# Patient Record
Sex: Male | Born: 1935 | Race: White | Hispanic: No | Marital: Married | Smoking: Never smoker
Health system: Southern US, Community
[De-identification: ages and names within clinical notes are randomized; demographics above are authoritative.]

## PROBLEM LIST (undated history)

## (undated) DIAGNOSIS — Z9889 Other specified postprocedural states: Secondary | ICD-10-CM

## (undated) DIAGNOSIS — G250 Essential tremor: Secondary | ICD-10-CM

## (undated) DIAGNOSIS — C61 Malignant neoplasm of prostate: Secondary | ICD-10-CM

## (undated) DIAGNOSIS — I10 Essential (primary) hypertension: Secondary | ICD-10-CM

## (undated) DIAGNOSIS — S7290XA Unspecified fracture of unspecified femur, initial encounter for closed fracture: Secondary | ICD-10-CM

## (undated) DIAGNOSIS — Z9289 Personal history of other medical treatment: Secondary | ICD-10-CM

## (undated) DIAGNOSIS — E7800 Pure hypercholesterolemia, unspecified: Secondary | ICD-10-CM

## (undated) DIAGNOSIS — G629 Polyneuropathy, unspecified: Secondary | ICD-10-CM

## (undated) DIAGNOSIS — I219 Acute myocardial infarction, unspecified: Secondary | ICD-10-CM

## (undated) DIAGNOSIS — M1990 Unspecified osteoarthritis, unspecified site: Secondary | ICD-10-CM

## (undated) DIAGNOSIS — K219 Gastro-esophageal reflux disease without esophagitis: Secondary | ICD-10-CM

## (undated) DIAGNOSIS — T148XXA Other injury of unspecified body region, initial encounter: Secondary | ICD-10-CM

## (undated) HISTORY — DX: Essential tremor: G25.0

## (undated) HISTORY — PX: BASAL CELL CARCINOMA EXCISION: SHX1214

## (undated) HISTORY — DX: Polyneuropathy, unspecified: G62.9

## (undated) HISTORY — DX: Gastro-esophageal reflux disease without esophagitis: K21.9

## (undated) HISTORY — DX: Other specified postprocedural states: Z98.890

## (undated) HISTORY — DX: Unspecified osteoarthritis, unspecified site: M19.90

## (undated) HISTORY — DX: Pure hypercholesterolemia, unspecified: E78.00

## (undated) HISTORY — DX: Other injury of unspecified body region, initial encounter: T14.8XXA

## (undated) HISTORY — DX: Unspecified fracture of unspecified femur, initial encounter for closed fracture: S72.90XA

## (undated) HISTORY — DX: Personal history of other medical treatment: Z92.89

## (undated) HISTORY — DX: Malignant neoplasm of prostate: C61

---

## 1985-06-13 HISTORY — PX: CORONARY ARTERY BYPASS GRAFT: SHX141

## 1988-06-13 HISTORY — PX: HERNIA REPAIR: SHX51

## 2002-06-13 DIAGNOSIS — C61 Malignant neoplasm of prostate: Secondary | ICD-10-CM

## 2002-06-13 HISTORY — PX: PROSTATE SURGERY: SHX751

## 2002-06-13 HISTORY — DX: Malignant neoplasm of prostate: C61

## 2006-09-25 ENCOUNTER — Other Ambulatory Visit: Payer: Self-pay

## 2006-09-25 ENCOUNTER — Ambulatory Visit: Payer: Self-pay | Admitting: Podiatry

## 2006-09-27 ENCOUNTER — Ambulatory Visit: Payer: Self-pay | Admitting: Podiatry

## 2006-12-09 ENCOUNTER — Other Ambulatory Visit: Payer: Self-pay

## 2006-12-09 ENCOUNTER — Emergency Department: Payer: Self-pay | Admitting: Emergency Medicine

## 2009-05-01 ENCOUNTER — Ambulatory Visit: Payer: Self-pay | Admitting: General Surgery

## 2009-05-01 HISTORY — PX: COLONOSCOPY: SHX174

## 2010-06-13 DIAGNOSIS — S7290XA Unspecified fracture of unspecified femur, initial encounter for closed fracture: Secondary | ICD-10-CM

## 2010-06-13 HISTORY — DX: Unspecified fracture of unspecified femur, initial encounter for closed fracture: S72.90XA

## 2010-09-28 ENCOUNTER — Ambulatory Visit: Payer: Self-pay | Admitting: Internal Medicine

## 2011-06-14 DIAGNOSIS — R262 Difficulty in walking, not elsewhere classified: Secondary | ICD-10-CM | POA: Diagnosis not present

## 2011-06-14 DIAGNOSIS — M216X9 Other acquired deformities of unspecified foot: Secondary | ICD-10-CM | POA: Diagnosis not present

## 2011-06-16 DIAGNOSIS — R262 Difficulty in walking, not elsewhere classified: Secondary | ICD-10-CM | POA: Diagnosis not present

## 2011-06-16 DIAGNOSIS — M216X9 Other acquired deformities of unspecified foot: Secondary | ICD-10-CM | POA: Diagnosis not present

## 2011-06-20 DIAGNOSIS — R262 Difficulty in walking, not elsewhere classified: Secondary | ICD-10-CM | POA: Diagnosis not present

## 2011-06-20 DIAGNOSIS — M216X9 Other acquired deformities of unspecified foot: Secondary | ICD-10-CM | POA: Diagnosis not present

## 2011-06-22 DIAGNOSIS — R262 Difficulty in walking, not elsewhere classified: Secondary | ICD-10-CM | POA: Diagnosis not present

## 2011-06-22 DIAGNOSIS — M216X9 Other acquired deformities of unspecified foot: Secondary | ICD-10-CM | POA: Diagnosis not present

## 2011-06-29 DIAGNOSIS — M216X9 Other acquired deformities of unspecified foot: Secondary | ICD-10-CM | POA: Diagnosis not present

## 2011-06-29 DIAGNOSIS — R262 Difficulty in walking, not elsewhere classified: Secondary | ICD-10-CM | POA: Diagnosis not present

## 2011-07-01 DIAGNOSIS — M216X9 Other acquired deformities of unspecified foot: Secondary | ICD-10-CM | POA: Diagnosis not present

## 2011-07-01 DIAGNOSIS — R262 Difficulty in walking, not elsewhere classified: Secondary | ICD-10-CM | POA: Diagnosis not present

## 2011-07-04 DIAGNOSIS — R262 Difficulty in walking, not elsewhere classified: Secondary | ICD-10-CM | POA: Diagnosis not present

## 2011-07-04 DIAGNOSIS — M216X9 Other acquired deformities of unspecified foot: Secondary | ICD-10-CM | POA: Diagnosis not present

## 2011-07-08 DIAGNOSIS — M216X9 Other acquired deformities of unspecified foot: Secondary | ICD-10-CM | POA: Diagnosis not present

## 2011-07-08 DIAGNOSIS — R262 Difficulty in walking, not elsewhere classified: Secondary | ICD-10-CM | POA: Diagnosis not present

## 2011-07-15 DIAGNOSIS — M216X9 Other acquired deformities of unspecified foot: Secondary | ICD-10-CM | POA: Diagnosis not present

## 2011-07-15 DIAGNOSIS — R262 Difficulty in walking, not elsewhere classified: Secondary | ICD-10-CM | POA: Diagnosis not present

## 2011-08-05 DIAGNOSIS — M216X9 Other acquired deformities of unspecified foot: Secondary | ICD-10-CM | POA: Diagnosis not present

## 2011-08-05 DIAGNOSIS — R262 Difficulty in walking, not elsewhere classified: Secondary | ICD-10-CM | POA: Diagnosis not present

## 2011-08-10 DIAGNOSIS — L570 Actinic keratosis: Secondary | ICD-10-CM | POA: Diagnosis not present

## 2011-08-10 DIAGNOSIS — Z85828 Personal history of other malignant neoplasm of skin: Secondary | ICD-10-CM | POA: Diagnosis not present

## 2011-08-10 DIAGNOSIS — L821 Other seborrheic keratosis: Secondary | ICD-10-CM | POA: Diagnosis not present

## 2011-08-10 DIAGNOSIS — D485 Neoplasm of uncertain behavior of skin: Secondary | ICD-10-CM | POA: Diagnosis not present

## 2011-09-09 DIAGNOSIS — Z8781 Personal history of (healed) traumatic fracture: Secondary | ICD-10-CM | POA: Insufficient documentation

## 2011-09-09 DIAGNOSIS — M25579 Pain in unspecified ankle and joints of unspecified foot: Secondary | ICD-10-CM | POA: Diagnosis not present

## 2011-09-09 DIAGNOSIS — S93409A Sprain of unspecified ligament of unspecified ankle, initial encounter: Secondary | ICD-10-CM | POA: Insufficient documentation

## 2011-10-20 DIAGNOSIS — I10 Essential (primary) hypertension: Secondary | ICD-10-CM | POA: Diagnosis not present

## 2011-10-20 DIAGNOSIS — K3189 Other diseases of stomach and duodenum: Secondary | ICD-10-CM | POA: Diagnosis not present

## 2011-10-20 DIAGNOSIS — R1013 Epigastric pain: Secondary | ICD-10-CM | POA: Diagnosis not present

## 2011-10-20 DIAGNOSIS — E782 Mixed hyperlipidemia: Secondary | ICD-10-CM | POA: Diagnosis not present

## 2011-10-20 DIAGNOSIS — I2510 Atherosclerotic heart disease of native coronary artery without angina pectoris: Secondary | ICD-10-CM | POA: Diagnosis not present

## 2011-10-31 DIAGNOSIS — Z79899 Other long term (current) drug therapy: Secondary | ICD-10-CM | POA: Diagnosis not present

## 2011-10-31 DIAGNOSIS — G589 Mononeuropathy, unspecified: Secondary | ICD-10-CM | POA: Diagnosis not present

## 2011-10-31 DIAGNOSIS — I2510 Atherosclerotic heart disease of native coronary artery without angina pectoris: Secondary | ICD-10-CM | POA: Diagnosis not present

## 2011-10-31 DIAGNOSIS — Z0000 Encounter for general adult medical examination without abnormal findings: Secondary | ICD-10-CM | POA: Diagnosis not present

## 2011-10-31 DIAGNOSIS — C61 Malignant neoplasm of prostate: Secondary | ICD-10-CM | POA: Diagnosis not present

## 2011-11-02 DIAGNOSIS — L570 Actinic keratosis: Secondary | ICD-10-CM | POA: Diagnosis not present

## 2011-11-10 ENCOUNTER — Ambulatory Visit: Payer: Self-pay | Admitting: Oncology

## 2011-12-07 ENCOUNTER — Ambulatory Visit: Payer: Self-pay | Admitting: Oncology

## 2011-12-07 DIAGNOSIS — Z951 Presence of aortocoronary bypass graft: Secondary | ICD-10-CM | POA: Diagnosis not present

## 2011-12-07 DIAGNOSIS — G589 Mononeuropathy, unspecified: Secondary | ICD-10-CM | POA: Diagnosis not present

## 2011-12-07 DIAGNOSIS — I2510 Atherosclerotic heart disease of native coronary artery without angina pectoris: Secondary | ICD-10-CM | POA: Diagnosis not present

## 2011-12-07 DIAGNOSIS — Z79899 Other long term (current) drug therapy: Secondary | ICD-10-CM | POA: Diagnosis not present

## 2011-12-07 DIAGNOSIS — D649 Anemia, unspecified: Secondary | ICD-10-CM | POA: Diagnosis not present

## 2011-12-07 DIAGNOSIS — Z8546 Personal history of malignant neoplasm of prostate: Secondary | ICD-10-CM | POA: Diagnosis not present

## 2011-12-07 DIAGNOSIS — E7800 Pure hypercholesterolemia, unspecified: Secondary | ICD-10-CM | POA: Diagnosis not present

## 2011-12-07 DIAGNOSIS — D72819 Decreased white blood cell count, unspecified: Secondary | ICD-10-CM | POA: Diagnosis not present

## 2011-12-07 DIAGNOSIS — Z7982 Long term (current) use of aspirin: Secondary | ICD-10-CM | POA: Diagnosis not present

## 2011-12-07 DIAGNOSIS — I10 Essential (primary) hypertension: Secondary | ICD-10-CM | POA: Diagnosis not present

## 2011-12-07 LAB — CBC CANCER CENTER
Basophil #: 0 x10 3/mm (ref 0.0–0.1)
Basophil %: 0.9 %
Eosinophil #: 0.1 x10 3/mm (ref 0.0–0.7)
HCT: 31.3 % — ABNORMAL LOW (ref 40.0–52.0)
Lymphocyte #: 1 x10 3/mm (ref 1.0–3.6)
MCH: 36.7 pg — ABNORMAL HIGH (ref 26.0–34.0)
MCV: 107 fL — ABNORMAL HIGH (ref 80–100)
Monocyte #: 0.4 x10 3/mm (ref 0.2–1.0)
Neutrophil #: 1.6 x10 3/mm (ref 1.4–6.5)
Neutrophil %: 52.4 %
RDW: 14.7 % — ABNORMAL HIGH (ref 11.5–14.5)
WBC: 3.1 x10 3/mm — ABNORMAL LOW (ref 3.8–10.6)

## 2011-12-07 LAB — LACTATE DEHYDROGENASE: LDH: 125 U/L (ref 87–241)

## 2011-12-07 LAB — RETICULOCYTES
Absolute Retic Count: 0.075 10*6/uL (ref 0.024–0.084)
Reticulocyte: 2.6 % — ABNORMAL HIGH (ref 0.5–1.5)

## 2011-12-07 LAB — SEDIMENTATION RATE: Erythrocyte Sed Rate: 29 mm/hr — ABNORMAL HIGH (ref 0–20)

## 2011-12-08 LAB — PROT IMMUNOELECTROPHORES(ARMC)

## 2011-12-12 ENCOUNTER — Ambulatory Visit: Payer: Self-pay | Admitting: Oncology

## 2011-12-12 DIAGNOSIS — R259 Unspecified abnormal involuntary movements: Secondary | ICD-10-CM | POA: Diagnosis not present

## 2011-12-12 DIAGNOSIS — G589 Mononeuropathy, unspecified: Secondary | ICD-10-CM | POA: Diagnosis not present

## 2011-12-12 DIAGNOSIS — D72819 Decreased white blood cell count, unspecified: Secondary | ICD-10-CM | POA: Diagnosis not present

## 2011-12-12 DIAGNOSIS — Z79899 Other long term (current) drug therapy: Secondary | ICD-10-CM | POA: Diagnosis not present

## 2011-12-12 DIAGNOSIS — E7800 Pure hypercholesterolemia, unspecified: Secondary | ICD-10-CM | POA: Diagnosis not present

## 2011-12-12 DIAGNOSIS — Z951 Presence of aortocoronary bypass graft: Secondary | ICD-10-CM | POA: Diagnosis not present

## 2011-12-12 DIAGNOSIS — D649 Anemia, unspecified: Secondary | ICD-10-CM | POA: Diagnosis not present

## 2011-12-12 DIAGNOSIS — I10 Essential (primary) hypertension: Secondary | ICD-10-CM | POA: Diagnosis not present

## 2011-12-12 DIAGNOSIS — Z8546 Personal history of malignant neoplasm of prostate: Secondary | ICD-10-CM | POA: Diagnosis not present

## 2011-12-12 DIAGNOSIS — I2510 Atherosclerotic heart disease of native coronary artery without angina pectoris: Secondary | ICD-10-CM | POA: Diagnosis not present

## 2011-12-12 DIAGNOSIS — Z7982 Long term (current) use of aspirin: Secondary | ICD-10-CM | POA: Diagnosis not present

## 2012-01-04 DIAGNOSIS — D72819 Decreased white blood cell count, unspecified: Secondary | ICD-10-CM | POA: Diagnosis not present

## 2012-01-04 DIAGNOSIS — D649 Anemia, unspecified: Secondary | ICD-10-CM | POA: Diagnosis not present

## 2012-01-04 LAB — CBC CANCER CENTER
Basophil #: 0 x10 3/mm (ref 0.0–0.1)
Basophil %: 0.6 %
Eosinophil #: 0.1 x10 3/mm (ref 0.0–0.7)
Eosinophil %: 2.7 %
HCT: 34.5 % — ABNORMAL LOW (ref 40.0–52.0)
HGB: 11.9 g/dL — ABNORMAL LOW (ref 13.0–18.0)
Lymphocyte #: 1.2 x10 3/mm (ref 1.0–3.6)
Lymphocyte %: 31.8 %
MCH: 36.8 pg — ABNORMAL HIGH (ref 26.0–34.0)
MCHC: 34.5 g/dL (ref 32.0–36.0)
MCV: 107 fL — ABNORMAL HIGH (ref 80–100)
Monocyte #: 0.4 x10 3/mm (ref 0.2–1.0)
Neutrophil #: 2 x10 3/mm (ref 1.4–6.5)
RDW: 13.2 % (ref 11.5–14.5)
WBC: 3.6 x10 3/mm — ABNORMAL LOW (ref 3.8–10.6)

## 2012-01-12 ENCOUNTER — Ambulatory Visit: Payer: Self-pay | Admitting: Oncology

## 2012-01-26 DIAGNOSIS — L820 Inflamed seborrheic keratosis: Secondary | ICD-10-CM | POA: Diagnosis not present

## 2012-01-26 DIAGNOSIS — D485 Neoplasm of uncertain behavior of skin: Secondary | ICD-10-CM | POA: Diagnosis not present

## 2012-01-26 DIAGNOSIS — Z85828 Personal history of other malignant neoplasm of skin: Secondary | ICD-10-CM | POA: Diagnosis not present

## 2012-01-26 DIAGNOSIS — D235 Other benign neoplasm of skin of trunk: Secondary | ICD-10-CM | POA: Diagnosis not present

## 2012-01-26 DIAGNOSIS — L570 Actinic keratosis: Secondary | ICD-10-CM | POA: Diagnosis not present

## 2012-02-01 DIAGNOSIS — H2510 Age-related nuclear cataract, unspecified eye: Secondary | ICD-10-CM | POA: Diagnosis not present

## 2012-03-06 DIAGNOSIS — Z23 Encounter for immunization: Secondary | ICD-10-CM | POA: Diagnosis not present

## 2012-04-05 DIAGNOSIS — L570 Actinic keratosis: Secondary | ICD-10-CM | POA: Diagnosis not present

## 2012-04-05 DIAGNOSIS — Z85828 Personal history of other malignant neoplasm of skin: Secondary | ICD-10-CM | POA: Diagnosis not present

## 2012-04-06 ENCOUNTER — Ambulatory Visit: Payer: Self-pay | Admitting: Internal Medicine

## 2012-04-06 DIAGNOSIS — M79609 Pain in unspecified limb: Secondary | ICD-10-CM | POA: Diagnosis not present

## 2012-04-06 DIAGNOSIS — M7730 Calcaneal spur, unspecified foot: Secondary | ICD-10-CM | POA: Diagnosis not present

## 2012-04-27 DIAGNOSIS — R5381 Other malaise: Secondary | ICD-10-CM | POA: Diagnosis not present

## 2012-04-27 DIAGNOSIS — R0989 Other specified symptoms and signs involving the circulatory and respiratory systems: Secondary | ICD-10-CM | POA: Diagnosis not present

## 2012-04-27 DIAGNOSIS — J019 Acute sinusitis, unspecified: Secondary | ICD-10-CM | POA: Diagnosis not present

## 2012-04-30 DIAGNOSIS — G589 Mononeuropathy, unspecified: Secondary | ICD-10-CM | POA: Diagnosis not present

## 2012-04-30 DIAGNOSIS — J209 Acute bronchitis, unspecified: Secondary | ICD-10-CM | POA: Diagnosis not present

## 2012-04-30 DIAGNOSIS — R259 Unspecified abnormal involuntary movements: Secondary | ICD-10-CM | POA: Diagnosis not present

## 2012-05-08 DIAGNOSIS — E782 Mixed hyperlipidemia: Secondary | ICD-10-CM | POA: Diagnosis not present

## 2012-05-08 DIAGNOSIS — R9430 Abnormal result of cardiovascular function study, unspecified: Secondary | ICD-10-CM | POA: Diagnosis not present

## 2012-05-08 DIAGNOSIS — I2510 Atherosclerotic heart disease of native coronary artery without angina pectoris: Secondary | ICD-10-CM | POA: Diagnosis not present

## 2012-05-08 DIAGNOSIS — I059 Rheumatic mitral valve disease, unspecified: Secondary | ICD-10-CM | POA: Diagnosis not present

## 2012-05-16 DIAGNOSIS — J40 Bronchitis, not specified as acute or chronic: Secondary | ICD-10-CM | POA: Diagnosis not present

## 2012-05-16 DIAGNOSIS — R0982 Postnasal drip: Secondary | ICD-10-CM | POA: Diagnosis not present

## 2012-05-22 DIAGNOSIS — H9319 Tinnitus, unspecified ear: Secondary | ICD-10-CM | POA: Diagnosis not present

## 2012-05-22 DIAGNOSIS — H903 Sensorineural hearing loss, bilateral: Secondary | ICD-10-CM | POA: Diagnosis not present

## 2012-05-29 DIAGNOSIS — M25559 Pain in unspecified hip: Secondary | ICD-10-CM | POA: Diagnosis not present

## 2012-05-29 DIAGNOSIS — M5137 Other intervertebral disc degeneration, lumbosacral region: Secondary | ICD-10-CM | POA: Diagnosis not present

## 2012-05-29 DIAGNOSIS — M999 Biomechanical lesion, unspecified: Secondary | ICD-10-CM | POA: Diagnosis not present

## 2012-06-11 DIAGNOSIS — I209 Angina pectoris, unspecified: Secondary | ICD-10-CM | POA: Diagnosis not present

## 2012-08-13 DIAGNOSIS — G589 Mononeuropathy, unspecified: Secondary | ICD-10-CM | POA: Diagnosis not present

## 2012-08-13 DIAGNOSIS — D649 Anemia, unspecified: Secondary | ICD-10-CM | POA: Diagnosis not present

## 2012-08-13 DIAGNOSIS — R7989 Other specified abnormal findings of blood chemistry: Secondary | ICD-10-CM | POA: Diagnosis not present

## 2012-08-13 DIAGNOSIS — R5381 Other malaise: Secondary | ICD-10-CM | POA: Diagnosis not present

## 2012-10-16 DIAGNOSIS — D235 Other benign neoplasm of skin of trunk: Secondary | ICD-10-CM | POA: Diagnosis not present

## 2012-10-16 DIAGNOSIS — Z85828 Personal history of other malignant neoplasm of skin: Secondary | ICD-10-CM | POA: Diagnosis not present

## 2012-10-16 DIAGNOSIS — L570 Actinic keratosis: Secondary | ICD-10-CM | POA: Diagnosis not present

## 2012-10-16 DIAGNOSIS — L820 Inflamed seborrheic keratosis: Secondary | ICD-10-CM | POA: Diagnosis not present

## 2012-10-17 DIAGNOSIS — I2510 Atherosclerotic heart disease of native coronary artery without angina pectoris: Secondary | ICD-10-CM | POA: Diagnosis not present

## 2012-10-17 DIAGNOSIS — I119 Hypertensive heart disease without heart failure: Secondary | ICD-10-CM | POA: Diagnosis not present

## 2012-10-17 DIAGNOSIS — E782 Mixed hyperlipidemia: Secondary | ICD-10-CM | POA: Diagnosis not present

## 2012-10-18 DIAGNOSIS — M461 Sacroiliitis, not elsewhere classified: Secondary | ICD-10-CM | POA: Diagnosis not present

## 2012-10-31 DIAGNOSIS — E785 Hyperlipidemia, unspecified: Secondary | ICD-10-CM | POA: Diagnosis not present

## 2012-10-31 DIAGNOSIS — M216X9 Other acquired deformities of unspecified foot: Secondary | ICD-10-CM | POA: Diagnosis not present

## 2012-10-31 DIAGNOSIS — G609 Hereditary and idiopathic neuropathy, unspecified: Secondary | ICD-10-CM | POA: Diagnosis not present

## 2012-10-31 DIAGNOSIS — C61 Malignant neoplasm of prostate: Secondary | ICD-10-CM | POA: Diagnosis not present

## 2012-10-31 DIAGNOSIS — K219 Gastro-esophageal reflux disease without esophagitis: Secondary | ICD-10-CM | POA: Diagnosis not present

## 2012-10-31 DIAGNOSIS — Z0000 Encounter for general adult medical examination without abnormal findings: Secondary | ICD-10-CM | POA: Diagnosis not present

## 2012-10-31 DIAGNOSIS — G589 Mononeuropathy, unspecified: Secondary | ICD-10-CM | POA: Diagnosis not present

## 2012-10-31 DIAGNOSIS — I10 Essential (primary) hypertension: Secondary | ICD-10-CM | POA: Diagnosis not present

## 2012-12-04 DIAGNOSIS — M216X9 Other acquired deformities of unspecified foot: Secondary | ICD-10-CM | POA: Diagnosis not present

## 2013-03-04 DIAGNOSIS — D485 Neoplasm of uncertain behavior of skin: Secondary | ICD-10-CM | POA: Diagnosis not present

## 2013-03-04 DIAGNOSIS — L570 Actinic keratosis: Secondary | ICD-10-CM | POA: Diagnosis not present

## 2013-03-04 DIAGNOSIS — Z85828 Personal history of other malignant neoplasm of skin: Secondary | ICD-10-CM | POA: Diagnosis not present

## 2013-03-04 DIAGNOSIS — C44319 Basal cell carcinoma of skin of other parts of face: Secondary | ICD-10-CM | POA: Diagnosis not present

## 2013-03-04 DIAGNOSIS — L821 Other seborrheic keratosis: Secondary | ICD-10-CM | POA: Diagnosis not present

## 2013-03-04 DIAGNOSIS — L820 Inflamed seborrheic keratosis: Secondary | ICD-10-CM | POA: Diagnosis not present

## 2013-03-05 DIAGNOSIS — Z23 Encounter for immunization: Secondary | ICD-10-CM | POA: Diagnosis not present

## 2013-03-13 DIAGNOSIS — H2510 Age-related nuclear cataract, unspecified eye: Secondary | ICD-10-CM | POA: Diagnosis not present

## 2013-03-14 DIAGNOSIS — C44319 Basal cell carcinoma of skin of other parts of face: Secondary | ICD-10-CM | POA: Diagnosis not present

## 2013-03-14 DIAGNOSIS — L570 Actinic keratosis: Secondary | ICD-10-CM | POA: Diagnosis not present

## 2013-03-27 DIAGNOSIS — Z7189 Other specified counseling: Secondary | ICD-10-CM | POA: Diagnosis not present

## 2013-03-27 DIAGNOSIS — E7800 Pure hypercholesterolemia, unspecified: Secondary | ICD-10-CM | POA: Diagnosis not present

## 2013-03-27 DIAGNOSIS — I519 Heart disease, unspecified: Secondary | ICD-10-CM | POA: Diagnosis not present

## 2013-03-27 DIAGNOSIS — K219 Gastro-esophageal reflux disease without esophagitis: Secondary | ICD-10-CM | POA: Diagnosis not present

## 2013-05-08 DIAGNOSIS — I2510 Atherosclerotic heart disease of native coronary artery without angina pectoris: Secondary | ICD-10-CM | POA: Diagnosis not present

## 2013-05-08 DIAGNOSIS — E782 Mixed hyperlipidemia: Secondary | ICD-10-CM | POA: Diagnosis not present

## 2013-05-08 DIAGNOSIS — I059 Rheumatic mitral valve disease, unspecified: Secondary | ICD-10-CM | POA: Diagnosis not present

## 2013-05-08 DIAGNOSIS — I517 Cardiomegaly: Secondary | ICD-10-CM | POA: Diagnosis not present

## 2013-05-16 DIAGNOSIS — I25810 Atherosclerosis of coronary artery bypass graft(s) without angina pectoris: Secondary | ICD-10-CM | POA: Diagnosis not present

## 2013-05-16 DIAGNOSIS — R0602 Shortness of breath: Secondary | ICD-10-CM | POA: Diagnosis not present

## 2013-05-16 DIAGNOSIS — I38 Endocarditis, valve unspecified: Secondary | ICD-10-CM | POA: Diagnosis not present

## 2013-05-16 DIAGNOSIS — I519 Heart disease, unspecified: Secondary | ICD-10-CM | POA: Diagnosis not present

## 2013-05-21 DIAGNOSIS — I519 Heart disease, unspecified: Secondary | ICD-10-CM | POA: Diagnosis not present

## 2013-05-21 DIAGNOSIS — M129 Arthropathy, unspecified: Secondary | ICD-10-CM | POA: Diagnosis not present

## 2013-05-21 DIAGNOSIS — K219 Gastro-esophageal reflux disease without esophagitis: Secondary | ICD-10-CM | POA: Diagnosis not present

## 2013-05-21 DIAGNOSIS — E7800 Pure hypercholesterolemia, unspecified: Secondary | ICD-10-CM | POA: Diagnosis not present

## 2013-05-31 DIAGNOSIS — R109 Unspecified abdominal pain: Secondary | ICD-10-CM | POA: Diagnosis not present

## 2013-05-31 DIAGNOSIS — N390 Urinary tract infection, site not specified: Secondary | ICD-10-CM | POA: Diagnosis not present

## 2013-05-31 DIAGNOSIS — J111 Influenza due to unidentified influenza virus with other respiratory manifestations: Secondary | ICD-10-CM | POA: Diagnosis not present

## 2013-06-15 DIAGNOSIS — M79609 Pain in unspecified limb: Secondary | ICD-10-CM | POA: Diagnosis not present

## 2013-06-15 DIAGNOSIS — Z951 Presence of aortocoronary bypass graft: Secondary | ICD-10-CM | POA: Diagnosis not present

## 2013-06-15 DIAGNOSIS — I749 Embolism and thrombosis of unspecified artery: Secondary | ICD-10-CM | POA: Diagnosis not present

## 2013-06-15 DIAGNOSIS — I2510 Atherosclerotic heart disease of native coronary artery without angina pectoris: Secondary | ICD-10-CM | POA: Diagnosis not present

## 2013-06-15 DIAGNOSIS — I10 Essential (primary) hypertension: Secondary | ICD-10-CM | POA: Diagnosis not present

## 2013-06-15 DIAGNOSIS — IMO0002 Reserved for concepts with insufficient information to code with codable children: Secondary | ICD-10-CM | POA: Diagnosis not present

## 2013-06-19 DIAGNOSIS — M1710 Unilateral primary osteoarthritis, unspecified knee: Secondary | ICD-10-CM | POA: Diagnosis not present

## 2013-06-19 DIAGNOSIS — M7120 Synovial cyst of popliteal space [Baker], unspecified knee: Secondary | ICD-10-CM | POA: Diagnosis not present

## 2013-06-19 DIAGNOSIS — M2240 Chondromalacia patellae, unspecified knee: Secondary | ICD-10-CM | POA: Diagnosis not present

## 2013-06-19 DIAGNOSIS — M25469 Effusion, unspecified knee: Secondary | ICD-10-CM | POA: Diagnosis not present

## 2013-08-05 DIAGNOSIS — C44611 Basal cell carcinoma of skin of unspecified upper limb, including shoulder: Secondary | ICD-10-CM | POA: Diagnosis not present

## 2013-08-05 DIAGNOSIS — C4491 Basal cell carcinoma of skin, unspecified: Secondary | ICD-10-CM | POA: Diagnosis not present

## 2013-08-05 DIAGNOSIS — L821 Other seborrheic keratosis: Secondary | ICD-10-CM | POA: Diagnosis not present

## 2013-08-05 DIAGNOSIS — C44319 Basal cell carcinoma of skin of other parts of face: Secondary | ICD-10-CM | POA: Diagnosis not present

## 2013-08-05 DIAGNOSIS — L570 Actinic keratosis: Secondary | ICD-10-CM | POA: Diagnosis not present

## 2013-08-05 DIAGNOSIS — Z85828 Personal history of other malignant neoplasm of skin: Secondary | ICD-10-CM | POA: Diagnosis not present

## 2013-08-05 DIAGNOSIS — D485 Neoplasm of uncertain behavior of skin: Secondary | ICD-10-CM | POA: Diagnosis not present

## 2013-08-13 DIAGNOSIS — R262 Difficulty in walking, not elsewhere classified: Secondary | ICD-10-CM | POA: Diagnosis not present

## 2013-08-15 DIAGNOSIS — R262 Difficulty in walking, not elsewhere classified: Secondary | ICD-10-CM | POA: Diagnosis not present

## 2013-08-16 DIAGNOSIS — R262 Difficulty in walking, not elsewhere classified: Secondary | ICD-10-CM | POA: Diagnosis not present

## 2013-08-18 DIAGNOSIS — J189 Pneumonia, unspecified organism: Secondary | ICD-10-CM | POA: Diagnosis not present

## 2013-08-18 DIAGNOSIS — Z951 Presence of aortocoronary bypass graft: Secondary | ICD-10-CM | POA: Diagnosis not present

## 2013-08-18 DIAGNOSIS — I2510 Atherosclerotic heart disease of native coronary artery without angina pectoris: Secondary | ICD-10-CM | POA: Diagnosis not present

## 2013-08-18 DIAGNOSIS — I10 Essential (primary) hypertension: Secondary | ICD-10-CM | POA: Diagnosis not present

## 2013-08-18 DIAGNOSIS — R0602 Shortness of breath: Secondary | ICD-10-CM | POA: Diagnosis not present

## 2013-08-22 DIAGNOSIS — R262 Difficulty in walking, not elsewhere classified: Secondary | ICD-10-CM | POA: Diagnosis not present

## 2013-08-27 DIAGNOSIS — R262 Difficulty in walking, not elsewhere classified: Secondary | ICD-10-CM | POA: Diagnosis not present

## 2013-08-29 DIAGNOSIS — R262 Difficulty in walking, not elsewhere classified: Secondary | ICD-10-CM | POA: Diagnosis not present

## 2013-08-30 DIAGNOSIS — R262 Difficulty in walking, not elsewhere classified: Secondary | ICD-10-CM | POA: Diagnosis not present

## 2013-09-11 DIAGNOSIS — R262 Difficulty in walking, not elsewhere classified: Secondary | ICD-10-CM | POA: Diagnosis not present

## 2013-09-11 DIAGNOSIS — M216X9 Other acquired deformities of unspecified foot: Secondary | ICD-10-CM | POA: Diagnosis not present

## 2013-09-11 DIAGNOSIS — G9009 Other idiopathic peripheral autonomic neuropathy: Secondary | ICD-10-CM | POA: Diagnosis not present

## 2013-09-12 ENCOUNTER — Ambulatory Visit: Payer: Self-pay | Admitting: Emergency Medicine

## 2013-09-12 ENCOUNTER — Ambulatory Visit: Payer: Self-pay

## 2013-09-12 DIAGNOSIS — Z79899 Other long term (current) drug therapy: Secondary | ICD-10-CM | POA: Diagnosis not present

## 2013-09-12 DIAGNOSIS — S99929A Unspecified injury of unspecified foot, initial encounter: Secondary | ICD-10-CM | POA: Diagnosis not present

## 2013-09-12 DIAGNOSIS — I252 Old myocardial infarction: Secondary | ICD-10-CM | POA: Diagnosis not present

## 2013-09-12 DIAGNOSIS — E7800 Pure hypercholesterolemia, unspecified: Secondary | ICD-10-CM | POA: Diagnosis not present

## 2013-09-12 DIAGNOSIS — D649 Anemia, unspecified: Secondary | ICD-10-CM | POA: Diagnosis not present

## 2013-09-12 DIAGNOSIS — Z7982 Long term (current) use of aspirin: Secondary | ICD-10-CM | POA: Diagnosis not present

## 2013-09-12 DIAGNOSIS — M7989 Other specified soft tissue disorders: Secondary | ICD-10-CM | POA: Diagnosis not present

## 2013-09-12 DIAGNOSIS — R609 Edema, unspecified: Secondary | ICD-10-CM | POA: Diagnosis not present

## 2013-09-12 DIAGNOSIS — M7120 Synovial cyst of popliteal space [Baker], unspecified knee: Secondary | ICD-10-CM | POA: Diagnosis not present

## 2013-09-12 DIAGNOSIS — M79609 Pain in unspecified limb: Secondary | ICD-10-CM | POA: Diagnosis not present

## 2013-09-12 DIAGNOSIS — Z951 Presence of aortocoronary bypass graft: Secondary | ICD-10-CM | POA: Diagnosis not present

## 2013-09-12 DIAGNOSIS — S8990XA Unspecified injury of unspecified lower leg, initial encounter: Secondary | ICD-10-CM | POA: Diagnosis not present

## 2013-09-16 ENCOUNTER — Ambulatory Visit: Payer: Self-pay | Admitting: Family Medicine

## 2013-09-16 DIAGNOSIS — IMO0002 Reserved for concepts with insufficient information to code with codable children: Secondary | ICD-10-CM | POA: Diagnosis not present

## 2013-09-16 DIAGNOSIS — M25569 Pain in unspecified knee: Secondary | ICD-10-CM | POA: Diagnosis not present

## 2013-09-16 DIAGNOSIS — M1710 Unilateral primary osteoarthritis, unspecified knee: Secondary | ICD-10-CM | POA: Diagnosis not present

## 2013-09-18 DIAGNOSIS — E7800 Pure hypercholesterolemia, unspecified: Secondary | ICD-10-CM | POA: Diagnosis not present

## 2013-09-18 DIAGNOSIS — K219 Gastro-esophageal reflux disease without esophagitis: Secondary | ICD-10-CM | POA: Diagnosis not present

## 2013-09-18 DIAGNOSIS — M129 Arthropathy, unspecified: Secondary | ICD-10-CM | POA: Diagnosis not present

## 2013-09-18 DIAGNOSIS — I519 Heart disease, unspecified: Secondary | ICD-10-CM | POA: Diagnosis not present

## 2013-09-23 DIAGNOSIS — C44711 Basal cell carcinoma of skin of unspecified lower limb, including hip: Secondary | ICD-10-CM | POA: Diagnosis not present

## 2013-09-23 DIAGNOSIS — C44611 Basal cell carcinoma of skin of unspecified upper limb, including shoulder: Secondary | ICD-10-CM | POA: Diagnosis not present

## 2013-09-25 ENCOUNTER — Ambulatory Visit: Payer: Self-pay | Admitting: Family Medicine

## 2013-09-25 DIAGNOSIS — S8010XA Contusion of unspecified lower leg, initial encounter: Secondary | ICD-10-CM | POA: Diagnosis not present

## 2013-09-25 DIAGNOSIS — M7120 Synovial cyst of popliteal space [Baker], unspecified knee: Secondary | ICD-10-CM | POA: Diagnosis not present

## 2013-09-25 DIAGNOSIS — M7989 Other specified soft tissue disorders: Secondary | ICD-10-CM | POA: Diagnosis not present

## 2013-10-02 DIAGNOSIS — M79609 Pain in unspecified limb: Secondary | ICD-10-CM | POA: Diagnosis not present

## 2013-10-02 DIAGNOSIS — G8929 Other chronic pain: Secondary | ICD-10-CM | POA: Diagnosis not present

## 2013-10-02 DIAGNOSIS — M129 Arthropathy, unspecified: Secondary | ICD-10-CM | POA: Diagnosis not present

## 2013-10-02 DIAGNOSIS — S8010XA Contusion of unspecified lower leg, initial encounter: Secondary | ICD-10-CM | POA: Diagnosis not present

## 2013-10-09 DIAGNOSIS — C44319 Basal cell carcinoma of skin of other parts of face: Secondary | ICD-10-CM | POA: Diagnosis not present

## 2013-10-09 DIAGNOSIS — L905 Scar conditions and fibrosis of skin: Secondary | ICD-10-CM | POA: Diagnosis not present

## 2013-10-09 DIAGNOSIS — Z85828 Personal history of other malignant neoplasm of skin: Secondary | ICD-10-CM | POA: Diagnosis not present

## 2013-10-11 DIAGNOSIS — G9009 Other idiopathic peripheral autonomic neuropathy: Secondary | ICD-10-CM | POA: Diagnosis not present

## 2013-10-11 DIAGNOSIS — R262 Difficulty in walking, not elsewhere classified: Secondary | ICD-10-CM | POA: Diagnosis not present

## 2013-10-11 DIAGNOSIS — M216X9 Other acquired deformities of unspecified foot: Secondary | ICD-10-CM | POA: Diagnosis not present

## 2013-10-22 DIAGNOSIS — G9009 Other idiopathic peripheral autonomic neuropathy: Secondary | ICD-10-CM | POA: Diagnosis not present

## 2013-10-22 DIAGNOSIS — R262 Difficulty in walking, not elsewhere classified: Secondary | ICD-10-CM | POA: Diagnosis not present

## 2013-10-22 DIAGNOSIS — M216X9 Other acquired deformities of unspecified foot: Secondary | ICD-10-CM | POA: Diagnosis not present

## 2013-10-25 DIAGNOSIS — G9009 Other idiopathic peripheral autonomic neuropathy: Secondary | ICD-10-CM | POA: Diagnosis not present

## 2013-10-25 DIAGNOSIS — R262 Difficulty in walking, not elsewhere classified: Secondary | ICD-10-CM | POA: Diagnosis not present

## 2013-10-25 DIAGNOSIS — M216X9 Other acquired deformities of unspecified foot: Secondary | ICD-10-CM | POA: Diagnosis not present

## 2013-10-28 DIAGNOSIS — G9009 Other idiopathic peripheral autonomic neuropathy: Secondary | ICD-10-CM | POA: Diagnosis not present

## 2013-10-28 DIAGNOSIS — R262 Difficulty in walking, not elsewhere classified: Secondary | ICD-10-CM | POA: Diagnosis not present

## 2013-10-28 DIAGNOSIS — M216X9 Other acquired deformities of unspecified foot: Secondary | ICD-10-CM | POA: Diagnosis not present

## 2013-10-30 DIAGNOSIS — M216X9 Other acquired deformities of unspecified foot: Secondary | ICD-10-CM | POA: Diagnosis not present

## 2013-10-30 DIAGNOSIS — G9009 Other idiopathic peripheral autonomic neuropathy: Secondary | ICD-10-CM | POA: Diagnosis not present

## 2013-10-30 DIAGNOSIS — R262 Difficulty in walking, not elsewhere classified: Secondary | ICD-10-CM | POA: Diagnosis not present

## 2013-11-01 DIAGNOSIS — I2510 Atherosclerotic heart disease of native coronary artery without angina pectoris: Secondary | ICD-10-CM | POA: Insufficient documentation

## 2013-11-04 DIAGNOSIS — M216X9 Other acquired deformities of unspecified foot: Secondary | ICD-10-CM | POA: Diagnosis not present

## 2013-11-04 DIAGNOSIS — G9009 Other idiopathic peripheral autonomic neuropathy: Secondary | ICD-10-CM | POA: Diagnosis not present

## 2013-11-04 DIAGNOSIS — R262 Difficulty in walking, not elsewhere classified: Secondary | ICD-10-CM | POA: Diagnosis not present

## 2013-11-05 DIAGNOSIS — I10 Essential (primary) hypertension: Secondary | ICD-10-CM | POA: Diagnosis not present

## 2013-11-05 DIAGNOSIS — I340 Nonrheumatic mitral (valve) insufficiency: Secondary | ICD-10-CM | POA: Insufficient documentation

## 2013-11-05 DIAGNOSIS — E7800 Pure hypercholesterolemia, unspecified: Secondary | ICD-10-CM | POA: Diagnosis not present

## 2013-11-05 DIAGNOSIS — R9389 Abnormal findings on diagnostic imaging of other specified body structures: Secondary | ICD-10-CM | POA: Diagnosis not present

## 2013-11-05 DIAGNOSIS — I2510 Atherosclerotic heart disease of native coronary artery without angina pectoris: Secondary | ICD-10-CM | POA: Diagnosis not present

## 2013-12-11 DIAGNOSIS — G9009 Other idiopathic peripheral autonomic neuropathy: Secondary | ICD-10-CM | POA: Diagnosis not present

## 2013-12-11 DIAGNOSIS — M216X9 Other acquired deformities of unspecified foot: Secondary | ICD-10-CM | POA: Diagnosis not present

## 2013-12-11 DIAGNOSIS — R262 Difficulty in walking, not elsewhere classified: Secondary | ICD-10-CM | POA: Diagnosis not present

## 2013-12-23 DIAGNOSIS — D485 Neoplasm of uncertain behavior of skin: Secondary | ICD-10-CM | POA: Diagnosis not present

## 2013-12-23 DIAGNOSIS — L570 Actinic keratosis: Secondary | ICD-10-CM | POA: Diagnosis not present

## 2013-12-23 DIAGNOSIS — L821 Other seborrheic keratosis: Secondary | ICD-10-CM | POA: Diagnosis not present

## 2013-12-25 DIAGNOSIS — S8010XA Contusion of unspecified lower leg, initial encounter: Secondary | ICD-10-CM | POA: Diagnosis not present

## 2013-12-25 DIAGNOSIS — G609 Hereditary and idiopathic neuropathy, unspecified: Secondary | ICD-10-CM | POA: Diagnosis not present

## 2013-12-25 DIAGNOSIS — I10 Essential (primary) hypertension: Secondary | ICD-10-CM | POA: Diagnosis not present

## 2013-12-25 DIAGNOSIS — R609 Edema, unspecified: Secondary | ICD-10-CM | POA: Diagnosis not present

## 2013-12-31 DIAGNOSIS — IMO0002 Reserved for concepts with insufficient information to code with codable children: Secondary | ICD-10-CM | POA: Diagnosis not present

## 2013-12-31 DIAGNOSIS — C61 Malignant neoplasm of prostate: Secondary | ICD-10-CM | POA: Diagnosis not present

## 2013-12-31 DIAGNOSIS — M129 Arthropathy, unspecified: Secondary | ICD-10-CM | POA: Diagnosis not present

## 2013-12-31 DIAGNOSIS — I10 Essential (primary) hypertension: Secondary | ICD-10-CM | POA: Diagnosis not present

## 2014-01-03 DIAGNOSIS — R262 Difficulty in walking, not elsewhere classified: Secondary | ICD-10-CM | POA: Diagnosis not present

## 2014-01-03 DIAGNOSIS — M216X9 Other acquired deformities of unspecified foot: Secondary | ICD-10-CM | POA: Diagnosis not present

## 2014-01-03 DIAGNOSIS — G9009 Other idiopathic peripheral autonomic neuropathy: Secondary | ICD-10-CM | POA: Diagnosis not present

## 2014-01-06 DIAGNOSIS — R262 Difficulty in walking, not elsewhere classified: Secondary | ICD-10-CM | POA: Diagnosis not present

## 2014-01-06 DIAGNOSIS — G9009 Other idiopathic peripheral autonomic neuropathy: Secondary | ICD-10-CM | POA: Diagnosis not present

## 2014-01-06 DIAGNOSIS — M216X9 Other acquired deformities of unspecified foot: Secondary | ICD-10-CM | POA: Diagnosis not present

## 2014-01-06 DIAGNOSIS — IMO0002 Reserved for concepts with insufficient information to code with codable children: Secondary | ICD-10-CM | POA: Diagnosis not present

## 2014-01-08 DIAGNOSIS — M216X9 Other acquired deformities of unspecified foot: Secondary | ICD-10-CM | POA: Diagnosis not present

## 2014-01-08 DIAGNOSIS — R262 Difficulty in walking, not elsewhere classified: Secondary | ICD-10-CM | POA: Diagnosis not present

## 2014-01-08 DIAGNOSIS — G9009 Other idiopathic peripheral autonomic neuropathy: Secondary | ICD-10-CM | POA: Diagnosis not present

## 2014-01-11 DIAGNOSIS — R262 Difficulty in walking, not elsewhere classified: Secondary | ICD-10-CM | POA: Diagnosis not present

## 2014-01-11 DIAGNOSIS — M216X9 Other acquired deformities of unspecified foot: Secondary | ICD-10-CM | POA: Diagnosis not present

## 2014-01-11 DIAGNOSIS — G9009 Other idiopathic peripheral autonomic neuropathy: Secondary | ICD-10-CM | POA: Diagnosis not present

## 2014-01-14 DIAGNOSIS — IMO0002 Reserved for concepts with insufficient information to code with codable children: Secondary | ICD-10-CM | POA: Diagnosis not present

## 2014-02-04 DIAGNOSIS — K219 Gastro-esophageal reflux disease without esophagitis: Secondary | ICD-10-CM | POA: Diagnosis not present

## 2014-02-04 DIAGNOSIS — I519 Heart disease, unspecified: Secondary | ICD-10-CM | POA: Diagnosis not present

## 2014-02-04 DIAGNOSIS — M129 Arthropathy, unspecified: Secondary | ICD-10-CM | POA: Diagnosis not present

## 2014-02-04 DIAGNOSIS — IMO0002 Reserved for concepts with insufficient information to code with codable children: Secondary | ICD-10-CM | POA: Diagnosis not present

## 2014-02-11 DIAGNOSIS — M216X9 Other acquired deformities of unspecified foot: Secondary | ICD-10-CM | POA: Diagnosis not present

## 2014-02-11 DIAGNOSIS — R262 Difficulty in walking, not elsewhere classified: Secondary | ICD-10-CM | POA: Diagnosis not present

## 2014-02-11 DIAGNOSIS — G9009 Other idiopathic peripheral autonomic neuropathy: Secondary | ICD-10-CM | POA: Diagnosis not present

## 2014-02-13 DIAGNOSIS — M216X9 Other acquired deformities of unspecified foot: Secondary | ICD-10-CM | POA: Diagnosis not present

## 2014-02-13 DIAGNOSIS — R262 Difficulty in walking, not elsewhere classified: Secondary | ICD-10-CM | POA: Diagnosis not present

## 2014-02-13 DIAGNOSIS — G9009 Other idiopathic peripheral autonomic neuropathy: Secondary | ICD-10-CM | POA: Diagnosis not present

## 2014-02-18 DIAGNOSIS — R262 Difficulty in walking, not elsewhere classified: Secondary | ICD-10-CM | POA: Diagnosis not present

## 2014-02-18 DIAGNOSIS — G9009 Other idiopathic peripheral autonomic neuropathy: Secondary | ICD-10-CM | POA: Diagnosis not present

## 2014-02-18 DIAGNOSIS — M216X9 Other acquired deformities of unspecified foot: Secondary | ICD-10-CM | POA: Diagnosis not present

## 2014-02-20 DIAGNOSIS — R262 Difficulty in walking, not elsewhere classified: Secondary | ICD-10-CM | POA: Diagnosis not present

## 2014-02-20 DIAGNOSIS — M216X9 Other acquired deformities of unspecified foot: Secondary | ICD-10-CM | POA: Diagnosis not present

## 2014-02-20 DIAGNOSIS — IMO0002 Reserved for concepts with insufficient information to code with codable children: Secondary | ICD-10-CM | POA: Diagnosis not present

## 2014-02-20 DIAGNOSIS — G9009 Other idiopathic peripheral autonomic neuropathy: Secondary | ICD-10-CM | POA: Diagnosis not present

## 2014-03-13 DIAGNOSIS — R262 Difficulty in walking, not elsewhere classified: Secondary | ICD-10-CM | POA: Diagnosis not present

## 2014-03-13 DIAGNOSIS — M21372 Foot drop, left foot: Secondary | ICD-10-CM | POA: Diagnosis not present

## 2014-03-13 DIAGNOSIS — G9009 Other idiopathic peripheral autonomic neuropathy: Secondary | ICD-10-CM | POA: Diagnosis not present

## 2014-03-13 DIAGNOSIS — M21371 Foot drop, right foot: Secondary | ICD-10-CM | POA: Diagnosis not present

## 2014-04-03 DIAGNOSIS — M21372 Foot drop, left foot: Secondary | ICD-10-CM | POA: Diagnosis not present

## 2014-04-03 DIAGNOSIS — R262 Difficulty in walking, not elsewhere classified: Secondary | ICD-10-CM | POA: Diagnosis not present

## 2014-04-03 DIAGNOSIS — G9009 Other idiopathic peripheral autonomic neuropathy: Secondary | ICD-10-CM | POA: Diagnosis not present

## 2014-04-03 DIAGNOSIS — M21371 Foot drop, right foot: Secondary | ICD-10-CM | POA: Diagnosis not present

## 2014-04-09 DIAGNOSIS — M21372 Foot drop, left foot: Secondary | ICD-10-CM | POA: Diagnosis not present

## 2014-04-09 DIAGNOSIS — G9009 Other idiopathic peripheral autonomic neuropathy: Secondary | ICD-10-CM | POA: Diagnosis not present

## 2014-04-09 DIAGNOSIS — M21371 Foot drop, right foot: Secondary | ICD-10-CM | POA: Diagnosis not present

## 2014-04-09 DIAGNOSIS — R262 Difficulty in walking, not elsewhere classified: Secondary | ICD-10-CM | POA: Diagnosis not present

## 2014-04-10 DIAGNOSIS — I519 Heart disease, unspecified: Secondary | ICD-10-CM | POA: Diagnosis not present

## 2014-04-10 DIAGNOSIS — Z23 Encounter for immunization: Secondary | ICD-10-CM | POA: Diagnosis not present

## 2014-04-10 DIAGNOSIS — M1990 Unspecified osteoarthritis, unspecified site: Secondary | ICD-10-CM | POA: Diagnosis not present

## 2014-04-10 DIAGNOSIS — Z0000 Encounter for general adult medical examination without abnormal findings: Secondary | ICD-10-CM | POA: Diagnosis not present

## 2014-04-10 DIAGNOSIS — G609 Hereditary and idiopathic neuropathy, unspecified: Secondary | ICD-10-CM | POA: Diagnosis not present

## 2014-04-10 DIAGNOSIS — C61 Malignant neoplasm of prostate: Secondary | ICD-10-CM | POA: Diagnosis not present

## 2014-04-10 DIAGNOSIS — I10 Essential (primary) hypertension: Secondary | ICD-10-CM | POA: Diagnosis not present

## 2014-04-10 DIAGNOSIS — E780 Pure hypercholesterolemia: Secondary | ICD-10-CM | POA: Diagnosis not present

## 2014-04-10 DIAGNOSIS — K219 Gastro-esophageal reflux disease without esophagitis: Secondary | ICD-10-CM | POA: Diagnosis not present

## 2014-04-11 DIAGNOSIS — I10 Essential (primary) hypertension: Secondary | ICD-10-CM | POA: Diagnosis not present

## 2014-04-11 DIAGNOSIS — R7309 Other abnormal glucose: Secondary | ICD-10-CM | POA: Diagnosis not present

## 2014-04-11 DIAGNOSIS — E780 Pure hypercholesterolemia: Secondary | ICD-10-CM | POA: Diagnosis not present

## 2014-04-11 DIAGNOSIS — K219 Gastro-esophageal reflux disease without esophagitis: Secondary | ICD-10-CM | POA: Diagnosis not present

## 2014-04-11 DIAGNOSIS — C61 Malignant neoplasm of prostate: Secondary | ICD-10-CM | POA: Diagnosis not present

## 2014-04-13 DIAGNOSIS — G9009 Other idiopathic peripheral autonomic neuropathy: Secondary | ICD-10-CM | POA: Diagnosis not present

## 2014-04-13 DIAGNOSIS — M21371 Foot drop, right foot: Secondary | ICD-10-CM | POA: Diagnosis not present

## 2014-04-13 DIAGNOSIS — M21372 Foot drop, left foot: Secondary | ICD-10-CM | POA: Diagnosis not present

## 2014-04-13 DIAGNOSIS — R262 Difficulty in walking, not elsewhere classified: Secondary | ICD-10-CM | POA: Diagnosis not present

## 2014-04-21 DIAGNOSIS — H2513 Age-related nuclear cataract, bilateral: Secondary | ICD-10-CM | POA: Diagnosis not present

## 2014-04-23 DIAGNOSIS — I2510 Atherosclerotic heart disease of native coronary artery without angina pectoris: Secondary | ICD-10-CM | POA: Diagnosis not present

## 2014-04-23 DIAGNOSIS — E780 Pure hypercholesterolemia: Secondary | ICD-10-CM | POA: Diagnosis not present

## 2014-04-23 DIAGNOSIS — I361 Nonrheumatic tricuspid (valve) insufficiency: Secondary | ICD-10-CM | POA: Diagnosis not present

## 2014-04-23 DIAGNOSIS — I10 Essential (primary) hypertension: Secondary | ICD-10-CM | POA: Diagnosis not present

## 2014-04-23 DIAGNOSIS — I5022 Chronic systolic (congestive) heart failure: Secondary | ICD-10-CM | POA: Insufficient documentation

## 2014-05-06 DIAGNOSIS — F5101 Primary insomnia: Secondary | ICD-10-CM | POA: Diagnosis not present

## 2014-05-06 DIAGNOSIS — G629 Polyneuropathy, unspecified: Secondary | ICD-10-CM | POA: Diagnosis not present

## 2014-05-06 DIAGNOSIS — I10 Essential (primary) hypertension: Secondary | ICD-10-CM | POA: Diagnosis not present

## 2014-05-06 DIAGNOSIS — E785 Hyperlipidemia, unspecified: Secondary | ICD-10-CM | POA: Diagnosis not present

## 2014-06-03 DIAGNOSIS — M21372 Foot drop, left foot: Secondary | ICD-10-CM | POA: Diagnosis not present

## 2014-06-03 DIAGNOSIS — L97512 Non-pressure chronic ulcer of other part of right foot with fat layer exposed: Secondary | ICD-10-CM | POA: Diagnosis not present

## 2014-06-20 DIAGNOSIS — L97512 Non-pressure chronic ulcer of other part of right foot with fat layer exposed: Secondary | ICD-10-CM | POA: Diagnosis not present

## 2014-07-03 DIAGNOSIS — L97512 Non-pressure chronic ulcer of other part of right foot with fat layer exposed: Secondary | ICD-10-CM | POA: Diagnosis not present

## 2014-08-14 DIAGNOSIS — M2041 Other hammer toe(s) (acquired), right foot: Secondary | ICD-10-CM | POA: Diagnosis not present

## 2014-09-29 DIAGNOSIS — G609 Hereditary and idiopathic neuropathy, unspecified: Secondary | ICD-10-CM | POA: Diagnosis not present

## 2014-09-29 DIAGNOSIS — Z1389 Encounter for screening for other disorder: Secondary | ICD-10-CM | POA: Diagnosis not present

## 2014-09-29 DIAGNOSIS — Z9181 History of falling: Secondary | ICD-10-CM | POA: Diagnosis not present

## 2014-09-29 DIAGNOSIS — I10 Essential (primary) hypertension: Secondary | ICD-10-CM | POA: Diagnosis not present

## 2014-09-29 DIAGNOSIS — I519 Heart disease, unspecified: Secondary | ICD-10-CM | POA: Diagnosis not present

## 2014-09-29 DIAGNOSIS — M1990 Unspecified osteoarthritis, unspecified site: Secondary | ICD-10-CM | POA: Diagnosis not present

## 2014-09-29 DIAGNOSIS — K219 Gastro-esophageal reflux disease without esophagitis: Secondary | ICD-10-CM | POA: Diagnosis not present

## 2014-09-29 DIAGNOSIS — E780 Pure hypercholesterolemia: Secondary | ICD-10-CM | POA: Diagnosis not present

## 2014-10-14 DIAGNOSIS — L820 Inflamed seborrheic keratosis: Secondary | ICD-10-CM | POA: Diagnosis not present

## 2014-10-14 DIAGNOSIS — Z85828 Personal history of other malignant neoplasm of skin: Secondary | ICD-10-CM | POA: Diagnosis not present

## 2014-10-14 DIAGNOSIS — L570 Actinic keratosis: Secondary | ICD-10-CM | POA: Diagnosis not present

## 2014-10-14 DIAGNOSIS — L821 Other seborrheic keratosis: Secondary | ICD-10-CM | POA: Diagnosis not present

## 2014-10-27 DIAGNOSIS — E782 Mixed hyperlipidemia: Secondary | ICD-10-CM | POA: Insufficient documentation

## 2014-10-29 DIAGNOSIS — T148 Other injury of unspecified body region: Secondary | ICD-10-CM | POA: Diagnosis not present

## 2014-10-30 DIAGNOSIS — R200 Anesthesia of skin: Secondary | ICD-10-CM | POA: Diagnosis not present

## 2014-10-30 DIAGNOSIS — G603 Idiopathic progressive neuropathy: Secondary | ICD-10-CM | POA: Diagnosis not present

## 2014-10-30 DIAGNOSIS — G250 Essential tremor: Secondary | ICD-10-CM | POA: Diagnosis not present

## 2014-11-04 DIAGNOSIS — I361 Nonrheumatic tricuspid (valve) insufficiency: Secondary | ICD-10-CM | POA: Diagnosis not present

## 2014-11-04 DIAGNOSIS — E782 Mixed hyperlipidemia: Secondary | ICD-10-CM | POA: Diagnosis not present

## 2014-11-04 DIAGNOSIS — I10 Essential (primary) hypertension: Secondary | ICD-10-CM | POA: Diagnosis not present

## 2014-11-04 DIAGNOSIS — I5022 Chronic systolic (congestive) heart failure: Secondary | ICD-10-CM | POA: Diagnosis not present

## 2014-12-22 DIAGNOSIS — R200 Anesthesia of skin: Secondary | ICD-10-CM | POA: Diagnosis not present

## 2014-12-22 DIAGNOSIS — G603 Idiopathic progressive neuropathy: Secondary | ICD-10-CM | POA: Diagnosis not present

## 2014-12-22 DIAGNOSIS — G629 Polyneuropathy, unspecified: Secondary | ICD-10-CM | POA: Diagnosis not present

## 2014-12-24 DIAGNOSIS — G5602 Carpal tunnel syndrome, left upper limb: Secondary | ICD-10-CM | POA: Diagnosis not present

## 2015-01-07 DIAGNOSIS — M25571 Pain in right ankle and joints of right foot: Secondary | ICD-10-CM | POA: Diagnosis not present

## 2015-01-08 DIAGNOSIS — M25571 Pain in right ankle and joints of right foot: Secondary | ICD-10-CM | POA: Diagnosis not present

## 2015-01-08 DIAGNOSIS — M79604 Pain in right leg: Secondary | ICD-10-CM | POA: Diagnosis not present

## 2015-01-08 DIAGNOSIS — M25579 Pain in unspecified ankle and joints of unspecified foot: Secondary | ICD-10-CM | POA: Diagnosis not present

## 2015-01-13 DIAGNOSIS — M79671 Pain in right foot: Secondary | ICD-10-CM | POA: Diagnosis not present

## 2015-02-05 ENCOUNTER — Other Ambulatory Visit: Payer: Self-pay | Admitting: Family Medicine

## 2015-02-05 NOTE — Telephone Encounter (Signed)
Pt needs prescription for extra depth shoes for his leg braces.  He said they would come from [facility] fax [phone].  His call back number [phone]

## 2015-02-06 NOTE — Telephone Encounter (Signed)
Done.-jh 

## 2015-02-06 NOTE — Telephone Encounter (Signed)
Pt called second time for a rx for the shoes. Please advise.

## 2015-02-06 NOTE — Telephone Encounter (Signed)
Spoke to the pt wants a Rx for extra depth shoes generally made for diabetics pt with neuropathy and his old shoes and braces is getting rough around ankle so he wants this Rx faxed to [facility] Thanks Nisha.

## 2015-02-09 NOTE — Telephone Encounter (Signed)
This was faxed by Roselyn Reef on Friday.[place]

## 2015-02-24 DIAGNOSIS — Z23 Encounter for immunization: Secondary | ICD-10-CM | POA: Diagnosis not present

## 2015-03-24 DIAGNOSIS — M2041 Other hammer toe(s) (acquired), right foot: Secondary | ICD-10-CM | POA: Diagnosis not present

## 2015-04-16 ENCOUNTER — Encounter: Payer: Self-pay | Admitting: Family Medicine

## 2015-04-16 DIAGNOSIS — L03119 Cellulitis of unspecified part of limb: Secondary | ICD-10-CM

## 2015-04-16 DIAGNOSIS — K219 Gastro-esophageal reflux disease without esophagitis: Secondary | ICD-10-CM | POA: Insufficient documentation

## 2015-04-16 DIAGNOSIS — E7800 Pure hypercholesterolemia, unspecified: Secondary | ICD-10-CM

## 2015-04-16 DIAGNOSIS — G609 Hereditary and idiopathic neuropathy, unspecified: Secondary | ICD-10-CM | POA: Insufficient documentation

## 2015-04-16 DIAGNOSIS — I519 Heart disease, unspecified: Secondary | ICD-10-CM

## 2015-04-16 DIAGNOSIS — R600 Localized edema: Secondary | ICD-10-CM | POA: Insufficient documentation

## 2015-04-16 DIAGNOSIS — C61 Malignant neoplasm of prostate: Secondary | ICD-10-CM

## 2015-04-16 DIAGNOSIS — I10 Essential (primary) hypertension: Secondary | ICD-10-CM | POA: Insufficient documentation

## 2015-04-16 DIAGNOSIS — Z8546 Personal history of malignant neoplasm of prostate: Secondary | ICD-10-CM | POA: Insufficient documentation

## 2015-04-16 DIAGNOSIS — M1990 Unspecified osteoarthritis, unspecified site: Secondary | ICD-10-CM

## 2015-04-17 ENCOUNTER — Ambulatory Visit (INDEPENDENT_AMBULATORY_CARE_PROVIDER_SITE_OTHER): Payer: Medicare Other | Admitting: Family Medicine

## 2015-04-17 ENCOUNTER — Encounter: Payer: Self-pay | Admitting: Family Medicine

## 2015-04-17 VITALS — BP 145/70 | HR 80 | Temp 98.6°F | Resp 16 | Ht 72.0 in | Wt 216.0 lb

## 2015-04-17 DIAGNOSIS — E7800 Pure hypercholesterolemia, unspecified: Secondary | ICD-10-CM | POA: Diagnosis not present

## 2015-04-17 DIAGNOSIS — I10 Essential (primary) hypertension: Secondary | ICD-10-CM | POA: Diagnosis not present

## 2015-04-17 DIAGNOSIS — Z0000 Encounter for general adult medical examination without abnormal findings: Secondary | ICD-10-CM

## 2015-04-17 DIAGNOSIS — K219 Gastro-esophageal reflux disease without esophagitis: Secondary | ICD-10-CM | POA: Diagnosis not present

## 2015-04-17 NOTE — Patient Instructions (Addendum)
Plan referral to Dr. Jamal Collin for f/u colonoscopy when he returns from [place] next Spring.  Health Maintenance  Topic Date Due  . ZOSTAVAX  08/27/1997  . INFLUENZA VACCINE  01/12/2016  . TETANUS/TDAP  08/27/2020  . PNA vac Low Risk Adult  Completed

## 2015-04-17 NOTE — Progress Notes (Signed)
Patient: [name], Male    DOB: [birth year], 79 y.o.   MRN: [id number] Visit Date: 04/17/2015  Today's Provider: Dicky Doe, MD  Chief Complaint  Patient presents with  . Medicare Wellness    Subjective:   Initial preventative physical exam [name] is a 79 y.o. male who presents today for his Initial Preventative Physical Exam. He feels well. He reports exercising . He reports he is sleeping well.  HPI  Review of Systems  Social History   Social History  . Marital Status: Married    Spouse Name: N/A  . Number of Children: N/A  . Years of Education: N/A   Occupational History  . Not on file.   Social History Main Topics  . Smoking status: Never Smoker   . Smokeless tobacco: Never Used  . Alcohol Use: 0.0 oz/week    0 Standard drinks or equivalent per week  . Drug Use: No  . Sexual Activity: Not on file   Other Topics Concern  . Not on file   Social History Narrative    Patient Active Problem List   Diagnosis Date Noted  . Medicare annual wellness visit, initial 04/17/2015  . Prostate cancer ([place]) 04/16/2015  . Hypercholesteremia 04/16/2015  . Idiopathic peripheral neuropathy ([place]) 04/16/2015  . Essential hypertension 04/16/2015  . Heart disease 04/16/2015  . GERD (gastroesophageal reflux disease) 04/16/2015  . Cellulitis of digit 04/16/2015  . Arthritis 04/16/2015  . Pedal edema 04/16/2015    Past Surgical History  Procedure Laterality Date  . Basal cell carcinoma excision    . Hernia repair    . Coronary artery bypass graft      X2  . Colonoscopy  05/01/2009    His family history includes Heart disease in his mother.    Previous Medications   ALPHA LIPOIC ACID 200 MG CAPS    Take 2 capsules by mouth daily.   AMLODIPINE (NORVASC) 2.5 MG TABLET    Take 2.5 mg by mouth daily.   ASPIRIN 81 MG TABLET    Take 81 mg by mouth daily.   B COMPLEX VITAMINS (VITAMIN-B COMPLEX PO)    Take 1 tablet by mouth daily.   BIOTIN 1000 MCG TABLET    Take 1,000  mcg by mouth daily.   CALCIUM CARB-CHOLECALCIFEROL 500-100 MG-UNIT CHEW    Chew 500 mg by mouth daily.   CYANOCOBALAMIN 1000 MCG TABLET    Take 100 mcg by mouth daily.   FOLIC ACID (FOLVITE) 1 MG TABLET    Take 1 mg by mouth daily.   FUROSEMIDE (LASIX) 20 MG TABLET    Take 20 mg by mouth.   GABAPENTIN (NEURONTIN) 600 MG TABLET    Take 600 mg by mouth 3 (three) times daily.   GLUCOSAMINE SULFATE 1000 MG CAPS    Take 1 capsule by mouth daily.   HYDROCHLOROTHIAZIDE (HYDRODIURIL) 25 MG TABLET    Take 25 mg by mouth daily.   LECITHIN 400 MG CAPS    Take 400 mg by mouth daily.   LOSARTAN (COZAAR) 50 MG TABLET    Take 50 mg by mouth daily.   MELATONIN 3 MG CAPS    Take 3 mg by mouth at bedtime.   OMEGA 3 1000 MG CAPS    Take 1,000 mg by mouth daily.   OMEPRAZOLE (PRILOSEC) 20 MG CAPSULE    Take 20 mg by mouth daily.   PRAVASTATIN (PRAVACHOL) 80 MG TABLET    Take 80 mg by mouth daily.  PRIMIDONE (MYSOLINE) 50 MG TABLET    Take 50 mg by mouth 3 (three) times daily.   SELENIUM 200 MCG CAPS    Take 200 mcg by mouth daily.   TRETINOIN (RETIN-A) 0.05 % CREAM    Apply 1 application topically at bedtime.    Patient Care Team: Arlis Porta., MD as PCP - General (Family Medicine)     Objective:   Vitals: BP 167/98 mmHg  Pulse 80  Temp(Src) 98.6 F (37 C) (Oral)  Resp 16  Ht 6' (1.829 m)  Wt 216 lb (97.977 kg)  BMI 29.29 kg/m2  Physical Exam    Visual Acuity Screening   Right eye Left eye Both eyes  Without correction: 20/30 20/30 20/30   With correction:     Hearing Screening Comments: Patient wears bilateral hearing aid  Activities of Daily Living In your present state of health, do you have any difficulty performing the following activities: 04/17/2015 04/17/2015  Hearing? Tempie Donning  Vision? N Y  Difficulty concentrating or making decisions? N N  Walking or climbing stairs? Y Y  Dressing or bathing? N N  Doing errands, shopping? N N  Preparing Food and eating ? N N  Using the  Toilet? N N  In the past six months, have you accidently leaked urine? N N  Do you have problems with loss of bowel control? N N  Managing your Medications? N N  Managing your Finances? N N  Housekeeping or managing your Housekeeping? N N    Fall Risk Assessment Fall Risk  04/17/2015  Falls in the past year? No  Risk for fall due to : Impaired balance/gait     Patient reports there are safety devices in place in shower at home.   Depression Screen PHQ 2/9 Scores 04/17/2015  PHQ - 2 Score 0    MMSE MMSE - Mini Mental State Exam 04/17/2015  Orientation to time 5  Orientation to Place 5  Registration 3  Attention/ Calculation 5  Recall 3  Language- name 2 objects 2  Language- repeat 1  Language- follow 3 step command 3  Language- read & follow direction 1  Write a sentence 1  Copy design 1  Total score 30      Assessment & Plan:     Initial Preventative Physical Exam  Reviewed patient's Family Medical History Reviewed and updated list of patient's medical providers Assessment of cognitive impairment was done Assessed patient's functional ability Established a written schedule for health screening [place] Completed and Reviewed  Exercise Activities and Dietary recommendations Goals    . Exercise 150 minutes per week (moderate activity)     Patient goal is to remain as healthy as possible by continuing to walk and swim 4-5 times weekly.       Immunization History  Administered Date(s) Administered  . DTaP 08/26/2010  . Influenza,inj,Quad PF,36+ Mos 04/10/2014  . Pneumococcal Conjugate-13 04/10/2014  . Pneumococcal Polysaccharide-23 04/15/2009    Health Maintenance  Topic Date Due  . ZOSTAVAX  08/27/1997  . INFLUENZA VACCINE  01/12/2016  . TETANUS/TDAP  08/27/2020  . PNA vac Low Risk Adult  Completed      Discussed health benefits of physical activity, and encouraged him to engage in regular exercise appropriate for his age and  condition.    ------------------------------------------------------------------------------------------------------------   Problem List Items Addressed This Visit      Cardiovascular and Mediastinum   Essential hypertension   Relevant Orders   Comprehensive  Metabolic Panel (CMET)     Digestive   GERD (gastroesophageal reflux disease)   Relevant Orders   CBC with Differential     Other   Hypercholesteremia   Relevant Orders   Lipid Profile   Medicare annual wellness visit, initial - Primary       Larene Beach, MD [facility]  04/17/2015    1. Medicare annual wellness visit, initial   2. Essential hypertension  - Comprehensive Metabolic Panel (CMET)  3. Gastroesophageal reflux disease without esophagitis  - CBC with Differential  4. Hypercholesteremia  - Lipid Profile

## 2015-04-20 ENCOUNTER — Other Ambulatory Visit: Payer: Self-pay | Admitting: Family Medicine

## 2015-04-20 DIAGNOSIS — G250 Essential tremor: Secondary | ICD-10-CM | POA: Diagnosis not present

## 2015-04-20 DIAGNOSIS — G603 Idiopathic progressive neuropathy: Secondary | ICD-10-CM | POA: Diagnosis not present

## 2015-04-20 DIAGNOSIS — I10 Essential (primary) hypertension: Secondary | ICD-10-CM | POA: Diagnosis not present

## 2015-04-20 DIAGNOSIS — E7800 Pure hypercholesterolemia, unspecified: Secondary | ICD-10-CM | POA: Diagnosis not present

## 2015-04-20 DIAGNOSIS — K219 Gastro-esophageal reflux disease without esophagitis: Secondary | ICD-10-CM | POA: Diagnosis not present

## 2015-04-20 DIAGNOSIS — R2689 Other abnormalities of gait and mobility: Secondary | ICD-10-CM | POA: Diagnosis not present

## 2015-04-20 MED ORDER — HYDROCHLOROTHIAZIDE 25 MG PO TABS: 25.0000 mg | ORAL_TABLET | Freq: Every day | ORAL | Status: DC

## 2015-04-20 MED ORDER — LOSARTAN POTASSIUM 50 MG PO TABS: 50.0000 mg | ORAL_TABLET | Freq: Every day | ORAL | Status: DC

## 2015-04-20 MED ORDER — AMLODIPINE BESYLATE 2.5 MG PO TABS: 2.5000 mg | ORAL_TABLET | Freq: Every day | ORAL | Status: DC

## 2015-04-20 MED ORDER — FUROSEMIDE 20 MG PO TABS: 20.0000 mg | ORAL_TABLET | Freq: Every day | ORAL | Status: DC

## 2015-04-20 MED ORDER — PRIMIDONE 50 MG PO TABS: 50.0000 mg | ORAL_TABLET | Freq: Three times a day (TID) | ORAL | Status: DC

## 2015-04-20 MED ORDER — OMEPRAZOLE 20 MG PO CPDR: 20.0000 mg | DELAYED_RELEASE_CAPSULE | Freq: Every day | ORAL | Status: DC

## 2015-04-20 MED ORDER — PRAVASTATIN SODIUM 80 MG PO TABS: 80.0000 mg | ORAL_TABLET | Freq: Every day | ORAL | Status: DC

## 2015-04-20 MED ORDER — GABAPENTIN 600 MG PO TABS: 600.0000 mg | ORAL_TABLET | Freq: Three times a day (TID) | ORAL | Status: DC

## 2015-04-20 NOTE — Telephone Encounter (Signed)
Pt needs refills on primidone 50 mg, pravastatin 80 mg, hydrochlorothiazide 25 mg, omeprazole 20 mg, amlodipine 25 mg, gabapentin 600 mg, furosemide 20 mg, losartan 50 mg sent to [facility].  His call back number is [phone]

## 2015-04-21 ENCOUNTER — Other Ambulatory Visit: Payer: Self-pay | Admitting: Family Medicine

## 2015-04-21 DIAGNOSIS — I071 Rheumatic tricuspid insufficiency: Secondary | ICD-10-CM | POA: Insufficient documentation

## 2015-04-21 DIAGNOSIS — I5022 Chronic systolic (congestive) heart failure: Secondary | ICD-10-CM | POA: Diagnosis not present

## 2015-04-21 DIAGNOSIS — I2510 Atherosclerotic heart disease of native coronary artery without angina pectoris: Secondary | ICD-10-CM | POA: Diagnosis not present

## 2015-04-21 DIAGNOSIS — I10 Essential (primary) hypertension: Secondary | ICD-10-CM | POA: Diagnosis not present

## 2015-04-21 DIAGNOSIS — E876 Hypokalemia: Secondary | ICD-10-CM

## 2015-04-21 LAB — COMPREHENSIVE METABOLIC PANEL
ALBUMIN: 4.4 g/dL (ref 3.5–4.8)
ALK PHOS: 84 IU/L (ref 39–117)
ALT: 31 IU/L (ref 0–44)
AST: 28 IU/L (ref 0–40)
Albumin/Globulin Ratio: 1.8 (ref 1.1–2.5)
BILIRUBIN TOTAL: 0.8 mg/dL (ref 0.0–1.2)
BUN / CREAT RATIO: 13 (ref 10–22)
BUN: 10 mg/dL (ref 8–27)
CHLORIDE: 90 mmol/L — AB (ref 97–106)
CO2: 28 mmol/L (ref 18–29)
Calcium: 9.4 mg/dL (ref 8.6–10.2)
Creatinine, Ser: 0.78 mg/dL (ref 0.76–1.27)
GFR calc Af Amer: 101 mL/min/{1.73_m2} (ref >59)
GFR calc non Af Amer: 87 mL/min/{1.73_m2} (ref >59)
GLUCOSE: 98 mg/dL (ref 65–99)
Globulin, Total: 2.5 g/dL (ref 1.5–4.5)
Potassium: 3.2 mmol/L — ABNORMAL LOW (ref 3.5–5.2)
SODIUM: 136 mmol/L (ref 136–144)
Total Protein: 6.9 g/dL (ref 6.0–8.5)

## 2015-04-21 LAB — LIPID PANEL
CHOLESTEROL TOTAL: 181 mg/dL (ref 100–199)
Chol/HDL Ratio: 2.2 ratio units (ref 0.0–5.0)
HDL: 81 mg/dL (ref >39)
LDL Calculated: 76 mg/dL (ref 0–99)
TRIGLYCERIDES: 120 mg/dL (ref 0–149)
VLDL CHOLESTEROL CAL: 24 mg/dL (ref 5–40)

## 2015-04-21 LAB — CBC WITH DIFFERENTIAL/PLATELET
BASOS: 0 %
Basophils Absolute: 0 10*3/uL (ref 0.0–0.2)
EOS (ABSOLUTE): 0.1 10*3/uL (ref 0.0–0.4)
EOS: 1 %
HEMATOCRIT: 37.2 % — AB (ref 37.5–51.0)
Hemoglobin: 12.8 g/dL (ref 12.6–17.7)
IMMATURE GRANS (ABS): 0 10*3/uL (ref 0.0–0.1)
IMMATURE GRANULOCYTES: 0 %
LYMPHS: 10 %
Lymphocytes Absolute: 0.6 10*3/uL — ABNORMAL LOW (ref 0.7–3.1)
MCH: 35.9 pg — ABNORMAL HIGH (ref 26.6–33.0)
MCHC: 34.4 g/dL (ref 31.5–35.7)
MCV: 104 fL — ABNORMAL HIGH (ref 79–97)
Monocytes Absolute: 0.4 10*3/uL (ref 0.1–0.9)
Monocytes: 7 %
NEUTROS ABS: 4.7 10*3/uL (ref 1.4–7.0)
NEUTROS PCT: 82 %
Platelets: 221 10*3/uL (ref 150–379)
RBC: 3.57 x10E6/uL — ABNORMAL LOW (ref 4.14–5.80)
RDW: 13.7 % (ref 12.3–15.4)
WBC: 5.7 10*3/uL (ref 3.4–10.8)

## 2015-04-21 MED ORDER — POTASSIUM CHLORIDE ER 10 MEQ PO TBCR: 10.0000 meq | EXTENDED_RELEASE_TABLET | Freq: Every day | ORAL | Status: DC

## 2015-04-22 DIAGNOSIS — H2513 Age-related nuclear cataract, bilateral: Secondary | ICD-10-CM | POA: Diagnosis not present

## 2015-04-27 ENCOUNTER — Other Ambulatory Visit: Payer: Self-pay | Admitting: *Deleted

## 2015-04-27 DIAGNOSIS — E876 Hypokalemia: Secondary | ICD-10-CM

## 2015-04-28 DIAGNOSIS — D485 Neoplasm of uncertain behavior of skin: Secondary | ICD-10-CM | POA: Diagnosis not present

## 2015-04-28 DIAGNOSIS — L821 Other seborrheic keratosis: Secondary | ICD-10-CM | POA: Diagnosis not present

## 2015-04-28 DIAGNOSIS — L570 Actinic keratosis: Secondary | ICD-10-CM | POA: Diagnosis not present

## 2015-04-28 DIAGNOSIS — Z85828 Personal history of other malignant neoplasm of skin: Secondary | ICD-10-CM | POA: Diagnosis not present

## 2015-04-28 DIAGNOSIS — E876 Hypokalemia: Secondary | ICD-10-CM | POA: Diagnosis not present

## 2015-04-29 LAB — BASIC METABOLIC PANEL
BUN / CREAT RATIO: 12 (ref 10–22)
BUN: 9 mg/dL (ref 8–27)
CO2: 30 mmol/L — ABNORMAL HIGH (ref 18–29)
CREATININE: 0.73 mg/dL — AB (ref 0.76–1.27)
Calcium: 9.6 mg/dL (ref 8.6–10.2)
Chloride: 94 mmol/L — ABNORMAL LOW (ref 97–106)
GFR calc non Af Amer: 89 mL/min/{1.73_m2} (ref >59)
GFR, EST AFRICAN AMERICAN: 103 mL/min/{1.73_m2} (ref >59)
Glucose: 93 mg/dL (ref 65–99)
Potassium: 4.3 mmol/L (ref 3.5–5.2)
SODIUM: 138 mmol/L (ref 136–144)

## 2015-05-26 DIAGNOSIS — S90222A Contusion of left lesser toe(s) with damage to nail, initial encounter: Secondary | ICD-10-CM | POA: Diagnosis not present

## 2015-06-29 DIAGNOSIS — R197 Diarrhea, unspecified: Secondary | ICD-10-CM | POA: Diagnosis not present

## 2015-07-28 DIAGNOSIS — D649 Anemia, unspecified: Secondary | ICD-10-CM | POA: Diagnosis not present

## 2015-07-28 DIAGNOSIS — E876 Hypokalemia: Secondary | ICD-10-CM | POA: Diagnosis not present

## 2015-08-04 DIAGNOSIS — L600 Ingrowing nail: Secondary | ICD-10-CM | POA: Diagnosis not present

## 2015-08-06 DIAGNOSIS — D649 Anemia, unspecified: Secondary | ICD-10-CM | POA: Diagnosis not present

## 2015-08-31 DIAGNOSIS — D649 Anemia, unspecified: Secondary | ICD-10-CM | POA: Diagnosis not present

## 2015-09-03 DIAGNOSIS — Z86010 Personal history of colonic polyps: Secondary | ICD-10-CM | POA: Diagnosis not present

## 2015-09-03 DIAGNOSIS — D649 Anemia, unspecified: Secondary | ICD-10-CM | POA: Diagnosis not present

## 2015-09-17 ENCOUNTER — Ambulatory Visit
Admission: EM | Admit: 2015-09-17 | Discharge: 2015-09-17 | Disposition: A | Discharge status: Home / Self Care | Payer: Medicare Other | Attending: Family Medicine | Admitting: Family Medicine

## 2015-09-17 ENCOUNTER — Encounter: Payer: Self-pay | Admitting: Emergency Medicine

## 2015-09-17 DIAGNOSIS — S61012A Laceration without foreign body of left thumb without damage to nail, initial encounter: Secondary | ICD-10-CM

## 2015-09-17 MED ORDER — TETANUS-DIPHTH-ACELL PERTUSSIS 5-2.5-18.5 LF-MCG/0.5 IM SUSP
0.5000 mL | Freq: Once | INTRAMUSCULAR | Status: AC
  Administered 2015-09-17: 0.5 mL via INTRAMUSCULAR

## 2015-09-17 NOTE — ED Notes (Signed)
Pt reports while picking up broken ceramicpot last night cut left thumb. Pt reports applied wound seal last night that helped close wound and unsure if sutures are needed

## 2015-09-17 NOTE — ED Provider Notes (Signed)
CSN: [id number]     Arrival date & time 09/17/15  1024 History   First MD Initiated Contact with Patient 09/17/15 1225     Chief Complaint  Patient presents with  . Laceration   (Consider location/radiation/quality/duration/timing/severity/associated sxs/prior Treatment) HPI   This is a 80 year old gentleman who last night was picking up a broken ceramic pot and a sharp shard lacerated the radial distal tip of his left thumb. He had difficulty controlling the bleeding and eventually had to use wound seal which she stopped the bleeding immediately. He decided to be seen today for follow-up. The wound appears clean and dry and is not bleeding any longer. It is very superficial and barely extends through the epidermis.      Past Medical History  Diagnosis Date  . Superficial hematoma   . Prostate cancer ([place])   . Hypercholesterolemia   . GERD (gastroesophageal reflux disease)   . Arthritis   . Heart disease   . H/O echocardiogram     05/2013  . Hx of colonoscopy     04/22/2013   Past Surgical History  Procedure Laterality Date  . Basal cell carcinoma excision    . Hernia repair    . Coronary artery bypass graft      X2  . Colonoscopy  05/01/2009   Family History  Problem Relation Age of Onset  . Heart disease Mother    Social History  Substance Use Topics  . Smoking status: Never Smoker   . Smokeless tobacco: Never Used  . Alcohol Use: 0.0 oz/week    0 Standard drinks or equivalent per week    Review of Systems  Skin: Positive for color change and wound.  All other systems reviewed and are negative.   Allergies  Codeine; Indocin; and Latex  Home Medications   Prior to Admission medications   Medication Sig Start Date End Date Taking? Authorizing Provider  Alpha Lipoic Acid 200 MG CAPS Take 2 capsules by mouth daily.    Historical Provider, MD  amLODipine (NORVASC) 2.5 MG tablet Take 1 tablet (2.5 mg total) by mouth daily. 04/20/15   Arlis Porta., MD   aspirin 81 MG tablet Take 81 mg by mouth daily.    Historical Provider, MD  B Complex Vitamins (VITAMIN-B COMPLEX PO) Take 1 tablet by mouth daily.    Historical Provider, MD  Biotin 1000 MCG tablet Take 1,000 mcg by mouth daily.    Historical Provider, MD  Calcium Carb-Cholecalciferol 500-100 MG-UNIT CHEW Chew 500 mg by mouth daily.    Historical Provider, MD  cyanocobalamin 1000 MCG tablet Take 100 mcg by mouth daily.    Historical Provider, MD  folic acid (FOLVITE) 1 MG tablet Take 1 mg by mouth daily.    Historical Provider, MD  furosemide (LASIX) 20 MG tablet Take 1 tablet (20 mg total) by mouth daily. 04/20/15   Arlis Porta., MD  gabapentin (NEURONTIN) 600 MG tablet Take 1 tablet (600 mg total) by mouth 3 (three) times daily. 04/20/15   Arlis Porta., MD  Glucosamine Sulfate 1000 MG CAPS Take 1 capsule by mouth daily.    Historical Provider, MD  hydrochlorothiazide (HYDRODIURIL) 25 MG tablet Take 1 tablet (25 mg total) by mouth daily. 04/20/15   Arlis Porta., MD  Lecithin 400 MG CAPS Take 400 mg by mouth daily.    Historical Provider, MD  losartan (COZAAR) 50 MG tablet Take 1 tablet (50 mg total) by mouth daily. 04/20/15  Arlis Porta., MD  Melatonin 3 MG CAPS Take 3 mg by mouth at bedtime.    Historical Provider, MD  Omega 3 1000 MG CAPS Take 1,000 mg by mouth daily.    Historical Provider, MD  omeprazole (PRILOSEC) 20 MG capsule Take 1 capsule (20 mg total) by mouth daily. 04/20/15   Arlis Porta., MD  potassium chloride (K-DUR) 10 MEQ tablet Take 1 tablet (10 mEq total) by mouth daily. 04/21/15   Arlis Porta., MD  pravastatin (PRAVACHOL) 80 MG tablet Take 1 tablet (80 mg total) by mouth daily. 04/20/15   Arlis Porta., MD  primidone (MYSOLINE) 50 MG tablet Take 1 tablet (50 mg total) by mouth 3 (three) times daily. 04/20/15   Arlis Porta., MD  Selenium 200 MCG CAPS Take 200 mcg by mouth daily.    Historical Provider, MD  tretinoin (RETIN-A)  0.05 % cream Apply 1 application topically at bedtime.    Historical Provider, MD   Meds Ordered and Administered this Visit   Medications  Tdap (BOOSTRIX) injection 0.5 mL (0.5 mLs Intramuscular Given 09/17/15 1325)    BP 139/84 mmHg  Pulse 67  Temp(Src) 97.7 F (36.5 C) (Oral)  Resp 17  Ht 6' (1.829 m)  Wt 212 lb (96.163 kg)  BMI 28.75 kg/m2  SpO2 97% No data found.   Physical Exam  Constitutional: He is oriented to person, place, and time. He appears well-developed and well-nourished. No distress.  HENT:  Head: Normocephalic and atraumatic.  Eyes: Conjunctivae are normal. Pupils are equal, round, and reactive to light.  Neck: Normal range of motion. Neck supple.  Musculoskeletal:  Examination of the left thumb shows a 1/2 cm laceration extending barely through the epidermis over the distal ulnar tip. This does not extend into the nail. It is clean and dry. There is no erythema or induration. There is no drainage.  Neurological: He is alert and oriented to person, place, and time.  Skin: Skin is warm and dry. He is not diaphoretic.  Psychiatric: He has a normal mood and affect. His behavior is normal. Judgment and thought content normal.  Nursing note and vitals reviewed.   ED Course  Procedures (including critical care time)  Labs Review Labs Reviewed - No data to display  Imaging Review No results found.   Visual Acuity Review  Right Eye Distance:   Left Eye Distance:   Bilateral Distance:    Right Eye Near:   Left Eye Near:    Bilateral Near:     Left thumb was cleansed with Hibiclens and water. Thoroughly dried. Several layers of Dermabond were utilized to seal the wound and provide waterproofing. After thorough drying a dry sterile dressing was then applied. Tolerated procedure well.  Medications  Tdap (BOOSTRIX) injection 0.5 mL (0.5 mLs Intramuscular Given 09/17/15 1325)    MDM   1. Laceration of left thumb without complication, initial encounter     The patient will leave the Dermabond on and not pick it off until fall off naturally. Signs and symptoms of infection were reviewed with the patient. He may return here if he notices any redness or changes in the wound itself. He may also follow up with Dr. Larene Beach his primary care physician    Lorin Picket, PA-C 09/17/15 [phone]

## 2015-09-17 NOTE — Discharge Instructions (Signed)
Stitches, Staples, or Adhesive Wound Closure  °Health care providers use stitches (sutures), staples, and certain glue (skin adhesives) to hold skin together while it heals (wound closure). You may need this treatment after you have surgery or if you cut your skin accidentally. These methods help your skin to heal more quickly and make it less likely that you will have a scar. A wound may take several months to heal completely.  °The type of wound you have determines when your wound gets closed. In most cases, the wound is closed as soon as possible (primary skin closure). Sometimes, closure is delayed so the wound can be cleaned and allowed to heal naturally. This reduces the chance of infection. Delayed closure may be needed if your wound:  °Is caused by a bite.  °Happened more than 6 hours ago.  °Involves loss of skin or the tissues under the skin.  °Has dirt or debris in it that cannot be removed.  °Is infected. °WHAT ARE THE DIFFERENT KINDS OF WOUND CLOSURES?  °There are many options for wound closure. The one that your health care provider uses depends on how deep and how large your wound is.  °Adhesive Glue  °To use this type of glue to close a wound, your health care provider holds the edges of the wound together and paints the glue on the surface of your skin. You may need more than one layer of glue. Then the wound may be covered with a light bandage (dressing).  °This type of skin closure may be used for small wounds that are not deep (superficial). Using glue for wound closure is less painful than other methods. It does not require a medicine that numbs the area (local anesthetic). This method also leaves nothing to be removed. Adhesive glue is often used for children and on facial wounds.  °Adhesive glue cannot be used for wounds that are deep, uneven, or bleeding. It is not used inside of a wound.  °Adhesive Strips  °These strips are made of sticky (adhesive), porous paper. They are applied across your  skin edges like a regular adhesive bandage. You leave them on until they fall off.  °Adhesive strips may be used to close very superficial wounds. They may also be used along with sutures to improve the closure of your skin edges.  °Sutures  °Sutures are the oldest method of wound closure. Sutures can be made from natural substances, such as silk, or from synthetic materials, such as nylon and steel. They can be made from a material that your body can break down as your wound heals (absorbable), or they can be made from a material that needs to be removed from your skin (nonabsorbable). They come in many different strengths and sizes.  °Your health care provider attaches the sutures to a steel needle on one end. Sutures can be passed through your skin, or through the tissues beneath your skin. Then they are tied and cut. Your skin edges may be closed in one continuous stitch or in separate stitches.  °Sutures are strong and can be used for all kinds of wounds. Absorbable sutures may be used to close tissues under the skin. The disadvantage of sutures is that they may cause skin reactions that lead to infection. Nonabsorbable sutures need to be removed.  °Staples  °When surgical staples are used to close a wound, the edges of your skin on both sides of the wound are brought close together. A staple is placed across the wound, and   an instrument secures the edges together. Staples are often used to close surgical cuts (incisions).  °Staples are faster to use than sutures, and they cause less skin reaction. Staples need to be removed using a tool that bends the staples away from your skin.  °HOW DO I CARE FOR MY WOUND CLOSURE?  °Take medicines only as directed by your health care provider.  °If you were prescribed an antibiotic medicine for your wound, finish it all even if you start to feel better.  °Use ointments or creams only as directed by your health care provider.  °Wash your hands with soap and water before and  after touching your wound.  °Do not soak your wound in water. Do not take baths, swim, or use a hot tub until your health care provider approves.  °Ask your health care provider when you can start showering. Cover your wound if directed by your health care provider.  °Do not take out your own sutures or staples.  °Do not pick at your wound. Picking can cause an infection.  °Keep all follow-up visits as directed by your health care provider. This is important. °HOW LONG WILL I HAVE MY WOUND CLOSURE?  °Leave adhesive glue on your skin until the glue peels away.  °Leave adhesive strips on your skin until the strips fall off.  °Absorbable sutures will dissolve within several days.  °Nonabsorbable sutures and staples must be removed. The location of the wound will determine how long they stay in. This can range from several days to a couple of weeks. °WHEN SHOULD I SEEK HELP FOR MY WOUND CLOSURE?  °Contact your health care provider if:  °You have a fever.  °You have chills.  °You have drainage, redness, swelling, or pain at your wound.  °There is a bad smell coming from your wound.  °The skin edges of your wound start to separate after your sutures have been removed.  °Your wound becomes thick, raised, and darker in color after your sutures come out (scarring). °This information is not intended to replace advice given to you by your health care provider. Make sure you discuss any questions you have with your health care provider.  °Document Released: 02/22/2001 Document Revised: 06/20/2014 Document Reviewed: 11/06/2013  °Elsevier Interactive Patient Education ©2016 [employer]  ° °

## 2015-09-22 ENCOUNTER — Encounter: Payer: Self-pay | Admitting: Family Medicine

## 2015-09-22 ENCOUNTER — Ambulatory Visit (INDEPENDENT_AMBULATORY_CARE_PROVIDER_SITE_OTHER): Payer: Medicare Other | Admitting: Family Medicine

## 2015-09-22 VITALS — BP 125/70 | HR 90 | Temp 98.7°F | Resp 16 | Ht 72.0 in | Wt 220.1 lb

## 2015-09-22 DIAGNOSIS — D539 Nutritional anemia, unspecified: Secondary | ICD-10-CM | POA: Insufficient documentation

## 2015-09-22 DIAGNOSIS — E7800 Pure hypercholesterolemia, unspecified: Secondary | ICD-10-CM

## 2015-09-22 DIAGNOSIS — G609 Hereditary and idiopathic neuropathy, unspecified: Secondary | ICD-10-CM | POA: Diagnosis not present

## 2015-09-22 DIAGNOSIS — D598 Other acquired hemolytic anemias: Secondary | ICD-10-CM

## 2015-09-22 DIAGNOSIS — K219 Gastro-esophageal reflux disease without esophagitis: Secondary | ICD-10-CM

## 2015-09-22 DIAGNOSIS — I519 Heart disease, unspecified: Secondary | ICD-10-CM

## 2015-09-22 DIAGNOSIS — I10 Essential (primary) hypertension: Secondary | ICD-10-CM | POA: Diagnosis not present

## 2015-09-22 DIAGNOSIS — R600 Localized edema: Secondary | ICD-10-CM | POA: Diagnosis not present

## 2015-09-22 MED ORDER — HYDROCHLOROTHIAZIDE 25 MG PO TABS: ORAL_TABLET | ORAL | Status: DC

## 2015-09-22 MED ORDER — POTASSIUM CHLORIDE ER 20 MEQ PO TBCR: EXTENDED_RELEASE_TABLET | ORAL | Status: DC

## 2015-09-22 NOTE — Progress Notes (Signed)
Name: [name]   MRN: [id number]    DOB: 21-Mar-1936   Date:09/22/2015       Progress Note  Subjective  Chief Complaint  Chief Complaint  Patient presents with  . Follow-up    Hypertension, GERD, Hypercholesteremia    HPI Here for f/u of HBP, Elevated lipids, GERD.  Has idiopathic peripheral neuropathy.  Lacerated  L thumb 5 nights ago.  Closed with Glu and doing well.  Has had minimally low Hg and a low K+, and dose of K Cl was increased to 20 meq, 2 a day.  Needs to be checked again.    No problem-specific assessment & plan notes found for this encounter.   Past Medical History  Diagnosis Date  . Superficial hematoma   . Prostate cancer ([place])   . Hypercholesterolemia   . GERD (gastroesophageal reflux disease)   . Arthritis   . Heart disease   . H/O echocardiogram     05/2013  . Hx of colonoscopy     04/22/2013    Past Surgical History  Procedure Laterality Date  . Basal cell carcinoma excision    . Hernia repair    . Coronary artery bypass graft      X2  . Colonoscopy  05/01/2009    Family History  Problem Relation Age of Onset  . Heart disease Mother     Social History   Social History  . Marital Status: Married    Spouse Name: N/A  . Number of Children: N/A  . Years of Education: N/A   Occupational History  . Not on file.   Social History Main Topics  . Smoking status: Never Smoker   . Smokeless tobacco: Never Used  . Alcohol Use: 0.0 oz/week    0 Standard drinks or equivalent per week  . Drug Use: No  . Sexual Activity: Not on file   Other Topics Concern  . Not on file   Social History Narrative     Current outpatient prescriptions:  .  Alpha Lipoic Acid 200 MG CAPS, Take 2 capsules by mouth daily., Disp: , Rfl:  .  amLODipine (NORVASC) 2.5 MG tablet, Take 1 tablet (2.5 mg total) by mouth daily., Disp: 90 tablet, Rfl: 2 .  aspirin 81 MG tablet, Take 81 mg by mouth daily., Disp: , Rfl:  .  B Complex Vitamins (VITAMIN-B COMPLEX PO), Take 1  tablet by mouth daily., Disp: , Rfl:  .  Biotin 1000 MCG tablet, Take 1,000 mcg by mouth daily., Disp: , Rfl:  .  Calcium Carb-Cholecalciferol 500-100 MG-UNIT CHEW, Chew 500 mg by mouth daily., Disp: , Rfl:  .  cyanocobalamin 1000 MCG tablet, Take 100 mcg by mouth daily., Disp: , Rfl:  .  folic acid (FOLVITE) 1 MG tablet, Take 1 mg by mouth daily., Disp: , Rfl:  .  furosemide (LASIX) 20 MG tablet, Take 1 tablet (20 mg total) by mouth daily., Disp: 90 tablet, Rfl: 2 .  gabapentin (NEURONTIN) 600 MG tablet, Take 1 tablet (600 mg total) by mouth 3 (three) times daily., Disp: 360 tablet, Rfl: 3 .  Glucosamine Sulfate 1000 MG CAPS, Take 1 capsule by mouth daily., Disp: , Rfl:  .  hydrochlorothiazide (HYDRODIURIL) 25 MG tablet, Take 1/2 tablet orally each AM, Disp: 90 tablet, Rfl: 2 .  Lecithin 400 MG CAPS, Take 400 mg by mouth daily., Disp: , Rfl:  .  losartan (COZAAR) 50 MG tablet, Take 1 tablet (50 mg total) by mouth daily., Disp: 90  tablet, Rfl: 2 .  Melatonin 3 MG CAPS, Take 3 mg by mouth at bedtime., Disp: , Rfl:  .  Omega 3 1000 MG CAPS, Take 1,000 mg by mouth daily., Disp: , Rfl:  .  omeprazole (PRILOSEC) 20 MG capsule, Take 1 capsule (20 mg total) by mouth daily., Disp: 90 capsule, Rfl: 3 .  potassium chloride 20 MEQ TBCR, Take 2 tablets each day, Disp: 180 tablet, Rfl: 3 .  pravastatin (PRAVACHOL) 80 MG tablet, Take 1 tablet (80 mg total) by mouth daily., Disp: 90 tablet, Rfl: 2 .  primidone (MYSOLINE) 50 MG tablet, Take 1 tablet (50 mg total) by mouth 3 (three) times daily., Disp: 360 tablet, Rfl: 2 .  Selenium 200 MCG CAPS, Take 200 mcg by mouth daily., Disp: , Rfl:  .  tretinoin (RETIN-A) 0.05 % cream, Apply 1 application topically at bedtime., Disp: , Rfl:   Allergies  Allergen Reactions  . Codeine Other (See Comments)  . Indocin [Indomethacin] Other (See Comments)  . Latex Other (See Comments)     Review of Systems  Constitutional: Negative for fever, chills, weight loss and  malaise/fatigue.  HENT: Negative for hearing loss.   Eyes: Negative for blurred vision and double vision.  Respiratory: Negative for cough, shortness of breath and wheezing.   Cardiovascular: Negative for chest pain, palpitations and leg swelling.  Gastrointestinal: Negative for heartburn, abdominal pain and blood in stool.  Genitourinary: Negative for dysuria, urgency and frequency.  Skin: Negative for rash.  Neurological: Negative for tremors, weakness and headaches.      Objective  Filed Vitals:   09/22/15 1311 09/22/15 1351  BP: 128/77 125/70  Pulse: 90   Temp: 98.7 F (37.1 C)   TempSrc: Oral   Resp: 16   Height: 6' (1.829 m)   Weight: 220 lb 1.6 oz (99.837 kg)     Physical Exam  Constitutional: He is oriented to person, place, and time and well-developed, well-nourished, and in no distress. No distress.  HENT:  Head: Normocephalic and atraumatic.  Eyes: Conjunctivae and EOM are normal. Pupils are equal, round, and reactive to light. No scleral icterus.  Neck: Normal range of motion. Neck supple. No thyromegaly present.  Cardiovascular: Normal rate and regular rhythm.  Exam reveals no gallop and no friction rub.   No murmur heard. Pulmonary/Chest: Effort normal and breath sounds normal. No respiratory distress. He has no wheezes. He has no rales.  Abdominal: Soft. Bowel sounds are normal. He exhibits no distension and no mass. There is no tenderness.  Musculoskeletal: He exhibits no edema.  Lymphadenopathy:    He has no cervical adenopathy.  Neurological: He is alert and oriented to person, place, and time.  Wearing braces on both legs sec. to peripheral neuropathny  Skin:  Superficial skin laceration of L thumb healing well.  Vitals reviewed.      No results found for this or any previous visit (from the past 2160 hour(s)).   Assessment & Plan  Problem List Items Addressed This Visit      Cardiovascular and Mediastinum   Essential hypertension - Primary    Relevant Medications   hydrochlorothiazide (HYDRODIURIL) 25 MG tablet   potassium chloride 20 MEQ TBCR   Other Relevant Orders   Comprehensive Metabolic Panel (CMET)   Heart disease   Relevant Medications   hydrochlorothiazide (HYDRODIURIL) 25 MG tablet     Digestive   GERD (gastroesophageal reflux disease)     Nervous and Auditory   Idiopathic peripheral neuropathy (HCC)  Other   Hypercholesteremia   Relevant Medications   hydrochlorothiazide (HYDRODIURIL) 25 MG tablet   Other Relevant Orders   Lipid Profile   Pedal edema   Anemia   Relevant Orders   CBC with Differential      Meds ordered this encounter  Medications  . hydrochlorothiazide (HYDRODIURIL) 25 MG tablet    Sig: Take 1/2 tablet orally each AM    Dispense:  90 tablet    Refill:  2  . potassium chloride 20 MEQ TBCR    Sig: Take 2 tablets each day    Dispense:  180 tablet    Refill:  3   1. Essential hypertension  - Comprehensive Metabolic Panel (CMET) - hydrochlorothiazide (HYDRODIURIL) 25 MG tablet; Take 1/2 tablet orally each AM  Dispense: 90 tablet; Refill: 2 - potassium chloride 20 MEQ TBCR; Take 2 tablets each day  Dispense: 180 tablet; Refill: 3  2. Heart disease   3. Gastroesophageal reflux disease without esophagitis   4. Idiopathic peripheral neuropathy (HCC)   5. Hypercholesteremia  - Lipid Profile  6. Pedal edema   7. Other acquired hemolytic anemias ([place])  - CBC with Differential  Continue all other medications at current doses

## 2015-09-29 DIAGNOSIS — I10 Essential (primary) hypertension: Secondary | ICD-10-CM | POA: Diagnosis not present

## 2015-09-29 DIAGNOSIS — E7800 Pure hypercholesterolemia, unspecified: Secondary | ICD-10-CM | POA: Diagnosis not present

## 2015-09-29 DIAGNOSIS — L820 Inflamed seborrheic keratosis: Secondary | ICD-10-CM | POA: Diagnosis not present

## 2015-09-29 DIAGNOSIS — L570 Actinic keratosis: Secondary | ICD-10-CM | POA: Diagnosis not present

## 2015-09-29 DIAGNOSIS — L821 Other seborrheic keratosis: Secondary | ICD-10-CM | POA: Diagnosis not present

## 2015-09-29 DIAGNOSIS — D598 Other acquired hemolytic anemias: Secondary | ICD-10-CM | POA: Diagnosis not present

## 2015-09-29 DIAGNOSIS — Z85828 Personal history of other malignant neoplasm of skin: Secondary | ICD-10-CM | POA: Diagnosis not present

## 2015-09-30 LAB — COMPREHENSIVE METABOLIC PANEL
A/G RATIO: 2 (ref 1.2–2.2)
ALK PHOS: 78 IU/L (ref 39–117)
ALT: 35 IU/L (ref 0–44)
AST: 25 IU/L (ref 0–40)
Albumin: 4.4 g/dL (ref 3.5–4.8)
BUN/Creatinine Ratio: 12 (ref 10–24)
BUN: 10 mg/dL (ref 8–27)
Bilirubin Total: 0.4 mg/dL (ref 0.0–1.2)
CALCIUM: 9.3 mg/dL (ref 8.6–10.2)
CO2: 26 mmol/L (ref 18–29)
Chloride: 95 mmol/L — ABNORMAL LOW (ref 96–106)
Creatinine, Ser: 0.82 mg/dL (ref 0.76–1.27)
GFR calc Af Amer: 98 mL/min/{1.73_m2} (ref >59)
GFR, EST NON AFRICAN AMERICAN: 85 mL/min/{1.73_m2} (ref >59)
Globulin, Total: 2.2 g/dL (ref 1.5–4.5)
Glucose: 92 mg/dL (ref 65–99)
POTASSIUM: 4.2 mmol/L (ref 3.5–5.2)
SODIUM: 140 mmol/L (ref 134–144)
Total Protein: 6.6 g/dL (ref 6.0–8.5)

## 2015-09-30 LAB — LIPID PANEL
CHOL/HDL RATIO: 2.4 ratio (ref 0.0–5.0)
Cholesterol, Total: 182 mg/dL (ref 100–199)
HDL: 75 mg/dL (ref >39)
LDL CALC: 63 mg/dL (ref 0–99)
Triglycerides: 220 mg/dL — ABNORMAL HIGH (ref 0–149)
VLDL CHOLESTEROL CAL: 44 mg/dL — AB (ref 5–40)

## 2015-09-30 LAB — CBC WITH DIFFERENTIAL/PLATELET
Basophils Absolute: 0 10*3/uL (ref 0.0–0.2)
Basos: 1 %
EOS (ABSOLUTE): 0.1 10*3/uL (ref 0.0–0.4)
Eos: 3 %
Hematocrit: 37.9 % (ref 37.5–51.0)
Hemoglobin: 12.7 g/dL (ref 12.6–17.7)
IMMATURE GRANULOCYTES: 0 %
Immature Grans (Abs): 0 10*3/uL (ref 0.0–0.1)
LYMPHS ABS: 1.4 10*3/uL (ref 0.7–3.1)
Lymphs: 39 %
MCH: 35.2 pg — ABNORMAL HIGH (ref 26.6–33.0)
MCHC: 33.5 g/dL (ref 31.5–35.7)
MCV: 105 fL — ABNORMAL HIGH (ref 79–97)
MONOS ABS: 0.4 10*3/uL (ref 0.1–0.9)
Monocytes: 10 %
NEUTROS PCT: 47 %
Neutrophils Absolute: 1.7 10*3/uL (ref 1.4–7.0)
PLATELETS: 263 10*3/uL (ref 150–379)
RBC: 3.61 x10E6/uL — AB (ref 4.14–5.80)
RDW: 13.6 % (ref 12.3–15.4)
WBC: 3.7 10*3/uL (ref 3.4–10.8)

## 2015-10-06 ENCOUNTER — Encounter: Payer: Self-pay | Admitting: Family Medicine

## 2015-10-06 ENCOUNTER — Ambulatory Visit (INDEPENDENT_AMBULATORY_CARE_PROVIDER_SITE_OTHER): Payer: Medicare Other | Admitting: Family Medicine

## 2015-10-06 VITALS — BP 135/73 | HR 76 | Temp 98.3°F | Resp 16 | Wt 219.4 lb

## 2015-10-06 DIAGNOSIS — S29011A Strain of muscle and tendon of front wall of thorax, initial encounter: Secondary | ICD-10-CM | POA: Diagnosis not present

## 2015-10-06 NOTE — Progress Notes (Signed)
Name: [name]   MRN: [id number]    DOB: Jan 09, 1936   Date:10/06/2015       Progress Note  Subjective  Chief Complaint  Chief Complaint  Patient presents with  . Cough    Onset Sunday, coughing up clear/white phlegm. Pt states lower rib cage sore.    HPI Here c/o bilateral rib and lat. abd pain that started after coughing heavily 2 days ago.  Cough is better and resolving, but his rib and abd pain have not resolved.  Mild pain with deep breathing.  No  SOB.  He has had no fevr.  No problem-specific assessment & plan notes found for this encounter.   Past Medical History  Diagnosis Date  . Superficial hematoma   . Prostate cancer ([facility])   . Hypercholesterolemia   . GERD (gastroesophageal reflux disease)   . Arthritis   . Heart disease   . H/O echocardiogram     05/2013  . Hx of colonoscopy     11 /03/2013    Social History  Substance Use Topics  . Smoking status: Never Smoker   . Smokeless tobacco: Never Used  . Alcohol Use: 0.0 oz/week    0 Standard drinks or equivalent per week     Current outpatient prescriptions:  .  Alpha Lipoic Acid 200 MG CAPS, Take 2 capsules by mouth daily., Disp: , Rfl:  .  amLODipine (NORVASC) 2.5 MG tablet, Take 1 tablet (2.5 mg total) by mouth daily., Disp: 90 tablet, Rfl: 2 .  aspirin 81 MG tablet, Take 81 mg by mouth daily., Disp: , Rfl:  .  Biotin 1000 MCG tablet, Take 1,000 mcg by mouth daily., Disp: , Rfl:  .  Calcium Carb-Cholecalciferol 500-100 MG-UNIT CHEW, Chew 500 mg by mouth daily., Disp: , Rfl:  .  cyanocobalamin 1000 MCG tablet, Take 100 mcg by mouth daily., Disp: , Rfl:  .  folic acid (FOLVITE) 1 MG tablet, Take 1 mg by mouth daily., Disp: , Rfl:  .  furosemide (LASIX) 20 MG tablet, Take 1 tablet (20 mg total) by mouth daily., Disp: 90 tablet, Rfl: 2 .  gabapentin (NEURONTIN) 600 MG tablet, Take 1 tablet (600 mg total) by mouth 3 (three) times daily., Disp: 360 tablet, Rfl: 3 .  Glucosamine Sulfate 1000 MG CAPS, Take 1  capsule by mouth daily., Disp: , Rfl:  .  hydrochlorothiazide (HYDRODIURIL) 25 MG tablet, Take 1/2 tablet orally each AM, Disp: 90 tablet, Rfl: 2 .  Lecithin 400 MG CAPS, Take 400 mg by mouth daily., Disp: , Rfl:  .  losartan (COZAAR) 50 MG tablet, Take 1 tablet (50 mg total) by mouth daily., Disp: 90 tablet, Rfl: 2 .  Melatonin 3 MG CAPS, Take 3 mg by mouth at bedtime., Disp: , Rfl:  .  Omega 3 1000 MG CAPS, Take 1,000 mg by mouth daily., Disp: , Rfl:  .  omeprazole (PRILOSEC) 20 MG capsule, Take 1 capsule (20 mg total) by mouth daily., Disp: 90 capsule, Rfl: 3 .  potassium chloride 20 MEQ TBCR, Take 2 tablets each day, Disp: 180 tablet, Rfl: 3 .  pravastatin (PRAVACHOL) 80 MG tablet, Take 1 tablet (80 mg total) by mouth daily., Disp: 90 tablet, Rfl: 2 .  primidone (MYSOLINE) 50 MG tablet, Take 1 tablet (50 mg total) by mouth 3 (three) times daily., Disp: 360 tablet, Rfl: 2 .  Selenium 200 MCG CAPS, Take 200 mcg by mouth daily., Disp: , Rfl:  .  tretinoin (RETIN-A) 0.05 % cream, Apply  1 application topically at bedtime., Disp: , Rfl:  .  B Complex Vitamins (VITAMIN-B COMPLEX PO), Take 1 tablet by mouth daily. Reported on 10/06/2015, Disp: , Rfl:   Allergies  Allergen Reactions  . Codeine Other (See Comments)  . Indocin [Indomethacin] Other (See Comments)  . Latex Other (See Comments)    Review of Systems  Constitutional: Negative for fever, chills, weight loss and malaise/fatigue.  HENT: Negative for hearing loss.   Eyes: Negative for blurred vision and double vision.  Respiratory: Positive for cough (resolved). Negative for shortness of breath and wheezing.   Cardiovascular: Positive for chest pain (chest wall pain bilat.). Negative for palpitations and leg swelling.  Gastrointestinal: Negative for heartburn, abdominal pain and blood in stool.  Genitourinary: Negative for dysuria, urgency and frequency.  Skin: Negative for rash.  Neurological: Negative for weakness and headaches.       Objective  Filed Vitals:   10/06/15 1505  BP: 135/73  Pulse: 76  Temp: 98.3 F (36.8 C)  TempSrc: Oral  Resp: 16  Weight: 219 lb 6.4 oz (99.519 kg)  SpO2: 95%     Physical Exam  Constitutional: He is well-developed, well-nourished, and in no distress. No distress.  Cardiovascular: Normal rate, regular rhythm and normal heart sounds.  Exam reveals no gallop and no friction rub.   No murmur heard. Pulmonary/Chest: Effort normal and breath sounds normal. No respiratory distress. He has no wheezes. He has no rales.  Tender bilateral lateral chest wall to palpation.  No point tenderness  Vitals reviewed.     Recent Results (from the past 2160 hour(s))  Lipid Profile     Status: Abnormal   Collection Time: 09/29/15  8:45 AM  Result Value Ref Range   Cholesterol, Total 182 100 - 199 mg/dL   Triglycerides 220 (H) 0 - 149 mg/dL   HDL 75 >39 mg/dL   VLDL Cholesterol Cal 44 (H) 5 - 40 mg/dL   LDL Calculated 63 0 - 99 mg/dL   Chol/HDL Ratio 2.4 0.0 - 5.0 ratio units    Comment:                                   T. Chol/HDL Ratio                                             Men  Women                               1/2 Avg.Risk  3.4    3.3                                   Avg.Risk  5.0    4.4                                2X Avg.Risk  9.6    7.1                                3X Avg.Risk 23.4   11.0   CBC with Differential  Status: Abnormal   Collection Time: 09/29/15  8:45 AM  Result Value Ref Range   WBC 3.7 3.4 - 10.8 x10E3/uL   RBC 3.61 (L) 4.14 - 5.80 x10E6/uL   Hemoglobin 12.7 12.6 - 17.7 g/dL   Hematocrit 37.9 37.5 - 51.0 %   MCV 105 (H) 79 - 97 fL   MCH 35.2 (H) 26.6 - 33.0 pg   MCHC 33.5 31.5 - 35.7 g/dL   RDW 13.6 12.3 - 15.4 %   Platelets 263 150 - 379 x10E3/uL   Neutrophils 47 %   Lymphs 39 %   Monocytes 10 %   Eos 3 %   Basos 1 %   Neutrophils Absolute 1.7 1.4 - 7.0 x10E3/uL   Lymphocytes Absolute 1.4 0.7 - 3.1 x10E3/uL   Monocytes Absolute  0.4 0.1 - 0.9 x10E3/uL   EOS (ABSOLUTE) 0.1 0.0 - 0.4 x10E3/uL   Basophils Absolute 0.0 0.0 - 0.2 x10E3/uL   Immature Granulocytes 0 %   Immature Grans (Abs) 0.0 0.0 - 0.1 x10E3/uL  Comprehensive Metabolic Panel (CMET)     Status: Abnormal   Collection Time: 09/29/15  8:45 AM  Result Value Ref Range   Glucose 92 65 - 99 mg/dL   BUN 10 8 - 27 mg/dL   Creatinine, Ser 0.82 0.76 - 1.27 mg/dL   GFR calc non Af Amer 85 >59 mL/min/1.73   GFR calc Af Amer 98 >59 mL/min/1.73   BUN/Creatinine Ratio 12 10 - 24   Sodium 140 134 - 144 mmol/L   Potassium 4.2 3.5 - 5.2 mmol/L   Chloride 95 (L) 96 - 106 mmol/L   CO2 26 18 - 29 mmol/L   Calcium 9.3 8.6 - 10.2 mg/dL   Total Protein 6.6 6.0 - 8.5 g/dL   Albumin 4.4 3.5 - 4.8 g/dL   Globulin, Total 2.2 1.5 - 4.5 g/dL   Albumin/Globulin Ratio 2.0 1.2 - 2.2   Bilirubin Total 0.4 0.0 - 1.2 mg/dL   Alkaline Phosphatase 78 39 - 117 IU/L   AST 25 0 - 40 IU/L   ALT 35 0 - 44 IU/L     Assessment & Plan  1. Muscle strain of chest wall, initial encounter Ice to area that is tender.  May take Tylenol for discomfort.

## 2015-10-16 ENCOUNTER — Ambulatory Visit: Payer: Medicare Other

## 2015-10-16 ENCOUNTER — Encounter: Payer: Self-pay | Admitting: Emergency Medicine

## 2015-10-16 ENCOUNTER — Ambulatory Visit
Admission: EM | Admit: 2015-10-16 | Discharge: 2015-10-16 | Disposition: A | Discharge status: Home / Self Care | Payer: Medicare Other | Attending: Family Medicine | Admitting: Family Medicine

## 2015-10-16 DIAGNOSIS — Z885 Allergy status to narcotic agent status: Secondary | ICD-10-CM | POA: Diagnosis not present

## 2015-10-16 DIAGNOSIS — K219 Gastro-esophageal reflux disease without esophagitis: Secondary | ICD-10-CM | POA: Diagnosis not present

## 2015-10-16 DIAGNOSIS — M1990 Unspecified osteoarthritis, unspecified site: Secondary | ICD-10-CM | POA: Diagnosis not present

## 2015-10-16 DIAGNOSIS — Z79899 Other long term (current) drug therapy: Secondary | ICD-10-CM | POA: Insufficient documentation

## 2015-10-16 DIAGNOSIS — J449 Chronic obstructive pulmonary disease, unspecified: Secondary | ICD-10-CM | POA: Diagnosis not present

## 2015-10-16 DIAGNOSIS — Z9109 Other allergy status, other than to drugs and biological substances: Secondary | ICD-10-CM | POA: Diagnosis not present

## 2015-10-16 DIAGNOSIS — Z7982 Long term (current) use of aspirin: Secondary | ICD-10-CM | POA: Diagnosis not present

## 2015-10-16 DIAGNOSIS — E7800 Pure hypercholesterolemia, unspecified: Secondary | ICD-10-CM | POA: Insufficient documentation

## 2015-10-16 DIAGNOSIS — Z91040 Latex allergy status: Secondary | ICD-10-CM | POA: Insufficient documentation

## 2015-10-16 DIAGNOSIS — M940 Chondrocostal junction syndrome [Tietze]: Secondary | ICD-10-CM | POA: Insufficient documentation

## 2015-10-16 DIAGNOSIS — R0781 Pleurodynia: Secondary | ICD-10-CM | POA: Diagnosis present

## 2015-10-16 NOTE — ED Provider Notes (Signed)
CSN: [id number]     Arrival date & time 10/16/15  1057 History   First MD Initiated Contact with Patient 10/16/15 1225     Chief Complaint  Patient presents with  . rib pain    (Consider location/radiation/quality/duration/timing/severity/associated sxs/prior Treatment) HPI Comments: 80 yo male with a 1 week h/o bilateral lower rib pain. Denies any cough, fevers, chills, rash, chest pains, leg pains. States about one and a half weeks ago had a day where allergies were severe and was sneezing a lot most of the day.   The history is provided by the patient.    Past Medical History  Diagnosis Date  . Superficial hematoma   . Prostate cancer ([place])   . Hypercholesterolemia   . GERD (gastroesophageal reflux disease)   . Arthritis   . Heart disease   . H/O echocardiogram     05/2013  . Hx of colonoscopy     04/22/2013   Past Surgical History  Procedure Laterality Date  . Basal cell carcinoma excision    . Hernia repair    . Coronary artery bypass graft      X2  . Colonoscopy  05/01/2009   Family History  Problem Relation Age of Onset  . Heart disease Mother    Social History  Substance Use Topics  . Smoking status: Never Smoker   . Smokeless tobacco: Never Used  . Alcohol Use: 0.0 oz/week    0 Standard drinks or equivalent per week    Review of Systems  Allergies  Codeine; Indocin; and Latex  Home Medications   Prior to Admission medications   Medication Sig Start Date End Date Taking? Authorizing Provider  Alpha Lipoic Acid 200 MG CAPS Take 2 capsules by mouth daily.    Historical Provider, MD  amLODipine (NORVASC) 2.5 MG tablet Take 1 tablet (2.5 mg total) by mouth daily. 04/20/15   Arlis Porta., MD  aspirin 81 MG tablet Take 81 mg by mouth daily.    Historical Provider, MD  B Complex Vitamins (VITAMIN-B COMPLEX PO) Take 1 tablet by mouth daily. Reported on 10/06/2015    Historical Provider, MD  Biotin 1000 MCG tablet Take 1,000 mcg by mouth daily.     Historical Provider, MD  Calcium Carb-Cholecalciferol 500-100 MG-UNIT CHEW Chew 500 mg by mouth daily.    Historical Provider, MD  cyanocobalamin 1000 MCG tablet Take 100 mcg by mouth daily.    Historical Provider, MD  folic acid (FOLVITE) 1 MG tablet Take 1 mg by mouth daily.    Historical Provider, MD  furosemide (LASIX) 20 MG tablet Take 1 tablet (20 mg total) by mouth daily. 04/20/15   Arlis Porta., MD  gabapentin (NEURONTIN) 600 MG tablet Take 1 tablet (600 mg total) by mouth 3 (three) times daily. 04/20/15   Arlis Porta., MD  Glucosamine Sulfate 1000 MG CAPS Take 1 capsule by mouth daily.    Historical Provider, MD  hydrochlorothiazide (HYDRODIURIL) 25 MG tablet Take 1/2 tablet orally each AM 09/22/15   Arlis Porta., MD  Lecithin 400 MG CAPS Take 400 mg by mouth daily.    Historical Provider, MD  losartan (COZAAR) 50 MG tablet Take 1 tablet (50 mg total) by mouth daily. 04/20/15   Arlis Porta., MD  Melatonin 3 MG CAPS Take 3 mg by mouth at bedtime.    Historical Provider, MD  Omega 3 1000 MG CAPS Take 1,000 mg by mouth daily.  Historical Provider, MD  omeprazole (PRILOSEC) 20 MG capsule Take 1 capsule (20 mg total) by mouth daily. 04/20/15   Arlis Porta., MD  potassium chloride 20 MEQ TBCR Take 2 tablets each day 09/22/15   Arlis Porta., MD  pravastatin (PRAVACHOL) 80 MG tablet Take 1 tablet (80 mg total) by mouth daily. 04/20/15   Arlis Porta., MD  primidone (MYSOLINE) 50 MG tablet Take 1 tablet (50 mg total) by mouth 3 (three) times daily. 04/20/15   Arlis Porta., MD  Selenium 200 MCG CAPS Take 200 mcg by mouth daily.    Historical Provider, MD  tretinoin (RETIN-A) 0.05 % cream Apply 1 application topically at bedtime.    Historical Provider, MD   Meds Ordered and Administered this Visit  Medications - No data to display  BP 147/78 mmHg  Pulse 83  Temp(Src) 98.3 F (36.8 C) (Oral)  Resp 16  Ht 6' (1.829 m)  Wt 218 lb (98.884 kg)   BMI 29.56 kg/m2  SpO2 97% No data found.   Physical Exam  Constitutional: He appears well-developed and well-nourished. No distress.  Cardiovascular: Normal rate, regular rhythm and normal heart sounds.   No murmur heard. Pulmonary/Chest: Effort normal and breath sounds normal. No respiratory distress. He has no wheezes. He has no rales. He exhibits tenderness (bilateral lower rib mild-moderate tenderness to palpation).  Musculoskeletal: He exhibits no edema.  Skin: No rash noted. He is not diaphoretic.  Nursing note and vitals reviewed.   ED Course  Procedures (including critical care time)  Labs Review Labs Reviewed - No data to display  Imaging Review Dg Chest 2 View  10/16/2015  CLINICAL DATA:  Bilateral lower lateral rib pain for 1 week. No known injury. Recent sneezing. EXAM: CHEST  2 VIEW COMPARISON:  None. FINDINGS: Prior CABG. There is hyperinflation of the lungs compatible with COPD. No visible acute displaced rib fracture. Lungs are clear. No effusions. IMPRESSION: COPD.  No active disease. Electronically Signed   By: Rolm Baptise M.D.   On: 10/16/2015 12:18     Visual Acuity Review  Right Eye Distance:   Left Eye Distance:   Bilateral Distance:    Right Eye Near:   Left Eye Near:    Bilateral Near:         MDM   1. Costochondritis, acute    Discharge Medication List as of 10/16/2015  1:14 PM     1. x-ray results (negative) and diagnosis reviewed with patient 2. Recommend supportive treatment with rest, ice, otc analgesics prn 3. Follow-up prn if symptoms worsen or don't improve    Norval Gable, MD 10/16/15 1352

## 2015-10-16 NOTE — ED Notes (Signed)
Patient c/o pain on both sides of his rib cage for the past week.  Patient reports pain is worse when he takes a deep breath.  Patient denies fall or injury.  Patient denies any cold symptoms.

## 2015-10-22 DIAGNOSIS — I5022 Chronic systolic (congestive) heart failure: Secondary | ICD-10-CM | POA: Diagnosis not present

## 2015-10-22 DIAGNOSIS — I2510 Atherosclerotic heart disease of native coronary artery without angina pectoris: Secondary | ICD-10-CM | POA: Diagnosis not present

## 2015-10-22 DIAGNOSIS — R2681 Unsteadiness on feet: Secondary | ICD-10-CM | POA: Diagnosis not present

## 2015-10-22 DIAGNOSIS — I10 Essential (primary) hypertension: Secondary | ICD-10-CM | POA: Diagnosis not present

## 2015-10-22 DIAGNOSIS — E782 Mixed hyperlipidemia: Secondary | ICD-10-CM | POA: Diagnosis not present

## 2015-11-03 ENCOUNTER — Ambulatory Visit (INDEPENDENT_AMBULATORY_CARE_PROVIDER_SITE_OTHER): Payer: Medicare Other | Admitting: Family Medicine

## 2015-11-03 ENCOUNTER — Encounter: Payer: Self-pay | Admitting: Family Medicine

## 2015-11-03 VITALS — BP 100/64 | HR 85 | Temp 98.9°F | Resp 16 | Ht 72.0 in | Wt 223.0 lb

## 2015-11-03 DIAGNOSIS — R0789 Other chest pain: Secondary | ICD-10-CM

## 2015-11-03 NOTE — Progress Notes (Signed)
Name: [name]   MRN: [id number]    DOB: 1936/02/16   Date:11/03/2015       Progress Note  Subjective  Chief Complaint  Chief Complaint  Patient presents with  . Follow-up    costochondritis    HPI For f/u of rib pain.  Seen art [facility].  Dx of costochrondritis.Marland Kitchen  He is taking 2 ASA at bedtime and sleeps well.  Pain started after a large sneeze about 6 weeks ago.  Xrays at Urgent Care were wnl.  No problem-specific assessment & plan notes found for this encounter.   Past Medical History  Diagnosis Date  . Superficial hematoma   . Prostate cancer ([place])   . Hypercholesterolemia   . GERD (gastroesophageal reflux disease)   . Arthritis   . Heart disease   . H/O echocardiogram     05/2013  . Hx of colonoscopy     04/22/2013    Social History  Substance Use Topics  . Smoking status: Never Smoker   . Smokeless tobacco: Never Used  . Alcohol Use: 0.0 oz/week    0 Standard drinks or equivalent per week     Current outpatient prescriptions:  .  Alpha Lipoic Acid 200 MG CAPS, Take 2 capsules by mouth daily., Disp: , Rfl:  .  amLODipine (NORVASC) 2.5 MG tablet, Take 1 tablet (2.5 mg total) by mouth daily., Disp: 90 tablet, Rfl: 2 .  aspirin 81 MG tablet, Take 81 mg by mouth daily., Disp: , Rfl:  .  B Complex Vitamins (VITAMIN-B COMPLEX PO), Take 1 tablet by mouth daily. Reported on 10/06/2015, Disp: , Rfl:  .  Biotin 1000 MCG tablet, Take 1,000 mcg by mouth daily., Disp: , Rfl:  .  Calcium Carb-Cholecalciferol 500-100 MG-UNIT CHEW, Chew 500 mg by mouth daily., Disp: , Rfl:  .  cyanocobalamin 1000 MCG tablet, Take 100 mcg by mouth daily., Disp: , Rfl:  .  folic acid (FOLVITE) 1 MG tablet, Take 1 mg by mouth daily., Disp: , Rfl:  .  furosemide (LASIX) 20 MG tablet, Take 1 tablet (20 mg total) by mouth daily., Disp: 90 tablet, Rfl: 2 .  gabapentin (NEURONTIN) 600 MG tablet, Take 1 tablet (600 mg total) by mouth 3 (three) times daily., Disp: 360 tablet, Rfl: 3 .   Glucosamine Sulfate 1000 MG CAPS, Take 1 capsule by mouth daily., Disp: , Rfl:  .  hydrochlorothiazide (HYDRODIURIL) 25 MG tablet, Take 1/2 tablet orally each AM, Disp: 90 tablet, Rfl: 2 .  Lecithin 400 MG CAPS, Take 400 mg by mouth daily., Disp: , Rfl:  .  losartan (COZAAR) 50 MG tablet, Take 1 tablet (50 mg total) by mouth daily., Disp: 90 tablet, Rfl: 2 .  Melatonin 3 MG CAPS, Take 3 mg by mouth at bedtime., Disp: , Rfl:  .  Omega 3 1000 MG CAPS, Take 1,000 mg by mouth daily., Disp: , Rfl:  .  omeprazole (PRILOSEC) 20 MG capsule, Take 1 capsule (20 mg total) by mouth daily., Disp: 90 capsule, Rfl: 3 .  potassium chloride 20 MEQ TBCR, Take 2 tablets each day, Disp: 180 tablet, Rfl: 3 .  pravastatin (PRAVACHOL) 80 MG tablet, Take 1 tablet (80 mg total) by mouth daily., Disp: 90 tablet, Rfl: 2 .  primidone (MYSOLINE) 50 MG tablet, Take 1 tablet (50 mg total) by mouth 3 (three) times daily., Disp: 360 tablet, Rfl: 2 .  Selenium 200 MCG CAPS, Take 200 mcg by mouth daily., Disp: , Rfl:  .  tretinoin (RETIN-A) 0.05 % cream, Apply 1 application topically at bedtime., Disp: , Rfl:   Allergies  Allergen Reactions  . Codeine Other (See Comments)  . Indocin [Indomethacin] Other (See Comments)  . Latex Other (See Comments)    Review of Systems  Constitutional: Negative for fever, chills, weight loss and malaise/fatigue.  HENT: Negative for hearing loss.   Eyes: Negative for blurred vision and double vision.  Respiratory: Negative for cough, shortness of breath and wheezing.   Cardiovascular: Positive for chest pain. Negative for palpitations and leg swelling.  Gastrointestinal: Positive for heartburn and abdominal pain. Negative for blood in stool.  Genitourinary: Negative for dysuria, urgency and frequency.  Musculoskeletal: Positive for myalgias.  Skin: Negative for rash.  Neurological: Negative for tremors, weakness and headaches.      Objective  Filed Vitals:   11/03/15 1356  BP:  100/64  Pulse: 85  Temp: 98.9 F (37.2 C)  TempSrc: Oral  Resp: 16  Height: 6' (1.829 m)  Weight: 223 lb (101.152 kg)     Physical Exam  Constitutional: He is well-developed, well-nourished, and in no distress. No distress.  HENT:  Head: Normocephalic and atraumatic.  Cardiovascular: Normal rate, regular rhythm and normal heart sounds.   Pulmonary/Chest: Effort normal and breath sounds normal. No respiratory distress. He has no wheezes. He has no rales.  Bilateral mid rib cage tenderness to palpation..  Pain is minimal  Vitals reviewed.     Recent Results (from the past 2160 hour(s))  Lipid Profile     Status: Abnormal   Collection Time: 09/29/15  8:45 AM  Result Value Ref Range   Cholesterol, Total 182 100 - 199 mg/dL   Triglycerides 220 (H) 0 - 149 mg/dL   HDL 75 >39 mg/dL   VLDL Cholesterol Cal 44 (H) 5 - 40 mg/dL   LDL Calculated 63 0 - 99 mg/dL   Chol/HDL Ratio 2.4 0.0 - 5.0 ratio units    Comment:                                   T. Chol/HDL Ratio                                             Men  Women                               1/2 Avg.Risk  3.4    3.3                                   Avg.Risk  5.0    4.4                                2X Avg.Risk  9.6    7.1                                3X Avg.Risk 23.4   11.0   CBC with Differential     Status: Abnormal   Collection Time: 09/29/15  8:45 AM  Result Value Ref Range  WBC 3.7 3.4 - 10.8 x10E3/uL   RBC 3.61 (L) 4.14 - 5.80 x10E6/uL   Hemoglobin 12.7 12.6 - 17.7 g/dL   Hematocrit 37.9 37.5 - 51.0 %   MCV 105 (H) 79 - 97 fL   MCH 35.2 (H) 26.6 - 33.0 pg   MCHC 33.5 31.5 - 35.7 g/dL   RDW 13.6 12.3 - 15.4 %   Platelets 263 150 - 379 x10E3/uL   Neutrophils 47 %   Lymphs 39 %   Monocytes 10 %   Eos 3 %   Basos 1 %   Neutrophils Absolute 1.7 1.4 - 7.0 x10E3/uL   Lymphocytes Absolute 1.4 0.7 - 3.1 x10E3/uL   Monocytes Absolute 0.4 0.1 - 0.9 x10E3/uL   EOS (ABSOLUTE) 0.1 0.0 - 0.4 x10E3/uL   Basophils  Absolute 0.0 0.0 - 0.2 x10E3/uL   Immature Granulocytes 0 %   Immature Grans (Abs) 0.0 0.0 - 0.1 x10E3/uL  Comprehensive Metabolic Panel (CMET)     Status: Abnormal   Collection Time: 09/29/15  8:45 AM  Result Value Ref Range   Glucose 92 65 - 99 mg/dL   BUN 10 8 - 27 mg/dL   Creatinine, Ser 0.82 0.76 - 1.27 mg/dL   GFR calc non Af Amer 85 >59 mL/min/1.73   GFR calc Af Amer 98 >59 mL/min/1.73   BUN/Creatinine Ratio 12 10 - 24   Sodium 140 134 - 144 mmol/L   Potassium 4.2 3.5 - 5.2 mmol/L   Chloride 95 (L) 96 - 106 mmol/L   CO2 26 18 - 29 mmol/L   Calcium 9.3 8.6 - 10.2 mg/dL   Total Protein 6.6 6.0 - 8.5 g/dL   Albumin 4.4 3.5 - 4.8 g/dL   Globulin, Total 2.2 1.5 - 4.5 g/dL   Albumin/Globulin Ratio 2.0 1.2 - 2.2   Bilirubin Total 0.4 0.0 - 1.2 mg/dL   Alkaline Phosphatase 78 39 - 117 IU/L   AST 25 0 - 40 IU/L   ALT 35 0 - 44 IU/L     Assessment & Plan 1. Chest wall pain OK to take 2 ASA three times a day until rib cage pain resoilves

## 2015-11-10 ENCOUNTER — Encounter: Payer: Self-pay | Admitting: *Deleted

## 2015-11-20 DIAGNOSIS — L97522 Non-pressure chronic ulcer of other part of left foot with fat layer exposed: Secondary | ICD-10-CM | POA: Diagnosis not present

## 2015-11-23 DIAGNOSIS — M940 Chondrocostal junction syndrome [Tietze]: Secondary | ICD-10-CM | POA: Diagnosis not present

## 2015-11-27 DIAGNOSIS — L97522 Non-pressure chronic ulcer of other part of left foot with fat layer exposed: Secondary | ICD-10-CM | POA: Diagnosis not present

## 2015-12-07 DIAGNOSIS — L97522 Non-pressure chronic ulcer of other part of left foot with fat layer exposed: Secondary | ICD-10-CM | POA: Diagnosis not present

## 2015-12-08 ENCOUNTER — Encounter: Payer: Self-pay | Admitting: *Deleted

## 2015-12-10 ENCOUNTER — Encounter: Payer: Self-pay | Admitting: General Surgery

## 2015-12-10 ENCOUNTER — Ambulatory Visit
Admission: EM | Admit: 2015-12-10 | Discharge: 2015-12-10 | Disposition: A | Discharge status: Home / Self Care | Payer: Medicare Other | Attending: Family Medicine | Admitting: Family Medicine

## 2015-12-10 ENCOUNTER — Ambulatory Visit: Payer: Medicare Other

## 2015-12-10 DIAGNOSIS — K219 Gastro-esophageal reflux disease without esophagitis: Secondary | ICD-10-CM | POA: Insufficient documentation

## 2015-12-10 DIAGNOSIS — L97522 Non-pressure chronic ulcer of other part of left foot with fat layer exposed: Secondary | ICD-10-CM | POA: Insufficient documentation

## 2015-12-10 DIAGNOSIS — Z8546 Personal history of malignant neoplasm of prostate: Secondary | ICD-10-CM | POA: Insufficient documentation

## 2015-12-10 DIAGNOSIS — Z7982 Long term (current) use of aspirin: Secondary | ICD-10-CM | POA: Diagnosis not present

## 2015-12-10 DIAGNOSIS — M79675 Pain in left toe(s): Secondary | ICD-10-CM | POA: Diagnosis present

## 2015-12-10 DIAGNOSIS — E7800 Pure hypercholesterolemia, unspecified: Secondary | ICD-10-CM | POA: Diagnosis not present

## 2015-12-10 DIAGNOSIS — Z79899 Other long term (current) drug therapy: Secondary | ICD-10-CM | POA: Diagnosis not present

## 2015-12-10 DIAGNOSIS — G629 Polyneuropathy, unspecified: Secondary | ICD-10-CM | POA: Diagnosis not present

## 2015-12-10 DIAGNOSIS — S91102A Unspecified open wound of left great toe without damage to nail, initial encounter: Secondary | ICD-10-CM | POA: Diagnosis not present

## 2015-12-10 MED ORDER — DOXYCYCLINE HYCLATE 100 MG PO CAPS: 100.0000 mg | ORAL_CAPSULE | Freq: Two times a day (BID) | ORAL | Status: DC

## 2015-12-10 MED ORDER — MUPIROCIN 2 % EX OINT: 1.0000 "application " | TOPICAL_OINTMENT | Freq: Three times a day (TID) | CUTANEOUS | Status: DC

## 2015-12-10 NOTE — ED Notes (Addendum)
Patient complains of left foot big toe pain. Patient states that this started around 3 weeks ago. Patient states that his shoe rubbed a sore on it. Patient states that he recently returned from [place]. Patient states that he has neuropathy on his feet so he has little feeling on his toe.

## 2015-12-10 NOTE — ED Provider Notes (Signed)
CSN: [id number]     Arrival date & time 12/10/15  1357 History   First MD Initiated Contact with Patient 12/10/15 1455     Chief Complaint  Patient presents with  . Toe Pain   (Consider location/radiation/quality/duration/timing/severity/associated sxs/prior Treatment) HPI  This a 80 year old male presents with a chronic wound over the IP joint of his first toe on the left. He just returned from [place] and while there had seen a podiatrist who had started treatment with a "medicated bandage" and small doughnut to prevent pressure. The patient does have peripheral neuropathy but no diabetes. He wears bilateral AFOs because of foot drop. No drainage but the wound appears to be enlarging up. Other than his current shoe wear he was told that the AFOs would not T other shoes.     Past Medical History  Diagnosis Date  . Superficial hematoma   . Prostate cancer ([place])   . Hypercholesterolemia   . GERD (gastroesophageal reflux disease)   . Arthritis   . Heart disease   . H/O echocardiogram     05/2013  . Hx of colonoscopy     04/22/2013   Past Surgical History  Procedure Laterality Date  . Basal cell carcinoma excision    . Hernia repair    . Coronary artery bypass graft      X2  . Colonoscopy  05/01/2009   Family History  Problem Relation Age of Onset  . Heart disease Mother    Social History  Substance Use Topics  . Smoking status: Never Smoker   . Smokeless tobacco: Never Used  . Alcohol Use: 0.0 oz/week    0 Standard drinks or equivalent per week     Comment: wine in the evening    Review of Systems  Constitutional: Positive for activity change. Negative for fever, chills and fatigue.  Skin: Positive for wound.  All other systems reviewed and are negative.   Allergies  Codeine; Indocin; and Latex  Home Medications   Prior to Admission medications   Medication Sig Start Date End Date Taking? Authorizing Provider  Alpha Lipoic Acid 200 MG CAPS Take 2 capsules by  mouth daily.   Yes Historical Provider, MD  amLODipine (NORVASC) 2.5 MG tablet Take 1 tablet (2.5 mg total) by mouth daily. 04/20/15  Yes Arlis Porta., MD  aspirin 81 MG tablet Take 81 mg by mouth daily.   Yes Historical Provider, MD  B Complex Vitamins (VITAMIN-B COMPLEX PO) Take 1 tablet by mouth daily. Reported on 10/06/2015   Yes Historical Provider, MD  Biotin 1000 MCG tablet Take 1,000 mcg by mouth daily.   Yes Historical Provider, MD  Calcium Carb-Cholecalciferol 500-100 MG-UNIT CHEW Chew 500 mg by mouth daily.   Yes Historical Provider, MD  cyanocobalamin 1000 MCG tablet Take 100 mcg by mouth daily.   Yes Historical Provider, MD  folic acid (FOLVITE) 1 MG tablet Take 1 mg by mouth daily.   Yes Historical Provider, MD  furosemide (LASIX) 20 MG tablet Take 1 tablet (20 mg total) by mouth daily. 04/20/15  Yes Arlis Porta., MD  gabapentin (NEURONTIN) 600 MG tablet Take 1 tablet (600 mg total) by mouth 3 (three) times daily. 04/20/15  Yes Arlis Porta., MD  Glucosamine Sulfate 1000 MG CAPS Take 1 capsule by mouth daily.   Yes Historical Provider, MD  hydrochlorothiazide (HYDRODIURIL) 25 MG tablet Take 1/2 tablet orally each AM 09/22/15  Yes Arlis Porta., MD  Lecithin 400  MG CAPS Take 400 mg by mouth daily.   Yes Historical Provider, MD  losartan (COZAAR) 50 MG tablet Take 1 tablet (50 mg total) by mouth daily. 04/20/15  Yes Arlis Porta., MD  Melatonin 3 MG CAPS Take 3 mg by mouth at bedtime.   Yes Historical Provider, MD  Omega 3 1000 MG CAPS Take 1,000 mg by mouth daily.   Yes Historical Provider, MD  omeprazole (PRILOSEC) 20 MG capsule Take 1 capsule (20 mg total) by mouth daily. 04/20/15  Yes Arlis Porta., MD  potassium chloride 20 MEQ TBCR Take 2 tablets each day 09/22/15  Yes Arlis Porta., MD  pravastatin (PRAVACHOL) 80 MG tablet Take 1 tablet (80 mg total) by mouth daily. 04/20/15  Yes Arlis Porta., MD  primidone (MYSOLINE) 50 MG tablet Take 1  tablet (50 mg total) by mouth 3 (three) times daily. 04/20/15  Yes Arlis Porta., MD  Selenium 200 MCG CAPS Take 200 mcg by mouth daily.   Yes Historical Provider, MD  tretinoin (RETIN-A) 0.05 % cream Apply 1 application topically at bedtime.   Yes Historical Provider, MD  doxycycline (VIBRAMYCIN) 100 MG capsule Take 1 capsule (100 mg total) by mouth 2 (two) times daily. 12/10/15   Lorin Picket, PA-C  mupirocin ointment (BACTROBAN) 2 % Apply 1 application topically 3 (three) times daily. 12/10/15   Lorin Picket, PA-C   Meds Ordered and Administered this Visit  Medications - No data to display  BP 130/75 mmHg  Pulse 82  Temp(Src) 98 F (36.7 C) (Oral)  Resp 16  Ht 6' (1.829 m)  Wt 218 lb (98.884 kg)  BMI 29.56 kg/m2  SpO2 97% No data found.   Physical Exam  Constitutional: He is oriented to person, place, and time. He appears well-developed and well-nourished. No distress.  HENT:  Head: Normocephalic and atraumatic.  Eyes: Conjunctivae are normal. Pupils are equal, round, and reactive to light.  Neck: Normal range of motion. Neck supple.  Musculoskeletal: He exhibits edema and tenderness.  Examination of the left great toe over the IP joint medially shows a ulcerated area rocks made 5 mm in diameter with granulation in the center. There is blanchable erythema surrounding the area. Is no drainage.  Neurological: He is alert and oriented to person, place, and time.  Skin: Skin is warm and dry. He is not diaphoretic. There is erythema.  Psychiatric: He has a normal mood and affect. His behavior is normal. Judgment and thought content normal.  Nursing note and vitals reviewed.   ED Course  Procedures (including critical care time)  Labs Review Labs Reviewed - No data to display  Imaging Review Dg Toe Great Left  12/10/2015  CLINICAL DATA:  Nonhealing wound the left great toe EXAM: LEFT GREAT TOE COMPARISON:  04/06/2012 FINDINGS: Three views of the left great toe  submitted. No acute fracture or subluxation. Mild degenerative changes interphalangeal joint. No bone destruction to suggest osteomyelitis. IMPRESSION: No acute fracture or subluxation. No bone destruction to suggest osteomyelitis. Electronically Signed   By: Lahoma Crocker M.D.   On: 12/10/2015 15:38     Visual Acuity Review  Right Eye Distance:   Left Eye Distance:   Bilateral Distance:    Right Eye Near:   Left Eye Near:    Bilateral Near:         MDM   1. 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 Test/x-ray results and diagnosis reviewed with patient 2. rx as per orders; risks, benefits, potential side effects reviewed with patient 3. Recommend supportive treatment with Pressure prevention. Start him on by mouth antibiotics and topical bactroban. Will refer him for a podiatrist for ongoing care. 4. F/u prn if symptoms worsen or don't improve     Lorin Picket, PA-C 12/10/15 1604

## 2015-12-10 NOTE — Discharge Instructions (Signed)
Diabetes and Foot Care Diabetes may cause you to have problems because of poor blood supply (circulation) to your feet and legs. This may cause the skin on your feet to become thinner, break easier, and heal more slowly. Your skin may become dry, and the skin may peel and crack. You may also have nerve damage in your legs and feet causing decreased feeling in them. You may not notice minor injuries to your feet that could lead to infections or more serious problems. Taking care of your feet is one of the most important things you can do for yourself.  HOME CARE INSTRUCTIONS  Wear shoes at all times, even in the house. Do not go barefoot. Bare feet are easily injured.  Check your feet daily for blisters, cuts, and redness. If you cannot see the bottom of your feet, use a mirror or ask someone for help.  Wash your feet with warm water (do not use hot water) and mild soap. Then pat your feet and the areas between your toes until they are completely dry. Do not soak your feet as this can dry your skin.  Apply a moisturizing lotion or petroleum jelly (that does not contain alcohol and is unscented) to the skin on your feet and to dry, brittle toenails. Do not apply lotion between your toes.  Trim your toenails straight across. Do not dig under them or around the cuticle. File the edges of your nails with an emery board or nail file.  Do not cut corns or calluses or try to remove them with medicine.  Wear clean socks or stockings every day. Make sure they are not too tight. Do not wear knee-high stockings since they may decrease blood flow to your legs.  Wear shoes that fit properly and have enough cushioning. To break in new shoes, wear them for just a few hours a day. This prevents you from injuring your feet. Always look in your shoes before you put them on to be sure there are no objects inside.  Do not cross your legs. This may decrease the blood flow to your feet.  If you find a minor scrape,  cut, or break in the skin on your feet, keep it and the skin around it clean and dry. These areas may be cleansed with mild soap and water. Do not cleanse the area with peroxide, alcohol, or iodine.  When you remove an adhesive bandage, be sure not to damage the skin around it.  If you have a wound, look at it several times a day to make sure it is healing.  Do not use heating pads or hot water bottles. They may burn your skin. If you have lost feeling in your feet or legs, you may not know it is happening until it is too late.  Make sure your health care provider performs a complete foot exam at least annually or more often if you have foot problems. Report any cuts, sores, or bruises to your health care provider immediately. SEEK MEDICAL CARE IF:   You have an injury that is not healing.  You have cuts or breaks in the skin.  You have an ingrown nail.  You notice redness on your legs or feet.  You feel burning or tingling in your legs or feet.  You have pain or cramps in your legs and feet.  Your legs or feet are numb.  Your feet always feel cold. SEEK IMMEDIATE MEDICAL CARE IF:   There is increasing redness,  Your legs or feet are numb.  · Your feet always feel cold.  SEEK IMMEDIATE MEDICAL CARE IF:   · There is increasing redness, swelling, or pain in or around a wound.  · There is a red line that goes up your leg.  · Pus is coming from a wound.  · You develop a fever or as directed by your health care provider.  · You notice a bad smell coming from an ulcer or wound.     This information is not intended to replace advice given to you by your health care provider. Make sure you discuss any questions you have with your health care provider.     Document Released: 05/27/2000 Document Revised: 01/30/2013 Document Reviewed: 11/06/2012  Elsevier Interactive Patient Education ©2016 [employer]  Skin Ulcer  A skin ulcer is an open sore that can be shallow or deep. Skin ulcers sometimes become infected and are difficult to treat. It may be 1 month or longer before real healing progress is made.  CAUSES    · Injury.  · Problems with the veins or arteries.  · Diabetes.  · Insect bites.  · Bedsores.  · Inflammatory conditions.  SYMPTOMS   · Pain, redness, swelling, and tenderness around the ulcer.  · Fever.  · Bleeding from the ulcer.  · Yellow or clear fluid coming from the ulcer.  DIAGNOSIS   There are many types of skin ulcers. Any open sores will be examined. Certain tests will be done to determine the kind of ulcer you have. The right treatment depends on the type of ulcer you have.  TREATMENT   Treatment is a long-term challenge. It may include:  · Wearing an elastic wrap, compression stockings, or gel cast over the ulcer area.  · Taking antibiotic medicines or putting antibiotic creams on the affected area if there is an infection.  HOME CARE INSTRUCTIONS  · Put on your bandages (dressings), wraps, or casts over the ulcer as directed by your caregiver.  · Change all dressings as directed by your caregiver.  · Take all medicines as directed by your caregiver.  · Keep the affected area clean and dry.  · Avoid injuries to the affected area.  · Eat a well-balanced, healthy diet that includes plenty of fruit and vegetables.  · If you smoke, consider quitting or decreasing the amount of cigarettes you smoke.  · Once the ulcer heals, get regular exercise as directed by your caregiver.  · Work with your caregiver to make sure your blood pressure, cholesterol, and diabetes are well-controlled.  · Keep your skin moisturized. Dry skin can crack and lead to skin ulcers.  SEEK IMMEDIATE MEDICAL CARE IF:   · Your pain gets worse.  · You have swelling, redness, or fluids around the ulcer.  · You have chills.  · You have a fever.  MAKE SURE YOU:   · Understand these instructions.  · Will watch your condition.  · Will get help right away if you are not doing well or get worse.     This information is not intended to replace advice given to you by your health care provider. Make sure you discuss any questions you have with your  health care provider.     Document Released: 07/07/2004 Document Revised: 08/22/2011 Document Reviewed: 01/14/2011  Elsevier Interactive Patient Education ©2016 [employer] Inc[employer]

## 2015-12-16 DIAGNOSIS — G609 Hereditary and idiopathic neuropathy, unspecified: Secondary | ICD-10-CM | POA: Diagnosis not present

## 2015-12-16 DIAGNOSIS — L97521 Non-pressure chronic ulcer of other part of left foot limited to breakdown of skin: Secondary | ICD-10-CM | POA: Diagnosis not present

## 2015-12-17 ENCOUNTER — Ambulatory Visit (INDEPENDENT_AMBULATORY_CARE_PROVIDER_SITE_OTHER): Payer: Medicare Other | Admitting: General Surgery

## 2015-12-17 ENCOUNTER — Encounter: Payer: Self-pay | Admitting: General Surgery

## 2015-12-17 VITALS — BP 132/60 | HR 70 | Resp 14 | Ht 72.0 in | Wt 217.0 lb

## 2015-12-17 DIAGNOSIS — Z86010 Personal history of colonic polyps: Secondary | ICD-10-CM | POA: Diagnosis not present

## 2015-12-17 MED ORDER — POLYETHYLENE GLYCOL 3350 17 GM/SCOOP PO POWD: ORAL | Status: DC

## 2015-12-17 NOTE — Patient Instructions (Signed)
.  Colonoscopy A colonoscopy is an exam to look at the entire large intestine (colon). This exam can help find problems such as tumors, polyps, inflammation, and areas of bleeding. The exam takes about 1 hour.  LET YOUR HEALTH CARE PROVIDER KNOW ABOUT:   Any allergies you have.  All medicines you are taking, including vitamins, herbs, eye drops, creams, and over-the-counter medicines.  Previous problems you or members of your family have had with the use of anesthetics.  Any blood disorders you have.  Previous surgeries you have had.  Medical conditions you have. RISKS AND COMPLICATIONS  Generally, this is a safe procedure. However, as with any procedure, complications can occur. Possible complications include:  Bleeding.  Tearing or rupture of the colon wall.  Reaction to medicines given during the exam.  Infection (rare). BEFORE THE PROCEDURE   Ask your health care provider about changing or stopping your regular medicines.  You may be prescribed an oral bowel prep. This involves drinking a large amount of medicated liquid, starting the day before your procedure. The liquid will cause you to have multiple loose stools until your stool is almost clear or light green. This cleans out your colon in preparation for the procedure.  Do not eat or drink anything else once you have started the bowel prep, unless your health care provider tells you it is safe to do so.  Arrange for someone to drive you home after the procedure. PROCEDURE   You will be given medicine to help you relax (sedative).  You will lie on your side with your knees bent.  A long, flexible tube with a light and camera on the end (colonoscope) will be inserted through the rectum and into the colon. The camera sends video back to a computer screen as it moves through the colon. The colonoscope also releases carbon dioxide gas to inflate the colon. This helps your health care provider see the area better.  During  the exam, your health care provider may take a small tissue sample (biopsy) to be examined under a microscope if any abnormalities are found.  The exam is finished when the entire colon has been viewed. AFTER THE PROCEDURE   Do not drive for 24 hours after the exam.  You may have a small amount of blood in your stool.  You may pass moderate amounts of gas and have mild abdominal cramping or bloating. This is caused by the gas used to inflate your colon during the exam.  Ask when your test results will be ready and how you will get your results. Make sure you get your test results.   This information is not intended to replace advice given to you by your health care provider. Make sure you discuss any questions you have with your health care provider.   Document Released: 05/27/2000 Document Revised: 03/20/2013 Document Reviewed: 02/04/2013 Elsevier Interactive Patient Education 2016 [employer] Inc.  

## 2015-12-17 NOTE — Progress Notes (Signed)
Patient ID: [name], male   DOB: 05/31/1936, 80 y.o.   MRN: [id number]  Chief Complaint  Patient presents with  . Colonoscopy    HPI [name] is a 80 y.o. male.  Who presents for a colonoscopy discussion. The last colonoscopy was completed in 2010. Denies any gastrointestinal issues. Bowels move regular and no bleeding noted. Patient was unsure of needing a colonoscopy due to his age-he has had prior colon polyps I have reviewed the history of present illness with the patient. HPI  Past Medical History  Diagnosis Date  . Superficial hematoma   . Prostate cancer ([place]) 2004  . Hypercholesterolemia   . GERD (gastroesophageal reflux disease)   . Arthritis   . Heart disease   . H/O echocardiogram     05/2013  . Hx of colonoscopy     04/22/2013  . Fracture, femur ([place]) 2012  . Polyneuropathy ([place])   . Benign essential tremor     Past Surgical History  Procedure Laterality Date  . Basal cell carcinoma excision    . Hernia repair  1990  . Coronary artery bypass graft  1987    X2  . Colonoscopy  05/01/2009    Positive for colonic polyps  . Prostate surgery  2004    Family History  Problem Relation Age of Onset  . Heart disease Mother     Social History Social History  Substance Use Topics  . Smoking status: Never Smoker   . Smokeless tobacco: Never Used  . Alcohol Use: 0.0 oz/week    0 Standard drinks or equivalent per week     Comment: wine in the evening    Allergies  Allergen Reactions  . Codeine Other (See Comments)  . Indocin [Indomethacin] Other (See Comments)  . Latex Other (See Comments)  . Other Nausea And Vomiting    mussels    Current Outpatient Prescriptions  Medication Sig Dispense Refill  . Alpha Lipoic Acid 200 MG CAPS Take 2 capsules by mouth daily.    Marland Kitchen amLODipine (NORVASC) 2.5 MG tablet Take 1 tablet (2.5 mg total) by mouth daily. 90 tablet 2  . aspirin 81 MG tablet Take 81 mg by mouth daily.    . B Complex Vitamins (VITAMIN-B COMPLEX PO)  Take 1 tablet by mouth daily. Reported on 10/06/2015    . Biotin 1000 MCG tablet Take 1,000 mcg by mouth daily.    . Calcium Carb-Cholecalciferol 500-100 MG-UNIT CHEW Chew 500 mg by mouth daily.    . cyanocobalamin 1000 MCG tablet Take 100 mcg by mouth daily.    Marland Kitchen doxycycline (VIBRAMYCIN) 100 MG capsule Take 1 capsule (100 mg total) by mouth 2 (two) times daily. 10 capsule 0  . folic acid (FOLVITE) 1 MG tablet Take 1 mg by mouth daily.    . furosemide (LASIX) 20 MG tablet Take 1 tablet (20 mg total) by mouth daily. 90 tablet 2  . gabapentin (NEURONTIN) 600 MG tablet Take 1 tablet (600 mg total) by mouth 3 (three) times daily. 360 tablet 3  . Glucosamine Sulfate 1000 MG CAPS Take 1 capsule by mouth daily.    . hydrochlorothiazide (HYDRODIURIL) 25 MG tablet Take 1/2 tablet orally each AM 90 tablet 2  . Lecithin 400 MG CAPS Take 400 mg by mouth daily.    Marland Kitchen losartan (COZAAR) 50 MG tablet Take 1 tablet (50 mg total) by mouth daily. 90 tablet 2  . Melatonin 3 MG CAPS Take 3 mg by mouth at bedtime.    Marland Kitchen  mupirocin ointment (BACTROBAN) 2 % Apply 1 application topically 3 (three) times daily. 22 g 0  . Omega 3 1000 MG CAPS Take 1,000 mg by mouth daily.    Marland Kitchen omeprazole (PRILOSEC) 20 MG capsule Take 1 capsule (20 mg total) by mouth daily. 90 capsule 3  . potassium chloride 20 MEQ TBCR Take 2 tablets each day 180 tablet 3  . pravastatin (PRAVACHOL) 80 MG tablet Take 1 tablet (80 mg total) by mouth daily. 90 tablet 2  . primidone (MYSOLINE) 50 MG tablet Take 1 tablet (50 mg total) by mouth 3 (three) times daily. 360 tablet 2  . Selenium 200 MCG CAPS Take 200 mcg by mouth daily.    Marland Kitchen tretinoin (RETIN-A) 0.05 % cream Apply 1 application topically at bedtime.     No current facility-administered medications for this visit.    Review of Systems Review of Systems  Constitutional: Negative.   Respiratory: Negative.   Cardiovascular: Negative.   Gastrointestinal: Negative.     Blood pressure 132/60, pulse  70, resp. rate 14, height 6' (1.829 m), weight 217 lb (98.431 kg).  Physical Exam Physical Exam  Constitutional: He is oriented to person, place, and time. He appears well-developed and well-nourished.  Eyes: Conjunctivae are normal. No scleral icterus.  Neck: Neck supple. No thyromegaly present.  Cardiovascular: Normal rate, regular rhythm and normal heart sounds.   Pulmonary/Chest: Effort normal and breath sounds normal.  Abdominal: Soft. Bowel sounds are normal. There is no hepatomegaly. There is no tenderness. A hernia (1 cm umbilical) is present.    Neurological: He is alert and oriented to person, place, and time.  Skin: Skin is warm and dry.    Data Reviewed Prior notes and colonoscopy procedure notes  Assessment    Hx of colonic polyps     Plan    Colonoscopy with possible biopsy/polypectomy prn: Information regarding the procedure, including its potential risks and complications (including but not limited to perforation of the bowel, which may require emergency surgery to repair, and bleeding) was verbally given to the patient. Educational information regarding lower intestinal endoscopy was given to the patient. Written instructions for how to complete the bowel prep using Miralax were provided. The importance of drinking ample fluids to avoid dehydration as a result of the prep emphasized.  Patient has been scheduled for a colonoscopy on 02-24-16 at [facility]. Patient has been asked to discontinue fish oil one week prior to procedure. It is okay for patient to continue 81 mg aspirin once daily. He will take amlodipine, furosemide, and hctz the morning of procedure with a small sip of water.      PCP:  Luan Pulling  This information has been scribed by Karie Fetch RN, BSN,BC.   SANKAR,SEEPLAPUTHUR G 12/17/2015, 12:03 PM

## 2015-12-21 ENCOUNTER — Encounter: Payer: Self-pay | Admitting: General Surgery

## 2015-12-23 ENCOUNTER — Ambulatory Visit
Admission: EM | Admit: 2015-12-23 | Discharge: 2015-12-23 | Disposition: A | Discharge status: Home / Self Care | Payer: Medicare Other | Attending: Family Medicine | Admitting: Family Medicine

## 2015-12-23 ENCOUNTER — Encounter: Payer: Self-pay | Admitting: *Deleted

## 2015-12-23 ENCOUNTER — Other Ambulatory Visit: Payer: Self-pay | Admitting: Family Medicine

## 2015-12-23 DIAGNOSIS — K219 Gastro-esophageal reflux disease without esophagitis: Secondary | ICD-10-CM | POA: Insufficient documentation

## 2015-12-23 DIAGNOSIS — K529 Noninfective gastroenteritis and colitis, unspecified: Secondary | ICD-10-CM | POA: Diagnosis not present

## 2015-12-23 DIAGNOSIS — R079 Chest pain, unspecified: Secondary | ICD-10-CM | POA: Diagnosis present

## 2015-12-23 DIAGNOSIS — Z7982 Long term (current) use of aspirin: Secondary | ICD-10-CM | POA: Insufficient documentation

## 2015-12-23 DIAGNOSIS — G250 Essential tremor: Secondary | ICD-10-CM | POA: Insufficient documentation

## 2015-12-23 DIAGNOSIS — Z8546 Personal history of malignant neoplasm of prostate: Secondary | ICD-10-CM | POA: Diagnosis not present

## 2015-12-23 DIAGNOSIS — G629 Polyneuropathy, unspecified: Secondary | ICD-10-CM | POA: Diagnosis not present

## 2015-12-23 DIAGNOSIS — E871 Hypo-osmolality and hyponatremia: Secondary | ICD-10-CM | POA: Diagnosis not present

## 2015-12-23 DIAGNOSIS — E7800 Pure hypercholesterolemia, unspecified: Secondary | ICD-10-CM | POA: Insufficient documentation

## 2015-12-23 DIAGNOSIS — Z79899 Other long term (current) drug therapy: Secondary | ICD-10-CM | POA: Diagnosis not present

## 2015-12-23 DIAGNOSIS — E876 Hypokalemia: Secondary | ICD-10-CM | POA: Insufficient documentation

## 2015-12-23 DIAGNOSIS — R110 Nausea: Secondary | ICD-10-CM | POA: Diagnosis present

## 2015-12-23 LAB — CBC WITH DIFFERENTIAL/PLATELET
Basophils Absolute: 0 10*3/uL (ref 0–0.1)
Basophils Relative: 0 %
EOS ABS: 0.1 10*3/uL (ref 0–0.7)
Eosinophils Relative: 1 %
HEMATOCRIT: 39.2 % — AB (ref 40.0–52.0)
HEMOGLOBIN: 13.6 g/dL (ref 13.0–18.0)
LYMPHS ABS: 1 10*3/uL (ref 1.0–3.6)
Lymphocytes Relative: 17 %
MCH: 35.3 pg — AB (ref 26.0–34.0)
MCHC: 34.7 g/dL (ref 32.0–36.0)
MCV: 101.8 fL — ABNORMAL HIGH (ref 80.0–100.0)
MONO ABS: 0.6 10*3/uL (ref 0.2–1.0)
MONOS PCT: 10 %
NEUTROS PCT: 72 %
Neutro Abs: 4.2 10*3/uL (ref 1.4–6.5)
Platelets: 243 10*3/uL (ref 150–440)
RBC: 3.85 MIL/uL — ABNORMAL LOW (ref 4.40–5.90)
RDW: 12.9 % (ref 11.5–14.5)
WBC: 5.9 10*3/uL (ref 3.8–10.6)

## 2015-12-23 LAB — BASIC METABOLIC PANEL
Anion gap: 12 (ref 5–15)
BUN: 10 mg/dL (ref 6–20)
CALCIUM: 9.7 mg/dL (ref 8.9–10.3)
CHLORIDE: 91 mmol/L — AB (ref 101–111)
CO2: 28 mmol/L (ref 22–32)
CREATININE: 0.8 mg/dL (ref 0.61–1.24)
GFR calc non Af Amer: >60 mL/min (ref >60)
GLUCOSE: 116 mg/dL — AB (ref 65–99)
Potassium: 3.4 mmol/L — ABNORMAL LOW (ref 3.5–5.1)
Sodium: 131 mmol/L — ABNORMAL LOW (ref 135–145)

## 2015-12-23 MED ORDER — ONDANSETRON 8 MG PO TBDP
8.0000 mg | ORAL_TABLET | Freq: Once | ORAL | Status: AC
  Administered 2015-12-23: 8 mg via ORAL

## 2015-12-23 MED ORDER — ONDANSETRON 8 MG PO TBDP: 8.0000 mg | ORAL_TABLET | Freq: Two times a day (BID) | ORAL | Status: DC

## 2015-12-23 NOTE — ED Provider Notes (Signed)
CSN: [id number]     Arrival date & time 12/23/15  1035 History   None    Chief Complaint  Patient presents with  . Nausea  . Fatigue  . Chest Pain   (Consider location/radiation/quality/duration/timing/severity/associated sxs/prior Treatment) HPI Comments: 80 yo male with a c/o nausea and diarrhea yesterday and elevated blood pressure. States also had mid chest discomfort yesterday but not today. Currently only feeling nausea and denies any current chest pain, shortness of breath, fevers, chills.   The history is provided by the patient.    Past Medical History  Diagnosis Date  . Superficial hematoma   . Prostate cancer ([place]) 2004  . Hypercholesterolemia   . GERD (gastroesophageal reflux disease)   . Arthritis   . Heart disease   . H/O echocardiogram     05/2013  . Hx of colonoscopy     04/22/2013  . Fracture, femur ([place]) 2012  . Polyneuropathy ([place])   . Benign essential tremor    Past Surgical History  Procedure Laterality Date  . Basal cell carcinoma excision    . Hernia repair  1990  . Coronary artery bypass graft  1987    X2  . Colonoscopy  05/01/2009    Positive for colonic polyps  . Prostate surgery  2004   Family History  Problem Relation Age of Onset  . Heart disease Mother    Social History  Substance Use Topics  . Smoking status: Never Smoker   . Smokeless tobacco: Never Used  . Alcohol Use: 0.0 oz/week    0 Standard drinks or equivalent per week     Comment: wine in the evening    Review of Systems  Allergies  Codeine; Indocin; Latex; and Other  Home Medications   Prior to Admission medications   Medication Sig Start Date End Date Taking? Authorizing Provider  Alpha Lipoic Acid 200 MG CAPS Take 2 capsules by mouth daily.   Yes Historical Provider, MD  amLODipine (NORVASC) 2.5 MG tablet Take 1 tablet (2.5 mg total) by mouth daily. 04/20/15  Yes Arlis Porta., MD  aspirin 81 MG tablet Take 81 mg by mouth daily.   Yes Historical Provider,  MD  B Complex Vitamins (VITAMIN-B COMPLEX PO) Take 1 tablet by mouth daily. Reported on 10/06/2015   Yes Historical Provider, MD  Biotin 1000 MCG tablet Take 1,000 mcg by mouth daily.   Yes Historical Provider, MD  Calcium Carb-Cholecalciferol 500-100 MG-UNIT CHEW Chew 500 mg by mouth daily.   Yes Historical Provider, MD  cyanocobalamin 1000 MCG tablet Take 100 mcg by mouth daily.   Yes Historical Provider, MD  folic acid (FOLVITE) 1 MG tablet Take 1 mg by mouth daily.   Yes Historical Provider, MD  furosemide (LASIX) 20 MG tablet Take 1 tablet (20 mg total) by mouth daily. 04/20/15  Yes Arlis Porta., MD  gabapentin (NEURONTIN) 600 MG tablet Take 1 tablet (600 mg total) by mouth 3 (three) times daily. 04/20/15  Yes Arlis Porta., MD  Glucosamine Sulfate 1000 MG CAPS Take 1 capsule by mouth daily.   Yes Historical Provider, MD  hydrochlorothiazide (HYDRODIURIL) 25 MG tablet Take 1/2 tablet orally each AM 09/22/15  Yes Arlis Porta., MD  Lecithin 400 MG CAPS Take 400 mg by mouth daily.   Yes Historical Provider, MD  losartan (COZAAR) 50 MG tablet Take 1 tablet (50 mg total) by mouth daily. 04/20/15  Yes Arlis Porta., MD  Melatonin 3  MG CAPS Take 3 mg by mouth at bedtime.   Yes Historical Provider, MD  mupirocin ointment (BACTROBAN) 2 % Apply 1 application topically 3 (three) times daily. 12/10/15  Yes William P Roemer, PA-C  Omega 3 1000 MG CAPS Take 1,000 mg by mouth daily.   Yes Historical Provider, MD  omeprazole (PRILOSEC) 20 MG capsule Take 1 capsule (20 mg total) by mouth daily. 04/20/15  Yes Arlis Porta., MD  polyethylene glycol powder [facility]) powder 255 grams one bottle for colonoscopy prep 12/17/15  Yes Seeplaputhur Robinette Haines, MD  potassium chloride 20 MEQ TBCR Take 2 tablets each day 09/22/15  Yes Arlis Porta., MD  pravastatin (PRAVACHOL) 80 MG tablet Take 1 tablet (80 mg total) by mouth daily. 04/20/15  Yes Arlis Porta., MD  primidone  (MYSOLINE) 50 MG tablet Take 1 tablet (50 mg total) by mouth 3 (three) times daily. 04/20/15  Yes Arlis Porta., MD  Selenium 200 MCG CAPS Take 200 mcg by mouth daily.   Yes Historical Provider, MD  tretinoin (RETIN-A) 0.05 % cream Apply 1 application topically at bedtime.   Yes Historical Provider, MD  doxycycline (VIBRAMYCIN) 100 MG capsule Take 1 capsule (100 mg total) by mouth 2 (two) times daily. 12/10/15   Lorin Picket, PA-C  ondansetron (ZOFRAN ODT) 8 MG disintegrating tablet Take 1 tablet (8 mg total) by mouth 2 (two) times daily. 12/23/15   Norval Gable, MD   Meds Ordered and Administered this Visit   Medications  ondansetron (ZOFRAN-ODT) disintegrating tablet 8 mg (8 mg Oral Given 12/23/15 1119)    BP 130/95 mmHg  Pulse 83  Temp(Src) 98 F (36.7 C) (Oral)  Resp 18  Ht 6' (1.829 m)  Wt 215 lb (97.523 kg)  BMI 29.15 kg/m2  SpO2 96% No data found.   Physical Exam  Constitutional: He appears well-developed and well-nourished. No distress.  HENT:  Head: Normocephalic and atraumatic.  Right Ear: Tympanic membrane, external ear and ear canal normal.  Left Ear: Tympanic membrane, external ear and ear canal normal.  Nose: Nose normal.  Mouth/Throat: Uvula is midline, oropharynx is clear and moist and mucous membranes are normal. No oropharyngeal exudate or tonsillar abscesses.  Eyes: Conjunctivae and EOM are normal. Pupils are equal, round, and reactive to light. Right eye exhibits no discharge. Left eye exhibits no discharge. No scleral icterus.  Neck: Normal range of motion. Neck supple. No tracheal deviation present. No thyromegaly present.  Cardiovascular: Normal rate, regular rhythm and normal heart sounds.   Pulmonary/Chest: Effort normal and breath sounds normal. No stridor. No respiratory distress. He has no wheezes. He has no rales. He exhibits no tenderness.  Abdominal: Soft. Bowel sounds are normal. He exhibits no distension and no mass. There is tenderness  (mild, epigastric). There is no rebound and no guarding.  Lymphadenopathy:    He has no cervical adenopathy.  Neurological: He is alert.  Skin: Skin is warm and dry. No rash noted. He is not diaphoretic.  Nursing note and vitals reviewed.   ED Course  Procedures (including critical care time)  Labs Review Labs Reviewed  BASIC METABOLIC PANEL - Abnormal; Notable for the following:    Sodium 131 (*)    Potassium 3.4 (*)    Chloride 91 (*)    Glucose, Bld 116 (*)    All other components within normal limits  CBC WITH DIFFERENTIAL/PLATELET - Abnormal; Notable for the following:    RBC 3.85 (*)  HCT 39.2 (*)    MCV 101.8 (*)    MCH 35.3 (*)    All other components within normal limits    Imaging Review No results found.   Visual Acuity Review  Right Eye Distance:   Left Eye Distance:   Bilateral Distance:    Right Eye Near:   Left Eye Near:    Bilateral Near:      EKG: normal EKG, normal sinus rhythm, unchanged from previous tracings; reviewed by me and agree.  MDM   1. Gastroenteritis   2. Hyponatremia   3. Hypokalemia    Discharge Medication List as of 12/23/2015 12:18 PM    START taking these medications   Details  ondansetron (ZOFRAN ODT) 8 MG disintegrating tablet Take 1 tablet (8 mg total) by mouth 2 (two) times daily., Starting 12/23/2015, Until Discontinued, Normal        1. Labs results and diagnosis reviewed with patient; patient given zofran 8mg  odt with improvement of symptoms and was tolerating po fluids prior to D/C 2. rx as per orders above; reviewed possible side effects, interactions, risks and benefits  3. Recommend supportive treatment with increased fluids/clear liquids then advance slowly as tolerated  4. Follow-up prn if symptoms worsen or don't improve    Norval Gable, MD 12/23/15 2059

## 2015-12-23 NOTE — ED Notes (Signed)
Patient started having chest pain and elevated blood pressure yesterday. Patient has become nauseous and with diarrhea. Patient has a history of heart disease.

## 2015-12-23 NOTE — Discharge Instructions (Signed)

## 2015-12-30 DIAGNOSIS — L97521 Non-pressure chronic ulcer of other part of left foot limited to breakdown of skin: Secondary | ICD-10-CM | POA: Diagnosis not present

## 2015-12-31 DIAGNOSIS — I10 Essential (primary) hypertension: Secondary | ICD-10-CM | POA: Diagnosis not present

## 2015-12-31 DIAGNOSIS — I5022 Chronic systolic (congestive) heart failure: Secondary | ICD-10-CM | POA: Diagnosis not present

## 2015-12-31 DIAGNOSIS — I2510 Atherosclerotic heart disease of native coronary artery without angina pectoris: Secondary | ICD-10-CM | POA: Diagnosis not present

## 2016-01-04 ENCOUNTER — Ambulatory Visit
Admission: EM | Admit: 2016-01-04 | Discharge: 2016-01-04 | Disposition: A | Discharge status: Other Acute Inpatient Hospital | Payer: Medicare Other | Attending: Family Medicine | Admitting: Family Medicine

## 2016-01-04 DIAGNOSIS — R918 Other nonspecific abnormal finding of lung field: Secondary | ICD-10-CM | POA: Diagnosis not present

## 2016-01-04 DIAGNOSIS — R200 Anesthesia of skin: Secondary | ICD-10-CM | POA: Diagnosis not present

## 2016-01-04 DIAGNOSIS — Z951 Presence of aortocoronary bypass graft: Secondary | ICD-10-CM | POA: Diagnosis not present

## 2016-01-04 DIAGNOSIS — M1990 Unspecified osteoarthritis, unspecified site: Secondary | ICD-10-CM | POA: Diagnosis not present

## 2016-01-04 DIAGNOSIS — M4854XS Collapsed vertebra, not elsewhere classified, thoracic region, sequela of fracture: Secondary | ICD-10-CM | POA: Diagnosis not present

## 2016-01-04 DIAGNOSIS — R202 Paresthesia of skin: Secondary | ICD-10-CM | POA: Insufficient documentation

## 2016-01-04 DIAGNOSIS — R110 Nausea: Secondary | ICD-10-CM | POA: Diagnosis not present

## 2016-01-04 DIAGNOSIS — I2510 Atherosclerotic heart disease of native coronary artery without angina pectoris: Secondary | ICD-10-CM | POA: Diagnosis not present

## 2016-01-04 DIAGNOSIS — Z8546 Personal history of malignant neoplasm of prostate: Secondary | ICD-10-CM | POA: Insufficient documentation

## 2016-01-04 DIAGNOSIS — E7800 Pure hypercholesterolemia, unspecified: Secondary | ICD-10-CM | POA: Insufficient documentation

## 2016-01-04 DIAGNOSIS — I252 Old myocardial infarction: Secondary | ICD-10-CM | POA: Diagnosis not present

## 2016-01-04 DIAGNOSIS — G250 Essential tremor: Secondary | ICD-10-CM | POA: Insufficient documentation

## 2016-01-04 DIAGNOSIS — E871 Hypo-osmolality and hyponatremia: Secondary | ICD-10-CM | POA: Diagnosis not present

## 2016-01-04 DIAGNOSIS — K219 Gastro-esophageal reflux disease without esophagitis: Secondary | ICD-10-CM | POA: Diagnosis not present

## 2016-01-04 DIAGNOSIS — R911 Solitary pulmonary nodule: Secondary | ICD-10-CM | POA: Diagnosis not present

## 2016-01-04 DIAGNOSIS — M40209 Unspecified kyphosis, site unspecified: Secondary | ICD-10-CM | POA: Diagnosis not present

## 2016-01-04 DIAGNOSIS — J984 Other disorders of lung: Secondary | ICD-10-CM | POA: Diagnosis not present

## 2016-01-04 DIAGNOSIS — Z79899 Other long term (current) drug therapy: Secondary | ICD-10-CM | POA: Diagnosis not present

## 2016-01-04 DIAGNOSIS — Z7982 Long term (current) use of aspirin: Secondary | ICD-10-CM | POA: Insufficient documentation

## 2016-01-04 DIAGNOSIS — M79641 Pain in right hand: Secondary | ICD-10-CM | POA: Diagnosis not present

## 2016-01-04 DIAGNOSIS — M4854XA Collapsed vertebra, not elsewhere classified, thoracic region, initial encounter for fracture: Secondary | ICD-10-CM | POA: Diagnosis not present

## 2016-01-04 MED ORDER — ONDANSETRON 8 MG PO TBDP
8.0000 mg | ORAL_TABLET | Freq: Once | ORAL | Status: AC
  Administered 2016-01-04: 8 mg via ORAL

## 2016-01-04 NOTE — ED Triage Notes (Addendum)
Patient complains of tingling and numbness in both hands, and some nausea since he got up this morning. Feels dizzy at times.  He states he hasn't had any pain in his chest, he just doesn't feel right.

## 2016-01-04 NOTE — ED Notes (Signed)
[facility] contacted for transfer to [facility] ED.

## 2016-01-04 NOTE — ED Provider Notes (Signed)
[facility]    CSN: [id number] Arrival date & time: 01/04/16  1058  First Provider Contact:  First MD Initiated Contact with Patient 01/04/16 1133        History   Chief Complaint Chief Complaint  Patient presents with  . Tingling    Numbness in hands     HPI [name] is a 80 y.o. male.   Patient is a 80 year old white male with a history of peripheral neuropathy nose extremities is been progressive. He states however this morning he had tingling and numbness in both of his hands. Numbness in his fingers and hands are thing started about 7:00 this morning and along with the tingling and numbness of the hands and fingers he's had nausea. He denies any chest pain shortness of breath. States that he doesn't have any shortness of breath because he is not exerting himself. States that the numbness and tingling has persisted and his blood pressure appears to be stable according to his wife but he was concerned so he came to be evaluated. States he caught his PCP Dr. Luan Pulling but could not be seen today they're to decide to come to the urgent care for his neurological problem. Patient is not the best historian and his story is vague and appears to be somewhat anxious about the numbness and  not feeling well today.   The history is provided by the patient. No language interpreter was used.  Weakness  This is a new problem. The current episode started 3 to 5 hours ago. The problem occurs constantly. The problem has not changed since onset.Pertinent negatives include no chest pain, no abdominal pain, no headaches and no shortness of breath. Nothing relieves the symptoms. He has tried nothing for the symptoms. The treatment provided no relief.    Past Medical History:  Diagnosis Date  . Arthritis   . Benign essential tremor   . Fracture, femur ([place]) 2012  . GERD (gastroesophageal reflux disease)   . H/O echocardiogram    05/2013  . Heart disease   . Hx of colonoscopy    04/22/2013  . Hypercholesterolemia   . Polyneuropathy ([place])   . Prostate cancer ([place]) 2004  . Superficial hematoma     Patient Active Problem List   Diagnosis Date Noted  . Anemia 09/22/2015  . Medicare annual wellness visit, initial 04/17/2015  . Prostate cancer ([place]) 04/16/2015  . Hypercholesteremia 04/16/2015  . Idiopathic peripheral neuropathy ([place]) 04/16/2015  . Essential hypertension 04/16/2015  . Heart disease 04/16/2015  . GERD (gastroesophageal reflux disease) 04/16/2015  . Cellulitis of digit 04/16/2015  . Arthritis 04/16/2015  . Pedal edema 04/16/2015    Past Surgical History:  Procedure Laterality Date  . BASAL CELL CARCINOMA EXCISION    . COLONOSCOPY  05/01/2009   Positive for colonic polyps  . CORONARY ARTERY BYPASS GRAFT  1987   X2  . HERNIA REPAIR  1990  . PROSTATE SURGERY  2004       Home Medications    Prior to Admission medications   Medication Sig Start Date End Date Taking? Authorizing Provider  Alpha Lipoic Acid 200 MG CAPS Take 2 capsules by mouth daily.   Yes Historical Provider, MD  amLODipine (NORVASC) 2.5 MG tablet Take 1 tablet (2.5 mg total) by mouth daily. 04/20/15  Yes Arlis Porta., MD  aspirin 81 MG tablet Take 81 mg by mouth daily.   Yes Historical Provider, MD  B Complex Vitamins (VITAMIN-B COMPLEX PO) Take 1  tablet by mouth daily. Reported on 10/06/2015   Yes Historical Provider, MD  Biotin 1000 MCG tablet Take 1,000 mcg by mouth daily.   Yes Historical Provider, MD  Calcium Carb-Cholecalciferol 500-100 MG-UNIT CHEW Chew 500 mg by mouth daily.   Yes Historical Provider, MD  cyanocobalamin 1000 MCG tablet Take 100 mcg by mouth daily.   Yes Historical Provider, MD  folic acid (FOLVITE) 1 MG tablet Take 1 mg by mouth daily.   Yes Historical Provider, MD  furosemide (LASIX) 20 MG tablet Take 1 tablet (20 mg total) by mouth daily. 04/20/15  Yes Arlis Porta., MD  gabapentin (NEURONTIN) 600 MG tablet Take 1 tablet (600 mg total)  by mouth 3 (three) times daily. 04/20/15  Yes Arlis Porta., MD  Glucosamine Sulfate 1000 MG CAPS Take 1 capsule by mouth daily.   Yes Historical Provider, MD  hydrochlorothiazide (HYDRODIURIL) 25 MG tablet Take 1/2 tablet orally each AM 09/22/15  Yes Arlis Porta., MD  Lecithin 400 MG CAPS Take 400 mg by mouth daily.   Yes Historical Provider, MD  losartan (COZAAR) 50 MG tablet Take 1 tablet (50 mg total) by mouth daily. 04/20/15  Yes Arlis Porta., MD  Melatonin 3 MG CAPS Take 3 mg by mouth at bedtime.   Yes Historical Provider, MD  mupirocin ointment (BACTROBAN) 2 % Apply 1 application topically 3 (three) times daily. 12/10/15  Yes William P Roemer, PA-C  Omega 3 1000 MG CAPS Take 1,000 mg by mouth daily.   Yes Historical Provider, MD  omeprazole (PRILOSEC) 20 MG capsule Take 1 capsule by mouth  daily 12/24/15  Yes Arlis Porta., MD  polyethylene glycol powder [facility]) powder 255 grams one bottle for colonoscopy prep 12/17/15  Yes Seeplaputhur Robinette Haines, MD  potassium chloride 20 MEQ TBCR Take 2 tablets each day 09/22/15  Yes Arlis Porta., MD  pravastatin (PRAVACHOL) 80 MG tablet Take 1 tablet (80 mg total) by mouth daily. 04/20/15  Yes Arlis Porta., MD  primidone (MYSOLINE) 50 MG tablet Take 1 tablet (50 mg total) by mouth 3 (three) times daily. 04/20/15  Yes Arlis Porta., MD  Selenium 200 MCG CAPS Take 200 mcg by mouth daily.   Yes Historical Provider, MD  tretinoin (RETIN-A) 0.05 % cream Apply 1 application topically at bedtime.   Yes Historical Provider, MD  doxycycline (VIBRAMYCIN) 100 MG capsule Take 1 capsule (100 mg total) by mouth 2 (two) times daily. 12/10/15   Lorin Picket, PA-C  ondansetron (ZOFRAN ODT) 8 MG disintegrating tablet Take 1 tablet (8 mg total) by mouth 2 (two) times daily. 12/23/15   Norval Gable, MD    Family History Family History  Problem Relation Age of Onset  . Heart disease Mother     Social History Social  History  Substance Use Topics  . Smoking status: Never Smoker  . Smokeless tobacco: Never Used  . Alcohol use 0.0 oz/week     Comment: wine in the evening     Allergies   Codeine; Indocin [indomethacin]; Latex; and Other   Review of Systems Review of Systems  Constitutional: Positive for activity change and appetite change.  Respiratory: Negative for shortness of breath.   Cardiovascular: Negative for chest pain.  Gastrointestinal: Negative for abdominal pain.  Neurological: Positive for weakness and light-headedness. Negative for headaches.  Psychiatric/Behavioral: The patient is nervous/anxious.      Physical Exam Triage Vital Signs ED Triage  Vitals  Enc Vitals Group     BP 01/04/16 1115 135/71     Pulse Rate 01/04/16 1115 82     Resp 01/04/16 1115 20     Temp 01/04/16 1115 98.1 F (36.7 C)     Temp Source 01/04/16 1115 Oral     SpO2 01/04/16 1115 97 %     Weight 01/04/16 1115 215 lb (97.5 kg)     Height 01/04/16 1115 6' (1.829 m)     Head Circumference --      Peak Flow --      Pain Score 01/04/16 1116 0     Pain Loc --      Pain Edu? --      Excl. in [place]? --    No data found.   Updated Vital Signs BP 135/71   Pulse 82   Temp 98.1 F (36.7 C) (Oral)   Resp 20   Ht 6' (1.829 m)   Wt 215 lb (97.5 kg)   SpO2 97%   BMI 29.16 kg/m   Visual Acuity Right Eye Distance:   Left Eye Distance:   Bilateral Distance:    Right Eye Near:   Left Eye Near:    Bilateral Near:     Physical Exam  Constitutional: He is oriented to person, place, and time. He appears well-nourished.  HENT:  Head: Normocephalic and atraumatic.  Hearing aid is present in the right ear  Eyes: Conjunctivae and lids are normal. Pupils are equal, round, and reactive to light.  Neck: Neck supple.  Cardiovascular: Normal heart sounds.     Patient has a scar on the chest was a previous heart surgery.  Pulmonary/Chest: Effort normal and breath sounds normal.  Abdominal: Soft. Normal  appearance and bowel sounds are normal. He exhibits distension. There is no hepatosplenomegaly. There is no tenderness. There is no CVA tenderness, no tenderness at McBurney's point and negative Murphy's sign.  Musculoskeletal: Normal range of motion.  Neurological: He is alert and oriented to person, place, and time. Coordination and gait abnormal.  Patient has some contractions of the upper extremities which apparently is old he's got a brace on both lower extremities to help him with his gait  Skin: Skin is warm and dry.  Psychiatric: His mood appears anxious. Cognition and memory are normal.     UC Treatments / Results  Labs (all labs ordered are listed, but only abnormal results are displayed) Labs Reviewed - No data to display  EKG  EKG Interpretation None       Radiology No results found.  Procedures Procedures (including critical care time)  Medications Ordered in UC Medications  ondansetron (ZOFRAN-ODT) disintegrating tablet 8 mg (8 mg Oral Given 01/04/16 1158)     Initial Impression / Assessment and Plan / UC Course  I have reviewed the triage vital signs and the nursing notes.  Pertinent labs & imaging results that were available during my care of the patient were reviewed by me and considered in my medical decision making (see chart for details).  Clinical Course   ED ECG REPORT I, Bryley Kovacevic H, the attending physician, personally viewed and interpreted this ECG.   Date: 01/04/2016  EKG Time: 11:18:40  Rate: 78  Rhythm: normal EKG, normal sinus rhythm  Axis: 60  Intervals:none  ST&T Change: none   Final Clinical Impressions(s) / UC Diagnoses   Final diagnoses:  Paresthesia of both hands  Numbness and tingling of both upper extremities while sleeping  EKG was normal but will need transfer to a neurological Center for evaluation   Patient was transferred to ED of choice. Unfortunately [facility] is on diversion due to "malfunctioning at the physical  plant. Will transfer to [facility] or [facility]   No medications on file     Frederich Cha, MD 01/04/16 1215

## 2016-01-11 DIAGNOSIS — L97522 Non-pressure chronic ulcer of other part of left foot with fat layer exposed: Secondary | ICD-10-CM | POA: Diagnosis not present

## 2016-01-11 DIAGNOSIS — M89372 Hypertrophy of bone, left ankle and foot: Secondary | ICD-10-CM | POA: Diagnosis not present

## 2016-01-18 DIAGNOSIS — L97522 Non-pressure chronic ulcer of other part of left foot with fat layer exposed: Secondary | ICD-10-CM | POA: Diagnosis not present

## 2016-01-18 DIAGNOSIS — I739 Peripheral vascular disease, unspecified: Secondary | ICD-10-CM | POA: Diagnosis not present

## 2016-01-20 DIAGNOSIS — R110 Nausea: Secondary | ICD-10-CM | POA: Diagnosis not present

## 2016-01-26 DIAGNOSIS — L97522 Non-pressure chronic ulcer of other part of left foot with fat layer exposed: Secondary | ICD-10-CM | POA: Diagnosis not present

## 2016-02-01 DIAGNOSIS — L97522 Non-pressure chronic ulcer of other part of left foot with fat layer exposed: Secondary | ICD-10-CM | POA: Diagnosis not present

## 2016-02-05 DIAGNOSIS — L97522 Non-pressure chronic ulcer of other part of left foot with fat layer exposed: Secondary | ICD-10-CM | POA: Diagnosis not present

## 2016-02-09 ENCOUNTER — Encounter: Payer: Self-pay | Admitting: Family Medicine

## 2016-02-09 ENCOUNTER — Ambulatory Visit (INDEPENDENT_AMBULATORY_CARE_PROVIDER_SITE_OTHER): Payer: Medicare Other | Admitting: Family Medicine

## 2016-02-09 VITALS — BP 141/83 | HR 90 | Temp 98.5°F | Resp 16 | Ht 72.0 in | Wt 207.0 lb

## 2016-02-09 DIAGNOSIS — C61 Malignant neoplasm of prostate: Secondary | ICD-10-CM | POA: Diagnosis not present

## 2016-02-09 DIAGNOSIS — G609 Hereditary and idiopathic neuropathy, unspecified: Secondary | ICD-10-CM

## 2016-02-09 DIAGNOSIS — L97529 Non-pressure chronic ulcer of other part of left foot with unspecified severity: Secondary | ICD-10-CM | POA: Diagnosis not present

## 2016-02-09 DIAGNOSIS — I2510 Atherosclerotic heart disease of native coronary artery without angina pectoris: Secondary | ICD-10-CM

## 2016-02-09 DIAGNOSIS — K219 Gastro-esophageal reflux disease without esophagitis: Secondary | ICD-10-CM

## 2016-02-09 DIAGNOSIS — I10 Essential (primary) hypertension: Secondary | ICD-10-CM | POA: Diagnosis not present

## 2016-02-09 DIAGNOSIS — I2583 Coronary atherosclerosis due to lipid rich plaque: Secondary | ICD-10-CM

## 2016-02-09 DIAGNOSIS — L97509 Non-pressure chronic ulcer of other part of unspecified foot with unspecified severity: Secondary | ICD-10-CM | POA: Insufficient documentation

## 2016-02-09 NOTE — Addendum Note (Signed)
Addended by: Devona Konig on: 02/09/2016 02:43 PM   Modules accepted: Orders

## 2016-02-09 NOTE — Progress Notes (Signed)
Name: [name]   MRN: [id number]    DOB: January 29, 1936   Date:02/09/2016       Progress Note  Subjective  Chief Complaint  Chief Complaint  Patient presents with  . potassium    HPI Here for f/u of HBP.  Has idiopathic peripheral neuropathy.  Has had problems with a non-healing ulcer on L great toe hammer toe.  Still not healed  Over the past 2-3 months.  He has had episodes of gastritis with nausea.  This is doing better now.    No problem-specific Assessment & Plan notes found for this encounter.   Past Medical History:  Diagnosis Date  . Arthritis   . Benign essential tremor   . Fracture, femur ([place]) 2012  . GERD (gastroesophageal reflux disease)   . H/O echocardiogram    05/2013  . Heart disease   . Hx of colonoscopy    04/22/2013  . Hypercholesterolemia   . Polyneuropathy ([place])   . Prostate cancer ([place]) 2004  . Superficial hematoma     Past Surgical History:  Procedure Laterality Date  . BASAL CELL CARCINOMA EXCISION    . COLONOSCOPY  05/01/2009   Positive for colonic polyps  . CORONARY ARTERY BYPASS GRAFT  1987   X2  . HERNIA REPAIR  1990  . PROSTATE SURGERY  2004    Family History  Problem Relation Age of Onset  . Heart disease Mother     Social History   Social History  . Marital status: Married    Spouse name: N/A  . Number of children: N/A  . Years of education: N/A   Occupational History  . Not on file.   Social History Main Topics  . Smoking status: Never Smoker  . Smokeless tobacco: Never Used  . Alcohol use 0.0 oz/week     Comment: wine in the evening  . Drug use: No  . Sexual activity: Not on file   Other Topics Concern  . Not on file   Social History Narrative  . No narrative on file     Current Outpatient Prescriptions:  .  Alpha Lipoic Acid 200 MG CAPS, Take 2 capsules by mouth daily., Disp: , Rfl:  .  amLODipine (NORVASC) 2.5 MG tablet, Take 1 tablet (2.5 mg total) by mouth daily., Disp: 90 tablet, Rfl: 2 .  aspirin 81 MG  tablet, Take 81 mg by mouth daily., Disp: , Rfl:  .  B Complex Vitamins (VITAMIN-B COMPLEX PO), Take 1 tablet by mouth daily. Reported on 10/06/2015, Disp: , Rfl:  .  Biotin 1000 MCG tablet, Take 1,000 mcg by mouth daily., Disp: , Rfl:  .  Calcium Carb-Cholecalciferol 500-100 MG-UNIT CHEW, Chew 500 mg by mouth daily., Disp: , Rfl:  .  cyanocobalamin 1000 MCG tablet, Take 100 mcg by mouth daily., Disp: , Rfl:  .  folic acid (FOLVITE) 1 MG tablet, Take 1 mg by mouth daily., Disp: , Rfl:  .  furosemide (LASIX) 20 MG tablet, Take 1 tablet (20 mg total) by mouth daily., Disp: 90 tablet, Rfl: 2 .  gabapentin (NEURONTIN) 600 MG tablet, Take 1 tablet (600 mg total) by mouth 3 (three) times daily. (Patient taking differently: Take 600 mg by mouth 2 (two) times daily. ), Disp: 360 tablet, Rfl: 3 .  Glucosamine Sulfate 1000 MG CAPS, Take 1 capsule by mouth daily., Disp: , Rfl:  .  hydrochlorothiazide (HYDRODIURIL) 25 MG tablet, Take 1/2 tablet orally each AM, Disp: 90 tablet, Rfl: 2 .  Lecithin 400 MG CAPS, Take 400 mg by mouth daily., Disp: , Rfl:  .  losartan (COZAAR) 50 MG tablet, Take 1 tablet (50 mg total) by mouth daily., Disp: 90 tablet, Rfl: 2 .  Melatonin 3 MG CAPS, Take 3 mg by mouth at bedtime., Disp: , Rfl:  .  mupirocin cream (BACTROBAN) 2 %, Apply 1 application topically 3 (three) times daily., Disp: , Rfl:  .  Omega 3 1000 MG CAPS, Take 1,000 mg by mouth daily., Disp: , Rfl:  .  omeprazole (PRILOSEC) 20 MG capsule, Take 1 capsule by mouth  daily (Patient taking differently: Take 2capsule by mouth  daily), Disp: 90 capsule, Rfl: 3 .  ondansetron (ZOFRAN ODT) 8 MG disintegrating tablet, Take 1 tablet (8 mg total) by mouth 2 (two) times daily., Disp: 6 tablet, Rfl: 0 .  polyethylene glycol powder (GLYCOLAX/MIRALAX) powder, 255 grams one bottle for colonoscopy prep, Disp: 255 g, Rfl: 0 .  potassium chloride 20 MEQ TBCR, Take 2 tablets each day, Disp: 180 tablet, Rfl: 3 .  pravastatin (PRAVACHOL) 80  MG tablet, Take 1 tablet (80 mg total) by mouth daily., Disp: 90 tablet, Rfl: 2 .  primidone (MYSOLINE) 50 MG tablet, Take 1 tablet (50 mg total) by mouth 3 (three) times daily., Disp: 360 tablet, Rfl: 2 .  Selenium 200 MCG CAPS, Take 200 mcg by mouth daily., Disp: , Rfl:  .  tretinoin (RETIN-A) 0.05 % cream, Apply 1 application topically at bedtime., Disp: , Rfl:   Allergies  Allergen Reactions  . Codeine Other (See Comments)  . Indocin [Indomethacin] Other (See Comments)  . Latex Other (See Comments)  . Other Nausea And Vomiting    mussels     Review of Systems  Constitutional: Positive for malaise/fatigue and weight loss. Negative for chills and fever.  HENT: Negative for hearing loss.   Eyes: Negative for blurred vision and double vision.  Respiratory: Negative for cough, shortness of breath and wheezing.   Cardiovascular: Negative for chest pain, palpitations and leg swelling.  Gastrointestinal: Negative for abdominal pain, blood in stool and heartburn.  Genitourinary: Negative for dysuria, frequency and urgency.  Musculoskeletal: Negative for myalgias.  Skin: Negative for rash.  Neurological: Positive for weakness. Negative for headaches.       Peripheral neuropathy      Objective  Vitals:   02/09/16 1324  BP: (!) 141/83  Pulse: 90  Resp: 16  Temp: 98.5 F (36.9 C)  TempSrc: Oral  Weight: 207 lb (93.9 kg)  Height: 6' (1.829 m)    Physical Exam  Constitutional: He is oriented to person, place, and time and well-developed, well-nourished, and in no distress. No distress.  HENT:  Head: Normocephalic and atraumatic.  Neck: Normal range of motion. Neck supple. Carotid bruit is not present. No tracheal deviation present. No thyromegaly present.  Cardiovascular: Normal rate, regular rhythm and normal heart sounds.  Exam reveals no gallop and no friction rub.   No murmur heard. Pulmonary/Chest: Effort normal and breath sounds normal. No respiratory distress. He has  no wheezes. He has no rales.  Musculoskeletal: He exhibits no edema.  Lymphadenopathy:    He has no cervical adenopathy.  Neurological: He is alert and oriented to person, place, and time.  Skin:  Ulcers x 2 of L great toe.  Vitals reviewed.      Recent Results (from the past 2160 hour(s))  Basic metabolic panel     Status: Abnormal   Collection Time: 12/23/15 11:17 AM  Result Value Ref Range   Sodium 131 (L) 135 - 145 mmol/L   Potassium 3.4 (L) 3.5 - 5.1 mmol/L   Chloride 91 (L) 101 - 111 mmol/L   CO2 28 22 - 32 mmol/L   Glucose, Bld 116 (H) 65 - 99 mg/dL   BUN 10 6 - 20 mg/dL   Creatinine, Ser 0.80 0.61 - 1.24 mg/dL   Calcium 9.7 8.9 - 10.3 mg/dL   GFR calc non Af Amer >60 >60 mL/min   GFR calc Af Amer >60 >60 mL/min    Comment: (NOTE) The eGFR has been calculated using the CKD EPI equation. This calculation has not been validated in all clinical situations. eGFR's persistently <60 mL/min signify possible Chronic Kidney Disease.    Anion gap 12 5 - 15  CBC with Differential     Status: Abnormal   Collection Time: 12/23/15 11:17 AM  Result Value Ref Range   WBC 5.9 3.8 - 10.6 K/uL   RBC 3.85 (L) 4.40 - 5.90 MIL/uL   Hemoglobin 13.6 13.0 - 18.0 g/dL   HCT 39.2 (L) 40.0 - 52.0 %   MCV 101.8 (H) 80.0 - 100.0 fL   MCH 35.3 (H) 26.0 - 34.0 pg   MCHC 34.7 32.0 - 36.0 g/dL   RDW 12.9 11.5 - 14.5 %   Platelets 243 150 - 440 K/uL   Neutrophils Relative % 72 %   Neutro Abs 4.2 1.4 - 6.5 K/uL   Lymphocytes Relative 17 %   Lymphs Abs 1.0 1.0 - 3.6 K/uL   Monocytes Relative 10 %   Monocytes Absolute 0.6 0.2 - 1.0 K/uL   Eosinophils Relative 1 %   Eosinophils Absolute 0.1 0 - 0.7 K/uL   Basophils Relative 0 %   Basophils Absolute 0.0 0 - 0.1 K/uL     Assessment & Plan  Problem List Items Addressed This Visit      Cardiovascular and Mediastinum   Essential hypertension - Primary   Relevant Orders   COMPLETE METABOLIC PANEL WITH GFR   CAD (coronary artery disease)    Relevant Orders   Lipid Profile     Digestive   GERD (gastroesophageal reflux disease)   Relevant Orders   CBC with Differential     Nervous and Auditory   Idiopathic peripheral neuropathy (HCC)     Genitourinary   Prostate cancer (HCC)     Other   Non-healing ulcer of foot ([place])   Relevant Orders   MR Foot Left W Wo Contrast    Other Visit Diagnoses   None.     Meds ordered this encounter  Medications  . DISCONTD: mometasone (NASONEX) 50 MCG/ACT nasal spray    Sig: Place 1 spray into the nose daily.  . mupirocin cream (BACTROBAN) 2 %    Sig: Apply 1 application topically 3 (three) times daily.   1. Essential hypertension Cont meds - COMPLETE METABOLIC PANEL WITH GFR  2. Coronary artery disease due to lipid rich plaque Cont meds - Lipid Profile  3. Gastroesophageal reflux disease without esophagitis Cont meds - CBC with Differential  4. Idiopathic peripheral neuropathy (HCC) Cont meds  5. Prostate cancer ([place])   6. Non-healing ulcer of foot, left, with unspecified severity ([place]) Cont medicated dressings. - MR Foot Left W Wo Contrast; Future

## 2016-02-11 DIAGNOSIS — L97521 Non-pressure chronic ulcer of other part of left foot limited to breakdown of skin: Secondary | ICD-10-CM | POA: Diagnosis not present

## 2016-02-11 DIAGNOSIS — M2042 Other hammer toe(s) (acquired), left foot: Secondary | ICD-10-CM | POA: Diagnosis not present

## 2016-02-12 ENCOUNTER — Other Ambulatory Visit: Payer: Medicare Other

## 2016-02-12 DIAGNOSIS — K219 Gastro-esophageal reflux disease without esophagitis: Secondary | ICD-10-CM | POA: Diagnosis not present

## 2016-02-12 DIAGNOSIS — I10 Essential (primary) hypertension: Secondary | ICD-10-CM | POA: Diagnosis not present

## 2016-02-12 DIAGNOSIS — I2510 Atherosclerotic heart disease of native coronary artery without angina pectoris: Secondary | ICD-10-CM | POA: Diagnosis not present

## 2016-02-12 LAB — CBC WITH DIFFERENTIAL/PLATELET
BASOS ABS: 35 {cells}/uL (ref 0–200)
BASOS PCT: 1 %
EOS ABS: 140 {cells}/uL (ref 15–500)
Eosinophils Relative: 4 %
HCT: 37.6 % — ABNORMAL LOW (ref 38.5–50.0)
HEMOGLOBIN: 12.8 g/dL — AB (ref 13.2–17.1)
LYMPHS ABS: 1400 {cells}/uL (ref 850–3900)
Lymphocytes Relative: 40 %
MCH: 34.5 pg — AB (ref 27.0–33.0)
MCHC: 34 g/dL (ref 32.0–36.0)
MCV: 101.3 fL — AB (ref 80.0–100.0)
MONO ABS: 350 {cells}/uL (ref 200–950)
MPV: 9.4 fL (ref 7.5–12.5)
Monocytes Relative: 10 %
NEUTROS ABS: 1575 {cells}/uL (ref 1500–7800)
Neutrophils Relative %: 45 %
Platelets: 241 10*3/uL (ref 140–400)
RBC: 3.71 MIL/uL — ABNORMAL LOW (ref 4.20–5.80)
RDW: 13.6 % (ref 11.0–15.0)
WBC: 3.5 10*3/uL — ABNORMAL LOW (ref 3.8–10.8)

## 2016-02-13 LAB — COMPLETE METABOLIC PANEL WITH GFR
ALT: 36 U/L (ref 9–46)
AST: 33 U/L (ref 10–35)
Albumin: 4.2 g/dL (ref 3.6–5.1)
Alkaline Phosphatase: 65 U/L (ref 40–115)
BUN: 12 mg/dL (ref 7–25)
CHLORIDE: 94 mmol/L — AB (ref 98–110)
CO2: 29 mmol/L (ref 20–31)
Calcium: 9 mg/dL (ref 8.6–10.3)
Creat: 0.83 mg/dL (ref 0.70–1.18)
GFR, EST NON AFRICAN AMERICAN: 84 mL/min (ref >=60)
GFR, Est African American: >89 mL/min (ref >=60)
GLUCOSE: 93 mg/dL (ref 65–99)
POTASSIUM: 3.7 mmol/L (ref 3.5–5.3)
SODIUM: 135 mmol/L (ref 135–146)
TOTAL PROTEIN: 6.6 g/dL (ref 6.1–8.1)
Total Bilirubin: 0.5 mg/dL (ref 0.2–1.2)

## 2016-02-13 LAB — LIPID PANEL
CHOL/HDL RATIO: 2.2 ratio (ref <=5.0)
Cholesterol: 161 mg/dL (ref 125–200)
HDL: 72 mg/dL (ref >=40)
LDL Cholesterol: 38 mg/dL (ref <130)
Triglycerides: 257 mg/dL — ABNORMAL HIGH (ref <150)
VLDL: 51 mg/dL — AB (ref <30)

## 2016-02-17 ENCOUNTER — Ambulatory Visit
Admission: RE | Admit: 2016-02-17 | Discharge: 2016-02-17 | Disposition: A | Discharge status: Home / Self Care | Payer: Medicare Other | Source: Ambulatory Visit | Attending: Family Medicine | Admitting: Family Medicine

## 2016-02-17 DIAGNOSIS — M85872 Other specified disorders of bone density and structure, left ankle and foot: Secondary | ICD-10-CM | POA: Diagnosis not present

## 2016-02-17 DIAGNOSIS — L97529 Non-pressure chronic ulcer of other part of left foot with unspecified severity: Secondary | ICD-10-CM

## 2016-02-17 DIAGNOSIS — M12872 Other specific arthropathies, not elsewhere classified, left ankle and foot: Secondary | ICD-10-CM | POA: Insufficient documentation

## 2016-02-17 DIAGNOSIS — G609 Hereditary and idiopathic neuropathy, unspecified: Secondary | ICD-10-CM

## 2016-02-17 DIAGNOSIS — L97429 Non-pressure chronic ulcer of left heel and midfoot with unspecified severity: Secondary | ICD-10-CM | POA: Diagnosis not present

## 2016-02-24 ENCOUNTER — Encounter
Admission: RE | Disposition: A | Discharge status: Home / Self Care | Payer: Self-pay | Source: Ambulatory Visit | Attending: General Surgery

## 2016-02-24 ENCOUNTER — Ambulatory Visit: Payer: Medicare Other | Admitting: Anesthesiology

## 2016-02-24 ENCOUNTER — Encounter: Payer: Self-pay | Admitting: *Deleted

## 2016-02-24 ENCOUNTER — Ambulatory Visit
Admission: RE | Admit: 2016-02-24 | Discharge: 2016-02-24 | Disposition: A | Discharge status: Home / Self Care | Payer: Medicare Other | Source: Ambulatory Visit | Attending: General Surgery | Admitting: General Surgery

## 2016-02-24 DIAGNOSIS — Z85828 Personal history of other malignant neoplasm of skin: Secondary | ICD-10-CM | POA: Diagnosis not present

## 2016-02-24 DIAGNOSIS — Z86010 Personal history of colonic polyps: Secondary | ICD-10-CM | POA: Diagnosis not present

## 2016-02-24 DIAGNOSIS — K5730 Diverticulosis of large intestine without perforation or abscess without bleeding: Secondary | ICD-10-CM | POA: Diagnosis not present

## 2016-02-24 DIAGNOSIS — K219 Gastro-esophageal reflux disease without esophagitis: Secondary | ICD-10-CM | POA: Insufficient documentation

## 2016-02-24 DIAGNOSIS — I509 Heart failure, unspecified: Secondary | ICD-10-CM | POA: Diagnosis not present

## 2016-02-24 DIAGNOSIS — Z8546 Personal history of malignant neoplasm of prostate: Secondary | ICD-10-CM | POA: Diagnosis not present

## 2016-02-24 DIAGNOSIS — I110 Hypertensive heart disease with heart failure: Secondary | ICD-10-CM | POA: Diagnosis not present

## 2016-02-24 DIAGNOSIS — Z1211 Encounter for screening for malignant neoplasm of colon: Secondary | ICD-10-CM | POA: Diagnosis not present

## 2016-02-24 DIAGNOSIS — G629 Polyneuropathy, unspecified: Secondary | ICD-10-CM | POA: Insufficient documentation

## 2016-02-24 DIAGNOSIS — I2510 Atherosclerotic heart disease of native coronary artery without angina pectoris: Secondary | ICD-10-CM | POA: Diagnosis not present

## 2016-02-24 DIAGNOSIS — E7800 Pure hypercholesterolemia, unspecified: Secondary | ICD-10-CM | POA: Insufficient documentation

## 2016-02-24 DIAGNOSIS — G250 Essential tremor: Secondary | ICD-10-CM | POA: Diagnosis not present

## 2016-02-24 DIAGNOSIS — Z91040 Latex allergy status: Secondary | ICD-10-CM | POA: Diagnosis not present

## 2016-02-24 DIAGNOSIS — I252 Old myocardial infarction: Secondary | ICD-10-CM | POA: Insufficient documentation

## 2016-02-24 DIAGNOSIS — K644 Residual hemorrhoidal skin tags: Secondary | ICD-10-CM | POA: Insufficient documentation

## 2016-02-24 DIAGNOSIS — Z7982 Long term (current) use of aspirin: Secondary | ICD-10-CM | POA: Insufficient documentation

## 2016-02-24 DIAGNOSIS — M1990 Unspecified osteoarthritis, unspecified site: Secondary | ICD-10-CM | POA: Insufficient documentation

## 2016-02-24 DIAGNOSIS — Z8249 Family history of ischemic heart disease and other diseases of the circulatory system: Secondary | ICD-10-CM | POA: Insufficient documentation

## 2016-02-24 DIAGNOSIS — D649 Anemia, unspecified: Secondary | ICD-10-CM | POA: Insufficient documentation

## 2016-02-24 DIAGNOSIS — K5790 Diverticulosis of intestine, part unspecified, without perforation or abscess without bleeding: Secondary | ICD-10-CM | POA: Diagnosis not present

## 2016-02-24 HISTORY — DX: Essential (primary) hypertension: I10

## 2016-02-24 HISTORY — DX: Acute myocardial infarction, unspecified: I21.9

## 2016-02-24 HISTORY — PX: COLONOSCOPY WITH PROPOFOL: SHX5780

## 2016-02-24 SURGERY — COLONOSCOPY WITH PROPOFOL
Anesthesia: General

## 2016-02-24 MED ORDER — SODIUM CHLORIDE 0.9 % IV SOLN
INTRAVENOUS | Status: DC
  Administered 2016-02-24 (×2): via INTRAVENOUS

## 2016-02-24 MED ORDER — EPHEDRINE SULFATE 50 MG/ML IJ SOLN
INTRAMUSCULAR | Status: DC | PRN
  Administered 2016-02-24 (×2): 10 mg via INTRAVENOUS

## 2016-02-24 MED ORDER — MIDAZOLAM HCL 2 MG/2ML IJ SOLN
INTRAMUSCULAR | Status: DC | PRN
  Administered 2016-02-24: 1 mg via INTRAVENOUS

## 2016-02-24 MED ORDER — PROPOFOL 500 MG/50ML IV EMUL
INTRAVENOUS | Status: DC | PRN
  Administered 2016-02-24: 100 ug/kg/min via INTRAVENOUS

## 2016-02-24 MED ORDER — FENTANYL CITRATE (PF) 100 MCG/2ML IJ SOLN
INTRAMUSCULAR | Status: DC | PRN
  Administered 2016-02-24: 50 ug via INTRAVENOUS

## 2016-02-24 NOTE — Anesthesia Postprocedure Evaluation (Signed)
Anesthesia Post Note  Patient: [name]  Procedure(s) Performed: Procedure(s) (LRB): COLONOSCOPY WITH PROPOFOL (N/A)  Anesthesia Type: General Level of consciousness: awake Pain management: satisfactory to patient Vital Signs Assessment: post-procedure vital signs reviewed and stable Respiratory status: nonlabored ventilation Cardiovascular status: stable Anesthetic complications: no    Last Vitals:  Vitals:   02/24/16 0840 02/24/16 0844  BP: 95/65 95/65  Pulse: 76 76  Resp: 12 12  Temp: 37 C 36.8 C    Last Pain:  Vitals:   02/24/16 0840  TempSrc: Tympanic                 VAN STAVEREN,Stanislaus Kaltenbach

## 2016-02-24 NOTE — Interval H&P Note (Signed)
History and Physical Interval Note:  02/24/2016 8:06 AM  [name]  has presented today for surgery, with the diagnosis of HX COLON POLYPS  The various methods of treatment have been discussed with the patient and family. After consideration of risks, benefits and other options for treatment, the patient has consented to  Procedure(s): COLONOSCOPY WITH PROPOFOL (N/A) as a surgical intervention .  The patient's history has been reviewed, patient examined, no change in status, stable for surgery.  I have reviewed the patient's chart and labs.  Questions were answered to the patient's satisfaction.     Yanette Tripoli G

## 2016-02-24 NOTE — Op Note (Signed)
[facility] Patient Name: [name] Procedure Date: 02/24/2016 8:02 AM MRN: [id number] Account #: [id number] Date of Birth: 10/26/1935 Admit Type: Outpatient Age: 80 Room: [facility] ENDO ROOM 1 Gender: Male Note Status: Finalized Procedure:            Colonoscopy Indications:          High risk colon cancer surveillance: Personal history                        of colonic polyps Providers:            Del Wiseman G. Jamal Collin, MD Referring MD:         Arlis Porta, MD (Referring MD) Medicines:            General Anesthesia Complications:        No immediate complications. Procedure:            Pre-Anesthesia Assessment:                       - General anesthesia under the supervision of an                        anesthesiologist was determined to be medically                        necessary for this procedure based on review of the                        patient's medical history, medications, and prior                        anesthesia history.                       After obtaining informed consent, the colonoscope was                        passed under direct vision. Throughout the procedure,                        the patient's blood pressure, pulse, and oxygen                        saturations were monitored continuously. The                        Colonoscope was introduced through the anus and                        advanced to the the cecum, identified by the ileocecal                        valve. The colonoscopy was performed without                        difficulty. The patient tolerated the procedure well.                        The quality of the bowel preparation was good. Findings:      The perianal exam findings include non-thrombosed external hemorrhoids.      Multiple  small and large-mouthed diverticula were found in the sigmoid       colon.      The exam was otherwise without abnormality on direct and retroflexion   views. Impression:           - Non-thrombosed external hemorrhoids found on perianal                        exam.                       - Diverticulosis in the sigmoid colon.                       - The examination was otherwise normal on direct and                        retroflexion views.                       - No specimens collected. Recommendation:       - Discharge patient to home.                       - Resume previous diet.                       - Return to primary care physician as previously                        scheduled. Procedure Code(s):    --- Professional ---                       [phone], Colonoscopy, flexible; diagnostic, including                        collection of specimen(s) by brushing or washing, when                        performed (separate procedure) Diagnosis Code(s):    --- Professional ---                       Z86.010, Personal history of colonic polyps                       K64.4, Residual hemorrhoidal skin tags                       K57.30, Diverticulosis of large intestine without                        perforation or abscess without bleeding CPT copyright 2016 [employer]. All rights reserved. The codes documented in this report are preliminary and upon coder review may  be revised to meet current compliance requirements. Christene Lye, MD 02/24/2016 8:42:45 AM This report has been signed electronically. Number of Addenda: 0 Note Initiated On: 02/24/2016 8:02 AM Scope Withdrawal Time: 0 hours 4 minutes 58 seconds  Total Procedure Duration: 0 hours 29 minutes 38 seconds       [facility]

## 2016-02-24 NOTE — Transfer of Care (Signed)
Immediate Anesthesia Transfer of Care Note  Patient: [name]  Procedure(s) Performed: Procedure(s): COLONOSCOPY WITH PROPOFOL (N/A)  Patient Location: PACU  Anesthesia Type:General  Level of Consciousness: awake and alert   Airway & Oxygen Therapy: Patient Spontanous Breathing  Post-op Assessment: Report given to RN  Post vital signs: Reviewed and stable  Last Vitals:  Vitals:   02/24/16 0840 02/24/16 0844  BP: 95/65 95/65  Pulse: 76 76  Resp: 12 12  Temp: 37 C 36.8 C    Last Pain:  Vitals:   02/24/16 0840  TempSrc: Tympanic         Complications: No apparent anesthesia complications

## 2016-02-24 NOTE — H&P (Signed)
[name] is an 80 y.o. male.   Chief Complaint: here for colonoscopy. HPI: 80 yr old male with  history of colon polyps. Needs surveillance colonoscopy. No GI complaints.  Past Medical History:  Diagnosis Date  . Arthritis   . Benign essential tremor   . Fracture, femur ([place]) 2012  . GERD (gastroesophageal reflux disease)   . H/O echocardiogram    05/2013  . Heart disease   . Hx of colonoscopy    04/22/2013  . Hypercholesterolemia   . Hypertension   . Myocardial infarction ([place])    1987  . Polyneuropathy ([place])   . Prostate cancer ([place]) 2004  . Superficial hematoma     Past Surgical History:  Procedure Laterality Date  . BASAL CELL CARCINOMA EXCISION    . COLONOSCOPY  05/01/2009   Positive for colonic polyps  . CORONARY ARTERY BYPASS GRAFT  1987   X2  . HERNIA REPAIR  1990  . PROSTATE SURGERY  2004    Family History  Problem Relation Age of Onset  . Heart disease Mother    Social History:  reports that he has never smoked. He has never used smokeless tobacco. He reports that he drinks alcohol. He reports that he does not use drugs.  Allergies:  Allergies  Allergen Reactions  . Codeine Other (See Comments)  . Indocin [Indomethacin] Other (See Comments)  . Latex Other (See Comments)  . Other Nausea And Vomiting    mussels    Medications Prior to Admission  Medication Sig Dispense Refill  . Alpha Lipoic Acid 200 MG CAPS Take 2 capsules by mouth daily.    Marland Kitchen amLODipine (NORVASC) 2.5 MG tablet Take 1 tablet (2.5 mg total) by mouth daily. 90 tablet 2  . aspirin 81 MG tablet Take 81 mg by mouth daily.    . B Complex Vitamins (VITAMIN-B COMPLEX PO) Take 1 tablet by mouth daily. Reported on 10/06/2015    . Biotin 1000 MCG tablet Take 1,000 mcg by mouth daily.    . Calcium Carb-Cholecalciferol 500-100 MG-UNIT CHEW Chew 500 mg by mouth daily.    . cyanocobalamin 1000 MCG tablet Take 100 mcg by mouth daily.    . folic acid (FOLVITE) 1 MG tablet Take 1 mg by mouth daily.     . furosemide (LASIX) 20 MG tablet Take 1 tablet (20 mg total) by mouth daily. 90 tablet 2  . gabapentin (NEURONTIN) 600 MG tablet Take 1 tablet (600 mg total) by mouth 3 (three) times daily. (Patient taking differently: Take 600 mg by mouth 2 (two) times daily. ) 360 tablet 3  . Glucosamine Sulfate 1000 MG CAPS Take 1 capsule by mouth daily.    . hydrochlorothiazide (HYDRODIURIL) 25 MG tablet Take 1/2 tablet orally each AM 90 tablet 2  . Lecithin 400 MG CAPS Take 400 mg by mouth daily.    Marland Kitchen losartan (COZAAR) 50 MG tablet Take 1 tablet (50 mg total) by mouth daily. 90 tablet 2  . Melatonin 3 MG CAPS Take 3 mg by mouth at bedtime.    . mupirocin cream (BACTROBAN) 2 % Apply 1 application topically 3 (three) times daily.    . Omega 3 1000 MG CAPS Take 1,000 mg by mouth daily.    Marland Kitchen omeprazole (PRILOSEC) 20 MG capsule Take 1 capsule by mouth  daily (Patient taking differently: Take 2capsule by mouth  daily) 90 capsule 3  . potassium chloride 20 MEQ TBCR Take 2 tablets each day 180 tablet 3  .  pravastatin (PRAVACHOL) 80 MG tablet Take 1 tablet (80 mg total) by mouth daily. 90 tablet 2  . primidone (MYSOLINE) 50 MG tablet Take 1 tablet (50 mg total) by mouth 3 (three) times daily. 360 tablet 2  . Selenium 200 MCG CAPS Take 200 mcg by mouth daily.    Marland Kitchen tretinoin (RETIN-A) 0.05 % cream Apply 1 application topically at bedtime.    . ondansetron (ZOFRAN ODT) 8 MG disintegrating tablet Take 1 tablet (8 mg total) by mouth 2 (two) times daily. 6 tablet 0  . polyethylene glycol powder (GLYCOLAX/MIRALAX) powder 255 grams one bottle for colonoscopy prep 255 g 0    No results found for this or any previous visit (from the past 63 hour(s)). No results found.  Review of Systems  Constitutional: Negative.   Respiratory: Negative.   Cardiovascular: Negative.   Genitourinary: Negative.     Blood pressure 133/72, pulse 93, temperature 98.5 F (36.9 C), temperature source Tympanic, resp. rate 16, height 6'  (1.829 m), weight 207 lb (93.9 kg), SpO2 96 %. Physical Exam  Constitutional: He is oriented to person, place, and time. He appears well-developed and well-nourished.  Eyes: Conjunctivae are normal. No scleral icterus.  Cardiovascular: Normal rate, regular rhythm and normal heart sounds.   Respiratory: Effort normal and breath sounds normal.  GI: Soft. Bowel sounds are normal. He exhibits no distension and no mass. There is no tenderness.  Neurological: He is alert and oriented to person, place, and time.  Skin: Skin is warm and dry.     Assessment/Plan OK to proceed with planned colonoscopy  Christene Lye, MD 02/24/2016, 8:03 AM

## 2016-02-24 NOTE — Anesthesia Preprocedure Evaluation (Signed)
Anesthesia Evaluation  Patient identified by MRN, date of birth, ID band Patient awake    Reviewed: Allergy & Precautions, NPO status , Patient's Chart, lab work & pertinent test results  Airway Mallampati: II       Dental  (+) Teeth Intact   Pulmonary neg pulmonary ROS,    breath sounds clear to auscultation       Cardiovascular Exercise Tolerance: Poor hypertension, Pt. on medications and Pt. on home beta blockers + CAD, + Past MI and +CHF   Rhythm:Regular Rate:Normal     Neuro/Psych  Neuromuscular disease    GI/Hepatic Neg liver ROS, GERD  Medicated,  Endo/Other  negative endocrine ROS  Renal/GU negative Renal ROS     Musculoskeletal   Abdominal Normal abdominal exam  (+)   Peds negative pediatric ROS (+)  Hematology  (+) anemia ,   Anesthesia Other Findings   Reproductive/Obstetrics                             Anesthesia Physical Anesthesia Plan  ASA: III  Anesthesia Plan: General   Post-op Pain Management:    Induction: Intravenous  Airway Management Planned: Natural Airway and Nasal Cannula  Additional Equipment:   Intra-op Plan:   Post-operative Plan:   Informed Consent: I have reviewed the patients History and Physical, chart, labs and discussed the procedure including the risks, benefits and alternatives for the proposed anesthesia with the patient or authorized representative who has indicated his/her understanding and acceptance.     Plan Discussed with: CRNA  Anesthesia Plan Comments:         Anesthesia Quick Evaluation

## 2016-02-25 ENCOUNTER — Encounter: Payer: Self-pay | Admitting: General Surgery

## 2016-03-01 ENCOUNTER — Ambulatory Visit (INDEPENDENT_AMBULATORY_CARE_PROVIDER_SITE_OTHER): Payer: Medicare Other | Admitting: Family Medicine

## 2016-03-01 ENCOUNTER — Encounter: Payer: Self-pay | Admitting: Family Medicine

## 2016-03-01 VITALS — BP 144/81 | HR 87 | Temp 98.3°F | Resp 16 | Wt 212.6 lb

## 2016-03-01 DIAGNOSIS — L97521 Non-pressure chronic ulcer of other part of left foot limited to breakdown of skin: Secondary | ICD-10-CM

## 2016-03-01 DIAGNOSIS — I2510 Atherosclerotic heart disease of native coronary artery without angina pectoris: Secondary | ICD-10-CM | POA: Diagnosis not present

## 2016-03-01 DIAGNOSIS — M20021 Boutonniere deformity of right finger(s): Secondary | ICD-10-CM

## 2016-03-01 DIAGNOSIS — T148 Other injury of unspecified body region: Secondary | ICD-10-CM | POA: Diagnosis not present

## 2016-03-01 DIAGNOSIS — T148XXA Other injury of unspecified body region, initial encounter: Secondary | ICD-10-CM

## 2016-03-01 DIAGNOSIS — G609 Hereditary and idiopathic neuropathy, unspecified: Secondary | ICD-10-CM | POA: Diagnosis not present

## 2016-03-01 NOTE — Assessment & Plan Note (Signed)
Chronic problem, bilateral distal extremities, associated with some joint deformity. Suspect main reason for decreased sensation in Right finger with Boutonniere deformity and Left great toe with skin abrasion.

## 2016-03-01 NOTE — Assessment & Plan Note (Signed)
Stable, without any evidence of infection, continues to use rx debridement treatment per Podiatry Follow-up as scheduled 1 week Dr Etheleen Mayhew Podiatry

## 2016-03-01 NOTE — Progress Notes (Signed)
Subjective:    Patient ID: [name], male    DOB: Dec 11, 1935, 80 y.o.   MRN: [id number]  [name] is a 80 y.o. male presenting on 03/01/2016 for Follow-up (Pain in pinky finger in left hand, as well as big toe on left foot.)   HPI   Right 5th Finger Deformity: - Reports new onset problem within 1 week, with Right 5th finger deformity unable to straighten. He woke up with this deformity without any trauma or injury, he does initially describe bruising on dorsal aspect of mid finger over DIP joint that has since resolved over past few days. He denied any pain with this problem. It does not affect his group or daily function. He is not interested in surgery, but wanted to get some advice.  Chronic Idiopathic Neuropathy, bilateral extremities - Reports chronic problem with reduced sensation in both hands and feet, he has been followed for chronically  Left Great Toe Skin Abrasion / Chronic Ulceration / Flexion Contracture - Followed by [facility] (Dr Vickki Muff), recently saw 02/11/16 for chronic Left great toe non-healing ulcer using rx Becaplermin topical for ulceration, previously this was present for months without resolution from topical antibiotic. - Today presents for new onset skin abrasion to lateral aspect of Left Great Toe, thinks he got a skin tear when removing adhesive tape recently, concerned with small amount of bleeding, wanted to get it checked out and has not put any topical medicine yet - Chronic foot drop, wears bilateral lower ext support bracers - Next apt with Podiatry in 1 week  Social History  Substance Use Topics  . Smoking status: Never Smoker  . Smokeless tobacco: Never Used  . Alcohol use 0.0 oz/week     Comment: wine in the evening    Review of Systems Per HPI unless specifically indicated above     Objective:    BP (!) 144/81 (BP Location: Left Arm, Patient Position: Sitting, Cuff Size: Normal)   Pulse 87   Temp 98.3 F (36.8 C) (Oral)   Resp 16    Wt 212 lb 9.6 oz (96.4 kg)   BMI 28.83 kg/m   Wt Readings from Last 3 Encounters:  03/01/16 212 lb 9.6 oz (96.4 kg)  02/24/16 207 lb (93.9 kg)  02/09/16 207 lb (93.9 kg)    Physical Exam  Constitutional: He appears well-developed and well-nourished. No distress.  Elderly 80 yr male, currently well appearing, comfortable, cooperative. Wearing bilateral lower extremity supports for chronic foot drop.  Musculoskeletal:  Left Great Toe - Stable, chronic flexion contracture - Top dorsal aspect toe with small < 3 mm healing chronic ulceration without open wound or erythema, no drainage, very small fibrotic tissue seems to be slowly healing - Left lateral side of Great Toe with two very small skin tear / abrasions, superficial with small amount of local bleeding, no erythema or drainage of pus, non-tender - Chronic onychomycosis  Right Fifth Digit - Boutonniere deformity with flexion contracture of PIP joint with some evidence of fibrotic palmar tendon, resolved ecchymosis on dorsal aspect, no erythema, no edema, non-tender (chronic neuropathy), unable to actively or passively straighten DIP joint - Intact grip strength 5/5 bilaterally  Neurological: He is alert.  Distal sensation to light touch reduced in bilateral hands and feet, chronic unchanged  Skin: Skin is warm and dry. No rash noted. He is not diaphoretic. No erythema.  Nursing note and vitals reviewed.  Right 5th Digit deformity     Left Great Toe, with  bandage peeled back.       Assessment & Plan:   Problem List Items Addressed This Visit    Non-healing ulcer of foot ([place])    Stable, without any evidence of infection, continues to use rx debridement treatment per Podiatry Follow-up as scheduled 1 week Dr [facility]      Idiopathic peripheral neuropathy [facility])    Chronic problem, bilateral distal extremities, associated with some joint deformity. Suspect main reason for decreased sensation in Right finger with  Boutonniere deformity and Left great toe with skin abrasion.      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 Unclear exact etiology unlikely traumatic extensor tendon tear given unable to passively straighten, seems more likely sclerosis of palmar tendon sheath, other joints similar but not has severe deformity. Concern possible RA with bilateral upper/lower deformities.  Plan: 1. Reassurance, currently not affecting his quality of life 2. Recommend buddy taping / support to try to keep some mobility and extension in the joint 3. Follow-up if worsening or other concern, and can refer to Ortho for surgical evaluation       Other Visit Diagnoses    Skin abrasion       Left lateral great toe, suspected superficial skin tear due to adhesive tape. No evidence of erythema or infection. Use topical vaseline / antibiotic ointment. May use ACE wrap to avoid adhesive. F/u with Podiatry as scheduled      No orders of the defined types were placed in this encounter.     Follow up plan: Return in about 3 months (around 05/31/2016), or if symptoms worsen or fail to improve, for right finger contracture.  [name], [facility] 03/01/2016, 3:11 PM

## 2016-03-01 NOTE — Assessment & Plan Note (Signed)
Acute new problem within past 1 week, Right 5th digit, atraumatic DIP flexion contracture consistent of Boutonniere deformity, initially with some ecchymosis since resolved, however no pain due to peripheral neuropathy. Unclear exact etiology unlikely traumatic extensor tendon tear given unable to passively straighten, seems more likely sclerosis of palmar tendon sheath, other joints similar but not has severe deformity. Concern possible RA with bilateral upper/lower deformities.  Plan: 1. Reassurance, currently not affecting his quality of life 2. Recommend buddy taping / support to try to keep some mobility and extension in the joint 3. Follow-up if worsening or other concern, and can refer to Ortho for surgical evaluation

## 2016-03-01 NOTE — Patient Instructions (Signed)
Thank you for coming in to clinic today.  1. For your Right Little Finger - We call this a "Boutonniere Deformity" it can be due to a ruptured tendon otherwise it can be due to hardening of the tendon on inside of finger pulling it in tight. - At this time, if it is not bothering you we do not need to refer to Orthopedics for evaluation, they would discuss surgical repair options, if you are interested, let us know - Otherwise, may try supporting it with buddy tape or trying to encourage it to straighten out to avoid worsening problem  2. For your Left big toe - You have a small skin tear or abrasion, likely from the adhesive with the tape - It is not infected and appears to be small and not deep. It will heal on its own, it may take some time due to some poor circulation - You can start using the antibiotic ointment twice a day, and vaseline as needed to keep it protected for up to 2 weeks as it heals - You may continue the medicine on the ulcer on top of your toe - Use ACE bandage or a wrap at the drug store instead of adhesive tape  Follow-up with your Podiatrist as scheduled next week  Please schedule a follow-up appointment with Dr. Luan Pulling or Dr Parks Ranger as needed for Right Finger contracture anytime in next 1 to 3 months as needed  If you have any other questions or concerns, please feel free to call the clinic or send a message through [place]. You may also schedule an earlier appointment if necessary.  [name], DO [place]

## 2016-03-09 DIAGNOSIS — M79674 Pain in right toe(s): Secondary | ICD-10-CM | POA: Diagnosis not present

## 2016-03-09 DIAGNOSIS — B351 Tinea unguium: Secondary | ICD-10-CM | POA: Diagnosis not present

## 2016-03-09 DIAGNOSIS — L97521 Non-pressure chronic ulcer of other part of left foot limited to breakdown of skin: Secondary | ICD-10-CM | POA: Diagnosis not present

## 2016-03-09 DIAGNOSIS — G609 Hereditary and idiopathic neuropathy, unspecified: Secondary | ICD-10-CM | POA: Diagnosis not present

## 2016-03-09 DIAGNOSIS — M79675 Pain in left toe(s): Secondary | ICD-10-CM | POA: Diagnosis not present

## 2016-03-10 DIAGNOSIS — Z23 Encounter for immunization: Secondary | ICD-10-CM | POA: Diagnosis not present

## 2016-03-14 ENCOUNTER — Encounter: Payer: Self-pay | Admitting: General Surgery

## 2016-03-15 DIAGNOSIS — G250 Essential tremor: Secondary | ICD-10-CM | POA: Diagnosis not present

## 2016-03-15 DIAGNOSIS — R200 Anesthesia of skin: Secondary | ICD-10-CM | POA: Diagnosis not present

## 2016-03-15 DIAGNOSIS — G603 Idiopathic progressive neuropathy: Secondary | ICD-10-CM | POA: Diagnosis not present

## 2016-03-18 ENCOUNTER — Encounter: Payer: Self-pay | Admitting: Family Medicine

## 2016-03-21 DIAGNOSIS — H2513 Age-related nuclear cataract, bilateral: Secondary | ICD-10-CM | POA: Diagnosis not present

## 2016-03-23 DIAGNOSIS — L97521 Non-pressure chronic ulcer of other part of left foot limited to breakdown of skin: Secondary | ICD-10-CM | POA: Diagnosis not present

## 2016-03-23 DIAGNOSIS — B351 Tinea unguium: Secondary | ICD-10-CM | POA: Diagnosis not present

## 2016-03-23 DIAGNOSIS — G609 Hereditary and idiopathic neuropathy, unspecified: Secondary | ICD-10-CM | POA: Diagnosis not present

## 2016-04-04 ENCOUNTER — Ambulatory Visit (INDEPENDENT_AMBULATORY_CARE_PROVIDER_SITE_OTHER): Payer: Medicare Other | Admitting: Family Medicine

## 2016-04-04 ENCOUNTER — Encounter: Payer: Self-pay | Admitting: Family Medicine

## 2016-04-04 VITALS — BP 135/70 | HR 83 | Temp 98.2°F | Resp 16 | Ht 72.0 in | Wt 206.0 lb

## 2016-04-04 DIAGNOSIS — I2510 Atherosclerotic heart disease of native coronary artery without angina pectoris: Secondary | ICD-10-CM

## 2016-04-04 DIAGNOSIS — E782 Mixed hyperlipidemia: Secondary | ICD-10-CM

## 2016-04-04 DIAGNOSIS — I2583 Coronary atherosclerosis due to lipid rich plaque: Secondary | ICD-10-CM

## 2016-04-04 DIAGNOSIS — G609 Hereditary and idiopathic neuropathy, unspecified: Secondary | ICD-10-CM | POA: Diagnosis not present

## 2016-04-04 DIAGNOSIS — I10 Essential (primary) hypertension: Secondary | ICD-10-CM | POA: Diagnosis not present

## 2016-04-04 DIAGNOSIS — K219 Gastro-esophageal reflux disease without esophagitis: Secondary | ICD-10-CM

## 2016-04-04 DIAGNOSIS — D649 Anemia, unspecified: Secondary | ICD-10-CM

## 2016-04-04 NOTE — Progress Notes (Signed)
Name: [name]   MRN: [id number]    DOB: Mar 16, 1936   Date:04/04/2016       Progress Note  Subjective  Chief Complaint  Chief Complaint  Patient presents with  . Hypertension    HPI Here for f/u of toe ulcer.  Ulcer is getting smaller.  Seeing Dr. Elvina Mattes ([place]) about this.    No problem-specific Assessment & Plan notes found for this encounter.   Past Medical History:  Diagnosis Date  . Arthritis   . Benign essential tremor   . Fracture, femur ([place]) 2012  . GERD (gastroesophageal reflux disease)   . H/O echocardiogram    05/2013  . Heart disease   . Hx of colonoscopy    04/22/2013  . Hypercholesterolemia   . Hypertension   . Myocardial infarction    1987  . Polyneuropathy ([place])   . Prostate cancer ([place]) 2004  . Superficial hematoma     Past Surgical History:  Procedure Laterality Date  . BASAL CELL CARCINOMA EXCISION    . COLONOSCOPY  05/01/2009   Positive for colonic polyps  . COLONOSCOPY WITH PROPOFOL N/A 02/24/2016   Procedure: COLONOSCOPY WITH PROPOFOL;  Surgeon: Christene Lye, MD;  Location: [facility] ENDOSCOPY;  Service: Endoscopy;  Laterality: N/A;  . CORONARY ARTERY BYPASS GRAFT  1987   X2  . HERNIA REPAIR  1990  . PROSTATE SURGERY  2004    Family History  Problem Relation Age of Onset  . Heart disease Mother     Social History   Social History  . Marital status: Married    Spouse name: N/A  . Number of children: N/A  . Years of education: N/A   Occupational History  . Not on file.   Social History Main Topics  . Smoking status: Never Smoker  . Smokeless tobacco: Never Used  . Alcohol use 0.0 oz/week     Comment: wine in the evening  . Drug use: No  . Sexual activity: Not on file   Other Topics Concern  . Not on file   Social History Narrative  . No narrative on file     Current Outpatient Prescriptions:  .  Alpha Lipoic Acid 200 MG CAPS, Take 2 capsules by mouth daily., Disp: , Rfl:  .  amLODipine (NORVASC) 2.5 MG  tablet, Take 1 tablet (2.5 mg total) by mouth daily., Disp: 90 tablet, Rfl: 2 .  aspirin 81 MG tablet, Take 81 mg by mouth daily., Disp: , Rfl:  .  B Complex Vitamins (VITAMIN-B COMPLEX PO), Take 1 tablet by mouth daily. Reported on 10/06/2015, Disp: , Rfl:  .  Biotin 1000 MCG tablet, Take 1,000 mcg by mouth daily., Disp: , Rfl:  .  Calcium Carb-Cholecalciferol 500-100 MG-UNIT CHEW, Chew 500 mg by mouth daily., Disp: , Rfl:  .  cyanocobalamin 1000 MCG tablet, Take 100 mcg by mouth daily., Disp: , Rfl:  .  folic acid (FOLVITE) 1 MG tablet, Take 1 mg by mouth daily., Disp: , Rfl:  .  furosemide (LASIX) 20 MG tablet, Take 1 tablet (20 mg total) by mouth daily., Disp: 90 tablet, Rfl: 2 .  gabapentin (NEURONTIN) 600 MG tablet, Take 1 tablet (600 mg total) by mouth 3 (three) times daily. (Patient taking differently: Take 600 mg by mouth 2 (two) times daily. ), Disp: 360 tablet, Rfl: 3 .  Glucosamine Sulfate 1000 MG CAPS, Take 1 capsule by mouth daily., Disp: , Rfl:  .  hydrochlorothiazide (HYDRODIURIL) 25 MG tablet, Take 1/2 tablet  orally each AM, Disp: 90 tablet, Rfl: 2 .  Lecithin 400 MG CAPS, Take 400 mg by mouth daily., Disp: , Rfl:  .  losartan (COZAAR) 50 MG tablet, Take 1 tablet (50 mg total) by mouth daily., Disp: 90 tablet, Rfl: 2 .  Melatonin 3 MG CAPS, Take 3 mg by mouth at bedtime., Disp: , Rfl:  .  mupirocin cream (BACTROBAN) 2 %, Apply 1 application topically 3 (three) times daily., Disp: , Rfl:  .  Omega 3 1000 MG CAPS, Take 1,000 mg by mouth daily., Disp: , Rfl:  .  omeprazole (PRILOSEC) 20 MG capsule, Take 1 capsule by mouth  daily (Patient taking differently: Take 2capsule by mouth  daily), Disp: 90 capsule, Rfl: 3 .  ondansetron (ZOFRAN ODT) 8 MG disintegrating tablet, Take 1 tablet (8 mg total) by mouth 2 (two) times daily., Disp: 6 tablet, Rfl: 0 .  polyethylene glycol powder (GLYCOLAX/MIRALAX) powder, 255 grams one bottle for colonoscopy prep, Disp: 255 g, Rfl: 0 .  potassium  chloride 20 MEQ TBCR, Take 2 tablets each day, Disp: 180 tablet, Rfl: 3 .  pravastatin (PRAVACHOL) 80 MG tablet, Take 1 tablet (80 mg total) by mouth daily., Disp: 90 tablet, Rfl: 2 .  primidone (MYSOLINE) 50 MG tablet, Take 1 tablet (50 mg total) by mouth 3 (three) times daily., Disp: 360 tablet, Rfl: 2 .  Selenium 200 MCG CAPS, Take 200 mcg by mouth daily., Disp: , Rfl:  .  tretinoin (RETIN-A) 0.05 % cream, Apply 1 application topically at bedtime., Disp: , Rfl:   Allergies  Allergen Reactions  . Codeine Other (See Comments)  . Indocin [Indomethacin] Other (See Comments)  . Latex Other (See Comments)  . Other Nausea And Vomiting    mussels     Review of Systems  Constitutional: Negative for chills, fever, malaise/fatigue and weight loss.  HENT: Negative for hearing loss.   Eyes: Negative for blurred vision and double vision.  Respiratory: Negative for cough, shortness of breath and wheezing.   Cardiovascular: Negative for chest pain, palpitations and leg swelling.  Gastrointestinal: Negative for abdominal pain, blood in stool and heartburn.  Genitourinary: Negative for dysuria, frequency and urgency.  Skin: Negative for rash.  Neurological: Negative for dizziness, tremors, weakness and headaches.      Objective  Vitals:   04/04/16 1351 04/04/16 1436  BP: (!) 147/79 135/70  Pulse: 83   Resp: 16   Temp: 98.2 F (36.8 C)   TempSrc: Oral   Weight: 206 lb (93.4 kg)   Height: 6' (1.829 m)     Physical Exam  Constitutional: He is oriented to person, place, and time and well-developed, well-nourished, and in no distress. No distress.  HENT:  Head: Normocephalic and atraumatic.  Eyes: Conjunctivae and EOM are normal. Pupils are equal, round, and reactive to light. No scleral icterus.  Neck: Normal range of motion. Neck supple. Carotid bruit is not present. No thyromegaly present.  Cardiovascular: Normal rate and regular rhythm.  Exam reveals no gallop and no friction rub.    Murmur heard.  Systolic murmur is present with a grade of 2/6  throughout  Pulmonary/Chest: Effort normal and breath sounds normal. No respiratory distress. He has no wheezes. He has no rales.  Musculoskeletal: He exhibits no edema.  Lymphadenopathy:    He has no cervical adenopathy.  Neurological: He is alert and oriented to person, place, and time.  Vitals reviewed.      Recent Results (from the past 2160 hour(s))  COMPLETE METABOLIC PANEL WITH GFR     Status: Abnormal   Collection Time: 02/12/16  8:57 AM  Result Value Ref Range   Sodium 135 135 - 146 mmol/L   Potassium 3.7 3.5 - 5.3 mmol/L   Chloride 94 (L) 98 - 110 mmol/L   CO2 29 20 - 31 mmol/L   Glucose, Bld 93 65 - 99 mg/dL   BUN 12 7 - 25 mg/dL   Creat 0.83 0.70 - 1.18 mg/dL    Comment:   For patients > or = 78 years of age: The upper reference limit for Creatinine is approximately 13% higher for people identified as African-American.      Total Bilirubin 0.5 0.2 - 1.2 mg/dL   Alkaline Phosphatase 65 40 - 115 U/L   AST 33 10 - 35 U/L   ALT 36 9 - 46 U/L   Total Protein 6.6 6.1 - 8.1 g/dL   Albumin 4.2 3.6 - 5.1 g/dL   Calcium 9.0 8.6 - 10.3 mg/dL   GFR, Est African American >89 >=60 mL/min   GFR, Est Non African American 84 >=60 mL/min  CBC with Differential     Status: Abnormal   Collection Time: 02/12/16  8:57 AM  Result Value Ref Range   WBC 3.5 (L) 3.8 - 10.8 K/uL   RBC 3.71 (L) 4.20 - 5.80 MIL/uL   Hemoglobin 12.8 (L) 13.2 - 17.1 g/dL   HCT 37.6 (L) 38.5 - 50.0 %   MCV 101.3 (H) 80.0 - 100.0 fL   MCH 34.5 (H) 27.0 - 33.0 pg   MCHC 34.0 32.0 - 36.0 g/dL   RDW 13.6 11.0 - 15.0 %   Platelets 241 140 - 400 K/uL   MPV 9.4 7.5 - 12.5 fL   Neutro Abs 1,575 1,500 - 7,800 cells/uL   Lymphs Abs 1,400 850 - 3,900 cells/uL   Monocytes Absolute 350 200 - 950 cells/uL   Eosinophils Absolute 140 15 - 500 cells/uL   Basophils Absolute 35 0 - 200 cells/uL   Neutrophils Relative % 45 %   Lymphocytes Relative 40  %   Monocytes Relative 10 %   Eosinophils Relative 4 %   Basophils Relative 1 %   Smear Review Criteria for review not met   Lipid Profile     Status: Abnormal   Collection Time: 02/12/16  8:57 AM  Result Value Ref Range   Cholesterol 161 125 - 200 mg/dL   Triglycerides 257 (H) <150 mg/dL   HDL 72 >=40 mg/dL   Total CHOL/HDL Ratio 2.2 <=5.0 Ratio   VLDL 51 (H) <30 mg/dL   LDL Cholesterol 38 <130 mg/dL    Comment:   Total Cholesterol/HDL Ratio:CHD Risk                        Coronary Heart Disease Risk Table                                        Men       Women          1/2 Average Risk              3.4        3.3              Average Risk  5.0        4.4           2X Average Risk              9.6        7.1           3X Average Risk             23.4       11.0 Use the calculated Patient Ratio above and the CHD Risk table  to determine the patient's CHD Risk.      Assessment & Plan  Problem List Items Addressed This Visit      Cardiovascular and Mediastinum   Essential hypertension   CAD (coronary artery disease)     Digestive   GERD (gastroesophageal reflux disease)     Nervous and Auditory   Idiopathic peripheral neuropathy     Other   Anemia - Primary   Hyperlipidemia, mixed    Other Visit Diagnoses   None.     No orders of the defined types were placed in this encounter. 1. Anemia, unspecified type   2. Hyperlipidemia, mixed   3. Idiopathic peripheral neuropathy   4. Gastroesophageal reflux disease without esophagitis   5. Coronary artery disease involving native coronary artery of native heart without angina pectoris   6. Essential hypertension

## 2016-04-11 DIAGNOSIS — L97521 Non-pressure chronic ulcer of other part of left foot limited to breakdown of skin: Secondary | ICD-10-CM | POA: Diagnosis not present

## 2016-04-11 DIAGNOSIS — G609 Hereditary and idiopathic neuropathy, unspecified: Secondary | ICD-10-CM | POA: Diagnosis not present

## 2016-04-12 DIAGNOSIS — D485 Neoplasm of uncertain behavior of skin: Secondary | ICD-10-CM | POA: Diagnosis not present

## 2016-04-12 DIAGNOSIS — C44519 Basal cell carcinoma of skin of other part of trunk: Secondary | ICD-10-CM | POA: Diagnosis not present

## 2016-04-12 DIAGNOSIS — D692 Other nonthrombocytopenic purpura: Secondary | ICD-10-CM | POA: Diagnosis not present

## 2016-04-12 DIAGNOSIS — Z85828 Personal history of other malignant neoplasm of skin: Secondary | ICD-10-CM | POA: Diagnosis not present

## 2016-04-12 DIAGNOSIS — L821 Other seborrheic keratosis: Secondary | ICD-10-CM | POA: Diagnosis not present

## 2016-04-12 DIAGNOSIS — L570 Actinic keratosis: Secondary | ICD-10-CM | POA: Diagnosis not present

## 2016-04-27 DIAGNOSIS — L97822 Non-pressure chronic ulcer of other part of left lower leg with fat layer exposed: Secondary | ICD-10-CM | POA: Diagnosis not present

## 2016-04-27 DIAGNOSIS — R2689 Other abnormalities of gait and mobility: Secondary | ICD-10-CM | POA: Diagnosis not present

## 2016-05-11 DIAGNOSIS — R2689 Other abnormalities of gait and mobility: Secondary | ICD-10-CM | POA: Diagnosis not present

## 2016-05-11 DIAGNOSIS — L97822 Non-pressure chronic ulcer of other part of left lower leg with fat layer exposed: Secondary | ICD-10-CM | POA: Diagnosis not present

## 2016-05-19 DIAGNOSIS — I10 Essential (primary) hypertension: Secondary | ICD-10-CM | POA: Diagnosis not present

## 2016-05-19 DIAGNOSIS — R05 Cough: Secondary | ICD-10-CM | POA: Diagnosis not present

## 2016-05-19 DIAGNOSIS — R0602 Shortness of breath: Secondary | ICD-10-CM | POA: Diagnosis not present

## 2016-05-19 DIAGNOSIS — I2510 Atherosclerotic heart disease of native coronary artery without angina pectoris: Secondary | ICD-10-CM | POA: Diagnosis not present

## 2016-05-19 DIAGNOSIS — Z789 Other specified health status: Secondary | ICD-10-CM | POA: Diagnosis not present

## 2016-05-19 DIAGNOSIS — E7800 Pure hypercholesterolemia, unspecified: Secondary | ICD-10-CM | POA: Diagnosis not present

## 2016-05-19 DIAGNOSIS — Z7982 Long term (current) use of aspirin: Secondary | ICD-10-CM | POA: Diagnosis not present

## 2016-05-19 DIAGNOSIS — R042 Hemoptysis: Secondary | ICD-10-CM | POA: Diagnosis not present

## 2016-05-25 DIAGNOSIS — L97822 Non-pressure chronic ulcer of other part of left lower leg with fat layer exposed: Secondary | ICD-10-CM | POA: Diagnosis not present

## 2016-05-25 DIAGNOSIS — R2689 Other abnormalities of gait and mobility: Secondary | ICD-10-CM | POA: Diagnosis not present

## 2016-05-30 DIAGNOSIS — G609 Hereditary and idiopathic neuropathy, unspecified: Secondary | ICD-10-CM | POA: Diagnosis not present

## 2016-05-30 DIAGNOSIS — T148XXA Other injury of unspecified body region, initial encounter: Secondary | ICD-10-CM | POA: Diagnosis not present

## 2016-05-30 DIAGNOSIS — M21371 Foot drop, right foot: Secondary | ICD-10-CM | POA: Diagnosis not present

## 2016-06-15 DIAGNOSIS — R2689 Other abnormalities of gait and mobility: Secondary | ICD-10-CM | POA: Diagnosis not present

## 2016-06-15 DIAGNOSIS — L97822 Non-pressure chronic ulcer of other part of left lower leg with fat layer exposed: Secondary | ICD-10-CM | POA: Diagnosis not present

## 2016-06-16 ENCOUNTER — Telehealth: Payer: Self-pay

## 2016-06-16 ENCOUNTER — Other Ambulatory Visit: Payer: Self-pay | Admitting: Family Medicine

## 2016-06-16 NOTE — Telephone Encounter (Signed)
Patient is requesting a refill on Gabapentin 600mg .  Patient states he is taking 2 caps 2 times a day.  He would like this sent to [facility] mail order.

## 2016-06-17 ENCOUNTER — Other Ambulatory Visit: Payer: Self-pay | Admitting: Family Medicine

## 2016-07-06 DIAGNOSIS — R2242 Localized swelling, mass and lump, left lower limb: Secondary | ICD-10-CM | POA: Diagnosis not present

## 2016-07-06 DIAGNOSIS — L97822 Non-pressure chronic ulcer of other part of left lower leg with fat layer exposed: Secondary | ICD-10-CM | POA: Diagnosis not present

## 2016-07-06 DIAGNOSIS — R2689 Other abnormalities of gait and mobility: Secondary | ICD-10-CM | POA: Diagnosis not present

## 2016-07-08 DIAGNOSIS — L02612 Cutaneous abscess of left foot: Secondary | ICD-10-CM | POA: Diagnosis not present

## 2016-07-20 DIAGNOSIS — R2242 Localized swelling, mass and lump, left lower limb: Secondary | ICD-10-CM | POA: Diagnosis not present

## 2016-07-20 DIAGNOSIS — L97822 Non-pressure chronic ulcer of other part of left lower leg with fat layer exposed: Secondary | ICD-10-CM | POA: Diagnosis not present

## 2016-07-20 DIAGNOSIS — R2689 Other abnormalities of gait and mobility: Secondary | ICD-10-CM | POA: Diagnosis not present

## 2016-07-27 DIAGNOSIS — R2242 Localized swelling, mass and lump, left lower limb: Secondary | ICD-10-CM | POA: Diagnosis not present

## 2016-08-03 DIAGNOSIS — R2689 Other abnormalities of gait and mobility: Secondary | ICD-10-CM | POA: Diagnosis not present

## 2016-08-03 DIAGNOSIS — M86172 Other acute osteomyelitis, left ankle and foot: Secondary | ICD-10-CM | POA: Diagnosis not present

## 2016-08-03 DIAGNOSIS — R2242 Localized swelling, mass and lump, left lower limb: Secondary | ICD-10-CM | POA: Diagnosis not present

## 2016-08-03 DIAGNOSIS — L97822 Non-pressure chronic ulcer of other part of left lower leg with fat layer exposed: Secondary | ICD-10-CM | POA: Diagnosis not present

## 2016-08-16 DIAGNOSIS — M868X7 Other osteomyelitis, ankle and foot: Secondary | ICD-10-CM | POA: Diagnosis not present

## 2016-08-17 DIAGNOSIS — R2242 Localized swelling, mass and lump, left lower limb: Secondary | ICD-10-CM | POA: Diagnosis not present

## 2016-08-17 DIAGNOSIS — L97822 Non-pressure chronic ulcer of other part of left lower leg with fat layer exposed: Secondary | ICD-10-CM | POA: Diagnosis not present

## 2016-08-17 DIAGNOSIS — R2689 Other abnormalities of gait and mobility: Secondary | ICD-10-CM | POA: Diagnosis not present

## 2016-08-17 DIAGNOSIS — M86172 Other acute osteomyelitis, left ankle and foot: Secondary | ICD-10-CM | POA: Diagnosis not present

## 2016-08-25 DIAGNOSIS — M869 Osteomyelitis, unspecified: Secondary | ICD-10-CM | POA: Diagnosis not present

## 2016-08-25 DIAGNOSIS — Z23 Encounter for immunization: Secondary | ICD-10-CM | POA: Diagnosis not present

## 2016-08-25 DIAGNOSIS — Z1211 Encounter for screening for malignant neoplasm of colon: Secondary | ICD-10-CM | POA: Diagnosis not present

## 2016-08-25 DIAGNOSIS — R110 Nausea: Secondary | ICD-10-CM | POA: Diagnosis not present

## 2016-08-29 ENCOUNTER — Telehealth: Payer: Self-pay | Admitting: Family Medicine

## 2016-08-29 MED ORDER — GABAPENTIN 600 MG PO TABS: 1200.0000 mg | ORAL_TABLET | Freq: Two times a day (BID) | ORAL | 1 refills | Status: DC

## 2016-08-29 NOTE — Telephone Encounter (Signed)
Pt. Called states that he was taken 2 gabapentin twice a day  , but new  Prescription is  Stating that he ned to take  1 tablet 3 x a  Day  Pt  Call back  # is  [phone]

## 2016-08-30 DIAGNOSIS — M868X7 Other osteomyelitis, ankle and foot: Secondary | ICD-10-CM | POA: Diagnosis not present

## 2016-08-31 DIAGNOSIS — M86172 Other acute osteomyelitis, left ankle and foot: Secondary | ICD-10-CM | POA: Diagnosis not present

## 2016-08-31 DIAGNOSIS — R2689 Other abnormalities of gait and mobility: Secondary | ICD-10-CM | POA: Diagnosis not present

## 2016-08-31 DIAGNOSIS — R2242 Localized swelling, mass and lump, left lower limb: Secondary | ICD-10-CM | POA: Diagnosis not present

## 2016-08-31 DIAGNOSIS — L97822 Non-pressure chronic ulcer of other part of left lower leg with fat layer exposed: Secondary | ICD-10-CM | POA: Diagnosis not present

## 2016-09-06 DIAGNOSIS — M868X7 Other osteomyelitis, ankle and foot: Secondary | ICD-10-CM | POA: Diagnosis not present

## 2016-09-06 DIAGNOSIS — M86672 Other chronic osteomyelitis, left ankle and foot: Secondary | ICD-10-CM | POA: Diagnosis not present

## 2016-09-07 DIAGNOSIS — M86172 Other acute osteomyelitis, left ankle and foot: Secondary | ICD-10-CM | POA: Diagnosis not present

## 2016-09-07 DIAGNOSIS — R2689 Other abnormalities of gait and mobility: Secondary | ICD-10-CM | POA: Diagnosis not present

## 2016-09-07 DIAGNOSIS — L97822 Non-pressure chronic ulcer of other part of left lower leg with fat layer exposed: Secondary | ICD-10-CM | POA: Diagnosis not present

## 2016-09-07 DIAGNOSIS — R2242 Localized swelling, mass and lump, left lower limb: Secondary | ICD-10-CM | POA: Diagnosis not present

## 2016-09-14 DIAGNOSIS — M86672 Other chronic osteomyelitis, left ankle and foot: Secondary | ICD-10-CM | POA: Diagnosis not present

## 2016-09-19 DIAGNOSIS — L97522 Non-pressure chronic ulcer of other part of left foot with fat layer exposed: Secondary | ICD-10-CM | POA: Diagnosis not present

## 2016-09-19 DIAGNOSIS — G609 Hereditary and idiopathic neuropathy, unspecified: Secondary | ICD-10-CM | POA: Diagnosis not present

## 2016-09-19 DIAGNOSIS — M2042 Other hammer toe(s) (acquired), left foot: Secondary | ICD-10-CM | POA: Diagnosis not present

## 2016-09-22 DIAGNOSIS — I10 Essential (primary) hypertension: Secondary | ICD-10-CM | POA: Diagnosis not present

## 2016-09-22 DIAGNOSIS — I2510 Atherosclerotic heart disease of native coronary artery without angina pectoris: Secondary | ICD-10-CM | POA: Diagnosis not present

## 2016-09-22 DIAGNOSIS — E782 Mixed hyperlipidemia: Secondary | ICD-10-CM | POA: Diagnosis not present

## 2016-09-23 ENCOUNTER — Other Ambulatory Visit: Payer: Self-pay | Admitting: Family Medicine

## 2016-09-23 ENCOUNTER — Encounter: Payer: Self-pay | Admitting: Family Medicine

## 2016-09-23 ENCOUNTER — Ambulatory Visit (INDEPENDENT_AMBULATORY_CARE_PROVIDER_SITE_OTHER): Payer: Medicare Other | Admitting: Family Medicine

## 2016-09-23 VITALS — BP 148/80 | HR 77 | Temp 98.2°F | Resp 16 | Ht 72.0 in | Wt 207.0 lb

## 2016-09-23 DIAGNOSIS — K219 Gastro-esophageal reflux disease without esophagitis: Secondary | ICD-10-CM

## 2016-09-23 DIAGNOSIS — L97521 Non-pressure chronic ulcer of other part of left foot limited to breakdown of skin: Secondary | ICD-10-CM | POA: Diagnosis not present

## 2016-09-23 DIAGNOSIS — I10 Essential (primary) hypertension: Secondary | ICD-10-CM

## 2016-09-23 DIAGNOSIS — G609 Hereditary and idiopathic neuropathy, unspecified: Secondary | ICD-10-CM

## 2016-09-23 DIAGNOSIS — G250 Essential tremor: Secondary | ICD-10-CM

## 2016-09-23 DIAGNOSIS — E782 Mixed hyperlipidemia: Secondary | ICD-10-CM

## 2016-09-23 MED ORDER — AMLODIPINE BESYLATE 2.5 MG PO TABS: 2.5000 mg | ORAL_TABLET | Freq: Every day | ORAL | 3 refills | Status: DC

## 2016-09-23 MED ORDER — PRIMIDONE 50 MG PO TABS: 50.0000 mg | ORAL_TABLET | Freq: Three times a day (TID) | ORAL | 3 refills | Status: DC

## 2016-09-23 MED ORDER — OMEPRAZOLE 20 MG PO CPDR: 20.0000 mg | DELAYED_RELEASE_CAPSULE | Freq: Two times a day (BID) | ORAL | 3 refills | Status: DC

## 2016-09-23 MED ORDER — FUROSEMIDE 20 MG PO TABS: 20.0000 mg | ORAL_TABLET | Freq: Every day | ORAL | 3 refills | Status: DC

## 2016-09-23 MED ORDER — PRAVASTATIN SODIUM 80 MG PO TABS: 80.0000 mg | ORAL_TABLET | Freq: Every day | ORAL | 3 refills | Status: DC

## 2016-09-23 MED ORDER — HYDROCHLOROTHIAZIDE 25 MG PO TABS: ORAL_TABLET | ORAL | 2 refills | Status: DC

## 2016-09-23 MED ORDER — HYDROCHLOROTHIAZIDE 25 MG PO TABS: 25.0000 mg | ORAL_TABLET | Freq: Every day | ORAL | 2 refills | Status: DC

## 2016-09-23 MED ORDER — GABAPENTIN 600 MG PO TABS: 1200.0000 mg | ORAL_TABLET | Freq: Two times a day (BID) | ORAL | 3 refills | Status: DC

## 2016-09-23 MED ORDER — LOSARTAN POTASSIUM 50 MG PO TABS: 50.0000 mg | ORAL_TABLET | Freq: Every day | ORAL | 3 refills | Status: DC

## 2016-09-23 NOTE — Patient Instructions (Signed)
Thank you for coming in to clinic today.  1. As discussed I don't have any specific new recommendations or changes. I think you have exhausted all of the right options.  Now we will go for a second opinion locally with another podiatry group.  Referral sent to  PODIATRY if you do not hear back within 2 weeks then go ahead and call them for appointment.  [facility] Address: [address] [place], [place] [address] Hours: Open 8AM-5PM Phone: [phone]  In future we can also discuss Orthopedics as an alternative, they often management toes and feet as well, if there is a deeper concern such as the osteomyelitis or if any need for surgery.  Please schedule a follow-up appointment with Dr. Parks Ranger as needed for toe  If you have any other questions or concerns, please feel free to call the clinic or send a message through [place]. You may also schedule an earlier appointment if necessary.  [name], DO [place]

## 2016-09-23 NOTE — Assessment & Plan Note (Signed)
Persistent chronic problem now (small dorsal ulceration) about 10 months, has had initial worsening and concerns for superficial infection, and most recently with concerns of with elevated CRP, MRSA culture and MRI findings for potential osteomyelitis, now s/p Linezolid x 3 weeks with improvement, and resolved CRP - Followed by [facility] (Dr Vickki Muff), also in interval managed by Podiatry and ID in [place], outside records scanned into chart - History of normal peripheral blood flow with ABIs by report - Complicated by chronic neuropathy of bilateral lower extremities  Plan: 1. Discussion today on other options in future for management of this non healing ulcer. Advised that I do not feel comfortable evaluating surgical options he was given. I do not have any other therapy to help his ulcer heal, as he has been seen by Wound Care as well. Reassurance no obvious superficial active infection, toe does look better, without cellulitis, no active drainage. We discussed potential still deeper osteomyelitis, could even be a case of chronic osteomyelitis, may need longer antibiotic course. - I encouraged patient to seek second opinion locally if he is interested in options before pursuing any particular surgical option. Placed referral to [facility], as next step. In future if still not improving and considering surgery, then maybe we can go the route of Orthopedics, may need bone debridement if it is chronic osteo or prolonged antibiotics, and that would be an available option  Follow-up as needed

## 2016-09-23 NOTE — Progress Notes (Signed)
Subjective:    Patient ID: [name], male    DOB: 06/03/1936, 81 y.o.   MRN: [id number]  [name] is a 81 y.o. 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 At last visit I provided reassurance that appearance did not seem to show any obvious infection, and seemed to be healing, he was already on topical debridement therapy from podiatry and topical antibiotic ointment Mupirocin. - Now he returns about 7 months later, with significant interval history. Summary involves relocating to [place] for winter, and was referred to [facility] Dr Fabiola Backer in 04/2016, he was continuing the Regranex gel 0.01% daily and then saline solution in evening and vaseline in PM, had been improving, he then saw [facility] in [place], they stated that it looked very well and almost healed should resolve in 1-2 weeks, but it did not. He did develop a blister and went back to Dr [facility], had biopsy 07/06/16, with negative results, wound culture did show MRSA, started on antibiotics but had some localized reaction to topical antibiotic. Given concern for possible deeper infection with non healing, proceeded [facility] Foot MRI which was concerning for possible early osteomyelitis. Referred to Infectious Disease (in [place]), saw Dr Myrtie Neither, started on Linezolid antibiotics for up to 3 week duration, had significant improvement, but still did not have resolved superficial ulceration, they had monitored CRP testing initially elevated, but then it had dramatically improved by report to normal levels (1.0) after  antibiotics.  Today he has persistent problem and asking for another opinion and plan on what to do next. Last seen by Dr Roselee Culver 09/19/16, within past 1 week, he had local tissue debridement of ulceration. Also discussed options in future for possibly fusion of interphalangeal joint to straight great toe and remove bony prominence to promote healing. - Chronic foot drop, wears bilateral lower ext support bracers - Denies any fevers/chills, redness, drainage of pus, swelling, or other skin changes, no new injuries  PMH Chronic Idiopathic Neuropathy, bilateral extremities - Reports chronic problem with reduced sensation in both hands and feet, he has been followed for chronically  Social History  Substance Use Topics  . Smoking status: Never Smoker  . Smokeless tobacco: Never Used  . Alcohol use 0.0 oz/week     Comment: wine in the evening    Review of Systems Per HPI unless specifically indicated above     Objective:    BP (!) 148/80 (BP Location: Left Arm, Patient Position: Sitting, Cuff Size: Normal)   Pulse 77   Temp 98.2 F (36.8 C) (Oral)   Resp 16   Ht 6' (1.829 m)   Wt 207 lb (93.9 kg)   BMI 28.07 kg/m   Wt Readings from Last 3 Encounters:  09/23/16 207 lb (93.9 kg)  04/04/16 206 lb (93.4 kg)  03/01/16 212 lb 9.6 oz (96.4 kg)    Physical Exam  Constitutional: He appears well-developed and well-nourished. No distress.  Elderly 81 yr male, currently well appearing, comfortable, cooperative. Wearing bilateral lower extremity supports for chronic foot drop.  Musculoskeletal:  Left Great Toe - Stable, chronic  flexion contracture - Chronic onychomycosis  Neurological: He is alert.  Distal sensation to light touch reduced in bilateral hands and feet, chronic unchanged  Skin: Skin is warm and dry. No rash noted. He is not diaphoretic. No erythema.  Left Great Toe 6 month interval exam of prior small chronic superficial ulceration, now appears mildly improved but  persistent 3-4 mm area, chronic ulceration without open wound or erythema, no drainage, very small fibrotic tissue seems to be slowly healing  Nursing note and vitals reviewed.  I have personally reviewed the radiology report from [facility] MRI on 07/27/16.  Scanned report into chart.  L Foot MRI report showed diffuse dorsal superficial soft tissue edema and enhancement of great toe suggesting cellulitis and mild bony edema and enhancement at base of distal phalanx possibly very early osteomyelitis vs reactive change, and TMT osteoarthritis     Assessment & Plan:   Problem List Items Addressed This Visit    Non-healing ulcer of foot ([place]) - Primary    Persistent chronic problem now (small dorsal ulceration) about 10 months, has had initial worsening and concerns for superficial infection, and most recently with concerns of with elevated CRP, MRSA culture and MRI findings for potential osteomyelitis, now s/p Linezolid x 3 weeks with improvement, and resolved CRP - Followed by [facility] (Dr Vickki Muff), also in interval managed by Podiatry and ID in [place], outside records scanned into chart - History of normal peripheral blood flow with ABIs by report - Complicated by chronic neuropathy of bilateral lower extremities  Plan: 1. Discussion today on other options in future for management of this non healing ulcer. Advised that I do not feel comfortable evaluating surgical options he was given. I do not have any other therapy to help his ulcer heal, as he has been seen by Wound Care as well. Reassurance no obvious superficial active infection, toe does look better, without cellulitis, no active drainage. We discussed potential still deeper osteomyelitis, could even be a case of chronic osteomyelitis, may need longer antibiotic course. - I encouraged patient to seek second opinion locally if he is interested in options before pursuing any particular surgical option. Placed referral to [facility], as next step. In future if still not improving and considering surgery, then maybe we can go the route of Orthopedics, may need bone debridement if it is chronic osteo or prolonged antibiotics, and that would be an available option  Follow-up as needed      Relevant Orders   Ambulatory referral to Podiatry      No orders of the defined types were placed in this encounter.     Follow up plan: Return in about 3 months (around 12/23/2016), or if symptoms worsen or fail to improve, for toe ulcer.  [name], [facility] 09/23/2016, 5:56 PM

## 2016-09-23 NOTE — Telephone Encounter (Signed)
Refilled all meds as requested 90 day supply +3 refills to [facility] - Amlodipine 2.5mg  daily - Furosemide 20mg  daily - Losartan 50mg  daily - Omeprazole 20mg  - twice daily - Pravastatin 80mg  daily - Primidone 50mg  TID - Gabapentin 600mg  tabs - take 2 (dose 1200mg ) BID - HCTZ 25mg  daily  [name], DO [facility] 09/23/2016, 6:05 PM

## 2016-09-26 ENCOUNTER — Encounter: Payer: Self-pay | Admitting: Nurse Practitioner

## 2016-09-26 ENCOUNTER — Ambulatory Visit (INDEPENDENT_AMBULATORY_CARE_PROVIDER_SITE_OTHER): Payer: Medicare Other | Admitting: Nurse Practitioner

## 2016-09-26 VITALS — BP 122/60 | HR 75 | Temp 98.4°F | Resp 16 | Ht 72.0 in | Wt 207.0 lb

## 2016-09-26 DIAGNOSIS — J3089 Other allergic rhinitis: Secondary | ICD-10-CM

## 2016-09-26 MED ORDER — FLUTICASONE PROPIONATE 50 MCG/ACT NA SUSP: 2.0000 | Freq: Every day | NASAL | 6 refills | Status: DC

## 2016-09-26 MED ORDER — BENZONATATE 100 MG PO CAPS: 100.0000 mg | ORAL_CAPSULE | Freq: Two times a day (BID) | ORAL | 0 refills | Status: DC | PRN

## 2016-09-26 NOTE — Patient Instructions (Signed)
Mr [name], [name] you for coming in to clinic today.  1. It sounds like you have allergies to the pollen. Recommend good hand washing. - Start anti-histamine loratadine (Claritin) or cetirizine (Zyrtec)10mg  daily.  - Also can use Flonase 2 sprays each nostril daily for up to 4-6 weeks.  These will help reduce your allergic response to pollen. - When you have cough, you can also take tessalon perles.  Take 1 every 12 hours as needed.  Things you can add for additional symptoms: - If congestion is worse, start OTC Mucinex (or may try Mucinex-DM for cough) up to 7-10 days then stop - Drink plenty of fluids to improve congestion - You may try over the counter Nasal Saline spray (Simply Saline, Ocean Spray) as needed to reduce congestion. - Drink warm herbal tea with honey for sore throat. - Start taking Tylenol extra strength 1 to 2 tablets every 6-8 hours for aches or fever/chills for next few days as needed.  Do not take more than 3,000 mg in 24 hours from all medicines.     Please schedule a follow-up appointment with Cassell Smiles, AGNP in 7-10 days as needed.  If you have any other questions or concerns, please feel free to call the clinic or send a message through [place]. You may also schedule an earlier appointment if necessary.  Cassell Smiles, DNP, AGNP-BC Adult Gerontology Nurse Practitioner [facility], [facility]   Allergic Rhinitis Allergic rhinitis is when the mucous membranes in the nose respond to allergens. Allergens are particles in the air that cause your body to have an allergic reaction. This causes you to release allergic antibodies. Through a chain of events, these eventually cause you to release histamine into the blood stream. Although meant to protect the body, it is this release of histamine that causes your discomfort, such as frequent sneezing, congestion, and an itchy, runny nose. What are the causes? Seasonal allergic rhinitis (hay fever) is caused by  pollen allergens that may come from grasses, trees, and weeds. Year-round allergic rhinitis (perennial allergic rhinitis) is caused by allergens such as house dust mites, pet dander, and mold spores. What are the signs or symptoms?  Nasal stuffiness (congestion).  Itchy, runny nose with sneezing and tearing of the eyes. How is this diagnosed? Your health care provider can help you determine the allergen or allergens that trigger your symptoms. If you and your health care provider are unable to determine the allergen, skin or blood testing may be used. Your health care provider will diagnose your condition after taking your health history and performing a physical exam. Your health care provider may assess you for other related conditions, such as asthma, pink eye, or an ear infection. How is this treated? Allergic rhinitis does not have a cure, but it can be controlled by:  Medicines that block allergy symptoms. These may include allergy shots, nasal sprays, and oral antihistamines.  Avoiding the allergen. Hay fever may often be treated with antihistamines in pill or nasal spray forms. Antihistamines block the effects of histamine. There are over-the-counter medicines that may help with nasal congestion and swelling around the eyes. Check with your health care provider before taking or giving this medicine. If avoiding the allergen or the medicine prescribed do not work, there are many new medicines your health care provider can prescribe. Stronger medicine may be used if initial measures are ineffective. Desensitizing injections can be used if medicine and avoidance does not work. Desensitization is when a patient  is given ongoing shots until the body becomes less sensitive to the allergen. Make sure you follow up with your health care provider if problems continue. Follow these instructions at home: It is not possible to completely avoid allergens, but you can reduce your symptoms by taking steps  to limit your exposure to them. It helps to know exactly what you are allergic to so that you can avoid your specific triggers. Contact a health care provider if:  You have a fever.  You develop a cough that does not stop easily (persistent).  You have shortness of breath.  You start wheezing.  Symptoms interfere with normal daily activities. This information is not intended to replace advice given to you by your health care provider. Make sure you discuss any questions you have with your health care provider. Document Released: 02/22/2001 Document Revised: 01/29/2016 Document Reviewed: 02/04/2013 Elsevier Interactive Patient Education  2017 [employer].

## 2016-09-26 NOTE — Progress Notes (Signed)
Subjective:    Patient ID: [name], male    DOB: 1936/01/21, 81 y.o.   MRN: [id number]  [name] is a 81 y.o. male presenting on 09/26/2016 for Cough   HPI   Possibly draining from sinuses to throat.  Cough due to "irritant" that he couldn't get relief from.  He did have a fall on Friday because he couldn't stop coughing. He is amnesic to event. EMS was called to the scene by patient's wife. Per patient, EMS stated that pt had possibly had a "nerve response" to his cough. The description is consistent with a vaso-vagal response.  Patient reports no injury from fall and no recurrences.  Patient has taken mucinex and it helped some.  He has not had any coughing this morning. Patient feels that the rain has helped.  He has also been taking Benadryl in the evenings.  History significant for moved back from winter in [place] on 4/3.  Has been fighting allergy symptoms since move back.    Social History  Substance Use Topics  . Smoking status: Never Smoker  . Smokeless tobacco: Never Used  . Alcohol use 0.0 oz/week     Comment: wine in the evening    Review of Systems Per HPI unless specifically indicated above     Objective:    BP 122/60 (BP Location: Left Arm, Patient Position: Sitting, Cuff Size: Large)   Pulse 75   Temp 98.4 F (36.9 C) (Oral)   Resp 16   Ht 6' (1.829 m)   Wt 207 lb (93.9 kg)   SpO2 98%   BMI 28.07 kg/m   Wt Readings from Last 3 Encounters:  09/26/16 207 lb (93.9 kg)  09/23/16 207 lb (93.9 kg)  04/04/16 206 lb (93.4 kg)     Physical Exam  Constitutional: He is oriented to person, place, and time. He appears well-developed and well-nourished. No distress.  HENT:  Head: Normocephalic and atraumatic.  Right Ear: Tympanic membrane, external ear and ear canal normal.  Left Ear: Tympanic membrane, external ear and ear canal normal.  Nose: Mucosal edema and rhinorrhea present. Right sinus exhibits no maxillary sinus tenderness and no frontal sinus  tenderness. Left sinus exhibits no maxillary sinus tenderness and no frontal sinus tenderness.  Mouth/Throat: Uvula is midline, oropharynx is clear and moist and mucous membranes are normal.  Hearing aids present in both ears.  Removed for exam and replaced.  Eyes: Conjunctivae and EOM are normal. Pupils are equal, round, and reactive to light.  Neck: Normal range of motion. Neck supple. No JVD present. No tracheal deviation present. No thyromegaly present.  Cardiovascular: Normal rate, regular rhythm, normal heart sounds and intact distal pulses.   Pulmonary/Chest: Effort normal and breath sounds normal.  Lymphadenopathy:    He has no cervical adenopathy.  Neurological: He is alert and oriented to person, place, and time.  Skin: Skin is warm and dry.  Psychiatric: He has a normal mood and affect. His behavior is normal. Judgment and thought content normal.       Assessment & Plan:   Problem List Items Addressed This Visit    None    Visit Diagnoses    Environmental and seasonal allergies    -  Primary Acute illness. Symptoms improving. No identifiable focal infections of ears, nose, throat.  Plan: 1. Reassurance, likely seasonal allergies. - Start anti-histamine Loratadine or Cetirizine 10mg  daily over the counter. - Can use Flonase 2 sprays each nostril daily for up to 4-6  weeks - Continue Mucinex-DM OTC up to 7-10 days then stop - Start tessalon perles 100 mg every 12 hours as needed for cough. - Avoid benadryl use r/t drowsiness 2. Supportive care with nasal saline, warm herbal tea with honey, 3. Improve hydration 4. Return criteria given     Relevant Medications   benzonatate (TESSALON) 100 MG capsule   fluticasone (FLONASE) 50 MCG/ACT nasal spray      Meds ordered this encounter  Medications  . benzonatate (TESSALON) 100 MG capsule    Sig: Take 1 capsule (100 mg total) by mouth 2 (two) times daily as needed for cough.    Dispense:  20 capsule    Refill:  0    Order  Specific Question:   Supervising Provider    Answer:   [name] [[id number]]  . fluticasone (FLONASE) 50 MCG/ACT nasal spray    Sig: Place 2 sprays into both nostrils daily.    Dispense:  16 g    Refill:  6    Order Specific Question:   Supervising Provider    Answer:   [name] [[id number]]      Follow up plan: Return if symptoms worsen or fail to improve.   Cassell Smiles, DNP, AGPCNP-BC Adult Gerontology Primary Care Nurse Practitioner [facility] 09/26/2016, 4:46 PM

## 2016-09-26 NOTE — Progress Notes (Signed)
I have reviewed this encounter including the documentation in this note and/or discussed this patient with the provider, Cassell Smiles, AGPCNP-BC. I am certifying that I agree with the content of this note as supervising physician.  [name], [facility] 09/26/2016, 5:14 PM

## 2016-10-07 DIAGNOSIS — H903 Sensorineural hearing loss, bilateral: Secondary | ICD-10-CM | POA: Diagnosis not present

## 2016-10-07 DIAGNOSIS — J301 Allergic rhinitis due to pollen: Secondary | ICD-10-CM | POA: Diagnosis not present

## 2016-10-10 DIAGNOSIS — L821 Other seborrheic keratosis: Secondary | ICD-10-CM | POA: Diagnosis not present

## 2016-10-10 DIAGNOSIS — D225 Melanocytic nevi of trunk: Secondary | ICD-10-CM | POA: Diagnosis not present

## 2016-10-10 DIAGNOSIS — Z85828 Personal history of other malignant neoplasm of skin: Secondary | ICD-10-CM | POA: Diagnosis not present

## 2016-10-10 DIAGNOSIS — L570 Actinic keratosis: Secondary | ICD-10-CM | POA: Diagnosis not present

## 2016-10-10 DIAGNOSIS — D485 Neoplasm of uncertain behavior of skin: Secondary | ICD-10-CM | POA: Diagnosis not present

## 2016-10-10 DIAGNOSIS — L814 Other melanin hyperpigmentation: Secondary | ICD-10-CM | POA: Diagnosis not present

## 2016-10-12 DIAGNOSIS — G609 Hereditary and idiopathic neuropathy, unspecified: Secondary | ICD-10-CM | POA: Diagnosis not present

## 2016-10-12 DIAGNOSIS — L97522 Non-pressure chronic ulcer of other part of left foot with fat layer exposed: Secondary | ICD-10-CM | POA: Diagnosis not present

## 2016-10-13 ENCOUNTER — Telehealth: Payer: Self-pay | Admitting: Nurse Practitioner

## 2016-10-13 DIAGNOSIS — L97521 Non-pressure chronic ulcer of other part of left foot limited to breakdown of skin: Secondary | ICD-10-CM

## 2016-10-13 NOTE — Telephone Encounter (Signed)
Notes and demo and insurance info were faxed they will call patient with appointment date and time.

## 2016-10-13 NOTE — Telephone Encounter (Signed)
Pt's wife asked for a referral to [facility] for pt's left toe for a problem that has been ongoing for a year and a half.  Her call back number is [phone]

## 2016-10-13 NOTE — Telephone Encounter (Signed)
Last seen 09/23/16 for same complaint, see office visit for clinical details.  Placed new referral to Podiatry / Wound Healing center [facility]   Dr Buddy Duty, available on referral order - may be scheduled with any available podiatry/provider at this location.  [place] [address] [place], [place] [address]  Phone: [place] 10/13/2016, 12:26 PM

## 2016-10-18 DIAGNOSIS — M86672 Other chronic osteomyelitis, left ankle and foot: Secondary | ICD-10-CM | POA: Diagnosis not present

## 2016-10-23 ENCOUNTER — Ambulatory Visit
Admission: EM | Admit: 2016-10-23 | Discharge: 2016-10-23 | Disposition: A | Discharge status: Home / Self Care | Payer: Medicare Other | Attending: Emergency Medicine | Admitting: Emergency Medicine

## 2016-10-23 DIAGNOSIS — E871 Hypo-osmolality and hyponatremia: Secondary | ICD-10-CM | POA: Diagnosis not present

## 2016-10-23 DIAGNOSIS — Z7982 Long term (current) use of aspirin: Secondary | ICD-10-CM | POA: Diagnosis not present

## 2016-10-23 DIAGNOSIS — R627 Adult failure to thrive: Secondary | ICD-10-CM | POA: Diagnosis not present

## 2016-10-23 DIAGNOSIS — R0602 Shortness of breath: Secondary | ICD-10-CM | POA: Diagnosis not present

## 2016-10-23 DIAGNOSIS — R0789 Other chest pain: Secondary | ICD-10-CM

## 2016-10-23 DIAGNOSIS — R2681 Unsteadiness on feet: Secondary | ICD-10-CM | POA: Diagnosis not present

## 2016-10-23 DIAGNOSIS — R202 Paresthesia of skin: Secondary | ICD-10-CM | POA: Diagnosis not present

## 2016-10-23 DIAGNOSIS — K5730 Diverticulosis of large intestine without perforation or abscess without bleeding: Secondary | ICD-10-CM | POA: Diagnosis not present

## 2016-10-23 DIAGNOSIS — I2510 Atherosclerotic heart disease of native coronary artery without angina pectoris: Secondary | ICD-10-CM | POA: Diagnosis not present

## 2016-10-23 DIAGNOSIS — K429 Umbilical hernia without obstruction or gangrene: Secondary | ICD-10-CM | POA: Diagnosis not present

## 2016-10-23 DIAGNOSIS — I252 Old myocardial infarction: Secondary | ICD-10-CM | POA: Diagnosis not present

## 2016-10-23 DIAGNOSIS — E7800 Pure hypercholesterolemia, unspecified: Secondary | ICD-10-CM | POA: Diagnosis not present

## 2016-10-23 DIAGNOSIS — Z85821 Personal history of Merkel cell carcinoma: Secondary | ICD-10-CM | POA: Diagnosis not present

## 2016-10-23 DIAGNOSIS — R10814 Left lower quadrant abdominal tenderness: Secondary | ICD-10-CM | POA: Diagnosis not present

## 2016-10-23 DIAGNOSIS — G603 Idiopathic progressive neuropathy: Secondary | ICD-10-CM | POA: Diagnosis not present

## 2016-10-23 DIAGNOSIS — R10815 Periumbilic abdominal tenderness: Secondary | ICD-10-CM

## 2016-10-23 DIAGNOSIS — M6281 Muscle weakness (generalized): Secondary | ICD-10-CM | POA: Diagnosis not present

## 2016-10-23 DIAGNOSIS — G609 Hereditary and idiopathic neuropathy, unspecified: Secondary | ICD-10-CM | POA: Diagnosis not present

## 2016-10-23 DIAGNOSIS — Z885 Allergy status to narcotic agent status: Secondary | ICD-10-CM | POA: Diagnosis not present

## 2016-10-23 DIAGNOSIS — R935 Abnormal findings on diagnostic imaging of other abdominal regions, including retroperitoneum: Secondary | ICD-10-CM | POA: Diagnosis not present

## 2016-10-23 DIAGNOSIS — R531 Weakness: Secondary | ICD-10-CM | POA: Diagnosis not present

## 2016-10-23 DIAGNOSIS — K219 Gastro-esophageal reflux disease without esophagitis: Secondary | ICD-10-CM | POA: Diagnosis not present

## 2016-10-23 DIAGNOSIS — Z951 Presence of aortocoronary bypass graft: Secondary | ICD-10-CM | POA: Diagnosis not present

## 2016-10-23 DIAGNOSIS — R0600 Dyspnea, unspecified: Secondary | ICD-10-CM

## 2016-10-23 DIAGNOSIS — I10 Essential (primary) hypertension: Secondary | ICD-10-CM | POA: Diagnosis not present

## 2016-10-23 NOTE — ED Triage Notes (Signed)
Pt says his hands are feeling tingly and he just doesn't feel right.

## 2016-10-23 NOTE — ED Provider Notes (Signed)
HPI  SUBJECTIVE:  [name] is a 81 y.o. male who presents with multiple complaints. First, he reports the acute onset of tingling in his bilateral hands starting at 0800 this morning. States it is constant. There are no aggravating or alleviating factors. He has not tried anything for this. It is not associated with use. He denies numbness, weakness, trauma, change in his physical activity. He denies arm or leg weakness, facial droop, dysarthria, aphasia. Denies arm pain, neck pain. No perioral tingling or tingling elsewhere. He denies chest pain, pressure, heaviness. He states that he has never had symptoms like this before although per chart review he was seen in the [facility] emergency Department on 01/04/2016 for bilateral hand tingling. They found that he was a little dehydrated and was given IV fluids. Cardiac labs were normal. He was found to have several  pulmonary nodules on CT.  Second, he reports shortness of breath starting this morning which is worse with talking. It does not seem to be associated with walking around or lying down. He has not tried anything for this. There is nothing that makes him feel better. He denies dyspnea on exertion, coughing, wheezing, lower extremity edema, orthopnea, nocturia, PND, unintentional weight gain, abdominal pain. He reports posterior right-sided rib pain described as a "sore muscle". He denies pleuritic pain. He denies hemoptysis. He has never had symptoms like this before. Patient states that he "feels horrible", feels lightheaded and unsteady with walking. He denies vertigo or consistently falling to one side. He is unable to further characterize this feeling. Denies nausea, vomiting, fevers, abdominal pain, urinary complaints. Had 2 episodes of loose stools 2 days ago, but this is since resolved. He is on a new unknown antibiotic for a foot wound. Has been taking this for the past week without any problems  Patient has an extensive medical history  including coronary artery disease, MI status post CABG, CHF,, hypertension, hypercholesterolemia, peripheral neuropathy in his hands and feet, peripheral vascular disease no history of DVT, PE, diabetes, stroke, A. fib, arrhythmia, lung disease. He is not a smoker. DPO:EUMPNTIRWER, Devonne Doughty, DO .    Past Medical History:  Diagnosis Date  . Arthritis   . Benign essential tremor   . Fracture, femur ([place]) 2012  . GERD (gastroesophageal reflux disease)   . H/O echocardiogram    05/2013  . Heart disease   . Hx of colonoscopy    04/22/2013  . Hypercholesterolemia   . Hypertension   . Myocardial infarction ([place])    1987  . Polyneuropathy   . Prostate cancer ([place]) 2004  . Superficial hematoma     Past Surgical History:  Procedure Laterality Date  . BASAL CELL CARCINOMA EXCISION    . COLONOSCOPY  05/01/2009   Positive for colonic polyps  . COLONOSCOPY WITH PROPOFOL N/A 02/24/2016   Procedure: COLONOSCOPY WITH PROPOFOL;  Surgeon: Christene Lye, MD;  Location: [facility] ENDOSCOPY;  Service: Endoscopy;  Laterality: N/A;  . CORONARY ARTERY BYPASS GRAFT  1987   X2  . HERNIA REPAIR  1990  . PROSTATE SURGERY  2004    Family History  Problem Relation Age of Onset  . Heart disease Mother     Social History  Substance Use Topics  . Smoking status: Never Smoker  . Smokeless tobacco: Never Used  . Alcohol use 0.0 oz/week     Comment: wine in the evening    No current facility-administered medications for this encounter.   Current Outpatient Prescriptions:  .  Alpha Lipoic Acid 200 MG CAPS, Take 2 capsules by mouth daily., Disp: , Rfl:  .  amLODipine (NORVASC) 2.5 MG tablet, Take 1 tablet (2.5 mg total) by mouth daily., Disp: 90 tablet, Rfl: 3 .  aspirin 81 MG tablet, Take 81 mg by mouth daily., Disp: , Rfl:  .  B Complex Vitamins (VITAMIN-B COMPLEX PO), Take 1 tablet by mouth daily. Reported on 10/06/2015, Disp: , Rfl:  .  benzonatate (TESSALON) 100 MG capsule, Take 1 capsule  (100 mg total) by mouth 2 (two) times daily as needed for cough., Disp: 20 capsule, Rfl: 0 .  Biotin 1000 MCG tablet, Take 1,000 mcg by mouth daily., Disp: , Rfl:  .  Calcium Carb-Cholecalciferol 500-100 MG-UNIT CHEW, Chew 500 mg by mouth daily., Disp: , Rfl:  .  cyanocobalamin 1000 MCG tablet, Take 100 mcg by mouth daily., Disp: , Rfl:  .  fluticasone (FLONASE) 50 MCG/ACT nasal spray, Place 2 sprays into both nostrils daily., Disp: 16 g, Rfl: 6 .  folic acid (FOLVITE) 1 MG tablet, Take 1 mg by mouth daily., Disp: , Rfl:  .  furosemide (LASIX) 20 MG tablet, Take 1 tablet (20 mg total) by mouth daily., Disp: 90 tablet, Rfl: 3 .  gabapentin (NEURONTIN) 600 MG tablet, Take 2 tablets (1,200 mg total) by mouth 2 (two) times daily., Disp: 360 tablet, Rfl: 3 .  Glucosamine Sulfate 1000 MG CAPS, Take 1 capsule by mouth daily., Disp: , Rfl:  .  hydrochlorothiazide (HYDRODIURIL) 25 MG tablet, Take 1 tablet (25 mg total) by mouth daily., Disp: 90 tablet, Rfl: 2 .  Lecithin 400 MG CAPS, Take 400 mg by mouth daily., Disp: , Rfl:  .  losartan (COZAAR) 50 MG tablet, Take 1 tablet (50 mg total) by mouth daily., Disp: 90 tablet, Rfl: 3 .  Melatonin 3 MG CAPS, Take 3 mg by mouth at bedtime., Disp: , Rfl:  .  mupirocin cream (BACTROBAN) 2 %, Apply 1 application topically 3 (three) times daily., Disp: , Rfl:  .  Omega 3 1000 MG CAPS, Take 1,000 mg by mouth daily., Disp: , Rfl:  .  omeprazole (PRILOSEC) 20 MG capsule, Take 1 capsule (20 mg total) by mouth 2 (two) times daily before a meal. Take 2 capsule by mouth daily, Disp: 180 capsule, Rfl: 3 .  ondansetron (ZOFRAN ODT) 8 MG disintegrating tablet, Take 1 tablet (8 mg total) by mouth 2 (two) times daily., Disp: 6 tablet, Rfl: 0 .  polyethylene glycol powder (GLYCOLAX/MIRALAX) powder, 255 grams one bottle for colonoscopy prep, Disp: 255 g, Rfl: 0 .  potassium chloride 20 MEQ TBCR, Take 2 tablets each day, Disp: 180 tablet, Rfl: 3 .  pravastatin (PRAVACHOL) 80 MG  tablet, Take 1 tablet (80 mg total) by mouth daily., Disp: 90 tablet, Rfl: 3 .  primidone (MYSOLINE) 50 MG tablet, Take 1 tablet (50 mg total) by mouth 3 (three) times daily., Disp: 270 tablet, Rfl: 3 .  Selenium 200 MCG CAPS, Take 200 mcg by mouth daily., Disp: , Rfl:  .  tretinoin (RETIN-A) 0.05 % cream, Apply 1 application topically at bedtime., Disp: , Rfl:   Allergies  Allergen Reactions  . Codeine Other (See Comments)  . Indocin [Indomethacin] Other (See Comments)  . Latex Other (See Comments)  . Other Nausea And Vomiting    mussels     ROS  As noted in HPI.   Physical Exam  BP 128/80 (BP Location: Left Arm)   Pulse 78   Temp 97.9  F (36.6 C) (Oral)   Resp 18   Ht 6' (1.829 m)   Wt 210 lb (95.3 kg)   SpO2 100%   BMI 28.48 kg/m   Constitutional: Well developed, well nourished, no acute distress Eyes: PERRL, EOMI, conjunctiva normal bilaterally HENT: Normocephalic, atraumatic,mucus membranes moist Respiratory: Clear to auscultation bilaterally, no rales, no wheezing, no rhonchi. Positive tenderness along the posterior right ribs. No bruising, crepitus, rash. Cardiovascular: Normal rate and rhythm, no murmurs, no gallops, no rubs GI: Soft, nondistended, normal bowel soundsPositive periumbilical tenderness. no rebound, no guarding Back: no CVAT skin: No rash, skin intact Musculoskeletal:  no C-spine, trapezial tenderness. No tenderness over bilateral upper extremities. Grip strength 5/5 and equal bilaterally. Sensation light touch and temperature intact equally over bilateral upper extremities. RP 2+ and equal. No pain with moving wrist and hand through full range of motion.No edema, no tenderness, no deformities Neurologic: Alert & oriented x 3, CN II-XII intact, no motor deficits,  no pronator drift upper and lower extremities, sensation grossly intact Psychiatric: Speech and behavior appropriate   ED Course   Medications - No data to display  Orders Placed This  Encounter  Procedures  . EKG 12-Lead    Standing Status:   Standing    Number of Occurrences:   1  . ED EKG    Standing Status:   Standing    Number of Occurrences:   1    Order Specific Question:   Reason for Exam    Answer:   Shortness of breath   No results found for this or any previous visit (from the past 24 hour(s)). No results found.  ED Clinical Impression  Paresthesias  Dyspnea, unspecified type  Periumbilical abdominal tenderness without rebound tenderness  Chest wall tenderness  ED Assessment/Plan  EKG: Normal sinus rhythm, rate 79. Normal axis, normal intervals. No hypertrophy. No ST-T wave changes compared to EKG from 12/2015  Previous records reviewed. As noted in history of present illness.  Given the patient's abdominal tenderness, posterior rib tenderness, and his vague presentation, the differential is broad. Doubt stroke because his bilateral symptoms, and is otherwise completely neurologically intact on testing.  Feel that workup is beyond the scope of this urgent care. Patient wishes to go to the [facility] emergency department. His EKG is normal, his vitals are normal and he is in no respiratory distress, so I think that the patient is safe to go by private vehicle. Discussed rationale for transfer with patient. He agrees to go immediately to the [facility] ED. Notified charge Therapist, sports.   No orders of the defined types were placed in this encounter.   *This clinic note was created using Dragon dictation software. Therefore, there may be occasional mistakes despite careful proofreading.  ?   Melynda Ripple, MD 10/23/16 1147

## 2016-10-23 NOTE — Discharge Instructions (Signed)
Go immediately to the [facility] ER. Let them know immediately if you start having chest pain, pressure, heaviness, nausea, sweatiness with this shortness of breath, or other concerns

## 2016-10-24 ENCOUNTER — Encounter: Payer: Self-pay | Admitting: Family Medicine

## 2016-10-24 ENCOUNTER — Ambulatory Visit (INDEPENDENT_AMBULATORY_CARE_PROVIDER_SITE_OTHER): Payer: Medicare Other | Admitting: Family Medicine

## 2016-10-24 VITALS — BP 123/68 | HR 76 | Temp 99.2°F | Resp 16 | Ht 72.0 in | Wt 206.0 lb

## 2016-10-24 DIAGNOSIS — E871 Hypo-osmolality and hyponatremia: Secondary | ICD-10-CM | POA: Diagnosis not present

## 2016-10-24 NOTE — Progress Notes (Signed)
Subjective:    Patient ID: [name], male    DOB: [birth year], 81 y.o.   MRN: [id number]  [name] is a 81 y.o. male presenting on 10/24/2016 for Hospitalization Follow-up ( tingling in his bilateral hands )   HPI   ED Follow-up HYPONATREMIA / Paresthesias in Hands - Reports new problem, woke up Sunday morning, felt both hands (fingers and palm, not arms) numb and tingling, "warm" and possibly feverish but "not running fever", felt nauseas without vomiting. Went directly to Urgent Care ([facility]) after initial vitals normal, did not have clear cause of his symptoms, did not think CVA, but urged him to seek next treatment at ED. - Then sent directly to [facility] ED (private vehicle, wife drove), he was checked in immediately, had EKG and Chest X-ray, labs, showed Hyponatremia to 127, additional work-up with Abdominal CT. - Known chronic polyneuropathy in lower ext, previously [facility] for lower extremity neuropathy, they had some concerns now if further work-up needed for upper extremities, ED recommendations were to consider outpatient MRI C-spine vs whole spine for paresthesias to rule out spinal compression vs lesion - History of chronic hyponatremia, normally salt restrict diet. - ED requested that he stay the night for admission, patient and wife declined only if promised to go to PCP within 24 hours - By time arrival home from ED, both hands were no longer tingling, still felt mildly nauseas. Used Zofran 23m ODT at home with resolution, tolerated dinner well. - Today he feels mostly back to normal, with resolved upper extremity numbness and paresthesias. Woke up and went walking, running errands, and normal chores. - Regarding sodium he admits to drinking frequent fluids primarily seltzer water 6-8 glasses daily - Additionally recent change on 10/17/16 he had [place] non [place], and he was resumed on another  21 day course of Linezolid 601mdaily, lastly he is wondering if this could have affected his symptoms, he will see Dr IsBland Spann 1-2 weeks [place] Denies new numbness, weakness, joint problem, headache, dizziness, lightheadedness, near syncope, seizures, nausea, vomiting  Social History  Substance Use Topics  . Smoking status: Never Smoker  . Smokeless tobacco: Never Used  . Alcohol use 0.0 oz/week     Comment: wine in the evening    Review of Systems Per HPI unless specifically indicated above     Objective:    BP 123/68   Pulse 76   Temp 99.2 F (37.3 C) (Oral)   Resp 16   Ht 6' (1.829 m)   Wt 206 lb (93.4 kg)   BMI 27.94 kg/m   Wt Readings from Last 3 Encounters:  10/24/16 206 lb (93.4 kg)  10/23/16 210 lb (95.3 kg)  09/26/16 207 lb (93.9 kg)    Physical Exam  Constitutional: He is oriented to person, place, and time. He appears well-developed and well-nourished. No distress.  Well-appearing [age]ear old male, comfortable, cooperative  HENT:  Head: Normocephalic and atraumatic.  Mouth/Throat: Oropharynx is clear and moist.  Moist mucus membranes and tongue  Eyes: Conjunctivae are normal.  Cardiovascular: Normal rate, regular rhythm, normal heart sounds and intact distal pulses.   No murmur heard. Pulmonary/Chest: Effort normal and breath sounds normal. No respiratory distress. He has no wheezes. He has no rales.  Musculoskeletal: Normal range of motion. He exhibits no edema or tenderness.  Bilateral Hand/Wrist Inspection: Bulky appearance MCP and CMC  joints, symmetrical, no edema or erythema. Palpation: Non tender hand / wrist, carpal bones, including MCP, base of thumb. No distinct anatomical snuff box or scaphoid tenderness. ROM: full active wrist ROM flex / ext, ulnar / radial deviation Special Testing: Negative Tinel's median nerve Strength: 5/5 grip, thumb opposition, wrist flex/ext Neurovascular: distally intact  Neurological: He is  alert and oriented to person, place, and time. No cranial nerve deficit.  Distal sensation to light touch is stable without change, has some minor reduced in bilateral hands at baseline and feet unchanged from last visit  Skin: Skin is warm and dry. No rash noted. He is not diaphoretic. No erythema.  Nursing note and vitals reviewed.  Results for orders placed or performed in visit on 02/09/16  COMPLETE METABOLIC PANEL WITH GFR  Result Value Ref Range   Sodium 135 135 - 146 mmol/L   Potassium 3.7 3.5 - 5.3 mmol/L   Chloride 94 (L) 98 - 110 mmol/L   CO2 29 20 - 31 mmol/L   Glucose, Bld 93 65 - 99 mg/dL   BUN 12 7 - 25 mg/dL   Creat 0.83 0.70 - 1.18 mg/dL   Total Bilirubin 0.5 0.2 - 1.2 mg/dL   Alkaline Phosphatase 65 40 - 115 U/L   AST 33 10 - 35 U/L   ALT 36 9 - 46 U/L   Total Protein 6.6 6.1 - 8.1 g/dL   Albumin 4.2 3.6 - 5.1 g/dL   Calcium 9.0 8.6 - 10.3 mg/dL   GFR, Est African American >89 >=60 mL/min   GFR, Est Non African American 84 >=60 mL/min  CBC with Differential  Result Value Ref Range   WBC 3.5 (L) 3.8 - 10.8 K/uL   RBC 3.71 (L) 4.20 - 5.80 MIL/uL   Hemoglobin 12.8 (L) 13.2 - 17.1 g/dL   HCT 37.6 (L) 38.5 - 50.0 %   MCV 101.3 (H) 80.0 - 100.0 fL   MCH 34.5 (H) 27.0 - 33.0 pg   MCHC 34.0 32.0 - 36.0 g/dL   RDW 13.6 11.0 - 15.0 %   Platelets 241 140 - 400 K/uL   MPV 9.4 7.5 - 12.5 fL   Neutro Abs 1,575 1,500 - 7,800 cells/uL   Lymphs Abs 1,400 850 - 3,900 cells/uL   Monocytes Absolute 350 200 - 950 cells/uL   Eosinophils Absolute 140 15 - 500 cells/uL   Basophils Absolute 35 0 - 200 cells/uL   Neutrophils Relative % 45 %   Lymphocytes Relative 40 %   Monocytes Relative 10 %   Eosinophils Relative 4 %   Basophils Relative 1 %   Smear Review Criteria for review not met   Lipid Profile  Result Value Ref Range   Cholesterol 161 125 - 200 mg/dL   Triglycerides 257 (H) <150 mg/dL   HDL 72 >=40 mg/dL   Total CHOL/HDL Ratio 2.2 <=5.0 Ratio   VLDL 51 (H) <30  mg/dL   LDL Cholesterol 38 <130 mg/dL      Assessment & Plan:   Problem List Items Addressed This Visit    None    Visit Diagnoses    Acute hyponatremia    -  Primary  Suspected most likely etiology for symptoms since prior lab review Na in 130s and in normal range previously, now with Na 127, seems that prior chronic hypoNa history was prior and now this may be more acute episode causing symptoms. Considered moderate based on Na 127, and moderate symptoms, without severe complications or  seizures. Resolved with IVF and management in ED, so less likely to be side effect of Linezolid antibiotic, evaluations had been negative for focal neuro deficit or CVA. Questioned if C-spine lesion or compression, less likely since now resolved  Plan: 1. Discussion on Hyponatremia diagnosis, management and treatment 2. Given chronic problem but now acute episode is resolved, prefer to hold on any significant therapy at this time 3. Regulate fluid intake, avoid excessive fluid intake and minimal amount, try to find balance to stay well hydrated, avoid hypovolemic hyponatremia as well 4. May continue current meds including HCTZ, Linezolid 5. Re-check BMET later this week Friday 5/18, follow-up results, if normalized continue to monitor while in [place] can re-check again, or if low again can repeat in [place], discuss [place] with Dr Bland Span 6. May need to come off Thiazide if recurrent 7. Advised if another acute symptomatic episode, again needs to go to hospital ED will likely need labs and IV treatment, limited outpatient options. Future consider referral to [facility] for more advanced chronic electrolyte management if needed  Additionally if only nerve symptoms recur and no other issue not resolved by IVF or other management and normal sodium, then consider C-spine may need MRI      Meds ordered this encounter  Medications  . DISCONTD: ondansetron (ZOFRAN-ODT) 8 MG disintegrating tablet    Sig: Take 8 mg  by mouth.  . linezolid (ZYVOX) 600 MG tablet    Sig: Take 600 mg by mouth 2 (two) times daily.      Follow up plan: Return in about 3 months (around 01/24/2017) for Hyponatremia / Chronic Foot Ulcer.  [name], [facility] 10/24/2016, 5:16 PM

## 2016-10-24 NOTE — Patient Instructions (Addendum)
Thank you for coming to the clinic today.  1. Most likely your symptoms were caused by Low Sodium, Na 127 was result from [facility] ED. In past your sodium has been better in 130s to 135, 138, 140 in past 2 years from our records  Low sodium can cause your symptoms of nerve sensation changes, numbness, headaches, nausea, among other symptoms including severely seizures or other serious neurological concerns.  You did the right thing to go to [facility] ED for further evaluation. This is a difficult issue to treat and often needs quick lab results to see what the sodium is and likely need some form of IV fluid treatment  Try to drink a regular level of water / seltzer water or liquids, normal amount 4-6 cups or glasses, daily do not drink excessive amounts cause this can lower your sodium further, if you are dehydration may be more prone to this as well.  No changes today based on review of all of your testing and treatment at [facility]  We will check Sodium and basic chemistry this week, and send you result. If still low, then can have other doctors in [place] re-check labs with Sodium.  I do not think this is due to Linezolid, can review this with Dr Bland Span in [place].  You will be due for NON fasting BLOOD WORK  - Please go ahead and schedule a "Lab Only" visit in the morning at the clinic for lab draw preferred on Friday 5/18  For Lab Results, once available within 2-3 days of blood draw, you can can log in to MyChart online to view your results and a brief explanation. Also, we can discuss results at next follow-up visit.  Please schedule a follow-up appointment with Dr. Parks Ranger in 3 months as needed for Hyponatremia  If you have any other questions or concerns, please feel free to call the clinic or send a message through [place]. You may also schedule an earlier appointment if necessary.  [name], DO [place]

## 2016-10-28 ENCOUNTER — Other Ambulatory Visit: Payer: Medicare Other

## 2016-10-28 DIAGNOSIS — E871 Hypo-osmolality and hyponatremia: Secondary | ICD-10-CM | POA: Diagnosis not present

## 2016-10-29 LAB — BASIC METABOLIC PANEL WITH GFR
BUN: 14 mg/dL (ref 7–25)
CHLORIDE: 90 mmol/L — AB (ref 98–110)
CO2: 28 mmol/L (ref 20–31)
Calcium: 9.3 mg/dL (ref 8.6–10.3)
Creat: 0.89 mg/dL (ref 0.70–1.18)
GFR, EST NON AFRICAN AMERICAN: 81 mL/min (ref >=60)
GLUCOSE: 101 mg/dL — AB (ref 65–99)
POTASSIUM: 3.5 mmol/L (ref 3.5–5.3)
Sodium: 127 mmol/L — ABNORMAL LOW (ref 135–146)

## 2016-10-31 DIAGNOSIS — G629 Polyneuropathy, unspecified: Secondary | ICD-10-CM | POA: Diagnosis not present

## 2016-10-31 DIAGNOSIS — I252 Old myocardial infarction: Secondary | ICD-10-CM | POA: Diagnosis not present

## 2016-10-31 DIAGNOSIS — Z7951 Long term (current) use of inhaled steroids: Secondary | ICD-10-CM | POA: Diagnosis not present

## 2016-10-31 DIAGNOSIS — Z885 Allergy status to narcotic agent status: Secondary | ICD-10-CM | POA: Diagnosis not present

## 2016-10-31 DIAGNOSIS — Z7982 Long term (current) use of aspirin: Secondary | ICD-10-CM | POA: Diagnosis not present

## 2016-10-31 DIAGNOSIS — L97529 Non-pressure chronic ulcer of other part of left foot with unspecified severity: Secondary | ICD-10-CM | POA: Diagnosis not present

## 2016-10-31 DIAGNOSIS — S91109S Unspecified open wound of unspecified toe(s) without damage to nail, sequela: Secondary | ICD-10-CM | POA: Diagnosis not present

## 2016-10-31 DIAGNOSIS — Z79899 Other long term (current) drug therapy: Secondary | ICD-10-CM | POA: Diagnosis not present

## 2016-10-31 DIAGNOSIS — K219 Gastro-esophageal reflux disease without esophagitis: Secondary | ICD-10-CM | POA: Diagnosis not present

## 2016-10-31 DIAGNOSIS — G250 Essential tremor: Secondary | ICD-10-CM | POA: Diagnosis not present

## 2016-10-31 DIAGNOSIS — E7800 Pure hypercholesterolemia, unspecified: Secondary | ICD-10-CM | POA: Diagnosis not present

## 2016-10-31 DIAGNOSIS — Z8739 Personal history of other diseases of the musculoskeletal system and connective tissue: Secondary | ICD-10-CM | POA: Diagnosis not present

## 2016-10-31 DIAGNOSIS — L97524 Non-pressure chronic ulcer of other part of left foot with necrosis of bone: Secondary | ICD-10-CM | POA: Diagnosis not present

## 2016-11-02 DIAGNOSIS — M86172 Other acute osteomyelitis, left ankle and foot: Secondary | ICD-10-CM | POA: Diagnosis not present

## 2016-11-02 DIAGNOSIS — A488 Other specified bacterial diseases: Secondary | ICD-10-CM | POA: Diagnosis not present

## 2016-11-02 DIAGNOSIS — L02612 Cutaneous abscess of left foot: Secondary | ICD-10-CM | POA: Diagnosis not present

## 2016-11-02 DIAGNOSIS — R2689 Other abnormalities of gait and mobility: Secondary | ICD-10-CM | POA: Diagnosis not present

## 2016-11-02 DIAGNOSIS — R2242 Localized swelling, mass and lump, left lower limb: Secondary | ICD-10-CM | POA: Diagnosis not present

## 2016-11-02 DIAGNOSIS — L97822 Non-pressure chronic ulcer of other part of left lower leg with fat layer exposed: Secondary | ICD-10-CM | POA: Diagnosis not present

## 2016-11-08 DIAGNOSIS — M868X7 Other osteomyelitis, ankle and foot: Secondary | ICD-10-CM | POA: Diagnosis not present

## 2016-11-09 DIAGNOSIS — M868X7 Other osteomyelitis, ankle and foot: Secondary | ICD-10-CM | POA: Diagnosis not present

## 2016-11-16 DIAGNOSIS — M8619 Other acute osteomyelitis, multiple sites: Secondary | ICD-10-CM | POA: Diagnosis not present

## 2016-11-22 DIAGNOSIS — M86672 Other chronic osteomyelitis, left ankle and foot: Secondary | ICD-10-CM | POA: Diagnosis not present

## 2016-11-22 DIAGNOSIS — M868X7 Other osteomyelitis, ankle and foot: Secondary | ICD-10-CM | POA: Diagnosis not present

## 2016-12-02 DIAGNOSIS — L97822 Non-pressure chronic ulcer of other part of left lower leg with fat layer exposed: Secondary | ICD-10-CM | POA: Diagnosis not present

## 2016-12-02 DIAGNOSIS — M86172 Other acute osteomyelitis, left ankle and foot: Secondary | ICD-10-CM | POA: Diagnosis not present

## 2016-12-02 DIAGNOSIS — R2689 Other abnormalities of gait and mobility: Secondary | ICD-10-CM | POA: Diagnosis not present

## 2016-12-07 DIAGNOSIS — M86179 Other acute osteomyelitis, unspecified ankle and foot: Secondary | ICD-10-CM | POA: Diagnosis not present

## 2016-12-07 DIAGNOSIS — G609 Hereditary and idiopathic neuropathy, unspecified: Secondary | ICD-10-CM | POA: Diagnosis not present

## 2016-12-19 DIAGNOSIS — L97529 Non-pressure chronic ulcer of other part of left foot with unspecified severity: Secondary | ICD-10-CM | POA: Diagnosis not present

## 2016-12-19 DIAGNOSIS — M869 Osteomyelitis, unspecified: Secondary | ICD-10-CM | POA: Diagnosis not present

## 2016-12-19 DIAGNOSIS — L239 Allergic contact dermatitis, unspecified cause: Secondary | ICD-10-CM | POA: Diagnosis not present

## 2016-12-26 DIAGNOSIS — M869 Osteomyelitis, unspecified: Secondary | ICD-10-CM | POA: Diagnosis not present

## 2016-12-26 DIAGNOSIS — M8618 Other acute osteomyelitis, other site: Secondary | ICD-10-CM | POA: Diagnosis not present

## 2016-12-26 DIAGNOSIS — M84672A Pathological fracture in other disease, left ankle, initial encounter for fracture: Secondary | ICD-10-CM | POA: Diagnosis present

## 2016-12-26 DIAGNOSIS — G250 Essential tremor: Secondary | ICD-10-CM | POA: Diagnosis present

## 2016-12-26 DIAGNOSIS — L539 Erythematous condition, unspecified: Secondary | ICD-10-CM | POA: Diagnosis not present

## 2016-12-26 DIAGNOSIS — L97529 Non-pressure chronic ulcer of other part of left foot with unspecified severity: Secondary | ICD-10-CM | POA: Diagnosis not present

## 2016-12-26 DIAGNOSIS — L03116 Cellulitis of left lower limb: Secondary | ICD-10-CM | POA: Diagnosis present

## 2016-12-26 DIAGNOSIS — M7989 Other specified soft tissue disorders: Secondary | ICD-10-CM | POA: Diagnosis not present

## 2016-12-26 DIAGNOSIS — M86672 Other chronic osteomyelitis, left ankle and foot: Secondary | ICD-10-CM | POA: Diagnosis not present

## 2016-12-26 DIAGNOSIS — I252 Old myocardial infarction: Secondary | ICD-10-CM | POA: Diagnosis not present

## 2016-12-26 DIAGNOSIS — Z85828 Personal history of other malignant neoplasm of skin: Secondary | ICD-10-CM | POA: Diagnosis not present

## 2016-12-26 DIAGNOSIS — L97521 Non-pressure chronic ulcer of other part of left foot limited to breakdown of skin: Secondary | ICD-10-CM | POA: Diagnosis present

## 2016-12-26 DIAGNOSIS — Z7982 Long term (current) use of aspirin: Secondary | ICD-10-CM | POA: Diagnosis not present

## 2016-12-26 DIAGNOSIS — Z951 Presence of aortocoronary bypass graft: Secondary | ICD-10-CM | POA: Diagnosis not present

## 2016-12-26 DIAGNOSIS — L03032 Cellulitis of left toe: Secondary | ICD-10-CM | POA: Diagnosis not present

## 2016-12-26 DIAGNOSIS — G629 Polyneuropathy, unspecified: Secondary | ICD-10-CM | POA: Diagnosis not present

## 2016-12-26 DIAGNOSIS — M86172 Other acute osteomyelitis, left ankle and foot: Secondary | ICD-10-CM | POA: Diagnosis not present

## 2016-12-26 DIAGNOSIS — K219 Gastro-esophageal reflux disease without esophagitis: Secondary | ICD-10-CM | POA: Diagnosis present

## 2016-12-26 DIAGNOSIS — I10 Essential (primary) hypertension: Secondary | ICD-10-CM | POA: Diagnosis not present

## 2016-12-26 DIAGNOSIS — R21 Rash and other nonspecific skin eruption: Secondary | ICD-10-CM | POA: Diagnosis not present

## 2016-12-26 DIAGNOSIS — E7800 Pure hypercholesterolemia, unspecified: Secondary | ICD-10-CM | POA: Diagnosis present

## 2016-12-26 DIAGNOSIS — M8660 Other chronic osteomyelitis, unspecified site: Secondary | ICD-10-CM | POA: Diagnosis not present

## 2016-12-26 DIAGNOSIS — I2510 Atherosclerotic heart disease of native coronary artery without angina pectoris: Secondary | ICD-10-CM | POA: Diagnosis not present

## 2016-12-26 DIAGNOSIS — M84478A Pathological fracture, left toe(s), initial encounter for fracture: Secondary | ICD-10-CM | POA: Diagnosis not present

## 2016-12-28 ENCOUNTER — Ambulatory Visit: Payer: Medicare Other | Admitting: Family Medicine

## 2017-01-02 DIAGNOSIS — I2510 Atherosclerotic heart disease of native coronary artery without angina pectoris: Secondary | ICD-10-CM | POA: Diagnosis not present

## 2017-01-02 DIAGNOSIS — Z951 Presence of aortocoronary bypass graft: Secondary | ICD-10-CM | POA: Diagnosis not present

## 2017-01-02 DIAGNOSIS — Z4781 Encounter for orthopedic aftercare following surgical amputation: Secondary | ICD-10-CM | POA: Diagnosis not present

## 2017-01-02 DIAGNOSIS — I10 Essential (primary) hypertension: Secondary | ICD-10-CM | POA: Diagnosis not present

## 2017-01-02 DIAGNOSIS — Z89412 Acquired absence of left great toe: Secondary | ICD-10-CM | POA: Diagnosis not present

## 2017-01-02 DIAGNOSIS — G629 Polyneuropathy, unspecified: Secondary | ICD-10-CM | POA: Diagnosis not present

## 2017-01-04 DIAGNOSIS — Z951 Presence of aortocoronary bypass graft: Secondary | ICD-10-CM | POA: Diagnosis not present

## 2017-01-04 DIAGNOSIS — G629 Polyneuropathy, unspecified: Secondary | ICD-10-CM | POA: Diagnosis not present

## 2017-01-04 DIAGNOSIS — Z4781 Encounter for orthopedic aftercare following surgical amputation: Secondary | ICD-10-CM | POA: Diagnosis not present

## 2017-01-04 DIAGNOSIS — I10 Essential (primary) hypertension: Secondary | ICD-10-CM | POA: Diagnosis not present

## 2017-01-04 DIAGNOSIS — I2510 Atherosclerotic heart disease of native coronary artery without angina pectoris: Secondary | ICD-10-CM | POA: Diagnosis not present

## 2017-01-04 DIAGNOSIS — Z89412 Acquired absence of left great toe: Secondary | ICD-10-CM | POA: Diagnosis not present

## 2017-01-05 ENCOUNTER — Telehealth: Payer: Self-pay | Admitting: Family Medicine

## 2017-01-05 DIAGNOSIS — G629 Polyneuropathy, unspecified: Secondary | ICD-10-CM | POA: Diagnosis not present

## 2017-01-05 DIAGNOSIS — Z89412 Acquired absence of left great toe: Secondary | ICD-10-CM | POA: Diagnosis not present

## 2017-01-05 DIAGNOSIS — I2510 Atherosclerotic heart disease of native coronary artery without angina pectoris: Secondary | ICD-10-CM | POA: Diagnosis not present

## 2017-01-05 DIAGNOSIS — Z951 Presence of aortocoronary bypass graft: Secondary | ICD-10-CM | POA: Diagnosis not present

## 2017-01-05 DIAGNOSIS — Z4781 Encounter for orthopedic aftercare following surgical amputation: Secondary | ICD-10-CM | POA: Diagnosis not present

## 2017-01-05 DIAGNOSIS — I10 Essential (primary) hypertension: Secondary | ICD-10-CM | POA: Diagnosis not present

## 2017-01-05 NOTE — Telephone Encounter (Signed)
Pt. Called states that he had a toe removed was requesting an order for a wheelchair  Lift. Pt call back # is  [phone]

## 2017-01-05 NOTE — Telephone Encounter (Signed)
I know patient spends part of the year in [place]. I do not see record from local podiatry about his toe surgery.  I am not familiar with this order, I have not written for a "wheelchair lift" before.  Can you clarify with patient how we should handle the order? I would have to use rx pad and handwrite for "Wheel Chair Lift", however he would either need to come by to pick it up from office tomorrow. Or we could fax it somewhere, but we would need specific information.  [name], [facility] 01/05/2017, 4:35 PM

## 2017-01-09 DIAGNOSIS — G629 Polyneuropathy, unspecified: Secondary | ICD-10-CM | POA: Diagnosis not present

## 2017-01-09 DIAGNOSIS — I2510 Atherosclerotic heart disease of native coronary artery without angina pectoris: Secondary | ICD-10-CM | POA: Diagnosis not present

## 2017-01-09 DIAGNOSIS — Z89412 Acquired absence of left great toe: Secondary | ICD-10-CM | POA: Diagnosis not present

## 2017-01-09 DIAGNOSIS — Z951 Presence of aortocoronary bypass graft: Secondary | ICD-10-CM | POA: Diagnosis not present

## 2017-01-09 DIAGNOSIS — Z4781 Encounter for orthopedic aftercare following surgical amputation: Secondary | ICD-10-CM | POA: Diagnosis not present

## 2017-01-09 DIAGNOSIS — I10 Essential (primary) hypertension: Secondary | ICD-10-CM | POA: Diagnosis not present

## 2017-01-11 ENCOUNTER — Ambulatory Visit (INDEPENDENT_AMBULATORY_CARE_PROVIDER_SITE_OTHER): Payer: Medicare Other | Admitting: Family Medicine

## 2017-01-11 ENCOUNTER — Other Ambulatory Visit: Payer: Self-pay | Admitting: Family Medicine

## 2017-01-11 ENCOUNTER — Encounter: Payer: Self-pay | Admitting: Family Medicine

## 2017-01-11 VITALS — BP 115/64 | HR 82 | Temp 98.7°F | Ht 72.0 in | Wt 212.8 lb

## 2017-01-11 DIAGNOSIS — L97529 Non-pressure chronic ulcer of other part of left foot with unspecified severity: Secondary | ICD-10-CM

## 2017-01-11 DIAGNOSIS — D598 Other acquired hemolytic anemias: Secondary | ICD-10-CM

## 2017-01-11 DIAGNOSIS — M869 Osteomyelitis, unspecified: Secondary | ICD-10-CM

## 2017-01-11 DIAGNOSIS — M21372 Foot drop, left foot: Secondary | ICD-10-CM | POA: Insufficient documentation

## 2017-01-11 DIAGNOSIS — R269 Unspecified abnormalities of gait and mobility: Secondary | ICD-10-CM

## 2017-01-11 DIAGNOSIS — H538 Other visual disturbances: Secondary | ICD-10-CM | POA: Diagnosis not present

## 2017-01-11 DIAGNOSIS — I2510 Atherosclerotic heart disease of native coronary artery without angina pectoris: Secondary | ICD-10-CM

## 2017-01-11 DIAGNOSIS — E871 Hypo-osmolality and hyponatremia: Secondary | ICD-10-CM

## 2017-01-11 DIAGNOSIS — M21371 Foot drop, right foot: Secondary | ICD-10-CM | POA: Diagnosis not present

## 2017-01-11 DIAGNOSIS — Z89412 Acquired absence of left great toe: Secondary | ICD-10-CM

## 2017-01-11 DIAGNOSIS — E782 Mixed hyperlipidemia: Secondary | ICD-10-CM

## 2017-01-11 DIAGNOSIS — G609 Hereditary and idiopathic neuropathy, unspecified: Secondary | ICD-10-CM

## 2017-01-11 DIAGNOSIS — I10 Essential (primary) hypertension: Secondary | ICD-10-CM

## 2017-01-11 DIAGNOSIS — R7309 Other abnormal glucose: Secondary | ICD-10-CM

## 2017-01-11 NOTE — Progress Notes (Signed)
Subjective:    Patient ID: [name], male    DOB: 11-04-1935, 81 y.o.   MRN: [id number]  [name] is a 81 y.o. male presenting on 01/11/2017 for Hospitalization Follow-up (Osteomyelitis, AMPUTATION, TOE; METATARSOPHALANGEAL JOINT)   HPI   HOSPITAL FOLLOW-UP VISIT  Hospital/Location: [facility] Date of Admission: 12/26/16 Date of Discharge: 01/01/17 Transition of care call: 01/03/17, Jones Skene RN [facility])  Reason for Admission: Amputation Surgery for L Toe Osteomyelitis  Primary (+Secondary) Diagnosis: L great toe osteomyelitis, s/p amputation MTP  - Hospital H&P and Discharge Summary have been reviewed - Patient presents today about 10 days after recent hospitalization. Brief summary of recent course, patient had symptoms of chronic L toe ulceration non healing and chronic osteomyelitis for months, reviewed prior treatment course including antibiotics with Linezolid PO for up to 6 weeks total, also managed by ID in [place]. In hospital, had MRI confirming osteo extension with plans for amputation per Vascular surgery, s/p surgery on 7/19, bone cultures negative, treated with cefepime/vanc then oral linezolid and DC vanc, transitioned to only Linezolid PO for 10 days, nearly done at this time. Wound care instructions and [place] arranged. - Today reports overall has done well post-op and after discharge. Improving mobility but still significantly limited, he was limited at baseline with known chronic idiopathic neuropathy and foot drop, but now difficulty cannot wear L AFO brace, using walker, improving with HH PT working on exercise program - HH nursing dressing change x 2 weekly, wife able to do daily changes - Asking about wheelchair lift elevator at home, has 2 levels, needs to get access to lower level to pool for PT, requesting medical necessity of elevator for wheelchair lift, see request below - New medications on discharge: Linezolid - Changes to current meds on discharge:  None  CLOUDY / HAZY VISION: - Patient has had gradual worsening chronic problem with cloudy vision, he had been followed by Optometrist, however he and his wife now are planning to see [facility]hthalmologist for further evaluation, he was unsure if any particular diagnosis at this time. He already has an appointment scheduled soon to see Dr Jene Every [facility])  Additional history plans to leave for return to [place] for winter on 04/13/17   Social History  Substance Use Topics  . Smoking status: Never Smoker  . Smokeless tobacco: Never Used  . Alcohol use 0.0 oz/week     Comment: wine in the evening    Review of Systems Per HPI unless specifically indicated above     Objective:    BP 115/64 (BP Location: Right Arm, Patient Position: Sitting, Cuff Size: Normal)   Pulse 82   Temp 98.7 F (37.1 C) (Oral)   Ht 6' (1.829 m)   Wt 212 lb 12.8 oz (96.5 kg)   BMI 28.86 kg/m   Wt Readings from Last 3 Encounters:  01/11/17 212 lb 12.8 oz (96.5 kg)  10/24/16 206 lb (93.4 kg)  10/23/16 210 lb (95.3 kg)    Physical Exam  Constitutional: He is oriented to person, place, and time. He appears well-developed and well-nourished. No distress.  Elderly 81 yr male, currently well appearing, comfortable, cooperative.  Wearing Right lower extremity support AFO for chronic foot drop. Not wearing Left AFO due to post-op shoe.  HENT:  Head: Normocephalic and atraumatic.  Mouth/Throat: Oropharynx is clear and moist.  Eyes: Conjunctivae are normal. Right eye exhibits no discharge. Left eye exhibits no discharge.  Cardiovascular: Normal rate and intact distal pulses.  Pulmonary/Chest: Effort normal.  Musculoskeletal: He exhibits no edema.  Left Foot/Great Toe - Patient defers exam, did not unwrap dressing today, since just recently changed by patient/wife. - S/p L great toe amputation at MTP  Neurological: He is alert and oriented to person, place, and time.  Distal sensation to light touch  reduced in bilateral hands and feet, chronic unchanged  Skin: Skin is warm and dry. No rash noted. He is not diaphoretic. No erythema.  Psychiatric: He has a normal mood and affect. His behavior is normal.  Well groomed, good eye contact, normal speech and thoughts  Nursing note and vitals reviewed.  Results for orders placed or performed in visit on 19/41/74  BASIC METABOLIC PANEL WITH GFR  Result Value Ref Range   Sodium 127 (L) 135 - 146 mmol/L   Potassium 3.5 3.5 - 5.3 mmol/L   Chloride 90 (L) 98 - 110 mmol/L   CO2 28 20 - 31 mmol/L   Glucose, Bld 101 (H) 65 - 99 mg/dL   BUN 14 7 - 25 mg/dL   Creat 0.89 0.70 - 1.18 mg/dL   Calcium 9.3 8.6 - 10.3 mg/dL   GFR, Est African American >89 >=60 mL/min   GFR, Est Non African American 81 >=60 mL/min      Assessment & Plan:   Problem List Items Addressed This Visit    Idiopathic peripheral neuropathy   Relevant Medications   primidone (MYSOLINE) 50 MG tablet   gabapentin (NEURONTIN) 300 MG capsule   Foot drop, bilateral    Stable chronic problem, secondary to chronic idiopathic neuropathy Improved with AFO braces, unable to wear on L side now due to post-op shoe and avoiding strain on L great toe post-op      Cloudy vision    Chronic gradual worsening problem Likely secondary to possible cataracts Agree with patient going to Ophthalmology, he is already scheduled Dr Loma Boston (Ophtho in [place]), our office checked and he does not need new referral for this apt      Amputee, great toe, left ([place])    Improving post-op, still significantly limited ambulation and mobility due to amputation and chronic footdrop/perpipheral neuropathy - Able to ambulate with post-op shoe, walker - Followed by [facility] Podiatry/V[facility] Surgery, has apt Fri 8/3 for wound check then next week for follow-up      Abnormality of gait and mobility    Stable chronic problem, secondary to chronic idiopathic neuropathy Improved with AFO braces, unable to wear on  L side now due to post-op shoe and avoiding strain on L great toe post-op       Other Visit Diagnoses    Osteomyelitis of toe of left foot ([place])    -  Primary   Resolved s/p amputation L great toe, confirmed on MRI, previously treated with Linezolid, finished PO linezolid by 8/3   Relevant Medications   linezolid (ZYVOX) 600 MG tablet   Non-healing ulcer of foot, left, with unspecified severity (HCC)       Resolved s/p amputation, secondary to osteomyelitis      Additionally today requesting rx order for Wheelchair Lift elevator at home, handwritten on rx pad given to patient today  I have reviewed the discharge medication list, and have reconciled the current and discharge medications today.   Current Outpatient Prescriptions:  .  Alpha Lipoic Acid 200 MG CAPS, Take 2 capsules by mouth daily., Disp: , Rfl:  .  amLODipine (NORVASC) 2.5 MG tablet, Take 1 tablet (2.5 mg total) by  mouth daily., Disp: 90 tablet, Rfl: 3 .  aspirin 81 MG tablet, Take 81 mg by mouth daily., Disp: , Rfl:  .  B Complex Vitamins (VITAMIN-B COMPLEX PO), Take 1 tablet by mouth daily. Reported on 10/06/2015, Disp: , Rfl:  .  Biotin 1000 MCG tablet, Take 1,000 mcg by mouth daily., Disp: , Rfl:  .  Calcium Carb-Cholecalciferol 500-100 MG-UNIT CHEW, Chew 500 mg by mouth daily., Disp: , Rfl:  .  cyanocobalamin 1000 MCG tablet, Take 100 mcg by mouth daily., Disp: , Rfl:  .  fluticasone (FLONASE) 50 MCG/ACT nasal spray, Place 2 sprays into both nostrils daily., Disp: 16 g, Rfl: 6 .  folic acid (FOLVITE) 1 MG tablet, Take 1 mg by mouth daily., Disp: , Rfl:  .  furosemide (LASIX) 20 MG tablet, Take 1 tablet (20 mg total) by mouth daily., Disp: 90 tablet, Rfl: 3 .  gabapentin (NEURONTIN) 300 MG capsule, Take 1,200 mg by mouth., Disp: , Rfl:  .  Glucosamine Sulfate 1000 MG CAPS, Take 1 capsule by mouth daily., Disp: , Rfl:  .  hydrochlorothiazide (HYDRODIURIL) 25 MG tablet, Take 1 tablet (25 mg total) by mouth daily., Disp:  90 tablet, Rfl: 2 .  Lecithin 400 MG CAPS, Take 400 mg by mouth daily., Disp: , Rfl:  .  linezolid (ZYVOX) 600 MG tablet, Take 600 mg by mouth., Disp: , Rfl:  .  losartan (COZAAR) 50 MG tablet, Take 1 tablet (50 mg total) by mouth daily., Disp: 90 tablet, Rfl: 3 .  Melatonin 3 MG CAPS, Take 3 mg by mouth at bedtime., Disp: , Rfl:  .  Omega 3 1000 MG CAPS, Take 1,000 mg by mouth daily., Disp: , Rfl:  .  omeprazole (PRILOSEC) 20 MG capsule, Take 20 mg by mouth., Disp: , Rfl:  .  polyethylene glycol powder (GLYCOLAX/MIRALAX) powder, 255 grams one bottle for colonoscopy prep, Disp: 255 g, Rfl: 0 .  pravastatin (PRAVACHOL) 80 MG tablet, Take 1 tablet (80 mg total) by mouth daily., Disp: 90 tablet, Rfl: 3 .  primidone (MYSOLINE) 50 MG tablet, Take 50 mg by mouth., Disp: , Rfl:  .  Selenium 200 MCG CAPS, Take 200 mcg by mouth daily., Disp: , Rfl:  .  tretinoin (RETIN-A) 0.05 % cream, Apply 1 application topically at bedtime., Disp: , Rfl:  .  potassium chloride 20 MEQ TBCR, Take 2 tablets each day (Patient not taking: Reported on 01/11/2017), Disp: 180 tablet, Rfl: 3   Follow up plan: Return in about 2 months (around 03/13/2017) for Medicare Physical CPE Lab review.   Follow-up with Wound Care Friday 8/3 and then following Friday 8/10 for follow-up Vascular  [name], [facility] 01/11/2017, 11:48 PM

## 2017-01-11 NOTE — Assessment & Plan Note (Signed)
Stable chronic problem, secondary to chronic idiopathic neuropathy Improved with AFO braces, unable to wear on L side now due to post-op shoe and avoiding strain on L great toe post-op

## 2017-01-11 NOTE — Assessment & Plan Note (Signed)
Chronic gradual worsening problem Likely secondary to possible cataracts Agree with patient going to Ophthalmology, he is already scheduled Dr Loma Boston (Ophtho in [place]), our office checked and he does not need new referral for this apt

## 2017-01-11 NOTE — Patient Instructions (Addendum)
Thank you for coming to the clinic today.  1. Continue current wound care plan, finish antibiotics as prescribed last dose 01/13/17  2. Continue with Home Health therapy  3. Handwritten rx for "Wheelchair Lift" given today - let us know if we need to arrange anything else  4. Referral will be sent to Ophthalmology as requested - follow-up with them as scheduled  Please schedule a Follow-up Appointment to: Return in about 2 months (around 03/13/2017) for Medicare Physical CPE Lab review.  If you have any other questions or concerns, please feel free to call the clinic or send a message through [facility]. You may also schedule an earlier appointment if necessary.  Additionally, you may be receiving a survey about your experience at our clinic within a few days to 1 week by e-mail or mail. We value your feedback.  [name], DO [place]

## 2017-01-11 NOTE — Assessment & Plan Note (Signed)
Improving post-op, still significantly limited ambulation and mobility due to amputation and chronic footdrop/perpipheral neuropathy - Able to ambulate with post-op shoe, walker - Followed by [facility] Podiatry/Vascular Surgery, has apt Fri 8/3 for wound check then next week for follow-up

## 2017-01-12 DIAGNOSIS — Z951 Presence of aortocoronary bypass graft: Secondary | ICD-10-CM | POA: Diagnosis not present

## 2017-01-12 DIAGNOSIS — I10 Essential (primary) hypertension: Secondary | ICD-10-CM | POA: Diagnosis not present

## 2017-01-12 DIAGNOSIS — Z4781 Encounter for orthopedic aftercare following surgical amputation: Secondary | ICD-10-CM | POA: Diagnosis not present

## 2017-01-12 DIAGNOSIS — Z89412 Acquired absence of left great toe: Secondary | ICD-10-CM | POA: Diagnosis not present

## 2017-01-12 DIAGNOSIS — I2510 Atherosclerotic heart disease of native coronary artery without angina pectoris: Secondary | ICD-10-CM | POA: Diagnosis not present

## 2017-01-12 DIAGNOSIS — G629 Polyneuropathy, unspecified: Secondary | ICD-10-CM | POA: Diagnosis not present

## 2017-01-13 DIAGNOSIS — Z885 Allergy status to narcotic agent status: Secondary | ICD-10-CM | POA: Diagnosis not present

## 2017-01-13 DIAGNOSIS — G629 Polyneuropathy, unspecified: Secondary | ICD-10-CM | POA: Diagnosis not present

## 2017-01-13 DIAGNOSIS — M869 Osteomyelitis, unspecified: Secondary | ICD-10-CM | POA: Diagnosis not present

## 2017-01-13 DIAGNOSIS — Z79899 Other long term (current) drug therapy: Secondary | ICD-10-CM | POA: Diagnosis not present

## 2017-01-13 DIAGNOSIS — R600 Localized edema: Secondary | ICD-10-CM | POA: Diagnosis not present

## 2017-01-13 DIAGNOSIS — Z89412 Acquired absence of left great toe: Secondary | ICD-10-CM | POA: Diagnosis not present

## 2017-01-13 DIAGNOSIS — Z91040 Latex allergy status: Secondary | ICD-10-CM | POA: Diagnosis not present

## 2017-01-13 DIAGNOSIS — K219 Gastro-esophageal reflux disease without esophagitis: Secondary | ICD-10-CM | POA: Diagnosis not present

## 2017-01-13 DIAGNOSIS — E7800 Pure hypercholesterolemia, unspecified: Secondary | ICD-10-CM | POA: Diagnosis not present

## 2017-01-13 DIAGNOSIS — I252 Old myocardial infarction: Secondary | ICD-10-CM | POA: Diagnosis not present

## 2017-01-13 DIAGNOSIS — L97529 Non-pressure chronic ulcer of other part of left foot with unspecified severity: Secondary | ICD-10-CM | POA: Diagnosis not present

## 2017-01-13 DIAGNOSIS — Z888 Allergy status to other drugs, medicaments and biological substances status: Secondary | ICD-10-CM | POA: Diagnosis not present

## 2017-01-13 DIAGNOSIS — Z7982 Long term (current) use of aspirin: Secondary | ICD-10-CM | POA: Diagnosis not present

## 2017-01-16 DIAGNOSIS — Z951 Presence of aortocoronary bypass graft: Secondary | ICD-10-CM | POA: Diagnosis not present

## 2017-01-16 DIAGNOSIS — I2510 Atherosclerotic heart disease of native coronary artery without angina pectoris: Secondary | ICD-10-CM | POA: Diagnosis not present

## 2017-01-16 DIAGNOSIS — Z89412 Acquired absence of left great toe: Secondary | ICD-10-CM | POA: Diagnosis not present

## 2017-01-16 DIAGNOSIS — I10 Essential (primary) hypertension: Secondary | ICD-10-CM | POA: Diagnosis not present

## 2017-01-16 DIAGNOSIS — Z4781 Encounter for orthopedic aftercare following surgical amputation: Secondary | ICD-10-CM | POA: Diagnosis not present

## 2017-01-16 DIAGNOSIS — G629 Polyneuropathy, unspecified: Secondary | ICD-10-CM | POA: Diagnosis not present

## 2017-01-18 DIAGNOSIS — G629 Polyneuropathy, unspecified: Secondary | ICD-10-CM | POA: Diagnosis not present

## 2017-01-18 DIAGNOSIS — I10 Essential (primary) hypertension: Secondary | ICD-10-CM | POA: Diagnosis not present

## 2017-01-18 DIAGNOSIS — Z951 Presence of aortocoronary bypass graft: Secondary | ICD-10-CM | POA: Diagnosis not present

## 2017-01-18 DIAGNOSIS — Z89412 Acquired absence of left great toe: Secondary | ICD-10-CM | POA: Diagnosis not present

## 2017-01-18 DIAGNOSIS — I2510 Atherosclerotic heart disease of native coronary artery without angina pectoris: Secondary | ICD-10-CM | POA: Diagnosis not present

## 2017-01-18 DIAGNOSIS — Z4781 Encounter for orthopedic aftercare following surgical amputation: Secondary | ICD-10-CM | POA: Diagnosis not present

## 2017-01-19 DIAGNOSIS — H35033 Hypertensive retinopathy, bilateral: Secondary | ICD-10-CM | POA: Diagnosis not present

## 2017-01-19 DIAGNOSIS — H25093 Other age-related incipient cataract, bilateral: Secondary | ICD-10-CM | POA: Diagnosis not present

## 2017-01-20 DIAGNOSIS — I2510 Atherosclerotic heart disease of native coronary artery without angina pectoris: Secondary | ICD-10-CM | POA: Diagnosis not present

## 2017-01-20 DIAGNOSIS — M21371 Foot drop, right foot: Secondary | ICD-10-CM | POA: Diagnosis not present

## 2017-01-20 DIAGNOSIS — Z89421 Acquired absence of other right toe(s): Secondary | ICD-10-CM | POA: Diagnosis not present

## 2017-01-20 DIAGNOSIS — Z89429 Acquired absence of other toe(s), unspecified side: Secondary | ICD-10-CM | POA: Diagnosis not present

## 2017-01-20 DIAGNOSIS — Z89412 Acquired absence of left great toe: Secondary | ICD-10-CM | POA: Diagnosis not present

## 2017-01-20 DIAGNOSIS — M869 Osteomyelitis, unspecified: Secondary | ICD-10-CM | POA: Diagnosis not present

## 2017-01-20 DIAGNOSIS — G6289 Other specified polyneuropathies: Secondary | ICD-10-CM | POA: Diagnosis not present

## 2017-01-20 DIAGNOSIS — G629 Polyneuropathy, unspecified: Secondary | ICD-10-CM | POA: Diagnosis not present

## 2017-01-20 DIAGNOSIS — M21372 Foot drop, left foot: Secondary | ICD-10-CM | POA: Diagnosis not present

## 2017-02-03 ENCOUNTER — Other Ambulatory Visit: Payer: Self-pay

## 2017-02-03 DIAGNOSIS — I10 Essential (primary) hypertension: Secondary | ICD-10-CM

## 2017-02-03 MED ORDER — HYDROCHLOROTHIAZIDE 25 MG PO TABS: 25.0000 mg | ORAL_TABLET | Freq: Every day | ORAL | 3 refills | Status: DC

## 2017-02-23 DIAGNOSIS — G629 Polyneuropathy, unspecified: Secondary | ICD-10-CM | POA: Diagnosis not present

## 2017-02-23 DIAGNOSIS — M79674 Pain in right toe(s): Secondary | ICD-10-CM | POA: Diagnosis not present

## 2017-02-23 DIAGNOSIS — Z89412 Acquired absence of left great toe: Secondary | ICD-10-CM | POA: Diagnosis not present

## 2017-02-23 DIAGNOSIS — M79675 Pain in left toe(s): Secondary | ICD-10-CM | POA: Diagnosis not present

## 2017-02-23 DIAGNOSIS — I2510 Atherosclerotic heart disease of native coronary artery without angina pectoris: Secondary | ICD-10-CM | POA: Diagnosis not present

## 2017-02-23 DIAGNOSIS — L539 Erythematous condition, unspecified: Secondary | ICD-10-CM | POA: Diagnosis not present

## 2017-02-23 DIAGNOSIS — Z89422 Acquired absence of other left toe(s): Secondary | ICD-10-CM | POA: Diagnosis not present

## 2017-02-23 DIAGNOSIS — G6289 Other specified polyneuropathies: Secondary | ICD-10-CM | POA: Diagnosis not present

## 2017-02-23 DIAGNOSIS — B351 Tinea unguium: Secondary | ICD-10-CM | POA: Diagnosis not present

## 2017-02-28 ENCOUNTER — Ambulatory Visit (INDEPENDENT_AMBULATORY_CARE_PROVIDER_SITE_OTHER): Payer: Medicare Other

## 2017-02-28 DIAGNOSIS — Z23 Encounter for immunization: Secondary | ICD-10-CM | POA: Diagnosis not present

## 2017-03-21 DIAGNOSIS — E782 Mixed hyperlipidemia: Secondary | ICD-10-CM | POA: Diagnosis not present

## 2017-03-21 DIAGNOSIS — I10 Essential (primary) hypertension: Secondary | ICD-10-CM | POA: Diagnosis not present

## 2017-03-21 DIAGNOSIS — I5022 Chronic systolic (congestive) heart failure: Secondary | ICD-10-CM | POA: Diagnosis not present

## 2017-03-21 DIAGNOSIS — I2510 Atherosclerotic heart disease of native coronary artery without angina pectoris: Secondary | ICD-10-CM | POA: Diagnosis not present

## 2017-03-23 ENCOUNTER — Other Ambulatory Visit: Payer: Medicare Other

## 2017-03-23 DIAGNOSIS — I10 Essential (primary) hypertension: Secondary | ICD-10-CM | POA: Diagnosis not present

## 2017-03-23 DIAGNOSIS — E782 Mixed hyperlipidemia: Secondary | ICD-10-CM

## 2017-03-23 DIAGNOSIS — R7309 Other abnormal glucose: Secondary | ICD-10-CM

## 2017-03-23 DIAGNOSIS — L97529 Non-pressure chronic ulcer of other part of left foot with unspecified severity: Secondary | ICD-10-CM | POA: Diagnosis not present

## 2017-03-23 DIAGNOSIS — G6289 Other specified polyneuropathies: Secondary | ICD-10-CM | POA: Diagnosis not present

## 2017-03-23 DIAGNOSIS — E871 Hypo-osmolality and hyponatremia: Secondary | ICD-10-CM

## 2017-03-23 DIAGNOSIS — D598 Other acquired hemolytic anemias: Secondary | ICD-10-CM

## 2017-03-23 DIAGNOSIS — D539 Nutritional anemia, unspecified: Secondary | ICD-10-CM | POA: Diagnosis not present

## 2017-03-27 ENCOUNTER — Telehealth: Payer: Self-pay | Admitting: Family Medicine

## 2017-03-27 LAB — COMPLETE METABOLIC PANEL WITH GFR
AG RATIO: 1.6 (calc) (ref 1.0–2.5)
ALT: 41 U/L (ref 9–46)
AST: 65 U/L — ABNORMAL HIGH (ref 10–35)
Albumin: 4.2 g/dL (ref 3.6–5.1)
Alkaline phosphatase (APISO): 88 U/L (ref 40–115)
BUN: 13 mg/dL (ref 7–25)
CALCIUM: 9.6 mg/dL (ref 8.6–10.3)
CO2: 28 mmol/L (ref 20–32)
Chloride: 94 mmol/L — ABNORMAL LOW (ref 98–110)
Creat: 0.79 mg/dL (ref 0.70–1.18)
GFR, EST AFRICAN AMERICAN: 99 mL/min/{1.73_m2} (ref >=60)
GFR, EST NON AFRICAN AMERICAN: 85 mL/min/{1.73_m2} (ref >=60)
GLUCOSE: 101 mg/dL — AB (ref 65–99)
Globulin: 2.7 g/dL (calc) (ref 1.9–3.7)
Potassium: 3.5 mmol/L (ref 3.5–5.3)
Sodium: 134 mmol/L — ABNORMAL LOW (ref 135–146)
TOTAL PROTEIN: 6.9 g/dL (ref 6.1–8.1)
Total Bilirubin: 0.8 mg/dL (ref 0.2–1.2)

## 2017-03-27 LAB — HEMOGLOBIN A1C
HEMOGLOBIN A1C: 4.8 %{Hb} (ref <5.7)
Mean Plasma Glucose: 91 (calc)
eAG (mmol/L): 5 (calc)

## 2017-03-27 LAB — LIPID PANEL
CHOL/HDL RATIO: 1.9 (calc) (ref <5.0)
Cholesterol: 171 mg/dL (ref <200)
HDL: 92 mg/dL (ref >40)
LDL CHOLESTEROL (CALC): 55 mg/dL
NON-HDL CHOLESTEROL (CALC): 79 mg/dL (ref <130)
TRIGLYCERIDES: 158 mg/dL — AB (ref <150)

## 2017-03-27 LAB — TEST AUTHORIZATION

## 2017-03-27 LAB — CBC WITH DIFFERENTIAL/PLATELET
Basophils Absolute: 28 cells/uL (ref 0–200)
Basophils Relative: 0.6 %
EOS PCT: 2.4 %
Eosinophils Absolute: 110 cells/uL (ref 15–500)
HCT: 36.9 % — ABNORMAL LOW (ref 38.5–50.0)
Hemoglobin: 12.5 g/dL — ABNORMAL LOW (ref 13.2–17.1)
Lymphs Abs: 1352 cells/uL (ref 850–3900)
MCH: 35.9 pg — ABNORMAL HIGH (ref 27.0–33.0)
MCHC: 33.9 g/dL (ref 32.0–36.0)
MCV: 106 fL — AB (ref 80.0–100.0)
MPV: 9.1 fL (ref 7.5–12.5)
Monocytes Relative: 8.4 %
NEUTROS PCT: 59.2 %
Neutro Abs: 2723 cells/uL (ref 1500–7800)
PLATELETS: 237 10*3/uL (ref 140–400)
RBC: 3.48 10*6/uL — AB (ref 4.20–5.80)
RDW: 13.1 % (ref 11.0–15.0)
TOTAL LYMPHOCYTE: 29.4 %
WBC mixed population: 386 cells/uL (ref 200–950)
WBC: 4.6 10*3/uL (ref 3.8–10.8)

## 2017-03-27 LAB — B12 AND FOLATE PANEL
Folate: >24 ng/mL
VITAMIN B 12: 1875 pg/mL — AB (ref 200–1100)

## 2017-03-27 NOTE — Telephone Encounter (Signed)
[name] at [facility] said tests were added

## 2017-03-28 ENCOUNTER — Other Ambulatory Visit: Payer: Self-pay | Admitting: Family Medicine

## 2017-03-28 ENCOUNTER — Encounter: Payer: Self-pay | Admitting: Family Medicine

## 2017-03-28 ENCOUNTER — Ambulatory Visit (INDEPENDENT_AMBULATORY_CARE_PROVIDER_SITE_OTHER): Payer: Medicare Other | Admitting: Family Medicine

## 2017-03-28 VITALS — BP 128/70 | HR 87 | Temp 98.4°F | Wt 206.4 lb

## 2017-03-28 DIAGNOSIS — E782 Mixed hyperlipidemia: Secondary | ICD-10-CM | POA: Diagnosis not present

## 2017-03-28 DIAGNOSIS — I5022 Chronic systolic (congestive) heart failure: Secondary | ICD-10-CM | POA: Diagnosis not present

## 2017-03-28 DIAGNOSIS — E871 Hypo-osmolality and hyponatremia: Secondary | ICD-10-CM | POA: Insufficient documentation

## 2017-03-28 DIAGNOSIS — I25810 Atherosclerosis of coronary artery bypass graft(s) without angina pectoris: Secondary | ICD-10-CM | POA: Diagnosis not present

## 2017-03-28 DIAGNOSIS — Z89412 Acquired absence of left great toe: Secondary | ICD-10-CM

## 2017-03-28 DIAGNOSIS — I10 Essential (primary) hypertension: Secondary | ICD-10-CM | POA: Diagnosis not present

## 2017-03-28 DIAGNOSIS — D539 Nutritional anemia, unspecified: Secondary | ICD-10-CM

## 2017-03-28 DIAGNOSIS — R799 Abnormal finding of blood chemistry, unspecified: Secondary | ICD-10-CM

## 2017-03-28 NOTE — Patient Instructions (Addendum)
Thank you for coming to the clinic today.  1.  Good results overall with labs  - Sugar is excellent A1c, no where near pre-diabetes. Do not worry about glucose 101 - Mildly abnormal liver function test, we will re-check in 6 months - Anemia is about the same as it has been, no significant change, normal H20 and Folic Acid, keep taking meds, we can re-check blood count in 6 months again - Cholesterol improved, can wait 1 year to re-check  Keep taking Potassium 20 mEq once daily, especially if taking fluid pill lasix daily  3.  DUE for FASTING BLOOD WORK (no food or drink after midnight before the lab appointment, only water or coffee without cream/sugar on the morning of)  SCHEDULE "Lab Only" visit in the morning at the clinic for lab draw in 6 MONTHS  - Make sure Lab Only appointment is at about 1 week before your next appointment, so that results will be available  For Lab Results, once available within 2-3 days of blood draw, you can can log in to MyChart online to view your results and a brief explanation. Also, we can discuss results at next follow-up visit.  Please schedule a Follow-up Appointment to: Return in about 6 months (around 09/26/2017) for Lab review, HTN, Anemia.  If you have any other questions or concerns, please feel free to call the clinic or send a message through [facility]. You may also schedule an earlier appointment if necessary.  Additionally, you may be receiving a survey about your experience at our clinic within a few days to 1 week by e-mail or mail. We value your feedback.  [name], DO [place]

## 2017-03-28 NOTE — Assessment & Plan Note (Signed)
Stable, without anginal symptoms On med management s/p CABG 1987 Followed by [facility] Cardiology Dr Nehemiah Massed, last apt 03/2017

## 2017-03-28 NOTE — Assessment & Plan Note (Signed)
Controlled cholesterol on statin and lifestyle Last lipid panel 03/2017 Known CAD s/p CABG  Plan: 1. Continue current meds - Pravastatin 80mg  daily 2. Continue ASA 81mg  for secondary ASCVD risk reduction 3. Encourage improved lifestyle - low carb/cholesterol, reduce portion size, continue improving regular exercise 4. Follow-up q 6 mo - yearly lipids

## 2017-03-28 NOTE — Assessment & Plan Note (Signed)
Improved on most recent check Na 134, previous range stable 127s Remains on HCTZ and lasix Continue improved fluid intake Follow-up as planned q 6 mo chemistry

## 2017-03-28 NOTE — Assessment & Plan Note (Signed)
Stable chronic CHF, remains euvolemic Continues med management for fluid with Lasix 20mg daily, and K supplement On ARB Followed by Cardiology 

## 2017-03-28 NOTE — Progress Notes (Signed)
Subjective:    Patient ID: [name], male    DOB: 11-20-1935, 81 y.o.   MRN: [id number]  [name] is a 81 y.o. male presenting on 03/28/2017 for Hypertension (follow up test result)   HPI   Here for Annual Visit and Lab Review:  Specialists: Cardiology - Dr Serafina Royals [facility] Cardiology) Podiatry - Dr Buddy Duty [facility] Podiatry)  CHRONIC HYPONATREMIA: - Last visit with me for same problem 10/24/16, for hospital follow-up, have monitored Na lab since then, with chronic low trend approx 127, treated with monitoring and improve fluid intake to avoid hypovolemia as well, considered hold HCTZ, see prior notes for background information. - Interval update with interval re-check lab with normalized or improved Na 134, no further symptoms from hyponatremia or other concerns - Today patient reports doing well. Pleased with improved sodium - Continues to take HCTZ 25mg  daily - Other electrolyte imbalance with concern low K, he was taking K supplement then stopped for while, now resumed Potassium 49mEq daily, and he continues to take Lasix 20mg  daily  ELEVATED AST: - New problem identified on lab results, no significant history of abnormal LFTs, seems isolated problem. No known other related history, history of normal ALT, reviewed labs back to 2016.  Systolic CHF / CAD - Followed by [facility] Cardiology Dr Nehemiah Massed, last apt 03/21/17, reviewed chart, was given "good report", has continued current medications for CHF including lasix, and K supplement, controlled HTN and continues to monitor CAD on meds with ARB, Statin, ASA  Chronic Polyneuropathy / S/p L toe amputation - Followed by Podiatry, seems to improve now completed PT, has some difficulty still with L foot, improving still - Limited walking outdoors, but doing more physical therapy exercises in bed, limited ambulation  CHRONIC ANEMIA, Macrocytic - Reports history of anemia for many years, chart review back to 2013, with similar  results, history of elevated MCV, patient is unsure about type of anemia. He states was referred to Hematologist at one point and they did extensive work-up including monitor levels for several months without significant change, no further treatment recommended - Additional labs with Vitamin B12 and Folate that were normal, he continues to take Vitamin B12 and Folate  HYPERLIPIDEMIA: - Reports no concerns. Last lipid panel 03/2017, controlled  - Currently taking Pravastatin 80mg , tolerating well without side effects or myalgias   Health Maintenance: - UTD Flu Shot this season 02/28/17 - UTD Pneumonia Vaccines, TDap  Depression screen [facility] 2/9 03/28/2017 09/23/2016 11/03/2015  Decreased Interest 0 0 0  Down, Depressed, Hopeless 0 0 0  PHQ - 2 Score 0 0 0    Social History  Substance Use Topics  . Smoking status: Never Smoker  . Smokeless tobacco: Never Used  . Alcohol use 0.0 oz/week     Comment: wine in the evening    Review of Systems  Constitutional: Negative for activity change, appetite change, chills, diaphoresis, fatigue, fever and unexpected weight change.  HENT: Negative for congestion and hearing loss.   Eyes: Negative for visual disturbance.  Respiratory: Negative for apnea, cough, chest tightness, shortness of breath and wheezing.   Cardiovascular: Negative for chest pain, palpitations and leg swelling.  Gastrointestinal: Negative for abdominal pain, anal bleeding, blood in stool, constipation, diarrhea, nausea and vomiting.  Endocrine: Negative for cold intolerance.  Genitourinary: Negative for dysuria, frequency and hematuria.  Musculoskeletal: Positive for arthralgias and gait problem. Negative for neck pain.  Skin: Negative for rash.  Allergic/Immunologic: Negative for environmental allergies.  Neurological:  Negative for dizziness, weakness, light-headedness, numbness and headaches.  Hematological: Negative for adenopathy.  Psychiatric/Behavioral: Negative for  behavioral problems, dysphoric mood and sleep disturbance. The patient is not nervous/anxious.    Per HPI unless specifically indicated above     Objective:    BP 128/70   Pulse 87   Temp 98.4 F (36.9 C) (Oral)   Wt 206 lb 6.4 oz (93.6 kg)   BMI 27.99 kg/m   Wt Readings from Last 3 Encounters:  03/28/17 206 lb 6.4 oz (93.6 kg)  01/11/17 212 lb 12.8 oz (96.5 kg)  10/24/16 206 lb (93.4 kg)    Physical Exam  Constitutional: He is oriented to person, place, and time. He appears well-developed and well-nourished. No distress.  Elderly 81 yr male, currently well appearing, comfortable, cooperative.  Wearing b/l lower extremity support AFO for chronic foot drop. Has cane  HENT:  Head: Normocephalic and atraumatic.  Mouth/Throat: Oropharynx is clear and moist.  Frontal / maxillary sinuses non-tender. Nares patent without purulence or edema. Oropharynx clear without erythema, exudates, edema or asymmetry.  Hearing aid in place  Eyes: Conjunctivae are normal. Right eye exhibits no discharge. Left eye exhibits no discharge.  Neck: Normal range of motion. Neck supple.  Cardiovascular: Normal rate, regular rhythm, normal heart sounds and intact distal pulses.   No murmur heard. Pulmonary/Chest: Effort normal and breath sounds normal. No respiratory distress. He has no wheezes. He has no rales.  Musculoskeletal: He exhibits no edema.  - S/p L great toe amputation at MTP  Neurological: He is alert and oriented to person, place, and time.  Distal sensation to light touch reduced in bilateral hands and feet, chronic unchanged  Skin: Skin is warm and dry. No rash noted. He is not diaphoretic. No erythema.  Psychiatric: He has a normal mood and affect. His behavior is normal.  Well groomed, good eye contact, normal speech and thoughts  Nursing note and vitals reviewed.  Results for orders placed or performed in visit on 03/23/17  COMPLETE METABOLIC PANEL WITH GFR  Result Value Ref Range     Glucose, Bld 101 (H) 65 - 99 mg/dL   BUN 13 7 - 25 mg/dL   Creat 0.79 0.70 - 1.18 mg/dL   GFR, Est Non African American 85 > OR = 60 mL/min/1.37m2   GFR, Est African American 99 > OR = 60 mL/min/1.57m2   BUN/Creatinine Ratio NOT APPLICABLE 6 - 22 (calc)   Sodium 134 (L) 135 - 146 mmol/L   Potassium 3.5 3.5 - 5.3 mmol/L   Chloride 94 (L) 98 - 110 mmol/L   CO2 28 20 - 32 mmol/L   Calcium 9.6 8.6 - 10.3 mg/dL   Total Protein 6.9 6.1 - 8.1 g/dL   Albumin 4.2 3.6 - 5.1 g/dL   Globulin 2.7 1.9 - 3.7 g/dL (calc)   AG Ratio 1.6 1.0 - 2.5 (calc)   Total Bilirubin 0.8 0.2 - 1.2 mg/dL   Alkaline phosphatase (APISO) 88 40 - 115 U/L   AST 65 (H) 10 - 35 U/L   ALT 41 9 - 46 U/L  Lipid panel  Result Value Ref Range   Cholesterol 171 <200 mg/dL   HDL 92 >40 mg/dL   Triglycerides 158 (H) <150 mg/dL   LDL Cholesterol (Calc) 55 mg/dL (calc)   Total CHOL/HDL Ratio 1.9 <5.0 (calc)   Non-HDL Cholesterol (Calc) 79 <130 mg/dL (calc)  CBC with Differential/Platelet  Result Value Ref Range   WBC 4.6 3.8 - 10.8  Thousand/uL   RBC 3.48 (L) 4.20 - 5.80 Million/uL   Hemoglobin 12.5 (L) 13.2 - 17.1 g/dL   HCT 36.9 (L) 38.5 - 50.0 %   MCV 106.0 (H) 80.0 - 100.0 fL   MCH 35.9 (H) 27.0 - 33.0 pg   MCHC 33.9 32.0 - 36.0 g/dL   RDW 13.1 11.0 - 15.0 %   Platelets 237 140 - 400 Thousand/uL   MPV 9.1 7.5 - 12.5 fL   Neutro Abs 2,723 1,500 - 7,800 cells/uL   Lymphs Abs 1,352 850 - 3,900 cells/uL   WBC mixed population 386 200 - 950 cells/uL   Eosinophils Absolute 110 15 - 500 cells/uL   Basophils Absolute 28 0 - 200 cells/uL   Neutrophils Relative % 59.2 %   Total Lymphocyte 29.4 %   Monocytes Relative 8.4 %   Eosinophils Relative 2.4 %   Basophils Relative 0.6 %  Hemoglobin A1c  Result Value Ref Range   Hgb A1c MFr Bld 4.8 <5.7 % of total Hgb   Mean Plasma Glucose 91 (calc)   eAG (mmol/L) 5.0 (calc)  B12 and Folate Panel  Result Value Ref Range   Vitamin B-12 1,875 (H) 200 - 1,100 pg/mL   Folate  >24.0 ng/mL  TEST AUTHORIZATION  Result Value Ref Range   TEST NAME: VITAMIN B12/FOLATE,    TEST CODE: [id number]    CLIENT CONTACT: ALEX Camree [place]    REPORT ALWAYS MESSAGE SIGNATURE        Assessment & Plan:   Problem List Items Addressed This Visit    Amputee, great toe, left ([place])    Still improving post-op, has some limitation with L foot, complicated by polyneuropathy Continue follow-up with [facility] / Vascular Continues AFO bilateral braces      CAD (coronary artery disease)    Stable, without anginal symptoms On med management s/p CABG 1987 Followed by [facility] Cardiology Dr Nehemiah Massed, last apt 03/2017      Chronic hyponatremia - Primary    Improved on most recent check Na 134, previous range stable 127s Remains on HCTZ and lasix Continue improved fluid intake Follow-up as planned q 6 mo chemistry      Chronic systolic CHF (congestive heart failure), NYHA class 2 (HCC)    Stable chronic CHF, remains euvolemic Continues med management for fluid with Lasix 20mg  daily, and K supplement On ARB Followed by Cardiology      Essential hypertension    Well-controlled HTN - Home BP readings normal  No known complications  Plan:  1. Continue current BP regimen  2. Encourage improved lifestyle - low sodium diet, regular exercise 3. Continue monitor BP outside office, bring readings to next visit, if persistently >140/90 or new symptoms notify office sooner 4. Follow-up q 6 mo      Hyperlipidemia, mixed    Controlled cholesterol on statin and lifestyle Last lipid panel 03/2017 Known CAD s/p CABG  Plan: 1. Continue current meds - Pravastatin 80mg  daily 2. Continue ASA 81mg  for secondary ASCVD risk reduction 3. Encourage improved lifestyle - low carb/cholesterol, reduce portion size, continue improving regular exercise 4. Follow-up q 6 mo - yearly lipids      Macrocytic anemia    Uncertain exact etiology, chronic problem, seems stable from prior lab review to  2013 No results for iron panel, additional labs Vitamin B12 (elevated) and Folate normal - Continues on Folic acid and Vitamin B12, consider reduce B12 in future - Follow-up CBC trend Hgb next q 6 mo with  added iron and ferritin TIBC, may consider further work-up or return to Heme if need         No orders of the defined types were placed in this encounter.   Follow up plan: Return in about 6 months (around 09/26/2017) for Lab review, HTN, Anemia.  Note patient travels to [place] with wife for 6 months out of the year, they will leave 04/13/17 and return 09/19/17. He understands limitations in medical care with being out of state for 6 months. He has established physicians down in [place] for interval care. He plans to resume follow-up in 6 months with me 09/2017, future labs ordered.  [name], DO [facility] 03/28/2017, 10:42 PM

## 2017-03-28 NOTE — Assessment & Plan Note (Signed)
Still improving post-op, has some limitation with L foot, complicated by polyneuropathy Continue follow-up with [facility] / Vascular Continues AFO bilateral braces

## 2017-03-28 NOTE — Assessment & Plan Note (Signed)
Well-controlled HTN - Home BP readings normal  No known complications  Plan:  1. Continue current BP regimen  2. Encourage improved lifestyle - low sodium diet, regular exercise 3. Continue monitor BP outside office, bring readings to next visit, if persistently >140/90 or new symptoms notify office sooner 4. Follow-up q 6 mo

## 2017-03-28 NOTE — Assessment & Plan Note (Signed)
Uncertain exact etiology, chronic problem, seems stable from prior lab review to 2013 No results for iron panel, additional labs Vitamin B12 (elevated) and Folate normal - Continues on Folic acid and Vitamin B12, consider reduce B12 in future - Follow-up CBC trend Hgb next q 6 mo with added iron and ferritin TIBC, may consider further work-up or return to Heme if need

## 2017-03-31 DIAGNOSIS — L97529 Non-pressure chronic ulcer of other part of left foot with unspecified severity: Secondary | ICD-10-CM | POA: Diagnosis not present

## 2017-04-03 DIAGNOSIS — L821 Other seborrheic keratosis: Secondary | ICD-10-CM | POA: Diagnosis not present

## 2017-04-03 DIAGNOSIS — B356 Tinea cruris: Secondary | ICD-10-CM | POA: Diagnosis not present

## 2017-04-03 DIAGNOSIS — L570 Actinic keratosis: Secondary | ICD-10-CM | POA: Diagnosis not present

## 2017-04-03 DIAGNOSIS — L820 Inflamed seborrheic keratosis: Secondary | ICD-10-CM | POA: Diagnosis not present

## 2017-04-03 DIAGNOSIS — Z89412 Acquired absence of left great toe: Secondary | ICD-10-CM | POA: Diagnosis not present

## 2017-04-03 DIAGNOSIS — Z85828 Personal history of other malignant neoplasm of skin: Secondary | ICD-10-CM | POA: Diagnosis not present

## 2017-04-03 DIAGNOSIS — D0421 Carcinoma in situ of skin of right ear and external auricular canal: Secondary | ICD-10-CM | POA: Diagnosis not present

## 2017-04-03 DIAGNOSIS — L97529 Non-pressure chronic ulcer of other part of left foot with unspecified severity: Secondary | ICD-10-CM | POA: Diagnosis not present

## 2017-04-10 DIAGNOSIS — L97529 Non-pressure chronic ulcer of other part of left foot with unspecified severity: Secondary | ICD-10-CM | POA: Diagnosis not present

## 2017-04-10 DIAGNOSIS — L602 Onychogryphosis: Secondary | ICD-10-CM | POA: Diagnosis not present

## 2017-04-10 DIAGNOSIS — L603 Nail dystrophy: Secondary | ICD-10-CM | POA: Diagnosis not present

## 2017-04-26 DIAGNOSIS — I10 Essential (primary) hypertension: Secondary | ICD-10-CM | POA: Diagnosis not present

## 2017-04-26 DIAGNOSIS — C4492 Squamous cell carcinoma of skin, unspecified: Secondary | ICD-10-CM | POA: Diagnosis not present

## 2017-04-26 DIAGNOSIS — Z6827 Body mass index (BMI) 27.0-27.9, adult: Secondary | ICD-10-CM | POA: Diagnosis not present

## 2017-04-26 DIAGNOSIS — M86172 Other acute osteomyelitis, left ankle and foot: Secondary | ICD-10-CM | POA: Diagnosis not present

## 2017-04-26 DIAGNOSIS — B356 Tinea cruris: Secondary | ICD-10-CM | POA: Diagnosis not present

## 2017-04-26 DIAGNOSIS — G609 Hereditary and idiopathic neuropathy, unspecified: Secondary | ICD-10-CM | POA: Diagnosis not present

## 2017-05-01 DIAGNOSIS — L02612 Cutaneous abscess of left foot: Secondary | ICD-10-CM | POA: Diagnosis not present

## 2017-05-01 DIAGNOSIS — L97822 Non-pressure chronic ulcer of other part of left lower leg with fat layer exposed: Secondary | ICD-10-CM | POA: Diagnosis not present

## 2017-05-01 DIAGNOSIS — L600 Ingrowing nail: Secondary | ICD-10-CM | POA: Diagnosis not present

## 2017-05-01 DIAGNOSIS — A488 Other specified bacterial diseases: Secondary | ICD-10-CM | POA: Diagnosis not present

## 2017-05-10 DIAGNOSIS — L600 Ingrowing nail: Secondary | ICD-10-CM | POA: Diagnosis not present

## 2017-05-10 DIAGNOSIS — L97822 Non-pressure chronic ulcer of other part of left lower leg with fat layer exposed: Secondary | ICD-10-CM | POA: Diagnosis not present

## 2017-05-17 DIAGNOSIS — L814 Other melanin hyperpigmentation: Secondary | ICD-10-CM | POA: Diagnosis not present

## 2017-05-17 DIAGNOSIS — D485 Neoplasm of uncertain behavior of skin: Secondary | ICD-10-CM | POA: Diagnosis not present

## 2017-05-24 DIAGNOSIS — L97822 Non-pressure chronic ulcer of other part of left lower leg with fat layer exposed: Secondary | ICD-10-CM | POA: Diagnosis not present

## 2017-06-01 DIAGNOSIS — L97822 Non-pressure chronic ulcer of other part of left lower leg with fat layer exposed: Secondary | ICD-10-CM | POA: Diagnosis not present

## 2017-06-12 DIAGNOSIS — L97822 Non-pressure chronic ulcer of other part of left lower leg with fat layer exposed: Secondary | ICD-10-CM | POA: Diagnosis not present

## 2017-06-21 DIAGNOSIS — L97822 Non-pressure chronic ulcer of other part of left lower leg with fat layer exposed: Secondary | ICD-10-CM | POA: Diagnosis not present

## 2017-07-17 DIAGNOSIS — L820 Inflamed seborrheic keratosis: Secondary | ICD-10-CM | POA: Diagnosis not present

## 2017-07-17 DIAGNOSIS — C44222 Squamous cell carcinoma of skin of right ear and external auricular canal: Secondary | ICD-10-CM | POA: Diagnosis not present

## 2017-07-17 DIAGNOSIS — D492 Neoplasm of unspecified behavior of bone, soft tissue, and skin: Secondary | ICD-10-CM | POA: Diagnosis not present

## 2017-07-18 ENCOUNTER — Other Ambulatory Visit: Payer: Self-pay | Admitting: Family Medicine

## 2017-07-18 DIAGNOSIS — G250 Essential tremor: Secondary | ICD-10-CM

## 2017-07-18 DIAGNOSIS — G609 Hereditary and idiopathic neuropathy, unspecified: Secondary | ICD-10-CM

## 2017-07-18 DIAGNOSIS — K219 Gastro-esophageal reflux disease without esophagitis: Secondary | ICD-10-CM

## 2017-07-18 DIAGNOSIS — I10 Essential (primary) hypertension: Secondary | ICD-10-CM

## 2017-07-18 DIAGNOSIS — E782 Mixed hyperlipidemia: Secondary | ICD-10-CM

## 2017-07-19 DIAGNOSIS — L97822 Non-pressure chronic ulcer of other part of left lower leg with fat layer exposed: Secondary | ICD-10-CM | POA: Diagnosis not present

## 2017-07-25 DIAGNOSIS — Z6827 Body mass index (BMI) 27.0-27.9, adult: Secondary | ICD-10-CM | POA: Diagnosis not present

## 2017-07-25 DIAGNOSIS — G609 Hereditary and idiopathic neuropathy, unspecified: Secondary | ICD-10-CM | POA: Diagnosis not present

## 2017-07-25 DIAGNOSIS — Z23 Encounter for immunization: Secondary | ICD-10-CM | POA: Diagnosis not present

## 2017-07-25 DIAGNOSIS — I509 Heart failure, unspecified: Secondary | ICD-10-CM | POA: Diagnosis not present

## 2017-07-25 DIAGNOSIS — I10 Essential (primary) hypertension: Secondary | ICD-10-CM | POA: Diagnosis not present

## 2017-08-22 ENCOUNTER — Other Ambulatory Visit: Payer: Self-pay

## 2017-08-22 DIAGNOSIS — I10 Essential (primary) hypertension: Secondary | ICD-10-CM

## 2017-08-22 MED ORDER — HYDROCHLOROTHIAZIDE 25 MG PO TABS: 25.0000 mg | ORAL_TABLET | Freq: Every day | ORAL | 3 refills | Status: DC

## 2017-09-06 DIAGNOSIS — L97822 Non-pressure chronic ulcer of other part of left lower leg with fat layer exposed: Secondary | ICD-10-CM | POA: Diagnosis not present

## 2017-09-25 DIAGNOSIS — D485 Neoplasm of uncertain behavior of skin: Secondary | ICD-10-CM | POA: Diagnosis not present

## 2017-09-25 DIAGNOSIS — D225 Melanocytic nevi of trunk: Secondary | ICD-10-CM | POA: Diagnosis not present

## 2017-09-25 DIAGNOSIS — L821 Other seborrheic keratosis: Secondary | ICD-10-CM | POA: Diagnosis not present

## 2017-09-25 DIAGNOSIS — C44629 Squamous cell carcinoma of skin of left upper limb, including shoulder: Secondary | ICD-10-CM | POA: Diagnosis not present

## 2017-09-25 DIAGNOSIS — Z85828 Personal history of other malignant neoplasm of skin: Secondary | ICD-10-CM | POA: Diagnosis not present

## 2017-10-04 DIAGNOSIS — I10 Essential (primary) hypertension: Secondary | ICD-10-CM | POA: Diagnosis not present

## 2017-10-04 DIAGNOSIS — I2510 Atherosclerotic heart disease of native coronary artery without angina pectoris: Secondary | ICD-10-CM | POA: Diagnosis not present

## 2017-10-04 DIAGNOSIS — I5022 Chronic systolic (congestive) heart failure: Secondary | ICD-10-CM | POA: Diagnosis not present

## 2017-10-04 DIAGNOSIS — E782 Mixed hyperlipidemia: Secondary | ICD-10-CM | POA: Diagnosis not present

## 2017-10-16 DIAGNOSIS — C44629 Squamous cell carcinoma of skin of left upper limb, including shoulder: Secondary | ICD-10-CM | POA: Diagnosis not present

## 2017-10-16 DIAGNOSIS — L820 Inflamed seborrheic keratosis: Secondary | ICD-10-CM | POA: Diagnosis not present

## 2017-10-17 DIAGNOSIS — L578 Other skin changes due to chronic exposure to nonionizing radiation: Secondary | ICD-10-CM | POA: Diagnosis not present

## 2017-10-17 DIAGNOSIS — Z85828 Personal history of other malignant neoplasm of skin: Secondary | ICD-10-CM | POA: Diagnosis not present

## 2017-10-17 DIAGNOSIS — D0421 Carcinoma in situ of skin of right ear and external auricular canal: Secondary | ICD-10-CM | POA: Diagnosis not present

## 2017-10-17 DIAGNOSIS — L814 Other melanin hyperpigmentation: Secondary | ICD-10-CM | POA: Diagnosis not present

## 2017-10-17 DIAGNOSIS — L570 Actinic keratosis: Secondary | ICD-10-CM | POA: Diagnosis not present

## 2017-11-08 DIAGNOSIS — L97822 Non-pressure chronic ulcer of other part of left lower leg with fat layer exposed: Secondary | ICD-10-CM | POA: Diagnosis not present

## 2017-11-25 DIAGNOSIS — I252 Old myocardial infarction: Secondary | ICD-10-CM | POA: Diagnosis not present

## 2017-11-25 DIAGNOSIS — S22080A Wedge compression fracture of T11-T12 vertebra, initial encounter for closed fracture: Secondary | ICD-10-CM | POA: Diagnosis not present

## 2017-11-25 DIAGNOSIS — K5900 Constipation, unspecified: Secondary | ICD-10-CM | POA: Diagnosis not present

## 2017-11-25 DIAGNOSIS — M48061 Spinal stenosis, lumbar region without neurogenic claudication: Secondary | ICD-10-CM | POA: Diagnosis not present

## 2017-11-25 DIAGNOSIS — R109 Unspecified abdominal pain: Secondary | ICD-10-CM | POA: Diagnosis not present

## 2017-11-25 DIAGNOSIS — S300XXA Contusion of lower back and pelvis, initial encounter: Secondary | ICD-10-CM | POA: Diagnosis not present

## 2017-11-25 DIAGNOSIS — I10 Essential (primary) hypertension: Secondary | ICD-10-CM | POA: Diagnosis not present

## 2017-11-25 DIAGNOSIS — Z7982 Long term (current) use of aspirin: Secondary | ICD-10-CM | POA: Diagnosis not present

## 2017-11-25 DIAGNOSIS — Z89412 Acquired absence of left great toe: Secondary | ICD-10-CM | POA: Diagnosis not present

## 2017-11-25 DIAGNOSIS — Z79899 Other long term (current) drug therapy: Secondary | ICD-10-CM | POA: Diagnosis not present

## 2017-11-25 DIAGNOSIS — S39012A Strain of muscle, fascia and tendon of lower back, initial encounter: Secondary | ICD-10-CM | POA: Diagnosis not present

## 2017-11-27 DIAGNOSIS — K5900 Constipation, unspecified: Secondary | ICD-10-CM | POA: Diagnosis not present

## 2017-11-27 DIAGNOSIS — R109 Unspecified abdominal pain: Secondary | ICD-10-CM | POA: Diagnosis not present

## 2017-11-27 DIAGNOSIS — R140 Abdominal distension (gaseous): Secondary | ICD-10-CM | POA: Diagnosis not present

## 2017-11-27 DIAGNOSIS — I252 Old myocardial infarction: Secondary | ICD-10-CM | POA: Diagnosis not present

## 2017-11-27 DIAGNOSIS — Z89412 Acquired absence of left great toe: Secondary | ICD-10-CM | POA: Diagnosis not present

## 2017-11-27 DIAGNOSIS — G609 Hereditary and idiopathic neuropathy, unspecified: Secondary | ICD-10-CM | POA: Diagnosis not present

## 2017-11-27 DIAGNOSIS — Z85828 Personal history of other malignant neoplasm of skin: Secondary | ICD-10-CM | POA: Diagnosis not present

## 2017-11-27 DIAGNOSIS — Z7982 Long term (current) use of aspirin: Secondary | ICD-10-CM | POA: Diagnosis not present

## 2017-11-27 DIAGNOSIS — Z79899 Other long term (current) drug therapy: Secondary | ICD-10-CM | POA: Diagnosis not present

## 2017-11-27 DIAGNOSIS — I10 Essential (primary) hypertension: Secondary | ICD-10-CM | POA: Diagnosis not present

## 2017-12-04 DIAGNOSIS — L97811 Non-pressure chronic ulcer of other part of right lower leg limited to breakdown of skin: Secondary | ICD-10-CM | POA: Diagnosis not present

## 2017-12-04 DIAGNOSIS — L97822 Non-pressure chronic ulcer of other part of left lower leg with fat layer exposed: Secondary | ICD-10-CM | POA: Diagnosis not present

## 2017-12-07 DIAGNOSIS — Z6827 Body mass index (BMI) 27.0-27.9, adult: Secondary | ICD-10-CM | POA: Diagnosis not present

## 2017-12-07 DIAGNOSIS — R109 Unspecified abdominal pain: Secondary | ICD-10-CM | POA: Diagnosis not present

## 2017-12-07 DIAGNOSIS — M25552 Pain in left hip: Secondary | ICD-10-CM | POA: Diagnosis not present

## 2017-12-07 DIAGNOSIS — M545 Low back pain: Secondary | ICD-10-CM | POA: Diagnosis not present

## 2017-12-08 DIAGNOSIS — M160 Bilateral primary osteoarthritis of hip: Secondary | ICD-10-CM | POA: Diagnosis not present

## 2017-12-15 ENCOUNTER — Encounter: Payer: Self-pay | Admitting: Family Medicine

## 2017-12-15 ENCOUNTER — Telehealth: Payer: Self-pay | Admitting: Family Medicine

## 2017-12-15 ENCOUNTER — Ambulatory Visit (INDEPENDENT_AMBULATORY_CARE_PROVIDER_SITE_OTHER): Payer: Medicare Other | Admitting: Family Medicine

## 2017-12-15 DIAGNOSIS — M545 Low back pain, unspecified: Secondary | ICD-10-CM

## 2017-12-15 DIAGNOSIS — M150 Primary generalized (osteo)arthritis: Secondary | ICD-10-CM

## 2017-12-15 DIAGNOSIS — M159 Polyosteoarthritis, unspecified: Secondary | ICD-10-CM | POA: Insufficient documentation

## 2017-12-15 DIAGNOSIS — M8949 Other hypertrophic osteoarthropathy, multiple sites: Secondary | ICD-10-CM

## 2017-12-15 MED ORDER — PREDNISONE 10 MG PO TABS: ORAL_TABLET | ORAL | 0 refills | Status: DC

## 2017-12-15 MED ORDER — MELOXICAM 15 MG PO TABS: 15.0000 mg | ORAL_TABLET | Freq: Every day | ORAL | 0 refills | Status: DC

## 2017-12-15 NOTE — Telephone Encounter (Signed)
Discontinued Meloxicam, switched to Prednisone dosepak taper over 6 days. Patient to be notified by front office that this was done and to pick up at [employer], [facility] 12/15/2017, 3:41 PM

## 2017-12-15 NOTE — Patient Instructions (Addendum)
Thank you for coming to the office today.  May try Prevagen - this and other supplement medicines are not always regulated with medical studies therefore, I don't have a clear medical opinion on this. But I do have some patients that do well on this medicine. If you want to try it that is fine, as long as the cost is not too high this is reasonable.  1. For your Back Pain - I think that this is due to Muscle Spasms or strain - also in setting of Arthritis 2. Start with anti-inflammatory Meloxicam 15mg  daily (with food) every day for next 2 to 4 weeks if helping, then can use only as needed 3. May use Tylenol Extra Str 500mg  tabs - may take 1-2 tablets every 6 hours as needed 4. Recommend to start using heating pad on your lower back 1-2x daily for few weeks  This pain may take weeks to months to fully resolve, but hopefully it will respond to the medicine initially. All back injuries (small or serious) are slow to heal since we use our back muscles every day. Be careful with turning, twisting, lifting, sitting / standing for prolonged periods, and avoid re-injury.  If your symptoms significantly worsen with more pain, or new symptoms with weakness in one or both legs, new or different shooting leg pains, numbness in legs or groin, loss of control or retention of urine or bowel movements, please call back for advice and you may need to go directly to the Emergency Department.  If needed call us in 1-2 weeks and if pain worse or not improve we can send rx of Prednisone taper steroid anti inflammatory  Please schedule a Follow-up Appointment to: Return in about 1 month (around 01/12/2018), or if symptoms worsen or fail to improve, for back pain if not improved.  If you have any other questions or concerns, please feel free to call the office or send a message through [place]. You may also schedule an earlier appointment if necessary.  Additionally, you may be receiving a survey about your experience at  our office within a few days to 1 week by e-mail or mail. We value your feedback.  [name], DO [place]

## 2017-12-15 NOTE — Progress Notes (Signed)
Subjective:    Patient ID: [name], male    DOB: Nov 06, 1935, 82 y.o.   MRN: [id number]  [name] is a 82 y.o. male presenting on 12/15/2017 for Hospitalization Follow-up ( fall back pain and Hip pain more on Left side)   HPI   LOW BACK PAIN / History of Fall / Osteoarthritis Reports episode on while in [place] on June 10 of acute fall and back pain, he slipped and fell on accident did not have braces on, he landed fall with Right elbow and had abrasion. Additional issues as a result Admits 5 days later he did not have a bowel movement had persistent constipation. He went to out of state [facility] ED 6/15 and 6/17, was given stool softener, and given potassium due to low result, he then returned and given Mag Citrate and Miralax treatment, later this worked, and he had improved diet with more grapes / watermelon with higher fiber - this resolved and he has had more regular bowel movements now off medicine.  Last visit with PCP in [place] - Dr Fredderick Severance - seen on 12/07/17, had repeat X-ray of hips, Left was worse than right, was called on 12/11/17 with this result showed significant arthritis in hips, and was advised that he most likely aggravated his arthritis with the fall.   He still has residual mild to moderate soreness in low back, on both sides, and has aching with muscle spasms episodic. He is able to sleep with wedge of pillows on backside at night. He tried increasing Aspirin 81 to x 2 at bedtime with some relief. Provoked pain if more activity.  He was given rx Robaxin 500mg  at 6/15 ED, without good results, they switched it to Baclofen. He felt weakness in legs did not like it and did not improve his symptoms  He can tolerate NSAIDs. Last lab showed normal Creatinine. He does not have history of PUD  He started working on some home PT exercises for back but has scaled back now was more sore.  Denies any fevers/chills, numbness, tingling, weakness, loss of control  bladder/bowel incontinence or retention, unintentional wt loss, night sweats   Depression screen [facility] 2/9 12/15/2017 03/28/2017 09/23/2016  Decreased Interest 0 0 0  Down, Depressed, Hopeless 0 0 0  PHQ - 2 Score 0 0 0    Social History   Tobacco Use  . Smoking status: Never Smoker  . Smokeless tobacco: Never Used  Substance Use Topics  . Alcohol use: Yes    Alcohol/week: 0.0 oz    Comment: wine in the evening  . Drug use: No    Review of Systems Per HPI unless specifically indicated above     Objective:    BP 131/69   Pulse 68   Temp 98.2 F (36.8 C) (Oral)   Resp 16   Ht 6' (1.829 m)   Wt 217 lb (98.4 kg)   BMI 29.43 kg/m   Wt Readings from Last 3 Encounters:  12/15/17 217 lb (98.4 kg)  03/28/17 206 lb 6.4 oz (93.6 kg)  01/11/17 212 lb 12.8 oz (96.5 kg)    Physical Exam  Constitutional: He is oriented to person, place, and time. He appears well-developed and well-nourished. No distress.  Well-appearing, comfortable, cooperative  HENT:  Head: Normocephalic and atraumatic.  Mouth/Throat: Oropharynx is clear and moist.  Eyes: Conjunctivae are normal. Right eye exhibits no discharge. Left eye exhibits no discharge.  Cardiovascular: Normal rate.  Pulmonary/Chest: Effort normal.  Musculoskeletal:  He exhibits no edema.  Bilateral Lower Leg Braces in place, using Cane for ambulation assistance.  Low Back Inspection: Normal appearance, no spinal deformity, symmetrical. Palpation: No tenderness over spinous processes. Bilateral lumbar paraspinal muscles non-tender but with mild areas of hypertonicity/spasm L>R ROM: Full active ROM forward flex / back extension, rotation L/R without discomfort Special Testing: Seated SLR negative for radicular pain bilaterally  Strength: distally intact at baseline Neurovascular: intact distal sensation to light touch    Neurological: He is alert and oriented to person, place, and time.  Distal sensation to light touch reduced in  bilateral hands and feet, chronic unchanged   Skin: Skin is warm and dry. No rash noted. He is not diaphoretic. No erythema.  Psychiatric: He has a normal mood and affect. His behavior is normal.  Well groomed, good eye contact, normal speech and thoughts  Nursing note and vitals reviewed.  Results for orders placed or performed in visit on 03/23/17  COMPLETE METABOLIC PANEL WITH GFR  Result Value Ref Range   Glucose, Bld 101 (H) 65 - 99 mg/dL   BUN 13 7 - 25 mg/dL   Creat 0.79 0.70 - 1.18 mg/dL   GFR, Est Non African American 85 > OR = 60 mL/min/1.96m2   GFR, Est African American 99 > OR = 60 mL/min/1.52m2   BUN/Creatinine Ratio NOT APPLICABLE 6 - 22 (calc)   Sodium 134 (L) 135 - 146 mmol/L   Potassium 3.5 3.5 - 5.3 mmol/L   Chloride 94 (L) 98 - 110 mmol/L   CO2 28 20 - 32 mmol/L   Calcium 9.6 8.6 - 10.3 mg/dL   Total Protein 6.9 6.1 - 8.1 g/dL   Albumin 4.2 3.6 - 5.1 g/dL   Globulin 2.7 1.9 - 3.7 g/dL (calc)   AG Ratio 1.6 1.0 - 2.5 (calc)   Total Bilirubin 0.8 0.2 - 1.2 mg/dL   Alkaline phosphatase (APISO) 88 40 - 115 U/L   AST 65 (H) 10 - 35 U/L   ALT 41 9 - 46 U/L  Lipid panel  Result Value Ref Range   Cholesterol 171 <200 mg/dL   HDL 92 >40 mg/dL   Triglycerides 158 (H) <150 mg/dL   LDL Cholesterol (Calc) 55 mg/dL (calc)   Total CHOL/HDL Ratio 1.9 <5.0 (calc)   Non-HDL Cholesterol (Calc) 79 <130 mg/dL (calc)  CBC with Differential/Platelet  Result Value Ref Range   WBC 4.6 3.8 - 10.8 Thousand/uL   RBC 3.48 (L) 4.20 - 5.80 Million/uL   Hemoglobin 12.5 (L) 13.2 - 17.1 g/dL   HCT 36.9 (L) 38.5 - 50.0 %   MCV 106.0 (H) 80.0 - 100.0 fL   MCH 35.9 (H) 27.0 - 33.0 pg   MCHC 33.9 32.0 - 36.0 g/dL   RDW 13.1 11.0 - 15.0 %   Platelets 237 140 - 400 Thousand/uL   MPV 9.1 7.5 - 12.5 fL   Neutro Abs 2,723 1,500 - 7,800 cells/uL   Lymphs Abs 1,352 850 - 3,900 cells/uL   WBC mixed population 386 200 - 950 cells/uL   Eosinophils Absolute 110 15 - 500 cells/uL   Basophils  Absolute 28 0 - 200 cells/uL   Neutrophils Relative % 59.2 %   Total Lymphocyte 29.4 %   Monocytes Relative 8.4 %   Eosinophils Relative 2.4 %   Basophils Relative 0.6 %  Hemoglobin A1c  Result Value Ref Range   Hgb A1c MFr Bld 4.8 <5.7 % of total Hgb   Mean Plasma Glucose 91 (  calc)   eAG (mmol/L) 5.0 (calc)  B12 and Folate Panel  Result Value Ref Range   Vitamin B-12 1,875 (H) 200 - 1,100 pg/mL   Folate >24.0 ng/mL  TEST AUTHORIZATION  Result Value Ref Range   TEST NAME: VITAMIN B12/FOLATE,    TEST CODE: [id number]    CLIENT CONTACT: ALEX Yelitza Reach    REPORT ALWAYS MESSAGE SIGNATURE        Assessment & Plan:   Problem List Items Addressed This Visit    Acute left-sided low back pain without sciatica   Relevant Medications   meloxicam (MOBIC) 15 MG tablet   Osteoarthritis of multiple joints   Relevant Medications   meloxicam (MOBIC) 15 MG tablet      Acute to Subacute L>R LBP without associated sciatica. Suspect likely due to muscle spasm/strain vs arthritis flared up from recent fall injury about 1 month ago. 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 No prior back surgery - Complicated by chronic foot drop and neuropathy, affecting lower extremities - No red flag symptoms. Negative SLR for radiculopathy Inadequate conservative therapy. Failed some muscle relaxant courses Robaxin, Baclofen  Plan: 1. Start anti-inflammatory trial with rx Meloxicam 15mg  daily wc x 1-2 weeks, then PRN 2. Offered alternative muscle relaxant - will defer for now, since others not helpful, caution inc risk of fall sedation 3. May use Tylenol PRN for breakthrough 4. Encouraged use of heating pad 1-2x daily for now then PRN - also may try muscle rub OTC 5. Follow-up 4-6 weeks, re-evaluation. If not improved consider X-ray imaging. Consider trial of PT for strengthening.  Also if not improved sooner may call back consider  short prednisone taper course for arthritis/back pain   Meds ordered this encounter  Medications  . meloxicam (MOBIC) 15 MG tablet    Sig: Take 1 tablet (15 mg total) by mouth daily. With food. For 1-2 weeks then as needed only    Dispense:  30 tablet    Refill:  0    Follow up plan: Return in about 1 month (around 01/12/2018), or if symptoms worsen or fail to improve, for back pain if not improved.   [name], [facility] 12/15/2017, 12:45 PM

## 2017-12-15 NOTE — Telephone Encounter (Signed)
Pt decided against meloxicam and asked to prednisone instead.

## 2017-12-27 ENCOUNTER — Other Ambulatory Visit: Payer: Self-pay

## 2017-12-27 ENCOUNTER — Ambulatory Visit (INDEPENDENT_AMBULATORY_CARE_PROVIDER_SITE_OTHER): Payer: Medicare Other | Admitting: Family Medicine

## 2017-12-27 ENCOUNTER — Encounter: Payer: Self-pay | Admitting: Family Medicine

## 2017-12-27 VITALS — BP 113/64 | HR 84 | Temp 98.6°F | Ht 72.0 in | Wt 208.2 lb

## 2017-12-27 DIAGNOSIS — L918 Other hypertrophic disorders of the skin: Secondary | ICD-10-CM | POA: Diagnosis not present

## 2017-12-27 NOTE — Patient Instructions (Addendum)
Thank you for coming to the office today.  Recommend return to your [facility] Dermatologist for evaluation and removal of large skin tag in gluteal cleft region, to make sure this heals properly and avoid wound - because of the pressure in this area with it healing as you sit.  Try to offload pressure between now and then and avoid direct sitting on it for long period.  It is not an infection - no boil - no need to drain any pus. No antibiotics at this time.  Continue medicine for back pain as needed.  Please schedule a Follow-up Appointment to: Return if symptoms worsen or fail to improve, for skin tag, need to see Dermatology.  If you have any other questions or concerns, please feel free to call the office or send a message through [place]. You may also schedule an earlier appointment if necessary.  Additionally, you may be receiving a survey about your experience at our office within a few days to 1 week by e-mail or mail. We value your feedback.  [name], DO [place]

## 2017-12-27 NOTE — Progress Notes (Signed)
Subjective:    Patient ID: [name], male    DOB: 12-24-1935, 82 y.o.   MRN: [id number]  [name] is a 82 y.o. male presenting on 12/27/2017 for irritated skin tag vs boil (on the right glute x 3 days ) and Back Pain (history of an acute fall x 6 weeks ago  )   HPI   Gluteal Skin Tag inflamed Patient is here to have a skin abnormality of R gluteal region evaluated - noticed it became a problem over past 3 days, he was concerned it was a boil or abscess. He was unable to really see what it was in the mirror and wanted it checked out. He has admitted some discomfort and pain only certain seated positions on R gluteal region. - No prior boil or abscess in this area Denies any fevers chills sweats, drainage of pus, redness spreading elsewhere  Follow-up Back pain from fall Last seen 12/15/17, see note for background information, he was ultimately treated with prednisone course taper, and today he reports overall improved he is feeling much better with regards to back pain, no new injury or problem.   Depression screen [facility] 2/9 12/15/2017 03/28/2017 09/23/2016  Decreased Interest 0 0 0  Down, Depressed, Hopeless 0 0 0  PHQ - 2 Score 0 0 0    Social History   Tobacco Use  . Smoking status: Never Smoker  . Smokeless tobacco: Never Used  Substance Use Topics  . Alcohol use: Yes    Alcohol/week: 0.0 oz    Comment: wine in the evening  . Drug use: No    Review of Systems Per HPI unless specifically indicated above     Objective:    BP 113/64 (BP Location: Left Arm, Patient Position: Sitting, Cuff Size: Normal)   Pulse 84   Temp 98.6 F (37 C) (Oral)   Ht 6' (1.829 m)   Wt 208 lb 3.2 oz (94.4 kg)   BMI 28.24 kg/m   Wt Readings from Last 3 Encounters:  12/27/17 208 lb 3.2 oz (94.4 kg)  12/15/17 217 lb (98.4 kg)  03/28/17 206 lb 6.4 oz (93.6 kg)    Physical Exam  Constitutional: He is oriented to person, place, and time. He appears well-developed and well-nourished. No  distress.  Well-appearing, comfortable, cooperative  HENT:  Head: Normocephalic and atraumatic.  Mouth/Throat: Oropharynx is clear and moist.  Eyes: Conjunctivae are normal. Right eye exhibits no discharge. Left eye exhibits no discharge.  Cardiovascular: Normal rate.  Pulmonary/Chest: Effort normal.  Musculoskeletal: He exhibits no edema.  Neurological: He is alert and oriented to person, place, and time.  Skin: Skin is warm and dry. No rash noted. He is not diaphoretic. No erythema.  Right Gluteal - 0.5 to 1 cm moderately sized skin tag vs appendage does not seem swollen but slightly inflamed and irritated, it does not have any induration or erythema at base, two other smaller non inflamed skin tags nearby. Non tender to palpation. No other abnormality seen on gluteal region or cleft. No focal abscess or ulceration.  Psychiatric: He has a normal mood and affect. Marcus behavior is normal.  Well groomed, good eye contact, normal speech and thoughts  Nursing note and vitals reviewed.    Right Gluteal region     Results for orders placed or performed in visit on 03/23/17  COMPLETE METABOLIC PANEL WITH GFR  Result Value Ref Range   Glucose, Bld 101 (H) 65 - 99 mg/dL   BUN 13  7 - 25 mg/dL   Creat 0.79 0.70 - 1.18 mg/dL   GFR, Est Non African American 85 > OR = 60 mL/min/1.30m2   GFR, Est African American 99 > OR = 60 mL/min/1.19m2   BUN/Creatinine Ratio NOT APPLICABLE 6 - 22 (calc)   Sodium 134 (L) 135 - 146 mmol/L   Potassium 3.5 3.5 - 5.3 mmol/L   Chloride 94 (L) 98 - 110 mmol/L   CO2 28 20 - 32 mmol/L   Calcium 9.6 8.6 - 10.3 mg/dL   Total Protein 6.9 6.1 - 8.1 g/dL   Albumin 4.2 3.6 - 5.1 g/dL   Globulin 2.7 1.9 - 3.7 g/dL (calc)   AG Ratio 1.6 1.0 - 2.5 (calc)   Total Bilirubin 0.8 0.2 - 1.2 mg/dL   Alkaline phosphatase (APISO) 88 40 - 115 U/L   AST 65 (H) 10 - 35 U/L   ALT 41 9 - 46 U/L  Lipid panel  Result Value Ref Range   Cholesterol 171 <200 mg/dL   HDL 92 >40 mg/dL    Triglycerides 158 (H) <150 mg/dL   LDL Cholesterol (Calc) 55 mg/dL (calc)   Total CHOL/HDL Ratio 1.9 <5.0 (calc)   Non-HDL Cholesterol (Calc) 79 <130 mg/dL (calc)  CBC with Differential/Platelet  Result Value Ref Range   WBC 4.6 3.8 - 10.8 Thousand/uL   RBC 3.48 (L) 4.20 - 5.80 Million/uL   Hemoglobin 12.5 (L) 13.2 - 17.1 g/dL   HCT 36.9 (L) 38.5 - 50.0 %   MCV 106.0 (H) 80.0 - 100.0 fL   MCH 35.9 (H) 27.0 - 33.0 pg   MCHC 33.9 32.0 - 36.0 g/dL   RDW 13.1 11.0 - 15.0 %   Platelets 237 140 - 400 Thousand/uL   MPV 9.1 7.5 - 12.5 fL   Neutro Abs 2,723 1,500 - 7,800 cells/uL   Lymphs Abs 1,352 850 - 3,900 cells/uL   WBC mixed population 386 200 - 950 cells/uL   Eosinophils Absolute 110 15 - 500 cells/uL   Basophils Absolute 28 0 - 200 cells/uL   Neutrophils Relative % 59.2 %   Total Lymphocyte 29.4 %   Monocytes Relative 8.4 %   Eosinophils Relative 2.4 %   Basophils Relative 0.6 %  Hemoglobin A1c  Result Value Ref Range   Hgb A1c MFr Bld 4.8 <5.7 % of total Hgb   Mean Plasma Glucose 91 (calc)   eAG (mmol/L) 5.0 (calc)  B12 and Folate Panel  Result Value Ref Range   Vitamin B-12 1,875 (H) 200 - 1,100 pg/mL   Folate >24.0 ng/mL  TEST AUTHORIZATION  Result Value Ref Range   TEST NAME: VITAMIN B12/FOLATE,    TEST CODE: [id number]    CLIENT CONTACT: ALEX KARAMALEGOS    REPORT ALWAYS MESSAGE SIGNATURE        Assessment & Plan:   Problem List Items Addressed This Visit    None    Visit Diagnoses    Inflamed skin tag    -  Primary      Clinically seems to be cause of Marcus symptoms recent positional pain on sitting. Suspect related to direct pressure or impingement on this inflamed skin tag. Uncertain onset of this as he has other skin tags in area, may have been present a while. No sign of secondary infection or cellulitis or abscess. No ulceration or bed sore.  Plan Not indicated for I&D or antibiotic treatment Try to offload direct pressure to avoid worsening Recommend  that he follow-up with  [name], [name] for 2nd opinion and likely biopsy removal - given sensitive area and large stalk connecting to gluteal skin would recommend that they perform this procedure to ensure good wound healing, and avoid gluteal ulceration or bed sore.  Follow-up as needed  No orders of the defined types were placed in this encounter.     Follow up plan: Return if symptoms worsen or fail to improve, for skin tag, need to see Dermatology.  [name], [facility] 12/27/2017, 5:22 PM

## 2017-12-28 DIAGNOSIS — H6121 Impacted cerumen, right ear: Secondary | ICD-10-CM | POA: Diagnosis not present

## 2017-12-28 DIAGNOSIS — H903 Sensorineural hearing loss, bilateral: Secondary | ICD-10-CM | POA: Diagnosis not present

## 2018-01-01 DIAGNOSIS — L918 Other hypertrophic disorders of the skin: Secondary | ICD-10-CM | POA: Diagnosis not present

## 2018-01-02 DIAGNOSIS — G6289 Other specified polyneuropathies: Secondary | ICD-10-CM | POA: Diagnosis not present

## 2018-01-02 DIAGNOSIS — Z89422 Acquired absence of other left toe(s): Secondary | ICD-10-CM | POA: Diagnosis not present

## 2018-01-02 DIAGNOSIS — M2042 Other hammer toe(s) (acquired), left foot: Secondary | ICD-10-CM | POA: Diagnosis not present

## 2018-01-22 DIAGNOSIS — H35033 Hypertensive retinopathy, bilateral: Secondary | ICD-10-CM | POA: Diagnosis not present

## 2018-01-22 DIAGNOSIS — H25093 Other age-related incipient cataract, bilateral: Secondary | ICD-10-CM | POA: Diagnosis not present

## 2018-01-30 DIAGNOSIS — L570 Actinic keratosis: Secondary | ICD-10-CM | POA: Diagnosis not present

## 2018-01-30 DIAGNOSIS — Z85828 Personal history of other malignant neoplasm of skin: Secondary | ICD-10-CM | POA: Diagnosis not present

## 2018-01-30 DIAGNOSIS — L820 Inflamed seborrheic keratosis: Secondary | ICD-10-CM | POA: Diagnosis not present

## 2018-01-30 DIAGNOSIS — L821 Other seborrheic keratosis: Secondary | ICD-10-CM | POA: Diagnosis not present

## 2018-01-30 DIAGNOSIS — D225 Melanocytic nevi of trunk: Secondary | ICD-10-CM | POA: Diagnosis not present

## 2018-01-31 ENCOUNTER — Other Ambulatory Visit: Payer: Self-pay | Admitting: Family Medicine

## 2018-01-31 DIAGNOSIS — I5022 Chronic systolic (congestive) heart failure: Secondary | ICD-10-CM

## 2018-01-31 DIAGNOSIS — I10 Essential (primary) hypertension: Secondary | ICD-10-CM

## 2018-01-31 DIAGNOSIS — G609 Hereditary and idiopathic neuropathy, unspecified: Secondary | ICD-10-CM

## 2018-01-31 DIAGNOSIS — I25810 Atherosclerosis of coronary artery bypass graft(s) without angina pectoris: Secondary | ICD-10-CM

## 2018-01-31 DIAGNOSIS — R7309 Other abnormal glucose: Secondary | ICD-10-CM

## 2018-01-31 DIAGNOSIS — D539 Nutritional anemia, unspecified: Secondary | ICD-10-CM

## 2018-01-31 DIAGNOSIS — Z8546 Personal history of malignant neoplasm of prostate: Secondary | ICD-10-CM

## 2018-01-31 DIAGNOSIS — E782 Mixed hyperlipidemia: Secondary | ICD-10-CM

## 2018-02-27 DIAGNOSIS — Z23 Encounter for immunization: Secondary | ICD-10-CM | POA: Diagnosis not present

## 2018-02-28 ENCOUNTER — Other Ambulatory Visit: Payer: Medicare Other

## 2018-02-28 DIAGNOSIS — E782 Mixed hyperlipidemia: Secondary | ICD-10-CM | POA: Diagnosis not present

## 2018-02-28 DIAGNOSIS — G609 Hereditary and idiopathic neuropathy, unspecified: Secondary | ICD-10-CM | POA: Diagnosis not present

## 2018-02-28 DIAGNOSIS — D539 Nutritional anemia, unspecified: Secondary | ICD-10-CM | POA: Diagnosis not present

## 2018-02-28 DIAGNOSIS — R7309 Other abnormal glucose: Secondary | ICD-10-CM | POA: Diagnosis not present

## 2018-02-28 DIAGNOSIS — I10 Essential (primary) hypertension: Secondary | ICD-10-CM | POA: Diagnosis not present

## 2018-02-28 DIAGNOSIS — Z8546 Personal history of malignant neoplasm of prostate: Secondary | ICD-10-CM | POA: Diagnosis not present

## 2018-02-28 DIAGNOSIS — I5022 Chronic systolic (congestive) heart failure: Secondary | ICD-10-CM | POA: Diagnosis not present

## 2018-03-01 LAB — CBC WITH DIFFERENTIAL/PLATELET
BASOS PCT: 0.5 %
Basophils Absolute: 19 cells/uL (ref 0–200)
EOS ABS: 163 {cells}/uL (ref 15–500)
Eosinophils Relative: 4.4 %
HCT: 36 % — ABNORMAL LOW (ref 38.5–50.0)
Hemoglobin: 12.3 g/dL — ABNORMAL LOW (ref 13.2–17.1)
Lymphs Abs: 1243 cells/uL (ref 850–3900)
MCH: 35.5 pg — AB (ref 27.0–33.0)
MCHC: 34.2 g/dL (ref 32.0–36.0)
MCV: 104 fL — AB (ref 80.0–100.0)
MPV: 9.6 fL (ref 7.5–12.5)
Monocytes Relative: 10.9 %
NEUTROS PCT: 50.6 %
Neutro Abs: 1872 cells/uL (ref 1500–7800)
PLATELETS: 250 10*3/uL (ref 140–400)
RBC: 3.46 10*6/uL — ABNORMAL LOW (ref 4.20–5.80)
RDW: 12.5 % (ref 11.0–15.0)
TOTAL LYMPHOCYTE: 33.6 %
WBC: 3.7 10*3/uL — ABNORMAL LOW (ref 3.8–10.8)
WBCMIX: 403 {cells}/uL (ref 200–950)

## 2018-03-01 LAB — IRON,TIBC AND FERRITIN PANEL
%SAT: 68 % (calc) — ABNORMAL HIGH (ref 20–48)
FERRITIN: 368 ng/mL (ref 24–380)
Iron: 166 ug/dL (ref 50–180)
TIBC: 244 ug/dL — AB (ref 250–425)

## 2018-03-01 LAB — COMPLETE METABOLIC PANEL WITH GFR
AG Ratio: 1.7 (calc) (ref 1.0–2.5)
ALBUMIN MSPROF: 4.1 g/dL (ref 3.6–5.1)
ALT: 32 U/L (ref 9–46)
AST: 33 U/L (ref 10–35)
Alkaline phosphatase (APISO): 78 U/L (ref 40–115)
BUN: 12 mg/dL (ref 7–25)
CALCIUM: 9.6 mg/dL (ref 8.6–10.3)
CO2: 25 mmol/L (ref 20–32)
CREATININE: 0.73 mg/dL (ref 0.70–1.11)
Chloride: 93 mmol/L — ABNORMAL LOW (ref 98–110)
GFR, EST NON AFRICAN AMERICAN: 88 mL/min/{1.73_m2} (ref >=60)
GFR, Est African American: 102 mL/min/{1.73_m2} (ref >=60)
GLOBULIN: 2.4 g/dL (ref 1.9–3.7)
Glucose, Bld: 94 mg/dL (ref 65–99)
Potassium: 3.8 mmol/L (ref 3.5–5.3)
SODIUM: 132 mmol/L — AB (ref 135–146)
TOTAL PROTEIN: 6.5 g/dL (ref 6.1–8.1)
Total Bilirubin: 0.6 mg/dL (ref 0.2–1.2)

## 2018-03-01 LAB — LIPID PANEL
Cholesterol: 141 mg/dL (ref <200)
HDL: 78 mg/dL (ref >40)
LDL Cholesterol (Calc): 38 mg/dL (calc)
NON-HDL CHOLESTEROL (CALC): 63 mg/dL (ref <130)
TRIGLYCERIDES: 167 mg/dL — AB (ref <150)
Total CHOL/HDL Ratio: 1.8 (calc) (ref <5.0)

## 2018-03-01 LAB — HEMOGLOBIN A1C
HEMOGLOBIN A1C: 4.9 %{Hb} (ref <5.7)
MEAN PLASMA GLUCOSE: 94 (calc)
eAG (mmol/L): 5.2 (calc)

## 2018-03-01 LAB — PSA: PSA: <0.1 ng/mL (ref <=4.0)

## 2018-03-01 LAB — VITAMIN B12: Vitamin B-12: 819 pg/mL (ref 200–1100)

## 2018-03-06 ENCOUNTER — Ambulatory Visit (INDEPENDENT_AMBULATORY_CARE_PROVIDER_SITE_OTHER): Payer: Medicare Other | Admitting: Family Medicine

## 2018-03-06 ENCOUNTER — Encounter: Payer: Self-pay | Admitting: Family Medicine

## 2018-03-06 VITALS — BP 137/80 | HR 92 | Temp 98.4°F | Resp 16 | Ht 72.0 in | Wt 205.0 lb

## 2018-03-06 DIAGNOSIS — S98112A Complete traumatic amputation of left great toe, initial encounter: Secondary | ICD-10-CM | POA: Diagnosis not present

## 2018-03-06 DIAGNOSIS — M159 Polyosteoarthritis, unspecified: Secondary | ICD-10-CM

## 2018-03-06 DIAGNOSIS — I10 Essential (primary) hypertension: Secondary | ICD-10-CM

## 2018-03-06 DIAGNOSIS — Z89412 Acquired absence of left great toe: Secondary | ICD-10-CM

## 2018-03-06 DIAGNOSIS — R110 Nausea: Secondary | ICD-10-CM

## 2018-03-06 DIAGNOSIS — M150 Primary generalized (osteo)arthritis: Secondary | ICD-10-CM | POA: Diagnosis not present

## 2018-03-06 DIAGNOSIS — Z8546 Personal history of malignant neoplasm of prostate: Secondary | ICD-10-CM

## 2018-03-06 DIAGNOSIS — M79674 Pain in right toe(s): Secondary | ICD-10-CM

## 2018-03-06 DIAGNOSIS — I5022 Chronic systolic (congestive) heart failure: Secondary | ICD-10-CM

## 2018-03-06 DIAGNOSIS — E782 Mixed hyperlipidemia: Secondary | ICD-10-CM | POA: Diagnosis not present

## 2018-03-06 DIAGNOSIS — I25810 Atherosclerosis of coronary artery bypass graft(s) without angina pectoris: Secondary | ICD-10-CM | POA: Diagnosis not present

## 2018-03-06 DIAGNOSIS — D539 Nutritional anemia, unspecified: Secondary | ICD-10-CM | POA: Diagnosis not present

## 2018-03-06 DIAGNOSIS — G609 Hereditary and idiopathic neuropathy, unspecified: Secondary | ICD-10-CM

## 2018-03-06 DIAGNOSIS — E871 Hypo-osmolality and hyponatremia: Secondary | ICD-10-CM

## 2018-03-06 DIAGNOSIS — G8929 Other chronic pain: Secondary | ICD-10-CM

## 2018-03-06 DIAGNOSIS — M8949 Other hypertrophic osteoarthropathy, multiple sites: Secondary | ICD-10-CM

## 2018-03-06 MED ORDER — MELOXICAM 15 MG PO TABS: 15.0000 mg | ORAL_TABLET | Freq: Every day | ORAL | 1 refills | Status: DC | PRN

## 2018-03-06 MED ORDER — ONDANSETRON 8 MG PO TBDP: 8.0000 mg | ORAL_TABLET | Freq: Three times a day (TID) | ORAL | 2 refills | Status: DC | PRN

## 2018-03-06 MED ORDER — POTASSIUM CHLORIDE ER 20 MEQ PO TBCR: 20.0000 meq | EXTENDED_RELEASE_TABLET | Freq: Every day | ORAL | 3 refills | Status: DC

## 2018-03-06 NOTE — Assessment & Plan Note (Signed)
Stable, without new symptoms or new etiology Chronic problem based on labs Stable anemia panel, not iron deficiency No new abnormal result vitamin b12 or folate  Plan Continue monitor routine labs CBC, can re-check additional labs in future Continue supplements, does not need iron supplement

## 2018-03-06 NOTE — Assessment & Plan Note (Signed)
Stable without evidence of recurrence PSA < 0.1, negative 

## 2018-03-06 NOTE — Assessment & Plan Note (Signed)
Stable, without anginal symptoms On med management s/p CABG 1987 Followed by [facility] Cardiology Dr Nehemiah Massed

## 2018-03-06 NOTE — Assessment & Plan Note (Signed)
Recent acute flare R great toe, in setting of known OA/DJD multiple joints  Plan Restart Meloxicam trial 15mg  daily 1-2 weeks then PRN Continue topicals may hold voltaren since trial on oral NSAID RICE therapy Avoid overuse reinjury Future may need Podiatry consider joint injection toe joint if not improving - future x-rays

## 2018-03-06 NOTE — Progress Notes (Signed)
Subjective:    Patient ID: [name], male    DOB: 05-Mar-1936, 82 y.o.   MRN: [id number]  [name] is a 82 y.o. male presenting on 03/06/2018 for Arthritis and Nausea   HPI   Specialists: Cardiology - Dr Serafina Royals [facility] Cardiology) Podiatry - Dr Buddy Duty [facility] Podiatry)  [place] / History of Hypokalemia See prior note for background information. - Interval update with interval re-check lab with stabilized Na reading at 132 now prior near 134, no further symptoms from hyponatremia or other concerns - Today patient reports doing well. Pleased with improved sodium - Continues to take HCTZ 25mg  daily - Other electrolyte imbalance with concern low K, his last lab showed K 3.5. He is taking K supplement 57mEQ daily needs refill. Due to diuretic.  ELEVATED AST - RESOLVED.  Systolic CHF / CAD / HTN - Followed by [facility] Cardiology Dr Nehemiah Massed, last apt 09/2017, has continued current medications for CHF including lasix, and K supplement, controlled HTN and continues to monitor CAD on meds with ARB, Statin, ASA  Chronic Polyneuropathy / S/p L toe amputation - Still has chronic neuropathy affecting his function and ambulation. Requires braces and cane support.  CHRONIC ANEMIA, Macrocytic - Reports history of anemia for many years, chart review back to 2013, with similar results, history of elevated MCV, patient is unsure about type of anemia. He states was referred to Hematologist at one point and they did extensive work-up including monitor levels for several months without significant change, no further treatment recommended - Additional labs with Vitamin B12 and Folate that were normal in past - Now recent labs show normal Vitamin B12 and stable Hgb. - he continues to take Vitamin B12 and Folate   HYPERLIPIDEMIA: - Reports no concerns. Last lipid panel 9.2019, controlled  - Currently taking Pravastatin 80mg , tolerating well without side effects or  myalgias  Additional complaints:  History of Nausea / Upset Stomach Reports he has episodic symptoms of nausea and some regurgitation. Dr Bernell List, in [place], prescribed medicine Zofran 8mg  ODT PRN for AM symptoms with nausea, regurgitation at times episodes. Good results, request to refill this med. PRN use only, rarely uses. It has lasted >1 year  Osteoarthritis, R Great Toe - Reports flared up for past few weeks, no new injury or problem - Usually attributed flare to pressure weather changes - Tried topical Voltaren gel, and other creams, Tiger balm, patches - limited results - Takes a Benadryl PRN sleep due to pain - Using ice pack PRN - Previously was on Meloxicam PRN for prior back pain back 12/2017 unsure if this would help his toe.  Additional social history - patient routinely leaves to live part of year in [place]. He will leave with his wife, [name], on 04/10/18 and return in March 2020. He will follow-up with his primary care provider Dr Bernell List down in [place] Maintenance: UTD on Flu vaccine 02/26/18. UTD on PNA vaccine  PSA screening < 0.1, negative. History of prostate cancer.  Depression screen [facility] 2/9 03/06/2018 12/15/2017 03/28/2017  Decreased Interest 0 0 0  Down, Depressed, Hopeless 0 0 0  PHQ - 2 Score 0 0 0    Past Medical History:  Diagnosis Date  . Arthritis   . Benign essential tremor   . Fracture, femur ([place]) 2012  . GERD (gastroesophageal reflux disease)   . H/O echocardiogram    05/2013  . Hx of colonoscopy    04/22/2013  . Hypercholesterolemia   .  Myocardial infarction ([place])    1987  . Polyneuropathy   . Prostate cancer ([place]) 2004  . Superficial hematoma    Past Surgical History:  Procedure Laterality Date  . BASAL CELL CARCINOMA EXCISION    . COLONOSCOPY  05/01/2009   Positive for colonic polyps  . COLONOSCOPY WITH PROPOFOL N/A 02/24/2016   Procedure: COLONOSCOPY WITH PROPOFOL;  Surgeon: Christene Lye, MD;  Location: [facility] ENDOSCOPY;   Service: Endoscopy;  Laterality: N/A;  . CORONARY ARTERY BYPASS GRAFT  1987   X2  . HERNIA REPAIR  1990  . PROSTATE SURGERY  2004   Social History   Socioeconomic History  . Marital status: Married    Spouse name: Not on file  . Number of children: Not on file  . Years of education: Not on file  . Highest education level: Not on file  Occupational History  . Not on file  Social Needs  . Financial resource strain: Not on file  . Food insecurity:    Worry: Not on file    Inability: Not on file  . Transportation needs:    Medical: Not on file    Non-medical: Not on file  Tobacco Use  . Smoking status: Never Smoker  . Smokeless tobacco: Never Used  Substance and Sexual Activity  . Alcohol use: Yes    Alcohol/week: 0.0 standard drinks    Comment: wine in the evening  . Drug use: No  . Sexual activity: Not on file  Lifestyle  . Physical activity:    Days per week: Not on file    Minutes per session: Not on file  . Stress: Not on file  Relationships  . Social connections:    Talks on phone: Not on file    Gets together: Not on file    Attends religious service: Not on file    Active member of club or organization: Not on file    Attends meetings of clubs or organizations: Not on file    Relationship status: Not on file  . Intimate partner violence:    Fear of current or ex partner: Not on file    Emotionally abused: Not on file    Physically abused: Not on file    Forced sexual activity: Not on file  Other Topics Concern  . Not on file  Social History Narrative  . Not on file   Family History  Problem Relation Age of Onset  . Heart disease Mother    Current Outpatient Medications on File Prior to Visit  Medication Sig  . Alpha Lipoic Acid 200 MG CAPS Take 2 capsules by mouth daily.  Marland Kitchen amLODipine (NORVASC) 2.5 MG tablet TAKE 1 TABLET BY MOUTH  DAILY  . Apoaequorin (PREVAGEN PO) Take by mouth.  Marland Kitchen aspirin 81 MG tablet Take 81 mg by mouth daily.  . B Complex  Vitamins (VITAMIN-B COMPLEX PO) Take 1 tablet by mouth daily. Reported on 10/06/2015  . Biotin 1000 MCG tablet Take 1,000 mcg by mouth daily.  . Calcium Carb-Cholecalciferol 500-100 MG-UNIT CHEW Chew 500 mg by mouth daily.  . cyanocobalamin 1000 MCG tablet Take 1,000 mcg by mouth daily.  . folic acid (FOLVITE) 1 MG tablet Take 1 mg by mouth daily.  . furosemide (LASIX) 20 MG tablet TAKE 1 TABLET BY MOUTH  DAILY  . gabapentin (NEURONTIN) 600 MG tablet TAKE 2 TABLETS BY MOUTH TWO TIMES DAILY  . Glucosamine Sulfate 1000 MG CAPS Take 1 capsule by mouth daily.  . hydrochlorothiazide (  HYDRODIURIL) 25 MG tablet Take 1 tablet (25 mg total) by mouth daily.  . Lecithin 400 MG CAPS Take 400 mg by mouth daily.  Marland Kitchen losartan (COZAAR) 50 MG tablet TAKE 1 TABLET BY MOUTH  DAILY  . Melatonin 3 MG CAPS Take 3 mg by mouth at bedtime.  . Omega 3 1000 MG CAPS Take 1,000 mg by mouth daily.  Marland Kitchen omeprazole (PRILOSEC) 20 MG capsule TAKE 1 CAPSULE BY MOUTH 2  TIMES DAILY BEFORE MEALS  . pravastatin (PRAVACHOL) 80 MG tablet TAKE 1 TABLET BY MOUTH  DAILY  . primidone (MYSOLINE) 50 MG tablet TAKE 1 TABLET BY MOUTH 3  TIMES DAILY  . Selenium 200 MCG CAPS Take 200 mcg by mouth daily.  Marland Kitchen tretinoin (RETIN-A) 0.05 % cream Apply 1 application topically at bedtime.   No current facility-administered medications on file prior to visit.     Review of Systems  Constitutional: Negative for activity change, appetite change, chills, diaphoresis, fatigue and fever.  HENT: Negative for congestion and hearing loss.   Eyes: Negative for visual disturbance.  Respiratory: Negative for apnea, cough, choking, chest tightness, shortness of breath and wheezing.   Cardiovascular: Negative for chest pain, palpitations and leg swelling.  Gastrointestinal: Negative for abdominal pain, anal bleeding, blood in stool, constipation, diarrhea, nausea and vomiting.  Endocrine: Negative for cold intolerance.  Genitourinary: Negative for difficulty  urinating, dysuria, frequency and hematuria.  Musculoskeletal: Positive for arthralgias (R great toe) and gait problem. Negative for back pain and neck pain.  Skin: Negative for rash.  Allergic/Immunologic: Negative for environmental allergies.  Neurological: Positive for weakness (chronic stable) and numbness (chronic neuropathy). Negative for dizziness, light-headedness and headaches.  Hematological: Negative for adenopathy.  Psychiatric/Behavioral: Negative for behavioral problems, dysphoric mood and sleep disturbance. The patient is not nervous/anxious.    Per HPI unless specifically indicated above     Objective:    BP 137/80   Pulse 92   Temp 98.4 F (36.9 C) (Oral)   Resp 16   Ht 6' (1.829 m)   Wt 205 lb (93 kg)   BMI 27.80 kg/m   Wt Readings from Last 3 Encounters:  03/06/18 205 lb (93 kg)  12/27/17 208 lb 3.2 oz (94.4 kg)  12/15/17 217 lb (98.4 kg)    Physical Exam  Constitutional: He is oriented to person, place, and time. He appears well-developed and well-nourished. No distress.  Elderly 82 yr male, currently well appearing, comfortable, cooperative.  Wearing b/l lower extremity support AFO for chronic foot drop. Has cane  HENT:  Head: Normocephalic and atraumatic.  Mouth/Throat: Oropharynx is clear and moist.  Frontal / maxillary sinuses non-tender. Nares patent without purulence or edema. Oropharynx clear without erythema, exudates, edema or asymmetry.  Hearing aid in place  Eyes: Pupils are equal, round, and reactive to light. Conjunctivae and EOM are normal. Right eye exhibits no discharge. Left eye exhibits no discharge.  Neck: Normal range of motion. Neck supple. No thyromegaly present.  Cardiovascular: Normal rate, regular rhythm, normal heart sounds and intact distal pulses.  No murmur heard. Pulmonary/Chest: Effort normal and breath sounds normal. No respiratory distress. He has no wheezes. He has no rales.  Abdominal: Soft. Bowel sounds are normal. He  exhibits no distension and no mass. There is no tenderness.  Musculoskeletal: Normal range of motion. He exhibits no edema or tenderness.  - S/p L great toe amputation at MTP  Lymphadenopathy:    He has no cervical adenopathy.  Neurological: He is alert  and oriented to person, place, and time.  Distal sensation to light touch reduced in bilateral hands and feet, chronic unchanged  Skin: Skin is warm and dry. No rash noted. He is not diaphoretic. No erythema.  Psychiatric: He has a normal mood and affect. His behavior is normal.  Well groomed, good eye contact, normal speech and thoughts  Nursing note and vitals reviewed.  Results for orders placed or performed in visit on 02/28/18  Vitamin B12  Result Value Ref Range   Vitamin B-12 819 200 - 1,100 pg/mL  Iron, TIBC and Ferritin Panel  Result Value Ref Range   Iron 166 50 - 180 mcg/dL   TIBC 244 (L) 250 - 425 mcg/dL (calc)   %SAT 68 (H) 20 - 48 % (calc)   Ferritin 368 24 - 380 ng/mL  PSA  Result Value Ref Range   PSA <0.1 < OR = 4.0 ng/mL  Lipid panel  Result Value Ref Range   Cholesterol 141 <200 mg/dL   HDL 78 >40 mg/dL   Triglycerides 167 (H) <150 mg/dL   LDL Cholesterol (Calc) 38 mg/dL (calc)   Total CHOL/HDL Ratio 1.8 <5.0 (calc)   Non-HDL Cholesterol (Calc) 63 <130 mg/dL (calc)  COMPLETE METABOLIC PANEL WITH GFR  Result Value Ref Range   Glucose, Bld 94 65 - 99 mg/dL   BUN 12 7 - 25 mg/dL   Creat 0.73 0.70 - 1.11 mg/dL   GFR, Est Non African American 88 > OR = 60 mL/min/1.19m2   GFR, Est African American 102 > OR = 60 mL/min/1.103m2   BUN/Creatinine Ratio NOT APPLICABLE 6 - 22 (calc)   Sodium 132 (L) 135 - 146 mmol/L   Potassium 3.8 3.5 - 5.3 mmol/L   Chloride 93 (L) 98 - 110 mmol/L   CO2 25 20 - 32 mmol/L   Calcium 9.6 8.6 - 10.3 mg/dL   Total Protein 6.5 6.1 - 8.1 g/dL   Albumin 4.1 3.6 - 5.1 g/dL   Globulin 2.4 1.9 - 3.7 g/dL (calc)   AG Ratio 1.7 1.0 - 2.5 (calc)   Total Bilirubin 0.6 0.2 - 1.2 mg/dL    Alkaline phosphatase (APISO) 78 40 - 115 U/L   AST 33 10 - 35 U/L   ALT 32 9 - 46 U/L  CBC with Differential/Platelet  Result Value Ref Range   WBC 3.7 (L) 3.8 - 10.8 Thousand/uL   RBC 3.46 (L) 4.20 - 5.80 Million/uL   Hemoglobin 12.3 (L) 13.2 - 17.1 g/dL   HCT 36.0 (L) 38.5 - 50.0 %   MCV 104.0 (H) 80.0 - 100.0 fL   MCH 35.5 (H) 27.0 - 33.0 pg   MCHC 34.2 32.0 - 36.0 g/dL   RDW 12.5 11.0 - 15.0 %   Platelets 250 140 - 400 Thousand/uL   MPV 9.6 7.5 - 12.5 fL   Neutro Abs 1,872 1,500 - 7,800 cells/uL   Lymphs Abs 1,243 850 - 3,900 cells/uL   WBC mixed population 403 200 - 950 cells/uL   Eosinophils Absolute 163 15 - 500 cells/uL   Basophils Absolute 19 0 - 200 cells/uL   Neutrophils Relative % 50.6 %   Total Lymphocyte 33.6 %   Monocytes Relative 10.9 %   Eosinophils Relative 4.4 %   Basophils Relative 0.5 %  Hemoglobin A1c  Result Value Ref Range   Hgb A1c MFr Bld 4.9 <5.7 % of total Hgb   Mean Plasma Glucose 94 (calc)   eAG (mmol/L) 5.2 (calc)  Assessment & Plan:   Problem List Items Addressed This Visit    Amputee, great toe, left ([place])    Stable without new concern S/p L great toe MTP amputation 2018      CAD (coronary artery disease)    Stable, without anginal symptoms On med management s/p CABG 1987 Followed by [facility] Cardiology Dr Nehemiah Massed      Chronic hyponatremia    Stable range Na 132-132 Remains on HCTZ and lasix K supplement Continue improved fluid intake Follow-up yearly, may re-check sooner q 6 if indicated      Chronic systolic CHF (congestive heart failure), NYHA class 2 (HCC)    Stable chronic CHF, remains euvolemic Continues med management for fluid with Lasix 20mg  daily, and K supplement On ARB Followed by Cardiology      Essential hypertension - Primary    Well-controlled HTN - Home BP readings normal  No known complications Followed by Cardiology [facility]  Plan:  1. Continue current BP regimen - Losartan, HCTZ, Lasix, K supplement 2.  Encourage improved lifestyle - low sodium diet, regular exercise 3. Continue monitor BP outside office, bring readings to next visit, if persistently >140/90 or new symptoms notify office sooner 4. Follow-up q 6 mo      Relevant Medications   Potassium Chloride ER 20 MEQ TBCR   History of prostate cancer    Stable without evidence of recurrence PSA < 0.1, negative      Hyperlipidemia, mixed    Controlled cholesterol on statin and lifestyle Last lipid panel 02/2018 Known CAD s/p CABG  Plan: 1. Continue current meds - Pravastatin 80mg  daily 2. Continue ASA 81mg  for secondary ASCVD risk reduction 3. Encourage improved lifestyle - low carb/cholesterol, reduce portion size, continue improving regular exercise      Idiopathic peripheral neuropathy    Chronic problem, bilateral distal extremities, associated with some joint deformity. See A&P      Macrocytic anemia    Stable, without new symptoms or new etiology Chronic problem based on labs Stable anemia panel, not iron deficiency No new abnormal result vitamin b12 or folate  Plan Continue monitor routine labs CBC, can re-check additional labs in future Continue supplements, does not need iron supplement      Osteoarthritis of multiple joints    Recent acute flare R great toe, in setting of known OA/DJD multiple joints  Plan Restart Meloxicam trial 15mg  daily 1-2 weeks then PRN Continue topicals may hold voltaren since trial on oral NSAID RICE therapy Avoid overuse reinjury Future may need Podiatry consider joint injection toe joint if not improving - future x-rays      Relevant Medications   meloxicam (MOBIC) 15 MG tablet    Other Visit Diagnoses    Nausea     Not actively LIkely dietary or other factor possibly pain related Trial on continued PRN Zofran very rarely using, refill sent locally not mail order    Relevant Medications   ondansetron (ZOFRAN-ODT) 8 MG disintegrating tablet   Chronic toe pain, right  foot     See A&P    Relevant Medications   meloxicam (MOBIC) 15 MG tablet      Meds ordered this encounter  Medications  . ondansetron (ZOFRAN-ODT) 8 MG disintegrating tablet    Sig: Take 1 tablet (8 mg total) by mouth every 8 (eight) hours as needed for nausea or vomiting.    Dispense:  30 tablet    Refill:  2  . Potassium Chloride ER 20 MEQ TBCR  Sig: Take 20 mEq by mouth daily. Take 2 tablets each day    Dispense:  90 tablet    Refill:  3  . meloxicam (MOBIC) 15 MG tablet    Sig: Take 1 tablet (15 mg total) by mouth daily as needed for pain. For up to 1-2 weeks, then reduce to less frequent for longer if need    Dispense:  30 tablet    Refill:  1    Follow up plan: Return in about 7 months (around 10/05/2018) for 7 month check up, Arthritis, Med review - consider future labs chem/CBC.  [name], [facility] 03/06/2018, 1:35 PM

## 2018-03-06 NOTE — Assessment & Plan Note (Signed)
Controlled cholesterol on statin and lifestyle Last lipid panel 02/2018 Known CAD s/p CABG  Plan: 1. Continue current meds - Pravastatin 80mg  daily 2. Continue ASA 81mg  for secondary ASCVD risk reduction 3. Encourage improved lifestyle - low carb/cholesterol, reduce portion size, continue improving regular exercise

## 2018-03-06 NOTE — Assessment & Plan Note (Signed)
Stable chronic CHF, remains euvolemic Continues med management for fluid with Lasix 20mg  daily, and K supplement On ARB Followed by Cardiology

## 2018-03-06 NOTE — Assessment & Plan Note (Signed)
Stable range Na 132-132 Remains on HCTZ and lasix K supplement Continue improved fluid intake Follow-up yearly, may re-check sooner q 6 if indicated

## 2018-03-06 NOTE — Assessment & Plan Note (Signed)
Stable without new concern S/p L great toe MTP amputation 2018

## 2018-03-06 NOTE — Assessment & Plan Note (Signed)
Chronic problem, bilateral distal extremities, associated with some joint deformity. See A&P

## 2018-03-06 NOTE — Patient Instructions (Addendum)
Thank you for coming to the office today.  Refilled - Zofran for nausea as needed - sent to [facility]. As discussed overall good results.  Rx Meloxicam for R toe pain, arthritis. May use for 1-2 week then as needed in future. Hopefully just a flare up, in future we may need to reconsider Podiatry or Ortho for possible joint injection if not improving.  Please schedule a Follow-up Appointment to: Return in about 7 months (around 10/05/2018) for 7 month check up, Arthritis, Med review - consider future labs chem/CBC.  If you have any other questions or concerns, please feel free to call the office or send a message through [place]. You may also schedule an earlier appointment if necessary.  Additionally, you may be receiving a survey about your experience at our office within a few days to 1 week by e-mail or mail. We value your feedback.  [name], DO [place]

## 2018-03-06 NOTE — Assessment & Plan Note (Signed)
Well-controlled HTN - Home BP readings normal  No known complications Followed by Cardiology [facility]  Plan:  1. Continue current BP regimen - Losartan, HCTZ, Lasix, K supplement 2. Encourage improved lifestyle - low sodium diet, regular exercise 3. Continue monitor BP outside office, bring readings to next visit, if persistently >140/90 or new symptoms notify office sooner 4. Follow-up q 6 mo

## 2018-03-13 DIAGNOSIS — L821 Other seborrheic keratosis: Secondary | ICD-10-CM | POA: Diagnosis not present

## 2018-03-13 DIAGNOSIS — L570 Actinic keratosis: Secondary | ICD-10-CM | POA: Diagnosis not present

## 2018-03-13 DIAGNOSIS — C44311 Basal cell carcinoma of skin of nose: Secondary | ICD-10-CM | POA: Diagnosis not present

## 2018-03-13 DIAGNOSIS — D225 Melanocytic nevi of trunk: Secondary | ICD-10-CM | POA: Diagnosis not present

## 2018-03-13 DIAGNOSIS — D485 Neoplasm of uncertain behavior of skin: Secondary | ICD-10-CM | POA: Diagnosis not present

## 2018-03-19 DIAGNOSIS — M2042 Other hammer toe(s) (acquired), left foot: Secondary | ICD-10-CM | POA: Diagnosis not present

## 2018-03-19 DIAGNOSIS — Z89412 Acquired absence of left great toe: Secondary | ICD-10-CM | POA: Diagnosis not present

## 2018-03-19 DIAGNOSIS — I739 Peripheral vascular disease, unspecified: Secondary | ICD-10-CM | POA: Diagnosis not present

## 2018-03-19 DIAGNOSIS — B351 Tinea unguium: Secondary | ICD-10-CM | POA: Diagnosis not present

## 2018-03-19 DIAGNOSIS — M2041 Other hammer toe(s) (acquired), right foot: Secondary | ICD-10-CM | POA: Diagnosis not present

## 2018-03-20 ENCOUNTER — Other Ambulatory Visit: Payer: Self-pay

## 2018-03-29 DIAGNOSIS — H9190 Unspecified hearing loss, unspecified ear: Secondary | ICD-10-CM | POA: Diagnosis not present

## 2018-04-04 DIAGNOSIS — I10 Essential (primary) hypertension: Secondary | ICD-10-CM | POA: Diagnosis not present

## 2018-04-04 DIAGNOSIS — I5022 Chronic systolic (congestive) heart failure: Secondary | ICD-10-CM | POA: Diagnosis not present

## 2018-04-04 DIAGNOSIS — I2510 Atherosclerotic heart disease of native coronary artery without angina pectoris: Secondary | ICD-10-CM | POA: Diagnosis not present

## 2018-04-04 DIAGNOSIS — E782 Mixed hyperlipidemia: Secondary | ICD-10-CM | POA: Diagnosis not present

## 2018-04-05 DIAGNOSIS — L570 Actinic keratosis: Secondary | ICD-10-CM | POA: Diagnosis not present

## 2018-04-05 DIAGNOSIS — C44311 Basal cell carcinoma of skin of nose: Secondary | ICD-10-CM | POA: Diagnosis not present

## 2018-04-05 DIAGNOSIS — L814 Other melanin hyperpigmentation: Secondary | ICD-10-CM | POA: Diagnosis not present

## 2018-04-05 DIAGNOSIS — L578 Other skin changes due to chronic exposure to nonionizing radiation: Secondary | ICD-10-CM | POA: Diagnosis not present

## 2018-04-05 DIAGNOSIS — Z85828 Personal history of other malignant neoplasm of skin: Secondary | ICD-10-CM | POA: Diagnosis not present

## 2018-04-23 DIAGNOSIS — D539 Nutritional anemia, unspecified: Secondary | ICD-10-CM | POA: Diagnosis not present

## 2018-04-23 DIAGNOSIS — Z0000 Encounter for general adult medical examination without abnormal findings: Secondary | ICD-10-CM | POA: Diagnosis not present

## 2018-04-23 DIAGNOSIS — E785 Hyperlipidemia, unspecified: Secondary | ICD-10-CM | POA: Diagnosis not present

## 2018-04-23 DIAGNOSIS — Z6827 Body mass index (BMI) 27.0-27.9, adult: Secondary | ICD-10-CM | POA: Diagnosis not present

## 2018-04-23 DIAGNOSIS — I10 Essential (primary) hypertension: Secondary | ICD-10-CM | POA: Diagnosis not present

## 2018-04-23 DIAGNOSIS — Z23 Encounter for immunization: Secondary | ICD-10-CM | POA: Diagnosis not present

## 2018-04-25 DIAGNOSIS — L97811 Non-pressure chronic ulcer of other part of right lower leg limited to breakdown of skin: Secondary | ICD-10-CM | POA: Diagnosis not present

## 2018-04-25 DIAGNOSIS — L97822 Non-pressure chronic ulcer of other part of left lower leg with fat layer exposed: Secondary | ICD-10-CM | POA: Diagnosis not present

## 2018-05-24 DIAGNOSIS — L739 Follicular disorder, unspecified: Secondary | ICD-10-CM | POA: Diagnosis not present

## 2018-05-24 DIAGNOSIS — L814 Other melanin hyperpigmentation: Secondary | ICD-10-CM | POA: Diagnosis not present

## 2018-05-24 DIAGNOSIS — D485 Neoplasm of uncertain behavior of skin: Secondary | ICD-10-CM | POA: Diagnosis not present

## 2018-07-23 ENCOUNTER — Other Ambulatory Visit: Payer: Self-pay | Admitting: Family Medicine

## 2018-07-23 DIAGNOSIS — I10 Essential (primary) hypertension: Secondary | ICD-10-CM

## 2018-07-23 DIAGNOSIS — K219 Gastro-esophageal reflux disease without esophagitis: Secondary | ICD-10-CM

## 2018-07-23 DIAGNOSIS — G250 Essential tremor: Secondary | ICD-10-CM

## 2018-07-23 DIAGNOSIS — G609 Hereditary and idiopathic neuropathy, unspecified: Secondary | ICD-10-CM

## 2018-07-23 DIAGNOSIS — E782 Mixed hyperlipidemia: Secondary | ICD-10-CM

## 2018-08-08 ENCOUNTER — Ambulatory Visit: Payer: Medicare Other | Admitting: Family Medicine

## 2018-08-22 DIAGNOSIS — S93692A Other sprain of left foot, initial encounter: Secondary | ICD-10-CM | POA: Diagnosis not present

## 2018-08-30 DIAGNOSIS — L853 Xerosis cutis: Secondary | ICD-10-CM | POA: Diagnosis not present

## 2018-08-30 DIAGNOSIS — L209 Atopic dermatitis, unspecified: Secondary | ICD-10-CM | POA: Diagnosis not present

## 2018-09-05 ENCOUNTER — Other Ambulatory Visit: Payer: Self-pay

## 2018-09-05 ENCOUNTER — Telehealth (INDEPENDENT_AMBULATORY_CARE_PROVIDER_SITE_OTHER): Payer: Medicare Other | Admitting: Family Medicine

## 2018-09-05 ENCOUNTER — Telehealth: Payer: Self-pay | Admitting: Family Medicine

## 2018-09-05 ENCOUNTER — Ambulatory Visit: Payer: Medicare Other | Admitting: Family Medicine

## 2018-09-05 DIAGNOSIS — M21372 Foot drop, left foot: Secondary | ICD-10-CM | POA: Diagnosis not present

## 2018-09-05 DIAGNOSIS — M8949 Other hypertrophic osteoarthropathy, multiple sites: Secondary | ICD-10-CM

## 2018-09-05 DIAGNOSIS — Z89412 Acquired absence of left great toe: Secondary | ICD-10-CM

## 2018-09-05 DIAGNOSIS — M150 Primary generalized (osteo)arthritis: Secondary | ICD-10-CM

## 2018-09-05 DIAGNOSIS — M21371 Foot drop, right foot: Secondary | ICD-10-CM | POA: Diagnosis not present

## 2018-09-05 DIAGNOSIS — G609 Hereditary and idiopathic neuropathy, unspecified: Secondary | ICD-10-CM | POA: Diagnosis not present

## 2018-09-05 DIAGNOSIS — M159 Polyosteoarthritis, unspecified: Secondary | ICD-10-CM

## 2018-09-05 NOTE — Progress Notes (Signed)
Virtual Visit via Telephone The purpose of this virtual visit is to provide medical care while limiting exposure to the novel coronavirus (COVID19) for both patient and office staff.  Consent was obtained for phone visit:  Yes.   Answered questions that patient had about telehealth interaction:  Yes.   I discussed the limitations, risks, security and privacy concerns of performing an evaluation and management service by telephone. I also discussed with the patient that there may be a patient responsible charge related to this service. The patient expressed understanding and agreed to proceed.  Patient Location: Home Provider Location: [facility] (Office)  ---------------------------------------------------------------------- CC: Bilateral Foot Drop / Gait Difficulty / Chronic Peripheral Neuropathy  S: Reviewed CMA telephone note below. I have called patient and gathered additional HPI as follows: also wife [name] is present as well.  Reports that symptoms started recently he has had issues with falling episodes, usually he is without his leg braces if he were to briefly take them off, and he had mechanical fall due to old socks that got stuck. Now he has new socks and is doing better at avoiding falls but he still is asking about PT referral for balance and strengthening - he would like to go back to [facility] - History of bilateral leg fracture in 2012, worked with [facility] at [facility] in [facility]  Patient is currently at home in isolation Denies any high risk travel to areas of current concern for [place]. Denies any known or suspected exposure to person with or possibly with COVID19.  Admits chronic numbness peripheral neuropathy, fall Denies worsening joint pain, bleeding bruising other injury, dizziness, lightheadedness, chest pain dyspnea, syncopal episode  Past Medical History:  Diagnosis Date  . Arthritis   . Benign essential tremor   . Fracture,  femur ([place]) 2012  . GERD (gastroesophageal reflux disease)   . H/O echocardiogram    05/2013  . Hx of colonoscopy    04/22/2013  . Hypercholesterolemia   . Myocardial infarction ([place])    1987  . Polyneuropathy   . Prostate cancer ([place]) 2004  . Superficial hematoma    Social History   Tobacco Use  . Smoking status: Never Smoker  . Smokeless tobacco: Never Used  Substance Use Topics  . Alcohol use: Yes    Alcohol/week: 0.0 standard drinks    Comment: wine in the evening  . Drug use: No    Current Outpatient Medications:  .  Alpha Lipoic Acid 200 MG CAPS, Take 2 capsules by mouth daily., Disp: , Rfl:  .  amLODipine (NORVASC) 2.5 MG tablet, TAKE 1 TABLET BY MOUTH  DAILY, Disp: 90 tablet, Rfl: 3 .  Apoaequorin (PREVAGEN PO), Take by mouth., Disp: , Rfl:  .  aspirin 81 MG tablet, Take 81 mg by mouth daily., Disp: , Rfl:  .  B Complex Vitamins (VITAMIN-B COMPLEX PO), Take 1 tablet by mouth daily. Reported on 10/06/2015, Disp: , Rfl:  .  Biotin 1000 MCG tablet, Take 1,000 mcg by mouth daily., Disp: , Rfl:  .  Calcium Carb-Cholecalciferol 500-100 MG-UNIT CHEW, Chew 500 mg by mouth daily., Disp: , Rfl:  .  cyanocobalamin 1000 MCG tablet, Take 1,000 mcg by mouth daily., Disp: , Rfl:  .  folic acid (FOLVITE) 1 MG tablet, Take 1 mg by mouth daily., Disp: , Rfl:  .  furosemide (LASIX) 20 MG tablet, TAKE 1 TABLET BY MOUTH  DAILY, Disp: 90 tablet, Rfl: 3 .  gabapentin (NEURONTIN) 600  MG tablet, TAKE 2 TABLETS BY MOUTH TWO TIMES DAILY, Disp: 360 tablet, Rfl: 3 .  Glucosamine Sulfate 1000 MG CAPS, Take 1 capsule by mouth daily., Disp: , Rfl:  .  hydrochlorothiazide (HYDRODIURIL) 25 MG tablet, Take 1 tablet (25 mg total) by mouth daily., Disp: 90 tablet, Rfl: 3 .  Lecithin 400 MG CAPS, Take 400 mg by mouth daily., Disp: , Rfl:  .  losartan (COZAAR) 50 MG tablet, TAKE 1 TABLET BY MOUTH  DAILY, Disp: 90 tablet, Rfl: 3 .  Melatonin 3 MG CAPS, Take 3 mg by mouth at bedtime., Disp: , Rfl:  .   meloxicam (MOBIC) 15 MG tablet, Take 1 tablet (15 mg total) by mouth daily as needed for pain. For up to 1-2 weeks, then reduce to less frequent for longer if need, Disp: 30 tablet, Rfl: 1 .  Omega 3 1000 MG CAPS, Take 1,000 mg by mouth daily., Disp: , Rfl:  .  omeprazole (PRILOSEC) 20 MG capsule, TAKE 1 CAPSULE BY MOUTH TWO TIMES DAILY BEFORE MEALS, Disp: 180 capsule, Rfl: 3 .  ondansetron (ZOFRAN-ODT) 8 MG disintegrating tablet, Take 1 tablet (8 mg total) by mouth every 8 (eight) hours as needed for nausea or vomiting., Disp: 30 tablet, Rfl: 2 .  Potassium Chloride ER 20 MEQ TBCR, Take 20 mEq by mouth daily. Take 2 tablets each day, Disp: 90 tablet, Rfl: 3 .  pravastatin (PRAVACHOL) 80 MG tablet, TAKE 1 TABLET BY MOUTH  DAILY, Disp: 90 tablet, Rfl: 3 .  primidone (MYSOLINE) 50 MG tablet, TAKE 1 TABLET BY MOUTH 3  TIMES DAILY, Disp: 270 tablet, Rfl: 3 .  Selenium 200 MCG CAPS, Take 200 mcg by mouth daily., Disp: , Rfl:  .  tretinoin (RETIN-A) 0.05 % cream, Apply 1 application topically at bedtime., Disp: , Rfl:   Depression screen [facility] 2/9 09/05/2018 03/06/2018 12/15/2017  Decreased Interest 0 0 0  Down, Depressed, Hopeless 0 0 0  PHQ - 2 Score 0 0 0    No flowsheet data found.  -------------------------------------------------------------------------- O: No physical exam performed due to remote telephone encounter.   No results found for this or any previous visit (from the past 2160 hour(s)).  -------------------------------------------------------------------------- A&P:  Problem List Items Addressed This Visit    Amputee, great toe, left (HCC)   Foot drop, bilateral - Primary   Idiopathic peripheral neuropathy   Osteoarthritis of multiple joints     Clinically persistent chronic conditions affecting his gait/balance, multiple such as foot drop bilateral requiring AFO braces, peripheral neuropathy and arthritis in several joints, also s/p L great toe amputation.  Recent worsening  fall when not using AFO braces  Plan - Agree to proceed with referral to ambulatory physical therapy - requested [facility] in [place], he will call to find out their hours and if open and if accepting referral - call us back and we can fax written order to them, or he can schedule in future when they resume patient care - Continue other therapy at this time, reviewed fall precautions and continue AFO braces - Follow-up in future in office if requires F2F visit for home health if he needs other therapy options   Follow-up: - Return as needed  Patient verbalizes understanding with the above medical recommendations including the limitation of remote medical advice.  Specific follow-up and call-back criteria were given for patient to follow-up or seek medical care more urgently if needed.   - Time spent in direct consultation with patient on phone: 13  minutes  [name], DO [facility] 09/05/2018, 10:52 AM

## 2018-09-05 NOTE — Progress Notes (Signed)
losing balance and gait problem.

## 2018-09-05 NOTE — Patient Instructions (Signed)
Notify us when you confirm plans with [facility]. I can fax a written order to them to allow you to schedule physical therapy when you are ready, need to know which location.  If you have any other questions or concerns, please feel free to call the office or send a message through [place]. You may also schedule an earlier appointment if necessary.  Additionally, you may be receiving a survey about your experience at our office within a few days to 1 week by e-mail or mail. We value your feedback.  [name], DO [place]

## 2018-09-05 NOTE — Telephone Encounter (Signed)
Pt's physical therapy was [facility] in [facility] phone [phone] and fax [phone]

## 2018-09-06 NOTE — Telephone Encounter (Signed)
Signed written order. To be faxed 09/06/18  [name], [facility] 09/06/2018, 9:38 AM

## 2018-09-24 ENCOUNTER — Other Ambulatory Visit: Payer: Self-pay | Admitting: Family Medicine

## 2018-09-24 DIAGNOSIS — I10 Essential (primary) hypertension: Secondary | ICD-10-CM

## 2018-10-01 DIAGNOSIS — R262 Difficulty in walking, not elsewhere classified: Secondary | ICD-10-CM | POA: Diagnosis not present

## 2018-10-01 DIAGNOSIS — R296 Repeated falls: Secondary | ICD-10-CM | POA: Diagnosis not present

## 2018-10-03 DIAGNOSIS — R296 Repeated falls: Secondary | ICD-10-CM | POA: Diagnosis not present

## 2018-10-03 DIAGNOSIS — R262 Difficulty in walking, not elsewhere classified: Secondary | ICD-10-CM | POA: Diagnosis not present

## 2018-10-09 ENCOUNTER — Telehealth: Payer: Self-pay

## 2018-10-09 DIAGNOSIS — R262 Difficulty in walking, not elsewhere classified: Secondary | ICD-10-CM | POA: Diagnosis not present

## 2018-10-09 DIAGNOSIS — R296 Repeated falls: Secondary | ICD-10-CM | POA: Diagnosis not present

## 2018-10-09 NOTE — Telephone Encounter (Signed)
For now start with general wound recommendations  - Keep wound clean with soap and water rinse - He has allergy to neosporin and other topical antibiotic, therefore, he should just use plain vaseline as needed or any ointment that does not cause him allergy - may apply non adherent pad and wrap over this area if needed due to bleeding  If any signs of infection - redness worsening pain swelling, drainage of pus, fever or chills - he should call to schedule virtual visit.  If still not healing - we may need to consider a referral back to wound care if needed.  [name], [facility] 10/09/2018, 1:20 PM

## 2018-10-09 NOTE — Telephone Encounter (Signed)
Patient fell on past Wednesday 04/22 had some bleeding on knee but improved, his concerned that site is not heeling please suggest ?

## 2018-10-09 NOTE — Telephone Encounter (Signed)
Left message for patient to call back  

## 2018-10-10 NOTE — Telephone Encounter (Signed)
I attempted to contact the pt, no answer. LMOM to return my call.

## 2018-10-10 NOTE — Telephone Encounter (Signed)
The pt was notified of the providers recommendations on wound care. He verbalize understanding. No questions or concerns.

## 2018-10-12 DIAGNOSIS — R296 Repeated falls: Secondary | ICD-10-CM | POA: Diagnosis not present

## 2018-10-12 DIAGNOSIS — R262 Difficulty in walking, not elsewhere classified: Secondary | ICD-10-CM | POA: Diagnosis not present

## 2018-10-16 DIAGNOSIS — R262 Difficulty in walking, not elsewhere classified: Secondary | ICD-10-CM | POA: Diagnosis not present

## 2018-10-16 DIAGNOSIS — R296 Repeated falls: Secondary | ICD-10-CM | POA: Diagnosis not present

## 2018-10-16 DIAGNOSIS — I2510 Atherosclerotic heart disease of native coronary artery without angina pectoris: Secondary | ICD-10-CM | POA: Diagnosis not present

## 2018-10-16 DIAGNOSIS — E782 Mixed hyperlipidemia: Secondary | ICD-10-CM | POA: Diagnosis not present

## 2018-10-16 DIAGNOSIS — I5022 Chronic systolic (congestive) heart failure: Secondary | ICD-10-CM | POA: Diagnosis not present

## 2018-10-16 DIAGNOSIS — I10 Essential (primary) hypertension: Secondary | ICD-10-CM | POA: Diagnosis not present

## 2018-10-18 DIAGNOSIS — H903 Sensorineural hearing loss, bilateral: Secondary | ICD-10-CM | POA: Diagnosis not present

## 2018-10-18 DIAGNOSIS — H6123 Impacted cerumen, bilateral: Secondary | ICD-10-CM | POA: Diagnosis not present

## 2018-10-23 DIAGNOSIS — R296 Repeated falls: Secondary | ICD-10-CM | POA: Diagnosis not present

## 2018-10-23 DIAGNOSIS — R262 Difficulty in walking, not elsewhere classified: Secondary | ICD-10-CM | POA: Diagnosis not present

## 2018-10-26 DIAGNOSIS — I739 Peripheral vascular disease, unspecified: Secondary | ICD-10-CM | POA: Diagnosis not present

## 2018-10-26 DIAGNOSIS — L603 Nail dystrophy: Secondary | ICD-10-CM | POA: Diagnosis not present

## 2018-10-26 DIAGNOSIS — R296 Repeated falls: Secondary | ICD-10-CM | POA: Diagnosis not present

## 2018-10-26 DIAGNOSIS — B351 Tinea unguium: Secondary | ICD-10-CM | POA: Diagnosis not present

## 2018-10-26 DIAGNOSIS — R262 Difficulty in walking, not elsewhere classified: Secondary | ICD-10-CM | POA: Diagnosis not present

## 2018-10-26 DIAGNOSIS — Z89412 Acquired absence of left great toe: Secondary | ICD-10-CM | POA: Diagnosis not present

## 2018-10-29 DIAGNOSIS — L820 Inflamed seborrheic keratosis: Secondary | ICD-10-CM | POA: Diagnosis not present

## 2018-10-29 DIAGNOSIS — Z85828 Personal history of other malignant neoplasm of skin: Secondary | ICD-10-CM | POA: Diagnosis not present

## 2018-10-29 DIAGNOSIS — L570 Actinic keratosis: Secondary | ICD-10-CM | POA: Diagnosis not present

## 2018-10-29 DIAGNOSIS — D225 Melanocytic nevi of trunk: Secondary | ICD-10-CM | POA: Diagnosis not present

## 2018-10-29 DIAGNOSIS — D485 Neoplasm of uncertain behavior of skin: Secondary | ICD-10-CM | POA: Diagnosis not present

## 2018-10-29 DIAGNOSIS — L821 Other seborrheic keratosis: Secondary | ICD-10-CM | POA: Diagnosis not present

## 2018-11-06 DIAGNOSIS — I10 Essential (primary) hypertension: Secondary | ICD-10-CM | POA: Diagnosis not present

## 2018-11-06 LAB — HEPATIC FUNCTION PANEL
ALT: 23 (ref 10–40)
AST: 25 (ref 14–40)
Alkaline Phosphatase: 64 (ref 25–125)
Bilirubin, Total: 0.6

## 2018-11-06 LAB — CBC AND DIFFERENTIAL
HCT: 39 — AB (ref 41–53)
Hemoglobin: 12.5 — AB (ref 13.5–17.5)
Platelets: 256 (ref 150–399)
WBC: 4

## 2018-11-06 LAB — BASIC METABOLIC PANEL
BUN: 12 (ref 4–21)
Creatinine: 0.7 (ref 0.6–1.3)
Glucose: 110
Potassium: 3.9 (ref 3.4–5.3)
Sodium: 133 — AB (ref 137–147)

## 2018-11-06 LAB — LIPID PANEL
Cholesterol: 165 (ref 0–200)
HDL: 90 — AB (ref 35–70)
LDL Cholesterol: 61
Triglycerides: 71 (ref 40–160)

## 2018-11-06 LAB — MICROALBUMIN, URINE: Microalb, Ur: 1.2

## 2018-11-07 ENCOUNTER — Telehealth: Payer: Self-pay | Admitting: Family Medicine

## 2018-11-07 NOTE — Telephone Encounter (Signed)
@  [facility]   Outreach Note  11/07/2018 Name: [name] MRN: [id number] DOB: [birth year]  Referred by: [name], DO Reason for referral : Chronic Care Management (Initial outreach call was unsuccessful.)   An unsuccessful telephone outreach was attempted today. The patient was referred to the case management team by for assistance with chronic care management and care coordination.   Follow Up Plan: A HIPPA compliant phone message was left for the patient providing contact information and requesting a return call.  The CM team will reach out to the patient again over the next 7 days.  If patient returns call to provider office, please advise to call [facility] [place]  [email or web address]   ??[phone]

## 2018-11-07 NOTE — Telephone Encounter (Deleted)
@  [facility]   Outreach Note  11/07/2018 Name: Finch Costanzo MRN: [id number] DOB: [birth year]  Referred by: [name], DO Reason for referral : Chronic Care Management (Initial outreach call was unsuccessful.)   An unsuccessful telephone outreach was attempted today. The patient was referred to the case management team by for assistance with chronic care management and care coordination.   Follow Up Plan: Was unable to leave message.The CM team will reach out to the patient again over the next 7 days.   [place]  [email or web address]   ??[phone]

## 2018-11-12 DIAGNOSIS — I2510 Atherosclerotic heart disease of native coronary artery without angina pectoris: Secondary | ICD-10-CM | POA: Diagnosis not present

## 2018-11-12 DIAGNOSIS — E871 Hypo-osmolality and hyponatremia: Secondary | ICD-10-CM | POA: Diagnosis not present

## 2018-11-12 DIAGNOSIS — D638 Anemia in other chronic diseases classified elsewhere: Secondary | ICD-10-CM | POA: Diagnosis not present

## 2018-11-12 DIAGNOSIS — Z6827 Body mass index (BMI) 27.0-27.9, adult: Secondary | ICD-10-CM | POA: Diagnosis not present

## 2018-11-12 DIAGNOSIS — I10 Essential (primary) hypertension: Secondary | ICD-10-CM | POA: Diagnosis not present

## 2018-11-12 DIAGNOSIS — K219 Gastro-esophageal reflux disease without esophagitis: Secondary | ICD-10-CM | POA: Diagnosis not present

## 2018-11-12 NOTE — Telephone Encounter (Signed)
@  [facility]   Outreach Note  11/12/2018 Name: [name] MRN: [id number] DOB: 1935/07/18  Referred by: [name], DO Reason for referral : Chronic Care Management (Initial outreach call was unsuccessful.) and Chronic Care Management (Second CCM calle was unsuccessful)   A second unsuccessful telephone outreach was attempted today. The patient was referred to the case management team for assistance with chronic care management and care coordination.   Follow Up Plan: The care management team will reach out to the patient again over the next 7 days.   [place]  [email or web address]   ??[phone]

## 2018-11-20 NOTE — Chronic Care Management (AMB) (Signed)
°  Chronic Care Management   Outreach Note  11/20/2018 Name: [name] MRN: [id number] DOB: Nov 10, 1935  Referred by: [name], DO Reason for referral : Chronic Care Management (Initial outreach call was unsuccessful.); Chronic Care Management (Second CCM call was unsuccessful); and Chronic Care Management (Third CCM call was unsuccessful.)   Third unsuccessful telephone outreach was attempted today. The patient was referred to the case management team for assistance with chronic care management and care coordination. The patient's primary care provider has been notified of our unsuccessful attempts to make or maintain contact with the patient. The care management team is pleased to engage with this patient at any time in the future should he/she be interested in assistance from the care management team.   Follow Up Plan: The care management team is available to follow up with the patient after provider conversation with the patient regarding recommendation for care management engagement and subsequent re-referral to the care management team.   [place]  [email or web address]   ??[phone]

## 2018-12-13 DIAGNOSIS — L209 Atopic dermatitis, unspecified: Secondary | ICD-10-CM | POA: Diagnosis not present

## 2018-12-13 DIAGNOSIS — L853 Xerosis cutis: Secondary | ICD-10-CM | POA: Diagnosis not present

## 2018-12-13 DIAGNOSIS — L820 Inflamed seborrheic keratosis: Secondary | ICD-10-CM | POA: Diagnosis not present

## 2018-12-13 DIAGNOSIS — L821 Other seborrheic keratosis: Secondary | ICD-10-CM | POA: Diagnosis not present

## 2018-12-13 DIAGNOSIS — D485 Neoplasm of uncertain behavior of skin: Secondary | ICD-10-CM | POA: Diagnosis not present

## 2018-12-26 ENCOUNTER — Telehealth: Payer: Self-pay | Admitting: Family Medicine

## 2018-12-26 NOTE — Telephone Encounter (Signed)
Pt.called said that he need a new order for PT [facility] in [place] .

## 2018-12-27 NOTE — Telephone Encounter (Signed)
Written signed new [facility] order to [facility]. Ready to be faxed.  Dx bilateral foot drop, gait abnormality  [name], DO [facility] 12/27/2018, 11:55 AM

## 2018-12-27 NOTE — Telephone Encounter (Signed)
The pt was treated and discharged to home exercise back in April 2020 according to [facility] in [place]. If the patient wish to reestablish PT he will need a new order.

## 2019-01-07 DIAGNOSIS — L820 Inflamed seborrheic keratosis: Secondary | ICD-10-CM | POA: Diagnosis not present

## 2019-01-07 DIAGNOSIS — Z85828 Personal history of other malignant neoplasm of skin: Secondary | ICD-10-CM | POA: Diagnosis not present

## 2019-01-07 DIAGNOSIS — L821 Other seborrheic keratosis: Secondary | ICD-10-CM | POA: Diagnosis not present

## 2019-01-07 DIAGNOSIS — C44622 Squamous cell carcinoma of skin of right upper limb, including shoulder: Secondary | ICD-10-CM | POA: Diagnosis not present

## 2019-01-07 DIAGNOSIS — D485 Neoplasm of uncertain behavior of skin: Secondary | ICD-10-CM | POA: Diagnosis not present

## 2019-01-14 DIAGNOSIS — R262 Difficulty in walking, not elsewhere classified: Secondary | ICD-10-CM | POA: Diagnosis not present

## 2019-01-14 DIAGNOSIS — G589 Mononeuropathy, unspecified: Secondary | ICD-10-CM | POA: Diagnosis not present

## 2019-01-14 DIAGNOSIS — M21371 Foot drop, right foot: Secondary | ICD-10-CM | POA: Diagnosis not present

## 2019-01-14 DIAGNOSIS — M5417 Radiculopathy, lumbosacral region: Secondary | ICD-10-CM | POA: Diagnosis not present

## 2019-01-17 DIAGNOSIS — M5417 Radiculopathy, lumbosacral region: Secondary | ICD-10-CM | POA: Diagnosis not present

## 2019-01-17 DIAGNOSIS — M21371 Foot drop, right foot: Secondary | ICD-10-CM | POA: Diagnosis not present

## 2019-01-17 DIAGNOSIS — R262 Difficulty in walking, not elsewhere classified: Secondary | ICD-10-CM | POA: Diagnosis not present

## 2019-01-17 DIAGNOSIS — G589 Mononeuropathy, unspecified: Secondary | ICD-10-CM | POA: Diagnosis not present

## 2019-01-21 DIAGNOSIS — M21371 Foot drop, right foot: Secondary | ICD-10-CM | POA: Diagnosis not present

## 2019-01-21 DIAGNOSIS — M5417 Radiculopathy, lumbosacral region: Secondary | ICD-10-CM | POA: Diagnosis not present

## 2019-01-21 DIAGNOSIS — R262 Difficulty in walking, not elsewhere classified: Secondary | ICD-10-CM | POA: Diagnosis not present

## 2019-01-21 DIAGNOSIS — G589 Mononeuropathy, unspecified: Secondary | ICD-10-CM | POA: Diagnosis not present

## 2019-01-23 DIAGNOSIS — M21371 Foot drop, right foot: Secondary | ICD-10-CM | POA: Diagnosis not present

## 2019-01-23 DIAGNOSIS — G589 Mononeuropathy, unspecified: Secondary | ICD-10-CM | POA: Diagnosis not present

## 2019-01-23 DIAGNOSIS — M5417 Radiculopathy, lumbosacral region: Secondary | ICD-10-CM | POA: Diagnosis not present

## 2019-01-23 DIAGNOSIS — R262 Difficulty in walking, not elsewhere classified: Secondary | ICD-10-CM | POA: Diagnosis not present

## 2019-01-24 DIAGNOSIS — H25093 Other age-related incipient cataract, bilateral: Secondary | ICD-10-CM | POA: Diagnosis not present

## 2019-01-24 DIAGNOSIS — H35033 Hypertensive retinopathy, bilateral: Secondary | ICD-10-CM | POA: Diagnosis not present

## 2019-01-28 DIAGNOSIS — G589 Mononeuropathy, unspecified: Secondary | ICD-10-CM | POA: Diagnosis not present

## 2019-01-28 DIAGNOSIS — M5417 Radiculopathy, lumbosacral region: Secondary | ICD-10-CM | POA: Diagnosis not present

## 2019-01-28 DIAGNOSIS — M21371 Foot drop, right foot: Secondary | ICD-10-CM | POA: Diagnosis not present

## 2019-01-28 DIAGNOSIS — R262 Difficulty in walking, not elsewhere classified: Secondary | ICD-10-CM | POA: Diagnosis not present

## 2019-01-31 DIAGNOSIS — M5417 Radiculopathy, lumbosacral region: Secondary | ICD-10-CM | POA: Diagnosis not present

## 2019-01-31 DIAGNOSIS — M21371 Foot drop, right foot: Secondary | ICD-10-CM | POA: Diagnosis not present

## 2019-01-31 DIAGNOSIS — C44529 Squamous cell carcinoma of skin of other part of trunk: Secondary | ICD-10-CM | POA: Diagnosis not present

## 2019-01-31 DIAGNOSIS — G589 Mononeuropathy, unspecified: Secondary | ICD-10-CM | POA: Diagnosis not present

## 2019-01-31 DIAGNOSIS — L820 Inflamed seborrheic keratosis: Secondary | ICD-10-CM | POA: Diagnosis not present

## 2019-01-31 DIAGNOSIS — R262 Difficulty in walking, not elsewhere classified: Secondary | ICD-10-CM | POA: Diagnosis not present

## 2019-02-01 ENCOUNTER — Other Ambulatory Visit: Payer: Self-pay | Admitting: Family Medicine

## 2019-02-01 ENCOUNTER — Ambulatory Visit
Admission: EM | Admit: 2019-02-01 | Discharge: 2019-02-01 | Disposition: A | Discharge status: Home / Self Care | Payer: Medicare Other | Attending: Family Medicine | Admitting: Family Medicine

## 2019-02-01 ENCOUNTER — Ambulatory Visit: Payer: Medicare Other

## 2019-02-01 ENCOUNTER — Encounter: Payer: Self-pay | Admitting: Emergency Medicine

## 2019-02-01 ENCOUNTER — Other Ambulatory Visit: Payer: Self-pay

## 2019-02-01 DIAGNOSIS — E871 Hypo-osmolality and hyponatremia: Secondary | ICD-10-CM | POA: Diagnosis not present

## 2019-02-01 DIAGNOSIS — E876 Hypokalemia: Secondary | ICD-10-CM | POA: Insufficient documentation

## 2019-02-01 DIAGNOSIS — R0602 Shortness of breath: Secondary | ICD-10-CM | POA: Diagnosis not present

## 2019-02-01 DIAGNOSIS — R6883 Chills (without fever): Secondary | ICD-10-CM | POA: Diagnosis not present

## 2019-02-01 DIAGNOSIS — R8271 Bacteriuria: Secondary | ICD-10-CM | POA: Diagnosis not present

## 2019-02-01 LAB — URINALYSIS, COMPLETE (UACMP) WITH MICROSCOPIC
Bilirubin Urine: NEGATIVE
Glucose, UA: NEGATIVE mg/dL
Ketones, ur: NEGATIVE mg/dL
Leukocytes,Ua: NEGATIVE
Nitrite: NEGATIVE
Protein, ur: NEGATIVE mg/dL
Specific Gravity, Urine: 1.015 (ref 1.005–1.030)
pH: 7 (ref 5.0–8.0)

## 2019-02-01 LAB — CBC WITH DIFFERENTIAL/PLATELET
Abs Immature Granulocytes: 0.02 10*3/uL (ref 0.00–0.07)
Basophils Absolute: 0 10*3/uL (ref 0.0–0.1)
Basophils Relative: 0 %
Eosinophils Absolute: 0.1 10*3/uL (ref 0.0–0.5)
Eosinophils Relative: 1 %
HCT: 34.8 % — ABNORMAL LOW (ref 39.0–52.0)
Hemoglobin: 12.8 g/dL — ABNORMAL LOW (ref 13.0–17.0)
Immature Granulocytes: 0 %
Lymphocytes Relative: 15 %
Lymphs Abs: 1.2 10*3/uL (ref 0.7–4.0)
MCH: 37.6 pg — ABNORMAL HIGH (ref 26.0–34.0)
MCHC: 36.8 g/dL — ABNORMAL HIGH (ref 30.0–36.0)
MCV: 102.4 fL — ABNORMAL HIGH (ref 80.0–100.0)
Monocytes Absolute: 0.4 10*3/uL (ref 0.1–1.0)
Monocytes Relative: 6 %
Neutro Abs: 6.2 10*3/uL (ref 1.7–7.7)
Neutrophils Relative %: 78 %
Platelets: 225 10*3/uL (ref 150–400)
RBC: 3.4 MIL/uL — ABNORMAL LOW (ref 4.22–5.81)
RDW: 12.9 % (ref 11.5–15.5)
WBC: 8 10*3/uL (ref 4.0–10.5)
nRBC: 0 % (ref 0.0–0.2)

## 2019-02-01 LAB — BASIC METABOLIC PANEL
Anion gap: 11 (ref 5–15)
BUN: 14 mg/dL (ref 8–23)
CO2: 26 mmol/L (ref 22–32)
Calcium: 9.3 mg/dL (ref 8.9–10.3)
Chloride: 94 mmol/L — ABNORMAL LOW (ref 98–111)
Creatinine, Ser: 0.73 mg/dL (ref 0.61–1.24)
GFR calc Af Amer: >60 mL/min (ref >60)
GFR calc non Af Amer: >60 mL/min (ref >60)
Glucose, Bld: 113 mg/dL — ABNORMAL HIGH (ref 70–99)
Potassium: 3.4 mmol/L — ABNORMAL LOW (ref 3.5–5.1)
Sodium: 131 mmol/L — ABNORMAL LOW (ref 135–145)

## 2019-02-01 LAB — MAGNESIUM: Magnesium: 1.7 mg/dL (ref 1.7–2.4)

## 2019-02-01 LAB — GLUCOSE, CAPILLARY: Glucose-Capillary: 112 mg/dL — ABNORMAL HIGH (ref 70–99)

## 2019-02-01 MED ORDER — POTASSIUM CHLORIDE CRYS ER 20 MEQ PO TBCR
20.0000 meq | EXTENDED_RELEASE_TABLET | Freq: Once | ORAL | Status: AC
  Administered 2019-02-01: 20 meq via ORAL

## 2019-02-01 MED ORDER — SULFAMETHOXAZOLE-TRIMETHOPRIM 800-160 MG PO TABS: 1.0000 | ORAL_TABLET | Freq: Two times a day (BID) | ORAL | 0 refills | Status: DC

## 2019-02-01 NOTE — ED Provider Notes (Signed)
[facility]    CSN: [id number] Arrival date & time: 02/01/19  1247      History   Chief Complaint Chief Complaint  Patient presents with  . Shortness of Breath  . Shaking    HPI [name] is a 83 y.o. male.   83 yo male with a c/o "shakiness" and slight shortness of breath since this morning. States he ate breakfast this am. Denies any fevers, chills, chest pain, palpitations, wheezing, cough, congestion, numbness/tingling, one-sided weakness. States he has a chronic tremor but just seemed a lot worse today.      Past Medical History:  Diagnosis Date  . Arthritis   . Benign essential tremor   . Fracture, femur ([place]) 2012  . GERD (gastroesophageal reflux disease)   . H/O echocardiogram    05/2013  . Hx of colonoscopy    04/22/2013  . Hypercholesterolemia   . Myocardial infarction ([place])    1987  . Polyneuropathy   . Prostate cancer ([place]) 2004  . Superficial hematoma     Patient Active Problem List   Diagnosis Date Noted  . Osteoarthritis of multiple joints 12/15/2017  . Chronic hyponatremia 03/28/2017  . Abnormality of gait and mobility 01/11/2017  . Cloudy vision 01/11/2017  . Foot drop, bilateral 01/11/2017  . Amputee, great toe, left ([place]) 01/11/2017  . 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 Macrocytic anemia 09/22/2015  . Moderate tricuspid insufficiency 04/21/2015  . History of prostate cancer 04/16/2015  . Idiopathic peripheral neuropathy 04/16/2015  . Essential hypertension 04/16/2015  . GERD (gastroesophageal reflux disease) 04/16/2015  . Arthritis 04/16/2015  . Pedal edema 04/16/2015  . Benign essential tremor 10/30/2014  . Hyperlipidemia, mixed 10/27/2014  . Chronic systolic CHF (congestive heart failure), NYHA class 2 ([place]) 04/23/2014  . Mitral insufficiency 11/05/2013  . CAD (coronary artery disease) 11/01/2013  . Basal cell carcinoma of skin of other parts of face 03/14/2013  . Ankle sprain 09/09/2011  .  History of fibula fracture 09/09/2011    Past Surgical History:  Procedure Laterality Date  . BASAL CELL CARCINOMA EXCISION    . COLONOSCOPY  05/01/2009   Positive for colonic polyps  . COLONOSCOPY WITH PROPOFOL N/A 02/24/2016   Procedure: COLONOSCOPY WITH PROPOFOL;  Surgeon: Christene Lye, MD;  Location: [facility] ENDOSCOPY;  Service: Endoscopy;  Laterality: N/A;  . CORONARY ARTERY BYPASS GRAFT  1987   X2  . HERNIA REPAIR  1990  . PROSTATE SURGERY  2004       Home Medications    Prior to Admission medications   Medication Sig Start Date End Date Taking? Authorizing Provider  Alpha Lipoic Acid 200 MG CAPS Take 2 capsules by mouth daily.    [provider]  amLODipine (NORVASC) 2.5 MG tablet TAKE 1 TABLET BY MOUTH  DAILY 07/23/18   Karamalegos, Devonne Doughty, DO  Apoaequorin (PREVAGEN PO) Take by mouth.    [provider]  aspirin 81 MG tablet Take 81 mg by mouth daily.    [provider]  B Complex Vitamins (VITAMIN-B COMPLEX PO) Take 1 tablet by mouth daily. Reported on 10/06/2015    [provider]  Biotin 1000 MCG tablet Take 1,000 mcg by mouth daily.    [provider]  Calcium Carb-Cholecalciferol 500-100 MG-UNIT CHEW Chew 500 mg by mouth daily.    [provider]  cyanocobalamin 1000 MCG tablet Take 1,000 mcg by mouth daily.    [provider]  folic acid (FOLVITE)  1 MG tablet Take 1 mg by mouth daily.    [provider]  furosemide (LASIX) 20 MG tablet TAKE 1 TABLET BY MOUTH  DAILY 07/23/18   Parks Ranger, Devonne Doughty, DO  gabapentin (NEURONTIN) 600 MG tablet TAKE 2 TABLETS BY MOUTH TWO TIMES DAILY 07/23/18   Parks Ranger, Devonne Doughty, DO  Glucosamine Sulfate 1000 MG CAPS Take 1 capsule by mouth daily.    [provider]  hydrochlorothiazide (HYDRODIURIL) 25 MG tablet TAKE 1 TABLET BY MOUTH  DAILY 09/24/18   Parks Ranger, Devonne Doughty, DO  Lecithin 400 MG CAPS Take 400 mg by mouth daily.    [provider]  losartan (COZAAR) 50 MG tablet TAKE 1 TABLET BY MOUTH  DAILY 07/23/18   Parks Ranger, Devonne Doughty, DO  Melatonin 3 MG CAPS Take 3 mg by mouth at bedtime.    [provider]  meloxicam (MOBIC) 15 MG tablet Take 1 tablet (15 mg total) by mouth daily as needed for pain. For up to 1-2 weeks, then reduce to less frequent for longer if need 03/06/18   Parks Ranger, Alexander J, DO  Omega 3 1000 MG CAPS Take 1,000 mg by mouth daily.    [provider]  omeprazole (PRILOSEC) 20 MG capsule TAKE 1 CAPSULE BY MOUTH TWO TIMES DAILY BEFORE MEALS 07/23/18   Karamalegos, Devonne Doughty, DO  ondansetron (ZOFRAN-ODT) 8 MG disintegrating tablet Take 1 tablet (8 mg total) by mouth every 8 (eight) hours as needed for nausea or vomiting. 03/06/18   Parks Ranger, Devonne Doughty, DO  Potassium Chloride ER 20 MEQ TBCR Take 20 mEq by mouth daily. Take 2 tablets each day 03/06/18   [name], DO  potassium chloride SA (K-DUR) 20 MEQ tablet TAKE 2 TABLETS BY MOUTH  EVERY DAY 02/01/19   Parks Ranger, Devonne Doughty, DO  pravastatin (PRAVACHOL) 80 MG tablet TAKE 1 TABLET BY MOUTH  DAILY 07/23/18   Parks Ranger, Devonne Doughty, DO  primidone (MYSOLINE) 50 MG tablet TAKE 1 TABLET BY MOUTH 3  TIMES DAILY 07/23/18   Parks Ranger, Devonne Doughty, DO  Selenium 200 MCG CAPS Take 200 mcg by mouth daily.    [provider]  sulfamethoxazole-trimethoprim (BACTRIM DS) 800-160 MG tablet Take 1 tablet by mouth 2 (two) times daily. 02/01/19   Norval Gable, MD  tretinoin (RETIN-A) 0.05 % cream Apply 1 application topically at bedtime.    [provider]    Family History Family History  Problem Relation Age of Onset  . Heart disease Mother     Social History Social History   Tobacco Use  . Smoking status: Never Smoker  . Smokeless tobacco: Never Used  Substance Use Topics  . Alcohol use: Yes    Alcohol/week: 0.0 standard drinks    Comment: wine in the evening  . Drug use: No      Allergies   Codeine, Indocin [indomethacin], Latex, Mupirocin, Plavix [clopidogrel bisulfate], Polysporin [bacitracin-polymyxin b], Shellfish allergy, Tape, Neosporin [neomycin-bacitracin zn-polymyx], and Other   Review of Systems Review of Systems   Physical Exam Triage Vital Signs ED Triage Vitals  Enc Vitals Group     BP 02/01/19 1258 (!) 160/94     Pulse Rate 02/01/19 1258 (!) 108     Resp 02/01/19 1258 16     Temp 02/01/19 1258 98.2 F (36.8 C)     Temp Source 02/01/19 1258 Oral     SpO2 02/01/19 1258 99 %     Weight 02/01/19 1257 205 lb (93 kg)  Height 02/01/19 1257 6' (1.829 m)     Head Circumference --      Peak Flow --      Pain Score 02/01/19 1257 0     Pain Loc --      Pain Edu? --      Excl. in [place]? --    No data found.  Updated Vital Signs BP (!) 160/94 (BP Location: Right Arm)   Pulse (!) 108   Temp 98.2 F (36.8 C) (Oral)   Resp 16   Ht 6' (1.829 m)   Wt 93 kg   SpO2 99%   BMI 27.80 kg/m   Visual Acuity Right Eye Distance:   Left Eye Distance:   Bilateral Distance:    Right Eye Near:   Left Eye Near:    Bilateral Near:     Physical Exam Vitals signs and nursing note reviewed.  Constitutional:      General: He is not in acute distress.    Appearance: He is not toxic-appearing or diaphoretic.  Cardiovascular:     Rate and Rhythm: Regular rhythm. Tachycardia present.     Pulses: Normal pulses.     Heart sounds: Normal heart sounds.  Pulmonary:     Effort: Pulmonary effort is normal. No respiratory distress.     Breath sounds: Normal breath sounds. No stridor. No wheezing, rhonchi or rales.  Abdominal:     General: Bowel sounds are normal. There is no distension.     Palpations: Abdomen is soft.  Neurological:     General: No focal deficit present.     Mental Status: He is alert.     Cranial Nerves: No cranial nerve deficit.      UC Treatments / Results  Labs (all labs ordered are listed, but only abnormal results are  displayed) Labs Reviewed  GLUCOSE, CAPILLARY - Abnormal; Notable for the following components:      Result Value   Glucose-Capillary 112 (*)    All other components within normal limits  CBC WITH DIFFERENTIAL/PLATELET - Abnormal; Notable for the following components:   RBC 3.40 (*)    Hemoglobin 12.8 (*)    HCT 34.8 (*)    MCV 102.4 (*)    MCH 37.6 (*)    MCHC 36.8 (*)    All other components within normal limits  BASIC METABOLIC PANEL - Abnormal; Notable for the following components:   Sodium 131 (*)    Potassium 3.4 (*)    Chloride 94 (*)    Glucose, Bld 113 (*)    All other components within normal limits  URINALYSIS, COMPLETE (UACMP) WITH MICROSCOPIC - Abnormal; Notable for the following components:   Hgb urine dipstick TRACE (*)    Bacteria, UA RARE (*)    All other components within normal limits  URINE CULTURE  MAGNESIUM  CBG MONITORING, ED    EKG   Radiology Dg Chest 2 View  Result Date: 02/01/2019 CLINICAL DATA:  Patient woke up shaky with chills this morning. EXAM: CHEST - 2 VIEW COMPARISON:  Oct 16, 2015 FINDINGS: The heart size and mediastinal contours are within normal limits. Both lungs are clear. The visualized skeletal structures are unremarkable. IMPRESSION: No active cardiopulmonary disease. Electronically Signed   By: Dorise Bullion III M.D   On: 02/01/2019 13:46    Procedures ED EKG  Date/Time: 02/01/2019 5:41 PM Performed by: Norval Gable, MD Authorized by: Karen Kitchens, NP   ECG reviewed by ED Physician in the absence of a  cardiologist: yes   Previous ECG:    Previous ECG:  Compared to current   Similarity:  Changes noted Interpretation:    Interpretation: abnormal   Rate:    ECG rate:  102   ECG rate assessment: tachycardic   Rhythm:    Rhythm: sinus tachycardia   Ectopy:    Ectopy: none   QRS:    QRS axis:  Normal Conduction:    Conduction: normal   ST segments:    ST segments:  Normal T waves:    T waves: normal      (including critical care time)  Medications Ordered in UC Medications  potassium chloride SA (K-DUR) CR tablet 20 mEq (20 mEq Oral Given 02/01/19 1440)    Initial Impression / Assessment and Plan / UC Course  I have reviewed the triage vital signs and the nursing notes.  Pertinent labs & imaging results that were available during my care of the patient were reviewed by me and considered in my medical decision making (see chart for details).      Final Clinical Impressions(s) / UC Diagnoses   Final diagnoses:  Bacteriuria  Chills (without fever)  Hyponatremia  Hypokalemia     Discharge Instructions     Follow up on Monday with Primary care provider to recheck electrolyte labs    ED Prescriptions    Medication Sig Dispense Auth. Provider   sulfamethoxazole-trimethoprim (BACTRIM DS) 800-160 MG tablet Take 1 tablet by mouth 2 (two) times daily. 6 tablet Norval Gable, MD      1. Labs/x-ray/ekg results and diagnosis reviewed with patient 2. rx as per orders above; reviewed possible side effects, interactions, risks and benefits 3. Patient given KCL 20MEQ x 1 po  4. Follow up on Monday with PCP for recheck electrolytes 5. Follow-up prn if symptoms worsen or don't improve   Controlled Substance Prescriptions [place] Controlled Substance Registry consulted? Not Applicable   Norval Gable, MD 02/01/19 1745

## 2019-02-01 NOTE — Discharge Instructions (Addendum)
Follow up on Monday with Primary care provider to recheck electrolyte labs

## 2019-02-01 NOTE — ED Triage Notes (Signed)
Patient c/o SOB and shakiness that started this morning.  Patient denies chest pain.  Patient denies any cold symptoms.

## 2019-02-03 LAB — URINE CULTURE: Culture: <10000 — AB

## 2019-02-04 DIAGNOSIS — R262 Difficulty in walking, not elsewhere classified: Secondary | ICD-10-CM | POA: Diagnosis not present

## 2019-02-04 DIAGNOSIS — M21371 Foot drop, right foot: Secondary | ICD-10-CM | POA: Diagnosis not present

## 2019-02-04 DIAGNOSIS — M5417 Radiculopathy, lumbosacral region: Secondary | ICD-10-CM | POA: Diagnosis not present

## 2019-02-04 DIAGNOSIS — G589 Mononeuropathy, unspecified: Secondary | ICD-10-CM | POA: Diagnosis not present

## 2019-02-05 ENCOUNTER — Other Ambulatory Visit: Payer: Self-pay

## 2019-02-05 ENCOUNTER — Ambulatory Visit (INDEPENDENT_AMBULATORY_CARE_PROVIDER_SITE_OTHER): Payer: Medicare Other | Admitting: Family Medicine

## 2019-02-05 ENCOUNTER — Encounter: Payer: Self-pay | Admitting: Family Medicine

## 2019-02-05 VITALS — BP 130/94 | HR 97 | Temp 98.6°F | Resp 16 | Ht 72.0 in | Wt 273.0 lb

## 2019-02-05 DIAGNOSIS — E876 Hypokalemia: Secondary | ICD-10-CM | POA: Diagnosis not present

## 2019-02-05 DIAGNOSIS — R8271 Bacteriuria: Secondary | ICD-10-CM | POA: Diagnosis not present

## 2019-02-05 DIAGNOSIS — I10 Essential (primary) hypertension: Secondary | ICD-10-CM | POA: Diagnosis not present

## 2019-02-05 DIAGNOSIS — M21372 Foot drop, left foot: Secondary | ICD-10-CM | POA: Diagnosis not present

## 2019-02-05 DIAGNOSIS — M21371 Foot drop, right foot: Secondary | ICD-10-CM | POA: Diagnosis not present

## 2019-02-05 DIAGNOSIS — R7309 Other abnormal glucose: Secondary | ICD-10-CM | POA: Diagnosis not present

## 2019-02-05 NOTE — Patient Instructions (Addendum)
Thank you for coming to the office today.  We will write AFO order and fax it to [employer] - stay tuned. Check with them if you want to get a status update.  No more antibiotics needed.  Urine culture looks good, only small amount of bacteria  Mild low potassium as discussed  You can increase to 2 pills one day and then next day 1, alternate each day.  We can check again in next visit.  DUE for FASTING BLOOD WORK (no food or drink after midnight before the lab appointment, only water or coffee without cream/sugar on the morning of)  SCHEDULE "Lab Only" visit in the morning at the clinic for lab draw in 1 - 2 MONTHS   - Make sure Lab Only appointment is at about 1 week before your next appointment, so that results will be available  For Lab Results, once available within 2-3 days of blood draw, you can can log in to MyChart online to view your results and a brief explanation. Also, we can discuss results at next follow-up visit.   Please schedule a Follow-up Appointment to: Return in about 6 weeks (around 03/19/2019) for 4-6 weeks for follow-up medicare, lab results.  If you have any other questions or concerns, please feel free to call the office or send a message through [place]. You may also schedule an earlier appointment if necessary.  Additionally, you may be receiving a survey about your experience at our office within a few days to 1 week by e-mail or mail. We value your feedback.  [name], DO [place]

## 2019-02-05 NOTE — Progress Notes (Signed)
Subjective:    Patient ID: [name], male    DOB: 04-17-1936, 83 y.o.   MRN: [id number]  [name] is a 83 y.o. male presenting on 02/05/2019 for Bacteruria and Foot drop (AFO)   HPI    Urgent CareFOLLOW-UP VISIT  Hospital/Location: [facility] Date of ED Visit: 02/01/19  Reason for Presenting to ED: Chills shaky weaker Primary (+Secondary) Diagnosis: Bacteria UTI  FOLLOW-UP  - ED provider note and record have been reviewed - Patient presents today about 4 days after recent ED visit. Brief summary of recent course, patient had symptoms of chills shaky and weakness, presented to [facility], testing with labs chemistry showed mild low K 3.4, also had CBC unremarkable but chronic anemia, chest x-ray, EKG< urinalysis, urine culture, urine showed trace hgb and leuks, treated with BactrimDS and Potassium, bactrim for 3 days.  - Today reports overall has done well after discharge from ED. Symptoms of shakiness and chills and sweat have resolved. No further UTI symptoms. - he has chronic history of low K in past, on diuretic, he takes 1-2 potassium pills daily in past, still taking 1 now, he is on diuretic  - New medications on discharge: BactrimDS  Bilateral Foot Drop He uses AFO brace Left ankle/foot, he says it broke other day when he accidentally fell while in kitchen he was holding on to counter and didn't have correct footing and slid when bending down. He needs new order rx sent to [facility] for new AFO brace.    Depression screen [facility] 2/9 02/05/2019 09/05/2018 03/06/2018  Decreased Interest 0 0 0  Down, Depressed, Hopeless 0 0 0  PHQ - 2 Score 0 0 0    Social History   Tobacco Use  . Smoking status: Never Smoker  . Smokeless tobacco: Never Used  Substance Use Topics  . Alcohol use: Yes    Alcohol/week: 0.0 standard drinks    Comment: wine in the evening  . Drug use: No    Review of Systems Per HPI unless specifically indicated above      Objective:    BP (!) 130/94   Pulse 97   Temp 98.6 F (37 C) (Oral)   Resp 16   Ht 6' (1.829 m)   Wt 273 lb (123.8 kg)   SpO2 96%   BMI 37.03 kg/m   Wt Readings from Last 3 Encounters:  02/05/19 273 lb (123.8 kg)  02/01/19 205 lb (93 kg)  03/06/18 205 lb (93 kg)    Physical Exam Vitals signs and nursing note reviewed.  Constitutional:      General: He is not in acute distress.    Appearance: He is well-developed. He is not diaphoretic.     Comments: Well-appearing, comfortable, cooperative  HENT:     Head: Normocephalic and atraumatic.  Eyes:     General:        Right eye: No discharge.        Left eye: No discharge.     Conjunctiva/sclera: Conjunctivae normal.  Cardiovascular:     Rate and Rhythm: Normal rate.  Pulmonary:     Effort: Pulmonary effort is normal.  Musculoskeletal:     Comments: AFO Bilateral Lower Leg Braces in place, using Cane for ambulation assistance  Skin:    General: Skin is warm and dry.     Findings: No erythema or rash.  Neurological:     Mental Status: He is alert and oriented to person, place, and time.  Psychiatric:  Behavior: Behavior normal.     Comments: Well groomed, good eye contact, normal speech and thoughts      I have personally reviewed the radiology report from Chest X-ray 02/01/19 from [facility]  DG Chest 2 ViewPerformed 02/01/2019 Final result  Study Result CLINICAL DATA: Patient woke up shaky with chills this morning.  EXAM: CHEST - 2 VIEW  COMPARISON: Oct 16, 2015  FINDINGS: The heart size and mediastinal contours are within normal limits. Both lungs are clear. The visualized skeletal structures are unremarkable.  IMPRESSION: No active cardiopulmonary disease.   Electronically Signed By: Dorise Bullion III M.D On: 02/01/2019 13:46  -----   Urine culture (Order [id number]) Contains abnormal data Urine culture Order: [id number] Status:  Final result Visible to patient:  Yes (MyChart) Next appt:  None  Specimen Information: Urine, Clean Catch     Component 4d ago  Specimen Description URINE, CLEAN CATCH  Performed at [facility], [address] [place], [place] [address]   Special Requests NONE   Culture Abnormal  <10,000 COLONIES/mL INSIGNIFICANT GROWTH  Performed at [facility], [place] [address], [place], [place] [address]           Results for orders placed or performed in visit on 02/05/19  Microalbumin, urine  Result Value Ref Range   Microalb, Ur 1.2   CBC and differential  Result Value Ref Range   Hemoglobin 12.5 (A) 13.5 - 17.5   HCT 39 (A) 41 - 53   Platelets 256 150 - 399   WBC 4.0   Basic metabolic panel  Result Value Ref Range   Glucose 110    BUN 12 4 - 21   Creatinine 0.7 0.6 - 1.3   Potassium 3.9 3.4 - 5.3   Sodium 133 (A) 137 - 147  Lipid panel  Result Value Ref Range   Triglycerides 71 40 - 160   Cholesterol 165 0 - 200   HDL 90 (A) 35 - 70   LDL Cholesterol 61   Hepatic function panel  Result Value Ref Range   Alkaline Phosphatase 64 25 - 125   ALT 23 10 - 40   AST 25 14 - 40   Bilirubin, Total 0.6       Assessment & Plan:   Problem List Items Addressed This Visit    Essential hypertension   Relevant Medications   Potassium Chloride ER 20 MEQ TBCR   Foot drop, bilateral - Primary    Other Visit Diagnoses    Bacteriuria       Hypokalemia          #HTN / HypoK Lab was mild low K 3.4 on 8/21, he has chronic history of mild low k is on potassium, also on diuretic Advised him to increase K supplement from 83mEq x 1 daily up to 2 pills one day then next day 1 pill alternating Can re-check as planned in 1-2 months with next lab panel, will order BMET, since had full labs already in [place] 10/2018  #bacteruria Without clinical evidence of UTI, his symptoms resolved w/ short high dose bactrim course per urgent care, now resolved s/p 3 days antibiotic. Urine culture insignificant growth see lab result - reassurance  today, monitoring, no antibiotics further  #Footdrop, chronic Chronic problem secondary to chronic idiopathic neuropathy limiting his function and ambulation without braces, uses to improve ambulation along with assistance device cane for weightbearing ambulation.  New rx handwritten AFO brace Left written, to be faxed  to [facility]  Ph [phone] Fax [phone]  No orders of the defined types were placed in this encounter.     Follow up plan: Return in about 6 weeks (around 03/19/2019) for 4-6 weeks for follow-up medicare, lab results.   [name], DO [facility] 02/05/2019, 1:42 PM

## 2019-02-07 DIAGNOSIS — M21371 Foot drop, right foot: Secondary | ICD-10-CM | POA: Diagnosis not present

## 2019-02-07 DIAGNOSIS — M5417 Radiculopathy, lumbosacral region: Secondary | ICD-10-CM | POA: Diagnosis not present

## 2019-02-07 DIAGNOSIS — G589 Mononeuropathy, unspecified: Secondary | ICD-10-CM | POA: Diagnosis not present

## 2019-02-07 DIAGNOSIS — R262 Difficulty in walking, not elsewhere classified: Secondary | ICD-10-CM | POA: Diagnosis not present

## 2019-02-11 DIAGNOSIS — R262 Difficulty in walking, not elsewhere classified: Secondary | ICD-10-CM | POA: Diagnosis not present

## 2019-02-11 DIAGNOSIS — M21371 Foot drop, right foot: Secondary | ICD-10-CM | POA: Diagnosis not present

## 2019-02-11 DIAGNOSIS — G589 Mononeuropathy, unspecified: Secondary | ICD-10-CM | POA: Diagnosis not present

## 2019-02-11 DIAGNOSIS — M5417 Radiculopathy, lumbosacral region: Secondary | ICD-10-CM | POA: Diagnosis not present

## 2019-02-14 DIAGNOSIS — M5417 Radiculopathy, lumbosacral region: Secondary | ICD-10-CM | POA: Diagnosis not present

## 2019-02-14 DIAGNOSIS — M21371 Foot drop, right foot: Secondary | ICD-10-CM | POA: Diagnosis not present

## 2019-02-14 DIAGNOSIS — G589 Mononeuropathy, unspecified: Secondary | ICD-10-CM | POA: Diagnosis not present

## 2019-02-14 DIAGNOSIS — R262 Difficulty in walking, not elsewhere classified: Secondary | ICD-10-CM | POA: Diagnosis not present

## 2019-02-19 DIAGNOSIS — M21371 Foot drop, right foot: Secondary | ICD-10-CM | POA: Diagnosis not present

## 2019-02-19 DIAGNOSIS — G589 Mononeuropathy, unspecified: Secondary | ICD-10-CM | POA: Diagnosis not present

## 2019-02-19 DIAGNOSIS — R262 Difficulty in walking, not elsewhere classified: Secondary | ICD-10-CM | POA: Diagnosis not present

## 2019-02-19 DIAGNOSIS — M5417 Radiculopathy, lumbosacral region: Secondary | ICD-10-CM | POA: Diagnosis not present

## 2019-02-21 DIAGNOSIS — M21371 Foot drop, right foot: Secondary | ICD-10-CM | POA: Diagnosis not present

## 2019-02-21 DIAGNOSIS — G589 Mononeuropathy, unspecified: Secondary | ICD-10-CM | POA: Diagnosis not present

## 2019-02-21 DIAGNOSIS — M5417 Radiculopathy, lumbosacral region: Secondary | ICD-10-CM | POA: Diagnosis not present

## 2019-02-21 DIAGNOSIS — R262 Difficulty in walking, not elsewhere classified: Secondary | ICD-10-CM | POA: Diagnosis not present

## 2019-02-25 DIAGNOSIS — R262 Difficulty in walking, not elsewhere classified: Secondary | ICD-10-CM | POA: Diagnosis not present

## 2019-02-25 DIAGNOSIS — M5417 Radiculopathy, lumbosacral region: Secondary | ICD-10-CM | POA: Diagnosis not present

## 2019-02-25 DIAGNOSIS — M21371 Foot drop, right foot: Secondary | ICD-10-CM | POA: Diagnosis not present

## 2019-02-25 DIAGNOSIS — G589 Mononeuropathy, unspecified: Secondary | ICD-10-CM | POA: Diagnosis not present

## 2019-02-28 DIAGNOSIS — M21371 Foot drop, right foot: Secondary | ICD-10-CM | POA: Diagnosis not present

## 2019-02-28 DIAGNOSIS — G589 Mononeuropathy, unspecified: Secondary | ICD-10-CM | POA: Diagnosis not present

## 2019-02-28 DIAGNOSIS — R262 Difficulty in walking, not elsewhere classified: Secondary | ICD-10-CM | POA: Diagnosis not present

## 2019-02-28 DIAGNOSIS — M5417 Radiculopathy, lumbosacral region: Secondary | ICD-10-CM | POA: Diagnosis not present

## 2019-03-04 DIAGNOSIS — R262 Difficulty in walking, not elsewhere classified: Secondary | ICD-10-CM | POA: Diagnosis not present

## 2019-03-04 DIAGNOSIS — M5417 Radiculopathy, lumbosacral region: Secondary | ICD-10-CM | POA: Diagnosis not present

## 2019-03-04 DIAGNOSIS — G589 Mononeuropathy, unspecified: Secondary | ICD-10-CM | POA: Diagnosis not present

## 2019-03-04 DIAGNOSIS — M21371 Foot drop, right foot: Secondary | ICD-10-CM | POA: Diagnosis not present

## 2019-03-07 DIAGNOSIS — M21371 Foot drop, right foot: Secondary | ICD-10-CM | POA: Diagnosis not present

## 2019-03-07 DIAGNOSIS — R262 Difficulty in walking, not elsewhere classified: Secondary | ICD-10-CM | POA: Diagnosis not present

## 2019-03-07 DIAGNOSIS — G589 Mononeuropathy, unspecified: Secondary | ICD-10-CM | POA: Diagnosis not present

## 2019-03-07 DIAGNOSIS — M5417 Radiculopathy, lumbosacral region: Secondary | ICD-10-CM | POA: Diagnosis not present

## 2019-03-11 DIAGNOSIS — R262 Difficulty in walking, not elsewhere classified: Secondary | ICD-10-CM | POA: Diagnosis not present

## 2019-03-11 DIAGNOSIS — M5417 Radiculopathy, lumbosacral region: Secondary | ICD-10-CM | POA: Diagnosis not present

## 2019-03-11 DIAGNOSIS — M21371 Foot drop, right foot: Secondary | ICD-10-CM | POA: Diagnosis not present

## 2019-03-11 DIAGNOSIS — G589 Mononeuropathy, unspecified: Secondary | ICD-10-CM | POA: Diagnosis not present

## 2019-03-14 DIAGNOSIS — M5417 Radiculopathy, lumbosacral region: Secondary | ICD-10-CM | POA: Diagnosis not present

## 2019-03-14 DIAGNOSIS — M21371 Foot drop, right foot: Secondary | ICD-10-CM | POA: Diagnosis not present

## 2019-03-14 DIAGNOSIS — R262 Difficulty in walking, not elsewhere classified: Secondary | ICD-10-CM | POA: Diagnosis not present

## 2019-03-14 DIAGNOSIS — G589 Mononeuropathy, unspecified: Secondary | ICD-10-CM | POA: Diagnosis not present

## 2019-03-15 ENCOUNTER — Other Ambulatory Visit: Payer: Medicare Other

## 2019-03-15 DIAGNOSIS — I10 Essential (primary) hypertension: Secondary | ICD-10-CM | POA: Diagnosis not present

## 2019-03-15 DIAGNOSIS — R7309 Other abnormal glucose: Secondary | ICD-10-CM | POA: Diagnosis not present

## 2019-03-16 LAB — BASIC METABOLIC PANEL WITH GFR
BUN: 12 mg/dL (ref 7–25)
CO2: 28 mmol/L (ref 20–32)
Calcium: 9.6 mg/dL (ref 8.6–10.3)
Chloride: 93 mmol/L — ABNORMAL LOW (ref 98–110)
Creat: 0.8 mg/dL (ref 0.70–1.11)
GFR, Est African American: 97 mL/min/{1.73_m2} (ref >=60)
GFR, Est Non African American: 84 mL/min/{1.73_m2} (ref >=60)
Glucose, Bld: 98 mg/dL (ref 65–99)
Potassium: 3.8 mmol/L (ref 3.5–5.3)
Sodium: 133 mmol/L — ABNORMAL LOW (ref 135–146)

## 2019-03-16 LAB — HEMOGLOBIN A1C
Hgb A1c MFr Bld: 4.7 % of total Hgb (ref <5.7)
Mean Plasma Glucose: 88 (calc)
eAG (mmol/L): 4.9 (calc)

## 2019-03-18 DIAGNOSIS — M21371 Foot drop, right foot: Secondary | ICD-10-CM | POA: Diagnosis not present

## 2019-03-18 DIAGNOSIS — M5417 Radiculopathy, lumbosacral region: Secondary | ICD-10-CM | POA: Diagnosis not present

## 2019-03-18 DIAGNOSIS — G589 Mononeuropathy, unspecified: Secondary | ICD-10-CM | POA: Diagnosis not present

## 2019-03-18 DIAGNOSIS — R262 Difficulty in walking, not elsewhere classified: Secondary | ICD-10-CM | POA: Diagnosis not present

## 2019-03-20 ENCOUNTER — Other Ambulatory Visit: Payer: Self-pay

## 2019-03-20 ENCOUNTER — Encounter: Payer: Self-pay | Admitting: Family Medicine

## 2019-03-20 ENCOUNTER — Ambulatory Visit (INDEPENDENT_AMBULATORY_CARE_PROVIDER_SITE_OTHER): Payer: Medicare Other | Admitting: Family Medicine

## 2019-03-20 VITALS — BP 124/70 | HR 99 | Temp 98.4°F | Ht 72.0 in | Wt 202.0 lb

## 2019-03-20 DIAGNOSIS — G609 Hereditary and idiopathic neuropathy, unspecified: Secondary | ICD-10-CM | POA: Diagnosis not present

## 2019-03-20 DIAGNOSIS — Z23 Encounter for immunization: Secondary | ICD-10-CM | POA: Diagnosis not present

## 2019-03-20 DIAGNOSIS — E876 Hypokalemia: Secondary | ICD-10-CM | POA: Diagnosis not present

## 2019-03-20 DIAGNOSIS — I10 Essential (primary) hypertension: Secondary | ICD-10-CM

## 2019-03-20 DIAGNOSIS — Z89412 Acquired absence of left great toe: Secondary | ICD-10-CM | POA: Diagnosis not present

## 2019-03-20 DIAGNOSIS — I5022 Chronic systolic (congestive) heart failure: Secondary | ICD-10-CM | POA: Diagnosis not present

## 2019-03-20 DIAGNOSIS — E871 Hypo-osmolality and hyponatremia: Secondary | ICD-10-CM | POA: Diagnosis not present

## 2019-03-20 NOTE — Patient Instructions (Addendum)
Thank you for coming to the office today.  Printed results Normal potassium Improved sodium Normal sugar  Keep on current meds, BP improved  DUE for \\FASTING  BLOOD WORK \(no food or drink after midnight before the lab appointment, only water or coffee without cream/sugar on the morning of)  SCHEDULE "Lab Only" visit in the morning at the clinic for lab draw in 6 MONTHS   - Make sure Lab Only appointment is at about 1 week before your next appointment, so that results will be available  For Lab Results, once available within 2-3 days of blood draw, you can can log in to MyChart online to view your results and a brief explanation. Also, we can discuss results at next follow-up visit.   Please schedule a Follow-up Appointment to: Return in about 6 months (around 09/18/2019) for 6 month follow-up Medicare checkup- HTN, HypoNa, Footdrop.  If you have any other questions or concerns, please feel free to call the office or send a message through [place]. You may also schedule an earlier appointment if necessary.  Additionally, you may be receiving a survey about your experience at our office within a few days to 1 week by e-mail or mail. We value your feedback.  [name], DO [place]

## 2019-03-20 NOTE — Progress Notes (Signed)
Subjective:    Patient ID: [name], male    DOB: [birth year], 83 y.o.   MRN: [id number]  [name] is a 83 y.o. male presenting on 03/20/2019 for Hypertension (follow up after lab work)   HPI  FOLLOW-UP Hypokalemia - Last visit with me 02/05/19, for same problem, treated with increased dose of potassium, see prior notes for background information. - Interval update with improvement on current K dosing now, last lab normalized K 3.8 up from 3.4 - Today patient reports doing well on K 59mEq x 2 one day then next day x 1 - alternating dose  CHRONIC HYPONATREMIA / Hypertension / CHF Systolic Last result [id number], improved from 131. Chronic problem. - Cr normal range - Continues to take HCTZ 25mg  daily, Amlodipine 2.5mg , Losartan 50 - On Lasix Diuretic - Other electrolyte imbalance with concern low K, which has improved, now on higher dose K  Bilateral Foot Drop / Chronic Neuropathy He uses AFO brace Left ankle/foot for stability with ambulation. From [employer] Neuropathy, no sensation below knees PT improving 2 x week, helping core str but not balance - interested in further treatment in future maybe refer  Health Maintenance: Due for Flu Shot, will receive today   Depression screen [facility] 2/9 03/20/2019 02/05/2019 09/05/2018  Decreased Interest 0 0 0  Down, Depressed, Hopeless 0 0 0  PHQ - 2 Score 0 0 0    Social History   Tobacco Use  . Smoking status: Never Smoker  . Smokeless tobacco: Never Used  Substance Use Topics  . Alcohol use: Yes    Alcohol/week: 0.0 standard drinks    Comment: wine in the evening  . Drug use: No    Review of Systems Per HPI unless specifically indicated above     Objective:    BP 124/70 (BP Location: Left Arm, Cuff Size: Normal)   Pulse 99   Temp 98.4 F (36.9 C)   Ht 6' (1.829 m)   Wt 202 lb (91.6 kg)   BMI 27.40 kg/m   Wt Readings from Last 3 Encounters:  03/20/19 202 lb (91.6 kg)  02/05/19 273 lb (123.8 kg)  02/01/19 205 lb (93 kg)     Physical Exam Vitals signs and nursing note reviewed.  Constitutional:      General: He is not in acute distress.    Appearance: He is well-developed. He is not diaphoretic.     Comments: Well-appearing, comfortable, cooperative  HENT:     Head: Normocephalic and atraumatic.  Eyes:     General:        Right eye: No discharge.        Left eye: No discharge.     Conjunctiva/sclera: Conjunctivae normal.  Cardiovascular:     Rate and Rhythm: Normal rate.  Pulmonary:     Effort: Pulmonary effort is normal.  Musculoskeletal:     Comments: AFO Bilateral Lower Leg Braces in place, using Cane for ambulation assistance  Skin:    General: Skin is warm and dry.     Findings: Bruising (right antecubital fossa recently from blood draw, no hematoma) present. No erythema or rash.  Neurological:     Mental Status: He is alert and oriented to person, place, and time.  Psychiatric:        Behavior: Behavior normal.     Comments: Well groomed, good eye contact, normal speech and thoughts        Results for orders placed or performed in visit on 03/15/19  Hemoglobin  A1c  Result Value Ref Range   Hgb A1c MFr Bld 4.7 <5.7 % of total Hgb   Mean Plasma Glucose 88 (calc)   eAG (mmol/L) 4.9 (calc)  BASIC METABOLIC PANEL WITH GFR  Result Value Ref Range   Glucose, Bld 98 65 - 99 mg/dL   BUN 12 7 - 25 mg/dL   Creat 0.80 0.70 - 1.11 mg/dL   GFR, Est Non African American 84 > OR = 60 mL/min/1.96m2   GFR, Est African American 97 > OR = 60 mL/min/1.29m2   BUN/Creatinine Ratio NOT APPLICABLE 6 - 22 (calc)   Sodium 133 (L) 135 - 146 mmol/L   Potassium 3.8 3.5 - 5.3 mmol/L   Chloride 93 (L) 98 - 110 mmol/L   CO2 28 20 - 32 mmol/L   Calcium 9.6 8.6 - 10.3 mg/dL      Assessment & Plan:   Problem List Items Addressed This Visit    Amputee, great toe, left (HCC)    Stable S/p L great toe MTP amputation 2018      Chronic hyponatremia    Stable range Na 131-133 Remains on HCTZ and lasix K  supplement Continue improved fluid intake      Chronic systolic CHF (congestive heart failure), NYHA class 2 (HCC)    Stable chronic CHF, remains euvolemic Continues med management for fluid with Lasix 20mg  daily, and K supplement increased now On ARB Followed by Cardiology      Essential hypertension - Primary    Well-controlled HTN - Home BP readings normal  No known complications Followed by Cardiology [facility]  Plan:  1. Continue current BP regimen - Losartan, HCTZ, Amlodipine, Lasix, K supplement (increased) 2. Encourage improved lifestyle - low sodium diet, regular exercise 3. Continue monitor BP outside office, bring readings to next visit, if persistently >140/90 or new symptoms notify office sooner 4. Follow-up q 6 mo      Idiopathic peripheral neuropathy    Chronic problem, bilateral distal extremities, associated with some joint deformity. See A&P       Other Visit Diagnoses    Needs flu shot       Relevant Orders   Flu Vaccine QUAD High Dose(Fluad) (Completed)   Hypokalemia        Resolved HypoK on supplement dose changed 20 then 10 alternating   No orders of the defined types were placed in this encounter.   Follow up plan: Return in about 6 months (around 09/18/2019) for 6 month follow-up Medicare checkup- HTN, HypoNa, Footdrop.  Future labs ordered for 09/2019 full panel  Marcus Putnam, DO [facility] 03/20/2019, 1:36 PM

## 2019-03-21 ENCOUNTER — Other Ambulatory Visit: Payer: Self-pay | Admitting: Family Medicine

## 2019-03-21 DIAGNOSIS — E782 Mixed hyperlipidemia: Secondary | ICD-10-CM

## 2019-03-21 DIAGNOSIS — G589 Mononeuropathy, unspecified: Secondary | ICD-10-CM | POA: Diagnosis not present

## 2019-03-21 DIAGNOSIS — I739 Peripheral vascular disease, unspecified: Secondary | ICD-10-CM | POA: Diagnosis not present

## 2019-03-21 DIAGNOSIS — D539 Nutritional anemia, unspecified: Secondary | ICD-10-CM

## 2019-03-21 DIAGNOSIS — M21371 Foot drop, right foot: Secondary | ICD-10-CM | POA: Diagnosis not present

## 2019-03-21 DIAGNOSIS — E871 Hypo-osmolality and hyponatremia: Secondary | ICD-10-CM

## 2019-03-21 DIAGNOSIS — M159 Polyosteoarthritis, unspecified: Secondary | ICD-10-CM

## 2019-03-21 DIAGNOSIS — Z8546 Personal history of malignant neoplasm of prostate: Secondary | ICD-10-CM

## 2019-03-21 DIAGNOSIS — Z89412 Acquired absence of left great toe: Secondary | ICD-10-CM | POA: Diagnosis not present

## 2019-03-21 DIAGNOSIS — R262 Difficulty in walking, not elsewhere classified: Secondary | ICD-10-CM | POA: Diagnosis not present

## 2019-03-21 DIAGNOSIS — M8949 Other hypertrophic osteoarthropathy, multiple sites: Secondary | ICD-10-CM

## 2019-03-21 DIAGNOSIS — I10 Essential (primary) hypertension: Secondary | ICD-10-CM

## 2019-03-21 DIAGNOSIS — I5022 Chronic systolic (congestive) heart failure: Secondary | ICD-10-CM

## 2019-03-21 DIAGNOSIS — M5417 Radiculopathy, lumbosacral region: Secondary | ICD-10-CM | POA: Diagnosis not present

## 2019-03-21 DIAGNOSIS — R7309 Other abnormal glucose: Secondary | ICD-10-CM

## 2019-03-21 NOTE — Assessment & Plan Note (Signed)
Well-controlled HTN - Home BP readings normal  No known complications Followed by Cardiology [facility]  Plan:  1. Continue current BP regimen - Losartan, HCTZ, Amlodipine, Lasix, K supplement (increased) 2. Encourage improved lifestyle - low sodium diet, regular exercise 3. Continue monitor BP outside office, bring readings to next visit, if persistently >140/90 or new symptoms notify office sooner 4. Follow-up q 6 mo

## 2019-03-21 NOTE — Assessment & Plan Note (Addendum)
Stable range Na 131-133 Remains on HCTZ and lasix K supplement Continue improved fluid intake

## 2019-03-21 NOTE — Assessment & Plan Note (Signed)
Chronic problem, bilateral distal extremities, associated with some joint deformity. See A&P

## 2019-03-21 NOTE — Assessment & Plan Note (Signed)
Stable chronic CHF, remains euvolemic Continues med management for fluid with Lasix 20mg  daily, and K supplement increased now On ARB Followed by Cardiology

## 2019-03-21 NOTE — Assessment & Plan Note (Signed)
Stable S/p L great toe MTP amputation 2018

## 2019-03-22 DIAGNOSIS — H9190 Unspecified hearing loss, unspecified ear: Secondary | ICD-10-CM | POA: Diagnosis not present

## 2019-03-22 DIAGNOSIS — H6121 Impacted cerumen, right ear: Secondary | ICD-10-CM | POA: Diagnosis not present

## 2019-03-25 DIAGNOSIS — G589 Mononeuropathy, unspecified: Secondary | ICD-10-CM | POA: Diagnosis not present

## 2019-03-25 DIAGNOSIS — R262 Difficulty in walking, not elsewhere classified: Secondary | ICD-10-CM | POA: Diagnosis not present

## 2019-03-25 DIAGNOSIS — M5417 Radiculopathy, lumbosacral region: Secondary | ICD-10-CM | POA: Diagnosis not present

## 2019-03-25 DIAGNOSIS — M21371 Foot drop, right foot: Secondary | ICD-10-CM | POA: Diagnosis not present

## 2019-03-28 DIAGNOSIS — M5417 Radiculopathy, lumbosacral region: Secondary | ICD-10-CM | POA: Diagnosis not present

## 2019-03-28 DIAGNOSIS — M21371 Foot drop, right foot: Secondary | ICD-10-CM | POA: Diagnosis not present

## 2019-03-28 DIAGNOSIS — R262 Difficulty in walking, not elsewhere classified: Secondary | ICD-10-CM | POA: Diagnosis not present

## 2019-03-28 DIAGNOSIS — G589 Mononeuropathy, unspecified: Secondary | ICD-10-CM | POA: Diagnosis not present

## 2019-04-02 DIAGNOSIS — L820 Inflamed seborrheic keratosis: Secondary | ICD-10-CM | POA: Diagnosis not present

## 2019-04-02 DIAGNOSIS — Z85828 Personal history of other malignant neoplasm of skin: Secondary | ICD-10-CM | POA: Diagnosis not present

## 2019-04-02 DIAGNOSIS — L821 Other seborrheic keratosis: Secondary | ICD-10-CM | POA: Diagnosis not present

## 2019-04-02 DIAGNOSIS — L570 Actinic keratosis: Secondary | ICD-10-CM | POA: Diagnosis not present

## 2019-04-03 DIAGNOSIS — R262 Difficulty in walking, not elsewhere classified: Secondary | ICD-10-CM | POA: Diagnosis not present

## 2019-04-03 DIAGNOSIS — M5417 Radiculopathy, lumbosacral region: Secondary | ICD-10-CM | POA: Diagnosis not present

## 2019-04-03 DIAGNOSIS — M21371 Foot drop, right foot: Secondary | ICD-10-CM | POA: Diagnosis not present

## 2019-04-03 DIAGNOSIS — G589 Mononeuropathy, unspecified: Secondary | ICD-10-CM | POA: Diagnosis not present

## 2019-04-21 ENCOUNTER — Other Ambulatory Visit: Payer: Self-pay | Admitting: Family Medicine

## 2019-04-21 DIAGNOSIS — I10 Essential (primary) hypertension: Secondary | ICD-10-CM

## 2019-04-21 DIAGNOSIS — K219 Gastro-esophageal reflux disease without esophagitis: Secondary | ICD-10-CM

## 2019-04-21 DIAGNOSIS — E782 Mixed hyperlipidemia: Secondary | ICD-10-CM

## 2019-04-21 DIAGNOSIS — G609 Hereditary and idiopathic neuropathy, unspecified: Secondary | ICD-10-CM

## 2019-04-21 DIAGNOSIS — G250 Essential tremor: Secondary | ICD-10-CM

## 2019-04-30 DIAGNOSIS — Z6828 Body mass index (BMI) 28.0-28.9, adult: Secondary | ICD-10-CM | POA: Diagnosis not present

## 2019-04-30 DIAGNOSIS — R58 Hemorrhage, not elsewhere classified: Secondary | ICD-10-CM | POA: Diagnosis not present

## 2019-04-30 DIAGNOSIS — L282 Other prurigo: Secondary | ICD-10-CM | POA: Diagnosis not present

## 2019-04-30 DIAGNOSIS — I10 Essential (primary) hypertension: Secondary | ICD-10-CM | POA: Diagnosis not present

## 2019-04-30 DIAGNOSIS — Z23 Encounter for immunization: Secondary | ICD-10-CM | POA: Diagnosis not present

## 2019-04-30 DIAGNOSIS — Z0000 Encounter for general adult medical examination without abnormal findings: Secondary | ICD-10-CM | POA: Diagnosis not present

## 2019-04-30 DIAGNOSIS — Z1331 Encounter for screening for depression: Secondary | ICD-10-CM | POA: Diagnosis not present

## 2019-05-06 ENCOUNTER — Telehealth: Payer: Self-pay | Admitting: Family Medicine

## 2019-05-06 NOTE — Chronic Care Management (AMB) (Signed)
°  Chronic Care Management   Outreach Note  05/06/2019 Name: [name] MRN: [id number] DOB: [birth year]  Referred by: [name], DO Reason for referral : Chronic Care Management (Initial CCM outreach was unsuccessful. )   An unsuccessful telephone outreach was attempted today. The patient was referred to the case management team by for assistance with care management and care coordination.   Follow Up Plan: A HIPPA compliant phone message was left for the patient providing contact information and requesting a return call.  The care management team will reach out to the patient again over the next 7 days.  If patient returns call to provider office, please advise to call [place] at [place], [place] [facility]  [place], [place] [address] Direct Dial: [email or web address]  Website: [email or web address]

## 2019-05-08 NOTE — Chronic Care Management (AMB) (Signed)
°  Chronic Care Management   Outreach Note  05/08/2019 Name: [name] MRN: [id number] DOB: August 15, 1935  Referred by: [name], DO Reason for referral : Chronic Care Management (Initial CCM outreach was unsuccessful. ) and Chronic Care Management (Second CCM outreach was unsuccessful. )   A second unsuccessful telephone outreach was attempted today. The patient was referred to the case management team for assistance with care management and care coordination.   Follow Up Plan: A HIPPA compliant phone message was left for the patient providing contact information and requesting a return call.  The care management team will reach out to the patient again over the next 7 days.  If patient returns call to provider office, please advise to call [place] at [place], [place] [facility]  [place], [place] [address] Direct Dial: [email or web address]  Website: [email or web address]

## 2019-05-21 NOTE — Chronic Care Management (AMB) (Signed)
°  Chronic Care Management   Outreach Note  05/21/2019 Name: [name] MRN: [id number] DOB: 02/10/1936  Referred by: [name], DO Reason for referral : Chronic Care Management (Initial CCM outreach was unsuccessful. ), Chronic Care Management (Second CCM outreach was unsuccessful. ), and Chronic Care Management (Third CCM outreach was unsuccessful. )   Third unsuccessful telephone outreach was attempted today. The patient was referred to the case management team for assistance with care management and care coordination. The patient's primary care provider has been notified of our unsuccessful attempts to make or maintain contact with the patient. The care management team is pleased to engage with this patient at any time in the future should he/she be interested in assistance from the care management team.   Follow Up Plan: The care management team is available to follow up with the patient after provider conversation with the patient regarding recommendation for care management engagement and subsequent re-referral to the care management team.    [place], [place] Management  [place], [place] [address] Direct Dial: [email or web address]  Website: [email or web address]

## 2019-05-31 ENCOUNTER — Telehealth: Payer: Self-pay | Admitting: Family Medicine

## 2019-05-31 NOTE — Telephone Encounter (Signed)
I left a message asking the patient to call and schedule AWV with Tiffany. Last AWV 04/17/15 so patient can be scheduled at next available opening. VDM (DD)

## 2019-06-24 DIAGNOSIS — Z6827 Body mass index (BMI) 27.0-27.9, adult: Secondary | ICD-10-CM | POA: Diagnosis not present

## 2019-06-24 DIAGNOSIS — L03116 Cellulitis of left lower limb: Secondary | ICD-10-CM | POA: Diagnosis not present

## 2019-06-24 DIAGNOSIS — M79672 Pain in left foot: Secondary | ICD-10-CM | POA: Diagnosis not present

## 2019-06-25 DIAGNOSIS — E876 Hypokalemia: Secondary | ICD-10-CM | POA: Diagnosis not present

## 2019-06-25 DIAGNOSIS — Z6828 Body mass index (BMI) 28.0-28.9, adult: Secondary | ICD-10-CM | POA: Diagnosis not present

## 2019-06-25 DIAGNOSIS — R600 Localized edema: Secondary | ICD-10-CM | POA: Diagnosis not present

## 2019-06-25 DIAGNOSIS — L03119 Cellulitis of unspecified part of limb: Secondary | ICD-10-CM | POA: Diagnosis not present

## 2019-07-08 DIAGNOSIS — L97521 Non-pressure chronic ulcer of other part of left foot limited to breakdown of skin: Secondary | ICD-10-CM | POA: Diagnosis not present

## 2019-07-08 DIAGNOSIS — Z6827 Body mass index (BMI) 27.0-27.9, adult: Secondary | ICD-10-CM | POA: Diagnosis not present

## 2019-07-08 DIAGNOSIS — L03116 Cellulitis of left lower limb: Secondary | ICD-10-CM | POA: Diagnosis not present

## 2019-07-11 ENCOUNTER — Other Ambulatory Visit: Payer: Self-pay | Admitting: Family Medicine

## 2019-07-11 DIAGNOSIS — Z79899 Other long term (current) drug therapy: Secondary | ICD-10-CM | POA: Diagnosis not present

## 2019-07-11 DIAGNOSIS — L03113 Cellulitis of right upper limb: Secondary | ICD-10-CM | POA: Diagnosis not present

## 2019-07-11 DIAGNOSIS — S50811A Abrasion of right forearm, initial encounter: Secondary | ICD-10-CM | POA: Diagnosis not present

## 2019-07-11 DIAGNOSIS — I10 Essential (primary) hypertension: Secondary | ICD-10-CM | POA: Diagnosis not present

## 2019-07-11 DIAGNOSIS — Z951 Presence of aortocoronary bypass graft: Secondary | ICD-10-CM | POA: Diagnosis not present

## 2019-07-11 DIAGNOSIS — Z7982 Long term (current) use of aspirin: Secondary | ICD-10-CM | POA: Diagnosis not present

## 2019-07-11 DIAGNOSIS — E7800 Pure hypercholesterolemia, unspecified: Secondary | ICD-10-CM | POA: Diagnosis not present

## 2019-07-11 DIAGNOSIS — I252 Old myocardial infarction: Secondary | ICD-10-CM | POA: Diagnosis not present

## 2019-07-11 DIAGNOSIS — S51811A Laceration without foreign body of right forearm, initial encounter: Secondary | ICD-10-CM | POA: Diagnosis not present

## 2019-07-11 DIAGNOSIS — Z85828 Personal history of other malignant neoplasm of skin: Secondary | ICD-10-CM | POA: Diagnosis not present

## 2019-07-24 DIAGNOSIS — L03032 Cellulitis of left toe: Secondary | ICD-10-CM | POA: Diagnosis not present

## 2019-07-24 DIAGNOSIS — L97521 Non-pressure chronic ulcer of other part of left foot limited to breakdown of skin: Secondary | ICD-10-CM | POA: Diagnosis not present

## 2019-07-24 DIAGNOSIS — Z6827 Body mass index (BMI) 27.0-27.9, adult: Secondary | ICD-10-CM | POA: Diagnosis not present

## 2019-07-25 DIAGNOSIS — R600 Localized edema: Secondary | ICD-10-CM | POA: Diagnosis not present

## 2019-07-25 DIAGNOSIS — I10 Essential (primary) hypertension: Secondary | ICD-10-CM | POA: Diagnosis not present

## 2019-08-07 DIAGNOSIS — Z6827 Body mass index (BMI) 27.0-27.9, adult: Secondary | ICD-10-CM | POA: Diagnosis not present

## 2019-08-07 DIAGNOSIS — L97521 Non-pressure chronic ulcer of other part of left foot limited to breakdown of skin: Secondary | ICD-10-CM | POA: Diagnosis not present

## 2019-08-07 DIAGNOSIS — L03032 Cellulitis of left toe: Secondary | ICD-10-CM | POA: Diagnosis not present

## 2019-08-08 DIAGNOSIS — E785 Hyperlipidemia, unspecified: Secondary | ICD-10-CM | POA: Diagnosis not present

## 2019-08-08 DIAGNOSIS — E871 Hypo-osmolality and hyponatremia: Secondary | ICD-10-CM | POA: Diagnosis not present

## 2019-08-08 DIAGNOSIS — D638 Anemia in other chronic diseases classified elsewhere: Secondary | ICD-10-CM | POA: Diagnosis not present

## 2019-08-08 DIAGNOSIS — Z6827 Body mass index (BMI) 27.0-27.9, adult: Secondary | ICD-10-CM | POA: Diagnosis not present

## 2019-08-08 DIAGNOSIS — I10 Essential (primary) hypertension: Secondary | ICD-10-CM | POA: Diagnosis not present

## 2019-08-28 DIAGNOSIS — R2689 Other abnormalities of gait and mobility: Secondary | ICD-10-CM | POA: Diagnosis not present

## 2019-08-28 DIAGNOSIS — B353 Tinea pedis: Secondary | ICD-10-CM | POA: Diagnosis not present

## 2019-08-28 DIAGNOSIS — Z6827 Body mass index (BMI) 27.0-27.9, adult: Secondary | ICD-10-CM | POA: Diagnosis not present

## 2019-08-28 DIAGNOSIS — L97521 Non-pressure chronic ulcer of other part of left foot limited to breakdown of skin: Secondary | ICD-10-CM | POA: Diagnosis not present

## 2019-09-05 ENCOUNTER — Telehealth: Payer: Self-pay | Admitting: Family Medicine

## 2019-09-05 DIAGNOSIS — Z85828 Personal history of other malignant neoplasm of skin: Secondary | ICD-10-CM | POA: Diagnosis not present

## 2019-09-05 DIAGNOSIS — L570 Actinic keratosis: Secondary | ICD-10-CM | POA: Diagnosis not present

## 2019-09-05 DIAGNOSIS — L821 Other seborrheic keratosis: Secondary | ICD-10-CM | POA: Diagnosis not present

## 2019-09-05 DIAGNOSIS — R110 Nausea: Secondary | ICD-10-CM

## 2019-09-05 DIAGNOSIS — L820 Inflamed seborrheic keratosis: Secondary | ICD-10-CM | POA: Diagnosis not present

## 2019-09-05 DIAGNOSIS — D225 Melanocytic nevi of trunk: Secondary | ICD-10-CM | POA: Diagnosis not present

## 2019-09-05 MED ORDER — ONDANSETRON 8 MG PO TBDP: 8.0000 mg | ORAL_TABLET | Freq: Every day | ORAL | 1 refills | Status: DC | PRN

## 2019-09-05 NOTE — Telephone Encounter (Signed)
Pt is requesting refill on ondansetron 8 mg called into his mail oder

## 2019-09-12 DIAGNOSIS — H6123 Impacted cerumen, bilateral: Secondary | ICD-10-CM | POA: Diagnosis not present

## 2019-09-15 ENCOUNTER — Ambulatory Visit
Admission: EM | Admit: 2019-09-15 | Discharge: 2019-09-15 | Disposition: A | Discharge status: Home / Self Care | Payer: Medicare Other | Attending: Emergency Medicine | Admitting: Emergency Medicine

## 2019-09-15 ENCOUNTER — Other Ambulatory Visit: Payer: Self-pay

## 2019-09-15 ENCOUNTER — Ambulatory Visit: Payer: Medicare Other

## 2019-09-15 ENCOUNTER — Encounter: Payer: Self-pay | Admitting: Emergency Medicine

## 2019-09-15 DIAGNOSIS — Y929 Unspecified place or not applicable: Secondary | ICD-10-CM | POA: Diagnosis not present

## 2019-09-15 DIAGNOSIS — Z7982 Long term (current) use of aspirin: Secondary | ICD-10-CM | POA: Diagnosis not present

## 2019-09-15 DIAGNOSIS — T07XXXA Unspecified multiple injuries, initial encounter: Secondary | ICD-10-CM | POA: Diagnosis not present

## 2019-09-15 DIAGNOSIS — Z7901 Long term (current) use of anticoagulants: Secondary | ICD-10-CM | POA: Insufficient documentation

## 2019-09-15 DIAGNOSIS — Y939 Activity, unspecified: Secondary | ICD-10-CM | POA: Diagnosis not present

## 2019-09-15 DIAGNOSIS — I2510 Atherosclerotic heart disease of native coronary artery without angina pectoris: Secondary | ICD-10-CM | POA: Diagnosis not present

## 2019-09-15 DIAGNOSIS — I110 Hypertensive heart disease with heart failure: Secondary | ICD-10-CM | POA: Insufficient documentation

## 2019-09-15 DIAGNOSIS — S51001A Unspecified open wound of right elbow, initial encounter: Secondary | ICD-10-CM | POA: Diagnosis not present

## 2019-09-15 DIAGNOSIS — Z7902 Long term (current) use of antithrombotics/antiplatelets: Secondary | ICD-10-CM | POA: Diagnosis not present

## 2019-09-15 DIAGNOSIS — S299XXA Unspecified injury of thorax, initial encounter: Secondary | ICD-10-CM | POA: Diagnosis not present

## 2019-09-15 DIAGNOSIS — W010XXA Fall on same level from slipping, tripping and stumbling without subsequent striking against object, initial encounter: Secondary | ICD-10-CM | POA: Diagnosis not present

## 2019-09-15 DIAGNOSIS — R0602 Shortness of breath: Secondary | ICD-10-CM | POA: Insufficient documentation

## 2019-09-15 DIAGNOSIS — R0781 Pleurodynia: Secondary | ICD-10-CM | POA: Diagnosis not present

## 2019-09-15 DIAGNOSIS — W19XXXA Unspecified fall, initial encounter: Secondary | ICD-10-CM | POA: Diagnosis not present

## 2019-09-15 DIAGNOSIS — I252 Old myocardial infarction: Secondary | ICD-10-CM | POA: Diagnosis not present

## 2019-09-15 DIAGNOSIS — S20221A Contusion of right back wall of thorax, initial encounter: Secondary | ICD-10-CM | POA: Diagnosis not present

## 2019-09-15 DIAGNOSIS — M21371 Foot drop, right foot: Secondary | ICD-10-CM | POA: Diagnosis not present

## 2019-09-15 DIAGNOSIS — I509 Heart failure, unspecified: Secondary | ICD-10-CM | POA: Diagnosis not present

## 2019-09-15 DIAGNOSIS — M21372 Foot drop, left foot: Secondary | ICD-10-CM | POA: Insufficient documentation

## 2019-09-15 DIAGNOSIS — Z79899 Other long term (current) drug therapy: Secondary | ICD-10-CM | POA: Insufficient documentation

## 2019-09-15 MED ORDER — ACETAMINOPHEN 500 MG PO TABS
1000.0000 mg | ORAL_TABLET | Freq: Once | ORAL | Status: AC
  Administered 2019-09-15: 1000 mg via ORAL

## 2019-09-15 NOTE — Discharge Instructions (Addendum)
You may take at 1000 mg of Tylenol 3-4 times a day as needed for pain.  If you have any meloxicam/Mobic, you can try that as well.  You can try bacitracin on your cuts although I suspect that they will heal just fine with the way we have them wrapped.  I hope you feel better soon.

## 2019-09-15 NOTE — ED Notes (Signed)
Applied Xeroform gauze and wrapped guaze, then secured with coban to skin tares on right forearm.

## 2019-09-15 NOTE — ED Provider Notes (Signed)
HPI  SUBJECTIVE:  [name] is a 84 y.o. male who presents with right-sided lower rib pain, shortness of breath due to pain with inspiration, right elbow pain  present only with flexion extension, some skin avulsions over the right elbow, multiple ecchymosis after having a slip and fall while wearing some slippery socks last night.  Patient states that he was in the bathroom when his "feet went out from under me" and he fell landing onto the tile floor.  Landed on his right side.   He denies preceding chest pain shortness of breath palpitations syncope causing the fall.  He denies head injury loss of consciousness.  No abdominal, hip pain.  No cough, fevers, hematuria.  Chest pain is worse with deep inspiration.  No alleviating factors.   Past medical history of coronary artery disease, MI, prostate cancer, hypertension, peripheral neuropathy, CHF, bilateral foot drop.  Is on 81 mg aspirin daily.  No other anticoagulants/ antiplatelets.  No history of diabetes osteoporosis chronic kidney disease.  [id number], Devonne Doughty, DO   Past Medical History:  Diagnosis Date  . Arthritis   . Benign essential tremor   . Fracture, femur ([place]) 2012  . GERD (gastroesophageal reflux disease)   . H/O echocardiogram    05/2013  . Hx of colonoscopy    04/22/2013  . Hypercholesterolemia   . Myocardial infarction ([place])    1987  . Polyneuropathy   . Prostate cancer ([place]) 2004  . Superficial hematoma     Past Surgical History:  Procedure Laterality Date  . BASAL CELL CARCINOMA EXCISION    . COLONOSCOPY  05/01/2009   Positive for colonic polyps  . COLONOSCOPY WITH PROPOFOL N/A 02/24/2016   Procedure: COLONOSCOPY WITH PROPOFOL;  Surgeon: Christene Lye, MD;  Location: [facility] ENDOSCOPY;  Service: Endoscopy;  Laterality: N/A;  . CORONARY ARTERY BYPASS GRAFT  1987   X2  . HERNIA REPAIR  1990  . PROSTATE SURGERY  2004    Family History  Problem Relation Age of Onset  . Heart disease Mother       Social History   Tobacco Use  . Smoking status: Never Smoker  . Smokeless tobacco: Never Used  Substance Use Topics  . Alcohol use: Yes    Alcohol/week: 0.0 standard drinks    Comment: wine in the evening  . Drug use: No    No current facility-administered medications for this encounter.  Current Outpatient Medications:  .  Alpha Lipoic Acid 200 MG CAPS, Take 2 capsules by mouth daily., Disp: , Rfl:  .  amLODipine (NORVASC) 2.5 MG tablet, TAKE 1 TABLET BY MOUTH  DAILY, Disp: 90 tablet, Rfl: 3 .  Apoaequorin (PREVAGEN PO), Take by mouth., Disp: , Rfl:  .  aspirin 81 MG tablet, Take 81 mg by mouth daily. , Disp: , Rfl:  .  B Complex Vitamins (VITAMIN-B COMPLEX PO), Take 1 tablet by mouth daily. Reported on 10/06/2015, Disp: , Rfl:  .  Biotin 1000 MCG tablet, Take 1,000 mcg by mouth daily., Disp: , Rfl:  .  Calcium Carb-Cholecalciferol 500-100 MG-UNIT CHEW, Chew 500 mg by mouth daily., Disp: , Rfl:  .  cyanocobalamin 1000 MCG tablet, Take 1,000 mcg by mouth daily., Disp: , Rfl:  .  folic acid (FOLVITE) 1 MG tablet, Take 1 mg by mouth daily., Disp: , Rfl:  .  furosemide (LASIX) 20 MG tablet, TAKE 1 TABLET BY MOUTH  DAILY, Disp: 90 tablet, Rfl: 3 .  gabapentin (NEURONTIN) 600 MG tablet, TAKE  2 TABLETS BY MOUTH TWO TIMES DAILY, Disp: 360 tablet, Rfl: 3 .  Glucosamine Sulfate 1000 MG CAPS, Take 1 capsule by mouth daily., Disp: , Rfl:  .  hydrochlorothiazide (HYDRODIURIL) 25 MG tablet, TAKE 1 TABLET BY MOUTH  DAILY, Disp: 90 tablet, Rfl: 3 .  Lecithin 400 MG CAPS, Take 400 mg by mouth daily., Disp: , Rfl:  .  losartan (COZAAR) 50 MG tablet, TAKE 1 TABLET BY MOUTH  DAILY, Disp: 90 tablet, Rfl: 3 .  Melatonin 3 MG CAPS, Take 3 mg by mouth at bedtime., Disp: , Rfl:  .  meloxicam (MOBIC) 15 MG tablet, Take 1 tablet (15 mg total) by mouth daily as needed for pain. For up to 1-2 weeks, then reduce to less frequent for longer if need (Patient not taking: Reported on 02/05/2019), Disp: 30 tablet,  Rfl: 1 .  Omega 3 1000 MG CAPS, Take 1,000 mg by mouth daily., Disp: , Rfl:  .  omeprazole (PRILOSEC) 20 MG capsule, TAKE 1 CAPSULE BY MOUTH TWO TIMES DAILY BEFORE MEALS, Disp: 180 capsule, Rfl: 3 .  ondansetron (ZOFRAN-ODT) 8 MG disintegrating tablet, Take 1 tablet (8 mg total) by mouth daily as needed for nausea or vomiting., Disp: 90 tablet, Rfl: 1 .  Potassium Chloride ER 20 MEQ TBCR, Alternating dose one day 2 tablets, next day 1 tablet., Disp: 90 tablet, Rfl: 3 .  pravastatin (PRAVACHOL) 80 MG tablet, TAKE 1 TABLET BY MOUTH  DAILY, Disp: 90 tablet, Rfl: 3 .  primidone (MYSOLINE) 50 MG tablet, TAKE 1 TABLET BY MOUTH 3  TIMES DAILY, Disp: 270 tablet, Rfl: 3 .  Selenium 200 MCG CAPS, Take 200 mcg by mouth daily., Disp: , Rfl:  .  tretinoin (RETIN-A) 0.05 % cream, Apply 1 application topically at bedtime., Disp: , Rfl:   Allergies  Allergen Reactions  . Codeine Nausea And Vomiting  . Indocin [Indomethacin] Other (See Comments)  . Latex Other (See Comments)    Blisters  . Mupirocin     Blisters  . Plavix [Clopidogrel Bisulfate]     GI intolerance, n/v  . Polysporin [Bacitracin-Polymyxin B]     Blisters  . Shellfish Allergy   . Tape   . Neosporin [Neomycin-Bacitracin Zn-Polymyx] Rash  . Other Nausea And Vomiting    mussels     ROS  As noted in HPI.   Physical Exam  BP 124/76 (BP Location: Left Arm)   Pulse 84   Temp 98.1 F (36.7 C) (Oral)   Resp 16   Ht 6' (1.829 m)   Wt 93 kg   SpO2 98%   BMI 27.80 kg/m   Constitutional: Well developed, well nourished, appears uncomfortable.   Eyes: PERRL, EOMI, conjunctiva normal bilaterally HENT: Normocephalic, atraumatic,mucus membranes moist Respiratory: Increased respiratory effort.  Slightly dyspneic while talking.  Limited inspiratory effort secondary to pain.  Clear to auscultation bilaterally, no rales, no wheezing, no rhonchi.  Positive tenderness lower posterior ribs going to the mid axillary line.  No appreciable  crepitus.  No bruising in this area.  Positive nontender bruise mid right upper back.  No bruising right anterior chest or over the rest of the chest. Cardiovascular: Normal rate and rhythm, no murmurs, no gallops, no rubs GI: Soft, nondistended, normal bowel sounds, nontender, no rebound, no guarding.  Positive nontender bruise over right lower flank.  Positive subxiphoid bruise.  Patient states that this is old from a previous fall Back: No T-spine L-spine tenderness skin: Multiple ecchymosis upper extremities bilaterally.  Positive  skin avulsions over right elbow.  Musculoskeletal: Right RP 2+.  Patient able to flex/extend elbow without any problem.  No supracondylar tenderness.  No bony tenderness over the entire elbow.  No tenderness over the upper arm,  forearm, wrist.  Sensation distally grossly intact.       Neurologic: Alert & oriented x 3, CN III-XII grossly intact, no motor deficits, sensation grossly intact Psychiatric: Speech and behavior appropriate   ED Course   Medications  acetaminophen (TYLENOL) tablet 1,000 mg (1,000 mg Oral Given 09/15/19 1126)    Orders Placed This Encounter  Procedures  . DG Ribs Unilateral W/Chest Right    Standing Status:   Standing    Number of Occurrences:   1    Order Specific Question:   Reason for Exam (SYMPTOM  OR DIAGNOSIS REQUIRED)    Answer:   fall - suspect lower posterior rib FX. r/o PTX effusion rib fx  . Apply dressing    With Xeroform underneath it    Standing Status:   Standing    Number of Occurrences:   1  . Wound care    Standing Status:   Standing    Number of Occurrences:   1   No results found for this or any previous visit (from the past 24 hour(s)). DG Ribs Unilateral W/Chest Right  Result Date: 09/15/2019 CLINICAL DATA:  Fall, suspect posterior rib fracture EXAM: RIGHT RIBS AND CHEST - 3+ VIEW COMPARISON:  02/01/2019 FINDINGS: No fracture or other bone lesions are seen involving the ribs. There is no evidence of  pneumothorax or pleural effusion. Both lungs are clear. Cardiomegaly status post median sternotomy. IMPRESSION: No displaced rib fractures.  No pneumothorax or pleural effusion. Electronically Signed   By: Eddie Candle M.D.   On: 09/15/2019 11:43    ED Clinical Impression  No diagnosis found.   ED Assessment/Plan  [facility] narcotic database reviewed.  No opiate prescriptions in the past 2 years.  Labs reviewed.  Normal kidney function as of October 2020.  Will give 1000 g of Tylenol.  Patient reports allergy to indomethacin.  States it makes him feel like "I am about to die" withholding NSAIDs  Patient status post mechanical fall last night.  Doubt cardiac event causing this fall.  He denies head trauma  loss of consciousness.  Do not feel he needs a head CT.  Doubt intra-abdominal, hip injury.  While he has multiple ecchymosis over his upper extremities and some skin avulsions over his elbow, his elbow is nontender.  He has full range of motion of his elbow.  He is neurovascularly intact.  Patient does not think that he needs imaging of his elbow, and I agree.  Deferring imaging of the elbow.  Obtaining rib series to rule out rib fracture pneumothorax pleural effusion.  There is nothing to sew on his elbow.  We will have this dressed with Xeroform as he has allergy to Neosporin and Bactroban listed.   Reviewed imaging independently.  No pneumothorax, pleural effusion, no displaced rib fractures. See radiology report for full details.  Patient with multiple contusions from his mechanical fall.  Home with Tylenol.  We discussed Norco but patient declined this.  He has Mobic on his medication list.  He may try some of this as well.  Bacitracin for the cuts.  Follow-up with PMD in several days.  Gave him strict ER return precautions.  Discussed imaging, MDM, treatment plan, and plan for follow-up with patient Discussed sn/sx that  should prompt return to the ED. patient agrees with plan.    Meds ordered this encounter  Medications  . acetaminophen (TYLENOL) tablet 1,000 mg    *This clinic note was created using [employer]. Therefore, there may be occasional mistakes despite careful proofreading.  ?    Melynda Ripple, MD 09/15/19 1217

## 2019-09-15 NOTE — ED Triage Notes (Addendum)
Patient states that last night he slipped on the tile floor in his bathroom with his socks only on.  Patient states that he landed on the floor on his right side.  Patient c/o right sided rib pain and right elbow pain.  Patient has bruising and bleeding from his right elbow.

## 2019-09-18 DIAGNOSIS — Z89412 Acquired absence of left great toe: Secondary | ICD-10-CM | POA: Diagnosis not present

## 2019-09-18 DIAGNOSIS — I739 Peripheral vascular disease, unspecified: Secondary | ICD-10-CM | POA: Diagnosis not present

## 2019-09-20 ENCOUNTER — Other Ambulatory Visit: Payer: Self-pay

## 2019-09-20 ENCOUNTER — Ambulatory Visit (INDEPENDENT_AMBULATORY_CARE_PROVIDER_SITE_OTHER): Payer: Medicare Other | Admitting: Family Medicine

## 2019-09-20 ENCOUNTER — Encounter: Payer: Self-pay | Admitting: Family Medicine

## 2019-09-20 VITALS — BP 101/54 | HR 93 | Temp 97.8°F | Ht 72.0 in | Wt 204.2 lb

## 2019-09-20 DIAGNOSIS — I10 Essential (primary) hypertension: Secondary | ICD-10-CM | POA: Diagnosis not present

## 2019-09-20 DIAGNOSIS — S80211A Abrasion, right knee, initial encounter: Secondary | ICD-10-CM | POA: Diagnosis not present

## 2019-09-20 DIAGNOSIS — Z89412 Acquired absence of left great toe: Secondary | ICD-10-CM | POA: Diagnosis not present

## 2019-09-20 MED ORDER — HYDROCHLOROTHIAZIDE 25 MG PO TABS: 25.0000 mg | ORAL_TABLET | Freq: Two times a day (BID) | ORAL | 3 refills | Status: DC

## 2019-09-20 NOTE — Assessment & Plan Note (Addendum)
Stable At inc risk of fall S/p L great toe MTP amputation 2018

## 2019-09-20 NOTE — Patient Instructions (Signed)
Thank you for coming to the office today.  Check records from previous blood work in [place], to see if what your dosing of HCTZ was at that time, if you had only recently switched, then we would need to repeat it, because I would like to see how your chemistry is doing now longer on this new dosage.  I have orders in the system now for [place], and all other labs - including PSA< A1c, Lipids, Thyroid TSH.  Let me know what blood work you want [place] to run in April 2021 before you leave for [place].  Call or message or contact me and we can order ONLY Chemistry if you want, OR we can do a full panel.  I have ordered HCTZ 25mg  twice a day for 180 pills to [facility] - likely try this for 6-12 months.  -------- Right Knee - is healing appropriately. I recommend continue with soap and water, and use vaseline to keep it protected, this can slow down scabbing process but it should stay clean. If it changes with increased redness and drainage of pus or pain, then we can add an oral antibiotic if you need.   Please schedule a Follow-up Appointment to: No follow-ups on file.  If you have any other questions or concerns, please feel free to call the office or send a message through [place]. You may also schedule an earlier appointment if necessary.  Additionally, you may be receiving a survey about your experience at our office within a few days to 1 week by e-mail or mail. We value your feedback.  [name], DO [place]

## 2019-09-20 NOTE — Progress Notes (Signed)
Subjective:    Patient ID: [name], male    DOB: 04-15-1936, 84 y.o.   MRN: [id number]  [name] is a 84 y.o. male presenting on 09/20/2019 for Fall (ER follow up from Saturday)   HPI    Urgent Care FOLLOW-UP VISIT  Hospital/Location: [facility] Date of UC Visit: 09/15/19  Reason for Presenting to UC: Fall, injury Primary (+Secondary) Diagnosis: Fall with multiple contusions, abrasions, R knee abrasion  FOLLOW-UP - UC provider note and record have been reviewed - Patient presents today about 5 days after recent UC visit. Brief summary of recent course, patient had symptoms of acute fall slipped while wearing socks, that are slippery or slick he says, fall backwards and did not hit his head but hit his R flank ribs and R upper extremity and R knee, testing in Urgent Care with Chest X-ray Rib x-ray negative  - Today reports overall has done well after discharge from ED. Symptoms of abrasions have improved some with some healing. Still dealing with abrasion R Knee. No further falls or problems.  HTN / LE Edema - Here in [place] until mid May 2021 - He was seen at Podiatry in [facility], thought was having inc swelling edema. Returned to PCP down in [place] and they increased HCTZ 25mg  to BID instead of daily. It seemed to help with improved swelling. - He continues to take HCTZ 25 BID, and requests new order on this  PMH L Great Toe Amputation   Depression screen [facility] 2/9 03/20/2019 02/05/2019 09/05/2018  Decreased Interest 0 0 0  Down, Depressed, Hopeless 0 0 0  PHQ - 2 Score 0 0 0    Social History   Tobacco Use  . Smoking status: Never Smoker  . Smokeless tobacco: Never Used  Substance Use Topics  . Alcohol use: Yes    Alcohol/week: 0.0 standard drinks    Comment: wine in the evening  . Drug use: No    Review of Systems Per HPI unless specifically indicated above     Objective:    BP (!) 101/54   Pulse 93   Temp 97.8 F (36.6 C) (Temporal)   Ht 6' (1.829 m)   Wt 204 lb 3.2 oz (92.6  kg)   SpO2 98%   BMI 27.69 kg/m   Wt Readings from Last 3 Encounters:  09/20/19 204 lb 3.2 oz (92.6 kg)  09/15/19 205 lb (93 kg)  03/20/19 202 lb (91.6 kg)    Physical Exam Vitals and nursing note reviewed.  Constitutional:      General: He is not in acute distress.    Appearance: He is well-developed. He is not diaphoretic.     Comments: Well-appearing, comfortable, cooperative  HENT:     Head: Normocephalic and atraumatic.  Eyes:     General:        Right eye: No discharge.        Left eye: No discharge.     Conjunctiva/sclera: Conjunctivae normal.  Cardiovascular:     Rate and Rhythm: Normal rate.  Pulmonary:     Effort: Pulmonary effort is normal.  Musculoskeletal:     Comments: AFO Bilateral Lower Leg Braces in place, using Cane for ambulation assistance   S/p L great toe amputation  Skin:    General: Skin is warm and dry.     Findings: Bruising (R forearm. R knee) present. No erythema or rash.     Comments: R Knee anteriorly - Superficial abrasion with healing scab forming no  oozing or bleeding, localized erythema only no induration or spreading erythema  R Forearm with abrasion and with scab formation  Neurological:     Mental Status: He is alert and oriented to person, place, and time.  Psychiatric:        Behavior: Behavior normal.     Comments: Well groomed, good eye contact, normal speech and thoughts      I have personally reviewed the radiology report from 09/15/19 on CXR / Ribs  CLINICAL DATA:  Fall, suspect posterior rib fracture  EXAM: RIGHT RIBS AND CHEST - 3+ VIEW  COMPARISON:  02/01/2019  FINDINGS: No fracture or other bone lesions are seen involving the ribs. There is no evidence of pneumothorax or pleural effusion. Both lungs are clear. Cardiomegaly status post median sternotomy.  IMPRESSION: No displaced rib fractures.  No pneumothorax or pleural effusion.   Electronically Signed   By: Eddie Candle M.D.   On: 09/15/2019  11:43  Results for orders placed or performed in visit on 03/15/19  Hemoglobin A1c  Result Value Ref Range   Hgb A1c MFr Bld 4.7 <5.7 % of total Hgb   Mean Plasma Glucose 88 (calc)   eAG (mmol/L) 4.9 (calc)  BASIC METABOLIC PANEL WITH GFR  Result Value Ref Range   Glucose, Bld 98 65 - 99 mg/dL   BUN 12 7 - 25 mg/dL   Creat 0.80 0.70 - 1.11 mg/dL   GFR, Est Non African American 84 > OR = 60 mL/min/1.35m2   GFR, Est African American 97 > OR = 60 mL/min/1.73m2   BUN/Creatinine Ratio NOT APPLICABLE 6 - 22 (calc)   Sodium 133 (L) 135 - 146 mmol/L   Potassium 3.8 3.5 - 5.3 mmol/L   Chloride 93 (L) 98 - 110 mmol/L   CO2 28 20 - 32 mmol/L   Calcium 9.6 8.6 - 10.3 mg/dL      Assessment & Plan:   Problem List Items Addressed This Visit    Essential hypertension    Low normal BP - recent change on doubled HCTZ by other PCP in [place] BP readings normal  No known complications Followed by Cardiology [facility]  Plan:  1. Continue current BP regimen - Losartan, HCTZ 25 BID, Amlodipine, Lasix, K supplement 2. Encourage improved lifestyle - low sodium diet, regular exercise 3. Continue monitor BP outside office, bring readings to next visit, if persistently >140/90 or new symptoms notify office sooner  Last lab done in [place], requesting record. May not have been on double HCTZ for long, I advised we would need a chemistry in next few weeks.      Relevant Medications   hydrochlorothiazide (HYDRODIURIL) 25 MG tablet   Amputee, great toe, left (HCC)    Stable At inc risk of fall S/p L great toe MTP amputation 2018       Other Visit Diagnoses    Abrasion of right knee, initial encounter    -  Primary      #Fall / Abrasion R Knee / Forearm Reassurance. Seems to be healing No obvious concerns. Reviewed X-ray/Rib image from 09/15/19 at [facility] - no acute injury At risk of falls with chronic  Routine wound care. He is allergic to neosporin and mupirocin. Therefore, he can use soap /  water cleansing and vaseline to protect, leave open to air dry   Meds ordered this encounter  Medications  . hydrochlorothiazide (HYDRODIURIL) 25 MG tablet    Sig: Take 1 tablet (25 mg total) by  mouth 2 (two) times daily.    Dispense:  180 tablet    Refill:  3    Requesting 1 year supply      Follow up plan: Return if symptoms worsen or fail to improve.  Defer future lab orders, until hear back from patient/wife [name]. We had extensive lab panel already ordered for approximately 09/2019 as a routine q 6 month lab, but he may defer this blood work.  I advised that we would need to check at least Chemistry panel due to HCTZ dosing for his electrolytes within next few weeks - they will contact us with request on labs.  [name], [facility] 09/20/2019, 1:41 PM

## 2019-09-20 NOTE — Assessment & Plan Note (Addendum)
Low normal BP - recent change on doubled HCTZ by other PCP in [place] due to swelling. - Home BP readings normal  No known complications Followed by Cardiology [facility]  Plan:  1. Continue current BP regimen - Losartan, HCTZ 25 BID (due to swelling), Amlodipine, Lasix, K supplement 2. Encourage improved lifestyle - low sodium diet, regular exercise 3. Continue monitor BP outside office, bring readings to next visit, if persistently >140/90 or new symptoms notify office sooner  Last lab done in [place], requesting record. May not have been on double HCTZ for long, I advised we would need a chemistry in next few weeks.

## 2019-09-22 ENCOUNTER — Other Ambulatory Visit: Payer: Self-pay

## 2019-09-22 ENCOUNTER — Ambulatory Visit
Admission: EM | Admit: 2019-09-22 | Discharge: 2019-09-22 | Disposition: A | Discharge status: Home / Self Care | Payer: Medicare Other | Attending: Emergency Medicine | Admitting: Emergency Medicine

## 2019-09-22 DIAGNOSIS — R220 Localized swelling, mass and lump, head: Secondary | ICD-10-CM | POA: Diagnosis not present

## 2019-09-22 MED ORDER — AMOXICILLIN-POT CLAVULANATE 875-125 MG PO TABS: 1.0000 | ORAL_TABLET | Freq: Two times a day (BID) | ORAL | 0 refills | Status: DC

## 2019-09-22 NOTE — Discharge Instructions (Addendum)
I wonder if this could be an early dental infection.  You have some decay at the base of your gums.  I am going to send you home with amoxicillin in case it is a dental infection.  You can do salt water or Listerine rinses to help keep the mouth clean.  Try some cool compresses and see if that does not help with the swelling.  Go immediately to the ER if your lips, tongue start to swell, if you have difficulty breathing, shortness of breath or for any other concerns.

## 2019-09-22 NOTE — ED Triage Notes (Signed)
Pt presents with c/o left-sided facial swelling. Pt denies any pain. Pt denies any known causes, new medications, new foods, new drinks, new mouthwash/toothpaste or other items. Pt also denies and dental pain.

## 2019-09-22 NOTE — ED Provider Notes (Signed)
HPI  SUBJECTIVE:  [name] is a 84 y.o. male who presents with acute onset of atraumatic left-sided anterior facial and upper lip swelling starting this morning.  No itching, facial pain, lip pain.  No dental pain, trauma.  States that he took 2 aspirin around 1230 and says that the swelling is getting better.  No aggravating factors.  No new lotions soaps detergents meds foods.  No lip or tongue swelling, difficulty breathing fevers drooling trismus.  No facial tenderness.  No periorbital swelling.  No shortness of breath, wheezing.  No known insect bite.  He has never had symptoms like this before.  Past medical history negative for eczema asthma anaphylaxis.  He has a history of hypertension but states that he is taking only Norvasc for this.  Denies being on an ACE inhibitor or ARB.  No history of diabetes.  He has a past medical history of allergies, MI, prostate cancer.  [id number], Devonne Doughty, DO   Past Medical History:  Diagnosis Date  . Arthritis   . Benign essential tremor   . Fracture, femur ([place]) 2012  . GERD (gastroesophageal reflux disease)   . H/O echocardiogram    05/2013  . Hx of colonoscopy    04/22/2013  . Hypercholesterolemia   . Myocardial infarction ([place])    1987  . Polyneuropathy   . Prostate cancer ([place]) 2004  . Superficial hematoma     Past Surgical History:  Procedure Laterality Date  . BASAL CELL CARCINOMA EXCISION    . COLONOSCOPY  05/01/2009   Positive for colonic polyps  . COLONOSCOPY WITH PROPOFOL N/A 02/24/2016   Procedure: COLONOSCOPY WITH PROPOFOL;  Surgeon: Christene Lye, MD;  Location: [facility] ENDOSCOPY;  Service: Endoscopy;  Laterality: N/A;  . CORONARY ARTERY BYPASS GRAFT  1987   X2  . HERNIA REPAIR  1990  . PROSTATE SURGERY  2004    Family History  Problem Relation Age of Onset  . Heart disease Mother     Social History   Tobacco Use  . Smoking status: Never Smoker  . Smokeless tobacco: Never Used  Substance Use Topics    . Alcohol use: Yes    Alcohol/week: 0.0 standard drinks    Comment: wine in the evening  . Drug use: No    No current facility-administered medications for this encounter.  Current Outpatient Medications:  .  Alpha Lipoic Acid 200 MG CAPS, Take 2 capsules by mouth daily., Disp: , Rfl:  .  amLODipine (NORVASC) 2.5 MG tablet, TAKE 1 TABLET BY MOUTH  DAILY, Disp: 90 tablet, Rfl: 3 .  Apoaequorin (PREVAGEN PO), Take by mouth., Disp: , Rfl:  .  aspirin 81 MG tablet, Take 81 mg by mouth daily. , Disp: , Rfl:  .  B Complex Vitamins (VITAMIN-B COMPLEX PO), Take 1 tablet by mouth daily. Reported on 10/06/2015, Disp: , Rfl:  .  Biotin 1000 MCG tablet, Take 1,000 mcg by mouth daily., Disp: , Rfl:  .  Calcium Carb-Cholecalciferol 500-100 MG-UNIT CHEW, Chew 500 mg by mouth daily., Disp: , Rfl:  .  cyanocobalamin 1000 MCG tablet, Take 1,000 mcg by mouth daily., Disp: , Rfl:  .  folic acid (FOLVITE) 1 MG tablet, Take 1 mg by mouth daily., Disp: , Rfl:  .  furosemide (LASIX) 20 MG tablet, TAKE 1 TABLET BY MOUTH  DAILY, Disp: 90 tablet, Rfl: 3 .  gabapentin (NEURONTIN) 600 MG tablet, TAKE 2 TABLETS BY MOUTH TWO TIMES DAILY, Disp: 360 tablet, Rfl: 3 .  Glucosamine Sulfate 1000 MG CAPS, Take 1 capsule by mouth daily., Disp: , Rfl:  .  hydrochlorothiazide (HYDRODIURIL) 25 MG tablet, Take 1 tablet (25 mg total) by mouth 2 (two) times daily., Disp: 180 tablet, Rfl: 3 .  Lecithin 400 MG CAPS, Take 400 mg by mouth daily., Disp: , Rfl:  .  losartan (COZAAR) 50 MG tablet, TAKE 1 TABLET BY MOUTH  DAILY, Disp: 90 tablet, Rfl: 3 .  Melatonin 3 MG CAPS, Take 3 mg by mouth at bedtime., Disp: , Rfl:  .  meloxicam (MOBIC) 15 MG tablet, Take 1 tablet (15 mg total) by mouth daily as needed for pain. For up to 1-2 weeks, then reduce to less frequent for longer if need, Disp: 30 tablet, Rfl: 1 .  Omega 3 1000 MG CAPS, Take 1,000 mg by mouth daily., Disp: , Rfl:  .  omeprazole (PRILOSEC) 20 MG capsule, TAKE 1 CAPSULE BY MOUTH  TWO TIMES DAILY BEFORE MEALS, Disp: 180 capsule, Rfl: 3 .  ondansetron (ZOFRAN-ODT) 8 MG disintegrating tablet, Take 1 tablet (8 mg total) by mouth daily as needed for nausea or vomiting., Disp: 90 tablet, Rfl: 1 .  Potassium Chloride ER 20 MEQ TBCR, Alternating dose one day 2 tablets, next day 1 tablet., Disp: 90 tablet, Rfl: 3 .  pravastatin (PRAVACHOL) 80 MG tablet, TAKE 1 TABLET BY MOUTH  DAILY, Disp: 90 tablet, Rfl: 3 .  primidone (MYSOLINE) 50 MG tablet, TAKE 1 TABLET BY MOUTH 3  TIMES DAILY, Disp: 270 tablet, Rfl: 3 .  Selenium 200 MCG CAPS, Take 200 mcg by mouth daily., Disp: , Rfl:  .  tretinoin (RETIN-A) 0.05 % cream, Apply 1 application topically at bedtime., Disp: , Rfl:  .  amoxicillin-clavulanate (AUGMENTIN) 875-125 MG tablet, Take 1 tablet by mouth 2 (two) times daily for 7 days., Disp: 14 tablet, Rfl: 0  Allergies  Allergen Reactions  . Codeine Nausea And Vomiting  . Indocin [Indomethacin] Other (See Comments)  . Latex Other (See Comments)    Blisters  . Mupirocin     Blisters  . Plavix [Clopidogrel Bisulfate]     GI intolerance, n/v  . Polysporin [Bacitracin-Polymyxin B]     Blisters  . Shellfish Allergy   . Tape   . Neosporin [Neomycin-Bacitracin Zn-Polymyx] Rash  . Other Nausea And Vomiting    mussels     ROS  As noted in HPI.   Physical Exam  BP 109/73 (BP Location: Left Arm)   Pulse 77   Temp 98.5 F (36.9 C) (Oral)   Resp 18   Ht 6' (1.829 m)   Wt 93 kg   SpO2 98%   BMI 27.80 kg/m   Constitutional: Well developed, well nourished, no acute distress Eyes:  EOMI, conjunctiva normal bilaterally HENT: Normocephalic, atraumatic,mucus membranes moist  Soft nontender area of swelling upper lip and anterior left cheek.  No gingival tenderness, swelling, no dental tenderness.  No evidence of dental trauma.  Positive area of erosion/decay at the base of left lower molar however no gingival swelling, tenderness, expressible purulent drainage.  No  fluctuance, overlying erythema Uvula midline, normal size.  Airway widely patent No swelling under tongue.  No expression of purulent drainage from [facility] or Stensen's duct.  No parotid gland tenderness, swelling  Normal side   Affected side     Respiratory: Normal inspiratory effort lungs clear bilaterally Cardiovascular: Normal rate GI: nondistended skin: No rash, skin intact Musculoskeletal: no deformities Neurologic: Alert & oriented x 3, no focal neuro  deficits Psychiatric: Speech and behavior appropriate   ED Course   Medications - No data to display  No orders of the defined types were placed in this encounter.   No results found for this or any previous visit (from the past 24 hour(s)). No results found.  ED Clinical Impression  1. Facial swelling   2. Swelling of upper lip      ED Assessment/Plan  BUN creatinine October 2020 were normal.  Patient denies history of kidney disease.   He denies any contact with lotions soaps detergents.  No new foods.  His airway is widely patent his lungs are clear.  Does not appear to be a superficial skin infection, parotitis.  Patient does not recall any trauma or insect bite.  There is no erythema.  It does not itch or burn.  Salivary glands are nontender and do not appear to be swollen.  Wonder if the swelling could be coming from the decay at the edge of the gum.  Patient states that he is not on ACE or ARB but medication list review shows that he is on losartan.  This could be the cause of his symptoms as well.  We will have him do cool compresses, discontinue losartan and see how he does.  If he is better in 24 hours then he does not need to start the antibiotics.  If he is not any better than he will contact his dentist tomorrow and start the antibiotics.  He will need to talk to his doctor about discontinuing the losartan.  We will send home with cool compresses, Augmentin in case this is an early dental infection.   Follow-up with PMD in several days if not getting any better.  To the ER if he gets worse.  Discussed  MDM, treatment plan, and plan for follow-up with patient. Discussed sn/sx that should prompt return to the ED. patient agrees with plan.   Meds ordered this encounter  Medications  . amoxicillin-clavulanate (AUGMENTIN) 875-125 MG tablet    Sig: Take 1 tablet by mouth 2 (two) times daily for 7 days.    Dispense:  14 tablet    Refill:  0    *This clinic note was created using [employer]. Therefore, there may be occasional mistakes despite careful proofreading.   ?    Melynda Ripple, MD 09/22/19 1601

## 2019-09-29 ENCOUNTER — Encounter: Payer: Self-pay | Admitting: Emergency Medicine

## 2019-09-29 ENCOUNTER — Ambulatory Visit
Admission: EM | Admit: 2019-09-29 | Discharge: 2019-09-29 | Disposition: A | Discharge status: Home / Self Care | Payer: Medicare Other | Attending: Family Medicine | Admitting: Family Medicine

## 2019-09-29 ENCOUNTER — Ambulatory Visit: Payer: Medicare Other

## 2019-09-29 ENCOUNTER — Other Ambulatory Visit: Payer: Self-pay

## 2019-09-29 DIAGNOSIS — S8991XA Unspecified injury of right lower leg, initial encounter: Secondary | ICD-10-CM | POA: Diagnosis not present

## 2019-09-29 DIAGNOSIS — M25561 Pain in right knee: Secondary | ICD-10-CM | POA: Diagnosis not present

## 2019-09-29 DIAGNOSIS — S80211A Abrasion, right knee, initial encounter: Secondary | ICD-10-CM | POA: Insufficient documentation

## 2019-09-29 NOTE — ED Triage Notes (Signed)
Patient states that he fell late Friday.  Patient states that he landed on his right knee.  Patient has ongoing pain in his right knee and has an abrasion that his right knee.

## 2019-09-29 NOTE — ED Provider Notes (Signed)
[facility]    CSN: [id number] Arrival date & time: 09/29/19  1100  History   Chief Complaint Chief Complaint  Patient presents with  . Knee Pain  . Fall   HPI  84 year old male presents with above complaint.  Patient states that he suffered a fall on Friday.  He states that he got his feet tangled up and subsequently fell landing on his right knee.  He suffered to abrasions on the anterior right knee.  He reports ongoing pain.  He is able to ambulate with a walker.  He rates his pain as 7/10 in severity.  No relieving factors.  No other reported injuries.  No other complaints at this time.  Past Medical History:  Diagnosis Date  . Arthritis   . Benign essential tremor   . Fracture, femur ([place]) 2012  . GERD (gastroesophageal reflux disease)   . H/O echocardiogram    05/2013  . Hx of colonoscopy    04/22/2013  . Hypercholesterolemia   . Myocardial infarction ([place])    1987  . Polyneuropathy   . Prostate cancer ([place]) 2004  . Superficial hematoma     Patient Active Problem List   Diagnosis Date Noted  . Osteoarthritis of multiple joints 12/15/2017  . Chronic hyponatremia 03/28/2017  . Abnormality of gait and mobility 01/11/2017  . Cloudy vision 01/11/2017  . Foot drop, bilateral 01/11/2017  . Amputee, great toe, left ([place]) 01/11/2017  . 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 Macrocytic anemia 09/22/2015  . Moderate tricuspid insufficiency 04/21/2015  . History of prostate cancer 04/16/2015  . Idiopathic peripheral neuropathy 04/16/2015  . Essential hypertension 04/16/2015  . GERD (gastroesophageal reflux disease) 04/16/2015  . Arthritis 04/16/2015  . Pedal edema 04/16/2015  . Benign essential tremor 10/30/2014  . Hyperlipidemia, mixed 10/27/2014  . Chronic systolic CHF (congestive heart failure), NYHA class 2 ([place]) 04/23/2014  . Mitral insufficiency 11/05/2013  . CAD (coronary artery disease) 11/01/2013  . Basal cell carcinoma of  skin of other parts of face 03/14/2013  . Ankle sprain 09/09/2011  . History of fibula fracture 09/09/2011    Past Surgical History:  Procedure Laterality Date  . BASAL CELL CARCINOMA EXCISION    . COLONOSCOPY  05/01/2009   Positive for colonic polyps  . COLONOSCOPY WITH PROPOFOL N/A 02/24/2016   Procedure: COLONOSCOPY WITH PROPOFOL;  Surgeon: Christene Lye, MD;  Location: [facility] ENDOSCOPY;  Service: Endoscopy;  Laterality: N/A;  . CORONARY ARTERY BYPASS GRAFT  1987   X2  . HERNIA REPAIR  1990  . PROSTATE SURGERY  2004       Home Medications    Prior to Admission medications   Medication Sig Start Date End Date Taking? Authorizing Provider  Alpha Lipoic Acid 200 MG CAPS Take 2 capsules by mouth daily.   Yes [provider]  amLODipine (NORVASC) 2.5 MG tablet TAKE 1 TABLET BY MOUTH  DAILY 04/22/19  Yes Karamalegos, Devonne Doughty, DO  amoxicillin-clavulanate (AUGMENTIN) 875-125 MG tablet Take 1 tablet by mouth 2 (two) times daily. X 7 days 09/22/19  Yes Melynda Ripple, MD  Apoaequorin (PREVAGEN PO) Take by mouth.   Yes [provider]  aspirin 81 MG tablet Take 81 mg by mouth daily.    Yes [provider]  B Complex Vitamins (VITAMIN-B COMPLEX PO) Take 1 tablet by mouth daily. Reported on 10/06/2015   Yes [provider]  Biotin 1000 MCG tablet Take 1,000 mcg by mouth daily.  Yes [provider]  Calcium Carb-Cholecalciferol 500-100 MG-UNIT CHEW Chew 500 mg by mouth daily.   Yes [provider]  cyanocobalamin 1000 MCG tablet Take 1,000 mcg by mouth daily.   Yes [provider]  folic acid (FOLVITE) 1 MG tablet Take 1 mg by mouth daily.   Yes [provider]  furosemide (LASIX) 20 MG tablet TAKE 1 TABLET BY MOUTH  DAILY 04/22/19  Yes Karamalegos, Devonne Doughty, DO  gabapentin (NEURONTIN) 600 MG tablet TAKE 2 TABLETS BY MOUTH TWO TIMES DAILY 04/22/19  Yes Karamalegos, Devonne Doughty, DO  Glucosamine Sulfate 1000 MG  CAPS Take 1 capsule by mouth daily.   Yes [provider]  hydrochlorothiazide (HYDRODIURIL) 25 MG tablet Take 1 tablet (25 mg total) by mouth 2 (two) times daily. 09/20/19  Yes Karamalegos, Devonne Doughty, DO  Lecithin 400 MG CAPS Take 400 mg by mouth daily.   Yes [provider]  Melatonin 3 MG CAPS Take 3 mg by mouth at bedtime.   Yes [provider]  meloxicam (MOBIC) 15 MG tablet Take 1 tablet (15 mg total) by mouth daily as needed for pain. For up to 1-2 weeks, then reduce to less frequent for longer if need 03/06/18  Yes Karamalegos, Alexander J, DO  Omega 3 1000 MG CAPS Take 1,000 mg by mouth daily.   Yes [provider]  omeprazole (PRILOSEC) 20 MG capsule TAKE 1 CAPSULE BY MOUTH TWO TIMES DAILY BEFORE MEALS 04/22/19  Yes Karamalegos, Devonne Doughty, DO  ondansetron (ZOFRAN-ODT) 8 MG disintegrating tablet Take 1 tablet (8 mg total) by mouth daily as needed for nausea or vomiting. 09/05/19  Yes Karamalegos, Devonne Doughty, DO  Potassium Chloride ER 20 MEQ TBCR Alternating dose one day 2 tablets, next day 1 tablet. 02/05/19  Yes Karamalegos, Devonne Doughty, DO  pravastatin (PRAVACHOL) 80 MG tablet TAKE 1 TABLET BY MOUTH  DAILY 04/22/19  Yes Karamalegos, Devonne Doughty, DO  primidone (MYSOLINE) 50 MG tablet TAKE 1 TABLET BY MOUTH 3  TIMES DAILY 04/22/19  Yes Karamalegos, Devonne Doughty, DO  Selenium 200 MCG CAPS Take 200 mcg by mouth daily.   Yes [provider]  tretinoin (RETIN-A) 0.05 % cream Apply 1 application topically at bedtime.   Yes [provider]  losartan (COZAAR) 50 MG tablet TAKE 1 TABLET BY MOUTH  DAILY 04/22/19 09/22/19  [name], DO    Family History Family History  Problem Relation Age of Onset  . Heart disease Mother     Social History Social History   Tobacco Use  . Smoking status: Never Smoker  . Smokeless tobacco: Never Used  Substance Use Topics  . Alcohol use: Yes    Alcohol/week: 0.0 standard drinks    Comment:  wine in the evening  . Drug use: No     Allergies   Codeine, Indocin [indomethacin], Latex, Mupirocin, Plavix [clopidogrel bisulfate], Polysporin [bacitracin-polymyxin b], Shellfish allergy, Tape, Neosporin [neomycin-bacitracin zn-polymyx], and Other   Review of Systems Review of Systems  Musculoskeletal:       Right knee pain.  Skin: Positive for wound.   Physical Exam Triage Vital Signs ED Triage Vitals  Enc Vitals Group     BP 09/29/19 1123 126/79     Pulse Rate 09/29/19 1123 80     Resp 09/29/19 1123 16     Temp 09/29/19 1123 98.4 F (36.9 C)     Temp Source 09/29/19 1123 Oral     SpO2 09/29/19 1123 96 %  Weight 09/29/19 1121 205 lb (93 kg)     Height 09/29/19 1121 6' (1.829 m)     Head Circumference --      Peak Flow --      Pain Score 09/29/19 1120 7     Pain Loc --      Pain Edu? --      Excl. in [place]? --    Updated Vital Signs BP 126/79 (BP Location: Right Arm)   Pulse 80   Temp 98.4 F (36.9 C) (Oral)   Resp 16   Ht 6' (1.829 m)   Wt 93 kg   SpO2 96%   BMI 27.80 kg/m   Visual Acuity Right Eye Distance:   Left Eye Distance:   Bilateral Distance:    Right Eye Near:   Left Eye Near:    Bilateral Near:     Physical Exam Vitals and nursing note reviewed.  Constitutional:      General: He is not in acute distress.    Appearance: Normal appearance. He is not ill-appearing.  HENT:     Head: Normocephalic and atraumatic.  Eyes:     General:        Right eye: No discharge.        Left eye: No discharge.     Conjunctiva/sclera: Conjunctivae normal.  Cardiovascular:     Rate and Rhythm: Normal rate and regular rhythm.  Pulmonary:     Effort: Pulmonary effort is normal.     Breath sounds: Normal breath sounds.  Musculoskeletal:     Comments: Right knee -abrasions noted.  No joint line tenderness.  Ligaments intact.  Skin:    Comments: 2 abrasions noted to the right anterior knee.  Neurological:     Mental Status: He is alert.  Psychiatric:         Mood and Affect: Mood normal.        Behavior: Behavior normal.     UC Treatments / Results  Labs (all labs ordered are listed, but only abnormal results are displayed) Labs Reviewed - No data to display  EKG   Radiology DG Knee Complete 4 Views Right  Result Date: 09/29/2019 CLINICAL DATA:  Fall on Friday, landing on RIGHT knee. Ongoing pain. EXAM: RIGHT KNEE - COMPLETE 4+ VIEW COMPARISON:  Plain film of the RIGHT knee dated 09/16/2013. FINDINGS: No evidence of fracture, dislocation, or joint effusion. No evidence of arthropathy or other focal bone abnormality. Soft tissues are unremarkable. IMPRESSION: Negative. Electronically Signed   By: Franki Cabot M.D.   On: 09/29/2019 12:32    Procedures Procedures (including critical care time)  Medications Ordered in UC Medications - No data to display  Initial Impression / Assessment and Plan / UC Course  I have reviewed the triage vital signs and the nursing notes.  Pertinent labs & imaging results that were available during my care of the patient were reviewed by me and considered in my medical decision making (see chart for details).    84 year old male presents with knee pain and abrasions after suffering a fall.  X-ray negative.  Advised daily dressing changes.  Over-the-counter analgesics as needed.  Supportive care.  Final Clinical Impressions(s) / UC Diagnoses   Final diagnoses:  Acute pain of right knee  Abrasion of right knee, initial encounter     Discharge Instructions     Rest.  Change dressing daily.  Take care  Dr. Lacinda Axon    ED Prescriptions    None  PDMP not reviewed this encounter.   Coral Spikes, DO 09/29/19 1340

## 2019-09-29 NOTE — Discharge Instructions (Signed)
Rest.  Change dressing daily.  Take care  Dr. Lacinda Axon

## 2019-10-01 DIAGNOSIS — L821 Other seborrheic keratosis: Secondary | ICD-10-CM | POA: Diagnosis not present

## 2019-10-01 DIAGNOSIS — L570 Actinic keratosis: Secondary | ICD-10-CM | POA: Diagnosis not present

## 2019-10-01 DIAGNOSIS — L820 Inflamed seborrheic keratosis: Secondary | ICD-10-CM | POA: Diagnosis not present

## 2019-10-08 ENCOUNTER — Ambulatory Visit (INDEPENDENT_AMBULATORY_CARE_PROVIDER_SITE_OTHER): Payer: Medicare Other | Admitting: Family Medicine

## 2019-10-08 ENCOUNTER — Other Ambulatory Visit: Payer: Self-pay

## 2019-10-08 ENCOUNTER — Encounter: Payer: Self-pay | Admitting: Family Medicine

## 2019-10-08 VITALS — BP 108/65 | HR 84 | Temp 97.8°F | Resp 16 | Ht 72.0 in | Wt 211.6 lb

## 2019-10-08 DIAGNOSIS — R58 Hemorrhage, not elsewhere classified: Secondary | ICD-10-CM

## 2019-10-08 NOTE — Patient Instructions (Addendum)
Thank you for coming to the office today.  It looks like the area in question is bruising from aspirin.  It does not look like infection. No spreading redness. This is more patchy and bruise-like. It appears to be under skin, not a superficial rash.  No dry or flaky skin, does not look fungal or raised or eczema rash.  HOLD aspirin completely for next 3-5 days to see if it helps  Use ice packs and topical tea tree oil is fine if you want for anti inflammatory  I will look at blood from [place]  We may re-check a blood count / platelet test in next 1-2 weeks can call you if need this.  If not improving or spreading, you can contact Dr Keitha Butte to have Dermatology evaluate it next if not resolving   Please schedule a Follow-up Appointment to: Return in about 2 weeks (around 10/22/2019), or if symptoms worsen or fail to improve, for bruising / rash.  If you have any other questions or concerns, please feel free to call the office or send a message through [place]. You may also schedule an earlier appointment if necessary.  Additionally, you may be receiving a survey about your experience at our office within a few days to 1 week by e-mail or mail. We value your feedback.  [name], DO [place]

## 2019-10-08 NOTE — Progress Notes (Signed)
Subjective:    Patient ID: Haralambos Demirjian, male    DOB: 1936-05-14, 84 y.o.   MRN: [id number]  Sair Karau is a 84 y.o. male presenting on 10/08/2019 for Rash (left leg onset 6 days)   HPI   Rash / Ecchysmosis / Left Upper Anterior Thigh Reports left upper leg with initially 1 week ago with residual rash on lower leg with fungal rash, treated by podiatry with topical foot cream he used on his leg and it resolved. He then has developed in past 1 week red erythematous rash on left upper leg. He has not used any topicals on it at this time. He takes Baby aspirin 81mg  x 2 at night. For prevention cardiovascular disease only Previous [facility] Dermat[facility] Dr Keitha Butte has not seen for this issue before. Has not been recently Reviewed records from [place] 05/2019 Hgb 10.6, plt 189 Denies skin pain, itching, dry skin flaking spreading redness, new fall or injury  Depression screen [facility] 2/9 03/20/2019 02/05/2019 09/05/2018  Decreased Interest 0 0 0  Down, Depressed, Hopeless 0 0 0  PHQ - 2 Score 0 0 0    Social History   Tobacco Use  . Smoking status: Never Smoker  . Smokeless tobacco: Never Used  Substance Use Topics  . Alcohol use: Yes    Alcohol/week: 0.0 standard drinks    Comment: wine in the evening  . Drug use: No    Review of Systems Per HPI unless specifically indicated above     Objective:    BP 108/65   Pulse 84   Temp 97.8 F (36.6 C) (Temporal)   Resp 16   Ht 6' (1.829 m)   Wt 211 lb 9.6 oz (96 kg)   BMI 28.70 kg/m   Wt Readings from Last 3 Encounters:  10/08/19 211 lb 9.6 oz (96 kg)  09/29/19 205 lb (93 kg)  09/22/19 205 lb (93 kg)    Physical Exam Vitals and nursing note reviewed.  Constitutional:      General: He is not in acute distress.    Appearance: He is well-developed. He is not diaphoretic.     Comments: Well-appearing, comfortable, cooperative  HENT:     Head: Normocephalic and atraumatic.  Eyes:     General:        Right eye: No discharge.      Left eye: No discharge.     Conjunctiva/sclera: Conjunctivae normal.  Cardiovascular:     Rate and Rhythm: Normal rate.  Pulmonary:     Effort: Pulmonary effort is normal.  Musculoskeletal:     Comments: AFO Bilateral Lower Leg Braces in place, using Cane for ambulation assistance   S/p L great toe amputation  Skin:    General: Skin is warm and dry.     Findings: Bruising (left anterior thigh) present. No erythema.     Comments: Healing superficial abrasions bilateral knees  Neurological:     Mental Status: He is alert and oriented to person, place, and time.  Psychiatric:        Behavior: Behavior normal.     Comments: Well groomed, good eye contact, normal speech and thoughts      Left Upper Thigh anterior Non blanching, ecchymosis scattered. Some mild induration with possible early hematoma but mild, non tender.       CBC:    Component Value Date/Time   WBC 8.0 02/01/2019 1349   HGB 12.8 (L) 02/01/2019 1349   HGB 12.7 09/29/2015 0845   HCT  34.8 (L) 02/01/2019 1349   HCT 37.9 09/29/2015 0845   PLT 225 02/01/2019 1349   PLT 263 09/29/2015 0845   MCV 102.4 (H) 02/01/2019 1349   MCV 105 (H) 09/29/2015 0845   MCV 107 (H) 01/04/2012 1053   NEUTROABS 6.2 02/01/2019 1349   NEUTROABS 1.7 09/29/2015 0845   NEUTROABS 2.0 01/04/2012 1053   LYMPHSABS 1.2 02/01/2019 1349   LYMPHSABS 1.4 09/29/2015 0845   LYMPHSABS 1.2 01/04/2012 1053   MONOABS 0.4 02/01/2019 1349   MONOABS 0.4 01/04/2012 1053   EOSABS 0.1 02/01/2019 1349   EOSABS 0.1 09/29/2015 0845   EOSABS 0.1 01/04/2012 1053   BASOSABS 0.0 02/01/2019 1349   BASOSABS 0.0 09/29/2015 0845   BASOSABS 0.0 01/04/2012 1053      Results for orders placed or performed in visit on 03/15/19  Hemoglobin A1c  Result Value Ref Range   Hgb A1c MFr Bld 4.7 <5.7 % of total Hgb   Mean Plasma Glucose 88 (calc)   eAG (mmol/L) 4.9 (calc)  BASIC METABOLIC PANEL WITH GFR  Result Value Ref Range   Glucose, Bld 98 65 - 99 mg/dL    BUN 12 7 - 25 mg/dL   Creat 0.80 0.70 - 1.11 mg/dL   GFR, Est Non African American 84 > OR = 60 mL/min/1.8m2   GFR, Est African American 97 > OR = 60 mL/min/1.55m2   BUN/Creatinine Ratio NOT APPLICABLE 6 - 22 (calc)   Sodium 133 (L) 135 - 146 mmol/L   Potassium 3.8 3.5 - 5.3 mmol/L   Chloride 93 (L) 98 - 110 mmol/L   CO2 28 20 - 32 mmol/L   Calcium 9.6 8.6 - 10.3 mg/dL      Assessment & Plan:   Problem List Items Addressed This Visit    None    Visit Diagnoses    Ecchymosis    -  Primary      Uncertain exact etiology, seems to be non traumatic ecchymosis with mild early hematoma formation possible with induration centrally, some extended ecchymosis - No sign erythema. No sign fungal infection or superficial rash. No dry skin or raised lesions. No oozing or drainage - No sign of bacterial infection or cellulitis  Relatively asymptomatic. Non tender. No itching. He has some sensation in this area normally.  On Aspirin 81mg  x 2 nightly - may be cause. He did have recent traumatic fall and injuries, and strains his legs every day with activity but no new reported trauma to this area.  Reviewed labs from [place] outside provider, to be scanned, Hgb 10.6 and Plt 189 (05/2019)  Reassurance Stop Aspirin for 3-5 days maybe longer, can resume x 1 nightly if prefer Use ice packs as needed Can use topical tea tree oil if prefer - caution avoid overuse can cause skin burn  Follow-up with [place] within 1-2 weeks if not improving, consider repeat CBC lab. He can check with [facility]at[facility] if worsening or new concerns.  No orders of the defined types were placed in this encounter.     Follow up plan: Return in about 2 weeks (around 10/22/2019), or if symptoms worsen or fail to improve, for bruising / rash.   [name], [facility] 10/08/2019, 11:45 AM

## 2019-10-21 ENCOUNTER — Other Ambulatory Visit: Payer: Self-pay

## 2019-10-21 ENCOUNTER — Ambulatory Visit (INDEPENDENT_AMBULATORY_CARE_PROVIDER_SITE_OTHER): Payer: Medicare Other | Admitting: Family Medicine

## 2019-10-21 ENCOUNTER — Encounter: Payer: Self-pay | Admitting: Family Medicine

## 2019-10-21 VITALS — BP 135/74 | HR 89 | Temp 97.5°F | Resp 16 | Ht 72.0 in | Wt 203.6 lb

## 2019-10-21 DIAGNOSIS — R58 Hemorrhage, not elsewhere classified: Secondary | ICD-10-CM
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
## 2019-10-21 NOTE — Patient Instructions (Addendum)
Thank you for coming to the office today.  Remain off baby Aspirin for now. If interested can restart only ONE A DAY Aspirin 81mg . Not 2.  Can use Tea Tree Oil as needed.  Skin rash and bruising look great.  Keep up the good work.  Safe travels and we can do blood work in July 2021.  DUE for FASTING BLOOD WORK (no food or drink after midnight before the lab appointment, only water or coffee without cream/sugar on the morning of)  SCHEDULE "Lab Only" visit in the morning at the clinic for lab draw in 2 MONTHS   - Make sure Lab Only appointment is at about 1 week before your next appointment, so that results will be available  For Lab Results, once available within 2-3 days of blood draw, you can can log in to MyChart online to view your results and a brief explanation. Also, we can discuss results at next follow-up visit.   Please schedule a Follow-up Appointment to: Return in about 2 months (around 12/21/2019) for 2 month follow-up lab results follow-up HTN.  If you have any other questions or concerns, please feel free to call the office or send a message through [place]. You may also schedule an earlier appointment if necessary.  Additionally, you may be receiving a survey about your experience at our office within a few days to 1 week by e-mail or mail. We value your feedback.  [name], DO [place]

## 2019-10-23 DIAGNOSIS — S0190XA Unspecified open wound of unspecified part of head, initial encounter: Secondary | ICD-10-CM | POA: Diagnosis not present

## 2019-10-23 DIAGNOSIS — S60450A Superficial foreign body of right index finger, initial encounter: Secondary | ICD-10-CM | POA: Diagnosis not present

## 2019-10-23 DIAGNOSIS — S199XXA Unspecified injury of neck, initial encounter: Secondary | ICD-10-CM | POA: Diagnosis not present

## 2019-10-23 DIAGNOSIS — M25531 Pain in right wrist: Secondary | ICD-10-CM | POA: Diagnosis not present

## 2019-10-23 DIAGNOSIS — Z79899 Other long term (current) drug therapy: Secondary | ICD-10-CM | POA: Diagnosis not present

## 2019-10-23 DIAGNOSIS — Z23 Encounter for immunization: Secondary | ICD-10-CM | POA: Diagnosis not present

## 2019-10-23 DIAGNOSIS — G629 Polyneuropathy, unspecified: Secondary | ICD-10-CM | POA: Diagnosis not present

## 2019-10-23 DIAGNOSIS — W010XXA Fall on same level from slipping, tripping and stumbling without subsequent striking against object, initial encounter: Secondary | ICD-10-CM | POA: Diagnosis not present

## 2019-10-23 DIAGNOSIS — S299XXA Unspecified injury of thorax, initial encounter: Secondary | ICD-10-CM | POA: Diagnosis not present

## 2019-10-23 DIAGNOSIS — R937 Abnormal findings on diagnostic imaging of other parts of musculoskeletal system: Secondary | ICD-10-CM | POA: Diagnosis not present

## 2019-10-23 DIAGNOSIS — Z5181 Encounter for therapeutic drug level monitoring: Secondary | ICD-10-CM | POA: Diagnosis not present

## 2019-10-23 DIAGNOSIS — S0001XA Abrasion of scalp, initial encounter: Secondary | ICD-10-CM | POA: Diagnosis not present

## 2019-10-23 DIAGNOSIS — S0003XA Contusion of scalp, initial encounter: Secondary | ICD-10-CM | POA: Diagnosis not present

## 2019-10-23 DIAGNOSIS — E871 Hypo-osmolality and hyponatremia: Secondary | ICD-10-CM | POA: Diagnosis not present

## 2019-10-23 DIAGNOSIS — S8012XA Contusion of left lower leg, initial encounter: Secondary | ICD-10-CM | POA: Diagnosis not present

## 2019-10-23 DIAGNOSIS — S8011XA Contusion of right lower leg, initial encounter: Secondary | ICD-10-CM | POA: Diagnosis not present

## 2019-10-23 DIAGNOSIS — W19XXXA Unspecified fall, initial encounter: Secondary | ICD-10-CM | POA: Diagnosis not present

## 2019-10-23 DIAGNOSIS — I10 Essential (primary) hypertension: Secondary | ICD-10-CM | POA: Diagnosis not present

## 2019-10-23 DIAGNOSIS — S40022A Contusion of left upper arm, initial encounter: Secondary | ICD-10-CM | POA: Diagnosis not present

## 2019-10-23 DIAGNOSIS — R58 Hemorrhage, not elsewhere classified: Secondary | ICD-10-CM | POA: Diagnosis not present

## 2019-10-23 DIAGNOSIS — R9431 Abnormal electrocardiogram [ECG] [EKG]: Secondary | ICD-10-CM | POA: Diagnosis not present

## 2019-10-23 DIAGNOSIS — S60211A Contusion of right wrist, initial encounter: Secondary | ICD-10-CM | POA: Diagnosis not present

## 2019-10-23 DIAGNOSIS — S61511A Laceration without foreign body of right wrist, initial encounter: Secondary | ICD-10-CM | POA: Diagnosis not present

## 2019-10-23 DIAGNOSIS — Z7982 Long term (current) use of aspirin: Secondary | ICD-10-CM | POA: Diagnosis not present

## 2019-10-23 DIAGNOSIS — Y92520 Airport as the place of occurrence of the external cause: Secondary | ICD-10-CM | POA: Diagnosis not present

## 2019-10-23 DIAGNOSIS — S098XXA Other specified injuries of head, initial encounter: Secondary | ICD-10-CM | POA: Diagnosis not present

## 2019-10-23 DIAGNOSIS — E876 Hypokalemia: Secondary | ICD-10-CM | POA: Diagnosis not present

## 2019-10-30 DIAGNOSIS — B965 Pseudomonas (aeruginosa) (mallei) (pseudomallei) as the cause of diseases classified elsewhere: Secondary | ICD-10-CM | POA: Diagnosis not present

## 2019-10-30 DIAGNOSIS — B353 Tinea pedis: Secondary | ICD-10-CM | POA: Diagnosis not present

## 2019-10-30 DIAGNOSIS — L97521 Non-pressure chronic ulcer of other part of left foot limited to breakdown of skin: Secondary | ICD-10-CM | POA: Diagnosis not present

## 2019-10-30 DIAGNOSIS — Z6827 Body mass index (BMI) 27.0-27.9, adult: Secondary | ICD-10-CM | POA: Diagnosis not present

## 2019-10-30 DIAGNOSIS — R2689 Other abnormalities of gait and mobility: Secondary | ICD-10-CM | POA: Diagnosis not present

## 2019-10-30 DIAGNOSIS — L089 Local infection of the skin and subcutaneous tissue, unspecified: Secondary | ICD-10-CM | POA: Diagnosis not present

## 2019-11-06 DIAGNOSIS — B353 Tinea pedis: Secondary | ICD-10-CM | POA: Diagnosis not present

## 2019-11-06 DIAGNOSIS — Z6827 Body mass index (BMI) 27.0-27.9, adult: Secondary | ICD-10-CM | POA: Diagnosis not present

## 2019-11-06 DIAGNOSIS — L97521 Non-pressure chronic ulcer of other part of left foot limited to breakdown of skin: Secondary | ICD-10-CM | POA: Diagnosis not present

## 2019-11-06 DIAGNOSIS — R2689 Other abnormalities of gait and mobility: Secondary | ICD-10-CM | POA: Diagnosis not present

## 2019-11-18 DIAGNOSIS — R2689 Other abnormalities of gait and mobility: Secondary | ICD-10-CM | POA: Diagnosis not present

## 2019-11-18 DIAGNOSIS — L97521 Non-pressure chronic ulcer of other part of left foot limited to breakdown of skin: Secondary | ICD-10-CM | POA: Diagnosis not present

## 2019-11-18 DIAGNOSIS — B353 Tinea pedis: Secondary | ICD-10-CM | POA: Diagnosis not present

## 2019-11-18 DIAGNOSIS — Z6827 Body mass index (BMI) 27.0-27.9, adult: Secondary | ICD-10-CM | POA: Diagnosis not present

## 2019-11-27 DIAGNOSIS — Z6827 Body mass index (BMI) 27.0-27.9, adult: Secondary | ICD-10-CM | POA: Diagnosis not present

## 2019-11-27 DIAGNOSIS — B353 Tinea pedis: Secondary | ICD-10-CM | POA: Diagnosis not present

## 2019-11-27 DIAGNOSIS — R2689 Other abnormalities of gait and mobility: Secondary | ICD-10-CM | POA: Diagnosis not present

## 2019-11-27 DIAGNOSIS — L97521 Non-pressure chronic ulcer of other part of left foot limited to breakdown of skin: Secondary | ICD-10-CM | POA: Diagnosis not present

## 2019-12-02 ENCOUNTER — Other Ambulatory Visit: Payer: Self-pay | Admitting: Family Medicine

## 2019-12-02 DIAGNOSIS — E876 Hypokalemia: Secondary | ICD-10-CM

## 2019-12-02 NOTE — Telephone Encounter (Signed)
Requested medications are due for refill today?  Unknown - listed as a historical medication  Requested medications are on active medication list?  Yes - listed as historical medication.    Last Refill:  Unknown  Future visit scheduled?  Yes in 3 weeks.    Notes to Clinic:  Listed as historical medication.

## 2019-12-12 ENCOUNTER — Other Ambulatory Visit: Payer: Medicare Other

## 2019-12-12 DIAGNOSIS — Z8546 Personal history of malignant neoplasm of prostate: Secondary | ICD-10-CM | POA: Diagnosis not present

## 2019-12-12 DIAGNOSIS — E782 Mixed hyperlipidemia: Secondary | ICD-10-CM | POA: Diagnosis not present

## 2019-12-12 DIAGNOSIS — I10 Essential (primary) hypertension: Secondary | ICD-10-CM | POA: Diagnosis not present

## 2019-12-12 DIAGNOSIS — D539 Nutritional anemia, unspecified: Secondary | ICD-10-CM | POA: Diagnosis not present

## 2019-12-12 DIAGNOSIS — E871 Hypo-osmolality and hyponatremia: Secondary | ICD-10-CM | POA: Diagnosis not present

## 2019-12-12 DIAGNOSIS — R7309 Other abnormal glucose: Secondary | ICD-10-CM | POA: Diagnosis not present

## 2019-12-13 LAB — CBC WITH DIFFERENTIAL/PLATELET
Absolute Monocytes: 456 cells/uL (ref 200–950)
Basophils Absolute: 19 cells/uL (ref 0–200)
Basophils Relative: 0.4 %
Eosinophils Absolute: 82 cells/uL (ref 15–500)
Eosinophils Relative: 1.7 %
HCT: 36.2 % — ABNORMAL LOW (ref 38.5–50.0)
Hemoglobin: 12.2 g/dL — ABNORMAL LOW (ref 13.2–17.1)
Lymphs Abs: 1344 cells/uL (ref 850–3900)
MCH: 36.9 pg — ABNORMAL HIGH (ref 27.0–33.0)
MCHC: 33.7 g/dL (ref 32.0–36.0)
MCV: 109.4 fL — ABNORMAL HIGH (ref 80.0–100.0)
MPV: 9 fL (ref 7.5–12.5)
Monocytes Relative: 9.5 %
Neutro Abs: 2899 cells/uL (ref 1500–7800)
Neutrophils Relative %: 60.4 %
Platelets: 216 10*3/uL (ref 140–400)
RBC: 3.31 10*6/uL — ABNORMAL LOW (ref 4.20–5.80)
RDW: 12.5 % (ref 11.0–15.0)
Total Lymphocyte: 28 %
WBC: 4.8 10*3/uL (ref 3.8–10.8)

## 2019-12-13 LAB — PSA: PSA: <0.1 ng/mL (ref <=4.0)

## 2019-12-13 LAB — TSH: TSH: 0.81 mIU/L (ref 0.40–4.50)

## 2019-12-13 LAB — LIPID PANEL
Cholesterol: 157 mg/dL (ref <200)
HDL: 94 mg/dL (ref >=40)
LDL Cholesterol (Calc): 46 mg/dL (calc)
Non-HDL Cholesterol (Calc): 63 mg/dL (calc) (ref <130)
Total CHOL/HDL Ratio: 1.7 (calc) (ref <5.0)
Triglycerides: 89 mg/dL (ref <150)

## 2019-12-13 LAB — COMPLETE METABOLIC PANEL WITH GFR
AG Ratio: 1.6 (calc) (ref 1.0–2.5)
ALT: 22 U/L (ref 9–46)
AST: 24 U/L (ref 10–35)
Albumin: 4.5 g/dL (ref 3.6–5.1)
Alkaline phosphatase (APISO): 66 U/L (ref 35–144)
BUN: 12 mg/dL (ref 7–25)
CO2: 27 mmol/L (ref 20–32)
Calcium: 9.8 mg/dL (ref 8.6–10.3)
Chloride: 92 mmol/L — ABNORMAL LOW (ref 98–110)
Creat: 0.8 mg/dL (ref 0.70–1.11)
GFR, Est African American: 96 mL/min/{1.73_m2} (ref >=60)
GFR, Est Non African American: 83 mL/min/{1.73_m2} (ref >=60)
Globulin: 2.8 g/dL (calc) (ref 1.9–3.7)
Glucose, Bld: 107 mg/dL — ABNORMAL HIGH (ref 65–99)
Potassium: 3.6 mmol/L (ref 3.5–5.3)
Sodium: 129 mmol/L — ABNORMAL LOW (ref 135–146)
Total Bilirubin: 0.9 mg/dL (ref 0.2–1.2)
Total Protein: 7.3 g/dL (ref 6.1–8.1)

## 2019-12-13 LAB — HEMOGLOBIN A1C
Hgb A1c MFr Bld: 4.7 % of total Hgb (ref <5.7)
Mean Plasma Glucose: 88 (calc)
eAG (mmol/L): 4.9 (calc)

## 2019-12-17 DIAGNOSIS — D0472 Carcinoma in situ of skin of left lower limb, including hip: Secondary | ICD-10-CM | POA: Diagnosis not present

## 2019-12-17 DIAGNOSIS — C44629 Squamous cell carcinoma of skin of left upper limb, including shoulder: Secondary | ICD-10-CM | POA: Diagnosis not present

## 2019-12-19 ENCOUNTER — Other Ambulatory Visit: Payer: Self-pay | Admitting: Family Medicine

## 2019-12-19 ENCOUNTER — Ambulatory Visit (INDEPENDENT_AMBULATORY_CARE_PROVIDER_SITE_OTHER): Payer: Medicare Other | Admitting: Family Medicine

## 2019-12-19 ENCOUNTER — Encounter: Payer: Self-pay | Admitting: Family Medicine

## 2019-12-19 ENCOUNTER — Other Ambulatory Visit: Payer: Self-pay

## 2019-12-19 VITALS — BP 105/59 | HR 92 | Temp 97.8°F | Resp 16 | Ht 72.0 in | Wt 200.0 lb

## 2019-12-19 DIAGNOSIS — E871 Hypo-osmolality and hyponatremia: Secondary | ICD-10-CM

## 2019-12-19 DIAGNOSIS — I5022 Chronic systolic (congestive) heart failure: Secondary | ICD-10-CM

## 2019-12-19 DIAGNOSIS — I10 Essential (primary) hypertension: Secondary | ICD-10-CM | POA: Diagnosis not present

## 2019-12-19 DIAGNOSIS — D539 Nutritional anemia, unspecified: Secondary | ICD-10-CM

## 2019-12-19 DIAGNOSIS — Z8546 Personal history of malignant neoplasm of prostate: Secondary | ICD-10-CM

## 2019-12-19 DIAGNOSIS — M159 Polyosteoarthritis, unspecified: Secondary | ICD-10-CM

## 2019-12-19 DIAGNOSIS — E876 Hypokalemia: Secondary | ICD-10-CM

## 2019-12-19 DIAGNOSIS — M8949 Other hypertrophic osteoarthropathy, multiple sites: Secondary | ICD-10-CM | POA: Diagnosis not present

## 2019-12-19 NOTE — Assessment & Plan Note (Signed)
Controlled Low normal BP - previously increased thiazide per PCP in [place] - Home BP readings normal  No known complications Followed by Cardiology [facility]  Plan:  1. Continue current BP regimen - Losartan, HCTZ 25 BID (due to swelling), Amlodipine, Lasix, K supplement - INCREASE Potassium 43mEq to BID every day now - no new rx yet 2. Encourage improved lifestyle - low sodium diet, regular exercise 3. Continue monitor BP outside office, bring readings to next visit, if persistently >140/90 or new symptoms notify office sooner

## 2019-12-19 NOTE — Progress Notes (Signed)
Subjective:    Patient ID: [name], male    DOB: 09-Jun-1936, 84 y.o.   MRN: [id number]  [name] is a 84 y.o. male presenting on 12/19/2019 for Hypertension   HPI   CHRONIC HYPONATREMIA/ Hypertension / CHF Systolic  Chronic problem. Na to 129 prior 131-133 - Cr normal range - Continues to take HCTZ 25mg  BID, Amlodipine 2.5mg , Losartan 50 - On Lasix Diuretic - Other electrolyte imbalance with concern low K, which has improved, now on higher dose K alternating dose but still slightly lower K  Upcoming cardiology next week   Macrocytic anemia On M57 and Folic Acid  Bilateral Foot Drop / Chronic Neuropathy He uses AFO brace Left ankle/foot for stability with ambulation. From [employer] Neuropathy, no sensation below knees PT improving 2 x week, helping core str but not balance - interested in further treatment in future maybe refer  Coughing episodes Sinusitis allergies Uses allergy pill and nasal steroid Zyrtec and Nasocort - thought maybe allergies, drinks water it helps  Health Maintenance: UTD COVID19 vaccine, entered Goal to get Flu Vaccine before leaves in October.  History prostate cancer S/p surgical resection PSA < 0.1 negative.  Depression screen [facility] 2/9 12/19/2019 03/20/2019 02/05/2019  Decreased Interest 0 0 0  Down, Depressed, Hopeless 0 0 0  PHQ - 2 Score 0 0 0    Social History   Tobacco Use  . Smoking status: Never Smoker  . Smokeless tobacco: Never Used  Vaping Use  . Vaping Use: Never used  Substance Use Topics  . Alcohol use: Yes    Alcohol/week: 0.0 standard drinks    Comment: wine in the evening  . Drug use: No    Review of Systems Per HPI unless specifically indicated above     Objective:    BP (!) 105/59   Pulse 92   Temp 97.8 F (36.6 C) (Temporal)   Resp 16   Ht 6' (1.829 m)   Wt 200 lb (90.7 kg)   SpO2 96%   BMI 27.12 kg/m   Wt Readings from Last 3 Encounters:  12/19/19 200 lb (90.7 kg)  10/21/19 203 lb 9.6 oz (92.4  kg)  10/08/19 211 lb 9.6 oz (96 kg)    Physical Exam Vitals and nursing note reviewed.  Constitutional:      General: He is not in acute distress.    Appearance: He is well-developed. He is not diaphoretic.     Comments: Well-appearing, comfortable, cooperative  HENT:     Head: Normocephalic and atraumatic.  Eyes:     General:        Right eye: No discharge.        Left eye: No discharge.     Conjunctiva/sclera: Conjunctivae normal.  Cardiovascular:     Rate and Rhythm: Normal rate.  Pulmonary:     Effort: Pulmonary effort is normal.  Musculoskeletal:     Comments: AFO Bilateral Lower Leg Braces in place, using Cane for ambulation assistance  Skin:    General: Skin is warm and dry.     Findings: No bruising, erythema or rash.  Neurological:     Mental Status: He is alert and oriented to person, place, and time.  Psychiatric:        Behavior: Behavior normal.     Comments: Well groomed, good eye contact, normal speech and thoughts       Results for orders placed or performed in visit on 12/12/19  TSH  Result Value Ref Range  TSH 0.81 0.40 - 4.50 mIU/L  PSA  Result Value Ref Range   PSA <0.1 < OR = 4.0 ng/mL  Lipid panel  Result Value Ref Range   Cholesterol 157 <200 mg/dL   HDL 94 > OR = 40 mg/dL   Triglycerides 89 <150 mg/dL   LDL Cholesterol (Calc) 46 mg/dL (calc)   Total CHOL/HDL Ratio 1.7 <5.0 (calc)   Non-HDL Cholesterol (Calc) 63 <130 mg/dL (calc)  COMPLETE METABOLIC PANEL WITH GFR  Result Value Ref Range   Glucose, Bld 107 (H) 65 - 99 mg/dL   BUN 12 7 - 25 mg/dL   Creat 0.80 0.70 - 1.11 mg/dL   GFR, Est Non African American 83 > OR = 60 mL/min/1.27m2   GFR, Est African American 96 > OR = 60 mL/min/1.55m2   BUN/Creatinine Ratio NOT APPLICABLE 6 - 22 (calc)   Sodium 129 (L) 135 - 146 mmol/L   Potassium 3.6 3.5 - 5.3 mmol/L   Chloride 92 (L) 98 - 110 mmol/L   CO2 27 20 - 32 mmol/L   Calcium 9.8 8.6 - 10.3 mg/dL   Total Protein 7.3 6.1 - 8.1 g/dL    Albumin 4.5 3.6 - 5.1 g/dL   Globulin 2.8 1.9 - 3.7 g/dL (calc)   AG Ratio 1.6 1.0 - 2.5 (calc)   Total Bilirubin 0.9 0.2 - 1.2 mg/dL   Alkaline phosphatase (APISO) 66 35 - 144 U/L   AST 24 10 - 35 U/L   ALT 22 9 - 46 U/L  CBC with Differential/Platelet  Result Value Ref Range   WBC 4.8 3.8 - 10.8 Thousand/uL   RBC 3.31 (L) 4.20 - 5.80 Million/uL   Hemoglobin 12.2 (L) 13.2 - 17.1 g/dL   HCT 36.2 (L) 38 - 50 %   MCV 109.4 (H) 80.0 - 100.0 fL   MCH 36.9 (H) 27.0 - 33.0 pg   MCHC 33.7 32.0 - 36.0 g/dL   RDW 12.5 11.0 - 15.0 %   Platelets 216 140 - 400 Thousand/uL   MPV 9.0 7.5 - 12.5 fL   Neutro Abs 2,899 1,500 - 7,800 cells/uL   Lymphs Abs 1,344 850 - 3,900 cells/uL   Absolute Monocytes 456 200 - 950 cells/uL   Eosinophils Absolute 82 15 - 500 cells/uL   Basophils Absolute 19 0 - 200 cells/uL   Neutrophils Relative % 60.4 %   Total Lymphocyte 28.0 %   Monocytes Relative 9.5 %   Eosinophils Relative 1.7 %   Basophils Relative 0.4 %  Hemoglobin A1c  Result Value Ref Range   Hgb A1c MFr Bld 4.7 <5.7 % of total Hgb   Mean Plasma Glucose 88 (calc)   eAG (mmol/L) 4.9 (calc)      Assessment & Plan:   Problem List Items Addressed This Visit    Osteoarthritis of multiple joints - Primary    Stable chronic problem Joint pain stiffness On NSAID, topical NSAID      Macrocytic anemia    Stable, without new symptoms or new etiology Chronic problem based on labs Stable anemia panel, not iron deficiency  Plan On Folic Acid and Vit Y65 Continue supplements, does not need iron supplement Recheck cbc 2-3 month      History of prostate cancer    Stable without evidence of recurrence PSA < 0.1, negative      Essential hypertension    Controlled Low normal BP - previously increased thiazide per PCP in [place] - Home BP readings normal  No known  complications Followed by Cardiology [facility]  Plan:  1. Continue current BP regimen - Losartan, HCTZ 25 BID (due to swelling), Amlodipine,  Lasix, K supplement - INCREASE Potassium 87mEq to BID every day now - no new rx yet 2. Encourage improved lifestyle - low sodium diet, regular exercise 3. Continue monitor BP outside office, bring readings to next visit, if persistently >140/90 or new symptoms notify office sooner      Chronic systolic CHF (congestive heart failure), NYHA class 2 (HCC)    Stable chronic CHF, remains euvolemic Continues med management for fluid with Lasix 20mg  daily and thiazide BID now INCREASE K to BID every day 61meq On ARB Followed by Cardiology      Chronic hyponatremia    Stable chronic problem Remains on HCTZ and lasix K supplement Continue modifications, some sodium in diet, limit excess free water Re-check in 2-3 months       Other Visit Diagnoses    Hypokalemia       Relevant Medications   potassium chloride SA (KLOR-CON) 20 MEQ tablet      No orders of the defined types were placed in this encounter.   Follow up plan: Return in about 3 months (around 03/20/2020) for 2-3 month HTN, Sodium, lab results.  Future labs ordered for BMET, CBC 03/13/20 approx  [name], DO [facility] 12/19/2019, 11:40 AM

## 2019-12-19 NOTE — Assessment & Plan Note (Signed)
Stable chronic problem Joint pain stiffness On NSAID, topical NSAID

## 2019-12-19 NOTE — Assessment & Plan Note (Signed)
Stable chronic problem Remains on HCTZ and lasix K supplement Continue modifications, some sodium in diet, limit excess free water Re-check in 2-3 months

## 2019-12-19 NOTE — Assessment & Plan Note (Signed)
Stable without evidence of recurrence PSA < 0.1, negative

## 2019-12-19 NOTE — Assessment & Plan Note (Addendum)
Stable chronic CHF, remains euvolemic Continues med management for fluid with Lasix 20mg  daily and thiazide BID now INCREASE K to BID every day 4meq On ARB Followed by Cardiology

## 2019-12-19 NOTE — Patient Instructions (Addendum)
Thank you for coming to the office today.  For the cough, likely from sinus drainage in back of throat. Keep on current zyrtec, nasocort.  You may try the Azelastine (Astelin) allergy anti histamine nasal spray rx - if interested, just let me know and I can order it.  Mild anemia. Keep on current medicines  Potassium twice every day now. Don't alternate some days only 1. See if this helps maintain potassium.   Please schedule a Follow-up Appointment to: Return in about 3 months (around 03/20/2020) for 2-3 month HTN, Sodium, lab results.  If you have any other questions or concerns, please feel free to call the office or send a message through [place]. You may also schedule an earlier appointment if necessary.  Additionally, you may be receiving a survey about your experience at our office within a few days to 1 week by e-mail or mail. We value your feedback.  [name], DO [place]

## 2019-12-19 NOTE — Assessment & Plan Note (Signed)
Stable, without new symptoms or new etiology Chronic problem based on labs Stable anemia panel, not iron deficiency  Plan On Folic Acid and Vit V74 Continue supplements, does not need iron supplement Recheck cbc 2-3 month

## 2019-12-23 ENCOUNTER — Telehealth: Payer: Self-pay | Admitting: Family Medicine

## 2019-12-23 DIAGNOSIS — J3089 Other allergic rhinitis: Secondary | ICD-10-CM

## 2019-12-23 MED ORDER — AZELASTINE HCL 0.1 % NA SOLN: 1.0000 | Freq: Two times a day (BID) | NASAL | 5 refills | Status: DC

## 2019-12-23 NOTE — Telephone Encounter (Signed)
Sent rx  [name], DO [facility] 12/23/2019, 7:10 PM

## 2019-12-23 NOTE — Telephone Encounter (Addendum)
Pt saw dr Raliegh Ip on 12/19/2019. Pt went home and discuss with his wife the medication dr Raliegh Ip would like for him to try. Pt would like new rx astelin NS send to [place] [address]

## 2019-12-24 DIAGNOSIS — L97421 Non-pressure chronic ulcer of left heel and midfoot limited to breakdown of skin: Secondary | ICD-10-CM | POA: Diagnosis not present

## 2019-12-24 DIAGNOSIS — I739 Peripheral vascular disease, unspecified: Secondary | ICD-10-CM | POA: Diagnosis not present

## 2019-12-25 DIAGNOSIS — G250 Essential tremor: Secondary | ICD-10-CM | POA: Diagnosis not present

## 2019-12-25 DIAGNOSIS — I2510 Atherosclerotic heart disease of native coronary artery without angina pectoris: Secondary | ICD-10-CM | POA: Diagnosis not present

## 2019-12-25 DIAGNOSIS — E782 Mixed hyperlipidemia: Secondary | ICD-10-CM | POA: Diagnosis not present

## 2019-12-25 DIAGNOSIS — I10 Essential (primary) hypertension: Secondary | ICD-10-CM | POA: Diagnosis not present

## 2019-12-25 DIAGNOSIS — I5022 Chronic systolic (congestive) heart failure: Secondary | ICD-10-CM | POA: Diagnosis not present

## 2020-01-03 DIAGNOSIS — H6123 Impacted cerumen, bilateral: Secondary | ICD-10-CM | POA: Diagnosis not present

## 2020-01-14 ENCOUNTER — Telehealth: Payer: Self-pay

## 2020-01-14 NOTE — Telephone Encounter (Signed)
Faxed and also left up front just in case if shows up in person.

## 2020-01-14 NOTE — Telephone Encounter (Signed)
Handwritte order ready for fax to [facility]. In my outbox, will be available to fax tomorrow 01/15/20 or can pick it up sooner  Dx bilateral foot drop, gait abnormal, difficulty walking.  To fax to [facility] location [place], [facility] 01/14/2020, 12:20 PM

## 2020-01-14 NOTE — Telephone Encounter (Signed)
Copied from [place] [id number]. Topic: Referral - Request for Referral >> Jan 13, 2020  4:26 PM Rainey Pines A wrote: Has patient seen PCP for this complaint? yes *If NO, is insurance requiring patient see PCP for this issue before PCP can refer them? Referral for which specialty: Physical Therapy Preferred provider/office: [facility] [phone] in [place] Reason for referral: Difficulty Walking

## 2020-01-14 NOTE — Telephone Encounter (Signed)
Left detail message. 

## 2020-01-20 DIAGNOSIS — L97421 Non-pressure chronic ulcer of left heel and midfoot limited to breakdown of skin: Secondary | ICD-10-CM | POA: Diagnosis not present

## 2020-01-20 DIAGNOSIS — L235 Allergic contact dermatitis due to other chemical products: Secondary | ICD-10-CM | POA: Diagnosis not present

## 2020-01-20 DIAGNOSIS — Z89412 Acquired absence of left great toe: Secondary | ICD-10-CM | POA: Diagnosis not present

## 2020-01-20 DIAGNOSIS — I739 Peripheral vascular disease, unspecified: Secondary | ICD-10-CM | POA: Diagnosis not present

## 2020-01-21 DIAGNOSIS — D0462 Carcinoma in situ of skin of left upper limb, including shoulder: Secondary | ICD-10-CM | POA: Diagnosis not present

## 2020-01-21 DIAGNOSIS — L97821 Non-pressure chronic ulcer of other part of left lower leg limited to breakdown of skin: Secondary | ICD-10-CM | POA: Diagnosis not present

## 2020-01-21 DIAGNOSIS — I872 Venous insufficiency (chronic) (peripheral): Secondary | ICD-10-CM | POA: Diagnosis not present

## 2020-01-21 DIAGNOSIS — D485 Neoplasm of uncertain behavior of skin: Secondary | ICD-10-CM | POA: Diagnosis not present

## 2020-01-21 DIAGNOSIS — C44629 Squamous cell carcinoma of skin of left upper limb, including shoulder: Secondary | ICD-10-CM | POA: Diagnosis not present

## 2020-01-22 ENCOUNTER — Telehealth: Payer: Self-pay

## 2020-01-22 DIAGNOSIS — L97429 Non-pressure chronic ulcer of left heel and midfoot with unspecified severity: Secondary | ICD-10-CM

## 2020-01-22 NOTE — Telephone Encounter (Signed)
Copied from [place] [id number]. Topic: General - Inquiry >> Jan 22, 2020  2:47 PM [name] wrote: Reason for CRM: Pt is wanting a referral for wound care . He is asking that Dr Raliegh Ip or his nurse please give him a call .

## 2020-01-22 NOTE — Telephone Encounter (Signed)
As per podiatrist patient has ulcer on his Left heel bottom which is not infected but not healing from 10/2019 and podiatrist suggested wound care referral.

## 2020-01-22 NOTE — Telephone Encounter (Signed)
Referral to [place], [facility] 01/22/2020, 5:13 PM

## 2020-01-28 DIAGNOSIS — H35033 Hypertensive retinopathy, bilateral: Secondary | ICD-10-CM | POA: Diagnosis not present

## 2020-01-29 DIAGNOSIS — M21372 Foot drop, left foot: Secondary | ICD-10-CM | POA: Diagnosis not present

## 2020-01-29 DIAGNOSIS — M21371 Foot drop, right foot: Secondary | ICD-10-CM | POA: Diagnosis not present

## 2020-01-29 DIAGNOSIS — Z9181 History of falling: Secondary | ICD-10-CM | POA: Diagnosis not present

## 2020-01-29 DIAGNOSIS — R262 Difficulty in walking, not elsewhere classified: Secondary | ICD-10-CM | POA: Diagnosis not present

## 2020-01-29 DIAGNOSIS — R2681 Unsteadiness on feet: Secondary | ICD-10-CM | POA: Diagnosis not present

## 2020-02-03 DIAGNOSIS — Z9181 History of falling: Secondary | ICD-10-CM | POA: Diagnosis not present

## 2020-02-03 DIAGNOSIS — R262 Difficulty in walking, not elsewhere classified: Secondary | ICD-10-CM | POA: Diagnosis not present

## 2020-02-03 DIAGNOSIS — R2681 Unsteadiness on feet: Secondary | ICD-10-CM | POA: Diagnosis not present

## 2020-02-03 DIAGNOSIS — M21372 Foot drop, left foot: Secondary | ICD-10-CM | POA: Diagnosis not present

## 2020-02-03 DIAGNOSIS — M21371 Foot drop, right foot: Secondary | ICD-10-CM | POA: Diagnosis not present

## 2020-02-04 ENCOUNTER — Ambulatory Visit: Payer: Self-pay | Admitting: Family Medicine

## 2020-02-04 DIAGNOSIS — L235 Allergic contact dermatitis due to other chemical products: Secondary | ICD-10-CM | POA: Diagnosis not present

## 2020-02-04 DIAGNOSIS — L97421 Non-pressure chronic ulcer of left heel and midfoot limited to breakdown of skin: Secondary | ICD-10-CM | POA: Diagnosis not present

## 2020-02-04 DIAGNOSIS — I8312 Varicose veins of left lower extremity with inflammation: Secondary | ICD-10-CM | POA: Diagnosis not present

## 2020-02-04 DIAGNOSIS — I739 Peripheral vascular disease, unspecified: Secondary | ICD-10-CM | POA: Diagnosis not present

## 2020-02-04 DIAGNOSIS — I824Z2 Acute embolism and thrombosis of unspecified deep veins of left distal lower extremity: Secondary | ICD-10-CM | POA: Diagnosis not present

## 2020-02-04 DIAGNOSIS — L03116 Cellulitis of left lower limb: Secondary | ICD-10-CM | POA: Diagnosis not present

## 2020-02-04 DIAGNOSIS — Z89412 Acquired absence of left great toe: Secondary | ICD-10-CM | POA: Diagnosis not present

## 2020-02-04 DIAGNOSIS — L089 Local infection of the skin and subcutaneous tissue, unspecified: Secondary | ICD-10-CM | POA: Diagnosis not present

## 2020-02-04 NOTE — Telephone Encounter (Signed)
Pt reports left leg swelling, onset Friday. States swelling from ankle to knee, worsening. Also reports area red, warm, tender to touch. States wears AFOs and area "Buldging out." States saw podiatrist  this AM who recommended he call PCP as "Could be a DVT."  Denies any SOB, no CP. Swelling left leg only. Advised ED, pt asking if Dr  PCP could just order test somewhere. Reiterated need for ED eval now, "Hope to avoid ED." Call placed to practice Apolonio Schneiders and As[facility] Lente. states spoke with Dr. Parks Ranger who agrees with disposition of ED.   Pt aware, states will go to ED.  ,  Reason for Disposition . [1] Thigh or calf pain AND [2] only 1 side AND [3] present > 1 hour  Answer Assessment - Initial Assessment Questions 1. ONSET: "When did the swelling start?" (e.g., minutes, hours, days)     Friday 2. LOCATION: "What part of the leg is swollen?"  "Are both legs swollen or just one leg?"     Left leg from ankle to knee 3. SEVERITY: "How bad is the swelling?" (e.g., localized; mild, moderate, severe)  - Localized - small area of swelling localized to one leg  - MILD pedal edema - swelling limited to foot and ankle, pitting edema < 1/4 inch (6 mm) deep, rest and elevation eliminate most or all swelling  - MODERATE edema - swelling of lower leg to knee, pitting edema > 1/4 inch (6 mm) deep, rest and elevation only partially reduce swelling  - SEVERE edema - swelling extends above knee, facial or hand swelling present     Moderate 4. REDNESS: "Does the swelling look red or infected?"     Red, tender to touch and hot to touch 5. PAIN: "Is the swelling painful to touch?" If Yes, ask: "How painful is it?"   (Scale 1-10; mild, moderate or severe)     AFOs on both legs, left swollen, pushing out of AFOs 6. FEVER: "Do you have a fever?" If Yes, ask: "What is it, how was it measured, and when did it start?"     no 7. CAUSE: "What do you think is causing the leg swelling?"     Maybe clot 8. MEDICAL HISTORY: "Do  you have a history of heart failure, kidney disease, liver failure, or cancer?"     *No Answer* 9. RECURRENT SYMPTOM: "Have you had leg swelling before?" If Yes, ask: "When was the last time?" "What happened that time?"     *No Answer* 10. OTHER SYMPTOMS: "Do you have any other symptoms?" (e.g., chest pain, difficulty breathing)      no  Protocols used: LEG SWELLING AND EDEMA-A-AH

## 2020-02-04 NOTE — Telephone Encounter (Signed)
I spoke with Velta Addison, RN from [facility]. She is calling to find out what is DR k recommendation for this patient with a possible DVT in his left leg. The pt been complaining of left leg swelling from ankle to knee with redness and hot to the touch x 4 days. He saw his podiatry today and they was concern that he could possible have a DVT. He state that his podiatry recommended that he contact his PCP to get a Ultrasound. Dr. Raliegh Ip recommended that the patient be seen and evaluated at the hospital. They can get him in and due the necessary scans that are needed. Velta Addison was notified and verbalize understanding. She said she will relay the message to the patient.

## 2020-02-05 ENCOUNTER — Other Ambulatory Visit: Payer: Self-pay

## 2020-02-05 ENCOUNTER — Encounter: Payer: Self-pay | Admitting: Emergency Medicine

## 2020-02-05 ENCOUNTER — Ambulatory Visit (INDEPENDENT_AMBULATORY_CARE_PROVIDER_SITE_OTHER)
Admit: 2020-02-05 | Discharge: 2020-02-05 | Disposition: A | Discharge status: Home / Self Care | Payer: Medicare Other | Attending: Emergency Medicine | Admitting: Emergency Medicine

## 2020-02-05 ENCOUNTER — Ambulatory Visit
Admission: EM | Admit: 2020-02-05 | Discharge: 2020-02-05 | Disposition: A | Discharge status: Home / Self Care | Payer: Medicare Other | Attending: Emergency Medicine | Admitting: Emergency Medicine

## 2020-02-05 DIAGNOSIS — L03116 Cellulitis of left lower limb: Secondary | ICD-10-CM

## 2020-02-05 DIAGNOSIS — Z8546 Personal history of malignant neoplasm of prostate: Secondary | ICD-10-CM | POA: Diagnosis not present

## 2020-02-05 DIAGNOSIS — E871 Hypo-osmolality and hyponatremia: Secondary | ICD-10-CM

## 2020-02-05 DIAGNOSIS — R590 Localized enlarged lymph nodes: Secondary | ICD-10-CM | POA: Diagnosis not present

## 2020-02-05 DIAGNOSIS — R600 Localized edema: Secondary | ICD-10-CM | POA: Diagnosis not present

## 2020-02-05 LAB — CBC WITH DIFFERENTIAL/PLATELET
Abs Immature Granulocytes: 0.03 10*3/uL (ref 0.00–0.07)
Basophils Absolute: 0 10*3/uL (ref 0.0–0.1)
Basophils Relative: 1 %
Eosinophils Absolute: 0 10*3/uL (ref 0.0–0.5)
Eosinophils Relative: 1 %
HCT: 32.5 % — ABNORMAL LOW (ref 39.0–52.0)
Hemoglobin: 11.7 g/dL — ABNORMAL LOW (ref 13.0–17.0)
Immature Granulocytes: 1 %
Lymphocytes Relative: 15 %
Lymphs Abs: 1 10*3/uL (ref 0.7–4.0)
MCH: 37.6 pg — ABNORMAL HIGH (ref 26.0–34.0)
MCHC: 36 g/dL (ref 30.0–36.0)
MCV: 104.5 fL — ABNORMAL HIGH (ref 80.0–100.0)
Monocytes Absolute: 0.5 10*3/uL (ref 0.1–1.0)
Monocytes Relative: 8 %
Neutro Abs: 5 10*3/uL (ref 1.7–7.7)
Neutrophils Relative %: 74 %
Platelets: 280 10*3/uL (ref 150–400)
RBC: 3.11 MIL/uL — ABNORMAL LOW (ref 4.22–5.81)
RDW: 13.2 % (ref 11.5–15.5)
WBC: 6.6 10*3/uL (ref 4.0–10.5)
nRBC: 0 % (ref 0.0–0.2)

## 2020-02-05 LAB — BASIC METABOLIC PANEL
Anion gap: 13 (ref 5–15)
BUN: 11 mg/dL (ref 8–23)
CO2: 26 mmol/L (ref 22–32)
Calcium: 9.3 mg/dL (ref 8.9–10.3)
Chloride: 87 mmol/L — ABNORMAL LOW (ref 98–111)
Creatinine, Ser: 0.77 mg/dL (ref 0.61–1.24)
GFR calc Af Amer: >60 mL/min (ref >60)
GFR calc non Af Amer: >60 mL/min (ref >60)
Glucose, Bld: 112 mg/dL — ABNORMAL HIGH (ref 70–99)
Potassium: 3 mmol/L — ABNORMAL LOW (ref 3.5–5.1)
Sodium: 126 mmol/L — ABNORMAL LOW (ref 135–145)

## 2020-02-05 MED ORDER — DOXYCYCLINE HYCLATE 100 MG PO CAPS: 100.0000 mg | ORAL_CAPSULE | Freq: Two times a day (BID) | ORAL | 0 refills | Status: AC

## 2020-02-05 MED ORDER — CEFTRIAXONE SODIUM 1 G IJ SOLR
1.0000 g | Freq: Once | INTRAMUSCULAR | Status: AC
  Administered 2020-02-05: 1 g via INTRAMUSCULAR

## 2020-02-05 NOTE — ED Triage Notes (Signed)
Patient in today c/o left lower leg swelling x 5 days. Patient unable to determine pain due to neuropathy from the knees down. Patient does wear AFO braces on both legs.

## 2020-02-05 NOTE — Discharge Instructions (Addendum)
Your presentation is consistent with a skin infection.  I have given you a gram of Rocephin which is an antibiotic to kick start your therapy.  Continue the Keflex.  I have added doxycycline to your antibiotic regimen as well.  Follow-up with your doctor in 2 days to make sure that you are getting better and for recheck of your sodium.  Immediately to the ER for fevers above 100.4, malaise, body aches, if your leg continues to get worse, or for any other concerns.

## 2020-02-05 NOTE — ED Provider Notes (Signed)
HPI  SUBJECTIVE:  [name] is a 84 y.o. male who presents with 5 days of left lower extremity/calf swelling.  Denies pain, but has peripheral neuropathy.  He reports edema, increased temperature.  States that the calf is getting larger.  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  He has no complaints regarding this.  He is doing his usual wound care.  He denies swelling in his right lower extremity.  No chest pain, shortness of breath, hemoptysis, change in his medications.  No surgery in the past 4 weeks, recent immobilization, trauma. He was seen by his dermatologist yesterday, thought to have an infection, started on Keflex 500 mg 3 times daily.  He has had 1 dose so far.  He states that this seems to be helping.  No aggravating factors.  He has a past medical history of right unilateral calf swelling that resolved with diuretics.  He has a past medical history of chronic hyponatremia, anemia, hypertension, congestive heart failure, MRSA, extremity peripheral neuropathy.  No history of diabetes, DVT, PE, hypercoagulability.  No history of active cancer.  He wears braces in his bilateral lower extremities.  VVO:[id number], Devonne Doughty, DO  Past Medical History:  Diagnosis Date   Arthritis    Benign essential tremor    Fracture, femur ([place]) 2012   GERD (gastroesophageal reflux disease)    H/O echocardiogram    05/2013   Hx of colonoscopy    04/22/2013   Hypercholesterolemia    Hypertension    Myocardial infarction [facility])    1987   Polyneuropathy    Prostate cancer ([place]) 2004   Superficial hematoma     Past Surgical History:  Procedure Laterality Date   BASAL CELL CARCINOMA EXCISION     COLONOSCOPY  05/01/2009   Positive for colonic polyps   COLONOSCOPY WITH PROPOFOL N/A 02/24/2016   Procedure: COLONOSCOPY WITH PROPOFOL;  Surgeon: Christene Lye, MD;  Location: [facility] ENDOSCOPY;  Service: Endoscopy;   Laterality: N/A;   CORONARY ARTERY BYPASS GRAFT  1987   [place]   PROSTATE SURGERY  2004    Family History  Problem Relation Age of Onset   Heart disease Mother     Social History   Tobacco Use   Smoking status: Never Smoker   Smokeless tobacco: Never Used  Vaping Use   Vaping Use: Never used  Substance Use Topics   Alcohol use: Yes    Alcohol/week: 7.0 standard drinks    Types: 7 Glasses of wine per week    Comment: wine in the evening with dinner   Drug use: No    No current facility-administered medications for this encounter.  Current Outpatient Medications:    Alpha Lipoic Acid 200 MG CAPS, Take 2 capsules by mouth daily., Disp: , Rfl:    amLODipine (NORVASC) 2.5 MG tablet, TAKE 1 TABLET BY MOUTH  DAILY, Disp: 90 tablet, Rfl: 3   Apoaequorin (PREVAGEN PO), Take by mouth., Disp: , Rfl:    aspirin 81 MG tablet, Take 81 mg by mouth daily. , Disp: , Rfl:    azelastine (ASTELIN) 0.1 % nasal spray, Place 1 spray into both nostrils 2 (two) times daily. Use in each nostril as directed, Disp: 30 mL, Rfl: 5   B Complex Vitamins (VITAMIN-B COMPLEX PO), Take 1 tablet by mouth daily. Reported on 10/06/2015, Disp: , Rfl:    Biotin 1000 MCG tablet,  Take 1,000 mcg by mouth daily., Disp: , Rfl:    Calcium Carb-Cholecalciferol 500-100 MG-UNIT CHEW, Chew 500 mg by mouth daily., Disp: , Rfl:    cyanocobalamin 1000 MCG tablet, Take 1,000 mcg by mouth daily., Disp: , Rfl:    folic acid (FOLVITE) 1 MG tablet, Take 1 mg by mouth daily., Disp: , Rfl:    furosemide (LASIX) 20 MG tablet, TAKE 1 TABLET BY MOUTH  DAILY, Disp: 90 tablet, Rfl: 3   gabapentin (NEURONTIN) 600 MG tablet, TAKE 2 TABLETS BY MOUTH TWO TIMES DAILY, Disp: 360 tablet, Rfl: 3   Glucosamine Sulfate 1000 MG CAPS, Take 1 capsule by mouth daily., Disp: , Rfl:    hydrochlorothiazide (HYDRODIURIL) 25 MG tablet, Take 1 tablet (25 mg total) by mouth 2 (two) times daily., Disp: 180 tablet, Rfl:  3   Lecithin 400 MG CAPS, Take 400 mg by mouth daily., Disp: , Rfl:    losartan (COZAAR) 50 MG tablet, Take 50 mg by mouth daily., Disp: , Rfl:    Melatonin 3 MG CAPS, Take 3 mg by mouth at bedtime., Disp: , Rfl:    meloxicam (MOBIC) 15 MG tablet, Take 1 tablet (15 mg total) by mouth daily as needed for pain. For up to 1-2 weeks, then reduce to less frequent for longer if need, Disp: 30 tablet, Rfl: 1   Omega 3 1000 MG CAPS, Take 1,000 mg by mouth daily., Disp: , Rfl:    omeprazole (PRILOSEC) 20 MG capsule, TAKE 1 CAPSULE BY MOUTH TWO TIMES DAILY BEFORE MEALS, Disp: 180 capsule, Rfl: 3   ondansetron (ZOFRAN-ODT) 8 MG disintegrating tablet, Take 1 tablet (8 mg total) by mouth daily as needed for nausea or vomiting., Disp: 90 tablet, Rfl: 1   potassium chloride SA (KLOR-CON) 20 MEQ tablet, Take 1 tablet (20 mEq total) by mouth 2 (two) times daily., Disp: 180 tablet, Rfl: 3   pravastatin (PRAVACHOL) 80 MG tablet, TAKE 1 TABLET BY MOUTH  DAILY, Disp: 90 tablet, Rfl: 3   primidone (MYSOLINE) 50 MG tablet, TAKE 1 TABLET BY MOUTH 3  TIMES DAILY, Disp: 270 tablet, Rfl: 3   Selenium 200 MCG CAPS, Take 200 mcg by mouth daily., Disp: , Rfl:    tretinoin (RETIN-A) 0.05 % cream, Apply 1 application topically at bedtime., Disp: , Rfl:    doxycycline (VIBRAMYCIN) 100 MG capsule, Take 1 capsule (100 mg total) by mouth 2 (two) times daily for 7 days., Disp: 14 capsule, Rfl: 0  Allergies  Allergen Reactions   Codeine Nausea And Vomiting   Indocin [Indomethacin] Other (See Comments)   Latex Other (See Comments)    Blisters   Mupirocin     Blisters   Plavix [Clopidogrel Bisulfate]     GI intolerance, n/v   Polysporin [Bacitracin-Polymyxin B]     Blisters   Shellfish Allergy    Tape    Neosporin [Neomycin-Bacitracin Zn-Polymyx] Rash   Other Nausea And Vomiting    mussels     ROS  As noted in HPI.   Physical Exam  BP 114/79 (BP Location: Left Arm)    Pulse 94    Temp 98.1 F  (36.7 C) (Oral)    Resp 18    Ht 6' (1.829 m)    Wt 93 kg    SpO2 97%    BMI 27.80 kg/m   Constitutional: Well developed, well nourished, no acute distress Eyes:  EOMI, conjunctiva normal bilaterally HENT: Normocephalic, atraumatic,mucus membranes moist Respiratory: Normal inspiratory effort Cardiovascular: Normal rate  GI: nondistended skin: No rash, skin intact Musculoskeletal: Nontender 2-3+ pitting edema left lower extremity.  Increased temperature.  No palpable cord.  Right calf 41.5 cm, left calf 44.5 cm.  DP 2+.  No tenderness over the entire lower leg or foot.  Positive healing chronic appearing ulcer on the calcaneus.  No expressible purulent drainage.        Neurologic: Alert & oriented x 3, no focal neuro deficits Psychiatric: Speech and behavior appropriate   ED Course   Medications  cefTRIAXone (ROCEPHIN) injection 1 g (1 g Intramuscular Given 02/05/20 1243)    Orders Placed This Encounter  Procedures   US Venous Img Lower Unilateral Left    Standing Status:   Standing    Number of Occurrences:   1    Order Specific Question:   Reason for Exam (SYMPTOM  OR DIAGNOSIS REQUIRED)    Answer:   r/o DVT   CBC with Differential    Standing Status:   Standing    Number of Occurrences:   1   Basic metabolic panel    Standing Status:   Standing    Number of Occurrences:   1    Results for orders placed or performed during the hospital encounter of 02/05/20 (from the past 24 hour(s))  CBC with Differential     Status: Abnormal   Collection Time: 02/05/20 12:39 PM  Result Value Ref Range   WBC 6.6 4.0 - 10.5 K/uL   RBC 3.11 (L) 4.22 - 5.81 MIL/uL   Hemoglobin 11.7 (L) 13.0 - 17.0 g/dL   HCT 32.5 (L) 39 - 52 %   MCV 104.5 (H) 80.0 - 100.0 fL   MCH 37.6 (H) 26.0 - 34.0 pg   MCHC 36.0 30.0 - 36.0 g/dL   RDW 13.2 11.5 - 15.5 %   Platelets 280 150 - 400 K/uL   nRBC 0.0 0.0 - 0.2 %   Neutrophils Relative % 74 %   Neutro Abs 5.0 1.7 - 7.7 K/uL   Lymphocytes  Relative 15 %   Lymphs Abs 1.0 0.7 - 4.0 K/uL   Monocytes Relative 8 %   Monocytes Absolute 0.5 0 - 1 K/uL   Eosinophils Relative 1 %   Eosinophils Absolute 0.0 0 - 0 K/uL   Basophils Relative 1 %   Basophils Absolute 0.0 0 - 0 K/uL   Immature Granulocytes 1 %   Abs Immature Granulocytes 0.03 0.00 - 0.07 K/uL  Basic metabolic panel     Status: Abnormal   Collection Time: 02/05/20 12:39 PM  Result Value Ref Range   Sodium 126 (L) 135 - 145 mmol/L   Potassium 3.0 (L) 3.5 - 5.1 mmol/L   Chloride 87 (L) 98 - 111 mmol/L   CO2 26 22 - 32 mmol/L   Glucose, Bld 112 (H) 70 - 99 mg/dL   BUN 11 8 - 23 mg/dL   Creatinine, Ser 0.77 0.61 - 1.24 mg/dL   Calcium 9.3 8.9 - 10.3 mg/dL   GFR calc non Af Amer >60 >60 mL/min   GFR calc Af Amer >60 >60 mL/min   Anion gap 13 5 - 15   US Venous Img Lower Unilateral Left  Result Date: 02/05/2020 CLINICAL DATA:  Left calf edema for the past 6 days. History of prostate cancer. Patient is currently on anticoagulation. Evaluate for DVT. EXAM: LEFT LOWER EXTREMITY VENOUS DOPPLER ULTRASOUND TECHNIQUE: Gray-scale sonography with graded compression, as well as color Doppler and duplex ultrasound were performed to evaluate  the lower extremity deep venous systems from the level of the common femoral vein and including the common femoral, femoral, profunda femoral, popliteal and calf veins including the posterior tibial, peroneal and gastrocnemius veins when visible. The superficial great saphenous vein was also interrogated. Spectral Doppler was utilized to evaluate flow at rest and with distal augmentation maneuvers in the common femoral, femoral and popliteal veins. COMPARISON:  None. FINDINGS: Contralateral Common Femoral Vein: Respiratory phasicity is normal and symmetric with the symptomatic side. No evidence of thrombus. Normal compressibility. Common Femoral Vein: No evidence of thrombus. Normal compressibility, respiratory phasicity and response to augmentation.  Saphenofemoral Junction: No evidence of thrombus. Normal compressibility and flow on color Doppler imaging. Profunda Femoral Vein: No evidence of thrombus. Normal compressibility and flow on color Doppler imaging. Femoral Vein: No evidence of thrombus. Normal compressibility, respiratory phasicity and response to augmentation. Popliteal Vein: No evidence of thrombus. Normal compressibility, respiratory phasicity and response to augmentation. Calf Veins: No evidence of thrombus. Normal compressibility and flow on color Doppler imaging. Superficial Great Saphenous Vein: No evidence of thrombus. Normal compressibility. Venous Reflux:  None. Other Findings: There is a minimal amount of subcutaneous edema at the level of the left lower leg (image 42). Note is made of a prominent though benign appearing left inguinal lymph node which is not enlarged by size criteria measuring 0.9 cm in greatest short axis diameter and maintains a benign fatty hilum, presumably reactive in etiology. IMPRESSION: No evidence of DVT within the left lower extremity. Electronically Signed   By: Sandi Mariscal M.D.   On: 02/05/2020 13:55    ED Clinical Impression  1. Cellulitis of leg, left   2. Chronic hyponatremia      ED Assessment/Plan  Cellulitis versus DVT versus venous stasis. Plan: cbc,bmp, DVT ultrasound.  Doubt osteomyelitis.  Deferring x-ray of the foot.  CBC shows anemia, appears to be at his baseline.  BMP also shows hyponatremia, consistent with previous values.  Kidney function is normal.  Ultrasound negative for DVT per radiology report.  Has a reactive lymph node in the left inguinal region.  Presentation consistent with cellulitis.  Will add doxycycline to his Keflex because he has a history of MRSA.  He was given a gram of Rocephin here.  Close follow-up with PMD to make sure that he is getting better and for the hyponatremia.  Discussed labs, imaging, MDM, treatment plan, and plan for follow-up with patient.  Discussed sn/sx that should prompt return to the ED. patient agrees with plan.   Meds ordered this encounter  Medications   cefTRIAXone (ROCEPHIN) injection 1 g   doxycycline (VIBRAMYCIN) 100 MG capsule    Sig: Take 1 capsule (100 mg total) by mouth 2 (two) times daily for 7 days.    Dispense:  14 capsule    Refill:  0    *This clinic note was created using [employer]. Therefore, there may be occasional mistakes despite careful proofreading.   ?    Melynda Ripple, MD 02/05/20 1424

## 2020-02-06 ENCOUNTER — Ambulatory Visit (INDEPENDENT_AMBULATORY_CARE_PROVIDER_SITE_OTHER): Payer: Medicare Other | Admitting: Family Medicine

## 2020-02-06 ENCOUNTER — Encounter: Payer: Self-pay | Admitting: Family Medicine

## 2020-02-06 VITALS — BP 114/65 | HR 84 | Temp 97.1°F | Resp 16 | Ht 72.0 in | Wt 198.0 lb

## 2020-02-06 DIAGNOSIS — L97429 Non-pressure chronic ulcer of left heel and midfoot with unspecified severity: Secondary | ICD-10-CM | POA: Diagnosis not present

## 2020-02-06 DIAGNOSIS — M7989 Other specified soft tissue disorders: Secondary | ICD-10-CM

## 2020-02-06 DIAGNOSIS — E871 Hypo-osmolality and hyponatremia: Secondary | ICD-10-CM

## 2020-02-06 DIAGNOSIS — L03116 Cellulitis of left lower limb: Secondary | ICD-10-CM

## 2020-02-06 NOTE — Patient Instructions (Addendum)
Thank you for coming to the office today.  Sodium was mildly low again. Also potassium was low.  Try to take in more salt and limit regular water intake.  We can check the blood again whenever you are ready in September or October. Orders are in.  Do NOT take the Doxycycline at this time. I would recommend KEEP taking the Keflex 500mg  3 times a day for full 10 days, should finish next week approximately around 02/14/20  As long as the [facility] is working well - should gradually reduce the redness and swelling - then you can finish that one and be done.  If the redness does not improve, or you get any PUSTULE or abscess drainage of pus, or fevers or other concerns - then I would recommend the Doxycycline.  ONLY IF YOU NEED TO TAKE IT recommend Doxycycline antibiotic 100mg  twice daily for 7 days - Take with full glass of water and stay upright for at least 30 min after taking, may be seated or standing, but should NOT lay down. This is just a safety precaution, if this medicine does not go all the way down throat well it could cause some burning discomfort to throat and esophagus.  This was ordered as a precaution due to history of MRSA only - hopefully we don't need it.  DUE for NON FASTING BLOOD WORK   SCHEDULE "Lab Only" visit in the morning at the clinic for lab draw in 3-4 WEEKS   - Make sure Lab Only appointment is at about 1 week before your next appointment, so that results will be available  For Lab Results, once available within 2-3 days of blood draw, you can can log in to MyChart online to view your results and a brief explanation. Also, we can discuss results at next follow-up visit.    Please schedule a Follow-up Appointment to: Return in about 4 weeks (around 03/05/2020) for 3-4 weeks follow-up Sodium.  If you have any other questions or concerns, please feel free to call the office or send a message through [facility]. You may also schedule an earlier appointment if  necessary.  Additionally, you may be receiving a survey about your experience at our office within a few days to 1 week by e-mail or mail. We value your feedback.  [name], DO [place]

## 2020-02-06 NOTE — Progress Notes (Signed)
Subjective:    Patient ID: [name], male    DOB: 1936/01/15, 84 y.o.   MRN: [id number]  [name] is a 84 y.o. male presenting on 02/06/2020 for Follow-up (urgent care--patient would like to discuss test result and also don't feel comfortable taking two diff Abx Rx ) and Cellulitis   HPI   ED FOLLOW-UP VISIT  Hospital/Location: [facility] Date of ED Visit: 02/05/20 UC  Reason for Presenting to ED: Left leg cellulitis / swelling rule out DVT Primary (+Secondary) Diagnosis: Left Leg Cellulitis  FOLLOW-UP  - ED provider note and record have been reviewed - Patient presents today about 1 day after recent Urgent Care visit. Brief summary of recent course, patient had symptoms of Left leg erythema and swelling with warmth, he had seen his Dermatologist day prior at [facility], he was given rx from Dr Grandville Silos (Derm) Keflex 500 TID (started 02/04/20 for 10 day course, last dose will be 02/14/20) he has tolerated it well. He had prior history of Cipro with diarrhea side effect.  He contacted our office and was triaged to go to directly to Hospital ED or Urgent Care for prompt evaluation. [facility] for left leg swelling redness. They did evaluation at that time. He had labs with BMET CBC, showed some similar abnormal results to his prior with low Na and K, and CBC showed normal 6.6, not significant anemia. He had [place] Left venous study done, see below, No acute DVT Identified. He was given Rocephin 1g IM x 1 dose. 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  He had evaluation of both lower extremity that did show some increased swelling on Left compared to R with some erythema.  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  - Today reports overall has done well after discharge from UC. Symptoms of left leg redness and swelling have improved on Keflex.  -  New medications on discharge: Doxycycyline 100 BID x 7 days, has NOT STARTED YET  I have reviewed the discharge medication list, and have reconciled the current and discharge medications today.   Depression screen [facility] 2/9 12/19/2019 03/20/2019 02/05/2019  Decreased Interest 0 0 0  Down, Depressed, Hopeless 0 0 0  PHQ - 2 Score 0 0 0    Social History   Tobacco Use  . Smoking status: Never Smoker  . Smokeless tobacco: Never Used  Vaping Use  . Vaping Use: Never used  Substance Use Topics  . Alcohol use: Yes    Alcohol/week: 7.0 standard drinks    Types: 7 Glasses of wine per week    Comment: wine in the evening with dinner  . Drug use: No    Review of Systems Per HPI unless specifically indicated above     Objective:    BP 114/65   Pulse 84   Temp (!) 97.1 F (36.2 C) (Temporal)   Resp 16   Ht 6' (1.829 m)   Wt 198 lb (89.8 kg)   SpO2 99%   BMI 26.85 kg/m   Wt Readings from Last 3 Encounters:  02/06/20 198 lb (89.8 kg)  02/05/20 205 lb (93 kg)  12/19/19 200 lb (90.7 kg)    Physical Exam Vitals and nursing note reviewed.  Constitutional:      General: He is not in acute distress.  Appearance: He is well-developed. He is not diaphoretic.     Comments: Well-appearing, comfortable, cooperative  HENT:     Head: Normocephalic and atraumatic.  Eyes:     General:        Right eye: No discharge.        Left eye: No discharge.     Conjunctiva/sclera: Conjunctivae normal.  Cardiovascular:     Rate and Rhythm: Normal rate.  Pulmonary:     Effort: Pulmonary effort is normal.  Musculoskeletal:     Left lower leg: Edema (improving edema) present.  Skin:    General: Skin is warm and dry.     Findings: Erythema (left lower extremity with improved, residing erythema) present. No rash.  Neurological:     Mental Status: He is alert and oriented to person, place, and time.  Psychiatric:        Behavior: Behavior normal.     Comments: Well groomed, good eye contact,  normal speech and thoughts      I have personally reviewed the radiology report from 02/05/20 on Left Venous Doppler.  US Venous Img Lower Unilateral LeftPerformed 02/05/2020 Final result  Study Result CLINICAL DATA: Left calf edema for the past 6 days. History of prostate cancer. Patient is currently on anticoagulation. Evaluate for DVT.  EXAM: LEFT LOWER EXTREMITY VENOUS DOPPLER ULTRASOUND  TECHNIQUE: Gray-scale sonography with graded compression, as well as color Doppler and duplex ultrasound were performed to evaluate the lower extremity deep venous systems from the level of the common femoral vein and including the common femoral, femoral, profunda femoral, popliteal and calf veins including the posterior tibial, peroneal and gastrocnemius veins when visible. The superficial great saphenous vein was also interrogated. Spectral Doppler was utilized to evaluate flow at rest and with distal augmentation maneuvers in the common femoral, femoral and popliteal veins.  COMPARISON: None.  FINDINGS: Contralateral Common Femoral Vein: Respiratory phasicity is normal and symmetric with the symptomatic side. No evidence of thrombus. Normal compressibility.  Common Femoral Vein: No evidence of thrombus. Normal compressibility, respiratory phasicity and response to augmentation.  Saphenofemoral Junction: No evidence of thrombus. Normal compressibility and flow on color Doppler imaging.  Profunda Femoral Vein: No evidence of thrombus. Normal compressibility and flow on color Doppler imaging.  Femoral Vein: No evidence of thrombus. Normal compressibility, respiratory phasicity and response to augmentation.  Popliteal Vein: No evidence of thrombus. Normal compressibility, respiratory phasicity and response to augmentation.  Calf Veins: No evidence of thrombus. Normal compressibility and flow on color Doppler imaging.  Superficial Great Saphenous Vein: No evidence of thrombus.  Normal compressibility.  Venous Reflux: None.  Other Findings: There is a minimal amount of subcutaneous edema at the level of the left lower leg (image 42). Note is made of a prominent though benign appearing left inguinal lymph node which is not enlarged by size criteria measuring 0.9 cm in greatest short axis diameter and maintains a benign fatty hilum, presumably reactive in etiology.  IMPRESSION: No evidence of DVT within the left lower extremity.   Electronically Signed By: Sandi Mariscal M.D. On: 02/05/2020 13:55   Results for orders placed or performed during the hospital encounter of 02/05/20  CBC with Differential  Result Value Ref Range   WBC 6.6 4.0 - 10.5 K/uL   RBC 3.11 (L) 4.22 - 5.81 MIL/uL   Hemoglobin 11.7 (L) 13.0 - 17.0 g/dL   HCT 32.5 (L) 39 - 52 %   MCV 104.5 (H) 80.0 - 100.0 fL   MCH 37.6 (  H) 26.0 - 34.0 pg   MCHC 36.0 30.0 - 36.0 g/dL   RDW 13.2 11.5 - 15.5 %   Platelets 280 150 - 400 K/uL   nRBC 0.0 0.0 - 0.2 %   Neutrophils Relative % 74 %   Neutro Abs 5.0 1.7 - 7.7 K/uL   Lymphocytes Relative 15 %   Lymphs Abs 1.0 0.7 - 4.0 K/uL   Monocytes Relative 8 %   Monocytes Absolute 0.5 0 - 1 K/uL   Eosinophils Relative 1 %   Eosinophils Absolute 0.0 0 - 0 K/uL   Basophils Relative 1 %   Basophils Absolute 0.0 0 - 0 K/uL   Immature Granulocytes 1 %   Abs Immature Granulocytes 0.03 0.00 - 0.07 K/uL  Basic metabolic panel  Result Value Ref Range   Sodium 126 (L) 135 - 145 mmol/L   Potassium 3.0 (L) 3.5 - 5.1 mmol/L   Chloride 87 (L) 98 - 111 mmol/L   CO2 26 22 - 32 mmol/L   Glucose, Bld 112 (H) 70 - 99 mg/dL   BUN 11 8 - 23 mg/dL   Creatinine, Ser 0.77 0.61 - 1.24 mg/dL   Calcium 9.3 8.9 - 10.3 mg/dL   GFR calc non Af Amer >60 >60 mL/min   GFR calc Af Amer >60 >60 mL/min   Anion gap 13 5 - 15      Assessment & Plan:   Problem List Items Addressed This Visit    Chronic hyponatremia    Other Visit Diagnoses    Cellulitis of left leg    -   Primary   Left leg swelling       Chronic heel ulcer, left, with unspecified severity (HCC)          #Left lower extremity cellulitis, edema Improving Recently seen at derm given Keflex Recent urgent care has checked Left LE ultrasound, negative for DVT, s/p IM Ceftriaxone and Doxycycline oral course Since he has not started Doxycycline yet, due to sensitivity to antibiotics - and already improving on cellulitis, I have advised him today to continue Keflex oral now for remaining approximately 7-8 days left, since already improved. He can HOLD doxycycline since he prefers to avoid this if possible, it was given as precaution for history of MRSA, exam is not supporting obvious abscess or purulence, less likely to be MRSA at this time. He will monitor it closely and follow-up sooner if needed and consult to determine if Doxycycline should be started   #Chronic Heel Ulcer, L Following with Wound Care, Podiatry No recent worsening at this time, has been debrided previously   No orders of the defined types were placed in this encounter.   Follow up plan: Return in about 4 weeks (around 03/05/2020) for 3-4 weeks follow-up Sodium.  Advised we may repeat the chemistry for following Sodium in near future.  [name], [facility] 02/06/2020, 11:40 AM

## 2020-02-06 NOTE — Assessment & Plan Note (Signed)
Recent reduced Sodium to 126, prior range 126-133 approx Remains on HCTZ and lasix K supplement  Recommend inc sodium in diet and limit free water Repeat lab in 4 weeks approx

## 2020-02-07 DIAGNOSIS — Z9181 History of falling: Secondary | ICD-10-CM | POA: Diagnosis not present

## 2020-02-07 DIAGNOSIS — R2681 Unsteadiness on feet: Secondary | ICD-10-CM | POA: Diagnosis not present

## 2020-02-07 DIAGNOSIS — M21372 Foot drop, left foot: Secondary | ICD-10-CM | POA: Diagnosis not present

## 2020-02-07 DIAGNOSIS — M21371 Foot drop, right foot: Secondary | ICD-10-CM | POA: Diagnosis not present

## 2020-02-07 DIAGNOSIS — R262 Difficulty in walking, not elsewhere classified: Secondary | ICD-10-CM | POA: Diagnosis not present

## 2020-02-11 ENCOUNTER — Other Ambulatory Visit: Payer: Self-pay

## 2020-02-11 ENCOUNTER — Encounter: Payer: Medicare Other | Attending: Physician Assistant | Admitting: Physician Assistant

## 2020-02-11 DIAGNOSIS — I10 Essential (primary) hypertension: Secondary | ICD-10-CM | POA: Insufficient documentation

## 2020-02-11 DIAGNOSIS — Z888 Allergy status to other drugs, medicaments and biological substances status: Secondary | ICD-10-CM | POA: Insufficient documentation

## 2020-02-11 DIAGNOSIS — M21371 Foot drop, right foot: Secondary | ICD-10-CM | POA: Diagnosis not present

## 2020-02-11 DIAGNOSIS — M21372 Foot drop, left foot: Secondary | ICD-10-CM | POA: Insufficient documentation

## 2020-02-11 DIAGNOSIS — Z91040 Latex allergy status: Secondary | ICD-10-CM | POA: Insufficient documentation

## 2020-02-11 DIAGNOSIS — Z91013 Allergy to seafood: Secondary | ICD-10-CM | POA: Insufficient documentation

## 2020-02-11 DIAGNOSIS — Z885 Allergy status to narcotic agent status: Secondary | ICD-10-CM | POA: Diagnosis not present

## 2020-02-11 DIAGNOSIS — I252 Old myocardial infarction: Secondary | ICD-10-CM | POA: Diagnosis not present

## 2020-02-11 DIAGNOSIS — Z8546 Personal history of malignant neoplasm of prostate: Secondary | ICD-10-CM | POA: Insufficient documentation

## 2020-02-11 DIAGNOSIS — G603 Idiopathic progressive neuropathy: Secondary | ICD-10-CM | POA: Diagnosis not present

## 2020-02-11 DIAGNOSIS — M1990 Unspecified osteoarthritis, unspecified site: Secondary | ICD-10-CM | POA: Insufficient documentation

## 2020-02-11 DIAGNOSIS — L89623 Pressure ulcer of left heel, stage 3: Secondary | ICD-10-CM | POA: Insufficient documentation

## 2020-02-20 ENCOUNTER — Ambulatory Visit: Payer: Medicare Other | Admitting: Physician Assistant

## 2020-02-21 ENCOUNTER — Ambulatory Visit: Payer: Medicare Other | Admitting: Physician Assistant

## 2020-02-23 ENCOUNTER — Other Ambulatory Visit: Payer: Self-pay | Admitting: Family Medicine

## 2020-02-23 DIAGNOSIS — I10 Essential (primary) hypertension: Secondary | ICD-10-CM

## 2020-02-23 DIAGNOSIS — G609 Hereditary and idiopathic neuropathy, unspecified: Secondary | ICD-10-CM

## 2020-02-23 DIAGNOSIS — K219 Gastro-esophageal reflux disease without esophagitis: Secondary | ICD-10-CM

## 2020-02-23 DIAGNOSIS — G250 Essential tremor: Secondary | ICD-10-CM

## 2020-02-23 DIAGNOSIS — E782 Mixed hyperlipidemia: Secondary | ICD-10-CM

## 2020-02-23 NOTE — Telephone Encounter (Signed)
Requested medication (s) are due for refill today: Primidone  yes      Losartan   amt not specified  Requested medication (s) are on the active medication list:Yes, both meds  Last refill: Primidone  04/22/19  #270   3 refills        Losartan  02/05/20  Future visit scheduled  no  Notes to clinic:not delegated      Losartan   by historical provider  Requested Prescriptions  Pending Prescriptions Disp Refills   losartan (COZAAR) 50 MG tablet [Pharmacy Med Name: Losartan Potassium 50 MG Oral Tablet] 90 tablet 3    Sig: TAKE 1 TABLET BY MOUTH  DAILY      Cardiovascular:  Angiotensin Receptor Blockers Failed - 02/23/2020  9:04 PM      Failed - K in normal range and within 180 days    Potassium  Date Value Ref Range Status  02/05/2020 3.0 (L) 3.5 - 5.1 mmol/L Final          Passed - Cr in normal range and within 180 days    Creat  Date Value Ref Range Status  12/12/2019 0.80 0.70 - 1.11 mg/dL Final    Comment:    For patients >70 years of age, the reference limit for Creatinine is approximately 13% higher for people identified as African-American. .    Creatinine, Ser  Date Value Ref Range Status  02/05/2020 0.77 0.61 - 1.24 mg/dL Final          Passed - Patient is not pregnant      Passed - Last BP in normal range    BP Readings from Last 1 Encounters:  02/06/20 114/65          Passed - Valid encounter within last 6 months    Recent Outpatient Visits           2 weeks ago Cellulitis of left leg   [place], DO   2 months ago Primary osteoarthritis involving multiple joints   [facility] [place], Devonne Doughty, DO   4 months ago [place], DO   4 months ago [place], DO   5 months ago Abrasion of right knee, initial encounter   [place], DO                 primidone (MYSOLINE) 50 MG tablet [Pharmacy Med Name: PRIMIDONE  50MG   TAB] 270 tablet 3    Sig: TAKE 1 TABLET BY MOUTH 3  TIMES DAILY      Not Delegated - Neurology:  Anticonvulsants Failed - 02/23/2020  9:04 PM      Failed - This refill cannot be delegated      Failed - HCT in normal range and within 360 days    HCT  Date Value Ref Range Status  02/05/2020 32.5 (L) 39 - 52 % Final   Hematocrit  Date Value Ref Range Status  09/29/2015 37.9 37.5 - 51.0 % Final          Failed - HGB in normal range and within 360 days    Hemoglobin  Date Value Ref Range Status  02/05/2020 11.7 (L) 13.0 - 17.0 g/dL Final  09/29/2015 12.7 12.6 - 17.7 g/dL Final          Passed - PLT in normal range and within  360 days    Platelets  Date Value Ref Range Status  02/05/2020 280 150 - 400 K/uL Final  09/29/2015 263 150 - 379 x10E3/uL Final          Passed - WBC in normal range and within 360 days    WBC  Date Value Ref Range Status  02/05/2020 6.6 4.0 - 10.5 K/uL Final          Passed - Valid encounter within last 12 months    Recent Outpatient Visits           2 weeks ago Cellulitis of left leg   [facility] [place], Devonne Doughty, DO   2 months ago Primary osteoarthritis involving multiple joints   [facility] [place], Devonne Doughty, DO   4 months ago [facility] [place], Devonne Doughty, DO   4 months ago [place], DO   5 months ago Abrasion of right knee, initial encounter   [place], DO               Signed Prescriptions Disp Refills   pravastatin (PRAVACHOL) 80 MG tablet 90 tablet 3    Sig: TAKE 1 TABLET BY MOUTH  DAILY      Cardiovascular:  Antilipid - Statins Passed - 02/23/2020  9:04 PM      Passed - Total Cholesterol in normal range and within 360 days    Cholesterol, Total  Date Value  Ref Range Status  09/29/2015 182 100 - 199 mg/dL Final   Cholesterol  Date Value Ref Range Status  12/12/2019 157 <200 mg/dL Final          Passed - LDL in normal range and within 360 days    LDL Cholesterol (Calc)  Date Value Ref Range Status  12/12/2019 46 mg/dL (calc) Final    Comment:    Reference range: <100 . Desirable range <100 mg/dL for primary prevention;   <70 mg/dL for patients with CHD or diabetic patients  with > or = 2 CHD risk factors. Marland Kitchen LDL-C is now calculated using the Martin-Hopkins  calculation, which is a validated novel method providing  better accuracy than the Friedewald equation in the  estimation of LDL-C.  Cresenciano Genre et al. Annamaria Helling. 8295;621(30): 2061-2068  (http://education.[facility].com/faq/FAQ164)           Passed - HDL in normal range and within 360 days    HDL  Date Value Ref Range Status  12/12/2019 94 > OR = 40 mg/dL Final  09/29/2015 75 >39 mg/dL Final          Passed - Triglycerides in normal range and within 360 days    Triglycerides  Date Value Ref Range Status  12/12/2019 89 <150 mg/dL Final          Passed - Patient is not pregnant      Passed - Valid encounter within last 12 months    Recent Outpatient Visits           2 weeks ago Cellulitis of left leg   [place], DO   2 months ago Primary osteoarthritis involving multiple joints   [facility] [place], Devonne Doughty, DO   4 months ago [place], DO   4 months ago [facility] [place], [place] Co[place]  J, DO   5 months ago Abrasion of right knee, initial encounter   [facility] [place], Devonne Doughty, DO                furosemide (LASIX) 20 MG tablet 90 tablet 3    Sig: TAKE 1 TABLET BY MOUTH  DAILY      Cardiovascular:  Diuretics - Loop Failed - 02/23/2020  9:04 PM      Failed - K in normal  range and within 360 days    Potassium  Date Value Ref Range Status  02/05/2020 3.0 (L) 3.5 - 5.1 mmol/L Final          Failed - Na in normal range and within 360 days    Sodium  Date Value Ref Range Status  02/05/2020 126 (L) 135 - 145 mmol/L Final  11/06/2018 133 (A) 137 - 147 Final          Passed - Ca in normal range and within 360 days    Calcium  Date Value Ref Range Status  02/05/2020 9.3 8.9 - 10.3 mg/dL Final          Passed - Cr in normal range and within 360 days    Creat  Date Value Ref Range Status  12/12/2019 0.80 0.70 - 1.11 mg/dL Final    Comment:    For patients >62 years of age, the reference limit for Creatinine is approximately 13% higher for people identified as African-American. .    Creatinine, Ser  Date Value Ref Range Status  02/05/2020 0.77 0.61 - 1.24 mg/dL Final          Passed - Last BP in normal range    BP Readings from Last 1 Encounters:  02/06/20 114/65          Passed - Valid encounter within last 6 months    Recent Outpatient Visits           2 weeks ago Cellulitis of left leg   [place], DO   2 months ago Primary osteoarthritis involving multiple joints   [place], DO   4 months ago [place], DO   4 months ago [place], DO   5 months ago Abrasion of right knee, initial encounter   [facility], Devonne Doughty, DO                omeprazole (PRILOSEC) 20 MG capsule 180 capsule 3    Sig: TAKE 1 CAPSULE BY MOUTH  TWICE DAILY BEFORE MEALS      Gastroenterology: Proton Pump Inhibitors Passed - 02/23/2020  9:04 PM      Passed - Valid encounter within last 12 months    Recent Outpatient Visits           2 weeks ago Cellulitis of left leg   [place],  DO   2 months ago Primary osteoarthritis involving multiple joints   [place], DO   4 months ago [place], DO   4 months ago [place], DO   5 months ago Abrasion of right knee, initial encounter   [place], Devonne Doughty, DO  gabapentin (NEURONTIN) 600 MG tablet 360 tablet 3    Sig: TAKE 2 TABLETS BY MOUTH  TWICE DAILY      Neurology: Anticonvulsants - gabapentin Passed - 02/23/2020  9:04 PM      Passed - Valid encounter within last 12 months    Recent Outpatient Visits           2 weeks ago Cellulitis of left leg   [place], DO   2 months ago Primary osteoarthritis involving multiple joints   [place], DO   4 months ago [place], DO   4 months ago [place], DO   5 months ago Abrasion of right knee, initial encounter   [place], DO                amLODipine (NORVASC) 2.5 MG tablet 90 tablet 3    Sig: TAKE 1 TABLET BY MOUTH  DAILY      Cardiovascular:  Calcium Channel Blockers Passed - 02/23/2020  9:04 PM      Passed - Last BP in normal range    BP Readings from Last 1 Encounters:  02/06/20 114/65          Passed - Valid encounter within last 6 months    Recent Outpatient Visits           2 weeks ago Cellulitis of left leg   [place], DO   2 months ago Primary osteoarthritis involving multiple joints   [place], DO   4 months ago [place], DO   4 months ago [place], DO   5 months ago Abrasion of right knee, initial encounter   [place], Devonne Doughty, DO

## 2020-02-25 DIAGNOSIS — D0472 Carcinoma in situ of skin of left lower limb, including hip: Secondary | ICD-10-CM | POA: Diagnosis not present

## 2020-02-25 NOTE — Progress Notes (Signed)
DIETER, HANE ([id number]) Visit Report for 02/11/2020 Chief Complaint Document Details Patient Name: [name], [name] Date of Service: 02/11/2020 12:45 PM Medical Record Number: [id number] Patient Account Number: [id number] Date of Birth/Sex: [birth year] (84 y.o. M) Treating RN: Cornell Barman Primary Care Provider: [name] Other Clinician: Referring Provider: [name] Treating Provider/Extender: Melburn Hake, Boni Maclellan Weeks in Treatment: 0 Information Obtained from: Patient Chief Complaint Pressure ulcer left heel Electronic Signature(s) Signed: 02/11/2020 1:30:05 PM By: Worthy Keeler PA-C Entered By: Worthy Keeler on 02/11/2020 13:30:04 [name], [name] ([id number]) -------------------------------------------------------------------------------- Debridement Details Patient Name: [name] Date of Service: 02/11/2020 12:45 PM Medical Record Number: [id number] Patient Account Number: [id number] Date of Birth/Sex: [birth year] (84 y.o. M) Treating RN: Grover Canavan Primary Care Provider: [name] Other Clinician: Referring Provider: [name] Treating Provider/Extender: Melburn Hake, Mauri Temkin Weeks in Treatment: 0 Debridement Performed for Wound #1 Left,Plantar Calcaneus Assessment: Performed By: Physician STONE III, Bekim Werntz E., PA-C Debridement Type: Debridement Level of Consciousness (Pre- Awake and Alert procedure): Pre-procedure Verification/Time Out Yes - 13:35 Taken: Start Time: 13:36 Pain Control: Lidocaine Total Area Debrided (L x W): 0.5 (cm) x 0.5 (cm) = 0.25 (cm) Tissue and other material Callus, Slough, Subcutaneous, Slough debrided: Level: Skin/Subcutaneous Tissue Debridement Description: Excisional Instrument: Curette Bleeding: Minimum Hemostasis Achieved: Pressure End Time: 13:40 Procedural Pain: 0 Post Procedural Pain: 0 Response to Treatment: Procedure was tolerated well Level of Consciousness (Post- Awake and  Alert procedure): Post Debridement Measurements of Total Wound Length: (cm) 0.5 Width: (cm) 0.5 Depth: (cm) 0.5 Volume: (cm) 0.098 Character of Wound/Ulcer Post Debridement: Improved Post Procedure Diagnosis Same as Pre-procedure Electronic Signature(s) Signed: 02/11/2020 4:32:20 PM By: Grover Canavan Signed: 02/11/2020 5:22:17 PM By: Worthy Keeler PA-C Entered By: Grover Canavan on 02/11/2020 13:43:05 [name], [name] ([id number]) -------------------------------------------------------------------------------- HPI Details Patient Name: [name] Date of Service: 02/11/2020 12:45 PM Medical Record Number: [id number] Patient Account Number: [id number] Date of Birth/Sex: [birth year] (84 y.o. M) Treating RN: Cornell Barman Primary Care Provider: [name] Other Clinician: Referring Provider: [name] Treating Provider/Extender: Melburn Hake, Danyel Griess Weeks in Treatment: 0 History of Present Illness HPI Description: 02/11/2020 on evaluation today patient appears to be doing somewhat poorly on initial evaluation concerning his heel. He has been using a topical antibiotic ointment which does not seem to really have been doing the best job for him. He tells me that he had a wound that occurred as a result of his padding in the bottom of his shoe wear his foot drop brace goes actually had worn through and his foot was actually rubbing on the bottom of the shoe on the brace itself. Subsequently this ended up with a wound and he in turn had a lot of issues following. Fortunately there is no signs of active infection at this time which is good news in my opinion. With that being said the wound does appear to be somewhat moist with some evidence of maceration unfortunately. He has no pain he does have neuropathy. He also has bilateral foot drop which she wears braces. This does appear to be however a pressure injury secondary to the AFO brace. The patient has been seen by podiatry  since May 2021 unfortunately this is not healing hence the reason that he is coming to see [place] at this point to see if there is anything we can do to help in this regard. 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 Electronic Signature(s) Signed: 02/11/2020 1:51:06 PM By: Worthy Keeler PA-C Entered By: Worthy Keeler on 02/11/2020 13:51:05 [name] ([id number]) -------------------------------------------------------------------------------- Physical Exam Details Patient Name: [name] Date of Service: 02/11/2020 12:45 PM Medical Record Number: [id number] Patient Account Number: [id number] Date of Birth/Sex: 08/15/1935 (84 y.o. M) Treating RN: Cornell Barman Primary Care Provider: [name] Other Clinician: Referring Provider: [name] Treating Provider/Extender: Melburn Hake, Jayme Cham Weeks in Treatment: 0 Constitutional sitting or standing blood pressure is within target range for patient.. pulse regular and within target range for patient.Marland Kitchen respirations regular, non- labored and within target range for patient.Marland Kitchen temperature within target range for patient.. Well-nourished and well-hydrated in no acute distress. Eyes conjunctiva clear no eyelid edema noted. pupils equal round and reactive to light and accommodation. Ears, Nose, Mouth, and Throat no gross abnormality of ear auricles or external auditory canals. normal hearing noted during conversation. mucus membranes moist. Respiratory normal breathing without difficulty. Cardiovascular 2+ dorsalis pedis/posterior tibialis pulses. no clubbing, cyanosis, significant edema, <3 sec cap refill. Psychiatric this patient is able to make decisions and demonstrates good insight into disease process. Alert and Oriented x 3. pleasant and cooperative. Notes Upon inspection patient's wound bed actually showed signs of good granulation for the most  part he did have some slough/biofilm buildup on the surface of the wound I did perform sharp debridement to clear this away today he tolerated that without complication post debridement wound bed appears to be doing much better which is great news. Electronic Signature(s) Signed: 02/11/2020 1:51:35 PM By: Worthy Keeler PA-C Entered By: Worthy Keeler on 02/11/2020 13:51:34 [name], [name] ([id number]) -------------------------------------------------------------------------------- Physician Orders Details Patient Name: [name] Date of Service: 02/11/2020 12:45 PM Medical Record Number: [id number] Patient Account Number: [id number] Date of Birth/Sex: 02/20/1936 (84 y.o. 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 Follow-up Appointments Wound #1 Left,Plantar Calcaneus o Return Appointment in 1 week. Electronic Signature(s) Signed: 02/11/2020 4:32:20 PM By: Grover Canavan Signed: 02/11/2020 5:22:17 PM By: Worthy Keeler PA-C Entered By: Grover Canavan on 02/11/2020 13:44:25 [name], [name] ([id number]) -------------------------------------------------------------------------------- Problem  List Details Patient Name: [name] Date of Service: 02/11/2020 12:45 PM Medical Record Number: [id number] Patient Account Number: [id number] Date of Birth/Sex: [birth year] (84 y.o. 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 M) Treating RN: Cornell Barman Primary Care Provider: [name] Other Clinician: Referring Provider: [name] Treating Provider/Extender: Melburn Hake, Kelyn Koskela Weeks in Treatment: 0 Subjective Chief Complaint Information obtained from Patient Pressure ulcer left heel History of Present Illness (HPI) 02/11/2020 on evaluation today patient appears to be doing somewhat poorly on initial evaluation concerning his heel. He has been using a topical antibiotic ointment which does not seem to really have been doing the best job for him. He tells me that he had a wound that occurred as a result of his padding in the bottom of his shoe wear his foot drop brace goes actually had worn through and his foot was actually rubbing on the bottom of the shoe on the brace  itself. Subsequently this ended up with a wound and he in turn had a lot of issues following. Fortunately there is no signs of active infection at this time which is good news in my opinion. With that being said the wound does appear to be somewhat moist with some evidence of maceration unfortunately. He has no pain he does have neuropathy. He also has bilateral foot drop which she wears braces. This does appear to be however a pressure injury secondary to the AFO brace. The patient has been seen by podiatry since May 2021 unfortunately this is not healing hence the reason that he is coming to see [place] at this point to see if there is anything we can do to help in this regard. 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 Patient History Information obtained from Patient. Allergies codeine, mussels, latex, Indocin Social History Never smoker, Marital Status - Married, Alcohol Use - Daily, Drug Use - No History, Caffeine Use - Never. Medical History Cardiovascular Patient has history of Hypertension, Myocardial Infarction - 1987 Endocrine Denies history of Type II Diabetes Integumentary (Skin) Denies history of History of Burn, History of pressure wounds Musculoskeletal Patient has history of Osteoarthritis - Right big toe Neurologic Patient has history of Neuropathy - idiopathic Medical And Surgical History Notes Oncologic melanoma. prostate cancer Review of Systems (ROS) Constitutional Symptoms (General Health) Denies complaints or symptoms of Fatigue, Fever, Chills, Marked Weight Change. Eyes Denies complaints or symptoms of Dry Eyes, Vision Changes, Glasses / Contacts. Ear/Nose/Mouth/Throat Denies complaints or symptoms of Difficult clearing ears, Sinusitis. Hematologic/Lymphatic Denies complaints or symptoms of Bleeding / Clotting Disorders, Human Immunodeficiency Virus. Respiratory Denies  complaints or symptoms of Chronic or frequent coughs, Shortness of Breath. Cardiovascular Complains or has symptoms of LE edema - Left. Gastrointestinal Denies complaints or symptoms of Frequent diarrhea, Nausea, Vomiting. Endocrine Denies complaints or symptoms of Hepatitis, Thyroid disease, Polydypsia (Excessive Thirst). Immunological Denies complaints or symptoms of Hives, Itching. Integumentary (Skin) Complains or has symptoms of Wounds, Bleeding or bruising tendency. Denies complaints or symptoms of Breakdown. Neurologic [name], [name] ([id number]) Complains or has symptoms of Numbness/parasthesias. Objective Constitutional sitting or standing blood pressure is within target range for patient.. pulse regular and within target range for patient.Marland Kitchen respirations regular, non- labored and within target range for patient.Marland Kitchen temperature within target range for patient.. Well-nourished and well-hydrated in no acute distress. Vitals Time Taken: 1:00 PM, Height: 72 in, Weight: 200 lbs, BMI: 27.1, Temperature: 98.1 F, Pulse: 95 bpm, Respiratory Rate: 18 breaths/min, Blood Pressure: 126/65 mmHg. Eyes  conjunctiva clear no eyelid edema noted. pupils equal round and reactive to light and accommodation. Ears, Nose, Mouth, and Throat no gross abnormality of ear auricles or external auditory canals. normal hearing noted during conversation. mucus membranes moist. Respiratory normal breathing without difficulty. Cardiovascular 2+ dorsalis pedis/posterior tibialis pulses. no clubbing, cyanosis, significant edema, Psychiatric this patient is able to make decisions and demonstrates good insight into disease process. Alert and Oriented x 3. pleasant and cooperative. General Notes: Upon inspection patient's wound bed actually showed signs of good granulation for the most part he did have some slough/biofilm buildup on the surface of the wound I did perform sharp debridement to clear this away today he  tolerated that without complication post debridement wound bed appears to be doing much better which is great news. Integumentary (Hair, Skin) Wound #1 status is Open. Original cause of wound was Trauma. The wound is located on the Left,Plantar Calcaneus. The wound measures 0.5cm length x 0.5cm width x 0.4cm depth; 0.196cm^2 area and 0.079cm^3 volume. There is Fat Layer (Subcutaneous Tissue) exposed. There is no tunneling or undermining noted. There is a none present amount of drainage noted. The wound margin is flat and intact. There is medium (34- 66%) pink granulation within the wound bed. There is a medium (34-66%) amount of necrotic tissue within the wound bed including Adherent Slough. Assessment Active Problems ICD-10 Pressure ulcer of left heel, stage 3 Idiopathic progressive neuropathy Foot drop, left foot Foot drop, right foot Procedures Wound #1 Pre-procedure diagnosis of Wound #1 is a Trauma, Other located on the Left,Plantar Calcaneus . There was a Excisional Skin/Subcutaneous Tissue Debridement with a total area of 0.25 sq cm performed by STONE III, Niccolo Burggraf E., PA-C. With the following instrument(s): Curette Material removed includes Callus, Subcutaneous Tissue, and Slough after achieving pain control using Lidocaine. No specimens were taken. A time out was conducted at 13:35, prior to the start of the procedure. A Minimum amount of bleeding was controlled with Pressure. The procedure was tolerated well with a pain level of 0 throughout and a pain level of 0 following the procedure. Post Debridement Measurements: 0.5cm length x 0.5cm width x 0.5cm depth; 0.098cm^3 volume. Character of Wound/Ulcer Post Debridement is improved. Post procedure Diagnosis Wound #1: Same as Pre-Procedure [name], [name] ([id number]) Plan Wound Cleansing: Wound #1 Left,Plantar Calcaneus: Dial antibacterial soap, wash wounds, rinse and pat dry prior to dressing wounds Primary Wound Dressing: Wound #1  Left,Plantar Calcaneus: Hydrafera Blue Ready Transfer - cut to fit into wound bed Secondary Dressing: Wound #1 Left,Plantar Calcaneus: Boardered Foam Dressing Dressing Change Frequency: Wound #1 Left,Plantar Calcaneus: Change dressing every other day. Follow-up Appointments: Wound #1 Left,Plantar Calcaneus: Return Appointment in 1 week. 1. I would recommend at this time that we initiate treatment with a Hydrofera Blue dressing I think that still the best way to go at this point based on what I am seeing this will provide some padding and help as well with catching some of the drainage that is occurring here while helping to improve the surface of the wound. 2. We will use a border foam dressing to cover this will catch any additional drainage coming through the [facility] and then subsequently will also provide some additional padding as well which I think can be of utmost benefit. 3. I would also recommend he try to avoid walking excessively taking it easy as much as he can I think this will go a long way toward helping this to heal. We will see patient  back for reevaluation in 1 week here in the clinic. If anything worsens or changes patient will contact our office for additional recommendations. Electronic Signature(s) Signed: 02/11/2020 1:52:16 PM By: Worthy Keeler PA-C Entered By: Worthy Keeler on 02/11/2020 13:52:15 [name], [name] ([id number]) -------------------------------------------------------------------------------- ROS/PFSH Details Patient Name: [name] Date of Service: 02/11/2020 12:45 PM Medical Record Number: [id number] Patient Account Number: [id number] Date of Birth/Sex: [birth year] (84 y.o. M) Treating RN: Grover Canavan Primary Care Provider: [name] Other Clinician: Referring Provider: [name] Treating Provider/Extender: Melburn Hake, Dali Kraner Weeks in Treatment: 0 Information Obtained From Patient Constitutional Symptoms (General  Health) Complaints and Symptoms: Negative for: Fatigue; Fever; Chills; Marked Weight Change Eyes Complaints and Symptoms: Negative for: Dry Eyes; Vision Changes; Glasses / Contacts Ear/Nose/Mouth/Throat Complaints and Symptoms: Negative for: Difficult clearing ears; Sinusitis Hematologic/Lymphatic Complaints and Symptoms: Negative for: Bleeding / Clotting Disorders; Human Immunodeficiency Virus Respiratory Complaints and Symptoms: Negative for: Chronic or frequent coughs; Shortness of Breath Cardiovascular Complaints and Symptoms: Positive for: LE edema - Left Medical History: Positive for: Hypertension; Myocardial Infarction - 1987 Gastrointestinal Complaints and Symptoms: Negative for: Frequent diarrhea; Nausea; Vomiting Endocrine Complaints and Symptoms: Negative for: Hepatitis; Thyroid disease; Polydypsia (Excessive Thirst) Medical History: Negative for: Type II Diabetes Immunological Complaints and Symptoms: Negative for: Hives; Itching Integumentary (Skin) Complaints and Symptoms: Positive for: Wounds; Bleeding or bruising tendency Negative for: VERLIN, UHER ([id number]) Medical History: Negative for: History of Burn; History of pressure wounds Neurologic Complaints and Symptoms: Positive for: Numbness/parasthesias Medical History: Positive for: Neuropathy - idiopathic Musculoskeletal Medical History: Positive for: Osteoarthritis - Right big toe Oncologic Medical History: Past Medical History Notes: melanoma. prostate cancer Immunizations Pneumococcal Vaccine: Received Pneumococcal Vaccination: Yes Implantable Devices None Family and Social History Never smoker; Marital Status - Married; Alcohol Use: Daily; Drug Use: No History; Caffeine Use: Never Electronic Signature(s) Signed: 02/11/2020 4:32:20 PM By: Grover Canavan Signed: 02/11/2020 5:22:17 PM By: Worthy Keeler PA-C Entered By: Grover Canavan on 02/11/2020 13:10:49 [name], [name]  ([id number]) -------------------------------------------------------------------------------- SuperBill Details Patient Name: [name] Date of Service: 02/11/2020 Medical Record Number: [id number] Patient Account Number: [id number] Date of Birth/Sex: 03-16-1936 (84 y.o. 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 Foot drop, left foot M21.371 Foot drop, right foot Facility Procedures CPT4 Code: [id number] Description: [place] VISIT-LEV 3 EST PT Modifier: Quantity: 1 CPT4 Code: [id number] Description: 28003 - DEB SUBQ TISSUE 20 SQ CM/< Modifier: Quantity: 1 CPT4 Code: Description: ICD-10 Diagnosis Description L89.623 Pressure ulcer of left heel, stage 3 Modifier: Quantity: Physician Procedures CPT4 Code: [id number] Description: WC PHYS LEVEL 3 o NEW PT Modifier: 25 Quantity: 1 CPT4 Code: Description: ICD-10 Diagnosis Description L89.623 Pressure ulcer of left heel, stage 3 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

## 2020-02-25 NOTE — Progress Notes (Signed)
[name] ([id number]) Visit Report for 02/11/2020 Allergy List Details Patient Name: [name] Date of Service: 02/11/2020 12:45 PM Medical Record Number: [id number] Patient Account Number: [id number] Date of Birth/Sex: April 13, 1936 (84 y.o. M) Treating RN: Grover Canavan Primary Care [name]: [name] Other Clinician: Referring [name]: [name] Treating [name]/Extender: Melburn Hake, [name]: 0 Allergies Active Allergies codeine mussels latex Indocin Allergy Notes Electronic Signature(s) Signed: 02/11/2020 4:32:20 PM By: Grover Canavan Entered By: Grover Canavan on 02/11/2020 13:05:28 [name] ([id number]) -------------------------------------------------------------------------------- Arrival Information Details Patient Name: [name] Date of Service: 02/11/2020 12:45 PM Medical Record Number: [id number] Patient Account Number: [id number] Date of Birth/Sex: April 12, 1936 (84 y.o. M) Treating RN: Grover Canavan Primary Care [name]: [name] Other Clinician: Referring Murle Otting: [name] Treating Pratt Bress/Extender: Melburn Hake, [name]: 0 Visit Information Patient Arrived: Walker Arrival Time: 12:55 Accompanied By: self Transfer Assistance: None Patient Identification Verified: Yes Secondary Verification Process Completed: Yes Electronic Signature(s) Signed: 02/11/2020 4:32:20 PM By: Grover Canavan Entered By: Grover Canavan on 02/11/2020 12:57:14 [name] ([id number]) -------------------------------------------------------------------------------- Clinic Level of Care Assessment Details Patient Name: [name] Date of Service: 02/11/2020 12:45 PM Medical Record Number: [id number] Patient Account Number: [id number] Date of Birth/Sex: [birth year] (84 y.o. M) Treating RN: Grover Canavan Primary Care Dearis Danis: [name] Other Clinician: Referring [name]:  [name] Treating Marise Knapper/Extender: Melburn Hake, [name]: 0 Clinic Level of Care Assessment Items TOOL 1 Quantity Score []  - Use when EandM and Procedure is performed on INITIAL visit 0 ASSESSMENTS - Nursing Assessment / Reassessment X - General Physical Exam (combine w/ comprehensive assessment (listed just below) when performed on new 1 20 pt. evals) X- 1 25 Comprehensive Assessment (HX, ROS, Risk Assessments, Wounds Hx, etc.) ASSESSMENTS - Wound and Skin Assessment / Reassessment []  - Dermatologic / Skin Assessment (not related to wound area) 0 ASSESSMENTS - Ostomy and/or Continence Assessment and Care []  - Incontinence Assessment and Management 0 []  - 0 Ostomy Care Assessment and Management (repouching, etc.) PROCESS - Coordination of Care []  - Simple Patient / Family Education for ongoing care 0 X- 1 20 Complex (extensive) Patient / Family Education for ongoing care X- 1 10 Staff obtains Programmer, systems, Records, Test Results / Process Orders []  - 0 Staff telephones HHA, Nursing Homes / Clarify orders / etc []  - 0 Routine Transfer to another Facility (non-emergent condition) []  - 0 Routine Hospital Admission (non-emergent condition) X- 1 15 New Admissions / Biomedical engineer / Ordering NPWT, Apligraf, etc. []  - 0 Emergency Hospital Admission (emergent condition) PROCESS - Special Needs []  - Pediatric / Minor Patient Management 0 []  - 0 Isolation Patient Management []  - 0 Hearing / Language / Visual special needs []  - 0 Assessment of Community assistance (transportation, D/C planning, etc.) []  - 0 Additional assistance / Altered mentation []  - 0 Support Surface(s) Assessment (bed, cushion, seat, etc.) INTERVENTIONS - Miscellaneous []  - External ear exam 0 []  - 0 Patient Transfer (multiple staff / Civil Service fast streamer / Similar devices) []  - 0 Simple Staple / Suture removal (25 or less) []  - 0 Complex Staple / Suture removal (26 or more) []   - 0 Hypo/Hyperglycemic Management (do not check if billed separately) X- 1 15 Ankle / Brachial Index (ABI) - do not check if billed separately Has the patient been seen at the hospital within the last three years: Yes Total Score: 105 Level Of Care: New/Established - Level 3 [name], [name] ([id number]) Electronic Signature(s) Signed: 02/11/2020 4:32:20 PM By: Melina Copa,  Sharyn Lull Entered By: Grover Canavan on 02/11/2020 13:46:12 [name] ([id number]) -------------------------------------------------------------------------------- Encounter Discharge Information Details Patient Name: [name], [name] Date of Service: 02/11/2020 12:45 PM Medical Record Number: [id number] Patient Account Number: [id number] Date of Birth/Sex: 1936-01-28 (84 y.o. M) Treating RN: Grover Canavan Primary Care [name]: [name] Other Clinician: Referring Annalyn Holt: [name] Treating [name]/Extender: Melburn Hake, [name]: 0 Encounter Discharge Information Items Post Procedure Vitals Discharge Condition: Stable Temperature (F): 98.1 Ambulatory Status: Ambulatory Pulse (bpm): 95 Discharge Destination: Home Respiratory Rate (breaths/min): 18 Transportation: Private Auto Blood Pressure (mmHg): 126/65 Accompanied By: self Schedule Follow-up Appointment: Yes Clinical Summary of Care: Electronic Signature(s) Signed: 02/11/2020 4:32:20 PM By: Grover Canavan Entered By: Grover Canavan on 02/11/2020 13:49:21 [name], [name] ([id number]) -------------------------------------------------------------------------------- Lower Extremity Assessment Details Patient Name: [name] Date of Service: 02/11/2020 12:45 PM Medical Record Number: [id number] Patient Account Number: [id number] Date of Birth/Sex: Sep 01, 1935 (84 y.o. M) Treating RN: Grover Canavan Primary Care [name]: [name] Other Clinician: Referring [name]: [name] Treating  Indya Oliveria/Extender: Melburn Hake, [name]: 0 Edema Assessment Assessed: [Left: Yes] [Right: No] Edema: [Left: Ye] [Right: s] Calf Left: Right: Point of Measurement: 33 cm From Medial Instep 42.2 cm cm Ankle Left: Right: Point of Measurement: 13 cm From Medial Instep 26 cm cm Vascular Assessment Pulses: Dorsalis Pedis Palpable: [Left:Yes] Doppler Audible: [Left:Yes] Posterior Tibial Palpable: [Left:Yes] Doppler Audible: [Left:Yes] Blood Pressure: Brachial: [Left:112] Dorsalis Pedis: 150 Ankle: Posterior Tibial: 110 Ankle Brachial Index: [Left:1.34] Electronic Signature(s) Signed: 02/11/2020 4:32:20 PM By: Grover Canavan Entered By: Grover Canavan on 02/11/2020 13:24:23 [name] ([id number]) -------------------------------------------------------------------------------- Multi Wound Chart Details Patient Name: [name] Date of Service: 02/11/2020 12:45 PM Medical Record Number: [id number] Patient Account Number: [id number] Date of Birth/Sex: 07/23/1935 (84 y.o. M) Treating RN: Grover Canavan Primary Care Juliene Kirsh: [name] Other Clinician: Referring Rey Fors: [name] Treating Jaksen Fiorella/Extender: Melburn Hake, [name]: 0 Vital Signs Height(in): 72 Pulse(bpm): 95 Weight(lbs): 200 Blood Pressure(mmHg): 126/65 Body Mass Index(BMI): 27 Temperature(F): 98.1 Respiratory Rate(breaths/min): 18 Photos: [N/A:N/A] Wound Location: Left, Plantar Calcaneus N/A N/A Wounding Event: Trauma N/A N/A Primary Etiology: Trauma, Other N/A N/A Secondary Etiology: Pressure Ulcer N/A N/A Comorbid History: Hypertension, Myocardial Infarction, N/A N/A Osteoarthritis, Neuropathy Date Acquired: 10/21/2019 N/A N/A Weeks of Holt: 0 N/A N/A Wound Status: Open N/A N/A Measurements L x W x D (cm) 0.5x0.5x0.4 N/A N/A Area (cm) : 0.196 N/A N/A Volume (cm) : 0.079 N/A N/A % Reduction in Area: 0.00% N/A N/A % Reduction in Volume:  0.00% N/A N/A Classification: Full Thickness Without Exposed N/A N/A Support Structures Exudate Amount: None Present N/A N/A Wound Margin: Flat and Intact N/A N/A Granulation Amount: Medium (34-66%) N/A N/A Granulation Quality: Pink N/A N/A Necrotic Amount: Medium (34-66%) N/A N/A Exposed Structures: Fat Layer (Subcutaneous Tissue): N/A N/A Yes Fascia: No Tendon: No Muscle: No Joint: No Bone: No Epithelialization: None N/A N/A Holt Notes Electronic Signature(s) Signed: 02/11/2020 4:32:20 PM By: Grover Canavan Entered By: Grover Canavan on 02/11/2020 13:34:41 [name] ([id number]) -------------------------------------------------------------------------------- Multi-Disciplinary Care Plan Details Patient Name: [name] Date of Service: 02/11/2020 12:45 PM Medical Record Number: [id number] Patient Account Number: [id number] Date of Birth/Sex: November 24, 1935 (84 y.o. M) Treating RN: Grover Canavan Primary Care Fitzgerald Dunne: [name] Other Clinician: Referring Hunter Pinkard: [name] Treating Lannie Yusuf/Extender: Melburn Hake, [name]: 0 Active Inactive Orientation to the Wound Care Program Nursing Diagnoses: Knowledge deficit related to the wound healing center program Goals: Patient/caregiver will verbalize understanding of the Wound  Healing Center Program Date Initiated: 02/11/2020 Target Resolution Date: 02/15/2020 Goal Status: Active Interventions: Provide education on orientation to the wound center Notes: Wound/Skin Impairment Nursing Diagnoses: Impaired tissue integrity Knowledge deficit related to ulceration/compromised skin integrity Goals: Patient/caregiver will verbalize understanding of skin care regimen Date Initiated: 02/11/2020 Target Resolution Date: 02/24/2020 Goal Status: Active Interventions: Assess patient/caregiver ability to obtain necessary supplies Assess patient/caregiver ability to perform ulcer/skin care  regimen upon admission and as needed Assess ulceration(s) every visit Provide education on ulcer and skin care Holt Activities: Skin care regimen initiated : 02/11/2020 Notes: Electronic Signature(s) Signed: 02/11/2020 4:32:20 PM By: Grover Canavan Entered By: Grover Canavan on 02/11/2020 13:34:12 [name], [name] ([id number]) -------------------------------------------------------------------------------- Pain Assessment Details Patient Name: [name] Date of Service: 02/11/2020 12:45 PM Medical Record Number: [id number] Patient Account Number: [id number] Date of Birth/Sex: Apr 25, 1936 (84 y.o. M) Treating RN: Grover Canavan Primary Care Tivon Lemoine: [name] Other Clinician: Referring Kalob Bergen: [name] Treating Keyshon Stein/Extender: Melburn Hake, [name]: 0 Active Problems Location of Pain Severity and Description of Pain Patient Has Paino No Site Locations Pain Management and Medication Current Pain Management: Electronic Signature(s) Signed: 02/11/2020 4:32:20 PM By: Grover Canavan Entered By: Grover Canavan on 02/11/2020 13:00:56 [name] ([id number]) -------------------------------------------------------------------------------- Patient/Caregiver Education Details Patient Name: [name] Date of Service: 02/11/2020 12:45 PM Medical Record Number: [id number] Patient Account Number: [id number] Date of Birth/Gender: 16-Oct-1935 (84 y.o. M) Treating RN: Grover Canavan Primary Care Physician: [name] Other Clinician: Referring Physician: [name] Treating Physician/Extender: Sharalyn Ink in Holt: 0 Education Assessment Education Provided To: Patient Education Topics Provided Wound/Skin Impairment: Handouts: Caring for Your Ulcer Methods: Explain/Verbal Responses: State content correctly Electronic Signature(s) Signed: 02/11/2020 4:32:20 PM By: Grover Canavan Entered By: Grover Canavan on 02/11/2020 13:47:54 [name], [name] ([id number]) -------------------------------------------------------------------------------- Wound Assessment Details Patient Name: [name] Date of Service: 02/11/2020 12:45 PM Medical Record Number: [id number] Patient Account Number: [id number] Date of Birth/Sex: 11/23/1935 (84 y.o. M) Treating RN: Grover Canavan Primary Care Rudolfo Brandow: [name] Other Clinician: Referring Dariella Gillihan: [name] Treating Nohelia Valenza/Extender: Melburn Hake, [name]: 0 Wound Status Wound Number: 1 Primary Etiology: Trauma, Other Wound Location: Left, Plantar Calcaneus Secondary Pressure Ulcer Etiology: Wounding Event: Trauma Wound Status: Open Date Acquired: 10/21/2019 Comorbid Hypertension, Myocardial Infarction, Osteoarthritis, Weeks Of Holt: 0 History: Neuropathy Clustered Wound: No Photos Wound Measurements Length: (cm) 0.5 Width: (cm) 0.5 Depth: (cm) 0.4 Area: (cm) 0.196 Volume: (cm) 0.079 % Reduction in Area: 0% % Reduction in Volume: 0% Epithelialization: None Tunneling: No Undermining: No Wound Description Classification: Full Thickness Without Exposed Support Structure Wound Margin: Flat and Intact Exudate Amount: None Present s Foul Odor After Cleansing: No Slough/Fibrino No Wound Bed Granulation Amount: Medium (34-66%) Exposed Structure Granulation Quality: Pink Fascia Exposed: No Necrotic Amount: Medium (34-66%) Fat Layer (Subcutaneous Tissue) Exposed: Yes Necrotic Quality: Adherent Slough Tendon Exposed: No Muscle Exposed: No Joint Exposed: No Bone Exposed: No Holt Notes Wound #1 (Left, Plantar Calcaneus) Notes Hblue, BFD Electronic Signature(s) Signed: 02/11/2020 4:32:20 PM By: Grover Canavan Entered By: Grover Canavan on 02/11/2020 13:17:04 [name], [name] ([id number] Holt  ([id number]) -------------------------------------------------------------------------------- Vitals Details Patient Name: [name] Date of Service: 02/11/2020 12:45 PM Medical Record Number: [id number] Patient Account Number: [id number] Date of Birth/Sex: [birth year] (84 y.o. M) Treating RN: Grover Canavan Primary Care Carey Johndrow: [name] Other Clinician: Referring Tishina Lown: [name] Treating Annis Lagoy/Extender: Melburn Hake, [name]: 0 Vital Signs Time Taken: 13:00 Temperature (F): 98.1 Height (in): 72 Pulse (bpm): 95  Weight (lbs): 200 Respiratory Rate (breaths/min): 18 Body Mass Index (BMI): 27.1 Blood Pressure (mmHg): 126/65 Reference Range: 80 - 120 mg / dl Electronic Signature(s) Signed: 02/11/2020 4:32:20 PM By: Grover Canavan Entered By: Grover Canavan on 02/11/2020 13:02:17

## 2020-02-26 ENCOUNTER — Other Ambulatory Visit: Payer: Self-pay

## 2020-02-26 ENCOUNTER — Encounter: Payer: Medicare Other | Attending: Internal Medicine | Admitting: Internal Medicine

## 2020-02-26 DIAGNOSIS — L97422 Non-pressure chronic ulcer of left heel and midfoot with fat layer exposed: Secondary | ICD-10-CM | POA: Diagnosis not present

## 2020-02-26 DIAGNOSIS — M21371 Foot drop, right foot: Secondary | ICD-10-CM | POA: Diagnosis not present

## 2020-02-26 DIAGNOSIS — M21372 Foot drop, left foot: Secondary | ICD-10-CM | POA: Diagnosis not present

## 2020-02-26 DIAGNOSIS — M1990 Unspecified osteoarthritis, unspecified site: Secondary | ICD-10-CM | POA: Diagnosis not present

## 2020-02-26 DIAGNOSIS — I252 Old myocardial infarction: Secondary | ICD-10-CM | POA: Diagnosis not present

## 2020-02-26 DIAGNOSIS — L89623 Pressure ulcer of left heel, stage 3: Secondary | ICD-10-CM | POA: Insufficient documentation

## 2020-02-26 DIAGNOSIS — G609 Hereditary and idiopathic neuropathy, unspecified: Secondary | ICD-10-CM | POA: Insufficient documentation

## 2020-02-26 DIAGNOSIS — I10 Essential (primary) hypertension: Secondary | ICD-10-CM | POA: Insufficient documentation

## 2020-02-26 DIAGNOSIS — G603 Idiopathic progressive neuropathy: Secondary | ICD-10-CM | POA: Diagnosis not present

## 2020-03-01 ENCOUNTER — Encounter: Payer: Self-pay | Admitting: Emergency Medicine

## 2020-03-01 ENCOUNTER — Other Ambulatory Visit: Payer: Self-pay

## 2020-03-01 ENCOUNTER — Ambulatory Visit
Admission: EM | Admit: 2020-03-01 | Discharge: 2020-03-01 | Disposition: A | Discharge status: Home / Self Care | Payer: Medicare Other | Attending: Physician Assistant | Admitting: Physician Assistant

## 2020-03-01 DIAGNOSIS — L03116 Cellulitis of left lower limb: Secondary | ICD-10-CM | POA: Diagnosis not present

## 2020-03-01 DIAGNOSIS — L97529 Non-pressure chronic ulcer of other part of left foot with unspecified severity: Secondary | ICD-10-CM | POA: Diagnosis not present

## 2020-03-01 MED ORDER — CEPHALEXIN 500 MG PO CAPS: 500.0000 mg | ORAL_CAPSULE | Freq: Four times a day (QID) | ORAL | 0 refills | Status: AC

## 2020-03-01 MED ORDER — CEFTRIAXONE SODIUM 500 MG IJ SOLR
1000.0000 mg | Freq: Once | INTRAMUSCULAR | Status: AC
  Administered 2020-03-01: 1000 mg via INTRAMUSCULAR

## 2020-03-01 MED ORDER — DOXYCYCLINE HYCLATE 100 MG PO CAPS: 100.0000 mg | ORAL_CAPSULE | Freq: Two times a day (BID) | ORAL | 0 refills | Status: AC

## 2020-03-01 NOTE — Discharge Instructions (Signed)
Begin both antibiotics for your leg infection.  I have outlined the area of redness.  If your redness spreads beyond this point you need to go to the emergency room.  If you develop a fever you need to go to the emergency room.  If you feel weak and nauseated or have vomiting you should go to the ED.  Please continue to follow-up with wound care and your nurse about your chronic ulcer.  For any new or worsening symptoms again, go to the ED.

## 2020-03-01 NOTE — ED Triage Notes (Signed)
Pt c/o left leg pain, redness, warmth and swelling. Started about 3 days ago but the redness has spread further up his thigh this morning. Pt had a skin lesion removed 2 days prior to the pain and redness starting.

## 2020-03-01 NOTE — ED Provider Notes (Addendum)
[facility]    CSN: [id number] Arrival date & time: 03/01/20  1108      History   Chief Complaint Chief Complaint  Patient presents with  . Cellulitis    HPI [name] is a 84 y.o. male.   Patient presents for increased redness, swelling and warmth of the left leg for the past 3 days.  He says that symptoms worsened this morning and he noticed redness spreading up his thigh.  He admits to having a minor skin cancer removed from around the knee region 2 days before onset of symptoms.  He does have a chronic ulcer of the left foot that he follows up with wound care for.  He has an appointment this coming week.  He also has a home wound care nurse who changes his wound dressing daily.  Patient denies any fevers or weakness.  He does have a history of diabetes with peripheral neuropathy.  He says he does not have much sensation below the knee. Admits to tenderness around the knee, but says it is not too bad.  He was seen in our clinic about a month ago and treated for cellulitis of the same leg.  He says he has issues with his leg getting infected often.  He was treated with doxycycline at that time and was previously on Keflex.  He says after he took the medications the redness resolved completely until couple days ago.  He does have a history of MRSA.  Patient denies any further concerns today.  HPI  Past Medical History:  Diagnosis Date  . Arthritis   . Benign essential tremor   . Fracture, femur ([place]) 2012  . GERD (gastroesophageal reflux disease)   . H/O echocardiogram    05/2013  . Hx of colonoscopy    04/22/2013  . Hypercholesterolemia   . Hypertension   . Myocardial infarction ([place])    1987  . Polyneuropathy   . Prostate cancer ([place]) 2004  . Superficial hematoma     Patient Active Problem List   Diagnosis Date Noted  . Osteoarthritis of multiple joints 12/15/2017  . Chronic hyponatremia 03/28/2017  . Abnormality of gait and mobility 01/11/2017  . Cloudy  vision 01/11/2017  . Foot drop, bilateral 01/11/2017  . Amputee, great toe, left ([place]) 01/11/2017  . 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 Macrocytic anemia 09/22/2015  . Moderate tricuspid insufficiency 04/21/2015  . History of prostate cancer 04/16/2015  . Idiopathic peripheral neuropathy 04/16/2015  . Essential hypertension 04/16/2015  . GERD (gastroesophageal reflux disease) 04/16/2015  . Arthritis 04/16/2015  . Pedal edema 04/16/2015  . Benign essential tremor 10/30/2014  . Hyperlipidemia, mixed 10/27/2014  . Chronic systolic CHF (congestive heart failure), NYHA class 2 ([place]) 04/23/2014  . Mitral insufficiency 11/05/2013  . CAD (coronary artery disease) 11/01/2013  . Basal cell carcinoma of skin of other parts of face 03/14/2013  . Ankle sprain 09/09/2011  . History of fibula fracture 09/09/2011    Past Surgical History:  Procedure Laterality Date  . BASAL CELL CARCINOMA EXCISION    . COLONOSCOPY  05/01/2009   Positive for colonic polyps  . COLONOSCOPY WITH PROPOFOL N/A 02/24/2016   Procedure: COLONOSCOPY WITH PROPOFOL;  Surgeon: Christene Lye, MD;  Location: [facility] ENDOSCOPY;  Service: Endoscopy;  Laterality: N/A;  . CORONARY ARTERY BYPASS GRAFT  1987   X2  . HERNIA REPAIR  1990  . PROSTATE SURGERY  2004       Home Medications  Prior to Admission medications   Medication Sig Start Date End Date Taking? Authorizing Provider  Alpha Lipoic Acid 200 MG CAPS Take 2 capsules by mouth daily.   Yes [provider]  amLODipine (NORVASC) 2.5 MG tablet TAKE 1 TABLET BY MOUTH  DAILY 02/23/20  Yes Karamalegos, Devonne Doughty, DO  Apoaequorin (PREVAGEN PO) Take by mouth.   Yes [provider]  aspirin 81 MG tablet Take 81 mg by mouth daily.    Yes [provider]  azelastine (ASTELIN) 0.1 % nasal spray Place 1 spray into both nostrils 2 (two) times daily. Use in each nostril as directed 12/23/19  Yes Karamalegos, Devonne Doughty, DO   B Complex Vitamins (VITAMIN-B COMPLEX PO) Take 1 tablet by mouth daily. Reported on 10/06/2015   Yes [provider]  Biotin 1000 MCG tablet Take 1,000 mcg by mouth daily.   Yes [provider]  Calcium Carb-Cholecalciferol 500-100 MG-UNIT CHEW Chew 500 mg by mouth daily.   Yes [provider]  cyanocobalamin 1000 MCG tablet Take 1,000 mcg by mouth daily.   Yes [provider]  furosemide (LASIX) 20 MG tablet TAKE 1 TABLET BY MOUTH  DAILY 02/23/20  Yes Karamalegos, Devonne Doughty, DO  gabapentin (NEURONTIN) 600 MG tablet TAKE 2 TABLETS BY MOUTH  TWICE DAILY 02/23/20  Yes Karamalegos, Devonne Doughty, DO  Glucosamine Sulfate 1000 MG CAPS Take 1 capsule by mouth daily.   Yes [provider]  hydrochlorothiazide (HYDRODIURIL) 25 MG tablet Take 1 tablet (25 mg total) by mouth 2 (two) times daily. 09/20/19  Yes Karamalegos, Devonne Doughty, DO  Lecithin 400 MG CAPS Take 400 mg by mouth daily.   Yes [provider]  losartan (COZAAR) 50 MG tablet TAKE 1 TABLET BY MOUTH  DAILY 02/24/20  Yes Karamalegos, Devonne Doughty, DO  Melatonin 3 MG CAPS Take 3 mg by mouth at bedtime.   Yes [provider]  Omega 3 1000 MG CAPS Take 1,000 mg by mouth daily.   Yes [provider]  omeprazole (PRILOSEC) 20 MG capsule TAKE 1 CAPSULE BY MOUTH  TWICE DAILY BEFORE MEALS 02/23/20  Yes Karamalegos, Devonne Doughty, DO  ondansetron (ZOFRAN-ODT) 8 MG disintegrating tablet Take 1 tablet (8 mg total) by mouth daily as needed for nausea or vomiting. 09/05/19  Yes Karamalegos, Devonne Doughty, DO  potassium chloride SA (KLOR-CON) 20 MEQ tablet Take 1 tablet (20 mEq total) by mouth 2 (two) times daily. 12/19/19  Yes Karamalegos, Devonne Doughty, DO  pravastatin (PRAVACHOL) 80 MG tablet TAKE 1 TABLET BY MOUTH  DAILY 02/23/20  Yes Karamalegos, Devonne Doughty, DO  primidone (MYSOLINE) 50 MG tablet TAKE 1 TABLET BY MOUTH 3  TIMES DAILY 02/24/20  Yes Karamalegos, Devonne Doughty, DO  Selenium 200 MCG CAPS  Take 200 mcg by mouth daily.   Yes [provider]  tretinoin (RETIN-A) 0.05 % cream Apply 1 application topically at bedtime.   Yes [provider]  folic acid (FOLVITE) 1 MG tablet Take 1 mg by mouth daily.    [provider]  meloxicam (MOBIC) 15 MG tablet Take 1 tablet (15 mg total) by mouth daily as needed for pain. For up to 1-2 weeks, then reduce to less frequent for longer if need 03/06/18   [name], DO    Family History Family History  Problem Relation Age of Onset  . Heart disease Mother   . Congestive Heart Failure Father     Social History Social History   Tobacco Use  .  Smoking status: Never Smoker  . Smokeless tobacco: Never Used  Vaping Use  . Vaping Use: Never used  Substance Use Topics  . Alcohol use: Yes    Alcohol/week: 7.0 standard drinks    Types: 7 Glasses of wine per week    Comment: wine in the evening with dinner  . Drug use: No     Allergies   Codeine, Indocin [indomethacin], Latex, Mupirocin, Plavix [clopidogrel bisulfate], Polysporin [bacitracin-polymyxin b], Shellfish allergy, Tape, Neosporin [neomycin-bacitracin zn-polymyx], and Other   Review of Systems Review of Systems  Constitutional: Negative for fatigue and fever.  Cardiovascular: Positive for leg swelling. Negative for chest pain.  Gastrointestinal: Negative for abdominal pain, nausea and vomiting.  Musculoskeletal: Positive for gait problem. Negative for arthralgias and joint swelling.  Skin: Positive for color change and wound. Negative for rash.  Neurological: Positive for numbness. Negative for weakness.     Physical Exam Triage Vital Signs ED Triage Vitals  Enc Vitals Group     BP 03/01/20 1128 101/63     Pulse Rate 03/01/20 1128 86     Resp 03/01/20 1128 18     Temp 03/01/20 1128 98.7 F (37.1 C)     Temp Source 03/01/20 1128 Oral     SpO2 03/01/20 1128 97 %     Weight 03/01/20 1125 197 lb 15.6 oz (89.8 kg)     Height  03/01/20 1125 5\' 11"  (1.803 m)     Head Circumference --      Peak Flow --      Pain Score 03/01/20 1125 1     Pain Loc --      Pain Edu? --      Excl. in [place]? --    No data found.  Updated Vital Signs BP 101/63 (BP Location: Right Arm)   Pulse 86   Temp 98.7 F (37.1 C) (Oral)   Resp 18   Ht 5\' 11"  (1.803 m)   Wt 197 lb 15.6 oz (89.8 kg)   SpO2 97%   BMI 27.61 kg/m       Physical Exam Vitals and nursing note reviewed.  Constitutional:      General: He is not in acute distress.    Appearance: Normal appearance. He is well-developed and normal weight. He is not ill-appearing, toxic-appearing or diaphoretic.  HENT:     Head: Normocephalic and atraumatic.  Eyes:     General: No scleral icterus.    Conjunctiva/sclera: Conjunctivae normal.  Cardiovascular:     Rate and Rhythm: Normal rate and regular rhythm.     Heart sounds: Normal heart sounds. No murmur heard.   Pulmonary:     Effort: Pulmonary effort is normal. No respiratory distress.     Breath sounds: Normal breath sounds.  Musculoskeletal:     Cervical back: Neck supple.  Skin:    General: Skin is warm and dry.     Findings: Erythema (from the mid thigh to the foot, there is erythema and increased warmth. See photo. He has full ROM of the knee without pain) and wound (there is a superficial wound distal to the knee (see photo). No drainage from this area and it is nontender) present.     Comments: Patient has another chronic ulcer of the plantar foot. It is bandaged. The wound is not draining and appears unchanged from previous visit on 02/04/2020  Neurological:     General: No focal deficit present.     Mental Status: He is alert. Mental status  is at baseline.     Motor: No weakness.     Gait: Gait abnormal (ambulates with walker, has bilateral lower leg braces).  Psychiatric:        Mood and Affect: Mood normal.        Behavior: Behavior normal.        Thought Content: Thought content normal.           UC Treatments / Results  Labs (all labs ordered are listed, but only abnormal results are displayed) Labs Reviewed - No data to display  EKG   Radiology No results found.  Procedures Procedures (including critical care time)  Medications Ordered in UC Medications  cefTRIAXone (ROCEPHIN) injection 1,000 mg (1,000 mg Intramuscular Given 03/01/20 1209)    Initial Impression / Assessment and Plan / UC Course  I have reviewed the triage vital signs and the nursing notes.  Pertinent labs & imaging results that were available during my care of the patient were reviewed by me and considered in my medical decision making (see chart for details).    84 y/o male with complicated diabetes has obvious cellulitis of the left leg.  I have outlined the area of redness today.  He has been given a gram of rocephin.  The patient did state that he had full resolution of the cellulitis a month ago with Keflex and doxycycline, I have represcribed them.  Sent 10-day course.  Advised him to follow-up closely with his wound care clinicians and his nurse.  \  All of patient's vital signs are stable, so I believe he is okay to be cared for at home especially since he has a nurse who comes in daily and checks on him and changes his wound dressing.  I discussed strict ED precautions including advising him to go if he develops a fever, if the area of redness spreads beyond the line I have marked, there is no improvement in 2 days, he feels weak/nauseous/dizzy, or has any new or worsening symptoms at all.  Discussed increasing rest and fluid intake as well.  Advised him to keep a close eye on his blood sugar.  Advised him to follow-up with his regular PCP as well.  Final Clinical Impressions(s) / UC Diagnoses   Final diagnoses:  Cellulitis of leg, left  Ulcer of foot, chronic, left, with unspecified severity [facility])     Discharge Instructions     Begin both antibiotics for your leg infection.   I have outlined the area of redness.  If your redness spreads beyond this point you need to go to the emergency room.  If you develop a fever you need to go to the emergency room.  If you feel weak and nauseated or have vomiting you should go to the ED.  Please continue to follow-up with wound care and your nurse about your chronic ulcer.  For any new or worsening symptoms again, go to the ED.    ED Prescriptions    Medication Sig Dispense Auth. Provider   doxycycline (VIBRAMYCIN) 100 MG capsule Take 1 capsule (100 mg total) by mouth 2 (two) times daily for 10 days. 20 capsule Laurene Footman B, PA-C   cephALEXin (KEFLEX) 500 MG capsule Take 1 capsule (500 mg total) by mouth 4 (four) times daily for 10 days. 40 capsule Danton Clap, PA-C     PDMP not reviewed this encounter.   Danton Clap, PA-C 03/01/20 1227    Laurene Footman B, PA-C 03/28/20 1202

## 2020-03-04 DIAGNOSIS — I2510 Atherosclerotic heart disease of native coronary artery without angina pectoris: Secondary | ICD-10-CM | POA: Diagnosis not present

## 2020-03-04 DIAGNOSIS — I5022 Chronic systolic (congestive) heart failure: Secondary | ICD-10-CM | POA: Diagnosis not present

## 2020-03-05 ENCOUNTER — Encounter: Payer: Medicare Other | Admitting: Physician Assistant

## 2020-03-05 ENCOUNTER — Other Ambulatory Visit: Payer: Self-pay

## 2020-03-05 DIAGNOSIS — G603 Idiopathic progressive neuropathy: Secondary | ICD-10-CM | POA: Diagnosis not present

## 2020-03-05 DIAGNOSIS — L97422 Non-pressure chronic ulcer of left heel and midfoot with fat layer exposed: Secondary | ICD-10-CM | POA: Diagnosis not present

## 2020-03-05 DIAGNOSIS — L89623 Pressure ulcer of left heel, stage 3: Secondary | ICD-10-CM | POA: Diagnosis not present

## 2020-03-05 DIAGNOSIS — M21372 Foot drop, left foot: Secondary | ICD-10-CM | POA: Diagnosis not present

## 2020-03-05 DIAGNOSIS — M21371 Foot drop, right foot: Secondary | ICD-10-CM | POA: Diagnosis not present

## 2020-03-05 DIAGNOSIS — I10 Essential (primary) hypertension: Secondary | ICD-10-CM | POA: Diagnosis not present

## 2020-03-05 DIAGNOSIS — G609 Hereditary and idiopathic neuropathy, unspecified: Secondary | ICD-10-CM | POA: Diagnosis not present

## 2020-03-05 NOTE — Progress Notes (Addendum)
[name] ([id number]) Visit Report for 03/05/2020 Chief Complaint Document Details Patient Name: [name], [name] Date of Service: 03/05/2020 2:15 PM Medical Record Number: [id number] Patient Account Number: [id number] Date of Birth/Sex: 30-Nov-1935 (84 y.o. M) Treating RN: Cornell Barman Primary Care Provider: [name] Other Clinician: Referring Provider: [name] Treating Provider/Extender: Melburn Hake, Autum Benfer Weeks in Treatment: 3 Information Obtained from: Patient Chief Complaint Pressure ulcer left heel Electronic Signature(s) Signed: 03/05/2020 2:43:10 PM By: Worthy Keeler PA-C Entered By: Worthy Keeler on 03/05/2020 14:43:10 Kassim, [name] ([id number]) -------------------------------------------------------------------------------- Debridement Details Patient Name: [name] Date of Service: 03/05/2020 2:15 PM Medical Record Number: [id number] Patient Account Number: [id number] Date of Birth/Sex: 1935-08-22 (84 y.o. M) Treating RN: Grover Canavan Primary Care Provider: [name] Other Clinician: Referring Provider: [name] Treating Provider/Extender: Melburn Hake, Kashira Behunin Weeks in Treatment: 3 Debridement Performed for Wound #1 Left,Plantar Calcaneus Assessment: Performed By: Physician STONE III, Dalasia Predmore E., PA-C Debridement Type: Debridement Level of Consciousness (Pre- Awake and Alert procedure): Pre-procedure Verification/Time Out Yes - 14:46 Taken: Start Time: 14:17 Pain Control: Lidocaine Total Area Debrided (L x W): 0.4 (cm) x 0.4 (cm) = 0.16 (cm) Tissue and other material Non-Viable, Callus debrided: Level: Non-Viable Tissue Debridement Description: Selective/Open Wound Instrument: Curette Bleeding: None End Time: 14:50 Procedural Pain: 0 Post Procedural Pain: 0 Response to Treatment: Procedure was tolerated well Level of Consciousness (Post- Awake and Alert procedure): Post Debridement Measurements of Total  Wound Length: (cm) 0.4 Width: (cm) 0.4 Depth: (cm) 1 Volume: (cm) 0.126 Character of Wound/Ulcer Post Debridement: Improved Post Procedure Diagnosis Same as Pre-procedure Electronic Signature(s) Signed: 03/05/2020 5:09:50 PM By: Grover Canavan Signed: 03/05/2020 5:29:43 PM By: Worthy Keeler PA-C Entered By: Grover Canavan on 03/05/2020 14:49:29 Bettinger, [name] ([id number]) -------------------------------------------------------------------------------- HPI Details Patient Name: [name] Date of Service: 03/05/2020 2:15 PM Medical Record Number: [id number] Patient Account Number: [id number] Date of Birth/Sex: June 22, 1935 (84 y.o. M) Treating RN: Cornell Barman Primary Care Provider: [name] Other Clinician: Referring Provider: [name] Treating Provider/Extender: Melburn Hake, Stephnie Parlier Weeks in Treatment: 3 History of Present Illness HPI Description: 02/11/2020 on evaluation today patient appears to be doing somewhat poorly on initial evaluation concerning his heel. He has been using a topical antibiotic ointment which does not seem to really have been doing the best job for him. He tells me that he had a wound that occurred as a result of his padding in the bottom of his shoe wear his foot drop brace goes actually had worn through and his foot was actually rubbing on the bottom of the shoe on the brace itself. Subsequently this ended up with a wound and he in turn had a lot of issues following. Fortunately there is no signs of active infection at this time which is good news in my opinion. With that being said the wound does appear to be somewhat moist with some evidence of maceration unfortunately. He has no pain he does have neuropathy. He also has bilateral foot drop which she wears braces. This does appear to be however a pressure injury secondary to the AFO brace. The patient has been seen by podiatry since May 2021 unfortunately this is not healing hence the reason  that he is coming to see [place] at this point to see if there is anything we can do to help in this regard. 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 9/15; the patient  missed his appointment last week due to illness with his wife who had to be hospitalized. We are using Hydrofera Blue to the small punched out area in the middle of the left plantar heel. 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 This is felt to be pressure from the brace itself. 03/05/2020 upon evaluation today patient appears to be doing well in regard to his wound on the heel. He has been tolerating the dressing changes without complication which is great news. Fortunately there is no signs of active infection which I am very happy with. Electronic Signature(s) Signed: 03/05/2020 4:32:01 PM By: Worthy Keeler PA-C Entered By: Worthy Keeler on 03/05/2020 16:32:00 [name] ([id number]) -------------------------------------------------------------------------------- Physical Exam Details Patient Name: [name] Date of Service: 03/05/2020 2:15 PM Medical Record Number: [id number] Patient Account Number: [id number] Date of Birth/Sex: [birth year] (84 y.o. M) Treating RN: Cornell Barman Primary Care Provider: [name] Other Clinician: Referring Provider: [name] Treating Provider/Extender: Melburn Hake, Justene Jensen Weeks in Treatment: 3 Constitutional Well-nourished and well-hydrated in no acute distress. Respiratory normal breathing without difficulty. Psychiatric this patient is able to make decisions and demonstrates good insight into disease process. Alert and Oriented x 3. pleasant and cooperative. Notes Upon inspection patient's wound bed actually showed signs of good granulation at this time. There was some callus around the edges of the wound which did require sharp debridement to clear this  away today. Post debridement wound bed appears to be doing somewhat better. Electronic Signature(s) Signed: 03/05/2020 4:32:16 PM By: Worthy Keeler PA-C Entered By: Worthy Keeler on 03/05/2020 16:32:16 Kunesh, [name] ([id number]) -------------------------------------------------------------------------------- Physician Orders Details Patient Name: [name] Date of Service: 03/05/2020 2:15 PM Medical Record Number: [id number] Patient Account Number: [id number] Date of Birth/Sex: [birth year] (84 y.o. 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 Follow-up Appointments Wound #1 Left,Plantar Calcaneus o Return Appointment in 2 weeks. Electronic Signature(s) Signed: 03/05/2020 5:09:50 PM By: Grover Canavan Signed: 03/05/2020 5:29:43 PM By: Worthy Keeler PA-C Entered By: Grover Canavan on 03/05/2020 14:55:04 [place], [name] ([id number]) -------------------------------------------------------------------------------- Problem List Details Patient Name: [name] Date of Service: 03/05/2020 2:15 PM Medical Record Number: [id number] Patient Account Number: [id number] Date of  Birth/Sex: 01-Sep-1935 (84 y.o. M) Treating RN: Cornell Barman Primary Care Provider: [name] Other Clinician: Referring Provider: [name] Treating Provider/Extender: Melburn Hake, Nicolasa Milbrath Weeks in Treatment: 3 Active Problems ICD-10 Encounter Code Description Active Date MDM Diagnosis L89.623 Pressure ulcer of left heel, stage 3 02/11/2020 No Yes G60.3 Idiopathic progressive neuropathy 02/11/2020 No Yes M21.372 Foot drop, left foot 02/11/2020 No Yes M21.371 Foot drop, right foot 02/11/2020 No Yes Inactive Problems Resolved Problems Electronic Signature(s) Signed: 03/05/2020 2:43:03 PM By: Worthy Keeler PA-C Entered By: Worthy Keeler on 03/05/2020 14:43:02 Dewalt, [name] ([id number]) -------------------------------------------------------------------------------- Progress Note Details Patient  Name: PEARSE, SHIFFLER Date of Service: 03/05/2020 2:15 PM Medical Record Number: [id number] Patient Account Number: [id number] Date of Birth/Sex: 08/04/1935 (84 y.o. M) Treating RN: Cornell Barman Primary Care Provider: [name] Other Clinician: Referring Provider: [name] Treating Provider/Extender: Melburn Hake, Shavaun Osterloh Weeks in Treatment: 3 Subjective Chief Complaint Information obtained from Patient Pressure ulcer left heel History of Present Illness (HPI) 02/11/2020 on evaluation today patient appears to be doing somewhat poorly on initial evaluation concerning his heel. He has been using a topical antibiotic ointment which does not seem to really have been doing the best job for him. He tells me that he had a wound that occurred as a result of his padding in the bottom of his shoe wear his foot drop brace goes actually had worn through and his foot was actually rubbing on the bottom of the shoe on the brace itself. Subsequently this ended up with a wound and he in turn had a lot of issues following. Fortunately there is no signs of active infection at this  time which is good news in my opinion. With that being said the wound does appear to be somewhat moist with some evidence of maceration unfortunately. He has no pain he does have neuropathy. He also has bilateral foot drop which she wears braces. This does appear to be however a pressure injury secondary to the AFO brace. The patient has been seen by podiatry since May 2021 unfortunately this is not healing hence the reason that he is coming to see [place] at this point to see if there is anything we can do to help in this regard. 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 9/15; the patient missed his appointment last week due to illness with his wife who had to be hospitalized. We are using Hydrofera Blue to the small punched out area in the middle of the left plantar heel. 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 This is felt to be pressure from the brace itself. 03/05/2020 upon evaluation today patient appears to be doing well in regard to his wound on the heel. He has been tolerating the dressing changes without complication which is great news. Fortunately there is no signs of active infection which I am very happy with. Objective Constitutional Well-nourished and well-hydrated in no acute distress. Vitals Time Taken: 2:05 AM, Height: 72 in, Weight: 200 lbs, BMI: 27.1, Temperature: 97.9 F, Pulse: 83 bpm, Respiratory Rate: 22 breaths/min, Blood Pressure: 129/66 mmHg. Respiratory normal breathing without difficulty. Psychiatric this patient is able to make decisions and demonstrates good insight into disease process. Alert and Oriented x 3. pleasant and cooperative. General Notes: Upon inspection patient's wound bed actually showed signs of good granulation at this time. There was some callus around the edges of the wound which did require sharp debridement to clear this away  today. Post debridement wound bed appears to be doing somewhat better. Integumentary (Hair, Skin) Wound #1 status is Open. Original cause of wound was Trauma. The wound is located on the Left,Plantar Calcaneus. The wound measures 0.4cm length x 0.4cm width x 0.9cm depth; 0.126cm^2 area and 0.113cm^3 volume. There is Fat Layer (Subcutaneous Tissue) exposed. There is a large amount of sanguinous drainage noted. The wound margin is flat and intact. There is medium (34-66%) pink granulation within the wound bed. There is a medium (34-66%) amount of necrotic tissue within the wound  bed including Adherent Slough. Assessment Mendia, Brady ([id number]) Active Problems ICD-10 Pressure ulcer of left heel, stage 3 Idiopathic progressive neuropathy Foot drop, left foot Foot drop, right foot Procedures Wound #1 Pre-procedure diagnosis of Wound #1 is a Trauma, Other located on the Left,Plantar Calcaneus . There was a Selective/Open Wound Non-Viable Tissue Debridement with a total area of 0.16 sq cm performed by STONE III, Masaki Rothbauer E., PA-C. With the following instrument(s): Curette to remove Non-Viable tissue/material. Material removed includes Callus after achieving pain control using Lidocaine. No specimens were taken. A time out was conducted at 14:46, prior to the start of the procedure. There was no bleeding. The procedure was tolerated well with a pain level of 0 throughout and a pain level of 0 following the procedure. Post Debridement Measurements: 0.4cm length x 0.4cm width x 1cm depth; 0.126cm^3 volume. Character of Wound/Ulcer Post Debridement is improved. Post procedure Diagnosis Wound #1: Same as Pre-Procedure Plan Wound Cleansing: Wound #1 Left,Plantar Calcaneus: Dial antibacterial soap, wash wounds, rinse and pat dry prior to dressing wounds Primary Wound Dressing: Wound #1 Left,Plantar Calcaneus: Hydrafera Blue Ready Transfer - cut to fit into wound bed Secondary Dressing: Wound #1  Left,Plantar Calcaneus: Boardered Foam Dressing - Heel Cup, stretchnet Dressing Change Frequency: Wound #1 Left,Plantar Calcaneus: Change dressing every other day. Follow-up Appointments: Wound #1 Left,Plantar Calcaneus: Return Appointment in 2 weeks. 1. I suggest currently that we continue with the [facility] he seems to be doing well with this. 2. I am also can recommend that the patient continue to monitor for any signs of worsening infection. Obviously the goal here is to have this Holt from the bottom outward. That is the reason for packing the Hydrofera Blue foam down into the base of the wound. Subsequently I also do think that the bordered foam dressing to cover is helpful. He is using a heel cup over top just to try to help with cushioning. We will see patient back for reevaluation in 2 weeks here in the clinic. If anything worsens or changes patient will contact our office for additional recommendations. Electronic Signature(s) Signed: 03/05/2020 4:33:34 PM By: Worthy Keeler PA-C Entered By: Worthy Keeler on 03/05/2020 16:33:34 [place], [name] ([id number]) -------------------------------------------------------------------------------- SuperBill Details Patient Name: [name] Date of Service: 03/05/2020 Medical Record Number: [id number] Patient Account Number: [id number] Date of Birth/Sex: 1935-09-07 (84 y.o. 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 Foot drop, left foot M21.371 Foot drop, right foot Facility Procedures CPT4 Code: [id number] Description: 80165 - DEBRIDE WOUND 1ST 20 SQ CM OR < Modifier: Quantity: 1 CPT4 Code: Description: ICD-10 Diagnosis Description  L89.623 Pressure ulcer of left heel, stage 3 Modifier: Quantity: Physician Procedures CPT4 Code: [id number] Description: 97597 - WC PHYS DEBR WO ANESTH 20 SQ CM Modifier: Quantity: 1 CPT4 Code: Description: ICD-10 Diagnosis Description L89.623 Pressure ulcer of left heel, stage 3 Modifier: Quantity: Electronic Signature(s) Signed: 03/05/2020 4:33:42 PM By: Worthy Keeler PA-C Entered By: Worthy Keeler on 03/05/2020 16:33:42

## 2020-03-06 NOTE — Progress Notes (Signed)
[name], [name] ([id number]) Visit Report for 03/05/2020 Arrival Information Details Patient Name: [name], [name] Date of Service: 03/05/2020 2:15 PM Medical Record Number: [id number] Patient Account Number: [id number] Date of Birth/Sex: [birth year] (84 y.o. M) Treating RN: Grover Canavan Primary Care Sahily Biddle: [name] Other Clinician: Referring Temprance Wyre: [name] Treating Sharvil Hoey/Extender: Melburn Hake, HOYT Weeks in Treatment: 3 Visit Information History Since Last Visit All ordered tests and consults were completed: No Patient Arrived: [name] Added or deleted any medications: No Arrival Time: 14:29 Any new allergies or adverse reactions: No Accompanied By: self Had a fall or experienced change in No Transfer Assistance: None activities of daily living that may affect Patient Identification Verified: Yes risk of falls: Secondary Verification Process Completed: Yes Signs or symptoms of abuse/neglect since last visito No Patient Requires Transmission-Based Precautions: No Hospitalized since last visit: No Patient Has Alerts: No Implantable device outside of the clinic excluding No cellular tissue based products placed in the center since last visit: Pain Present Now: No Electronic Signature(s) Signed: 03/05/2020 5:16:53 PM By: Lorine Bears RCP, RRT, CHT Entered By: Lorine Bears on 03/05/2020 16:48:03 [name] ([id number]) -------------------------------------------------------------------------------- Encounter Discharge Information Details Patient Name: [name] Date of Service: 03/05/2020 2:15 PM Medical Record Number: [id number] Patient Account Number: [id number] Date of Birth/Sex: 20-Dec-1935 (84 y.o. M) Treating RN: Grover Canavan Primary Care Dravyn Severs: [name] Other Clinician: Referring Vint Pola: [name] Treating Swathi Dauphin/Extender: Sharalyn Ink in Treatment: 3 Encounter Discharge  Information Items Discharge Condition: Stable Ambulatory Status: Ambulatory Discharge Destination: Home Transportation: Private Auto Accompanied By: self Schedule Follow-up Appointment: Yes Clinical Summary of Care: Electronic Signature(s) Signed: 03/05/2020 5:09:50 PM By: Grover Canavan Entered By: Grover Canavan on 03/05/2020 14:47:27 [name], [name] ([id number]) -------------------------------------------------------------------------------- Lower Extremity Assessment Details Patient Name: [name] Date of Service: 03/05/2020 2:15 PM Medical Record Number: [id number] Patient Account Number: [id number] Date of Birth/Sex: December 31, 1935 (84 y.o. M) Treating RN: Grover Canavan Primary Care Xariah Silvernail: [name] Other Clinician: Referring Dayami Taitt: [name] Treating Nishka Heide/Extender: Melburn Hake, HOYT Weeks in Treatment: 3 Edema Assessment Assessed: [Left: Yes] [Right: No] Edema: [Left: Ye] [Right: s] Calf Left: Right: Point of Measurement: 33 cm From Medial Instep 39 cm cm Ankle Left: Right: Point of Measurement: 13 cm From Medial Instep 26.5 cm cm Vascular Assessment Pulses: Dorsalis Pedis Palpable: [Left:Yes] Posterior Tibial Palpable: [Left:Yes] Electronic Signature(s) Signed: 03/05/2020 5:09:50 PM By: Grover Canavan Signed: 03/05/2020 5:16:53 PM By: Lorine Bears RCP, RRT, CHT Entered By: Lorine Bears on 03/05/2020 16:48:36 [name], [name] ([id number]) -------------------------------------------------------------------------------- Multi Wound Chart Details Patient Name: [name] Date of Service: 03/05/2020 2:15 PM Medical Record Number: [id number] Patient Account Number: [id number] Date of Birth/Sex: [birth year] (84 y.o. M) Treating RN: Grover Canavan Primary Care Dayan Desa: [name] Other Clinician: Referring Eulanda Dorion: [name] Treating Jamontae Thwaites/Extender: Melburn Hake, HOYT Weeks in  Treatment: 3 Vital Signs Height(in): 72 Pulse(bpm): 83 Weight(lbs): 200 Blood Pressure(mmHg): 129/66 Body Mass Index(BMI): 27 Temperature(F): 97.9 Respiratory Rate(breaths/min): 22 Photos: [N/A:N/A] Wound Location: Left, Plantar Calcaneus N/A N/A Wounding Event: Trauma N/A N/A Primary Etiology: Trauma, Other N/A N/A Secondary Etiology: Pressure Ulcer N/A N/A Comorbid History: Hypertension, Myocardial Infarction, N/A N/A Osteoarthritis, Neuropathy Date Acquired: 10/21/2019 N/A N/A Weeks of Treatment: 3 N/A N/A Wound Status: Open N/A N/A Measurements L x W x D (cm) 0.4x0.4x0.9 N/A N/A Area (cm) : 0.126 N/A N/A Volume (cm) : 0.113 N/A N/A % Reduction in Area: 35.70% N/A N/A % Reduction in Volume: -43.00% N/A N/A Classification: Full Thickness Without Exposed N/A  N/A Support Structures Exudate Amount: Large N/A N/A Exudate Type: Sanguinous N/A N/A Exudate Color: red N/A N/A Wound Margin: Flat and Intact N/A N/A Granulation Amount: Medium (34-66%) N/A N/A Granulation Quality: Pink N/A N/A Necrotic Amount: Medium (34-66%) N/A N/A Exposed Structures: Fat Layer (Subcutaneous Tissue): N/A N/A Yes Fascia: No Tendon: No Muscle: No Joint: No Bone: No Epithelialization: None N/A N/A Treatment Notes Electronic Signature(s) Signed: 03/05/2020 5:09:50 PM By: Grover Canavan Entered By: Grover Canavan on 03/05/2020 14:45:37 [name] ([id number]) -------------------------------------------------------------------------------- Multi-Disciplinary Care Plan Details Patient Name: [name] Date of Service: 03/05/2020 2:15 PM Medical Record Number: [id number] Patient Account Number: [id number] Date of Birth/Sex: 1935/12/13 (84 y.o. M) Treating RN: Grover Canavan Primary Care Mardy Lucier: [name] Other Clinician: Referring Loula Marcella: [name] Treating Ervey Fallin/Extender: Melburn Hake, HOYT Weeks in Treatment: 3 Active Inactive Orientation to the Wound  Care Program Nursing Diagnoses: Knowledge deficit related to the wound healing center program Goals: Patient/caregiver will verbalize understanding of the [facility] Date Initiated: 02/11/2020 Target Resolution Date: 02/15/2020 Goal Status: Active Interventions: Provide education on orientation to the wound center Notes: Wound/Skin Impairment Nursing Diagnoses: Impaired tissue integrity Knowledge deficit related to ulceration/compromised skin integrity Goals: Patient/caregiver will verbalize understanding of skin care regimen Date Initiated: 02/11/2020 Target Resolution Date: 02/24/2020 Goal Status: Active Interventions: Assess patient/caregiver ability to obtain necessary supplies Assess patient/caregiver ability to perform ulcer/skin care regimen upon admission and as needed Assess ulceration(s) every visit Provide education on ulcer and skin care Treatment Activities: Skin care regimen initiated : 02/11/2020 Notes: Electronic Signature(s) Signed: 03/05/2020 5:09:50 PM By: Grover Canavan Entered By: Grover Canavan on 03/05/2020 14:45:29 [name], [name] ([id number]) -------------------------------------------------------------------------------- Pain Assessment Details Patient Name: [name] Date of Service: 03/05/2020 2:15 PM Medical Record Number: [id number] Patient Account Number: [id number] Date of Birth/Sex: 1935/12/19 (84 y.o. M) Treating RN: Grover Canavan Primary Care Jasmond River: [name] Other Clinician: Referring Eugen Jeansonne: [name] Treating Jakiah Bienaime/Extender: Melburn Hake, HOYT Weeks in Treatment: 3 Active Problems Location of Pain Severity and Description of Pain Patient Has Paino No Site Locations With Dressing Change: No Pain Management and Medication Current Pain Management: Electronic Signature(s) Signed: 03/05/2020 5:09:50 PM By: Grover Canavan Signed: 03/05/2020 5:16:53 PM By: Lorine Bears RCP, RRT,  CHT Entered By: Lorine Bears on 03/05/2020 16:48:18 [name] ([id number]) -------------------------------------------------------------------------------- Patient/Caregiver Education Details Patient Name: [name] Date of Service: 03/05/2020 2:15 PM Medical Record Number: [id number] Patient Account Number: [id number] Date of Birth/Gender: 08-Oct-1935 (84 y.o. M) Treating RN: Grover Canavan Primary Care Physician: [name] Other Clinician: Referring Physician: [name] Treating Physician/Extender: Sharalyn Ink in Treatment: 3 Education Assessment Education Provided To: Patient Education Topics Provided Wound/Skin Impairment: Methods: Explain/Verbal Responses: State content correctly Electronic Signature(s) Signed: 03/05/2020 5:09:50 PM By: Grover Canavan Entered By: Grover Canavan on 03/05/2020 14:45:57 [name], [name] ([id number]) -------------------------------------------------------------------------------- Wound Assessment Details Patient Name: [name] Date of Service: 03/05/2020 2:15 PM Medical Record Number: [id number] Patient Account Number: [id number] Date of Birth/Sex: 17-Apr-1936 (84 y.o. M) Treating RN: Grover Canavan Primary Care Standley Bargo: [name] Other Clinician: Referring Aneth Schlagel: [name] Treating Francesca Strome/Extender: Melburn Hake, HOYT Weeks in Treatment: 3 Wound Status Wound Number: 1 Primary Etiology: Trauma, Other Wound Location: Left, Plantar Calcaneus Secondary Pressure Ulcer Etiology: Wounding Event: Trauma Wound Status: Open Date Acquired: 10/21/2019 Comorbid Hypertension, Myocardial Infarction, Osteoarthritis, Weeks Of Treatment: 3 History: Neuropathy Clustered Wound: No Photos Wound Measurements Length: (cm) 0.4 Width: (cm) 0.4 Depth: (cm) 0.9 Area: (cm) 0.126 Volume: (cm) 0.113 % Reduction in  Area: 35.7% % Reduction in Volume: -43% Epithelialization:  None Wound Description Classification: Full Thickness Without Exposed Support Structu Wound Margin: Flat and Intact Exudate Amount: Large Exudate Type: Sanguinous Exudate Color: red res Foul Odor After Cleansing: No Slough/Fibrino No Wound Bed Granulation Amount: Medium (34-66%) Exposed Structure Granulation Quality: Pink Fascia Exposed: No Necrotic Amount: Medium (34-66%) Fat Layer (Subcutaneous Tissue) Exposed: Yes Necrotic Quality: Adherent Slough Tendon Exposed: No Muscle Exposed: No Joint Exposed: No Bone Exposed: No Treatment Notes Wound #1 (Left, Plantar Calcaneus) Notes Hblue, BFD Electronic Signature(s) Signed: 03/05/2020 5:09:50 PM By: Aretha Parrot ([id number]) Signed: 03/05/2020 5:16:53 PM By: Lorine Bears RCP, RRT, CHT Entered By: Lorine Bears on 03/05/2020 16:49:04 [name] ([id number]) -------------------------------------------------------------------------------- Vitals Details Patient Name: [name] Date of Service: 03/05/2020 2:15 PM Medical Record Number: [id number] Patient Account Number: [id number] Date of Birth/Sex: 06-02-1936 (84 y.o. M) Treating RN: Grover Canavan Primary Care Erminia Mcnew: [name] Other Clinician: Referring Bonnie Roig: [name] Treating Jaelon Gatley/Extender: Melburn Hake, HOYT Weeks in Treatment: 3 Vital Signs Time Taken: 02:05 Temperature (F): 97.9 Height (in): 72 Pulse (bpm): 83 Weight (lbs): 200 Respiratory Rate (breaths/min): 22 Body Mass Index (BMI): 27.1 Blood Pressure (mmHg): 129/66 Reference Range: 80 - 120 mg / dl Electronic Signature(s) Signed: 03/05/2020 5:16:53 PM By: Lorine Bears RCP, RRT, CHT Entered By: Becky Sax, Amado Nash on 03/05/2020 16:48:09

## 2020-03-11 DIAGNOSIS — I739 Peripheral vascular disease, unspecified: Secondary | ICD-10-CM | POA: Diagnosis not present

## 2020-03-11 DIAGNOSIS — Z89412 Acquired absence of left great toe: Secondary | ICD-10-CM | POA: Diagnosis not present

## 2020-03-17 ENCOUNTER — Ambulatory Visit: Payer: Medicare Other

## 2020-03-17 DIAGNOSIS — E782 Mixed hyperlipidemia: Secondary | ICD-10-CM | POA: Diagnosis not present

## 2020-03-17 DIAGNOSIS — I10 Essential (primary) hypertension: Secondary | ICD-10-CM | POA: Diagnosis not present

## 2020-03-17 DIAGNOSIS — I2510 Atherosclerotic heart disease of native coronary artery without angina pectoris: Secondary | ICD-10-CM | POA: Diagnosis not present

## 2020-03-18 ENCOUNTER — Ambulatory Visit (INDEPENDENT_AMBULATORY_CARE_PROVIDER_SITE_OTHER): Payer: Medicare Other

## 2020-03-18 ENCOUNTER — Other Ambulatory Visit: Payer: Self-pay

## 2020-03-18 DIAGNOSIS — Z23 Encounter for immunization: Secondary | ICD-10-CM

## 2020-03-19 ENCOUNTER — Other Ambulatory Visit
Admission: RE | Admit: 2020-03-19 | Discharge: 2020-03-19 | Disposition: A | Discharge status: Home / Self Care | Payer: Medicare Other | Source: Ambulatory Visit | Attending: Internal Medicine | Admitting: Internal Medicine

## 2020-03-19 ENCOUNTER — Encounter: Payer: Medicare Other | Attending: Physician Assistant | Admitting: Physician Assistant

## 2020-03-19 DIAGNOSIS — G603 Idiopathic progressive neuropathy: Secondary | ICD-10-CM | POA: Diagnosis not present

## 2020-03-19 DIAGNOSIS — M21371 Foot drop, right foot: Secondary | ICD-10-CM | POA: Insufficient documentation

## 2020-03-19 DIAGNOSIS — L89623 Pressure ulcer of left heel, stage 3: Secondary | ICD-10-CM | POA: Insufficient documentation

## 2020-03-19 DIAGNOSIS — Z888 Allergy status to other drugs, medicaments and biological substances status: Secondary | ICD-10-CM | POA: Insufficient documentation

## 2020-03-19 DIAGNOSIS — M21372 Foot drop, left foot: Secondary | ICD-10-CM | POA: Diagnosis not present

## 2020-03-19 DIAGNOSIS — B353 Tinea pedis: Secondary | ICD-10-CM | POA: Insufficient documentation

## 2020-03-19 DIAGNOSIS — Z91040 Latex allergy status: Secondary | ICD-10-CM | POA: Insufficient documentation

## 2020-03-19 DIAGNOSIS — Z885 Allergy status to narcotic agent status: Secondary | ICD-10-CM | POA: Insufficient documentation

## 2020-03-19 NOTE — Progress Notes (Signed)
AMANDO, CHAPUT ([id number]) Visit Report for 03/19/2020 Arrival Information Details Patient Name: BARUCH, LEWERS Date of Service: 03/19/2020 2:15 PM Medical Record Number: [id number] Patient Account Number: [id number] Date of Birth/Sex: 01-Jan-1936 (84 y.o. M) Treating RN: Darci Needle Primary Care Fonda Rochon: [name] Other Clinician: Referring Jesenya Bowditch: [name] Treating Nehan Flaum/Extender: Melburn Hake, HOYT Weeks in Treatment: 5 Visit Information History Since Last Visit All ordered tests and consults were completed: No Patient Arrived: [name] Added or deleted any medications: No Arrival Time: 14:27 Any new allergies or adverse reactions: No Accompanied By: self Had a fall or experienced change in Yes Transfer Assistance: None activities of daily living that may affect Patient Identification Verified: Yes risk of falls: Secondary Verification Process Completed: Yes Signs or symptoms of abuse/neglect since last visito No Patient Requires Transmission-Based Precautions: No Hospitalized since last visit: No Patient Has Alerts: No Implantable device outside of the clinic excluding No cellular tissue based products placed in the center since last visit: Has Dressing in Place as Prescribed: Yes Has Footwear/Offloading in Place as Prescribed: Yes Left: Other:brace Pain Present Now: No Notes patient states he fell on Monday 10/4 at 3am going to bathroom. Abrasion to right knee. EMS called to assist getting up. No hospitalization needed. Electronic Signature(s) Signed: 03/19/2020 3:58:02 PM By: Darci Needle Entered By: Darci Needle on 03/19/2020 14:32:24 [name] ([id number]) -------------------------------------------------------------------------------- Encounter Discharge Information Details Patient Name: [name] Date of Service: 03/19/2020 2:15 PM Medical Record Number: [id number] Patient Account Number: [id number] Date of Birth/Sex: [birth year] (84  y.o. M) Treating RN: Cornell Barman Primary Care Deklynn Charlet: [name] Other Clinician: Referring Murielle Stang: [name] Treating Krishawna Stiefel/Extender: Melburn Hake, HOYT Weeks in Treatment: 5 Encounter Discharge Information Items Post Procedure Vitals Discharge Condition: Stable Unable to obtain vitals Reason: patient preference Ambulatory Status: Walker Discharge Destination: Home Transportation: Private Auto Accompanied By: self Schedule Follow-up Appointment: Yes Clinical Summary of Care: Electronic Signature(s) Signed: 03/19/2020 5:05:57 PM By: Gretta Cool, BSN, RN, CWS, Kim RN, BSN Entered By: Gretta Cool, BSN, RN, CWS, Kim on 03/19/2020 15:16:25 [name] ([id number]) -------------------------------------------------------------------------------- Lower Extremity Assessment Details Patient Name: [name] Date of Service: 03/19/2020 2:15 PM Medical Record Number: [id number] Patient Account Number: [id number] Date of Birth/Sex: 1935-09-01 (84 y.o. M) Treating RN: Darci Needle Primary Care Thorsten Climer: [name] Other Clinician: Referring Ketrick Matney: [name] Treating Jera Headings/Extender: Melburn Hake, HOYT Weeks in Treatment: 5 Edema Assessment Assessed: [Left: Yes] [Right: No] Edema: [Left: Ye] [Right: s] Calf Left: Right: Point of Measurement: 33 cm From Medial Instep 38.2 cm Ankle Left: Right: Point of Measurement: 13 cm From Medial Instep 25.5 cm Vascular Assessment Pulses: Dorsalis Pedis Palpable: [Left:Yes] Posterior Tibial Palpable: [Left:Yes] Electronic Signature(s) Signed: 03/19/2020 3:58:02 PM By: Darci Needle Entered By: Darci Needle on 03/19/2020 14:31:04 Raya, [name] ([id number]) -------------------------------------------------------------------------------- Multi Wound Chart Details Patient Name: [name] Date of Service: 03/19/2020 2:15 PM Medical Record Number: [id number] Patient Account Number: [id number] Date of  Birth/Sex: 1935/09/06 (84 y.o. M) Treating RN: Cornell Barman Primary Care Wasif Simonich: [name] Other Clinician: Referring Samy Ryner: [name] Treating Desmond Szabo/Extender: Melburn Hake, HOYT Weeks in Treatment: 5 Vital Signs Height(in): 72 Pulse(bpm): 108 Weight(lbs): 200 Blood Pressure(mmHg): 137/69 Body Mass Index(BMI): 27 Temperature(F): 97.6 Respiratory Rate(breaths/min): 22 Photos: [N/A:N/A] Wound Location: Left, Plantar Calcaneus N/A N/A Wounding Event: Trauma N/A N/A Primary Etiology: Trauma, Other N/A N/A Secondary Etiology: Pressure Ulcer N/A N/A Comorbid History: Hypertension, Myocardial Infarction, N/A N/A Osteoarthritis, Neuropathy Date Acquired: 10/21/2019 N/A N/A Weeks of Treatment: 5 N/A N/A Wound Status: Open N/A N/A Measurements  L x W x D (cm) 0.4x0.4x0.8 N/A N/A Area (cm) : 0.126 N/A N/A Volume (cm) : 0.101 N/A N/A % Reduction in Area: 35.70% N/A N/A % Reduction in Volume: -27.80% N/A N/A Classification: Full Thickness Without Exposed N/A N/A Support Structures Exudate Amount: Large N/A N/A Exudate Type: Sanguinous N/A N/A Exudate Color: red N/A N/A Wound Margin: Flat and Intact N/A N/A Granulation Amount: Medium (34-66%) N/A N/A Granulation Quality: Pink N/A N/A Necrotic Amount: Medium (34-66%) N/A N/A Exposed Structures: Fat Layer (Subcutaneous Tissue): N/A N/A Yes Fascia: No Tendon: No Muscle: No Joint: No Bone: No Epithelialization: None N/A N/A Treatment Notes Electronic Signature(s) Signed: 03/19/2020 5:05:57 PM By: Gretta Cool, BSN, RN, CWS, Kim RN, BSN Entered By: Gretta Cool, BSN, RN, CWS, Kim on 03/19/2020 14:55:02 [name] ([id number]) -------------------------------------------------------------------------------- Multi-Disciplinary Care Plan Details Patient Name: [name] Date of Service: 03/19/2020 2:15 PM Medical Record Number: [id number] Patient Account Number: [id number] Date of Birth/Sex: 10/15/1935 (84 y.o.  M) Treating RN: Cornell Barman Primary Care Hatice Bubel: [name] Other Clinician: Referring Shawnmichael Parenteau: [name] Treating Elysia Grand/Extender: Melburn Hake, HOYT Weeks in Treatment: 5 Active Inactive Orientation to the Wound Care Program Nursing Diagnoses: Knowledge deficit related to the wound healing center program Goals: Patient/caregiver will verbalize understanding of the [facility] Program Date Initiated: 02/11/2020 Target Resolution Date: 02/15/2020 Goal Status: Active Interventions: Provide education on orientation to the wound center Notes: Wound/Skin Impairment Nursing Diagnoses: Impaired tissue integrity Knowledge deficit related to ulceration/compromised skin integrity Goals: Patient/caregiver will verbalize understanding of skin care regimen Date Initiated: 02/11/2020 Target Resolution Date: 02/24/2020 Goal Status: Active Interventions: Assess patient/caregiver ability to obtain necessary supplies Assess patient/caregiver ability to perform ulcer/skin care regimen upon admission and as needed Assess ulceration(s) every visit Provide education on ulcer and skin care Treatment Activities: Skin care regimen initiated : 02/11/2020 Notes: Electronic Signature(s) Signed: 03/19/2020 5:05:57 PM By: Gretta Cool, BSN, RN, CWS, Kim RN, BSN Entered By: Gretta Cool, BSN, RN, CWS, Kim on 03/19/2020 14:54:25 [name] ([id number]) -------------------------------------------------------------------------------- Pain Assessment Details Patient Name: [name] Date of Service: 03/19/2020 2:15 PM Medical Record Number: [id number] Patient Account Number: [id number] Date of Birth/Sex: [birth year] (84 y.o. M) Treating RN: Darci Needle Primary Care Crue Otero: [name] Other Clinician: Referring Wilson Dusenbery: [name] Treating Mee Macdonnell/Extender: Melburn Hake, HOYT Weeks in Treatment: 5 Active Problems Location of Pain Severity and Description of  Pain Patient Has Paino No Site Locations With Dressing Change: No Pain Management and Medication Current Pain Management: Electronic Signature(s) Signed: 03/19/2020 3:58:02 PM By: Darci Needle Entered By: Darci Needle on 03/19/2020 14:29:15 Espina, [name] ([id number]) -------------------------------------------------------------------------------- Wound Assessment Details Patient Name: [name] Date of Service: 03/19/2020 2:15 PM Medical Record Number: [id number] Patient Account Number: [id number] Date of Birth/Sex: 06/18/1935 (84 y.o. M) Treating RN: Darci Needle Primary Care Nasirah Sachs: [name] Other Clinician: Referring Sherene Plancarte: [name] Treating Lisbet Busker/Extender: Melburn Hake, HOYT Weeks in Treatment: 5 Wound Status Wound Number: 1 Primary Etiology: Trauma, Other Wound Location: Left, Plantar Calcaneus Secondary Pressure Ulcer Etiology: Wounding Event: Trauma Wound Status: Open Date Acquired: 10/21/2019 Comorbid Hypertension, Myocardial Infarction, Osteoarthritis, Weeks Of Treatment: 5 History: Neuropathy Clustered Wound: No Photos Wound Measurements Length: (cm) 0.4 Width: (cm) 0.4 Depth: (cm) 0.8 Area: (cm) 0.126 Volume: (cm) 0.101 % Reduction in Area: 35.7% % Reduction in Volume: -27.8% Epithelialization: None Wound Description Classification: Full Thickness Without Exposed Support Structu Wound Margin: Flat and Intact Exudate Amount: Large Exudate Type: Sanguinous Exudate Color: red res Foul Odor After Cleansing: No Slough/Fibrino No Wound Bed Granulation Amount:  Medium (34-66%) Exposed Structure Granulation Quality: Pink Fascia Exposed: No Necrotic Amount: Medium (34-66%) Fat Layer (Subcutaneous Tissue) Exposed: Yes Necrotic Quality: Adherent Slough Tendon Exposed: No Muscle Exposed: No Joint Exposed: No Bone Exposed: No Treatment Notes Wound #1 (Left, Plantar Calcaneus) Notes S Cell, gauze, heel cup secured with  stretch net Electronic Signature(s) Signed: 03/19/2020 3:58:02 PM By: Randol Kern, [name] ([id number]) Entered By: Darci Needle on 03/19/2020 14:30:28 [name] ([id number]) -------------------------------------------------------------------------------- Vitals Details Patient Name: [name] Date of Service: 03/19/2020 2:15 PM Medical Record Number: [id number] Patient Account Number: [id number] Date of Birth/Sex: June 10, 1936 (84 y.o. M) Treating RN: Darci Needle Primary Care Lundy Cozart: [name] Other Clinician: Referring Altagracia Rone: [name] Treating Calle Schader/Extender: Melburn Hake, HOYT Weeks in Treatment: 5 Vital Signs Time Taken: 02:15 Temperature (F): 97.6 Height (in): 72 Pulse (bpm): 108 Weight (lbs): 200 Respiratory Rate (breaths/min): 22 Body Mass Index (BMI): 27.1 Blood Pressure (mmHg): 137/69 Reference Range: 80 - 120 mg / dl Electronic Signature(s) Signed: 03/19/2020 3:58:02 PM By: Darci Needle Entered By: Darci Needle on 03/19/2020 14:28:58

## 2020-03-19 NOTE — Progress Notes (Addendum)
CURT, OATIS ([id number]) Visit Report for 03/19/2020 Chief Complaint Document Details Patient Name: [name], [name] Date of Service: 03/19/2020 2:15 PM Medical Record Number: [id number] Patient Account Number: [id number] Date of Birth/Sex: 1936-01-30 (84 y.o. M) Treating RN: Cornell Barman Primary Care Provider: [name] Other Clinician: Referring Provider: [name] Treating Provider/Extender: Melburn Hake, Kazuto Sevey Weeks in Treatment: 5 Information Obtained from: Patient Chief Complaint Pressure ulcer left heel Electronic Signature(s) Signed: 03/19/2020 2:14:04 PM By: Worthy Keeler PA-C Entered By: Worthy Keeler on 03/19/2020 14:14:04 Burkland, [name] ([id number]) -------------------------------------------------------------------------------- Debridement Details Patient Name: [name] Date of Service: 03/19/2020 2:15 PM Medical Record Number: [id number] Patient Account Number: [id number] Date of Birth/Sex: 1935-07-09 (84 y.o. M) Treating RN: Cornell Barman Primary Care Provider: [name] Other Clinician: Referring Provider: [name] Treating Provider/Extender: Melburn Hake, Breckin Zafar Weeks in Treatment: 5 Debridement Performed for Wound #1 Left,Plantar Calcaneus Assessment: Performed By: Physician STONE III, Jaysie Benthall E., PA-C Debridement Type: Debridement Level of Consciousness (Pre- Awake and Alert procedure): Pre-procedure Verification/Time Out Yes - 14:55 Taken: Total Area Debrided (L x W): 0.5 (cm) x 0.4 (cm) = 0.2 (cm) Tissue and other material Viable, Non-Viable, Callus, Slough, Subcutaneous, Slough debrided: Level: Skin/Subcutaneous Tissue Debridement Description: Excisional Instrument: Curette Specimen: Swab, Number of Specimens Taken: 1 Bleeding: Moderate Hemostasis Achieved: Pressure Response to Treatment: Procedure was tolerated well Level of Consciousness (Post- Awake and Alert procedure): Post Debridement Measurements of Total  Wound Length: (cm) 0.5 Width: (cm) 0.4 Depth: (cm) 0.8 Volume: (cm) 0.126 Character of Wound/Ulcer Post Debridement: Stable Post Procedure Diagnosis Same as Pre-procedure Electronic Signature(s) Signed: 03/19/2020 5:05:57 PM By: Gretta Cool, BSN, RN, CWS, Kim RN, BSN Signed: 03/19/2020 5:23:32 PM By: Worthy Keeler PA-C Entered By: Gretta Cool, BSN, RN, CWS, Kim on 03/19/2020 14:56:35 Birdsell, [name] ([id number]) -------------------------------------------------------------------------------- HPI Details Patient Name: [name] Date of Service: 03/19/2020 2:15 PM Medical Record Number: [id number] Patient Account Number: [id number] Date of Birth/Sex: Mar 18, 1936 (84 y.o. M) Treating RN: Cornell Barman Primary Care Provider: [name] Other Clinician: Referring Provider: [name] Treating Provider/Extender: Melburn Hake, Charnetta Wulff Weeks in Treatment: 5 History of Present Illness HPI Description: 02/11/2020 on evaluation today patient appears to be doing somewhat poorly on initial evaluation concerning his heel. He has been using a topical antibiotic ointment which does not seem to really have been doing the best job for him. He tells me that he had a wound that occurred as a result of his padding in the bottom of his shoe wear his foot drop brace goes actually had worn through and his foot was actually rubbing on the bottom of the shoe on the brace itself. Subsequently this ended up with a wound and he in turn had a lot of issues following. Fortunately there is no signs of active infection at this time which is good news in my opinion. With that being said the wound does appear to be somewhat moist with some evidence of maceration unfortunately. He has no pain he does have neuropathy. He also has bilateral foot drop which she wears braces. This does appear to be however a pressure injury secondary to the AFO brace. The patient has been seen by podiatry since May 2021 unfortunately this is not  healing hence the reason that he is coming to see [place] at this point to see if there is anything we can do to help in this regard. 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 9/15; the patient missed his appointment last week due to illness with his wife who had to be hospitalized. We are using Hydrofera Blue to the small punched out area in the middle of the left plantar heel. 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 This is felt to be pressure from the brace itself. 03/05/2020 upon evaluation today patient appears to be doing well in regard to his wound on the heel. He has been tolerating the dressing changes without complication which is great news. Fortunately there is no signs of active infection which I am very happy with. 03/19/2020 upon evaluation today patient appears to be doing a little worse in regard to his heel based on what I am seeing at this point. There actually appears to be a pocket off to the side laterally that is problematic for him at this point. I am not to clear this way with some sharp debridement currently. The patient was in agreement with that plan. Electronic Signature(s) Signed: 03/19/2020 4:24:16 PM By: Worthy Keeler PA-C Entered By: Worthy Keeler on 03/19/2020 16:24:16 Piechocki, [name] ([id number]) -------------------------------------------------------------------------------- Physical Exam Details Patient Name: [name] Date of Service: 03/19/2020 2:15 PM Medical Record Number: [id number] Patient Account Number: [id number] Date of Birth/Sex: [birth year] (84 y.o. M) Treating RN: Cornell Barman Primary Care Provider: [name] Other Clinician: Referring Provider: [name] Treating Provider/Extender: Melburn Hake, Lagina Reader Weeks in Treatment: 5 Constitutional Well-nourished and well-hydrated in no acute distress. Respiratory normal  breathing without difficulty. Psychiatric this patient is able to make decisions and demonstrates good insight into disease process. Alert and Oriented x 3. pleasant and cooperative. Notes Upon inspection patient's wound bed actually showed signs of good granulation at this time in the base of the wound but unfortunately he does have a lot of callus around and there is some blue/green drainage as well noted which has me concerned about the possibility of Pseudomonas. A wound culture was obtained we will see what that shows and then subsequently were also good to see what we can do about going ahead initiating treatment with Levaquin for him as he feels like Cipro caused some trouble in the past nothing else allergy like but just he did not tolerate it well and hoping Levaquin would be better. Electronic Signature(s) Signed: 03/19/2020 4:24:56 PM By: Worthy Keeler PA-C Previous Signature: 03/19/2020 4:24:38 PM Version By: Worthy Keeler PA-C Entered By: Worthy Keeler on 03/19/2020 16:24:55 Blowers, [name] ([id number]) -------------------------------------------------------------------------------- Physician Orders Details Patient Name: [name] Date of Service: 03/19/2020 2:15 PM Medical Record Number: [id number] Patient Account Number: [id number] Date of Birth/Sex: Feb 26, 1936 (84 y.o. 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 Follow-up Appointments Wound #1 Left,Plantar Calcaneus o Return Appointment in 1 week. - Nurse visit to ensure no worsening of the wound - Call [facility] if any complications o  Return Appointment in 2 weeks. - Provider visit Off-Loading Wound #1 Left,Plantar Calcaneus o Other: - keep pressure off of heel Laboratory o Bacteria identified in Wound by Culture (MICRO) - Left heel culture obtained oooo LOINC Code: 6462-6 oooo Convenience Name: Wound culture routine Patient Medications Allergies: codeine, mussels, latex, Indocin Notifications Medication Indication Start End Levaquin 03/19/2020 DOSE 1 - oral 500 mg tablet - 1 tablet oral taken 1 time per day for 14 days. Do not take Calcium, Potassium, Magnesium, Fiber, Zinc or a Multivitamin with this Bactrim DS 03/24/2020 Saldarriaga, Marcus ([id number]) Notifications Medication Indication Start End DOSE 1 - oral 800 mg-160 mg tablet - 1 tablet oral taken 2 times per day for 14 days. Do not take your potassium supplement while on this medication. Continue levaquin as well Electronic Signature(s) Signed: 03/24/2020 4:23:41 PM By: Worthy Keeler PA-C Previous Signature: 03/24/2020 9:49:13 AM Version By: Worthy Keeler PA-C Previous Signature: 03/19/2020 5:23:32 PM Version By: Worthy Keeler PA-C Previous Signature: 03/19/2020 3:17:48 PM Version By: Worthy Keeler PA-C Entered By: Worthy Keeler on 03/24/2020 09:52:12 Tarleton, [name] ([id number]) -------------------------------------------------------------------------------- Problem List Details Patient Name: [name] Date of Service: 03/19/2020 2:15 PM Medical Record Number: [id number] Patient Account Number: [id number] Date of Birth/Sex: 04-15-1936 (84 y.o. M) Treating RN: Cornell Barman Primary Care Provider: [name] Other Clinician: Referring Provider: [name] Treating Provider/Extender: Melburn Hake, Giara Mcgaughey Weeks in Treatment: 5 Active Problems ICD-10 Encounter Code Description Active Date MDM Diagnosis L89.623 Pressure ulcer of left heel, stage 3 02/11/2020 No Yes G60.3 Idiopathic progressive neuropathy 02/11/2020 No Yes M21.372 Foot drop, left foot 02/11/2020 No Yes M21.371 Foot drop, right foot 02/11/2020 No Yes Inactive Problems Resolved Problems Electronic Signature(s) Signed: 03/19/2020 2:13:59 PM By: Worthy Keeler PA-C Entered By: Worthy Keeler on 03/19/2020 14:13:58 Canter, [name] ([id number]) -------------------------------------------------------------------------------- Progress Note Details Patient Name: [name] Date of Service: 03/19/2020 2:15 PM Medical Record Number: [id number] Patient Account Number: [id number] Date of Birth/Sex: 11-07-1935 (84 y.o. M) Treating RN: Cornell Barman Primary Care Provider: [name] Other Clinician: Referring Provider: [name] Treating Provider/Extender: Melburn Hake, Leyah Bocchino Weeks in Treatment: 5 Subjective Chief Complaint Information obtained from Patient Pressure ulcer left heel History of Present Illness (HPI) 02/11/2020 on evaluation today patient appears to be doing somewhat poorly on initial evaluation concerning his heel. He has been using a topical antibiotic ointment which does not seem to really have been doing the best job for him. He tells me that he had a wound that occurred as a result of his padding in the bottom of his shoe wear his foot drop brace goes actually had worn through and his foot was actually rubbing on the bottom of the shoe on the brace itself. Subsequently this ended up with a wound and he in turn had a lot of issues following. Fortunately there is no signs of active infection at this time which is good news in my opinion. With that being  said the wound does appear to be somewhat moist with some evidence of maceration unfortunately. He has no pain he does have neuropathy. He also has bilateral foot drop which she wears braces. This does appear to be however a pressure injury secondary to the AFO brace. The patient has been seen by podiatry since May 2021 unfortunately this is not healing hence the reason that he is coming to see [place] at this point to see if there is anything we can do to help in this regard. He  does have a left first toe amputation and wears padding on the second toe in order to help prevent this from breaking down. 9/15; the patient missed his appointment last week due to illness with his wife who had to be hospitalized. We are using Hydrofera Blue to the small punched out area in the middle of the left plantar heel. 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 This is felt to be pressure from the brace itself. 03/05/2020 upon evaluation today patient appears to be doing well in regard to his wound on the heel. He has been tolerating the dressing changes without complication which is great news. Fortunately there is no signs of active infection which I am very happy with. 03/19/2020 upon evaluation today patient appears to be doing a little worse in regard to his heel based on what I am seeing at this point. There actually appears to be a pocket off to the side laterally that is problematic for him at this point. I am not to clear this way with some sharp debridement currently. The patient was in agreement with that plan. Objective Constitutional Well-nourished and well-hydrated in no acute distress. Vitals Time Taken: 2:15 AM, Height: 72 in, Weight: 200 lbs, BMI: 27.1, Temperature: 97.6 F, Pulse: 108 bpm, Respiratory Rate: 22 breaths/min, Blood Pressure: 137/69 mmHg. Respiratory normal breathing without difficulty. Psychiatric this patient is able to make decisions and demonstrates  good insight into disease process. Alert and Oriented x 3. pleasant and cooperative. General Notes: Upon inspection patient's wound bed actually showed signs of good granulation at this time in the base of the wound but unfortunately he does have a lot of callus around and there is some blue/green drainage as well noted which has me concerned about the possibility of Pseudomonas. A wound culture was obtained we will see what that shows and then subsequently were also good to see what we can do about going ahead initiating treatment with Levaquin for him as he feels like Cipro caused some trouble in the past nothing else allergy like but just he did not tolerate it well and hoping Levaquin would be better. Integumentary (Hair, Skin) Wound #1 status is Open. Original cause of wound was Trauma. The wound is located on the Left,Plantar Calcaneus. The wound measures 0.4cm length x 0.4cm width x 0.8cm depth; 0.126cm^2 area and 0.101cm^3 volume. There is Fat Layer (Subcutaneous Tissue) exposed. There is a large amount of sanguinous drainage noted. The wound margin is flat and intact. There is medium (34-66%) pink granulation within the wound bed. There is a medium (34-66%) amount of necrotic tissue within the wound bed including Adherent Slough. Kolinski, [name] ([id number]) Assessment Active Problems ICD-10 Pressure ulcer of left heel, stage 3 Idiopathic progressive neuropathy Foot drop, left foot Foot drop, right foot Procedures Wound #1 Pre-procedure diagnosis of Wound #1 is a Trauma, Other located on the Left,Plantar Calcaneus . There was a Excisional Skin/Subcutaneous Tissue Debridement with a total area of 0.2 sq cm performed by STONE III, Saragrace Selke E., PA-C. With the following instrument(s): Curette to remove Viable and Non-Viable tissue/material. Material removed includes Callus, Subcutaneous Tissue, and Slough. 1 specimen was taken by a Swab and sent to the lab per facility protocol. A time out was  conducted at 14:55, prior to the start of the procedure. A Moderate amount of bleeding was controlled with Pressure. The procedure was tolerated well. Post Debridement Measurements: 0.5cm length x 0.4cm width x 0.8cm depth; 0.126cm^3 volume. Character of Wound/Ulcer Post  Debridement is stable. Post procedure Diagnosis Wound #1: Same as Pre-Procedure Plan Wound Cleansing: Wound #1 Left,Plantar Calcaneus: Dial antibacterial soap, wash wounds, rinse and pat dry prior to dressing wounds Primary Wound Dressing: Wound #1 Left,Plantar Calcaneus: Silver Alginate Secondary Dressing: Wound #1 Left,Plantar Calcaneus: Conform/Kerlix - Heel cup secured by stretch net Dressing Change Frequency: Wound #1 Left,Plantar Calcaneus: Change dressing every other day. Follow-up Appointments: Wound #1 Left,Plantar Calcaneus: Return Appointment in 1 week. - Nurse visit to ensure no worsening of the wound - Call [facility] if any complications Return Appointment in 2 weeks. - Provider visit Off-Loading: Wound #1 Left,Plantar Calcaneus: Other: - keep pressure off of heel Laboratory ordered were: Wound culture routine - Left heel culture obtained The following medication(s) was prescribed: Levaquin oral 500 mg tablet 1 1 tablet oral taken 1 time per day for 14 days. Do not take Calcium, Potassium, Magnesium, Fiber, Zinc or a Multivitamin with this starting 03/19/2020 Bactrim DS oral 800 mg-160 mg tablet 1 1 tablet oral taken 2 times per day for 14 days. Do not take your potassium supplement while on this medication. Continue levaquin as well starting 03/24/2020 1. I would recommend currently that we go ahead and continue with the dressing changes every other day Minna switch to silver alginate however. 2. I am also can recommend that the patient continue with a heel cup to offload and Curlex to hold and secure everything in place. 3. I am get a go ahead initiate treatment with Levaquin I did send this into the  pharmacy. 4. I would also recommend appropriate and continued offloading. We will see patient back for reevaluation in 1 week here in the clinic. If anything worsens or changes patient will contact our office for additional recommendations. Cleary, Marcus ([id number]) Patient's wound culture was actually reviewed and did show that he had evidence of both Pseudomonas and MRSA. Subsequently I did actually go ahead and add treatment with Bactrim DS the patient will not take his potassium supplementation while he is on this medication which should negate the effects which we had any interaction with his current medicines. Subsequently we will see where things stand at follow-up. Electronic Signature(s) Signed: 03/24/2020 9:52:28 AM By: Worthy Keeler PA-C Previous Signature: 03/24/2020 9:49:56 AM Version By: Worthy Keeler PA-C Previous Signature: 03/19/2020 4:27:13 PM Version By: Worthy Keeler PA-C Previous Signature: 03/19/2020 4:25:45 PM Version By: Worthy Keeler PA-C Entered By: Worthy Keeler on 03/24/2020 09:52:28 [name] ([id number]) -------------------------------------------------------------------------------- SuperBill Details Patient Name: [name] Date of Service: 03/19/2020 Medical Record Number: [id number] Patient Account Number: [id number] Date of Birth/Sex: 1936-04-03 (84 y.o. 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 Foot drop, left foot M21.371 Foot drop, right foot Facility Procedures CPT4 Code: [id number] Description: 11042 - DEB SUBQ TISSUE 20 SQ CM/< Modifier: Quantity: 1 CPT4 Code: Description: ICD-10 Diagnosis Description L89.623 Pressure ulcer of left heel, stage  3 Modifier: Quantity: Physician Procedures CPT4 Code: [id number] Description: 99214 - WC PHYS LEVEL 4 - EST PT Modifier: 25 Quantity: 1 CPT4 Code: Description: ICD-10 Diagnosis Description L89.623 Pressure ulcer of left heel, stage 3 G60.3 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

## 2020-03-22 LAB — AEROBIC CULTURE W GRAM STAIN (SUPERFICIAL SPECIMEN)

## 2020-03-23 ENCOUNTER — Telehealth: Payer: Self-pay

## 2020-03-23 NOTE — Telephone Encounter (Signed)
Copied from [place] [id number]. Topic: Referral - Request for Referral >> Mar 23, 2020  3:08 PM Scherrie Gerlach wrote: Pt states due to his wife's medical conditions, he was unable to make all his PT appts with [facility] in [place]. They need a new referral from Dr K to see him again.  He states he is ready to get back and needs to go. Pt can make his appts now.

## 2020-03-24 NOTE — Telephone Encounter (Signed)
Handwritten order to fax to [place] location  [name], [facility] 03/24/2020, 12:29 PM

## 2020-03-26 ENCOUNTER — Other Ambulatory Visit: Payer: Self-pay

## 2020-03-26 DIAGNOSIS — L89623 Pressure ulcer of left heel, stage 3: Secondary | ICD-10-CM | POA: Diagnosis not present

## 2020-03-26 DIAGNOSIS — Z885 Allergy status to narcotic agent status: Secondary | ICD-10-CM | POA: Diagnosis not present

## 2020-03-26 DIAGNOSIS — Z888 Allergy status to other drugs, medicaments and biological substances status: Secondary | ICD-10-CM | POA: Diagnosis not present

## 2020-03-26 DIAGNOSIS — M21372 Foot drop, left foot: Secondary | ICD-10-CM | POA: Diagnosis not present

## 2020-03-26 DIAGNOSIS — G603 Idiopathic progressive neuropathy: Secondary | ICD-10-CM | POA: Diagnosis not present

## 2020-03-26 DIAGNOSIS — M21371 Foot drop, right foot: Secondary | ICD-10-CM | POA: Diagnosis not present

## 2020-03-26 NOTE — Progress Notes (Signed)
Marcus, Holt ([id number]) Visit Report for 03/26/2020 Arrival Information Details Patient Name: [name], [name] Date of Service: 03/26/2020 11:00 AM Medical Record Number: [id number] Patient Account Number: [id number] Date of Birth/Sex: June 20, 1935 (84 y.o. M) Treating RN: Army Melia Primary Care Kelsey Durflinger: [name] Other Clinician: Referring Calbert Hulsebus: [name] Treating Margaretmary Prisk/Extender: Melburn Hake, HOYT Weeks in Treatment: 6 Visit Information History Since Last Visit Added or deleted any medications: No Patient Arrived: Walker Any new allergies or adverse reactions: No Arrival Time: 10:57 Had a fall or experienced change in No Accompanied By: self activities of daily living that may affect Transfer Assistance: None risk of falls: Patient Identification Verified: Yes Signs or symptoms of abuse/neglect since last visito No Patient Requires Transmission-Based Precautions: No Hospitalized since last visit: No Patient Has Alerts: No Has Dressing in Place as Prescribed: Yes Pain Present Now: No Electronic Signature(s) Signed: 03/26/2020 10:58:12 AM By: Army Melia Entered By: Army Melia on 03/26/2020 10:58:11 [name] ([id number]) -------------------------------------------------------------------------------- Clinic Level of Care Assessment Details Patient Name: [name] Date of Service: 03/26/2020 11:00 AM Medical Record Number: [id number] Patient Account Number: [id number] Date of Birth/Sex: 05-05-1936 (84 y.o. M) Treating RN: Army Melia Primary Care Vantasia Pinkney: [name] Other Clinician: Referring Adilson Grafton: [name] Treating Clive Parcel/Extender: Melburn Hake, HOYT Weeks in Treatment: 6 Clinic Level of Care Assessment Items TOOL 4 Quantity Score []  - Use when only an EandM is performed on FOLLOW-UP visit 0 ASSESSMENTS - Nursing Assessment / Reassessment X - Reassessment of Co-morbidities (includes updates in patient status) 1  10 X- 1 5 Reassessment of Adherence to Treatment Plan ASSESSMENTS - Wound and Skin Assessment / Reassessment X - Simple Wound Assessment / Reassessment - one wound 1 5 []  - 0 Complex Wound Assessment / Reassessment - multiple wounds []  - 0 Dermatologic / Skin Assessment (not related to wound area) ASSESSMENTS - Focused Assessment []  - Circumferential Edema Measurements - multi extremities 0 []  - 0 Nutritional Assessment / Counseling / Intervention []  - 0 Lower Extremity Assessment (monofilament, tuning fork, pulses) []  - 0 Peripheral Arterial Disease Assessment (using hand held doppler) ASSESSMENTS - Ostomy and/or Continence Assessment and Care []  - Incontinence Assessment and Management 0 []  - 0 Ostomy Care Assessment and Management (repouching, etc.) PROCESS - Coordination of Care X - Simple Patient / Family Education for ongoing care 1 15 []  - 0 Complex (extensive) Patient / Family Education for ongoing care []  - 0 Staff obtains Programmer, systems, Records, Test Results / Process Orders []  - 0 Staff telephones HHA, Nursing Homes / Clarify orders / etc []  - 0 Routine Transfer to another Facility (non-emergent condition) []  - 0 Routine Hospital Admission (non-emergent condition) []  - 0 New Admissions / Biomedical engineer / Ordering NPWT, Apligraf, etc. []  - 0 Emergency Hospital Admission (emergent condition) X- 1 10 Simple Discharge Coordination []  - 0 Complex (extensive) Discharge Coordination PROCESS - Special Needs []  - Pediatric / Minor Patient Management 0 []  - 0 Isolation Patient Management []  - 0 Hearing / Language / Visual special needs []  - 0 Assessment of Community assistance (transportation, D/C planning, etc.) []  - 0 Additional assistance / Altered mentation X- 1 15 Support Surface(s) Assessment (bed, cushion, seat, etc.) INTERVENTIONS - Wound Cleansing / Measurement Enzor, Arnell ([id number]) X- 1 5 Simple Wound Cleansing - one wound []  - 0 Complex  Wound Cleansing - multiple wounds X- 1 5 Wound Imaging (photographs - any number of wounds) []  - 0 Wound Tracing (instead of photographs) X- 1 5 Simple Wound Measurement -  one wound []  - 0 Complex Wound Measurement - multiple wounds INTERVENTIONS - Wound Dressings []  - Small Wound Dressing one or multiple wounds 0 X- 1 15 Medium Wound Dressing one or multiple wounds []  - 0 Large Wound Dressing one or multiple wounds []  - 0 Application of Medications - topical []  - 0 Application of Medications - injection INTERVENTIONS - Miscellaneous []  - External ear exam 0 []  - 0 Specimen Collection (cultures, biopsies, blood, body fluids, etc.) []  - 0 Specimen(s) / Culture(s) sent or taken to Lab for analysis []  - 0 Patient Transfer (multiple staff / Civil Service fast streamer / Similar devices) []  - 0 Simple Staple / Suture removal (25 or less) []  - 0 Complex Staple / Suture removal (26 or more) []  - 0 Hypo / Hyperglycemic Management (close monitor of Blood Glucose) []  - 0 Ankle / Brachial Index (ABI) - do not check if billed separately []  - 0 Vital Signs Has the patient been seen at the hospital within the last three years: Yes Total Score: 90 Level Of Care: New/Established - Level 3 Electronic Signature(s) Signed: 03/26/2020 10:59:44 AM By: Army Melia Entered By: Army Melia on 03/26/2020 10:59:28 [name] ([id number]) -------------------------------------------------------------------------------- Encounter Discharge Information Details Patient Name: [name] Date of Service: 03/26/2020 11:00 AM Medical Record Number: [id number] Patient Account Number: [id number] Date of Birth/Sex: [birth year] (84 y.o. M) Treating RN: Army Melia Primary Care Nichollas Perusse: [name] Other Clinician: Referring Rory Montel: [name] Treating Victor Granados/Extender: Melburn Hake, HOYT Weeks in Treatment: 6 Encounter Discharge Information Items Discharge Condition: Stable Ambulatory  Status: Walker Discharge Destination: Home Transportation: Private Auto Accompanied By: self Schedule Follow-up Appointment: Yes Clinical Summary of Care: Electronic Signature(s) Signed: 03/26/2020 10:58:59 AM By: Army Melia Entered By: Army Melia on 03/26/2020 10:58:59 [name] ([id number]) -------------------------------------------------------------------------------- Wound Assessment Details Patient Name: [name] Date of Service: 03/26/2020 11:00 AM Medical Record Number: [id number] Patient Account Number: [id number] Date of Birth/Sex: 1936-02-06 (84 y.o. M) Treating RN: Army Melia Primary Care Samson Ralph: [name] Other Clinician: Referring Khia Dieterich: [name] Treating Donnica Jarnagin/Extender: Melburn Hake, HOYT Weeks in Treatment: 6 Wound Status Wound Number: 1 Primary Etiology: Trauma, Other Wound Location: Left, Plantar Calcaneus Secondary Etiology: Pressure Ulcer Wounding Event: Trauma Wound Status: Open Date Acquired: 10/21/2019 Weeks Of Treatment: 6 Clustered Wound: No Wound Measurements Length: (cm) 0.4 Width: (cm) 0.4 Depth: (cm) 0.3 Area: (cm) 0.126 Volume: (cm) 0.038 % Reduction in Area: 35.7% % Reduction in Volume: 51.9% Wound Description Classification: Full Thickness Without Exposed Support Structure s Treatment Notes Wound #1 (Left, Plantar Calcaneus) Notes S Cell, gauze, heel cup secured with stretch net Electronic Signature(s) Signed: 03/26/2020 10:59:44 AM By: Army Melia Entered By: Army Melia on 03/26/2020 10:58:27

## 2020-03-28 ENCOUNTER — Encounter: Payer: Self-pay | Admitting: Emergency Medicine

## 2020-03-28 ENCOUNTER — Other Ambulatory Visit: Payer: Self-pay

## 2020-03-28 ENCOUNTER — Ambulatory Visit
Admission: EM | Admit: 2020-03-28 | Discharge: 2020-03-28 | Disposition: A | Discharge status: Home / Self Care | Payer: Medicare Other | Attending: Physician Assistant | Admitting: Physician Assistant

## 2020-03-28 DIAGNOSIS — Z4800 Encounter for change or removal of nonsurgical wound dressing: Secondary | ICD-10-CM | POA: Diagnosis not present

## 2020-03-28 DIAGNOSIS — L97529 Non-pressure chronic ulcer of other part of left foot with unspecified severity: Secondary | ICD-10-CM | POA: Diagnosis not present

## 2020-03-28 NOTE — ED Triage Notes (Signed)
Patient in today requesting a dressing change to an area on his left heel. Patient has seen [facility] wound clinic and was given antibiotics and "silvercell" to change dressing qod. Patient usually has a home health nurse come and do the dressing change, but she couldn't come today.

## 2020-03-28 NOTE — ED Provider Notes (Signed)
[facility]    CSN: [id number] Arrival date & time: 03/28/20  1056      History   Chief Complaint Chief Complaint  Patient presents with   Dressing Change    HPI [name] is a 84 y.o. male presenting for dressing change of the left heel.  He says that his home nurse had a back injury could not come and change the bandage at home today.  He normally uses Silvercel and cannot find the dressing.  He says he cannot change the dressing himself anyway. The Silvercel dressing is changed every other day. Patient is being seen at [facility] wound care clinic weekly and is currently taking Levaquin and Bactrim for cellulitis of the left leg.  He says he has about 6 days left of Levaquin and is due to start the Bactrim.  Denies any fever denies any worsening of cellulitis and says he feels better overall.  Patient says he is only here to have his dressing changed today and feels well.  HPI  Past Medical History:  Diagnosis Date   Arthritis    Benign essential tremor    Fracture, femur ([place]) 2012   GERD (gastroesophageal reflux disease)    H/O echocardiogram    05/2013   Hx of colonoscopy    04/22/2013   Hypercholesterolemia    Hypertension    Myocardial infarction [facility])    1987   Polyneuropathy    Prostate cancer ([place]) 2004   Superficial hematoma     Patient Active Problem List   Diagnosis Date Noted   Osteoarthritis of multiple joints 12/15/2017   Chronic hyponatremia 03/28/2017   Abnormality of gait and mobility 01/11/2017   Cloudy vision 01/11/2017   Foot drop, bilateral 01/11/2017   Amputee, great toe, left ([place]) 01/11/2017   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   Macrocytic anemia 09/22/2015   Moderate tricuspid insufficiency 04/21/2015   History of prostate cancer 04/16/2015   Idiopathic peripheral neuropathy 04/16/2015   Essential hypertension 04/16/2015   GERD (gastroesophageal reflux disease) 04/16/2015    Arthritis 04/16/2015   Pedal edema 04/16/2015   Benign essential tremor 10/30/2014   Hyperlipidemia, mixed 26/33/3545   Chronic systolic CHF (congestive heart failure), NYHA class 2 ([place]) 04/23/2014   Mitral insufficiency 11/05/2013   CAD (coronary artery disease) 11/01/2013   Basal cell carcinoma of skin of other parts of face 03/14/2013   Ankle sprain 09/09/2011   History of fibula fracture 09/09/2011    Past Surgical History:  Procedure Laterality Date   BASAL CELL CARCINOMA EXCISION     COLONOSCOPY  05/01/2009   Positive for colonic polyps   COLONOSCOPY WITH PROPOFOL N/A 02/24/2016   Procedure: COLONOSCOPY WITH PROPOFOL;  Surgeon: Christene Lye, MD;  Location: [facility] ENDOSCOPY;  Service: Endoscopy;  Laterality: N/A;   CORONARY ARTERY BYPASS GRAFT  1987   [place]   PROSTATE SURGERY  2004       Home Medications    Prior to Admission medications   Medication Sig Start Date End Date Taking? Authorizing Provider  Alpha Lipoic Acid 200 MG CAPS Take 2 capsules by mouth daily.   Yes [provider]  amLODipine (NORVASC) 2.5 MG tablet TAKE 1 TABLET BY MOUTH  DAILY 02/23/20  Yes Karamalegos, Devonne Doughty, DO  Apoaequorin (PREVAGEN PO) Take by mouth.   Yes [provider]  aspirin 81 MG tablet Take 81 mg by mouth daily.    Yes [provider]  azelastine (ASTELIN) 0.1 % nasal spray Place 1 spray into both nostrils 2 (two) times daily. Use in each nostril as directed 12/23/19  Yes Karamalegos, Devonne Doughty, DO  B Complex Vitamins (VITAMIN-B COMPLEX PO) Take 1 tablet by mouth daily. Reported on 10/06/2015   Yes [provider]  Biotin 1000 MCG tablet Take 1,000 mcg by mouth daily.   Yes [provider]  Calcium Carb-Cholecalciferol 500-100 MG-UNIT CHEW Chew 500 mg by mouth daily.   Yes [provider]  cyanocobalamin 1000 MCG tablet Take 1,000 mcg by mouth daily.   Yes [provider]  folic  acid (FOLVITE) 1 MG tablet Take 1 mg by mouth daily.   Yes [provider]  furosemide (LASIX) 20 MG tablet TAKE 1 TABLET BY MOUTH  DAILY 02/23/20  Yes Karamalegos, Devonne Doughty, DO  gabapentin (NEURONTIN) 600 MG tablet TAKE 2 TABLETS BY MOUTH  TWICE DAILY 02/23/20  Yes Karamalegos, Devonne Doughty, DO  Glucosamine Sulfate 1000 MG CAPS Take 1 capsule by mouth daily.   Yes [provider]  hydrochlorothiazide (HYDRODIURIL) 25 MG tablet Take 1 tablet (25 mg total) by mouth 2 (two) times daily. 09/20/19  Yes Karamalegos, Devonne Doughty, DO  Lecithin 400 MG CAPS Take 400 mg by mouth daily.   Yes [provider]  losartan (COZAAR) 50 MG tablet TAKE 1 TABLET BY MOUTH  DAILY 02/24/20  Yes Karamalegos, Devonne Doughty, DO  Melatonin 3 MG CAPS Take 3 mg by mouth at bedtime.   Yes [provider]  meloxicam (MOBIC) 15 MG tablet Take 1 tablet (15 mg total) by mouth daily as needed for pain. For up to 1-2 weeks, then reduce to less frequent for longer if need 03/06/18  Yes Karamalegos, Alexander J, DO  Omega 3 1000 MG CAPS Take 1,000 mg by mouth daily.   Yes [provider]  omeprazole (PRILOSEC) 20 MG capsule TAKE 1 CAPSULE BY MOUTH  TWICE DAILY BEFORE MEALS 02/23/20  Yes Karamalegos, Devonne Doughty, DO  ondansetron (ZOFRAN-ODT) 8 MG disintegrating tablet Take 1 tablet (8 mg total) by mouth daily as needed for nausea or vomiting. 09/05/19  Yes Karamalegos, Devonne Doughty, DO  potassium chloride SA (KLOR-CON) 20 MEQ tablet Take 1 tablet (20 mEq total) by mouth 2 (two) times daily. 12/19/19  Yes Karamalegos, Devonne Doughty, DO  pravastatin (PRAVACHOL) 80 MG tablet TAKE 1 TABLET BY MOUTH  DAILY 02/23/20  Yes Karamalegos, Devonne Doughty, DO  primidone (MYSOLINE) 50 MG tablet TAKE 1 TABLET BY MOUTH 3  TIMES DAILY 02/24/20  Yes Karamalegos, Devonne Doughty, DO  Selenium 200 MCG CAPS Take 200 mcg by mouth daily.   Yes [provider]  tretinoin (RETIN-A) 0.05 % cream Apply 1 application topically at  bedtime.   Yes [provider]    Family History Family History  Problem Relation Age of Onset   Heart disease Mother    Congestive Heart Failure Father     Social History Social History   Tobacco Use   Smoking status: Never Smoker   Smokeless tobacco: Never Used  Vaping Use   Vaping Use: Never used  Substance Use Topics   Alcohol use: Yes    Alcohol/week: 7.0 standard drinks    Types: 7 Glasses of wine per week    Comment: wine in the evening with dinner   Drug use: No     Allergies   Codeine, Indocin [indomethacin], Latex, Mupirocin, Plavix [clopidogrel bisulfate], Polysporin [bacitracin-polymyxin b], Shellfish allergy, Tape, Neosporin [  neomycin-bacitracin zn-polymyx], and Other   Review of Systems Review of Systems  Constitutional: Negative for fatigue and fever.  Musculoskeletal: Positive for gait problem. Negative for arthralgias, back pain and joint swelling.  Skin: Positive for wound. Negative for color change, pallor and rash.  Neurological: Positive for numbness. Negative for weakness.  Hematological: Does not bruise/bleed easily.     Physical Exam Triage Vital Signs ED Triage Vitals  Enc Vitals Group     BP 03/28/20 1127 123/72     Pulse Rate 03/28/20 1127 88     Resp 03/28/20 1127 18     Temp 03/28/20 1127 98.6 F (37 C)     Temp Source 03/28/20 1127 Oral     SpO2 03/28/20 1127 97 %     Weight 03/28/20 1126 200 lb (90.7 kg)     Height 03/28/20 1126 5\' 11"  (1.803 m)     Head Circumference --      Peak Flow --      Pain Score 03/28/20 1126 0     Pain Loc --      Pain Edu? --      Excl. in [place]? --    No data found.  Updated Vital Signs BP 123/72 (BP Location: Left Arm)    Pulse 88    Temp 98.6 F (37 C) (Oral)    Resp 18    Ht 5\' 11"  (1.803 m)    Wt 200 lb (90.7 kg)    SpO2 97%    BMI 27.89 kg/m       Physical Exam Vitals and nursing note reviewed.  Constitutional:      General: He is not in acute distress.     Appearance: Normal appearance. He is well-developed. He is not ill-appearing or toxic-appearing.  HENT:     Head: Normocephalic and atraumatic.  Eyes:     General: No scleral icterus.    Conjunctiva/sclera: Conjunctivae normal.  Cardiovascular:     Rate and Rhythm: Normal rate and regular rhythm.     Pulses: Normal pulses.  Pulmonary:     Effort: Pulmonary effort is normal. No respiratory distress.  Musculoskeletal:     Cervical back: Neck supple.  Skin:    General: Skin is warm and dry.     Comments: There is an ulceration of the calcaneus which is not draining any fluid.  Dressing removed and changed at this time.  No tenderness but he does have decreased sensation in the area.  Mild surrounding erythema but this is improved from last visit.  There is mild erythema of the left lower leg, but again this is improved from the last visit and he is on dual antibiotics.  Neurological:     General: No focal deficit present.     Mental Status: He is alert. Mental status is at baseline.     Motor: No weakness.     Gait: Gait abnormal (uses walker, had lower leg braces, wide based gait).  Psychiatric:        Mood and Affect: Mood normal.        Behavior: Behavior normal.        Thought Content: Thought content normal.           UC Treatments / Results  Labs (all labs ordered are listed, but only abnormal results are displayed) Labs Reviewed - No data to display  EKG   Radiology No results found.  Procedures Wound Care  Date/Time: 03/28/2020 12:56 PM Performed by: Laurene Footman  B, PA-C Authorized by: Danton Clap, PA-C   Consent:    Consent obtained:  Verbal   Consent given by:  Patient   Risks discussed:  Pain and incomplete drainage   Alternatives discussed:  No treatment, delayed treatment and alternative treatment Anesthesia (see MAR for exact dosages):    Anesthesia method:  None Procedure details:    Indications: open wounds     Wound exploration location:  extremity     Debridement level: subcutaneous tissue     Wound surface area (sq cm):  0.3 Dressing:    Dressing: calcium alginate.   Wrapped with:  C[place] 2 inch Post-procedure details:    Patient tolerance of procedure:  Tolerated well, no immediate complications Comments:     Calcium alginate used   (including critical care time)  Medications Ordered in UC Medications - No data to display  Initial Impression / Assessment and Plan / UC Course  I have reviewed the triage vital signs and the nursing notes.  Pertinent labs & imaging results that were available during my care of the patient were reviewed by me and considered in my medical decision making (see chart for details).   Dressing changed with calcium alginate dressing.  No sign of significant infection.  Advised to continue on Levaquin and Bactrim as prescribed.  Advised to follow-up with wound clinic weekly as he has.  Advised to follow-up with his nurse for routine dressing changes and find a backup in case needed.  ED precautions discussed.   Final Clinical Impressions(s) / UC Diagnoses   Final diagnoses:  Ulcer of foot, chronic, left, with unspecified severity ([place])  Change of dressing   Discharge Instructions   None    ED Prescriptions    None     PDMP not reviewed this encounter.   Danton Clap, PA-C 03/28/20 1300

## 2020-03-31 ENCOUNTER — Ambulatory Visit: Payer: Medicare Other | Admitting: Physician Assistant

## 2020-04-01 ENCOUNTER — Encounter: Payer: Medicare Other | Admitting: Internal Medicine

## 2020-04-01 ENCOUNTER — Other Ambulatory Visit: Payer: Self-pay

## 2020-04-01 DIAGNOSIS — R2681 Unsteadiness on feet: Secondary | ICD-10-CM | POA: Diagnosis not present

## 2020-04-01 DIAGNOSIS — G603 Idiopathic progressive neuropathy: Secondary | ICD-10-CM | POA: Diagnosis not present

## 2020-04-01 DIAGNOSIS — R260 Ataxic gait: Secondary | ICD-10-CM | POA: Diagnosis not present

## 2020-04-01 DIAGNOSIS — M21371 Foot drop, right foot: Secondary | ICD-10-CM | POA: Diagnosis not present

## 2020-04-01 DIAGNOSIS — Z888 Allergy status to other drugs, medicaments and biological substances status: Secondary | ICD-10-CM | POA: Diagnosis not present

## 2020-04-01 DIAGNOSIS — R262 Difficulty in walking, not elsewhere classified: Secondary | ICD-10-CM | POA: Diagnosis not present

## 2020-04-01 DIAGNOSIS — L89623 Pressure ulcer of left heel, stage 3: Secondary | ICD-10-CM | POA: Diagnosis not present

## 2020-04-01 DIAGNOSIS — Z9181 History of falling: Secondary | ICD-10-CM | POA: Diagnosis not present

## 2020-04-01 DIAGNOSIS — Z885 Allergy status to narcotic agent status: Secondary | ICD-10-CM | POA: Diagnosis not present

## 2020-04-01 DIAGNOSIS — M21372 Foot drop, left foot: Secondary | ICD-10-CM | POA: Diagnosis not present

## 2020-04-03 NOTE — Progress Notes (Signed)
Marcus, Holt ([id number]) Visit Report for 04/01/2020 HPI Details Patient Name: [name] Date of Service: 04/01/2020 12:45 PM Medical Record Number: [id number] Patient Account Number: [id number] Date of Birth/Sex: December 08, 1935 (84 y.o. M) Treating RN: Cornell Barman Primary Care Provider: [name] Other Clinician: Referring Provider: [name] Treating Provider/Extender: Tito Dine in Treatment: 7 History of Present Illness HPI Description: 02/11/2020 on evaluation today patient appears to be doing somewhat poorly on initial evaluation concerning his heel. He has been using a topical antibiotic ointment which does not seem to really have been doing the best job for him. He tells me that he had a wound that occurred as a result of his padding in the bottom of his shoe wear his foot drop brace goes actually had worn through and his foot was actually rubbing on the bottom of the shoe on the brace itself. Subsequently this ended up with a wound and he in turn had a lot of issues following. Fortunately there is no signs of active infection at this time which is good news in my opinion. With that being said the wound does appear to be somewhat moist with some evidence of maceration unfortunately. He has no pain he does have neuropathy. He also has bilateral foot drop which she wears braces. This does appear to be however a pressure injury secondary to the AFO brace. The patient has been seen by podiatry since May 2021 unfortunately this is not healing hence the reason that he is coming to see [place] at this point to see if there is anything we can do to help in this regard. 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 9/15; the patient missed his appointment last week due to illness with his wife who had to be hospitalized. We are using Hydrofera Blue to the small punched out area in the  middle of the left plantar heel. 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 This is felt to be pressure from the brace itself. 03/05/2020 upon evaluation today patient appears to be doing well in regard to his wound on the heel. He has been tolerating the dressing changes without complication which is great news. Fortunately there is no signs of active infection which I am very happy with. 03/19/2020 upon evaluation today patient appears to be doing a little worse in regard to his heel based on what I am seeing at this point. There actually appears to be a pocket off to the side laterally that is problematic for him at this point. I am not to clear this way with some sharp debridement currently. The patient was in agreement with that plan. 10/20; patient I do not usually see. He has a small wound on the left plantar heel. Some depth. He is apparently completing antibiotics. He has bilateral foot drop and therefore is AFO braces limiting what we can do to offload this. Electronic Signature(s) Signed: 04/03/2020 8:18:14 AM By: Linton Ham MD Entered By: Linton Ham on 04/01/2020 13:11:00 [name] ([id number]) -------------------------------------------------------------------------------- Physical Exam Details Patient Name: [name] Date of Service: 04/01/2020 12:45 PM Medical Record Number: [id number] Patient Account Number: [id number] Date of Birth/Sex: Feb 20, 1936 (84 y.o. M) Treating RN: Cornell Barman Primary Care Provider: [name] Other Clinician: Referring Provider: [name] Treating Provider/Extender: Tito Dine in Treatment: 7 Constitutional Sitting or standing Blood Pressure is within target range for patient.. Pulse  regular and within target range for patient.Marland Kitchen Respirations regular, non- labored and within target range.. Temperature is normal and within the target range for the patient.Marland Kitchen appears in no  distress. Cardiovascular Pedal pulses are palpable.. Notes Wound exam; left plantar heel wound small wound with healthy base slight amount of depth perhaps 2 mm. There is no drainage no surrounding erythema. Electronic Signature(s) Signed: 04/03/2020 8:18:14 AM By: Linton Ham MD Entered By: Linton Ham on 04/01/2020 13:12:56 [name] ([id number]) -------------------------------------------------------------------------------- Physician Orders Details Patient Name: [name] Date of Service: 04/01/2020 12:45 PM Medical Record Number: [id number] Patient Account Number: [id number] Date of Birth/Sex: 1936-01-21 (84 y.o. M) Treating RN: Cornell Barman Primary Care Provider: [name] Other Clinician: Referring Provider: [name] Treating Provider/Extender: Tito Dine in Treatment: 7 Verbal / Phone Orders: No Diagnosis Coding Wound Cleansing Wound #1 Left,Plantar Calcaneus o Dial antibacterial soap, wash wounds, rinse and pat dry prior to dressing wounds Primary Wound Dressing Wound #1 Left,Plantar Calcaneus o Silver Alginate Secondary Dressing Wound #1 Left,Plantar Calcaneus o Conform/Kerlix - Heel cup secured by stretch net Dressing Change Frequency Wound #1 Left,Plantar Calcaneus o Change dressing every other day. Follow-up Appointments Wound #1 Left,Plantar Calcaneus o Return Appointment in 1 week. - Nurse visit to ensure no worsening of the wound - Call [facility] if any complications o Return Appointment in 2 weeks. - Provider visit Off-Loading Wound #1 Left,Plantar Calcaneus o Other: - keep pressure off of heel Electronic Signature(s) Signed: 04/02/2020 5:56:15 PM By: Gretta Cool, BSN, RN, CWS, Kim RN, BSN Signed: 04/03/2020 8:18:14 AM By: Linton Ham MD Entered By: Gretta Cool, BSN, RN, CWS, Kim on 04/01/2020 13:06:24 [name]  ([id number]) -------------------------------------------------------------------------------- Problem List Details Patient Name: [name] Date of Service: 04/01/2020 12:45 PM Medical Record Number: [id number] Patient Account Number: [id number] Date of Birth/Sex: August 11, 1935 (84 y.o. M) Treating RN: Cornell Barman Primary Care Provider: [name] Other Clinician: Referring Provider: [name] Treating Provider/Extender: Tito Dine in Treatment: 7 Active Problems ICD-10 Encounter Code Description Active Date MDM Diagnosis [phone] Pressure ulcer of left heel, stage 3 02/11/2020 No Yes G60.3 Idiopathic progressive neuropathy 02/11/2020 No Yes M21.372 Foot drop, left foot 02/11/2020 No Yes M21.371 Foot drop, right foot 02/11/2020 No Yes Inactive Problems Resolved Problems Electronic Signature(s) Signed: 04/03/2020 8:18:14 AM By: Linton Ham MD Entered By: Linton Ham on 04/01/2020 13:09:59 [name] ([id number]) -------------------------------------------------------------------------------- Progress Note Details Patient Name: [name] Date of Service: 04/01/2020 12:45 PM Medical Record Number: [id number] Patient Account Number: [id number] Date of Birth/Sex: [birth year] (84 y.o. M) Treating RN: Cornell Barman Primary Care Provider: [name] Other Clinician: Referring Provider: [name] Treating Provider/Extender: Tito Dine in Treatment: 7 Subjective History of Present Illness (HPI) 02/11/2020 on evaluation today patient appears to be doing somewhat poorly on initial evaluation concerning his heel. He has been using a topical antibiotic ointment which does not seem to really have been doing the best job for him. He tells me that he had a wound that occurred as a result of his padding in the bottom of his shoe wear his foot drop brace goes actually had worn through and his foot was actually rubbing on the  bottom of the shoe on the brace itself. Subsequently this ended up with a wound and he in turn had a lot of issues following. Fortunately there is no signs of active infection at this time which is good news in my opinion. With that being said the wound does appear to be somewhat  moist with some evidence of maceration unfortunately. He has no pain he does have neuropathy. He also has bilateral foot drop which she wears braces. This does appear to be however a pressure injury secondary to the AFO brace. The patient has been seen by podiatry since May 2021 unfortunately this is not healing hence the reason that he is coming to see [place] at this point to see if there is anything we can do to help in this regard. 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 9/15; the patient missed his appointment last week due to illness with his wife who had to be hospitalized. We are using Hydrofera Blue to the small punched out area in the middle of the left plantar heel. 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 This is felt to be pressure from the brace itself. 03/05/2020 upon evaluation today patient appears to be doing well in regard to his wound on the heel. He has been tolerating the dressing changes without complication which is great news. Fortunately there is no signs of active infection which I am very happy with. 03/19/2020 upon evaluation today patient appears to be doing a little worse in regard to his heel based on what I am seeing at this point. There actually appears to be a pocket off to the side laterally that is problematic for him at this point. I am not to clear this way with some sharp debridement currently. The patient was in agreement with that plan. 10/20; patient I do not usually see. He has a small wound on the left plantar heel. Some depth. He is apparently completing antibiotics.  He has bilateral foot drop and therefore is AFO braces limiting what we can do to offload this. Objective Constitutional Sitting or standing Blood Pressure is within target range for patient.. Pulse regular and within target range for patient.Marland Kitchen Respirations regular, non- labored and within target range.. Temperature is normal and within the target range for the patient.Marland Kitchen appears in no distress. Vitals Time Taken: 12:45 PM, Height: 72 in, Weight: 200 lbs, BMI: 27.1, Temperature: 97.7 F, Pulse: 94 bpm, Respiratory Rate: 18 breaths/min, Blood Pressure: 116/62 mmHg. Cardiovascular Pedal pulses are palpable.. General Notes: Wound exam; left plantar heel wound small wound with healthy base slight amount of depth perhaps 2 mm. There is no drainage no surrounding erythema. Integumentary (Hair, Skin) Wound #1 status is Open. Original cause of wound was Trauma. The wound is located on the Left,Plantar Calcaneus. The wound measures 0.5cm length x 0.8cm width x 0.2cm depth; 0.314cm^2 area and 0.063cm^3 volume. There is Fat Layer (Subcutaneous Tissue) exposed. There is no tunneling noted, however, there is undermining starting at 12:00 and ending at 12:00 with a maximum distance of 0.2cm. There is a medium amount of serosanguineous drainage noted. There is large (67-100%) pink, pale granulation within the wound bed. There is no necrotic tissue within the wound bed. Assessment Active Problems Madill, Issak ([id number]) ICD-10 Pressure ulcer of left heel, stage 3 Idiopathic progressive neuropathy Foot drop, left foot Foot drop, right foot Plan Wound Cleansing: Wound #1 Left,Plantar Calcaneus: Dial antibacterial soap, wash wounds, rinse and pat dry prior to dressing wounds Primary Wound Dressing: Wound #1 Left,Plantar Calcaneus: Silver Alginate Secondary Dressing: Wound #1 Left,Plantar Calcaneus: Conform/Kerlix - Heel cup secured by stretch net Dressing Change Frequency: Wound #1 Left,Plantar  Calcaneus: Change dressing every other day. Follow-up Appointments: Wound #  1 Left,Plantar Calcaneus: Return Appointment in 1 week. - Nurse visit to ensure no worsening of the wound - Call [facility] if any complications Return Appointment in 2 weeks. - Provider visit Off-Loading: Wound #1 Left,Plantar Calcaneus: Other: - keep pressure off of heel 1. I did not change him silver alginate 2. He is completing antibiotics I did not see any evidence of infection here. Will allow him to complete his antibiotics as ordered 3. I think this is all an offloading issue. Unfortunately he has gait and balance problems secondary to idiopathic peripheral neuropathy he has bilateral AFO braces for bilateral foot drop. We are using a heel cup Electronic Signature(s) Signed: 04/03/2020 8:18:14 AM By: Linton Ham MD Entered By: Linton Ham on 04/01/2020 13:13:51 Holt, Marcus Pickerel ([id number]) -------------------------------------------------------------------------------- SuperBill Details Patient Name: [name] Date of Service: 04/01/2020 Medical Record Number: [id number] Patient Account Number: [id number] Date of Birth/Sex: 1935/11/06 (84 y.o. M) Treating RN: Cornell Barman Primary Care Provider: [name] Other Clinician: Referring Provider: [name] Treating Provider/Extender: Tito Dine in Treatment: 7 Diagnosis Coding ICD-10 Codes Code Description [phone] Pressure ulcer of left heel, stage 3 G60.3 Idiopathic progressive neuropathy M21.372 Foot drop, left foot M21.371 Foot drop, right foot Facility Procedures CPT4 Code: [id number] Description: [place] VISIT-LEV 3 EST PT Modifier: Quantity: 1 Physician Procedures CPT4 Code: [id number] Description: 32951 - WC PHYS LEVEL 3 - EST PT Modifier: Quantity: 1 CPT4 Code: Description: ICD-10 Diagnosis Description L89.623 Pressure ulcer of left heel, stage 3 G60.3 Idiopathic progressive  neuropathy Modifier: Quantity: Electronic Signature(s) Signed: 04/03/2020 8:18:14 AM By: Linton Ham MD Entered By: Linton Ham on 04/01/2020 13:14:10

## 2020-04-03 NOTE — Progress Notes (Signed)
[name], [place]G ([id number]) Visit Report for 04/01/2020 Arrival Information Details Patient Name: [name], [name] Date of Service: 04/01/2020 12:45 PM Medical Record Number: [id number] Patient Account Number: [id number] Date of Birth/Sex: 09/11/1935 (84 y.o. M) Treating RN: Cornell Barman Primary Care Lasean Rahming: [name] Other Clinician: Referring Jatavion Peaster: [name] Treating King Pinzon/Extender: Tito Dine in Treatment: 7 Visit Information History Since Last Visit Added or deleted any medications: No Patient Arrived: Walker Any new allergies or adverse reactions: No Arrival Time: 12:44 Had a fall or experienced change in No Accompanied By: self activities of daily living that may affect Transfer Assistance: None risk of falls: Patient Identification Verified: Yes Signs or symptoms of abuse/neglect since last visito No Secondary Verification Process Completed: Yes Hospitalized since last visit: No Patient Requires Transmission-Based Precautions: No Implantable device outside of the clinic excluding No Patient Has Alerts: No cellular tissue based products placed in the center since last visit: Has Dressing in Place as Prescribed: Yes Pain Present Now: No Electronic Signature(s) Signed: 04/01/2020 3:57:14 PM By: Lorine Bears RCP, RRT, CHT Entered By: Lorine Bears on 04/01/2020 12:50:20 [name] ([id number]) -------------------------------------------------------------------------------- Clinic Level of Care Assessment Details Patient Name: [name] Date of Service: 04/01/2020 12:45 PM Medical Record Number: [id number] Patient Account Number: [id number] Date of Birth/Sex: May 13, 1936 (84 y.o. M) Treating RN: Cornell Barman Primary Care Ned Kakar: [name] Other Clinician: Referring Matin Mattioli: [name] Treating Mahati Vajda/Extender: Tito Dine in Treatment: 7 Clinic Level of Care  Assessment Items TOOL 4 Quantity Score []  - Use when only an EandM is performed on FOLLOW-UP visit 0 ASSESSMENTS - Nursing Assessment / Reassessment X - Reassessment of Co-morbidities (includes updates in patient status) 1 10 X- 1 5 Reassessment of Adherence to Treatment Plan ASSESSMENTS - Wound and Skin Assessment / Reassessment X - Simple Wound Assessment / Reassessment - one wound 1 5 []  - 0 Complex Wound Assessment / Reassessment - multiple wounds []  - 0 Dermatologic / Skin Assessment (not related to wound area) ASSESSMENTS - Focused Assessment []  - Circumferential Edema Measurements - multi extremities 0 []  - 0 Nutritional Assessment / Counseling / Intervention []  - 0 Lower Extremity Assessment (monofilament, tuning fork, pulses) []  - 0 Peripheral Arterial Disease Assessment (using hand held doppler) ASSESSMENTS - Ostomy and/or Continence Assessment and Care []  - Incontinence Assessment and Management 0 []  - 0 Ostomy Care Assessment and Management (repouching, etc.) PROCESS - Coordination of Care X - Simple Patient / Family Education for ongoing care 1 15 []  - 0 Complex (extensive) Patient / Family Education for ongoing care []  - 0 Staff obtains Programmer, systems, Records, Test Results / Process Orders []  - 0 Staff telephones HHA, Nursing Homes / Clarify orders / etc []  - 0 Routine Transfer to another Facility (non-emergent condition) []  - 0 Routine Hospital Admission (non-emergent condition) []  - 0 New Admissions / Biomedical engineer / Ordering NPWT, Apligraf, etc. []  - 0 Emergency Hospital Admission (emergent condition) X- 1 10 Simple Discharge Coordination []  - 0 Complex (extensive) Discharge Coordination PROCESS - Special Needs []  - Pediatric / Minor Patient Management 0 []  - 0 Isolation Patient Management []  - 0 Hearing / Language / Visual special needs []  - 0 Assessment of Community assistance (transportation, D/C planning, etc.) []  - 0 Additional  assistance / Altered mentation []  - 0 Support Surface(s) Assessment (bed, cushion, seat, etc.) INTERVENTIONS - Wound Cleansing / Measurement Shreffler, Heron ([id number]) X- 1 5 Simple Wound Cleansing - one wound []  - 0 Complex Wound Cleansing -  multiple wounds X- 1 5 Wound Imaging (photographs - any number of wounds) []  - 0 Wound Tracing (instead of photographs) X- 1 5 Simple Wound Measurement - one wound []  - 0 Complex Wound Measurement - multiple wounds INTERVENTIONS - Wound Dressings []  - Small Wound Dressing one or multiple wounds 0 X- 1 15 Medium Wound Dressing one or multiple wounds []  - 0 Large Wound Dressing one or multiple wounds []  - 0 Application of Medications - topical []  - 0 Application of Medications - injection INTERVENTIONS - Miscellaneous []  - External ear exam 0 []  - 0 Specimen Collection (cultures, biopsies, blood, body fluids, etc.) []  - 0 Specimen(s) / Culture(s) sent or taken to Lab for analysis []  - 0 Patient Transfer (multiple staff / Civil Service fast streamer / Similar devices) []  - 0 Simple Staple / Suture removal (25 or less) []  - 0 Complex Staple / Suture removal (26 or more) []  - 0 Hypo / Hyperglycemic Management (close monitor of Blood Glucose) []  - 0 Ankle / Brachial Index (ABI) - do not check if billed separately X- 1 5 Vital Signs Has the patient been seen at the hospital within the last three years: Yes Total Score: 80 Level Of Care: New/Established - Level 3 Electronic Signature(s) Signed: 04/02/2020 5:56:15 PM By: Gretta Cool, BSN, RN, CWS, Kim RN, BSN Entered By: Gretta Cool, BSN, RN, CWS, Kim on 04/01/2020 13:07:40 [name] ([id number]) -------------------------------------------------------------------------------- Encounter Discharge Information Details Patient Name: [name] Date of Service: 04/01/2020 12:45 PM Medical Record Number: [id number] Patient Account Number: [id number] Date of Birth/Sex: 06-07-1936 (84 y.o. M) Treating RN: Cornell Barman Primary Care Ashika Apuzzo: [name] Other Clinician: Referring Ashlye Oviedo: [name] Treating Eulogio Requena/Extender: Tito Dine in Treatment: 7 Encounter Discharge Information Items Discharge Condition: Stable Ambulatory Status: Ambulatory Discharge Destination: Home Transportation: Private Auto Accompanied By: self Schedule Follow-up Appointment: Yes Clinical Summary of Care: Electronic Signature(s) Signed: 04/02/2020 5:56:15 PM By: Gretta Cool, BSN, RN, CWS, Kim RN, BSN Entered By: Gretta Cool, BSN, RN, CWS, Kim on 04/01/2020 13:09:24 [name] ([id number]) -------------------------------------------------------------------------------- Lower Extremity Assessment Details Patient Name: [name] Date of Service: 04/01/2020 12:45 PM Medical Record Number: [id number] Patient Account Number: [id number] Date of Birth/Sex: 04/23/1936 (84 y.o. M) Treating RN: Carlene Coria Primary Care Trinna Kunst: [name] Other Clinician: Referring Ardell Aaronson: [name] Treating Hamsini Verrilli/Extender: Tito Dine in Treatment: 7 Edema Assessment Assessed: [Left: No] [Right: No] [Left: Edema] [Right: :] Calf Left: Right: Point of Measurement: From Medial Instep 42 cm Ankle Left: Right: Point of Measurement: From Medial Instep 26 cm Electronic Signature(s) Signed: 04/01/2020 4:54:21 PM By: Carlene Coria RN Entered By: Carlene Coria on 04/01/2020 12:58:59 Lottes, [name] ([id number]) -------------------------------------------------------------------------------- Multi Wound Chart Details Patient Name: [name] Date of Service: 04/01/2020 12:45 PM Medical Record Number: [id number] Patient Account Number: [id number] Date of Birth/Sex: [birth year] (84 y.o. M) Treating RN: Cornell Barman Primary Care Deveon Kisiel: [name] Other Clinician: Referring Chermaine Schnyder: [name] Treating Sui Kasparek/Extender: Tito Dine in  Treatment: 7 Vital Signs Height(in): 72 Pulse(bpm): 94 Weight(lbs): 200 Blood Pressure(mmHg): 116/62 Body Mass Index(BMI): 27 Temperature(F): 97.7 Respiratory Rate(breaths/min): 18 Photos: [N/A:N/A] Wound Location: Left, Plantar Calcaneus N/A N/A Wounding Event: Trauma N/A N/A Primary Etiology: Trauma, Other N/A N/A Secondary Etiology: Pressure Ulcer N/A N/A Comorbid History: Hypertension, Myocardial Infarction, N/A N/A Osteoarthritis, Neuropathy Date Acquired: 10/21/2019 N/A N/A Weeks of Treatment: 7 N/A N/A Wound Status: Open N/A N/A Measurements L x W x D (cm) 0.5x0.8x0.2 N/A N/A Area (cm) : 0.314 N/A N/A Volume (cm) :  0.063 N/A N/A % Reduction in Area: -60.20% N/A N/A % Reduction in Volume: 20.30% N/A N/A Starting Position 1 (o'clock): 12 Ending Position 1 (o'clock): 12 Maximum Distance 1 (cm): 0.2 Undermining: Yes N/A N/A Classification: Full Thickness Without Exposed N/A N/A Support Structures Exudate Amount: Medium N/A N/A Exudate Type: Serosanguineous N/A N/A Exudate Color: red, brown N/A N/A Granulation Amount: Large (67-100%) N/A N/A Granulation Quality: Pink, Pale N/A N/A Necrotic Amount: None Present (0%) N/A N/A Exposed Structures: Fat Layer (Subcutaneous Tissue): N/A N/A Yes Fascia: No Tendon: No Muscle: No Joint: No Bone: No Epithelialization: None N/A N/A Treatment Notes Wound #1 (Left, Plantar Calcaneus) Notes Puskarich, Jermel ([id number]) S Cell, heel cup secured with stretch net Electronic Signature(s) Signed: 04/03/2020 8:18:14 AM By: Linton Ham MD Entered By: Linton Ham on 04/01/2020 13:10:09 [name] ([id number]) -------------------------------------------------------------------------------- Multi-Disciplinary Care Plan Details Patient Name: [name] Date of Service: 04/01/2020 12:45 PM Medical Record Number: [id number] Patient Account Number: [id number] Date of Birth/Sex: [birth year] (84 y.o. M) Treating RN: Cornell Barman Primary  Care Tekisha Darcey: [name] Other Clinician: Referring Jerry Haugen: [name] Treating Jaquilla Woodroof/Extender: Tito Dine in Treatment: 7 Active Inactive Orientation to the Wound Care Program Nursing Diagnoses: Knowledge deficit related to the wound healing center program Goals: Patient/caregiver will verbalize understanding of the [facility] Program Date Initiated: 02/11/2020 Target Resolution Date: 02/15/2020 Goal Status: Active Interventions: Provide education on orientation to the wound center Notes: Wound/Skin Impairment Nursing Diagnoses: Impaired tissue integrity Knowledge deficit related to ulceration/compromised skin integrity Goals: Patient/caregiver will verbalize understanding of skin care regimen Date Initiated: 02/11/2020 Target Resolution Date: 02/24/2020 Goal Status: Active Interventions: Assess patient/caregiver ability to obtain necessary supplies Assess patient/caregiver ability to perform ulcer/skin care regimen upon admission and as needed Assess ulceration(s) every visit Provide education on ulcer and skin care Treatment Activities: Skin care regimen initiated : 02/11/2020 Notes: Electronic Signature(s) Signed: 04/02/2020 5:56:15 PM By: Gretta Cool, BSN, RN, CWS, Kim RN, BSN Entered By: Gretta Cool, BSN, RN, CWS, Kim on 04/01/2020 13:04:59 [name] ([id number]) -------------------------------------------------------------------------------- Pain Assessment Details Patient Name: [name] Date of Service: 04/01/2020 12:45 PM Medical Record Number: [id number] Patient Account Number: [id number] Date of Birth/Sex: 05/07/1936 (84 y.o. M) Treating RN: Carlene Coria Primary Care Kruti Horacek: [name] Other Clinician: Referring Ovida Delagarza: [name] Treating Jaivon Vanbeek/Extender: Tito Dine in Treatment: 7 Active Problems Location of Pain Severity and Description of Pain Patient Has Paino No Site  Locations Pain Management and Medication Current Pain Management: Electronic Signature(s) Signed: 04/01/2020 4:54:21 PM By: Carlene Coria RN Entered By: Carlene Coria on 04/01/2020 12:56:21 [name] ([id number]) -------------------------------------------------------------------------------- Patient/Caregiver Education Details Patient Name: [name] Date of Service: 04/01/2020 12:45 PM Medical Record Number: [id number] Patient Account Number: [id number] Date of Birth/Gender: [birth year] (84 y.o. M) Treating RN: Cornell Barman Primary Care Physician: [name] Other Clinician: Referring Physician: [name] Treating Physician/Extender: Tito Dine in Treatment: 7 Education Assessment Education Provided To: Patient Education Topics Provided Wound/Skin Impairment: Handouts: Caring for Your Ulcer Methods: Demonstration, Explain/Verbal Responses: State content correctly Electronic Signature(s) Signed: 04/02/2020 5:56:15 PM By: Gretta Cool, BSN, RN, CWS, Kim RN, BSN Entered By: Gretta Cool, BSN, RN, CWS, Kim on 04/01/2020 13:08:46 [name] ([id number]) -------------------------------------------------------------------------------- Wound Assessment Details Patient Name: [name] Date of Service: 04/01/2020 12:45 PM Medical Record Number: [id number] Patient Account Number: [id number] Date of Birth/Sex: June 14, 1935 (84 y.o. M) Treating RN: Carlene Coria Primary Care Rutger Salton: [name] Other Clinician: Referring Shanti Eichel: [name] Treating Dennis Killilea/Extender: Tito Dine in Treatment:  7 Wound Status Wound Number: 1 Primary Etiology: Trauma, Other Wound Location: Left, Plantar Calcaneus Secondary Pressure Ulcer Etiology: Wounding Event: Trauma Wound Status: Open Date Acquired: 10/21/2019 Comorbid Hypertension, Myocardial Infarction, Osteoarthritis, Weeks Of Treatment: 7 History: Neuropathy Clustered Wound:  No Photos Wound Measurements Length: (cm) 0.5 Width: (cm) 0.8 Depth: (cm) 0.2 Area: (cm) 0.314 Volume: (cm) 0.063 % Reduction in Area: -60.2% % Reduction in Volume: 20.3% Epithelialization: None Tunneling: No Undermining: Yes Starting Position (o'clock): 12 Ending Position (o'clock): 12 Maximum Distance: (cm) 0.2 Wound Description Classification: Full Thickness Without Exposed Support Structures Exudate Amount: Medium Exudate Type: Serosanguineous Exudate Color: red, brown Foul Odor After Cleansing: No Slough/Fibrino No Wound Bed Granulation Amount: Large (67-100%) Exposed Structure Granulation Quality: Pink, Pale Fascia Exposed: No Necrotic Amount: None Present (0%) Fat Layer (Subcutaneous Tissue) Exposed: Yes Tendon Exposed: No Muscle Exposed: No Joint Exposed: No Bone Exposed: No Treatment Notes Wound #1 (Left, Plantar Calcaneus) Notes S Cell, heel cup secured with stretch net Phebus, Jceon ([id number]) Electronic Signature(s) Signed: 04/01/2020 4:54:21 PM By: Carlene Coria RN Entered By: Carlene Coria on 04/01/2020 12:58:15 [name] ([id number]) -------------------------------------------------------------------------------- Vitals Details Patient Name: [name] Date of Service: 04/01/2020 12:45 PM Medical Record Number: [id number] Patient Account Number: [id number] Date of Birth/Sex: Mar 13, 1936 (84 y.o. M) Treating RN: Cornell Barman Primary Care Damariz Paganelli: [name] Other Clinician: Referring Shantanique Hodo: [name] Treating Barret Esquivel/Extender: Tito Dine in Treatment: 7 Vital Signs Time Taken: 12:45 Temperature (F): 97.7 Height (in): 72 Pulse (bpm): 94 Weight (lbs): 200 Respiratory Rate (breaths/min): 18 Body Mass Index (BMI): 27.1 Blood Pressure (mmHg): 116/62 Reference Range: 80 - 120 mg / dl Electronic Signature(s) Signed: 04/01/2020 3:57:14 PM By: Lorine Bears RCP, RRT, CHT Entered By: Lorine Bears on 04/01/2020 12:50:49

## 2020-04-03 NOTE — Progress Notes (Signed)
[name] ([id number]) Visit Report for 02/26/2020 Arrival Information Details Patient Name: [name], [name] Date of Service: 02/26/2020 3:15 PM Medical Record Number: [id number] Patient Account Number: [id number] Date of Birth/Sex: 22-Jan-1936 (84 y.o. M) Treating RN: Cornell Barman Primary Care Deola Rewis: [name] Other Clinician: Referring Lillian Tigges: [name] Treating Aylen Rambert/Extender: Tito Dine in Treatment: 2 Visit Information History Since Last Visit All ordered tests and consults were completed: No Patient Arrived: [name] Added or deleted any medications: No Arrival Time: 15:33 Any new allergies or adverse reactions: No Accompanied By: self Had a fall or experienced change in No Transfer Assistance: None activities of daily living that may affect Patient Identification Verified: Yes risk of falls: Secondary Verification Process Completed: Yes Signs or symptoms of abuse/neglect since last visito No Patient Requires Transmission-Based Precautions: No Hospitalized since last visit: No Patient Has Alerts: No Implantable device outside of the clinic excluding No cellular tissue based products placed in the center since last visit: Has Dressing in Place as Prescribed: Yes Has Compression in Place as Prescribed: No Pain Present Now: No Electronic Signature(s) Signed: 02/26/2020 4:35:13 PM By: Darci Needle Entered By: Darci Needle on 02/26/2020 15:34:21 Whiteley, [name] ([id number]) -------------------------------------------------------------------------------- Clinic Level of Care Assessment Details Patient Name: [name] Date of Service: 02/26/2020 3:15 PM Medical Record Number: [id number] Patient Account Number: [id number] Date of Birth/Sex: 01-Apr-1936 (84 y.o. M) Treating RN: Cornell Barman Primary Care Jabree Pernice: [name] Other Clinician: Referring Pinkie Manger: [name] Treating Caidyn Blossom/Extender: Tito Dine in Treatment: 2 Clinic Level of Care Assessment Items TOOL 4 Quantity Score []  - Use when only an EandM is performed on FOLLOW-UP visit 0 ASSESSMENTS - Nursing Assessment / Reassessment X - Reassessment of Co-morbidities (includes updates in patient status) 1 10 X- 1 5 Reassessment of Adherence to Treatment Plan ASSESSMENTS - Wound and Skin Assessment / Reassessment X - Simple Wound Assessment / Reassessment - one wound 1 5 []  - 0 Complex Wound Assessment / Reassessment - multiple wounds []  - 0 Dermatologic / Skin Assessment (not related to wound area) ASSESSMENTS - Focused Assessment []  - Circumferential Edema Measurements - multi extremities 0 []  - 0 Nutritional Assessment / Counseling / Intervention []  - 0 Lower Extremity Assessment (monofilament, tuning fork, pulses) []  - 0 Peripheral Arterial Disease Assessment (using hand held doppler) ASSESSMENTS - Ostomy and/or Continence Assessment and Care []  - Incontinence Assessment and Management 0 []  - 0 Ostomy Care Assessment and Management (repouching, etc.) PROCESS - Coordination of Care X - Simple Patient / Family Education for ongoing care 1 15 []  - 0 Complex (extensive) Patient / Family Education for ongoing care X- 1 10 Staff obtains Programmer, systems, Records, Test Results / Process Orders []  - 0 Staff telephones HHA, Nursing Homes / Clarify orders / etc []  - 0 Routine Transfer to another Facility (non-emergent condition) []  - 0 Routine Hospital Admission (non-emergent condition) []  - 0 New Admissions / Biomedical engineer / Ordering NPWT, Apligraf, etc. []  - 0 Emergency Hospital Admission (emergent condition) X- 1 10 Simple Discharge Coordination []  - 0 Complex (extensive) Discharge Coordination PROCESS - Special Needs []  - Pediatric / Minor Patient Management 0 []  - 0 Isolation Patient Management []  - 0 Hearing / Language / Visual special needs []  - 0 Assessment of Community assistance (transportation,  D/C planning, etc.) []  - 0 Additional assistance / Altered mentation []  - 0 Support Surface(s) Assessment (bed, cushion, seat, etc.) INTERVENTIONS - Wound Cleansing / Measurement Amador, Mikhai ([id number]) X- 1 5 Simple Wound  Cleansing - one wound []  - 0 Complex Wound Cleansing - multiple wounds X- 1 5 Wound Imaging (photographs - any number of wounds) []  - 0 Wound Tracing (instead of photographs) X- 1 5 Simple Wound Measurement - one wound []  - 0 Complex Wound Measurement - multiple wounds INTERVENTIONS - Wound Dressings []  - Small Wound Dressing one or multiple wounds 0 X- 1 15 Medium Wound Dressing one or multiple wounds []  - 0 Large Wound Dressing one or multiple wounds []  - 0 Application of Medications - topical []  - 0 Application of Medications - injection INTERVENTIONS - Miscellaneous []  - External ear exam 0 []  - 0 Specimen Collection (cultures, biopsies, blood, body fluids, etc.) []  - 0 Specimen(s) / Culture(s) sent or taken to Lab for analysis []  - 0 Patient Transfer (multiple staff / Civil Service fast streamer / Similar devices) []  - 0 Simple Staple / Suture removal (25 or less) []  - 0 Complex Staple / Suture removal (26 or more) []  - 0 Hypo / Hyperglycemic Management (close monitor of Blood Glucose) []  - 0 Ankle / Brachial Index (ABI) - do not check if billed separately X- 1 5 Vital Signs Has the patient been seen at the hospital within the last three years: Yes Total Score: 90 Level Of Care: New/Established - Level 3 Electronic Signature(s) Signed: 04/02/2020 6:08:34 PM By: Gretta Cool, BSN, RN, CWS, Kim RN, BSN Entered By: Gretta Cool, BSN, RN, CWS, Kim on 02/26/2020 15:48:38 [name] ([id number]) -------------------------------------------------------------------------------- Encounter Discharge Information Details Patient Name: [name] Date of Service: 02/26/2020 3:15 PM Medical Record Number: [id number] Patient Account Number: [id number] Date of Birth/Sex: Jul 08, 1935 (84  y.o. M) Treating RN: Cornell Barman Primary Care Violetta Lavalle: [name] Other Clinician: Referring Yehia Mcbain: [name] Treating Chayne Baumgart/Extender: Tito Dine in Treatment: 2 Encounter Discharge Information Items Discharge Condition: Stable Ambulatory Status: Walker Discharge Destination: Home Transportation: Private Auto Accompanied By: self Schedule Follow-up Appointment: Yes Clinical Summary of Care: Electronic Signature(s) Signed: 04/02/2020 6:08:34 PM By: Gretta Cool, BSN, RN, CWS, Kim RN, BSN Entered By: Gretta Cool, BSN, RN, CWS, Kim on 02/26/2020 15:49:39 [name] ([id number]) -------------------------------------------------------------------------------- Lower Extremity Assessment Details Patient Name: [name] Date of Service: 02/26/2020 3:15 PM Medical Record Number: [id number] Patient Account Number: [id number] Date of Birth/Sex: April 19, 1936 (84 y.o. M) Treating RN: Cornell Barman Primary Care Miachel Nardelli: [name] Other Clinician: Referring Pahola Dimmitt: [name] Treating Fatumata Kashani/Extender: Tito Dine in Treatment: 2 Edema Assessment Assessed: Shirlyn Goltz: Yes] [Right: No] Edema: [Left: Ye] [Right: s] Calf Left: Right: Point of Measurement: 33 cm From Medial Instep 43 cm cm Ankle Left: Right: Point of Measurement: 13 cm From Medial Instep 29.5 cm cm Vascular Assessment Pulses: Dorsalis Pedis Palpable: [Left:Yes] Posterior Tibial Palpable: [Left:Yes] Electronic Signature(s) Signed: 02/26/2020 4:35:13 PM By: Darci Needle Signed: 04/02/2020 6:08:34 PM By: Gretta Cool, BSN, RN, CWS, Kim RN, BSN Entered By: Darci Needle on 02/26/2020 15:37:25 Slight, [name] ([id number]) -------------------------------------------------------------------------------- Multi Wound Chart Details Patient Name: [name] Date of Service: 02/26/2020 3:15 PM Medical Record Number: [id number] Patient Account Number: [id number] Date of  Birth/Sex: 1936/04/26 (84 y.o. M) Treating RN: Cornell Barman Primary Care Markevion Lattin: [name] Other Clinician: Referring Ingeborg Fite: [name] Treating Alexea Blase/Extender: Tito Dine in Treatment: 2 Vital Signs Height(in): 72 Pulse(bpm): 24 Weight(lbs): 200 Blood Pressure(mmHg): 135/79 Body Mass Index(BMI): 27 Temperature(F): 98.1 Respiratory Rate(breaths/min): 22 Photos: [N/A:N/A] Wound Location: Left, Plantar Calcaneus N/A N/A Wounding Event: Trauma N/A N/A Primary Etiology: Trauma, Other N/A N/A Secondary Etiology: Pressure Ulcer N/A N/A Comorbid History: Hypertension,  Myocardial Infarction, N/A N/A Osteoarthritis, Neuropathy Date Acquired: 10/21/2019 N/A N/A Weeks of Treatment: 2 N/A N/A Wound Status: Open N/A N/A Measurements L x W x D (cm) 0.5x0.5x0.5 N/A N/A Area (cm) : 0.196 N/A N/A Volume (cm) : 0.098 N/A N/A % Reduction in Area: 0.00% N/A N/A % Reduction in Volume: -24.10% N/A N/A Classification: Full Thickness Without Exposed N/A N/A Support Structures Exudate Amount: Large N/A N/A Exudate Type: Sanguinous N/A N/A Exudate Color: red N/A N/A Wound Margin: Flat and Intact N/A N/A Granulation Amount: Medium (34-66%) N/A N/A Granulation Quality: Pink N/A N/A Necrotic Amount: Medium (34-66%) N/A N/A Exposed Structures: Fat Layer (Subcutaneous Tissue): N/A N/A Yes Fascia: No Tendon: No Muscle: No Joint: No Bone: No Epithelialization: None N/A N/A Treatment Notes Wound #1 (Left, Plantar Calcaneus) Notes Hblue, BFD Charlie, Lerone ([id number]) Electronic Signature(s) Signed: 02/27/2020 4:37:14 PM By: Linton Ham MD Entered By: Linton Ham on 02/26/2020 16:18:07 [name] ([id number]) -------------------------------------------------------------------------------- Multi-Disciplinary Care Plan Details Patient Name: [name] Date of Service: 02/26/2020 3:15 PM Medical Record Number: [id number] Patient Account Number:  [id number] Date of Birth/Sex: 1936/02/06 (84 y.o. M) Treating RN: Cornell Barman Primary Care Vidhi Delellis: [name] Other Clinician: Referring Sherrian Nunnelley: [name] Treating Nephtali Docken/Extender: Tito Dine in Treatment: 2 Active Inactive Orientation to the Wound Care Program Nursing Diagnoses: Knowledge deficit related to the wound healing center program Goals: Patient/caregiver will verbalize understanding of the [place] Program Date Initiated: 02/11/2020 Target Resolution Date: 02/15/2020 Goal Status: Active Interventions: Provide education on orientation to the wound center Notes: Wound/Skin Impairment Nursing Diagnoses: Impaired tissue integrity Knowledge deficit related to ulceration/compromised skin integrity Goals: Patient/caregiver will verbalize understanding of skin care regimen Date Initiated: 02/11/2020 Target Resolution Date: 02/24/2020 Goal Status: Active Interventions: Assess patient/caregiver ability to obtain necessary supplies Assess patient/caregiver ability to perform ulcer/skin care regimen upon admission and as needed Assess ulceration(s) every visit Provide education on ulcer and skin care Treatment Activities: Skin care regimen initiated : 02/11/2020 Notes: Electronic Signature(s) Signed: 04/02/2020 6:08:34 PM By: Gretta Cool, BSN, RN, CWS, Kim RN, BSN Entered By: Gretta Cool, BSN, RN, CWS, Kim on 02/26/2020 15:46:17 [name] ([id number]) -------------------------------------------------------------------------------- Pain Assessment Details Patient Name: [name] Date of Service: 02/26/2020 3:15 PM Medical Record Number: [id number] Patient Account Number: [id number] Date of Birth/Sex: February 04, 1936 (84 y.o. M) Treating RN: Cornell Barman Primary Care Eliah Ozawa: [name] Other Clinician: Referring Uzziel Russey: [name] Treating Donald Memoli/Extender: Tito Dine in Treatment: 2 Active  Problems Location of Pain Severity and Description of Pain Patient Has Paino No Site Locations With Dressing Change: No Pain Management and Medication Current Pain Management: Electronic Signature(s) Signed: 02/26/2020 4:35:13 PM By: Darci Needle Signed: 04/02/2020 6:08:34 PM By: Gretta Cool, BSN, RN, CWS, Kim RN, BSN Entered By: Darci Needle on 02/26/2020 15:35:29 [name] ([id number]) -------------------------------------------------------------------------------- Patient/Caregiver Education Details Patient Name: [name] Date of Service: 02/26/2020 3:15 PM Medical Record Number: [id number] Patient Account Number: [id number] Date of Birth/Gender: [birth year] (84 y.o. M) Treating RN: Cornell Barman Primary Care Physician: [name] Other Clinician: Referring Physician: [name] Treating Physician/Extender: Tito Dine in Treatment: 2 Education Assessment Education Provided To: Patient Education Topics Provided Wound/Skin Impairment: Handouts: Caring for Your Ulcer Methods: Demonstration, Explain/Verbal Responses: State content correctly Electronic Signature(s) Signed: 04/02/2020 6:08:34 PM By: Gretta Cool, BSN, RN, CWS, Kim RN, BSN Entered By: Gretta Cool, BSN, RN, CWS, Kim on 02/26/2020 15:48:59 [name] ([id number]) -------------------------------------------------------------------------------- Wound Assessment Details Patient Name: [name] Date of Service: 02/26/2020 3:15 PM Medical Record Number:  [id number] Patient Account Number: [id number] Date of Birth/Sex: 02/09/1936 (84 y.o. M) Treating RN: Cornell Barman Primary Care Juelle Dickmann: [name] Other Clinician: Referring Jann Ra: [name] Treating Javan Gonzaga/Extender: Tito Dine in Treatment: 2 Wound Status Wound Number: 1 Primary Etiology: Trauma, Other Wound Location: Left, Plantar Calcaneus Secondary Pressure Ulcer Etiology: Wounding Event:  Trauma Wound Status: Open Date Acquired: 10/21/2019 Comorbid Hypertension, Myocardial Infarction, Osteoarthritis, Weeks Of Treatment: 2 History: Neuropathy Clustered Wound: No Photos Wound Measurements Length: (cm) 0.5 Width: (cm) 0.5 Depth: (cm) 0.5 Area: (cm) 0.196 Volume: (cm) 0.098 % Reduction in Area: 0% % Reduction in Volume: -24.1% Epithelialization: None Wound Description Classification: Full Thickness Without Exposed Support Structures Wound Margin: Flat and Intact Exudate Amount: Large Exudate Type: Sanguinous Exudate Color: red Foul Odor After Cleansing: No Slough/Fibrino No Wound Bed Granulation Amount: Medium (34-66%) Exposed Structure Granulation Quality: Pink Fascia Exposed: No Necrotic Amount: Medium (34-66%) Fat Layer (Subcutaneous Tissue) Exposed: Yes Necrotic Quality: Adherent Slough Tendon Exposed: No Muscle Exposed: No Joint Exposed: No Bone Exposed: No Electronic Signature(s) Signed: 04/02/2020 6:08:34 PM By: Gretta Cool, BSN, RN, CWS, Kim RN, BSN Entered By: Gretta Cool, BSN, RN, CWS, Kim on 02/26/2020 15:45:46 [name] ([id number]) -------------------------------------------------------------------------------- [place] Details Patient Name: [name] Date of Service: 02/26/2020 3:15 PM Medical Record Number: [id number] Patient Account Number: [id number] Date of Birth/Sex: 1935-11-24 (84 y.o. M) Treating RN: Cornell Barman Primary Care Patryk Conant: [name] Other Clinician: Referring Falon Flinchum: [name] Treating Winry Egnew/Extender: Tito Dine in Treatment: 2 Vital Signs Time Taken: 03:15 Temperature (F): 98.1 Height (in): 72 Pulse (bpm): 89 Weight (lbs): 200 Respiratory Rate (breaths/min): 22 Body Mass Index (BMI): 27.1 Blood Pressure (mmHg): 135/79 Reference Range: 80 - 120 mg / dl Electronic Signature(s) Signed: 02/26/2020 4:35:13 PM By: Darci Needle Entered By: Darci Needle on 02/26/2020 15:35:09

## 2020-04-03 NOTE — Progress Notes (Signed)
[name] ([id number]) Visit Report for 02/26/2020 HPI Details Patient Name: [name], [name] Date of Service: 02/26/2020 3:15 PM Medical Record Number: [id number] Patient Account Number: [id number] Date of Birth/Sex: 1935-11-01 (84 y.o. M) Treating RN: Cornell Barman Primary Care Provider: [name] Other Clinician: Referring Provider: [name] Treating Provider/Extender: Tito Dine in Treatment: 2 History of Present Illness HPI Description: 02/11/2020 on evaluation today patient appears to be doing somewhat poorly on initial evaluation concerning his heel. He has been using a topical antibiotic ointment which does not seem to really have been doing the best job for him. He tells me that he had a wound that occurred as a result of his padding in the bottom of his shoe wear his foot drop brace goes actually had worn through and his foot was actually rubbing on the bottom of the shoe on the brace itself. Subsequently this ended up with a wound and he in turn had a lot of issues following. Fortunately there is no signs of active infection at this time which is good news in my opinion. With that being said the wound does appear to be somewhat moist with some evidence of maceration unfortunately. He has no pain he does have neuropathy. He also has bilateral foot drop which she wears braces. This does appear to be however a pressure injury secondary to the AFO brace. The patient has been seen by podiatry since May 2021 unfortunately this is not healing hence the reason that he is coming to see [place] at this point to see if there is anything we can do to help in this regard. 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 9/15; the patient missed his appointment last week due to illness with his wife who had to be hospitalized. We are using Hydrofera Blue to the small punched out area in the middle  of the left plantar heel. 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 This is felt to be pressure from the brace itself. Electronic Signature(s) Signed: 02/27/2020 4:37:14 PM By: Linton Ham MD Entered By: Linton Ham on 02/26/2020 16:20:04 [name] ([id number]) -------------------------------------------------------------------------------- Physical Exam Details Patient Name: [name] Date of Service: 02/26/2020 3:15 PM Medical Record Number: [id number] Patient Account Number: [id number] Date of Birth/Sex: June 08, 1936 (84 y.o. M) Treating RN: Cornell Barman Primary Care Provider: [name] Other Clinician: Referring Provider: [name] Treating Provider/Extender: Tito Dine in Treatment: 2 Constitutional Sitting or standing Blood Pressure is within target range for patient.. Pulse regular and within target range for patient.Marland Kitchen Respirations regular, non- labored and within target range.. Temperature is normal and within the target range for the patient.Marland Kitchen appears in no distress. Notes Wound exam; small punched out area in the plantar left heel. This is small but with 0.5 cm in depth. I do not think any of this is different from the last time he was here. There is no evidence of surrounding infection no purulent drainage is seen I did not think there is any reason to consider culturing this. This does not go to bone or deep structures Electronic Signature(s) Signed: 02/27/2020 4:37:14 PM By: Linton Ham MD Entered By: Linton Ham on 02/26/2020 16:21:10 [name] ([id number]) -------------------------------------------------------------------------------- Physician Orders Details Patient Name: [name] Date of Service: 02/26/2020 3:15 PM Medical Record Number: [id number] Patient Account Number: [id number] Date of Birth/Sex: February 27, 1936 (84 y.o. M) Treating RN: Cornell Barman  Primary Care Provider: [name] Other Clinician: Referring Provider: [name] Treating Provider/Extender: Tito Dine in Treatment: 2 Verbal / Phone Orders: No Diagnosis Coding Wound Cleansing Wound #1 Left,Plantar Calcaneus o Dial antibacterial soap, wash wounds, rinse and pat dry prior to dressing wounds Primary Wound Dressing Wound #1 Left,Plantar Calcaneus o Hydrafera Blue Ready Transfer - cut to fit into wound bed Secondary Dressing Wound #1 Left,Plantar Calcaneus o Boardered Foam Dressing - Heel Cup Dressing Change Frequency Wound #1 Left,Plantar Calcaneus o Change dressing every other day. Follow-up Appointments Wound #1 Left,Plantar Calcaneus o Return Appointment in 1 week. Electronic Signature(s) Signed: 02/27/2020 4:37:14 PM By: Linton Ham MD Signed: 04/02/2020 6:08:34 PM By: Gretta Cool BSN, RN, CWS, Kim RN, BSN Entered By: Gretta Cool, BSN, RN, CWS, Kim on 02/26/2020 15:47:54 [name] ([id number]) -------------------------------------------------------------------------------- Problem List Details Patient Name: [name] Date of Service: 02/26/2020 3:15 PM Medical Record Number: [id number] Patient Account Number: [id number] Date of Birth/Sex: 1935/08/22 (84 y.o. M) Treating RN: Cornell Barman Primary Care Provider: [name] Other Clinician: Referring Provider: [name] Treating Provider/Extender: Tito Dine in Treatment: 2 Active Problems ICD-10 Encounter Code Description Active Date MDM Diagnosis 9801442494 Pressure ulcer of left heel, stage 3 02/11/2020 No Yes G60.3 Idiopathic progressive neuropathy 02/11/2020 No Yes M21.372 Foot drop, left foot 02/11/2020 No Yes M21.371 Foot drop, right foot 02/11/2020 No Yes Inactive Problems Resolved Problems Electronic Signature(s) Signed: 02/27/2020 4:37:14 PM By: Linton Ham MD Entered By: Linton Ham on 02/26/2020 16:17:59 Ledwith, [name]  ([id number]) -------------------------------------------------------------------------------- Progress Note Details Patient Name: [name] Date of Service: 02/26/2020 3:15 PM Medical Record Number: [id number] Patient Account Number: [id number] Date of Birth/Sex: Nov 15, 1935 (84 y.o. M) Treating RN: Cornell Barman Primary Care Provider: [name] Other Clinician: Referring Provider: [name] Treating Provider/Extender: Tito Dine in Treatment: 2 Subjective History of Present Illness (HPI) 02/11/2020 on evaluation today patient appears to be doing somewhat poorly on initial evaluation concerning his heel. He has been using a topical antibiotic ointment which does not seem to really have been doing the best job for him. He tells me that he had a wound that occurred as a result of his padding in the bottom of his shoe wear his foot drop brace goes actually had worn through and his foot was actually rubbing on the bottom of the shoe on the brace itself. Subsequently this ended up with a wound and he in turn had a lot of issues following. Fortunately there is no signs of active infection at this time which is good news in my opinion. With that being said the wound does appear to be somewhat moist with some evidence of maceration unfortunately. He has no pain he does have neuropathy. He also has bilateral foot drop which she wears braces. This does appear to be however a pressure injury secondary to the AFO brace. The patient has been seen by podiatry since May 2021 unfortunately this is not healing hence the reason that he is coming to see [place] at this point to see if there is anything we can do to help in this regard. 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 9/15; the patient missed his appointment last week due to illness with his wife who had to be hospitalized. We are using  Hydrofera Blue to the small punched out area in the middle of the left plantar heel. The patient  has idiopathic peripheral neuropathy and wears bilateral AFOs. This is felt to be pressure from the brace itself. Objective Constitutional Sitting or standing Blood Pressure is within target range for patient.. Pulse regular and within target range for patient.Marland Kitchen Respirations regular, non- labored and within target range.. Temperature is normal and within the target range for the patient.Marland Kitchen appears in no distress. Vitals Time Taken: 3:15 AM, Height: 72 in, Weight: 200 lbs, BMI: 27.1, Temperature: 98.1 F, Pulse: 89 bpm, Respiratory Rate: 22 breaths/min, Blood Pressure: 135/79 mmHg. General Notes: Wound exam; small punched out area in the plantar left heel. This is small but with 0.5 cm in depth. I do not think any of this is different from the last time he was here. There is no evidence of surrounding infection no purulent drainage is seen I did not think there is any reason to consider culturing this. This does not go to bone or deep structures Integumentary (Hair, Skin) Wound #1 status is Open. Original cause of wound was Trauma. The wound is located on the Left,Plantar Calcaneus. The wound measures 0.5cm length x 0.5cm width x 0.5cm depth; 0.196cm^2 area and 0.098cm^3 volume. There is Fat Layer (Subcutaneous Tissue) exposed. There is a large amount of sanguinous drainage noted. The wound margin is flat and intact. There is medium (34-66%) pink granulation within the wound bed. There is a medium (34-66%) amount of necrotic tissue within the wound bed including Adherent Slough. Assessment Active Problems ICD-10 Pressure ulcer of left heel, stage 3 Idiopathic progressive neuropathy Foot drop, left foot Foot drop, right foot [place], [place] ([id number]) Plan Wound Cleansing: Wound #1 Left,Plantar Calcaneus: Dial antibacterial soap, wash wounds, rinse and pat dry prior to dressing wounds Primary Wound  Dressing: Wound #1 Left,Plantar Calcaneus: Hydrafera Blue Ready Transfer - cut to fit into wound bed Secondary Dressing: Wound #1 Left,Plantar Calcaneus: Boardered Foam Dressing - Heel Cup Dressing Change Frequency: Wound #1 Left,Plantar Calcaneus: Change dressing every other day. Follow-up Appointments: Wound #1 Left,Plantar Calcaneus: Return Appointment in 1 week. 1. I am going to continue the [facility] needs to be cut in the small strips and then a small layer over the top. 2. He has a neighbor changing the dressing 3. Border foam/heel cup 4. Follow-up in 1 week 5. I did not think there was any infection here no cultures were done. This may be a difficult area to close. I do not know that there is a way to adequately offload this beyond what we are already doing. He uses a walker to [employer]) Signed: 02/27/2020 4:37:14 PM By: Linton Ham MD Entered By: Linton Ham on 02/26/2020 16:22:59 Harr, [name] ([id number]) -------------------------------------------------------------------------------- SuperBill Details Patient Name: [name] Date of Service: 02/26/2020 Medical Record Number: [id number] Patient Account Number: [id number] Date of Birth/Sex: 01/20/1936 (84 y.o. M) Treating RN: Cornell Barman Primary Care Provider: [name] Other Clinician: Referring Provider: [name] Treating Provider/Extender: Tito Dine in Treatment: 2 Diagnosis Coding ICD-10 Codes Code Description 6613455123 Pressure ulcer of left heel, stage 3 G60.3 Idiopathic progressive neuropathy M21.372 Foot drop, left foot M21.371 Foot drop, right foot Facility Procedures CPT4 Code: 11941740 Description: 99213 - WOUND CARE VISIT-LEV 3 EST PT Modifier: Quantity: 1 Physician Procedures CPT4 Code: 8144818 Description: 56314 - WC PHYS LEVEL 3 - EST PT Modifier: Quantity: 1 CPT4 Code: Description: ICD-10 Diagnosis Description L89.623  Pressure ulcer of left heel, stage 3 G60.3 Idiopathic progressive neuropathy Modifier: Quantity: Electronic Signature(s) Signed: 02/27/2020 4:37:14 PM By: Linton Ham MD Entered  By: Linton Ham on 02/26/2020 16:41:02

## 2020-04-08 DIAGNOSIS — M21372 Foot drop, left foot: Secondary | ICD-10-CM | POA: Diagnosis not present

## 2020-04-08 DIAGNOSIS — M21371 Foot drop, right foot: Secondary | ICD-10-CM | POA: Diagnosis not present

## 2020-04-08 DIAGNOSIS — R260 Ataxic gait: Secondary | ICD-10-CM | POA: Diagnosis not present

## 2020-04-08 DIAGNOSIS — R2681 Unsteadiness on feet: Secondary | ICD-10-CM | POA: Diagnosis not present

## 2020-04-08 DIAGNOSIS — R262 Difficulty in walking, not elsewhere classified: Secondary | ICD-10-CM | POA: Diagnosis not present

## 2020-04-08 DIAGNOSIS — Z9181 History of falling: Secondary | ICD-10-CM | POA: Diagnosis not present

## 2020-04-10 ENCOUNTER — Other Ambulatory Visit: Payer: Self-pay

## 2020-04-10 DIAGNOSIS — R262 Difficulty in walking, not elsewhere classified: Secondary | ICD-10-CM | POA: Diagnosis not present

## 2020-04-10 DIAGNOSIS — M21371 Foot drop, right foot: Secondary | ICD-10-CM | POA: Diagnosis not present

## 2020-04-10 DIAGNOSIS — Z9181 History of falling: Secondary | ICD-10-CM | POA: Diagnosis not present

## 2020-04-10 DIAGNOSIS — Z885 Allergy status to narcotic agent status: Secondary | ICD-10-CM | POA: Diagnosis not present

## 2020-04-10 DIAGNOSIS — R2681 Unsteadiness on feet: Secondary | ICD-10-CM | POA: Diagnosis not present

## 2020-04-10 DIAGNOSIS — M21372 Foot drop, left foot: Secondary | ICD-10-CM | POA: Diagnosis not present

## 2020-04-10 DIAGNOSIS — L89623 Pressure ulcer of left heel, stage 3: Secondary | ICD-10-CM | POA: Diagnosis not present

## 2020-04-10 DIAGNOSIS — H6123 Impacted cerumen, bilateral: Secondary | ICD-10-CM | POA: Diagnosis not present

## 2020-04-10 DIAGNOSIS — Z888 Allergy status to other drugs, medicaments and biological substances status: Secondary | ICD-10-CM | POA: Diagnosis not present

## 2020-04-10 DIAGNOSIS — G603 Idiopathic progressive neuropathy: Secondary | ICD-10-CM | POA: Diagnosis not present

## 2020-04-10 DIAGNOSIS — R260 Ataxic gait: Secondary | ICD-10-CM | POA: Diagnosis not present

## 2020-04-12 ENCOUNTER — Encounter: Payer: Self-pay | Admitting: Emergency Medicine

## 2020-04-12 ENCOUNTER — Ambulatory Visit
Admission: EM | Admit: 2020-04-12 | Discharge: 2020-04-12 | Disposition: A | Discharge status: Home / Self Care | Payer: Medicare Other

## 2020-04-12 ENCOUNTER — Other Ambulatory Visit: Payer: Self-pay

## 2020-04-12 DIAGNOSIS — Z4800 Encounter for change or removal of nonsurgical wound dressing: Secondary | ICD-10-CM

## 2020-04-12 DIAGNOSIS — L97429 Non-pressure chronic ulcer of left heel and midfoot with unspecified severity: Secondary | ICD-10-CM

## 2020-04-12 NOTE — ED Triage Notes (Signed)
Pt present to [facility] for a dressing change of the left foot. He states his home health nurse took off the weekend.

## 2020-04-12 NOTE — ED Provider Notes (Signed)
[facility]    CSN: [id number] Arrival date & time: 04/12/20  3662      History   Chief Complaint Chief Complaint  Patient presents with  . Dressing Change    HPI [name] is a 84 y.o. male with a chronic ulcer of the left heel.  Patient also has foot drop and ankle-foot orthotics bilaterally.  He presents today for change of dressing. He says that his home nurse has taken the weekend off and was not there to change his dressing.  It was last changed 2 days ago.  He normally uses Silvercel dressing on his ulcer and does have that with him today.  He says he cannot change the dressing himself.  The Silvercel dressing is changed every other day. Patient is being seen at [facility] weekly and recently completed Levaquin and Bactrim for cellulitis of the left leg.  Denies any fever denies any return of leg cellulitis and says he feels better overall.  Patient says he is only here to have his dressing changed today and feels well.  No other complaints or concerns today.  HPI  Past Medical History:  Diagnosis Date  . Arthritis   . Benign essential tremor   . Fracture, femur ([place]) 2012  . GERD (gastroesophageal reflux disease)   . H/O echocardiogram    05/2013  . Hx of colonoscopy    04/22/2013  . Hypercholesterolemia   . Hypertension   . Myocardial infarction ([place])    1987  . Polyneuropathy   . Prostate cancer ([place]) 2004  . Superficial hematoma     Patient Active Problem List   Diagnosis Date Noted  . Osteoarthritis of multiple joints 12/15/2017  . Chronic hyponatremia 03/28/2017  . Abnormality of gait and mobility 01/11/2017  . Cloudy vision 01/11/2017  . Foot drop, bilateral 01/11/2017  . Amputee, great toe, left ([place]) 01/11/2017  . 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 Macrocytic anemia 09/22/2015  . Moderate tricuspid insufficiency 04/21/2015  . History of prostate cancer 04/16/2015  . Idiopathic peripheral neuropathy  04/16/2015  . Essential hypertension 04/16/2015  . GERD (gastroesophageal reflux disease) 04/16/2015  . Arthritis 04/16/2015  . Pedal edema 04/16/2015  . Benign essential tremor 10/30/2014  . Hyperlipidemia, mixed 10/27/2014  . Chronic systolic CHF (congestive heart failure), NYHA class 2 ([place]) 04/23/2014  . Mitral insufficiency 11/05/2013  . CAD (coronary artery disease) 11/01/2013  . Basal cell carcinoma of skin of other parts of face 03/14/2013  . Ankle sprain 09/09/2011  . History of fibula fracture 09/09/2011    Past Surgical History:  Procedure Laterality Date  . BASAL CELL CARCINOMA EXCISION    . COLONOSCOPY  05/01/2009   Positive for colonic polyps  . COLONOSCOPY WITH PROPOFOL N/A 02/24/2016   Procedure: COLONOSCOPY WITH PROPOFOL;  Surgeon: Christene Lye, MD;  Location: [facility] ENDOSCOPY;  Service: Endoscopy;  Laterality: N/A;  . CORONARY ARTERY BYPASS GRAFT  1987   X2  . HERNIA REPAIR  1990  . PROSTATE SURGERY  2004       Home Medications    Prior to Admission medications   Medication Sig Start Date End Date Taking? Authorizing Provider  Alpha Lipoic Acid 200 MG CAPS Take 2 capsules by mouth daily.   Yes [provider]  amLODipine (NORVASC) 2.5 MG tablet TAKE 1 TABLET BY MOUTH  DAILY 02/23/20  Yes Karamalegos, Devonne Doughty, DO  Apoaequorin (PREVAGEN PO) Take by mouth.   Yes  [provider]  aspirin 81 MG tablet Take 81 mg by mouth daily.    Yes [provider]  azelastine (ASTELIN) 0.1 % nasal spray Place 1 spray into both nostrils 2 (two) times daily. Use in each nostril as directed 12/23/19  Yes Karamalegos, Devonne Doughty, DO  B Complex Vitamins (VITAMIN-B COMPLEX PO) Take 1 tablet by mouth daily. Reported on 10/06/2015   Yes [provider]  Biotin 1000 MCG tablet Take 1,000 mcg by mouth daily.   Yes [provider]  Calcium Carb-Cholecalciferol 500-100 MG-UNIT CHEW Chew 500 mg by mouth daily.   Yes [provider]  cyanocobalamin 1000 MCG tablet Take 1,000 mcg by mouth daily.   Yes [provider]  folic acid (FOLVITE) 1 MG tablet Take 1 mg by mouth daily.   Yes [provider]  furosemide (LASIX) 20 MG tablet TAKE 1 TABLET BY MOUTH  DAILY 02/23/20  Yes Karamalegos, Devonne Doughty, DO  gabapentin (NEURONTIN) 600 MG tablet TAKE 2 TABLETS BY MOUTH  TWICE DAILY 02/23/20  Yes Karamalegos, Devonne Doughty, DO  Glucosamine Sulfate 1000 MG CAPS Take 1 capsule by mouth daily.   Yes [provider]  hydrochlorothiazide (HYDRODIURIL) 25 MG tablet Take 1 tablet (25 mg total) by mouth 2 (two) times daily. 09/20/19  Yes Karamalegos, Devonne Doughty, DO  Lecithin 400 MG CAPS Take 400 mg by mouth daily.   Yes [provider]  losartan (COZAAR) 50 MG tablet TAKE 1 TABLET BY MOUTH  DAILY 02/24/20  Yes Karamalegos, Devonne Doughty, DO  Melatonin 3 MG CAPS Take 3 mg by mouth at bedtime.   Yes [provider]  meloxicam (MOBIC) 15 MG tablet Take 1 tablet (15 mg total) by mouth daily as needed for pain. For up to 1-2 weeks, then reduce to less frequent for longer if need 03/06/18  Yes Karamalegos, Alexander J, DO  Omega 3 1000 MG CAPS Take 1,000 mg by mouth daily.   Yes [provider]  omeprazole (PRILOSEC) 20 MG capsule TAKE 1 CAPSULE BY MOUTH  TWICE DAILY BEFORE MEALS 02/23/20  Yes Karamalegos, Devonne Doughty, DO  ondansetron (ZOFRAN-ODT) 8 MG disintegrating tablet Take 1 tablet (8 mg total) by mouth daily as needed for nausea or vomiting. 09/05/19  Yes Karamalegos, Devonne Doughty, DO  potassium chloride SA (KLOR-CON) 20 MEQ tablet Take 1 tablet (20 mEq total) by mouth 2 (two) times daily. 12/19/19  Yes Karamalegos, Devonne Doughty, DO  pravastatin (PRAVACHOL) 80 MG tablet TAKE 1 TABLET BY MOUTH  DAILY 02/23/20  Yes Karamalegos, Devonne Doughty, DO  primidone (MYSOLINE) 50 MG tablet TAKE 1 TABLET BY MOUTH 3  TIMES DAILY 02/24/20  Yes Karamalegos, Devonne Doughty, DO  Selenium 200 MCG CAPS Take 200 mcg  by mouth daily.   Yes [provider]  tretinoin (RETIN-A) 0.05 % cream Apply 1 application topically at bedtime.   Yes [provider]    Family History Family History  Problem Relation Age of Onset  . Heart disease Mother   . Congestive Heart Failure Father     Social History Social History   Tobacco Use  . Smoking status: Never Smoker  . Smokeless tobacco: Never Used  Vaping Use  . Vaping Use: Never used  Substance Use Topics  . Alcohol use: Yes    Alcohol/week: 7.0 standard drinks    Types: 7 Glasses of wine per week    Comment: wine in the evening with dinner  . Drug use: No  Allergies   Codeine, Indocin [indomethacin], Latex, Mupirocin, Plavix [clopidogrel bisulfate], Polysporin [bacitracin-polymyxin b], Shellfish allergy, Tape, Neosporin [neomycin-bacitracin zn-polymyx], and Other   Review of Systems Review of Systems  Constitutional: Negative for fatigue and fever.  Musculoskeletal: Positive for gait problem. Negative for arthralgias and joint swelling.  Skin: Positive for color change and wound.  Neurological: Positive for numbness (chronic peripheral neuropathy). Negative for weakness.     Physical Exam Triage Vital Signs ED Triage Vitals  Enc Vitals Group     BP 04/12/20 0949 124/76     Pulse Rate 04/12/20 0949 87     Resp 04/12/20 0949 18     Temp 04/12/20 0949 98.2 F (36.8 C)     Temp Source 04/12/20 0949 Oral     SpO2 04/12/20 0949 97 %     Weight 04/12/20 0946 199 lb 15.3 oz (90.7 kg)     Height 04/12/20 0946 5\' 11"  (1.803 m)     Head Circumference --      Peak Flow --      Pain Score 04/12/20 0945 0     Pain Loc --      Pain Edu? --      Excl. in [place]? --    No data found.  Updated Vital Signs BP 124/76 (BP Location: Left Arm)   Pulse 87   Temp 98.2 F (36.8 C) (Oral)   Resp 18   Ht 5\' 11"  (1.803 m)   Wt 199 lb 15.3 oz (90.7 kg)   SpO2 97%   BMI 27.89 kg/m      Physical Exam Vitals and nursing note  reviewed.  Constitutional:      General: He is not in acute distress.    Appearance: Normal appearance. He is well-developed. He is not ill-appearing or toxic-appearing.  HENT:     Head: Normocephalic and atraumatic.  Eyes:     General: No scleral icterus.    Conjunctiva/sclera: Conjunctivae normal.  Cardiovascular:     Rate and Rhythm: Normal rate and regular rhythm.     Pulses: Normal pulses.  Pulmonary:     Effort: Pulmonary effort is normal. No respiratory distress.  Musculoskeletal:     Cervical back: Neck supple.  Skin:    General: Skin is warm and dry.     Comments: There is an ulceration of the calcaneus which is not draining any fluid.  Dressing removed and changed at this time.  No tenderness but he does have decreased sensation in the area.  No surrounding erythema   Neurological:     General: No focal deficit present.     Mental Status: He is alert. Mental status is at baseline.     Gait: Gait abnormal (use walker, wide based gait).  Psychiatric:        Mood and Affect: Mood normal.        Behavior: Behavior normal.        Thought Content: Thought content normal.          UC Treatments / Results  Labs (all labs ordered are listed, but only abnormal results are displayed) Labs Reviewed - No data to display  EKG   Radiology No results found.  Procedures Procedures (including critical care time  Wound Care  Date/Time: 03/28/2020 12:56 PM Performed by: Danton Clap, PA-C Authorized by: Danton Clap, PA-C   Consent:    Consent obtained:  Verbal   Consent given by:  Patient   Risks discussed:  Pain and  incomplete drainage   Alternatives discussed:  No treatment, delayed treatment and alternative treatment Anesthesia (see MAR for exact dosages):    Anesthesia method:  None Procedure details:    Indications: open wounds     Wound exploration location: extremity  --left heel   Debridement level: none debrided   Wound surface area (sq cm):   0.2 Dressing:    Dressing: silver cell   Wrapped with: gauze and  [place] 2 inch Post-procedure details:    Patient tolerance of procedure:  Tolerated well, no immediate complications Comments:     Silver cell-Patient's own dressing used. I did clean with saline and Hibaclens before applying new dressing   Medications Ordered in UC Medications - No data to display  Initial Impression / Assessment and Plan / UC Course  I have reviewed the triage vital signs and the nursing notes.  Pertinent labs & imaging results that were available during my care of the patient were reviewed by me and considered in my medical decision making (see chart for details).    Dressing changed with patient's silvercel dressing.  No sign of infection.  Advised to follow-up with wound clinic weekly as he has.  Advised to follow-up with his nurse for routine dressing changes and find a backup in case needed.  ED precautions discussed.  Final Clinical Impressions(s) / UC Diagnoses   Final diagnoses:  Chronic heel ulcer, left, with unspecified severity ([place])  Change or removal of nonsurgical wound dressing   Discharge Instructions   None    ED Prescriptions    None     PDMP not reviewed this encounter.   Danton Clap, PA-C 04/12/20 1328

## 2020-04-14 ENCOUNTER — Encounter: Payer: Medicare Other | Attending: Physician Assistant | Admitting: Physician Assistant

## 2020-04-14 ENCOUNTER — Other Ambulatory Visit: Payer: Self-pay

## 2020-04-14 DIAGNOSIS — Z888 Allergy status to other drugs, medicaments and biological substances status: Secondary | ICD-10-CM | POA: Insufficient documentation

## 2020-04-14 DIAGNOSIS — Z885 Allergy status to narcotic agent status: Secondary | ICD-10-CM | POA: Diagnosis not present

## 2020-04-14 DIAGNOSIS — Z91040 Latex allergy status: Secondary | ICD-10-CM | POA: Insufficient documentation

## 2020-04-14 DIAGNOSIS — M21371 Foot drop, right foot: Secondary | ICD-10-CM | POA: Diagnosis not present

## 2020-04-14 DIAGNOSIS — L97512 Non-pressure chronic ulcer of other part of right foot with fat layer exposed: Secondary | ICD-10-CM | POA: Diagnosis not present

## 2020-04-14 DIAGNOSIS — L89623 Pressure ulcer of left heel, stage 3: Secondary | ICD-10-CM | POA: Insufficient documentation

## 2020-04-14 DIAGNOSIS — M21372 Foot drop, left foot: Secondary | ICD-10-CM | POA: Diagnosis not present

## 2020-04-14 DIAGNOSIS — G603 Idiopathic progressive neuropathy: Secondary | ICD-10-CM | POA: Diagnosis not present

## 2020-04-14 DIAGNOSIS — L97422 Non-pressure chronic ulcer of left heel and midfoot with fat layer exposed: Secondary | ICD-10-CM | POA: Diagnosis not present

## 2020-04-14 NOTE — Progress Notes (Signed)
CLAYTON, BOSSERMAN ([id number]) Visit Report for 04/10/2020 Arrival Information Details Patient Name: [name], [name] Date of Service: 04/10/2020 12:45 PM Medical Record Number: [id number] Patient Account Number: [id number] Date of Birth/Sex: 07-30-1935 (84 y.o. M) Treating RN: Dolan Amen Primary Care Lyriq Jarchow: [name] Other Clinician: Referring Joron Velis: [name] Treating Kayln Garceau/Extender: Skipper Cliche in Treatment: 8 Visit Information History Since Last Visit Pain Present Now: No Patient Arrived: Walker Arrival Time: 12:44 Accompanied By: self Transfer Assistance: None Patient Identification Verified: Yes Secondary Verification Process Completed: Yes Patient Requires Transmission-Based Precautions: No Patient Has Alerts: No Electronic Signature(s) Signed: 04/14/2020 8:59:48 AM By: Georges Mouse, Minus Breeding Entered By: Georges Mouse, Minus Breeding on 04/10/2020 12:45:06

## 2020-04-14 NOTE — Progress Notes (Addendum)
NURI, LARMER ([id number]) Visit Report for 04/14/2020 Chief Complaint Document Details Patient Name: [name], [name] Date of Service: 04/14/2020 11:00 AM Medical Record Number: [id number] Patient Account Number: [id number] Date of Birth/Sex: [birth year] (84 y.o. M) Treating RN: Cornell Barman Primary Care Provider: [name] Other Clinician: Referring Provider: [name] Treating Provider/Extender: Skipper Cliche in Treatment: 9 Information Obtained from: Patient Chief Complaint Pressure ulcer left heel Electronic Signature(s) Signed: 04/14/2020 11:24:27 AM By: Worthy Keeler PA-C Entered By: Worthy Keeler on 04/14/2020 11:24:26 Schabel, [name] ([id number]) -------------------------------------------------------------------------------- Debridement Details Patient Name: [name] Date of Service: 04/14/2020 11:00 AM Medical Record Number: [id number] Patient Account Number: [id number] Date of Birth/Sex: [birth year] (84 y.o. M) Treating RN: Cornell Barman Primary Care Provider: [name] Other Clinician: Referring Provider: [name] Treating Provider/Extender: Skipper Cliche in Treatment: 9 Debridement Performed for Wound #1 Left,Plantar Calcaneus Assessment: Performed By: Physician Tommie Sams., PA-C Debridement Type: Debridement Level of Consciousness (Pre- Awake and Alert procedure): Pre-procedure Verification/Time Out Yes - 11:29 Taken: Total Area Debrided (L x W): 0.3 (cm) x 0.4 (cm) = 0.12 (cm) Tissue and other material Viable, Callus, Slough, Subcutaneous, Slough debrided: Level: Skin/Subcutaneous Tissue Debridement Description: Excisional Instrument: Curette Bleeding: Minimum Hemostasis Achieved: Pressure Response to Treatment: Procedure was tolerated well Level of Consciousness (Post- Awake and Alert procedure): Post Debridement Measurements of Total Wound Length: (cm) 0.3 Width: (cm) 0.4 Depth: (cm) 0.2 Volume: (cm)  0.019 Character of Wound/Ulcer Post Debridement: Stable Post Procedure Diagnosis Same as Pre-procedure Electronic Signature(s) Signed: 04/14/2020 4:38:36 PM By: Worthy Keeler PA-C Signed: 04/15/2020 5:22:48 PM By: Gretta Cool, BSN, RN, CWS, Kim RN, BSN Entered By: Gretta Cool, BSN, RN, CWS, Kim on 04/14/2020 11:31:10 [name] ([id number]) -------------------------------------------------------------------------------- HPI Details Patient Name: [name] Date of Service: 04/14/2020 11:00 AM Medical Record Number: [id number] Patient Account Number: [id number] Date of Birth/Sex: Apr 02, 1936 (84 y.o. M) Treating RN: Cornell Barman Primary Care Provider: [name] Other Clinician: Referring Provider: [name] Treating Provider/Extender: Skipper Cliche in Treatment: 9 History of Present Illness HPI Description: 02/11/2020 on evaluation today patient appears to be doing somewhat poorly on initial evaluation concerning his heel. He has been using a topical antibiotic ointment which does not seem to really have been doing the best job for him. He tells me that he had a wound that occurred as a result of his padding in the bottom of his shoe wear his foot drop brace goes actually had worn through and his foot was actually rubbing on the bottom of the shoe on the brace itself. Subsequently this ended up with a wound and he in turn had a lot of issues following. Fortunately there is no signs of active infection at this time which is good news in my opinion. With that being said the wound does appear to be somewhat moist with some evidence of maceration unfortunately. He has no pain he does have neuropathy. He also has bilateral foot drop which she wears braces. This does appear to be however a pressure injury secondary to the AFO brace. The patient has been seen by podiatry since May 2021 unfortunately this is not healing hence the reason that he is coming to see [place] at this point to see if  there is anything we can do to help in this regard. 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 9/15; the patient missed his appointment last week due to  illness with his wife who had to be hospitalized. We are using Hydrofera Blue to the small punched out area in the middle of the left plantar heel. 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 This is felt to be pressure from the brace itself. 03/05/2020 upon evaluation today patient appears to be doing well in regard to his wound on the heel. He has been tolerating the dressing changes without complication which is great news. Fortunately there is no signs of active infection which I am very happy with. 03/19/2020 upon evaluation today patient appears to be doing a little worse in regard to his heel based on what I am seeing at this point. There actually appears to be a pocket off to the side laterally that is problematic for him at this point. I am not to clear this way with some sharp debridement currently. The patient was in agreement with that plan. 10/20; patient I do not usually see. He has a small wound on the left plantar heel. Some depth. He is apparently completing antibiotics. He has bilateral foot drop and therefore is AFO braces limiting what we can do to offload this. 04/14/2020 upon evaluation today patient's heel actually appears to be doing fairly well. There is no signs of active infection at this time which is great news and overall I feel like he is managing nicely. The only issue he has is that the individual who has been helping change the dressings at home unfortunately has not been available for the past week or so. She is out of town. Nonetheless he has been going to urgent care to have the dressings changed as he is not able to do this on his own and states that his wife is unable to do it for him. Electronic  Signature(s) Signed: 04/14/2020 4:00:11 PM By: Worthy Keeler PA-C Entered By: Worthy Keeler on 04/14/2020 16:00:10 [name] ([id number]) -------------------------------------------------------------------------------- Physical Exam Details Patient Name: [name] Date of Service: 04/14/2020 11:00 AM Medical Record Number: [id number] Patient Account Number: [id number] Date of Birth/Sex: 1935-07-13 (84 y.o. M) Treating RN: Cornell Barman Primary Care Provider: [name] Other Clinician: Referring Provider: [name] Treating Provider/Extender: Skipper Cliche in Treatment: 9 Constitutional Well-nourished and well-hydrated in no acute distress. Respiratory normal breathing without difficulty. Psychiatric this patient is able to make decisions and demonstrates good insight into disease process. Alert and Oriented x 3. pleasant and cooperative. Notes Upon inspection patient's wound bed actually showed signs of good granulation at this time and overall I am extremely pleased with where things stand. There does not appear to be any evidence of systemic infection which is great news. No fevers, chills, nausea, vomiting, or diarrhea. Electronic Signature(s) Signed: 04/14/2020 4:00:34 PM By: Worthy Keeler PA-C Entered By: Worthy Keeler on 04/14/2020 16:00:34 [name] ([id number]) -------------------------------------------------------------------------------- Physician Orders Details Patient Name: [name] Date of Service: 04/14/2020 11:00 AM Medical Record Number: [id number] Patient Account Number: [id number] Date of Birth/Sex: 28-Jun-1935 (84 y.o. 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 Follow-up Appointments Wound #1 Left,Plantar Calcaneus o Return Appointment in 1 week. - Nurse visit to ensure no worsening of the wound o Return Appointment in 2 weeks. - Provider visit Off-Loading Wound #1 Left,Plantar Calcaneus o Other: - keep pressure off of heel Electronic Signature(s) Signed: 04/14/2020 4:38:36 PM By: Worthy Keeler PA-C Signed: 04/15/2020 5:22:48 PM By: Gretta Cool, BSN, RN, CWS, Kim RN, BSN Entered By: Gretta Cool, BSN, RN, CWS, Kim on 04/14/2020 11:32:36 [name] ([id number]) -------------------------------------------------------------------------------- Problem List Details Patient Name: [name] Date of Service: 04/14/2020 11:00 AM Medical Record Number: [id number] Patient Account Number: [id number] Date of Birth/Sex: [birth year] (84 y.o. M) Treating RN: Cornell Barman Primary Care Provider: [name] Other Clinician: Referring Provider: [name] Treating Provider/Extender: Skipper Cliche in Treatment: 9 Active Problems ICD-10 Encounter Code Description Active Date MDM Diagnosis [phone] Pressure ulcer of left heel, stage 3 02/11/2020 No Yes G60.3 Idiopathic progressive neuropathy 02/11/2020 No Yes M21.372 Foot drop, left foot 02/11/2020 No Yes M21.371 Foot drop, right foot 02/11/2020 No Yes Inactive Problems Resolved  Problems Electronic Signature(s) Signed: 04/14/2020 11:24:14 AM By: Worthy Keeler PA-C Entered By: Worthy Keeler on 04/14/2020 11:24:14 Dimperio, [name] ([id number]) -------------------------------------------------------------------------------- Progress Note Details Patient Name: [name] Date of Service: 04/14/2020 11:00 AM Medical Record Number: [id number] Patient Account Number: [id number] Date of Birth/Sex: [birth year] (84 y.o. M) Treating RN: Cornell Barman Primary Care Provider: [name] Other Clinician: Referring Provider: [name] Treating Provider/Extender: Skipper Cliche in Treatment: 9 Subjective Chief Complaint Information obtained from Patient Pressure ulcer left heel History of Present Illness (HPI) 02/11/2020 on evaluation today patient appears to be doing somewhat poorly on initial evaluation concerning his heel. He has been using a topical antibiotic ointment which does not seem to really have been doing the best job for him. He tells me that he had a wound that occurred as a result of his padding in the bottom of his shoe wear his foot drop brace goes actually had worn through and his foot was actually rubbing on the bottom of the shoe on the brace itself. Subsequently this ended up with a wound and he in turn had a lot of issues following. Fortunately there is no signs of active infection at this time which is good news in my opinion. With that being said the wound does appear to be somewhat moist with some evidence of maceration unfortunately. He has no pain he does have neuropathy. He also has bilateral foot drop which she wears braces. This does appear to be however a pressure injury secondary to the AFO brace. The patient has been seen by podiatry since May 2021 unfortunately this is not healing hence the reason that he is coming to see [place] at this point to see if there is anything we can do to help in this regard. He does have a left first toe  amputation and wears padding on the second toe in order to help prevent this from breaking down. 9/15; the patient missed his appointment last week due to illness with his wife who had to be hospitalized. We are using Hydrofera Blue to the small punched out area in the middle of the left plantar heel. The patient has idiopathic peripheral neuropathy and wears  bilateral AFOs. This is felt to be pressure from the brace itself. 03/05/2020 upon evaluation today patient appears to be doing well in regard to his wound on the heel. He has been tolerating the dressing changes without complication which is great news. Fortunately there is no signs of active infection which I am very happy with. 03/19/2020 upon evaluation today patient appears to be doing a little worse in regard to his heel based on what I am seeing at this point. There actually appears to be a pocket off to the side laterally that is problematic for him at this point. I am not to clear this way with some sharp debridement currently. The patient was in agreement with that plan. 10/20; patient I do not usually see. He has a small wound on the left plantar heel. Some depth. He is apparently completing antibiotics. He has bilateral foot drop and therefore is AFO braces limiting what we can do to offload this. 04/14/2020 upon evaluation today patient's heel actually appears to be doing fairly well. There is no signs of active infection at this time which is great news and overall I feel like he is managing nicely. The only issue he has is that the individual who has been helping change the dressings at home unfortunately has not been available for the past week or so. She is out of town. Nonetheless he has been going to urgent care to have the dressings changed as he is not able to do this on his own and states that his wife is unable to do it for him. Objective Constitutional Well-nourished and well-hydrated in no acute distress. Vitals Time  Taken: 11:08 AM, Height: 72 in, Weight: 200 lbs, BMI: 27.1, Temperature: 98.2 F, Pulse: 94 bpm, Respiratory Rate: 18 breaths/min, Blood Pressure: 149/80 mmHg. Respiratory normal breathing without difficulty. Psychiatric this patient is able to make decisions and demonstrates good insight into disease process. Alert and Oriented x 3. pleasant and cooperative. General Notes: Upon inspection patient's wound bed actually showed signs of good granulation at this time and overall I am extremely pleased with where things stand. There does not appear to be any evidence of systemic infection which is great news. No fevers, chills, nausea, vomiting, or diarrhea. Integumentary (Hair, Skin) Wound #1 status is Open. Original cause of wound was Trauma. The wound is located on the Left,Plantar Calcaneus. The wound measures 0.3cm Tomlinson, Faith ([id number]) length x 0.4cm width x 0.2cm depth; 0.094cm^2 area and 0.019cm^3 volume. There is Fat Layer (Subcutaneous Tissue) exposed. There is no tunneling noted, however, there is undermining starting at 10:00 and ending at 2:00 with a maximum distance of 0.4cm. There is a medium amount of serosanguineous drainage noted. The wound margin is flat and intact. There is large (67-100%) pink, pale granulation within the wound bed. There is a small (1-33%) amount of necrotic tissue within the wound bed including Adherent Slough. General Notes: Patient has very dry flaky skin. Assessment Active Problems ICD-10 Pressure ulcer of left heel, stage 3 Idiopathic progressive neuropathy Foot drop, left foot Foot drop, right foot Procedures Wound #1 Pre-procedure diagnosis of Wound #1 is a Trauma, Other located on the Left,Plantar Calcaneus . There was a Excisional Skin/Subcutaneous Tissue Debridement with a total area of 0.12 sq cm performed by Tommie Sams., PA-C. With the following instrument(s): Curette to remove Viable tissue/material. Material removed includes Callus,  Subcutaneous Tissue, and Slough. No specimens were taken. A time out was conducted at 11:29, prior to the start of  the procedure. A Minimum amount of bleeding was controlled with Pressure. The procedure was tolerated well. Post Debridement Measurements: 0.3cm length x 0.4cm width x 0.2cm depth; 0.019cm^3 volume. Character of Wound/Ulcer Post Debridement is stable. Post procedure Diagnosis Wound #1: Same as Pre-Procedure Plan Wound Cleansing: Wound #1 Left,Plantar Calcaneus: Dial antibacterial soap, wash wounds, rinse and pat dry prior to dressing wounds Skin Barriers/Peri-Wound Care: Wound #1 Left,Plantar Calcaneus: Moisturizing lotion - Twice daily Primary Wound Dressing: Wound #1 Left,Plantar Calcaneus: Silver Alginate Secondary Dressing: Wound #1 Left,Plantar Calcaneus: Conform/Kerlix - Heel cup secured by stretch net Dressing Change Frequency: Wound #1 Left,Plantar Calcaneus: Change dressing every other day. Follow-up Appointments: Wound #1 Left,Plantar Calcaneus: Return Appointment in 1 week. - Nurse visit to ensure no worsening of the wound Return Appointment in 2 weeks. - Provider visit Off-Loading: Wound #1 Left,Plantar Calcaneus: Other: - keep pressure off of heel 1. I would recommend at this time that we actually go ahead and continue with the wound care measures as before using a silver alginate dressing along with a heel cup to try to keep things under control here. 2. I am also can recommend that the patient continue to monitor for any signs of infection. There was some dry skin and has been using the urea cream which I think is good. With that being said some of the callus I did trim away around the edges of the wound as well to try to help in this regard. 3. I am also can recommend that the patient continue to try to keep pressure off of his heel is much as possible obviously the more this he can do the better off he will be. Leija, [name] ([id number]) We will see  patient back for reevaluation in 2 weeks here in the clinic. If anything worsens or changes patient will contact our office for additional recommendations. Electronic Signature(s) Signed: 04/14/2020 4:01:14 PM By: Worthy Keeler PA-C Entered By: Worthy Keeler on 04/14/2020 16:01:13 Posch, [name] ([id number]) -------------------------------------------------------------------------------- SuperBill Details Patient Name: [name] Date of Service: 04/14/2020 Medical Record Number: [id number] Patient Account Number: [id number] Date of Birth/Sex: 07-Aug-1935 (84 y.o. 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 Foot drop, left foot M21.371 Foot drop, right foot Facility Procedures CPT4 Code: [id number] Description: 65993 - DEB SUBQ TISSUE 20 SQ CM/< Modifier: Quantity: 1 CPT4 Code: Description: ICD-10 Diagnosis Description L89.623 Pressure ulcer of left heel, stage 3 Modifier: Quantity: Physician Procedures CPT4 Code: [id number] Description: 11042 - WC PHYS SUBQ TISS 20 SQ CM Modifier: Quantity: 1 CPT4 Code: Description: ICD-10 Diagnosis Description L89.623 Pressure ulcer of left heel, stage 3 Modifier: Quantity: Electronic Signature(s) Signed: 04/14/2020 4:01:25 PM By: Worthy Keeler PA-C Entered By: Worthy Keeler on 04/14/2020 16:01:24

## 2020-04-15 DIAGNOSIS — R2681 Unsteadiness on feet: Secondary | ICD-10-CM | POA: Diagnosis not present

## 2020-04-15 DIAGNOSIS — R260 Ataxic gait: Secondary | ICD-10-CM | POA: Diagnosis not present

## 2020-04-15 DIAGNOSIS — Z9181 History of falling: Secondary | ICD-10-CM | POA: Diagnosis not present

## 2020-04-15 DIAGNOSIS — R262 Difficulty in walking, not elsewhere classified: Secondary | ICD-10-CM | POA: Diagnosis not present

## 2020-04-15 DIAGNOSIS — M21372 Foot drop, left foot: Secondary | ICD-10-CM | POA: Diagnosis not present

## 2020-04-15 DIAGNOSIS — M21371 Foot drop, right foot: Secondary | ICD-10-CM | POA: Diagnosis not present

## 2020-04-15 NOTE — Progress Notes (Signed)
[name], [name] ([id number]) Visit Report for 04/14/2020 Arrival Information Details Patient Name: [name], [name] Date of Service: 04/14/2020 11:00 AM Medical Record Number: [id number] Patient Account Number: [id number] Date of Birth/Sex: 1935-11-27 (84 y.o. M) Treating RN: Cornell Barman Primary Care Hafiz Irion: [name] Other Clinician: Referring Jensine Luz: [name] Treating Kamel Haven/Extender: Skipper Cliche in Treatment: 9 Visit Information History Since Last Visit Added or deleted any medications: No Patient Arrived: Walker Has Dressing in Place as Prescribed: Yes Arrival Time: 11:07 Pain Present Now: No Accompanied By: self Transfer Assistance: None Patient Identification Verified: Yes Secondary Verification Process Completed: Yes Patient Requires Transmission-Based Precautions: No Patient Has Alerts: No Electronic Signature(s) Signed: 04/15/2020 5:22:48 PM By: Gretta Cool, BSN, RN, CWS, Kim RN, BSN Entered By: Gretta Cool, BSN, RN, CWS, Kim on 04/14/2020 11:08:51 [name] ([id number]) -------------------------------------------------------------------------------- Encounter Discharge Information Details Patient Name: [name] Date of Service: 04/14/2020 11:00 AM Medical Record Number: [id number] Patient Account Number: [id number] Date of Birth/Sex: [birth year] (84 y.o. M) Treating RN: Cornell Barman Primary Care Jynesis Nakamura: [name] Other Clinician: Referring Londa Mackowski: [name] Treating Calee Nugent/Extender: Skipper Cliche in Treatment: 9 Encounter Discharge Information Items Post Procedure Vitals Discharge Condition: Stable Unable to obtain vitals Reason: patient request Ambulatory Status: Ambulatory Discharge Destination: Home Transportation: Private Auto Accompanied By: self Schedule Follow-up Appointment: Yes Clinical Summary of Care: Electronic Signature(s) Signed: 04/15/2020 5:22:48 PM By: Gretta Cool, BSN, RN, CWS, Kim RN, BSN Entered By:  Gretta Cool, BSN, RN, CWS, Kim on 04/14/2020 11:42:54 [name] ([id number]) -------------------------------------------------------------------------------- Lower Extremity Assessment Details Patient Name: [name] Date of Service: 04/14/2020 11:00 AM Medical Record Number: [id number] Patient Account Number: [id number] Date of Birth/Sex: September 24, 1935 (84 y.o. M) Treating RN: Cornell Barman Primary Care Oluwademilade Kellett: [name] Other Clinician: Referring Cyndy Braver: [name] Treating Maquita Sandoval/Extender: Skipper Cliche in Treatment: 9 Edema Assessment Assessed: Shirlyn Goltz: Yes] Patrice Paradise: No] [Left: Edema] [Right: :] Vascular Assessment Pulses: Dorsalis Pedis Palpable: [Left:Yes] Electronic Signature(s) Signed: 04/15/2020 5:22:48 PM By: Gretta Cool, BSN, RN, CWS, Kim RN, BSN Entered By: Gretta Cool, BSN, RN, CWS, Kim on 04/14/2020 11:19:14 [name] ([id number]) -------------------------------------------------------------------------------- Multi Wound Chart Details Patient Name: [name] Date of Service: 04/14/2020 11:00 AM Medical Record Number: [id number] Patient Account Number: [id number] Date of Birth/Sex: [birth year] (84 y.o. M) Treating RN: Cornell Barman Primary Care Nayely Dingus: [name] Other Clinician: Referring Ashrita Chrismer: [name] Treating Erhardt Dada/Extender: Skipper Cliche in Treatment: 9 Vital Signs Height(in): 72 Pulse(bpm): 74 Weight(lbs): 200 Blood Pressure(mmHg): 149/80 Body Mass Index(BMI): 27 Temperature(F): 98.2 Respiratory Rate(breaths/min): 18 Photos: [N/A:N/A] Wound Location: Left, Plantar Calcaneus N/A N/A Wounding Event: Trauma N/A N/A Primary Etiology: Trauma, Other N/A N/A Secondary Etiology: Pressure Ulcer N/A N/A Comorbid History: Hypertension, Myocardial Infarction, N/A N/A Osteoarthritis, Neuropathy Date Acquired: 10/21/2019 N/A N/A Weeks of Treatment: 9 N/A N/A Wound Status: Open N/A N/A Measurements L x W x D (cm)  0.3x0.4x0.2 N/A N/A Area (cm) : 0.094 N/A N/A Volume (cm) : 0.019 N/A N/A % Reduction in Area: 52.00% N/A N/A % Reduction in Volume: 75.90% N/A N/A Starting Position 1 (o'clock): 10 Ending Position 1 (o'clock): 2 Maximum Distance 1 (cm): 0.4 Undermining: Yes N/A N/A Classification: Full Thickness Without Exposed N/A N/A Support Structures Exudate Amount: Medium N/A N/A Exudate Type: Serosanguineous N/A N/A Exudate Color: red, brown N/A N/A Wound Margin: Flat and Intact N/A N/A Granulation Amount: Large (67-100%) N/A N/A Granulation Quality: Pink, Pale N/A N/A Necrotic Amount: Small (1-33%) N/A N/A Exposed Structures: Fat Layer (Subcutaneous Tissue): N/A N/A Yes Fascia: No Tendon: No Muscle: No Joint:  No Bone: No Epithelialization: None N/A N/A Debridement: Debridement - Excisional N/A N/A Pre-procedure Verification/Time 11:29 N/A N/A Out Taken: Tissue Debrided: Callus, Subcutaneous, Slough N/A N/A Level: Skin/Subcutaneous Tissue N/A N/A Debridement Area (sq cm): 0.12 N/A N/A Instrument: Curette N/A N/A [name], [name] ([id number]) Bleeding: Minimum N/A N/A Hemostasis Achieved: Pressure N/A N/A Debridement Treatment Procedure was tolerated well N/A N/A Response: Post Debridement 0.3x0.4x0.2 N/A N/A Measurements L x W x D (cm) Post Debridement Volume: 0.019 N/A N/A (cm) Assessment Notes: Patient has very dry flaky skin. N/A N/A Procedures Performed: Debridement N/A N/A Treatment Notes Electronic Signature(s) Signed: 04/15/2020 5:22:48 PM By: Gretta Cool, BSN, RN, CWS, Kim RN, BSN Entered By: Gretta Cool, BSN, RN, CWS, Kim on 04/14/2020 11:41:35 [name] ([id number]) -------------------------------------------------------------------------------- Multi-Disciplinary Care Plan Details Patient Name: [name] Date of Service: 04/14/2020 11:00 AM Medical Record Number: [id number] Patient Account Number: [id number] Date of Birth/Sex: May 09, 1936 (84 y.o. M) Treating RN: Cornell Barman Primary Care Levern Pitter: [name] Other Clinician: Referring Roan Sawchuk: [name] Treating Samyukta Cura/Extender: Skipper Cliche in Treatment: 9 Active Inactive Necrotic Tissue Nursing Diagnoses: Impaired tissue integrity related to necrotic/devitalized tissue Knowledge deficit related to management of necrotic/devitalized tissue Goals: Necrotic/devitalized tissue will be minimized in the wound bed Date Initiated: 04/14/2020 Target Resolution Date: 05/14/2020 Goal Status: Active Patient/caregiver will verbalize understanding of reason and process for debridement of necrotic tissue Date Initiated: 04/14/2020 Target Resolution Date: 05/14/2020 Goal Status: Active Interventions: Assess patient pain level pre-, during and post procedure and prior to discharge Provide education on necrotic tissue and debridement process Treatment Activities: Apply topical anesthetic as ordered : 04/14/2020 Excisional debridement : 04/14/2020 Notes: Orientation to the Wound Care Program Nursing Diagnoses: Knowledge deficit related to the wound healing center program Goals: Patient/caregiver will verbalize understanding of the [facility] Date Initiated: 02/11/2020 Target Resolution Date: 02/15/2020 Goal Status: Active Interventions: Provide education on orientation to the wound center Notes: Wound/Skin Impairment Nursing Diagnoses: Impaired tissue integrity Knowledge deficit related to ulceration/compromised skin integrity Goals: Patient/caregiver will verbalize understanding of skin care regimen Date Initiated: 02/11/2020 Target Resolution Date: 02/24/2020 Goal Status: Active Interventions: Assess patient/caregiver ability to obtain necessary supplies Assess patient/caregiver ability to perform ulcer/skin care regimen upon admission and as needed Assess ulceration(s) every visit Mantz, [name] ([id number]) Provide education on ulcer and skin care Treatment  Activities: Skin care regimen initiated : 02/11/2020 Notes: Electronic Signature(s) Signed: 04/15/2020 5:22:48 PM By: Gretta Cool, BSN, RN, CWS, Kim RN, BSN Entered By: Gretta Cool, BSN, RN, CWS, Kim on 04/14/2020 11:41:21 [name], [name] ([id number]) -------------------------------------------------------------------------------- Pain Assessment Details Patient Name: [name] Date of Service: 04/14/2020 11:00 AM Medical Record Number: [id number] Patient Account Number: [id number] Date of Birth/Sex: January 19, 1936 (84 y.o. M) Treating RN: Cornell Barman Primary Care Iliya Spivack: [name] Other Clinician: Referring Meela Wareing: [name] Treating Jeydan Barner/Extender: Skipper Cliche in Treatment: 9 Active Problems Location of Pain Severity and Description of Pain Patient Has Paino No Site Locations Pain Management and Medication Current Pain Management: Electronic Signature(s) Signed: 04/15/2020 5:22:48 PM By: Gretta Cool, BSN, RN, CWS, Kim RN, BSN Entered By: Gretta Cool, BSN, RN, CWS, Kim on 04/14/2020 11:10:54 [name] ([id number]) -------------------------------------------------------------------------------- Patient/Caregiver Education Details Patient Name: [name] Date of Service: 04/14/2020 11:00 AM Medical Record Number: [id number] Patient Account Number: [id number] Date of Birth/Gender: 1936/04/24 (84 y.o. M) Treating RN: Cornell Barman Primary Care Physician: [name] Other Clinician: Referring Physician: [name] Treating Physician/Extender: Skipper Cliche in Treatment: 9 Education Assessment Education Provided To: Patient Education Topics Provided Wound Debridement: Handouts:  Wound Debridement Methods: Demonstration, Explain/Verbal Responses: State content correctly Electronic Signature(s) Signed: 04/15/2020 5:22:48 PM By: Gretta Cool, BSN, RN, CWS, Kim RN, BSN Entered By: Gretta Cool, BSN, RN, CWS, Kim on 04/14/2020 11:33:00 [name]  ([id number]) -------------------------------------------------------------------------------- Wound Assessment Details Patient Name: [name] Date of Service: 04/14/2020 11:00 AM Medical Record Number: [id number] Patient Account Number: [id number] Date of Birth/Sex: Jul 18, 1935 (84 y.o. M) Treating RN: Cornell Barman Primary Care Angalina Ante: [name] Other Clinician: Referring Donicia Druck: [name] Treating Candice Tobey/Extender: Skipper Cliche in Treatment: 9 Wound Status Wound Number: 1 Primary Etiology: Trauma, Other Wound Location: Left, Plantar Calcaneus Secondary Pressure Ulcer Etiology: Wounding Event: Trauma Wound Status: Open Date Acquired: 10/21/2019 Comorbid Hypertension, Myocardial Infarction, Osteoarthritis, Weeks Of Treatment: 9 History: Neuropathy Clustered Wound: No Photos Wound Measurements Length: (cm) 0.3 Width: (cm) 0.4 Depth: (cm) 0.2 Area: (cm) 0.094 Volume: (cm) 0.019 % Reduction in Area: 52% % Reduction in Volume: 75.9% Epithelialization: None Tunneling: No Undermining: Yes Starting Position (o'clock): 10 Ending Position (o'clock): 2 Maximum Distance: (cm) 0.4 Wound Description Classification: Full Thickness Without Exposed Support Structures Wound Margin: Flat and Intact Exudate Amount: Medium Exudate Type: Serosanguineous Exudate Color: red, brown Foul Odor After Cleansing: No Slough/Fibrino No Wound Bed Granulation Amount: Large (67-100%) Exposed Structure Granulation Quality: Pink, Pale Fascia Exposed: No Necrotic Amount: Small (1-33%) Fat Layer (Subcutaneous Tissue) Exposed: Yes Necrotic Quality: Adherent Slough Tendon Exposed: No Muscle Exposed: No Joint Exposed: No Bone Exposed: No Assessment Notes Patient has very dry flaky skin. Treatment Notes [name], [name] ([id number]) Wound #1 (Left, Plantar Calcaneus) Notes S Cell, heel cup secured with stretch net Electronic Signature(s) Signed: 04/15/2020 5:22:48 PM  By: Gretta Cool, BSN, RN, CWS, Kim RN, BSN Entered By: Gretta Cool, BSN, RN, CWS, Kim on 04/14/2020 11:22:13 [name] ([id number]) -------------------------------------------------------------------------------- Vitals Details Patient Name: [name] Date of Service: 04/14/2020 11:00 AM Medical Record Number: [id number] Patient Account Number: [id number] Date of Birth/Sex: [birth year] (84 y.o. M) Treating RN: Cornell Barman Primary Care Xianna Siverling: [name] Other Clinician: Referring Jahni Nazar: [name] Treating Prabhjot Piscitello/Extender: Skipper Cliche in Treatment: 9 Vital Signs Time Taken: 11:08 Temperature (F): 98.2 Height (in): 72 Pulse (bpm): 94 Weight (lbs): 200 Respiratory Rate (breaths/min): 18 Body Mass Index (BMI): 27.1 Blood Pressure (mmHg): 149/80 Reference Range: 80 - 120 mg / dl Electronic Signature(s) Signed: 04/15/2020 5:22:48 PM By: Gretta Cool, BSN, RN, CWS, Kim RN, BSN Entered By: Gretta Cool, BSN, RN, CWS, Kim on 04/14/2020 11:10:36

## 2020-04-17 DIAGNOSIS — Z9181 History of falling: Secondary | ICD-10-CM | POA: Diagnosis not present

## 2020-04-17 DIAGNOSIS — M21372 Foot drop, left foot: Secondary | ICD-10-CM | POA: Diagnosis not present

## 2020-04-17 DIAGNOSIS — R2681 Unsteadiness on feet: Secondary | ICD-10-CM | POA: Diagnosis not present

## 2020-04-17 DIAGNOSIS — R262 Difficulty in walking, not elsewhere classified: Secondary | ICD-10-CM | POA: Diagnosis not present

## 2020-04-17 DIAGNOSIS — M21371 Foot drop, right foot: Secondary | ICD-10-CM | POA: Diagnosis not present

## 2020-04-17 DIAGNOSIS — R260 Ataxic gait: Secondary | ICD-10-CM | POA: Diagnosis not present

## 2020-04-21 ENCOUNTER — Telehealth: Payer: Self-pay | Admitting: Family Medicine

## 2020-04-21 NOTE — Telephone Encounter (Signed)
As per patient though had insect bite --taken benadryl not improving redness on top of left knee which is now getting bigger, tender touch and felt warm onset yesterday please suggest ?

## 2020-04-22 ENCOUNTER — Inpatient Hospital Stay
Admission: EM | Admit: 2020-04-22 | Discharge: 2020-04-25 | DRG: 872 | Disposition: A | Discharge status: Home Health | Payer: Medicare Other | Attending: Internal Medicine | Admitting: Internal Medicine

## 2020-04-22 ENCOUNTER — Emergency Department: Payer: Medicare Other

## 2020-04-22 ENCOUNTER — Other Ambulatory Visit: Payer: Self-pay

## 2020-04-22 ENCOUNTER — Encounter: Payer: Self-pay | Admitting: Emergency Medicine

## 2020-04-22 ENCOUNTER — Encounter: Payer: Medicare Other | Admitting: Internal Medicine

## 2020-04-22 ENCOUNTER — Ambulatory Visit: Payer: Medicare Other | Admitting: Family Medicine

## 2020-04-22 DIAGNOSIS — Z91013 Allergy to seafood: Secondary | ICD-10-CM

## 2020-04-22 DIAGNOSIS — L97429 Non-pressure chronic ulcer of left heel and midfoot with unspecified severity: Secondary | ICD-10-CM | POA: Diagnosis present

## 2020-04-22 DIAGNOSIS — Z91048 Other nonmedicinal substance allergy status: Secondary | ICD-10-CM

## 2020-04-22 DIAGNOSIS — E871 Hypo-osmolality and hyponatremia: Secondary | ICD-10-CM | POA: Diagnosis not present

## 2020-04-22 DIAGNOSIS — Z885 Allergy status to narcotic agent status: Secondary | ICD-10-CM

## 2020-04-22 DIAGNOSIS — I252 Old myocardial infarction: Secondary | ICD-10-CM | POA: Diagnosis not present

## 2020-04-22 DIAGNOSIS — Z86010 Personal history of colonic polyps: Secondary | ICD-10-CM | POA: Diagnosis not present

## 2020-04-22 DIAGNOSIS — T502X5A Adverse effect of carbonic-anhydrase inhibitors, benzothiadiazides and other diuretics, initial encounter: Secondary | ICD-10-CM | POA: Diagnosis present

## 2020-04-22 DIAGNOSIS — A419 Sepsis, unspecified organism: Secondary | ICD-10-CM | POA: Diagnosis present

## 2020-04-22 DIAGNOSIS — Z91040 Latex allergy status: Secondary | ICD-10-CM

## 2020-04-22 DIAGNOSIS — I2510 Atherosclerotic heart disease of native coronary artery without angina pectoris: Secondary | ICD-10-CM | POA: Diagnosis present

## 2020-04-22 DIAGNOSIS — I110 Hypertensive heart disease with heart failure: Secondary | ICD-10-CM | POA: Diagnosis present

## 2020-04-22 DIAGNOSIS — Z20822 Contact with and (suspected) exposure to covid-19: Secondary | ICD-10-CM | POA: Diagnosis present

## 2020-04-22 DIAGNOSIS — E876 Hypokalemia: Secondary | ICD-10-CM | POA: Diagnosis present

## 2020-04-22 DIAGNOSIS — A4102 Sepsis due to Methicillin resistant Staphylococcus aureus: Secondary | ICD-10-CM | POA: Diagnosis present

## 2020-04-22 DIAGNOSIS — Z89412 Acquired absence of left great toe: Secondary | ICD-10-CM | POA: Diagnosis not present

## 2020-04-22 DIAGNOSIS — Z85828 Personal history of other malignant neoplasm of skin: Secondary | ICD-10-CM

## 2020-04-22 DIAGNOSIS — L97422 Non-pressure chronic ulcer of left heel and midfoot with fat layer exposed: Secondary | ICD-10-CM | POA: Diagnosis not present

## 2020-04-22 DIAGNOSIS — E785 Hyperlipidemia, unspecified: Secondary | ICD-10-CM | POA: Diagnosis present

## 2020-04-22 DIAGNOSIS — M25562 Pain in left knee: Secondary | ICD-10-CM | POA: Diagnosis not present

## 2020-04-22 DIAGNOSIS — L03116 Cellulitis of left lower limb: Secondary | ICD-10-CM | POA: Diagnosis present

## 2020-04-22 DIAGNOSIS — Z888 Allergy status to other drugs, medicaments and biological substances status: Secondary | ICD-10-CM

## 2020-04-22 DIAGNOSIS — G250 Essential tremor: Secondary | ICD-10-CM | POA: Diagnosis present

## 2020-04-22 DIAGNOSIS — Z951 Presence of aortocoronary bypass graft: Secondary | ICD-10-CM

## 2020-04-22 DIAGNOSIS — G609 Hereditary and idiopathic neuropathy, unspecified: Secondary | ICD-10-CM | POA: Diagnosis present

## 2020-04-22 DIAGNOSIS — I509 Heart failure, unspecified: Secondary | ICD-10-CM | POA: Diagnosis not present

## 2020-04-22 DIAGNOSIS — Z79899 Other long term (current) drug therapy: Secondary | ICD-10-CM | POA: Diagnosis not present

## 2020-04-22 DIAGNOSIS — R600 Localized edema: Secondary | ICD-10-CM | POA: Diagnosis not present

## 2020-04-22 DIAGNOSIS — I5042 Chronic combined systolic (congestive) and diastolic (congestive) heart failure: Secondary | ICD-10-CM | POA: Diagnosis present

## 2020-04-22 DIAGNOSIS — K219 Gastro-esophageal reflux disease without esophagitis: Secondary | ICD-10-CM | POA: Diagnosis present

## 2020-04-22 DIAGNOSIS — E222 Syndrome of inappropriate secretion of antidiuretic hormone: Secondary | ICD-10-CM | POA: Diagnosis present

## 2020-04-22 DIAGNOSIS — Z8546 Personal history of malignant neoplasm of prostate: Secondary | ICD-10-CM | POA: Diagnosis not present

## 2020-04-22 DIAGNOSIS — I10 Essential (primary) hypertension: Secondary | ICD-10-CM | POA: Diagnosis not present

## 2020-04-22 DIAGNOSIS — I5022 Chronic systolic (congestive) heart failure: Secondary | ICD-10-CM | POA: Diagnosis present

## 2020-04-22 DIAGNOSIS — D638 Anemia in other chronic diseases classified elsewhere: Secondary | ICD-10-CM | POA: Diagnosis present

## 2020-04-22 DIAGNOSIS — Z7982 Long term (current) use of aspirin: Secondary | ICD-10-CM

## 2020-04-22 DIAGNOSIS — E7800 Pure hypercholesterolemia, unspecified: Secondary | ICD-10-CM | POA: Diagnosis present

## 2020-04-22 DIAGNOSIS — M79605 Pain in left leg: Secondary | ICD-10-CM | POA: Diagnosis not present

## 2020-04-22 DIAGNOSIS — R5381 Other malaise: Secondary | ICD-10-CM

## 2020-04-22 DIAGNOSIS — M7989 Other specified soft tissue disorders: Secondary | ICD-10-CM | POA: Diagnosis not present

## 2020-04-22 DIAGNOSIS — Z8249 Family history of ischemic heart disease and other diseases of the circulatory system: Secondary | ICD-10-CM

## 2020-04-22 LAB — URINALYSIS, COMPLETE (UACMP) WITH MICROSCOPIC
Bacteria, UA: NONE SEEN
Bilirubin Urine: NEGATIVE
Glucose, UA: NEGATIVE mg/dL
Ketones, ur: NEGATIVE mg/dL
Leukocytes,Ua: NEGATIVE
Nitrite: NEGATIVE
Protein, ur: NEGATIVE mg/dL
Specific Gravity, Urine: 1.011 (ref 1.005–1.030)
pH: 6 (ref 5.0–8.0)

## 2020-04-22 LAB — IRON AND TIBC
Iron: 40 ug/dL — ABNORMAL LOW (ref 45–182)
Saturation Ratios: 14 % — ABNORMAL LOW (ref 17.9–39.5)
TIBC: 291 ug/dL (ref 250–450)
UIBC: 251 ug/dL

## 2020-04-22 LAB — MAGNESIUM: Magnesium: <0.1 mg/dL — CL (ref 1.7–2.4)

## 2020-04-22 LAB — COMPREHENSIVE METABOLIC PANEL
ALT: 15 U/L (ref 0–44)
AST: 18 U/L (ref 15–41)
Albumin: 4 g/dL (ref 3.5–5.0)
Alkaline Phosphatase: 68 U/L (ref 38–126)
Anion gap: 12 (ref 5–15)
BUN: 14 mg/dL (ref 8–23)
CO2: 27 mmol/L (ref 22–32)
Calcium: 9.4 mg/dL (ref 8.9–10.3)
Chloride: 90 mmol/L — ABNORMAL LOW (ref 98–111)
Creatinine, Ser: 0.65 mg/dL (ref 0.61–1.24)
GFR, Estimated: >60 mL/min (ref >60)
Glucose, Bld: 101 mg/dL — ABNORMAL HIGH (ref 70–99)
Potassium: 3 mmol/L — ABNORMAL LOW (ref 3.5–5.1)
Sodium: 129 mmol/L — ABNORMAL LOW (ref 135–145)
Total Bilirubin: 1 mg/dL (ref 0.3–1.2)
Total Protein: 7.5 g/dL (ref 6.5–8.1)

## 2020-04-22 LAB — CBC WITH DIFFERENTIAL/PLATELET
Abs Immature Granulocytes: 0.04 10*3/uL (ref 0.00–0.07)
Basophils Absolute: 0 10*3/uL (ref 0.0–0.1)
Basophils Relative: 0 %
Eosinophils Absolute: 0.1 10*3/uL (ref 0.0–0.5)
Eosinophils Relative: 1 %
HCT: 30.9 % — ABNORMAL LOW (ref 39.0–52.0)
Hemoglobin: 11 g/dL — ABNORMAL LOW (ref 13.0–17.0)
Immature Granulocytes: 0 %
Lymphocytes Relative: 8 %
Lymphs Abs: 0.8 10*3/uL (ref 0.7–4.0)
MCH: 37.3 pg — ABNORMAL HIGH (ref 26.0–34.0)
MCHC: 35.6 g/dL (ref 30.0–36.0)
MCV: 104.7 fL — ABNORMAL HIGH (ref 80.0–100.0)
Monocytes Absolute: 0.7 10*3/uL (ref 0.1–1.0)
Monocytes Relative: 7 %
Neutro Abs: 8.1 10*3/uL — ABNORMAL HIGH (ref 1.7–7.7)
Neutrophils Relative %: 84 %
Platelets: 205 10*3/uL (ref 150–400)
RBC: 2.95 MIL/uL — ABNORMAL LOW (ref 4.22–5.81)
RDW: 13.9 % (ref 11.5–15.5)
WBC: 9.7 10*3/uL (ref 4.0–10.5)
nRBC: 0 % (ref 0.0–0.2)

## 2020-04-22 LAB — BRAIN NATRIURETIC PEPTIDE: B Natriuretic Peptide: 233.9 pg/mL — ABNORMAL HIGH (ref 0.0–100.0)

## 2020-04-22 LAB — LACTIC ACID, PLASMA
Lactic Acid, Venous: 1 mmol/L (ref 0.5–1.9)
Lactic Acid, Venous: 1 mmol/L (ref 0.5–1.9)

## 2020-04-22 LAB — OSMOLALITY: Osmolality: 270 mOsm/kg — ABNORMAL LOW (ref 275–295)

## 2020-04-22 LAB — OSMOLALITY, URINE: Osmolality, Ur: 423 mOsm/kg (ref 300–900)

## 2020-04-22 LAB — RESPIRATORY PANEL BY RT PCR (FLU A&B, COVID)
Influenza A by PCR: NEGATIVE
Influenza B by PCR: NEGATIVE
SARS Coronavirus 2 by RT PCR: NEGATIVE

## 2020-04-22 LAB — FOLATE: Folate: 45 ng/mL (ref >5.9)

## 2020-04-22 MED ORDER — ENOXAPARIN SODIUM 40 MG/0.4ML ~~LOC~~ SOLN
40.0000 mg | SUBCUTANEOUS | Status: DC
  Administered 2020-04-22 – 2020-04-24 (×3): 40 mg via SUBCUTANEOUS
  Filled 2020-04-22 (×3): qty 0.4

## 2020-04-22 MED ORDER — VANCOMYCIN HCL IN DEXTROSE 1-5 GM/200ML-% IV SOLN: 1000.0000 mg | Freq: Two times a day (BID) | INTRAVENOUS | Status: DC

## 2020-04-22 MED ORDER — BISACODYL 5 MG PO TBEC: 5.0000 mg | DELAYED_RELEASE_TABLET | Freq: Every day | ORAL | Status: DC | PRN

## 2020-04-22 MED ORDER — IBUPROFEN 400 MG PO TABS
400.0000 mg | ORAL_TABLET | Freq: Once | ORAL | Status: AC | PRN
  Administered 2020-04-22: 400 mg via ORAL
  Filled 2020-04-22: qty 1

## 2020-04-22 MED ORDER — MAGNESIUM SULFATE 4 GM/100ML IV SOLN
4.0000 g | Freq: Once | INTRAVENOUS | Status: AC
  Administered 2020-04-23: 4 g via INTRAVENOUS
  Filled 2020-04-22: qty 100

## 2020-04-22 MED ORDER — ASPIRIN EC 81 MG PO TBEC
81.0000 mg | DELAYED_RELEASE_TABLET | Freq: Every day | ORAL | Status: DC
  Administered 2020-04-22 – 2020-04-25 (×4): 81 mg via ORAL
  Filled 2020-04-22 (×4): qty 1

## 2020-04-22 MED ORDER — AMLODIPINE BESYLATE 5 MG PO TABS
2.5000 mg | ORAL_TABLET | Freq: Every day | ORAL | Status: DC
  Administered 2020-04-22 – 2020-04-25 (×4): 2.5 mg via ORAL
  Filled 2020-04-22 (×4): qty 1

## 2020-04-22 MED ORDER — PRIMIDONE 50 MG PO TABS
50.0000 mg | ORAL_TABLET | Freq: Three times a day (TID) | ORAL | Status: DC
  Administered 2020-04-23 – 2020-04-25 (×7): 50 mg via ORAL
  Filled 2020-04-22 (×11): qty 1

## 2020-04-22 MED ORDER — VANCOMYCIN HCL 1750 MG/350ML IV SOLN
1750.0000 mg | Freq: Once | INTRAVENOUS | Status: AC
  Administered 2020-04-22: 1750 mg via INTRAVENOUS
  Filled 2020-04-22: qty 350

## 2020-04-22 MED ORDER — ONDANSETRON HCL 4 MG/2ML IJ SOLN: 4.0000 mg | Freq: Four times a day (QID) | INTRAMUSCULAR | Status: DC | PRN

## 2020-04-22 MED ORDER — ONDANSETRON HCL 4 MG/2ML IJ SOLN
4.0000 mg | Freq: Once | INTRAMUSCULAR | Status: AC
  Administered 2020-04-22: 4 mg via INTRAVENOUS
  Filled 2020-04-22: qty 2

## 2020-04-22 MED ORDER — SODIUM CHLORIDE 0.9 % IV BOLUS
1000.0000 mL | Freq: Once | INTRAVENOUS | Status: AC
  Administered 2020-04-22: 1000 mL via INTRAVENOUS

## 2020-04-22 MED ORDER — LOSARTAN POTASSIUM 50 MG PO TABS
50.0000 mg | ORAL_TABLET | Freq: Every day | ORAL | Status: DC
  Administered 2020-04-23 – 2020-04-25 (×3): 50 mg via ORAL
  Filled 2020-04-22 (×3): qty 1

## 2020-04-22 MED ORDER — PRAVASTATIN SODIUM 40 MG PO TABS
80.0000 mg | ORAL_TABLET | Freq: Every day | ORAL | Status: DC
  Administered 2020-04-23 – 2020-04-24 (×2): 80 mg via ORAL
  Filled 2020-04-22: qty 2
  Filled 2020-04-22: qty 4
  Filled 2020-04-22 (×2): qty 2

## 2020-04-22 MED ORDER — VANCOMYCIN HCL IN DEXTROSE 1-5 GM/200ML-% IV SOLN: 1000.0000 mg | Freq: Once | INTRAVENOUS | Status: DC

## 2020-04-22 MED ORDER — POTASSIUM CHLORIDE 10 MEQ/100ML IV SOLN
10.0000 meq | INTRAVENOUS | Status: AC
  Administered 2020-04-22 – 2020-04-23 (×4): 10 meq via INTRAVENOUS
  Filled 2020-04-22 (×4): qty 100

## 2020-04-22 MED ORDER — TRAMADOL HCL 50 MG PO TABS
50.0000 mg | ORAL_TABLET | Freq: Three times a day (TID) | ORAL | Status: DC | PRN
  Administered 2020-04-23 – 2020-04-25 (×5): 50 mg via ORAL
  Filled 2020-04-22 (×5): qty 1

## 2020-04-22 MED ORDER — MORPHINE SULFATE (PF) 4 MG/ML IV SOLN
4.0000 mg | Freq: Once | INTRAVENOUS | Status: AC
  Administered 2020-04-22: 4 mg via INTRAVENOUS
  Filled 2020-04-22: qty 1

## 2020-04-22 MED ORDER — POTASSIUM CHLORIDE CRYS ER 20 MEQ PO TBCR
40.0000 meq | EXTENDED_RELEASE_TABLET | Freq: Once | ORAL | Status: AC
  Administered 2020-04-22: 40 meq via ORAL
  Filled 2020-04-22: qty 2

## 2020-04-22 MED ORDER — VANCOMYCIN HCL 1250 MG/250ML IV SOLN
1250.0000 mg | Freq: Two times a day (BID) | INTRAVENOUS | Status: DC
  Administered 2020-04-23: 1250 mg via INTRAVENOUS
  Filled 2020-04-22 (×2): qty 250

## 2020-04-22 MED ORDER — ACETAMINOPHEN 500 MG PO TABS
1000.0000 mg | ORAL_TABLET | Freq: Once | ORAL | Status: AC
  Administered 2020-04-22: 1000 mg via ORAL
  Filled 2020-04-22: qty 2

## 2020-04-22 MED ORDER — SODIUM CHLORIDE 0.9 % IV SOLN
2.0000 g | Freq: Once | INTRAVENOUS | Status: AC
  Administered 2020-04-22: 2 g via INTRAVENOUS
  Filled 2020-04-22: qty 20

## 2020-04-22 MED ORDER — PANTOPRAZOLE SODIUM 40 MG PO TBEC
40.0000 mg | DELAYED_RELEASE_TABLET | Freq: Every day | ORAL | Status: DC
  Administered 2020-04-22 – 2020-04-25 (×4): 40 mg via ORAL
  Filled 2020-04-22 (×4): qty 1

## 2020-04-22 MED ORDER — ONDANSETRON HCL 4 MG PO TABS: 4.0000 mg | ORAL_TABLET | Freq: Four times a day (QID) | ORAL | Status: DC | PRN

## 2020-04-22 MED ORDER — SODIUM CHLORIDE 0.9 % IV SOLN: 2.0000 g | Freq: Once | INTRAVENOUS | Status: DC

## 2020-04-22 MED ORDER — SODIUM CHLORIDE 0.9 % IV SOLN
2.0000 g | Freq: Three times a day (TID) | INTRAVENOUS | Status: DC
  Administered 2020-04-23 – 2020-04-25 (×5): 2 g via INTRAVENOUS
  Filled 2020-04-22 (×8): qty 2

## 2020-04-22 MED ORDER — GABAPENTIN 600 MG PO TABS
1200.0000 mg | ORAL_TABLET | Freq: Two times a day (BID) | ORAL | Status: DC
  Administered 2020-04-22 – 2020-04-25 (×6): 1200 mg via ORAL
  Filled 2020-04-22 (×6): qty 2

## 2020-04-22 NOTE — Progress Notes (Addendum)
CODE SEPSIS - PHARMACY COMMUNICATION  **Broad Spectrum Antibiotics should be administered within 1 hour of Sepsis diagnosis**  Time Code Sepsis Called/Page Received: 1842  Antibiotics Ordered:  Vancomycin 1750mg  IV x1 Ceftriaxone 2g IV x1  Time of 1st antibiotic administration: [place], PharmD Pharmacy Resident  04/22/2020 6:45 PM

## 2020-04-22 NOTE — Progress Notes (Signed)
Patient with a critical magnesium level. MD notified and new orders were acknowledged. Patient is resting in bed with no complaints and is now on a cardiac monitor.

## 2020-04-22 NOTE — ED Notes (Signed)
Attempted to obtain labs - [place] at bedside currently

## 2020-04-22 NOTE — ED Triage Notes (Signed)
Pt comes into the ED via POV c/o left knee pain.  Pt is currently being seen at the wound clinic for an open wound on his left heel that has been cultured positive for MRSA.  Pt was seen in the clinic today where they noticed redness, warmth, and tenderness to the left knee.  PT's MD sent him to r/o cellulitis and DVT.  PT has still be able to be ambulatory with gait assistive devices.  Pt in NAD at this time with even and unlabored respirations. Pt denies any blood thinner use.

## 2020-04-22 NOTE — Progress Notes (Signed)
Pharmacy Antibiotic Note  [name] is a 84 y.o. male admitted on 04/22/2020 with sepsis 2/2 cellulitis. Patient had redness of anterior left knee 2-3 days ago; worsening redness and pain over the past 24 hours. Patient is being managed for an ulcer of his L heel at wound care and was sent to ED for evaluation from wound care clinic. [place] left leg no evidence DVT. Patient received vancomycin loading dose and one time dose of ceftriaxone in ED. Pharmacy has been consulted for vancomycin and cefepime dosing. Blood cultures pending.   Plan: Start cefepime 2g IV q8h Start Vancomycin 1250mg  IV q12h Obtain vanc level prior to 4th or 5th dose Monitor renal function and adjust dose as clinically indicated  Height: 6' (182.9 cm) Weight: 93 kg (205 lb) IBW/kg (Calculated) : 77.6  Temp (24hrs), Avg:100.1 F (37.8 C), Min:98.5 F (36.9 C), Max:102.7 F (39.3 C)  Recent Labs  Lab 04/22/20 1526 04/22/20 1642  WBC 9.7  --   CREATININE 0.65  --   LATICACIDVEN 1.0 1.0    Estimated Creatinine Clearance: 78.1 mL/min (by C-G formula based on SCr of 0.65 mg/dL).    Allergies  Allergen Reactions  . Codeine Nausea And Vomiting  . Indocin [Indomethacin] Other (See Comments)  . Latex Other (See Comments)    Blisters  . Mupirocin     Blisters  . Plavix [Clopidogrel Bisulfate]     GI intolerance, n/v  . Polysporin [Bacitracin-Polymyxin B]     Blisters  . Shellfish Allergy   . Tape   . Neosporin [Neomycin-Bacitracin Zn-Polymyx] Rash  . Other Nausea And Vomiting    mussels    Antimicrobials this admission: Ceftriaxone 11/10 x1 Vancomycin 11/10 >>  Cefepime 11/10 >>   Microbiology results: 11/10 BCx: pending  Thank you for allowing pharmacy to be a part of this patient's care.  Sherilyn Banker, PharmD Pharmacy Resident  04/22/2020 7:01 PM

## 2020-04-22 NOTE — Consult Note (Signed)
PHARMACY -  BRIEF ANTIBIOTIC NOTE   Pharmacy has received consult(s) for Vancomycin from an ED provider.  The patient's profile has been reviewed for ht/wt/allergies/indication/available labs.    One time order(s) placed for Vancomycin IV 1750mg  x 1  Further antibiotics/pharmacy consults should be ordered by admitting physician if indicated.                       Thank you,  Lu Duffel, PharmD, BCPS Clinical Pharmacist 04/22/2020 4:11 PM

## 2020-04-22 NOTE — H&P (Addendum)
[facility] History and Physical   Patient: [name] RCV:[id number]   PCP: [name], DO DOB: 1935-06-22   DOA: 04/22/2020   DOS: 04/22/2020   DOS: the patient was seen and examined on 04/22/2020    Patient coming from: The patient is coming from Home  Chief Complaint: Left knee pain  HPI: Zacharius Funari is a 84 y.o. male with Past medical history of HTN, CAD, HLD, CHF, neuropathy below knee and bilateral hands, moderate TR, chronic hyponatremia, osteoarthritis, left great toe amputation, bilateral foot drops uses a special shoes, chronic left heel ulcer follows at wound care clinic, prostate cancer status post surgical resection in 2004.  Patient presented at [facility] ED, sent from wound care clinic due to left knee swelling and worsening of pain.  Patient has history of MRSA positive cultures in the past, on exam it seems patient is having left knee cellulitis but does not seem to have septic arthritis.  Patient stated that left knee swelling and pain is getting worse for the past few days.  Patient was febrile and having tachycardia in the ED.  Patient denies any shortness of breath, no chest pain, no palpitations.  Patient denies any abdominal pain, no nausea vomiting or diarrhea.   ED Course: Hyponatremia sodium 129, hypokalemia potassium 3.0, anemia of chronic disease hemoglobin 11.0, elevated MCV, patient was slightly tachycardic and hypertensive Left knee x-ray: 1. Prepatellar soft tissue swelling. Otherwise unremarkable left Knee. Venous duplex negative for DVT Blood culture pending Patient was given vancomycin and ceftriaxone in the ED, admitted for further management as below.   Review of Systems: as mentioned in the history of present illness.  All other systems reviewed and are negative.  Past Medical History:  Diagnosis Date  . Arthritis   . Benign essential tremor   . Fracture, femur ([place]) 2012  . GERD (gastroesophageal reflux disease)   . H/O  echocardiogram    05/2013  . Hx of colonoscopy    04/22/2013  . Hypercholesterolemia   . Hypertension   . Myocardial infarction ([place])    1987  . Polyneuropathy   . Prostate cancer ([place]) 2004  . Superficial hematoma    Past Surgical History:  Procedure Laterality Date  . BASAL CELL CARCINOMA EXCISION    . COLONOSCOPY  05/01/2009   Positive for colonic polyps  . COLONOSCOPY WITH PROPOFOL N/A 02/24/2016   Procedure: COLONOSCOPY WITH PROPOFOL;  Surgeon: Christene Lye, MD;  Location: [facility] ENDOSCOPY;  Service: Endoscopy;  Laterality: N/A;  . CORONARY ARTERY BYPASS GRAFT  1987   X2  . HERNIA REPAIR  1990  . PROSTATE SURGERY  2004   Social History:  reports that he has never smoked. He has never used smokeless tobacco. He reports current alcohol use of about 7.0 standard drinks of alcohol per week. He reports that he does not use drugs.  Allergies  Allergen Reactions  . Codeine Nausea And Vomiting  . Indocin [Indomethacin] Other (See Comments)  . Latex Other (See Comments)    Blisters  . Mupirocin     Blisters  . Plavix [Clopidogrel Bisulfate]     GI intolerance, n/v  . Polysporin [Bacitracin-Polymyxin B]     Blisters  . Shellfish Allergy   . Tape   . Neosporin [Neomycin-Bacitracin Zn-Polymyx] Rash  . Other Nausea And Vomiting    mussels     Family history reviewed and not pertinent Family History  Problem Relation Age of Onset  . Heart disease Mother   .  Congestive Heart Failure Father      Prior to Admission medications   Medication Sig Start Date End Date Taking? Authorizing Provider  Alpha Lipoic Acid 200 MG CAPS Take 2 capsules by mouth daily.    [provider]  amLODipine (NORVASC) 2.5 MG tablet TAKE 1 TABLET BY MOUTH  DAILY 02/23/20   Karamalegos, Devonne Doughty, DO  Apoaequorin (PREVAGEN PO) Take by mouth.    [provider]  aspirin 81 MG tablet Take 81 mg by mouth daily.     [provider]  azelastine (ASTELIN) 0.1 % nasal  spray Place 1 spray into both nostrils 2 (two) times daily. Use in each nostril as directed 12/23/19   [name], DO  B Complex Vitamins (VITAMIN-B COMPLEX PO) Take 1 tablet by mouth daily. Reported on 10/06/2015    [provider]  Biotin 1000 MCG tablet Take 1,000 mcg by mouth daily.    [provider]  Calcium Carb-Cholecalciferol 500-100 MG-UNIT CHEW Chew 500 mg by mouth daily.    [provider]  cyanocobalamin 1000 MCG tablet Take 1,000 mcg by mouth daily.    [provider]  folic acid (FOLVITE) 1 MG tablet Take 1 mg by mouth daily.    [provider]  furosemide (LASIX) 20 MG tablet TAKE 1 TABLET BY MOUTH  DAILY 02/23/20   Parks Ranger, Devonne Doughty, DO  gabapentin (NEURONTIN) 600 MG tablet TAKE 2 TABLETS BY MOUTH  TWICE DAILY 02/23/20   Karamalegos, Devonne Doughty, DO  Glucosamine Sulfate 1000 MG CAPS Take 1 capsule by mouth daily.    [provider]  hydrochlorothiazide (HYDRODIURIL) 25 MG tablet Take 1 tablet (25 mg total) by mouth 2 (two) times daily. 09/20/19   Karamalegos, Devonne Doughty, DO  Lecithin 400 MG CAPS Take 400 mg by mouth daily.    [provider]  losartan (COZAAR) 50 MG tablet TAKE 1 TABLET BY MOUTH  DAILY 02/24/20   Parks Ranger, Devonne Doughty, DO  Melatonin 3 MG CAPS Take 3 mg by mouth at bedtime.    [provider]  meloxicam (MOBIC) 15 MG tablet Take 1 tablet (15 mg total) by mouth daily as needed for pain. For up to 1-2 weeks, then reduce to less frequent for longer if need 03/06/18   Parks Ranger, Alexander J, DO  Omega 3 1000 MG CAPS Take 1,000 mg by mouth daily.    [provider]  omeprazole (PRILOSEC) 20 MG capsule TAKE 1 CAPSULE BY MOUTH  TWICE DAILY BEFORE MEALS 02/23/20   Karamalegos, Devonne Doughty, DO  ondansetron (ZOFRAN-ODT) 8 MG disintegrating tablet Take 1 tablet (8 mg total) by mouth daily as needed for nausea or vomiting. 09/05/19   Parks Ranger, Devonne Doughty, DO  potassium chloride  SA (KLOR-CON) 20 MEQ tablet Take 1 tablet (20 mEq total) by mouth 2 (two) times daily. 12/19/19   Karamalegos, Devonne Doughty, DO  pravastatin (PRAVACHOL) 80 MG tablet TAKE 1 TABLET BY MOUTH  DAILY 02/23/20   Parks Ranger, Devonne Doughty, DO  primidone (MYSOLINE) 50 MG tablet TAKE 1 TABLET BY MOUTH 3  TIMES DAILY 02/24/20   Parks Ranger, Devonne Doughty, DO  Selenium 200 MCG CAPS Take 200 mcg by mouth daily.    [provider]  tretinoin (RETIN-A) 0.05 % cream Apply 1 application topically at bedtime.    [provider]    Physical Exam: Vitals:   04/22/20 1830 04/22/20 1900 04/22/20 1915 04/22/20 1928  BP: (!) 117/53 134/64    Pulse: (!) 113 Marland Kitchen)  108 (!) 104   Resp: 18 (!) 22 16   Temp:    (!) 102.2 F (39 C)  TempSrc:    Rectal  SpO2: 94% 94% 96%   Weight:      Height:        General: alert and oriented to time, place, and person. Appear in mild distress, affect appropriate Eyes: PERRLA, Conjunctiva normal ENT: Oral Mucosa Clear, moist  Neck: no JVD, no Abnormal Mass Or lumps Cardiovascular: S1 and S2 Present, no Murmur, peripheral pulses symmetrical Respiratory: good respiratory effort, Bilateral Air entry equal and Decreased, no signs of accessory muscle use, Clear to Auscultation, no Crackles, no wheezes Abdomen: Bowel Sound present, Soft and no tenderness, no hernia Skin: no rashes  Extremities: 4+ Pedal edema LLEx, muild calf tenderness, left knee swelling, erythema and tenderness Neurologic: without any new focal findings Gait not checked due to patient safety concerns  Data Reviewed: I have personally reviewed and interpreted labs, imaging as discussed below.  CBC: Recent Labs  Lab 04/22/20 1526  WBC 9.7  NEUTROABS 8.1*  HGB 11.0*  HCT 30.9*  MCV 104.7*  PLT 161   Basic Metabolic Panel: Recent Labs  Lab 04/22/20 1526  NA 129*  K 3.0*  CL 90*  CO2 27  GLUCOSE 101*  BUN 14  CREATININE 0.65  CALCIUM 9.4   GFR: Estimated Creatinine Clearance: 78.1  mL/min (by C-G formula based on SCr of 0.65 mg/dL). Liver Function Tests: Recent Labs  Lab 04/22/20 1526  AST 18  ALT 15  ALKPHOS 68  BILITOT 1.0  PROT 7.5  ALBUMIN 4.0   No results for input(s): LIPASE, AMYLASE in the last 168 hours. No results for input(s): AMMONIA in the last 168 hours. Coagulation Profile: No results for input(s): INR, PROTIME in the last 168 hours. Cardiac Enzymes: No results for input(s): CKTOTAL, CKMB, CKMBINDEX, TROPONINI in the last 168 hours. BNP (last 3 results) No results for input(s): PROBNP in the last 8760 hours. HbA1C: No results for input(s): HGBA1C in the last 72 hours. CBG: No results for input(s): GLUCAP in the last 168 hours. Lipid Profile: No results for input(s): CHOL, HDL, LDLCALC, TRIG, CHOLHDL, LDLDIRECT in the last 72 hours. Thyroid Function Tests: No results for input(s): TSH, T4TOTAL, FREET4, T3FREE, THYROIDAB in the last 72 hours. Anemia Panel: No results for input(s): VITAMINB12, FOLATE, FERRITIN, TIBC, IRON, RETICCTPCT in the last 72 hours. Urine analysis:    Component Value Date/Time   COLORURINE YELLOW (A) 04/22/2020 1817   APPEARANCEUR CLEAR (A) 04/22/2020 1817   LABSPEC 1.011 04/22/2020 1817   PHURINE 6.0 04/22/2020 1817   GLUCOSEU NEGATIVE 04/22/2020 1817   HGBUR SMALL (A) 04/22/2020 1817   BILIRUBINUR NEGATIVE 04/22/2020 1817   KETONESUR NEGATIVE 04/22/2020 1817   PROTEINUR NEGATIVE 04/22/2020 1817   NITRITE NEGATIVE 04/22/2020 1817   LEUKOCYTESUR NEGATIVE 04/22/2020 1817    Radiological Exams on Admission: US Venous Img Lower Unilateral Left  Result Date: 04/22/2020 CLINICAL DATA:  Left lower extremity pain and edema. EXAM: LEFT LOWER EXTREMITY VENOUS DOPPLER ULTRASOUND TECHNIQUE: Gray-scale sonography with graded compression, as well as color Doppler and duplex ultrasound were performed to evaluate the lower extremity deep venous systems from the level of the common femoral vein and including the common  femoral, femoral, profunda femoral, popliteal and calf veins including the posterior tibial, peroneal and gastrocnemius veins when visible. The superficial great saphenous vein was also interrogated. Spectral Doppler was utilized to evaluate flow at rest and with distal augmentation  maneuvers in the common femoral, femoral and popliteal veins. COMPARISON:  None. FINDINGS: Contralateral Common Femoral Vein: Respiratory phasicity is normal and symmetric with the symptomatic side. No evidence of thrombus. Normal compressibility. Common Femoral Vein: No evidence of thrombus. Normal compressibility, respiratory phasicity and response to augmentation. Saphenofemoral Junction: No evidence of thrombus. Normal compressibility and flow on color Doppler imaging. Profunda Femoral Vein: No evidence of thrombus. Normal compressibility and flow on color Doppler imaging. Femoral Vein: No evidence of thrombus. Normal compressibility, respiratory phasicity and response to augmentation. Popliteal Vein: No evidence of thrombus. Normal compressibility, respiratory phasicity and response to augmentation. Calf Veins: No evidence of thrombus. Normal compressibility and flow on color Doppler imaging. Superficial Great Saphenous Vein: No evidence of thrombus. Normal compressibility. Venous Reflux:  None. Other Findings: No evidence of superficial thrombophlebitis or abnormal fluid collection. There are some mildly prominent but nonenlarged left inguinal lymph nodes which may be reactive. IMPRESSION: No evidence of left lower extremity deep venous thrombosis. Electronically Signed   By: Aletta Edouard M.D.   On: 04/22/2020 17:01   DG Knee Complete 4 Views Left  Result Date: 04/22/2020 CLINICAL DATA:  Left knee pain and swelling EXAM: LEFT KNEE - COMPLETE 4+ VIEW COMPARISON:  None. FINDINGS: Frontal, bilateral oblique, lateral views of the left knee are obtained. No fracture, subluxation, or dislocation. Joint spaces are well preserved.  No joint effusion. Prepatellar soft tissue edema is noted. IMPRESSION: 1. Prepatellar soft tissue swelling. Otherwise unremarkable left knee. Electronically Signed   By: Randa Ngo M.D.   On: 04/22/2020 17:29    I reviewed all nursing notes, pharmacy notes, vitals, pertinent old records.  Assessment/Plan  Principal Problem:   Cellulitis of knee, left Active Problems:   Idiopathic peripheral neuropathy   Essential hypertension   CAD (coronary artery disease)   Chronic systolic CHF (congestive heart failure), NYHA class 2 (HCC)   Hyponatremia   Hypokalemia   # Cellulitis of left knee, history of prior MRSA positive wound culture Vancomycin and ceftriaxone was given in the ED Started broad-spectrum antibiotics vancomycin and cefepime Pharmacy consulted for dosing and Vanco trough monitoring Monitor renal functions Chronic left heel ulcer, continue wound care Follow blood cultures   # Hyponatremia unknown cause, possible CHF?? Check BNP, 2D echocardiogram, serum and urine osmolality, urine electrolytes Monitor sodium level daily Started fluid suction 1.5 L/day   # Hypokalemia most likely secondary to diuretics, patient was taking Lasix in hydrochlorothiazide Held Lasix and hydrochlorothiazide for now Potassium repleted Check magnesium level   # Anemia of chronic disease with macrocytosis Follow anemia work-up, folate and B12 level Monitor H&H   #HTN, HLD, CAD, CHF Continue aspirin, amlodipine, losartan Monitor BP and titrate medication accordingly Follow 2D echocardiogram and BNP    #Peripheral neuropathy below knee and bilateral hands Resumed gabapentin  #Bilateral foot drops, patient wears special shoes. Continue fall precautions Follow PT and OT eval before discharge      Nutrition: Cardiac diet DVT Prophylaxis: Subcutaneous Lovenox  Advance goals of care discussion: Full code   Consults:   Family Communication: family was not present at  bedside, at the time of interview.  Opportunity was given to ask question and all questions were answered satisfactorily.  Disposition: Admitted as an inpatient, med-surge unit. Likely to be discharged home, in 2-3 days .  I have discussed plan of care as described above with RN and patient/family.  Severity of Illness: The appropriate patient status for this patient is INPATIENT. Inpatient status is judged  to be reasonable and necessary in order to provide the required intensity of service to ensure the patient's safety. The patient's presenting symptoms, physical exam findings, and initial radiographic and laboratory data in the context of their chronic comorbidities is felt to place them at high risk for further clinical deterioration. Furthermore, it is not anticipated that the patient will be medically stable for discharge from the hospital within 2 midnights of admission. The following factors support the patient status of inpatient.   " The patient's presenting symptoms include left swelling. " The worrisome physical exam findings include cellulitis. " The initial radiographic and laboratory data are worrisome because of hyponatremia hypokalemia. " The chronic co-morbidities include chronic left heel ulcer.   * I certify that at the point of admission it is my clinical judgment that the patient will require inpatient hospital care spanning beyond 2 midnights from the point of admission due to high intensity of service, high risk for further deterioration and high frequency of surveillance required.*    Author: Val Riles, MD [facility] 04/22/2020 7:58 PM   To reach On-call, see care teams to locate the attending and reach out to them via [email or web address]. If 7PM-7AM, please contact night-coverage If you still have difficulty reaching the attending provider, please page the [facility] (Director on Call) for [facility] on amion for assistance.

## 2020-04-22 NOTE — Telephone Encounter (Signed)
Patient seen 10/31 for similar issue at urgent care, then went to wound care [facility] 11/2 and has apt 11/16 with wound care.  No available apt openings today this afternoon when we were notified, will try to double book him tomorrow otherwise limited availability, he can return to urgent care if severe acute worse problem or hospital ED.  Otherwise he can contact [facility] to re-evaluate him sooner or address issue.  [name], [facility] 04/22/2020, 7:28 AM

## 2020-04-22 NOTE — ED Provider Notes (Signed)
[facility] Emergency Department Provider Note  ____________________________________________   First MD Initiated Contact with Patient 04/22/20 1550     (approximate)  I have reviewed the triage vital signs and the nursing notes.   HISTORY  Chief Complaint Knee Pain    HPI [name] is a 84 y.o. male  Here with left knee pain. Pt states his sx started as redness of his anterior left knee 2-3 days ago. He had no significant pain, only redness and slight warmth. Over the past 24 hours, this redness has significantly worsened along with swelling and now severe, 10/10, aching, throbbing pain. Pain is worse with any kind of palpation, movement, walking. He has chronic neuropathy in his legs so is surprised at the degree of pain. He is currently being managed for an ulcer of his L heel at wound care. He went to wound care clinic and was sent here for admission/evaluation. 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 No fevers but has had some chills. No n/v. No other rash/redness.          Past Medical History:  Diagnosis Date  . Arthritis   . Benign essential tremor   . Fracture, femur ([place]) 2012  . GERD (gastroesophageal reflux disease)   . H/O echocardiogram    05/2013  . Hx of colonoscopy    04/22/2013  . Hypercholesterolemia   . Hypertension   . Myocardial infarction ([place])    1987  . Polyneuropathy   . Prostate cancer ([place]) 2004  . Superficial hematoma     Patient Active Problem List   Diagnosis Date Noted  . Osteoarthritis of multiple joints 12/15/2017  . Chronic hyponatremia 03/28/2017  . Abnormality of gait and mobility 01/11/2017  . Cloudy vision 01/11/2017  . Foot drop, bilateral 01/11/2017  . Amputee, great toe, left ([place]) 01/11/2017  . 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 Macrocytic anemia 09/22/2015  . Moderate tricuspid insufficiency 04/21/2015  . History of prostate cancer 04/16/2015  . Idiopathic  peripheral neuropathy 04/16/2015  . Essential hypertension 04/16/2015  . GERD (gastroesophageal reflux disease) 04/16/2015  . Arthritis 04/16/2015  . Pedal edema 04/16/2015  . Benign essential tremor 10/30/2014  . Hyperlipidemia, mixed 10/27/2014  . Chronic systolic CHF (congestive heart failure), NYHA class 2 ([place]) 04/23/2014  . Mitral insufficiency 11/05/2013  . CAD (coronary artery disease) 11/01/2013  . Basal cell carcinoma of skin of other parts of face 03/14/2013  . Ankle sprain 09/09/2011  . History of fibula fracture 09/09/2011    Past Surgical History:  Procedure Laterality Date  . BASAL CELL CARCINOMA EXCISION    . COLONOSCOPY  05/01/2009   Positive for colonic polyps  . COLONOSCOPY WITH PROPOFOL N/A 02/24/2016   Procedure: COLONOSCOPY WITH PROPOFOL;  Surgeon: Christene Lye, MD;  Location: [facility] ENDOSCOPY;  Service: Endoscopy;  Laterality: N/A;  . CORONARY ARTERY BYPASS GRAFT  1987   X2  . HERNIA REPAIR  1990  . PROSTATE SURGERY  2004    Prior to Admission medications   Medication Sig Start Date End Date Taking? Authorizing Provider  Alpha Lipoic Acid 200 MG CAPS Take 2 capsules by mouth daily.    [provider]  amLODipine (NORVASC) 2.5 MG tablet TAKE 1 TABLET BY MOUTH  DAILY 02/23/20   Karamalegos, Devonne Doughty, DO  Apoaequorin (PREVAGEN PO) Take by mouth.    [provider]  aspirin 81 MG tablet Take 81 mg by mouth daily.  [provider]  azelastine (ASTELIN) 0.1 % nasal spray Place 1 spray into both nostrils 2 (two) times daily. Use in each nostril as directed 12/23/19   [name], DO  B Complex Vitamins (VITAMIN-B COMPLEX PO) Take 1 tablet by mouth daily. Reported on 10/06/2015    [provider]  Biotin 1000 MCG tablet Take 1,000 mcg by mouth daily.    [provider]  Calcium Carb-Cholecalciferol 500-100 MG-UNIT CHEW Chew 500 mg by mouth daily.    [provider]  cyanocobalamin 1000 MCG  tablet Take 1,000 mcg by mouth daily.    [provider]  folic acid (FOLVITE) 1 MG tablet Take 1 mg by mouth daily.    [provider]  furosemide (LASIX) 20 MG tablet TAKE 1 TABLET BY MOUTH  DAILY 02/23/20   Parks Ranger, Devonne Doughty, DO  gabapentin (NEURONTIN) 600 MG tablet TAKE 2 TABLETS BY MOUTH  TWICE DAILY 02/23/20   Karamalegos, Devonne Doughty, DO  Glucosamine Sulfate 1000 MG CAPS Take 1 capsule by mouth daily.    [provider]  hydrochlorothiazide (HYDRODIURIL) 25 MG tablet Take 1 tablet (25 mg total) by mouth 2 (two) times daily. 09/20/19   Karamalegos, Devonne Doughty, DO  Lecithin 400 MG CAPS Take 400 mg by mouth daily.    [provider]  losartan (COZAAR) 50 MG tablet TAKE 1 TABLET BY MOUTH  DAILY 02/24/20   Parks Ranger, Devonne Doughty, DO  Melatonin 3 MG CAPS Take 3 mg by mouth at bedtime.    [provider]  meloxicam (MOBIC) 15 MG tablet Take 1 tablet (15 mg total) by mouth daily as needed for pain. For up to 1-2 weeks, then reduce to less frequent for longer if need 03/06/18   Parks Ranger, Alexander J, DO  Omega 3 1000 MG CAPS Take 1,000 mg by mouth daily.    [provider]  omeprazole (PRILOSEC) 20 MG capsule TAKE 1 CAPSULE BY MOUTH  TWICE DAILY BEFORE MEALS 02/23/20   Karamalegos, Devonne Doughty, DO  ondansetron (ZOFRAN-ODT) 8 MG disintegrating tablet Take 1 tablet (8 mg total) by mouth daily as needed for nausea or vomiting. 09/05/19   Parks Ranger, Devonne Doughty, DO  potassium chloride SA (KLOR-CON) 20 MEQ tablet Take 1 tablet (20 mEq total) by mouth 2 (two) times daily. 12/19/19   Karamalegos, Devonne Doughty, DO  pravastatin (PRAVACHOL) 80 MG tablet TAKE 1 TABLET BY MOUTH  DAILY 02/23/20   Parks Ranger, Devonne Doughty, DO  primidone (MYSOLINE) 50 MG tablet TAKE 1 TABLET BY MOUTH 3  TIMES DAILY 02/24/20   Parks Ranger, Devonne Doughty, DO  Selenium 200 MCG CAPS Take 200 mcg by mouth daily.    [provider]  tretinoin (RETIN-A) 0.05 % cream Apply 1  application topically at bedtime.    [provider]    Allergies Codeine, Indocin [indomethacin], Latex, Mupirocin, Plavix [clopidogrel bisulfate], Polysporin [bacitracin-polymyxin b], Shellfish allergy, Tape, Neosporin [neomycin-bacitracin zn-polymyx], and Other  Family History  Problem Relation Age of Onset  . Heart disease Mother   . Congestive Heart Failure Father     Social History Social History   Tobacco Use  . Smoking status: Never Smoker  . Smokeless tobacco: Never Used  Vaping Use  . Vaping Use: Never used  Substance Use Topics  . Alcohol use: Yes    Alcohol/week: 7.0 standard drinks    Types: 7 Glasses of wine per week    Comment: wine in the evening with dinner  . Drug use: No  Review of Systems  Review of Systems  Constitutional: Positive for fatigue. Negative for chills and fever.  HENT: Negative for sore throat.   Respiratory: Negative for shortness of breath.   Cardiovascular: Negative for chest pain.  Gastrointestinal: Negative for abdominal pain.  Genitourinary: Negative for flank pain.  Musculoskeletal: Positive for arthralgias and myalgias. Negative for neck pain.  Skin: Positive for rash. Negative for wound.  Allergic/Immunologic: Negative for immunocompromised state.  Neurological: Negative for weakness and numbness.  Hematological: Does not bruise/bleed easily.  All other systems reviewed and are negative.    ____________________________________________  PHYSICAL EXAM:      VITAL SIGNS: ED Triage Vitals [04/22/20 1520]  Enc Vitals Group     BP 137/78     Pulse Rate (!) 103     Resp 20     Temp 98.5 F (36.9 C)     Temp Source Oral     SpO2 95 %     Weight 205 lb (93 kg)     Height 6' (1.829 m)     Head Circumference      Peak Flow      Pain Score 7     Pain Loc      Pain Edu?      Excl. in [place]?      Physical Exam Vitals and nursing note reviewed.  Constitutional:      General: He is not in acute distress.     Appearance: He is well-developed.  HENT:     Head: Normocephalic and atraumatic.  Eyes:     Conjunctiva/sclera: Conjunctivae normal.  Cardiovascular:     Rate and Rhythm: Regular rhythm. Tachycardia present.     Heart sounds: Normal heart sounds. No murmur heard.  No friction rub.  Pulmonary:     Effort: Pulmonary effort is normal. No respiratory distress.     Breath sounds: Normal breath sounds. No wheezing or rales.  Abdominal:     General: There is no distension.     Palpations: Abdomen is soft.     Tenderness: There is no abdominal tenderness.  Musculoskeletal:     Cervical back: Neck supple.  Skin:    General: Skin is warm.     Capillary Refill: Capillary refill takes less than 2 seconds.  Neurological:     Mental Status: He is alert and oriented to person, place, and time.     Motor: No abnormal muscle tone.      LOWER EXTREMITY EXAM: LEFT  INSPECTION & PALPATION: Marked erythema, tenderness along anterior knee extending to proximal lower leg and distal thigh, with streaking along medial thigh extending beyond marker borders. Diffuse TTP. No overt fluctuance. No appreciable knee effusion.  SENSORY: sensation is intact to light touch in:  Superficial peroneal nerve distribution (over dorsum of foot) Deep peroneal nerve distribution (over first dorsal web space) Sural nerve distribution (over lateral aspect 5th metatarsal) Saphenous nerve distribution (over medial instep)  MOTOR:  + Motor EHL (great toe dorsiflexion) + FHL (great toe plantar flexion)  + TA (ankle dorsiflexion)  + GSC (ankle plantar flexion)  VASCULAR: 2+ dorsalis pedis and posterior tibialis pulses Capillary refill < 2 sec, toes warm and well-perfused  COMPARTMENTS: Soft, warm, well-perfused No pain with passive extension No parethesias  ____________________________________________   LABS (all labs ordered are listed, but only abnormal results are displayed)  Labs Reviewed  COMPREHENSIVE  METABOLIC PANEL - Abnormal; Notable for the following components:      Result Value  Sodium 129 (*)    Potassium 3.0 (*)    Chloride 90 (*)    Glucose, Bld 101 (*)    All other components within normal limits  CBC WITH DIFFERENTIAL/PLATELET - Abnormal; Notable for the following components:   RBC 2.95 (*)    Hemoglobin 11.0 (*)    HCT 30.9 (*)    MCV 104.7 (*)    MCH 37.3 (*)    Neutro Abs 8.1 (*)    All other components within normal limits  RESPIRATORY PANEL BY RT PCR (FLU A&B, COVID)  CULTURE, BLOOD (ROUTINE X 2)  CULTURE, BLOOD (ROUTINE X 2)  LACTIC ACID, PLASMA  LACTIC ACID, PLASMA  URINALYSIS, COMPLETE (UACMP) WITH MICROSCOPIC    ____________________________________________  ________________________________________  RADIOLOGY All imaging, including plain films, CT scans, and ultrasounds, independently reviewed by me, and interpretations confirmed via formal radiology reads.  ED MD interpretation:   US DVT: no DVT XR Knee left: soft tissue swelling, no bony abnormality or knee effusion  Official radiology report(s): US Venous Img Lower Unilateral Left  Result Date: 04/22/2020 CLINICAL DATA:  Left lower extremity pain and edema. EXAM: LEFT LOWER EXTREMITY VENOUS DOPPLER ULTRASOUND TECHNIQUE: Gray-scale sonography with graded compression, as well as color Doppler and duplex ultrasound were performed to evaluate the lower extremity deep venous systems from the level of the common femoral vein and including the common femoral, femoral, profunda femoral, popliteal and calf veins including the posterior tibial, peroneal and gastrocnemius veins when visible. The superficial great saphenous vein was also interrogated. Spectral Doppler was utilized to evaluate flow at rest and with distal augmentation maneuvers in the common femoral, femoral and popliteal veins. COMPARISON:  None. FINDINGS: Contralateral Common Femoral Vein: Respiratory phasicity is normal and symmetric with the  symptomatic side. No evidence of thrombus. Normal compressibility. Common Femoral Vein: No evidence of thrombus. Normal compressibility, respiratory phasicity and response to augmentation. Saphenofemoral Junction: No evidence of thrombus. Normal compressibility and flow on color Doppler imaging. Profunda Femoral Vein: No evidence of thrombus. Normal compressibility and flow on color Doppler imaging. Femoral Vein: No evidence of thrombus. Normal compressibility, respiratory phasicity and response to augmentation. Popliteal Vein: No evidence of thrombus. Normal compressibility, respiratory phasicity and response to augmentation. Calf Veins: No evidence of thrombus. Normal compressibility and flow on color Doppler imaging. Superficial Great Saphenous Vein: No evidence of thrombus. Normal compressibility. Venous Reflux:  None. Other Findings: No evidence of superficial thrombophlebitis or abnormal fluid collection. There are some mildly prominent but nonenlarged left inguinal lymph nodes which may be reactive. IMPRESSION: No evidence of left lower extremity deep venous thrombosis. Electronically Signed   By: Aletta Edouard M.D.   On: 04/22/2020 17:01   DG Knee Complete 4 Views Left  Result Date: 04/22/2020 CLINICAL DATA:  Left knee pain and swelling EXAM: LEFT KNEE - COMPLETE 4+ VIEW COMPARISON:  None. FINDINGS: Frontal, bilateral oblique, lateral views of the left knee are obtained. No fracture, subluxation, or dislocation. Joint spaces are well preserved. No joint effusion. Prepatellar soft tissue edema is noted. IMPRESSION: 1. Prepatellar soft tissue swelling. Otherwise unremarkable left knee. Electronically Signed   By: Randa Ngo M.D.   On: 04/22/2020 17:29    ____________________________________________  PROCEDURES   Procedure(s) performed (including Critical Care):  Procedures  ____________________________________________  INITIAL IMPRESSION / MDM / [place] / ED COURSE  As  part of my medical decision making, I reviewed the following data within the [place] notes reviewed and incorporated,  Old chart reviewed, Notes from prior ED visits, and [place] Controlled Substance Database       *[name] was evaluated in Emergency Department on 04/22/2020 for the symptoms described in the history of present illness. He was evaluated in the context of the global COVID-19 pandemic, which necessitated consideration that the patient might be at risk for infection with the SARS-CoV-2 virus that causes COVID-19. Institutional protocols and algorithms that pertain to the evaluation of patients at risk for COVID-19 are in a state of rapid change based on information released by regulatory bodies including the CDC and federal and state organizations. These policies and algorithms were followed during the patient's care in the ED.  Some ED evaluations and interventions may be delayed as a result of limited staffing during the pandemic.*     Medical Decision Making:  84 yo M here with left leg pain, redness, warmth. Exam is concerning for significant cellulitis with streaking redness along medial thigh, concerning for fairly rapid worsening given that borders were drawn just prior to arrival. Pt is tachycardic but otherwise nontoxic, WBC normal, no signs of severe sepsis. LA normal. Renal function at baseline. Will check U/S, XR, start Vanc/Rocephin given his h/o MRSA, and plan to admit.  U/S reviewed by me, shows no DVT. XR without significant knee effusion, clinically I do not suspect septic arthritis. Admit for cellulitis.  ____________________________________________  FINAL CLINICAL IMPRESSION(S) / ED DIAGNOSES  Final diagnoses:  Cellulitis of left leg     MEDICATIONS GIVEN DURING THIS VISIT:  Medications  cefTRIAXone (ROCEPHIN) 2 g in sodium chloride 0.9 % 100 mL IVPB (2 g Intravenous New Bag/Given 04/22/20 1717)  vancomycin (VANCOREADY) IVPB 1750  mg/350 mL (has no administration in time range)  acetaminophen (TYLENOL) tablet 1,000 mg (has no administration in time range)  sodium chloride 0.9 % bolus 1,000 mL (1,000 mLs Intravenous New Bag/Given 04/22/20 1644)  morphine 4 MG/ML injection 4 mg (4 mg Intravenous Given 04/22/20 1712)  ondansetron (ZOFRAN) injection 4 mg (4 mg Intravenous Given 04/22/20 1711)     ED Discharge Orders    None       Note:  This document was prepared using Dragon voice recognition software and may include unintentional dictation errors.   Duffy Bruce, MD 04/22/20 1745

## 2020-04-23 ENCOUNTER — Inpatient Hospital Stay: Admit: 2020-04-23 | Payer: Medicare Other

## 2020-04-23 ENCOUNTER — Inpatient Hospital Stay
Admit: 2020-04-23 | Discharge: 2020-04-23 | Disposition: A | Discharge status: Home / Self Care | Payer: Medicare Other | Attending: Student | Admitting: Student

## 2020-04-23 ENCOUNTER — Telehealth: Payer: Self-pay

## 2020-04-23 ENCOUNTER — Encounter: Payer: Self-pay | Admitting: Student

## 2020-04-23 DIAGNOSIS — E871 Hypo-osmolality and hyponatremia: Secondary | ICD-10-CM

## 2020-04-23 DIAGNOSIS — I10 Essential (primary) hypertension: Secondary | ICD-10-CM

## 2020-04-23 DIAGNOSIS — E876 Hypokalemia: Secondary | ICD-10-CM

## 2020-04-23 LAB — BASIC METABOLIC PANEL
Anion gap: 8 (ref 5–15)
BUN: 14 mg/dL (ref 8–23)
CO2: 26 mmol/L (ref 22–32)
Calcium: 8.5 mg/dL — ABNORMAL LOW (ref 8.9–10.3)
Chloride: 97 mmol/L — ABNORMAL LOW (ref 98–111)
Creatinine, Ser: 0.76 mg/dL (ref 0.61–1.24)
GFR, Estimated: >60 mL/min (ref >60)
Glucose, Bld: 94 mg/dL (ref 70–99)
Potassium: 3.3 mmol/L — ABNORMAL LOW (ref 3.5–5.1)
Sodium: 131 mmol/L — ABNORMAL LOW (ref 135–145)

## 2020-04-23 LAB — CBC
HCT: 25.5 % — ABNORMAL LOW (ref 39.0–52.0)
Hemoglobin: 8.9 g/dL — ABNORMAL LOW (ref 13.0–17.0)
MCH: 37.6 pg — ABNORMAL HIGH (ref 26.0–34.0)
MCHC: 34.9 g/dL (ref 30.0–36.0)
MCV: 107.6 fL — ABNORMAL HIGH (ref 80.0–100.0)
Platelets: 169 10*3/uL (ref 150–400)
RBC: 2.37 MIL/uL — ABNORMAL LOW (ref 4.22–5.81)
RDW: 14.5 % (ref 11.5–15.5)
WBC: 6.3 10*3/uL (ref 4.0–10.5)
nRBC: 0 % (ref 0.0–0.2)

## 2020-04-23 LAB — FERRITIN: Ferritin: 118 ng/mL (ref 24–336)

## 2020-04-23 LAB — SODIUM, URINE, RANDOM: Sodium, Ur: 79 mmol/L

## 2020-04-23 LAB — PHOSPHORUS: Phosphorus: 3.1 mg/dL (ref 2.5–4.6)

## 2020-04-23 LAB — MAGNESIUM: Magnesium: 2.3 mg/dL (ref 1.7–2.4)

## 2020-04-23 LAB — VITAMIN B12: Vitamin B-12: 6201 pg/mL — ABNORMAL HIGH (ref 180–914)

## 2020-04-23 LAB — MRSA PCR SCREENING: MRSA by PCR: POSITIVE — AB

## 2020-04-23 MED ORDER — CHLORHEXIDINE GLUCONATE CLOTH 2 % EX PADS
6.0000 | MEDICATED_PAD | Freq: Every day | CUTANEOUS | Status: DC
  Administered 2020-04-24 – 2020-04-25 (×2): 6 via TOPICAL

## 2020-04-23 MED ORDER — POTASSIUM CHLORIDE CRYS ER 20 MEQ PO TBCR
40.0000 meq | EXTENDED_RELEASE_TABLET | Freq: Once | ORAL | Status: AC
  Administered 2020-04-23: 40 meq via ORAL
  Filled 2020-04-23: qty 2

## 2020-04-23 MED ORDER — VANCOMYCIN HCL IN DEXTROSE 1-5 GM/200ML-% IV SOLN
1000.0000 mg | Freq: Two times a day (BID) | INTRAVENOUS | Status: DC
  Administered 2020-04-23 – 2020-04-25 (×4): 1000 mg via INTRAVENOUS
  Filled 2020-04-23 (×6): qty 200

## 2020-04-23 NOTE — Progress Notes (Signed)
*  PRELIMINARY RESULTS* Echocardiogram 2D Echocardiogram has been performed.  Sherrie Sport 04/23/2020, 2:03 PM

## 2020-04-23 NOTE — Progress Notes (Signed)
DARWIN, ROTHLISBERGER ([id number]) Visit Report for 04/22/2020 HPI Details Patient Name: [name], [name] Date of Service: 04/22/2020 1:30 PM Medical Record Number: [id number] Patient Account Number: [id number] Date of Birth/Sex: February 03, 1936 (84 y.o. M) Treating RN: Cornell Barman Primary Care Provider: [name] Other Clinician: Referring Provider: [name] Treating Provider/Extender: Tito Dine in Treatment: 10 History of Present Illness HPI Description: 02/11/2020 on evaluation today patient appears to be doing somewhat poorly on initial evaluation concerning his heel. He has been using a topical antibiotic ointment which does not seem to really have been doing the best job for him. He tells me that he had a wound that occurred as a result of his padding in the bottom of his shoe wear his foot drop brace goes actually had worn through and his foot was actually rubbing on the bottom of the shoe on the brace itself. Subsequently this ended up with a wound and he in turn had a lot of issues following. Fortunately there is no signs of active infection at this time which is good news in my opinion. With that being said the wound does appear to be somewhat moist with some evidence of maceration unfortunately. He has no pain he does have neuropathy. He also has bilateral foot drop which she wears braces. This does appear to be however a pressure injury secondary to the AFO brace. The patient has been seen by podiatry since May 2021 unfortunately this is not healing hence the reason that he is coming to see [place] at this point to see if there is anything we can do to help in this regard. 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 9/15; the patient missed his appointment last week due to illness with his wife who had to be hospitalized. We are using Hydrofera Blue to the small punched out area in the  middle of the left plantar heel. 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 This is felt to be pressure from the brace itself. 03/05/2020 upon evaluation today patient appears to be doing well in regard to his wound on the heel. He has been tolerating the dressing changes without complication which is great news. Fortunately there is no signs of active infection which I am very happy with. 03/19/2020 upon evaluation today patient appears to be doing a little worse in regard to his heel based on what I am seeing at this point. There actually appears to be a pocket off to the side laterally that is problematic for him at this point. I am not to clear this way with some sharp debridement currently. The patient was in agreement with that plan. 10/20; patient I do not usually see. He has a small wound on the left plantar heel. Some depth. He is apparently completing antibiotics. He has bilateral foot drop and therefore is AFO braces limiting what we can do to offload this. 04/14/2020 upon evaluation today patient's heel actually appears to be doing fairly well. There is no signs of active infection at this time which is great news and overall I feel like he is managing nicely. The only issue he has is that the individual who has been helping change the dressings at home unfortunately has not been available for the past week or so. She is out of town. Nonetheless he has been going to urgent care to have the dressings changed as he is  not able to do this on his own and states that his wife is unable to do it for him. 11/10; patient in today for a nurse visit but I was asked to look at him. I note that we are treating him for a small punched-out area on the left plantar heel. He arrived with a new superficial area close to this wound I am not really clear of the exact etiology of this. The bigger problem was increased swelling warmth of the lower leg below the knee but intense  erythema involving the entire anterior knee and medial thigh. His entire leg is warm and swollen. However it was not completely obvious to me that there was a big change in the lower leg however the nurses and the patient certainly thought there was. The patient stated that after his 2-week of trimethoprim sulfamethoxazole his legs looked very similar. His vital signs are stable he is afebrile but nevertheless if this is all infection this is extensive and failing outpatient management Electronic Signature(s) Signed: 04/23/2020 12:18:17 PM By: Linton Ham MD Entered By: Linton Ham on 04/22/2020 14:22:49 [name] ([id number]) -------------------------------------------------------------------------------- Physical Exam Details Patient Name: [name] Date of Service: 04/22/2020 1:30 PM Medical Record Number: [id number] Patient Account Number: [id number] Date of Birth/Sex: [birth year] (84 y.o. M) Treating RN: Cornell Barman Primary Care Provider: [name] Other Clinician: Referring Provider: [name] Treating Provider/Extender: Tito Dine in Treatment: 10 Constitutional Patient is febrile today.Marland Kitchen appears in no distress. Cardiovascular Dorsalis pedis pulse palpable. Musculoskeletal No fluid in the knee joint. Integumentary (Hair, Skin) Dry flaky skin in the lower extremity.Marland Kitchen Psychiatric No evidence of depression, anxiety, or agitation. Calm, cooperative, and communicative. Appropriate interactions and affect.. Notes Wound exam; the patient's wound on the left plantar foot actually looks quite good. He has a superficial abrasion next to it that was new this week. Nothing look particularly ominous here. However he had very significant swelling and warmth in the left lower leg. Intense erythema over the patellar area of the knee extending mildly into the medial thigh. The area over the knee and the thigh was very tender to the touch Electronic  Signature(s) Signed: 04/23/2020 12:18:17 PM By: Linton Ham MD Entered By: Linton Ham on 04/22/2020 14:30:17 [name] ([id number]) -------------------------------------------------------------------------------- Physician Orders Details Patient Name: [name] Date of Service: 04/22/2020 1:30 PM Medical Record Number: [id number] Patient Account Number: [id number] Date of Birth/Sex: [birth year] (84 y.o. M) Treating RN: Cornell Barman Primary Care Provider: [name] Other Clinician: Referring Provider: [name] Treating Provider/Extender: Tito Dine in Treatment: 10 Verbal / Phone Orders: No Diagnosis Coding Wound Cleansing Wound #1 Left,Plantar Calcaneus o Dial antibacterial soap, wash wounds, rinse and pat dry prior to dressing wounds Skin Barriers/Peri-Wound Care Wound #1 Left,Plantar Calcaneus o Moisturizing lotion - Twice daily Primary Wound Dressing Wound #1 Left,Plantar Calcaneus o Silver Alginate Secondary Dressing Wound #1 Left,Plantar Calcaneus o Conform/Kerlix - Heel cup secured by stretch net Dressing Change Frequency Wound #1 Left,Plantar Calcaneus o Change dressing every other day. Follow-up Appointments Wound #1 Left,Plantar Calcaneus o Return Appointment in 1 week. - Nurse visit to ensure no worsening of the wound o Return Appointment in 2 weeks. - Provider visit Off-Loading Wound #1 Left,Plantar Calcaneus o Other: - keep pressure off of heel Additional Orders / Instructions Wound #1 Left,Plantar Calcaneus o Other: - Go to [facility] ED for evaluation and IV antibiotics. Wound #2 Left,Proximal,Plantar Foot o Other: - Go to [facility] ED for evaluation and IV antibiotics.  Electronic Signature(s) Signed: 04/22/2020 6:49:12 PM By: Gretta Cool, BSN, RN, CWS, Kim RN, BSN Signed: 04/23/2020 12:18:17 PM By: Linton Ham MD Entered By: Gretta Cool, BSN, RN, CWS, Kim on 04/22/2020 14:10:41 [name]  ([id number]) -------------------------------------------------------------------------------- Problem List Details Patient Name: [name] Date of Service: 04/22/2020 1:30 PM Medical Record Number: [id number] Patient Account Number: [id number] Date of Birth/Sex: 25-Dec-1935 (84 y.o. M) Treating RN: Cornell Barman Primary Care Provider: [name] Other Clinician: Referring Provider: [name] Treating Provider/Extender: Tito Dine in Treatment: 10 Active Problems ICD-10 Encounter Code Description Active Date MDM Diagnosis [phone] Pressure ulcer of left heel, stage 3 02/11/2020 No Yes G60.3 Idiopathic progressive neuropathy 02/11/2020 No Yes M21.372 Foot drop, left foot 02/11/2020 No Yes M21.371 Foot drop, right foot 02/11/2020 No Yes L03.116 Cellulitis of left lower limb 04/22/2020 No Yes Inactive Problems Resolved Problems Electronic Signature(s) Signed: 04/23/2020 12:18:17 PM By: Linton Ham MD Entered By: Linton Ham on 04/22/2020 14:18:15 Degregory, [name] ([id number]) -------------------------------------------------------------------------------- Progress Note Details Patient Name: [name] Date of Service: 04/22/2020 1:30 PM Medical Record Number: [id number] Patient Account Number: [id number] Date of Birth/Sex: [birth year] (84 y.o. M) Treating RN: Cornell Barman Primary Care Provider: [name] Other Clinician: Referring Provider: [name] Treating Provider/Extender: Tito Dine in Treatment: 10 Subjective History of Present Illness (HPI) 02/11/2020 on evaluation today patient appears to be doing somewhat poorly on initial evaluation concerning his heel. He has been using a topical antibiotic ointment which does not seem to really have been doing the best job for him. He tells me that he had a wound that occurred as a result of his padding in the bottom of his shoe wear his foot drop brace goes actually had  worn through and his foot was actually rubbing on the bottom of the shoe on the brace itself. Subsequently this ended up with a wound and he in turn had a lot of issues following. Fortunately there is no signs of active infection at this time which is good news in my opinion. With that being said the wound does appear to be somewhat moist with some evidence of maceration unfortunately. He has no pain he does have neuropathy. He also has bilateral foot drop which she wears braces. This does appear to be however a pressure injury secondary to the AFO brace. The patient has been seen by podiatry since May 2021 unfortunately this is not healing hence the reason that he is coming to see [place] at this point to see if there is anything we can do to help in this regard. 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 9/15; the patient missed his appointment last week due to illness with his wife who had to be hospitalized. We are using Hydrofera Blue to the small punched out area in the middle of the left plantar heel. 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 This is felt to be pressure from the brace itself. 03/05/2020 upon evaluation today patient appears to be doing well in regard to his wound on the heel. He has been tolerating the dressing changes without complication which is great news. Fortunately there is no signs of active infection which I am very happy with. 03/19/2020 upon evaluation today patient appears to be doing a little worse in regard to his heel based on what I am seeing at this point. There actually appears to be a pocket  off to the side laterally that is problematic for him at this point. I am not to clear this way with some sharp debridement currently. The patient was in agreement with that plan. 10/20; patient I do not usually see. He has a small wound on the left plantar heel.  Some depth. He is apparently completing antibiotics. He has bilateral foot drop and therefore is AFO braces limiting what we can do to offload this. 04/14/2020 upon evaluation today patient's heel actually appears to be doing fairly well. There is no signs of active infection at this time which is great news and overall I feel like he is managing nicely. The only issue he has is that the individual who has been helping change the dressings at home unfortunately has not been available for the past week or so. She is out of town. Nonetheless he has been going to urgent care to have the dressings changed as he is not able to do this on his own and states that his wife is unable to do it for him. 11/10; patient in today for a nurse visit but I was asked to look at him. I note that we are treating him for a small punched-out area on the left plantar heel. He arrived with a new superficial area close to this wound I am not really clear of the exact etiology of this. The bigger problem was increased swelling warmth of the lower leg below the knee but intense erythema involving the entire anterior knee and medial thigh. His entire leg is warm and swollen. However it was not completely obvious to me that there was a big change in the lower leg however the nurses and the patient certainly thought there was. The patient stated that after his 2-week of trimethoprim sulfamethoxazole his legs looked very similar. His vital signs are stable he is afebrile but nevertheless if this is all infection this is extensive and failing outpatient management Objective Constitutional Patient is febrile today.Marland Kitchen appears in no distress. Vitals Time Taken: 1:50 PM, Height: 72 in, Weight: 200 lbs, BMI: 27.1, Temperature: 98.7 F. Cardiovascular Dorsalis pedis pulse palpable. Musculoskeletal No fluid in the knee joint. Psychiatric No evidence of depression, anxiety, or agitation. Calm, cooperative, and communicative.  Appropriate interactions and affect.. General Notes: Wound exam; the patient's wound on the left plantar foot actually looks quite good. He has a superficial abrasion next to it that was new this week. Nothing look particularly ominous here. However he had very significant swelling and warmth in the left lower leg. Intense erythema over the patellar area of the knee extending mildly into the medial thigh. The area over the knee and the thigh was very tender to the Piedra, Sargon ([id number]) touch Integumentary (Hair, Skin) Dry flaky skin in the lower extremity.. Wound #1 status is Open. Original cause of wound was Trauma. The wound is located on the Left,Plantar Calcaneus. The wound measures 0.3cm length x 0.4cm width x 0.2cm depth; 0.094cm^2 area and 0.019cm^3 volume. There is Fat Layer (Subcutaneous Tissue) exposed. There is a medium amount of serosanguineous drainage noted. The wound margin is flat and intact. There is large (67-100%) pink, pale granulation within the wound bed. There is a small (1-33%) amount of necrotic tissue within the wound bed including Adherent Slough. Wound #2 status is Open. Original cause of wound was Not Known. The wound is located on the Left,Proximal,Plantar Foot. The wound measures 0.5cm length x 0.5cm width x 0.1cm depth; 0.196cm^2 area and 0.02cm^3 volume. There  is no tunneling or undermining noted. Assessment Active Problems ICD-10 Pressure ulcer of left heel, stage 3 Idiopathic progressive neuropathy Foot drop, left foot Foot drop, right foot Cellulitis of left lower limb Plan Wound Cleansing: Wound #1 Left,Plantar Calcaneus: Dial antibacterial soap, wash wounds, rinse and pat dry prior to dressing wounds Skin Barriers/Peri-Wound Care: Wound #1 Left,Plantar Calcaneus: Moisturizing lotion - Twice daily Primary Wound Dressing: Wound #1 Left,Plantar Calcaneus: Silver Alginate Secondary Dressing: Wound #1 Left,Plantar Calcaneus: Conform/Kerlix - Heel  cup secured by stretch net Dressing Change Frequency: Wound #1 Left,Plantar Calcaneus: Change dressing every other day. Follow-up Appointments: Wound #1 Left,Plantar Calcaneus: Return Appointment in 1 week. - Nurse visit to ensure no worsening of the wound Return Appointment in 2 weeks. - Provider visit Off-Loading: Wound #1 Left,Plantar Calcaneus: Other: - keep pressure off of heel Additional Orders / Instructions: Wound #1 Left,Plantar Calcaneus: Other: - Go to [facility] ED for evaluation and IV antibiotics. Wound #2 Left,Proximal,Plantar Foot: Other: - Go to [facility] ED for evaluation and IV antibiotics. 1. I think this patient had cellulitis failing outpatient management. 2. Although I initially looked at his left leg I was not that impressed with the distal lower leg and the nurses who had seen the patient previously said that the swelling was a lot worse than previously. I note that he had a negative DVT rule out on 02/05/2020 however 3. He has recently completed 2 weeks of oral Septra and the patient stated that his edema went way down in his leg. He has been very concerned about the increase in the swelling and the redness of the knee this week 4. I think this patient is failing outpatient management of bacterial cellulitis. I think he will need to be admitted for 2 days of IV antibiotics. Level of suspicion about a DVT is not all that high Electronic Signature(s) JETT, FUKUDA ([id number]) Signed: 04/23/2020 12:18:17 PM By: Linton Ham MD Entered By: Linton Ham on 04/22/2020 14:32:00 Kleman, [name] ([id number]) -------------------------------------------------------------------------------- SuperBill Details Patient Name: [name] Date of Service: 04/22/2020 Medical Record Number: [id number] Patient Account Number: [id number] Date of Birth/Sex: Jun 04, 1936 (84 y.o. M) Treating RN: Cornell Barman Primary Care Provider: [name] Other Clinician: Referring Provider:  [name] Treating Provider/Extender: Tito Dine in Treatment: 10 Diagnosis Coding ICD-10 Codes Code Description [phone] Pressure ulcer of left heel, stage 3 G60.3 Idiopathic progressive neuropathy M21.372 Foot drop, left foot M21.371 Foot drop, right foot L03.116 Cellulitis of left lower limb Facility Procedures CPT4 Code: [id number] Description: 99214 - WOUND CARE VISIT-LEV 4 EST PT Modifier: Quantity: 1 Physician Procedures CPT4 Code: 5732202 Description: 54270 - WC PHYS LEVEL 4 - EST PT Modifier: Quantity: 1 CPT4 Code: Description: ICD-10 Diagnosis Description L03.116 Cellulitis of left lower limb L89.623 Pressure ulcer of left heel, stage 3 G60.3 Idiopathic progressive neuropathy Modifier: Quantity: Electronic Signature(s) Signed: 04/22/2020 6:27:18 PM By: Gretta Cool, BSN, RN, CWS, Kim RN, BSN Signed: 04/23/2020 12:18:17 PM By: Linton Ham MD Entered By: Gretta Cool, BSN, RN, CWS, Kim on 04/22/2020 18:27:18

## 2020-04-23 NOTE — Progress Notes (Signed)
Progress Note    [name]  TZG:[id number] DOB: [birth year]  DOA: 04/22/2020 PCP: [name], DO      Brief Narrative:    Medical records reviewed and are as summarized below:  [name] is a 84 y.o. male       Assessment/Plan:   Principal Problem:   Cellulitis of knee, left Active Problems:   Idiopathic peripheral neuropathy   Essential hypertension   CAD (coronary artery disease)   Chronic systolic CHF (congestive heart failure), NYHA class 2 (HCC)   Hyponatremia   Hypokalemia   Body mass index is 27.8 kg/m.    Cellulitis of the left knee, chronic left heel wound: Continue IV vancomycin and cefepime.  Continue local wound care.  Hyponatremia: Sodium is slightly better.  Urine sodium 79 and urine osmolality 423.  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  Continue to monitor sodium level.  2D echo is pending.  Hypokalemia: Replete potassium and monitor levels  Hypomagnesemia: improved  Other comorbidities include hypertension, hyperlipidemia, CAD, chronic diastolic CHF, peripheral neuropathy, bilateral foot drop      Diet Order            Diet Heart Room service appropriate? Yes; Fluid consistency: Thin; Fluid restriction: 1500 mL Fluid  Diet effective now                    Consultants:  None  Procedures:  None    Medications:   . amLODipine  2.5 mg Oral Daily  . aspirin EC  81 mg Oral Daily  . enoxaparin (LOVENOX) injection  40 mg Subcutaneous Q24H  . gabapentin  1,200 mg Oral BID  . losartan  50 mg Oral Daily  . pantoprazole  40 mg Oral Daily  . potassium chloride  40 mEq Oral Once  . pravastatin  80 mg Oral q1800  . primidone  50 mg Oral TID   Continuous Infusions: . ceFEPime (MAXIPIME) IV    . vancomycin 1,250 mg (04/23/20 0631)     Anti-infectives (From admission, onward)   Start     Dose/Rate Route Frequency Ordered Stop   04/23/20 2000  ceFEPIme (MAXIPIME) 2 g in sodium chloride 0.9 % 100 mL IVPB        2  g 200 mL/hr over 30 Minutes Intravenous Every 8 hours 04/22/20 1842     04/23/20 0600  vancomycin (VANCOCIN) IVPB 1000 mg/200 mL premix  Status:  Discontinued        1,000 mg 200 mL/hr over 60 Minutes Intravenous Every 12 hours 04/22/20 1841 04/22/20 1851   04/23/20 0600  vancomycin (VANCOREADY) IVPB 1250 mg/250 mL        1,250 mg 166.7 mL/hr over 90 Minutes Intravenous Every 12 hours 04/22/20 1855     04/23/20 0000  vancomycin (VANCOCIN) IVPB 1000 mg/200 mL premix  Status:  Discontinued        1,000 mg 200 mL/hr over 60 Minutes Intravenous Every 12 hours 04/22/20 1841 04/22/20 1841   04/22/20 1845  vancomycin (VANCOCIN) IVPB 1000 mg/200 mL premix  Status:  Discontinued        1,000 mg 200 mL/hr over 60 Minutes Intravenous  Once 04/22/20 1841 04/22/20 1841   04/22/20 1845  ceFEPIme (MAXIPIME) 2 g in sodium chloride 0.9 % 100 mL IVPB  Status:  Discontinued        2 g 200 mL/hr over 30 Minutes Intravenous  Once 04/22/20 1841 04/22/20 1842   04/22/20 1615  cefTRIAXone (  ROCEPHIN) 2 g in sodium chloride 0.9 % 100 mL IVPB        2 g 200 mL/hr over 30 Minutes Intravenous  Once 04/22/20 1608 04/22/20 1754   04/22/20 1615  vancomycin (VANCOREADY) IVPB 1750 mg/350 mL        1,750 mg 175 mL/hr over 120 Minutes Intravenous  Once 04/22/20 1610 04/22/20 2017             Family Communication/Anticipated D/C date and plan/Code Status   DVT prophylaxis: enoxaparin (LOVENOX) injection 40 mg Start: 04/22/20 2200     Code Status: Full Code  Family Communication: None Disposition Plan:    Status is: Inpatient  Remains inpatient appropriate because:IV treatments appropriate due to intensity of illness or inability to take PO   Dispo: The patient is from: Home              Anticipated d/c is to: Home              Anticipated d/c date is: 2 days              Patient currently is not medically stable to d/c.           Subjective:   C/o pain in the left knee but he thinks it's  better than it was yesterday.  Objective:    Vitals:   04/22/20 2147 04/23/20 0406 04/23/20 0915 04/23/20 1147  BP: 106/64 113/67 124/63 134/75  Pulse: 86 69 75 75  Resp: 20 16 18 17   Temp: 99.3 F (37.4 C) 98.1 F (36.7 C) 97.8 F (36.6 C) 97.8 F (36.6 C)  TempSrc: Oral Oral Oral Oral  SpO2: 96% 97% 98% 99%  Weight:      Height:       No data found.   Intake/Output Summary (Last 24 hours) at 04/23/2020 1355 Last data filed at 04/23/2020 1200 Gross per 24 hour  Intake 1070 ml  Output 850 ml  Net 220 ml   Filed Weights   04/22/20 1520  Weight: 93 kg    Exam:  GEN: NAD SKIN: Left heel superficial wound EYES: EOMI ENT: MMM CV: RRR PULM: CTA B ABD: soft, ND, NT, +BS CNS: AAO x 3, non focal EXT: Left knee with redness and tenderness over the patella.  Left leg appears slightly bigger than the right.  Fixed flexion deformity of bilateral fingers.         Data Reviewed:   I have personally reviewed following labs and imaging studies:  Labs: Labs show the following:   Basic Metabolic Panel: Recent Labs  Lab 04/22/20 1526 04/23/20 0430  NA 129* 131*  K 3.0* 3.3*  CL 90* 97*  CO2 27 26  GLUCOSE 101* 94  BUN 14 14  CREATININE 0.65 0.76  CALCIUM 9.4 8.5*  MG <0.1* 2.3  PHOS  --  3.1   GFR Estimated Creatinine Clearance: 78.1 mL/min (by C-G formula based on SCr of 0.76 mg/dL). Liver Function Tests: Recent Labs  Lab 04/22/20 1526  AST 18  ALT 15  ALKPHOS 68  BILITOT 1.0  PROT 7.5  ALBUMIN 4.0   No results for input(s): LIPASE, AMYLASE in the last 168 hours. No results for input(s): AMMONIA in the last 168 hours. Coagulation profile No results for input(s): INR, PROTIME in the last 168 hours.  CBC: Recent Labs  Lab 04/22/20 1526 04/23/20 0430  WBC 9.7 6.3  NEUTROABS 8.1*  --   HGB 11.0* 8.9*  HCT 30.9* 25.5*  MCV 104.7* 107.6*  PLT 205 169   Cardiac Enzymes: No results for input(s): CKTOTAL, CKMB, CKMBINDEX, TROPONINI in  the last 168 hours. BNP (last 3 results) No results for input(s): PROBNP in the last 8760 hours. CBG: No results for input(s): GLUCAP in the last 168 hours. D-Dimer: No results for input(s): DDIMER in the last 72 hours. Hgb A1c: No results for input(s): HGBA1C in the last 72 hours. Lipid Profile: No results for input(s): CHOL, HDL, LDLCALC, TRIG, CHOLHDL, LDLDIRECT in the last 72 hours. Thyroid function studies: No results for input(s): TSH, T4TOTAL, T3FREE, THYROIDAB in the last 72 hours.  Invalid input(s): FREET3 Anemia work up: Recent Labs    04/22/20 1524 04/22/20 2055 04/23/20 0430  VITAMINB12  --  6,201*  --   FOLATE 45.0  --   --   FERRITIN  --   --  118  TIBC 291  --   --   IRON 40*  --   --    Sepsis Labs: Recent Labs  Lab 04/22/20 1526 04/22/20 1642 04/23/20 0430  WBC 9.7  --  6.3  LATICACIDVEN 1.0 1.0  --     Microbiology Recent Results (from the past 240 hour(s))  Respiratory Panel by RT PCR (Flu A&B, Covid) - Nasopharyngeal Swab     Status: None   Collection Time: 04/22/20  4:22 PM   Specimen: Nasopharyngeal Swab  Result Value Ref Range Status   SARS Coronavirus 2 by RT PCR NEGATIVE NEGATIVE Final    Comment: (NOTE) SARS-CoV-2 target nucleic acids are NOT DETECTED.  The SARS-CoV-2 RNA is generally detectable in upper respiratoy specimens during the acute phase of infection. The lowest concentration of SARS-CoV-2 viral copies this assay can detect is 131 copies/mL. A negative result does not preclude SARS-Cov-2 infection and should not be used as the sole basis for treatment or other patient management decisions. A negative result may occur with  improper specimen collection/handling, submission of specimen other than nasopharyngeal swab, presence of viral mutation(s) within the areas targeted by this assay, and inadequate number of viral copies (<131 copies/mL). A negative result must be combined with clinical observations, patient history, and  epidemiological information. The expected result is Negative.  Fact Sheet for Patients:  PinkCheek.be  Fact Sheet for Healthcare Providers:  GravelBags.it  This test is no t yet approved or cleared by the [place] FDA and  has been authorized for detection and/or diagnosis of SARS-CoV-2 by FDA under an Emergency Use Authorization (EUA). This EUA will remain  in effect (meaning this test can be used) for the duration of the COVID-19 declaration under Section 564(b)(1) of the Act, 21 U.S.C. section 360bbb-3(b)(1), unless the authorization is terminated or revoked sooner.     Influenza A by PCR NEGATIVE NEGATIVE Final   Influenza B by PCR NEGATIVE NEGATIVE Final    Comment: (NOTE) The Xpert Xpress SARS-CoV-2/FLU/RSV assay is intended as an aid in  the diagnosis of influenza from Nasopharyngeal swab specimens and  should not be used as a sole basis for treatment. Nasal washings and  aspirates are unacceptable for Xpert Xpress SARS-CoV-2/FLU/RSV  testing.  Fact Sheet for Patients: PinkCheek.be  Fact Sheet for Healthcare Providers: GravelBags.it  This test is not yet approved or cleared by the [place] FDA and  has been authorized for detection and/or diagnosis of SARS-CoV-2 by  FDA under an Emergency Use Authorization (EUA). This EUA will remain  in effect (meaning this test can be used) for the duration of  the  Covid-19 declaration under Section 564(b)(1) of the Act, 21  U.S.C. section 360bbb-3(b)(1), unless the authorization is  terminated or revoked. Performed at [facility], [address] [place], [place] [address]   Blood culture (routine x 2)     Status: None (Preliminary result)   Collection Time: 04/22/20  4:42 PM   Specimen: BLOOD  Result Value Ref Range Status   Specimen Description BLOOD BLOOD LEFT HAND  Final   Special Requests    Final    BOTTLES DRAWN AEROBIC AND ANAEROBIC Blood Culture adequate volume   Culture   Final    NO GROWTH < 24 HOURS Performed at [facility], [address], [place], [place] [address]    Report Status PENDING  Incomplete  Blood culture (routine x 2)     Status: None (Preliminary result)   Collection Time: 04/22/20  4:42 PM   Specimen: BLOOD  Result Value Ref Range Status   Specimen Description BLOOD LEFT ANTECUBITAL  Final   Special Requests   Final    BOTTLES DRAWN AEROBIC AND ANAEROBIC Blood Culture adequate volume   Culture   Final    NO GROWTH < 24 HOURS Performed at [facility], [address] [place], [place] [address]    Report Status PENDING  Incomplete    Procedures and diagnostic studies:  US Venous Img Lower Unilateral Left  Result Date: 04/22/2020 CLINICAL DATA:  Left lower extremity pain and edema. EXAM: LEFT LOWER EXTREMITY VENOUS DOPPLER ULTRASOUND TECHNIQUE: Gray-scale sonography with graded compression, as well as color Doppler and duplex ultrasound were performed to evaluate the lower extremity deep venous systems from the level of the common femoral vein and including the common femoral, femoral, profunda femoral, popliteal and calf veins including the posterior tibial, peroneal and gastrocnemius veins when visible. The superficial great saphenous vein was also interrogated. Spectral Doppler was utilized to evaluate flow at rest and with distal augmentation maneuvers in the common femoral, femoral and popliteal veins. COMPARISON:  None. FINDINGS: Contralateral Common Femoral Vein: Respiratory phasicity is normal and symmetric with the symptomatic side. No evidence of thrombus. Normal compressibility. Common Femoral Vein: No evidence of thrombus. Normal compressibility, respiratory phasicity and response to augmentation. Saphenofemoral Junction: No evidence of thrombus. Normal compressibility and flow on color Doppler imaging. Profunda Femoral  Vein: No evidence of thrombus. Normal compressibility and flow on color Doppler imaging. Femoral Vein: No evidence of thrombus. Normal compressibility, respiratory phasicity and response to augmentation. Popliteal Vein: No evidence of thrombus. Normal compressibility, respiratory phasicity and response to augmentation. Calf Veins: No evidence of thrombus. Normal compressibility and flow on color Doppler imaging. Superficial Great Saphenous Vein: No evidence of thrombus. Normal compressibility. Venous Reflux:  None. Other Findings: No evidence of superficial thrombophlebitis or abnormal fluid collection. There are some mildly prominent but nonenlarged left inguinal lymph nodes which may be reactive. IMPRESSION: No evidence of left lower extremity deep venous thrombosis. Electronically Signed   By: Aletta Edouard M.D.   On: 04/22/2020 17:01   DG Knee Complete 4 Views Left  Result Date: 04/22/2020 CLINICAL DATA:  Left knee pain and swelling EXAM: LEFT KNEE - COMPLETE 4+ VIEW COMPARISON:  None. FINDINGS: Frontal, bilateral oblique, lateral views of the left knee are obtained. No fracture, subluxation, or dislocation. Joint spaces are well preserved. No joint effusion. Prepatellar soft tissue edema is noted. IMPRESSION: 1. Prepatellar soft tissue swelling. Otherwise unremarkable left knee. Electronically Signed   By: Diana Eves.D.  On: 04/22/2020 17:29               LOS: 1 day   Andreah Goheen  [facility]   Pager on [email or web address]. If 7PM-7AM, please contact night-coverage at [email or web address]     04/23/2020, 1:55 PM

## 2020-04-23 NOTE — Telephone Encounter (Signed)
That is fine. I am aware he is in the hospital. He can re-schedule and will need future hospital follow-up.  He should keep following with wound center as scheduled  [name], [facility] 04/23/2020, 10:19 AM

## 2020-04-23 NOTE — Telephone Encounter (Signed)
Patient advised.

## 2020-04-23 NOTE — Progress Notes (Signed)
Lab alerted RN of positive MRSA PCR.  Notified MD of patient allergy and could not initiate mupirocin order set. Patient on contact precautions due to MRSA with open wound that is draining.

## 2020-04-23 NOTE — Progress Notes (Signed)
MD notified for dressing orders.  Patient states he gets wound changed at wound clinic. Dressing clean dry and intact.  MD states to place wound consult.

## 2020-04-23 NOTE — Telephone Encounter (Signed)
Copied from [place] [phone]. Topic: General - Inquiry >> Apr 22, 2020  4:59 PM Gillis Ends D wrote: Reason for CRM: Patients wife called and said that he does have cellulitis in that left leg. He has been admitted to the hospital and she isn't sure if he will be able to make it to the appointment on 04-27-2020. The appointment was cancelled. She just wanted Dr. Raliegh Ip to know. She can be called if need to talk with her. Please advise

## 2020-04-23 NOTE — Progress Notes (Signed)
Pharmacy Antibiotic Note  [name] is a 84 y.o. male admitted on 04/22/2020 with cellulitis of the left knee, chronic left heel wound . Patient had redness of anterior left knee 2-3 days ago; worsening redness and pain. Patient is being managed for an ulcer of his L heel at wound care and was sent to ED for evaluation from wound care clinic. [place] left leg no evidence DVT. Patient received vancomycin loading dose and one time dose of ceftriaxone in ED. Pharmacy was consulted for vancomycin and cefepime dosing.   Plan:  1) continue cefepime 2g IV q8h  2) adjust vancomycin dose to 1000 mg IV every 12 hours  Levels as clinically indicated  Ke: 0.069 h-1, T1/2: 10.0 h  Css (calculated): 26.5 / 12.4 mcg/mL  Monitor renal function and adjust dose as clinically indicated  Height: 6' (182.9 cm) Weight: 93 kg (205 lb) IBW/kg (Calculated) : 77.6  Temp (24hrs), Avg:100 F (37.8 C), Min:98.1 F (36.7 C), Max:102.7 F (39.3 C)  Recent Labs  Lab 04/22/20 1526 04/22/20 1642 04/23/20 0430  WBC 9.7  --  6.3  CREATININE 0.65  --  0.76  LATICACIDVEN 1.0 1.0  --     Estimated Creatinine Clearance: 78.1 mL/min (by C-G formula based on SCr of 0.76 mg/dL).    Allergies  Allergen Reactions  . Codeine Nausea And Vomiting  . Indocin [Indomethacin] Other (See Comments)  . Latex Other (See Comments)    Blisters  . Mupirocin     Blisters  . Plavix [Clopidogrel Bisulfate]     GI intolerance, n/v  . Polysporin [Bacitracin-Polymyxin B]     Blisters  . Shellfish Allergy   . Tape   . Neosporin [Neomycin-Bacitracin Zn-Polymyx] Rash  . Other Nausea And Vomiting    mussels    Antimicrobials this admission: Ceftriaxone 11/10 x1 Vancomycin 11/10 >>  Cefepime 11/10 >>  Microbiology results: 11/10 BCx: NG < 24h 11/11 MRSA PCR: pending 11/10 SARS CoV-2: negative 11/10 influenza A/B: negative  Thank you for allowing pharmacy to be a part of this patient's care.  Dallie Piles,  PharmD 04/23/2020 7:22 AM

## 2020-04-23 NOTE — Progress Notes (Signed)
[name], [name] ([id number]) Visit Report for 04/22/2020 Arrival Information Details Patient Name: [name], [name] Date of Service: 04/22/2020 1:30 PM Medical Record Number: [id number] Patient Account Number: [id number] Date of Birth/Sex: 02-08-1936 (84 y.o. M) Treating RN: Dolan Amen Primary Care [place] Athanas: [name] Other Clinician: Referring Delma Drone: [name] Treating Heyden Jaber/Extender: Tito Dine in Treatment: 10 Visit Information History Since Last Visit Pain Present Now: Yes Patient Arrived: Walker Arrival Time: 13:41 Accompanied By: self Transfer Assistance: None Patient Identification Verified: Yes Secondary Verification Process Completed: Yes Patient Requires Transmission-Based Precautions: No Patient Has Alerts: No Electronic Signature(s) Signed: 04/22/2020 4:38:43 PM By: Georges Mouse, Minus Breeding Entered By: Georges Mouse, Minus Breeding on 04/22/2020 14:03:56 [name] ([id number]) -------------------------------------------------------------------------------- Clinic Level of Care Assessment Details Patient Name: [name] Date of Service: 04/22/2020 1:30 PM Medical Record Number: [id number] Patient Account Number: [id number] Date of Birth/Sex: Oct 26, 1935 (84 y.o. M) Treating RN: Cornell Barman Primary Care Lennon Richins: [name] Other Clinician: Referring Neko Mcgeehan: [name] Treating Kavonte Bearse/Extender: Tito Dine in Treatment: 10 Clinic Level of Care Assessment Items TOOL 4 Quantity Score []  - Use when only an EandM is performed on FOLLOW-UP visit 0 ASSESSMENTS - Nursing Assessment / Reassessment X - Reassessment of Co-morbidities (includes updates in patient status) 1 10 X- 1 5 Reassessment of Adherence to Treatment Plan ASSESSMENTS - Wound and Skin Assessment / Reassessment []  - Simple Wound Assessment / Reassessment - one wound 0 X- 2 5 Complex Wound Assessment / Reassessment - multiple wounds []  -  0 Dermatologic / Skin Assessment (not related to wound area) ASSESSMENTS - Focused Assessment []  - Circumferential Edema Measurements - multi extremities 0 []  - 0 Nutritional Assessment / Counseling / Intervention []  - 0 Lower Extremity Assessment (monofilament, tuning fork, pulses) []  - 0 Peripheral Arterial Disease Assessment (using hand held doppler) ASSESSMENTS - Ostomy and/or Continence Assessment and Care []  - Incontinence Assessment and Management 0 []  - 0 Ostomy Care Assessment and Management (repouching, etc.) PROCESS - Coordination of Care X - Simple Patient / Family Education for ongoing care 1 15 []  - 0 Complex (extensive) Patient / Family Education for ongoing care X- 1 10 Staff obtains Programmer, systems, Records, Test Results / Process Orders []  - 0 Staff telephones HHA, Nursing Homes / Clarify orders / etc []  - 0 Routine Transfer to another Facility (non-emergent condition) []  - 0 Routine Hospital Admission (non-emergent condition) []  - 0 New Admissions / Biomedical engineer / Ordering NPWT, Apligraf, etc. X- 1 20 Emergency Hospital Admission (emergent condition) X- 1 10 Simple Discharge Coordination []  - 0 Complex (extensive) Discharge Coordination PROCESS - Special Needs []  - Pediatric / Minor Patient Management 0 []  - 0 Isolation Patient Management []  - 0 Hearing / Language / Visual special needs []  - 0 Assessment of Community assistance (transportation, D/C planning, etc.) []  - 0 Additional assistance / Altered mentation []  - 0 Support Surface(s) Assessment (bed, cushion, seat, etc.) INTERVENTIONS - Wound Cleansing / Measurement [name], [name] ([id number]) []  - 0 Simple Wound Cleansing - one wound X- 2 5 Complex Wound Cleansing - multiple wounds X- 1 5 Wound Imaging (photographs - any number of wounds) []  - 0 Wound Tracing (instead of photographs) []  - 0 Simple Wound Measurement - one wound X- 2 5 Complex Wound Measurement - multiple  wounds INTERVENTIONS - Wound Dressings []  - Small Wound Dressing one or multiple wounds 0 X- 2 15 Medium Wound Dressing one or multiple wounds []  - 0 Large Wound Dressing one or multiple wounds []  -  0 Application of Medications - topical []  - 0 Application of Medications - injection INTERVENTIONS - Miscellaneous []  - External ear exam 0 []  - 0 Specimen Collection (cultures, biopsies, blood, body fluids, etc.) []  - 0 Specimen(s) / Culture(s) sent or taken to Lab for analysis []  - 0 Patient Transfer (multiple staff / Harrel Lemon Lift / Similar devices) []  - 0 Simple Staple / Suture removal (25 or less) []  - 0 Complex Staple / Suture removal (26 or more) []  - 0 Hypo / Hyperglycemic Management (close monitor of Blood Glucose) []  - 0 Ankle / Brachial Index (ABI) - do not check if billed separately X- 1 5 Vital Signs Has the patient been seen at the hospital within the last three years: Yes Total Score: 140 Level Of Care: New/Established - Level 4 Electronic Signature(s) Signed: 04/22/2020 6:49:12 PM By: Gretta Cool, BSN, RN, CWS, Kim RN, BSN Entered By: Gretta Cool, BSN, RN, CWS, Kim on 04/22/2020 18:27:09 [name] ([id number]) -------------------------------------------------------------------------------- Encounter Discharge Information Details Patient Name: [name] Date of Service: 04/22/2020 1:30 PM Medical Record Number: [id number] Patient Account Number: [id number] Date of Birth/Sex: [birth year] (84 y.o. M) Treating RN: Cornell Barman Primary Care Ravyn Nikkel: [name] Other Clinician: Referring Dafna Romo: [name] Treating Islam Eichinger/Extender: Tito Dine in Treatment: 10 Encounter Discharge Information Items Discharge Condition: Unstable Ambulatory Status: Ambulatory Discharge Destination: Hospital Telephoned: No Orders Sent: Yes Transportation: Private Auto Accompanied By: self Schedule Follow-up Appointment: Yes Clinical Summary of  Care: Electronic Signature(s) Signed: 04/22/2020 5:47:47 PM By: Gretta Cool, BSN, RN, CWS, Kim RN, BSN Entered By: Gretta Cool, BSN, RN, CWS, Kim on 04/22/2020 17:47:47 [name] ([id number]) -------------------------------------------------------------------------------- Lower Extremity Assessment Details Patient Name: [name] Date of Service: 04/22/2020 1:30 PM Medical Record Number: [id number] Patient Account Number: [id number] Date of Birth/Sex: Dec 16, 1935 (84 y.o. M) Treating RN: Cornell Barman Primary Care Katianna Mcclenney: [name] Other Clinician: Referring Rishard Delange: [name] Treating Nyshaun Standage/Extender: Tito Dine in Treatment: 10 Vascular Assessment Pulses: Dorsalis Pedis Palpable: [Left:Yes] Electronic Signature(s) Signed: 04/22/2020 6:49:12 PM By: Gretta Cool, BSN, RN, CWS, Kim RN, BSN Entered By: Gretta Cool, BSN, RN, CWS, Kim on 04/22/2020 14:09:18 [name] ([id number]) -------------------------------------------------------------------------------- Multi Wound Chart Details Patient Name: [name] Date of Service: 04/22/2020 1:30 PM Medical Record Number: [id number] Patient Account Number: [id number] Date of Birth/Sex: December 03, 1935 (84 y.o. M) Treating RN: Cornell Barman Primary Care Yatzari Jonsson: [name] Other Clinician: Referring Soriyah Osberg: [name] Treating Boyde Grieco/Extender: Tito Dine in Treatment: 10 Vital Signs Height(in): 72 Pulse(bpm): Weight(lbs): 200 Blood Pressure(mmHg): Body Mass Index(BMI): 27 Temperature(F): 98.7 Respiratory Rate(breaths/min): Photos: [1:No Photos] [2:No Photos] [N/A:N/A] Wound Location: [1:Left, Plantar Calcaneus] [2:Left, Proximal, Plantar Foot] [N/A:N/A] Wounding Event: [1:Trauma] [2:Not Known] [N/A:N/A] Primary Etiology: [1:Trauma, Other] [2:Trauma, Other] [N/A:N/A] Secondary Etiology: [1:Pressure Ulcer] [2:N/A] [N/A:N/A] Comorbid History: [1:Hypertension, Myocardial Infarction,  Hypertension, Myocardial Infarction, N/A Osteoarthritis, Neuropathy] [2:Osteoarthritis, Neuropathy] Date Acquired: [1:10/21/2019] [2:04/22/2020] [N/A:N/A] Weeks of Treatment: [1:10] [2:0] [N/A:N/A] Wound Status: [1:Open] [2:Open] [N/A:N/A] Measurements L x W x D (cm) [1:0.3x0.4x0.2] [2:0.5x0.5x0.1] [N/A:N/A] Area (cm) : [1:0.094] [2:0.196] [N/A:N/A] Volume (cm) : [1:0.019] [2:0.02] [N/A:N/A] % Reduction in Area: [1:52.00%] [2:N/A] [N/A:N/A] % Reduction in Volume: [1:75.90%] [2:N/A] [N/A:N/A] Classification: [1:Full Thickness Without Exposed Support Structures] [2:Full Thickness Without Exposed Support Structures] [N/A:N/A] Exudate Amount: [1:Medium] [2:N/A] [N/A:N/A] Exudate Type: [1:Serosanguineous] [2:N/A] [N/A:N/A] Exudate Color: [1:red, brown] [2:N/A] [N/A:N/A] Wound Margin: [1:Flat and Intact] [2:N/A] [N/A:N/A] Granulation Amount: [1:Large (67-100%)] [2:N/A] [N/A:N/A] Granulation Quality: [1:Pink, Pale] [2:N/A] [N/A:N/A] Necrotic Amount: [1:Small (1-33%)] [2:N/A] [N/A:N/A] Exposed Structures: [1:Fat Layer (Subcutaneous Tissue):  N/A Yes Fascia: No Tendon: No Muscle: No Joint: No Bone: No None] [2:None] [N/A:N/A N/A] Treatment Notes Electronic Signature(s) Signed: 04/23/2020 12:18:17 PM By: Linton Ham MD Entered By: Linton Ham on 04/22/2020 14:20:57 [name] ([id number]) -------------------------------------------------------------------------------- Multi-Disciplinary Care Plan Details Patient Name: [name] Date of Service: 04/22/2020 1:30 PM Medical Record Number: [id number] Patient Account Number: [id number] Date of Birth/Sex: 1935-08-06 (84 y.o. M) Treating RN: Cornell Barman Primary Care Trenise Turay: [name] Other Clinician: Referring Kaileigh Viswanathan: [name] Treating Rhoda Waldvogel/Extender: Tito Dine in Treatment: 10 Active Inactive Necrotic Tissue Nursing Diagnoses: Impaired tissue integrity related to necrotic/devitalized  tissue Knowledge deficit related to management of necrotic/devitalized tissue Goals: Necrotic/devitalized tissue will be minimized in the wound bed Date Initiated: 04/14/2020 Target Resolution Date: 05/14/2020 Goal Status: Active Patient/caregiver will verbalize understanding of reason and process for debridement of necrotic tissue Date Initiated: 04/14/2020 Target Resolution Date: 05/14/2020 Goal Status: Active Interventions: Assess patient pain level pre-, during and post procedure and prior to discharge Provide education on necrotic tissue and debridement process Treatment Activities: Apply topical anesthetic as ordered : 04/14/2020 Excisional debridement : 04/14/2020 Notes: Orientation to the Wound Care Program Nursing Diagnoses: Knowledge deficit related to the wound healing center program Goals: Patient/caregiver will verbalize understanding of the [facility] Program Date Initiated: 02/11/2020 Target Resolution Date: 02/15/2020 Goal Status: Active Interventions: Provide education on orientation to the wound center Notes: Wound/Skin Impairment Nursing Diagnoses: Impaired tissue integrity Knowledge deficit related to ulceration/compromised skin integrity Goals: Patient/caregiver will verbalize understanding of skin care regimen Date Initiated: 02/11/2020 Target Resolution Date: 02/24/2020 Goal Status: Active Interventions: Assess patient/caregiver ability to obtain necessary supplies Assess patient/caregiver ability to perform ulcer/skin care regimen upon admission and as needed Assess ulceration(s) every visit [name], [name] ([id number]) Provide education on ulcer and skin care Treatment Activities: Skin care regimen initiated : 02/11/2020 Notes: Electronic Signature(s) Signed: 04/22/2020 6:49:12 PM By: Gretta Cool, BSN, RN, CWS, Kim RN, BSN Entered By: Gretta Cool, BSN, RN, CWS, Kim on 04/22/2020 14:09:22 [name], [name]  ([id number]) -------------------------------------------------------------------------------- Non-Wound Condition Assessment Details Patient Name: [name] Date of Service: 04/22/2020 1:30 PM Medical Record Number: [id number] Patient Account Number: [id number] Date of Birth/Sex: [birth year] (84 y.o. M) Treating RN: Dolan Amen Primary Care Travonte Byard: [name] Other Clinician: Referring Mosetta Ferdinand: [name] Treating Colan Laymon/Extender: Ricard Dillon Weeks in Treatment: 10 Non-Wound Condition: Condition: Cellulitis Location: Leg Side: Left Photos Electronic Signature(s) Signed: 04/22/2020 4:38:43 PM By: Georges Mouse, Minus Breeding Entered By: Georges Mouse, Minus Breeding on 04/22/2020 14:05:20 [name] ([id number]) -------------------------------------------------------------------------------- Pain Assessment Details Patient Name: [name] Date of Service: 04/22/2020 1:30 PM Medical Record Number: [id number] Patient Account Number: [id number] Date of Birth/Sex: December 10, 1935 (84 y.o. M) Treating RN: Dolan Amen Primary Care Wealthy Danielski: [name] Other Clinician: Referring Anysia Choi: [name] Treating Teddi Badalamenti/Extender: Tito Dine in Treatment: 10 Active Problems Location of Pain Severity and Description of Pain Patient Has Paino Yes Site Locations Pain Location: Generalized Pain Rate the pain. Current Pain Level: 5 Pain Management and Medication Current Pain Management: Electronic Signature(s) Signed: 04/22/2020 4:38:43 PM By: Georges Mouse, Minus Breeding Entered By: Georges Mouse, Minus Breeding on 04/22/2020 13:59:04 [name] ([id number]) -------------------------------------------------------------------------------- Patient/Caregiver Education Details Patient Name: [name] Date of Service: 04/22/2020 1:30 PM Medical Record Number: [id number] Patient Account Number: [id number] Date of Birth/Gender: [birth year] (84 y.o.  M) Treating RN: Cornell Barman Primary Care Physician: [name] Other Clinician: Referring Physician: [name] Treating Physician/Extender: Tito Dine in Treatment: 10 Education Assessment Education Provided To: Patient Education Topics  Provided Notes Patient instructed to go to ED for evaluation and treatment of cellulitis of knee. Patient understands and agrees. Electronic Signature(s) Signed: 04/22/2020 6:49:12 PM By: Gretta Cool, BSN, RN, CWS, Kim RN, BSN Entered By: Gretta Cool, BSN, RN, CWS, Kim on 04/22/2020 17:46:54 [name] ([id number]) -------------------------------------------------------------------------------- Wound Assessment Details Patient Name: [name] Date of Service: 04/22/2020 1:30 PM Medical Record Number: [id number] Patient Account Number: [id number] Date of Birth/Sex: [birth year] (84 y.o. M) Treating RN: Dolan Amen Primary Care Luan Maberry: [name] Other Clinician: Referring Journee Bobrowski: [name] Treating Mareesa Gathright/Extender: Tito Dine in Treatment: 10 Wound Status Wound Number: 1 Primary Etiology: Trauma, Other Wound Location: Left, Plantar Calcaneus Secondary Pressure Ulcer Etiology: Wounding Event: Trauma Wound Status: Open Date Acquired: 10/21/2019 Comorbid Hypertension, Myocardial Infarction, Osteoarthritis, Weeks Of Treatment: 10 History: Neuropathy Clustered Wound: No Wound Measurements Length: (cm) 0.3 Width: (cm) 0.4 Depth: (cm) 0.2 Area: (cm) 0.094 Volume: (cm) 0.019 % Reduction in Area: 52% % Reduction in Volume: 75.9% Epithelialization: None Wound Description Classification: Full Thickness Without Exposed Support Structures Wound Margin: Flat and Intact Exudate Amount: Medium Exudate Type: Serosanguineous Exudate Color: red, brown Foul Odor After Cleansing: No Slough/Fibrino No Wound Bed Granulation Amount: Large (67-100%) Exposed Structure Granulation  Quality: Pink, Pale Fascia Exposed: No Necrotic Amount: Small (1-33%) Fat Layer (Subcutaneous Tissue) Exposed: Yes Necrotic Quality: Adherent Slough Tendon Exposed: No Muscle Exposed: No Joint Exposed: No Bone Exposed: No Treatment Notes Wound #1 (Left, Plantar Calcaneus) Notes S Cell, heel cup secured with stretch net Electronic Signature(s) Signed: 04/22/2020 4:38:43 PM By: Georges Mouse, Minus Breeding Entered By: Georges Mouse, Minus Breeding on 04/22/2020 13:48:06 [name], [name] ([id number]) -------------------------------------------------------------------------------- Wound Assessment Details Patient Name: [name] Date of Service: 04/22/2020 1:30 PM Medical Record Number: [id number] Patient Account Number: [id number] Date of Birth/Sex: 07/22/1935 (84 y.o. M) Treating RN: Dolan Amen Primary Care Abisola Carrero: [name] Other Clinician: Referring Echo Allsbrook: [name] Treating Quintez Maselli/Extender: Tito Dine in Treatment: 10 Wound Status Wound Number: 2 Primary Trauma, Other Etiology: Wound Location: Left, Proximal, Plantar Foot Wound Status: Open Wounding Event: Not Known Comorbid Hypertension, Myocardial Infarction, Osteoarthritis, Date Acquired: 04/22/2020 History: Neuropathy Weeks Of Treatment: 0 Clustered Wound: No Photos Photo Uploaded By: Georges Mouse, Minus Breeding on 04/22/2020 16:57:20 Wound Measurements Length: (cm) 0.5 Width: (cm) 0.5 Depth: (cm) 0.1 Area: (cm) 0.196 Volume: (cm) 0.02 % Reduction in Area: % Reduction in Volume: Epithelialization: None Tunneling: No Undermining: No Wound Description Classification: Full Thickness Without Exposed Support Structu res Treatment Notes Wound #2 (Left, Proximal, Plantar Foot) Notes S Cell, heel cup secured with stretch net Electronic Signature(s) Signed: 04/22/2020 4:38:43 PM By: Georges Mouse, Minus Breeding Entered By: Georges Mouse, Minus Breeding on 04/22/2020 13:56:04 [name]  ([id number]) -------------------------------------------------------------------------------- Vitals Details Patient Name: [name] Date of Service: 04/22/2020 1:30 PM Medical Record Number: [id number] Patient Account Number: [id number] Date of Birth/Sex: May 05, 1936 (84 y.o. M) Treating RN: Dolan Amen Primary Care Dustie Brittle: [name] Other Clinician: Referring Kennisha Qin: [name] Treating Juanantonio Stolar/Extender: Tito Dine in Treatment: 10 Vital Signs Time Taken: 13:50 Temperature (F): 98.7 Height (in): 72 Reference Range: 80 - 120 mg / dl Weight (lbs): 200 Body Mass Index (BMI): 27.1 Electronic Signature(s) Signed: 04/22/2020 4:38:43 PM By: Georges Mouse, Minus Breeding Entered By: Georges Mouse, Minus Breeding on 04/22/2020 13:58:44

## 2020-04-24 DIAGNOSIS — A419 Sepsis, unspecified organism: Principal | ICD-10-CM

## 2020-04-24 DIAGNOSIS — A4102 Sepsis due to Methicillin resistant Staphylococcus aureus: Secondary | ICD-10-CM | POA: Diagnosis present

## 2020-04-24 LAB — PROTEIN ELECTRO, RANDOM URINE
Albumin ELP, Urine: 100 %
Alpha-1-Globulin, U: 0 %
Alpha-2-Globulin, U: 0 %
Beta Globulin, U: 0 %
Gamma Globulin, U: 0 %
Total Protein, Urine: 8.8 mg/dL

## 2020-04-24 LAB — ECHOCARDIOGRAM COMPLETE
AV Mean grad: 2 mmHg
AV Peak grad: 3.9 mmHg
Ao pk vel: 0.99 m/s
Area-P 1/2: 3.5 cm2
Height: 72 in
S' Lateral: 3.85 cm
Weight: 3280 oz

## 2020-04-24 LAB — BASIC METABOLIC PANEL
Anion gap: 5 (ref 5–15)
BUN: 11 mg/dL (ref 8–23)
CO2: 28 mmol/L (ref 22–32)
Calcium: 8.6 mg/dL — ABNORMAL LOW (ref 8.9–10.3)
Chloride: 95 mmol/L — ABNORMAL LOW (ref 98–111)
Creatinine, Ser: 0.66 mg/dL (ref 0.61–1.24)
GFR, Estimated: >60 mL/min (ref >60)
Glucose, Bld: 102 mg/dL — ABNORMAL HIGH (ref 70–99)
Potassium: 3.6 mmol/L (ref 3.5–5.1)
Sodium: 128 mmol/L — ABNORMAL LOW (ref 135–145)

## 2020-04-24 LAB — CBC WITH DIFFERENTIAL/PLATELET
Abs Immature Granulocytes: 0.02 10*3/uL (ref 0.00–0.07)
Basophils Absolute: 0 10*3/uL (ref 0.0–0.1)
Basophils Relative: 0 %
Eosinophils Absolute: 0.3 10*3/uL (ref 0.0–0.5)
Eosinophils Relative: 4 %
HCT: 27.8 % — ABNORMAL LOW (ref 39.0–52.0)
Hemoglobin: 9.7 g/dL — ABNORMAL LOW (ref 13.0–17.0)
Immature Granulocytes: 0 %
Lymphocytes Relative: 15 %
Lymphs Abs: 1.1 10*3/uL (ref 0.7–4.0)
MCH: 38 pg — ABNORMAL HIGH (ref 26.0–34.0)
MCHC: 34.9 g/dL (ref 30.0–36.0)
MCV: 109 fL — ABNORMAL HIGH (ref 80.0–100.0)
Monocytes Absolute: 0.6 10*3/uL (ref 0.1–1.0)
Monocytes Relative: 8 %
Neutro Abs: 5.1 10*3/uL (ref 1.7–7.7)
Neutrophils Relative %: 73 %
Platelets: 209 10*3/uL (ref 150–400)
RBC: 2.55 MIL/uL — ABNORMAL LOW (ref 4.22–5.81)
RDW: 14 % (ref 11.5–15.5)
WBC: 7 10*3/uL (ref 4.0–10.5)
nRBC: 0 % (ref 0.0–0.2)

## 2020-04-24 LAB — MAGNESIUM: Magnesium: 1.8 mg/dL (ref 1.7–2.4)

## 2020-04-24 MED ORDER — KETOROLAC TROMETHAMINE 30 MG/ML IJ SOLN
15.0000 mg | Freq: Once | INTRAMUSCULAR | Status: AC
  Administered 2020-04-24: 15 mg via INTRAVENOUS
  Filled 2020-04-24: qty 1

## 2020-04-24 MED ORDER — POTASSIUM CHLORIDE CRYS ER 20 MEQ PO TBCR
40.0000 meq | EXTENDED_RELEASE_TABLET | Freq: Once | ORAL | Status: AC
  Administered 2020-04-24: 40 meq via ORAL
  Filled 2020-04-24: qty 2

## 2020-04-24 MED ORDER — ACETAMINOPHEN 325 MG PO TABS
650.0000 mg | ORAL_TABLET | Freq: Four times a day (QID) | ORAL | Status: DC | PRN
  Administered 2020-04-24 (×2): 650 mg via ORAL
  Filled 2020-04-24 (×2): qty 2

## 2020-04-24 MED ORDER — MAGNESIUM OXIDE 400 (241.3 MG) MG PO TABS
400.0000 mg | ORAL_TABLET | Freq: Two times a day (BID) | ORAL | Status: DC
  Administered 2020-04-24 – 2020-04-25 (×2): 400 mg via ORAL
  Filled 2020-04-24 (×2): qty 1

## 2020-04-24 MED ORDER — SODIUM CHLORIDE 0.9 % IV SOLN
INTRAVENOUS | Status: DC | PRN
  Administered 2020-04-24: 250 mL via INTRAVENOUS

## 2020-04-24 MED ORDER — SODIUM CHLORIDE 1 G PO TABS
1.0000 g | ORAL_TABLET | Freq: Two times a day (BID) | ORAL | Status: DC
  Administered 2020-04-24 – 2020-04-25 (×3): 1 g via ORAL
  Filled 2020-04-24 (×5): qty 1

## 2020-04-24 NOTE — Progress Notes (Signed)
Pharmacy Antibiotic Note  [name] is a 84 y.o. male admitted on 04/22/2020 with cellulitis of the left knee, chronic left heel wound . Patient had redness of anterior left knee 2-3 days ago; worsening redness and pain. Patient is being managed for an ulcer of his L heel at wound care and was sent to ED for evaluation from wound care clinic. [place] left leg no evidence DVT. Patient received vancomycin loading dose and one time dose of ceftriaxone in ED. Pharmacy was consulted for vancomycin and cefepime dosing. This is day # 3 of broad-spectrum antibiotics  Plan:  1) continue cefepime 2g IV q8h  2) continue vancomycin 1000 mg IV every 12 hours  Levels as clinically indicated  Ke: 0.069 h-1, T1/2: 10.0 h  Css (calculated): 26.5 / 12.4 mcg/mL  Monitor renal function and adjust dose as clinically indicated  Height: 6' (182.9 cm) Weight: 93 kg (205 lb) IBW/kg (Calculated) : 77.6  Temp (24hrs), Avg:98 F (36.7 C), Min:97.5 F (36.4 C), Max:98.5 F (36.9 C)  Recent Labs  Lab 04/22/20 1526 04/22/20 1642 04/23/20 0430 04/24/20 0457  WBC 9.7  --  6.3 7.0  CREATININE 0.65  --  0.76 0.66  LATICACIDVEN 1.0 1.0  --   --     Estimated Creatinine Clearance: 78.1 mL/min (by C-G formula based on SCr of 0.66 mg/dL).    Allergies  Allergen Reactions  . Codeine Nausea And Vomiting  . Indocin [Indomethacin] Other (See Comments)  . Latex Other (See Comments)    Blisters  . Mupirocin     Blisters  . Plavix [Clopidogrel Bisulfate]     GI intolerance, n/v  . Polysporin [Bacitracin-Polymyxin B]     Blisters  . Shellfish Allergy   . Tape   . Neosporin [Neomycin-Bacitracin Zn-Polymyx] Rash  . Other Nausea And Vomiting    mussels    Antimicrobials this admission: Ceftriaxone 11/10 x1 Vancomycin 11/10 >>  Cefepime 11/10 >>  Microbiology results: 11/10 BCx: NG x 2 days 11/11 MRSA PCR: positive 11/10 SARS CoV-2: negative 11/10 influenza A/B: negative  Thank you for allowing pharmacy  to be a part of this patient's care.  Dallie Piles, PharmD 04/24/2020 7:36 AM

## 2020-04-24 NOTE — Care Management Important Message (Signed)
Important Message  Patient Details  Name: [name] MRN: [id number] Date of Birth: [birth year]   Medicare Important Message Given:  Yes  Reviewed with wife, Harland Aguiniga at [phone].   Dannette Barbara 04/24/2020, 12:33 PM

## 2020-04-24 NOTE — Progress Notes (Addendum)
Progress Note    [name]  XTK:[id number] DOB: 12-Aug-1935  DOA: 04/22/2020 PCP: [name], DO      Brief Narrative:    Medical records reviewed and are as summarized below:  [name] is a 84 y.o. male  with Past medical history of HTN, CAD, HLD, CHF, hearing impairment, neuropathy below knee and bilateral hands, moderate TR, chronic hyponatremia, osteoarthritis, left great toe amputation, bilateral foot drops uses a special shoes, chronic left heel ulcer follows at wound care clinic, prostate cancer status post surgical resection in 2004.  He was referred from the wound care clinic to the emergency room because of left knee swelling associated with pain and redness.  He has history of positive MRSA on the right foot wound culture obtained on 03/19/2020.  He was tachycardic and febrile with T-max of 102.7 F.  He was admitted to the hospital for sepsis secondary to left knee cellulitis.  He was treated with empiric IV antibiotics, IV fluids and analgesics.  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  He was started on salt tablets for hyponatremia.    Assessment/Plan:   Principal Problem:   Sepsis (Many) Active Problems:   Idiopathic peripheral neuropathy   Essential hypertension   CAD (coronary artery disease)   Chronic systolic CHF (congestive heart failure), NYHA class 2 (HCC)   Hyponatremia   Cellulitis of knee, left   Hypokalemia   Body mass index is 27.8 kg/m.    Sepsis secondary to cellulitis of the left knee, chronic left heel wound: Continue IV vancomycin and cefepime.  Continue local wound care.  Hyponatremia: Sodium level dropped from 131-128.  Urine sodium 79 and urine osmolality 423.  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  Start salt tablets.  2D echo showed EF estimated at 60 to 65%, normal LV diastolic parameters, moderately enlarged right ventricle.  Hypokalemia: Improved.  Continue potassium repletion and monitor  levels.  Hypomagnesemia: improved  Other comorbidities include hypertension, hyperlipidemia, CAD, chronic diastolic CHF, peripheral neuropathy, bilateral foot drop      Diet Order            Diet Heart Room service appropriate? Yes; Fluid consistency: Thin; Fluid restriction: 1500 mL Fluid  Diet effective now                    Consultants:  None  Procedures:  None    Medications:   . amLODipine  2.5 mg Oral Daily  . aspirin EC  81 mg Oral Daily  . Chlorhexidine Gluconate Cloth  6 each Topical Q0600  . enoxaparin (LOVENOX) injection  40 mg Subcutaneous Q24H  . gabapentin  1,200 mg Oral BID  . losartan  50 mg Oral Daily  . magnesium oxide  400 mg Oral BID  . pantoprazole  40 mg Oral Daily  . potassium chloride  40 mEq Oral Once  . pravastatin  80 mg Oral q1800  . primidone  50 mg Oral TID  . sodium chloride  1 g Oral BID WC   Continuous Infusions: . ceFEPime (MAXIPIME) IV Stopped (04/24/20 0530)  . vancomycin Stopped (04/24/20 0732)     Anti-infectives (From admission, onward)   Start     Dose/Rate Route Frequency Ordered Stop   04/23/20 2000  ceFEPIme (MAXIPIME) 2 g in sodium chloride 0.9 % 100 mL IVPB        2 g 200 mL/hr over 30 Minutes Intravenous Every 8 hours 04/22/20 1842  04/23/20 1800  vancomycin (VANCOCIN) IVPB 1000 mg/200 mL premix        1,000 mg 200 mL/hr over 60 Minutes Intravenous Every 12 hours 04/23/20 1502     04/23/20 0600  vancomycin (VANCOCIN) IVPB 1000 mg/200 mL premix  Status:  Discontinued        1,000 mg 200 mL/hr over 60 Minutes Intravenous Every 12 hours 04/22/20 1841 04/22/20 1851   04/23/20 0600  vancomycin (VANCOREADY) IVPB 1250 mg/250 mL  Status:  Discontinued        1,250 mg 166.7 mL/hr over 90 Minutes Intravenous Every 12 hours 04/22/20 1855 04/23/20 1502   04/23/20 0000  vancomycin (VANCOCIN) IVPB 1000 mg/200 mL premix  Status:  Discontinued        1,000 mg 200 mL/hr over 60 Minutes Intravenous Every 12 hours  04/22/20 1841 04/22/20 1841   04/22/20 1845  vancomycin (VANCOCIN) IVPB 1000 mg/200 mL premix  Status:  Discontinued        1,000 mg 200 mL/hr over 60 Minutes Intravenous  Once 04/22/20 1841 04/22/20 1841   04/22/20 1845  ceFEPIme (MAXIPIME) 2 g in sodium chloride 0.9 % 100 mL IVPB  Status:  Discontinued        2 g 200 mL/hr over 30 Minutes Intravenous  Once 04/22/20 1841 04/22/20 1842   04/22/20 1615  cefTRIAXone (ROCEPHIN) 2 g in sodium chloride 0.9 % 100 mL IVPB        2 g 200 mL/hr over 30 Minutes Intravenous  Once 04/22/20 1608 04/22/20 1754   04/22/20 1615  vancomycin (VANCOREADY) IVPB 1750 mg/350 mL        1,750 mg 175 mL/hr over 120 Minutes Intravenous  Once 04/22/20 1610 04/22/20 2017             Family Communication/Anticipated D/C date and plan/Code Status   DVT prophylaxis: enoxaparin (LOVENOX) injection 40 mg Start: 04/22/20 2200     Code Status: Full Code  Family Communication: None Disposition Plan:    Status is: Inpatient  Remains inpatient appropriate because:IV treatments appropriate due to intensity of illness or inability to take PO   Dispo: The patient is from: Home              Anticipated d/c is to: Home              Anticipated d/c date is: 2 days              Patient currently is not medically stable to d/c.           Subjective:   He still has pain in the left knee.  Objective:    Vitals:   04/23/20 2345 04/24/20 0451 04/24/20 0815 04/24/20 1144  BP: 117/71 (!) 156/97 119/71 (!) 144/71  Pulse: 79 88 72 78  Resp: 17 18 16 14   Temp: 98.1 F (36.7 C) (!) 97.5 F (36.4 C) (!) 97.5 F (36.4 C) 98 F (36.7 C)  TempSrc: Oral Oral Oral Oral  SpO2: 100% 97% 97% 100%  Weight:      Height:       No data found.   Intake/Output Summary (Last 24 hours) at 04/24/2020 1511 Last data filed at 04/24/2020 0800 Gross per 24 hour  Intake 1192.51 ml  Output 750 ml  Net 442.51 ml   Filed Weights   04/22/20 1520  Weight: 93 kg     Exam:   GEN: NAD SKIN: Warm and dry EYES: EOMI ENT: MMM.  Hard of hearing.  CV: RRR PULM: CTA B ABD: soft, ND, NT, +BS CNS: AAO x 3, non focal EXT: Left knee with redness, mild swelling and tenderness over the patella.  Left leg looks bigger than the right.  Fixed flexion deformity of bilateral fingers.          Data Reviewed:   I have personally reviewed following labs and imaging studies:  Labs: Labs show the following:   Basic Metabolic Panel: Recent Labs  Lab 04/22/20 1526 04/22/20 1526 04/23/20 0430 04/24/20 0457  NA 129*  --  131* 128*  K 3.0*   < > 3.3* 3.6  CL 90*  --  97* 95*  CO2 27  --  26 28  GLUCOSE 101*  --  94 102*  BUN 14  --  14 11  CREATININE 0.65  --  0.76 0.66  CALCIUM 9.4  --  8.5* 8.6*  MG <0.1*  --  2.3 1.8  PHOS  --   --  3.1  --    < > = values in this interval not displayed.   GFR Estimated Creatinine Clearance: 78.1 mL/min (by C-G formula based on SCr of 0.66 mg/dL). Liver Function Tests: Recent Labs  Lab 04/22/20 1526  AST 18  ALT 15  ALKPHOS 68  BILITOT 1.0  PROT 7.5  ALBUMIN 4.0   No results for input(s): LIPASE, AMYLASE in the last 168 hours. No results for input(s): AMMONIA in the last 168 hours. Coagulation profile No results for input(s): INR, PROTIME in the last 168 hours.  CBC: Recent Labs  Lab 04/22/20 1526 04/23/20 0430 04/24/20 0457  WBC 9.7 6.3 7.0  NEUTROABS 8.1*  --  5.1  HGB 11.0* 8.9* 9.7*  HCT 30.9* 25.5* 27.8*  MCV 104.7* 107.6* 109.0*  PLT 205 169 209   Cardiac Enzymes: No results for input(s): CKTOTAL, CKMB, CKMBINDEX, TROPONINI in the last 168 hours. BNP (last 3 results) No results for input(s): PROBNP in the last 8760 hours. CBG: No results for input(s): GLUCAP in the last 168 hours. D-Dimer: No results for input(s): DDIMER in the last 72 hours. Hgb A1c: No results for input(s): HGBA1C in the last 72 hours. Lipid Profile: No results for input(s): CHOL, HDL, LDLCALC, TRIG,  CHOLHDL, LDLDIRECT in the last 72 hours. Thyroid function studies: No results for input(s): TSH, T4TOTAL, T3FREE, THYROIDAB in the last 72 hours.  Invalid input(s): FREET3 Anemia work up: Recent Labs    04/22/20 1524 04/22/20 2055 04/23/20 0430  VITAMINB12  --  6,201*  --   FOLATE 45.0  --   --   FERRITIN  --   --  118  TIBC 291  --   --   IRON 40*  --   --    Sepsis Labs: Recent Labs  Lab 04/22/20 1526 04/22/20 1642 04/23/20 0430 04/24/20 0457  WBC 9.7  --  6.3 7.0  LATICACIDVEN 1.0 1.0  --   --     Microbiology Recent Results (from the past 240 hour(s))  Respiratory Panel by RT PCR (Flu A&B, Covid) - Nasopharyngeal Swab     Status: None   Collection Time: 04/22/20  4:22 PM   Specimen: Nasopharyngeal Swab  Result Value Ref Range Status   SARS Coronavirus 2 by RT PCR NEGATIVE NEGATIVE Final    Comment: (NOTE) SARS-CoV-2 target nucleic acids are NOT DETECTED.  The SARS-CoV-2 RNA is generally detectable in upper respiratoy specimens during the acute phase of infection. The lowest concentration of SARS-CoV-2 viral copies this  assay can detect is 131 copies/mL. A negative result does not preclude SARS-Cov-2 infection and should not be used as the sole basis for treatment or other patient management decisions. A negative result may occur with  improper specimen collection/handling, submission of specimen other than nasopharyngeal swab, presence of viral mutation(s) within the areas targeted by this assay, and inadequate number of viral copies (<131 copies/mL). A negative result must be combined with clinical observations, patient history, and epidemiological information. The expected result is Negative.  Fact Sheet for Patients:  PinkCheek.be  Fact Sheet for Healthcare Providers:  GravelBags.it  This test is no t yet approved or cleared by the [place] FDA and  has been authorized for detection and/or  diagnosis of SARS-CoV-2 by FDA under an Emergency Use Authorization (EUA). This EUA will remain  in effect (meaning this test can be used) for the duration of the COVID-19 declaration under Section 564(b)(1) of the Act, 21 U.S.C. section 360bbb-3(b)(1), unless the authorization is terminated or revoked sooner.     Influenza A by PCR NEGATIVE NEGATIVE Final   Influenza B by PCR NEGATIVE NEGATIVE Final    Comment: (NOTE) The Xpert Xpress SARS-CoV-2/FLU/RSV assay is intended as an aid in  the diagnosis of influenza from Nasopharyngeal swab specimens and  should not be used as a sole basis for treatment. Nasal washings and  aspirates are unacceptable for Xpert Xpress SARS-CoV-2/FLU/RSV  testing.  Fact Sheet for Patients: PinkCheek.be  Fact Sheet for Healthcare Providers: GravelBags.it  This test is not yet approved or cleared by the [place] FDA and  has been authorized for detection and/or diagnosis of SARS-CoV-2 by  FDA under an Emergency Use Authorization (EUA). This EUA will remain  in effect (meaning this test can be used) for the duration of the  Covid-19 declaration under Section 564(b)(1) of the Act, 21  U.S.C. section 360bbb-3(b)(1), unless the authorization is  terminated or revoked. Performed at [facility], [address] [place], [place] [address]   Blood culture (routine x 2)     Status: None (Preliminary result)   Collection Time: 04/22/20  4:42 PM   Specimen: BLOOD  Result Value Ref Range Status   Specimen Description BLOOD BLOOD LEFT HAND  Final   Special Requests   Final    BOTTLES DRAWN AEROBIC AND ANAEROBIC Blood Culture adequate volume   Culture   Final    NO GROWTH 2 DAYS Performed at [facility], [address] [place], [place] [address]    Report Status PENDING  Incomplete  Blood culture (routine x 2)     Status: None (Preliminary result)   Collection Time: 04/22/20   4:42 PM   Specimen: BLOOD  Result Value Ref Range Status   Specimen Description BLOOD LEFT ANTECUBITAL  Final   Special Requests   Final    BOTTLES DRAWN AEROBIC AND ANAEROBIC Blood Culture adequate volume   Culture   Final    NO GROWTH 2 DAYS Performed at [facility], [address], [place], [place] [address]    Report Status PENDING  Incomplete  MRSA PCR Screening     Status: Abnormal   Collection Time: 04/23/20  3:10 PM   Specimen: Nasopharyngeal  Result Value Ref Range Status   MRSA by PCR POSITIVE (A) NEGATIVE Final    Comment:        The GeneXpert MRSA Assay (FDA approved for NASAL specimens only), is one component of a comprehensive MRSA colonization surveillance program. It  is not intended to diagnose MRSA infection nor to guide or monitor treatment for MRSA infections. RESULT CALLED TO, READ BACK BY AND VERIFIED WITH: Josefine Class 04/23/20 1639 KLW Performed at [facility], [address] [place], [place] [address]     Procedures and diagnostic studies:  US Venous Img Lower Unilateral Left  Result Date: 04/22/2020 CLINICAL DATA:  Left lower extremity pain and edema. EXAM: LEFT LOWER EXTREMITY VENOUS DOPPLER ULTRASOUND TECHNIQUE: Gray-scale sonography with graded compression, as well as color Doppler and duplex ultrasound were performed to evaluate the lower extremity deep venous systems from the level of the common femoral vein and including the common femoral, femoral, profunda femoral, popliteal and calf veins including the posterior tibial, peroneal and gastrocnemius veins when visible. The superficial great saphenous vein was also interrogated. Spectral Doppler was utilized to evaluate flow at rest and with distal augmentation maneuvers in the common femoral, femoral and popliteal veins. COMPARISON:  None. FINDINGS: Contralateral Common Femoral Vein: Respiratory phasicity is normal and symmetric with the symptomatic side. No evidence of  thrombus. Normal compressibility. Common Femoral Vein: No evidence of thrombus. Normal compressibility, respiratory phasicity and response to augmentation. Saphenofemoral Junction: No evidence of thrombus. Normal compressibility and flow on color Doppler imaging. Profunda Femoral Vein: No evidence of thrombus. Normal compressibility and flow on color Doppler imaging. Femoral Vein: No evidence of thrombus. Normal compressibility, respiratory phasicity and response to augmentation. Popliteal Vein: No evidence of thrombus. Normal compressibility, respiratory phasicity and response to augmentation. Calf Veins: No evidence of thrombus. Normal compressibility and flow on color Doppler imaging. Superficial Great Saphenous Vein: No evidence of thrombus. Normal compressibility. Venous Reflux:  None. Other Findings: No evidence of superficial thrombophlebitis or abnormal fluid collection. There are some mildly prominent but nonenlarged left inguinal lymph nodes which may be reactive. IMPRESSION: No evidence of left lower extremity deep venous thrombosis. Electronically Signed   By: Aletta Edouard M.D.   On: 04/22/2020 17:01   DG Knee Complete 4 Views Left  Result Date: 04/22/2020 CLINICAL DATA:  Left knee pain and swelling EXAM: LEFT KNEE - COMPLETE 4+ VIEW COMPARISON:  None. FINDINGS: Frontal, bilateral oblique, lateral views of the left knee are obtained. No fracture, subluxation, or dislocation. Joint spaces are well preserved. No joint effusion. Prepatellar soft tissue edema is noted. IMPRESSION: 1. Prepatellar soft tissue swelling. Otherwise unremarkable left knee. Electronically Signed   By: Randa Ngo M.D.   On: 04/22/2020 17:29   ECHOCARDIOGRAM COMPLETE  Result Date: 04/24/2020    ECHOCARDIOGRAM REPORT   Patient Name:   [name]  Date of Exam: 04/23/2020 Medical Rec #:  [id number]  Height:       72.0 in Accession #:    [id number] Weight:       205.0 lb Date of Birth:  09-Sep-1935  BSA:          2.153 m  Patient Age:    89 years   BP:           124/63 mmHg Patient Gender: M          HR:           75 bpm. Exam Location:  [facility] Procedure: 2D Echo, Cardiac Doppler and Color Doppler Indications:     CHF- acute systolic 542.70  History:         Patient has no prior history of Echocardiogram examinations.                  Previous  Myocardial Infarction; Risk Factors:Hypertension and                  Dyslipidemia.  Sonographer:     Sherrie Sport RDCS (AE) Referring Phys:  [id number] Val Riles Diagnosing Phys: Yolonda Kida MD IMPRESSIONS  1. Left ventricular ejection fraction, by estimation, is 60 to 65%. The left ventricle has normal function. The left ventricle has no regional wall motion abnormalities. Left ventricular diastolic parameters were normal.  2. Right ventricular systolic function is normal. The right ventricular size is moderately enlarged.  3. The mitral valve is grossly normal. No evidence of mitral valve regurgitation.  4. The aortic valve is grossly normal. Aortic valve regurgitation is not visualized. FINDINGS  Left Ventricle: Left ventricular ejection fraction, by estimation, is 60 to 65%. The left ventricle has normal function. The left ventricle has no regional wall motion abnormalities. The left ventricular internal cavity size was normal in size. There is  no left ventricular hypertrophy. Left ventricular diastolic parameters were normal. Right Ventricle: The right ventricular size is moderately enlarged. No increase in right ventricular wall thickness. Right ventricular systolic function is normal. Left Atrium: Left atrial size was normal in size. Right Atrium: Right atrial size was normal in size. Pericardium: There is no evidence of pericardial effusion. Mitral Valve: The mitral valve is grossly normal. No evidence of mitral valve regurgitation. Tricuspid Valve: The tricuspid valve is normal in structure. Tricuspid valve regurgitation is trivial. Aortic Valve: The aortic valve is grossly normal.  Aortic valve regurgitation is not visualized. Aortic valve mean gradient measures 2.0 mmHg. Aortic valve peak gradient measures 3.9 mmHg. Pulmonic Valve: The pulmonic valve was grossly normal. Pulmonic valve regurgitation is not visualized. Aorta: The aortic root is normal in size and structure. IAS/Shunts: No atrial level shunt detected by color flow Doppler.  LEFT VENTRICLE PLAX 2D LVIDd:         6.01 cm Diastology LVIDs:         3.85 cm LV e' medial:    6.42 cm/s LV PW:         1.50 cm LV E/e' medial:  12.4 LV IVS:        0.73 cm LV e' lateral:   12.10 cm/s                        LV E/e' lateral: 6.6  RIGHT VENTRICLE RV Basal diam:  5.04 cm RV S prime:     11.70 cm/s TAPSE (M-mode): 3.5 cm LEFT ATRIUM             Index       RIGHT ATRIUM           Index LA diam:        4.80 cm 2.23 cm/m  RA Area:     24.10 cm LA Vol (A2C):   69.2 ml 32.14 ml/m RA Volume:   74.50 ml  34.60 ml/m LA Vol (A4C):   90.5 ml 42.03 ml/m LA Biplane Vol: 85.8 ml 39.85 ml/m  AORTIC VALVE AV Vmax:           99.05 cm/s AV Vmean:          69.800 cm/s AV VTI:            0.201 m AV Peak Grad:      3.9 mmHg AV Mean Grad:      2.0 mmHg LVOT Vmax:         108.00 cm/s  LVOT Vmean:        67.800 cm/s LVOT VTI:          0.206 m LVOT/AV VTI ratio: 1.02  AORTA Ao Root diam: 3.40 cm MITRAL VALVE               TRICUSPID VALVE MV Area (PHT): 3.50 cm    TR Peak grad:   28.9 mmHg MV Decel Time: 217 msec    TR Vmax:        269.00 cm/s MV E velocity: 79.30 cm/s MV A velocity: 79.70 cm/s  SHUNTS MV E/A ratio:  0.99        Systemic VTI: 0.21 m Yolonda Kida MD Electronically signed by Yolonda Kida MD Signature Date/Time: 04/24/2020/11:35:00 AM    Final                LOS: 2 days   Jaziya Obarr  [facility]   Pager on [email or web address]. If 7PM-7AM, please contact night-coverage at [email or web address]     04/24/2020, 3:11 PM

## 2020-04-24 NOTE — Consult Note (Signed)
[facility] Nurse Consult Note: Reason for Consult: Consult requested for left heel.  Pt states he is followed by the outpatient wound care center and they have ordered Silver alginate dressings to be changed 3 times a week.  Wound type: 2 healing full thickness wounds to left plantar heel .3X.3X.1cm, red and moist, 1X.3X.1cm, red and moist.  Small amt tan drainage, no odor or fluctuance. Dressing procedure/placement/frequency: Topical treatment orders provided for bedside nurses to perform as follows: Float heel to reduce pressure. Continue present plan of care as previously ordered by the wound care center: Bedside nurse; please change dressing to left heel wounds Q M/W/F as follow:  moisten previous dressing with NS to assist with removal.  Apply a piece of Aquacel (# [id number]) over wounds, then cover with 4X4, then foam heel cup and kerlex.  Pt should resume followup with the outpatient wound care center after discharge. Please re-consult if further assistance is needed.  Thank-you,  Julien Girt MSN, [place], [place], [place], [place]

## 2020-04-25 LAB — BASIC METABOLIC PANEL
Anion gap: 10 (ref 5–15)
BUN: 14 mg/dL (ref 8–23)
CO2: 23 mmol/L (ref 22–32)
Calcium: 8.5 mg/dL — ABNORMAL LOW (ref 8.9–10.3)
Chloride: 100 mmol/L (ref 98–111)
Creatinine, Ser: 0.65 mg/dL (ref 0.61–1.24)
GFR, Estimated: >60 mL/min (ref >60)
Glucose, Bld: 89 mg/dL (ref 70–99)
Potassium: 3.9 mmol/L (ref 3.5–5.1)
Sodium: 133 mmol/L — ABNORMAL LOW (ref 135–145)

## 2020-04-25 LAB — MAGNESIUM: Magnesium: 1.8 mg/dL (ref 1.7–2.4)

## 2020-04-25 MED ORDER — SODIUM CHLORIDE 1 G PO TABS: 1.0000 g | ORAL_TABLET | Freq: Two times a day (BID) | ORAL | 0 refills | Status: AC

## 2020-04-25 MED ORDER — CLINDAMYCIN HCL 300 MG PO CAPS: 300.0000 mg | ORAL_CAPSULE | Freq: Three times a day (TID) | ORAL | 0 refills | Status: DC

## 2020-04-25 MED ORDER — CLINDAMYCIN HCL 150 MG PO CAPS
300.0000 mg | ORAL_CAPSULE | Freq: Three times a day (TID) | ORAL | Status: DC
  Filled 2020-04-25 (×3): qty 2

## 2020-04-25 NOTE — Discharge Summary (Addendum)
Physician Discharge Summary  [name] UKG:[id number] DOB: 1936-06-11 DOA: 04/22/2020  PCP: [name], DO  Admit date: 04/22/2020 Discharge date: 04/25/2020  Discharge disposition: Home with home health PT and RN   Recommendations for Outpatient Follow-Up:   Follow up with PCP in 1 week   Discharge Diagnosis:   Principal Problem:   Sepsis ([place]) Active Problems:   Idiopathic peripheral neuropathy   Essential hypertension   CAD (coronary artery disease)   Chronic systolic CHF (congestive heart failure), NYHA class 2 (HCC)   Hyponatremia   Cellulitis of knee, left   Hypokalemia    Discharge Condition: Stable.  Diet recommendation:  Diet Order            Diet regular Room service appropriate? Yes; Fluid consistency: Thin  Diet effective now           Diet - low sodium heart healthy                   Code Status: Full Code     Hospital Course:   Mr. [name] is a 84 y.o. 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  He was referred from the wound care clinic to the emergency room because of left knee swelling associated with pain and redness.  He has history of positive MRSA and Pseudomonas aeruginosa isolated from the right foot wound culture obtained on 03/19/2020.  He completed treatment with Levaquin and Bactrim for left leg cellulitis about 2 weeks prior to this admission.  He was tachycardic and febrile with T-max of 102.7 F.  He was admitted to the hospital for sepsis secondary to left knee cellulitis.  He was treated with empiric IV antibiotics, IV fluids and analgesics.  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  He was  started on salt tablets for hyponatremia.  He haD hypokalemia that was repleted.  His condition has improved and he's deemed stable for discharge to home today.  He was evaluated by PT who recommended home health therapy.      Discharge Exam:    Vitals:   04/25/20 0427 04/25/20 0511 04/25/20 0804 04/25/20 1202  BP: 128/77 125/71 128/84 122/69  Pulse: 72  70 70  Resp: 16     Temp: 98 F (36.7 C) (!) 97.4 F (36.3 C) 98 F (36.7 C) 98.7 F (37.1 C)  TempSrc: Oral Oral Oral Axillary  SpO2: 96%  98% 99%  Weight:      Height:         GEN: NAD SKIN: Warm and dry.  Chronic wound on the heel of the left foot EYES: EOMI ENT: MMM CV: RRR PULM: CTA B ABD: soft, ND, NT, +BS CNS: AAO x 3, non focal EXT: Left knee erythema, swelling and tenderness improved.  Flexion deformity of fingers.            The results of significant diagnostics from this hospitalization (including imaging, microbiology, ancillary and laboratory) are listed below for reference.     Procedures and Diagnostic Studies:   ECHOCARDIOGRAM COMPLETE  Result Date: 04/24/2020    ECHOCARDIOGRAM REPORT   Patient Name:   [name]  Date of Exam: 04/23/2020 Medical Rec #:  [id number]  Height:  72.0 in Accession #:    [id number] Weight:       205.0 lb Date of Birth:  1935/11/09  BSA:          2.153 m Patient Age:    62 years   BP:           124/63 mmHg Patient Gender: M          HR:           75 bpm. Exam Location:  [facility] Procedure: 2D Echo, Cardiac Doppler and Color Doppler Indications:     CHF- acute systolic 098.11  History:         Patient has no prior history of Echocardiogram examinations.                  Previous Myocardial Infarction; Risk Factors:Hypertension and                  Dyslipidemia.  Sonographer:     Sherrie Sport RDCS (AE) Referring Phys:  [id number] Val Riles Diagnosing Phys: Yolonda Kida MD IMPRESSIONS  1. Left ventricular ejection fraction, by estimation, is 60 to 65%. The left ventricle  has normal function. The left ventricle has no regional wall motion abnormalities. Left ventricular diastolic parameters were normal.  2. Right ventricular systolic function is normal. The right ventricular size is moderately enlarged.  3. The mitral valve is grossly normal. No evidence of mitral valve regurgitation.  4. The aortic valve is grossly normal. Aortic valve regurgitation is not visualized. FINDINGS  Left Ventricle: Left ventricular ejection fraction, by estimation, is 60 to 65%. The left ventricle has normal function. The left ventricle has no regional wall motion abnormalities. The left ventricular internal cavity size was normal in size. There is  no left ventricular hypertrophy. Left ventricular diastolic parameters were normal. Right Ventricle: The right ventricular size is moderately enlarged. No increase in right ventricular wall thickness. Right ventricular systolic function is normal. Left Atrium: Left atrial size was normal in size. Right Atrium: Right atrial size was normal in size. Pericardium: There is no evidence of pericardial effusion. Mitral Valve: The mitral valve is grossly normal. No evidence of mitral valve regurgitation. Tricuspid Valve: The tricuspid valve is normal in structure. Tricuspid valve regurgitation is trivial. Aortic Valve: The aortic valve is grossly normal. Aortic valve regurgitation is not visualized. Aortic valve mean gradient measures 2.0 mmHg. Aortic valve peak gradient measures 3.9 mmHg. Pulmonic Valve: The pulmonic valve was grossly normal. Pulmonic valve regurgitation is not visualized. Aorta: The aortic root is normal in size and structure. IAS/Shunts: No atrial level shunt detected by color flow Doppler.  LEFT VENTRICLE PLAX 2D LVIDd:         6.01 cm Diastology LVIDs:         3.85 cm LV e' medial:    6.42 cm/s LV PW:         1.50 cm LV E/e' medial:  12.4 LV IVS:        0.73 cm LV e' lateral:   12.10 cm/s                        LV E/e' lateral: 6.6  RIGHT  VENTRICLE RV Basal diam:  5.04 cm RV S prime:     11.70 cm/s TAPSE (M-mode): 3.5 cm LEFT ATRIUM             Index       RIGHT ATRIUM  Index LA diam:        4.80 cm 2.23 cm/m  RA Area:     24.10 cm LA Vol (A2C):   69.2 ml 32.14 ml/m RA Volume:   74.50 ml  34.60 ml/m LA Vol (A4C):   90.5 ml 42.03 ml/m LA Biplane Vol: 85.8 ml 39.85 ml/m  AORTIC VALVE AV Vmax:           99.05 cm/s AV Vmean:          69.800 cm/s AV VTI:            0.201 m AV Peak Grad:      3.9 mmHg AV Mean Grad:      2.0 mmHg LVOT Vmax:         108.00 cm/s LVOT Vmean:        67.800 cm/s LVOT VTI:          0.206 m LVOT/AV VTI ratio: 1.02  AORTA Ao Root diam: 3.40 cm MITRAL VALVE               TRICUSPID VALVE MV Area (PHT): 3.50 cm    TR Peak grad:   28.9 mmHg MV Decel Time: 217 msec    TR Vmax:        269.00 cm/s MV E velocity: 79.30 cm/s MV A velocity: 79.70 cm/s  SHUNTS MV E/A ratio:  0.99        Systemic VTI: 0.21 m Yolonda Kida MD Electronically signed by Yolonda Kida MD Signature Date/Time: 04/24/2020/11:35:00 AM    Final      Labs:   Basic Metabolic Panel: Recent Labs  Lab 04/22/20 1526 04/22/20 1526 04/23/20 0430 04/23/20 0430 04/24/20 0457 04/25/20 0416  NA 129*  --  131*  --  128* 133*  K 3.0*   < > 3.3*   < > 3.6 3.9  CL 90*  --  97*  --  95* 100  CO2 27  --  26  --  28 23  GLUCOSE 101*  --  94  --  102* 89  BUN 14  --  14  --  11 14  CREATININE 0.65  --  0.76  --  0.66 0.65  CALCIUM 9.4  --  8.5*  --  8.6* 8.5*  MG <0.1*  --  2.3  --  1.8 1.8  PHOS  --   --  3.1  --   --   --    < > = values in this interval not displayed.   GFR Estimated Creatinine Clearance: 78.1 mL/min (by C-G formula based on SCr of 0.65 mg/dL). Liver Function Tests: Recent Labs  Lab 04/22/20 1526  AST 18  ALT 15  ALKPHOS 68  BILITOT 1.0  PROT 7.5  ALBUMIN 4.0   No results for input(s): LIPASE, AMYLASE in the last 168 hours. No results for input(s): AMMONIA in the last 168 hours. Coagulation profile No  results for input(s): INR, PROTIME in the last 168 hours.  CBC: Recent Labs  Lab 04/22/20 1526 04/23/20 0430 04/24/20 0457  WBC 9.7 6.3 7.0  NEUTROABS 8.1*  --  5.1  HGB 11.0* 8.9* 9.7*  HCT 30.9* 25.5* 27.8*  MCV 104.7* 107.6* 109.0*  PLT 205 169 209   Cardiac Enzymes: No results for input(s): CKTOTAL, CKMB, CKMBINDEX, TROPONINI in the last 168 hours. BNP: Invalid input(s): POCBNP CBG: No results for input(s): GLUCAP in the last 168 hours. D-Dimer No results for input(s): DDIMER in the last  72 hours. Hgb A1c No results for input(s): HGBA1C in the last 72 hours. Lipid Profile No results for input(s): CHOL, HDL, LDLCALC, TRIG, CHOLHDL, LDLDIRECT in the last 72 hours. Thyroid function studies No results for input(s): TSH, T4TOTAL, T3FREE, THYROIDAB in the last 72 hours.  Invalid input(s): FREET3 Anemia work up Recent Labs    04/22/20 1524 04/22/20 2055 04/23/20 0430  VITAMINB12  --  6,201*  --   FOLATE 45.0  --   --   FERRITIN  --   --  118  TIBC 291  --   --   IRON 40*  --   --    Microbiology Recent Results (from the past 240 hour(s))  Respiratory Panel by RT PCR (Flu A&B, Covid) - Nasopharyngeal Swab     Status: None   Collection Time: 04/22/20  4:22 PM   Specimen: Nasopharyngeal Swab  Result Value Ref Range Status   SARS Coronavirus 2 by RT PCR NEGATIVE NEGATIVE Final    Comment: (NOTE) SARS-CoV-2 target nucleic acids are NOT DETECTED.  The SARS-CoV-2 RNA is generally detectable in upper respiratoy specimens during the acute phase of infection. The lowest concentration of SARS-CoV-2 viral copies this assay can detect is 131 copies/mL. A negative result does not preclude SARS-Cov-2 infection and should not be used as the sole basis for treatment or other patient management decisions. A negative result may occur with  improper specimen collection/handling, submission of specimen other than nasopharyngeal swab, presence of viral mutation(s) within the areas  targeted by this assay, and inadequate number of viral copies (<131 copies/mL). A negative result must be combined with clinical observations, patient history, and epidemiological information. The expected result is Negative.  Fact Sheet for Patients:  PinkCheek.be  Fact Sheet for Healthcare Providers:  GravelBags.it  This test is no t yet approved or cleared by the [place] FDA and  has been authorized for detection and/or diagnosis of SARS-CoV-2 by FDA under an Emergency Use Authorization (EUA). This EUA will remain  in effect (meaning this test can be used) for the duration of the COVID-19 declaration under Section 564(b)(1) of the Act, 21 U.S.C. section 360bbb-3(b)(1), unless the authorization is terminated or revoked sooner.     Influenza A by PCR NEGATIVE NEGATIVE Final   Influenza B by PCR NEGATIVE NEGATIVE Final    Comment: (NOTE) The Xpert Xpress SARS-CoV-2/FLU/RSV assay is intended as an aid in  the diagnosis of influenza from Nasopharyngeal swab specimens and  should not be used as a sole basis for treatment. Nasal washings and  aspirates are unacceptable for Xpert Xpress SARS-CoV-2/FLU/RSV  testing.  Fact Sheet for Patients: PinkCheek.be  Fact Sheet for Healthcare Providers: GravelBags.it  This test is not yet approved or cleared by the [place] FDA and  has been authorized for detection and/or diagnosis of SARS-CoV-2 by  FDA under an Emergency Use Authorization (EUA). This EUA will remain  in effect (meaning this test can be used) for the duration of the  Covid-19 declaration under Section 564(b)(1) of the Act, 21  U.S.C. section 360bbb-3(b)(1), unless the authorization is  terminated or revoked. Performed at [facility], [address] [place], [place] [address]   Blood culture (routine x 2)     Status: None (Preliminary  result)   Collection Time: 04/22/20  4:42 PM   Specimen: BLOOD  Result Value Ref Range Status   Specimen Description BLOOD BLOOD LEFT HAND  Final   Special Requests   Final  BOTTLES DRAWN AEROBIC AND ANAEROBIC Blood Culture adequate volume   Culture   Final    NO GROWTH 3 DAYS Performed at [facility], [address] [place], [place] [address]    Report Status PENDING  Incomplete  Blood culture (routine x 2)     Status: None (Preliminary result)   Collection Time: 04/22/20  4:42 PM   Specimen: BLOOD  Result Value Ref Range Status   Specimen Description BLOOD LEFT ANTECUBITAL  Final   Special Requests   Final    BOTTLES DRAWN AEROBIC AND ANAEROBIC Blood Culture adequate volume   Culture   Final    NO GROWTH 3 DAYS Performed at [facility], [address] [place], [place] [address]    Report Status PENDING  Incomplete  MRSA PCR Screening     Status: Abnormal   Collection Time: 04/23/20  3:10 PM   Specimen: Nasopharyngeal  Result Value Ref Range Status   MRSA by PCR POSITIVE (A) NEGATIVE Final    Comment:        The GeneXpert MRSA Assay (FDA approved for NASAL specimens only), is one component of a comprehensive MRSA colonization surveillance program. It is not intended to diagnose MRSA infection nor to guide or monitor treatment for MRSA infections. RESULT CALLED TO, READ BACK BY AND VERIFIED WITH: Josefine Class 04/23/20 1639 KLW Performed at [facility], [address] [place], [place] [address]      Discharge Instructions:   Discharge Instructions    Diet - low sodium heart healthy   Complete by: As directed    Discharge wound care:   Complete by: As directed    Change dressing to left heel wound every Monday, Wednesday and Friday.  Moist.  Structurally normal saline to assist with removal.  Apply a piece of Aquacel over wounds, then cover with 4 x 4, then foam heel cup and kerlex.   Face-to-face encounter (required for  Medicare/Medicaid patients)   Complete by: As directed    I Hiliana Eilts certify that this patient is under my care and that I, or a nurse practitioner or physician's assistant working with me, had a face-to-face encounter that meets the physician face-to-face encounter requirements with this patient on 04/25/2020. The encounter with the patient was in whole, or in part for the following medical condition(s) which is the primary reason for home health care (List medical condition): Debility,  peripheral neuropathy   The encounter with the patient was in whole, or in part, for the following medical condition, which is the primary reason for home health care: Debility, peripheral neuropathy   I certify that, based on my findings, the following services are medically necessary home health services: Physical therapy   Reason for Medically Necessary Home Health Services: Therapy- Personnel officer, Public librarian   My clinical findings support the need for the above services: Unable to leave home safely without assistance and/or assistive device   Further, I certify that my clinical findings support that this patient is homebound due to: Unable to leave home safely without assistance   Home Health   Complete by: As directed    To provide the following care/treatments: PT   Increase activity slowly   Complete by: As directed      Allergies as of 04/25/2020      Reactions   Codeine Nausea And Vomiting   Indocin [indomethacin] Other (See Comments)   Latex Other (See Comments)   Blisters  Mupirocin    Blisters   Plavix [clopidogrel Bisulfate]    GI intolerance, n/v   Polysporin [bacitracin-polymyxin B]    Blisters   Shellfish Allergy    Tape    Neosporin [neomycin-bacitracin Zn-polymyx] Rash   Other Nausea And Vomiting   mussels      Medication List    STOP taking these medications   furosemide 20 MG tablet Commonly known as: LASIX   meloxicam 15 MG tablet Commonly  known as: MOBIC     TAKE these medications   Alpha Lipoic Acid 200 MG Caps Take 2 capsules by mouth daily.   amLODipine 2.5 MG tablet Commonly known as: NORVASC TAKE 1 TABLET BY MOUTH  DAILY   aspirin 81 MG tablet Take 81 mg by mouth daily.   azelastine 0.1 % nasal spray Commonly known as: ASTELIN Place 1 spray into both nostrils 2 (two) times daily. Use in each nostril as directed   Biotin 1000 MCG tablet Take 1,000 mcg by mouth daily.   Calcium Carb-Cholecalciferol 500-100 MG-UNIT Chew Chew 500 mg by mouth daily.   clindamycin 300 MG capsule Commonly known as: CLEOCIN Take 1 capsule (300 mg total) by mouth every 8 (eight) hours for 4 days.   cyanocobalamin 1000 MCG tablet Take 1,000 mcg by mouth daily.   folic acid 1 MG tablet Commonly known as: FOLVITE Take 1 mg by mouth daily.   gabapentin 600 MG tablet Commonly known as: NEURONTIN TAKE 2 TABLETS BY MOUTH  TWICE DAILY   Glucosamine Sulfate 1000 MG Caps Take 1 capsule by mouth daily.   hydrochlorothiazide 25 MG tablet Commonly known as: HYDRODIURIL Take 1 tablet (25 mg total) by mouth 2 (two) times daily.   Lecithin 400 MG Caps Take 400 mg by mouth daily.   losartan 50 MG tablet Commonly known as: COZAAR TAKE 1 TABLET BY MOUTH  DAILY   Melatonin 3 MG Caps Take 3 mg by mouth at bedtime.   Omega 3 1000 MG Caps Take 1,000 mg by mouth daily.   omeprazole 20 MG capsule Commonly known as: PRILOSEC TAKE 1 CAPSULE BY MOUTH  TWICE DAILY BEFORE MEALS What changed: See the new instructions.   potassium chloride SA 20 MEQ tablet Commonly known as: KLOR-CON Take 1 tablet (20 mEq total) by mouth 2 (two) times daily.   pravastatin 80 MG tablet Commonly known as: PRAVACHOL TAKE 1 TABLET BY MOUTH  DAILY   PREVAGEN PO Take by mouth.   primidone 50 MG tablet Commonly known as: MYSOLINE TAKE 1 TABLET BY MOUTH 3  TIMES DAILY   Selenium 200 MCG Caps Take 200 mcg by mouth daily.   sodium chloride 1 g  tablet Take 1 tablet (1 g total) by mouth 2 (two) times daily with a meal for 5 days.   tretinoin 0.05 % cream Commonly known as: RETIN-A Apply 1 application topically at bedtime.   VITAMIN-B COMPLEX PO Take 1 tablet by mouth daily. Reported on 10/06/2015            Discharge Care Instructions  (From admission, onward)         Start     Ordered   04/25/20 0000  Discharge wound care:       Comments: Change dressing to left heel wound every Monday, Wednesday and Friday.  Moist.  Structurally normal saline to assist with removal.  Apply a piece of Aquacel over wounds, then cover with 4 x 4, then foam heel cup and kerlex.   04/25/20  1320            Time coordinating discharge: 33 minutes  Signed:  Nuri Branca  [facility] 04/25/2020, 1:20 PM   Pager on [email or web address]. If 7PM-7AM, please contact night-coverage at [email or web address]

## 2020-04-25 NOTE — Progress Notes (Signed)
Physical Therapy Evaluation Patient Details Name: [name] MRN: [id number] DOB: February 11, 1936 Today's Date: 04/25/2020   History of Present Illness  Per MD note:[name] is a 84 y.o. male with Past medical history of HTN, CAD, HLD, CHF, neuropathy below knee and bilateral hands, moderate TR, chronic hyponatremia, osteoarthritis, left great toe amputation, bilateral foot drops uses a special shoes, chronic left heel ulcer follows at wound care clinic, prostate cancer status post surgical resection in 2004.  Patient presented at [facility] ED, sent from wound care clinic due to left knee swelling and worsening of pain.  Patient has history of MRSA positive cultures in the past, on exam it seems patient is having left knee cellulitis but does not seem to have septic arthritis.  Patient stated that left knee swelling and pain is getting worse for the past few days.  Patient was febrile and having tachycardia in the ED.  Patient denies any shortness of breath, no chest pain, no palpitations.  Patient denies any abdominal pain, no nausea vomiting or diarrhea.  Clinical Impression  Patient agrees to PT evaluation. He has -3/5 BLE hip flex and knee extension strength. He has good sitting balance and poor standing balance with UE support with RW.  He needs mod assist with bed mobility supine <> sit min assist for transfers sit to stand with RW with BLE support on the back of bed for stability. He is unable to ambulate due to poor static standing balance.  Patient will continue to benefit from skilled PT to improve mobility and strength.    Follow Up Recommendations Home health PT;Supervision - Intermittent;Supervision for mobility/OOB    Equipment Recommendations  None recommended by PT    Recommendations for Other Services       Precautions / Restrictions Precautions Precautions: Fall Restrictions Weight Bearing Restrictions: No      Mobility  Bed Mobility Overal bed mobility: Needs Assistance Bed  Mobility: Supine to Sit;Sit to Supine     Supine to sit: Min assist Sit to supine: Min assist        Transfers Overall transfer level: Needs assistance Equipment used: Rolling walker (2 wheeled) Transfers: Sit to/from Stand Sit to Stand: Min assist         General transfer comment: LE's stabilize on bed  Ambulation/Gait Ambulation/Gait assistance:  (unable)              Stairs            Wheelchair Mobility    Modified Rankin (Stroke Patients Only)       Balance Overall balance assessment: Modified Independent                                           Pertinent Vitals/Pain Pain Assessment: No/denies pain    Home Living Family/patient expects to be discharged to:: Private residence Living Arrangements: Spouse/significant other Available Help at Discharge: Family;Home health Type of Home: House Home Access: Ramped entrance       Home Equipment: Environmental consultant - 2 wheels      Prior Function Level of Independence: Needs assistance   Gait / Transfers Assistance Needed: short distance ambulation wiht RW           Hand Dominance        Extremity/Trunk Assessment   Upper Extremity Assessment Upper Extremity Assessment: Defer to OT evaluation    Lower Extremity Assessment Lower  Extremity Assessment: RLE deficits/detail;LLE deficits/detail RLE Deficits / Details: -3/5 hip flex, abd, knee ext -3/5 LLE Deficits / Details:  (-3/5 hip flex, abd, knee ext -3/5)       Communication   Communication: No difficulties  Cognition Arousal/Alertness: Awake/alert Behavior During Therapy: WFL for tasks assessed/performed Overall Cognitive Status: Within Functional Limits for tasks assessed                                        General Comments      Exercises     Assessment/Plan    PT Assessment Patient needs continued PT services  PT Problem List Decreased strength;Decreased balance;Decreased mobility        PT Treatment Interventions Gait training;Therapeutic activities;Therapeutic exercise;Balance training    PT Goals (Current goals can be found in the Care Plan section)  Acute Rehab PT Goals Patient Stated Goal: to get stronger PT Goal Formulation: With patient Time For Goal Achievement: 05/09/20 Potential to Achieve Goals: Fair    Frequency Min 2X/week   Barriers to discharge        Co-evaluation               AM-PAC PT "6 Clicks" Mobility  Outcome Measure Help needed turning from your back to your side while in a flat bed without using bedrails?: A Lot Help needed moving from lying on your back to sitting on the side of a flat bed without using bedrails?: A Lot Help needed moving to and from a bed to a chair (including a wheelchair)?: A Lot Help needed standing up from a chair using your arms (e.g., wheelchair or bedside chair)?: Total Help needed to walk in hospital room?: Total Help needed climbing 3-5 steps with a railing? : Total 6 Click Score: 9    End of Session Equipment Utilized During Treatment: Gait belt Activity Tolerance: Patient tolerated treatment well Patient left: in bed;with bed alarm set Nurse Communication: Mobility status PT Visit Diagnosis: Unsteadiness on feet (R26.81);Other abnormalities of gait and mobility (R26.89);Muscle weakness (generalized) (M62.81);Difficulty in walking, not elsewhere classified (R26.2)    Time: 1607-3710 PT Time Calculation (min) (ACUTE ONLY): 25 min   Charges:   PT Evaluation $PT Eval Low Complexity: 1 Low PT Treatments $Therapeutic Activity: 8-22 mins          Alanson Puls, PT DPT 04/25/2020, 12:49 PM

## 2020-04-25 NOTE — Progress Notes (Signed)
[name] Feeling to be D/C'd home per MD order.  Discussed prescriptions and follow up appointments with the patient. Prescriptions given to patient, medication list explained in detail. Pt verbalized understanding.  Allergies as of 04/25/2020      Reactions   Codeine Nausea And Vomiting   Indocin [indomethacin] Other (See Comments)   Latex Other (See Comments)   Blisters   Mupirocin    Blisters   Plavix [clopidogrel Bisulfate]    GI intolerance, n/v   Polysporin [bacitracin-polymyxin B]    Blisters   Shellfish Allergy    Tape    Neosporin [neomycin-bacitracin Zn-polymyx] Rash   Other Nausea And Vomiting   mussels      Medication List    STOP taking these medications   furosemide 20 MG tablet Commonly known as: LASIX   meloxicam 15 MG tablet Commonly known as: MOBIC     TAKE these medications   Alpha Lipoic Acid 200 MG Caps Take 2 capsules by mouth daily.   amLODipine 2.5 MG tablet Commonly known as: NORVASC TAKE 1 TABLET BY MOUTH  DAILY   aspirin 81 MG tablet Take 81 mg by mouth daily.   azelastine 0.1 % nasal spray Commonly known as: ASTELIN Place 1 spray into both nostrils 2 (two) times daily. Use in each nostril as directed   Biotin 1000 MCG tablet Take 1,000 mcg by mouth daily.   Calcium Carb-Cholecalciferol 500-100 MG-UNIT Chew Chew 500 mg by mouth daily.   clindamycin 300 MG capsule Commonly known as: CLEOCIN Take 1 capsule (300 mg total) by mouth every 8 (eight) hours for 4 days.   cyanocobalamin 1000 MCG tablet Take 1,000 mcg by mouth daily.   folic acid 1 MG tablet Commonly known as: FOLVITE Take 1 mg by mouth daily.   gabapentin 600 MG tablet Commonly known as: NEURONTIN TAKE 2 TABLETS BY MOUTH  TWICE DAILY   Glucosamine Sulfate 1000 MG Caps Take 1 capsule by mouth daily.   hydrochlorothiazide 25 MG tablet Commonly known as: HYDRODIURIL Take 1 tablet (25 mg total) by mouth 2 (two) times daily.   Lecithin 400 MG Caps Take 400 mg by mouth  daily.   losartan 50 MG tablet Commonly known as: COZAAR TAKE 1 TABLET BY MOUTH  DAILY   Melatonin 3 MG Caps Take 3 mg by mouth at bedtime.   Omega 3 1000 MG Caps Take 1,000 mg by mouth daily.   omeprazole 20 MG capsule Commonly known as: PRILOSEC TAKE 1 CAPSULE BY MOUTH  TWICE DAILY BEFORE MEALS What changed: See the new instructions.   potassium chloride SA 20 MEQ tablet Commonly known as: KLOR-CON Take 1 tablet (20 mEq total) by mouth 2 (two) times daily.   pravastatin 80 MG tablet Commonly known as: PRAVACHOL TAKE 1 TABLET BY MOUTH  DAILY   PREVAGEN PO Take by mouth.   primidone 50 MG tablet Commonly known as: MYSOLINE TAKE 1 TABLET BY MOUTH 3  TIMES DAILY   Selenium 200 MCG Caps Take 200 mcg by mouth daily.   sodium chloride 1 g tablet Take 1 tablet (1 g total) by mouth 2 (two) times daily with a meal for 5 days.   tretinoin 0.05 % cream Commonly known as: RETIN-A Apply 1 application topically at bedtime.   VITAMIN-B COMPLEX PO Take 1 tablet by mouth daily. Reported on 10/06/2015            Discharge Care Instructions  (From admission, onward)  Start     Ordered   04/25/20 0000  Discharge wound care:       Comments: Change dressing to left heel wound every Monday, Wednesday and Friday.  Moist.  Structurally normal saline to assist with removal.  Apply a piece of Aquacel over wounds, then cover with 4 x 4, then foam heel cup and kerlex.   04/25/20 1320          Vitals:   04/25/20 0804 04/25/20 1202  BP: 128/84 122/69  Pulse: 70 70  Resp:    Temp: 98 F (36.7 C) 98.7 F (37.1 C)  SpO2: 98% 99%    Skin clean, dry and intact without evidence of skin break down, no evidence of skin tears noted. IV catheter discontinued intact. Site without signs and symptoms of complications. Dressing and pressure applied. Pt denies pain at this time. No complaints noted.  An After Visit Summary was printed and given to the patient. Patient escorted  via [place], and D/C home via private auto.  Fuller Mandril, RN

## 2020-04-26 NOTE — TOC Initial Note (Signed)
Transition of Care [facility]) - Initial/Assessment Note    Patient Details  Name: [name] MRN: [id number] Date of Birth: 09-09-1935  [facility] CM/SW Contact:    Truitt Merle, LCSW Phone Number: 04/26/2020, 3:32 PM  Clinical Narrative:               Patient admitted from home with left knee swelling associated with pain and redness. Patient lives at home with spouse.  Patient states he was receiving P/T with [facility] and was also being seen at [facility].   TOC consult for Home Health PT. Patient agreeable to home health and has no preference of agency. Patient agreeable to referral to [facility]. Confirmed with Floydene Flock with [facility] that they can accept patient. Patient is active with his PCP Dr [name] at [facility] in [place] issues obtaining medications. Patient denies issues with transportation. No further TOC needs. Consult completed.   Expected Discharge Plan: [place] Barriers to Discharge: No Barriers Identified   Patient Goals and CMS Choice     Choice offered to / list presented to : Patient  Expected Discharge Plan and Services Expected Discharge Plan: [place] In-house Referral: Clinical Social Work Discharge Planning Services: CM Consult Post Acute Care Choice: [place] arrangements for the past 2 months: [place] Expected Discharge Date: 04/25/20               DME Arranged: N/A DME Agency: NA       HH Arranged: PT, RN [place] Agency: [place] ([place]) Date [place]: 04/26/20 Time [place]: 17 Representative spoke with at [place]: Floydene Flock  Prior Living Arrangements/Services Living arrangements for the past 2 months: Single Family Home Lives with:: Spouse Patient language and need for interpreter reviewed:: Yes Do you feel safe going back to the place where you live?: Yes       Need for Family Participation in Patient Care: No (Comment) Care giver support system in place?: Yes (comment)   Criminal Activity/Legal Involvement Pertinent to Current Situation/Hospitalization: No - Comment as needed  Activities of Daily Living Home Assistive Devices/Equipment: Walker (specify type), Hearing aid (foot brace) ADL Screening (condition at time of admission) Patient's cognitive ability adequate to safely complete daily activities?: Yes Is the patient deaf or have difficulty hearing?: Yes Does the patient have difficulty seeing, even when wearing glasses/contacts?: Yes Does the patient have difficulty concentrating, remembering, or making decisions?: No Patient able to express need for assistance with ADLs?: Yes Does the patient have difficulty dressing or bathing?: Yes Independently performs ADLs?: Yes (appropriate for developmental age) Does the patient have difficulty walking or climbing stairs?: Yes Weakness of Legs: Both Weakness of Arms/Hands: Both  Permission Sought/Granted Permission sought to share information with : Facility Art therapist granted to share information with : Yes, Verbal Permission Granted     Permission granted to share info w AGENCY: [facility]        Emotional Assessment Appearance:: Other (Comment Required (Contact precautions. Assessment via phone.) Attitude/Demeanor/Rapport: Engaged Affect (typically observed): Accepting Orientation: : Oriented to Self, Oriented to Place, Oriented to  Time, Oriented to Situation Alcohol / Substance Use: Never Used Psych Involvement: No (comment)  Admission diagnosis:  Cellulitis of left leg [L03.116] Cellulitis of knee, left [L03.116] Patient Active Problem List   Diagnosis Date Noted  . Sepsis ([place]) 04/24/2020  . Cellulitis of  knee, left 04/22/2020  . Hypokalemia 04/22/2020  . Osteoarthritis of multiple joints 12/15/2017  . Hyponatremia 03/28/2017  . Abnormality of  gait and mobility 01/11/2017  . Cloudy vision 01/11/2017  . Foot drop, bilateral 01/11/2017  . Amputee, great toe, left ([place]) 01/11/2017  . 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 Macrocytic anemia 09/22/2015  . Moderate tricuspid insufficiency 04/21/2015  . History of prostate cancer 04/16/2015  . Idiopathic peripheral neuropathy 04/16/2015  . Essential hypertension 04/16/2015  . GERD (gastroesophageal reflux disease) 04/16/2015  . Arthritis 04/16/2015  . Pedal edema 04/16/2015  . Benign essential tremor 10/30/2014  . Hyperlipidemia, mixed 10/27/2014  . Chronic systolic CHF (congestive heart failure), NYHA class 2 ([place]) 04/23/2014  . Mitral insufficiency 11/05/2013  . CAD (coronary artery disease) 11/01/2013  . Basal cell carcinoma of skin of other parts of face 03/14/2013  . Ankle sprain 09/09/2011  . History of fibula fracture 09/09/2011   PCP:  [name], DO Pharmacy:   [id number] - [place], [place] [address] Phone: [phone] Fax: [phone]     Social Determinants of Health (SDOH) Interventions    Readmission Risk Interventions No flowsheet data found.

## 2020-04-27 ENCOUNTER — Ambulatory Visit: Payer: Medicare Other | Admitting: Family Medicine

## 2020-04-27 ENCOUNTER — Telehealth: Payer: Self-pay

## 2020-04-27 DIAGNOSIS — L03116 Cellulitis of left lower limb: Secondary | ICD-10-CM | POA: Diagnosis not present

## 2020-04-27 LAB — CULTURE, BLOOD (ROUTINE X 2)
Culture: NO GROWTH
Culture: NO GROWTH
Special Requests: ADEQUATE
Special Requests: ADEQUATE

## 2020-04-27 NOTE — Telephone Encounter (Signed)
Transition Care Management Unsuccessful Follow-up Telephone Call  Date of discharge and from where:  04/25/2020 [facility]  Attempts:  1st Attempt  Reason for unsuccessful TCM follow-up call:  Left voice message

## 2020-04-28 ENCOUNTER — Other Ambulatory Visit: Payer: Self-pay

## 2020-04-28 ENCOUNTER — Encounter: Payer: Medicare Other | Admitting: Physician Assistant

## 2020-04-28 ENCOUNTER — Telehealth: Payer: Self-pay

## 2020-04-28 DIAGNOSIS — L97512 Non-pressure chronic ulcer of other part of right foot with fat layer exposed: Secondary | ICD-10-CM | POA: Diagnosis not present

## 2020-04-28 DIAGNOSIS — L97422 Non-pressure chronic ulcer of left heel and midfoot with fat layer exposed: Secondary | ICD-10-CM | POA: Diagnosis not present

## 2020-04-28 DIAGNOSIS — G603 Idiopathic progressive neuropathy: Secondary | ICD-10-CM | POA: Diagnosis not present

## 2020-04-28 DIAGNOSIS — Z888 Allergy status to other drugs, medicaments and biological substances status: Secondary | ICD-10-CM | POA: Diagnosis not present

## 2020-04-28 DIAGNOSIS — Z91040 Latex allergy status: Secondary | ICD-10-CM | POA: Diagnosis not present

## 2020-04-28 DIAGNOSIS — Z885 Allergy status to narcotic agent status: Secondary | ICD-10-CM | POA: Diagnosis not present

## 2020-04-28 DIAGNOSIS — M21371 Foot drop, right foot: Secondary | ICD-10-CM | POA: Diagnosis not present

## 2020-04-28 DIAGNOSIS — M21372 Foot drop, left foot: Secondary | ICD-10-CM | POA: Diagnosis not present

## 2020-04-28 DIAGNOSIS — L89623 Pressure ulcer of left heel, stage 3: Secondary | ICD-10-CM | POA: Diagnosis not present

## 2020-04-28 DIAGNOSIS — L97522 Non-pressure chronic ulcer of other part of left foot with fat layer exposed: Secondary | ICD-10-CM | POA: Diagnosis not present

## 2020-04-28 NOTE — Telephone Encounter (Signed)
Transition Care Management Unsuccessful Follow-up Telephone Call  Date of discharge and from where:  04/25/2020  Attempts:  2nd Attempt  Reason for unsuccessful TCM follow-up call:  Left voice message   Appointment with Dr. Raliegh Ip on 04/29/2020 at 9:00a.

## 2020-04-28 NOTE — Progress Notes (Signed)
[name], [name] ([id number]) Visit Report for 04/28/2020 Chief Complaint Document Details Patient Name: Marcus, Holt Date of Service: 04/28/2020 12:30 PM Medical Record Number: [id number] Patient Account Number: [id number] Date of Birth/Sex: December 07, 1935 (84 y.o. M) Treating RN: Cornell Barman Primary Care Provider: [name] Other Clinician: Referring Provider: [name] Treating Provider/Extender: Skipper Cliche in Treatment: 11 Information Obtained from: Patient Chief Complaint Pressure ulcer left heel Electronic Signature(s) Signed: 04/28/2020 12:57:29 PM By: Worthy Keeler PA-C Entered By: Worthy Keeler on 04/28/2020 12:57:29 Quinones, [name] ([id number]) -------------------------------------------------------------------------------- HPI Details Patient Name: [name] Date of Service: 04/28/2020 12:30 PM Medical Record Number: [id number] Patient Account Number: [id number] Date of Birth/Sex: [birth year] (84 y.o. M) Treating RN: Cornell Barman Primary Care Provider: [name] Other Clinician: Referring Provider: [name] Treating Provider/Extender: Skipper Cliche in Treatment: 11 History of Present Illness HPI Description: 02/11/2020 on evaluation today patient appears to be doing somewhat poorly on initial evaluation concerning his heel. He has been using a topical antibiotic ointment which does not seem to really have been doing the best job for him. He tells me that he had a wound that occurred as a result of his padding in the bottom of his shoe wear his foot drop brace goes actually had worn through and his foot was actually rubbing on the bottom of the shoe on the brace itself. Subsequently this ended up with a wound and he in turn had a lot of issues following. Fortunately there is no signs of active infection at this time which is good news in my opinion. With that being said the wound does appear to be somewhat moist with some evidence  of maceration unfortunately. He has no pain he does have neuropathy. He also has bilateral foot drop which she wears braces. This does appear to be however a pressure injury secondary to the AFO brace. The patient has been seen by podiatry since May 2021 unfortunately this is not healing hence the reason that he is coming to see [place] at this point to see if there is anything we can do to help in this regard. 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 9/15; the patient missed his appointment last week due to illness with his wife who had to be hospitalized. We are using Hydrofera Blue to the small punched out area in the middle of the left plantar heel. 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 This is felt to be pressure from the brace itself. 03/05/2020 upon evaluation today patient appears to be doing well in regard to his wound on the heel. He has been tolerating the dressing changes without complication which is great news. Fortunately there is no signs of active infection which I am very happy with. 03/19/2020 upon evaluation today patient appears to be doing a little worse in regard to his heel based on what I am seeing at this point. There actually appears to be a pocket off to the side laterally that is problematic for him at this point. I am not to clear this way with some sharp debridement currently. The patient was in agreement with that plan. 10/20; patient I do not usually see. He has a small wound on the left plantar heel. Some depth. He is apparently completing antibiotics. He has bilateral foot drop and therefore is AFO braces limiting what we can do to offload this. 04/14/2020 upon  evaluation today patient's heel actually appears to be doing fairly well. There is no signs of active infection at this time which is great news and overall I feel like he is managing nicely. The  only issue he has is that the individual who has been helping change the dressings at home unfortunately has not been available for the past week or so. She is out of town. Nonetheless he has been going to urgent care to have the dressings changed as he is not able to do this on his own and states that his wife is unable to do it for him. 11/10; patient in today for a nurse visit but I was asked to look at him. I note that we are treating him for a small punched-out area on the left plantar heel. He arrived with a new superficial area close to this wound I am not really clear of the exact etiology of this. The bigger problem was increased swelling warmth of the lower leg below the knee but intense erythema involving the entire anterior knee and medial thigh. His entire leg is warm and swollen. However it was not completely obvious to me that there was a big change in the lower leg however the nurses and the patient certainly thought there was. The patient stated that after his 2-week of trimethoprim sulfamethoxazole his legs looked very similar. His vital signs are stable he is afebrile but nevertheless if this is all infection this is extensive and failing outpatient management 04/28/2020 on evaluation today patient presents for follow-up after having been seen in the ER and admitted to the hospital due to cellulitis in the left knee. He has continued to have issues at this point with regard to his knee it still very hot to touch upon evaluation today. His heel ulcer is actually measuring smaller than the last time I saw him but he still is having a lot of difficulty with dry and flaky skin over his feet in general. Electronic Signature(s) Signed: 04/28/2020 2:10:07 PM By: Worthy Keeler PA-C Entered By: Worthy Keeler on 04/28/2020 14:10:07 [name] ([id number]) -------------------------------------------------------------------------------- Physical Exam Details Patient Name: [name] Date  of Service: 04/28/2020 12:30 PM Medical Record Number: [id number] Patient Account Number: [id number] Date of Birth/Sex: Oct 29, 1935 (84 y.o. M) Treating RN: Cornell Barman Primary Care Provider: [name] Other Clinician: Referring Provider: [name] Treating Provider/Extender: Skipper Cliche in Treatment: 1 Constitutional Well-nourished and well-hydrated in no acute distress. Respiratory normal breathing without difficulty. Psychiatric this patient is able to make decisions and demonstrates good insight into disease process. Alert and Oriented x 3. pleasant and cooperative. Notes On inspection patient's wound bed actually showed signs of good granulation at this time. There does not appear to be any evidence of active infection which is great news. No fevers, chills, nausea, vomiting, or diarrhea. Electronic Signature(s) Signed: 04/28/2020 2:10:22 PM By: Worthy Keeler PA-C Entered By: Worthy Keeler on 04/28/2020 14:10:21 [name] ([id number]) -------------------------------------------------------------------------------- Physician Orders Details Patient Name: [name] Date of Service: 04/28/2020 12:30 PM Medical Record Number: [id number] Patient Account Number: [id number] Date of Birth/Sex: January 21, 1936 (84 y.o. 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 Follow-up Appointments Wound #1 Left,Plantar Calcaneus o Return Appointment in 1 week. Wound #2 Left,Proximal,Plantar Foot o Return Appointment in 1 week. Off-Loading Wound #1 Left,Plantar Calcaneus o Other: - keep pressure off of heel Wound #2 Left,Proximal,Plantar Foot o Other: - keep pressure off of heel Patient Medications Allergies: codeine, mussels, latex, Indocin Notifications Medication Indication Start End linezolid 04/27/2020 DOSE 1 - oral 600 mg tablet - 1 tablet oral taken 2 times per day for 14 days Carrero, Davanta ([id number]) Electronic Signature(s) Signed: 04/28/2020 1:32:33 PM By: Worthy Keeler PA-C Entered By: Worthy Keeler on 04/28/2020 13:32:32 Campanile, [name] ([id number]) -------------------------------------------------------------------------------- Problem List Details Patient Name: [name] Date of Service: 04/28/2020 12:30 PM Medical Record Number: [id number] Patient Account Number: [id number] Date of Birth/Sex: 08-Jun-1936 (84 y.o. 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 02/11/2020 No Yes M21.372 Foot drop, left foot 02/11/2020 No Yes M21.371 Foot drop, right foot 02/11/2020 No Yes L03.116 Cellulitis of left lower limb 04/22/2020 No Yes Inactive Problems Resolved Problems Electronic  Signature(s) Signed: 04/28/2020 12:57:23 PM By: Worthy Keeler PA-C Entered By: Worthy Keeler on 04/28/2020 12:57:22 Luber, [name] ([id number]) -------------------------------------------------------------------------------- Progress Note Details Patient Name: [name] Date of Service: 04/28/2020 12:30 PM Medical Record Number: [id number] Patient Account Number: [id number] Date of Birth/Sex: [birth year] (84 y.o. M) Treating RN: Cornell Barman Primary Care Provider: [name] Other Clinician: Referring Provider: [name] Treating Provider/Extender: Skipper Cliche in Treatment: 11 Subjective Chief Complaint Information obtained from Patient Pressure ulcer left heel History of Present Illness (HPI) 02/11/2020 on evaluation today patient appears to be doing somewhat poorly on initial evaluation concerning his heel. He has been using a topical antibiotic ointment which does not seem to really have been doing the best job for him. He tells me that he had a wound that occurred as a result of his padding in the bottom of his shoe wear his foot drop brace goes actually had worn through and his foot was actually rubbing on the bottom of the shoe on the brace itself. Subsequently this ended up with a wound and he in turn had a lot of issues following. Fortunately there is no signs of active infection at this time which is good news in my opinion. With that being said the wound does appear to be somewhat moist with some evidence of maceration unfortunately. He has no pain he does have neuropathy. He also has bilateral foot drop which she wears braces. This does appear to be however a pressure injury secondary to the AFO brace. The patient has been seen by podiatry since May 2021 unfortunately this is not healing hence the reason that he is coming to see [place] at this point to see if there is anything we can do to help in this regard. 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 9/15; the patient missed his appointment last week due to illness with his wife who had to be hospitalized. We are using Hydrofera Blue to the small punched out area in the middle of the left plantar heel. 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 This is felt to be pressure from the brace itself. 03/05/2020 upon evaluation today patient appears to be doing well in regard to his wound on the heel. He has been tolerating the dressing changes without complication which is great news. Fortunately there is no signs of active infection which I am very happy with. 03/19/2020 upon evaluation today patient appears to be doing a little worse in regard to his heel based on what I am seeing at this point. There actually appears to be a pocket off to the side laterally that is problematic for him at this point. I am not to clear this way with some sharp debridement currently. The patient was in agreement with that plan. 10/20; patient I do not usually see. He has a small wound on the left plantar heel. Some depth. He is apparently completing antibiotics. He has bilateral foot drop and therefore is AFO braces limiting what we can do to offload this. 04/14/2020 upon evaluation today patient's heel actually appears to be doing fairly well. There is no signs of active infection at this time which is great news and overall I feel like he is managing nicely. The only issue he has is that the individual who has been helping change the dressings at home unfortunately has not been available for the past week or so. She is out of town. Nonetheless he has been going to urgent care to have the dressings changed as he is not able to do this on his own and states that his wife is unable to do it for him. 11/10; patient in today for a nurse visit but I was asked to look at him. I note that we are treating him for a  small punched-out area on the left plantar heel. He arrived with a new superficial area close to this wound I am not really clear of the exact etiology of this. The bigger problem was increased swelling warmth of the lower leg below the knee but intense erythema involving the entire anterior knee and medial thigh. His entire leg is warm and swollen. However it was not completely obvious to me that there was a big change in the lower leg however the nurses and the patient certainly thought there was. The patient stated that after his 2-week of trimethoprim sulfamethoxazole his legs looked very similar. His vital signs are stable he is afebrile but nevertheless if this is all infection this is extensive and failing outpatient management 04/28/2020 on evaluation today patient presents for follow-up after having been seen in the ER and admitted to the hospital due to cellulitis in the left knee. He has continued to have issues at this point with regard to his knee it still very hot to touch upon evaluation today. His heel ulcer is actually measuring smaller than the last time I saw him but he still is having a lot of difficulty with dry and flaky skin over his feet in general. Objective Constitutional Well-nourished and well-hydrated in no acute distress. Vitals Time Taken: 12:41 PM, Height: 72 in, Weight: 200 lbs, BMI: 27.1, Temperature: 98.1 F, Pulse: 98 bpm, Respiratory Rate:  18 breaths/min, Blood Pressure: 130/80 mmHg. Respiratory normal breathing without difficulty. WYMON, SWANEY ([id number]) Psychiatric this patient is able to make decisions and demonstrates good insight into disease process. Alert and Oriented x 3. pleasant and cooperative. General Notes: On inspection patient's wound bed actually showed signs of good granulation at this time. There does not appear to be any evidence of active infection which is great news. No fevers, chills, nausea, vomiting, or diarrhea. Integumentary (Hair,  Skin) Wound #1 status is Open. Original cause of wound was Trauma. The wound is located on the Left,Plantar Calcaneus. The wound measures 0.4cm length x 0.4cm width x 0.2cm depth; 0.126cm^2 area and 0.025cm^3 volume. There is Fat Layer (Subcutaneous Tissue) exposed. There is no tunneling or undermining noted. There is a medium amount of serosanguineous drainage noted. The wound margin is flat and intact. There is large (67-100%) pink, pale granulation within the wound bed. There is a small (1-33%) amount of necrotic tissue within the wound bed including Adherent Slough. Wound #2 status is Open. Original cause of wound was Not Known. The wound is located on the Left,Proximal,Plantar Foot. The wound measures 0.2cm length x 2.6cm width x 0.1cm depth; 0.408cm^2 area and 0.041cm^3 volume. There is Fat Layer (Subcutaneous Tissue) exposed. There is no tunneling or undermining noted. There is a small amount of drainage noted. There is small (1-33%) red, pink granulation within the wound bed. There is no necrotic tissue within the wound bed. Assessment Active Problems ICD-10 Pressure ulcer of left heel, stage 3 Idiopathic progressive neuropathy Foot drop, left foot Foot drop, right foot Cellulitis of left lower limb Plan Wound Cleansing: Wound #1 Left,Plantar Calcaneus: Dial antibacterial soap, wash wounds, rinse and pat dry prior to dressing wounds Skin Barriers/Peri-Wound Care: Wound #1 Left,Plantar Calcaneus: Moisturizing lotion - Twice daily Primary Wound Dressing: Wound #1 Left,Plantar Calcaneus: Silver Alginate Secondary Dressing: Wound #1 Left,Plantar Calcaneus: Conform/Kerlix - Heel cup secured by stretch net Dressing Change Frequency: Wound #1 Left,Plantar Calcaneus: Change dressing every other day. Follow-up Appointments: Wound #1 Left,Plantar Calcaneus: Return Appointment in 1 week. Wound #2 Left,Proximal,Plantar Foot: Return Appointment in 1 week. Off-Loading: Wound #1  Left,Plantar Calcaneus: Other: - keep pressure off of heel Wound #2 Left,Proximal,Plantar Foot: Other: - keep pressure off of heel The following medication(s) was prescribed: linezolid oral 600 mg tablet 1 1 tablet oral taken 2 times per day for 14 days starting 04/27/2020 1. Would recommend currently that we going continue with the wound care measures as before and the patient is in agreement with that plan. This includes the use of silver alginate for the heel. 2. I am also can recommend at this time that we have the patient continue to take an antibiotic. I really think that the clindamycin may not be doing the best form he was on vancomycin and cefepime when in the hospital. He did test positive for a nasal swab PCR culture for MRSA. For that reason I really feel like MRSA is a likely causative agent for this knee. I would recommend we place him on linezolid which I think would be an appropriate way to go and I would go ahead and send that into the pharmacy for him today. He is in agreement with that plan. Vento, [name] ([id number]) We will see patient back for reevaluation in 1 week here in the clinic. If anything worsens or changes patient will contact our office for additional recommendations. Electronic Signature(s) Signed: 04/28/2020 2:11:18 PM By: Worthy Keeler PA-C Entered By: Worthy Keeler  on 04/28/2020 14:11:18 CHRISTIAAN, STREBECK ([id number]) -------------------------------------------------------------------------------- SuperBill Details Patient Name: [name] Date of Service: 04/28/2020 Medical Record Number: [id number] Patient Account Number: [id number] Date of Birth/Sex: February 28, 1936 (84 y.o. 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 Foot drop, left foot M21.371 Foot drop, right foot L03.116 Cellulitis of left lower limb Facility Procedures CPT4 Code: [id number] Description: 99213 - WOUND CARE VISIT-LEV 3 EST PT Modifier: Quantity: 1 Physician Procedures CPT4 Code: 7121975 Description: 88325 - WC PHYS LEVEL 4 - EST PT Modifier: Quantity: 1 CPT4 Code: Description: ICD-10 Diagnosis Description L89.623 Pressure ulcer of left heel, stage 3 G60.3 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

## 2020-04-29 ENCOUNTER — Ambulatory Visit (INDEPENDENT_AMBULATORY_CARE_PROVIDER_SITE_OTHER): Payer: Medicare Other | Admitting: Family Medicine

## 2020-04-29 ENCOUNTER — Encounter: Payer: Self-pay | Admitting: Family Medicine

## 2020-04-29 ENCOUNTER — Telehealth: Payer: Self-pay

## 2020-04-29 VITALS — BP 137/62 | HR 103 | Temp 97.5°F | Resp 16 | Ht 72.0 in | Wt 210.0 lb

## 2020-04-29 DIAGNOSIS — L97429 Non-pressure chronic ulcer of left heel and midfoot with unspecified severity: Secondary | ICD-10-CM | POA: Diagnosis not present

## 2020-04-29 DIAGNOSIS — L03116 Cellulitis of left lower limb: Secondary | ICD-10-CM | POA: Diagnosis not present

## 2020-04-29 DIAGNOSIS — M21372 Foot drop, left foot: Secondary | ICD-10-CM | POA: Diagnosis not present

## 2020-04-29 DIAGNOSIS — R262 Difficulty in walking, not elsewhere classified: Secondary | ICD-10-CM | POA: Diagnosis not present

## 2020-04-29 DIAGNOSIS — Z9181 History of falling: Secondary | ICD-10-CM | POA: Diagnosis not present

## 2020-04-29 DIAGNOSIS — M21371 Foot drop, right foot: Secondary | ICD-10-CM | POA: Diagnosis not present

## 2020-04-29 DIAGNOSIS — R260 Ataxic gait: Secondary | ICD-10-CM | POA: Diagnosis not present

## 2020-04-29 DIAGNOSIS — R2681 Unsteadiness on feet: Secondary | ICD-10-CM | POA: Diagnosis not present

## 2020-04-29 NOTE — Telephone Encounter (Signed)
[name] was asking about the name of Different oncologist ?

## 2020-04-29 NOTE — Progress Notes (Signed)
Subjective:    Patient ID: [name], male    DOB: Jul 13, 1935, 84 y.o.   MRN: [id number]  [name] is a 84 y.o. male presenting on 04/29/2020 for Cellulitis   HPI  HOSPITAL FOLLOW-UP VISIT  Hospital/Location: [facility] Date of Admission: 04/22/20 Date of Discharge: 04/25/20 Transitions of care telephone call: attempted x 2 by Lawana Pai LPN on 67/61/95  Reason for Admission: Left lower extremity Cellulitis, Sepsis - Hospital H&P and Discharge Summary have been reviewed - Patient presents today 4 days after recent hospitalization. Brief summary of recent course, patient had symptoms of Left leg cellulitis 2 week prior, had pseudmonas and MRSA culture prior, treated with Levaquin and Bactrim, hospitalized, treated with IV antibiotics, fluid and other supportive care, treated with sodium tablets as well for chronic hyponatremia, discharged with wound care on Clindamycin for 4 day course every 8 hours.  - Today reports overall has done well after discharge. Symptoms of overall illness have resolved except still has redness swelling pain left knee. Has seen wound care yesterday in follow-up they changed antibiotic to something stronger. They are unsure which one, will pick up today from pharmacy - Note original onset site from Left heel ulcer, being treated by [facility]   I have reviewed the discharge medication list, and have reconciled the current and discharge medications today.   Current Outpatient Medications:  .  Alpha Lipoic Acid 200 MG CAPS, Take 2 capsules by mouth daily., Disp: , Rfl:  .  amLODipine (NORVASC) 2.5 MG tablet, TAKE 1 TABLET BY MOUTH  DAILY (Patient taking differently: Take 2.5 mg by mouth daily. ), Disp: 90 tablet, Rfl: 3 .  Apoaequorin (PREVAGEN PO), Take by mouth., Disp: , Rfl:  .  aspirin 81 MG tablet, Take 81 mg by mouth daily. , Disp: , Rfl:  .  azelastine (ASTELIN) 0.1 % nasal spray, Place 1 spray into both nostrils 2 (two) times daily. Use in each  nostril as directed, Disp: 30 mL, Rfl: 5 .  B Complex Vitamins (VITAMIN-B COMPLEX PO), Take 1 tablet by mouth daily. Reported on 10/06/2015, Disp: , Rfl:  .  Biotin 1000 MCG tablet, Take 1,000 mcg by mouth daily., Disp: , Rfl:  .  Calcium Carb-Cholecalciferol 500-100 MG-UNIT CHEW, Chew 500 mg by mouth daily., Disp: , Rfl:  .  cyanocobalamin 1000 MCG tablet, Take 1,000 mcg by mouth daily., Disp: , Rfl:  .  folic acid (FOLVITE) 1 MG tablet, Take 1 mg by mouth daily., Disp: , Rfl:  .  gabapentin (NEURONTIN) 600 MG tablet, TAKE 2 TABLETS BY MOUTH  TWICE DAILY (Patient taking differently: Take 1,200 mg by mouth 2 (two) times daily. ), Disp: 360 tablet, Rfl: 3 .  Glucosamine Sulfate 1000 MG CAPS, Take 1 capsule by mouth daily., Disp: , Rfl:  .  hydrochlorothiazide (HYDRODIURIL) 25 MG tablet, Take 1 tablet (25 mg total) by mouth 2 (two) times daily., Disp: 180 tablet, Rfl: 3 .  Lecithin 400 MG CAPS, Take 400 mg by mouth daily., Disp: , Rfl:  .  losartan (COZAAR) 50 MG tablet, TAKE 1 TABLET BY MOUTH  DAILY (Patient taking differently: Take 50 mg by mouth daily. ), Disp: 90 tablet, Rfl: 3 .  Melatonin 3 MG CAPS, Take 3 mg by mouth at bedtime., Disp: , Rfl:  .  Omega 3 1000 MG CAPS, Take 1,000 mg by mouth daily., Disp: , Rfl:  .  omeprazole (PRILOSEC) 20 MG capsule, TAKE 1 CAPSULE BY MOUTH  TWICE  DAILY BEFORE MEALS (Patient taking differently: Take 20 mg by mouth 2 (two) times daily before a meal. ), Disp: 180 capsule, Rfl: 3 .  potassium chloride SA (KLOR-CON) 20 MEQ tablet, Take 1 tablet (20 mEq total) by mouth 2 (two) times daily., Disp: 180 tablet, Rfl: 3 .  pravastatin (PRAVACHOL) 80 MG tablet, TAKE 1 TABLET BY MOUTH  DAILY (Patient taking differently: Take 80 mg by mouth daily. ), Disp: 90 tablet, Rfl: 3 .  primidone (MYSOLINE) 50 MG tablet, TAKE 1 TABLET BY MOUTH 3  TIMES DAILY (Patient taking differently: Take 50 mg by mouth 3 (three) times daily. ), Disp: 270 tablet, Rfl: 3 .  Selenium 200 MCG CAPS,  Take 200 mcg by mouth daily., Disp: , Rfl:  .  sodium chloride 1 g tablet, Take 1 tablet (1 g total) by mouth 2 (two) times daily with a meal for 5 days., Disp: 10 tablet, Rfl: 0 .  tretinoin (RETIN-A) 0.05 % cream, Apply 1 application topically at bedtime., Disp: , Rfl:   ------------------------------------------------------------------------- Social History   Tobacco Use  . Smoking status: Never Smoker  . Smokeless tobacco: Never Used  Vaping Use  . Vaping Use: Never used  Substance Use Topics  . Alcohol use: Yes    Alcohol/week: 7.0 standard drinks    Types: 7 Glasses of wine per week    Comment: wine in the evening with dinner  . Drug use: No    Review of Systems Per HPI unless specifically indicated above     Objective:    BP 137/62   Pulse (!) 103   Temp (!) 97.5 F (36.4 C) (Temporal)   Resp 16   Ht 6' (1.829 m)   Wt 210 lb (95.3 kg)   SpO2 99%   BMI 28.48 kg/m   Wt Readings from Last 3 Encounters:  04/29/20 210 lb (95.3 kg)  04/22/20 205 lb (93 kg)  04/12/20 199 lb 15.3 oz (90.7 kg)    Physical Exam Vitals and nursing note reviewed.  Constitutional:      General: He is not in acute distress.    Appearance: He is well-developed. He is not diaphoretic.     Comments: Well-appearing, comfortable, cooperative  HENT:     Head: Normocephalic and atraumatic.  Eyes:     General:        Right eye: No discharge.        Left eye: No discharge.     Conjunctiva/sclera: Conjunctivae normal.  Cardiovascular:     Rate and Rhythm: Normal rate.  Pulmonary:     Effort: Pulmonary effort is normal.  Skin:    General: Skin is warm and dry.     Findings: Erythema (Cellulitis rash of Left knee anteriorly with warmth and similar margins from ED hospital photo, mild effusion) present. No rash.  Neurological:     Mental Status: He is alert and oriented to person, place, and time.  Psychiatric:        Behavior: Behavior normal.     Comments: Well groomed, good eye  contact, normal speech and thoughts       Results for orders placed or performed during the hospital encounter of 04/22/20  Blood culture (routine x 2)   Specimen: BLOOD  Result Value Ref Range   Specimen Description BLOOD BLOOD LEFT HAND    Special Requests      BOTTLES DRAWN AEROBIC AND ANAEROBIC Blood Culture adequate volume   Culture      NO GROWTH  5 DAYS Performed at [facility], [address] [place], [place] [address]    Report Status 04/27/2020 FINAL   Blood culture (routine x 2)   Specimen: BLOOD  Result Value Ref Range   Specimen Description BLOOD LEFT ANTECUBITAL    Special Requests      BOTTLES DRAWN AEROBIC AND ANAEROBIC Blood Culture adequate volume   Culture      NO GROWTH 5 DAYS Performed at [facility], [address] [place], [place] [address]    Report Status 04/27/2020 FINAL   Respiratory Panel by RT PCR (Flu A&B, Covid) - Nasopharyngeal Swab   Specimen: Nasopharyngeal Swab  Result Value Ref Range   SARS Coronavirus 2 by RT PCR NEGATIVE NEGATIVE   Influenza A by PCR NEGATIVE NEGATIVE   Influenza B by PCR NEGATIVE NEGATIVE  MRSA PCR Screening   Specimen: Nasopharyngeal  Result Value Ref Range   MRSA by PCR POSITIVE (A) NEGATIVE  Lactic acid, plasma  Result Value Ref Range   Lactic Acid, Venous 1.0 0.5 - 1.9 mmol/L  Lactic acid, plasma  Result Value Ref Range   Lactic Acid, Venous 1.0 0.5 - 1.9 mmol/L  Comprehensive metabolic panel  Result Value Ref Range   Sodium 129 (L) 135 - 145 mmol/L   Potassium 3.0 (L) 3.5 - 5.1 mmol/L   Chloride 90 (L) 98 - 111 mmol/L   CO2 27 22 - 32 mmol/L   Glucose, Bld 101 (H) 70 - 99 mg/dL   BUN 14 8 - 23 mg/dL   Creatinine, Ser 0.65 0.61 - 1.24 mg/dL   Calcium 9.4 8.9 - 10.3 mg/dL   Total Protein 7.5 6.5 - 8.1 g/dL   Albumin 4.0 3.5 - 5.0 g/dL   AST 18 15 - 41 U/L   ALT 15 0 - 44 U/L   Alkaline Phosphatase 68 38 - 126 U/L   Total Bilirubin 1.0 0.3 - 1.2 mg/dL   GFR, Estimated >60 >60  mL/min   Anion gap 12 5 - 15  CBC with Differential  Result Value Ref Range   WBC 9.7 4.0 - 10.5 K/uL   RBC 2.95 (L) 4.22 - 5.81 MIL/uL   Hemoglobin 11.0 (L) 13.0 - 17.0 g/dL   HCT 30.9 (L) 39 - 52 %   MCV 104.7 (H) 80.0 - 100.0 fL   MCH 37.3 (H) 26.0 - 34.0 pg   MCHC 35.6 30.0 - 36.0 g/dL   RDW 13.9 11.5 - 15.5 %   Platelets 205 150 - 400 K/uL   nRBC 0.0 0.0 - 0.2 %   Neutrophils Relative % 84 %   Neutro Abs 8.1 (H) 1.7 - 7.7 K/uL   Lymphocytes Relative 8 %   Lymphs Abs 0.8 0.7 - 4.0 K/uL   Monocytes Relative 7 %   Monocytes Absolute 0.7 0.1 - 1.0 K/uL   Eosinophils Relative 1 %   Eosinophils Absolute 0.1 0.0 - 0.5 K/uL   Basophils Relative 0 %   Basophils Absolute 0.0 0.0 - 0.1 K/uL   Immature Granulocytes 0 %   Abs Immature Granulocytes 0.04 0.00 - 0.07 K/uL  Urinalysis, Complete w Microscopic  Result Value Ref Range   Color, Urine YELLOW (A) YELLOW   APPearance CLEAR (A) CLEAR   Specific Gravity, Urine 1.011 1.005 - 1.030   pH 6.0 5.0 - 8.0   Glucose, UA NEGATIVE NEGATIVE mg/dL   Hgb urine dipstick SMALL (A) NEGATIVE   Bilirubin Urine NEGATIVE NEGATIVE   Ketones, ur NEGATIVE  NEGATIVE mg/dL   Protein, ur NEGATIVE NEGATIVE mg/dL   Nitrite NEGATIVE NEGATIVE   Leukocytes,Ua NEGATIVE NEGATIVE   RBC / HPF 0-5 0 - 5 RBC/hpf   WBC, UA 0-5 0 - 5 WBC/hpf   Bacteria, UA NONE SEEN NONE SEEN   Squamous Epithelial / LPF 0-5 0 - 5   Hyaline Casts, UA PRESENT   Osmolality  Result Value Ref Range   Osmolality 270 (L) 275 - 295 mOsm/kg  Osmolality, urine  Result Value Ref Range   Osmolality, Ur 423 300 - 900 mOsm/kg  Protein Electro, Random Urine  Result Value Ref Range   Total Protein, Urine 8.8 Not Estab. mg/dL   Albumin ELP, Urine 100.0 %   Alpha-1-Globulin, U 0.0 %   Alpha-2-Globulin, U 0.0 %   Beta Globulin, U 0.0 %   Gamma Globulin, U 0.0 %   M Component, Ur Not Observed Not Observed %   Please Note: Comment   Vitamin B12  Result Value Ref Range   Vitamin B-12  6,201 (H) 180 - 914 pg/mL  Folate, serum, performed at [facility] lab  Result Value Ref Range   Folate 45.0 >5.9 ng/mL  Iron and TIBC  Result Value Ref Range   Iron 40 (L) 45 - 182 ug/dL   TIBC 291 250 - 450 ug/dL   Saturation Ratios 14 (L) 17.9 - 39.5 %   UIBC 251 ug/dL  Brain natriuretic peptide  Result Value Ref Range   B Natriuretic Peptide 233.9 (H) 0.0 - 100.0 pg/mL  Magnesium  Result Value Ref Range   Magnesium <0.1 (LL) 1.7 - 2.4 mg/dL  Basic metabolic panel  Result Value Ref Range   Sodium 131 (L) 135 - 145 mmol/L   Potassium 3.3 (L) 3.5 - 5.1 mmol/L   Chloride 97 (L) 98 - 111 mmol/L   CO2 26 22 - 32 mmol/L   Glucose, Bld 94 70 - 99 mg/dL   BUN 14 8 - 23 mg/dL   Creatinine, Ser 0.76 0.61 - 1.24 mg/dL   Calcium 8.5 (L) 8.9 - 10.3 mg/dL   GFR, Estimated >60 >60 mL/min   Anion gap 8 5 - 15  CBC  Result Value Ref Range   WBC 6.3 4.0 - 10.5 K/uL   RBC 2.37 (L) 4.22 - 5.81 MIL/uL   Hemoglobin 8.9 (L) 13.0 - 17.0 g/dL   HCT 25.5 (L) 39 - 52 %   MCV 107.6 (H) 80.0 - 100.0 fL   MCH 37.6 (H) 26.0 - 34.0 pg   MCHC 34.9 30.0 - 36.0 g/dL   RDW 14.5 11.5 - 15.5 %   Platelets 169 150 - 400 K/uL   nRBC 0.0 0.0 - 0.2 %  Magnesium  Result Value Ref Range   Magnesium 2.3 1.7 - 2.4 mg/dL  Ferritin  Result Value Ref Range   Ferritin 118 24 - 336 ng/mL  Sodium, urine, random  Result Value Ref Range   Sodium, Ur 79 mmol/L  Phosphorus  Result Value Ref Range   Phosphorus 3.1 2.5 - 4.6 mg/dL  CBC with Differential/Platelet  Result Value Ref Range   WBC 7.0 4.0 - 10.5 K/uL   RBC 2.55 (L) 4.22 - 5.81 MIL/uL   Hemoglobin 9.7 (L) 13.0 - 17.0 g/dL   HCT 27.8 (L) 39 - 52 %   MCV 109.0 (H) 80.0 - 100.0 fL   MCH 38.0 (H) 26.0 - 34.0 pg   MCHC 34.9 30.0 - 36.0 g/dL   RDW 14.0 11.5 -  15.5 %   Platelets 209 150 - 400 K/uL   nRBC 0.0 0.0 - 0.2 %   Neutrophils Relative % 73 %   Neutro Abs 5.1 1.7 - 7.7 K/uL   Lymphocytes Relative 15 %   Lymphs Abs 1.1 0.7 - 4.0 K/uL    Monocytes Relative 8 %   Monocytes Absolute 0.6 0.1 - 1.0 K/uL   Eosinophils Relative 4 %   Eosinophils Absolute 0.3 0.0 - 0.5 K/uL   Basophils Relative 0 %   Basophils Absolute 0.0 0.0 - 0.1 K/uL   Immature Granulocytes 0 %   Abs Immature Granulocytes 0.02 0.00 - 0.07 K/uL  Basic metabolic panel  Result Value Ref Range   Sodium 128 (L) 135 - 145 mmol/L   Potassium 3.6 3.5 - 5.1 mmol/L   Chloride 95 (L) 98 - 111 mmol/L   CO2 28 22 - 32 mmol/L   Glucose, Bld 102 (H) 70 - 99 mg/dL   BUN 11 8 - 23 mg/dL   Creatinine, Ser 0.66 0.61 - 1.24 mg/dL   Calcium 8.6 (L) 8.9 - 10.3 mg/dL   GFR, Estimated >60 >60 mL/min   Anion gap 5 5 - 15  Magnesium  Result Value Ref Range   Magnesium 1.8 1.7 - 2.4 mg/dL  Basic metabolic panel  Result Value Ref Range   Sodium 133 (L) 135 - 145 mmol/L   Potassium 3.9 3.5 - 5.1 mmol/L   Chloride 100 98 - 111 mmol/L   CO2 23 22 - 32 mmol/L   Glucose, Bld 89 70 - 99 mg/dL   BUN 14 8 - 23 mg/dL   Creatinine, Ser 0.65 0.61 - 1.24 mg/dL   Calcium 8.5 (L) 8.9 - 10.3 mg/dL   GFR, Estimated >60 >60 mL/min   Anion gap 10 5 - 15  Magnesium  Result Value Ref Range   Magnesium 1.8 1.7 - 2.4 mg/dL  ECHOCARDIOGRAM COMPLETE  Result Value Ref Range   Weight 3,280 oz   Height 72 in   BP 134/75 mmHg   Ao pk vel 0.99 m/s   AV Mean grad 2.0 mmHg   AV Peak grad 3.9 mmHg   S' Lateral 3.85 cm   Area-P 1/2 3.50 cm2      Assessment & Plan:   Problem List Items Addressed This Visit    None    Visit Diagnoses    Cellulitis of left leg    -  Primary   Chronic heel ulcer, left, with unspecified severity (HCC)         Cellulitis, Left lower extremity localized to anterior knee Hospital follow-up now, had 3 days therapy with IV antibiotics and treatment, transitioned to oral antibiotic discharged to wound care  Chronic Left heel ulceration Managed by [facility]  Discharged on Clindamycin for cellulitis, 4 days, unresolved - had worsening. Now still has  persistent erythema warmth and localized cellulitis with knee effusion will start new antibiotic (unsure which one) today from wound care.  Continue cool compresses for swelling and pain. Stay ambulatory and help reduce swelling.  Follow up if not improved may need longer PO course antibiotic or possible HH PICC line for IV therapy if indicated.  No orders of the defined types were placed in this encounter.   Follow up plan: Return if symptoms worsen or fail to improve.   [name], DO [facility] 04/29/2020, 9:18 AM

## 2020-04-29 NOTE — Telephone Encounter (Signed)
I am not sure who the new oncologist will be. She was referred by her [facility] Dr Lanney Gins  She will need to check with his office to find out more information on the new oncology, I don't have that information.  [name], [facility] 04/29/2020, 5:09 PM

## 2020-04-29 NOTE — Patient Instructions (Addendum)
Thank you for coming to the office today.  Keep on new antibiotic once start, let me know which one it is. Follow with wound care to treat heel and knee.  If worsening spreading notify office or seek care immediately   Please schedule a Follow-up Appointment to: Return if symptoms worsen or fail to improve.  If you have any other questions or concerns, please feel free to call the office or send a message through [place]. You may also schedule an earlier appointment if necessary.  Additionally, you may be receiving a survey about your experience at our office within a few days to 1 week by e-mail or mail. We value your feedback.  [name], DO [place] [facility]

## 2020-04-29 NOTE — Telephone Encounter (Signed)
Copied from [place] [id number]. Topic: General - Other >> Apr 29, 2020  3:39 PM Hinda Lenis D wrote: Pts wife calling to let Dr Raliegh Ip know the name of the medication / she states he knows / linezolid (ZYVOX) 600 MG tablet [[id number]]

## 2020-04-29 NOTE — Telephone Encounter (Signed)
Alright. Thanks. I have added Linezolid to the med list - as the antibiotic from his wound care doctors.  This is a strong antibiotic and I am hopeful it will work.  [name], [facility] 04/29/2020, 4:45 PM

## 2020-04-30 NOTE — Telephone Encounter (Signed)
Please see telephone message from 04/30/2020 in [facility] chart.

## 2020-04-30 NOTE — Progress Notes (Signed)
Holt Holt ([id number]) Visit Report for 04/28/2020 Arrival Information Details Patient Name: Holt Holt Date of Service: 04/28/2020 12:30 PM Medical Record Number: [id number] Patient Account Number: [id number] Date of Birth/Sex: [birth year] (84 y.o. M) Treating RN: Dolan Amen Primary Care Aloysious Vangieson: [name] Other Clinician: Referring Durrel Mcnee: [name] Treating Ilena Dieckman/Extender: Skipper Cliche in Treatment: 11 Visit Information History Since Last Visit Pain Present Now: No Patient Arrived: Walker Arrival Time: 12:40 Accompanied By: self Transfer Assistance: None Patient Identification Verified: Yes Secondary Verification Process Completed: Yes Patient Requires Transmission-Based Precautions: No Patient Has Alerts: No Electronic Signature(s) Signed: 04/28/2020 5:14:04 PM By: Georges Mouse, Minus Breeding Entered By: Georges Mouse, Minus Breeding on 04/28/2020 12:41:31 Holt Holt ([id number]) -------------------------------------------------------------------------------- Clinic Level of Care Assessment Details Patient Name: Holt Holt Date of Service: 04/28/2020 12:30 PM Medical Record Number: [id number] Patient Account Number: [id number] Date of Birth/Sex: June 11, 1936 (84 y.o. M) Treating RN: Cornell Barman Primary Care Ozro Russett: [name] Other Clinician: Referring Doralee Kocak: [name] Treating Akili Corsetti/Extender: Skipper Cliche in Treatment: 11 Clinic Level of Care Assessment Items TOOL 4 Quantity Score []  - Use when only an EandM is performed on FOLLOW-UP visit 0 ASSESSMENTS - Nursing Assessment / Reassessment X - Reassessment of Co-morbidities (includes updates in patient status) 1 10 X- 1 5 Reassessment of Adherence to Treatment Plan ASSESSMENTS - Wound and Skin Assessment / Reassessment []  - Simple Wound Assessment / Reassessment - one wound 0 X- 2 5 Complex Wound Assessment / Reassessment - multiple wounds []  -  0 Dermatologic / Skin Assessment (not related to wound area) ASSESSMENTS - Focused Assessment []  - Circumferential Edema Measurements - multi extremities 0 []  - 0 Nutritional Assessment / Counseling / Intervention []  - 0 Lower Extremity Assessment (monofilament, tuning fork, pulses) []  - 0 Peripheral Arterial Disease Assessment (using hand held doppler) ASSESSMENTS - Ostomy and/or Continence Assessment and Care []  - Incontinence Assessment and Management 0 []  - 0 Ostomy Care Assessment and Management (repouching, etc.) PROCESS - Coordination of Care X - Simple Patient / Family Education for ongoing care 1 15 []  - 0 Complex (extensive) Patient / Family Education for ongoing care X- 1 10 Staff obtains Consents, Records, Test Results / Process Orders []  - 0 Staff telephones HHA, Nursing Homes / Clarify orders / etc []  - 0 Routine Transfer to another Facility (non-emergent condition) []  - 0 Routine Hospital Admission (non-emergent condition) []  - 0 New Admissions / Biomedical engineer / Ordering NPWT, Apligraf, etc. []  - 0 Emergency Hospital Admission (emergent condition) X- 1 10 Simple Discharge Coordination []  - 0 Complex (extensive) Discharge Coordination PROCESS - Special Needs []  - Pediatric / Minor Patient Management 0 []  - 0 Isolation Patient Management []  - 0 Hearing / Language / Visual special needs []  - 0 Assessment of Community assistance (transportation, D/C planning, etc.) []  - 0 Additional assistance / Altered mentation []  - 0 Support Surface(s) Assessment (bed, cushion, seat, etc.) INTERVENTIONS - Wound Cleansing / Measurement Holt Holt ([id number]) []  - 0 Simple Wound Cleansing - one wound X- 2 5 Complex Wound Cleansing - multiple wounds X- 1 5 Wound Imaging (photographs - any number of wounds) []  - 0 Wound Tracing (instead of photographs) []  - 0 Simple Wound Measurement - one wound X- 2 5 Complex Wound Measurement - multiple  wounds INTERVENTIONS - Wound Dressings []  - Small Wound Dressing one or multiple wounds 0 []  - 0 Medium Wound Dressing one or multiple wounds X- 1 20 Large Wound Dressing one or multiple wounds []  - 0  Application of Medications - topical []  - 0 Application of Medications - injection INTERVENTIONS - Miscellaneous []  - External ear exam 0 []  - 0 Specimen Collection (cultures, biopsies, blood, body fluids, etc.) []  - 0 Specimen(s) / Culture(s) sent or taken to Lab for analysis []  - 0 Patient Transfer (multiple staff / Civil Service fast streamer / Similar devices) []  - 0 Simple Staple / Suture removal (25 or less) []  - 0 Complex Staple / Suture removal (26 or more) []  - 0 Hypo / Hyperglycemic Management (close monitor of Blood Glucose) []  - 0 Ankle / Brachial Index (ABI) - do not check if billed separately X- 1 5 Vital Signs Has the patient been seen at the hospital within the last three years: Yes Total Score: 110 Level Of Care: New/Established - Level 3 Electronic Signature(s) Signed: 04/30/2020 5:12:38 PM By: Gretta Cool, BSN, RN, CWS, Kim RN, BSN Entered By: Gretta Cool, BSN, RN, CWS, Kim on 04/28/2020 13:25:44 Holt Holt ([id number]) -------------------------------------------------------------------------------- Encounter Discharge Information Details Patient Name: Holt Holt Date of Service: 04/28/2020 12:30 PM Medical Record Number: [id number] Patient Account Number: [id number] Date of Birth/Sex: [birth year] (84 y.o. M) Treating RN: Cornell Barman Primary Care Oleg Oleson: [name] Other Clinician: Referring Bhavya Grand: [name] Treating Khori Rosevear/Extender: Skipper Cliche in Treatment: 11 Encounter Discharge Information Items Discharge Condition: Stable Ambulatory Status: Ambulatory Discharge Destination: Home Transportation: Private Auto Schedule Follow-up Appointment: Yes Clinical Summary of Care: Electronic Signature(s) Signed: 04/30/2020 5:12:38 PM By: Gretta Cool, BSN,  RN, CWS, Kim RN, BSN Entered By: Gretta Cool, BSN, RN, CWS, Kim on 04/28/2020 13:27:01 Holt Holt ([id number]) -------------------------------------------------------------------------------- Lower Extremity Assessment Details Patient Name: Holt Holt Date of Service: 04/28/2020 12:30 PM Medical Record Number: [id number] Patient Account Number: [id number] Date of Birth/Sex: [birth year] (84 y.o. M) Treating RN: Dolan Amen Primary Care Daylan Boggess: [name] Other Clinician: Referring Yitzchak Kothari: [name] Treating Taylour Lietzke/Extender: Skipper Cliche in Treatment: 11 Electronic Signature(s) Signed: 04/28/2020 5:14:04 PM By: Georges Mouse, Minus Breeding Entered By: Georges Mouse, Minus Breeding on 04/28/2020 12:55:37 Holt Holt ([id number]) -------------------------------------------------------------------------------- Multi Wound Chart Details Patient Name: Holt Holt Date of Service: 04/28/2020 12:30 PM Medical Record Number: [id number] Patient Account Number: [id number] Date of Birth/Sex: 10-Aug-1935 (84 y.o. M) Treating RN: Cornell Barman Primary Care Shervon Kerwin: [name] Other Clinician: Referring Mc Hollen: [name] Treating Margarite Vessel/Extender: Skipper Cliche in Treatment: 11 Vital Signs Height(in): 72 Pulse(bpm): 67 Weight(lbs): 200 Blood Pressure(mmHg): 130/80 Body Mass Index(BMI): 27 Temperature(F): 98.1 Respiratory Rate(breaths/min): 18 Photos: [N/A:N/A] Wound Location: Left, Plantar Calcaneus Left, Proximal, Plantar Foot N/A Wounding Event: Trauma Not Known N/A Primary Etiology: Trauma, Other Trauma, Other N/A Secondary Etiology: Pressure Ulcer N/A N/A Comorbid History: Hypertension, Myocardial Infarction, Hypertension, Myocardial Infarction, N/A Osteoarthritis, Neuropathy Osteoarthritis, Neuropathy Date Acquired: 10/21/2019 04/22/2020 N/A Weeks of Treatment: 11 0 N/A Wound Status: Open Open N/A Measurements L x W x D (cm) 0.4x0.4x0.2  0.2x2.6x0.1 N/A Area (cm) : 0.126 0.408 N/A Volume (cm) : 0.025 0.041 N/A % Reduction in Area: 35.70% -108.20% N/A % Reduction in Volume: 68.40% -105.00% N/A Classification: Full Thickness Without Exposed Full Thickness Without Exposed N/A Support Structures Support Structures Exudate Amount: Medium Small N/A Exudate Type: Serosanguineous N/A N/A Exudate Color: red, brown N/A N/A Wound Margin: Flat and Intact N/A N/A Granulation Amount: Large (67-100%) Small (1-33%) N/A Granulation Quality: Pink, Pale Red, Pink N/A Necrotic Amount: Small (1-33%) None Present (0%) N/A Exposed Structures: Fat Layer (Subcutaneous Tissue): Fat Layer (Subcutaneous Tissue): N/A Yes Yes Fascia: No Fascia: No Tendon: No Tendon: No Muscle: No Muscle: No Joint: No Joint:  No Bone: No Bone: No Epithelialization: None None N/A Treatment Notes Electronic Signature(s) Signed: 04/30/2020 5:12:38 PM By: Gretta Cool, BSN, RN, CWS, Kim RN, BSN Entered By: Gretta Cool, BSN, RN, CWS, Kim on 04/28/2020 13:23:22 Holt Holt ([id number]) -------------------------------------------------------------------------------- Multi-Disciplinary Care Plan Details Patient Name: Holt Holt Date of Service: 04/28/2020 12:30 PM Medical Record Number: [id number] Patient Account Number: [id number] Date of Birth/Sex: February 03, 1936 (84 y.o. M) Treating RN: Cornell Barman Primary Care Robey Massmann: [name] Other Clinician: Referring Chiniqua Kilcrease: [name] Treating Landyn Buckalew/Extender: Skipper Cliche in Treatment: 11 Active Inactive Necrotic Tissue Nursing Diagnoses: Impaired tissue integrity related to necrotic/devitalized tissue Knowledge deficit related to management of necrotic/devitalized tissue Goals: Necrotic/devitalized tissue will be minimized in the wound bed Date Initiated: 04/14/2020 Target Resolution Date: 05/14/2020 Goal Status: Active Patient/caregiver will verbalize understanding of reason and process for  debridement of necrotic tissue Date Initiated: 04/14/2020 Target Resolution Date: 05/14/2020 Goal Status: Active Interventions: Assess patient pain level pre-, during and post procedure and prior to discharge Provide education on necrotic tissue and debridement process Treatment Activities: Apply topical anesthetic as ordered : 04/14/2020 Excisional debridement : 04/14/2020 Notes: Orientation to the Wound Care Program Nursing Diagnoses: Knowledge deficit related to the wound healing center program Goals: Patient/caregiver will verbalize understanding of the [place] Program Date Initiated: 02/11/2020 Target Resolution Date: 02/15/2020 Goal Status: Active Interventions: Provide education on orientation to the wound center Notes: Wound/Skin Impairment Nursing Diagnoses: Impaired tissue integrity Knowledge deficit related to ulceration/compromised skin integrity Goals: Patient/caregiver will verbalize understanding of skin care regimen Date Initiated: 02/11/2020 Target Resolution Date: 02/24/2020 Goal Status: Active Interventions: Assess patient/caregiver ability to obtain necessary supplies Assess patient/caregiver ability to perform ulcer/skin care regimen upon admission and as needed Assess ulceration(s) every visit Doke, [name] ([id number]) Provide education on ulcer and skin care Treatment Activities: Skin care regimen initiated : 02/11/2020 Notes: Electronic Signature(s) Signed: 04/30/2020 5:12:38 PM By: Gretta Cool, BSN, RN, CWS, Kim RN, BSN Entered By: Gretta Cool, BSN, RN, CWS, Kim on 04/28/2020 13:23:13 Holt Holt ([id number]) -------------------------------------------------------------------------------- Pain Assessment Details Patient Name: Holt Holt Date of Service: 04/28/2020 12:30 PM Medical Record Number: [id number] Patient Account Number: [id number] Date of Birth/Sex: [birth year] (84 y.o. M) Treating RN: Dolan Amen Primary Care Gelsey Amyx: [name]  Other Clinician: Referring Gilberto Streck: [name] Treating Josiah Wojtaszek/Extender: Skipper Cliche in Treatment: 11 Active Problems Location of Pain Severity and Description of Pain Patient Has Paino No Site Locations Rate the pain. Current Pain Level: 0 Pain Management and Medication Current Pain Management: Electronic Signature(s) Signed: 04/28/2020 5:14:04 PM By: Georges Mouse, Minus Breeding Entered By: Georges Mouse, Minus Breeding on 04/28/2020 12:41:59 Holt Holt ([id number]) -------------------------------------------------------------------------------- Patient/Caregiver Education Details Patient Name: Holt Holt Date of Service: 04/28/2020 12:30 PM Medical Record Number: [id number] Patient Account Number: [id number] Date of Birth/Gender: November 19, 1935 (84 y.o. M) Treating RN: Cornell Barman Primary Care Physician: [name] Other Clinician: Referring Physician: [name] Treating Physician/Extender: Skipper Cliche in Treatment: 11 Education Assessment Education Provided To: Patient Education Topics Provided Wound/Skin Impairment: Handouts: Caring for Your Ulcer Methods: Demonstration, Explain/Verbal Responses: State content correctly Electronic Signature(s) Signed: 04/30/2020 5:12:38 PM By: Gretta Cool, BSN, RN, CWS, Kim RN, BSN Entered By: Gretta Cool, BSN, RN, CWS, Kim on 04/28/2020 13:26:03 Holt Holt ([id number]) -------------------------------------------------------------------------------- Wound Assessment Details Patient Name: Holt Holt Date of Service: 04/28/2020 12:30 PM Medical Record Number: [id number] Patient Account Number: [id number] Date of Birth/Sex: 03-19-1936 (84 y.o. M) Treating RN: Dolan Amen Primary Care Earlisha Sharples: [name] Other Clinician: Referring Deisha Stull: [name] Treating Jazaria Jarecki/Extender: Skipper Cliche in  Treatment: 11 Wound Status Wound Number: 1 Primary Etiology: Trauma, Other Wound  Location: Left, Plantar Calcaneus Secondary Pressure Ulcer Etiology: Wounding Event: Trauma Wound Status: Open Date Acquired: 10/21/2019 Comorbid Hypertension, Myocardial Infarction, Osteoarthritis, Weeks Of Treatment: 11 History: Neuropathy Clustered Wound: No Photos Wound Measurements Length: (cm) 0.4 Width: (cm) 0.4 Depth: (cm) 0.2 Area: (cm) 0.126 Volume: (cm) 0.025 % Reduction in Area: 35.7% % Reduction in Volume: 68.4% Epithelialization: None Tunneling: No Undermining: No Wound Description Classification: Full Thickness Without Exposed Support Structures Wound Margin: Flat and Intact Exudate Amount: Medium Exudate Type: Serosanguineous Exudate Color: red, brown Foul Odor After Cleansing: No Slough/Fibrino No Wound Bed Granulation Amount: Large (67-100%) Exposed Structure Granulation Quality: Pink, Pale Fascia Exposed: No Necrotic Amount: Small (1-33%) Fat Layer (Subcutaneous Tissue) Exposed: Yes Necrotic Quality: Adherent Slough Tendon Exposed: No Muscle Exposed: No Joint Exposed: No Bone Exposed: No Treatment Notes Wound #1 (Left, Plantar Calcaneus) Notes S Cell, heel cup secured with stretch net Electronic Signature(s) Signed: 04/28/2020 5:14:04 PM By: Marita Snellen, [place] ([id number]) Entered By: Georges Mouse, Minus Breeding on 04/28/2020 12:52:17 Holt Holt ([id number]) -------------------------------------------------------------------------------- Wound Assessment Details Patient Name: Holt Holt Date of Service: 04/28/2020 12:30 PM Medical Record Number: [id number] Patient Account Number: [id number] Date of Birth/Sex: [birth year] (84 y.o. M) Treating RN: Dolan Amen Primary Care Effie Wahlert: [name] Other Clinician: Referring Bruchy Mikel: [name] Treating Yuriko Portales/Extender: Skipper Cliche in Treatment: 11 Wound Status Wound Number: 2 Primary Trauma, Other Etiology: Wound Location: Left, Proximal, Plantar  Foot Wound Status: Open Wounding Event: Not Known Comorbid Hypertension, Myocardial Infarction, Osteoarthritis, Date Acquired: 04/22/2020 History: Neuropathy Weeks Of Treatment: 0 Clustered Wound: No Photos Wound Measurements Length: (cm) 0.2 Width: (cm) 2.6 Depth: (cm) 0.1 Area: (cm) 0.408 Volume: (cm) 0.041 % Reduction in Area: -108.2% % Reduction in Volume: -105% Epithelialization: None Tunneling: No Undermining: No Wound Description Classification: Full Thickness Without Exposed Support Structures Exudate Amount: Small Foul Odor After Cleansing: No Slough/Fibrino No Wound Bed Granulation Amount: Small (1-33%) Exposed Structure Granulation Quality: Red, Pink Fascia Exposed: No Necrotic Amount: None Present (0%) Fat Layer (Subcutaneous Tissue) Exposed: Yes Tendon Exposed: No Muscle Exposed: No Joint Exposed: No Bone Exposed: No Treatment Notes Wound #2 (Left, Proximal, Plantar Foot) Notes S Cell, heel cup secured with stretch net Electronic Signature(s) Signed: 04/28/2020 5:14:04 PM By: Georges Mouse, [place] Entered By: Georges Mouse, Minus Breeding on 04/28/2020 12:53:08 Agyeman, [name] ([id number] Holt ([id number]) -------------------------------------------------------------------------------- Vitals Details Patient Name: Holt Holt Date of Service: 04/28/2020 12:30 PM Medical Record Number: [id number] Patient Account Number: [id number] Date of Birth/Sex: Nov 23, 1935 (84 y.o. M) Treating RN: Dolan Amen Primary Care Krzysztof Reichelt: [name] Other Clinician: Referring Keawe Marcello: [name] Treating Dimetrius Montfort/Extender: Skipper Cliche in Treatment: 11 Vital Signs Time Taken: 12:41 Temperature (F): 98.1 Height (in): 72 Pulse (bpm): 98 Weight (lbs): 200 Respiratory Rate (breaths/min): 18 Body Mass Index (BMI): 27.1 Blood Pressure (mmHg): 130/80 Reference Range: 80 - 120 mg / dl Electronic Signature(s) Signed: 04/28/2020 5:14:04 PM  By: Georges Mouse, Minus Breeding Entered By: Georges Mouse, Minus Breeding on 04/28/2020 12:41:51

## 2020-05-01 DIAGNOSIS — M21372 Foot drop, left foot: Secondary | ICD-10-CM | POA: Diagnosis not present

## 2020-05-01 DIAGNOSIS — Z9181 History of falling: Secondary | ICD-10-CM | POA: Diagnosis not present

## 2020-05-01 DIAGNOSIS — R2681 Unsteadiness on feet: Secondary | ICD-10-CM | POA: Diagnosis not present

## 2020-05-01 DIAGNOSIS — M21371 Foot drop, right foot: Secondary | ICD-10-CM | POA: Diagnosis not present

## 2020-05-01 DIAGNOSIS — R260 Ataxic gait: Secondary | ICD-10-CM | POA: Diagnosis not present

## 2020-05-01 DIAGNOSIS — R262 Difficulty in walking, not elsewhere classified: Secondary | ICD-10-CM | POA: Diagnosis not present

## 2020-05-05 ENCOUNTER — Other Ambulatory Visit: Payer: Self-pay

## 2020-05-05 ENCOUNTER — Encounter: Payer: Medicare Other | Admitting: Physician Assistant

## 2020-05-05 DIAGNOSIS — M21371 Foot drop, right foot: Secondary | ICD-10-CM | POA: Diagnosis not present

## 2020-05-05 DIAGNOSIS — L97512 Non-pressure chronic ulcer of other part of right foot with fat layer exposed: Secondary | ICD-10-CM | POA: Diagnosis not present

## 2020-05-05 DIAGNOSIS — Z885 Allergy status to narcotic agent status: Secondary | ICD-10-CM | POA: Diagnosis not present

## 2020-05-05 DIAGNOSIS — G603 Idiopathic progressive neuropathy: Secondary | ICD-10-CM | POA: Diagnosis not present

## 2020-05-05 DIAGNOSIS — L89623 Pressure ulcer of left heel, stage 3: Secondary | ICD-10-CM | POA: Diagnosis not present

## 2020-05-05 DIAGNOSIS — M21372 Foot drop, left foot: Secondary | ICD-10-CM | POA: Diagnosis not present

## 2020-05-05 NOTE — Progress Notes (Addendum)
[name], [name] ([id number]) Visit Report for 05/05/2020 Arrival Information Details Patient Name: [name], [name] Date of Service: 05/05/2020 12:45 PM Medical Record Number: [id number] Patient Account Number: [id number] Date of Birth/Sex: 16-Feb-1936 (84 y.o. M) Treating RN: Stann Mainland Primary Care Maleea Camilo: [name] Other Clinician: Referring Cristan Scherzer: [name] Treating Esaiah Wanless/Extender: Skipper Cliche in Treatment: 12 Visit Information History Since Last Visit Added or deleted any medications: No Patient Arrived: Walker Any new allergies or adverse reactions: No Arrival Time: 13:10 Had a fall or experienced change in No Accompanied By: self activities of daily living that may affect Transfer Assistance: None risk of falls: Patient Identification Verified: Yes Signs or symptoms of abuse/neglect since last visito No Secondary Verification Process Completed: Yes Hospitalized since last visit: No Patient Requires Transmission-Based Precautions: No Implantable device outside of the clinic excluding No Patient Has Alerts: No cellular tissue based products placed in the center since last visit: Has Dressing in Place as Prescribed: Yes Pain Present Now: No Electronic Signature(s) Signed: 05/05/2020 3:53:56 PM By: Deon Pilling Entered By: Deon Pilling on 05/05/2020 13:19:15 Zullo, [name] ([id number]) -------------------------------------------------------------------------------- Clinic Level of Care Assessment Details Patient Name: [name] Date of Service: 05/05/2020 12:45 PM Medical Record Number: [id number] Patient Account Number: [id number] Date of Birth/Sex: Oct 19, 1935 (84 y.o. M) Treating RN: Cornell Barman Primary Care Shyra Emile: [name] Other Clinician: Referring Kelcie Currie: [name] Treating Monica Codd/Extender: Skipper Cliche in Treatment: 12 Clinic Level of Care Assessment Items TOOL 4 Quantity Score []  - Use when only  an EandM is performed on FOLLOW-UP visit 0 ASSESSMENTS - Nursing Assessment / Reassessment X - Reassessment of Co-morbidities (includes updates in patient status) 1 10 X- 1 5 Reassessment of Adherence to Treatment Plan ASSESSMENTS - Wound and Skin Assessment / Reassessment []  - Simple Wound Assessment / Reassessment - one wound 0 X- 2 5 Complex Wound Assessment / Reassessment - multiple wounds []  - 0 Dermatologic / Skin Assessment (not related to wound area) ASSESSMENTS - Focused Assessment X - Circumferential Edema Measurements - multi extremities 1 5 []  - 0 Nutritional Assessment / Counseling / Intervention []  - 0 Lower Extremity Assessment (monofilament, tuning fork, pulses) []  - 0 Peripheral Arterial Disease Assessment (using hand held doppler) ASSESSMENTS - Ostomy and/or Continence Assessment and Care []  - Incontinence Assessment and Management 0 []  - 0 Ostomy Care Assessment and Management (repouching, etc.) PROCESS - Coordination of Care X - Simple Patient / Family Education for ongoing care 1 15 []  - 0 Complex (extensive) Patient / Family Education for ongoing care []  - 0 Staff obtains Programmer, systems, Records, Test Results / Process Orders []  - 0 Staff telephones HHA, Nursing Homes / Clarify orders / etc []  - 0 Routine Transfer to another Facility (non-emergent condition) []  - 0 Routine Hospital Admission (non-emergent condition) []  - 0 New Admissions / Biomedical engineer / Ordering NPWT, Apligraf, etc. []  - 0 Emergency Hospital Admission (emergent condition) X- 1 10 Simple Discharge Coordination []  - 0 Complex (extensive) Discharge Coordination PROCESS - Special Needs []  - Pediatric / Minor Patient Management 0 []  - 0 Isolation Patient Management []  - 0 Hearing / Language / Visual special needs []  - 0 Assessment of Community assistance (transportation, D/C planning, etc.) []  - 0 Additional assistance / Altered mentation []  - 0 Support Surface(s)  Assessment (bed, cushion, seat, etc.) INTERVENTIONS - Wound Cleansing / Measurement Osuna, Fines ([id number]) []  - 0 Simple Wound Cleansing - one wound X- 2 5 Complex Wound Cleansing - multiple wounds X- 1 5 Wound  Imaging (photographs - any number of wounds) $RemoveBe'[]'wnZAmfyIv$  - 0 Wound Tracing (instead of photographs) $RemoveBeforeD'[]'DglBKLInVDZVyi$  - 0 Simple Wound Measurement - one wound X- 2 5 Complex Wound Measurement - multiple wounds INTERVENTIONS - Wound Dressings $RemoveBeforeD'[]'fJfRztNjMatfjJ$  - Small Wound Dressing one or multiple wounds 0 X- 2 15 Medium Wound Dressing one or multiple wounds $RemoveBeforeD'[]'pLDxgNHZqIwAxw$  - 0 Large Wound Dressing one or multiple wounds $RemoveBeforeD'[]'PewQnLZuPLPtJC$  - 0 Application of Medications - topical $RemoveB'[]'zoQHKfaW$  - 0 Application of Medications - injection INTERVENTIONS - Miscellaneous $RemoveBeforeD'[]'RsPSQowaaOLPau$  - External ear exam 0 $Remo'[]'wotIK$  - 0 Specimen Collection (cultures, biopsies, blood, body fluids, etc.) $RemoveBefor'[]'rqXPiJeegPpU$  - 0 Specimen(s) / Culture(s) sent or taken to Lab for analysis $RemoveBefo'[]'bvtcVzBZHNC$  - 0 Patient Transfer (multiple staff / Civil Service fast streamer / Similar devices) $RemoveBeforeDE'[]'OROGHvfXQhvHPrl$  - 0 Simple Staple / Suture removal (25 or less) $Remove'[]'MzLmopX$  - 0 Complex Staple / Suture removal (26 or more) $Remove'[]'SqHRSLY$  - 0 Hypo / Hyperglycemic Management (close monitor of Blood Glucose) $RemoveBefore'[]'yfWgVyjmZZaFM$  - 0 Ankle / Brachial Index (ABI) - do not check if billed separately X- 1 5 Vital Signs Has the patient been seen at the hospital within the last three years: Yes Total Score: 115 Level Of Care: New/Established - Level 3 Electronic Signature(s) Signed: 05/05/2020 5:19:05 PM By: Gretta Cool, BSN, RN, CWS, Kim RN, BSN Entered By: Gretta Cool, BSN, RN, CWS, Kim on 05/05/2020 13:45:47 [name] ([id number]) -------------------------------------------------------------------------------- Encounter Discharge Information Details Patient Name: [name] Date of Service: 05/05/2020 12:45 PM Medical Record Number: [id number] Patient Account Number: [id number] Date of Birth/Sex: 07-17-1935 (84 y.o. M) Treating RN: Carlene Coria Primary Care Elspeth Blucher: [name] Other  Clinician: Referring Krayton Wortley: [name] Treating Antrice Pal/Extender: Skipper Cliche in Treatment: 12 Encounter Discharge Information Items Discharge Condition: Stable Ambulatory Status: Walker Discharge Destination: Home Transportation: Private Auto Accompanied By: self Schedule Follow-up Appointment: Yes Clinical Summary of Care: Patient Declined Electronic Signature(s) Signed: 05/05/2020 5:17:28 PM By: Carlene Coria RN Entered By: Carlene Coria on 05/05/2020 13:55:23 Sinagra, [name] ([id number]) -------------------------------------------------------------------------------- Lower Extremity Assessment Details Patient Name: [name] Date of Service: 05/05/2020 12:45 PM Medical Record Number: [id number] Patient Account Number: [id number] Date of Birth/Sex: 03/19/1936 (84 y.o. M) Treating RN: Stann Mainland Primary Care Keyaira Clapham: [name] Other Clinician: Referring Jayelle Page: [name] Treating Ephrem Carrick/Extender: Skipper Cliche in Treatment: 12 Edema Assessment Assessed: [Left: Yes] Patrice Paradise: Yes] Edema: [Left: Yes] [Right: Yes] Calf Left: Right: Point of Measurement: From Medial Instep 41 cm 40 cm Ankle Left: Right: Point of Measurement: From Medial Instep 26 cm 26 cm Vascular Assessment Pulses: Dorsalis Pedis Palpable: [Left:Yes] [Right:Yes] Electronic Signature(s) Signed: 05/05/2020 3:53:56 PM By: Deon Pilling Entered By: Deon Pilling on 05/05/2020 13:20:06 Guia, [name] ([id number]) -------------------------------------------------------------------------------- Multi Wound Chart Details Patient Name: [name] Date of Service: 05/05/2020 12:45 PM Medical Record Number: [id number] Patient Account Number: [id number] Date of Birth/Sex: 1935/10/21 (84 y.o. M) Treating RN: Cornell Barman Primary Care Lindon Kiel: [name] Other Clinician: Referring Gleb Mcguire: [name] Treating Aahan Marques/Extender: Skipper Cliche  in Treatment: 12 Vital Signs Height(in): 72 Pulse(bpm): 33 Weight(lbs): 200 Blood Pressure(mmHg): 135/78 Body Mass Index(BMI): 27 Temperature(F): 98.5 Respiratory Rate(breaths/min): 16 Photos: [2:No Photos] Wound Location: Left, Plantar Calcaneus Left, Proximal, Plantar Foot Right, Dorsal Foot Wounding Event: Trauma Not Known Footwear Injury Primary Etiology: Trauma, Other Trauma, Other Abrasion Secondary Etiology: Pressure Ulcer N/A N/A Comorbid History: Hypertension, Myocardial Infarction, N/A Hypertension, Myocardial Infarction, Osteoarthritis, Neuropathy Osteoarthritis, Neuropathy Date Acquired: 10/21/2019 04/22/2020 04/30/2020 Weeks of Treatment: 12 1 0 Wound Status: Open Open Open Measurements L x W x D (cm)  0.3x0.3x0.2 0x0x0 8.3x8x0.1 Area (cm) : 0.071 0 52.15 Volume (cm) : 0.014 0 5.215 % Reduction in Area: 63.80% 100.00% 0.00% % Reduction in Volume: 82.30% 100.00% 0.00% Classification: Full Thickness Without Exposed Full Thickness Without Exposed Full Thickness Without Exposed Support Structures Support Structures Support Structures Exudate Amount: Medium N/A Medium Exudate Type: Serosanguineous N/A Serosanguineous Exudate Color: red, brown N/A red, brown Wound Margin: Flat and Intact N/A Distinct, outline attached Granulation Amount: Large (67-100%) N/A Large (67-100%) Granulation Quality: Pink, Pale N/A Red Necrotic Amount: None Present (0%) N/A None Present (0%) Exposed Structures: Fat Layer (Subcutaneous Tissue): N/A Fat Layer (Subcutaneous Tissue): Yes Yes Fascia: No Fascia: No Tendon: No Tendon: No Muscle: No Muscle: No Joint: No Joint: No Bone: No Bone: No Epithelialization: None N/A None Treatment Notes Electronic Signature(s) Signed: 05/05/2020 5:19:05 PM By: Gretta Cool, BSN, RN, CWS, Kim RN, BSN Entered By: Gretta Cool, BSN, RN, CWS, Kim on 05/05/2020 13:37:38 [place], [name]  ([id number]) -------------------------------------------------------------------------------- Multi-Disciplinary Care Plan Details Patient Name: [name] Date of Service: 05/05/2020 12:45 PM Medical Record Number: [id number] Patient Account Number: [id number] Date of Birth/Sex: [birth year] (84 y.o. M) Treating RN: Cornell Barman Primary Care Addaleigh Nicholls: [name] Other Clinician: Referring Camelia Stelzner: [name] Treating Amarachi Kotz/Extender: Skipper Cliche in Treatment: 12 Active Inactive Necrotic Tissue Nursing Diagnoses: Impaired tissue integrity related to necrotic/devitalized tissue Knowledge deficit related to management of necrotic/devitalized tissue Goals: Necrotic/devitalized tissue will be minimized in the wound bed Date Initiated: 04/14/2020 Target Resolution Date: 05/14/2020 Goal Status: Active Patient/caregiver will verbalize understanding of reason and process for debridement of necrotic tissue Date Initiated: 04/14/2020 Target Resolution Date: 05/14/2020 Goal Status: Active Interventions: Assess patient pain level pre-, during and post procedure and prior to discharge Provide education on necrotic tissue and debridement process Treatment Activities: Apply topical anesthetic as ordered : 04/14/2020 Excisional debridement : 04/14/2020 Notes: Wound/Skin Impairment Nursing Diagnoses: Impaired tissue integrity Knowledge deficit related to ulceration/compromised skin integrity Goals: Patient/caregiver will verbalize understanding of skin care regimen Date Initiated: 02/11/2020 Date Inactivated: 05/05/2020 Target Resolution Date: 02/24/2020 Goal Status: Met Ulcer/skin breakdown will have a volume reduction of 80% by week 12 Date Initiated: 05/05/2020 Target Resolution Date: 05/05/2020 Goal Status: Active Ulcer/skin breakdown will heal within 14 weeks Date Initiated: 05/05/2020 Target Resolution Date: 05/19/2020 Goal Status:  Active Interventions: Assess patient/caregiver ability to obtain necessary supplies Assess patient/caregiver ability to perform ulcer/skin care regimen upon admission and as needed Assess ulceration(s) every visit Provide education on ulcer and skin care Treatment Activities: Skin care regimen initiated : 02/11/2020 Notes: [name], [name] ([id number]) Electronic Signature(s) Signed: 05/05/2020 1:48:06 PM By: Gretta Cool, BSN, RN, CWS, Kim RN, BSN Previous Signature: 05/05/2020 1:46:50 PM Version By: Gretta Cool, BSN, RN, CWS, Kim RN, BSN Entered By: Gretta Cool, BSN, RN, CWS, Kim on 05/05/2020 13:48:06 Levitz, [name] ([id number]) -------------------------------------------------------------------------------- Pain Assessment Details Patient Name: [name] Date of Service: 05/05/2020 12:45 PM Medical Record Number: [id number] Patient Account Number: [id number] Date of Birth/Sex: [birth year] (84 y.o. M) Treating RN: Stann Mainland Primary Care Jacobb Alen: [name] Other Clinician: Referring Avilyn Virtue: [name] Treating Chanay Nugent/Extender: Skipper Cliche in Treatment: 12 Active Problems Location of Pain Severity and Description of Pain Patient Has Paino No Site Locations Rate the pain. Current Pain Level: 0 Pain Management and Medication Current Pain Management: Medication: No Cold Application: No Rest: No Massage: No Activity: No T.E.N.S.: No Heat Application: No Leg drop or elevation: No Is the Current Pain Management Adequate: Adequate How does your wound impact your activities of daily livingo Sleep: No Bathing:  No Appetite: No Relationship With Others: No Bladder Continence: No Emotions: No Bowel Continence: No Work: No Toileting: No Drive: No Dressing: No Hobbies: No Electronic Signature(s) Signed: 05/05/2020 3:53:56 PM By: Deon Pilling Entered By: Deon Pilling on 05/05/2020 13:19:41 Bakos, [name]  ([id number]) -------------------------------------------------------------------------------- Patient/Caregiver Education Details Patient Name: [name] Date of Service: 05/05/2020 12:45 PM Medical Record Number: [id number] Patient Account Number: [id number] Date of Birth/Gender: [birth year] (84 y.o. M) Treating RN: Cornell Barman Primary Care Physician: [name] Other Clinician: Referring Physician: [name] Treating Physician/Extender: Skipper Cliche in Treatment: 12 Education Assessment Education Provided To: Patient Education Topics Provided Wound/Skin Impairment: Handouts: Caring for Your Ulcer, Other: continue wound care as prescribed Methods: Demonstration Responses: State content correctly Electronic Signature(s) Signed: 05/05/2020 5:19:05 PM By: Gretta Cool, BSN, RN, CWS, Kim RN, BSN Entered By: Gretta Cool, BSN, RN, CWS, Kim on 05/05/2020 13:46:11 Zaborowski, [name] ([id number]) -------------------------------------------------------------------------------- Wound Assessment Details Patient Name: [name] Date of Service: 05/05/2020 12:45 PM Medical Record Number: [id number] Patient Account Number: [id number] Date of Birth/Sex: August 02, 1935 (84 y.o. M) Treating RN: Stann Mainland Primary Care Shalayah Beagley: [name] Other Clinician: Referring Elycia Woodside: [name] Treating Leopold Smyers/Extender: Skipper Cliche in Treatment: 12 Wound Status Wound Number: 1 Primary Etiology: Trauma, Other Wound Location: Left, Plantar Calcaneus Secondary Pressure Ulcer Etiology: Wounding Event: Trauma Wound Status: Open Date Acquired: 10/21/2019 Comorbid Hypertension, Myocardial Infarction, Osteoarthritis, Weeks Of Treatment: 12 History: Neuropathy Clustered Wound: No Photos Wound Measurements Length: (cm) 0.3 Width: (cm) 0.3 Depth: (cm) 0.2 Area: (cm) 0.071 Volume: (cm) 0.014 % Reduction in Area: 63.8% % Reduction in Volume:  82.3% Epithelialization: None Tunneling: No Undermining: No Wound Description Classification: Full Thickness Without Exposed Support Structures Wound Margin: Flat and Intact Exudate Amount: Medium Exudate Type: Serosanguineous Exudate Color: red, brown Foul Odor After Cleansing: No Slough/Fibrino No Wound Bed Granulation Amount: Large (67-100%) Exposed Structure Granulation Quality: Pink, Pale Fascia Exposed: No Necrotic Amount: None Present (0%) Fat Layer (Subcutaneous Tissue) Exposed: Yes Tendon Exposed: No Muscle Exposed: No Joint Exposed: No Bone Exposed: No Treatment Notes Wound #1 (Left, Plantar Calcaneus) 1. Cleansed with: Clean wound with Normal Saline Notes S Cell, heel cup secured with stretch net [name], [name] ([id number]) Electronic Signature(s) Signed: 05/05/2020 3:53:56 PM By: Deon Pilling Entered By: Deon Pilling on 05/05/2020 13:23:22 [place], [name] ([id number]) -------------------------------------------------------------------------------- Wound Assessment Details Patient Name: [name] Date of Service: 05/05/2020 12:45 PM Medical Record Number: [id number] Patient Account Number: [id number] Date of Birth/Sex: 1936-03-04 (84 y.o. M) Treating RN: Stann Mainland Primary Care Waldemar Siegel: [name] Other Clinician: Referring Alixander Rallis: [name] Treating Lurlene Ronda/Extender: Skipper Cliche in Treatment: 12 Wound Status Wound Number: 2 Primary Etiology: Trauma, Other Wound Location: Left, Proximal, Plantar Foot Wound Status: Open Wounding Event: Not Known Date Acquired: 04/22/2020 Weeks Of Treatment: 1 Clustered Wound: No Wound Measurements Length: (cm) Width: (cm) Depth: (cm) Area: (cm) Volume: (cm) 0 % Reduction in Area: 100% 0 % Reduction in Volume: 100% 0 0 0 Wound Description Classification: Full Thickness Without Exposed Support Structu res Electronic Signature(s) Signed: 05/05/2020 3:53:56 PM By: Deon Pilling Entered By: Deon Pilling on 05/05/2020 13:20:37 [place]ers, [name] ([id number]) -------------------------------------------------------------------------------- Wound Assessment Details Patient Name: [name] Date of Service: 05/05/2020 12:45 PM Medical Record Number: [id number] Patient Account Number: [id number] Date of Birth/Sex: 11/19/1935 (84 y.o. M) Treating RN: Stann Mainland Primary Care Candy Leverett: [name] Other Clinician: Referring Shareena Nusz: [name] Treating Hunt Zajicek/Extender: Skipper Cliche in Treatment: 12 Wound Status Wound Number: 3 Primary Abrasion Etiology: Wound Location: Right, Dorsal  Foot Wound Status: Open Wounding Event: Footwear Injury Comorbid Hypertension, Myocardial Infarction, Osteoarthritis, Date Acquired: 04/30/2020 History: Neuropathy Weeks Of Treatment: 0 Clustered Wound: No Photos Wound Measurements Length: (cm) 8.3 Width: (cm) 8 Depth: (cm) 0.1 Area: (cm) 52.15 Volume: (cm) 5.215 % Reduction in Area: 0% % Reduction in Volume: 0% Epithelialization: None Tunneling: No Undermining: No Wound Description Classification: Full Thickness Without Exposed Support Structures Wound Margin: Distinct, outline attached Exudate Amount: Medium Exudate Type: Serosanguineous Exudate Color: red, brown Foul Odor After Cleansing: No Slough/Fibrino No Wound Bed Granulation Amount: Large (67-100%) Exposed Structure Granulation Quality: Red Fascia Exposed: No Necrotic Amount: None Present (0%) Fat Layer (Subcutaneous Tissue) Exposed: Yes Tendon Exposed: No Muscle Exposed: No Joint Exposed: No Bone Exposed: No Treatment Notes Wound #3 (Right, Dorsal Foot) 1. Cleansed with: Clean wound with Normal Saline 3. Peri-wound Care: Moisturizing lotion Bouley, Aeron ([id number]) 4. Dressing Applied: Calcium Alginate with Silver 5. Secondary Dressing Applied Dry Gauze Kerlix/Conform 7. Secured with Architect) Signed: 05/05/2020 3:53:56 PM By: Deon Pilling Entered By: Deon Pilling on 05/05/2020 13:24:11 Juarez, [name] ([id number]) -------------------------------------------------------------------------------- Vitals Details Patient Name: [name] Date of Service: 05/05/2020 12:45 PM Medical Record Number: [id number] Patient Account Number: [id number] Date of Birth/Sex: Oct 19, 1935 (84 y.o. M) Treating RN: Stann Mainland Primary Care Nykerria Macconnell: [name] Other Clinician: Referring Jeslyn Amsler: [name] Treating Orrie Lascano/Extender: Skipper Cliche in Treatment: 12 Vital Signs Time Taken: 13:05 Temperature (F): 98.5 Height (in): 72 Pulse (bpm): 92 Weight (lbs): 200 Respiratory Rate (breaths/min): 16 Body Mass Index (BMI): 27.1 Blood Pressure (mmHg): 135/78 Reference Range: 80 - 120 mg / dl Electronic Signature(s) Signed: 05/05/2020 3:53:56 PM By: Deon Pilling Entered By: Deon Pilling on 05/05/2020 13:19:28

## 2020-05-05 NOTE — Progress Notes (Addendum)
Marcus, Holt ([id number]) Visit Report for 05/05/2020 Chief Complaint Document Details Patient Name: [name], [name] Date of Service: 05/05/2020 12:45 PM Medical Record Number: [id number] Patient Account Number: [id number] Date of Birth/Sex: [birth year] (84 y.o. M) Treating [name]: [name] Primary Care Provider: Nobie Holt Other Clinician: Referring Provider: Nobie Holt Treating Provider/Extender: [name] in Treatment: 12 Information Obtained from: Patient Chief Complaint Pressure ulcer left heel Electronic Signature(s) Signed: 05/05/2020 1:01:54 PM By: Marcus Keeler PA-C Entered By: [name] on 05/05/2020 13:01:53 [name], [name] ([id number]) -------------------------------------------------------------------------------- Debridement Details Patient Name: [name] Date of Service: 05/05/2020 12:45 PM Medical Record Number: [id number] Patient Account Number: [id number] Date of Birth/Sex: 15-Dec-1935 (84 y.o. M) Treating [name]: [name] Primary Care Provider: Nobie Holt Other Clinician: Referring Provider: Nobie Holt Treating Provider/Extender: [name] in Treatment: 12 Debridement Performed for Wound #1 Left,Plantar Calcaneus Assessment: Performed By: Physician Marcus Sams., PA-C Debridement Type: Debridement Level of Consciousness (Pre- Awake and Alert procedure): Pre-procedure Verification/Time Out Yes - 13:15 Taken: Total Area Debrided (L x W): 0.3 (cm) x 0.3 (cm) = 0.09 (cm) Tissue and other material Viable, Non-Viable, Slough, Subcutaneous, Slough debrided: Level: Skin/Subcutaneous Tissue Debridement Description: Excisional Instrument: Curette Bleeding: Minimum Hemostasis Achieved: Pressure Response to Treatment: Procedure was tolerated well Level of Consciousness (Post- Awake and Alert procedure): Post Debridement Measurements of Total Wound Length: (cm) 0.4 Width: (cm) 0.4 Depth: (cm)  0.3 Volume: (cm) 0.038 Character of Wound/Ulcer Post Debridement: Stable Post Procedure Diagnosis Same as Pre-procedure Electronic Signature(s) Signed: 05/05/2020 2:59:38 PM By: [name], BSN, [name], [name], Kim [name], [name] Signed: 05/05/2020 5:21:29 PM By: Marcus Keeler PA-C Entered By: [name], BSN, [name], [name], [name] on 05/05/2020 14:59:38 [name], [name] ([id number]) -------------------------------------------------------------------------------- HPI Details Patient Name: [name] Date of Service: 05/05/2020 12:45 PM Medical Record Number: [id number] Patient Account Number: [id number] Date of Birth/Sex: 10-Oct-1935 (84 y.o. M) Treating [name]: [name] Primary Care Provider: Nobie Holt Other Clinician: Referring Provider: Nobie Holt Treating Provider/Extender: [name] in Treatment: 12 History of Present Illness HPI Description: 02/11/2020 on evaluation today patient appears to be doing somewhat poorly on initial evaluation concerning his heel. He has been using a topical antibiotic ointment which does not seem to really have been doing the best job for him. He tells me that he had a wound that occurred as a result of his padding in the bottom of his shoe wear his foot drop brace goes actually had worn through and his foot was actually rubbing on the bottom of the shoe on the brace itself. Subsequently this ended up with a wound and he in turn had a lot of issues following. Fortunately there is no signs of active infection at this time which is good news in my opinion. With that being said the wound does appear to be somewhat moist with some evidence of maceration unfortunately. He has no pain he does have neuropathy. He also has bilateral foot drop which she wears braces. This does appear to be however a pressure injury secondary to the AFO brace. The patient has been seen by podiatry since May 2021 unfortunately this is not healing hence the reason that he is coming to see [place]  at this point to see if there is anything we can do to help in this regard. 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 9/15; the patient missed his appointment last week due to  illness with his wife who had to be hospitalized. We are using Hydrofera Blue to the small punched out area in the middle of the left plantar heel. 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 This is felt to be pressure from the brace itself. 03/05/2020 upon evaluation today patient appears to be doing well in regard to his wound on the heel. He has been tolerating the dressing changes without complication which is great news. Fortunately there is no signs of active infection which I am very happy with. 03/19/2020 upon evaluation today patient appears to be doing a little worse in regard to his heel based on what I am seeing at this point. There actually appears to be a pocket off to the side laterally that is problematic for him at this point. I am not to clear this way with some sharp debridement currently. The patient was in agreement with that plan. 10/20; patient I do not usually see. He has a small wound on the left plantar heel. Some depth. He is apparently completing antibiotics. He has bilateral foot drop and therefore is AFO braces limiting what we can do to offload this. 04/14/2020 upon evaluation today patient's heel actually appears to be doing fairly well. There is no signs of active infection at this time which is great news and overall I feel like he is managing nicely. The only issue he has is that the individual who has been helping change the dressings at home unfortunately has not been available for the past week or so. She is out of town. Nonetheless he has been going to urgent care to have the dressings changed as he is not able to do this on his own and states that his wife is unable to do it  for him. 11/10; patient in today for a nurse visit but I was asked to look at him. I note that we are treating him for a small punched-out area on the left plantar heel. He arrived with a new superficial area close to this wound I am not really clear of the exact etiology of this. The bigger problem was increased swelling warmth of the lower leg below the knee but intense erythema involving the entire anterior knee and medial thigh. His entire leg is warm and swollen. However it was not completely obvious to me that there was a big change in the lower leg however the nurses and the patient certainly thought there was. The patient stated that after his 2-week of trimethoprim sulfamethoxazole his legs looked very similar. His vital signs are stable he is afebrile but nevertheless if this is all infection this is extensive and failing outpatient management 04/28/2020 on evaluation today patient presents for follow-up after having been seen in the ER and admitted to the hospital due to cellulitis in the left knee. He has continued to have issues at this point with regard to his knee it still very hot to touch upon evaluation today. His heel ulcer is actually measuring smaller than the last time I saw him but he still is having a lot of difficulty with dry and flaky skin over his feet in general. 05/05/2020 on evaluation today patient appears to be doing pretty well in regard to his heel ulcer. This is measuring a little smaller which is good news. Fortunately there is no signs of active infection at this time. No fevers, chills, nausea, vomiting, or diarrhea. He does have a new area on his right dorsal surface of  his foot. With that being said this region we really do not know exactly what happened where this came from it appears to be somewhat abnormal in that there is a very well demarcated line that almost looks as if he could have had a burn or something of the sort that was partially protected 1 area  but got burned over a different region. Either way I think we can probably get this healed fairly quickly. Electronic Signature(s) Signed: 05/05/2020 1:42:55 PM By: Marcus Keeler PA-C Entered By: [name] on 05/05/2020 13:42:54 Borrelli, [name] ([id number]) -------------------------------------------------------------------------------- Physical Exam Details Patient Name: [name] Date of Service: 05/05/2020 12:45 PM Medical Record Number: [id number] Patient Account Number: [id number] Date of Birth/Sex: May 15, 1936 (84 y.o. M) Treating [name]: [name] Primary Care Provider: Nobie Holt Other Clinician: Referring Provider: Nobie Holt Treating Provider/Extender: [name] in Treatment: 66 Constitutional Well-nourished and well-hydrated in no acute distress. Respiratory normal breathing without difficulty. Psychiatric this patient is able to make decisions and demonstrates good insight into disease process. Alert and Oriented x 3. pleasant and cooperative. Notes Upon inspection patient's wound actually showed signs of good granulation epithelization I did perform sharp debridement clear away some of the callus and slough from the surface of the wound on the left heel. The patient tolerated that today without complication post debridement wound bed appears to be doing much better which is great news. Electronic Signature(s) Signed: 05/05/2020 1:43:15 PM By: Marcus Keeler PA-C Entered By: [name] on 05/05/2020 13:43:15 Turi, [name] ([id number]) -------------------------------------------------------------------------------- Physician Orders Details Patient Name: [name] Date of Service: 05/05/2020 12:45 PM Medical Record Number: [id number] Patient Account Number: [id number] Date of Birth/Sex: 11/18/1935 (84 y.o. 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 Wound #3 Right,Dorsal Foot o Topical Lidocaine 4% cream applied to wound bed prior to debridement (In Clinic Only). Skin Barriers/Peri-Wound Care Wound #1 Left,Plantar Calcaneus o Moisturizing lotion Wound #3 Right,Dorsal Foot o Moisturizing lotion Primary Wound Dressing Wound #1 Left,Plantar Calcaneus o Silver Alginate Wound #3 Right,Dorsal Foot o Silver Alginate Secondary Dressing Wound #1 Left,Plantar Calcaneus o Conform/Kerlix - Heel cup secured by stretch net o ABD and Kerlix/Conform Wound #3 Right,Dorsal Foot o ABD and Kerlix/Conform Dressing Change Frequency Wound #1 Left,Plantar Calcaneus o Change dressing every other day. Wound #3 Right,Dorsal Foot o Change dressing every other day. Follow-up Appointments [name], [name] ([id number]) Wound #1 Left,Plantar Calcaneus o Return Appointment in 1 week. Wound #3 Right,Dorsal Foot o Return Appointment in 1 week. Off-Loading Wound #1 Left,Plantar Calcaneus o Other: - keep pressure off of heel Wound #3 Right,Dorsal Foot o Other: - keep pressure off of heel Electronic Signature(s) Signed: 05/05/2020 5:19:05 PM By: [name], BSN, [name], [name], Kim [name], [name] Signed: 05/05/2020 5:21:29  PM By: Marcus Keeler PA-C Entered By: [name], BSN,  [name], [name], [name] on 05/05/2020 13:38:46 [name], [name] ([id number]) -------------------------------------------------------------------------------- Problem List Details Patient Name: BLAYKE, CORDREY Date of Service: 05/05/2020 12:45 PM Medical Record Number: [id number] Patient Account Number: [id number] Date of Birth/Sex: December 22, 1935 (84 y.o. 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/05/2020 No Yes exposed G60.3 Idiopathic progressive neuropathy 02/11/2020 No Yes M21.372 Foot drop, left foot 02/11/2020 No Yes M21.371 Foot drop, right foot 02/11/2020 No Yes L03.116 Cellulitis of left lower limb 04/22/2020 No Yes Inactive Problems Resolved Problems Electronic Signature(s) Signed: 05/05/2020 3:11:36 PM By: Marcus Keeler PA-C Previous Signature: 05/05/2020 1:01:47 PM Version By: Marcus Keeler PA-C Entered By: [name] on 05/05/2020 15:11:36 Nitsch, [name] ([id number]) -------------------------------------------------------------------------------- Progress Note Details Patient Name: [name] Date of Service: 05/05/2020 12:45 PM Medical Record Number: [id number] Patient Account Number: [id number] Date of Birth/Sex: 08-10-1935 (84 y.o. M) Treating [name]: [name] Primary Care Provider: Nobie Holt Other Clinician: Referring Provider: Nobie Holt Treating Provider/Extender: [name] in Treatment: 12 Subjective Chief Complaint Information obtained from Patient Pressure ulcer left heel History of Present Illness (HPI) 02/11/2020 on evaluation  today patient appears to be doing somewhat poorly on initial evaluation concerning his heel. He has been using a topical antibiotic ointment which does not seem to really have been doing the best job for him. He tells me that he had a wound that occurred as a result of his padding in the bottom of his shoe wear his foot drop brace goes actually had worn through and his foot was actually rubbing on the bottom of the shoe on the brace itself. Subsequently this ended up with a wound and he in turn had a lot of issues following. Fortunately there is no signs of active infection at this time which is good news in my opinion. With that being said the wound does appear to be somewhat moist with some evidence of maceration unfortunately. He has no pain he does have neuropathy. He also has bilateral foot drop which she wears braces. This does appear to be however a pressure injury secondary to the AFO brace. The patient has been seen by podiatry since May 2021 unfortunately this is not healing hence the reason that he is coming to see [place] at this point to see if there is anything we can do to help in this regard. 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 9/15; the patient missed his appointment last week due to illness with his wife who had to be hospitalized. We are using Hydrofera Blue to the small punched out area in the middle of the left plantar heel. 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 This is felt to be pressure from the brace itself. 03/05/2020 upon evaluation today patient appears to be doing well in regard to his wound on the heel. He has been tolerating the dressing changes without complication which is great news. Fortunately there is no signs of active infection which I am very happy with. 03/19/2020 upon evaluation today patient appears to be doing a little worse in regard to  his heel based on what I  am seeing at this point. There actually appears to be a pocket off to the side laterally that is problematic for him at this point. I am not to clear this way with some sharp debridement currently. The patient was in agreement with that plan. 10/20; patient I do not usually see. He has a small wound on the left plantar heel. Some depth. He is apparently completing antibiotics. He has bilateral foot drop and therefore is AFO braces limiting what we can do to offload this. 04/14/2020 upon evaluation today patient's heel actually appears to be doing fairly well. There is no signs of active infection at this time which is great news and overall I feel like he is managing nicely. The only issue he has is that the individual who has been helping change the dressings at home unfortunately has not been available for the past week or so. She is out of town. Nonetheless he has been going to urgent care to have the dressings changed as he is not able to do this on his own and states that his wife is unable to do it for him. 11/10; patient in today for a nurse visit but I was asked to look at him. I note that we are treating him for a small punched-out area on the left plantar heel. He arrived with a new superficial area close to this wound I am not really clear of the exact etiology of this. The bigger problem was increased swelling warmth of the lower leg below the knee but intense erythema involving the entire anterior knee and medial thigh. His entire leg is warm and swollen. However it was not completely obvious to me that there was a big change in the lower leg however the nurses and the patient certainly thought there was. The patient stated that after his 2-week of trimethoprim sulfamethoxazole his legs looked very similar. His vital signs are stable he is afebrile but nevertheless if this is all infection this is extensive and failing outpatient management 04/28/2020 on evaluation  today patient presents for follow-up after having been seen in the ER and admitted to the hospital due to cellulitis in the left knee. He has continued to have issues at this point with regard to his knee it still very hot to touch upon evaluation today. His heel ulcer is actually measuring smaller than the last time I saw him but he still is having a lot of difficulty with dry and flaky skin over his feet in general. 05/05/2020 on evaluation today patient appears to be doing pretty well in regard to his heel ulcer. This is measuring a little smaller which is good news. Fortunately there is no signs of active infection at this time. No fevers, chills, nausea, vomiting, or diarrhea. He does have a new area on his right dorsal surface of his foot. With that being said this region we really do not know exactly what happened where this came from it appears to be somewhat abnormal in that there is a very well demarcated line that almost looks as if he could have had a burn or something of the sort that was partially protected 1 area but got burned over a different region. Either way I think we can probably get this healed fairly quickly. Objective Constitutional Well-nourished and well-hydrated in no acute distress. Vitals Time Taken: 1:05 PM, Height: 72 in, Weight: 200 lbs, BMI: 27.1, Temperature: 98.5 F, Pulse: 92 bpm, Respiratory Rate: 16 breaths/min, Blood Pressure: 135/78 mmHg. Mittelman, [name] (  [id number]) Respiratory normal breathing without difficulty. Psychiatric this patient is able to make decisions and demonstrates good insight into disease process. Alert and Oriented x 3. pleasant and cooperative. General Notes: Upon inspection patient's wound actually showed signs of good granulation epithelization I did perform sharp debridement clear away some of the callus and slough from the surface of the wound on the left heel. The patient tolerated that today without complication post debridement wound  bed appears to be doing much better which is great news. Integumentary (Hair, Skin) Wound #1 status is Open. Original cause of wound was Trauma. The wound is located on the Left,Plantar Calcaneus. The wound measures 0.3cm length x 0.3cm width x 0.2cm depth; 0.071cm^2 area and 0.014cm^3 volume. There is Fat Layer (Subcutaneous Tissue) exposed. There is no tunneling or undermining noted. There is a medium amount of serosanguineous drainage noted. The wound margin is flat and intact. There is large (67-100%) pink, pale granulation within the wound bed. There is no necrotic tissue within the wound bed. Wound #2 status is Open. Original cause of wound was Not Known. The wound is located on the Left,Proximal,Plantar Foot. The wound measures 0cm length x 0cm width x 0cm depth; 0cm^2 area and 0cm^3 volume. Wound #3 status is Open. Original cause of wound was Footwear Injury. The wound is located on the Right,Dorsal Foot. The wound measures 8.3cm length x 8cm width x 0.1cm depth; 52.15cm^2 area and 5.215cm^3 volume. There is Fat Layer (Subcutaneous Tissue) exposed. There is no tunneling or undermining noted. There is a medium amount of serosanguineous drainage noted. The wound margin is distinct with the outline attached to the wound base. There is large (67-100%) red granulation within the wound bed. There is no necrotic tissue within the wound bed. 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 There was a Excisional Skin/Subcutaneous Tissue Debridement with a total area of 0.09 sq cm performed by Marcus Sams., PA-C. With the following instrument(s): Curette to remove Viable  and Non-Viable tissue/material. Material removed includes Subcutaneous Tissue and Slough and. No specimens were taken. A time out was conducted at 13:15, prior to the start of the procedure. A Minimum amount of bleeding was controlled with Pressure. The procedure was tolerated well. Post Debridement Measurements: 0.4cm length x 0.4cm width x 0.3cm depth; 0.038cm^3 volume. Character of Wound/Ulcer Post Debridement is stable. Post procedure Diagnosis Wound #1: Same as Pre-Procedure Plan Wound Cleansing: Wound #1 Left,Plantar Calcaneus: Dial antibacterial soap, wash wounds, rinse and pat dry prior to dressing wounds Wound #3 Right,Dorsal Foot: Dial antibacterial soap, wash wounds, rinse and pat dry prior to dressing wounds Anesthetic (add to Medication List): Wound #1 Left,Plantar Calcaneus: Topical Lidocaine 4% cream applied to wound bed prior to debridement (In Clinic Only). Wound #3 Right,Dorsal Foot: Topical Lidocaine 4% cream applied to wound bed prior to debridement (In Clinic Only). [name], [name] ([id number]) Skin Barriers/Peri-Wound Care: Wound #1 Left,Plantar Calcaneus: Moisturizing lotion Wound #3 Right,Dorsal Foot: Moisturizing lotion Primary Wound Dressing: Wound #1 Left,Plantar Calcaneus: Silver Alginate Wound #3 Right,Dorsal Foot: Silver Alginate Secondary Dressing: Wound #1 Left,Plantar Calcaneus: Conform/Kerlix - Heel cup secured by stretch net ABD and Kerlix/Conform Wound #3 Right,Dorsal Foot: ABD and Kerlix/Conform Dressing Change Frequency: Wound #  1 Left,Plantar Calcaneus: Change dressing every other day. Wound #3 Right,Dorsal Foot: Change dressing every other day. Follow-up Appointments: Wound #1 Left,Plantar Calcaneus: Return Appointment in 1 week. Wound #3 Right,Dorsal Foot: Return Appointment in 1 week. Off-Loading: Wound #1 Left,Plantar Calcaneus: Other: - keep pressure off of heel Wound #3 Right,Dorsal Foot: Other: - keep pressure off of heel 1 would  recommend that in regard to the right dorsal foot as well as the left heel we use silver alginate both locations this seems to be doing well. 2. I am also can recommend at this time that we have the patient continue with the offloading foam on the heel in order to try to keep some of the pressure off. He has to wear his braces in his custom shoes which again does try to help with some of the pressure relief as well. 3. Regard to the right foot will be using alginate in this area and hopefully this will heal quite readily I am not really sure what caused the issue but it does appear to be somewhat unusual and that it has very well demarcated edges that almost appear to be contact dermatitis-like in nature. We will see patient back for reevaluation in 1 week here in the clinic. If anything worsens or changes patient will contact our office for additional recommendations. Electronic Signature(s) Signed: 05/05/2020 3:11:48 PM By: Marcus Keeler PA-C Previous Signature: 05/05/2020 3:10:56 PM Version By: Marcus Keeler PA-C Previous Signature: 05/05/2020 1:44:15 PM Version By: Marcus Keeler PA-C Entered By: [name] on 05/05/2020 15:11:48 Tsan, [name] ([id number]) -------------------------------------------------------------------------------- SuperBill Details Patient Name: [name] Date of Service: 05/05/2020 Medical Record Number: [id number] Patient Account Number: [id number] Date of Birth/Sex: February 11, 1936 (84 y.o. 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, left foot M21.371 Foot drop, right  foot L03.116 Cellulitis of left lower limb Facility Procedures CPT4 Code: 95284132 Description: 44010 - DEB SUBQ TISSUE 20 SQ CM/< Modifier: Quantity: 1 CPT4 Code: Description: ICD-10 Diagnosis Description L89.623 Pressure ulcer of left heel, stage 3 Modifier: Quantity: Physician Procedures CPT4 Code: 2725366 Description: 44034 - WC PHYS LEVEL 4 - EST PT Modifier: 25 Quantity: 1 CPT4 Code: Description: ICD-10 Diagnosis Description L89.623 Pressure ulcer of left heel, stage 3 L97.512 Non-pressure chronic ulcer of other part of right foot with fat layer ex 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

## 2020-05-06 DIAGNOSIS — R262 Difficulty in walking, not elsewhere classified: Secondary | ICD-10-CM | POA: Diagnosis not present

## 2020-05-06 DIAGNOSIS — R2681 Unsteadiness on feet: Secondary | ICD-10-CM | POA: Diagnosis not present

## 2020-05-06 DIAGNOSIS — Z9181 History of falling: Secondary | ICD-10-CM | POA: Diagnosis not present

## 2020-05-06 DIAGNOSIS — M21372 Foot drop, left foot: Secondary | ICD-10-CM | POA: Diagnosis not present

## 2020-05-06 DIAGNOSIS — M21371 Foot drop, right foot: Secondary | ICD-10-CM | POA: Diagnosis not present

## 2020-05-06 DIAGNOSIS — R260 Ataxic gait: Secondary | ICD-10-CM | POA: Diagnosis not present

## 2020-05-13 DIAGNOSIS — M21371 Foot drop, right foot: Secondary | ICD-10-CM | POA: Diagnosis not present

## 2020-05-13 DIAGNOSIS — Z9181 History of falling: Secondary | ICD-10-CM | POA: Diagnosis not present

## 2020-05-13 DIAGNOSIS — M21372 Foot drop, left foot: Secondary | ICD-10-CM | POA: Diagnosis not present

## 2020-05-13 DIAGNOSIS — R2681 Unsteadiness on feet: Secondary | ICD-10-CM | POA: Diagnosis not present

## 2020-05-13 DIAGNOSIS — R262 Difficulty in walking, not elsewhere classified: Secondary | ICD-10-CM | POA: Diagnosis not present

## 2020-05-13 DIAGNOSIS — R260 Ataxic gait: Secondary | ICD-10-CM | POA: Diagnosis not present

## 2020-05-14 ENCOUNTER — Other Ambulatory Visit: Payer: Self-pay

## 2020-05-14 ENCOUNTER — Encounter: Payer: Medicare Other | Attending: Physician Assistant | Admitting: Physician Assistant

## 2020-05-14 DIAGNOSIS — Z885 Allergy status to narcotic agent status: Secondary | ICD-10-CM | POA: Diagnosis not present

## 2020-05-14 DIAGNOSIS — M21371 Foot drop, right foot: Secondary | ICD-10-CM | POA: Insufficient documentation

## 2020-05-14 DIAGNOSIS — M21372 Foot drop, left foot: Secondary | ICD-10-CM | POA: Diagnosis not present

## 2020-05-14 DIAGNOSIS — Z91040 Latex allergy status: Secondary | ICD-10-CM | POA: Diagnosis not present

## 2020-05-14 DIAGNOSIS — L97512 Non-pressure chronic ulcer of other part of right foot with fat layer exposed: Secondary | ICD-10-CM | POA: Diagnosis not present

## 2020-05-14 DIAGNOSIS — Z888 Allergy status to other drugs, medicaments and biological substances status: Secondary | ICD-10-CM | POA: Insufficient documentation

## 2020-05-14 DIAGNOSIS — L89623 Pressure ulcer of left heel, stage 3: Secondary | ICD-10-CM | POA: Insufficient documentation

## 2020-05-14 DIAGNOSIS — L97422 Non-pressure chronic ulcer of left heel and midfoot with fat layer exposed: Secondary | ICD-10-CM | POA: Diagnosis not present

## 2020-05-14 DIAGNOSIS — G603 Idiopathic progressive neuropathy: Secondary | ICD-10-CM | POA: Insufficient documentation

## 2020-05-14 NOTE — Progress Notes (Addendum)
[name], [name] ([id number]) Visit Report for 05/14/2020 Chief Complaint Document Details Patient Name: [name], [name] Date of Service: 05/14/2020 3:15 PM Medical Record Number: [id number] Patient Account Number: [id number] Date of Birth/Sex: March 02, 1936 (84 y.o. M) Treating RN: Cornell Barman Primary Care Provider: [name] Other Clinician: Referring Provider: [name] Treating Provider/Extender: Skipper Cliche in Treatment: 13 Information Obtained from: Patient Chief Complaint Pressure ulcer left heel Electronic Signature(s) Signed: 05/14/2020 3:42:06 PM By: Worthy Keeler PA-C Entered By: Worthy Keeler on 05/14/2020 15:42:04 Metzinger, [name] ([id number]) -------------------------------------------------------------------------------- Debridement Details Patient Name: [name] Date of Service: 05/14/2020 3:15 PM Medical Record Number: [id number] Patient Account Number: [id number] Date of Birth/Sex: [birth year] (84 y.o. M) Treating RN: Dolan Amen Primary Care Provider: [name] Other Clinician: Referring Provider: [name] Treating Provider/Extender: Skipper Cliche in Treatment: 13 Debridement Performed for Wound #1 Left,Plantar Calcaneus Assessment: Performed By: Physician Tommie Sams., PA-C Debridement Type: Debridement Level of Consciousness (Pre- Awake and Alert procedure): Pre-procedure Verification/Time Out Yes - 15:47 Taken: Start Time: 15:47 Total Area Debrided (L x W): 0.4 (cm) x 0.4 (cm) = 0.16 (cm) Tissue and other material Viable, Non-Viable, Callus, Subcutaneous debrided: Level: Skin/Subcutaneous Tissue Debridement Description: Excisional Instrument: Curette Bleeding: Minimum Hemostasis Achieved: Pressure End Time: 15:49 Procedural Pain: 0 Post Procedural Pain: 0 Response to Treatment: Procedure was tolerated well Level of Consciousness (Post- Awake and Alert procedure): Post Debridement Measurements of  Total Wound Length: (cm) 0.4 Width: (cm) 0.4 Depth: (cm) 0.5 Volume: (cm) 0.063 Character of Wound/Ulcer Post Debridement: Stable Post Procedure Diagnosis Same as Pre-procedure Electronic Signature(s) Signed: 05/14/2020 4:35:56 PM By: Charlett Nose Signed: 05/14/2020 4:56:30 PM By: Worthy Keeler PA-C Entered By: Georges Mouse, Minus Breeding on 05/14/2020 15:50:09 [name] ([id number]) -------------------------------------------------------------------------------- HPI Details Patient Name: [name] Date of Service: 05/14/2020 3:15 PM Medical Record Number: [id number] Patient Account Number: [id number] Date of Birth/Sex: Oct 21, 1935 (84 y.o. M) Treating RN: Cornell Barman Primary Care Provider: [name] Other Clinician: Referring Provider: [name] Treating Provider/Extender: Skipper Cliche in Treatment: 13 History of Present Illness HPI Description: 02/11/2020 on evaluation today patient appears to be doing somewhat poorly on initial evaluation concerning his heel. He has been using a topical antibiotic ointment which does not seem to really have been doing the best job for him. He tells me that he had a wound that occurred as a result of his padding in the bottom of his shoe wear his foot drop brace goes actually had worn through and his foot was actually rubbing on the bottom of the shoe on the brace itself. Subsequently this ended up with a wound and he in turn had a lot of issues following. Fortunately there is no signs of active infection at this time which is good news in my opinion. With that being said the wound does appear to be somewhat moist with some evidence of maceration unfortunately. He has no pain he does have neuropathy. He also has bilateral foot drop which she wears braces. This does appear to be however a pressure injury secondary to the AFO brace. The patient has been seen by podiatry since May 2021 unfortunately this is not healing  hence the reason that he is coming to see [place] at this point to see if there is anything we can do to help in this regard. 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 9/15; the patient missed  his appointment last week due to illness with his wife who had to be hospitalized. We are using Hydrofera Blue to the small punched out area in the middle of the left plantar heel. 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 This is felt to be pressure from the brace itself. 03/05/2020 upon evaluation today patient appears to be doing well in regard to his wound on the heel. He has been tolerating the dressing changes without complication which is great news. Fortunately there is no signs of active infection which I am very happy with. 03/19/2020 upon evaluation today patient appears to be doing a little worse in regard to his heel based on what I am seeing at this point. There actually appears to be a pocket off to the side laterally that is problematic for him at this point. I am not to clear this way with some sharp debridement currently. The patient was in agreement with that plan. 10/20; patient I do not usually see. He has a small wound on the left plantar heel. Some depth. He is apparently completing antibiotics. He has bilateral foot drop and therefore is AFO braces limiting what we can do to offload this. 04/14/2020 upon evaluation today patient's heel actually appears to be doing fairly well. There is no signs of active infection at this time which is great news and overall I feel like he is managing nicely. The only issue he has is that the individual who has been helping change the dressings at home unfortunately has not been available for the past week or so. She is out of town. Nonetheless he has been going to urgent care to have the dressings changed as he is not able to do this on his own  and states that his wife is unable to do it for him. 11/10; patient in today for a nurse visit but I was asked to look at him. I note that we are treating him for a small punched-out area on the left plantar heel. He arrived with a new superficial area close to this wound I am not really clear of the exact etiology of this. The bigger problem was increased swelling warmth of the lower leg below the knee but intense erythema involving the entire anterior knee and medial thigh. His entire leg is warm and swollen. However it was not completely obvious to me that there was a big change in the lower leg however the nurses and the patient certainly thought there was. The patient stated that after his 2-week of trimethoprim sulfamethoxazole his legs looked very similar. His vital signs are stable he is afebrile but nevertheless if this is all infection this is extensive and failing outpatient management 04/28/2020 on evaluation today patient presents for follow-up after having been seen in the ER and admitted to the hospital due to cellulitis in the left knee. He has continued to have issues at this point with regard to his knee it still very hot to touch upon evaluation today. His heel ulcer is actually measuring smaller than the last time I saw him but he still is having a lot of difficulty with dry and flaky skin over his feet in general. 05/05/2020 on evaluation today patient appears to be doing pretty well in regard to his heel ulcer. This is measuring a little smaller which is good news. Fortunately there is no signs of active infection at this time. No fevers, chills, nausea, vomiting, or diarrhea. He does have a new area  on his right dorsal surface of his foot. With that being said this region we really do not know exactly what happened where this came from it appears to be somewhat abnormal in that there is a very well demarcated line that almost looks as if he could have had a burn or something of  the sort that was partially protected 1 area but got burned over a different region. Either way I think we can probably get this healed fairly quickly. 05/14/2020 on evaluation today patient appears to be doing about the same in regard to his heel ulcer. We may need to try something different to see if this will be beneficial for him. I am thinking of switching him to collagen. Electronic Signature(s) Signed: 05/14/2020 3:57:20 PM By: Worthy Keeler PA-C Entered By: Worthy Keeler on 05/14/2020 15:57:19 Orlich, [name] ([id number]) -------------------------------------------------------------------------------- Physical Exam Details Patient Name: [name] Date of Service: 05/14/2020 3:15 PM Medical Record Number: [id number] Patient Account Number: [id number] Date of Birth/Sex: 01-14-1936 (84 y.o. M) Treating RN: Cornell Barman Primary Care Provider: [name] Other Clinician: Referring Provider: [name] Treating Provider/Extender: Skipper Cliche in Treatment: 14 Constitutional Well-nourished and well-hydrated in no acute distress. Respiratory normal breathing without difficulty. Psychiatric this patient is able to make decisions and demonstrates good insight into disease process. Alert and Oriented x 3. pleasant and cooperative. Notes Patient patient has a lot of dry skin around the edges of his wound and on the ankle and foot region due to the fact of this being wrapped. I think we may want to switch to a border foam dressing and the patient is in agreement with that plan. Subsequently we can is about ordering some of those forms were also can switch to the collagen. I did perform sharp debridement to clear away some of the slough as well as callus around the edges of the wound he tolerated that today without complication. Electronic Signature(s) Signed: 05/14/2020 3:57:46 PM By: Worthy Keeler PA-C Entered By: Worthy Keeler on 05/14/2020 15:57:46 Bruening, [name]  ([id number]) -------------------------------------------------------------------------------- Physician Orders Details Patient Name: [name] Date of Service: 05/14/2020 3:15 PM Medical Record Number: [id number] Patient Account Number: [id number] Date of Birth/Sex: 1935-08-07 (84 y.o. 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 Wound #3 Right,Dorsal Foot o Topical Lidocaine 4% cream applied to wound bed prior to debridement (In Clinic Only). Skin Barriers/Peri-Wound Care Wound #1 Left,Plantar Calcaneus o Moisturizing lotion Wound #3 Right,Dorsal Foot o Moisturizing lotion Primary Wound Dressing Wound #1 Left,Plantar Calcaneus o Silver Collagen Wound #3 Right,Dorsal Foot o Other: - patient can continue using vaseline Secondary Dressing Wound #1 Left,Plantar Calcaneus o Boardered Foam Dressing Dressing Change Frequency Wound #1 Left,Plantar Calcaneus o Other: - every  2 days Follow-up Appointments Wound #1 Left,Plantar Calcaneus o Return Appointment in 1 week. Wound #3  Right,Dorsal Foot o Return Appointment in 1 week. Weight, Bryam ([id number]) Off-Loading Wound #1 Left,Plantar Calcaneus o Other: - keep pressure off of heel Wound #3 Right,Dorsal Foot o Other: - keep pressure off of heel Electronic Signature(s) Signed: 05/14/2020 4:35:56 PM By: Georges Mouse, Minus Breeding Signed: 05/14/2020 4:56:30 PM By: Worthy Keeler PA-C Entered By: Georges Mouse, Minus Breeding on 05/14/2020 15:55:45 Rissler, [name] ([id number]) -------------------------------------------------------------------------------- Problem List Details Patient Name: [name] Date of Service: 05/14/2020 3:15 PM Medical Record Number: [id number] Patient Account Number: [id number] Date of Birth/Sex: [birth year] (84 y.o. 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 02/11/2020 No Yes M21.372 Foot drop, left foot 02/11/2020 No Yes M21.371 Foot drop, right foot 02/11/2020 No Yes L03.116 Cellulitis of left lower limb 04/22/2020 No Yes Inactive Problems Resolved Problems Electronic Signature(s) Signed: 05/14/2020 3:41:58 PM By: Worthy Keeler PA-C Entered By: Worthy Keeler on 05/14/2020 15:41:58 Roseboom, [name] ([id number]) -------------------------------------------------------------------------------- Progress Note Details Patient Name: [name] Date of Service: 05/14/2020 3:15 PM Medical Record Number: [id number] Patient Account Number: [id number] Date of Birth/Sex: March 20, 1936 (84 y.o. M) Treating RN: Cornell Barman Primary Care Provider: [name] Other  Clinician: Referring Provider: [name] Treating Provider/Extender: Skipper Cliche in Treatment: 13 Subjective Chief Complaint Information obtained from Patient Pressure ulcer left heel History of Present Illness (HPI) 02/11/2020 on evaluation today patient appears to be doing somewhat poorly on initial evaluation concerning his heel. He has been using a topical antibiotic ointment which does not seem to really have been doing the best job for him. He tells me that he had a wound that occurred as a result of his padding in the bottom of his shoe wear his foot drop brace goes actually had worn through and his foot was actually rubbing on the bottom of the shoe on the brace itself. Subsequently this ended up with a wound and he in turn had a lot of issues following. Fortunately there is no signs of active infection at this time which is good news in my opinion. With that being said the wound does appear to be somewhat moist with some evidence of maceration unfortunately. He has no pain he does have neuropathy. He also has bilateral foot drop which she wears braces. This does appear to be however a pressure injury secondary to the AFO brace. The patient has been seen by podiatry since May 2021 unfortunately this is not healing hence the reason that he is coming to see [place] at this point to see if there is anything we can do to help in this regard. 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 9/15; the patient missed his appointment last week due to illness with his wife who had to be hospitalized. We are using Hydrofera Blue to the small punched out area in the middle of the left plantar heel. 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 This is felt to be pressure from the brace itself. 03/05/2020 upon evaluation today  patient appears to be doing well in regard to his wound  on the heel. He has been tolerating the dressing changes without complication which is great news. Fortunately there is no signs of active infection which I am very happy with. 03/19/2020 upon evaluation today patient appears to be doing a little worse in regard to his heel based on what I am seeing at this point. There actually appears to be a pocket off to the side laterally that is problematic for him at this point. I am not to clear this way with some sharp debridement currently. The patient was in agreement with that plan. 10/20; patient I do not usually see. He has a small wound on the left plantar heel. Some depth. He is apparently completing antibiotics. He has bilateral foot drop and therefore is AFO braces limiting what we can do to offload this. 04/14/2020 upon evaluation today patient's heel actually appears to be doing fairly well. There is no signs of active infection at this time which is great news and overall I feel like he is managing nicely. The only issue he has is that the individual who has been helping change the dressings at home unfortunately has not been available for the past week or so. She is out of town. Nonetheless he has been going to urgent care to have the dressings changed as he is not able to do this on his own and states that his wife is unable to do it for him. 11/10; patient in today for a nurse visit but I was asked to look at him. I note that we are treating him for a small punched-out area on the left plantar heel. He arrived with a new superficial area close to this wound I am not really clear of the exact etiology of this. The bigger problem was increased swelling warmth of the lower leg below the knee but intense erythema involving the entire anterior knee and medial thigh. His entire leg is warm and swollen. However it was not completely obvious to me that there was a big change in the lower leg however the nurses and the patient certainly thought there was.  The patient stated that after his 2-week of trimethoprim sulfamethoxazole his legs looked very similar. His vital signs are stable he is afebrile but nevertheless if this is all infection this is extensive and failing outpatient management 04/28/2020 on evaluation today patient presents for follow-up after having been seen in the ER and admitted to the hospital due to cellulitis in the left knee. He has continued to have issues at this point with regard to his knee it still very hot to touch upon evaluation today. His heel ulcer is actually measuring smaller than the last time I saw him but he still is having a lot of difficulty with dry and flaky skin over his feet in general. 05/05/2020 on evaluation today patient appears to be doing pretty well in regard to his heel ulcer. This is measuring a little smaller which is good news. Fortunately there is no signs of active infection at this time. No fevers, chills, nausea, vomiting, or diarrhea. He does have a new area on his right dorsal surface of his foot. With that being said this region we really do not know exactly what happened where this came from it appears to be somewhat abnormal in that there is a very well demarcated line that almost looks as if he could have had a burn or something of the sort  that was partially protected 1 area but got burned over a different region. Either way I think we can probably get this healed fairly quickly. 05/14/2020 on evaluation today patient appears to be doing about the same in regard to his heel ulcer. We may need to try something different to see if this will be beneficial for him. I am thinking of switching him to collagen. Objective Constitutional Worm, Mell ([id number]) Well-nourished and well-hydrated in no acute distress. Vitals Time Taken: 3:21 PM, Height: 72 in, Weight: 200 lbs, BMI: 27.1, Temperature: 98.3 F, Pulse: 106 bpm, Respiratory Rate: 20 breaths/min, Blood Pressure: 124/73  mmHg. Respiratory normal breathing without difficulty. Psychiatric this patient is able to make decisions and demonstrates good insight into disease process. Alert and Oriented x 3. pleasant and cooperative. General Notes: Patient patient has a lot of dry skin around the edges of his wound and on the ankle and foot region due to the fact of this being wrapped. I think we may want to switch to a border foam dressing and the patient is in agreement with that plan. Subsequently we can is about ordering some of those forms were also can switch to the collagen. I did perform sharp debridement to clear away some of the slough as well as callus around the edges of the wound he tolerated that today without complication. Integumentary (Hair, Skin) Wound #1 status is Open. Original cause of wound was Trauma. The wound is located on the Left,Plantar Calcaneus. The wound measures 0.3cm length x 0.3cm width x 0.4cm depth; 0.071cm^2 area and 0.028cm^3 volume. There is Fat Layer (Subcutaneous Tissue) exposed. There is no tunneling or undermining noted. There is a medium amount of serosanguineous drainage noted. The wound margin is flat and intact. There is large (67-100%) pink granulation within the wound bed. There is no necrotic tissue within the wound bed. Wound #3 status is Open. Original cause of wound was Footwear Injury. The wound is located on the Right,Dorsal Foot. The wound measures 0.4cm length x 0.5cm width x 0.1cm depth; 0.157cm^2 area and 0.016cm^3 volume. There is Fat Layer (Subcutaneous Tissue) exposed. There is no tunneling or undermining noted. There is a none present amount of drainage noted. The wound margin is distinct with the outline attached to the wound base. There is large (67-100%) red granulation within the wound bed. There is no necrotic tissue within the wound bed. 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 There was a Excisional Skin/Subcutaneous Tissue Debridement with a total area of 0.16 sq cm performed by Tommie Sams., PA-C. With the following instrument(s): Curette to remove Viable and Non-Viable tissue/material. Material removed includes Callus and Subcutaneous Tissue and. A time out was conducted at 15:47, prior to the start of the procedure. A Minimum amount of bleeding was controlled with Pressure. The procedure was tolerated well with a pain level of 0 throughout and a pain level of 0 following the procedure. Post Debridement Measurements: 0.4cm length x 0.4cm width x 0.5cm depth; 0.063cm^3 volume. Character of Wound/Ulcer Post Debridement is stable. Post procedure Diagnosis Wound #1: Same as Pre-Procedure Plan Wound Cleansing: Wound #1 Left,Plantar Calcaneus: Dial antibacterial soap, wash wounds, rinse and pat dry prior to dressing  wounds Wound #3 Right,Dorsal Foot: Dial antibacterial soap, wash wounds, rinse and pat dry prior to dressing wounds Anesthetic (add to Medication List): Wound #1 Left,Plantar Calcaneus: Topical Lidocaine 4% cream applied to wound bed prior to debridement (In Clinic Only). Coomes, [name] ([id number]) Wound #3 Right,Dorsal Foot: Topical Lidocaine 4% cream applied to wound bed prior to debridement (In Clinic Only). Skin Barriers/Peri-Wound Care: Wound #1 Left,Plantar Calcaneus: Moisturizing lotion Wound #3 Right,Dorsal Foot: Moisturizing lotion Primary Wound Dressing: Wound #1 Left,Plantar Calcaneus: Silver Collagen Wound #3 Right,Dorsal Foot: Other: - patient can continue using vaseline Secondary Dressing: Wound #1 Left,Plantar Calcaneus: Boardered Foam Dressing Dressing Change Frequency: Wound  #1 Left,Plantar Calcaneus: Other: - every 2 days Follow-up Appointments: Wound #1 Left,Plantar Calcaneus: Return Appointment in 1 week. Wound #3 Right,Dorsal Foot: Return Appointment in 1 week. Off-Loading: Wound #1 Left,Plantar Calcaneus: Other: - keep pressure off of heel Wound #3 Right,Dorsal Foot: Other: - keep pressure off of heel 1. Would recommend currently that we go ahead and initiate treatment with a continuation of the every other day dressing changes that would then switch to silver collagen for the primary dressing and secondarily will use a border foam dressing. 2. The patient does need to change this every other day. 3. I would like for him to use a good lotion to loosen up his foot and ankle region where the dressing is not to try to help this area as well. We will give a piece of stretch net use over top if he needs it. This is just to hold everything in place. We will see patient back for reevaluation in 1 week here in the clinic. If anything worsens or changes patient will contact our office for additional recommendations. Electronic Signature(s) Signed: 05/14/2020 3:58:26 PM By: Worthy Keeler PA-C Entered By: Worthy Keeler on 05/14/2020 15:58:26 Mcquitty, [name] ([id number]) -------------------------------------------------------------------------------- SuperBill Details Patient Name: [name] Date of Service: 05/14/2020 Medical Record Number: [id number] Patient Account Number: [id number] Date of Birth/Sex: [birth year] (84 y.o. 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, left  foot M21.371 Foot drop, right foot L03.116 Cellulitis of left lower limb Facility Procedures CPT4 Code: 94801655 Description: 37482 - DEB SUBQ TISSUE 20 SQ CM/< Modifier: Quantity: 1 CPT4 Code: Description: ICD-10 Diagnosis Description L97.512 Non-pressure chronic ulcer of other part of right foot with fat layer ex Modifier: posed Quantity: Physician Procedures CPT4 Code: 7078675 Description: 11042 - WC PHYS SUBQ TISS 20 SQ CM Modifier: Quantity: 1 CPT4 Code: Description: ICD-10 Diagnosis Description L97.512 Non-pressure chronic ulcer of other part of right foot with fat layer ex Modifier: posed Quantity: Electronic Signature(s) Signed: 05/14/2020 3:58:35 PM By: Worthy Keeler PA-C Entered By: Worthy Keeler on 05/14/2020 15:58:34

## 2020-05-14 NOTE — Progress Notes (Signed)
ORANGE, HILLIGOSS ([id number]) Visit Report for 05/14/2020 Arrival Information Details Patient Name: [name], [name] Date of Service: 05/14/2020 3:15 PM Medical Record Number: [id number] Patient Account Number: [id number] Date of Birth/Sex: 03/05/1936 (84 y.o. M) Treating RN: Dolan Amen Primary Care Emmerson Shuffield: [name] Other Clinician: Referring Lucilia Yanni: [name] Treating Prentiss Polio/Extender: Skipper Cliche in Treatment: 13 Visit Information History Since Last Visit Pain Present Now: No Patient Arrived: Walker Arrival Time: 15:21 Accompanied By: self Transfer Assistance: None Patient Requires Transmission-Based Precautions: No Patient Has Alerts: No Electronic Signature(s) Signed: 05/14/2020 4:35:56 PM By: Georges Mouse, Minus Breeding Entered By: Georges Mouse, Minus Breeding on 05/14/2020 15:21:41 [name] ([id number]) -------------------------------------------------------------------------------- Encounter Discharge Information Details Patient Name: [name] Date of Service: 05/14/2020 3:15 PM Medical Record Number: [id number] Patient Account Number: [id number] Date of Birth/Sex: 14-Jan-1936 (84 y.o. M) Treating RN: Dolan Amen Primary Care Ronnette Rump: [name] Other Clinician: Referring Abdulloh Ullom: [name] Treating Ashleyann Shoun/Extender: Skipper Cliche in Treatment: 13 Encounter Discharge Information Items Post Procedure Vitals Discharge Condition: Stable Temperature (F): 98.3 Ambulatory Status: Walker Pulse (bpm): 106 Discharge Destination: Home Respiratory Rate (breaths/min): 20 Transportation: Private Auto Blood Pressure (mmHg): 124/73 Accompanied By: self Schedule Follow-up Appointment: Yes Clinical Summary of Care: Electronic Signature(s) Signed: 05/14/2020 4:35:56 PM By: Georges Mouse, Minus Breeding Entered By: Georges Mouse, Minus Breeding on 05/14/2020 16:13:43 [name]  ([id number]) -------------------------------------------------------------------------------- Lower Extremity Assessment Details Patient Name: [name] Date of Service: 05/14/2020 3:15 PM Medical Record Number: [id number] Patient Account Number: [id number] Date of Birth/Sex: 12-Aug-1935 (84 y.o. M) Treating RN: Dolan Amen Primary Care Kamora Vossler: [name] Other Clinician: Referring Johnmichael Melhorn: [name] Treating Madelline Eshbach/Extender: Jeri Cos Weeks in Treatment: 13 Edema Assessment Assessed: Shirlyn Goltz: Yes] Patrice Paradise: Yes] Edema: [Left: No] [Right: No] Vascular Assessment Pulses: Dorsalis Pedis Palpable: [Left:Yes] [Right:Yes] Electronic Signature(s) Signed: 05/14/2020 4:35:56 PM By: Georges Mouse, Minus Breeding Entered By: Georges Mouse, Minus Breeding on 05/14/2020 15:39:48 [name] ([id number]) -------------------------------------------------------------------------------- Multi Wound Chart Details Patient Name: [name] Date of Service: 05/14/2020 3:15 PM Medical Record Number: [id number] Patient Account Number: [id number] Date of Birth/Sex: [birth year] (84 y.o. M) Treating RN: Dolan Amen Primary Care Brandi Armato: [name] Other Clinician: Referring Karesa Maultsby: [name] Treating Annalei Friesz/Extender: Skipper Cliche in Treatment: 13 Vital Signs Height(in): 72 Pulse(bpm): 106 Weight(lbs): 200 Blood Pressure(mmHg): 124/73 Body Mass Index(BMI): 27 Temperature(F): 98.3 Respiratory Rate(breaths/min): 20 Photos: [N/A:N/A] Wound Location: Left, Plantar Calcaneus Right, Dorsal Foot N/A Wounding Event: Trauma Footwear Injury N/A Primary Etiology: Trauma, Other Abrasion N/A Secondary Etiology: Pressure Ulcer N/A N/A Comorbid History: Hypertension, Myocardial Infarction, Hypertension, Myocardial Infarction, N/A Osteoarthritis, Neuropathy Osteoarthritis, Neuropathy Date Acquired: 10/21/2019 04/30/2020 N/A Weeks of Treatment: 13 1 N/A Wound  Status: Open Open N/A Measurements L x W x D (cm) 0.3x0.3x0.4 0.4x0.5x0.1 N/A Area (cm) : 0.071 0.157 N/A Volume (cm) : 0.028 0.016 N/A % Reduction in Area: 63.80% 99.70% N/A % Reduction in Volume: 64.60% 99.70% N/A Classification: Full Thickness Without Exposed Full Thickness Without Exposed N/A Support Structures Support Structures Exudate Amount: Medium None Present N/A Exudate Type: Serosanguineous N/A N/A Exudate Color: red, brown N/A N/A Wound Margin: Flat and Intact Distinct, outline attached N/A Granulation Amount: Large (67-100%) Large (67-100%) N/A Granulation Quality: Pink Red N/A Necrotic Amount: None Present (0%) None Present (0%) N/A Exposed Structures: Fat Layer (Subcutaneous Tissue): Fat Layer (Subcutaneous Tissue): N/A Yes Yes Fascia: No Fascia: No Tendon: No Tendon: No Muscle: No Muscle: No Joint: No Joint: No Bone: No Bone: No Epithelialization: None None N/A Treatment Notes Electronic Signature(s) Signed: 05/14/2020 4:35:56 PM By: Georges Mouse,  Kenia Entered By: Georges Mouse, Minus Breeding on 05/14/2020 15:44:28 [name] ([id number]) -------------------------------------------------------------------------------- Multi-Disciplinary Care Plan Details Patient Name: [name] Date of Service: 05/14/2020 3:15 PM Medical Record Number: [id number] Patient Account Number: [id number] Date of Birth/Sex: 11-07-1935 (84 y.o. M) Treating RN: Dolan Amen Primary Care Victoria Henshaw: [name] Other Clinician: Referring Shawnese Magner: [name] Treating Nonnie Pickney/Extender: Skipper Cliche in Treatment: 13 Active Inactive Necrotic Tissue Nursing Diagnoses: Impaired tissue integrity related to necrotic/devitalized tissue Knowledge deficit related to management of necrotic/devitalized tissue Goals: Necrotic/devitalized tissue will be minimized in the wound bed Date Initiated: 04/14/2020 Target Resolution Date: 05/14/2020 Goal Status:  Active Patient/caregiver will verbalize understanding of reason and process for debridement of necrotic tissue Date Initiated: 04/14/2020 Target Resolution Date: 05/14/2020 Goal Status: Active Interventions: Assess patient pain level pre-, during and post procedure and prior to discharge Provide education on necrotic tissue and debridement process Treatment Activities: Apply topical anesthetic as ordered : 04/14/2020 Excisional debridement : 04/14/2020 Notes: Wound/Skin Impairment Nursing Diagnoses: Impaired tissue integrity Knowledge deficit related to ulceration/compromised skin integrity Goals: Patient/caregiver will verbalize understanding of skin care regimen Date Initiated: 02/11/2020 Date Inactivated: 05/05/2020 Target Resolution Date: 02/24/2020 Goal Status: Met Ulcer/skin breakdown will have a volume reduction of 80% by week 12 Date Initiated: 05/05/2020 Target Resolution Date: 05/05/2020 Goal Status: Active Ulcer/skin breakdown will heal within 14 weeks Date Initiated: 05/05/2020 Target Resolution Date: 05/19/2020 Goal Status: Active Interventions: Assess patient/caregiver ability to obtain necessary supplies Assess patient/caregiver ability to perform ulcer/skin care regimen upon admission and as needed Assess ulceration(s) every visit Provide education on ulcer and skin care Treatment Activities: Skin care regimen initiated : 02/11/2020 Notes: MENASHE, KAFER ([id number]) Electronic Signature(s) Signed: 05/14/2020 4:35:56 PM By: Georges Mouse, Minus Breeding Entered By: Georges Mouse, Minus Breeding on 05/14/2020 16:11:58 [name] ([id number]) -------------------------------------------------------------------------------- Pain Assessment Details Patient Name: [name] Date of Service: 05/14/2020 3:15 PM Medical Record Number: [id number] Patient Account Number: [id number] Date of Birth/Sex: 04-Jul-1935 (84 y.o. M) Treating RN: Dolan Amen Primary Care Tahari Clabaugh: [name] Other Clinician: Referring Ilhan Debenedetto: [name] Treating Shanaya Schneck/Extender: Skipper Cliche in Treatment: 13 Active Problems Location of Pain Severity and Description of Pain Patient Has Paino No Site Locations Rate the pain. Current Pain Level: 0 Pain Management and Medication Current Pain Management: Electronic Signature(s) Signed: 05/14/2020 4:35:56 PM By: Georges Mouse, Minus Breeding Entered By: Georges Mouse, Minus Breeding on 05/14/2020 15:23:55 [name] ([id number]) -------------------------------------------------------------------------------- Patient/Caregiver Education Details Patient Name: [name] Date of Service: 05/14/2020 3:15 PM Medical Record Number: [id number] Patient Account Number: [id number] Date of Birth/Gender: 1936-02-16 (84 y.o. M) Treating RN: Dolan Amen Primary Care Physician: [name] Other Clinician: Referring Physician: [name] Treating Physician/Extender: Skipper Cliche in Treatment: 13 Education Assessment Education Provided To: Patient Education Topics Provided Wound/Skin Impairment: Methods: Explain/Verbal Responses: State content correctly Electronic Signature(s) Signed: 05/14/2020 4:35:56 PM By: Georges Mouse, Minus Breeding Entered By: Georges Mouse, Minus Breeding on 05/14/2020 16:12:24 [name] ([id number]) -------------------------------------------------------------------------------- Wound Assessment Details Patient Name: [name] Date of Service: 05/14/2020 3:15 PM Medical Record Number: [id number] Patient Account Number: [id number] Date of Birth/Sex: [birth year] (84 y.o. M) Treating RN: Dolan Amen Primary Care Timika Muench: [name] Other Clinician: Referring Bradee Common: [name] Treating Neeraj Housand/Extender: Skipper Cliche in Treatment: 13 Wound Status Wound Number: 1 Primary Etiology: Trauma, Other Wound Location: Left, Plantar Calcaneus Secondary Pressure  Ulcer Etiology: Wounding Event: Trauma Wound Status: Open Date Acquired: 10/21/2019 Comorbid Hypertension, Myocardial Infarction, Osteoarthritis, Weeks Of Treatment: 13 History: Neuropathy Clustered Wound: No Photos Wound Measurements Length: (cm) 0.3  Width: (cm) 0.3 Depth: (cm) 0.4 Area: (cm) 0.071 Volume: (cm) 0.028 % Reduction in Area: 63.8% % Reduction in Volume: 64.6% Epithelialization: None Tunneling: No Undermining: No Wound Description Classification: Full Thickness Without Exposed Support Structu Wound Margin: Flat and Intact Exudate Amount: Medium Exudate Type: Serosanguineous Exudate Color: red, brown res Foul Odor After Cleansing: No Slough/Fibrino No Wound Bed Granulation Amount: Large (67-100%) Exposed Structure Granulation Quality: Pink Fascia Exposed: No Necrotic Amount: None Present (0%) Fat Layer (Subcutaneous Tissue) Exposed: Yes Tendon Exposed: No Muscle Exposed: No Joint Exposed: No Bone Exposed: No Treatment Notes Wound #1 (Left, Plantar Calcaneus) Notes prisma, BFD, stretch net to secure Electronic Signature(s) Signed: 05/14/2020 4:35:56 PM By: Marita Snellen, Kentarius ([id number]) Entered By: Georges Mouse, Minus Breeding on 05/14/2020 15:37:11 [name] ([id number]) -------------------------------------------------------------------------------- Wound Assessment Details Patient Name: [name] Date of Service: 05/14/2020 3:15 PM Medical Record Number: [id number] Patient Account Number: [id number] Date of Birth/Sex: 01-18-1936 (84 y.o. M) Treating RN: Dolan Amen Primary Care Pailyn Bellevue: [name] Other Clinician: Referring Sayer Masini: [name] Treating Shanikqua Zarzycki/Extender: Skipper Cliche in Treatment: 13 Wound Status Wound Number: 3 Primary Abrasion Etiology: Wound Location: Right, Dorsal Foot Wound Status: Open Wounding Event: Footwear Injury Comorbid Hypertension, Myocardial Infarction,  Osteoarthritis, Date Acquired: 04/30/2020 History: Neuropathy Weeks Of Treatment: 1 Clustered Wound: No Photos Wound Measurements Length: (cm) 0.4 Width: (cm) 0.5 Depth: (cm) 0.1 Area: (cm) 0.157 Volume: (cm) 0.016 % Reduction in Area: 99.7% % Reduction in Volume: 99.7% Epithelialization: None Tunneling: No Undermining: No Wound Description Classification: Full Thickness Without Exposed Support Structures Wound Margin: Distinct, outline attached Exudate Amount: None Present Foul Odor After Cleansing: No Slough/Fibrino No Wound Bed Granulation Amount: Large (67-100%) Exposed Structure Granulation Quality: Red Fascia Exposed: No Necrotic Amount: None Present (0%) Fat Layer (Subcutaneous Tissue) Exposed: Yes Tendon Exposed: No Muscle Exposed: No Joint Exposed: No Bone Exposed: No Electronic Signature(s) Signed: 05/14/2020 4:35:56 PM By: Georges Mouse, Minus Breeding Entered By: Georges Mouse, Minus Breeding on 05/14/2020 15:38:28 [name] ([id number]) -------------------------------------------------------------------------------- Vitals Details Patient Name: [name] Date of Service: 05/14/2020 3:15 PM Medical Record Number: [id number] Patient Account Number: [id number] Date of Birth/Sex: 1935/08/04 (84 y.o. M) Treating RN: Dolan Amen Primary Care Arne Schlender: [name] Other Clinician: Referring Huck Ashworth: [name] Treating Burwell Bethel/Extender: Skipper Cliche in Treatment: 13 Vital Signs Time Taken: 15:21 Temperature (F): 98.3 Height (in): 72 Pulse (bpm): 106 Weight (lbs): 200 Respiratory Rate (breaths/min): 20 Body Mass Index (BMI): 27.1 Blood Pressure (mmHg): 124/73 Reference Range: 80 - 120 mg / dl Electronic Signature(s) Signed: 05/14/2020 4:35:56 PM By: Georges Mouse, Minus Breeding Entered By: Georges Mouse, Minus Breeding on 05/14/2020 15:23:46

## 2020-05-18 ENCOUNTER — Ambulatory Visit: Payer: Medicare Other | Attending: Internal Medicine

## 2020-05-18 DIAGNOSIS — Z23 Encounter for immunization: Secondary | ICD-10-CM

## 2020-05-18 NOTE — Progress Notes (Signed)
   Covid-19 Vaccination Clinic  Name:  [name]    MRN: [id number] DOB: March 02, 1936  05/18/2020  [name] was observed post Covid-19 immunization for 15 minutes without incident. He was provided with Vaccine Information Sheet and instruction to access the V-Safe system.   [name] was instructed to call 911 with any severe reactions post vaccine: Marland Kitchen Difficulty breathing  . Swelling of face and throat  . A fast heartbeat  . A bad rash all over body  . Dizziness and weakness   Immunizations Administered    No immunizations on file.

## 2020-05-19 DIAGNOSIS — I739 Peripheral vascular disease, unspecified: Secondary | ICD-10-CM | POA: Diagnosis not present

## 2020-05-19 DIAGNOSIS — Z89412 Acquired absence of left great toe: Secondary | ICD-10-CM | POA: Diagnosis not present

## 2020-05-20 DIAGNOSIS — R2681 Unsteadiness on feet: Secondary | ICD-10-CM | POA: Diagnosis not present

## 2020-05-20 DIAGNOSIS — M21371 Foot drop, right foot: Secondary | ICD-10-CM | POA: Diagnosis not present

## 2020-05-20 DIAGNOSIS — R262 Difficulty in walking, not elsewhere classified: Secondary | ICD-10-CM | POA: Diagnosis not present

## 2020-05-20 DIAGNOSIS — Z9181 History of falling: Secondary | ICD-10-CM | POA: Diagnosis not present

## 2020-05-20 DIAGNOSIS — M21372 Foot drop, left foot: Secondary | ICD-10-CM | POA: Diagnosis not present

## 2020-05-20 DIAGNOSIS — R260 Ataxic gait: Secondary | ICD-10-CM | POA: Diagnosis not present

## 2020-05-21 ENCOUNTER — Other Ambulatory Visit: Payer: Self-pay

## 2020-05-21 ENCOUNTER — Encounter: Payer: Medicare Other | Admitting: Physician Assistant

## 2020-05-21 DIAGNOSIS — G603 Idiopathic progressive neuropathy: Secondary | ICD-10-CM | POA: Diagnosis not present

## 2020-05-21 DIAGNOSIS — L89623 Pressure ulcer of left heel, stage 3: Secondary | ICD-10-CM | POA: Diagnosis not present

## 2020-05-21 DIAGNOSIS — Z885 Allergy status to narcotic agent status: Secondary | ICD-10-CM | POA: Diagnosis not present

## 2020-05-21 DIAGNOSIS — L97512 Non-pressure chronic ulcer of other part of right foot with fat layer exposed: Secondary | ICD-10-CM | POA: Diagnosis not present

## 2020-05-21 DIAGNOSIS — M21371 Foot drop, right foot: Secondary | ICD-10-CM | POA: Diagnosis not present

## 2020-05-21 DIAGNOSIS — M21372 Foot drop, left foot: Secondary | ICD-10-CM | POA: Diagnosis not present

## 2020-05-21 NOTE — Progress Notes (Signed)
[name], [name] ([id number]) Visit Report for 05/21/2020 Arrival Information Details Patient Name: [name], [name] Date of Service: 05/21/2020 10:45 AM Medical Record Number: [id number] Patient Account Number: [id number] Date of Birth/Sex: [birth year] (84 y.o. M) Treating RN: Dolan Amen Primary Care Pal Shell: [name] Other Clinician: Referring Karyme Mcconathy: [name] Treating Alvine Mostafa/Extender: Skipper Cliche in Treatment: 14 Visit Information History Since Last Visit Pain Present Now: No Patient Arrived: Walker Arrival Time: 10:56 Accompanied By: self Transfer Assistance: None Patient Identification Verified: Yes Secondary Verification Process Completed: Yes Patient Requires Transmission-Based Precautions: No Patient Has Alerts: No Electronic Signature(s) Signed: 05/21/2020 4:55:46 PM By: Georges Mouse, Minus Breeding Entered By: Georges Mouse, Minus Breeding on 05/21/2020 10:56:51 [name] ([id number]) -------------------------------------------------------------------------------- Clinic Level of Care Assessment Details Patient Name: [name] Date of Service: 05/21/2020 10:45 AM Medical Record Number: [id number] Patient Account Number: [id number] Date of Birth/Sex: 1935/12/21 (84 y.o. M) Treating RN: Dolan Amen Primary Care Dwaine Pringle: [name] Other Clinician: Referring Harjot Zavadil: [name] Treating Jai Bear/Extender: Skipper Cliche in Treatment: 14 Clinic Level of Care Assessment Items TOOL 1 Quantity Score _0  - Use when EandM and Procedure is performed on INITIAL visit 0 ASSESSMENTS - Nursing Assessment / Reassessment _1  - General Physical Exam (combine w/ comprehensive assessment (listed just below) when performed on new 0 pt. evals) _2  - 0 Comprehensive Assessment (HX, ROS, Risk Assessments, Wounds Hx, etc.) ASSESSMENTS - Wound and Skin Assessment / Reassessment _3  - Dermatologic / Skin Assessment (not related to wound area)  0 ASSESSMENTS - Ostomy and/or Continence Assessment and Care _4  - Incontinence Assessment and Management 0 _5  - 0 Ostomy Care Assessment and Management (repouching, etc.) PROCESS - Coordination of Care _6  - Simple Patient / Family Education for ongoing care 0 _7  - 0 Complex (extensive) Patient / Family Education for ongoing care _8  - 0 Staff obtains Programmer, systems, Records, Test Results / Process Orders _9  - 0 Staff telephones HHA, Nursing Homes / Clarify orders / etc _10  - 0 Routine Transfer to another Facility (non-emergent condition) _11  - 0 Routine Hospital Admission (non-emergent condition) _12  - 0 New Admissions / Biomedical engineer / Ordering NPWT, Apligraf, etc. _13  - 0 Emergency Hospital Admission (emergent condition) PROCESS - Special Needs _14  - Pediatric / Minor Patient Management 0 _15  - 0 Isolation Patient Management _16  - 0 Hearing / Language / Visual special needs _17  - 0 Assessment of Community assistance (transportation, D/C planning, etc.) _18  - 0 Additional assistance / Altered mentation _19  - 0 Support Surface(s) Assessment (bed, cushion, seat, etc.) INTERVENTIONS - Miscellaneous _20  - External ear exam 0 _21  - 0 Patient Transfer (multiple staff / Civil Service fast streamer / Similar devices) _22  - 0 Simple Staple / Suture removal (25 or less) _23  - 0 Complex Staple / Suture removal (26 or more) _24  - 0 Hypo/Hyperglycemic Management (do not check if billed separately) _25  - 0 Ankle / Brachial Index (ABI) - do not check if billed separately Has the patient been seen at the hospital within the last three years: Yes Total Score: 0 Level Of Care: ____ [name] ([id number]) Electronic Signature(s) Signed: 05/21/2020 4:55:46 PM By: Georges Mouse, Minus Breeding Entered By: Georges Mouse, Minus Breeding on 05/21/2020 11:30:11 [name] ([id number]) -------------------------------------------------------------------------------- Encounter Discharge Information Details Patient Name: [name] Date of Service: 05/21/2020 10:45 AM Medical Record Number: [id number] Patient Account Number: [id number] Date of Birth/Sex: April 07, 1936 (84 y.o. M) Treating RN: Dolan Amen Primary Care Muhamed Luecke: [name] Other Clinician: Referring Nahla Lukin: [name] Treating Mimie Goering/Extender: Skipper Cliche in Treatment: [phone] Encounter Discharge  Information Items Post Procedure Vitals Discharge Condition: Stable Temperature (F): 98.1 Ambulatory Status: Walker Pulse (bpm): 86 Discharge Destination: Home Respiratory Rate (breaths/min): 20 Transportation: Private Auto Blood Pressure (mmHg): 106/67 Accompanied By: self Schedule Follow-up Appointment: Yes Clinical Summary of Care: Electronic Signature(s) Signed: 05/21/2020 4:55:46 PM By: Georges Mouse, Minus Breeding Entered By: Georges Mouse, Minus Breeding on 05/21/2020 11:32:25 [name] ([id number]) -------------------------------------------------------------------------------- Lower Extremity Assessment Details Patient Name: [name] Date of Service: 05/21/2020 10:45 AM Medical Record Number: [id number] Patient Account Number: [id number] Date of Birth/Sex: Nov 03, 1935 (84 y.o. M) Treating RN: Dolan Amen Primary Care Dalton Molesworth: [name] Other Clinician: Referring Billal Rollo: [name] Treating Emerita Berkemeier/Extender: Jeri Cos Weeks in Treatment: 14 Edema Assessment Assessed: Shirlyn Goltz: Yes] Patrice Paradise: Yes] Edema: [Left: No] [Right: No] Vascular Assessment Pulses: Dorsalis Pedis Palpable: [Left:Yes] [Right:Yes] Electronic Signature(s) Signed: 05/21/2020 4:55:46 PM By: Georges Mouse, Minus Breeding Entered By: Georges Mouse, Minus Breeding on 05/21/2020 11:12:46 Kulzer, [name] ([id number]) -------------------------------------------------------------------------------- Multi Wound Chart Details Patient Name: [name] Date of Service: 05/21/2020 10:45 AM Medical Record Number: [id number] Patient Account Number:  [id number] Date of Birth/Sex: 1936-05-01 (84 y.o. M) Treating RN: Dolan Amen Primary Care Sanvi Ehler: [name] Other Clinician: Referring Romello Hoehn: [name] Treating Lavon Bothwell/Extender: Skipper Cliche in Treatment: 14 Vital Signs Height(in): 41 Pulse(bpm): 56 Weight(lbs): 200 Blood Pressure(mmHg): 106/67 Body Mass Index(BMI): 27 Temperature(F): 98.1 Respiratory Rate(breaths/min): 20 Photos: [N/A:N/A] Wound Location: Left, Plantar Calcaneus Right, Dorsal Foot N/A Wounding Event: Trauma Footwear Injury N/A Primary Etiology: Trauma, Other Abrasion N/A Secondary Etiology: Pressure Ulcer N/A N/A Comorbid History: Hypertension, Myocardial Infarction, Hypertension, Myocardial Infarction, N/A Osteoarthritis, Neuropathy Osteoarthritis, Neuropathy Date Acquired: 10/21/2019 04/30/2020 N/A Weeks of Treatment: 14 2 N/A Wound Status: Open Healed - Epithelialized N/A Measurements L x W x D (cm) 0.4x0.6x0.3 0x0x0 N/A Area (cm) : 0.188 0 N/A Volume (cm) : 0.057 0 N/A % Reduction in Area: 4.10% 100.00% N/A % Reduction in Volume: 27.80% 100.00% N/A Classification: Full Thickness Without Exposed Full Thickness Without Exposed N/A Support Structures Support Structures Exudate Amount: Medium None Present N/A Exudate Type: Serosanguineous N/A N/A Exudate Color: red, brown N/A N/A Wound Margin: Flat and Intact Distinct, outline attached N/A Granulation Amount: Large (67-100%) None Present (0%) N/A Granulation Quality: Red N/A N/A Necrotic Amount: None Present (0%) None Present (0%) N/A Exposed Structures: Fat Layer (Subcutaneous Tissue): Fascia: No N/A Yes Fat Layer (Subcutaneous Tissue): Fascia: No No Tendon: No Tendon: No Muscle: No Muscle: No Joint: No Joint: No Bone: No Bone: No Epithelialization: None Large (67-100%) N/A Debridement: Debridement - Excisional N/A N/A Pre-procedure Verification/Time 11:16 N/A N/A Out Taken: Tissue Debrided: Callus,  Subcutaneous, Slough N/A N/A Level: Skin/Subcutaneous Tissue N/A N/A Debridement Area (sq cm): 0.3 N/A N/A Instrument: Curette N/A N/A Bleeding: Minimum N/A N/A Hemostasis Achieved: Pressure N/A N/A Procedural Pain: 0 N/A N/A Post Procedural Pain: 0 N/A N/A Gottlieb, Rett ([id number]) Debridement Treatment Procedure was tolerated well N/A N/A Response: Post Debridement 0.5x0.6x0.3 N/A N/A Measurements L x W x D (cm) Post Debridement Volume: 0.071 N/A N/A (cm) Procedures Performed: Debridement N/A N/A Treatment Notes Electronic Signature(s) Signed: 05/21/2020 4:55:46 PM By: Georges Mouse, Minus Breeding Entered By: Georges Mouse, Minus Breeding on 05/21/2020 11:21:59 [name] ([id number]) -------------------------------------------------------------------------------- Multi-Disciplinary Care Plan Details Patient Name: [name] Date of Service: 05/21/2020 10:45 AM Medical Record Number: [id number] Patient Account Number: [id number] Date of Birth/Sex: [birth year] (84 y.o. M) Treating RN: Dolan Amen Primary Care Kailo Kosik: [name] Other Clinician: Referring Donica Derouin: [name] Treating Woodson Macha/Extender: Skipper Cliche in Treatment: 14 Active Inactive Necrotic Tissue  Nursing Diagnoses: Impaired tissue integrity related to necrotic/devitalized tissue Knowledge deficit related to management of necrotic/devitalized tissue Goals: Necrotic/devitalized tissue will be minimized in the wound bed Date Initiated: 04/14/2020 Target Resolution Date: 05/14/2020 Goal Status: Active Patient/caregiver will verbalize understanding of reason and process for debridement of necrotic tissue Date Initiated: 04/14/2020 Target Resolution Date: 05/14/2020 Goal Status: Active Interventions: Assess patient pain level pre-, during and post procedure and prior to discharge Provide education on necrotic tissue and debridement process Treatment Activities: Apply topical anesthetic as ordered  : 04/14/2020 Excisional debridement : 04/14/2020 Notes: Wound/Skin Impairment Nursing Diagnoses: Impaired tissue integrity Knowledge deficit related to ulceration/compromised skin integrity Goals: Patient/caregiver will verbalize understanding of skin care regimen Date Initiated: 02/11/2020 Date Inactivated: 05/05/2020 Target Resolution Date: 02/24/2020 Goal Status: Met Ulcer/skin breakdown will have a volume reduction of 80% by week 12 Date Initiated: 05/05/2020 Target Resolution Date: 05/05/2020 Goal Status: Active Ulcer/skin breakdown will heal within 14 weeks Date Initiated: 05/05/2020 Target Resolution Date: 05/19/2020 Goal Status: Active Interventions: Assess patient/caregiver ability to obtain necessary supplies Assess patient/caregiver ability to perform ulcer/skin care regimen upon admission and as needed Assess ulceration(s) every visit Provide education on ulcer and skin care Treatment Activities: Skin care regimen initiated : 02/11/2020 Notes: AUNDRA, ESPIN ([id number]) Electronic Signature(s) Signed: 05/21/2020 4:55:46 PM By: Georges Mouse, Minus Breeding Entered By: Georges Mouse, Minus Breeding on 05/21/2020 11:15:02 [name] ([id number]) -------------------------------------------------------------------------------- Pain Assessment Details Patient Name: [name] Date of Service: 05/21/2020 10:45 AM Medical Record Number: [id number] Patient Account Number: [id number] Date of Birth/Sex: 1935-11-10 (84 y.o. M) Treating RN: Dolan Amen Primary Care Neriyah Cercone: [name] Other Clinician: Referring Montray Kliebert: [name] Treating Dalylah Ramey/Extender: Skipper Cliche in Treatment: 14 Active Problems Location of Pain Severity and Description of Pain Patient Has Paino No Site Locations Rate the pain. Current Pain Level: 0 Pain Management and Medication Current Pain Management: Electronic Signature(s) Signed: 05/21/2020 4:55:46 PM By: Georges Mouse,  Minus Breeding Entered By: Georges Mouse, Minus Breeding on 05/21/2020 10:58:37 [name] ([id number]) -------------------------------------------------------------------------------- Patient/Caregiver Education Details Patient Name: [name] Date of Service: 05/21/2020 10:45 AM Medical Record Number: [id number] Patient Account Number: [id number] Date of Birth/Gender: June 22, 1935 (84 y.o. M) Treating RN: Dolan Amen Primary Care Physician: [name] Other Clinician: Referring Physician: [name] Treating Physician/Extender: Skipper Cliche in Treatment: 14 Education Assessment Education Provided To: Patient Education Topics Provided Wound/Skin Impairment: Methods: Explain/Verbal Responses: State content correctly Electronic Signature(s) Signed: 05/21/2020 4:55:46 PM By: Georges Mouse, Minus Breeding Entered By: Georges Mouse, Minus Breeding on 05/21/2020 11:30:35 [name] ([id number]) -------------------------------------------------------------------------------- Wound Assessment Details Patient Name: [name] Date of Service: 05/21/2020 10:45 AM Medical Record Number: [id number] Patient Account Number: [id number] Date of Birth/Sex: 08/04/1935 (84 y.o. M) Treating RN: Dolan Amen Primary Care Verina Galeno: [name] Other Clinician: Referring Kate Sweetman: [name] Treating Loyalty Arentz/Extender: Skipper Cliche in Treatment: 14 Wound Status Wound Number: 1 Primary Etiology: Trauma, Other Wound Location: Left, Plantar Calcaneus Secondary Pressure Ulcer Etiology: Wounding Event: Trauma Wound Status: Open Date Acquired: 10/21/2019 Comorbid Hypertension, Myocardial Infarction, Osteoarthritis, Weeks Of Treatment: 14 History: Neuropathy Clustered Wound: No Photos Wound Measurements Length: (cm) 0.4 Width: (cm) 0.6 Depth: (cm) 0.3 Area: (cm) 0.188 Volume: (cm) 0.057 % Reduction in Area: 4.1% % Reduction in Volume: 27.8% Epithelialization:  None Tunneling: No Undermining: No Wound Description Classification: Full Thickness Without Exposed Support Structures Wound Margin: Flat and Intact Exudate Amount: Medium Exudate Type: Serosanguineous Exudate Color: red, brown Foul Odor After Cleansing: No Slough/Fibrino No Wound Bed Granulation Amount: Large (67-100%) Exposed Structure Granulation Quality:  Red Fascia Exposed: No Necrotic Amount: None Present (0%) Fat Layer (Subcutaneous Tissue) Exposed: Yes Tendon Exposed: No Muscle Exposed: No Joint Exposed: No Bone Exposed: No Treatment Notes Wound #1 (Left, Plantar Calcaneus) Notes endoform moisten with saline, BFD Electronic Signature(s) Signed: 05/21/2020 4:55:46 PM By: Marita Snellen, Ripley ([id number]) Entered By: Georges Mouse, Minus Breeding on 05/21/2020 11:08:58 [name] ([id number]) -------------------------------------------------------------------------------- Wound Assessment Details Patient Name: [name] Date of Service: 05/21/2020 10:45 AM Medical Record Number: [id number] Patient Account Number: [id number] Date of Birth/Sex: [birth year] (84 y.o. M) Treating RN: Dolan Amen Primary Care Willam Munford: [name] Other Clinician: Referring Klohe Lovering: [name] Treating Sufyaan Palma/Extender: Skipper Cliche in Treatment: 14 Wound Status Wound Number: 3 Primary Abrasion Etiology: Wound Location: Right, Dorsal Foot Wound Status: Healed - Epithelialized Wounding Event: Footwear Injury Comorbid Hypertension, Myocardial Infarction, Osteoarthritis, Date Acquired: 04/30/2020 History: Neuropathy Weeks Of Treatment: 2 Clustered Wound: No Photos Wound Measurements Length: (cm) 0 Width: (cm) 0 Depth: (cm) 0 Area: (cm) 0 Volume: (cm) 0 % Reduction in Area: 100% % Reduction in Volume: 100% Epithelialization: Large (67-100%) Tunneling: No Undermining: No Wound Description Classification: Full Thickness Without Exposed  Support Structures Wound Margin: Distinct, outline attached Exudate Amount: None Present Foul Odor After Cleansing: No Slough/Fibrino No Wound Bed Granulation Amount: None Present (0%) Exposed Structure Necrotic Amount: None Present (0%) Fascia Exposed: No Fat Layer (Subcutaneous Tissue) Exposed: No Tendon Exposed: No Muscle Exposed: No Joint Exposed: No Bone Exposed: No Electronic Signature(s) Signed: 05/21/2020 4:55:46 PM By: Georges Mouse, Minus Breeding Entered By: Georges Mouse, Minus Breeding on 05/21/2020 11:21:47 [name] ([id number]) -------------------------------------------------------------------------------- [place] Details Patient Name: [name] Date of Service: 05/21/2020 10:45 AM Medical Record Number: [id number] Patient Account Number: [id number] Date of Birth/Sex: [birth year] (84 y.o. M) Treating RN: Dolan Amen Primary Care Mirai Greenwood: [name] Other Clinician: Referring Shannon Kirkendall: [name] Treating Koralee Wedeking/Extender: Skipper Cliche in Treatment: 14 Vital Signs Time Taken: 10:56 Temperature (F): 98.1 Height (in): 72 Pulse (bpm): 86 Weight (lbs): 200 Respiratory Rate (breaths/min): 20 Body Mass Index (BMI): 27.1 Blood Pressure (mmHg): 106/67 Reference Range: 80 - 120 mg / dl Electronic Signature(s) Signed: 05/21/2020 4:55:46 PM By: Georges Mouse, Minus Breeding Entered By: Georges Mouse, Minus Breeding on 05/21/2020 10:58:19

## 2020-05-21 NOTE — Progress Notes (Addendum)
[name], [name] ([id number]) Visit Report for 05/21/2020 Chief Complaint Document Details Patient Name: [name], [name] Date of Service: 05/21/2020 10:45 AM Medical Record Number: [id number] Patient Account Number: [id number] Date of Birth/Sex: 12/26/1935 (84 y.o. M) Treating RN: Cornell Barman Primary Care Provider: [name] Other Clinician: Referring Provider: [name] Treating Provider/Extender: Skipper Cliche in Treatment: 14 Information Obtained from: Patient Chief Complaint Pressure ulcer left heel Electronic Signature(s) Signed: 05/21/2020 11:06:35 AM By: Worthy Keeler PA-C Entered By: Worthy Keeler on 05/21/2020 11:06:35 [name], [name] ([id number]) -------------------------------------------------------------------------------- Debridement Details Patient Name: [name] Date of Service: 05/21/2020 10:45 AM Medical Record Number: [id number] Patient Account Number: [id number] Date of Birth/Sex: 09/09/1935 (84 y.o. M) Treating RN: Dolan Amen Primary Care Provider: [name] Other Clinician: Referring Provider: [name] Treating Provider/Extender: Skipper Cliche in Treatment: 14 Debridement Performed for Wound #1 Left,Plantar Calcaneus Assessment: Performed By: Physician Tommie Sams., PA-C Debridement Type: Debridement Level of Consciousness (Pre- Awake and Alert procedure): Pre-procedure Verification/Time Out Yes - 11:16 Taken: Start Time: 11:16 Total Area Debrided (L x W): 0.5 (cm) x 0.6 (cm) = 0.3 (cm) Tissue and other material Viable, Non-Viable, Callus, Slough, Subcutaneous, Slough debrided: Level: Skin/Subcutaneous Tissue Debridement Description: Excisional Instrument: Curette Bleeding: Minimum Hemostasis Achieved: Pressure Procedural Pain: 0 Post Procedural Pain: 0 Response to Treatment: Procedure was tolerated well Level of Consciousness (Post- Awake and Alert procedure): Post Debridement Measurements  of Total Wound Length: (cm) 0.5 Width: (cm) 0.6 Depth: (cm) 0.3 Volume: (cm) 0.071 Character of Wound/Ulcer Post Debridement: Stable Post Procedure Diagnosis Same as Pre-procedure Electronic Signature(s) Signed: 05/21/2020 4:15:39 PM By: Worthy Keeler PA-C Signed: 05/21/2020 4:55:46 PM By: Georges Mouse, Minus Breeding Entered By: Georges Mouse, Minus Breeding on 05/21/2020 11:20:24 [name] ([id number]) -------------------------------------------------------------------------------- HPI Details Patient Name: [name] Date of Service: 05/21/2020 10:45 AM Medical Record Number: [id number] Patient Account Number: [id number] Date of Birth/Sex: [birth year] (84 y.o. M) Treating RN: Cornell Barman Primary Care Provider: [name] Other Clinician: Referring Provider: [name] Treating Provider/Extender: Skipper Cliche in Treatment: 14 History of Present Illness HPI Description: 02/11/2020 on evaluation today patient appears to be doing somewhat poorly on initial evaluation concerning his heel. He has been using a topical antibiotic ointment which does not seem to really have been doing the best job for him. He tells me that he had a wound that occurred as a result of his padding in the bottom of his shoe wear his foot drop brace goes actually had worn through and his foot was actually rubbing on the bottom of the shoe on the brace itself. Subsequently this ended up with a wound and he in turn had a lot of issues following. Fortunately there is no signs of active infection at this time which is good news in my opinion. With that being said the wound does appear to be somewhat moist with some evidence of maceration unfortunately. He has no pain he does have neuropathy. He also has bilateral foot drop which she wears braces. This does appear to be however a pressure injury secondary to the AFO brace. The patient has been seen by podiatry since May 2021 unfortunately this is not  healing hence the reason that he is coming to see [place] at this point to see if there is anything we can do to help in this regard. 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 9/15; the patient missed his  appointment last week due to illness with his wife who had to be hospitalized. We are using Hydrofera Blue to the small punched out area in the middle of the left plantar heel. 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 This is felt to be pressure from the brace itself. 03/05/2020 upon evaluation today patient appears to be doing well in regard to his wound on the heel. He has been tolerating the dressing changes without complication which is great news. Fortunately there is no signs of active infection which I am very happy with. 03/19/2020 upon evaluation today patient appears to be doing a little worse in regard to his heel based on what I am seeing at this point. There actually appears to be a pocket off to the side laterally that is problematic for him at this point. I am not to clear this way with some sharp debridement currently. The patient was in agreement with that plan. 10/20; patient I do not usually see. He has a small wound on the left plantar heel. Some depth. He is apparently completing antibiotics. He has bilateral foot drop and therefore is AFO braces limiting what we can do to offload this. 04/14/2020 upon evaluation today patient's heel actually appears to be doing fairly well. There is no signs of active infection at this time which is great news and overall I feel like he is managing nicely. The only issue he has is that the individual who has been helping change the dressings at home unfortunately has not been available for the past week or so. She is out of town. Nonetheless he has been going to urgent care to have the dressings changed as he is not able to do this on  his own and states that his wife is unable to do it for him. 11/10; patient in today for a nurse visit but I was asked to look at him. I note that we are treating him for a small punched-out area on the left plantar heel. He arrived with a new superficial area close to this wound I am not really clear of the exact etiology of this. The bigger problem was increased swelling warmth of the lower leg below the knee but intense erythema involving the entire anterior knee and medial thigh. His entire leg is warm and swollen. However it was not completely obvious to me that there was a big change in the lower leg however the nurses and the patient certainly thought there was. The patient stated that after his 2-week of trimethoprim sulfamethoxazole his legs looked very similar. His vital signs are stable he is afebrile but nevertheless if this is all infection this is extensive and failing outpatient management 04/28/2020 on evaluation today patient presents for follow-up after having been seen in the ER and admitted to the hospital due to cellulitis in the left knee. He has continued to have issues at this point with regard to his knee it still very hot to touch upon evaluation today. His heel ulcer is actually measuring smaller than the last time I saw him but he still is having a lot of difficulty with dry and flaky skin over his feet in general. 05/05/2020 on evaluation today patient appears to be doing pretty well in regard to his heel ulcer. This is measuring a little smaller which is good news. Fortunately there is no signs of active infection at this time. No fevers, chills, nausea, vomiting, or diarrhea. He does have a new area on  his right dorsal surface of his foot. With that being said this region we really do not know exactly what happened where this came from it appears to be somewhat abnormal in that there is a very well demarcated line that almost looks as if he could have had a burn or  something of the sort that was partially protected 1 area but got burned over a different region. Either way I think we can probably get this healed fairly quickly. 05/14/2020 on evaluation today patient appears to be doing about the same in regard to his heel ulcer. We may need to try something different to see if this will be beneficial for him. I am thinking of switching him to collagen. 05/21/2020 on evaluation today patient appears to be doing excellent in regard to his foot ulcer from the standpoint of the overall appearance with that being said were just having some trouble getting it to granulate in. I do believe he would benefit from potentially utilizing a skin substitute though at this point in time at the end of the year I think it may be best to hold off till next year if we need to do this but I am going to switch things out a little bit regular try endoform to see if this will be beneficial for him. Electronic Signature(s) Signed: 05/21/2020 11:30:02 AM By: Worthy Keeler PA-C Entered By: Worthy Keeler on 05/21/2020 11:30:02 [name] ([id number]) -------------------------------------------------------------------------------- Physical Exam Details Patient Name: [name] Date of Service: 05/21/2020 10:45 AM Medical Record Number: [id number] Patient Account Number: [id number] Date of Birth/Sex: 11/20/1935 (84 y.o. M) Treating RN: Cornell Barman Primary Care Provider: [name] Other Clinician: Referring Provider: [name] Treating Provider/Extender: Skipper Cliche in Treatment: 63 Constitutional Well-nourished and well-hydrated in no acute distress. Respiratory normal breathing without difficulty. Psychiatric this patient is able to make decisions and demonstrates good insight into disease process. Alert and Oriented x 3. pleasant and cooperative. Notes Wound bed currently did require some mild sharp debridement to remove callus around the edges of  the wound as well as some slough and biofilm from the surface of the wound. He tolerated that today without complication post debridement wound bed appears to be doing much better which is great news. Electronic Signature(s) Signed: 05/21/2020 11:30:16 AM By: Worthy Keeler PA-C Entered By: Worthy Keeler on 05/21/2020 11:30:16 [name] ([id number]) -------------------------------------------------------------------------------- Physician Orders Details Patient Name: [name] Date of Service: 05/21/2020 10:45 AM Medical Record Number: [id number] Patient Account Number: [id number] Date of Birth/Sex: 07-31-1935 (84 y.o. 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 Skin Barriers/Peri-Wound Care Wound #1 Left,Plantar Calcaneus o Moisturizing lotion Primary Wound Dressing Wound #1 Left,Plantar Calcaneus o Other: - endoform moisten with saline Secondary Dressing Wound #1 Left,Plantar Calcaneus o Boardered Foam Dressing Dressing Change Frequency Wound #1 Left,Plantar Calcaneus o Other: - every 2 days Follow-up Appointments Wound #1 Left,Plantar Calcaneus o Return Appointment in 1 week. Off-Loading  Wound #1 Left,Plantar Calcaneus o Other: - keep pressure off of heel Electronic Signature(s) Signed: 05/21/2020 4:15:39 PM  By: Worthy Keeler PA-C Signed: 05/21/2020 4:55:46 PM By: Georges Mouse, Minus Breeding Entered By: Georges Mouse, Minus Breeding on 05/21/2020 11:29:38 [name] ([id number]) -------------------------------------------------------------------------------- Problem List Details Patient Name: [name] Date of Service: 05/21/2020 10:45 AM Medical Record Number: [id number] Patient Account Number: [id number] Date of Birth/Sex: April 13, 1936 (84 y.o. 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 02/11/2020 No Yes M21.372 Foot drop, left foot 02/11/2020 No Yes M21.371 Foot drop, right foot 02/11/2020 No Yes L03.116 Cellulitis of left lower limb 04/22/2020 No Yes Inactive Problems Resolved Problems Electronic Signature(s) Signed: 05/21/2020 11:06:26 AM By: Worthy Keeler PA-C Entered By: Worthy Keeler on 05/21/2020 11:06:26 Crockett, [name] ([id number]) -------------------------------------------------------------------------------- Progress Note Details Patient Name: [name] Date of Service: 05/21/2020 10:45 AM Medical Record Number: [id number] Patient Account Number: [id number] Date of Birth/Sex: 1936/04/04 (84 y.o. M) Treating RN: Cornell Barman Primary Care Provider: [name] Other Clinician: Referring Provider: [name] Treating Provider/Extender: Skipper Cliche in Treatment: 14 Subjective Chief Complaint Information obtained from Patient Pressure ulcer left heel History of Present Illness (HPI) 02/11/2020 on evaluation today patient appears to be  doing somewhat poorly on initial evaluation concerning his heel. He has been using a topical antibiotic ointment which does not seem to really have been doing the best job for him. He tells me that he had a wound that occurred as a result of his padding in the bottom of his shoe wear his foot drop brace goes actually had worn through and his foot was actually rubbing on the bottom of the shoe on the brace itself. Subsequently this ended up with a wound and he in turn had a lot of issues following. Fortunately there is no signs of active infection at this time which is good news in my opinion. With that being said the wound does appear to be somewhat moist with some evidence of maceration unfortunately. He has no pain he does have neuropathy. He also has bilateral foot drop which she wears braces. This does appear to be however a pressure injury secondary to the AFO brace. The patient has been seen by podiatry since May 2021 unfortunately this is not healing hence the reason that he is coming to see [place] at this point to see if there is anything we can do to help in this regard. 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 9/15; the patient missed his appointment last week due to illness with his wife who had to be hospitalized. We are using Hydrofera Blue to the small punched out area in the middle of the left plantar heel. 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 This is felt to be pressure from the brace itself. 03/05/2020 upon evaluation today patient appears to be doing well in regard to his wound on the heel. He has been tolerating the dressing changes without complication which is great news. Fortunately there is no signs of active infection which I am very happy  with. 03/19/2020 upon evaluation today patient appears to be doing a little worse in regard to his heel based on what I am  seeing at this point. There actually appears to be a pocket off to the side laterally that is problematic for him at this point. I am not to clear this way with some sharp debridement currently. The patient was in agreement with that plan. 10/20; patient I do not usually see. He has a small wound on the left plantar heel. Some depth. He is apparently completing antibiotics. He has bilateral foot drop and therefore is AFO braces limiting what we can do to offload this. 04/14/2020 upon evaluation today patient's heel actually appears to be doing fairly well. There is no signs of active infection at this time which is great news and overall I feel like he is managing nicely. The only issue he has is that the individual who has been helping change the dressings at home unfortunately has not been available for the past week or so. She is out of town. Nonetheless he has been going to urgent care to have the dressings changed as he is not able to do this on his own and states that his wife is unable to do it for him. 11/10; patient in today for a nurse visit but I was asked to look at him. I note that we are treating him for a small punched-out area on the left plantar heel. He arrived with a new superficial area close to this wound I am not really clear of the exact etiology of this. The bigger problem was increased swelling warmth of the lower leg below the knee but intense erythema involving the entire anterior knee and medial thigh. His entire leg is warm and swollen. However it was not completely obvious to me that there was a big change in the lower leg however the nurses and the patient certainly thought there was. The patient stated that after his 2-week of trimethoprim sulfamethoxazole his legs looked very similar. His vital signs are stable he is afebrile but nevertheless if this is all infection this is extensive and failing outpatient management 04/28/2020 on evaluation today patient presents for  follow-up after having been seen in the ER and admitted to the hospital due to cellulitis in the left knee. He has continued to have issues at this point with regard to his knee it still very hot to touch upon evaluation today. His heel ulcer is actually measuring smaller than the last time I saw him but he still is having a lot of difficulty with dry and flaky skin over his feet in general. 05/05/2020 on evaluation today patient appears to be doing pretty well in regard to his heel ulcer. This is measuring a little smaller which is good news. Fortunately there is no signs of active infection at this time. No fevers, chills, nausea, vomiting, or diarrhea. He does have a new area on his right dorsal surface of his foot. With that being said this region we really do not know exactly what happened where this came from it appears to be somewhat abnormal in that there is a very well demarcated line that almost looks as if he could have had a burn or something of the sort that was partially protected 1 area but got burned over a different region. Either way I think we can probably get this healed fairly quickly. 05/14/2020 on evaluation today patient appears to be doing about the same in regard to  his heel ulcer. We may need to try something different to see if this will be beneficial for him. I am thinking of switching him to collagen. 05/21/2020 on evaluation today patient appears to be doing excellent in regard to his foot ulcer from the standpoint of the overall appearance with that being said were just having some trouble getting it to granulate in. I do believe he would benefit from potentially utilizing a skin substitute though at this point in time at the end of the year I think it may be best to hold off till next year if we need to do this but I am going to switch things out a little bit regular try endoform to see if this will be beneficial for him. [name], [name]  ([id number]) Objective Constitutional Well-nourished and well-hydrated in no acute distress. Vitals Time Taken: 10:56 AM, Height: 72 in, Weight: 200 lbs, BMI: 27.1, Temperature: 98.1 F, Pulse: 86 bpm, Respiratory Rate: 20 breaths/min, Blood Pressure: 106/67 mmHg. Respiratory normal breathing without difficulty. Psychiatric this patient is able to make decisions and demonstrates good insight into disease process. Alert and Oriented x 3. pleasant and cooperative. General Notes: Wound bed currently did require some mild sharp debridement to remove callus around the edges of the wound as well as some slough and biofilm from the surface of the wound. He tolerated that today without complication post debridement wound bed appears to be doing much better which is great news. Integumentary (Hair, Skin) Wound #1 status is Open. Original cause of wound was Trauma. The wound is located on the Left,Plantar Calcaneus. The wound measures 0.4cm length x 0.6cm width x 0.3cm depth; 0.188cm^2 area and 0.057cm^3 volume. There is Fat Layer (Subcutaneous Tissue) exposed. There is no tunneling or undermining noted. There is a medium amount of serosanguineous drainage noted. The wound margin is flat and intact. There is large (67-100%) red granulation within the wound bed. There is no necrotic tissue within the wound bed. Wound #3 status is Healed - Epithelialized. Original cause of wound was Footwear Injury. The wound is located on the Right,Dorsal Foot. The wound measures 0cm length x 0cm width x 0cm depth; 0cm^2 area and 0cm^3 volume. There is no tunneling or undermining noted. There is a none present amount of drainage noted. The wound margin is distinct with the outline attached to the wound base. There is no granulation within the wound bed. There is no necrotic tissue within the wound bed. 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 There was a Excisional Skin/Subcutaneous Tissue Debridement with a total area of 0.3 sq cm performed by Tommie Sams., PA-C. With the following instrument(s): Curette to remove Viable and Non-Viable tissue/material. Material removed includes Callus, Subcutaneous Tissue, and Slough. A time out was conducted at 11:16, prior to the start of the procedure. A Minimum amount of bleeding was controlled with Pressure. The procedure was tolerated well with a pain level of 0 throughout and a pain level of 0 following the procedure. Post Debridement Measurements: 0.5cm length x 0.6cm width x 0.3cm depth; 0.071cm^3 volume. Character of Wound/Ulcer Post Debridement is stable. Post procedure Diagnosis Wound #1: Same as Pre-Procedure Plan Anesthetic (add to Medication List): Wound #1  Left,Plantar Calcaneus: Topical Lidocaine 4% cream applied to wound bed prior to debridement (In Clinic Only). Skin Barriers/Peri-Wound Care: AVRIAN, DELFAVERO ([id number]) Wound #1 Left,Plantar Calcaneus: Moisturizing lotion Primary Wound Dressing: Wound #1 Left,Plantar Calcaneus: Other: - endoform moisten with saline Secondary Dressing: Wound #1 Left,Plantar Calcaneus: Boardered Foam Dressing Dressing Change Frequency: Wound #1 Left,Plantar Calcaneus: Other: - every 2 days Follow-up Appointments: Wound #1 Left,Plantar Calcaneus: Return Appointment in 1 week. Off-Loading: Wound #1 Left,Plantar Calcaneus: Other: - keep pressure off of heel 1. Would recommend currently that we go ahead and continue with the wound care measures as before and the patient is in agreement with that plan. 2. I am also can recommend at this time that the patient continue to monitor for any  signs of worsening infection he does have a friend change in this for him she seems to be doing a great job in my opinion. 3. I am also can recommend the patient continue to as much as possible to keep pressure off of the heel I think this is of utmost importance. We will see patient back for reevaluation in 1 week here in the clinic. If anything worsens or changes patient will contact our office for additional recommendations. Electronic Signature(s) Signed: 05/21/2020 12:28:35 PM By: Worthy Keeler PA-C Entered By: Worthy Keeler on 05/21/2020 12:28:35 [name] ([id number]) -------------------------------------------------------------------------------- SuperBill Details Patient Name: [name] Date of Service: 05/21/2020 Medical Record Number: [id number] Patient Account Number: [id number] Date of Birth/Sex: [birth year] (84 y.o. 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, left foot M21.371 Foot drop, right foot L03.116 Cellulitis of left lower limb Facility Procedures CPT4 Code: [id number] Description: 93570 - DEB SUBQ TISSUE 20 SQ CM/< Modifier: Quantity: 1 CPT4 Code: Description: ICD-10 Diagnosis Description L89.623 Pressure ulcer of left heel, stage 3 Modifier: Quantity: Physician Procedures CPT4 Code: [id number] Description: 11042 - WC PHYS SUBQ TISS 20 SQ CM Modifier: Quantity: 1 CPT4 Code: Description: ICD-10 Diagnosis Description L89.623 Pressure ulcer of left heel, stage 3 Modifier: Quantity: Electronic Signature(s) Signed: 05/21/2020 12:28:58 PM By: Worthy Keeler PA-C Previous  Signature: 05/21/2020 12:28:45 PM Version By: Worthy Keeler PA-C Entered By: Worthy Keeler on 05/21/2020 12:28:58

## 2020-05-22 DIAGNOSIS — M21372 Foot drop, left foot: Secondary | ICD-10-CM | POA: Diagnosis not present

## 2020-05-22 DIAGNOSIS — R2681 Unsteadiness on feet: Secondary | ICD-10-CM | POA: Diagnosis not present

## 2020-05-22 DIAGNOSIS — R262 Difficulty in walking, not elsewhere classified: Secondary | ICD-10-CM | POA: Diagnosis not present

## 2020-05-22 DIAGNOSIS — Z9181 History of falling: Secondary | ICD-10-CM | POA: Diagnosis not present

## 2020-05-22 DIAGNOSIS — R260 Ataxic gait: Secondary | ICD-10-CM | POA: Diagnosis not present

## 2020-05-22 DIAGNOSIS — M21371 Foot drop, right foot: Secondary | ICD-10-CM | POA: Diagnosis not present

## 2020-05-27 DIAGNOSIS — M21371 Foot drop, right foot: Secondary | ICD-10-CM | POA: Diagnosis not present

## 2020-05-27 DIAGNOSIS — R2681 Unsteadiness on feet: Secondary | ICD-10-CM | POA: Diagnosis not present

## 2020-05-27 DIAGNOSIS — M21372 Foot drop, left foot: Secondary | ICD-10-CM | POA: Diagnosis not present

## 2020-05-27 DIAGNOSIS — Z9181 History of falling: Secondary | ICD-10-CM | POA: Diagnosis not present

## 2020-05-27 DIAGNOSIS — R262 Difficulty in walking, not elsewhere classified: Secondary | ICD-10-CM | POA: Diagnosis not present

## 2020-05-27 DIAGNOSIS — R260 Ataxic gait: Secondary | ICD-10-CM | POA: Diagnosis not present

## 2020-05-28 ENCOUNTER — Other Ambulatory Visit: Payer: Self-pay

## 2020-05-28 ENCOUNTER — Encounter: Payer: Medicare Other | Admitting: Physician Assistant

## 2020-05-28 DIAGNOSIS — Z885 Allergy status to narcotic agent status: Secondary | ICD-10-CM | POA: Diagnosis not present

## 2020-05-28 DIAGNOSIS — M21371 Foot drop, right foot: Secondary | ICD-10-CM | POA: Diagnosis not present

## 2020-05-28 DIAGNOSIS — M21372 Foot drop, left foot: Secondary | ICD-10-CM | POA: Diagnosis not present

## 2020-05-28 DIAGNOSIS — G603 Idiopathic progressive neuropathy: Secondary | ICD-10-CM | POA: Diagnosis not present

## 2020-05-28 DIAGNOSIS — L97512 Non-pressure chronic ulcer of other part of right foot with fat layer exposed: Secondary | ICD-10-CM | POA: Diagnosis not present

## 2020-05-28 DIAGNOSIS — L97412 Non-pressure chronic ulcer of right heel and midfoot with fat layer exposed: Secondary | ICD-10-CM | POA: Diagnosis not present

## 2020-05-28 DIAGNOSIS — L89623 Pressure ulcer of left heel, stage 3: Secondary | ICD-10-CM | POA: Diagnosis not present

## 2020-05-28 NOTE — Progress Notes (Addendum)
JANELLE, CULTON ([id number]) Visit Report for 05/28/2020 Arrival Information Details Patient Name: [name], [name] Date of Service: 05/28/2020 10:45 AM Medical Record Number: [id number] Patient Account Number: [id number] Date of Birth/Sex: 1935/12/30 (84 y.o. M) Treating RN: Cornell Barman Primary Care Troi Bechtold: [name] Other Clinician: Referring Lemmie Vanlanen: [name] Treating Paddy Walthall/Extender: Skipper Cliche in Treatment: 15 Visit Information History Since Last Visit Added or deleted any medications: No Patient Arrived: Walker Has Dressing in Place as Prescribed: Yes Arrival Time: 10:53 Pain Present Now: No Accompanied By: self Transfer Assistance: None Patient Identification Verified: Yes Secondary Verification Process Completed: Yes Patient Requires Transmission-Based Precautions: No Patient Has Alerts: No Electronic Signature(s) Signed: 05/28/2020 5:16:15 PM By: Gretta Cool, BSN, RN, CWS, Kim RN, BSN Entered By: Gretta Cool, BSN, RN, CWS, Kim on 05/28/2020 10:55:00 [name] ([id number]) -------------------------------------------------------------------------------- Clinic Level of Care Assessment Details Patient Name: [name] Date of Service: 05/28/2020 10:45 AM Medical Record Number: [id number] Patient Account Number: [id number] Date of Birth/Sex: 10/09/1935 (84 y.o. M) Treating RN: Cornell Barman Primary Care Errika Narvaiz: [name] Other Clinician: Referring Zimir Kittleson: [name] Treating Agustina Witzke/Extender: Skipper Cliche in Treatment: 15 Clinic Level of Care Assessment Items TOOL 4 Quantity Score []  - Use when only an EandM is performed on FOLLOW-UP visit 0 ASSESSMENTS - Nursing Assessment / Reassessment X - Reassessment of Co-morbidities (includes updates in patient status) 1 10 X- 1 5 Reassessment of Adherence to Treatment Plan ASSESSMENTS - Wound and Skin Assessment / Reassessment X - Simple Wound Assessment / Reassessment - one wound 1  5 []  - 0 Complex Wound Assessment / Reassessment - multiple wounds []  - 0 Dermatologic / Skin Assessment (not related to wound area) ASSESSMENTS - Focused Assessment []  - Circumferential Edema Measurements - multi extremities 0 []  - 0 Nutritional Assessment / Counseling / Intervention []  - 0 Lower Extremity Assessment (monofilament, tuning fork, pulses) []  - 0 Peripheral Arterial Disease Assessment (using hand held doppler) ASSESSMENTS - Ostomy and/or Continence Assessment and Care []  - Incontinence Assessment and Management 0 []  - 0 Ostomy Care Assessment and Management (repouching, etc.) PROCESS - Coordination of Care X - Simple Patient / Family Education for ongoing care 1 15 []  - 0 Complex (extensive) Patient / Family Education for ongoing care []  - 0 Staff obtains Programmer, systems, Records, Test Results / Process Orders []  - 0 Staff telephones HHA, Nursing Homes / Clarify orders / etc []  - 0 Routine Transfer to another Facility (non-emergent condition) []  - 0 Routine Hospital Admission (non-emergent condition) []  - 0 New Admissions / Biomedical engineer / Ordering NPWT, Apligraf, etc. []  - 0 Emergency Hospital Admission (emergent condition) X- 1 10 Simple Discharge Coordination []  - 0 Complex (extensive) Discharge Coordination PROCESS - Special Needs []  - Pediatric / Minor Patient Management 0 []  - 0 Isolation Patient Management []  - 0 Hearing / Language / Visual special needs []  - 0 Assessment of Community assistance (transportation, D/C planning, etc.) []  - 0 Additional assistance / Altered mentation []  - 0 Support Surface(s) Assessment (bed, cushion, seat, etc.) INTERVENTIONS - Wound Cleansing / Measurement Holt, Marcus ([id number]) X- 1 5 Simple Wound Cleansing - one wound []  - 0 Complex Wound Cleansing - multiple wounds X- 1 5 Wound Imaging (photographs - any number of wounds) []  - 0 Wound Tracing (instead of photographs) X- 1 5 Simple Wound Measurement  - one wound []  - 0 Complex Wound Measurement - multiple wounds INTERVENTIONS - Wound Dressings []  - Small Wound Dressing one or multiple wounds 0 X- 1 15  Medium Wound Dressing one or multiple wounds $RemoveBeforeD'[]'tUZrhMOZBUOLrD$  - 0 Large Wound Dressing one or multiple wounds $RemoveBeforeD'[]'orcBHftbFXvyiF$  - 0 Application of Medications - topical $RemoveB'[]'SjMUzQFS$  - 0 Application of Medications - injection INTERVENTIONS - Miscellaneous $RemoveBeforeD'[]'MweQTSHWMiOwJP$  - External ear exam 0 $Remo'[]'ttMHP$  - 0 Specimen Collection (cultures, biopsies, blood, body fluids, etc.) $RemoveBefor'[]'grHRFAQasgJu$  - 0 Specimen(s) / Culture(s) sent or taken to Lab for analysis $RemoveBefo'[]'hWwxglaEyfC$  - 0 Patient Transfer (multiple staff / Civil Service fast streamer / Similar devices) $RemoveBeforeDE'[]'FjMUMAZQSGJQeJx$  - 0 Simple Staple / Suture removal (25 or less) $Remove'[]'JOKnkhF$  - 0 Complex Staple / Suture removal (26 or more) $Remove'[]'HXeTMtJ$  - 0 Hypo / Hyperglycemic Management (close monitor of Blood Glucose) $RemoveBefore'[]'DDRxmjcLfvqHk$  - 0 Ankle / Brachial Index (ABI) - do not check if billed separately X- 1 5 Vital Signs Has the patient been seen at the hospital within the last three years: Yes Total Score: 80 Level Of Care: New/Established - Level 3 Electronic Signature(s) Signed: 05/28/2020 5:16:15 PM By: Gretta Cool, BSN, RN, CWS, Kim RN, BSN Entered By: Gretta Cool, BSN, RN, CWS, Kim on 05/28/2020 11:18:22 [name] ([id number]) -------------------------------------------------------------------------------- Encounter Discharge Information Details Patient Name: [name] Date of Service: 05/28/2020 10:45 AM Medical Record Number: [id number] Patient Account Number: [id number] Date of Birth/Sex: [birth year] (84 y.o. M) Treating RN: Cornell Barman Primary Care Celestine Bougie: [name] Other Clinician: Referring Marybell Robards: [name] Treating Ricci Paff/Extender: Skipper Cliche in Treatment: 15 Encounter Discharge Information Items Discharge Condition: Stable Ambulatory Status: Walker Discharge Destination: Home Transportation: Private Auto Accompanied By: self Schedule Follow-up Appointment: Yes Clinical Summary of  Care: Electronic Signature(s) Signed: 05/28/2020 5:16:15 PM By: Gretta Cool, BSN, RN, CWS, Kim RN, BSN Entered By: Gretta Cool, BSN, RN, CWS, Kim on 05/28/2020 11:20:56 [name] ([id number]) -------------------------------------------------------------------------------- Lower Extremity Assessment Details Patient Name: [name] Date of Service: 05/28/2020 10:45 AM Medical Record Number: [id number] Patient Account Number: [id number] Date of Birth/Sex: Oct 13, 1935 (84 y.o. M) Treating RN: Cornell Barman Primary Care Dior Dominik: [name] Other Clinician: Referring Caya Soberanis: [name] Treating Braxxton Stoudt/Extender: Skipper Cliche in Treatment: 15 Edema Assessment Assessed: Shirlyn Goltz: No] Patrice Paradise: No] Edema: [Left: N] [Right: o] Vascular Assessment Pulses: Dorsalis Pedis Palpable: [Left:Yes] Electronic Signature(s) Signed: 05/28/2020 5:16:15 PM By: Gretta Cool, BSN, RN, CWS, Kim RN, BSN Entered By: Gretta Cool, BSN, RN, CWS, Kim on 05/28/2020 11:03:17 [name] ([id number]) -------------------------------------------------------------------------------- Multi Wound Chart Details Patient Name: [name] Date of Service: 05/28/2020 10:45 AM Medical Record Number: [id number] Patient Account Number: [id number] Date of Birth/Sex: 05/29/1936 (84 y.o. M) Treating RN: Cornell Barman Primary Care [name] [name]: [name] Other Clinician: Referring Sharis Keeran: [name] Treating Charlot Gouin/Extender: Skipper Cliche in Treatment: 15 Vital Signs Height(in): 72 Pulse(bpm): 89 Weight(lbs): 200 Blood Pressure(mmHg): 128/72 Body Mass Index(BMI): 27 Temperature(F): 98.4 Respiratory Rate(breaths/min): 16 Photos: [N/A:N/A] Wound Location: Left, Plantar Calcaneus N/A N/A Wounding Event: Trauma N/A N/A Primary Etiology: Trauma, Other N/A N/A Secondary Etiology: Pressure Ulcer N/A N/A Comorbid History: Hypertension, Myocardial Infarction, N/A N/A Osteoarthritis, Neuropathy Date  Acquired: 10/21/2019 N/A N/A Weeks of Treatment: 15 N/A N/A Wound Status: Open N/A N/A Measurements L x W x D (cm) 0.5x0.5x0.3 N/A N/A Area (cm) : 0.196 N/A N/A Volume (cm) : 0.059 N/A N/A % Reduction in Area: 0.00% N/A N/A % Reduction in Volume: 25.30% N/A N/A Classification: Full Thickness Without Exposed N/A N/A Support Structures Exudate Amount: Medium N/A N/A Exudate Type: Serous N/A N/A Exudate Color: amber N/A N/A Wound Margin: Flat and Intact N/A N/A Granulation Amount: Large (67-100%) N/A N/A Granulation Quality: Pale N/A N/A Necrotic Amount: None Present (0%) N/A  N/A Exposed Structures: Fat Layer (Subcutaneous Tissue): N/A N/A Yes Fascia: No Tendon: No Muscle: No Joint: No Bone: No Epithelialization: None N/A N/A Treatment Notes Electronic Signature(s) Signed: 05/28/2020 5:16:15 PM By: Gretta Cool, BSN, RN, CWS, Kim RN, BSN Entered By: Gretta Cool, BSN, RN, CWS, Kim on 05/28/2020 11:16:54 [name] ([id number]) -------------------------------------------------------------------------------- Multi-Disciplinary Care Plan Details Patient Name: [name] Date of Service: 05/28/2020 10:45 AM Medical Record Number: [id number] Patient Account Number: [id number] Date of Birth/Sex: 29-Jun-1935 (84 y.o. M) Treating RN: Cornell Barman Primary Care Alvina Strother: [name] Other Clinician: Referring Toryn Mcclinton: [name] Treating Naomi Fitton/Extender: Skipper Cliche in Treatment: 15 Active Inactive Necrotic Tissue Nursing Diagnoses: Impaired tissue integrity related to necrotic/devitalized tissue Knowledge deficit related to management of necrotic/devitalized tissue Goals: Necrotic/devitalized tissue will be minimized in the wound bed Date Initiated: 04/14/2020 Target Resolution Date: 05/14/2020 Goal Status: Active Patient/caregiver will verbalize understanding of reason and process for debridement of necrotic tissue Date Initiated: 04/14/2020 Target Resolution  Date: 05/14/2020 Goal Status: Active Interventions: Assess patient pain level pre-, during and post procedure and prior to discharge Provide education on necrotic tissue and debridement process Treatment Activities: Apply topical anesthetic as ordered : 04/14/2020 Excisional debridement : 04/14/2020 Notes: Wound/Skin Impairment Nursing Diagnoses: Impaired tissue integrity Knowledge deficit related to ulceration/compromised skin integrity Goals: Patient/caregiver will verbalize understanding of skin care regimen Date Initiated: 02/11/2020 Date Inactivated: 05/05/2020 Target Resolution Date: 02/24/2020 Goal Status: Met Ulcer/skin breakdown will have a volume reduction of 80% by week 12 Date Initiated: 05/05/2020 Target Resolution Date: 05/05/2020 Goal Status: Active Ulcer/skin breakdown will heal within 14 weeks Date Initiated: 05/05/2020 Target Resolution Date: 05/19/2020 Goal Status: Active Interventions: Assess patient/caregiver ability to obtain necessary supplies Assess patient/caregiver ability to perform ulcer/skin care regimen upon admission and as needed Assess ulceration(s) every visit Provide education on ulcer and skin care Treatment Activities: Skin care regimen initiated : 02/11/2020 Notes: [name], [name] ([id number]) Electronic Signature(s) Signed: 05/28/2020 5:16:15 PM By: Gretta Cool, BSN, RN, CWS, Kim RN, BSN Entered By: Gretta Cool, BSN, RN, CWS, Kim on 05/28/2020 11:16:47 [name] ([id number]) -------------------------------------------------------------------------------- Pain Assessment Details Patient Name: [name] Date of Service: 05/28/2020 10:45 AM Medical Record Number: [id number] Patient Account Number: [id number] Date of Birth/Sex: [birth year] (84 y.o. M) Treating RN: Cornell Barman Primary Care Sabeen Piechocki: [name] Other Clinician: Referring Tayja Manzer: [name] Treating Jkayla Spiewak/Extender: Skipper Cliche in Treatment: 15 Active  Problems Location of Pain Severity and Description of Pain Patient Has Paino No Site Locations Pain Management and Medication Current Pain Management: Electronic Signature(s) Signed: 05/28/2020 5:16:15 PM By: Gretta Cool, BSN, RN, CWS, Kim RN, BSN Entered By: Gretta Cool, BSN, RN, CWS, Kim on 05/28/2020 10:55:30 [name] ([id number]) -------------------------------------------------------------------------------- Patient/Caregiver Education Details Patient Name: [name] Date of Service: 05/28/2020 10:45 AM Medical Record Number: [id number] Patient Account Number: [id number] Date of Birth/Gender: 01/23/1936 (84 y.o. M) Treating RN: Cornell Barman Primary Care Physician: [name] Other Clinician: Referring Physician: [name] Treating Physician/Extender: Skipper Cliche in Treatment: 15 Education Assessment Education Provided To: Patient Education Topics Provided Pressure: Handouts: Pressure Ulcers: Care and Offloading, Preventing Pressure Ulcers Methods: Demonstration, Explain/Verbal Responses: State content correctly Wound/Skin Impairment: Handouts: Caring for Your Ulcer Methods: Demonstration, Explain/Verbal Responses: State content correctly Electronic Signature(s) Signed: 05/28/2020 5:16:15 PM By: Gretta Cool, BSN, RN, CWS, Kim RN, BSN Entered By: Gretta Cool, BSN, RN, CWS, Kim on 05/28/2020 11:18:53 [name] ([id number]) -------------------------------------------------------------------------------- Wound Assessment Details Patient Name: [name] Date of Service: 05/28/2020 10:45 AM Medical Record Number: [id number] Patient Account Number: [id number] Date of Birth/Sex:  March 04, 1936 (84 y.o. M) Treating RN: Cornell Barman Primary Care Tamel Abel: [name] Other Clinician: Referring Megan Presti: [name] Treating Raynald Rouillard/Extender: Skipper Cliche in Treatment: 15 Wound Status Wound Number: 1 Primary Etiology: Trauma, Other Wound  Location: Left, Plantar Calcaneus Secondary Pressure Ulcer Etiology: Wounding Event: Trauma Wound Status: Open Date Acquired: 10/21/2019 Comorbid Hypertension, Myocardial Infarction, Osteoarthritis, Weeks Of Treatment: 15 History: Neuropathy Clustered Wound: No Photos Wound Measurements Length: (cm) 0.5 Width: (cm) 0.5 Depth: (cm) 0.3 Area: (cm) 0.196 Volume: (cm) 0.059 % Reduction in Area: 0% % Reduction in Volume: 25.3% Epithelialization: None Tunneling: No Undermining: No Wound Description Classification: Full Thickness Without Exposed Support Structures Wound Margin: Flat and Intact Exudate Amount: Medium Exudate Type: Serous Exudate Color: amber Foul Odor After Cleansing: No Slough/Fibrino Yes Wound Bed Granulation Amount: Large (67-100%) Exposed Structure Granulation Quality: Pale Fascia Exposed: No Necrotic Amount: None Present (0%) Fat Layer (Subcutaneous Tissue) Exposed: Yes Tendon Exposed: No Muscle Exposed: No Joint Exposed: No Bone Exposed: No Treatment Notes Wound #1 (Left, Plantar Calcaneus) Notes endoform moisten with saline, foam secured with stretch net. (tape was pulling dried skin) Electronic Signature(s) Signed: 05/28/2020 5:16:15 PM By: Gretta Cool, BSN, RN, CWS, Kim RN, BSN [place], [employer] ([id number]) Entered By: Gretta Cool, BSN, RN, CWS, Kim on 05/28/2020 11:05:01 [name] ([id number]) -------------------------------------------------------------------------------- Vitals Details Patient Name: [name] Date of Service: 05/28/2020 10:45 AM Medical Record Number: [id number] Patient Account Number: [id number] Date of Birth/Sex: 07-20-1935 (84 y.o. M) Treating RN: Cornell Barman Primary Care Terrilyn Tyner: [name] Other Clinician: Referring Carmelo Reidel: [name] Treating Gino Garrabrant/Extender: Skipper Cliche in Treatment: 15 Vital Signs Time Taken: 10:55 Temperature (F): 98.4 Height (in): 72 Pulse (bpm): 84 Weight (lbs):  200 Respiratory Rate (breaths/min): 16 Body Mass Index (BMI): 27.1 Blood Pressure (mmHg): 128/72 Reference Range: 80 - 120 mg / dl Electronic Signature(s) Signed: 05/28/2020 5:16:15 PM By: Gretta Cool, BSN, RN, CWS, Kim RN, BSN Entered By: Gretta Cool, BSN, RN, CWS, Kim on 05/28/2020 10:55:23

## 2020-05-28 NOTE — Progress Notes (Addendum)
MACAI, SISNEROS ([id number]) Visit Report for 05/28/2020 Chief Complaint Document Details Patient Name: [name], [name] Date of Service: 05/28/2020 10:45 AM Medical Record Number: [id number] Patient Account Number: [id number] Date of Birth/Sex: 12-05-1935 (84 y.o. M) Treating RN: Cornell Barman Primary Care Provider: [name] Other Clinician: Referring Provider: [name] Treating Provider/Extender: Skipper Cliche in Treatment: 15 Information Obtained from: Patient Chief Complaint Pressure ulcer left heel Electronic Signature(s) Signed: 05/28/2020 10:51:45 AM By: Worthy Keeler PA-C Entered By: Worthy Keeler on 05/28/2020 10:51:44 [name] ([id number]) -------------------------------------------------------------------------------- HPI Details Patient Name: [name] Date of Service: 05/28/2020 10:45 AM Medical Record Number: [id number] Patient Account Number: [id number] Date of Birth/Sex: Oct 27, 1935 (84 y.o. M) Treating RN: Cornell Barman Primary Care Provider: [name] Other Clinician: Referring Provider: [name] Treating Provider/Extender: Skipper Cliche in Treatment: 15 History of Present Illness HPI Description: 02/11/2020 on evaluation today patient appears to be doing somewhat poorly on initial evaluation concerning his heel. He has been using a topical antibiotic ointment which does not seem to really have been doing the best job for him. He tells me that he had a wound that occurred as a result of his padding in the bottom of his shoe wear his foot drop brace goes actually had worn through and his foot was actually rubbing on the bottom of the shoe on the brace itself. Subsequently this ended up with a wound and he in turn had a lot of issues following. Fortunately there is no signs of active infection at this time which is good news in my opinion. With that being said the wound does appear to be somewhat moist with some evidence  of maceration unfortunately. He has no pain he does have neuropathy. He also has bilateral foot drop which she wears braces. This does appear to be however a pressure injury secondary to the AFO brace. The patient has been seen by podiatry since May 2021 unfortunately this is not healing hence the reason that he is coming to see [place] at this point to see if there is anything we can do to help in this regard. 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 9/15; the patient missed his appointment last week due to illness with his wife who had to be hospitalized. We are using Hydrofera Blue to the small punched out area in the middle of the left plantar heel. 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 This is felt to be pressure from the brace itself. 03/05/2020 upon evaluation today patient appears to be doing well in regard to his wound on the heel. He has been tolerating the dressing changes without complication which is great news. Fortunately there is no signs of active infection which I am very happy with. 03/19/2020 upon evaluation today patient appears to be doing a little worse in regard to his heel based on what I am seeing at this point. There actually appears to be a pocket off to the side laterally that is problematic for him at this point. I am not to clear this way with some sharp debridement currently. The patient was in agreement with that plan. 10/20; patient I do not usually see. He has a small wound on the left plantar heel. Some depth. He is apparently completing antibiotics. He has bilateral foot drop and therefore is AFO braces limiting what we can do to offload this. 04/14/2020 upon  evaluation today patient's heel actually appears to be doing fairly well. There is no signs of active infection at this time which is great news and overall I feel like he is managing nicely. The  only issue he has is that the individual who has been helping change the dressings at home unfortunately has not been available for the past week or so. She is out of town. Nonetheless he has been going to urgent care to have the dressings changed as he is not able to do this on his own and states that his wife is unable to do it for him. 11/10; patient in today for a nurse visit but I was asked to look at him. I note that we are treating him for a small punched-out area on the left plantar heel. He arrived with a new superficial area close to this wound I am not really clear of the exact etiology of this. The bigger problem was increased swelling warmth of the lower leg below the knee but intense erythema involving the entire anterior knee and medial thigh. His entire leg is warm and swollen. However it was not completely obvious to me that there was a big change in the lower leg however the nurses and the patient certainly thought there was. The patient stated that after his 2-week of trimethoprim sulfamethoxazole his legs looked very similar. His vital signs are stable he is afebrile but nevertheless if this is all infection this is extensive and failing outpatient management 04/28/2020 on evaluation today patient presents for follow-up after having been seen in the ER and admitted to the hospital due to cellulitis in the left knee. He has continued to have issues at this point with regard to his knee it still very hot to touch upon evaluation today. His heel ulcer is actually measuring smaller than the last time I saw him but he still is having a lot of difficulty with dry and flaky skin over his feet in general. 05/05/2020 on evaluation today patient appears to be doing pretty well in regard to his heel ulcer. This is measuring a little smaller which is good news. Fortunately there is no signs of active infection at this time. No fevers, chills, nausea, vomiting, or diarrhea. He does have a new  area on his right dorsal surface of his foot. With that being said this region we really do not know exactly what happened where this came from it appears to be somewhat abnormal in that there is a very well demarcated line that almost looks as if he could have had a burn or something of the sort that was partially protected 1 area but got burned over a different region. Either way I think we can probably get this healed fairly quickly. 05/14/2020 on evaluation today patient appears to be doing about the same in regard to his heel ulcer. We may need to try something different to see if this will be beneficial for him. I am thinking of switching him to collagen. 05/21/2020 on evaluation today patient appears to be doing excellent in regard to his foot ulcer from the standpoint of the overall appearance with that being said were just having some trouble getting it to granulate in. I do believe he would benefit from potentially utilizing a skin substitute though at this point in time at the end of the year I think it may be best to hold off till next year if we need to do this but I am going to  switch things out a little bit regular try endoform to see if this will be beneficial for him. 05/28/2020 upon evaluation today patient's wound actually appears to be doing fairly well is very healthy and I am pleased with the overall appearance in that regard. With that being said I do not see any signs of active infection at this time which is great news. No fevers, chills, nausea, vomiting, or diarrhea. Unfortunately the adhesive is sticking to his skin around the wound and causing some minor skin tearing. We need to avoid this if at all possible. Electronic Signature(s) Signed: 05/28/2020 1:22:47 PM By: Worthy Keeler PA-C Entered By: Worthy Keeler on 05/28/2020 13:22:46 [name] ([id number]) -------------------------------------------------------------------------------- Physical Exam Details Patient  Name: [name] Date of Service: 05/28/2020 10:45 AM Medical Record Number: [id number] Patient Account Number: [id number] Date of Birth/Sex: 21-Dec-1935 (84 y.o. M) Treating RN: Cornell Barman Primary Care Provider: [name] Other Clinician: Referring Provider: [name] Treating Provider/Extender: Skipper Cliche in Treatment: 21 Constitutional Well-nourished and well-hydrated in no acute distress. Respiratory normal breathing without difficulty. Psychiatric this patient is able to make decisions and demonstrates good insight into disease process. Alert and Oriented x 3. pleasant and cooperative. Notes Upon inspection patient's wound bed actually showed signs of good granulation were can actually continue with endoform I feel like that does seem to be doing well for him. There is no need for sharp debridement today which is good news. Electronic Signature(s) Signed: 05/28/2020 1:22:59 PM By: Worthy Keeler PA-C Entered By: Worthy Keeler on 05/28/2020 13:22:59 Mccullar, Fritz Holt ([id number]) -------------------------------------------------------------------------------- Physician Orders Details Patient Name: [name] Date of Service: 05/28/2020 10:45 AM Medical Record Number: [id number] Patient Account Number: [id number] Date of Birth/Sex: August 20, 1935 (84 y.o. 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 Follow-up Appointments Wound #1 Left,Plantar Calcaneus o Return Appointment in 1 week. Off-Loading Wound #1 Left,Plantar Calcaneus o Other: - keep pressure off of heel Electronic Signature(s) Signed: 05/28/2020 4:55:27 PM By: Worthy Keeler PA-C Signed: 05/28/2020 5:16:15 PM By: Gretta Cool, BSN, RN, CWS, Kim RN, BSN Entered By: Gretta Cool, BSN, RN, CWS, Kim on 05/28/2020 11:17:59 [name] ([id number]) -------------------------------------------------------------------------------- Problem List Details Patient Name: [name] Date of Service: 05/28/2020 10:45 AM Medical Record Number: [id number] Patient Account Number: [id number] Date of Birth/Sex: 06-20-1935 (84 y.o. 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 02/11/2020 No Yes M21.372 Foot drop, left foot 02/11/2020 No Yes M21.371 Foot  drop, right foot 02/11/2020 No Yes L03.116 Cellulitis of left lower limb 04/22/2020 No Yes Inactive Problems Resolved Problems Electronic Signature(s) Signed: 05/28/2020 10:51:37 AM By: Worthy Keeler PA-C Entered By: Worthy Keeler on 05/28/2020 10:51:36 Motton, Fritz Holt  ([id number]) -------------------------------------------------------------------------------- Progress Note Details Patient Name: [name] Date of Service: 05/28/2020 10:45 AM Medical Record Number: [id number] Patient Account Number: [id number] Date of Birth/Sex: [birth year] (84 y.o. M) Treating RN: Cornell Barman Primary Care Provider: [name] Other Clinician: Referring Provider: [name] Treating Provider/Extender: Skipper Cliche in Treatment: 15 Subjective Chief Complaint Information obtained from Patient Pressure ulcer left heel History of Present Illness (HPI) 02/11/2020 on evaluation today patient appears to be doing somewhat poorly on initial evaluation concerning his heel. He has been using a topical antibiotic ointment which does not seem to really have been doing the best job for him. He tells me that he had a wound that occurred as a result of his padding in the bottom of his shoe wear his foot drop brace goes actually had worn through and his foot was actually rubbing on the bottom of the shoe on the brace itself. Subsequently this ended up with a wound and he in turn had a lot of issues following. Fortunately there is no signs of active infection at this time which is good news in my opinion. With that being said the wound does appear to be somewhat moist with some evidence of maceration unfortunately. He has no pain he does have neuropathy. He also has bilateral foot drop which she wears braces. This does appear to be however a pressure injury secondary to the AFO brace. The patient has been seen by podiatry since May 2021 unfortunately this is not healing hence the reason that he is coming to see [place] at this point to see if there is anything we can do to help in this regard. 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 9/15; the patient missed his appointment last week  due to illness with his wife who had to be hospitalized. We are using Hydrofera Blue to the small punched out area in the middle of the left plantar heel. 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 This is felt to be pressure from the brace itself. 03/05/2020 upon evaluation today patient appears to be doing well in regard to his wound on the heel. He has been tolerating the dressing changes without complication which is great news. Fortunately there is no signs of active infection which I am very happy with. 03/19/2020 upon evaluation today patient appears to be doing a little worse in regard to his heel based on what I am seeing at this point. There actually appears to be a pocket off to the side laterally that is problematic for him at this point. I am not to clear this way with some sharp debridement currently. The patient was in agreement with that plan. 10/20; patient I do not usually see. He has a small wound on the left plantar heel. Some depth. He is apparently completing antibiotics. He has bilateral foot drop and therefore is AFO braces limiting what we can do to offload this. 04/14/2020 upon evaluation today patient's heel actually appears to be doing fairly well. There is no signs of active infection at this time which is great news and overall I feel like he is managing nicely. The only issue he  has is that the individual who has been helping change the dressings at home unfortunately has not been available for the past week or so. She is out of town. Nonetheless he has been going to urgent care to have the dressings changed as he is not able to do this on his own and states that his wife is unable to do it for him. 11/10; patient in today for a nurse visit but I was asked to look at him. I note that we are treating him for a small punched-out area on the left plantar heel. He arrived with a new superficial area close to this wound I am not really clear of  the exact etiology of this. The bigger problem was increased swelling warmth of the lower leg below the knee but intense erythema involving the entire anterior knee and medial thigh. His entire leg is warm and swollen. However it was not completely obvious to me that there was a big change in the lower leg however the nurses and the patient certainly thought there was. The patient stated that after his 2-week of trimethoprim sulfamethoxazole his legs looked very similar. His vital signs are stable he is afebrile but nevertheless if this is all infection this is extensive and failing outpatient management 04/28/2020 on evaluation today patient presents for follow-up after having been seen in the ER and admitted to the hospital due to cellulitis in the left knee. He has continued to have issues at this point with regard to his knee it still very hot to touch upon evaluation today. His heel ulcer is actually measuring smaller than the last time I saw him but he still is having a lot of difficulty with dry and flaky skin over his feet in general. 05/05/2020 on evaluation today patient appears to be doing pretty well in regard to his heel ulcer. This is measuring a little smaller which is good news. Fortunately there is no signs of active infection at this time. No fevers, chills, nausea, vomiting, or diarrhea. He does have a new area on his right dorsal surface of his foot. With that being said this region we really do not know exactly what happened where this came from it appears to be somewhat abnormal in that there is a very well demarcated line that almost looks as if he could have had a burn or something of the sort that was partially protected 1 area but got burned over a different region. Either way I think we can probably get this healed fairly quickly. 05/14/2020 on evaluation today patient appears to be doing about the same in regard to his heel ulcer. We may need to try something different to see  if this will be beneficial for him. I am thinking of switching him to collagen. 05/21/2020 on evaluation today patient appears to be doing excellent in regard to his foot ulcer from the standpoint of the overall appearance with that being said were just having some trouble getting it to granulate in. I do believe he would benefit from potentially utilizing a skin substitute though at this point in time at the end of the year I think it may be best to hold off till next year if we need to do this but I am going to switch things out a little bit regular try endoform to see if this will be beneficial for him. 05/28/2020 upon evaluation today patient's wound actually appears to be doing fairly well is very healthy and I am pleased  with the overall appearance in that regard. With that being said I do not see any signs of active infection at this time which is great news. No fevers, chills, nausea, vomiting, or diarrhea. Unfortunately the adhesive is sticking to his skin around the wound and causing some minor skin tearing. We need to avoid this if at all possible. Adderly, Fritz Holt ([id number]) Objective Constitutional Well-nourished and well-hydrated in no acute distress. Vitals Time Taken: 10:55 AM, Height: 72 in, Weight: 200 lbs, BMI: 27.1, Temperature: 98.4 F, Pulse: 84 bpm, Respiratory Rate: 16 breaths/min, Blood Pressure: 128/72 mmHg. Respiratory normal breathing without difficulty. Psychiatric this patient is able to make decisions and demonstrates good insight into disease process. Alert and Oriented x 3. pleasant and cooperative. General Notes: Upon inspection patient's wound bed actually showed signs of good granulation were can actually continue with endoform I feel like that does seem to be doing well for him. There is no need for sharp debridement today which is good news. Integumentary (Hair, Skin) Wound #1 status is Open. Original cause of wound was Trauma. The wound is located on the  Left,Plantar Calcaneus. The wound measures 0.5cm length x 0.5cm width x 0.3cm depth; 0.196cm^2 area and 0.059cm^3 volume. There is Fat Layer (Subcutaneous Tissue) exposed. There is no tunneling or undermining noted. There is a medium amount of serous drainage noted. The wound margin is flat and intact. There is large (67- 100%) pale granulation within the wound bed. There is no necrotic tissue within the wound bed. 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 Follow-up Appointments: Wound #1 Left,Plantar Calcaneus: Return Appointment in 1 week. Off-Loading: Wound #1 Left,Plantar Calcaneus: Other: - keep pressure off of heel Lehrke, Claudell ([id number]) 1. Would recommend currently that we going to continue with the wound care measures as before specifically with regard to the endoform which I think is doing an excellent job. 2. Muscle can recommend that we have the patient continue with a foam dressing but will use a nonadhesive foam and then secure this with stretch net in order to avoid hopefully any additional injury to his periwound region. 3. The patient does need to keep pressure off of the heel area he seems to be attempting to do that as best he can. We will see patient back for reevaluation in 1 week here in the clinic. If anything worsens or changes patient will contact our office for  additional recommendations. Electronic Signature(s) Signed: 05/28/2020 1:30:44 PM By: Worthy Keeler PA-C Entered By: Worthy Keeler on 05/28/2020 13:30:44 Perezgarcia, Fritz Holt ([id number]) -------------------------------------------------------------------------------- SuperBill Details Patient Name: [name] Date of Service: 05/28/2020 Medical Record Number: [id number] Patient Account Number: [id number] Date of Birth/Sex: Aug 28, 1935 (84 y.o. 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  M21.372 Foot drop, left foot M21.371 Foot drop, right foot L03.116 Cellulitis of left lower limb Facility Procedures CPT4 Code: 63335456 Description: 25638 - WOUND CARE VISIT-LEV 3 EST PT Modifier: Quantity: 1 Physician Procedures CPT4 Code: [id number] Description: 76811 - WC PHYS LEVEL 3 - EST PT Modifier: Quantity: 1 CPT4 Code: Description: ICD-10 Diagnosis Description L89.623 Pressure ulcer of left heel, stage 3 L97.512 Non-pressure chronic ulcer of other part of right foot with fat layer e 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

## 2020-06-03 DIAGNOSIS — Z9181 History of falling: Secondary | ICD-10-CM | POA: Diagnosis not present

## 2020-06-03 DIAGNOSIS — M21372 Foot drop, left foot: Secondary | ICD-10-CM | POA: Diagnosis not present

## 2020-06-03 DIAGNOSIS — R262 Difficulty in walking, not elsewhere classified: Secondary | ICD-10-CM | POA: Diagnosis not present

## 2020-06-03 DIAGNOSIS — R260 Ataxic gait: Secondary | ICD-10-CM | POA: Diagnosis not present

## 2020-06-03 DIAGNOSIS — M21371 Foot drop, right foot: Secondary | ICD-10-CM | POA: Diagnosis not present

## 2020-06-03 DIAGNOSIS — R2681 Unsteadiness on feet: Secondary | ICD-10-CM | POA: Diagnosis not present

## 2020-06-04 ENCOUNTER — Encounter: Payer: Medicare Other | Admitting: Physician Assistant

## 2020-06-04 ENCOUNTER — Other Ambulatory Visit: Payer: Self-pay

## 2020-06-04 DIAGNOSIS — L89623 Pressure ulcer of left heel, stage 3: Secondary | ICD-10-CM | POA: Diagnosis not present

## 2020-06-04 DIAGNOSIS — M21372 Foot drop, left foot: Secondary | ICD-10-CM | POA: Diagnosis not present

## 2020-06-04 DIAGNOSIS — G603 Idiopathic progressive neuropathy: Secondary | ICD-10-CM | POA: Diagnosis not present

## 2020-06-04 DIAGNOSIS — Z885 Allergy status to narcotic agent status: Secondary | ICD-10-CM | POA: Diagnosis not present

## 2020-06-04 DIAGNOSIS — L97422 Non-pressure chronic ulcer of left heel and midfoot with fat layer exposed: Secondary | ICD-10-CM | POA: Diagnosis not present

## 2020-06-04 DIAGNOSIS — M21371 Foot drop, right foot: Secondary | ICD-10-CM | POA: Diagnosis not present

## 2020-06-04 DIAGNOSIS — L97512 Non-pressure chronic ulcer of other part of right foot with fat layer exposed: Secondary | ICD-10-CM | POA: Diagnosis not present

## 2020-06-04 NOTE — Progress Notes (Addendum)
[name], [name] ([id number]) Visit Report for 06/04/2020 Arrival Information Details Patient Name: [name], [name] Date of Service: 06/04/2020 10:45 AM Medical Record Number: [id number] Patient Account Number: [id number] Date of Birth/Sex: 07-Mar-1936 (84 y.o. M) Treating Holt: Dolan Amen Primary Care Jessaca Philippi: [name] Other Clinician: Referring Jareb Radoncic: [name] Treating Adamarys Shall/Extender: Skipper Cliche in Treatment: 16 Visit Information History Since Last Visit Pain Present Now: No Patient Arrived: Walker Arrival Time: 10:40 Accompanied By: self Transfer Assistance: None Patient Identification Verified: Yes Secondary Verification Process Completed: Yes Patient Requires Transmission-Based Precautions: No Patient Has Alerts: No Electronic Signature(s) Signed: 06/04/2020 4:19:23 PM By: Georges Mouse, Minus Breeding Holt Entered By: Georges Mouse, Minus Breeding on 06/04/2020 10:41:02 [name] ([id number]) -------------------------------------------------------------------------------- Complex / Palliative Patient Assessment Details Patient Name: [name] Date of Service: 06/04/2020 10:45 AM Medical Record Number: [id number] Patient Account Number: [id number] Date of Birth/Sex: 19-Apr-1936 (84 y.o. M) Treating Holt: Cornell Barman Primary Care Juline Sanderford: [name] Other Clinician: Referring Calel Pisarski: [name] Treating Gera Inboden/Extender: Skipper Cliche in Treatment: 16 Palliative Management Criteria Complex Wound Management Criteria Patient requires a surgical procedure in order to achieve wound healing: foot drop However, the patient does not wish to undergo the recommended surgical procedure. Patient has remarkable or complex co-morbidities requiring medications or treatments that extend wound healing times. Examples: o Diabetes mellitus with chronic renal failure or end stage renal disease requiring dialysis o Advanced or poorly controlled  rheumatoid arthritis o Diabetes mellitus and end stage chronic obstructive pulmonary disease o Active cancer with current chemo- or radiation therapy Care Approach Wound Care Plan: Complex Wound Management Electronic Signature(s) Signed: 06/13/2020 5:39:22 PM By: Gretta Cool, BSN, Holt, CWS, Kim Holt, BSN Signed: 06/15/2020 2:13:21 PM By: Worthy Keeler PA-C Entered By: Gretta Cool, BSN, Holt, CWS, Kim on 06/13/2020 17:39:22 [name] ([id number]) -------------------------------------------------------------------------------- Encounter Discharge Information Details Patient Name: [name] Date of Service: 06/04/2020 10:45 AM Medical Record Number: [id number] Patient Account Number: [id number] Date of Birth/Sex: [birth year] (84 y.o. M) Treating Holt: Dolan Amen Primary Care Atalaya Zappia: [name] Other Clinician: Referring Allisha Harter: [name] Treating Casondra Gasca/Extender: Skipper Cliche in Treatment: 16 Encounter Discharge Information Items Post Procedure Vitals Discharge Condition: Stable Temperature (F): 98.1 Ambulatory Status: Walker Pulse (bpm): 101 Discharge Destination: Home Respiratory Rate (breaths/min): 18 Transportation: Private Auto Blood Pressure (mmHg): 155/82 Accompanied By: self Schedule Follow-up Appointment: Yes Clinical Summary of Care: Electronic Signature(s) Signed: 06/04/2020 4:19:23 PM By: Georges Mouse, Minus Breeding Holt Entered By: Georges Mouse, Minus Breeding on 06/04/2020 11:31:35 [name] ([id number]) -------------------------------------------------------------------------------- Lower Extremity Assessment Details Patient Name: [name] Date of Service: 06/04/2020 10:45 AM Medical Record Number: [id number] Patient Account Number: [id number] Date of Birth/Sex: [birth year] (84 y.o. M) Treating Holt: Dolan Amen Primary Care Lyan Moyano: [name] Other Clinician: Referring Waylin Dorko: [name] Treating Arhan Mcmanamon/Extender: Skipper Cliche in Treatment: 16 Edema Assessment Assessed: [name]: Yes] [name]: No] Edema: [Left: N] [Right: o] Calf Left: Right: Point of Measurement: 32 cm From Medial Instep 42 cm Ankle Left: Right: Point of Measurement: 12 cm From Medial Instep 28.2 cm Vascular Assessment Pulses: Dorsalis Pedis Palpable: [Left:Yes] Electronic Signature(s) Signed: 06/04/2020 4:19:23 PM By: Georges Mouse, Minus Breeding Holt Entered By: Georges Mouse, Minus Breeding on 06/04/2020 10:52:29 [name] ([id number]) -------------------------------------------------------------------------------- Multi Wound Chart Details Patient Name: [name] Date of Service: 06/04/2020 10:45 AM Medical Record Number: [id number] Patient Account Number: [id number] Date of Birth/Sex: 09-Nov-1935 (84 y.o. M) Treating Holt: Dolan Amen Primary Care Janeice Stegall: [name] Other Clinician: Referring Gricel Copen: [name] Treating Becki Mccaskill/Extender: Skipper Cliche in Treatment:  16 Vital Signs Height(in): 72 Pulse(bpm): 101 Weight(lbs): 200 Blood Pressure(mmHg): 155/82 Body Mass Index(BMI): 27 Temperature(F): 98.1 Respiratory Rate(breaths/min): 18 Photos: [N/A:N/A] Wound Location: Left, Plantar Calcaneus N/A N/A Wounding Event: Trauma N/A N/A Primary Etiology: Trauma, Other N/A N/A Secondary Etiology: Pressure Ulcer N/A N/A Comorbid History: Hypertension, Myocardial Infarction, N/A N/A Osteoarthritis, Neuropathy Date Acquired: 10/21/2019 N/A N/A Weeks of Treatment: 16 N/A N/A Wound Status: Open N/A N/A Measurements L x W x D (cm) 0.4x0.5x0.3 N/A N/A Area (cm) : 0.157 N/A N/A Volume (cm) : 0.047 N/A N/A % Reduction in Area: 19.90% N/A N/A % Reduction in Volume: 40.50% N/A N/A Starting Position 1 (o'clock): 10 Ending Position 1 (o'clock): 6 Maximum Distance 1 (cm): 0.3 Undermining: Yes N/A N/A Classification: Full Thickness Without Exposed N/A N/A Support Structures Exudate Amount: Medium N/A  N/A Exudate Type: Serous N/A N/A Exudate Color: amber N/A N/A Wound Margin: Flat and Intact N/A N/A Granulation Amount: Large (67-100%) N/A N/A Granulation Quality: Red N/A N/A Necrotic Amount: None Present (0%) N/A N/A Exposed Structures: Fat Layer (Subcutaneous Tissue): N/A N/A Yes Fascia: No Tendon: No Muscle: No Joint: No Bone: No Epithelialization: None N/A N/A Treatment Notes Electronic Signature(s) Signed: 06/04/2020 4:19:23 PM By: [name] Tallerico, Treston ([id number]) Entered By: Georges Mouse, Minus Breeding on 06/04/2020 11:11:20 [name] ([id number]) -------------------------------------------------------------------------------- Multi-Disciplinary Care Plan Details Patient Name: [name] Date of Service: 06/04/2020 10:45 AM Medical Record Number: [id number] Patient Account Number: [id number] Date of Birth/Sex: [birth year] (84 y.o. M) Treating Holt: Dolan Amen Primary Care Shigeko Manard: [name] Other Clinician: Referring Ronique Simerly: [name] Treating Kairon Shock/Extender: Skipper Cliche in Treatment: 16 Active Inactive Necrotic Tissue Nursing Diagnoses: Impaired tissue integrity related to necrotic/devitalized tissue Knowledge deficit related to management of necrotic/devitalized tissue Goals: Necrotic/devitalized tissue will be minimized in the wound bed Date Initiated: 04/14/2020 Target Resolution Date: 05/14/2020 Goal Status: Active Patient/caregiver will verbalize understanding of reason and process for debridement of necrotic tissue Date Initiated: 04/14/2020 Target Resolution Date: 05/14/2020 Goal Status: Active Interventions: Assess patient pain level pre-, during and post procedure and prior to discharge Provide education on necrotic tissue and debridement process Treatment Activities: Apply topical anesthetic as ordered : 04/14/2020 Excisional debridement : 04/14/2020 Notes: Wound/Skin Impairment Nursing  Diagnoses: Impaired tissue integrity Knowledge deficit related to ulceration/compromised skin integrity Goals: Patient/caregiver will verbalize understanding of skin care regimen Date Initiated: 02/11/2020 Date Inactivated: 05/05/2020 Target Resolution Date: 02/24/2020 Goal Status: Met Ulcer/skin breakdown will have a volume reduction of 80% by week 12 Date Initiated: 05/05/2020 Target Resolution Date: 05/05/2020 Goal Status: Active Ulcer/skin breakdown will heal within 14 weeks Date Initiated: 05/05/2020 Target Resolution Date: 05/19/2020 Goal Status: Active Interventions: Assess patient/caregiver ability to obtain necessary supplies Assess patient/caregiver ability to perform ulcer/skin care regimen upon admission and as needed Assess ulceration(s) every visit Provide education on ulcer and skin care Treatment Activities: Skin care regimen initiated : 02/11/2020 Notes: RONY, RATZ ([id number]) Electronic Signature(s) Signed: 06/04/2020 4:19:23 PM By: Georges Mouse, Minus Breeding Holt Entered By: Georges Mouse, Minus Breeding on 06/04/2020 11:10:18 [name] ([id number]) -------------------------------------------------------------------------------- Pain Assessment Details Patient Name: [name] Date of Service: 06/04/2020 10:45 AM Medical Record Number: [id number] Patient Account Number: [id number] Date of Birth/Sex: [birth year] (84 y.o. M) Treating Holt: Dolan Amen Primary Care Davidlee Jeanbaptiste: [name] Other Clinician: Referring Iridessa Harrow: [name] Treating Tarri Guilfoil/Extender: Skipper Cliche in Treatment: 16 Active Problems Location of Pain Severity and Description of Pain Patient Has Paino No Site Locations Rate the pain. Current Pain Level: 0 Pain Management and  Medication Current Pain Management: Electronic Signature(s) Signed: 06/04/2020 4:19:23 PM By: Georges Mouse, Minus Breeding Holt Entered By: Georges Mouse, Kenia on 06/04/2020 10:44:43 Weible, [name]  ([id number]) -------------------------------------------------------------------------------- Wound Assessment Details Patient Name: [name] Date of Service: 06/04/2020 10:45 AM Medical Record Number: [id number] Patient Account Number: [id number] Date of Birth/Sex: 06-Aug-1935 (84 y.o. M) Treating Holt: Dolan Amen Primary Care Aftin Lye: [name] Other Clinician: Referring Makoa Satz: [name] Treating Cookie Pore/Extender: Skipper Cliche in Treatment: 16 Wound Status Wound Number: 1 Primary Etiology: Trauma, Other Wound Location: Left, Plantar Calcaneus Secondary Pressure Ulcer Etiology: Wounding Event: Trauma Wound Status: Open Date Acquired: 10/21/2019 Comorbid Hypertension, Myocardial Infarction, Osteoarthritis, Weeks Of Treatment: 16 History: Neuropathy Clustered Wound: No Photos Wound Measurements Length: (cm) 0.4 Width: (cm) 0.5 Depth: (cm) 0.3 Area: (cm) 0.157 Volume: (cm) 0.047 % Reduction in Area: 19.9% % Reduction in Volume: 40.5% Epithelialization: None Tunneling: No Undermining: Yes Starting Position (o'clock): 10 Ending Position (o'clock): 6 Maximum Distance: (cm) 0.3 Wound Description Classification: Full Thickness Without Exposed Support Structures Wound Margin: Flat and Intact Exudate Amount: Medium Exudate Type: Serous Exudate Color: amber Foul Odor After Cleansing: No Slough/Fibrino Yes Wound Bed Granulation Amount: Large (67-100%) Exposed Structure Granulation Quality: Red Fascia Exposed: No Necrotic Amount: None Present (0%) Fat Layer (Subcutaneous Tissue) Exposed: Yes Tendon Exposed: No Muscle Exposed: No Joint Exposed: No Bone Exposed: No Treatment Notes Wound #1 (Left, Plantar Calcaneus) Liggett, Claudia ([id number]) Notes endoform moisten with saline, foam secured with tubi grip Electronic Signature(s) Signed: 06/04/2020 1:37:31 PM By: Georges Mouse, Minus Breeding Holt Entered By: Georges Mouse, Minus Breeding on 06/04/2020  13:37:30 Stump, [name] ([id number]) -------------------------------------------------------------------------------- Vitals Details Patient Name: [name] Date of Service: 06/04/2020 10:45 AM Medical Record Number: [id number] Patient Account Number: [id number] Date of Birth/Sex: 1935/09/02 (84 y.o. M) Treating Holt: Dolan Amen Primary Care Carollyn Etcheverry: [name] Other Clinician: Referring Makye Radle: [name] Treating Francie Keeling/Extender: Skipper Cliche in Treatment: 16 Vital Signs Time Taken: 10:41 Temperature (F): 98.1 Height (in): 72 Pulse (bpm): 101 Weight (lbs): 200 Respiratory Rate (breaths/min): 18 Body Mass Index (BMI): 27.1 Blood Pressure (mmHg): 155/82 Reference Range: 80 - 120 mg / dl Electronic Signature(s) Signed: 06/04/2020 4:19:23 PM By: Georges Mouse, Minus Breeding Holt Entered By: Georges Mouse, Minus Breeding on 06/04/2020 10:42:30

## 2020-06-04 NOTE — Progress Notes (Addendum)
KINARD, FATIMA ([id number]) Visit Report for 06/04/2020 Chief Complaint Document Details Patient Name: [name], [name] Date of Service: 06/04/2020 10:45 AM Medical Record Number: [id number] Patient Account Number: [id number] Date of Birth/Sex: Dec 30, 1935 (84 y.o. M) Treating RN: Cornell Barman Primary Care Provider: [name] Other Clinician: Referring Provider: [name] Treating Provider/Extender: Skipper Cliche in Treatment: 16 Information Obtained from: Patient Chief Complaint Pressure ulcer left heel Electronic Signature(s) Signed: 06/04/2020 10:37:14 AM By: Worthy Keeler PA-C Entered By: Worthy Keeler on 06/04/2020 10:37:14 [name], [name] ([id number]) -------------------------------------------------------------------------------- Debridement Details Patient Name: [name] Date of Service: 06/04/2020 10:45 AM Medical Record Number: [id number] Patient Account Number: [id number] Date of Birth/Sex: November 16, 1935 (84 y.o. M) Treating RN: Dolan Amen Primary Care Provider: [name] Other Clinician: Referring Provider: [name] Treating Provider/Extender: Skipper Cliche in Treatment: 16 Debridement Performed for Wound #1 Left,Plantar Calcaneus Assessment: Performed By: Physician Tommie Sams., PA-C Debridement Type: Debridement Level of Consciousness (Pre- Awake and Alert procedure): Pre-procedure Verification/Time Out Yes - 11:11 Taken: Start Time: 11:11 Total Area Debrided (L x W): 0.5 (cm) x 2 (cm) = 1 (cm) Tissue and other material Viable, Non-Viable, Callus, Slough, Subcutaneous, Slough debrided: Level: Skin/Subcutaneous Tissue Debridement Description: Excisional Instrument: Curette Bleeding: Minimum Hemostasis Achieved: Pressure End Time: 11:14 Response to Treatment: Procedure was tolerated well Level of Consciousness (Post- Awake and Alert procedure): Post Debridement Measurements of Total Wound Length: (cm)  0.4 Width: (cm) 2 Depth: (cm) 0.3 Volume: (cm) 0.188 Character of Wound/Ulcer Post Debridement: Stable Post Procedure Diagnosis Same as Pre-procedure Electronic Signature(s) Signed: 06/04/2020 4:19:23 PM By: Charlett Nose RN Signed: 06/04/2020 4:46:48 PM By: Worthy Keeler PA-C Entered By: Georges Mouse, Minus Breeding on 06/04/2020 11:15:55 [name] ([id number]) -------------------------------------------------------------------------------- HPI Details Patient Name: [name] Date of Service: 06/04/2020 10:45 AM Medical Record Number: [id number] Patient Account Number: [id number] Date of Birth/Sex: Mar 25, 1936 (84 y.o. M) Treating RN: Cornell Barman Primary Care Provider: [name] Other Clinician: Referring Provider: [name] Treating Provider/Extender: Skipper Cliche in Treatment: 16 History of Present Illness HPI Description: 02/11/2020 on evaluation today patient appears to be doing somewhat poorly on initial evaluation concerning his heel. He has been using a topical antibiotic ointment which does not seem to really have been doing the best job for him. He tells me that he had a wound that occurred as a result of his padding in the bottom of his shoe wear his foot drop brace goes actually had worn through and his foot was actually rubbing on the bottom of the shoe on the brace itself. Subsequently this ended up with a wound and he in turn had a lot of issues following. Fortunately there is no signs of active infection at this time which is good news in my opinion. With that being said the wound does appear to be somewhat moist with some evidence of maceration unfortunately. He has no pain he does have neuropathy. He also has bilateral foot drop which she wears braces. This does appear to be however a pressure injury secondary to the AFO brace. The patient has been seen by podiatry since May 2021 unfortunately this is not healing hence the reason that  he is coming to see [place] at this point to see if there is anything we can do to help in this regard. 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 9/15; the patient missed his appointment last week  due to illness with his wife who had to be hospitalized. We are using Hydrofera Blue to the small punched out area in the middle of the left plantar heel. 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 This is felt to be pressure from the brace itself. 03/05/2020 upon evaluation today patient appears to be doing well in regard to his wound on the heel. He has been tolerating the dressing changes without complication which is great news. Fortunately there is no signs of active infection which I am very happy with. 03/19/2020 upon evaluation today patient appears to be doing a little worse in regard to his heel based on what I am seeing at this point. There actually appears to be a pocket off to the side laterally that is problematic for him at this point. I am not to clear this way with some sharp debridement currently. The patient was in agreement with that plan. 10/20; patient I do not usually see. He has a small wound on the left plantar heel. Some depth. He is apparently completing antibiotics. He has bilateral foot drop and therefore is AFO braces limiting what we can do to offload this. 04/14/2020 upon evaluation today patient's heel actually appears to be doing fairly well. There is no signs of active infection at this time which is great news and overall I feel like he is managing nicely. The only issue he has is that the individual who has been helping change the dressings at home unfortunately has not been available for the past week or so. She is out of town. Nonetheless he has been going to urgent care to have the dressings changed as he is not able to do this on his own and states that his  wife is unable to do it for him. 11/10; patient in today for a nurse visit but I was asked to look at him. I note that we are treating him for a small punched-out area on the left plantar heel. He arrived with a new superficial area close to this wound I am not really clear of the exact etiology of this. The bigger problem was increased swelling warmth of the lower leg below the knee but intense erythema involving the entire anterior knee and medial thigh. His entire leg is warm and swollen. However it was not completely obvious to me that there was a big change in the lower leg however the nurses and the patient certainly thought there was. The patient stated that after his 2-week of trimethoprim sulfamethoxazole his legs looked very similar. His vital signs are stable he is afebrile but nevertheless if this is all infection this is extensive and failing outpatient management 04/28/2020 on evaluation today patient presents for follow-up after having been seen in the ER and admitted to the hospital due to cellulitis in the left knee. He has continued to have issues at this point with regard to his knee it still very hot to touch upon evaluation today. His heel ulcer is actually measuring smaller than the last time I saw him but he still is having a lot of difficulty with dry and flaky skin over his feet in general. 05/05/2020 on evaluation today patient appears to be doing pretty well in regard to his heel ulcer. This is measuring a little smaller which is good news. Fortunately there is no signs of active infection at this time. No fevers, chills, nausea, vomiting, or diarrhea. He does have a new area on his right dorsal  surface of his foot. With that being said this region we really do not know exactly what happened where this came from it appears to be somewhat abnormal in that there is a very well demarcated line that almost looks as if he could have had a burn or something of the sort that was  partially protected 1 area but got burned over a different region. Either way I think we can probably get this healed fairly quickly. 05/14/2020 on evaluation today patient appears to be doing about the same in regard to his heel ulcer. We may need to try something different to see if this will be beneficial for him. I am thinking of switching him to collagen. 05/21/2020 on evaluation today patient appears to be doing excellent in regard to his foot ulcer from the standpoint of the overall appearance with that being said were just having some trouble getting it to granulate in. I do believe he would benefit from potentially utilizing a skin substitute though at this point in time at the end of the year I think it may be best to hold off till next year if we need to do this but I am going to switch things out a little bit regular try endoform to see if this will be beneficial for him. 05/28/2020 upon evaluation today patient's wound actually appears to be doing fairly well is very healthy and I am pleased with the overall appearance in that regard. With that being said I do not see any signs of active infection at this time which is great news. No fevers, chills, nausea, vomiting, or diarrhea. Unfortunately the adhesive is sticking to his skin around the wound and causing some minor skin tearing. We need to avoid this if at all possible. 06/04/2020 upon evaluation today patient appears to be doing really about the same in regard to his wound. With that being said there was some callus around the edges of the wound that I did decide to go ahead and tried to clean away and the patient actually tolerated me doing that today. I described what was found in the physical exam/assessment portion of this note Electronic Signature(s) Signed: 06/04/2020 2:08:01 PM By: Gaylyn Rong, Fernandez ([id number]) Entered By: Lenda Kelp on 06/04/2020 14:08:00 [name]  ([id number]) -------------------------------------------------------------------------------- Physical Exam Details Patient Name: [name] Date of Service: 06/04/2020 10:45 AM Medical Record Number: [id number] Patient Account Number: [id number] Date of Birth/Sex: [birth year] (84 y.o. M) Treating RN: Huel Coventry Primary Care Provider: Saralyn Pilar Other Clinician: Referring Provider: Saralyn Pilar Treating Provider/Extender: Rowan Blase in Treatment: 16 Constitutional Well-nourished and well-hydrated in no acute distress. Respiratory normal breathing without difficulty. Psychiatric this patient is able to make decisions and demonstrates good insight into disease process. Alert and Oriented x 3. pleasant and cooperative. Notes Upon inspection as I begin to clear away the callus around the edges of the patient's wound unfortunately I uncovered a small sinus tract/tunnel that went laterally into an area underneath the callus where the patient actually has a second ulceration similar to the first. Unfortunately I think this is causing part of the issue with trying to get this to heal appropriately is not healing as well because its been draining from this area. Working to see how things do now that we had this opened and can treated appropriately hopefully this will speed up his healing process in general. Electronic Signature(s) Signed: 06/04/2020 2:08:45 PM By: Lenda Kelp PA-C Entered By: Linwood Dibbles,  Kathleen Likins on 06/04/2020 14:08:45 [name], [name] ([id number]) -------------------------------------------------------------------------------- Physician Orders Details Patient Name: [name] Date of Service: 06/04/2020 10:45 AM Medical Record Number: [id number] Patient Account Number: [id number] Date of Birth/Sex: [birth year] (84 y.o. 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 Follow-up Appointments Wound #1 Left,Plantar Calcaneus o Return Appointment in 2 weeks. o Nurse Visit as needed Edema Control Wound #1 Left,Plantar Calcaneus o Other: - tubi grip Off-Loading Wound #1 Left,Plantar Calcaneus o Other: - keep pressure off of heel Electronic Signature(s) Signed: 06/04/2020 4:19:23 PM By: Georges Mouse, Minus Breeding RN Signed: 06/04/2020 4:46:48 PM By: Worthy Keeler PA-C Entered By: Georges Mouse, Minus Breeding on 06/04/2020 11:29:13 [name] ([id number]) -------------------------------------------------------------------------------- Problem List Details Patient Name: [name] Date of Service: 06/04/2020 10:45 AM Medical Record Number: [id number] Patient Account Number: [id number] Date of Birth/Sex: 1935/07/13 (84 y.o. 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 02/11/2020 No Yes M21.372 Foot drop, left foot 02/11/2020 No Yes M21.371 Foot drop, right foot 02/11/2020 No Yes L03.116 Cellulitis of left lower limb 04/22/2020 No Yes Inactive Problems Resolved Problems Electronic Signature(s) Signed: 06/04/2020 10:37:08 AM By: Worthy Keeler PA-C Entered By: Worthy Keeler on 06/04/2020 10:37:08 [name], [name] ([id number]) -------------------------------------------------------------------------------- Progress Note Details Patient Name: [name] Date of Service: 06/04/2020 10:45 AM Medical Record Number: [id number] Patient Account Number: [id number] Date of Birth/Sex: [birth year] (84 y.o. M) Treating RN: Cornell Barman Primary Care Provider: [name] Other Clinician: Referring Provider: [name] Treating Provider/Extender: Skipper Cliche in Treatment: 16 Subjective Chief Complaint Information obtained from Patient Pressure ulcer left heel History of Present Illness (HPI) 02/11/2020 on evaluation today patient appears to be doing somewhat poorly on initial evaluation concerning his heel. He has been using a topical antibiotic ointment which does not seem to really have been doing the best job for him. He tells me that he had a wound that occurred as a result of his padding in the bottom of his shoe wear his foot drop brace goes actually had worn through and his foot was actually rubbing on the bottom of the shoe on the brace itself. Subsequently this ended up with a wound and he in turn had a lot of issues following. Fortunately there is no signs of active infection at this time which is good news in my opinion. With that being said the wound does appear to be somewhat moist with some evidence of maceration unfortunately. He has no pain he  does have neuropathy. He also has bilateral foot drop  which she wears braces. This does appear to be however a pressure injury secondary to the AFO brace. The patient has been seen by podiatry since May 2021 unfortunately this is not healing hence the reason that he is coming to see [place] at this point to see if there is anything we can do to help in this regard. 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 9/15; the patient missed his appointment last week due to illness with his wife who had to be hospitalized. We are using Hydrofera Blue to the small punched out area in the middle of the left plantar heel. 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 This is felt to be pressure from the brace itself. 03/05/2020 upon evaluation today patient appears to be doing well in regard to his wound on the heel. He has been tolerating the dressing changes without complication which is great news. Fortunately there is no signs of active infection which I am very happy with. 03/19/2020 upon evaluation today patient appears to be doing a little worse in regard to his heel based on what I am seeing at this point. There actually appears to be a pocket off to the side laterally that is problematic for him at this point. I am not to clear this way with some sharp debridement currently. The patient was in agreement with that plan. 10/20; patient I do not usually see. He has a small wound on the left plantar heel. Some depth. He is apparently completing antibiotics. He has bilateral foot drop and therefore is AFO braces limiting what we can do to offload this. 04/14/2020 upon evaluation today patient's heel actually appears to be doing fairly well. There is no signs of active infection at this time which is great news and overall I feel like he is managing nicely. The only issue he has is that the individual who has been helping change the dressings at home  unfortunately has not been available for the past week or so. She is out of town. Nonetheless he has been going to urgent care to have the dressings changed as he is not able to do this on his own and states that his wife is unable to do it for him. 11/10; patient in today for a nurse visit but I was asked to look at him. I note that we are treating him for a small punched-out area on the left plantar heel. He arrived with a new superficial area close to this wound I am not really clear of the exact etiology of this. The bigger problem was increased swelling warmth of the lower leg below the knee but intense erythema involving the entire anterior knee and medial thigh. His entire leg is warm and swollen. However it was not completely obvious to me that there was a big change in the lower leg however the nurses and the patient certainly thought there was. The patient stated that after his 2-week of trimethoprim sulfamethoxazole his legs looked very similar. His vital signs are stable he is afebrile but nevertheless if this is all infection this is extensive and failing outpatient management 04/28/2020 on evaluation today patient presents for follow-up after having been seen in the ER and admitted to the hospital due to cellulitis in the left knee. He has continued to have issues at this point  with regard to his knee it still very hot to touch upon evaluation today. His heel ulcer is actually measuring smaller than the last time I saw him but he still is having a lot of difficulty with dry and flaky skin over his feet in general. 05/05/2020 on evaluation today patient appears to be doing pretty well in regard to his heel ulcer. This is measuring a little smaller which is good news. Fortunately there is no signs of active infection at this time. No fevers, chills, nausea, vomiting, or diarrhea. He does have a new area on his right dorsal surface of his foot. With that being said this region we really do  not know exactly what happened where this came from it appears to be somewhat abnormal in that there is a very well demarcated line that almost looks as if he could have had a burn or something of the sort that was partially protected 1 area but got burned over a different region. Either way I think we can probably get this healed fairly quickly. 05/14/2020 on evaluation today patient appears to be doing about the same in regard to his heel ulcer. We may need to try something different to see if this will be beneficial for him. I am thinking of switching him to collagen. 05/21/2020 on evaluation today patient appears to be doing excellent in regard to his foot ulcer from the standpoint of the overall appearance with that being said were just having some trouble getting it to granulate in. I do believe he would benefit from potentially utilizing a skin substitute though at this point in time at the end of the year I think it may be best to hold off till next year if we need to do this but I am going to switch things out a little bit regular try endoform to see if this will be beneficial for him. 05/28/2020 upon evaluation today patient's wound actually appears to be doing fairly well is very healthy and I am pleased with the overall appearance in that regard. With that being said I do not see any signs of active infection at this time which is great news. No fevers, chills, nausea, vomiting, or diarrhea. Unfortunately the adhesive is sticking to his skin around the wound and causing some minor skin tearing. We need to avoid this if at all possible. 06/04/2020 upon evaluation today patient appears to be doing really about the same in regard to his wound. With that being said there was some callus around the edges of the wound that I did decide to go ahead and tried to clean away and the patient actually tolerated me doing that today. I described what was found in the physical exam/assessment portion of  this note [name], [name] ([id number]) Objective Constitutional Well-nourished and well-hydrated in no acute distress. Vitals Time Taken: 10:41 AM, Height: 72 in, Weight: 200 lbs, BMI: 27.1, Temperature: 98.1 F, Pulse: 101 bpm, Respiratory Rate: 18 breaths/min, Blood Pressure: 155/82 mmHg. Respiratory normal breathing without difficulty. Psychiatric this patient is able to make decisions and demonstrates good insight into disease process. Alert and Oriented x 3. pleasant and cooperative. General Notes: Upon inspection as I begin to clear away the callus around the edges of the patient's wound unfortunately I uncovered a small sinus tract/tunnel that went laterally into an area underneath the callus where the patient actually has a second ulceration similar to the first. Unfortunately I think this is causing part of the issue with trying to get  this to heal appropriately is not healing as well because its been draining from this area. Working to see how things do now that we had this opened and can treated appropriately hopefully this will speed up his healing process in general. Integumentary (Hair, Skin) Wound #1 status is Open. Original cause of wound was Trauma. The wound is located on the Left,Plantar Calcaneus. The wound measures 0.4cm length x 0.5cm width x 0.3cm depth; 0.157cm^2 area and 0.047cm^3 volume. There is Fat Layer (Subcutaneous Tissue) exposed. There is no tunneling noted, however, there is undermining starting at 10:00 and ending at 6:00 with a maximum distance of 0.3cm. There is a medium amount of serous drainage noted. The wound margin is flat and intact. There is large (67-100%) red granulation within the wound bed. There is no necrotic tissue within the wound bed. 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 There was a Excisional Skin/Subcutaneous Tissue Debridement with a total area of 1 sq cm performed by Tommie Sams., PA-C. With the following instrument(s): Curette to remove Viable and Non-Viable tissue/material. Material removed includes Callus, Subcutaneous Tissue, and Slough. A time out was conducted at 11:11, prior to the start of the procedure. A Minimum amount of bleeding was controlled with Pressure. The procedure was tolerated well. Post Debridement Measurements: 0.4cm length x 2cm width x 0.3cm depth; 0.188cm^3 volume. Character of Wound/Ulcer Post Debridement is stable. Post procedure Diagnosis Wound #1: Same as Pre-Procedure [name], [name] ([id number]) Plan Skin Barriers/Peri-Wound Care: Wound #1 Left,Plantar Calcaneus: Moisturizing lotion - Foot cream Primary Wound Dressing: Wound #1 Left,Plantar Calcaneus: Other: - endoform moisten with saline Secondary Dressing: Wound #1 Left,Plantar Calcaneus: Foam - secured with tubi grip Dressing Change Frequency: Wound #1 Left,Plantar Calcaneus: Change dressing every other day. Follow-up Appointments: Wound #1 Left,Plantar Calcaneus: Return Appointment in 2 weeks. Nurse Visit as needed Edema Control: Wound #1 Left,Plantar Calcaneus: Other: - tubi grip Off-Loading: Wound #1 Left,Plantar Calcaneus: Other: - keep pressure off of heel Would recommend that we continue with wound care measures as before and the patient is in agreement the plan with the silver collagen I think that still the best way to go. 2. We will still continue to pad this in order to attempt to offload. 3. I am also can recommend that we use roll gauze to secure in place and then Tubigrip to help with lower extremity edema apparently has been having some swelling of his left leg and hopefully the Tubigrip  may help in that regard. We will see patient back for reevaluation in 1 week here in the clinic. If anything worsens or changes patient will contact our office for additional recommendations. Electronic Signature(s) Signed: 06/04/2020 2:09:07 PM By: Worthy Keeler PA-C Entered By: Worthy Keeler on 06/04/2020 14:09:06 [name], [name] ([id number]) -------------------------------------------------------------------------------- SuperBill Details Patient Name: [name] Date of Service: 06/04/2020 Medical Record Number: [id number] Patient Account Number: [id number] Date of Birth/Sex: 25-Sep-1935 (84 y.o. M) Treating RN: Cornell Barman Primary Care Provider: [name] Other Clinician: Referring Provider: [name] Treating Provider/Extender: Skipper Cliche in Treatment: 16 Diagnosis Coding ICD-10 Codes Code Description [phone] Pressure ulcer of left heel, stage 3 L97.512 Non-pressure chronic ulcer of other part of right  foot with fat layer exposed G60.3 Idiopathic progressive neuropathy M21.372 Foot drop, left foot M21.371 Foot drop, right foot L03.116 Cellulitis of left lower limb Facility Procedures CPT4 Code: 10175102 Description: 58527 - DEB SUBQ TISSUE 20 SQ CM/< Modifier: Quantity: 1 CPT4 Code: Description: ICD-10 Diagnosis Description L89.623 Pressure ulcer of left heel, stage 3 Modifier: Quantity: Physician Procedures CPT4 Code: 7824235 Description: 36144 - WC PHYS SUBQ TISS 20 SQ CM Modifier: Quantity: 1 CPT4 Code: Description: ICD-10 Diagnosis Description L89.623 Pressure ulcer of left heel, stage 3 Modifier: Quantity: Electronic Signature(s) Signed: 06/04/2020 2:09:24 PM By: Worthy Keeler PA-C Entered By: Worthy Keeler on 06/04/2020 14:09:23

## 2020-06-11 ENCOUNTER — Other Ambulatory Visit: Payer: Self-pay

## 2020-06-11 DIAGNOSIS — L97512 Non-pressure chronic ulcer of other part of right foot with fat layer exposed: Secondary | ICD-10-CM | POA: Diagnosis not present

## 2020-06-11 DIAGNOSIS — L89623 Pressure ulcer of left heel, stage 3: Secondary | ICD-10-CM | POA: Diagnosis not present

## 2020-06-11 DIAGNOSIS — Z885 Allergy status to narcotic agent status: Secondary | ICD-10-CM | POA: Diagnosis not present

## 2020-06-11 DIAGNOSIS — M21371 Foot drop, right foot: Secondary | ICD-10-CM | POA: Diagnosis not present

## 2020-06-11 DIAGNOSIS — G603 Idiopathic progressive neuropathy: Secondary | ICD-10-CM | POA: Diagnosis not present

## 2020-06-11 DIAGNOSIS — M21372 Foot drop, left foot: Secondary | ICD-10-CM | POA: Diagnosis not present

## 2020-06-11 NOTE — Progress Notes (Addendum)
RAMIZ, SPANNAGEL ([id number]) Visit Report for 06/11/2020 Arrival Information Details Patient Name: [name], [name] Date of Service: 06/11/2020 10:30 AM Medical Record Number: [id number] Patient Account Number: [id number] Date of Birth/Sex: Nov 29, 1935 (84 y.o. M) Treating RN: Army Melia Primary Care Trinidi Toppins: [name] Other Clinician: Referring Catlynn Grondahl: [name] Treating Jyrah Blye/Extender: Tito Dine in Treatment: 21 Visit Information History Since Last Visit Added or deleted any medications: No Patient Arrived: Walker Any new allergies or adverse reactions: No Arrival Time: 10:46 Had a fall or experienced change in No Accompanied By: self activities of daily living that may affect Transfer Assistance: None risk of falls: Patient Identification Verified: Yes Signs or symptoms of abuse/neglect since last visito No Patient Requires Transmission-Based Precautions: No Hospitalized since last visit: No Patient Has Alerts: No Has Dressing in Place as Prescribed: Yes Pain Present Now: No Electronic Signature(s) Signed: 06/11/2020 8:30:47 PM By: Gretta Cool, BSN, RN, CWS, Kim RN, BSN Previous Signature: 06/11/2020 10:47:02 AM Version By: Army Melia Entered By: Gretta Cool BSN, RN, CWS, Kim on 06/11/2020 20:30:46 [name] ([id number]) -------------------------------------------------------------------------------- Clinic Level of Care Assessment Details Patient Name: [name] Date of Service: 06/11/2020 10:30 AM Medical Record Number: [id number] Patient Account Number: [id number] Date of Birth/Sex: 05-Jan-1936 (84 y.o. M) Treating RN: Army Melia Primary Care Jaylynn Mcaleer: [name] Other Clinician: Referring Taison Celani: [name] Treating Gladis Soley/Extender: Tito Dine in Treatment: 17 Clinic Level of Care Assessment Items TOOL 4 Quantity Score []  - Use when only an EandM is performed on FOLLOW-UP visit 0 ASSESSMENTS -  Nursing Assessment / Reassessment X - Reassessment of Co-morbidities (includes updates in patient status) 1 10 X- 1 5 Reassessment of Adherence to Treatment Plan ASSESSMENTS - Wound and Skin Assessment / Reassessment X - Simple Wound Assessment / Reassessment - one wound 1 5 []  - 0 Complex Wound Assessment / Reassessment - multiple wounds []  - 0 Dermatologic / Skin Assessment (not related to wound area) ASSESSMENTS - Focused Assessment []  - Circumferential Edema Measurements - multi extremities 0 []  - 0 Nutritional Assessment / Counseling / Intervention []  - 0 Lower Extremity Assessment (monofilament, tuning fork, pulses) []  - 0 Peripheral Arterial Disease Assessment (using hand held doppler) ASSESSMENTS - Ostomy and/or Continence Assessment and Care []  - Incontinence Assessment and Management 0 []  - 0 Ostomy Care Assessment and Management (repouching, etc.) PROCESS - Coordination of Care X - Simple Patient / Family Education for ongoing care 1 15 []  - 0 Complex (extensive) Patient / Family Education for ongoing care []  - 0 Staff obtains Programmer, systems, Records, Test Results / Process Orders []  - 0 Staff telephones HHA, Nursing Homes / Clarify orders / etc []  - 0 Routine Transfer to another Facility (non-emergent condition) []  - 0 Routine Hospital Admission (non-emergent condition) []  - 0 New Admissions / Biomedical engineer / Ordering NPWT, Apligraf, etc. []  - 0 Emergency Hospital Admission (emergent condition) X- 1 10 Simple Discharge Coordination []  - 0 Complex (extensive) Discharge Coordination PROCESS - Special Needs []  - Pediatric / Minor Patient Management 0 []  - 0 Isolation Patient Management []  - 0 Hearing / Language / Visual special needs []  - 0 Assessment of Community assistance (transportation, D/C planning, etc.) []  - 0 Additional assistance / Altered mentation X- 1 15 Support Surface(s) Assessment (bed, cushion, seat, etc.) INTERVENTIONS - Wound  Cleansing / Measurement [name], [name] ([id number]) []  - 0 Simple Wound Cleansing - one wound []  - 0 Complex Wound Cleansing - multiple wounds X- 1 5 Wound Imaging (photographs - any number  of wounds) []  - 0 Wound Tracing (instead of photographs) X- 1 5 Simple Wound Measurement - one wound []  - 0 Complex Wound Measurement - multiple wounds INTERVENTIONS - Wound Dressings []  - Small Wound Dressing one or multiple wounds 0 X- 1 15 Medium Wound Dressing one or multiple wounds []  - 0 Large Wound Dressing one or multiple wounds []  - 0 Application of Medications - topical []  - 0 Application of Medications - injection INTERVENTIONS - Miscellaneous []  - External ear exam 0 []  - 0 Specimen Collection (cultures, biopsies, blood, body fluids, etc.) []  - 0 Specimen(s) / Culture(s) sent or taken to Lab for analysis []  - 0 Patient Transfer (multiple staff / / Similar devices) []  - 0 Simple Staple / Suture removal (25 or less) []  - 0 Complex Staple / Suture removal (26 or more) []  - 0 Hypo / Hyperglycemic Management (close monitor of Blood Glucose) []  - 0 Ankle / Brachial Index (ABI) - do not check if billed separately []  - 0 Vital Signs Has the patient been seen at the hospital within the last three years: Yes Total Score: 85 Level Of Care: New/Established - Level 3 Electronic Signature(s) Unsigned Previous Signature: 06/11/2020 11:28:50 AM Version By: Entered By: , BSN, RN, CWS, Kim on 06/11/2020 20:31:48 Signature(s): ( ) Date(s): -------------------------------------------------------------------------------- Encounter Discharge Information Details Patient Name: Date of Service: 06/11/2020 10:30 AM Medical Record Number: Patient Account Number: Nurse, adult Date of Birth/Sex: 01-24-1936 (84 y.o. M) Treating RN: Primary Care Aamari West: Other Clinician: Referring Nadiya Pieratt:  Treating Raena Pau/Extender: 06/13/2020 in Treatment: 17 Encounter Discharge Information Items Discharge Condition: Stable Ambulatory Status: Walker Discharge Destination: Home Transportation: Private Auto Accompanied By: self Schedule Follow-up Appointment: Yes Clinical Summary of Care: Electronic Signature(s) Signed: 06/11/2020 8:31:37 PM By: 06/13/2020, BSN, RN, CWS, Kim RN, BSN Previous Signature: 06/11/2020 10:47:51 AM Version By: [id number] Entered By: Marcina Millard BSN, RN, CWS, Kim on 06/11/2020 20:31:36 [id number] ([id number]) -------------------------------------------------------------------------------- Wound Assessment Details Patient Name: [name] Date of Service: 06/11/2020 10:30 AM Medical Record Number: Rodell Perna Patient Account Number: Saralyn Pilar Date of Birth/Sex: [name] (84 y.o. M) Treating RN: 18 Primary Care [place]: 06/13/2020 Other Clinician: Referring Maripat Borba: Elliot Gurney Treating Breyonna Nault/Extender: 06/13/2020 in Treatment: 17 Wound Status Wound Number: 1 Primary Etiology: Trauma, Other Wound Location: Left, Plantar Calcaneus Secondary Etiology: Pressure Ulcer Wounding Event: Trauma Wound Status: Open Date Acquired: 10/21/2019 Weeks Of Treatment: 17 Clustered Wound: No Wound Measurements Length: (cm) 0.4 Width: (cm) 0.5 Depth: (cm) 0.2 Area: (cm) 0.157 Volume: (cm) 0.031 % Reduction in Area: 19.9% % Reduction in Volume: 60.8% Wound Description Classification: Full Thickness Without Exposed Support Structure s Treatment Notes Wound #1 (Left, Plantar Calcaneus) Notes endoform moisten with saline, foam secured with tubi grip Electronic Signature(s) Signed: 06/11/2020 11:28:50 AM By: 06/13/2020 Entered By: Marcina Millard on 06/11/2020 10:47:11

## 2020-06-13 ENCOUNTER — Ambulatory Visit
Admission: EM | Admit: 2020-06-13 | Discharge: 2020-06-13 | Disposition: A | Discharge status: Home / Self Care | Payer: Medicare Other

## 2020-06-13 ENCOUNTER — Other Ambulatory Visit: Payer: Self-pay

## 2020-06-13 DIAGNOSIS — E13621 Other specified diabetes mellitus with foot ulcer: Secondary | ICD-10-CM | POA: Diagnosis not present

## 2020-06-13 DIAGNOSIS — L97521 Non-pressure chronic ulcer of other part of left foot limited to breakdown of skin: Secondary | ICD-10-CM | POA: Diagnosis not present

## 2020-06-13 NOTE — Discharge Instructions (Addendum)
Follow-up with home health as previously scheduled.

## 2020-06-13 NOTE — ED Provider Notes (Signed)
[facility]    CSN: [id number] Arrival date & time: 06/13/20  1033      History   Chief Complaint Chief Complaint  Patient presents with  . Foot Pain  . Dressing Change    HPI [name] is a 85 y.o. male.   HPI   85 year old male here for dressing change to his left heel.  Patient has been receiving wound care on a ulcer to his left heel on the plantar surface and has been receiving dressing changes through home health.  Patient has 2 home health nurses and both of them are off the entire weekend and he needs a dressing change.    Past Medical History:  Diagnosis Date  . Arthritis   . Benign essential tremor   . Fracture, femur ([place]) 2012  . GERD (gastroesophageal reflux disease)   . H/O echocardiogram    05/2013  . Hx of colonoscopy    04/22/2013  . Hypercholesterolemia   . Hypertension   . Myocardial infarction ([place])    1987  . Polyneuropathy   . Prostate cancer ([place]) 2004  . Superficial hematoma     Patient Active Problem List   Diagnosis Date Noted  . Sepsis ([place]) 04/24/2020  . Cellulitis of knee, left 04/22/2020  . Hypokalemia 04/22/2020  . Osteoarthritis of multiple joints 12/15/2017  . Hyponatremia 03/28/2017  . Abnormality of gait and mobility 01/11/2017  . Cloudy vision 01/11/2017  . Foot drop, bilateral 01/11/2017  . Amputee, great toe, left ([place]) 01/11/2017  . 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 Macrocytic anemia 09/22/2015  . Moderate tricuspid insufficiency 04/21/2015  . History of prostate cancer 04/16/2015  . Idiopathic peripheral neuropathy 04/16/2015  . Essential hypertension 04/16/2015  . GERD (gastroesophageal reflux disease) 04/16/2015  . Arthritis 04/16/2015  . Pedal edema 04/16/2015  . Benign essential tremor 10/30/2014  . Hyperlipidemia, mixed 10/27/2014  . Chronic systolic CHF (congestive heart failure), NYHA class 2 ([place]) 04/23/2014  . Mitral insufficiency 11/05/2013  . CAD (coronary  artery disease) 11/01/2013  . Basal cell carcinoma of skin of other parts of face 03/14/2013  . Ankle sprain 09/09/2011  . History of fibula fracture 09/09/2011    Past Surgical History:  Procedure Laterality Date  . BASAL CELL CARCINOMA EXCISION    . COLONOSCOPY  05/01/2009   Positive for colonic polyps  . COLONOSCOPY WITH PROPOFOL N/A 02/24/2016   Procedure: COLONOSCOPY WITH PROPOFOL;  Surgeon: Christene Lye, MD;  Location: [facility] ENDOSCOPY;  Service: Endoscopy;  Laterality: N/A;  . CORONARY ARTERY BYPASS GRAFT  1987   X2  . HERNIA REPAIR  1990  . PROSTATE SURGERY  2004       Home Medications    Prior to Admission medications   Medication Sig Start Date End Date Taking? Authorizing Provider  Alpha Lipoic Acid 200 MG CAPS Take 2 capsules by mouth daily.   Yes [provider]  amLODipine (NORVASC) 2.5 MG tablet TAKE 1 TABLET BY MOUTH  DAILY Patient taking differently: Take 2.5 mg by mouth daily. 02/23/20  Yes Karamalegos, Devonne Doughty, DO  Apoaequorin (PREVAGEN PO) Take by mouth.   Yes [provider]  aspirin 81 MG tablet Take 81 mg by mouth daily.    Yes [provider]  azelastine (ASTELIN) 0.1 % nasal spray Place 1 spray into both nostrils 2 (two) times daily. Use in each nostril as directed 12/23/19  Yes Karamalegos, Devonne Doughty, DO  B Complex Vitamins (VITAMIN-B COMPLEX  PO) Take 1 tablet by mouth daily. Reported on 10/06/2015   Yes [provider]  Biotin 1000 MCG tablet Take 1,000 mcg by mouth daily.   Yes [provider]  Calcium Carb-Cholecalciferol 500-100 MG-UNIT CHEW Chew 500 mg by mouth daily.   Yes [provider]  cyanocobalamin 1000 MCG tablet Take 1,000 mcg by mouth daily.   Yes [provider]  folic acid (FOLVITE) 1 MG tablet Take 1 mg by mouth daily.   Yes [provider]  gabapentin (NEURONTIN) 600 MG tablet TAKE 2 TABLETS BY MOUTH  TWICE DAILY Patient taking differently: Take 1,200 mg  by mouth 2 (two) times daily. 02/23/20  Yes Karamalegos, Devonne Doughty, DO  Glucosamine Sulfate 1000 MG CAPS Take 1 capsule by mouth daily.   Yes [provider]  hydrochlorothiazide (HYDRODIURIL) 25 MG tablet Take 1 tablet (25 mg total) by mouth 2 (two) times daily. 09/20/19  Yes Karamalegos, Devonne Doughty, DO  Lecithin 400 MG CAPS Take 400 mg by mouth daily.   Yes [provider]  linezolid (ZYVOX) 600 MG tablet Take 600 mg by mouth 2 (two) times daily.   Yes [provider]  losartan (COZAAR) 50 MG tablet TAKE 1 TABLET BY MOUTH  DAILY Patient taking differently: Take 50 mg by mouth daily. 02/24/20  Yes Karamalegos, Devonne Doughty, DO  Melatonin 3 MG CAPS Take 3 mg by mouth at bedtime.   Yes [provider]  Omega 3 1000 MG CAPS Take 1,000 mg by mouth daily.   Yes [provider]  omeprazole (PRILOSEC) 20 MG capsule TAKE 1 CAPSULE BY MOUTH  TWICE DAILY BEFORE MEALS Patient taking differently: Take 20 mg by mouth 2 (two) times daily before a meal. 02/23/20  Yes Karamalegos, Devonne Doughty, DO  potassium chloride SA (KLOR-CON) 20 MEQ tablet Take 1 tablet (20 mEq total) by mouth 2 (two) times daily. 12/19/19  Yes Karamalegos, Devonne Doughty, DO  pravastatin (PRAVACHOL) 80 MG tablet TAKE 1 TABLET BY MOUTH  DAILY Patient taking differently: Take 80 mg by mouth daily. 02/23/20  Yes Karamalegos, Devonne Doughty, DO  primidone (MYSOLINE) 50 MG tablet TAKE 1 TABLET BY MOUTH 3  TIMES DAILY Patient taking differently: Take 50 mg by mouth 3 (three) times daily. 02/24/20  Yes Karamalegos, Devonne Doughty, DO  Selenium 200 MCG CAPS Take 200 mcg by mouth daily.   Yes [provider]  tretinoin (RETIN-A) 0.05 % cream Apply 1 application topically at bedtime.   Yes [provider]    Family History Family History  Problem Relation Age of Onset  . Heart disease Mother   . Congestive Heart Failure Father     Social History Social History   Tobacco Use  . Smoking status:  Never Smoker  . Smokeless tobacco: Never Used  Vaping Use  . Vaping Use: Never used  Substance Use Topics  . Alcohol use: Yes    Alcohol/week: 7.0 standard drinks    Types: 7 Glasses of wine per week    Comment: wine in the evening with dinner  . Drug use: No     Allergies   Codeine, Indocin [indomethacin], Latex, Mupirocin, Plavix [clopidogrel bisulfate], Polysporin [bacitracin-polymyxin b], Shellfish allergy, Tape, Neosporin [neomycin-bacitracin zn-polymyx], and Other   Review of Systems Review of Systems  Skin: Positive for wound. Negative for color change and rash.     Physical Exam Triage Vital Signs ED Triage Vitals  Enc Vitals Group     BP 06/13/20 1112 114/80  Pulse Rate 06/13/20 1112 76     Resp 06/13/20 1112 18     Temp 06/13/20 1112 98.5 F (36.9 C)     Temp Source 06/13/20 1112 Oral     SpO2 06/13/20 1112 97 %     Weight 06/13/20 1109 200 lb (90.7 kg)     Height 06/13/20 1109 6' (1.829 m)     Head Circumference --      Peak Flow --      Pain Score 06/13/20 1109 0     Pain Loc --      Pain Edu? --      Excl. in [place]? --    No data found.  Updated Vital Signs BP 114/80 (BP Location: Left Arm)   Pulse 76   Temp 98.5 F (36.9 C) (Oral)   Resp 18   Ht 6' (1.829 m)   Wt 200 lb (90.7 kg)   SpO2 97%   BMI 27.12 kg/m   Visual Acuity Right Eye Distance:   Left Eye Distance:   Bilateral Distance:    Right Eye Near:   Left Eye Near:    Bilateral Near:     Physical Exam Vitals and nursing note reviewed.  Constitutional:      Appearance: Normal appearance. He is normal weight.  HENT:     Head: Normocephalic and atraumatic.  Musculoskeletal:        General: Normal range of motion.  Skin:    General: Skin is warm and dry.     Capillary Refill: Capillary refill takes less than 2 seconds.  Neurological:     General: No focal deficit present.     Mental Status: He is alert and oriented to person, place, and time.  Psychiatric:        Mood and  Affect: Mood normal.        Behavior: Behavior normal.        Thought Content: Thought content normal.        Judgment: Judgment normal.      UC Treatments / Results  Labs (all labs ordered are listed, but only abnormal results are displayed) Labs Reviewed - No data to display  EKG   Radiology No results found.  Procedures Procedures (including critical care time)  Medications Ordered in UC Medications - No data to display  Initial Impression / Assessment and Plan / UC Course  I have reviewed the triage vital signs and the nursing notes.  Pertinent labs & imaging results that were available during my care of the patient were reviewed by me and considered in my medical decision making (see chart for details).    Patient has a hydrophilic foam dressing held placed by stockinette on his left heel.  All dressing was removed and there is no drainage present on the dressing.  The wound appeals well-healed.  There is no slough in the wound bed or granulation tissue.  There is no erythema or edema.  The surrounding skin is very scaly and flaky.  The dry skin was removed with the assistance of dry gauze rubbing the loose skin free.  Sween 24 cream was applied to the skin of the foot to provide moisturization.  The dressing was replaced with a small piece of calcium alginate activated by water in the remainder of the ulcer bed, covered with the hydrophilic foam plus dressing, and held in place with stockinette.  Patient was assisted back into his brace and diabetic shoe.  Patient left via ambulation with  the use of a walker.   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    ED Prescriptions    None     PDMP not reviewed this encounter.    Becky Augusta, NP 06/13/20 1213

## 2020-06-13 NOTE — ED Triage Notes (Signed)
Patient states that he needs a bandage changed on his left heel. States that he has an chronic ulcer x 3 months. States that he last had it changed on Thursday.

## 2020-06-19 ENCOUNTER — Encounter: Payer: Medicare Other | Attending: Physician Assistant | Admitting: Physician Assistant

## 2020-06-19 ENCOUNTER — Other Ambulatory Visit: Payer: Self-pay

## 2020-06-19 DIAGNOSIS — G603 Idiopathic progressive neuropathy: Secondary | ICD-10-CM | POA: Diagnosis not present

## 2020-06-19 DIAGNOSIS — L89623 Pressure ulcer of left heel, stage 3: Secondary | ICD-10-CM | POA: Insufficient documentation

## 2020-06-19 DIAGNOSIS — M21372 Foot drop, left foot: Secondary | ICD-10-CM | POA: Diagnosis not present

## 2020-06-19 DIAGNOSIS — L03116 Cellulitis of left lower limb: Secondary | ICD-10-CM | POA: Insufficient documentation

## 2020-06-19 DIAGNOSIS — M21371 Foot drop, right foot: Secondary | ICD-10-CM | POA: Insufficient documentation

## 2020-06-19 DIAGNOSIS — L89629 Pressure ulcer of left heel, unspecified stage: Secondary | ICD-10-CM | POA: Diagnosis present

## 2020-06-19 DIAGNOSIS — L97512 Non-pressure chronic ulcer of other part of right foot with fat layer exposed: Secondary | ICD-10-CM | POA: Diagnosis not present

## 2020-06-19 NOTE — Progress Notes (Addendum)
[name], [name] ([id number]) Visit Report for 06/19/2020 Chief Complaint Document Details Patient Name: [name], [name] Date of Service: 06/19/2020 9:15 AM Medical Record Number: [id number] Patient Account Number: [id number] Date of Birth/Sex: 1936-02-22 (85 y.o. M) Treating RN: Cornell Barman Primary Care Provider: [name] Other Clinician: Referring Provider: [name] Treating Provider/Extender: Skipper Cliche in Treatment: 18 Information Obtained from: Patient Chief Complaint Pressure ulcer left heel Electronic Signature(s) Signed: 06/19/2020 9:31:58 AM By: Worthy Keeler PA-C Entered By: Worthy Keeler on 06/19/2020 09:31:58 [name], [name] ([id number]) -------------------------------------------------------------------------------- HPI Details Patient Name: [name] Date of Service: 06/19/2020 9:15 AM Medical Record Number: [id number] Patient Account Number: [id number] Date of Birth/Sex: 19-Jul-1935 (84 y.o. M) Treating RN: Cornell Barman Primary Care Provider: [name] Other Clinician: Referring Provider: [name] Treating Provider/Extender: Skipper Cliche in Treatment: 18 History of Present Illness HPI Description: 02/11/2020 on evaluation today patient appears to be doing somewhat poorly on initial evaluation concerning his heel. He has been using a topical antibiotic ointment which does not seem to really have been doing the best job for him. He tells me that he had a wound that occurred as a result of his padding in the bottom of his shoe wear his foot drop brace goes actually had worn through and his foot was actually rubbing on the bottom of the shoe on the brace itself. Subsequently this ended up with a wound and he in turn had a lot of issues following. Fortunately there is no signs of active infection at this time which is good news in my opinion. With that being said the wound does appear to be somewhat moist with some evidence of  maceration unfortunately. He has no pain he does have neuropathy. He also has bilateral foot drop which she wears braces. This does appear to be however a pressure injury secondary to the AFO brace. The patient has been seen by podiatry since May 2021 unfortunately this is not healing hence the reason that he is coming to see [place] at this point to see if there is anything we can do to help in this regard. 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 9/15; the patient missed his appointment last week due to illness with his wife who had to be hospitalized. We are using Hydrofera Blue to the small punched out area in the middle of the left plantar heel. 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 This is felt to be pressure from the brace itself. 03/05/2020 upon evaluation today patient appears to be doing well in regard to his wound on the heel. He has been tolerating the dressing changes without complication which is great news. Fortunately there is no signs of active infection which I am very happy with. 03/19/2020 upon evaluation today patient appears to be doing a little worse in regard to his heel based on what I am seeing at this point. There actually appears to be a pocket off to the side laterally that is problematic for him at this point. I am not to clear this way with some sharp debridement currently. The patient was in agreement with that plan. 10/20; patient I do not usually see. He has a small wound on the left plantar heel. Some depth. He is apparently completing antibiotics. He has bilateral foot drop and therefore is AFO braces limiting what we can do to offload this. 04/14/2020 upon  evaluation today patient's heel actually appears to be doing fairly well. There is no signs of active infection at this time which is great news and overall I feel like he is managing nicely. The  only issue he has is that the individual who has been helping change the dressings at home unfortunately has not been available for the past week or so. She is out of town. Nonetheless he has been going to urgent care to have the dressings changed as he is not able to do this on his own and states that his wife is unable to do it for him. 11/10; patient in today for a nurse visit but I was asked to look at him. I note that we are treating him for a small punched-out area on the left plantar heel. He arrived with a new superficial area close to this wound I am not really clear of the exact etiology of this. The bigger problem was increased swelling warmth of the lower leg below the knee but intense erythema involving the entire anterior knee and medial thigh. His entire leg is warm and swollen. However it was not completely obvious to me that there was a big change in the lower leg however the nurses and the patient certainly thought there was. The patient stated that after his 2-week of trimethoprim sulfamethoxazole his legs looked very similar. His vital signs are stable he is afebrile but nevertheless if this is all infection this is extensive and failing outpatient management 04/28/2020 on evaluation today patient presents for follow-up after having been seen in the ER and admitted to the hospital due to cellulitis in the left knee. He has continued to have issues at this point with regard to his knee it still very hot to touch upon evaluation today. His heel ulcer is actually measuring smaller than the last time I saw him but he still is having a lot of difficulty with dry and flaky skin over his feet in general. 05/05/2020 on evaluation today patient appears to be doing pretty well in regard to his heel ulcer. This is measuring a little smaller which is good news. Fortunately there is no signs of active infection at this time. No fevers, chills, nausea, vomiting, or diarrhea. He does have a new  area on his right dorsal surface of his foot. With that being said this region we really do not know exactly what happened where this came from it appears to be somewhat abnormal in that there is a very well demarcated line that almost looks as if he could have had a burn or something of the sort that was partially protected 1 area but got burned over a different region. Either way I think we can probably get this healed fairly quickly. 05/14/2020 on evaluation today patient appears to be doing about the same in regard to his heel ulcer. We may need to try something different to see if this will be beneficial for him. I am thinking of switching him to collagen. 05/21/2020 on evaluation today patient appears to be doing excellent in regard to his foot ulcer from the standpoint of the overall appearance with that being said were just having some trouble getting it to granulate in. I do believe he would benefit from potentially utilizing a skin substitute though at this point in time at the end of the year I think it may be best to hold off till next year if we need to do this but I am going to  switch things out a little bit regular try endoform to see if this will be beneficial for him. 05/28/2020 upon evaluation today patient's wound actually appears to be doing fairly well is very healthy and I am pleased with the overall appearance in that regard. With that being said I do not see any signs of active infection at this time which is great news. No fevers, chills, nausea, vomiting, or diarrhea. Unfortunately the adhesive is sticking to his skin around the wound and causing some minor skin tearing. We need to avoid this if at all possible. 06/04/2020 upon evaluation today patient appears to be doing really about the same in regard to his wound. With that being said there was some callus around the edges of the wound that I did decide to go ahead and tried to clean away and the patient actually tolerated  me doing that today. I described what was found in the physical exam/assessment portion of this note 06/20/2019 upon evaluation today patient's heel ulcer actually showed signs of having some callus buildup around the edges of his wound though the wound itself appears to be doing a little bit better in my opinion. There is no signs of active infection at this time. Overall the size of the wound is not significantly improved but again the quality does appear to be better. DNIEL, HERZING ([id number]) Electronic Signature(s) Signed: 06/19/2020 4:52:10 PM By: Lenda Kelp PA-C Entered By: Lenda Kelp on 06/19/2020 16:52:10 [name] ([id number]) -------------------------------------------------------------------------------- Physical Exam Details Patient Name: [name] Date of Service: 06/19/2020 9:15 AM Medical Record Number: [id number] Patient Account Number: [id number] Date of Birth/Sex: Dec 24, 1935 (84 y.o. M) Treating RN: Huel Coventry Primary Care Provider: Saralyn Pilar Other Clinician: Referring Provider: Saralyn Pilar Treating Provider/Extender: Rowan Blase in Treatment: 18 Constitutional Well-nourished and well-hydrated in no acute distress. Respiratory normal breathing without difficulty. Psychiatric this patient is able to make decisions and demonstrates good insight into disease process. Alert and Oriented x 3. pleasant and cooperative. Notes Sharp debridement to relieve some of the callus around the edges of the wound as well as some of the rolled edges I cleared away. He did have some bleeding this was controlled with pressure. With that being said the patient tolerated the debridement today without complication post debridement wound bed appears to be doing much better. Electronic Signature(s) Signed: 06/19/2020 4:52:34 PM By: Lenda Kelp PA-C Entered By: Lenda Kelp on 06/19/2020 16:52:33 [name], [name]  ([id number]) -------------------------------------------------------------------------------- Physician Orders Details Patient Name: [name] Date of Service: 06/19/2020 9:15 AM Medical Record Number: [id number] Patient Account Number: [id number] Date of Birth/Sex: [birth year] (84 y.o. 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 Bathing/ Shower/ Hygiene o May shower; gently cleanse wound with antibacterial soap, rinse and pat dry prior to dressing wounds Edema Control - Lymphedema / Segmental Compressive Device / Other o Elevate, Exercise Daily and Avoid Standing for Long Periods of Time. Wound Treatment Wound #1 - Calcaneus Wound Laterality: Plantar, Left Primary Dressing: Prisma 4.34 (in) 3 x Per Week Discharge Instructions:  Moisten w/normal saline or sterile water; Cover wound as directed. Do not remove from wound bed. Secondary Dressing: Gauze 3 x Per Week Discharge Instructions: As directed: dry, moistened with saline or moistened with Dakins Solution Secured With: 74M Medipore H Soft Cloth Surgical Tape, 2x2 (in/yd) 3 x Per Week Secured With: Conforming Stretch Gauze Bandage 4x75 (in/in) 3 x Per Week Discharge Instructions: Apply as directed Secured With: Tubigrip Size F, 4x10 (in/yd) 3 x Per Week Discharge Instructions: Apply Tubigrip F 3 finger-widths below knee to base of toes to secure dressing and/or for swelling. Electronic  Signature(s) Signed: 06/19/2020 4:17:09 PM By: Worthy Keeler PA-C Signed: 06/19/2020 5:26:41 PM By: Gretta Cool BSN, RN, CWS, Kim RN, BSN Entered By: Gretta Cool, BSN, RN, CWS, Kim on 06/19/2020 10:25:39 [name] ([id number]) -------------------------------------------------------------------------------- Problem List Details Patient Name: [name] Date of Service: 06/19/2020 9:15 AM Medical Record Number: [id number] Patient Account Number: [id number] Date of Birth/Sex: 12-Jun-1936 (84 y.o. 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.3 Idiopathic progressive neuropathy 02/11/2020 No Yes M21.372 Foot drop, left foot 02/11/2020 No Yes M21.371 Foot drop, right foot 02/11/2020 No Yes L03.116 Cellulitis of left lower limb 04/22/2020 No Yes Inactive Problems Resolved Problems Electronic Signature(s) Signed: 06/19/2020 9:30:59 AM By: Worthy Keeler PA-C Entered By: Worthy Keeler on 06/19/2020 09:30:58 [name], [name] ([id number]) -------------------------------------------------------------------------------- Progress Note Details Patient Name: [name] Date of Service: 06/19/2020 9:15 AM Medical Record Number: [id number] Patient Account Number: [id number] Date of Birth/Sex: 1935-08-03 (84 y.o. M) Treating RN: Cornell Barman Primary Care Provider: [name] Other Clinician: Referring Provider: [name] Treating Provider/Extender: Skipper Cliche in Treatment: 18 Subjective Chief Complaint Information obtained from Patient Pressure ulcer left heel History of Present Illness  (HPI) 02/11/2020 on evaluation today patient appears to be doing somewhat poorly on initial evaluation concerning his heel. He has been using a topical antibiotic ointment which does not seem to really have been doing the best job for him. He tells me that he had a wound that occurred as a result of his padding in the bottom of his shoe wear his foot drop brace goes actually had worn through and his foot was actually rubbing on the bottom of the shoe on the brace itself. Subsequently this ended up with a wound and he in turn had a lot of issues following. Fortunately there is no signs of active infection at this time which is good news in my opinion. With that being said the wound does appear to be somewhat moist with some evidence of maceration unfortunately. He has no pain he does have neuropathy. He also has bilateral foot drop which she wears braces. This does appear to be however a pressure injury secondary to the AFO brace. The patient has been seen by podiatry since May 2021 unfortunately this is not healing hence the reason that he is coming to see [place] at this point to see if there is anything we can do to help in this regard. 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 9/15; the patient missed his appointment last week due to  illness with his wife who had to be hospitalized. We are using Hydrofera Blue to the small punched out area in the middle of the left plantar heel. 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 This is felt to be pressure from the brace itself. 03/05/2020 upon evaluation today patient appears to be doing well in regard to his wound on the heel. He has been tolerating the dressing changes without complication which is great news. Fortunately there is no signs of active infection which I am very happy with. 03/19/2020 upon evaluation today patient appears to be doing  a little worse in regard to his heel based on what I am seeing at this point. There actually appears to be a pocket off to the side laterally that is problematic for him at this point. I am not to clear this way with some sharp debridement currently. The patient was in agreement with that plan. 10/20; patient I do not usually see. He has a small wound on the left plantar heel. Some depth. He is apparently completing antibiotics. He has bilateral foot drop and therefore is AFO braces limiting what we can do to offload this. 04/14/2020 upon evaluation today patient's heel actually appears to be doing fairly well. There is no signs of active infection at this time which is great news and overall I feel like he is managing nicely. The only issue he has is that the individual who has been helping change the dressings at home unfortunately has not been available for the past week or so. She is out of town. Nonetheless he has been going to urgent care to have the dressings changed as he is not able to do this on his own and states that his wife is unable to do it for him. 11/10; patient in today for a nurse visit but I was asked to look at him. I note that we are treating him for a small punched-out area on the left plantar heel. He arrived with a new superficial area close to this wound I am not really clear of the exact etiology of this. The bigger problem was increased swelling warmth of the lower leg below the knee but intense erythema involving the entire anterior knee and medial thigh. His entire leg is warm and swollen. However it was not completely obvious to me that there was a big change in the lower leg however the nurses and the patient certainly thought there was. The patient stated that after his 2-week of trimethoprim sulfamethoxazole his legs looked very similar. His vital signs are stable he is afebrile but nevertheless if this is all infection this is extensive and failing outpatient  management 04/28/2020 on evaluation today patient presents for follow-up after having been seen in the ER and admitted to the hospital due to cellulitis in the left knee. He has continued to have issues at this point with regard to his knee it still very hot to touch upon evaluation today. His heel ulcer is actually measuring smaller than the last time I saw him but he still is having a lot of difficulty with dry and flaky skin over his feet in general. 05/05/2020 on evaluation today patient appears to be doing pretty well in regard to his heel ulcer. This is measuring a little smaller which is good news. Fortunately there is no signs of active infection at this time. No fevers, chills, nausea, vomiting, or diarrhea. He does have a new area on his right dorsal surface  of his foot. With that being said this region we really do not know exactly what happened where this came from it appears to be somewhat abnormal in that there is a very well demarcated line that almost looks as if he could have had a burn or something of the sort that was partially protected 1 area but got burned over a different region. Either way I think we can probably get this healed fairly quickly. 05/14/2020 on evaluation today patient appears to be doing about the same in regard to his heel ulcer. We may need to try something different to see if this will be beneficial for him. I am thinking of switching him to collagen. 05/21/2020 on evaluation today patient appears to be doing excellent in regard to his foot ulcer from the standpoint of the overall appearance with that being said were just having some trouble getting it to granulate in. I do believe he would benefit from potentially utilizing a skin substitute though at this point in time at the end of the year I think it may be best to hold off till next year if we need to do this but I am going to switch things out a little bit regular try endoform to see if this will be  beneficial for him. 05/28/2020 upon evaluation today patient's wound actually appears to be doing fairly well is very healthy and I am pleased with the overall appearance in that regard. With that being said I do not see any signs of active infection at this time which is great news. No fevers, chills, nausea, vomiting, or diarrhea. Unfortunately the adhesive is sticking to his skin around the wound and causing some minor skin tearing. We need to avoid this if at all possible. 06/04/2020 upon evaluation today patient appears to be doing really about the same in regard to his wound. With that being said there was some callus around the edges of the wound that I did decide to go ahead and tried to clean away and the patient actually tolerated me doing that today. I described what was found in the physical exam/assessment portion of this note [name], [name] ([id number]) 06/20/2019 upon evaluation today patient's heel ulcer actually showed signs of having some callus buildup around the edges of his wound though the wound itself appears to be doing a little bit better in my opinion. There is no signs of active infection at this time. Overall the size of the wound is not significantly improved but again the quality does appear to be better. Objective Constitutional Well-nourished and well-hydrated in no acute distress. Vitals Time Taken: 9:16 AM, Height: 72 in, Weight: 200 lbs, BMI: 27.1, Temperature: 97.7 F, Pulse: 75 bpm, Respiratory Rate: 18 breaths/min, Blood Pressure: 125/76 mmHg. Respiratory normal breathing without difficulty. Psychiatric this patient is able to make decisions and demonstrates good insight into disease process. Alert and Oriented x 3. pleasant and cooperative. General Notes: Sharp debridement to relieve some of the callus around the edges of the wound as well as some of the rolled edges I cleared away. He did have some bleeding this was controlled with pressure. With that being  said the patient tolerated the debridement today without complication post debridement wound bed appears to be doing much better. Integumentary (Hair, Skin) Wound #1 status is Open. Original cause of wound was Trauma. The wound is located on the Left,Plantar Calcaneus. The wound measures 0.4cm length x 0.5cm width x 0.4cm depth; 0.157cm^2 area and 0.063cm^3 volume. There is  Fat Layer (Subcutaneous Tissue) exposed. There is no tunneling noted, however, there is undermining starting at 12:00 and ending at 11:00 with a maximum distance of 0.5cm. There is a medium amount of sanguinous drainage noted. The wound margin is flat and intact. There is no granulation within the wound bed. There is no necrotic tissue within the wound bed. 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 Bathing/ Shower/ Hygiene: May shower; gently cleanse wound with antibacterial soap, rinse and pat dry prior to dressing wounds Edema Control - Lymphedema / Segmental Compressive Device / Other: Elevate, Exercise Daily and Avoid Standing for Long Periods of Time. WOUND #1: - Calcaneus Wound Laterality: Plantar, Left Primary Dressing: Prisma 4.34 (in) 3 x Per Week/ Discharge Instructions: Moisten w/normal saline or sterile water; Cover wound as directed. Do not remove from wound bed. Secondary Dressing: Gauze 3 x Per Week/ Discharge Instructions: As directed: dry, moistened with saline or moistened with Dakins Solution Secured With: 74M Medipore H Soft Cloth Surgical Tape, 2x2 (in/yd) 3 x Per Week/ Secured With: Conforming Stretch Gauze Bandage 4x75 (in/in) 3 x Per Week/ Carandang, [name] ([id number]) Discharge Instructions: Apply as directed Secured With:  Tubigrip Size F, 4x10 (in/yd) 3 x Per Week/ Discharge Instructions: Apply Tubigrip F 3 finger-widths below knee to base of toes to secure dressing and/or for swelling. 1. Would recommend currently that we have the patient go ahead and continue with the wound care measures as before specifically with regard to the collagen into the base of the wound which I think has done a good job for him up to this point. 2. I am also can recommend that he cover this with gauze. 3. He will also continue to use the Tubigrip F in order to help with edema control I think that has helped. We will see patient back for reevaluation in 1 week here in the clinic. If anything worsens or changes patient will contact our office for additional recommendations. Electronic Signature(s) Signed: 06/19/2020 4:58:55 PM By: Worthy Keeler PA-C Entered By: Worthy Keeler on 06/19/2020 16:58:55 [place], [name] ([id number]) -------------------------------------------------------------------------------- SuperBill Details Patient Name: [name] Date of Service: 06/19/2020 Medical Record Number: [id number] Patient Account Number: [id number] Date of Birth/Sex: 1936/04/26 (84 y.o. 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, left foot M21.371 Foot drop, right foot L03.116 Cellulitis of left lower limb Facility Procedures CPT4 Code: [id number] Description: 99213 - WOUND CARE VISIT-LEV 3 EST PT Modifier: Quantity: 1 Physician Procedures CPT4 Code: [id number] Description: 99213 - WC PHYS LEVEL 3 - EST PT Modifier: Quantity: 1 CPT4 Code: Description:  ICD-10 Diagnosis Description L89.623 Pressure ulcer of left heel, stage 3 L97.512 Non-pressure chronic ulcer of other part of right foot with fat layer e 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

## 2020-06-19 NOTE — Progress Notes (Signed)
[name], [name] ([id number]) Visit Report for 06/19/2020 Arrival Information Details Patient Name: [name], [name] Date of Service: 06/19/2020 9:15 AM Medical Record Number: [id number] Patient Account Number: [id number] Date of Birth/Sex: 07-15-1935 (85 y.o. M) Treating RN: Cornell Barman Primary Care Tyr Franca: [name] Other Clinician: Referring Suriya Kovarik: [name] Treating Finlay Mills/Extender: Skipper Cliche in Treatment: 18 Visit Information History Since Last Visit Added or deleted any medications: No Patient Arrived: [name] Any new allergies or adverse reactions: No Arrival Time: 09:15 Had a fall or experienced change in No Accompanied By: self activities of daily living that may affect Transfer Assistance: None risk of falls: Patient Identification Verified: Yes Signs or symptoms of abuse/neglect since last visito No Secondary Verification Process Completed: Yes Hospitalized since last visit: No Patient Requires Transmission-Based Precautions: No Implantable device outside of the clinic excluding No Patient Has Alerts: No cellular tissue based products placed in the center since last visit: Has Dressing in Place as Prescribed: Yes Has Compression in Place as Prescribed: Yes Pain Present Now: No Electronic Signature(s) Signed: 06/19/2020 5:05:25 PM By: Lorine Bears RCP, RRT, CHT Entered By: Lorine Bears on 06/19/2020 09:16:02 [name] ([id number]) -------------------------------------------------------------------------------- Clinic Level of Care Assessment Details Patient Name: [name] Date of Service: 06/19/2020 9:15 AM Medical Record Number: [id number] Patient Account Number: [id number] Date of Birth/Sex: [birth year] (84 y.o. M) Treating RN: Cornell Barman Primary Care Devereaux Grayson: [name] Other Clinician: Referring Anicka Stuckert: [name] Treating Rudell Marlowe/Extender: Skipper Cliche in Treatment:  18 Clinic Level of Care Assessment Items TOOL 4 Quantity Score _0  - Use when only an EandM is performed on FOLLOW-UP visit 0 ASSESSMENTS - Nursing Assessment / Reassessment X - Reassessment of Co-morbidities (includes updates in patient status) 1 10 X- 1 5 Reassessment of Adherence to Treatment Plan ASSESSMENTS - Wound and Skin Assessment / Reassessment X - Simple Wound Assessment / Reassessment - one wound 1 5 _1  - 0 Complex Wound Assessment / Reassessment - multiple wounds _2  - 0 Dermatologic / Skin Assessment (not related to wound area) ASSESSMENTS - Focused Assessment _3  - Circumferential Edema Measurements - multi extremities 0 _4  - 0 Nutritional Assessment / Counseling / Intervention _5  - 0 Lower Extremity Assessment (monofilament, tuning fork, pulses) _6  - 0 Peripheral Arterial Disease Assessment (using hand held doppler) ASSESSMENTS - Ostomy and/or Continence Assessment and Care _7  - Incontinence Assessment and Management 0 _8  - 0 Ostomy Care Assessment and Management (repouching, etc.) PROCESS - Coordination of Care X - Simple Patient / Family Education for ongoing care 1 15 _9  - 0 Complex (extensive) Patient / Family Education for ongoing care X- 1 10 Staff obtains Programmer, systems, Records, Test Results / Process Orders _10  - 0 Staff telephones HHA, Nursing Homes / Clarify orders / etc _11  - 0 Routine Transfer to another Facility (non-emergent condition) _12  - 0 Routine Hospital Admission (non-emergent condition) _13  - 0 New Admissions / Biomedical engineer / Ordering NPWT, Apligraf, etc. _14  - 0 Emergency Hospital Admission (emergent condition) X- 1 10 Simple Discharge Coordination _15  - 0 Complex (extensive) Discharge Coordination PROCESS - Special Needs _16  - Pediatric / Minor Patient Management 0 _17  - 0 Isolation Patient Management _18  - 0 Hearing / Language / Visual special needs _19  - 0 Assessment of Community assistance (transportation, D/C planning,  etc.) _20  - 0 Additional assistance / Altered mentation _21  - 0 Support Surface(s) Assessment (bed, cushion, seat, etc.) INTERVENTIONS - Wound Cleansing / Measurement Mazzoni, Kasean ([id number]) X- 1 5 Simple Wound Cleansing - one wound _22  -  0 Complex Wound Cleansing - multiple wounds X- 1 5 Wound Imaging (photographs - any number of wounds) _0  - 0 Wound Tracing (instead of photographs) _1  - 0 Simple Wound Measurement - one wound _2  - 0 Complex Wound Measurement - multiple wounds INTERVENTIONS - Wound Dressings _3  - Small Wound Dressing one or multiple wounds 0 _4  - 0 Medium Wound Dressing one or multiple wounds X- 1 20 Large Wound Dressing one or multiple wounds <FBPZWCHENIDPOEUM>_3<\/NTIRWERXVQMGQQPY>_1  - 0 Application of Medications - topical <PJKDTOIZTIWPYKDX>_8<\/PJASNKNLZJQBHALP>_3  - 0 Application of Medications - injection INTERVENTIONS - Miscellaneous _7  - External ear exam 0 _8  - 0 Specimen Collection (cultures, biopsies, blood, body fluids, etc.) _9  - 0 Specimen(s) / Culture(s) sent or taken to Lab for analysis _10  - 0 Patient Transfer (multiple staff / Civil Service fast streamer / Similar devices) _11  - 0 Simple Staple / Suture removal (25 or less) _12  - 0 Complex Staple / Suture removal (26 or more) _13  - 0 Hypo / Hyperglycemic Management (close monitor of Blood Glucose) _14  - 0 Ankle / Brachial Index (ABI) - do not check if billed separately X- 1 5 Vital Signs Has the patient been seen at the hospital within the last three years: Yes Total Score: 90 Level Of Care: New/Established - Level 3 Electronic Signature(s) Signed: 06/19/2020 5:26:41 PM By: Gretta Cool, BSN, RN, CWS, Kim RN, BSN Entered By: Gretta Cool, BSN, RN, CWS, Kim on 06/19/2020 10:26:13 [name] ([id number]) -------------------------------------------------------------------------------- Encounter Discharge Information Details Patient Name: [name] Date of Service: 06/19/2020 9:15 AM Medical Record Number: [id number] Patient Account Number: [id number] Date of Birth/Sex: 08-06-1935 (84 y.o.  M) Treating RN: Cornell Barman Primary Care Lovelle Lema: [name] Other Clinician: Referring Janece Laidlaw: [name] Treating Hale Chalfin/Extender: Skipper Cliche in Treatment: 18 Encounter Discharge Information Items Discharge Condition: Stable Ambulatory Status: Walker Discharge Destination: Home Transportation: Private Auto Accompanied By: self Schedule Follow-up Appointment: Yes Clinical Summary of Care: Electronic Signature(s) Signed: 06/19/2020 5:26:41 PM By: Gretta Cool, BSN, RN, CWS, Kim RN, BSN Entered By: Gretta Cool, BSN, RN, CWS, Kim on 06/19/2020 10:31:28 [name] ([id number]) -------------------------------------------------------------------------------- Lower Extremity Assessment Details Patient Name: [name] Date of Service: 06/19/2020 9:15 AM Medical Record Number: [id number] Patient Account Number: [id number] Date of Birth/Sex: 20-Nov-1935 (84 y.o. M) Treating RN: Cornell Barman Primary Care Lynda Wanninger: [name] Other Clinician: Referring Tegan Burnside: [name] Treating Apollos Tenbrink/Extender: Skipper Cliche in Treatment: 18 Edema Assessment Assessed: [Left: No] Patrice Paradise: No] [Left: Edema] [Right: :] Calf Left: Right: Point of Measurement: 32 cm From Medial Instep 42 cm Ankle Left: Right: Point of Measurement: 12 cm From Medial Instep 28 cm Vascular Assessment Pulses: Dorsalis Pedis Palpable: [Left:Yes] Electronic Signature(s) Signed: 06/19/2020 5:26:41 PM By: Gretta Cool, BSN, RN, CWS, Kim RN, BSN Entered By: Gretta Cool, BSN, RN, CWS, Kim on 06/19/2020 09:58:09 [name] ([id number]) -------------------------------------------------------------------------------- Multi Wound Chart Details Patient Name: [name] Date of Service: 06/19/2020 9:15 AM Medical Record Number: [id number] Patient Account Number: [id number] Date of Birth/Sex: 1936/04/08 (84 y.o. M) Treating RN: Cornell Barman Primary Care Jonta Gastineau: [name] Other  Clinician: Referring Corretta Munce: [name] Treating Jailynn Lavalais/Extender: Skipper Cliche in Treatment: 18 Vital Signs Height(in): 72 Pulse(bpm): 75 Weight(lbs): 200 Blood Pressure(mmHg): 125/76 Body Mass Index(BMI): 27 Temperature(F): 97.7 Respiratory Rate(breaths/min): 18 Photos: [N/A:N/A] Wound Location: Left, Plantar Calcaneus N/A N/A Wounding Event: Trauma N/A N/A Primary Etiology: Trauma, Other N/A N/A Secondary Etiology: Pressure Ulcer N/A N/A Comorbid History: Hypertension, Myocardial Infarction, N/A N/A Osteoarthritis, Neuropathy Date Acquired: 10/21/2019 N/A N/A Weeks of Treatment: 18 N/A N/A Wound Status: Open  N/A N/A Measurements L x W x D (cm) 0.4x0.5x0.4 N/A N/A Area (cm) : 0.157 N/A N/A Volume (cm) : 0.063 N/A N/A % Reduction in Area: 19.90% N/A N/A % Reduction in Volume: 20.30% N/A N/A Starting Position 1 (o'clock): 12 Ending Position 1 (o'clock): 11 Maximum Distance 1 (cm): 0.5 Undermining: Yes N/A N/A Classification: Full Thickness Without Exposed N/A N/A Support Structures Exudate Amount: Medium N/A N/A Exudate Type: Sanguinous N/A N/A Exudate Color: red N/A N/A Wound Margin: Flat and Intact N/A N/A Granulation Amount: None Present (0%) N/A N/A Necrotic Amount: None Present (0%) N/A N/A Exposed Structures: Fat Layer (Subcutaneous Tissue): N/A N/A Yes Fascia: No Tendon: No Muscle: No Joint: No Bone: No Epithelialization: None N/A N/A Treatment Notes Electronic Signature(s) Signed: 06/19/2020 5:26:41 PM By: Gretta Cool, BSN, RN, CWS, Kim RN, BSN [place], [place] ([id number]) Entered By: Gretta Cool, BSN, RN, CWS, Kim on 06/19/2020 10:22:07 [name] ([id number]) -------------------------------------------------------------------------------- Multi-Disciplinary Care Plan Details Patient Name: [name] Date of Service: 06/19/2020 9:15 AM Medical Record Number: [id number] Patient Account Number: [id number] Date of Birth/Sex: 1936-04-09 (84 y.o.  M) Treating RN: Cornell Barman Primary Care Kolin Erdahl: [name] Other Clinician: Referring Masyn Rostro: [name] Treating Bhavana Kady/Extender: Skipper Cliche in Treatment: 18 Active Inactive Necrotic Tissue Nursing Diagnoses: Impaired tissue integrity related to necrotic/devitalized tissue Knowledge deficit related to management of necrotic/devitalized tissue Goals: Necrotic/devitalized tissue will be minimized in the wound bed Date Initiated: 04/14/2020 Target Resolution Date: 05/14/2020 Goal Status: Active Patient/caregiver will verbalize understanding of reason and process for debridement of necrotic tissue Date Initiated: 04/14/2020 Target Resolution Date: 05/14/2020 Goal Status: Active Interventions: Assess patient pain level pre-, during and post procedure and prior to discharge Provide education on necrotic tissue and debridement process Treatment Activities: Apply topical anesthetic as ordered : 04/14/2020 Excisional debridement : 04/14/2020 Notes: Pressure Nursing Diagnoses: Potential for impaired tissue integrity related to pressure, friction, moisture, and shear Goals: Patient/caregiver will verbalize understanding of pressure ulcer management Date Initiated: 06/19/2020 Target Resolution Date: 07/10/2020 Goal Status: Active Interventions: Assess offloading mechanisms upon admission and as needed Notes: Wound/Skin Impairment Nursing Diagnoses: Impaired tissue integrity Knowledge deficit related to ulceration/compromised skin integrity Goals: Patient/caregiver will verbalize understanding of skin care regimen Date Initiated: 02/11/2020 Date Inactivated: 05/05/2020 Target Resolution Date: 02/24/2020 Goal Status: Met Ulcer/skin breakdown will have a volume reduction of 80% by week 12 Date Initiated: 05/05/2020 Target Resolution Date: 05/05/2020 Goal Status: Active Ulcer/skin breakdown will heal within 14 weeks Date Initiated: 05/05/2020 Target  Resolution Date: 05/19/2020 [place], [place] ([id number]) Goal Status: Active Interventions: Assess patient/caregiver ability to obtain necessary supplies Assess patient/caregiver ability to perform ulcer/skin care regimen upon admission and as needed Assess ulceration(s) every visit Provide education on ulcer and skin care Treatment Activities: Skin care regimen initiated : 02/11/2020 Notes: Electronic Signature(s) Signed: 06/19/2020 5:26:41 PM By: Gretta Cool, BSN, RN, CWS, Kim RN, BSN Entered By: Gretta Cool, BSN, RN, CWS, Kim on 06/19/2020 10:21:56 [name] ([id number]) -------------------------------------------------------------------------------- Pain Assessment Details Patient Name: [name] Date of Service: 06/19/2020 9:15 AM Medical Record Number: [id number] Patient Account Number: [id number] Date of Birth/Sex: Mar 22, 1936 (84 y.o. M) Treating RN: Cornell Barman Primary Care Tillmon Kisling: [name] Other Clinician: Referring Shian Goodnow: [name] Treating Timmy Bubeck/Extender: Skipper Cliche in Treatment: 18 Active Problems Location of Pain Severity and Description of Pain Patient Has Paino No Site Locations Pain Management and Medication Current Pain Management: Electronic Signature(s) Signed: 06/19/2020 5:26:41 PM By: Gretta Cool, BSN, RN, CWS, Kim RN, BSN Entered By: Gretta Cool, BSN, RN, CWS, Kim on 06/19/2020  09:53:16 ANSH, FAUBLE ([id number]) -------------------------------------------------------------------------------- Patient/Caregiver Education Details Patient Name: [name] Date of Service: 06/19/2020 9:15 AM Medical Record Number: [id number] Patient Account Number: [id number] Date of Birth/Gender: [birth year] (85 y.o. M) Treating RN: Cornell Barman Primary Care Physician: [name] Other Clinician: Referring Physician: [name] Treating Physician/Extender: Skipper Cliche in Treatment: 18 Education Assessment Education Provided  To: Patient Education Topics Provided Wound Debridement: Handouts: Wound Debridement Methods: Demonstration, Explain/Verbal Responses: State content correctly Wound/Skin Impairment: Handouts: Caring for Your Ulcer Responses: State content correctly Electronic Signature(s) Signed: 06/19/2020 5:26:41 PM By: Gretta Cool, BSN, RN, CWS, Kim RN, BSN Entered By: Gretta Cool, BSN, RN, CWS, Kim on 06/19/2020 10:30:47 [name] ([id number]) -------------------------------------------------------------------------------- Wound Assessment Details Patient Name: [name] Date of Service: 06/19/2020 9:15 AM Medical Record Number: [id number] Patient Account Number: [id number] Date of Birth/Sex: 04-23-1936 (84 y.o. M) Treating RN: Cornell Barman Primary Care Shantoya Geurts: [name] Other Clinician: Referring Donivan Thammavong: [name] Treating Melquiades Kovar/Extender: Skipper Cliche in Treatment: 18 Wound Status Wound Number: 1 Primary Etiology: Trauma, Other Wound Location: Left, Plantar Calcaneus Secondary Pressure Ulcer Etiology: Wounding Event: Trauma Wound Status: Open Date Acquired: 10/21/2019 Comorbid Hypertension, Myocardial Infarction, Osteoarthritis, Weeks Of Treatment: 18 History: Neuropathy Clustered Wound: No Photos Wound Measurements Length: (cm) 0.4 Width: (cm) 0.5 Depth: (cm) 0.4 Area: (cm) 0.157 Volume: (cm) 0.063 % Reduction in Area: 19.9% % Reduction in Volume: 20.3% Epithelialization: None Tunneling: No Undermining: Yes Starting Position (o'clock): 12 Ending Position (o'clock): 11 Maximum Distance: (cm) 0.5 Wound Description Classification: Full Thickness Without Exposed Support Structu Wound Margin: Flat and Intact Exudate Amount: Medium Exudate Type: Sanguinous Exudate Color: red res Foul Odor After Cleansing: No Slough/Fibrino Yes Wound Bed Granulation Amount: None Present (0%) Exposed Structure Necrotic Amount: None Present (0%) Fascia Exposed: No Fat  Layer (Subcutaneous Tissue) Exposed: Yes Tendon Exposed: No Muscle Exposed: No Joint Exposed: No Bone Exposed: No Treatment Notes Wound #1 (Calcaneus) Wound Laterality: Plantar, Left Cleanser Dehne, Daden ([id number]) Peri-Wound Care Topical Primary Dressing Prisma 4.34 (in) Discharge Instruction: Moisten w/normal saline or sterile water; Cover wound as directed. Do not remove from wound bed. Secondary Dressing Gauze Discharge Instruction: As directed: dry, moistened with saline or moistened with Dakins Solution Secured With 74M Medipore H Soft Cloth Surgical Tape, 2x2 (in/yd) Conforming Stretch Gauze Bandage 4x75 (in/in) Discharge Instruction: Apply as directed Tubigrip Size F, 4x10 (in/yd) Discharge Instruction: Apply Tubigrip F 3 finger-widths below knee to base of toes to secure dressing and/or for swelling. Compression Wrap Compression Stockings Add-Ons Electronic Signature(s) Signed: 06/19/2020 5:26:41 PM By: Gretta Cool, BSN, RN, CWS, Kim RN, BSN Entered By: Gretta Cool, BSN, RN, CWS, Kim on 06/19/2020 09:57:41 [name] ([id number]) -------------------------------------------------------------------------------- Vitals Details Patient Name: [name] Date of Service: 06/19/2020 9:15 AM Medical Record Number: [id number] Patient Account Number: [id number] Date of Birth/Sex: 12-02-1935 (84 y.o. M) Treating RN: Cornell Barman Primary Care Zackarey Holleman: [name] Other Clinician: Referring Terryn Rosenkranz: [name] Treating Larra Crunkleton/Extender: Skipper Cliche in Treatment: 18 Vital Signs Time Taken: 09:16 Temperature (F): 97.7 Height (in): 72 Pulse (bpm): 75 Weight (lbs): 200 Respiratory Rate (breaths/min): 18 Body Mass Index (BMI): 27.1 Blood Pressure (mmHg): 125/76 Reference Range: 80 - 120 mg / dl Electronic Signature(s) Signed: 06/19/2020 5:05:25 PM By: Lorine Bears RCP, RRT, CHT Entered By: Becky Sax, Amado Nash on 06/19/2020 09:18:08

## 2020-06-24 ENCOUNTER — Other Ambulatory Visit: Payer: Self-pay

## 2020-06-24 ENCOUNTER — Encounter: Payer: Self-pay | Admitting: Family Medicine

## 2020-06-24 ENCOUNTER — Ambulatory Visit (INDEPENDENT_AMBULATORY_CARE_PROVIDER_SITE_OTHER): Payer: Medicare Other | Admitting: Family Medicine

## 2020-06-24 VITALS — BP 136/75 | HR 89 | Ht 72.0 in | Wt 202.4 lb

## 2020-06-24 DIAGNOSIS — L853 Xerosis cutis: Secondary | ICD-10-CM | POA: Diagnosis not present

## 2020-06-24 DIAGNOSIS — R600 Localized edema: Secondary | ICD-10-CM | POA: Diagnosis not present

## 2020-06-24 MED ORDER — FUROSEMIDE 40 MG PO TABS: 40.0000 mg | ORAL_TABLET | Freq: Every day | ORAL | 2 refills | Status: DC

## 2020-06-24 NOTE — Patient Instructions (Addendum)
Thank you for coming to the office today.  INCREASE Furosemide from 20 to 40mg  daily, new rx sent  Caution with BP, may need to reduce HCTZ to 1 pill a day if BP is too low or dizzy  Try to balance hydration  Please schedule w/ [facility] for consultation on the dry skin.  Keep on the vaseline and OTC regimen for helping the skin right now.   Please schedule a Follow-up Appointment to: Return if symptoms worsen or fail to improve.  If you have any other questions or concerns, please feel free to call the office or send a message through [facility]. You may also schedule an earlier appointment if necessary.  Additionally, you may be receiving a survey about your experience at our office within a few days to 1 week by e-mail or mail. We value your feedback.  [name], DO [place]

## 2020-06-24 NOTE — Progress Notes (Signed)
Subjective:    Patient ID: [name], male    DOB: 03/29/1936, 85 y.o.   MRN: [id number]  [name] is a 85 y.o. 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 Has good urine output, but swelling unresolved. Wears stockings but not always compression.  Admits a lot of dry flaky skin and breaks in the skin without ulceration, some redness and swelling is worse at times.  He will return to [facility] - they have not scheduled yet.   Depression screen [facility] 2/9 12/19/2019 03/20/2019 02/05/2019  Decreased Interest 0 0 0  Down, Depressed, Hopeless 0 0 0  PHQ - 2 Score 0 0 0    Social History   Tobacco Use  . Smoking status: Never Smoker  . Smokeless tobacco: Never Used  Vaping Use  . Vaping Use: Never used  Substance Use Topics  . Alcohol use: Yes    Alcohol/week: 7.0 standard drinks    Types: 7 Glasses of wine per week    Comment: wine in the evening with dinner  . Drug use: No    Review of Systems Per HPI unless specifically indicated above     Objective:    BP 136/75   Pulse 89   Ht 6' (1.829 m)   Wt 202 lb 6.4 oz (91.8 kg)   SpO2 100%   BMI 27.45 kg/m   Wt Readings from Last 3 Encounters:  06/24/20 202 lb 6.4 oz (91.8 kg)  06/13/20 200 lb (90.7 kg)  04/29/20 210 lb (95.3 kg)    Physical Exam Vitals and nursing note reviewed.  Constitutional:      General: He is not in acute distress.    Appearance: He is well-developed and well-nourished. He is not diaphoretic.     Comments: Well-appearing, comfortable, cooperative  HENT:     Head: Normocephalic and atraumatic.     Mouth/Throat:     Mouth: Oropharynx is clear and moist.  Eyes:     General:        Right eye: No discharge.        Left  eye: No discharge.     Conjunctiva/sclera: Conjunctivae normal.  Cardiovascular:     Rate and Rhythm: Normal rate.  Pulmonary:     Effort: Pulmonary effort is normal.  Musculoskeletal:     Left lower leg: Edema present.  Skin:    General: Skin is warm and dry.     Findings: Rash present. No erythema.     Comments: Dry skin flaking dermatitis left lower leg mid leg / ankle down to foot. Multiple slight breaks in skin. No ulceration except LEFT HEEL - chronic problem  S/p great toe amputation chronic, healed.  Neurological:     Mental Status: He is alert and oriented to person, place, and time.  Psychiatric:        Mood and Affect: Mood and affect normal.        Behavior: Behavior normal.     Comments: Well groomed, good eye contact, normal speech and thoughts        Results for orders placed or performed during the hospital encounter of 04/22/20  Blood culture (routine x 2)   Specimen: BLOOD  Result  Value Ref Range   Specimen Description BLOOD BLOOD LEFT HAND    Special Requests      BOTTLES DRAWN AEROBIC AND ANAEROBIC Blood Culture adequate volume   Culture      NO GROWTH 5 DAYS Performed at [facility], [address] [place], [place] [address]    Report Status 04/27/2020 FINAL   Blood culture (routine x 2)   Specimen: BLOOD  Result Value Ref Range   Specimen Description BLOOD LEFT ANTECUBITAL    Special Requests      BOTTLES DRAWN AEROBIC AND ANAEROBIC Blood Culture adequate volume   Culture      NO GROWTH 5 DAYS Performed at [facility], [address] [place], [place] [address]    Report Status 04/27/2020 FINAL   Respiratory Panel by RT PCR (Flu A&B, Covid) - Nasopharyngeal Swab   Specimen: Nasopharyngeal Swab  Result Value Ref Range   SARS Coronavirus 2 by RT PCR NEGATIVE NEGATIVE   Influenza A by PCR NEGATIVE NEGATIVE   Influenza B by PCR NEGATIVE NEGATIVE  MRSA PCR Screening   Specimen: Nasopharyngeal  Result Value Ref Range    MRSA by PCR POSITIVE (A) NEGATIVE  Lactic acid, plasma  Result Value Ref Range   Lactic Acid, Venous 1.0 0.5 - 1.9 mmol/L  Lactic acid, plasma  Result Value Ref Range   Lactic Acid, Venous 1.0 0.5 - 1.9 mmol/L  Comprehensive metabolic panel  Result Value Ref Range   Sodium 129 (L) 135 - 145 mmol/L   Potassium 3.0 (L) 3.5 - 5.1 mmol/L   Chloride 90 (L) 98 - 111 mmol/L   CO2 27 22 - 32 mmol/L   Glucose, Bld 101 (H) 70 - 99 mg/dL   BUN 14 8 - 23 mg/dL   Creatinine, Ser 0.65 0.61 - 1.24 mg/dL   Calcium 9.4 8.9 - 10.3 mg/dL   Total Protein 7.5 6.5 - 8.1 g/dL   Albumin 4.0 3.5 - 5.0 g/dL   AST 18 15 - 41 U/L   ALT 15 0 - 44 U/L   Alkaline Phosphatase 68 38 - 126 U/L   Total Bilirubin 1.0 0.3 - 1.2 mg/dL   GFR, Estimated >60 >60 mL/min   Anion gap 12 5 - 15  CBC with Differential  Result Value Ref Range   WBC 9.7 4.0 - 10.5 K/uL   RBC 2.95 (L) 4.22 - 5.81 MIL/uL   Hemoglobin 11.0 (L) 13.0 - 17.0 g/dL   HCT 30.9 (L) 39.0 - 52.0 %   MCV 104.7 (H) 80.0 - 100.0 fL   MCH 37.3 (H) 26.0 - 34.0 pg   MCHC 35.6 30.0 - 36.0 g/dL   RDW 13.9 11.5 - 15.5 %   Platelets 205 150 - 400 K/uL   nRBC 0.0 0.0 - 0.2 %   Neutrophils Relative % 84 %   Neutro Abs 8.1 (H) 1.7 - 7.7 K/uL   Lymphocytes Relative 8 %   Lymphs Abs 0.8 0.7 - 4.0 K/uL   Monocytes Relative 7 %   Monocytes Absolute 0.7 0.1 - 1.0 K/uL   Eosinophils Relative 1 %   Eosinophils Absolute 0.1 0.0 - 0.5 K/uL   Basophils Relative 0 %   Basophils Absolute 0.0 0.0 - 0.1 K/uL   Immature Granulocytes 0 %   Abs Immature Granulocytes 0.04 0.00 - 0.07 K/uL  Urinalysis, Complete w Microscopic  Result Value Ref Range   Color, Urine YELLOW (A) YELLOW   APPearance CLEAR (A) CLEAR  Specific Gravity, Urine 1.011 1.005 - 1.030   pH 6.0 5.0 - 8.0   Glucose, UA NEGATIVE NEGATIVE mg/dL   Hgb urine dipstick SMALL (A) NEGATIVE   Bilirubin Urine NEGATIVE NEGATIVE   Ketones, ur NEGATIVE NEGATIVE mg/dL   Protein, ur NEGATIVE NEGATIVE mg/dL    Nitrite NEGATIVE NEGATIVE   Leukocytes,Ua NEGATIVE NEGATIVE   RBC / HPF 0-5 0 - 5 RBC/hpf   WBC, UA 0-5 0 - 5 WBC/hpf   Bacteria, UA NONE SEEN NONE SEEN   Squamous Epithelial / LPF 0-5 0 - 5   Hyaline Casts, UA PRESENT   Osmolality  Result Value Ref Range   Osmolality 270 (L) 275 - 295 mOsm/kg  Osmolality, urine  Result Value Ref Range   Osmolality, Ur 423 300 - 900 mOsm/kg  Protein Electro, Random Urine  Result Value Ref Range   Total Protein, Urine 8.8 Not Estab. mg/dL   Albumin ELP, Urine 100.0 %   Alpha-1-Globulin, U 0.0 %   Alpha-2-Globulin, U 0.0 %   Beta Globulin, U 0.0 %   Gamma Globulin, U 0.0 %   M Component, Ur Not Observed Not Observed %   Please Note: Comment   Vitamin B12  Result Value Ref Range   Vitamin B-12 6,201 (H) 180 - 914 pg/mL  Folate, serum, performed at [facility] lab  Result Value Ref Range   Folate 45.0 >5.9 ng/mL  Iron and TIBC  Result Value Ref Range   Iron 40 (L) 45 - 182 ug/dL   TIBC 291 250 - 450 ug/dL   Saturation Ratios 14 (L) 17.9 - 39.5 %   UIBC 251 ug/dL  Brain natriuretic peptide  Result Value Ref Range   B Natriuretic Peptide 233.9 (H) 0.0 - 100.0 pg/mL  Magnesium  Result Value Ref Range   Magnesium <0.1 (LL) 1.7 - 2.4 mg/dL  Basic metabolic panel  Result Value Ref Range   Sodium 131 (L) 135 - 145 mmol/L   Potassium 3.3 (L) 3.5 - 5.1 mmol/L   Chloride 97 (L) 98 - 111 mmol/L   CO2 26 22 - 32 mmol/L   Glucose, Bld 94 70 - 99 mg/dL   BUN 14 8 - 23 mg/dL   Creatinine, Ser 0.76 0.61 - 1.24 mg/dL   Calcium 8.5 (L) 8.9 - 10.3 mg/dL   GFR, Estimated >60 >60 mL/min   Anion gap 8 5 - 15  CBC  Result Value Ref Range   WBC 6.3 4.0 - 10.5 K/uL   RBC 2.37 (L) 4.22 - 5.81 MIL/uL   Hemoglobin 8.9 (L) 13.0 - 17.0 g/dL   HCT 25.5 (L) 39.0 - 52.0 %   MCV 107.6 (H) 80.0 - 100.0 fL   MCH 37.6 (H) 26.0 - 34.0 pg   MCHC 34.9 30.0 - 36.0 g/dL   RDW 14.5 11.5 - 15.5 %   Platelets 169 150 - 400 K/uL   nRBC 0.0 0.0 - 0.2 %  Magnesium   Result Value Ref Range   Magnesium 2.3 1.7 - 2.4 mg/dL  Ferritin  Result Value Ref Range   Ferritin 118 24 - 336 ng/mL  Sodium, urine, random  Result Value Ref Range   Sodium, Ur 79 mmol/L  Phosphorus  Result Value Ref Range   Phosphorus 3.1 2.5 - 4.6 mg/dL  CBC with Differential/Platelet  Result Value Ref Range   WBC 7.0 4.0 - 10.5 K/uL   RBC 2.55 (L) 4.22 - 5.81 MIL/uL   Hemoglobin 9.7 (L) 13.0 - 17.0  g/dL   HCT 27.8 (L) 39.0 - 52.0 %   MCV 109.0 (H) 80.0 - 100.0 fL   MCH 38.0 (H) 26.0 - 34.0 pg   MCHC 34.9 30.0 - 36.0 g/dL   RDW 14.0 11.5 - 15.5 %   Platelets 209 150 - 400 K/uL   nRBC 0.0 0.0 - 0.2 %   Neutrophils Relative % 73 %   Neutro Abs 5.1 1.7 - 7.7 K/uL   Lymphocytes Relative 15 %   Lymphs Abs 1.1 0.7 - 4.0 K/uL   Monocytes Relative 8 %   Monocytes Absolute 0.6 0.1 - 1.0 K/uL   Eosinophils Relative 4 %   Eosinophils Absolute 0.3 0.0 - 0.5 K/uL   Basophils Relative 0 %   Basophils Absolute 0.0 0.0 - 0.1 K/uL   Immature Granulocytes 0 %   Abs Immature Granulocytes 0.02 0.00 - 0.07 K/uL  Basic metabolic panel  Result Value Ref Range   Sodium 128 (L) 135 - 145 mmol/L   Potassium 3.6 3.5 - 5.1 mmol/L   Chloride 95 (L) 98 - 111 mmol/L   CO2 28 22 - 32 mmol/L   Glucose, Bld 102 (H) 70 - 99 mg/dL   BUN 11 8 - 23 mg/dL   Creatinine, Ser 0.66 0.61 - 1.24 mg/dL   Calcium 8.6 (L) 8.9 - 10.3 mg/dL   GFR, Estimated >60 >60 mL/min   Anion gap 5 5 - 15  Magnesium  Result Value Ref Range   Magnesium 1.8 1.7 - 2.4 mg/dL  Basic metabolic panel  Result Value Ref Range   Sodium 133 (L) 135 - 145 mmol/L   Potassium 3.9 3.5 - 5.1 mmol/L   Chloride 100 98 - 111 mmol/L   CO2 23 22 - 32 mmol/L   Glucose, Bld 89 70 - 99 mg/dL   BUN 14 8 - 23 mg/dL   Creatinine, Ser 0.65 0.61 - 1.24 mg/dL   Calcium 8.5 (L) 8.9 - 10.3 mg/dL   GFR, Estimated >60 >60 mL/min   Anion gap 10 5 - 15  Magnesium  Result Value Ref Range   Magnesium 1.8 1.7 - 2.4 mg/dL  ECHOCARDIOGRAM COMPLETE   Result Value Ref Range   Weight 3,280 oz   Height 72 in   BP 134/75 mmHg   Ao pk vel 0.99 m/s   AV Mean grad 2.0 mmHg   AV Peak grad 3.9 mmHg   S' Lateral 3.85 cm   Area-P 1/2 3.50 cm2      Assessment & Plan:   Problem List Items Addressed This Visit    Bilateral lower extremity edema - Primary   Relevant Medications   furosemide (LASIX) 40 MG tablet    Other Visit Diagnoses    Dry skin dermatitis          Chronic LE Edema L>R 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  Increase dose Furosemide from 20mg  up to 40mg  daily - new rx sent. Caution hypotension, side effect, he can monitor BP closely May need to reduce HCTZ from 25mg  BID down to daily  RICE therapy, compression as tolerated  He should schedule with [facility] Dermatologist for more advice on topical therapy for dry skin dermatitis. He has allergies to some topicals, limited options.  Next may consider refer to Vascular consultation for  L LE edema if indicated   Meds ordered this encounter  Medications  . furosemide (LASIX) 40 MG tablet    Sig: Take 1 tablet (40 mg total) by mouth daily.    Dispense:  30 tablet    Refill:  2      Follow up plan: Return if symptoms worsen or fail to improve.   [name], [facility] 06/24/2020, 10:53 AM

## 2020-06-25 ENCOUNTER — Ambulatory Visit: Payer: Medicare Other | Admitting: Physician Assistant

## 2020-06-30 ENCOUNTER — Telehealth: Payer: Self-pay

## 2020-06-30 NOTE — Telephone Encounter (Signed)
I attempted to contact the patient but there was no answer and no option to leave a message.

## 2020-07-02 ENCOUNTER — Other Ambulatory Visit: Payer: Self-pay

## 2020-07-02 ENCOUNTER — Encounter: Payer: Medicare Other | Admitting: Physician Assistant

## 2020-07-02 DIAGNOSIS — L89623 Pressure ulcer of left heel, stage 3: Secondary | ICD-10-CM | POA: Diagnosis not present

## 2020-07-02 NOTE — Telephone Encounter (Signed)
Pt returning call / please advise  

## 2020-07-03 NOTE — Progress Notes (Signed)
AZARI, HASLER ([id number]) Visit Report for 07/02/2020 Chief Complaint Document Details Patient Name: [name], [name] Date of Service: 07/02/2020 3:30 PM Medical Record Number: [id number] Patient Account Number: [id number] Date of Birth/Sex: 1936/04/02 ([name]) Treating RN: Cornell Barman Primary Care Provider: [name] Other Clinician: Referring Provider: [name] Treating Provider/Extender: Skipper Cliche in Treatment: 20 Information Obtained from: Patient Chief Complaint Pressure ulcer left heel Electronic Signature(s) Signed: 07/02/2020 5:15:09 PM By: Worthy Keeler PA-C Entered By: Worthy Keeler on 07/02/2020 17:15:08 Yanni, [name] ([id number]) -------------------------------------------------------------------------------- Debridement Details Patient Name: [name] Date of Service: 07/02/2020 3:30 PM Medical Record Number: [id number] Patient Account Number: [id number] Date of Birth/Sex: 1935/12/03 (84 y.o. Male) Treating RN: Carlene Coria Primary Care Provider: [name] Other Clinician: Referring Provider: [name] Treating Provider/Extender: Skipper Cliche in Treatment: 20 Debridement Performed for Wound #1 Left,Plantar Calcaneus Assessment: Performed By: Physician Tommie Sams., PA-C Debridement Type: Debridement Level of Consciousness (Pre- Awake and Alert procedure): Pre-procedure Verification/Time Out Yes - 16:26 Taken: Start Time: 16:26 Pain Control: Lidocaine 5% topical ointment Total Area Debrided (L x W): 0.4 (cm) x 0.4 (cm) = 0.16 (cm) Tissue and other material Viable, Non-Viable, Slough, Subcutaneous, Skin: Dermis , Skin: Epidermis, Slough debrided: Level: Skin/Subcutaneous Tissue Debridement Description: Excisional Instrument: Curette Bleeding: Moderate Hemostasis Achieved: Pressure End Time: 16:30 Procedural Pain: 0 Post Procedural Pain: 0 Response to Treatment: Procedure was tolerated  well Level of Consciousness (Post- Awake and Alert procedure): Post Debridement Measurements of Total Wound Length: (cm) 0.4 Width: (cm) 0.4 Depth: (cm) 0.5 Volume: (cm) 0.063 Character of Wound/Ulcer Post Debridement: Improved Post Procedure Diagnosis Same as Pre-procedure Electronic Signature(s) Signed: 07/02/2020 6:06:24 PM By: Worthy Keeler PA-C Signed: 07/03/2020 10:25:07 AM By: Carlene Coria RN Entered By: Carlene Coria on 07/02/2020 16:31:23 Crisman, [name] ([id number]) -------------------------------------------------------------------------------- HPI Details Patient Name: [name] Date of Service: 07/02/2020 3:30 PM Medical Record Number: [id number] Patient Account Number: [id number] Date of Birth/Sex: 1936/05/25 (84 y.o. Male) Treating RN: Cornell Barman Primary Care Provider: [name] Other Clinician: Referring Provider: [name] Treating Provider/Extender: Skipper Cliche in Treatment: 20 History of Present Illness HPI Description: 02/11/2020 on evaluation today patient appears to be doing somewhat poorly on initial evaluation concerning his heel. He has been using a topical antibiotic ointment which does not seem to really have been doing the best job for him. He tells me that he had a wound that occurred as a result of his padding in the bottom of his shoe wear his foot drop brace goes actually had worn through and his foot was actually rubbing on the bottom of the shoe on the brace itself. Subsequently this ended up with a wound and he in turn had a lot of issues following. Fortunately there is no signs of active infection at this time which is good news in my opinion. With that being said the wound does appear to be somewhat moist with some evidence of maceration unfortunately. He has no pain he does have neuropathy. He also has bilateral foot drop which she wears braces. This does appear to be however a pressure injury secondary to the AFO brace.  The patient has been seen by podiatry since May 2021 unfortunately this is not healing hence the reason that he is coming to see [place] at this point to see if there is anything we can do to help in this regard. 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 9/15; the patient missed his appointment last week due to illness with his wife who had to be hospitalized. We are using Hydrofera Blue to the small punched out area in the middle of the left plantar heel. 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 This is felt to be pressure from the brace itself. 03/05/2020 upon evaluation today patient appears to be doing well in regard to his wound on the heel. He has been tolerating the dressing changes without complication which is great news. Fortunately there is no signs of active infection which I am very happy with. 03/19/2020 upon evaluation today patient appears to be doing a little worse in regard to his heel based on what I am seeing at this point. There actually appears to be a pocket off to the side laterally that is problematic for him at this point. I am not to clear this way with some sharp debridement currently. The patient was in agreement with that plan. 10/20; patient I do not usually see. He has a small wound on the left plantar heel. Some depth. He is apparently completing antibiotics. He has bilateral foot drop and therefore is AFO braces limiting what we can do to offload this. 04/14/2020 upon evaluation today patient's heel actually appears to be doing fairly well. There is no signs of active infection at this time which is great news and overall I feel like he is managing nicely. The only issue he has is that the individual who has been helping change the dressings at home unfortunately has not been available for the past week or so. She is out of town. Nonetheless he has been going  to urgent care to have the dressings changed as he is not able to do this on his own and states that his wife is unable to do it for him. 11/10; patient in today for a nurse visit but I was asked to look at him. I note that we are treating him for a small punched-out area on the left plantar heel. He arrived with a new superficial area close to this wound I am not really clear of the exact etiology of this. The bigger problem was increased swelling warmth of the lower leg below the knee but intense erythema involving the entire anterior knee and medial thigh. His entire leg is warm and swollen. However it was not completely obvious to me that there was a big change in the lower leg however the nurses and the patient certainly thought there was. The patient stated that after his 2-week of trimethoprim sulfamethoxazole his legs looked very similar. His vital signs are stable he is afebrile but nevertheless if this is all infection this is extensive and failing outpatient management 04/28/2020 on evaluation today patient presents for follow-up after having been seen in the ER and admitted to the hospital due to cellulitis in the left knee. He has continued to have issues at this point with regard to his knee it still very hot to touch upon evaluation today. His heel ulcer is actually measuring smaller than the last time I saw him but he still is having a lot of difficulty with dry and flaky skin over his feet in general. 05/05/2020 on evaluation today patient appears to be doing pretty well in regard to his heel ulcer. This is measuring a little smaller which is good news. Fortunately there is no signs of active infection at this time. No fevers,  chills, nausea, vomiting, or diarrhea. He does have a new area on his right dorsal surface of his foot. With that being said this region we really do not know exactly what happened where this came from it appears to be somewhat abnormal in that there is a very  well demarcated line that almost looks as if he could have had a burn or something of the sort that was partially protected 1 area but got burned over a different region. Either way I think we can probably get this healed fairly quickly. 05/14/2020 on evaluation today patient appears to be doing about the same in regard to his heel ulcer. We may need to try something different to see if this will be beneficial for him. I am thinking of switching him to collagen. 05/21/2020 on evaluation today patient appears to be doing excellent in regard to his foot ulcer from the standpoint of the overall appearance with that being said were just having some trouble getting it to granulate in. I do believe he would benefit from potentially utilizing a skin substitute though at this point in time at the end of the year I think it may be best to hold off till next year if we need to do this but I am going to switch things out a little bit regular try endoform to see if this will be beneficial for him. 05/28/2020 upon evaluation today patient's wound actually appears to be doing fairly well is very healthy and I am pleased with the overall appearance in that regard. With that being said I do not see any signs of active infection at this time which is great news. No fevers, chills, nausea, vomiting, or diarrhea. Unfortunately the adhesive is sticking to his skin around the wound and causing some minor skin tearing. We need to avoid this if at all possible. 06/04/2020 upon evaluation today patient appears to be doing really about the same in regard to his wound. With that being said there was some callus around the edges of the wound that I did decide to go ahead and tried to clean away and the patient actually tolerated me doing that today. I described what was found in the physical exam/assessment portion of this note 06/20/2019 upon evaluation today patient's heel ulcer actually showed signs of having some callus buildup  around the edges of his wound though the wound itself appears to be doing a little bit better in my opinion. There is no signs of active infection at this time. Overall the size of the wound is not significantly improved but again the quality does appear to be better. ALANMICHAEL, SPINDEL ([id number]) 07/02/2020 upon evaluation today patient's wound actually is showing signs of improvement which is great news. I do feel like it is doing quite well. No fevers, chills, nausea, vomiting, or diarrhea. Electronic Signature(s) Signed: 07/02/2020 5:31:07 PM By: Worthy Keeler PA-C Entered By: Worthy Keeler on 07/02/2020 17:31:06 [name] ([id number]) -------------------------------------------------------------------------------- Physical Exam Details Patient Name: [name] Date of Service: 07/02/2020 3:30 PM Medical Record Number: [id number] Patient Account Number: [id number] Date of Birth/Sex: 20-Mar-1936 (84 y.o. Male) Treating RN: Cornell Barman Primary Care Provider: [name] Other Clinician: Referring Provider: [name] Treating Provider/Extender: Skipper Cliche in Treatment: 23 Constitutional Well-nourished and well-hydrated in no acute distress. Respiratory normal breathing without difficulty. Psychiatric this patient is able to make decisions and demonstrates good insight into disease process. Alert and Oriented x 3. pleasant and cooperative. Notes Upon inspection patient's wound  bed actually showed signs of excellent granulation and epithelization I do not see any evidence of infection currently and in general I feel like the patient is making great progress. Electronic Signature(s) Signed: 07/02/2020 5:31:32 PM By: Worthy Keeler PA-C Entered By: Worthy Keeler on 07/02/2020 17:31:32 Sanda, [name] ([id number]) -------------------------------------------------------------------------------- Physician Orders Details Patient Name: [name] Date of Service:  07/02/2020 3:30 PM Medical Record Number: [id number] Patient Account Number: [id number] Date of Birth/Sex: Feb 16, 1936 ([name]) Treating RN: Carlene Coria Primary Care Provider: [name] Other Clinician: Referring Provider: [name] Treating Provider/Extender: Skipper Cliche in Treatment: 20 Verbal / Phone Orders: No Diagnosis Coding Follow-up Appointments o Return Appointment in 1 week. Bathing/ Shower/ Hygiene o May shower; gently cleanse wound with antibacterial soap, rinse and pat dry prior to dressing wounds Edema Control - Lymphedema / Segmental Compressive Device / Other o Elevate, Exercise Daily and Avoid Standing for Long Periods of Time. Wound Treatment Wound #1 - Calcaneus Wound Laterality: Plantar, Left Primary Dressing: Prisma 4.34 (in) 3 x Per Week Discharge Instructions: Moisten w/normal saline or sterile water; Cover wound as directed. Do not remove from wound bed. Secondary Dressing: Foam Dressing, 4x4 (in/in) (Generic) 3 x Per Week Secured With: 69M Medipore H Soft Cloth Surgical Tape, 2x2 (in/yd) (Generic) 3 x Per Week Electronic Signature(s) Signed: 07/02/2020 6:06:24 PM By: Worthy Keeler PA-C Signed: 07/03/2020 10:25:07 AM By: Carlene Coria RN Entered By: Carlene Coria on 07/02/2020 16:51:12 Ohern, [name] ([id number]) -------------------------------------------------------------------------------- Problem List Details Patient Name: [name] Date of Service: 07/02/2020 3:30 PM Medical Record Number: [id number] Patient Account Number: [id number] Date of Birth/Sex: 23-Feb-1936 (84 y.o. Male) Treating RN: Cornell Barman Primary Care Provider: [name] Other Clinician: Referring Provider: [name] Treating Provider/Extender: Skipper Cliche in Treatment: 20 Active Problems ICD-10 Encounter Code Description Active Date MDM Diagnosis [phone] Pressure ulcer of left heel, stage 3 02/11/2020 No Yes L97.512  Non-pressure chronic ulcer of other part of right foot with fat layer 05/05/2020 No Yes exposed G60.3 Idiopathic progressive neuropathy 02/11/2020 No Yes M21.372 Foot drop, left foot 02/11/2020 No Yes M21.371 Foot drop, right foot 02/11/2020 No Yes L03.116 Cellulitis of left lower limb 04/22/2020 No Yes Inactive Problems Resolved Problems Electronic Signature(s) Signed: 07/02/2020 5:15:01 PM By: Worthy Keeler PA-C Entered By: Worthy Keeler on 07/02/2020 17:15:01 Babineau, [name] ([id number]) -------------------------------------------------------------------------------- Progress Note Details Patient Name: [name] Date of Service: 07/02/2020 3:30 PM Medical Record Number: [id number] Patient Account Number: [id number] Date of Birth/Sex: 04-24-1936 (84 y.o. Male) Treating RN: Cornell Barman Primary Care Provider: [name] Other Clinician: Referring Provider: [name] Treating Provider/Extender: Skipper Cliche in Treatment: 20 Subjective Chief Complaint Information obtained from Patient Pressure ulcer left heel History of Present Illness (HPI) 02/11/2020 on evaluation today patient appears to be doing somewhat poorly on initial evaluation concerning his heel. He has been using a topical antibiotic ointment which does not seem to really have been doing the best job for him. He tells me that he had a wound that occurred as a result of his padding in the bottom of his shoe wear his foot drop brace goes actually had worn through and his foot was actually rubbing on the bottom of the shoe on the brace itself. Subsequently this ended up with a wound and he in turn had a lot of issues following. Fortunately there is no signs of active infection at this time which is good news in my opinion. With that being said the wound  does appear to be somewhat moist with some evidence of maceration unfortunately. He has no pain he does have neuropathy. He also has bilateral foot drop  which she wears braces. This does appear to be however a pressure injury secondary to the AFO brace. The patient has been seen by podiatry since May 2021 unfortunately this is not healing hence the reason that he is coming to see [place] at this point to see if there is anything we can do to help in this regard. 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 9/15; the patient missed his appointment last week due to illness with his wife who had to be hospitalized. We are using Hydrofera Blue to the small punched out area in the middle of the left plantar heel. 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 This is felt to be pressure from the brace itself. 03/05/2020 upon evaluation today patient appears to be doing well in regard to his wound on the heel. He has been tolerating the dressing changes without complication which is great news. Fortunately there is no signs of active infection which I am very happy with. 03/19/2020 upon evaluation today patient appears to be doing a little worse in regard to his heel based on what I am seeing at this point. There actually appears to be a pocket off to the side laterally that is problematic for him at this point. I am not to clear this way with some sharp debridement currently. The patient was in agreement with that plan. 10/20; patient I do not usually see. He has a small wound on the left plantar heel. Some depth. He is apparently completing antibiotics. He has bilateral foot drop and therefore is AFO braces limiting what we can do to offload this. 04/14/2020 upon evaluation today patient's heel actually appears to be doing fairly well. There is no signs of active infection at this time which is great news and overall I feel like he is managing nicely. The only issue he has is that the individual who has been helping change the dressings at home  unfortunately has not been available for the past week or so. She is out of town. Nonetheless he has been going to urgent care to have the dressings changed as he is not able to do this on his own and states that his wife is unable to do it for him. 11/10; patient in today for a nurse visit but I was asked to look at him. I note that we are treating him for a small punched-out area on the left plantar heel. He arrived with a new superficial area close to this wound I am not really clear of the exact etiology of this. The bigger problem was increased swelling warmth of the lower leg below the knee but intense erythema involving the entire anterior knee and medial thigh. His entire leg is warm and swollen. However it was not completely obvious to me that there was a big change in the lower leg however the nurses and the patient certainly thought there was. The patient stated that after his 2-week of trimethoprim sulfamethoxazole his legs looked very similar. His vital signs are stable he is afebrile but nevertheless if this is all infection this is extensive and failing outpatient management 04/28/2020 on evaluation today patient presents for follow-up after having been seen in the ER and admitted to the  hospital due to cellulitis in the left knee. He has continued to have issues at this point with regard to his knee it still very hot to touch upon evaluation today. His heel ulcer is actually measuring smaller than the last time I saw him but he still is having a lot of difficulty with dry and flaky skin over his feet in general. 05/05/2020 on evaluation today patient appears to be doing pretty well in regard to his heel ulcer. This is measuring a little smaller which is good news. Fortunately there is no signs of active infection at this time. No fevers, chills, nausea, vomiting, or diarrhea. He does have a new area on his right dorsal surface of his foot. With that being said this region we really do  not know exactly what happened where this came from it appears to be somewhat abnormal in that there is a very well demarcated line that almost looks as if he could have had a burn or something of the sort that was partially protected 1 area but got burned over a different region. Either way I think we can probably get this healed fairly quickly. 05/14/2020 on evaluation today patient appears to be doing about the same in regard to his heel ulcer. We may need to try something different to see if this will be beneficial for him. I am thinking of switching him to collagen. 05/21/2020 on evaluation today patient appears to be doing excellent in regard to his foot ulcer from the standpoint of the overall appearance with that being said were just having some trouble getting it to granulate in. I do believe he would benefit from potentially utilizing a skin substitute though at this point in time at the end of the year I think it may be best to hold off till next year if we need to do this but I am going to switch things out a little bit regular try endoform to see if this will be beneficial for him. 05/28/2020 upon evaluation today patient's wound actually appears to be doing fairly well is very healthy and I am pleased with the overall appearance in that regard. With that being said I do not see any signs of active infection at this time which is great news. No fevers, chills, nausea, vomiting, or diarrhea. Unfortunately the adhesive is sticking to his skin around the wound and causing some minor skin tearing. We need to avoid this if at all possible. 06/04/2020 upon evaluation today patient appears to be doing really about the same in regard to his wound. With that being said there was some callus around the edges of the wound that I did decide to go ahead and tried to clean away and the patient actually tolerated me doing that today. I described what was found in the physical exam/assessment portion of  this note MICHAL, BENESH ([id number]) 06/20/2019 upon evaluation today patient's heel ulcer actually showed signs of having some callus buildup around the edges of his wound though the wound itself appears to be doing a little bit better in my opinion. There is no signs of active infection at this time. Overall the size of the wound is not significantly improved but again the quality does appear to be better. 07/02/2020 upon evaluation today patient's wound actually is showing signs of improvement which is great news. I do feel like it is doing quite well. No fevers, chills, nausea, vomiting, or diarrhea. Objective Constitutional Well-nourished and well-hydrated in no acute distress. Vitals Time  Taken: 3:35 PM, Height: 72 in, Weight: 200 lbs, BMI: 27.1, Temperature: 98.2 F, Pulse: 97 bpm, Respiratory Rate: 16 breaths/min, Blood Pressure: 120/74 mmHg. Respiratory normal breathing without difficulty. Psychiatric this patient is able to make decisions and demonstrates good insight into disease process. Alert and Oriented x 3. pleasant and cooperative. General Notes: Upon inspection patient's wound bed actually showed signs of excellent granulation and epithelization I do not see any evidence of infection currently and in general I feel like the patient is making great progress. Integumentary (Hair, Skin) Wound #1 status is Open. Original cause of wound was Trauma. The wound is located on the Left,Plantar Calcaneus. The wound measures 0.4cm length x 0.4cm width x 0.5cm depth; 0.126cm^2 area and 0.063cm^3 volume. There is Fat Layer (Subcutaneous Tissue) exposed. There is no tunneling or undermining noted. There is a medium amount of sanguinous drainage noted. The wound margin is flat and intact. There is no granulation within the wound bed. There is no necrotic tissue within the wound bed. 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 There was a Excisional Skin/Subcutaneous Tissue Debridement with a total area of 0.16 sq cm performed by Tommie Sams., PA-C. With the following instrument(s): Curette to remove Viable and Non-Viable tissue/material. Material removed includes Subcutaneous Tissue, Slough, Skin: Dermis, and Skin: Epidermis after achieving pain control using Lidocaine 5% topical ointment. No specimens were taken. A time out was conducted at 16:26, prior to the start of the procedure. A Moderate amount of bleeding was controlled with Pressure. The procedure was tolerated well with a pain level of 0 throughout and a pain level of 0 following the procedure. Post Debridement Measurements: 0.4cm length x 0.4cm width x 0.5cm depth; 0.063cm^3 volume. Character of Wound/Ulcer Post Debridement is improved. Post procedure Diagnosis Wound #1: Same as Pre-Procedure Ridgley, Kydan ([id number]) Plan Follow-up Appointments: Return Appointment in 1 week. Bathing/ Shower/ Hygiene: May shower; gently cleanse wound with antibacterial soap, rinse and pat dry prior to dressing wounds Edema Control - Lymphedema / Segmental Compressive Device / Other: Elevate, Exercise Daily and Avoid Standing for Long Periods of Time. WOUND #1: - Calcaneus Wound Laterality: Plantar, Left Primary Dressing: Prisma 4.34 (in) 3 x Per Week/ Discharge Instructions: Moisten w/normal saline or sterile water; Cover wound as directed. Do not remove from wound bed. Secondary Dressing: Foam Dressing, 4x4 (in/in) (Generic) 3 x Per Week/ Secured With: 48M Medipore H Soft Cloth Surgical Tape, 2x2 (in/yd) (Generic) 3 x Per Week/ 1. Would recommend currently that we have the patient go ahead and continue with the  wound care measures as before specifically with regard to the silver collagen which I think is doing a good job for him. 2. Muscle can recommend that he continue with the foam to cover and then securing this with tape that seems to also be doing well. 3. I am also can recommend that the patient continue to elevate his legs much as possible to help with edema control. We will see patient back for reevaluation in 1 week here in the clinic. If anything worsens or changes patient will contact our office for additional recommendations. Electronic Signature(s) Signed: 07/02/2020 5:32:12 PM By: Worthy Keeler PA-C Entered By: Joaquim Lai  IIIMargarita Grizzle on 07/02/2020 17:32:11 JAZIYAH, FAUBLE ([id number]) -------------------------------------------------------------------------------- SuperBill Details Patient Name: [name] Date of Service: 07/02/2020 Medical Record Number: [id number] Patient Account Number: [id number] Date of Birth/Sex: [birth year] (84 y.o. 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, left foot M21.371 Foot drop, right foot L03.116 Cellulitis of left lower limb Facility Procedures CPT4 Code: [id number] Description: [id number] - DEB SUBQ TISSUE 20 SQ CM/< Modifier: Quantity: 1 CPT4 Code: Description: ICD-10 Diagnosis Description L89.623 Pressure ulcer of left heel, stage 3 Modifier: Quantity: Physician Procedures CPT4 Code: [id number] Description: 11042 - WC PHYS SUBQ TISS 20 SQ CM Modifier: Quantity: 1 CPT4 Code: Description: ICD-10 Diagnosis Description L89.623 Pressure ulcer of left heel, stage  3 Modifier: Quantity: Electronic Signature(s) Signed: 07/02/2020 5:39:15 PM By: Worthy Keeler PA-C Entered By: Worthy Keeler on 07/02/2020 17:39:15

## 2020-07-03 NOTE — Progress Notes (Signed)
KAI, CALICO ([id number]) Visit Report for 07/02/2020 Arrival Information Details Patient Name: Marcus, Holt Date of Service: 07/02/2020 3:30 PM Medical Record Number: [id number] Patient Account Number: [id number] Date of Birth/Sex: 10/23/1935 ([name]) Treating RN: Carlene Coria Primary Care Lilith Solana: [name] Other Clinician: Referring Antwonette Feliz: [name] Treating Pearlie Nies/Extender: Skipper Cliche in Treatment: 64 Visit Information History Since Last Visit All ordered tests and consults were completed: No Patient Arrived: [name] Added or deleted any medications: No Arrival Time: 15:35 Any new allergies or adverse reactions: No Accompanied By: self Had a fall or experienced change in No Transfer Assistance: None activities of daily living that may affect Patient Identification Verified: Yes risk of falls: Secondary Verification Process Completed: Yes Signs or symptoms of abuse/neglect since last visito No Patient Requires Transmission-Based Precautions: No Hospitalized since last visit: No Patient Has Alerts: No Implantable device outside of the clinic excluding No cellular tissue based products placed in the center since last visit: Has Dressing in Place as Prescribed: Yes Pain Present Now: No Electronic Signature(s) Signed: 07/03/2020 10:25:07 AM By: Carlene Coria RN Entered By: Carlene Coria on 07/02/2020 15:35:43 [name], [name] ([id number]) -------------------------------------------------------------------------------- Clinic Level of Care Assessment Details Patient Name: [name] Date of Service: 07/02/2020 3:30 PM Medical Record Number: [id number] Patient Account Number: [id number] Date of Birth/Sex: 11-04-1935 (84 y.o. Male) Treating RN: Carlene Coria Primary Care Kenyada Dosch: [name] Other Clinician: Referring Mahealani Sulak: [name] Treating Shivan Hodes/Extender: Skipper Cliche in Treatment: 20 Clinic Level of Care  Assessment Items TOOL 1 Quantity Score []  - Use when EandM and Procedure is performed on INITIAL visit 0 ASSESSMENTS - Nursing Assessment / Reassessment []  - General Physical Exam (combine w/ comprehensive assessment (listed just below) when performed on new 0 pt. evals) []  - 0 Comprehensive Assessment (HX, ROS, Risk Assessments, Wounds Hx, etc.) ASSESSMENTS - Wound and Skin Assessment / Reassessment []  - Dermatologic / Skin Assessment (not related to wound area) 0 ASSESSMENTS - Ostomy and/or Continence Assessment and Care []  - Incontinence Assessment and Management 0 []  - 0 Ostomy Care Assessment and Management (repouching, etc.) PROCESS - Coordination of Care []  - Simple Patient / Family Education for ongoing care 0 []  - 0 Complex (extensive) Patient / Family Education for ongoing care []  - 0 Staff obtains Programmer, systems, Records, Test Results / Process Orders []  - 0 Staff telephones HHA, Nursing Homes / Clarify orders / etc []  - 0 Routine Transfer to another Facility (non-emergent condition) []  - 0 Routine Hospital Admission (non-emergent condition) []  - 0 New Admissions / Biomedical engineer / Ordering NPWT, Apligraf, etc. []  - 0 Emergency Hospital Admission (emergent condition) PROCESS - Special Needs []  - Pediatric / Minor Patient Management 0 []  - 0 Isolation Patient Management []  - 0 Hearing / Language / Visual special needs []  - 0 Assessment of Community assistance (transportation, D/C planning, etc.) []  - 0 Additional assistance / Altered mentation []  - 0 Support Surface(s) Assessment (bed, cushion, seat, etc.) INTERVENTIONS - Miscellaneous []  - External ear exam 0 []  - 0 Patient Transfer (multiple staff / Civil Service fast streamer / Similar devices) []  - 0 Simple Staple / Suture removal (25 or less) []  - 0 Complex Staple / Suture removal (26 or more) []  - 0 Hypo/Hyperglycemic Management (do not check if billed separately) []  - 0 Ankle / Brachial Index (ABI) - do not  check if billed separately Has the patient been seen at the hospital within the last three years: Yes Total Score: 0 Level Of Care: ____ Estelle,  [name] ([id number]) Electronic Signature(s) Signed: 07/03/2020 10:25:07 AM By: Carlene Coria RN Entered By: Carlene Coria on 07/02/2020 16:53:15 Schlie, [name] ([id number]) -------------------------------------------------------------------------------- Lower Extremity Assessment Details Patient Name: [name] Date of Service: 07/02/2020 3:30 PM Medical Record Number: [id number] Patient Account Number: [id number] Date of Birth/Sex: 1935-12-24 (84 y.o. Male) Treating RN: Carlene Coria Primary Care Ted Goodner: [name] Other Clinician: Referring Nahla Lukin: [name] Treating Melayna Robarts/Extender: Skipper Cliche in Treatment: 20 Edema Assessment Assessed: [Left: No] Patrice Paradise: No] [Left: Edema] [Right: :] Calf Left: Right: Point of Measurement: From Medial Instep 41 cm Ankle Left: Right: Point of Measurement: From Medial Instep 25 cm Electronic Signature(s) Signed: 07/03/2020 10:25:07 AM By: Carlene Coria RN Entered By: Carlene Coria on 07/02/2020 15:37:47 [name], [name] ([id number]) -------------------------------------------------------------------------------- Multi Wound Chart Details Patient Name: [name] Date of Service: 07/02/2020 3:30 PM Medical Record Number: [id number] Patient Account Number: [id number] Date of Birth/Sex: 1936/01/30 ([name]) Treating RN: Carlene Coria Primary Care Emilygrace Grothe: [name] Other Clinician: Referring Caela Huot: [name] Treating Madison Direnzo/Extender: Skipper Cliche in Treatment: 20 Vital Signs Height(in): 30 Pulse(bpm): 40 Weight(lbs): 200 Blood Pressure(mmHg): 120/74 Body Mass Index(BMI): 27 Temperature(F): 98.2 Respiratory Rate(breaths/min): 16 Photos: [1:No Photos] [N/A:N/A] Wound Location: [1:Left, Plantar Calcaneus] [N/A:N/A] Wounding Event: [1:Trauma]  [N/A:N/A] Primary Etiology: [1:Trauma, Other] [N/A:N/A] Secondary Etiology: [1:Pressure Ulcer] [N/A:N/A] Comorbid History: [1:Hypertension, Myocardial Infarction, N/A Osteoarthritis, Neuropathy] Date Acquired: [1:10/21/2019] [N/A:N/A] Weeks of Treatment: [1:20] [N/A:N/A] Wound Status: [1:Open] [N/A:N/A] Measurements L x W x D (cm) [1:0.4x0.4x0.5] [N/A:N/A] Area (cm) : [1:0.126] [N/A:N/A] Volume (cm) : [1:0.063] [N/A:N/A] % Reduction in Area: [1:35.70%] [N/A:N/A] % Reduction in Volume: [1:20.30%] [N/A:N/A] Classification: [1:Full Thickness Without Exposed Support Structures] [N/A:N/A] Exudate Amount: [1:Medium] [N/A:N/A] Exudate Type: [1:Sanguinous] [N/A:N/A] Exudate Color: [1:red] [N/A:N/A] Wound Margin: [1:Flat and Intact] [N/A:N/A] Granulation Amount: [1:None Present (0%)] [N/A:N/A] Necrotic Amount: [1:None Present (0%)] [N/A:N/A] Exposed Structures: [1:Fat Layer (Subcutaneous Tissue): N/A Yes Fascia: No Tendon: No Muscle: No Joint: No Bone: No None] [N/A:N/A] Treatment Notes Electronic Signature(s) Signed: 07/03/2020 10:25:07 AM By: Carlene Coria RN Entered By: Carlene Coria on 07/02/2020 16:30:05 [name] ([id number]) -------------------------------------------------------------------------------- [place] Details Patient Name: [name] Date of Service: 07/02/2020 3:30 PM Medical Record Number: [id number] Patient Account Number: [id number] Date of Birth/Sex: Dec 10, 1935 ([name]) Treating RN: Carlene Coria Primary Care Sieanna Vanstone: [name] Other Clinician: Referring Lamiyah Schlotter: [name] Treating Vamsi Apfel/Extender: Skipper Cliche in Treatment: 20 Active Inactive Pressure Nursing Diagnoses: Potential for impaired tissue integrity related to pressure, friction, moisture, and shear Goals: Patient/caregiver will verbalize understanding of pressure ulcer management Date Initiated: 06/19/2020 Target Resolution Date:  07/10/2020 Goal Status: Active Interventions: Assess offloading mechanisms upon admission and as needed Notes: Electronic Signature(s) Signed: 07/03/2020 10:25:07 AM By: Carlene Coria RN Entered By: Carlene Coria on 07/02/2020 16:27:01 [name], [name] ([id number]) -------------------------------------------------------------------------------- Pain Assessment Details Patient Name: [name] Date of Service: 07/02/2020 3:30 PM Medical Record Number: [id number] Patient Account Number: [id number] Date of Birth/Sex: January 10, 1936 ([name]) Treating RN: Carlene Coria Primary Care Ranjit Ashurst: [name] Other Clinician: Referring Alizea Pell: [name] Treating Denine Brotz/Extender: Skipper Cliche in Treatment: 20 Active Problems Location of Pain Severity and Description of Pain Patient Has Paino No Site Locations Pain Management and Medication Current Pain Management: Electronic Signature(s) Signed: 07/03/2020 10:25:07 AM By: Carlene Coria RN Entered By: Carlene Coria on 07/02/2020 15:36:22 [name] ([id number]) -------------------------------------------------------------------------------- Patient/Caregiver Education Details Patient Name: [name] Date of Service: 07/02/2020 3:30 PM Medical Record Number: [id number] Patient Account Number: [id number] Date of Birth/Gender: 1936/04/21 (84  y.o. Male) Treating RN: Yevonne Pax Primary Care Physician: Saralyn Pilar Other Clinician: Referring Physician: Saralyn Pilar Treating Physician/Extender: Rowan Blase in Treatment: 20 Education Assessment Education Provided To: Patient Education Topics Provided Wound/Skin Impairment: Methods: Explain/Verbal Responses: State content correctly Electronic Signature(s) Signed: 07/03/2020 10:25:07 AM By: Yevonne Pax RN Entered By: Yevonne Pax on 07/02/2020 16:33:01 [name], [name]  ([id number]) -------------------------------------------------------------------------------- Wound Assessment Details Patient Name: [name] Date of Service: 07/02/2020 3:30 PM Medical Record Number: [id number] Patient Account Number: [id number] Date of Birth/Sex: Dec 12, 1935 ([name]) Treating RN: Yevonne Pax Primary Care Dacoda Finlay: Saralyn Pilar Other Clinician: Referring Travarus Trudo: Saralyn Pilar Treating Otis Portal/Extender: Rowan Blase in Treatment: 20 Wound Status Wound Number: 1 Primary Etiology: Trauma, Other Wound Location: Left, Plantar Calcaneus Secondary Pressure Ulcer Etiology: Wounding Event: Trauma Wound Status: Open Date Acquired: 10/21/2019 Comorbid Hypertension, Myocardial Infarction, Osteoarthritis, Weeks Of Treatment: 20 History: Neuropathy Clustered Wound: No Wound Measurements Length: (cm) 0.4 Width: (cm) 0.4 Depth: (cm) 0.5 Area: (cm) 0.126 Volume: (cm) 0.063 % Reduction in Area: 35.7% % Reduction in Volume: 20.3% Epithelialization: None Tunneling: No Undermining: No Wound Description Classification: Full Thickness Without Exposed Support Structures Wound Margin: Flat and Intact Exudate Amount: Medium Exudate Type: Sanguinous Exudate Color: red Foul Odor After Cleansing: No Slough/Fibrino Yes Wound Bed Granulation Amount: None Present (0%) Exposed Structure Necrotic Amount: None Present (0%) Fascia Exposed: No Fat Layer (Subcutaneous Tissue) Exposed: Yes Tendon Exposed: No Muscle Exposed: No Joint Exposed: No Bone Exposed: No Electronic Signature(s) Signed: 07/03/2020 10:25:07 AM By: Yevonne Pax RN Entered By: Yevonne Pax on 07/02/2020 15:36:57 [name], [name] ([id number]) -------------------------------------------------------------------------------- Vitals Details Patient Name: [name] Date of Service: 07/02/2020 3:30 PM Medical Record Number: [id number] Patient Account Number: [id number] Date of Birth/Sex:  December 02, 1935 (84 y.o. Male) Treating RN: Yevonne Pax Primary Care Yelitza Reach: Saralyn Pilar Other Clinician: Referring Anav Lammert: Saralyn Pilar Treating Halena Mohar/Extender: Rowan Blase in Treatment: 20 Vital Signs Time Taken: 15:35 Temperature (F): 98.2 Height (in): 72 Pulse (bpm): 97 Weight (lbs): 200 Respiratory Rate (breaths/min): 16 Body Mass Index (BMI): 27.1 Blood Pressure (mmHg): 120/74 Reference Range: 80 - 120 mg / dl Electronic Signature(s) Signed: 07/03/2020 10:25:07 AM By: Yevonne Pax RN Entered By: Yevonne Pax on 07/02/2020 15:36:10

## 2020-07-05 ENCOUNTER — Other Ambulatory Visit: Payer: Self-pay | Admitting: Family Medicine

## 2020-07-05 DIAGNOSIS — I10 Essential (primary) hypertension: Secondary | ICD-10-CM

## 2020-07-09 ENCOUNTER — Other Ambulatory Visit: Payer: Self-pay

## 2020-07-09 ENCOUNTER — Encounter: Payer: Medicare Other | Admitting: Physician Assistant

## 2020-07-09 DIAGNOSIS — L89623 Pressure ulcer of left heel, stage 3: Secondary | ICD-10-CM | POA: Diagnosis not present

## 2020-07-09 NOTE — Progress Notes (Signed)
[name] ([id number]) Visit Report for 07/09/2020 Arrival Information Details Patient Name: [name], [name] Date of Service: 07/09/2020 9:45 AM Medical Record Number: [id number] Patient Account Number: [id number] Date of Birth/Sex: Sep 01, 1935 (85 y.o. M) Treating RN: [name] Primary Care [name]: [name] Other Clinician: Referring [name]: [name] Treating [name]/Extender: [name] in Treatment: 21 Visit Information History Since Last Visit All ordered tests and consults were completed: No Patient Arrived: [name] Added or deleted any medications: No Arrival Time: 09:37 Any new allergies or adverse reactions: No Accompanied By: self Had a fall or experienced change in No Transfer Assistance: None activities of daily living that may affect Patient Identification Verified: Yes risk of falls: Secondary Verification Process Completed: Yes Signs or symptoms of abuse/neglect since last visito No Patient Requires Transmission-Based Precautions: No Hospitalized since last visit: No Patient Has Alerts: No Implantable device outside of the clinic excluding No cellular tissue based products placed in the center since last visit: Has Dressing in Place as Prescribed: Yes Pain Present Now: No Electronic Signature(s) Signed: 07/09/2020 11:33:31 AM By: Marcus Coria RN Entered By: [name] on 07/09/2020 09:43:04 [name], [name] ([id number]) -------------------------------------------------------------------------------- Encounter Discharge Information Details Patient Name: [name] Date of Service: 07/09/2020 9:45 AM Medical Record Number: [id number] Patient Account Number: [id number] Date of Birth/Sex: [birth year] (85 y.o. M) Treating RN: [name] Primary Care [name]: [name] Other Clinician: Referring [name]: [name] Treating [name]/Extender: [name] in Treatment: 21 Encounter Discharge Information  Items Post Procedure Vitals Discharge Condition: Stable Temperature (F): 97.9 Ambulatory Status: Walker Pulse (bpm): 87 Discharge Destination: Home Respiratory Rate (breaths/min): 18 Transportation: Private Auto Blood Pressure (mmHg): 153/91 Accompanied By: self Schedule Follow-up Appointment: Yes Clinical Summary of Care: Electronic Signature(s) Signed: 07/09/2020 12:23:05 PM By: [name], Marcus Breeding RN Entered By: [name] on 07/09/2020 10:14:26 [name] ([id number]) -------------------------------------------------------------------------------- Lower Extremity Assessment Details Patient Name: [name] Date of Service: 07/09/2020 9:45 AM Medical Record Number: [id number] Patient Account Number: [id number] Date of Birth/Sex: 05-31-1936 (85 y.o. M) Treating RN: [name] Primary Care Pasqualina Colasurdo: [name] Other Clinician: Referring [name]: [name] Treating Alivya Wegman/Extender: [name] in Treatment: 21 Edema Assessment Assessed: [Left: No] [name]: No] [Left: Edema] [Right: :] Calf Left: Right: Point of Measurement: From Medial Instep 45 cm Ankle Left: Right: Point of Measurement: From Medial Instep 21 cm Vascular Assessment Pulses: Dorsalis Pedis Palpable: [Left:Yes] Electronic Signature(s) Signed: 07/09/2020 11:33:31 AM By: Marcus Coria RN Entered By: [name] on 07/09/2020 09:52:54 [name] ([id number]) -------------------------------------------------------------------------------- Multi Wound Chart Details Patient Name: [name] Date of Service: 07/09/2020 9:45 AM Medical Record Number: [id number] Patient Account Number: [id number] Date of Birth/Sex: November 17, 1935 (85 y.o. M) Treating RN: [name] Primary Care [name]: [name] Other Clinician: Referring [name]: [name] Treating [name]/Extender: [name] in Treatment: 21 Vital Signs Height(in):  72 Pulse(bpm): 12 Weight(lbs): 200 Blood Pressure(mmHg): 153/91 Body Mass Index(BMI): 27 Temperature(F): 97.9 Respiratory Rate(breaths/min): 18 Photos: [N/A:N/A] Wound Location: Left, Plantar Calcaneus N/A N/A Wounding Event: Trauma N/A N/A Primary Etiology: Trauma, Other N/A N/A Secondary Etiology: Pressure Ulcer N/A N/A Comorbid History: Hypertension, Myocardial Infarction, N/A N/A Osteoarthritis, Neuropathy Date Acquired: 10/21/2019 N/A N/A Weeks of Treatment: 21 N/A N/A Wound Status: Open N/A N/A Measurements L x W x D (cm) 0.5x0.4x0.2 N/A N/A Area (cm) : 0.157 N/A N/A Volume (cm) : 0.031 N/A N/A % Reduction in Area: 19.90% N/A N/A % Reduction in Volume: 60.80% N/A N/A Classification: Full Thickness Without Exposed N/A N/A Support Structures  Exudate Amount: Medium N/A N/A Exudate Type: Serosanguineous N/A N/A Exudate Color: red, brown N/A N/A Wound Margin: Flat and Intact N/A N/A Granulation Amount: Large (67-100%) N/A N/A Granulation Quality: Pink N/A N/A Necrotic Amount: None Present (0%) N/A N/A Exposed Structures: Fat Layer (Subcutaneous Tissue): N/A N/A Yes Fascia: No Tendon: No Muscle: No Joint: No Bone: No Epithelialization: None N/A N/A Treatment Notes Electronic Signature(s) Signed: 07/09/2020 12:23:05 PM By: Marcus Haggis, Dondra Prader RN Entered By: [name] on 07/09/2020 10:04:51 [name] ([id number]) -------------------------------------------------------------------------------- Multi-Disciplinary Care Plan Details Patient Name: [name] Date of Service: 07/09/2020 9:45 AM Medical Record Number: [id number] Patient Account Number: [id number] Date of Birth/Sex: [birth year] (85 y.o. M) Treating RN: [name] Primary Care Myli Pae: [name] Other Clinician: Referring [name]: [name] Treating Yashika Mask/Extender: [name] in Treatment: 21 Active Inactive Pressure Nursing Diagnoses: Potential  for impaired tissue integrity related to pressure, friction, moisture, and shear Goals: Patient/caregiver will verbalize understanding of pressure ulcer management Date Initiated: 06/19/2020 Target Resolution Date: 07/10/2020 Goal Status: Active Interventions: Assess offloading mechanisms upon admission and as needed Notes: Electronic Signature(s) Signed: 07/09/2020 12:23:05 PM By: Marcus Haggis, Dondra Prader RN Entered By: [name] on 07/09/2020 10:04:39 [name] ([id number]) -------------------------------------------------------------------------------- Pain Assessment Details Patient Name: [name] Date of Service: 07/09/2020 9:45 AM Medical Record Number: [id number] Patient Account Number: [id number] Date of Birth/Sex: [birth year] (84 y.o. M) Treating RN: Yevonne Pax Primary Care Aydien Majette: [name] Other Clinician: Referring Lessa Huge: [name] Treating Hedda Crumbley/Extender: [name] in Treatment: 21 Active Problems Location of Pain Severity and Description of Pain Patient Has Paino No Site Locations Pain Management and Medication Current Pain Management: Electronic Signature(s) Signed: 07/09/2020 11:33:31 AM By: Yevonne Pax RN Entered By: Yevonne Pax on 07/09/2020 09:43:37 Mccoin, Peyton Najjar ([id number]) -------------------------------------------------------------------------------- Patient/Caregiver Education Details Patient Name: [name] Date of Service: 07/09/2020 9:45 AM Medical Record Number: [id number] Patient Account Number: [id number] Date of Birth/Gender: 1935-12-27 (84 y.o. M) Treating RN: [name] Primary Care Physician: [name] Other Clinician: Referring Physician: Saralyn Holt Treating Physician/Extender: [name] in Treatment: 21 Education Assessment Education Provided To: Patient Education Topics Provided Pressure: Methods: Explain/Verbal Responses: State content  correctly Electronic Signature(s) Signed: 07/09/2020 12:23:05 PM By: Marcus Haggis, Dondra Prader RN Entered By: [name] on 07/09/2020 10:13:47 [name] ([id number]) -------------------------------------------------------------------------------- Wound Assessment Details Patient Name: [name] Date of Service: 07/09/2020 9:45 AM Medical Record Number: [id number] Patient Account Number: [id number] Date of Birth/Sex: [birth year] (84 y.o. M) Treating RN: Yevonne Pax Primary Care Kymberlie Brazeau: [name] Other Clinician: Referring Linken Mcglothen: [name] Treating Latanga Nedrow/Extender: [name] in Treatment: 21 Wound Status Wound Number: 1 Primary Etiology: Trauma, Other Wound Location: Left, Plantar Calcaneus Secondary Pressure Ulcer Etiology: Wounding Event: Trauma Wound Status: Open Date Acquired: 10/21/2019 Comorbid Hypertension, Myocardial Infarction, Osteoarthritis, Weeks Of Treatment: 21 History: Neuropathy Clustered Wound: No Photos Wound Measurements Length: (cm) 0.5 Width: (cm) 0.4 Depth: (cm) 0.2 Area: (cm) 0.157 Volume: (cm) 0.031 % Reduction in Area: 19.9% % Reduction in Volume: 60.8% Epithelialization: None Tunneling: No Undermining: No Wound Description Classification: Full Thickness Without Exposed Support Structures Wound Margin: Flat and Intact Exudate Amount: Medium Exudate Type: Serosanguineous Exudate Color: red, brown Foul Odor After Cleansing: No Slough/Fibrino Yes Wound Bed Granulation Amount: Large (67-100%) Exposed Structure Granulation Quality: Pink Fascia Exposed: No Necrotic Amount: None Present (0%) Fat Layer (Subcutaneous Tissue) Exposed: Yes Tendon Exposed: No Muscle Exposed: No Joint Exposed: No Bone Exposed: No Treatment Notes Wound #1 (Calcaneus) Wound Laterality: Plantar, Left  Cleanser Normal Saline Discharge Instruction: Wash your hands with soap and water. Remove old dressing, discard into  plastic bag and place into trash. Cleanse the wound with Normal Saline prior to applying a clean dressing using gauze sponges, not tissues or cotton balls. Do not scrub or use excessive force. Pat dry using gauze sponges, not tissue or cotton balls. GEOVONNI, MEYERHOFF ([id number]) Peri-Wound Care Moisturizing Lotion Discharge Instruction: Suggestions: Theraderm, Eucerin, Cetaphil, or patient preference. Topical Primary Dressing Prisma 4.34 (in) Discharge Instruction: Moisten w/normal saline or sterile water; Cover wound as directed. Do not remove from wound bed. Secondary Dressing Foam Dressing, 4x4 (in/in) Secured With 35M Medipore H Soft Cloth Surgical Tape, 2x2 (in/yd) Compression Wrap Compression Stockings Add-Ons Electronic Signature(s) Signed: 07/09/2020 11:33:31 AM By: Marcus Coria RN Entered By: [name] on 07/09/2020 09:52:29 Crafton, [name] ([id number]) -------------------------------------------------------------------------------- [place] Details Patient Name: [name] Date of Service: 07/09/2020 9:45 AM Medical Record Number: [id number] Patient Account Number: [id number] Date of Birth/Sex: [birth year] (84 y.o. M) Treating RN: [name] Primary Care Smrithi Pigford: [name] Other Clinician: Referring Dacota Ruben: [name] Treating Derrius Furtick/Extender: [name] in Treatment: 21 Vital Signs Time Taken: 09:43 Temperature (F): 97.9 Height (in): 72 Pulse (bpm): 87 Weight (lbs): 200 Respiratory Rate (breaths/min): 18 Body Mass Index (BMI): 27.1 Blood Pressure (mmHg): 153/91 Reference Range: 80 - 120 mg / dl Electronic Signature(s) Signed: 07/09/2020 11:33:31 AM By: Marcus Coria RN Entered By: [name] on 07/09/2020 09:43:29

## 2020-07-09 NOTE — Progress Notes (Addendum)
KHALAN, STANDRIDGE ([id number]) Visit Report for 07/09/2020 Chief Complaint Document Details Patient Name: [name], [name] Date of Service: 07/09/2020 9:45 AM Medical Record Number: [id number] Patient Account Number: [id number] Date of Birth/Sex: July 08, 1935 (85 y.o. M) Treating RN: Cornell Barman Primary Care Provider: [name] Other Clinician: Referring Provider: [name] Treating Provider/Extender: Skipper Cliche in Treatment: 21 Information Obtained from: Patient Chief Complaint Pressure ulcer left heel Electronic Signature(s) Signed: 07/09/2020 10:00:00 AM By: Worthy Keeler PA-C Entered By: Worthy Keeler on 07/09/2020 10:00:00 [name] ([id number]) -------------------------------------------------------------------------------- Debridement Details Patient Name: [name] Date of Service: 07/09/2020 9:45 AM Medical Record Number: [id number] Patient Account Number: [id number] Date of Birth/Sex: Oct 26, 1935 (84 y.o. M) Treating RN: Dolan Amen Primary Care Provider: [name] Other Clinician: Referring Provider: [name] Treating Provider/Extender: Skipper Cliche in Treatment: 21 Debridement Performed for Wound #1 Left,Plantar Calcaneus Assessment: Performed By: Physician Tommie Sams., PA-C Debridement Type: Debridement Level of Consciousness (Pre- Awake and Alert procedure): Pre-procedure Verification/Time Out Yes - 10:03 Taken: Start Time: 10:03 Total Area Debrided (L x W): 0.5 (cm) x 0.4 (cm) = 0.2 (cm) Tissue and other material Viable, Non-Viable, Callus, Slough, Subcutaneous, Slough debrided: Level: Skin/Subcutaneous Tissue Debridement Description: Excisional Instrument: Curette Bleeding: Minimum Hemostasis Achieved: Pressure End Time: 10:04 Response to Treatment: Procedure was tolerated well Level of Consciousness (Post- Awake and Alert procedure): Post Debridement Measurements of Total Wound Length: (cm)  0.5 Width: (cm) 0.4 Depth: (cm) 0.3 Volume: (cm) 0.047 Character of Wound/Ulcer Post Debridement: Stable Post Procedure Diagnosis Same as Pre-procedure Electronic Signature(s) Signed: 07/09/2020 12:23:05 PM By: Charlett Nose RN Signed: 07/09/2020 5:24:05 PM By: Worthy Keeler PA-C Entered By: Georges Mouse, Minus Breeding on 07/09/2020 10:06:16 [name] ([id number]) -------------------------------------------------------------------------------- HPI Details Patient Name: [name] Date of Service: 07/09/2020 9:45 AM Medical Record Number: [id number] Patient Account Number: [id number] Date of Birth/Sex: 1935/12/09 (84 y.o. M) Treating RN: Cornell Barman Primary Care Provider: [name] Other Clinician: Referring Provider: [name] Treating Provider/Extender: Skipper Cliche in Treatment: 21 History of Present Illness HPI Description: 02/11/2020 on evaluation today patient appears to be doing somewhat poorly on initial evaluation concerning his heel. He has been using a topical antibiotic ointment which does not seem to really have been doing the best job for him. He tells me that he had a wound that occurred as a result of his padding in the bottom of his shoe wear his foot drop brace goes actually had worn through and his foot was actually rubbing on the bottom of the shoe on the brace itself. Subsequently this ended up with a wound and he in turn had a lot of issues following. Fortunately there is no signs of active infection at this time which is good news in my opinion. With that being said the wound does appear to be somewhat moist with some evidence of maceration unfortunately. He has no pain he does have neuropathy. He also has bilateral foot drop which she wears braces. This does appear to be however a pressure injury secondary to the AFO brace. The patient has been seen by podiatry since May 2021 unfortunately this is not healing hence the reason that  he is coming to see [place] at this point to see if there is anything we can do to help in this regard. 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 9/15; the patient missed his appointment last week  due to illness with his wife who had to be hospitalized. We are using Hydrofera Blue to the small punched out area in the middle of the left plantar heel. 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 This is felt to be pressure from the brace itself. 03/05/2020 upon evaluation today patient appears to be doing well in regard to his wound on the heel. He has been tolerating the dressing changes without complication which is great news. Fortunately there is no signs of active infection which I am very happy with. 03/19/2020 upon evaluation today patient appears to be doing a little worse in regard to his heel based on what I am seeing at this point. There actually appears to be a pocket off to the side laterally that is problematic for him at this point. I am not to clear this way with some sharp debridement currently. The patient was in agreement with that plan. 10/20; patient I do not usually see. He has a small wound on the left plantar heel. Some depth. He is apparently completing antibiotics. He has bilateral foot drop and therefore is AFO braces limiting what we can do to offload this. 04/14/2020 upon evaluation today patient's heel actually appears to be doing fairly well. There is no signs of active infection at this time which is great news and overall I feel like he is managing nicely. The only issue he has is that the individual who has been helping change the dressings at home unfortunately has not been available for the past week or so. She is out of town. Nonetheless he has been going to urgent care to have the dressings changed as he is not able to do this on his own and states that his  wife is unable to do it for him. 11/10; patient in today for a nurse visit but I was asked to look at him. I note that we are treating him for a small punched-out area on the left plantar heel. He arrived with a new superficial area close to this wound I am not really clear of the exact etiology of this. The bigger problem was increased swelling warmth of the lower leg below the knee but intense erythema involving the entire anterior knee and medial thigh. His entire leg is warm and swollen. However it was not completely obvious to me that there was a big change in the lower leg however the nurses and the patient certainly thought there was. The patient stated that after his 2-week of trimethoprim sulfamethoxazole his legs looked very similar. His vital signs are stable he is afebrile but nevertheless if this is all infection this is extensive and failing outpatient management 04/28/2020 on evaluation today patient presents for follow-up after having been seen in the ER and admitted to the hospital due to cellulitis in the left knee. He has continued to have issues at this point with regard to his knee it still very hot to touch upon evaluation today. His heel ulcer is actually measuring smaller than the last time I saw him but he still is having a lot of difficulty with dry and flaky skin over his feet in general. 05/05/2020 on evaluation today patient appears to be doing pretty well in regard to his heel ulcer. This is measuring a little smaller which is good news. Fortunately there is no signs of active infection at this time. No fevers, chills, nausea, vomiting, or diarrhea. He does have a new area on his right dorsal  surface of his foot. With that being said this region we really do not know exactly what happened where this came from it appears to be somewhat abnormal in that there is a very well demarcated line that almost looks as if he could have had a burn or something of the sort that was  partially protected 1 area but got burned over a different region. Either way I think we can probably get this healed fairly quickly. 05/14/2020 on evaluation today patient appears to be doing about the same in regard to his heel ulcer. We may need to try something different to see if this will be beneficial for him. I am thinking of switching him to collagen. 05/21/2020 on evaluation today patient appears to be doing excellent in regard to his foot ulcer from the standpoint of the overall appearance with that being said were just having some trouble getting it to granulate in. I do believe he would benefit from potentially utilizing a skin substitute though at this point in time at the end of the year I think it may be best to hold off till next year if we need to do this but I am going to switch things out a little bit regular try endoform to see if this will be beneficial for him. 05/28/2020 upon evaluation today patient's wound actually appears to be doing fairly well is very healthy and I am pleased with the overall appearance in that regard. With that being said I do not see any signs of active infection at this time which is great news. No fevers, chills, nausea, vomiting, or diarrhea. Unfortunately the adhesive is sticking to his skin around the wound and causing some minor skin tearing. We need to avoid this if at all possible. 06/04/2020 upon evaluation today patient appears to be doing really about the same in regard to his wound. With that being said there was some callus around the edges of the wound that I did decide to go ahead and tried to clean away and the patient actually tolerated me doing that today. I described what was found in the physical exam/assessment portion of this note 06/20/2019 upon evaluation today patient's heel ulcer actually showed signs of having some callus buildup around the edges of his wound though the wound itself appears to be doing a little bit better in my  opinion. There is no signs of active infection at this time. Overall the size of the wound is not significantly improved but again the quality does appear to be better. HENRI, NODARSE ([id number]) 07/02/2020 upon evaluation today patient's wound actually is showing signs of improvement which is great news. I do feel like it is doing quite well. No fevers, chills, nausea, vomiting, or diarrhea. 07/09/2020 upon evaluation today patient's wound on his heel actually appears to be doing quite well which is great news. There does not appear to be any signs of infection he does have a little bit of callus buildup again he we are limited in what we can do for the patient simply due to the fact that he unfortunately has to wear the braces for foot drop and this prevents appropriate and significant offloading. If we were able to do that I think this wound would already be healed nonetheless we are making some slight progress today. Electronic Signature(s) Signed: 07/09/2020 11:18:51 AM By: Worthy Keeler PA-C Entered By: Worthy Keeler on 07/09/2020 11:18:51 Kruckenberg, [name] ([id number]) -------------------------------------------------------------------------------- Physical Exam Details Patient Name: [name] Date of  Service: 07/09/2020 9:45 AM Medical Record Number: [id number] Patient Account Number: [id number] Date of Birth/Sex: Apr 04, 1936 (84 y.o. M) Treating RN: Cornell Barman Primary Care Provider: [name] Other Clinician: Referring Provider: [name] Treating Provider/Extender: Skipper Cliche in Treatment: 21 Constitutional Well-nourished and well-hydrated in no acute distress. Respiratory normal breathing without difficulty. Psychiatric this patient is able to make decisions and demonstrates good insight into disease process. Alert and Oriented x 3. pleasant and cooperative. Notes His wound bed did require sharp debridement to remove some of the callus from around the  edges of the wound. He tolerated that today without complication post debridement the wound bed appears to be doing significantly better which is great news. Electronic Signature(s) Signed: 07/09/2020 11:19:04 AM By: Worthy Keeler PA-C Entered By: Worthy Keeler on 07/09/2020 11:19:04 [name] ([id number]) -------------------------------------------------------------------------------- Physician Orders Details Patient Name: [name] Date of Service: 07/09/2020 9:45 AM Medical Record Number: [id number] Patient Account Number: [id number] Date of Birth/Sex: 05-Oct-1935 (84 y.o. 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 Bathing/ Shower/ Hygiene o May shower; gently cleanse wound with antibacterial soap, rinse and pat dry prior to dressing wounds Edema Control - Lymphedema / Segmental Compressive Device / Other o Elevate, Exercise Daily and Avoid Standing for Long Periods of Time. Wound Treatment Wound #1 - Calcaneus Wound Laterality: Plantar, Left Cleanser: Normal Saline (Generic) 3 x Per Week/30 Days Discharge Instructions: Wash your hands with soap and water. Remove old dressing, discard into plastic bag and place into trash. Cleanse the wound with Normal Saline prior to applying a clean dressing using gauze sponges, not tissues or cotton balls. Do not scrub or use excessive force. Pat dry using gauze sponges, not tissue or  cotton balls. Peri-Wound Care: Moisturizing Lotion (Generic) 3 x Per Week/30 Days Discharge Instructions: Suggestions: Theraderm, Eucerin, Cetaphil, or patient preference. Primary Dressing: Prisma 4.34 (in) (Generic) 3 x Per Week/30 Days Discharge Instructions: Moisten w/normal saline or sterile water; Cover wound as directed. Do not remove from wound bed. Secondary Dressing: Foam Dressing, 4x4 (in/in) (Generic) 3 x Per Week/30 Days Secured With: 25M Medipore H Soft Cloth Surgical Tape, 2x2 (in/yd) (Generic) 3 x Per Week/30 Days Electronic Signature(s) Signed: 07/09/2020 12:23:05 PM By: Georges Mouse, Minus Breeding RN Signed: 07/09/2020 5:24:05 PM By: Worthy Keeler PA-C Entered By: Georges Mouse, Minus Breeding on 07/09/2020 10:07:03 [name] ([id number]) -------------------------------------------------------------------------------- Problem List Details Patient Name: [name] Date of Service: 07/09/2020 9:45 AM Medical Record Number: [id number] Patient Account Number: [id number] Date of Birth/Sex: [birth year] (84 y.o. 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 02/11/2020 No Yes M21.372 Foot drop, left foot 02/11/2020 No Yes M21.371 Foot drop, right foot 02/11/2020 No Yes L03.116 Cellulitis of left  lower limb 04/22/2020 No Yes Inactive Problems Resolved Problems Electronic Signature(s) Signed: 07/09/2020 9:59:52 AM By: Worthy Keeler PA-C Entered By: Worthy Keeler on 07/09/2020 09:59:52 Rivkin, Karman  ([id number]) -------------------------------------------------------------------------------- Progress Note Details Patient Name: [name] Date of Service: 07/09/2020 9:45 AM Medical Record Number: [id number] Patient Account Number: [id number] Date of Birth/Sex: [birth year] (84 y.o. M) Treating RN: Cornell Barman Primary Care Provider: [name] Other Clinician: Referring Provider: [name] Treating Provider/Extender: Skipper Cliche in Treatment: 21 Subjective Chief Complaint Information obtained from Patient Pressure ulcer left heel History of Present Illness (HPI) 02/11/2020 on evaluation today patient appears to be doing somewhat poorly on initial evaluation concerning his heel. He has been using a topical antibiotic ointment which does not seem to really have been doing the best job for him. He tells me that he had a wound that occurred as a result of his padding in the bottom of his shoe wear his foot drop brace goes actually had worn through and his foot was actually rubbing on the bottom of the shoe on the brace itself. Subsequently this ended up with a wound and he in turn had a lot of issues following. Fortunately there is no signs of active infection at this time which is good news in my opinion. With that being said the wound does appear to be somewhat moist with some evidence of maceration unfortunately. He has no pain he does have neuropathy. He also has bilateral foot drop which she wears braces. This does appear to be however a pressure injury secondary to the AFO brace. The patient has been seen by podiatry since May 2021 unfortunately this is not healing hence the reason that he is coming to see [place] at this point to see if there is anything we can do to help in this regard. 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 9/15; the patient missed his appointment last week due  to illness with his wife who had to be hospitalized. We are using Hydrofera Blue to the small punched out area in the middle of the left plantar heel. 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 This is felt to be pressure from the brace itself. 03/05/2020 upon evaluation today patient appears to be doing well in regard to his wound on the heel. He has been tolerating the dressing changes without complication which is great news. Fortunately there is no signs of active infection which I am very happy with. 03/19/2020 upon evaluation today patient appears to be doing a little worse in regard to his heel based on what I am seeing at this point. There actually appears to be a pocket off to the side laterally that is problematic for him at this point. I am not to clear this way with some sharp debridement currently. The patient was in agreement with that plan. 10/20; patient I do not usually see. He has a small wound on the left plantar heel. Some depth. He is apparently completing antibiotics. He has bilateral foot drop and therefore is AFO braces limiting what we can do to offload this. 04/14/2020 upon evaluation today patient's heel actually appears to be doing fairly well. There is no signs of active infection at this time which is great news and overall I feel like he is managing nicely. The only issue he has is that the individual who has been helping change  the dressings at home unfortunately has not been available for the past week or so. She is out of town. Nonetheless he has been going to urgent care to have the dressings changed as he is not able to do this on his own and states that his wife is unable to do it for him. 11/10; patient in today for a nurse visit but I was asked to look at him. I note that we are treating him for a small punched-out area on the left plantar heel. He arrived with a new superficial area close to this wound I am not really clear of the  exact etiology of this. The bigger problem was increased swelling warmth of the lower leg below the knee but intense erythema involving the entire anterior knee and medial thigh. His entire leg is warm and swollen. However it was not completely obvious to me that there was a big change in the lower leg however the nurses and the patient certainly thought there was. The patient stated that after his 2-week of trimethoprim sulfamethoxazole his legs looked very similar. His vital signs are stable he is afebrile but nevertheless if this is all infection this is extensive and failing outpatient management 04/28/2020 on evaluation today patient presents for follow-up after having been seen in the ER and admitted to the hospital due to cellulitis in the left knee. He has continued to have issues at this point with regard to his knee it still very hot to touch upon evaluation today. His heel ulcer is actually measuring smaller than the last time I saw him but he still is having a lot of difficulty with dry and flaky skin over his feet in general. 05/05/2020 on evaluation today patient appears to be doing pretty well in regard to his heel ulcer. This is measuring a little smaller which is good news. Fortunately there is no signs of active infection at this time. No fevers, chills, nausea, vomiting, or diarrhea. He does have a new area on his right dorsal surface of his foot. With that being said this region we really do not know exactly what happened where this came from it appears to be somewhat abnormal in that there is a very well demarcated line that almost looks as if he could have had a burn or something of the sort that was partially protected 1 area but got burned over a different region. Either way I think we can probably get this healed fairly quickly. 05/14/2020 on evaluation today patient appears to be doing about the same in regard to his heel ulcer. We may need to try something different to see if  this will be beneficial for him. I am thinking of switching him to collagen. 05/21/2020 on evaluation today patient appears to be doing excellent in regard to his foot ulcer from the standpoint of the overall appearance with that being said were just having some trouble getting it to granulate in. I do believe he would benefit from potentially utilizing a skin substitute though at this point in time at the end of the year I think it may be best to hold off till next year if we need to do this but I am going to switch things out a little bit regular try endoform to see if this will be beneficial for him. 05/28/2020 upon evaluation today patient's wound actually appears to be doing fairly well is very healthy and I am pleased with the overall appearance in that regard. With that being  said I do not see any signs of active infection at this time which is great news. No fevers, chills, nausea, vomiting, or diarrhea. Unfortunately the adhesive is sticking to his skin around the wound and causing some minor skin tearing. We need to avoid this if at all possible. 06/04/2020 upon evaluation today patient appears to be doing really about the same in regard to his wound. With that being said there was some callus around the edges of the wound that I did decide to go ahead and tried to clean away and the patient actually tolerated me doing that today. I described what was found in the physical exam/assessment portion of this note [name], [name] ([id number]) 06/20/2019 upon evaluation today patient's heel ulcer actually showed signs of having some callus buildup around the edges of his wound though the wound itself appears to be doing a little bit better in my opinion. There is no signs of active infection at this time. Overall the size of the wound is not significantly improved but again the quality does appear to be better. 07/02/2020 upon evaluation today patient's wound actually is showing signs of improvement which  is great news. I do feel like it is doing quite well. No fevers, chills, nausea, vomiting, or diarrhea. 07/09/2020 upon evaluation today patient's wound on his heel actually appears to be doing quite well which is great news. There does not appear to be any signs of infection he does have a little bit of callus buildup again he we are limited in what we can do for the patient simply due to the fact that he unfortunately has to wear the braces for foot drop and this prevents appropriate and significant offloading. If we were able to do that I think this wound would already be healed nonetheless we are making some slight progress today. Objective Constitutional Well-nourished and well-hydrated in no acute distress. Vitals Time Taken: 9:43 AM, Height: 72 in, Weight: 200 lbs, BMI: 27.1, Temperature: 97.9 F, Pulse: 87 bpm, Respiratory Rate: 18 breaths/min, Blood Pressure: 153/91 mmHg. Respiratory normal breathing without difficulty. Psychiatric this patient is able to make decisions and demonstrates good insight into disease process. Alert and Oriented x 3. pleasant and cooperative. General Notes: His wound bed did require sharp debridement to remove some of the callus from around the edges of the wound. He tolerated that today without complication post debridement the wound bed appears to be doing significantly better which is great news. Integumentary (Hair, Skin) Wound #1 status is Open. Original cause of wound was Trauma. The wound is located on the Left,Plantar Calcaneus. The wound measures 0.5cm length x 0.4cm width x 0.2cm depth; 0.157cm^2 area and 0.031cm^3 volume. There is Fat Layer (Subcutaneous Tissue) exposed. There is no tunneling or undermining noted. There is a medium amount of serosanguineous drainage noted. The wound margin is flat and intact. There is large (67-100%) pink granulation within the wound bed. There is no necrotic tissue within the wound bed. 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 There was a Excisional Skin/Subcutaneous Tissue Debridement with a total area of 0.2 sq cm performed by Tommie Sams., PA-C. With the following instrument(s): Curette to remove Viable and Non-Viable tissue/material.  Material removed includes Callus, Subcutaneous Tissue, and Slough. A time out was conducted at 10:03, prior to the start of the procedure. A Minimum amount of bleeding was controlled with Pressure. The procedure was tolerated well. Post Debridement Measurements: 0.5cm length x 0.4cm width x 0.3cm depth; 0.047cm^3 volume. Character of Wound/Ulcer Post Debridement is stable. Narula, [name] ([id number]) Post procedure Diagnosis Wound #1: Same as Pre-Procedure Plan Follow-up Appointments: Return Appointment in 1 week. Bathing/ Shower/ Hygiene: May shower; gently cleanse wound with antibacterial soap, rinse and pat dry prior to dressing wounds Edema Control - Lymphedema / Segmental Compressive Device / Other: Elevate, Exercise Daily and Avoid Standing for Long Periods of Time. WOUND #1: - Calcaneus Wound Laterality: Plantar, Left Cleanser: Normal Saline (Generic) 3 x Per Week/30 Days Discharge Instructions: Wash your hands with soap and water. Remove old dressing, discard into plastic bag and place into trash. Cleanse the wound with Normal Saline prior to applying a clean dressing using gauze sponges, not tissues or cotton balls. Do not scrub or use excessive force. Pat dry using gauze sponges, not tissue or cotton balls. Peri-Wound Care: Moisturizing Lotion (Generic) 3 x Per Week/30 Days Discharge Instructions: Suggestions: Theraderm,  Eucerin, Cetaphil, or patient preference. Primary Dressing: Prisma 4.34 (in) (Generic) 3 x Per Week/30 Days Discharge Instructions: Moisten w/normal saline or sterile water; Cover wound as directed. Do not remove from wound bed. Secondary Dressing: Foam Dressing, 4x4 (in/in) (Generic) 3 x Per Week/30 Days Secured With: 32M Medipore H Soft Cloth Surgical Tape, 2x2 (in/yd) (Generic) 3 x Per Week/30 Days 1. Would recommend currently that we go ahead and continue with the wound care measures as before were using the collagen and a silver form which I think is doing a good job. 2. We will cover this with a piece of foam nonadherent and then secured with tape he feels like this does the best for him. We will see patient back for reevaluation in 1 week here in the clinic. If anything worsens or changes patient will contact our office for additional recommendations. Electronic Signature(s) Signed: 07/09/2020 11:19:28 AM By: Worthy Keeler PA-C Entered By: Worthy Keeler on 07/09/2020 11:19:28 Phommachanh, [name] ([id number]) -------------------------------------------------------------------------------- SuperBill Details Patient Name: [name] Date of Service: 07/09/2020 Medical Record Number: [id number] Patient Account Number: [id number] Date of Birth/Sex: 11-08-1935 (84 y.o. M) Treating RN: Dolan Amen Primary Care Provider: [name] Other Clinician: Referring Provider: [name] Treating Provider/Extender: Skipper Cliche in Treatment: 21 Diagnosis Coding ICD-10 Codes Code Description [phone] Pressure ulcer of left heel, stage 3 L97.512 Non-pressure chronic ulcer of other part of right foot with fat layer exposed G60.3 Idiopathic progressive neuropathy M21.372 Foot drop, left foot M21.371 Foot drop, right foot L03.116 Cellulitis of left lower limb Facility Procedures CPT4 Code: [id number] Description: [id number] - DEB SUBQ TISSUE 20 SQ CM/< Modifier: Quantity: 1 CPT4  Code: Description: ICD-10 Diagnosis Description L89.623 Pressure ulcer of left heel, stage 3 Modifier: Quantity: Physician Procedures CPT4 Code: [id number] Description: 11042 - WC PHYS SUBQ TISS 20 SQ CM Modifier: Quantity: 1 CPT4 Code: Description: ICD-10 Diagnosis Description L89.623 Pressure ulcer of left heel, stage 3 Modifier: Quantity: Electronic Signature(s) Signed: 07/09/2020 11:19:35 AM By: Worthy Keeler PA-C Entered By: Worthy Keeler on 07/09/2020 11:19:35

## 2020-07-16 ENCOUNTER — Encounter: Payer: Medicare Other | Attending: Physician Assistant | Admitting: Physician Assistant

## 2020-07-16 ENCOUNTER — Other Ambulatory Visit: Payer: Self-pay

## 2020-07-16 DIAGNOSIS — G603 Idiopathic progressive neuropathy: Secondary | ICD-10-CM | POA: Insufficient documentation

## 2020-07-16 DIAGNOSIS — L89623 Pressure ulcer of left heel, stage 3: Secondary | ICD-10-CM | POA: Insufficient documentation

## 2020-07-16 DIAGNOSIS — M21372 Foot drop, left foot: Secondary | ICD-10-CM | POA: Diagnosis not present

## 2020-07-16 DIAGNOSIS — L97512 Non-pressure chronic ulcer of other part of right foot with fat layer exposed: Secondary | ICD-10-CM | POA: Diagnosis not present

## 2020-07-16 DIAGNOSIS — I10 Essential (primary) hypertension: Secondary | ICD-10-CM | POA: Diagnosis not present

## 2020-07-16 DIAGNOSIS — L03116 Cellulitis of left lower limb: Secondary | ICD-10-CM | POA: Diagnosis not present

## 2020-07-16 DIAGNOSIS — M21371 Foot drop, right foot: Secondary | ICD-10-CM | POA: Insufficient documentation

## 2020-07-16 DIAGNOSIS — I252 Old myocardial infarction: Secondary | ICD-10-CM | POA: Insufficient documentation

## 2020-07-16 NOTE — Progress Notes (Signed)
[name], [name] ([id number]) Visit Report for 07/16/2020 Arrival Information Details Patient Name: [name], [name] Date of Service: 07/16/2020 9:00 AM Medical Record Number: [id number] Patient Account Number: [id number] Date of Birth/Sex: [birth year] (85 y.o. M) Treating Holt: [name] Primary Care [name]: [name] Other Clinician: Referring [name]: [name] Treating [name]/Extender: [name] in Treatment: 65 Visit Information History Since Last Visit Added or deleted any medications: No Patient Arrived: [name] Any new allergies or adverse reactions: No Arrival Time: 08:53 Had a fall or experienced change in No Accompanied By: self activities of daily living that may affect Transfer Assistance: None risk of falls: Patient Identification Verified: Yes Signs or symptoms of abuse/neglect since last visito No Secondary Verification Process Completed: Yes Hospitalized since last visit: No Patient Requires Transmission-Based Precautions: No Implantable device outside of the clinic excluding No Patient Has Alerts: No cellular tissue based products placed in the center since last visit: Has Dressing in Place as Prescribed: Yes Pain Present Now: No Electronic Signature(s) Signed: 07/16/2020 3:36:58 PM By: [name] RCP, RRT, CHT Entered By: [name] on 07/16/2020 08:57:18 [name], [name] ([id number]) -------------------------------------------------------------------------------- Clinic Level of Care Assessment Details Patient Name: [name] Date of Service: 07/16/2020 9:00 AM Medical Record Number: [id number] Patient Account Number: [id number] Date of Birth/Sex: Jan 06, 1936 (84 y.o. M) Treating Holt: [name] Primary Care Tashiba Timoney: [name] Other Clinician: Referring [name]: [name] Treating [name]/Extender: [name] in Treatment: 22 Clinic Level of Care Assessment  Items TOOL 1 Quantity Score []  - Use when EandM and Procedure is performed on INITIAL visit 0 ASSESSMENTS - Nursing Assessment / Reassessment []  - General Physical Exam (combine w/ comprehensive assessment (listed just below) when performed on new 0 pt. evals) []  - 0 Comprehensive Assessment (HX, ROS, Risk Assessments, Wounds Hx, etc.) ASSESSMENTS - Wound and Skin Assessment / Reassessment []  - Dermatologic / Skin Assessment (not related to wound area) 0 ASSESSMENTS - Ostomy and/or Continence Assessment and Care []  - Incontinence Assessment and Management 0 []  - 0 Ostomy Care Assessment and Management (repouching, etc.) PROCESS - Coordination of Care []  - Simple Patient / Family Education for ongoing care 0 []  - 0 Complex (extensive) Patient / Family Education for ongoing care []  - 0 Staff obtains Programmer, systems, Records, Test Results / Process Orders []  - 0 Staff telephones HHA, Nursing Homes / Clarify orders / etc []  - 0 Routine Transfer to another Facility (non-emergent condition) []  - 0 Routine Hospital Admission (non-emergent condition) []  - 0 New Admissions / Biomedical engineer / Ordering NPWT, Apligraf, etc. []  - 0 Emergency Hospital Admission (emergent condition) PROCESS - Special Needs []  - Pediatric / Minor Patient Management 0 []  - 0 Isolation Patient Management []  - 0 Hearing / Language / Visual special needs []  - 0 Assessment of Community assistance (transportation, D/C planning, etc.) []  - 0 Additional assistance / Altered mentation []  - 0 Support Surface(s) Assessment (bed, cushion, seat, etc.) INTERVENTIONS - Miscellaneous []  - External ear exam 0 []  - 0 Patient Transfer (multiple staff / Civil Service fast streamer / Similar devices) []  - 0 Simple Staple / Suture removal (25 or less) []  - 0 Complex Staple / Suture removal (26 or more) []  - 0 Hypo/Hyperglycemic Management (do not check if billed separately) []  - 0 Ankle / Brachial Index (ABI) - do not check if  billed separately Has the patient been seen at the hospital within the last three years: Yes Total Score: 0 Level Of Care: ____ [name] ([id number]) Electronic Signature(s)  Signed: 07/16/2020 12:35:51 PM By: [name], [name] Entered By: [name], Marcus Breeding on 07/16/2020 10:14:52 [name] ([id number]) -------------------------------------------------------------------------------- Encounter Discharge Information Details Patient Name: [name] Date of Service: 07/16/2020 9:00 AM Medical Record Number: [id number] Patient Account Number: [id number] Date of Birth/Sex: Aug 14, 1935 (84 y.o. M) Treating Holt: [name] Primary Care Ivelis Norgard: [name] Other Clinician: Referring Nael Petrosyan: [name] Treating Linford Quintela/Extender: [name] in Treatment: 22 Encounter Discharge Information Items Post Procedure Vitals Discharge Condition: Stable Temperature (F): 97.8 Ambulatory Status: Walker Pulse (bpm): 97 Discharge Destination: Home Respiratory Rate (breaths/min): 20 Transportation: Private Auto Blood Pressure (mmHg): 130/75 Accompanied By: self Schedule Follow-up Appointment: Yes Clinical Summary of Care: Electronic Signature(s) Signed: 07/16/2020 12:35:51 PM By: [name], [name] Entered By: [name], Marcus Breeding on 07/16/2020 10:16:19 [name] ([id number]) -------------------------------------------------------------------------------- Lower Extremity Assessment Details Patient Name: [name] Date of Service: 07/16/2020 9:00 AM Medical Record Number: [id number] Patient Account Number: [id number] Date of Birth/Sex: 1936/03/15 (84 y.o. M) Treating Holt: [name] Primary Care Malaiah Viramontes: [name] Other Clinician: Referring Kadence Mikkelson: [name] Treating Iori Gigante/Extender: [name] in Treatment: 22 Edema Assessment Assessed: [Left: No] [name]: No] [Left: Edema] [Right: :] Calf Left:  Right: Point of Measurement: From Medial Instep 42 cm Ankle Left: Right: Point of Measurement: From Medial Instep 26 cm Electronic Signature(s) Signed: 07/16/2020 4:28:47 PM By: Marcus Coria Holt Entered By: [name] on 07/16/2020 09:26:57 [name], [name] ([id number]) -------------------------------------------------------------------------------- Multi Wound Chart Details Patient Name: [name] Date of Service: 07/16/2020 9:00 AM Medical Record Number: [id number] Patient Account Number: [id number] Date of Birth/Sex: 04/13/1936 (84 y.o. M) Treating Holt: [name] Primary Care Shaquil Aldana: [name] Other Clinician: Referring Skylor Hughson: [name] Treating Maquita Sandoval/Extender: [name] in Treatment: 22 Vital Signs Height(in): 72 Pulse(bpm): 97 Weight(lbs): 200 Blood Pressure(mmHg): 130/75 Body Mass Index(BMI): 27 Temperature(F): 97.8 Respiratory Rate(breaths/min): 20 Photos: [N/A:N/A] Wound Location: Left, Plantar Calcaneus N/A N/A Wounding Event: Trauma N/A N/A Primary Etiology: Trauma, Other N/A N/A Secondary Etiology: Pressure Ulcer N/A N/A Comorbid History: Hypertension, Myocardial Infarction, N/A N/A Osteoarthritis, Neuropathy Date Acquired: 10/21/2019 N/A N/A Weeks of Treatment: 22 N/A N/A Wound Status: Open N/A N/A Measurements L x W x D (cm) 0.3x0.4x0.2 N/A N/A Area (cm) : 0.094 N/A N/A Volume (cm) : 0.019 N/A N/A % Reduction in Area: 52.00% N/A N/A % Reduction in Volume: 75.90% N/A N/A Classification: Full Thickness Without Exposed N/A N/A Support Structures Exudate Amount: Medium N/A N/A Exudate Type: Serosanguineous N/A N/A Exudate Color: red, brown N/A N/A Wound Margin: Flat and Intact N/A N/A Granulation Amount: Large (67-100%) N/A N/A Granulation Quality: Pink N/A N/A Necrotic Amount: None Present (0%) N/A N/A Exposed Structures: Fat Layer (Subcutaneous Tissue): N/A N/A Yes Fascia: No Tendon: No Muscle: No Joint:  No Bone: No Epithelialization: None N/A N/A Treatment Notes Electronic Signature(s) Signed: 07/16/2020 12:35:51 PM By: [name], [name] Entered By: [name], Marcus Breeding on 07/16/2020 10:12:02 [name] ([id number]) -------------------------------------------------------------------------------- Multi-Disciplinary Care Plan Details Patient Name: [name] Date of Service: 07/16/2020 9:00 AM Medical Record Number: [id number] Patient Account Number: [id number] Date of Birth/Sex: 10-Aug-1935 (84 y.o. M) Treating Holt: [name] Primary Care Tavia Stave: [name] Other Clinician: Referring Jahne Krukowski: [name] Treating Shannara Winbush/Extender: [name] in Treatment: 22 Active Inactive Pressure Nursing Diagnoses: Potential for impaired tissue integrity related to pressure, friction, moisture, and shear Goals: Patient/caregiver will verbalize understanding of pressure ulcer management Date Initiated: 06/19/2020 Target Resolution Date: 07/10/2020 Goal Status: Active Interventions: Assess offloading mechanisms upon admission and  as needed Notes: Electronic Signature(s) Signed: 07/16/2020 12:35:51 PM By: [name], [name] Entered By: [name], Marcus Breeding on 07/16/2020 10:11:53 [name] ([id number]) -------------------------------------------------------------------------------- Pain Assessment Details Patient Name: [name] Date of Service: 07/16/2020 9:00 AM Medical Record Number: [id number] Patient Account Number: [id number] Date of Birth/Sex: [birth year] (84 y.o. M) Treating Holt: [name] Primary Care Landri Dorsainvil: [name] Other Clinician: Referring Reginaldo Hazard: [name] Treating Nekeya Briski/Extender: [name] in Treatment: 22 Active Problems Location of Pain Severity and Description of Pain Patient Has Paino No Site Locations Pain Management and Medication Current Pain Management: Electronic  Signature(s) Signed: 07/16/2020 4:28:47 PM By: Marcus Coria Holt Entered By: [name] on 07/16/2020 09:24:43 [name] ([id number]) -------------------------------------------------------------------------------- Patient/Caregiver Education Details Patient Name: [name] Date of Service: 07/16/2020 9:00 AM Medical Record Number: [id number] Patient Account Number: [id number] Date of Birth/Gender: Dec 27, 1935 (84 y.o. M) Treating Holt: [name] Primary Care Physician: [name] Other Clinician: Referring Physician: Nobie Holt Treating Physician/Extender: [name] in Treatment: 22 Education Assessment Education Provided To: Patient Education Topics Provided Wound/Skin Impairment: Methods: Explain/Verbal Responses: State content correctly [employer]) Signed: 07/16/2020 12:35:51 PM By: [name], [name] Entered By: [name], Marcus Breeding on 07/16/2020 10:15:10 [name] ([id number]) -------------------------------------------------------------------------------- Wound Assessment Details Patient Name: [name] Date of Service: 07/16/2020 9:00 AM Medical Record Number: [id number] Patient Account Number: [id number] Date of Birth/Sex: 1935/12/05 (84 y.o. M) Treating Holt: [name] Primary Care Santos Hardwick: [name] Other Clinician: Referring Indigo Chaddock: [name] Treating Deagen Krass/Extender: [name] in Treatment: 22 Wound Status Wound Number: 1 Primary Etiology: Trauma, Other Wound Location: Left, Plantar Calcaneus Secondary Pressure Ulcer Etiology: Wounding Event: Trauma Wound Status: Open Date Acquired: 10/21/2019 Comorbid Hypertension, Myocardial Infarction, Osteoarthritis, Weeks Of Treatment: 22 History: Neuropathy Clustered Wound: No Photos Wound Measurements Length: (cm) 0.3 Width: (cm) 0.4 Depth: (cm) 0.2 Area: (cm) 0.094 Volume: (cm) 0.019 % Reduction in Area: 52% % Reduction  in Volume: 75.9% Epithelialization: None Tunneling: No Undermining: No Wound Description Classification: Full Thickness Without Exposed Support Structures Wound Margin: Flat and Intact Exudate Amount: Medium Exudate Type: Serosanguineous Exudate Color: red, brown Foul Odor After Cleansing: No Slough/Fibrino Yes Wound Bed Granulation Amount: Large (67-100%) Exposed Structure Granulation Quality: Pink Fascia Exposed: No Necrotic Amount: None Present (0%) Fat Layer (Subcutaneous Tissue) Exposed: Yes Tendon Exposed: No Muscle Exposed: No Joint Exposed: No Bone Exposed: No Treatment Notes Wound #1 (Calcaneus) Wound Laterality: Plantar, Left Cleanser Normal Saline Discharge Instruction: Wash your hands with soap and water. Remove old dressing, discard into plastic bag and place into trash. Cleanse the wound with Normal Saline prior to applying a clean dressing using gauze sponges, not tissues or cotton balls. Do not scrub or use excessive force. Pat dry using gauze sponges, not tissue or cotton balls. [name], [name] ([id number]) Peri-Wound Care Moisturizing Lotion Discharge Instruction: Suggestions: Theraderm, Eucerin, Cetaphil, or patient preference. Topical Primary Dressing Silvercel Small 2x2 (in/in) Discharge Instruction: Apply Silvercel Small 2x2 (in/in) as instructed Secondary Dressing Foam Dressing, 4x4 (in/in) Secured With 80M Medipore H Soft Cloth Surgical Tape, 2x2 (in/yd) Compression Wrap Compression Stockings Add-Ons Electronic Signature(s) Signed: 07/16/2020 4:28:47 PM By: Marcus Coria Holt Entered By: [name] on 07/16/2020 09:26:19 [name] ([id number]) -------------------------------------------------------------------------------- [place] Details Patient Name: [name] Date of Service: 07/16/2020 9:00 AM Medical Record Number: [id number] Patient Account Number: [id number] Date of Birth/Sex: 1935/12/18 (84 y.o. M) Treating Holt: [name] Primary Care  Nehemiah Mcfarren: [name] Other Clinician: Referring Rube Sanchez: [name] Treating Charonda Hefter/Extender: [name]  in Treatment: 22 Vital Signs Time Taken: 08:55 Temperature (F): 97.8 Height (in): 72 Pulse (bpm): 97 Weight (lbs): 200 Respiratory Rate (breaths/min): 20 Body Mass Index (BMI): 27.1 Blood Pressure (mmHg): 130/75 Reference Range: 80 - 120 mg / dl Electronic Signature(s) Signed: 07/16/2020 3:36:58 PM By: [name] RCP, RRT, CHT Entered By: [name] on 07/16/2020 08:57:43

## 2020-07-16 NOTE — Progress Notes (Addendum)
[name], [name] ([id number]) Visit Report for 07/16/2020 Chief Complaint Document Details Patient Name: [name], [name] Date of Service: 07/16/2020 9:00 AM Medical Record Number: [id number] Patient Account Number: [id number] Date of Birth/Sex: August 22, 1935 (85 y.o. M) Treating RN: Dolan Amen Primary Care Provider: [name] Other Clinician: Referring Provider: [name] Treating Provider/Extender: Skipper Cliche in Treatment: 22 Information Obtained from: Patient Chief Complaint Pressure ulcer left heel Electronic Signature(s) Signed: 07/16/2020 9:05:30 AM By: Worthy Keeler PA-C Entered By: Worthy Keeler on 07/16/2020 09:05:29 [name], [name] ([id number]) -------------------------------------------------------------------------------- Debridement Details Patient Name: [name] Date of Service: 07/16/2020 9:00 AM Medical Record Number: [id number] Patient Account Number: [id number] Date of Birth/Sex: [birth year] (84 y.o. M) Treating RN: Dolan Amen Primary Care Provider: [name] Other Clinician: Referring Provider: [name] Treating Provider/Extender: Skipper Cliche in Treatment: 22 Debridement Performed for Wound #1 Left,Plantar Calcaneus Assessment: Performed By: Physician Tommie Sams., PA-C Debridement Type: Debridement Level of Consciousness (Pre- Awake and Alert procedure): Pre-procedure Verification/Time Out Yes - 10:10 Taken: Start Time: 10:10 Pain Control: Lidocaine 4% Topical Solution Total Area Debrided (L x W): 0.4 (cm) x 0.5 (cm) = 0.2 (cm) Tissue and other material Viable, Non-Viable, Callus, Slough, Subcutaneous, Slough debrided: Level: Skin/Subcutaneous Tissue Debridement Description: Excisional Instrument: Curette Bleeding: Minimum Hemostasis Achieved: Pressure End Time: 10:12 Response to Treatment: Procedure was tolerated well Level of Consciousness (Post- Awake and Alert procedure): Post Debridement  Measurements of Total Wound Length: (cm) 0.4 Width: (cm) 0.5 Depth: (cm) 0.2 Volume: (cm) 0.031 Character of Wound/Ulcer Post Debridement: Stable Post Procedure Diagnosis Same as Pre-procedure Electronic Signature(s) Signed: 07/16/2020 12:35:51 PM By: Charlett Nose RN Signed: 07/16/2020 6:00:33 PM By: Worthy Keeler PA-C Entered By: Georges Mouse, Minus Breeding on 07/16/2020 10:15:35 [name] ([id number]) -------------------------------------------------------------------------------- HPI Details Patient Name: [name] Date of Service: 07/16/2020 9:00 AM Medical Record Number: [id number] Patient Account Number: [id number] Date of Birth/Sex: [birth year] (84 y.o. M) Treating RN: Dolan Amen Primary Care Provider: [name] Other Clinician: Referring Provider: [name] Treating Provider/Extender: Skipper Cliche in Treatment: 22 History of Present Illness HPI Description: 02/11/2020 on evaluation today patient appears to be doing somewhat poorly on initial evaluation concerning his heel. He has been using a topical antibiotic ointment which does not seem to really have been doing the best job for him. He tells me that he had a wound that occurred as a result of his padding in the bottom of his shoe wear his foot drop brace goes actually had worn through and his foot was actually rubbing on the bottom of the shoe on the brace itself. Subsequently this ended up with a wound and he in turn had a lot of issues following. Fortunately there is no signs of active infection at this time which is good news in my opinion. With that being said the wound does appear to be somewhat moist with some evidence of maceration unfortunately. He has no pain he does have neuropathy. He also has bilateral foot drop which she wears braces. This does appear to be however a pressure injury secondary to the AFO brace. The patient has been seen by podiatry since May 2021 unfortunately  this is not healing hence the reason that he is coming to see [place] at this point to see if there is anything we can do to help in this regard. 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 9/15; the  patient missed his appointment last week due to illness with his wife who had to be hospitalized. We are using Hydrofera Blue to the small punched out area in the middle of the left plantar heel. 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 This is felt to be pressure from the brace itself. 03/05/2020 upon evaluation today patient appears to be doing well in regard to his wound on the heel. He has been tolerating the dressing changes without complication which is great news. Fortunately there is no signs of active infection which I am very happy with. 03/19/2020 upon evaluation today patient appears to be doing a little worse in regard to his heel based on what I am seeing at this point. There actually appears to be a pocket off to the side laterally that is problematic for him at this point. I am not to clear this way with some sharp debridement currently. The patient was in agreement with that plan. 10/20; patient I do not usually see. He has a small wound on the left plantar heel. Some depth. He is apparently completing antibiotics. He has bilateral foot drop and therefore is AFO braces limiting what we can do to offload this. 04/14/2020 upon evaluation today patient's heel actually appears to be doing fairly well. There is no signs of active infection at this time which is great news and overall I feel like he is managing nicely. The only issue he has is that the individual who has been helping change the dressings at home unfortunately has not been available for the past week or so. She is out of town. Nonetheless he has been going to urgent care to have the dressings changed as he is not able to  do this on his own and states that his wife is unable to do it for him. 11/10; patient in today for a nurse visit but I was asked to look at him. I note that we are treating him for a small punched-out area on the left plantar heel. He arrived with a new superficial area close to this wound I am not really clear of the exact etiology of this. The bigger problem was increased swelling warmth of the lower leg below the knee but intense erythema involving the entire anterior knee and medial thigh. His entire leg is warm and swollen. However it was not completely obvious to me that there was a big change in the lower leg however the nurses and the patient certainly thought there was. The patient stated that after his 2-week of trimethoprim sulfamethoxazole his legs looked very similar. His vital signs are stable he is afebrile but nevertheless if this is all infection this is extensive and failing outpatient management 04/28/2020 on evaluation today patient presents for follow-up after having been seen in the ER and admitted to the hospital due to cellulitis in the left knee. He has continued to have issues at this point with regard to his knee it still very hot to touch upon evaluation today. His heel ulcer is actually measuring smaller than the last time I saw him but he still is having a lot of difficulty with dry and flaky skin over his feet in general. 05/05/2020 on evaluation today patient appears to be doing pretty well in regard to his heel ulcer. This is measuring a little smaller which is good news. Fortunately there is no signs of active infection at this time. No fevers, chills, nausea, vomiting, or diarrhea. He does have a  new area on his right dorsal surface of his foot. With that being said this region we really do not know exactly what happened where this came from it appears to be somewhat abnormal in that there is a very well demarcated line that almost looks as if he could have had a burn  or something of the sort that was partially protected 1 area but got burned over a different region. Either way I think we can probably get this healed fairly quickly. 05/14/2020 on evaluation today patient appears to be doing about the same in regard to his heel ulcer. We may need to try something different to see if this will be beneficial for him. I am thinking of switching him to collagen. 05/21/2020 on evaluation today patient appears to be doing excellent in regard to his foot ulcer from the standpoint of the overall appearance with that being said were just having some trouble getting it to granulate in. I do believe he would benefit from potentially utilizing a skin substitute though at this point in time at the end of the year I think it may be best to hold off till next year if we need to do this but I am going to switch things out a little bit regular try endoform to see if this will be beneficial for him. 05/28/2020 upon evaluation today patient's wound actually appears to be doing fairly well is very healthy and I am pleased with the overall appearance in that regard. With that being said I do not see any signs of active infection at this time which is great news. No fevers, chills, nausea, vomiting, or diarrhea. Unfortunately the adhesive is sticking to his skin around the wound and causing some minor skin tearing. We need to avoid this if at all possible. 06/04/2020 upon evaluation today patient appears to be doing really about the same in regard to his wound. With that being said there was some callus around the edges of the wound that I did decide to go ahead and tried to clean away and the patient actually tolerated me doing that today. I described what was found in the physical exam/assessment portion of this note 06/20/2019 upon evaluation today patient's heel ulcer actually showed signs of having some callus buildup around the edges of his wound though the wound itself appears to be  doing a little bit better in my opinion. There is no signs of active infection at this time. Overall the size of the wound is not significantly improved but again the quality does appear to be better. [name], [name] ([id number]) 07/02/2020 upon evaluation today patient's wound actually is showing signs of improvement which is great news. I do feel like it is doing quite well. No fevers, chills, nausea, vomiting, or diarrhea. 07/09/2020 upon evaluation today patient's wound on his heel actually appears to be doing quite well which is great news. There does not appear to be any signs of infection he does have a little bit of callus buildup again he we are limited in what we can do for the patient simply due to the fact that he unfortunately has to wear the braces for foot drop and this prevents appropriate and significant offloading. If we were able to do that I think this wound would already be healed nonetheless we are making some slight progress today. 07/16/2020 upon evaluation today patient appears to be doing about the same in regard to his heel ulcer unfortunately. There does not appear to be any  signs of active infection at this time. No fevers, chills, nausea, vomiting, or diarrhea. With that being said I am concerned about the fact that not really making a lot of progress here the wound appears to be somewhat wet I do believe that he would benefit from utilization of a silver alginate dressing as opposed to the collagen to try to see if we can get this under control. There does not appear to be any evidence of active infection at this time. No fevers, chills, nausea, vomiting, or diarrhea. Electronic Signature(s) Signed: 07/16/2020 10:52:34 AM By: Worthy Keeler PA-C Entered By: Worthy Keeler on 07/16/2020 10:52:33 Man[place], [name] ([id number]) -------------------------------------------------------------------------------- Physical Exam Details Patient Name: [name] Date of Service: 07/16/2020 9:00  AM Medical Record Number: [id number] Patient Account Number: [id number] Date of Birth/Sex: 1935/08/04 (84 y.o. M) Treating RN: Dolan Amen Primary Care Provider: [name] Other Clinician: Referring Provider: [name] Treating Provider/Extender: Skipper Cliche in Treatment: 3 Constitutional Well-nourished and well-hydrated in no acute distress. Respiratory normal breathing without difficulty. Psychiatric this patient is able to make decisions and demonstrates good insight into disease process. Alert and Oriented x 3. pleasant and cooperative. Notes Upon inspection patient's wound bed actually showed signs of good granulation and epithelization at this point. There does not appear to be any evidence of active infection which is great news. With that being said I did actually performed her chart debridement to remove callus and some slough from the surface of the wound. We are going to switch to an alginate which I am hopeful will help keep this little bit more dry hopefully allowing it to heal more effectively. Electronic Signature(s) Signed: 07/16/2020 10:52:56 AM By: Worthy Keeler PA-C Entered By: Worthy Keeler on 07/16/2020 10:52:56 Willcox, [name] ([id number]) -------------------------------------------------------------------------------- Physician Orders Details Patient Name: [name] Date of Service: 07/16/2020 9:00 AM Medical Record Number: [id number] Patient Account Number: [id number] Date of Birth/Sex: 20-Jan-1936 (84 y.o. 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 o Nurse Visit as needed Bathing/ Shower/ Hygiene o May shower; gently cleanse wound with antibacterial soap, rinse and pat dry prior to dressing wounds Edema Control - Lymphedema / Segmental Compressive Device / Other Wound #1 Left,Plantar Calcaneus o Elevate, Exercise Daily and Avoid Standing for Long Periods of Time. Wound Treatment Wound #1 - Calcaneus Wound Laterality: Plantar, Left Cleanser: Normal Saline (Generic) 3 x Per Week/30 Days Discharge Instructions: Wash your hands with soap and water. Remove old dressing, discard into plastic bag and place into trash. Cleanse the wound with Normal Saline prior to applying a clean dressing using gauze sponges, not tissues or cotton balls. Do not scrub or use excessive force. Pat dry using gauze sponges, not tissue or cotton balls. Peri-Wound Care: Moisturizing Lotion (Generic) 3 x Per Week/30 Days Discharge Instructions: Suggestions: Theraderm, Eucerin, Cetaphil, or patient preference. Primary Dressing: Silvercel Small 2x2 (in/in) (Generic) 3 x Per Week/30 Days Discharge Instructions: Apply Silvercel Small 2x2 (in/in) as instructed Secondary Dressing: Foam Dressing, 4x4 (in/in) (Generic) 3 x Per Week/30 Days Secured  With: 36M Medipore H Soft Cloth Surgical Tape, 2x2 (in/yd) (Generic) 3 x Per Week/30 Days Electronic Signature(s) Signed: 07/16/2020 12:35:51 PM By: Georges Mouse, Minus Breeding RN Signed: 07/16/2020 6:00:33 PM By: Worthy Keeler PA-C Entered By: Georges Mouse, Minus Breeding on 07/16/2020 10:14:46 [name] ([id number]) -------------------------------------------------------------------------------- Problem List Details Patient Name: [name] Date of Service: 07/16/2020 9:00 AM Medical Record Number: [id number] Patient Account Number: [id number] Date of  Birth/Sex: 1935-09-06 (84 y.o. M) Treating RN: Dolan Amen Primary Care Provider: [name] Other Clinician: Referring Provider: [name] Treating Provider/Extender: Skipper Cliche in Treatment: 22 Active Problems ICD-10 Encounter Code Description Active Date MDM Diagnosis [phone] Pressure ulcer of left heel, stage 3 02/11/2020 No Yes L97.512 Non-pressure chronic ulcer of other part of right foot with fat layer 05/05/2020 No Yes exposed G60.3 Idiopathic progressive neuropathy 02/11/2020 No Yes M21.372 Foot drop, left foot 02/11/2020 No Yes M21.371 Foot drop, right foot 02/11/2020 No Yes L03.116 Cellulitis of left lower limb 04/22/2020 No Yes Inactive Problems Resolved Problems Electronic Signature(s) Signed: 07/16/2020 9:05:22 AM By: Worthy Keeler PA-C Entered By: Worthy Keeler on 07/16/2020 09:05:22 Portillo, [name] ([id number]) -------------------------------------------------------------------------------- Progress Note Details Patient Name: [name] Date of Service: 07/16/2020 9:00 AM Medical Record Number: [id number] Patient Account Number: [id number] Date of Birth/Sex: Feb 11, 1936 (84 y.o. M) Treating RN: Dolan Amen Primary Care Provider: [name] Other Clinician: Referring Provider: [name] Treating Provider/Extender: Skipper Cliche in Treatment: 22 Subjective Chief Complaint Information obtained from Patient Pressure ulcer left heel History of Present Illness (HPI) 02/11/2020 on evaluation today patient appears to be doing somewhat poorly on initial evaluation concerning his heel. He has been using a topical antibiotic ointment which does not seem to really have been doing the best job for him. He tells me that he had a wound that occurred as a result of his padding in the bottom of his shoe wear his foot drop brace goes actually had worn through and his foot was actually rubbing on the bottom of the shoe on the  brace itself. Subsequently this ended up with a wound and he in turn had a lot of issues following. Fortunately there is no signs of active infection at this time which is good news in my opinion. With that being said the wound does appear to be somewhat moist with some evidence of maceration unfortunately. He has no pain he does have neuropathy. He also has bilateral foot drop which she wears braces. This does appear to be however a pressure injury secondary to the AFO brace. The patient has been seen by podiatry since May 2021 unfortunately this is not healing hence the reason that he is coming to see [place] at this point to see if there is anything we can do to help in this regard. 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 9/15; the patient missed his appointment last week due to illness with his wife who had to be hospitalized. We are using Hydrofera Blue to the small punched out area in the middle of the left plantar heel. 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 This is felt to be pressure from the brace itself. 03/05/2020 upon evaluation today patient appears to be doing well in regard to his wound on the heel. He has been tolerating the dressing changes without complication which is great news. Fortunately there is no signs of active infection  which I am very happy with. 03/19/2020 upon evaluation today patient appears to be doing a little worse in regard to his heel based on what I am seeing at this point. There actually appears to be a pocket off to the side laterally that is problematic for him at this point. I am not to clear this way with some sharp debridement currently. The patient was in agreement with that plan. 10/20; patient I do not usually see. He has a small wound on the left plantar heel. Some depth. He is apparently completing antibiotics. He has bilateral foot drop  and therefore is AFO braces limiting what we can do to offload this. 04/14/2020 upon evaluation today patient's heel actually appears to be doing fairly well. There is no signs of active infection at this time which is great news and overall I feel like he is managing nicely. The only issue he has is that the individual who has been helping change the dressings at home unfortunately has not been available for the past week or so. She is out of town. Nonetheless he has been going to urgent care to have the dressings changed as he is not able to do this on his own and states that his wife is unable to do it for him. 11/10; patient in today for a nurse visit but I was asked to look at him. I note that we are treating him for a small punched-out area on the left plantar heel. He arrived with a new superficial area close to this wound I am not really clear of the exact etiology of this. The bigger problem was increased swelling warmth of the lower leg below the knee but intense erythema involving the entire anterior knee and medial thigh. His entire leg is warm and swollen. However it was not completely obvious to me that there was a big change in the lower leg however the nurses and the patient certainly thought there was. The patient stated that after his 2-week of trimethoprim sulfamethoxazole his legs looked very similar. His vital signs are stable he is afebrile but nevertheless if this is all infection this is extensive and failing outpatient management 04/28/2020 on evaluation today patient presents for follow-up after having been seen in the ER and admitted to the hospital due to cellulitis in the left knee. He has continued to have issues at this point with regard to his knee it still very hot to touch upon evaluation today. His heel ulcer is actually measuring smaller than the last time I saw him but he still is having a lot of difficulty with dry and flaky skin over his feet in general. 05/05/2020  on evaluation today patient appears to be doing pretty well in regard to his heel ulcer. This is measuring a little smaller which is good news. Fortunately there is no signs of active infection at this time. No fevers, chills, nausea, vomiting, or diarrhea. He does have a new area on his right dorsal surface of his foot. With that being said this region we really do not know exactly what happened where this came from it appears to be somewhat abnormal in that there is a very well demarcated line that almost looks as if he could have had a burn or something of the sort that was partially protected 1 area but got burned over a different region. Either way I think we can probably get this healed fairly quickly. 05/14/2020 on evaluation today patient appears to be doing about  the same in regard to his heel ulcer. We may need to try something different to see if this will be beneficial for him. I am thinking of switching him to collagen. 05/21/2020 on evaluation today patient appears to be doing excellent in regard to his foot ulcer from the standpoint of the overall appearance with that being said were just having some trouble getting it to granulate in. I do believe he would benefit from potentially utilizing a skin substitute though at this point in time at the end of the year I think it may be best to hold off till next year if we need to do this but I am going to switch things out a little bit regular try endoform to see if this will be beneficial for him. 05/28/2020 upon evaluation today patient's wound actually appears to be doing fairly well is very healthy and I am pleased with the overall appearance in that regard. With that being said I do not see any signs of active infection at this time which is great news. No fevers, chills, nausea, vomiting, or diarrhea. Unfortunately the adhesive is sticking to his skin around the wound and causing some minor skin tearing. We need to avoid this if at all  possible. 06/04/2020 upon evaluation today patient appears to be doing really about the same in regard to his wound. With that being said there was some callus around the edges of the wound that I did decide to go ahead and tried to clean away and the patient actually tolerated me doing that today. I described what was found in the physical exam/assessment portion of this note [name], [name] ([id number]) 06/20/2019 upon evaluation today patient's heel ulcer actually showed signs of having some callus buildup around the edges of his wound though the wound itself appears to be doing a little bit better in my opinion. There is no signs of active infection at this time. Overall the size of the wound is not significantly improved but again the quality does appear to be better. 07/02/2020 upon evaluation today patient's wound actually is showing signs of improvement which is great news. I do feel like it is doing quite well. No fevers, chills, nausea, vomiting, or diarrhea. 07/09/2020 upon evaluation today patient's wound on his heel actually appears to be doing quite well which is great news. There does not appear to be any signs of infection he does have a little bit of callus buildup again he we are limited in what we can do for the patient simply due to the fact that he unfortunately has to wear the braces for foot drop and this prevents appropriate and significant offloading. If we were able to do that I think this wound would already be healed nonetheless we are making some slight progress today. 07/16/2020 upon evaluation today patient appears to be doing about the same in regard to his heel ulcer unfortunately. There does not appear to be any signs of active infection at this time. No fevers, chills, nausea, vomiting, or diarrhea. With that being said I am concerned about the fact that not really making a lot of progress here the wound appears to be somewhat wet I do believe that he would benefit from  utilization of a silver alginate dressing as opposed to the collagen to try to see if we can get this under control. There does not appear to be any evidence of active infection at this time. No fevers, chills, nausea, vomiting, or diarrhea. Objective Constitutional Well-nourished  and well-hydrated in no acute distress. Vitals Time Taken: 8:55 AM, Height: 72 in, Weight: 200 lbs, BMI: 27.1, Temperature: 97.8 F, Pulse: 97 bpm, Respiratory Rate: 20 breaths/min, Blood Pressure: 130/75 mmHg. Respiratory normal breathing without difficulty. Psychiatric this patient is able to make decisions and demonstrates good insight into disease process. Alert and Oriented x 3. pleasant and cooperative. General Notes: Upon inspection patient's wound bed actually showed signs of good granulation and epithelization at this point. There does not appear to be any evidence of active infection which is great news. With that being said I did actually performed her chart debridement to remove callus and some slough from the surface of the wound. We are going to switch to an alginate which I am hopeful will help keep this little bit more dry hopefully allowing it to heal more effectively. Integumentary (Hair, Skin) Wound #1 status is Open. Original cause of wound was Trauma. The wound is located on the Left,Plantar Calcaneus. The wound measures 0.3cm length x 0.4cm width x 0.2cm depth; 0.094cm^2 area and 0.019cm^3 volume. There is Fat Layer (Subcutaneous Tissue) exposed. There is no tunneling or undermining noted. There is a medium amount of serosanguineous drainage noted. The wound margin is flat and intact. There is large (67-100%) pink granulation within the wound bed. There is no necrotic tissue within the wound bed. 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 There was a Excisional Skin/Subcutaneous Tissue Debridement with a total area of 0.2 sq cm performed by Tommie Sams., PA-C. With the following instrument(s): Curette to remove Viable and Non-Viable tissue/material. Material removed includes Callus, Subcutaneous Tissue, and Slough after achieving pain control using Lidocaine 4% Topical Solution. A time out was conducted at 10:10, prior to the start of the procedure. A Minimum amount of bleeding was controlled with Pressure. The procedure was tolerated well. Post Debridement Measurements: 0.4cm length x 0.5cm width x 0.2cm depth; 0.031cm^3 volume. Character of Wound/Ulcer Post Debridement is stable. Post procedure Diagnosis Wound #1: Same as Pre-Procedure Plan Follow-up Appointments: Return Appointment in 1 week. Nurse Visit as needed Bathing/ Shower/ Hygiene: May shower; gently cleanse wound with antibacterial soap, rinse and pat dry prior to dressing wounds Edema Control - Lymphedema / Segmental Compressive Device / Other: Wound #1 Left,Plantar Calcaneus: Elevate, Exercise Daily and Avoid Standing for Long Periods of Time. WOUND #1: - Calcaneus Wound Laterality: Plantar, Left Cleanser: Normal Saline (Generic) 3 x Per Week/30 Days Discharge Instructions: Wash your hands with soap and water. Remove old dressing, discard into plastic bag and place into trash. Cleanse the wound with Normal Saline prior to applying a clean dressing using gauze sponges, not tissues or cotton balls. Do not scrub or use excessive force. Pat dry using gauze sponges, not tissue or cotton balls. Peri-Wound Care: Moisturizing Lotion (Generic) 3 x Per Week/30 Days Discharge Instructions: Suggestions: Theraderm, Eucerin, Cetaphil, or patient preference. Primary  Dressing: Silvercel Small 2x2 (in/in) (Generic) 3 x Per Week/30 Days Discharge Instructions: Apply Silvercel Small 2x2 (in/in) as instructed Secondary Dressing: Foam Dressing, 4x4 (in/in) (Generic) 3 x Per Week/30 Days Secured With: 60M Medipore H Soft Cloth Surgical Tape, 2x2 (in/yd) (Generic) 3 x Per Week/30 Days 1. Would  recommend currently that we continue with the wound care measures as before the patient is in agreement with that plan this includes the use of a silver alginate dressing followed by a foam dressing over top secured with tape. 2. He is still going to try to continue to walk as little as possible. Obviously offloading is still a major issue as he does have to wear his braces for foot drop which does slow things down from a healing perspective. We will see patient back for reevaluation in 1 week here in the clinic. If anything worsens or changes patient will contact our office for additional recommendations. Electronic Signature(s) Signed: 07/16/2020 10:53:25 AM By: Worthy Keeler PA-C Entered By: Worthy Keeler on 07/16/2020 10:53:25 [name], [name] ([id number]) -------------------------------------------------------------------------------- SuperBill Details Patient Name: [name] Date of Service: 07/16/2020 Medical Record Number: [id number] Patient Account Number: [id number] Date of Birth/Sex: Oct 04, 1935 (84 y.o. M) Treating RN: Dolan Amen Primary Care Provider: [name] Other Clinician: Referring Provider: [name] Treating Provider/Extender: Skipper Cliche in Treatment: 22 Diagnosis Coding ICD-10 Codes Code Description [phone] Pressure ulcer of left heel, stage 3 L97.512 Non-pressure chronic ulcer of other part of right foot with fat layer exposed G60.3 Idiopathic progressive neuropathy M21.372 Foot drop, left foot M21.371 Foot drop, right foot L03.116 Cellulitis of left lower limb Facility Procedures CPT4 Code:  [id number] Description: [id number] - DEB SUBQ TISSUE 20 SQ CM/< Modifier: Quantity: 1 CPT4 Code: Description: ICD-10 Diagnosis Description L89.623 Pressure ulcer of left heel, stage 3 Modifier: Quantity: Physician Procedures CPT4 Code: [id number] Description: 11042 - WC PHYS SUBQ TISS 20 SQ CM Modifier: Quantity: 1 CPT4 Code: Description: ICD-10 Diagnosis Description L89.623 Pressure ulcer of left heel, stage 3 Modifier: Quantity: Electronic Signature(s) Signed: 07/16/2020 10:53:39 AM By: Worthy Keeler PA-C Entered By: Worthy Keeler on 07/16/2020 10:53:39

## 2020-07-23 ENCOUNTER — Other Ambulatory Visit: Payer: Self-pay

## 2020-07-23 ENCOUNTER — Encounter: Payer: Medicare Other | Admitting: Physician Assistant

## 2020-07-23 DIAGNOSIS — L89623 Pressure ulcer of left heel, stage 3: Secondary | ICD-10-CM | POA: Diagnosis not present

## 2020-07-23 NOTE — Progress Notes (Addendum)
TLALOC, TADDEI ([id number]) Visit Report for 07/23/2020 Chief Complaint Document Details Patient Name: [name], [name] Date of Service: 07/23/2020 9:00 AM Medical Record Number: [id number] Patient Account Number: [id number] Date of Birth/Sex: 07/22/1935 (85 y.o. M) Treating RN: Dolan Amen Primary Care Provider: [name] Other Clinician: Referring Provider: [name] Treating Provider/Extender: Skipper Cliche in Treatment: 23 Information Obtained from: Patient Chief Complaint Pressure ulcer left heel Electronic Signature(s) Signed: 07/23/2020 9:41:19 AM By: Worthy Keeler PA-C Entered By: Worthy Keeler on 07/23/2020 09:41:19 Szczerba, [name] ([id number]) -------------------------------------------------------------------------------- Debridement Details Patient Name: [name] Date of Service: 07/23/2020 9:00 AM Medical Record Number: [id number] Patient Account Number: [id number] Date of Birth/Sex: January 06, 1936 (85 y.o. M) Treating RN: Dolan Amen Primary Care Provider: [name] Other Clinician: Referring Provider: [name] Treating Provider/Extender: Skipper Cliche in Treatment: 23 Debridement Performed for Wound #1 Left,Plantar Calcaneus Assessment: Performed By: Physician Tommie Sams., PA-C Debridement Type: Debridement Level of Consciousness (Pre- Awake and Alert procedure): Pre-procedure Verification/Time Out Yes - 09:47 Taken: Start Time: 09:47 Total Area Debrided (L x W): 0.3 (cm) x 0.4 (cm) = 0.12 (cm) Tissue and other material Viable, Non-Viable, Callus, Slough, Subcutaneous, Slough debrided: Level: Skin/Subcutaneous Tissue Debridement Description: Excisional Instrument: Curette Bleeding: Minimum Hemostasis Achieved: Pressure End Time: 09:49 Response to Treatment: Procedure was tolerated well Level of Consciousness (Post- Awake and Alert procedure): Post Debridement Measurements of Total Wound Length:  (cm) 0.3 Width: (cm) 0.4 Depth: (cm) 0.3 Volume: (cm) 0.028 Character of Wound/Ulcer Post Debridement: Stable Post Procedure Diagnosis Same as Pre-procedure Electronic Signature(s) Signed: 07/23/2020 10:03:03 AM By: Charlett Nose RN Signed: 07/23/2020 4:44:32 PM By: Worthy Keeler PA-C Entered By: Georges Mouse, Minus Breeding on 07/23/2020 09:49:26 Cuadros, [name] ([id number]) -------------------------------------------------------------------------------- HPI Details Patient Name: [name] Date of Service: 07/23/2020 9:00 AM Medical Record Number: [id number] Patient Account Number: [id number] Date of Birth/Sex: 12-26-1935 (85 y.o. M) Treating RN: Dolan Amen Primary Care Provider: [name] Other Clinician: Referring Provider: [name] Treating Provider/Extender: Skipper Cliche in Treatment: 23 History of Present Illness HPI Description: 02/11/2020 on evaluation today patient appears to be doing somewhat poorly on initial evaluation concerning his heel. He has been using a topical antibiotic ointment which does not seem to really have been doing the best job for him. He tells me that he had a wound that occurred as a result of his padding in the bottom of his shoe wear his foot drop brace goes actually had worn through and his foot was actually rubbing on the bottom of the shoe on the brace itself. Subsequently this ended up with a wound and he in turn had a lot of issues following. Fortunately there is no signs of active infection at this time which is good news in my opinion. With that being said the wound does appear to be somewhat moist with some evidence of maceration unfortunately. He has no pain he does have neuropathy. He also has bilateral foot drop which she wears braces. This does appear to be however a pressure injury secondary to the AFO brace. The patient has been seen by podiatry since May 2021 unfortunately this is not healing hence the  reason that he is coming to see [place] at this point to see if there is anything we can do to help in this regard. 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 9/15; the patient missed his appointment last week  due to illness with his wife who had to be hospitalized. We are using Hydrofera Blue to the small punched out area in the middle of the left plantar heel. 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 This is felt to be pressure from the brace itself. 03/05/2020 upon evaluation today patient appears to be doing well in regard to his wound on the heel. He has been tolerating the dressing changes without complication which is great news. Fortunately there is no signs of active infection which I am very happy with. 03/19/2020 upon evaluation today patient appears to be doing a little worse in regard to his heel based on what I am seeing at this point. There actually appears to be a pocket off to the side laterally that is problematic for him at this point. I am not to clear this way with some sharp debridement currently. The patient was in agreement with that plan. 10/20; patient I do not usually see. He has a small wound on the left plantar heel. Some depth. He is apparently completing antibiotics. He has bilateral foot drop and therefore is AFO braces limiting what we can do to offload this. 04/14/2020 upon evaluation today patient's heel actually appears to be doing fairly well. There is no signs of active infection at this time which is great news and overall I feel like he is managing nicely. The only issue he has is that the individual who has been helping change the dressings at home unfortunately has not been available for the past week or so. She is out of town. Nonetheless he has been going to urgent care to have the dressings changed as he is not able to do this on his own and states  that his wife is unable to do it for him. 11/10; patient in today for a nurse visit but I was asked to look at him. I note that we are treating him for a small punched-out area on the left plantar heel. He arrived with a new superficial area close to this wound I am not really clear of the exact etiology of this. The bigger problem was increased swelling warmth of the lower leg below the knee but intense erythema involving the entire anterior knee and medial thigh. His entire leg is warm and swollen. However it was not completely obvious to me that there was a big change in the lower leg however the nurses and the patient certainly thought there was. The patient stated that after his 2-week of trimethoprim sulfamethoxazole his legs looked very similar. His vital signs are stable he is afebrile but nevertheless if this is all infection this is extensive and failing outpatient management 04/28/2020 on evaluation today patient presents for follow-up after having been seen in the ER and admitted to the hospital due to cellulitis in the left knee. He has continued to have issues at this point with regard to his knee it still very hot to touch upon evaluation today. His heel ulcer is actually measuring smaller than the last time I saw him but he still is having a lot of difficulty with dry and flaky skin over his feet in general. 05/05/2020 on evaluation today patient appears to be doing pretty well in regard to his heel ulcer. This is measuring a little smaller which is good news. Fortunately there is no signs of active infection at this time. No fevers, chills, nausea, vomiting, or diarrhea. He does have a new area on his right dorsal  surface of his foot. With that being said this region we really do not know exactly what happened where this came from it appears to be somewhat abnormal in that there is a very well demarcated line that almost looks as if he could have had a burn or something of the  sort that was partially protected 1 area but got burned over a different region. Either way I think we can probably get this healed fairly quickly. 05/14/2020 on evaluation today patient appears to be doing about the same in regard to his heel ulcer. We may need to try something different to see if this will be beneficial for him. I am thinking of switching him to collagen. 05/21/2020 on evaluation today patient appears to be doing excellent in regard to his foot ulcer from the standpoint of the overall appearance with that being said were just having some trouble getting it to granulate in. I do believe he would benefit from potentially utilizing a skin substitute though at this point in time at the end of the year I think it may be best to hold off till next year if we need to do this but I am going to switch things out a little bit regular try endoform to see if this will be beneficial for him. 05/28/2020 upon evaluation today patient's wound actually appears to be doing fairly well is very healthy and I am pleased with the overall appearance in that regard. With that being said I do not see any signs of active infection at this time which is great news. No fevers, chills, nausea, vomiting, or diarrhea. Unfortunately the adhesive is sticking to his skin around the wound and causing some minor skin tearing. We need to avoid this if at all possible. 06/04/2020 upon evaluation today patient appears to be doing really about the same in regard to his wound. With that being said there was some callus around the edges of the wound that I did decide to go ahead and tried to clean away and the patient actually tolerated me doing that today. I described what was found in the physical exam/assessment portion of this note 06/20/2019 upon evaluation today patient's heel ulcer actually showed signs of having some callus buildup around the edges of his wound though the wound itself appears to be doing a little bit  better in my opinion. There is no signs of active infection at this time. Overall the size of the wound is not significantly improved but again the quality does appear to be better. ARMARION, GREEK ([id number]) 07/02/2020 upon evaluation today patient's wound actually is showing signs of improvement which is great news. I do feel like it is doing quite well. No fevers, chills, nausea, vomiting, or diarrhea. 07/09/2020 upon evaluation today patient's wound on his heel actually appears to be doing quite well which is great news. There does not appear to be any signs of infection he does have a little bit of callus buildup again he we are limited in what we can do for the patient simply due to the fact that he unfortunately has to wear the braces for foot drop and this prevents appropriate and significant offloading. If we were able to do that I think this wound would already be healed nonetheless we are making some slight progress today. 07/16/2020 upon evaluation today patient appears to be doing about the same in regard to his heel ulcer unfortunately. There does not appear to be any signs of active infection at this  time. No fevers, chills, nausea, vomiting, or diarrhea. With that being said I am concerned about the fact that not really making a lot of progress here the wound appears to be somewhat wet I do believe that he would benefit from utilization of a silver alginate dressing as opposed to the collagen to try to see if we can get this under control. There does not appear to be any evidence of active infection at this time. No fevers, chills, nausea, vomiting, or diarrhea. 07/23/2020 upon evaluation today patient actually appears to be doing quite well with regard to his wound currently. There does not appear to be any signs of worsening which is great news. Fortunately there is no evidence of active infection either which is also excellent. No fevers, chills, nausea, vomiting, or diarrhea. Electronic  Signature(s) Signed: 07/23/2020 3:40:05 PM By: Worthy Keeler PA-C Entered By: Worthy Keeler on 07/23/2020 15:40:04 [name] ([id number]) -------------------------------------------------------------------------------- Physical Exam Details Patient Name: [name] Date of Service: 07/23/2020 9:00 AM Medical Record Number: [id number] Patient Account Number: [id number] Date of Birth/Sex: Mar 16, 1936 (84 y.o. M) Treating RN: Dolan Amen Primary Care Provider: [name] Other Clinician: Referring Provider: [name] Treating Provider/Extender: Skipper Cliche in Treatment: 87 Constitutional Well-nourished and well-hydrated in no acute distress. Respiratory normal breathing without difficulty. Psychiatric this patient is able to make decisions and demonstrates good insight into disease process. Alert and Oriented x 3. pleasant and cooperative. Notes Upon inspection patient's wound bed actually showed signs of good granulation with some callus around the exit did require sharp debridement there was minimal slough and biofilm noted. I did perform debridement today to clear this away he tolerated that without complication post debridement wound bed appears to be doing much better which is excellent news. Electronic Signature(s) Signed: 07/23/2020 3:40:25 PM By: Worthy Keeler PA-C Entered By: Worthy Keeler on 07/23/2020 15:40:25 Olivo, [name] ([id number]) -------------------------------------------------------------------------------- Physician Orders Details Patient Name: [name] Date of Service: 07/23/2020 9:00 AM Medical Record Number: [id number] Patient Account Number: [id number] Date of Birth/Sex: [birth year] (84 y.o. 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 o Nurse Visit as needed Bathing/ Shower/ Hygiene o May shower; gently cleanse wound with antibacterial soap, rinse and pat dry prior to dressing wounds Edema Control - Lymphedema / Segmental Compressive Device / Other Wound #1 Left,Plantar Calcaneus o Elevate, Exercise Daily and Avoid Standing for Long Periods of Time. Wound Treatment Wound #1 - Calcaneus Wound Laterality: Plantar, Left Cleanser: Normal Saline (Generic) 3 x Per Week/30 Days Discharge Instructions: Wash your hands with soap and water. Remove old dressing, discard into plastic bag and place into trash. Cleanse the wound with Normal Saline prior to applying a clean dressing using gauze sponges, not tissues or cotton balls. Do not scrub or use excessive force. Pat dry using gauze sponges, not tissue or cotton balls. Peri-Wound Care: Moisturizing Lotion (Generic) 3 x Per Week/30 Days Discharge Instructions: Suggestions: Theraderm, Eucerin, Cetaphil, or patient preference. Primary Dressing: Silvercel Small 2x2 (in/in) (Generic) 3 x Per Week/30 Days Discharge  Instructions: Apply Silvercel Small 2x2 (in/in) as instructed Secondary Dressing: Foam Dressing, 4x4 (in/in) (Generic) 3 x Per Week/30 Days Secured With: 83M Medipore H Soft Cloth Surgical Tape, 2x2 (in/yd) (Generic) 3 x Per Week/30 Days Electronic Signature(s) Signed: 07/23/2020 10:03:03 AM By: Georges Mouse, Minus Breeding RN Signed: 07/23/2020 4:44:32 PM By: Worthy Keeler PA-C Entered By: Georges Mouse, Minus Breeding on 07/23/2020 09:50:28 [name]  ([id number]) -------------------------------------------------------------------------------- Problem List Details Patient Name: [name] Date of Service: 07/23/2020 9:00 AM Medical Record Number: [id number] Patient Account Number: [id number] Date of Birth/Sex: 1935-10-10 (84 y.o. 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 02/11/2020 No Yes M21.372 Foot drop, left foot 02/11/2020 No Yes M21.371 Foot drop, right foot 02/11/2020 No Yes L03.116 Cellulitis of left lower limb 04/22/2020 No Yes Inactive Problems Resolved Problems Electronic Signature(s) Signed: 07/23/2020 9:41:06 AM By: Worthy Keeler PA-C Entered By: Worthy Keeler on 07/23/2020 09:41:05 Rodin, [name] ([id number]) -------------------------------------------------------------------------------- Progress Note Details Patient Name: [name] Date of Service: 07/23/2020 9:00 AM Medical Record Number: [id number] Patient Account Number: [id number] Date of Birth/Sex: 1935-12-03 (84 y.o. M) Treating RN: Dolan Amen Primary Care Provider: [name] Other Clinician: Referring Provider: [name] Treating Provider/Extender: Skipper Cliche in Treatment: 23 Subjective Chief Complaint Information obtained from Patient Pressure ulcer left heel History of Present Illness (HPI) 02/11/2020 on evaluation today patient appears to be doing somewhat poorly on initial evaluation concerning his heel. He has been using a topical antibiotic ointment which does not seem to really  have been doing the best job for him. He tells me that he had a wound that occurred as a result of his padding in the bottom of his shoe wear his foot drop brace goes actually had worn through and his foot was actually rubbing on the bottom of the shoe on the brace itself. Subsequently this ended up with a wound and he in turn had a lot of issues following. Fortunately there is no signs of active infection at this time which is good news in my opinion. With that being said the wound does appear to be somewhat moist with some evidence of maceration unfortunately. He has no pain he does have neuropathy. He also has bilateral foot drop which she wears braces. This does appear to be however a pressure injury secondary to the AFO brace. The patient has been seen by podiatry since May 2021 unfortunately this is not healing hence the reason that he is coming to see [place] at this point to see if there is anything we can do to help in this regard. 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 9/15; the patient missed his appointment last week due to illness with his wife who had to be hospitalized. We are using Hydrofera Blue to the small punched out area in the middle of the left plantar heel. 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 This is felt to be pressure from the brace itself. 03/05/2020 upon evaluation today patient appears to be doing well in regard to his wound on the heel.  He has been tolerating the dressing changes without complication which is great news. Fortunately there is no signs of active infection which I am very happy with. 03/19/2020 upon evaluation today patient appears to be doing a little worse in regard to his heel based on what I am seeing at this point. There actually appears to be a pocket off to the side laterally that is problematic for him at this point. I am not to  clear this way with some sharp debridement currently. The patient was in agreement with that plan. 10/20; patient I do not usually see. He has a small wound on the left plantar heel. Some depth. He is apparently completing antibiotics. He has bilateral foot drop and therefore is AFO braces limiting what we can do to offload this. 04/14/2020 upon evaluation today patient's heel actually appears to be doing fairly well. There is no signs of active infection at this time which is great news and overall I feel like he is managing nicely. The only issue he has is that the individual who has been helping change the dressings at home unfortunately has not been available for the past week or so. She is out of town. Nonetheless he has been going to urgent care to have the dressings changed as he is not able to do this on his own and states that his wife is unable to do it for him. 11/10; patient in today for a nurse visit but I was asked to look at him. I note that we are treating him for a small punched-out area on the left plantar heel. He arrived with a new superficial area close to this wound I am not really clear of the exact etiology of this. The bigger problem was increased swelling warmth of the lower leg below the knee but intense erythema involving the entire anterior knee and medial thigh. His entire leg is warm and swollen. However it was not completely obvious to me that there was a big change in the lower leg however the nurses and the patient certainly thought there was. The patient stated that after his 2-week of trimethoprim sulfamethoxazole his legs looked very similar. His vital signs are stable he is afebrile but nevertheless if this is all infection this is extensive and failing outpatient management 04/28/2020 on evaluation today patient presents for follow-up after having been seen in the ER and admitted to the hospital due to cellulitis in the left knee. He has continued to have issues  at this point with regard to his knee it still very hot to touch upon evaluation today. His heel ulcer is actually measuring smaller than the last time I saw him but he still is having a lot of difficulty with dry and flaky skin over his feet in general. 05/05/2020 on evaluation today patient appears to be doing pretty well in regard to his heel ulcer. This is measuring a little smaller which is good news. Fortunately there is no signs of active infection at this time. No fevers, chills, nausea, vomiting, or diarrhea. He does have a new area on his right dorsal surface of his foot. With that being said this region we really do not know exactly what happened where this came from it appears to be somewhat abnormal in that there is a very well demarcated line that almost looks as if he could have had a burn or something of the sort that was partially protected 1 area but got burned over a different region. Either  way I think we can probably get this healed fairly quickly. 05/14/2020 on evaluation today patient appears to be doing about the same in regard to his heel ulcer. We may need to try something different to see if this will be beneficial for him. I am thinking of switching him to collagen. 05/21/2020 on evaluation today patient appears to be doing excellent in regard to his foot ulcer from the standpoint of the overall appearance with that being said were just having some trouble getting it to granulate in. I do believe he would benefit from potentially utilizing a skin substitute though at this point in time at the end of the year I think it may be best to hold off till next year if we need to do this but I am going to switch things out a little bit regular try endoform to see if this will be beneficial for him. 05/28/2020 upon evaluation today patient's wound actually appears to be doing fairly well is very healthy and I am pleased with the overall appearance in that regard. With that being said I  do not see any signs of active infection at this time which is great news. No fevers, chills, nausea, vomiting, or diarrhea. Unfortunately the adhesive is sticking to his skin around the wound and causing some minor skin tearing. We need to avoid this if at all possible. 06/04/2020 upon evaluation today patient appears to be doing really about the same in regard to his wound. With that being said there was some callus around the edges of the wound that I did decide to go ahead and tried to clean away and the patient actually tolerated me doing that today. I described what was found in the physical exam/assessment portion of this note AMIN, FORNWALT ([id number]) 06/20/2019 upon evaluation today patient's heel ulcer actually showed signs of having some callus buildup around the edges of his wound though the wound itself appears to be doing a little bit better in my opinion. There is no signs of active infection at this time. Overall the size of the wound is not significantly improved but again the quality does appear to be better. 07/02/2020 upon evaluation today patient's wound actually is showing signs of improvement which is great news. I do feel like it is doing quite well. No fevers, chills, nausea, vomiting, or diarrhea. 07/09/2020 upon evaluation today patient's wound on his heel actually appears to be doing quite well which is great news. There does not appear to be any signs of infection he does have a little bit of callus buildup again he we are limited in what we can do for the patient simply due to the fact that he unfortunately has to wear the braces for foot drop and this prevents appropriate and significant offloading. If we were able to do that I think this wound would already be healed nonetheless we are making some slight progress today. 07/16/2020 upon evaluation today patient appears to be doing about the same in regard to his heel ulcer unfortunately. There does not appear to be any signs of  active infection at this time. No fevers, chills, nausea, vomiting, or diarrhea. With that being said I am concerned about the fact that not really making a lot of progress here the wound appears to be somewhat wet I do believe that he would benefit from utilization of a silver alginate dressing as opposed to the collagen to try to see if we can get this under control. There does not  appear to be any evidence of active infection at this time. No fevers, chills, nausea, vomiting, or diarrhea. 07/23/2020 upon evaluation today patient actually appears to be doing quite well with regard to his wound currently. There does not appear to be any signs of worsening which is great news. Fortunately there is no evidence of active infection either which is also excellent. No fevers, chills, nausea, vomiting, or diarrhea. Objective Constitutional Well-nourished and well-hydrated in no acute distress. Vitals Time Taken: 9:19 AM, Height: 72 in, Weight: 200 lbs, BMI: 27.1, Temperature: 98 F, Pulse: 78 bpm, Respiratory Rate: 18 breaths/min, Blood Pressure: 121/79 mmHg. Respiratory normal breathing without difficulty. Psychiatric this patient is able to make decisions and demonstrates good insight into disease process. Alert and Oriented x 3. pleasant and cooperative. General Notes: Upon inspection patient's wound bed actually showed signs of good granulation with some callus around the exit did require sharp debridement there was minimal slough and biofilm noted. I did perform debridement today to clear this away he tolerated that without complication post debridement wound bed appears to be doing much better which is excellent news. Integumentary (Hair, Skin) Wound #1 status is Open. Original cause of wound was Trauma. The wound is located on the Left,Plantar Calcaneus. The wound measures 0.3cm length x 0.4cm width x 0.2cm depth; 0.094cm^2 area and 0.019cm^3 volume. There is Fat Layer (Subcutaneous Tissue)  exposed. There is no tunneling or undermining noted. There is a medium amount of serosanguineous drainage noted. The wound margin is flat and intact. There is large (67-100%) pink granulation within the wound bed. There is no necrotic tissue within the wound bed. 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 There was a Excisional Skin/Subcutaneous Tissue Debridement with a total area of 0.12 sq cm performed by Tommie Sams., PA-C. With the following instrument(s): Curette to remove Viable and Non-Viable tissue/material. Material removed includes Callus, Subcutaneous Tissue, and Slough. A time out was conducted at 09:47, prior to the start of the procedure. A Minimum amount of bleeding was controlled with Pressure. The procedure was tolerated well. Post Debridement Measurements: 0.3cm length x 0.4cm width x 0.3cm depth; 0.028cm^3 volume. Character of Wound/Ulcer Post Debridement is stable. Post procedure Diagnosis Wound #1: Same as Pre-Procedure Plan Follow-up Appointments: Return Appointment in 1 week. Nurse Visit as needed Bathing/ Shower/ Hygiene: May shower; gently cleanse wound with antibacterial soap, rinse and pat dry prior to dressing wounds Edema Control - Lymphedema / Segmental Compressive Device / Other: Wound #1 Left,Plantar Calcaneus: Elevate, Exercise Daily and Avoid Standing for Long Periods of Time. WOUND #1: - Calcaneus Wound Laterality: Plantar, Left Cleanser: Normal Saline (Generic) 3 x Per Week/30 Days Discharge Instructions: Wash your hands with soap and water. Remove old dressing, discard into plastic bag and place  into trash. Cleanse the wound with Normal Saline prior to applying a clean dressing using gauze sponges, not tissues or cotton balls. Do not scrub or use excessive force. Pat dry using gauze sponges, not tissue or cotton balls. Peri-Wound Care: Moisturizing Lotion (Generic) 3 x Per Week/30 Days Discharge Instructions: Suggestions: Theraderm, Eucerin, Cetaphil, or patient preference. Primary Dressing: Silvercel Small 2x2 (in/in) (Generic) 3 x Per Week/30 Days Discharge  Instructions: Apply Silvercel Small 2x2 (in/in) as instructed Secondary Dressing: Foam Dressing, 4x4 (in/in) (Generic) 3 x Per Week/30 Days Secured With: 63M Medipore H Soft Cloth Surgical Tape, 2x2 (in/yd) (Generic) 3 x Per Week/30 Days 1. Would recommend currently that we going continue with the wound care measures as before. This includes the use of silver cell to the wound bed to try to help control some of the drainage. 2. I am going to recommend currently that we going continue with the foam dressing to cover secured with tape that seems to be doing well for the patient. 3. I am also going to recommend that the patient continue to keep pressure off of his heel is much as possible I know he has to wear his braces when he is moving around but when he is not moving around try to take it easy as he can he is definitely a good way to go. We will see patient back for reevaluation in 1 week here in the clinic. If anything worsens or changes patient will contact our office for additional recommendations. Electronic Signature(s) Signed: 07/23/2020 3:42:34 PM By: Worthy Keeler PA-C Entered By: Worthy Keeler on 07/23/2020 15:42:34 Englert, [name] ([id number]) -------------------------------------------------------------------------------- SuperBill Details Patient Name: [name] Date of Service: 07/23/2020 Medical Record Number: [id number] Patient Account Number: [id number] Date of Birth/Sex: [birth year] (84 y.o. M) Treating RN:  Dolan Amen Primary Care Provider: [name] Other Clinician: Referring Provider: [name] Treating Provider/Extender: Skipper Cliche in Treatment: 23 Diagnosis Coding ICD-10 Codes Code Description [phone] Pressure ulcer of left heel, stage 3 L97.512 Non-pressure chronic ulcer of other part of right foot with fat layer exposed G60.3 Idiopathic progressive neuropathy M21.372 Foot drop, left foot M21.371 Foot drop, right foot L03.116 Cellulitis of left lower limb Facility Procedures CPT4 Code: 58309407 Description: 68088 - DEB SUBQ TISSUE 20 SQ CM/< Modifier: Quantity: 1 CPT4 Code: Description: ICD-10 Diagnosis Description L97.512 Non-pressure chronic ulcer of other part of right foot with fat layer ex Modifier: posed Quantity: Physician Procedures CPT4 Code: 1103159 Description: 11042 - WC PHYS SUBQ TISS 20 SQ CM Modifier: Quantity: 1 CPT4 Code: Description: ICD-10 Diagnosis Description L97.512 Non-pressure chronic ulcer of other part of right foot with fat layer ex Modifier: posed Quantity: Electronic Signature(s) Signed: 07/23/2020 3:43:10 PM By: Worthy Keeler PA-C Previous Signature: 07/23/2020 10:03:03 AM Version By: Georges Mouse, Minus Breeding RN Entered By: Worthy Keeler on 07/23/2020 15:43:10

## 2020-07-25 NOTE — Progress Notes (Signed)
KENDRY, PFARR ([id number]) Visit Report for 07/23/2020 Arrival Information Details Patient Name: [name], [name] Date of Service: 07/23/2020 9:00 AM Medical Record Number: [id number] Patient Account Number: [id number] Date of Birth/Sex: 01-Jan-1936 (85 y.o. M) Treating RN: Cornell Barman Primary Care Avalin Briley: [name] Other Clinician: Referring Berkeley Veldman: [name] Treating Elza Varricchio/Extender: Skipper Cliche in Treatment: 23 Visit Information History Since Last Visit Pain Present Now: No Patient Arrived: Walker Arrival Time: 09:19 Accompanied By: self Transfer Assistance: None Patient Identification Verified: Yes Secondary Verification Process Completed: Yes Patient Requires Transmission-Based Precautions: No Patient Has Alerts: No Electronic Signature(s) Signed: 07/24/2020 5:45:00 PM By: Gretta Cool, BSN, RN, CWS, Kim RN, BSN Entered By: Gretta Cool, BSN, RN, CWS, Kim on 07/23/2020 09:19:50 [name] ([id number]) -------------------------------------------------------------------------------- Clinic Level of Care Assessment Details Patient Name: [name] Date of Service: 07/23/2020 9:00 AM Medical Record Number: [id number] Patient Account Number: [id number] Date of Birth/Sex: March 01, 1936 (85 y.o. M) Treating RN: Dolan Amen Primary Care Taison Celani: [name] Other Clinician: Referring Jakyrie Totherow: [name] Treating Hadassa Cermak/Extender: Skipper Cliche in Treatment: 23 Clinic Level of Care Assessment Items TOOL 1 Quantity Score []  - Use when EandM and Procedure is performed on INITIAL visit 0 ASSESSMENTS - Nursing Assessment / Reassessment []  - General Physical Exam (combine w/ comprehensive assessment (listed just below) when performed on new 0 pt. evals) []  - 0 Comprehensive Assessment (HX, ROS, Risk Assessments, Wounds Hx, etc.) ASSESSMENTS - Wound and Skin Assessment / Reassessment []  - Dermatologic / Skin Assessment (not related to wound area)  0 ASSESSMENTS - Ostomy and/or Continence Assessment and Care []  - Incontinence Assessment and Management 0 []  - 0 Ostomy Care Assessment and Management (repouching, etc.) PROCESS - Coordination of Care []  - Simple Patient / Family Education for ongoing care 0 []  - 0 Complex (extensive) Patient / Family Education for ongoing care []  - 0 Staff obtains Programmer, systems, Records, Test Results / Process Orders []  - 0 Staff telephones HHA, Nursing Homes / Clarify orders / etc []  - 0 Routine Transfer to another Facility (non-emergent condition) []  - 0 Routine Hospital Admission (non-emergent condition) []  - 0 New Admissions / Biomedical engineer / Ordering NPWT, Apligraf, etc. []  - 0 Emergency Hospital Admission (emergent condition) PROCESS - Special Needs []  - Pediatric / Minor Patient Management 0 []  - 0 Isolation Patient Management []  - 0 Hearing / Language / Visual special needs []  - 0 Assessment of Community assistance (transportation, D/C planning, etc.) []  - 0 Additional assistance / Altered mentation []  - 0 Support Surface(s) Assessment (bed, cushion, seat, etc.) INTERVENTIONS - Miscellaneous []  - External ear exam 0 []  - 0 Patient Transfer (multiple staff / Civil Service fast streamer / Similar devices) []  - 0 Simple Staple / Suture removal (25 or less) []  - 0 Complex Staple / Suture removal (26 or more) []  - 0 Hypo/Hyperglycemic Management (do not check if billed separately) []  - 0 Ankle / Brachial Index (ABI) - do not check if billed separately Has the patient been seen at the hospital within the last three years: Yes Total Score: 0 Level Of Care: ____ [name] ([id number]) Electronic Signature(s) Signed: 07/23/2020 10:03:03 AM By: Georges Mouse, Minus Breeding RN Entered By: Georges Mouse, Minus Breeding on 07/23/2020 09:50:35 [name] ([id number]) -------------------------------------------------------------------------------- Encounter Discharge Information Details Patient Name: [name] Date of Service: 07/23/2020 9:00 AM Medical Record Number: [id number] Patient Account Number: [id number] Date of Birth/Sex: Jun 05, 1936 (85 y.o. M) Treating RN: Dolan Amen Primary Care Sequoya Hogsett: [name] Other Clinician: Referring Dyllen Menning: [name] Treating Maxx Calaway/Extender: Joaquim Lai,  Vance Gather in Treatment: 23 Encounter Discharge Information Items Post Procedure Vitals Discharge Condition: Stable Temperature (F): 98 Ambulatory Status: Ambulatory Pulse (bpm): 78 Discharge Destination: Home Respiratory Rate (breaths/min): 18 Transportation: Private Auto Blood Pressure (mmHg): 121/79 Accompanied By: self Schedule Follow-up Appointment: Yes Clinical Summary of Care: Electronic Signature(s) Signed: 07/23/2020 10:03:03 AM By: Georges Mouse, Minus Breeding RN Entered By: Georges Mouse, Minus Breeding on 07/23/2020 09:51:51 [name] ([id number]) -------------------------------------------------------------------------------- Lower Extremity Assessment Details Patient Name: [name] Date of Service: 07/23/2020 9:00 AM Medical Record Number: [id number] Patient Account Number: [id number] Date of Birth/Sex: 21-Jul-1935 (85 y.o. M) Treating RN: Cornell Barman Primary Care Lyndon Chenoweth: [name] Other Clinician: Referring Brazen Domangue: [name] Treating Azim Gillingham/Extender: Skipper Cliche in Treatment: 23 Edema Assessment Assessed: [Left: No] Patrice Paradise: No] [Left: Edema] [Right: :] Calf Left: Right: Point of Measurement: 32 cm From Medial Instep 42 cm Ankle Left: Right: Point of Measurement: 12 cm From Medial Instep 28 cm Vascular Assessment Pulses: Dorsalis Pedis Palpable: [Left:Yes] Electronic Signature(s) Signed: 07/24/2020 5:45:00 PM By: Gretta Cool, BSN, RN, CWS, Kim RN, BSN Entered By: Gretta Cool, BSN, RN, CWS, Kim on 07/23/2020 09:28:52 [name] ([id number]) -------------------------------------------------------------------------------- Multi  Wound Chart Details Patient Name: [name] Date of Service: 07/23/2020 9:00 AM Medical Record Number: [id number] Patient Account Number: [id number] Date of Birth/Sex: 09-24-1935 (84 y.o. M) Treating RN: Dolan Amen Primary Care Carolanne Mercier: [name] Other Clinician: Referring Korissa Horsford: [name] Treating Kearston Putman/Extender: Skipper Cliche in Treatment: 23 Vital Signs Height(in): 72 Pulse(bpm): 28 Weight(lbs): 200 Blood Pressure(mmHg): 121/79 Body Mass Index(BMI): 27 Temperature(F): 98 Respiratory Rate(breaths/min): 18 Photos: [N/A:N/A] Wound Location: Left, Plantar Calcaneus N/A N/A Wounding Event: Trauma N/A N/A Primary Etiology: Trauma, Other N/A N/A Secondary Etiology: Pressure Ulcer N/A N/A Comorbid History: Hypertension, Myocardial Infarction, N/A N/A Osteoarthritis, Neuropathy Date Acquired: 10/21/2019 N/A N/A Weeks of Treatment: 23 N/A N/A Wound Status: Open N/A N/A Measurements L x W x D (cm) 0.3x0.4x0.2 N/A N/A Area (cm) : 0.094 N/A N/A Volume (cm) : 0.019 N/A N/A % Reduction in Area: 52.00% N/A N/A % Reduction in Volume: 75.90% N/A N/A Classification: Full Thickness Without Exposed N/A N/A Support Structures Exudate Amount: Medium N/A N/A Exudate Type: Serosanguineous N/A N/A Exudate Color: red, brown N/A N/A Wound Margin: Flat and Intact N/A N/A Granulation Amount: Large (67-100%) N/A N/A Granulation Quality: Pink N/A N/A Necrotic Amount: None Present (0%) N/A N/A Exposed Structures: Fat Layer (Subcutaneous Tissue): N/A N/A Yes Fascia: No Tendon: No Muscle: No Joint: No Bone: No Epithelialization: Small (1-33%) N/A N/A Treatment Notes Electronic Signature(s) Signed: 07/23/2020 10:03:03 AM By: Georges Mouse, Minus Breeding RN Entered By: Georges Mouse, Minus Breeding on 07/23/2020 09:43:54 [name] ([id number]) -------------------------------------------------------------------------------- Multi-Disciplinary Care Plan  Details Patient Name: [name] Date of Service: 07/23/2020 9:00 AM Medical Record Number: [id number] Patient Account Number: [id number] Date of Birth/Sex: 1936-04-24 (84 y.o. M) Treating RN: Dolan Amen Primary Care Adelynn Gipe: [name] Other Clinician: Referring Mikiah Durall: [name] Treating Nailyn Dearinger/Extender: Skipper Cliche in Treatment: 23 Active Inactive Pressure Nursing Diagnoses: Potential for impaired tissue integrity related to pressure, friction, moisture, and shear Goals: Patient/caregiver will verbalize understanding of pressure ulcer management Date Initiated: 06/19/2020 Target Resolution Date: 07/10/2020 Goal Status: Active Interventions: Assess offloading mechanisms upon admission and as needed Notes: Electronic Signature(s) Signed: 07/23/2020 10:03:03 AM By: Georges Mouse, Minus Breeding RN Entered By: Georges Mouse, Minus Breeding on 07/23/2020 09:43:46 Luera, [name] ([id number]) -------------------------------------------------------------------------------- Pain Assessment Details Patient Name: [name] Date of Service: 07/23/2020 9:00 AM Medical Record Number: [id number] Patient Account Number: [id number] Date of  Birth/Sex: 11-Sep-1935 (85 y.o. M) Treating RN: Cornell Barman Primary Care Braylen Staller: [name] Other Clinician: Referring Shalea Tomczak: [name] Treating Acsa Estey/Extender: Skipper Cliche in Treatment: 23 Active Problems Location of Pain Severity and Description of Pain Patient Has Paino No Site Locations Pain Management and Medication Current Pain Management: Electronic Signature(s) Signed: 07/24/2020 5:45:00 PM By: Gretta Cool, BSN, RN, CWS, Kim RN, BSN Entered By: Gretta Cool, BSN, RN, CWS, Kim on 07/23/2020 09:20:38 [name] ([id number]) -------------------------------------------------------------------------------- Patient/Caregiver Education Details Patient Name: [name] Date of Service: 07/23/2020 9:00 AM Medical  Record Number: [id number] Patient Account Number: [id number] Date of Birth/Gender: 06-16-1935 (84 y.o. M) Treating RN: Dolan Amen Primary Care Physician: [name] Other Clinician: Referring Physician: [name] Treating Physician/Extender: Skipper Cliche in Treatment: 23 Education Assessment Education Provided To: Patient Education Topics Provided Wound/Skin Impairment: Methods: Explain/Verbal Responses: State content correctly Electronic Signature(s) Signed: 07/23/2020 10:03:03 AM By: Georges Mouse, Minus Breeding RN Entered By: Georges Mouse, Minus Breeding on 07/23/2020 09:50:57 Paradiso, [name] ([id number]) -------------------------------------------------------------------------------- Wound Assessment Details Patient Name: [name] Date of Service: 07/23/2020 9:00 AM Medical Record Number: [id number] Patient Account Number: [id number] Date of Birth/Sex: 1935-12-27 (84 y.o. M) Treating RN: Cornell Barman Primary Care Dayelin Balducci: [name] Other Clinician: Referring Lanard Arguijo: [name] Treating Richelle Glick/Extender: Skipper Cliche in Treatment: 23 Wound Status Wound Number: 1 Primary Etiology: Trauma, Other Wound Location: Left, Plantar Calcaneus Secondary Pressure Ulcer Etiology: Wounding Event: Trauma Wound Status: Open Date Acquired: 10/21/2019 Comorbid Hypertension, Myocardial Infarction, Osteoarthritis, Weeks Of Treatment: 23 History: Neuropathy Clustered Wound: No Photos Wound Measurements Length: (cm) 0.3 Width: (cm) 0.4 Depth: (cm) 0.2 Area: (cm) 0.094 Volume: (cm) 0.019 % Reduction in Area: 52% % Reduction in Volume: 75.9% Epithelialization: Small (1-33%) Tunneling: No Undermining: No Wound Description Classification: Full Thickness Without Exposed Support Structures Wound Margin: Flat and Intact Exudate Amount: Medium Exudate Type: Serosanguineous Exudate Color: red, brown Foul Odor After Cleansing:  No Slough/Fibrino Yes Wound Bed Granulation Amount: Large (67-100%) Exposed Structure Granulation Quality: Pink Fascia Exposed: No Necrotic Amount: None Present (0%) Fat Layer (Subcutaneous Tissue) Exposed: Yes Tendon Exposed: No Muscle Exposed: No Joint Exposed: No Bone Exposed: No Treatment Notes Wound #1 (Calcaneus) Wound Laterality: Plantar, Left Cleanser Normal Saline Discharge Instruction: Wash your hands with soap and water. Remove old dressing, discard into plastic bag and place into trash. Cleanse the wound with Normal Saline prior to applying a clean dressing using gauze sponges, not tissues or cotton balls. Do not scrub or use excessive force. Pat dry using gauze sponges, not tissue or cotton balls. LATRAVIS, GRINE ([id number]) Peri-Wound Care Moisturizing Lotion Discharge Instruction: Suggestions: Theraderm, Eucerin, Cetaphil, or patient preference. Topical Primary Dressing Silvercel Small 2x2 (in/in) Discharge Instruction: Apply Silvercel Small 2x2 (in/in) as instructed Secondary Dressing Foam Dressing, 4x4 (in/in) Secured With 45M Medipore H Soft Cloth Surgical Tape, 2x2 (in/yd) Compression Wrap Compression Stockings Environmental education officer) Signed: 07/24/2020 5:45:00 PM By: Gretta Cool, BSN, RN, CWS, Kim RN, BSN Entered By: Gretta Cool, BSN, RN, CWS, Kim on 07/23/2020 09:27:22 [name] ([id number]) -------------------------------------------------------------------------------- Vitals Details Patient Name: [name] Date of Service: 07/23/2020 9:00 AM Medical Record Number: [id number] Patient Account Number: [id number] Date of Birth/Sex: 24-Jan-1936 (84 y.o. M) Treating RN: Cornell Barman Primary Care Kerria Sapien: [name] Other Clinician: Referring Gaia Gullikson: [name] Treating Prabhav Faulkenberry/Extender: Skipper Cliche in Treatment: 23 Vital Signs Time Taken: 09:19 Temperature (F): 98 Height (in): 72 Pulse (bpm): 78 Weight (lbs): 200 Respiratory  Rate (breaths/min): 18 Body Mass Index (BMI): 27.1 Blood Pressure (mmHg): 121/79 Reference  Range: 80 - 120 mg / dl Electronic Signature(s) Signed: 07/24/2020 5:45:00 PM By: Gretta Cool, BSN, RN, CWS, Kim RN, BSN Entered By: Gretta Cool, BSN, RN, CWS, Kim on 07/23/2020 09:20:10

## 2020-07-30 ENCOUNTER — Other Ambulatory Visit: Payer: Self-pay

## 2020-07-30 ENCOUNTER — Encounter: Payer: Medicare Other | Admitting: Physician Assistant

## 2020-07-30 DIAGNOSIS — L89623 Pressure ulcer of left heel, stage 3: Secondary | ICD-10-CM | POA: Diagnosis not present

## 2020-07-30 NOTE — Progress Notes (Addendum)
[name] ([id number]) Visit Report for 07/30/2020 Chief Complaint Document Details Patient Name: [name] Date of Service: 07/30/2020 9:00 AM Medical Record Number: [id number] Patient Account Number: [id number] Date of Birth/Sex: 1936-04-29 (84 y.o. M) Treating RN: Dolan Amen Primary Care Provider: [name] Other Clinician: Referring Provider: [name] Treating Provider/Extender: Skipper Cliche in Treatment: 24 Information Obtained from: Patient Chief Complaint Pressure ulcer left heel Electronic Signature(s) Signed: 07/30/2020 9:56:32 AM By: Worthy Keeler PA-C Entered By: Worthy Keeler on 07/30/2020 09:56:31 Heinlein, [name] ([id number]) -------------------------------------------------------------------------------- Debridement Details Patient Name: [name] Date of Service: 07/30/2020 9:00 AM Medical Record Number: [id number] Patient Account Number: [id number] Date of Birth/Sex: 01/12/1936 (84 y.o. M) Treating RN: Dolan Amen Primary Care Provider: [name] Other Clinician: Referring Provider: [name] Treating Provider/Extender: Skipper Cliche in Treatment: 24 Debridement Performed for Wound #1 Left,Plantar Calcaneus Assessment: Performed By: Physician Tommie Holt., PA-C Debridement Type: Debridement Level of Consciousness (Pre- Awake and Alert procedure): Pre-procedure Verification/Time Out Yes - 09:48 Taken: Total Area Debrided (L x W): 2 (cm) x 7 (cm) = 14 (cm) Tissue and other material Callus, Slough, Subcutaneous, Slough debrided: Level: Skin/Subcutaneous Tissue Debridement Description: Excisional Instrument: Curette Bleeding: Minimum Hemostasis Achieved: Pressure Response to Treatment: Procedure was tolerated well Level of Consciousness (Post- Awake and Alert procedure): Post Debridement Measurements of Total Wound Length: (cm) 0.3 Width: (cm) 0.4 Depth: (cm) 0.6 Volume: (cm)  0.057 Character of Wound/Ulcer Post Debridement: Stable Post Procedure Diagnosis Same as Pre-procedure Electronic Signature(s) Signed: 07/30/2020 12:41:55 PM By: Charlett Nose RN Signed: 07/30/2020 4:58:35 PM By: Worthy Keeler PA-C Entered By: Georges Mouse, [name] on 07/30/2020 09:52:21 [name] ([id number]) -------------------------------------------------------------------------------- HPI Details Patient Name: [name] Date of Service: 07/30/2020 9:00 AM Medical Record Number: [id number] Patient Account Number: [id number] Date of Birth/Sex: [birth year] (84 y.o. M) Treating RN: Dolan Amen Primary Care Provider: [name] Other Clinician: Referring Provider: [name] Treating Provider/Extender: Skipper Cliche in Treatment: 24 History of Present Illness HPI Description: 02/11/2020 on evaluation today patient appears to be doing somewhat poorly on initial evaluation concerning his heel. He has been using a topical antibiotic ointment which does not seem to really have been doing the best job for him. He tells me that he had a wound that occurred as a result of his padding in the bottom of his shoe wear his foot drop brace goes actually had worn through and his foot was actually rubbing on the bottom of the shoe on the brace itself. Subsequently this ended up with a wound and he in turn had a lot of issues following. Fortunately there is no signs of active infection at this time which is good news in my opinion. With that being said the wound does appear to be somewhat moist with some evidence of maceration unfortunately. He has no pain he does have neuropathy. He also has bilateral foot drop which she wears braces. This does appear to be however a pressure injury secondary to the AFO brace. The patient has been seen by podiatry since May 2021 unfortunately this is not healing hence the reason that he is coming to see [place] at this point to see if  there is anything we can do to help in this regard. 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 9/15; the patient missed his appointment last week due to illness with his wife who had  to be hospitalized. We are using Hydrofera Blue to the small punched out area in the middle of the left plantar heel. 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 This is felt to be pressure from the brace itself. 03/05/2020 upon evaluation today patient appears to be doing well in regard to his wound on the heel. He has been tolerating the dressing changes without complication which is great news. Fortunately there is no signs of active infection which I am very happy with. 03/19/2020 upon evaluation today patient appears to be doing a little worse in regard to his heel based on what I am seeing at this point. There actually appears to be a pocket off to the side laterally that is problematic for him at this point. I am not to clear this way with some sharp debridement currently. The patient was in agreement with that plan. 10/20; patient I do not usually see. He has a small wound on the left plantar heel. Some depth. He is apparently completing antibiotics. He has bilateral foot drop and therefore is AFO braces limiting what we can do to offload this. 04/14/2020 upon evaluation today patient's heel actually appears to be doing fairly well. There is no signs of active infection at this time which is great news and overall I feel like he is managing nicely. The only issue he has is that the individual who has been helping change the dressings at home unfortunately has not been available for the past week or so. She is out of town. Nonetheless he has been going to urgent care to have the dressings changed as he is not able to do this on his own and states that his wife is unable to do it for him. 11/10; patient  in today for a nurse visit but I was asked to look at him. I note that we are treating him for a small punched-out area on the left plantar heel. He arrived with a new superficial area close to this wound I am not really clear of the exact etiology of this. The bigger problem was increased swelling warmth of the lower leg below the knee but intense erythema involving the entire anterior knee and medial thigh. His entire leg is warm and swollen. However it was not completely obvious to me that there was a big change in the lower leg however the nurses and the patient certainly thought there was. The patient stated that after his 2-week of trimethoprim sulfamethoxazole his legs looked very similar. His vital signs are stable he is afebrile but nevertheless if this is all infection this is extensive and failing outpatient management 04/28/2020 on evaluation today patient presents for follow-up after having been seen in the ER and admitted to the hospital due to cellulitis in the left knee. He has continued to have issues at this point with regard to his knee it still very hot to touch upon evaluation today. His heel ulcer is actually measuring smaller than the last time I saw him but he still is having a lot of difficulty with dry and flaky skin over his feet in general. 05/05/2020 on evaluation today patient appears to be doing pretty well in regard to his heel ulcer. This is measuring a little smaller which is good news. Fortunately there is no signs of active infection at this time. No fevers, chills, nausea, vomiting, or diarrhea. He does have a new area on his right dorsal surface of his foot. With that being said  this region we really do not know exactly what happened where this came from it appears to be somewhat abnormal in that there is a very well demarcated line that almost looks as if he could have had a burn or something of the sort that was partially protected 1 area but got burned over a  different region. Either way I think we can probably get this healed fairly quickly. 05/14/2020 on evaluation today patient appears to be doing about the same in regard to his heel ulcer. We may need to try something different to see if this will be beneficial for him. I am thinking of switching him to collagen. 05/21/2020 on evaluation today patient appears to be doing excellent in regard to his foot ulcer from the standpoint of the overall appearance with that being said were just having some trouble getting it to granulate in. I do believe he would benefit from potentially utilizing a skin substitute though at this point in time at the end of the year I think it may be best to hold off till next year if we need to do this but I am going to switch things out a little bit regular try endoform to see if this will be beneficial for him. 05/28/2020 upon evaluation today patient's wound actually appears to be doing fairly well is very healthy and I am pleased with the overall appearance in that regard. With that being said I do not see any signs of active infection at this time which is great news. No fevers, chills, nausea, vomiting, or diarrhea. Unfortunately the adhesive is sticking to his skin around the wound and causing some minor skin tearing. We need to avoid this if at all possible. 06/04/2020 upon evaluation today patient appears to be doing really about the same in regard to his wound. With that being said there was some callus around the edges of the wound that I did decide to go ahead and tried to clean away and the patient actually tolerated me doing that today. I described what was found in the physical exam/assessment portion of this note 06/20/2019 upon evaluation today patient's heel ulcer actually showed signs of having some callus buildup around the edges of his wound though the wound itself appears to be doing a little bit better in my opinion. There is no signs of active infection at  this time. Overall the size of the wound is not significantly improved but again the quality does appear to be better. ALRIC, GEISE ([id number]) 07/02/2020 upon evaluation today patient's wound actually is showing signs of improvement which is great news. I do feel like it is doing quite well. No fevers, chills, nausea, vomiting, or diarrhea. 07/09/2020 upon evaluation today patient's wound on his heel actually appears to be doing quite well which is great news. There does not appear to be any signs of infection he does have a little bit of callus buildup again he we are limited in what we can do for the patient simply due to the fact that he unfortunately has to wear the braces for foot drop and this prevents appropriate and significant offloading. If we were able to do that I think this wound would already be healed nonetheless we are making some slight progress today. 07/16/2020 upon evaluation today patient appears to be doing about the same in regard to his heel ulcer unfortunately. There does not appear to be any signs of active infection at this time. No fevers, chills, nausea, vomiting, or diarrhea.  With that being said I am concerned about the fact that not really making a lot of progress here the wound appears to be somewhat wet I do believe that he would benefit from utilization of a silver alginate dressing as opposed to the collagen to try to see if we can get this under control. There does not appear to be any evidence of active infection at this time. No fevers, chills, nausea, vomiting, or diarrhea. 07/23/2020 upon evaluation today patient actually appears to be doing quite well with regard to his wound currently. There does not appear to be any signs of worsening which is great news. Fortunately there is no evidence of active infection either which is also excellent. No fevers, chills, nausea, vomiting, or diarrhea. 07/30/2020 upon evaluation today patient appears to be doing about the same  in regard to his heel ulcer. We need to try to get some of this dry cracked skin away with any use of the triamcinolone over this area that the patient has. I am also can recommend that we discontinue the use of a foam over this as I feel like it is keeping it too moist I would really prefer an ABD pad here to try to absorb some of the moisture. I think the stretch net is a good way to keep it in place. Electronic Signature(s) Signed: 07/30/2020 9:57:13 AM By: Worthy Keeler PA-C Entered By: Worthy Keeler on 07/30/2020 09:57:13 Pardue, [name] ([id number]) -------------------------------------------------------------------------------- Physical Exam Details Patient Name: [name] Date of Service: 07/30/2020 9:00 AM Medical Record Number: [id number] Patient Account Number: [id number] Date of Birth/Sex: 19-Apr-1936 (84 y.o. M) Treating RN: Dolan Amen Primary Care Provider: [name] Other Clinician: Referring Provider: [name] Treating Provider/Extender: Skipper Cliche in Treatment: 66 Constitutional Well-nourished and well-hydrated in no acute distress. Respiratory normal breathing without difficulty. Psychiatric this patient is able to make decisions and demonstrates good insight into disease process. Alert and Oriented x 3. pleasant and cooperative. Notes Upon inspection patient's wound bed actually showed signs of good granulation at this time. There does not appear to be any evidence of infection although he is also not making a tremendous amount of improvement as of yet I am hopeful that we can get some triamcinolone around the heel to help with some of this dry cracked area hopefully getting this moving in a much better direction. Electronic Signature(s) Signed: 07/30/2020 9:57:33 AM By: Worthy Keeler PA-C Entered By: Worthy Keeler on 07/30/2020 09:57:32 Thorns, [name]  ([id number]) -------------------------------------------------------------------------------- Physician Orders Details Patient Name: [name] Date of Service: 07/30/2020 9:00 AM Medical Record Number: [id number] Patient Account Number: [id number] Date of Birth/Sex: August 09, 1935 (84 y.o. 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 o Nurse Visit as needed Bathing/ Shower/ Hygiene Wound #1 Left,Plantar Calcaneus o May shower; gently cleanse wound with antibacterial soap, rinse and pat dry prior to dressing wounds Edema Control - Lymphedema / Segmental Compressive Device / Other Wound #1 Left,Plantar Calcaneus o Elevate, Exercise Daily and Avoid Standing for Long  Periods of Time. Wound Treatment Wound #1 - Calcaneus Wound Laterality: Plantar, Left Cleanser: Normal Saline (Generic) 3 x Per Week/30 Days Discharge Instructions: Wash your hands with soap and water. Remove old dressing, discard into plastic bag and place into trash. Cleanse the wound with Normal Saline prior to applying a clean dressing using gauze sponges, not tissues or cotton balls. Do not scrub or use excessive force. Pat dry using gauze sponges, not tissue or cotton balls. Peri-Wound Care: Triamcinolone Acetonide Cream, 0.1%, 15 (g) tube 3 x Per Week/30 Days Primary Dressing: Silvercel Small 2x2 (in/in) (Generic) 3 x Per Week/30  Days Discharge Instructions: Apply Silvercel Small 2x2 (in/in) as instructed Secondary Dressing: ABD Pad 5x9 (in/in) 3 x Per Week/30 Days Discharge Instructions: Cover with ABD pad Secured With: Stretch Net Size 10 (yds) 3 x Per Week/30 Days Discharge Instructions: To secure dressing in place. Electronic Signature(s) Signed: 07/30/2020 12:41:55 PM By: Georges Mouse, Marcus Breeding RN Signed: 07/30/2020 4:58:35 PM By: Worthy Keeler PA-C Entered By: Georges Mouse, [name] on 07/30/2020 09:55:38 Robben, [name] ([id number]) -------------------------------------------------------------------------------- Problem List Details Patient Name: [name] Date of Service: 07/30/2020 9:00 AM Medical Record Number: [id number] Patient Account Number: [id number] Date of Birth/Sex: Oct 04, 1935 (84 y.o. 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 02/11/2020 No Yes M21.372 Foot drop, left foot 02/11/2020 No Yes M21.371 Foot drop, right foot 02/11/2020 No Yes L03.116 Cellulitis of left lower limb 04/22/2020 No Yes Inactive Problems Resolved Problems Electronic Signature(s) Signed: 07/30/2020 9:46:17 AM By: Worthy Keeler PA-C Entered By: Worthy Keeler on 07/30/2020 09:46:17 [name], [name] ([id number]) -------------------------------------------------------------------------------- Progress Note Details Patient Name: [name] Date of Service: 07/30/2020 9:00 AM Medical Record Number: [id number] Patient Account Number: [id number] Date of Birth/Sex: 28-Dec-1935 (84 y.o. M) Treating RN:  Dolan Amen Primary Care Provider: [name] Other Clinician: Referring Provider: [name] Treating Provider/Extender: Skipper Cliche in Treatment: 24 Subjective Chief Complaint Information obtained from Patient Pressure ulcer left heel History of Present Illness (HPI) 02/11/2020 on evaluation today patient appears to be doing somewhat poorly on initial evaluation concerning his heel. He has been using a topical antibiotic ointment which does not seem to really have been doing the best job for him. He tells me that he had a wound that occurred as a result of his padding in the bottom of his shoe wear his foot drop brace goes actually had worn through and his foot was actually rubbing on the bottom of the shoe on the brace itself. Subsequently this ended up with a wound and he in turn had a lot of issues following. Fortunately there is no signs of active infection at this time which is good news in my opinion. With that being said the wound does appear to be somewhat moist with some evidence of maceration unfortunately. He has no pain he does have neuropathy. He also has bilateral foot drop which she wears braces. This does appear to be however a pressure injury secondary to the AFO brace. The patient has been seen by podiatry since May 2021 unfortunately this is not healing hence the reason that he is coming to see [place] at this point to  see if there is anything we can do to help in this regard. 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 9/15; the patient missed his appointment last week due to illness with his wife who had to be hospitalized. We are using Hydrofera Blue to the small punched out area in the middle of the left plantar heel. 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 This is felt to be pressure from the brace itself. 03/05/2020 upon  evaluation today patient appears to be doing well in regard to his wound on the heel. He has been tolerating the dressing changes without complication which is great news. Fortunately there is no signs of active infection which I am very happy with. 03/19/2020 upon evaluation today patient appears to be doing a little worse in regard to his heel based on what I am seeing at this point. There actually appears to be a pocket off to the side laterally that is problematic for him at this point. I am not to clear this way with some sharp debridement currently. The patient was in agreement with that plan. 10/20; patient I do not usually see. He has a small wound on the left plantar heel. Some depth. He is apparently completing antibiotics. He has bilateral foot drop and therefore is AFO braces limiting what we can do to offload this. 04/14/2020 upon evaluation today patient's heel actually appears to be doing fairly well. There is no signs of active infection at this time which is great news and overall I feel like he is managing nicely. The only issue he has is that the individual who has been helping change the dressings at home unfortunately has not been available for the past week or so. She is out of town. Nonetheless he has been going to urgent care to have the dressings changed as he is not able to do this on his own and states that his wife is unable to do it for him. 11/10; patient in today for a nurse visit but I was asked to look at him. I note that we are treating him for a small punched-out area on the left plantar heel. He arrived with a new superficial area close to this wound I am not really clear of the exact etiology of this. The bigger problem was increased swelling warmth of the lower leg below the knee but intense erythema involving the entire anterior knee and medial thigh. His entire leg is warm and swollen. However it was not completely obvious to me that there was a big change in the  lower leg however the nurses and the patient certainly thought there was. The patient stated that after his 2-week of trimethoprim sulfamethoxazole his legs looked very similar. His vital signs are stable he is afebrile but nevertheless if this is all infection this is extensive and failing outpatient management 04/28/2020 on evaluation today patient presents for follow-up after having been seen in the ER and admitted to the hospital due to cellulitis in the left knee. He has continued to have issues at this point with regard to his knee it still very hot to touch upon evaluation today. His heel ulcer is actually measuring smaller than the last time I saw him but he still is having a lot of difficulty with dry and flaky skin over his feet in general. 05/05/2020 on evaluation today patient appears to be doing  pretty well in regard to his heel ulcer. This is measuring a little smaller which is good news. Fortunately there is no signs of active infection at this time. No fevers, chills, nausea, vomiting, or diarrhea. He does have a new area on his right dorsal surface of his foot. With that being said this region we really do not know exactly what happened where this came from it appears to be somewhat abnormal in that there is a very well demarcated line that almost looks as if he could have had a burn or something of the sort that was partially protected 1 area but got burned over a different region. Either way I think we can probably get this healed fairly quickly. 05/14/2020 on evaluation today patient appears to be doing about the same in regard to his heel ulcer. We may need to try something different to see if this will be beneficial for him. I am thinking of switching him to collagen. 05/21/2020 on evaluation today patient appears to be doing excellent in regard to his foot ulcer from the standpoint of the overall appearance with that being said were just having some trouble getting it to granulate  in. I do believe he would benefit from potentially utilizing a skin substitute though at this point in time at the end of the year I think it may be best to hold off till next year if we need to do this but I am going to switch things out a little bit regular try endoform to see if this will be beneficial for him. 05/28/2020 upon evaluation today patient's wound actually appears to be doing fairly well is very healthy and I am pleased with the overall appearance in that regard. With that being said I do not see any signs of active infection at this time which is great news. No fevers, chills, nausea, vomiting, or diarrhea. Unfortunately the adhesive is sticking to his skin around the wound and causing some minor skin tearing. We need to avoid this if at all possible. 06/04/2020 upon evaluation today patient appears to be doing really about the same in regard to his wound. With that being said there was some callus around the edges of the wound that I did decide to go ahead and tried to clean away and the patient actually tolerated me doing that today. I described what was found in the physical exam/assessment portion of this note IZAEL, BESSINGER ([id number]) 06/20/2019 upon evaluation today patient's heel ulcer actually showed signs of having some callus buildup around the edges of his wound though the wound itself appears to be doing a little bit better in my opinion. There is no signs of active infection at this time. Overall the size of the wound is not significantly improved but again the quality does appear to be better. 07/02/2020 upon evaluation today patient's wound actually is showing signs of improvement which is great news. I do feel like it is doing quite well. No fevers, chills, nausea, vomiting, or diarrhea. 07/09/2020 upon evaluation today patient's wound on his heel actually appears to be doing quite well which is great news. There does not appear to be any signs of infection he does have a  little bit of callus buildup again he we are limited in what we can do for the patient simply due to the fact that he unfortunately has to wear the braces for foot drop and this prevents appropriate and significant offloading. If we were able to do that I think  this wound would already be healed nonetheless we are making some slight progress today. 07/16/2020 upon evaluation today patient appears to be doing about the same in regard to his heel ulcer unfortunately. There does not appear to be any signs of active infection at this time. No fevers, chills, nausea, vomiting, or diarrhea. With that being said I am concerned about the fact that not really making a lot of progress here the wound appears to be somewhat wet I do believe that he would benefit from utilization of a silver alginate dressing as opposed to the collagen to try to see if we can get this under control. There does not appear to be any evidence of active infection at this time. No fevers, chills, nausea, vomiting, or diarrhea. 07/23/2020 upon evaluation today patient actually appears to be doing quite well with regard to his wound currently. There does not appear to be any signs of worsening which is great news. Fortunately there is no evidence of active infection either which is also excellent. No fevers, chills, nausea, vomiting, or diarrhea. 07/30/2020 upon evaluation today patient appears to be doing about the same in regard to his heel ulcer. We need to try to get some of this dry cracked skin away with any use of the triamcinolone over this area that the patient has. I am also can recommend that we discontinue the use of a foam over this as I feel like it is keeping it too moist I would really prefer an ABD pad here to try to absorb some of the moisture. I think the stretch net is a good way to keep it in place. Objective Constitutional Well-nourished and well-hydrated in no acute distress. Vitals Time Taken: 9:16 AM, Height: 72  in, Weight: 200 lbs, BMI: 27.1, Temperature: 98.4 F, Pulse: 88 bpm, Respiratory Rate: 18 breaths/min, Blood Pressure: 121/77 mmHg. Respiratory normal breathing without difficulty. Psychiatric this patient is able to make decisions and demonstrates good insight into disease process. Alert and Oriented x 3. pleasant and cooperative. General Notes: Upon inspection patient's wound bed actually showed signs of good granulation at this time. There does not appear to be any evidence of infection although he is also not making a tremendous amount of improvement as of yet I am hopeful that we can get some triamcinolone around the heel to help with some of this dry cracked area hopefully getting this moving in a much better direction. Integumentary (Hair, Skin) Wound #1 status is Open. Original cause of wound was Trauma. The wound is located on the Left,Plantar Calcaneus. The wound measures 0.3cm length x 0.4cm width x 0.5cm depth; 0.094cm^2 area and 0.047cm^3 volume. There is Fat Layer (Subcutaneous Tissue) exposed. There is no tunneling noted, however, there is undermining starting at 8:00 and ending at 4:00 with a maximum distance of 0.4cm. There is a medium amount of serosanguineous drainage noted. The wound margin is flat and intact. There is large (67-100%) pink granulation within the wound bed. There is no necrotic tissue within the wound bed. 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 There was a Excisional Skin/Subcutaneous Tissue Debridement with a total area of 14 sq cm performed by Tommie Holt.,  PA-C. With the following instrument(s):  Curette Material removed includes Callus, Subcutaneous Tissue, and Slough. A time out was conducted at 09:48, prior to the start of the procedure. A Minimum amount of bleeding was controlled with Pressure. The procedure was tolerated well. Post Debridement Measurements: 0.3cm length x 0.4cm width x 0.6cm depth; 0.057cm^3 volume. Character of Wound/Ulcer Post Debridement is stable. Post procedure Diagnosis Wound #1: Same as Pre-Procedure Plan Follow-up Appointments: Wound #1 Left,Plantar Calcaneus: Return Appointment in 1 week. Nurse Visit as needed Bathing/ Shower/ Hygiene: Wound #1 Left,Plantar Calcaneus: May shower; gently cleanse wound with antibacterial soap, rinse and pat dry prior to dressing wounds Edema Control - Lymphedema / Segmental Compressive Device / Other: Wound #1 Left,Plantar Calcaneus: Elevate, Exercise Daily and Avoid Standing for Long Periods of Time. WOUND #1: - Calcaneus Wound Laterality: Plantar, Left Cleanser: Normal Saline (Generic) 3 x Per Week/30 Days Discharge Instructions: Wash your hands with soap and water. Remove old dressing, discard into plastic bag and place into trash. Cleanse the wound with Normal Saline prior to applying a clean dressing using gauze sponges, not tissues or cotton balls. Do not scrub or use excessive force. Pat dry using gauze sponges, not tissue or cotton balls. Peri-Wound Care: Triamcinolone Acetonide Cream, 0.1%, 15 (g) tube 3 x Per Week/30 Days Primary Dressing: Silvercel Small 2x2 (in/in) (Generic) 3 x Per Week/30 Days Discharge Instructions: Apply Silvercel Small 2x2 (in/in) as instructed Secondary Dressing: ABD Pad 5x9 (in/in) 3 x Per Week/30 Days Discharge Instructions: Cover with ABD pad Secured With: Stretch Net Size 10 (yds) 3 x Per Week/30 Days Discharge Instructions: To secure dressing in place. 1. Would recommend currently that we continue with the wound care measures as before with the silver alginate. 2. We will  discontinue the foam and go to an ABD pad to pad the area I think this will keep it dry or not trapping as much moisture. 3. We will also get a use triamcinolone around the wound on the healed to try to help with some of the dry cracked skin here it has helped everywhere else thus far. We will see patient back for reevaluation in 1 week here in the clinic. If anything worsens or changes patient will contact our office for additional recommendations. Electronic Signature(s) Signed: 07/30/2020 9:57:57 AM By: Worthy Keeler PA-C Entered By: Worthy Keeler on 07/30/2020 09:57:57 Wortmann, [name] ([id number]) -------------------------------------------------------------------------------- SuperBill Details Patient Name: [name] Date of Service: 07/30/2020 Medical Record Number: [id number] Patient Account Number: [id number] Date of Birth/Sex: 02-02-1936 (84 y.o. M) Treating RN: Dolan Amen Primary Care Provider: [name] Other Clinician: Referring Provider: [name] Treating Provider/Extender: Skipper Cliche in Treatment: 24 Diagnosis Coding ICD-10 Codes Code Description [phone] Pressure ulcer of left heel, stage 3 L97.512 Non-pressure chronic ulcer of other part of right foot with fat layer exposed G60.3 Idiopathic progressive neuropathy M21.372 Foot drop, left foot M21.371 Foot drop, right foot L03.116 Cellulitis of left lower limb Facility Procedures CPT4 Code: 42706237 Description: 62831 - DEB SUBQ TISSUE 20 SQ CM/< Modifier: Quantity: 1 CPT4 Code: Description: ICD-10 Diagnosis Description L89.623 Pressure ulcer of left heel, stage 3 Modifier: Quantity: Physician Procedures CPT4 Code: 5176160 Description: 11042 - WC PHYS SUBQ TISS 20 SQ CM Modifier: Quantity: 1 CPT4 Code: Description: ICD-10 Diagnosis Description L89.623 Pressure ulcer of left heel, stage 3 Modifier: Quantity: Electronic Signature(s) Signed: 07/30/2020 9:58:11 AM By: Worthy Keeler PA-C Entered By: Worthy Keeler on 07/30/2020 09:58:11

## 2020-07-31 NOTE — Progress Notes (Signed)
Marcus, Holt ([id number]) Visit Report for 07/30/2020 Arrival Information Details Patient Name: Marcus, Holt Date of Service: 07/30/2020 9:00 AM Medical Record Number: [id number] Patient Account Number: [id number] Date of Birth/Sex: 02-Jul-1935 (85 y.o. M) Treating RN: Cornell Barman Primary Care Ranette Luckadoo: [name] Other Clinician: Referring Khylen Riolo: [name] Treating Lumen Brinlee/Extender: Skipper Cliche in Treatment: 24 Visit Information History Since Last Visit Added or deleted any medications: No Patient Arrived: Walker Has Dressing in Place as Prescribed: Yes Arrival Time: 09:13 Pain Present Now: No Accompanied By: self Transfer Assistance: None Patient Identification Verified: Yes Secondary Verification Process Completed: Yes Patient Requires Transmission-Based Precautions: No Patient Has Alerts: No Electronic Signature(s) Signed: 07/31/2020 12:58:49 PM By: Gretta Cool, BSN, RN, CWS, Kim RN, BSN Entered By: Gretta Cool, BSN, RN, CWS, Kim on 07/30/2020 09:13:59 [name] ([id number]) -------------------------------------------------------------------------------- Clinic Level of Care Assessment Details Patient Name: [name] Date of Service: 07/30/2020 9:00 AM Medical Record Number: [id number] Patient Account Number: [id number] Date of Birth/Sex: 31-Aug-1935 (85 y.o. M) Treating RN: Dolan Amen Primary Care Natale Thoma: [name] Other Clinician: Referring Ryden Wainer: [name] Treating Jamier Urbas/Extender: Skipper Cliche in Treatment: 24 Clinic Level of Care Assessment Items TOOL 1 Quantity Score []  - Use when EandM and Procedure is performed on INITIAL visit 0 ASSESSMENTS - Nursing Assessment / Reassessment []  - General Physical Exam (combine w/ comprehensive assessment (listed just below) when performed on new 0 pt. evals) []  - 0 Comprehensive Assessment (HX, ROS, Risk Assessments, Wounds Hx, etc.) ASSESSMENTS - Wound and Skin Assessment  / Reassessment []  - Dermatologic / Skin Assessment (not related to wound area) 0 ASSESSMENTS - Ostomy and/or Continence Assessment and Care []  - Incontinence Assessment and Management 0 []  - 0 Ostomy Care Assessment and Management (repouching, etc.) PROCESS - Coordination of Care []  - Simple Patient / Family Education for ongoing care 0 []  - 0 Complex (extensive) Patient / Family Education for ongoing care []  - 0 Staff obtains Programmer, systems, Records, Test Results / Process Orders []  - 0 Staff telephones HHA, Nursing Homes / Clarify orders / etc []  - 0 Routine Transfer to another Facility (non-emergent condition) []  - 0 Routine Hospital Admission (non-emergent condition) []  - 0 New Admissions / Biomedical engineer / Ordering NPWT, Apligraf, etc. []  - 0 Emergency Hospital Admission (emergent condition) PROCESS - Special Needs []  - Pediatric / Minor Patient Management 0 []  - 0 Isolation Patient Management []  - 0 Hearing / Language / Visual special needs []  - 0 Assessment of Community assistance (transportation, D/C planning, etc.) []  - 0 Additional assistance / Altered mentation []  - 0 Support Surface(s) Assessment (bed, cushion, seat, etc.) INTERVENTIONS - Miscellaneous []  - External ear exam 0 []  - 0 Patient Transfer (multiple staff / Civil Service fast streamer / Similar devices) []  - 0 Simple Staple / Suture removal (25 or less) []  - 0 Complex Staple / Suture removal (26 or more) []  - 0 Hypo/Hyperglycemic Management (do not check if billed separately) []  - 0 Ankle / Brachial Index (ABI) - do not check if billed separately Has the patient been seen at the hospital within the last three years: Yes Total Score: 0 Level Of Care: ____ [name] ([id number]) Electronic Signature(s) Signed: 07/30/2020 12:41:55 PM By: Georges Mouse, Minus Breeding RN Entered By: Georges Mouse, Minus Breeding on 07/30/2020 09:53:37 Holt, Marcus Pickerel  ([id number]) -------------------------------------------------------------------------------- Encounter Discharge Information Details Patient Name: [name] Date of Service: 07/30/2020 9:00 AM Medical Record Number: [id number] Patient Account Number: [id number] Date of Birth/Sex: Jun 02, 1936 (85 y.o. M) Treating RN: Dolan Amen Primary  Care Toy Samarin: [name] Other Clinician: Referring Torri Michalski: [name] Treating Sammie Schermerhorn/Extender: Skipper Cliche in Treatment: 24 Encounter Discharge Information Items Post Procedure Vitals Discharge Condition: Stable Temperature (F): 98.4 Ambulatory Status: Walker Pulse (bpm): 88 Discharge Destination: Home Respiratory Rate (breaths/min): 18 Transportation: Private Auto Blood Pressure (mmHg): 122/77 Accompanied By: self Schedule Follow-up Appointment: Yes Clinical Summary of Care: Electronic Signature(s) Signed: 07/30/2020 12:41:55 PM By: Georges Mouse, Minus Breeding RN Entered By: Georges Mouse, Minus Breeding on 07/30/2020 09:56:15 [name] ([id number]) -------------------------------------------------------------------------------- Lower Extremity Assessment Details Patient Name: [name] Date of Service: 07/30/2020 9:00 AM Medical Record Number: [id number] Patient Account Number: [id number] Date of Birth/Sex: [birth year] (85 y.o. M) Treating RN: Cornell Barman Primary Care Lucita Montoya: [name] Other Clinician: Referring Yedidya Duddy: [name] Treating Sinan Tuch/Extender: Skipper Cliche in Treatment: 24 Edema Assessment Assessed: [Left: No] Patrice Paradise: No] [Left: Edema] [Right: :] Calf Left: Right: Point of Measurement: 32 cm From Medial Instep 41.2 cm Ankle Left: Right: Point of Measurement: 12 cm From Medial Instep 27.5 cm Vascular Assessment Pulses: Dorsalis Pedis Palpable: [Left:Yes] Electronic Signature(s) Signed: 07/31/2020 12:58:49 PM By: Gretta Cool, BSN, RN, CWS, Kim RN, BSN Entered By: Gretta Cool,  BSN, RN, CWS, Kim on 07/30/2020 09:26:13 [name] ([id number]) -------------------------------------------------------------------------------- Multi Wound Chart Details Patient Name: [name] Date of Service: 07/30/2020 9:00 AM Medical Record Number: [id number] Patient Account Number: [id number] Date of Birth/Sex: 25-Jul-1935 (85 y.o. M) Treating RN: Dolan Amen Primary Care Esmae Donathan: [name] Other Clinician: Referring Anastasija Anfinson: [name] Treating Lucine Bilski/Extender: Skipper Cliche in Treatment: 24 Vital Signs Height(in): 72 Pulse(bpm): 33 Weight(lbs): 200 Blood Pressure(mmHg): 121/77 Body Mass Index(BMI): 27 Temperature(F): 98.4 Respiratory Rate(breaths/min): 18 Photos: [N/A:N/A] Wound Location: Left, Plantar Calcaneus N/A N/A Wounding Event: Trauma N/A N/A Primary Etiology: Trauma, Other N/A N/A Secondary Etiology: Pressure Ulcer N/A N/A Comorbid History: Hypertension, Myocardial Infarction, N/A N/A Osteoarthritis, Neuropathy Date Acquired: 10/21/2019 N/A N/A Weeks of Treatment: 24 N/A N/A Wound Status: Open N/A N/A Measurements L x W x D (cm) 0.3x0.4x0.5 N/A N/A Area (cm) : 0.094 N/A N/A Volume (cm) : 0.047 N/A N/A % Reduction in Area: 52.00% N/A N/A % Reduction in Volume: 40.50% N/A N/A Starting Position 1 (o'clock): 8 Ending Position 1 (o'clock): 4 Maximum Distance 1 (cm): 0.4 Undermining: Yes N/A N/A Classification: Full Thickness Without Exposed N/A N/A Support Structures Exudate Amount: Medium N/A N/A Exudate Type: Serosanguineous N/A N/A Exudate Color: red, brown N/A N/A Wound Margin: Flat and Intact N/A N/A Granulation Amount: Large (67-100%) N/A N/A Granulation Quality: Pink N/A N/A Necrotic Amount: None Present (0%) N/A N/A Exposed Structures: Fat Layer (Subcutaneous Tissue): N/A N/A Yes Fascia: No Tendon: No Muscle: No Joint: No Bone: No Epithelialization: Small (1-33%) N/A N/A Treatment Notes Electronic  Signature(s) Signed: 07/30/2020 12:41:55 PM By: Charlett Nose RN Smelser, Saahir ([id number]) Entered By: Georges Mouse, Minus Breeding on 07/30/2020 09:47:43 [name] ([id number]) -------------------------------------------------------------------------------- Multi-Disciplinary Care Plan Details Patient Name: [name] Date of Service: 07/30/2020 9:00 AM Medical Record Number: [id number] Patient Account Number: [id number] Date of Birth/Sex: 09-06-1935 (85 y.o. M) Treating RN: Dolan Amen Primary Care Theodor Mustin: [name] Other Clinician: Referring Tajee Savant: [name] Treating Evangelene Vora/Extender: Skipper Cliche in Treatment: 24 Active Inactive Pressure Nursing Diagnoses: Potential for impaired tissue integrity related to pressure, friction, moisture, and shear Goals: Patient/caregiver will verbalize understanding of pressure ulcer management Date Initiated: 06/19/2020 Target Resolution Date: 07/10/2020 Goal Status: Active Interventions: Assess offloading mechanisms upon admission and as needed Notes: Electronic Signature(s) Signed: 07/30/2020 12:41:55 PM By: Charlett Nose RN  Entered By: Georges Mouse, Minus Breeding on 07/30/2020 09:47:18 Dobosz, Marcus Pickerel ([id number]) -------------------------------------------------------------------------------- Pain Assessment Details Patient Name: EDRIC, FETTERMAN Date of Service: 07/30/2020 9:00 AM Medical Record Number: [id number] Patient Account Number: [id number] Date of Birth/Sex: [birth year] (85 y.o. M) Treating RN: Cornell Barman Primary Care Hammond Obeirne: [name] Other Clinician: Referring Briggette Najarian: [name] Treating Esma Kilts/Extender: Skipper Cliche in Treatment: 24 Active Problems Location of Pain Severity and Description of Pain Patient Has Paino No Site Locations Pain Management and Medication Current Pain Management: Notes Patient denies pain at this time. Electronic  Signature(s) Signed: 07/31/2020 12:58:49 PM By: Gretta Cool, BSN, RN, CWS, Kim RN, BSN Entered By: Gretta Cool, BSN, RN, CWS, Kim on 07/30/2020 09:16:45 [name] ([id number]) -------------------------------------------------------------------------------- Patient/Caregiver Education Details Patient Name: [name] Date of Service: 07/30/2020 9:00 AM Medical Record Number: [id number] Patient Account Number: [id number] Date of Birth/Gender: [birth year] (85 y.o. M) Treating RN: Dolan Amen Primary Care Physician: [name] Other Clinician: Referring Physician: [name] Treating Physician/Extender: Skipper Cliche in Treatment: 24 Education Assessment Education Provided To: Patient Education Topics Provided Wound Debridement: Methods: Explain/Verbal Responses: State content correctly Wound/Skin Impairment: Methods: Explain/Verbal Responses: State content correctly Electronic Signature(s) Signed: 07/30/2020 12:41:55 PM By: Georges Mouse, Minus Breeding RN Entered By: Georges Mouse, Minus Breeding on 07/30/2020 09:54:48 Youse, Marcus Pickerel ([id number]) -------------------------------------------------------------------------------- Wound Assessment Details Patient Name: [name] Date of Service: 07/30/2020 9:00 AM Medical Record Number: [id number] Patient Account Number: [id number] Date of Birth/Sex: 1936-01-06 (85 y.o. M) Treating RN: Cornell Barman Primary Care Azariah Bonura: [name] Other Clinician: Referring Lakeasha Petion: [name] Treating Gerald Honea/Extender: Skipper Cliche in Treatment: 24 Wound Status Wound Number: 1 Primary Etiology: Trauma, Other Wound Location: Left, Plantar Calcaneus Secondary Pressure Ulcer Etiology: Wounding Event: Trauma Wound Status: Open Date Acquired: 10/21/2019 Comorbid Hypertension, Myocardial Infarction, Osteoarthritis, Weeks Of Treatment: 24 History: Neuropathy Clustered Wound: No Photos Wound Measurements Length: (cm)  0.3 Width: (cm) 0.4 Depth: (cm) 0.5 Area: (cm) 0.094 Volume: (cm) 0.047 % Reduction in Area: 52% % Reduction in Volume: 40.5% Epithelialization: Small (1-33%) Tunneling: No Undermining: Yes Starting Position (o'clock): 8 Ending Position (o'clock): 4 Maximum Distance: (cm) 0.4 Wound Description Classification: Full Thickness Without Exposed Support Structu Wound Margin: Flat and Intact Exudate Amount: Medium Exudate Type: Serosanguineous Exudate Color: red, brown res Foul Odor After Cleansing: No Slough/Fibrino Yes Wound Bed Granulation Amount: Large (67-100%) Exposed Structure Granulation Quality: Pink Fascia Exposed: No Necrotic Amount: None Present (0%) Fat Layer (Subcutaneous Tissue) Exposed: Yes Tendon Exposed: No Muscle Exposed: No Joint Exposed: No Bone Exposed: No Treatment Notes Wound #1 (Calcaneus) Wound Laterality: Plantar, Left Cleanser Lorincz, Carlyn ([id number]) Normal Saline Discharge Instruction: Wash your hands with soap and water. Remove old dressing, discard into plastic bag and place into trash. Cleanse the wound with Normal Saline prior to applying a clean dressing using gauze sponges, not tissues or cotton balls. Do not scrub or use excessive force. Pat dry using gauze sponges, not tissue or cotton balls. Peri-Wound Care Triamcinolone Acetonide Cream, 0.1%, 15 (g) tube Topical Primary Dressing Silvercel Small 2x2 (in/in) Discharge Instruction: Apply Silvercel Small 2x2 (in/in) as instructed Secondary Dressing ABD Pad 5x9 (in/in) Discharge Instruction: Cover with ABD pad Secured With Stretch Net Size 10 (yds) Discharge Instruction: To secure dressing in place. Compression Wrap Compression Stockings Add-Ons Electronic Signature(s) Signed: 07/31/2020 12:58:49 PM By: Gretta Cool, BSN, RN, CWS, Kim RN, BSN Entered By: Gretta Cool, BSN, RN, CWS, Kim on 07/30/2020 09:24:00 [name]  ([id number]) -------------------------------------------------------------------------------- Vitals Details Patient Name: [name] Date of Service: 07/30/2020 9:00 AM Medical  Record Number: [id number] Patient Account Number: [id number] Date of Birth/Sex: 07/29/1935 (85 y.o. M) Treating RN: Cornell Barman Primary Care Madelaine Whipple: [name] Other Clinician: Referring Karlynn Furrow: [name] Treating Havanna Groner/Extender: Skipper Cliche in Treatment: 24 Vital Signs Time Taken: 09:16 Temperature (F): 98.4 Height (in): 72 Pulse (bpm): 88 Weight (lbs): 200 Respiratory Rate (breaths/min): 18 Body Mass Index (BMI): 27.1 Blood Pressure (mmHg): 121/77 Reference Range: 80 - 120 mg / dl Electronic Signature(s) Signed: 07/31/2020 12:58:49 PM By: Gretta Cool, BSN, RN, CWS, Kim RN, BSN Entered By: Gretta Cool, BSN, RN, CWS, Kim on 07/30/2020 09:16:15

## 2020-08-06 ENCOUNTER — Other Ambulatory Visit: Payer: Self-pay

## 2020-08-06 ENCOUNTER — Encounter: Payer: Medicare Other | Admitting: Physician Assistant

## 2020-08-06 DIAGNOSIS — L89623 Pressure ulcer of left heel, stage 3: Secondary | ICD-10-CM | POA: Diagnosis not present

## 2020-08-06 NOTE — Progress Notes (Addendum)
Marcus, Holt ([id number]) Visit Report for 08/06/2020 Chief Complaint Document Details Patient Name: Marcus, Holt Date of Service: 08/06/2020 3:00 PM Medical Record Number: [id number] Patient Account Number: [id number] Date of Birth/Sex: Dec 15, 1935 (85 y.o. M) Treating RN: [name] Primary Care Provider: Nobie Holt Other Clinician: Referring Provider: Nobie Holt Treating Provider/Extender: [name] in Treatment: 25 Information Obtained from: Patient Chief Complaint Pressure ulcer left heel Electronic Signature(s) Signed: 08/06/2020 3:12:20 PM By: Worthy Keeler PA-C Entered By: Worthy Holt on 08/06/2020 15:12:19 Vessels, [name] ([id number]) -------------------------------------------------------------------------------- Debridement Details Patient Name: [name] Date of Service: 08/06/2020 3:00 PM Medical Record Number: [id number] Patient Account Number: [id number] Date of Birth/Sex: 02/13/1936 (84 y.o. M) Treating RN: [name] Primary Care Provider: Nobie Holt Other Clinician: Referring Provider: Nobie Holt Treating Provider/Extender: [name] in Treatment: 25 Debridement Performed for Wound #1 Left,Plantar Calcaneus Assessment: Performed By: Physician Marcus Sams., PA-C Debridement Type: Debridement Level of Consciousness (Pre- Awake and Alert procedure): Pre-procedure Verification/Time Out Yes - 16:08 Taken: Start Time: 16:08 Total Area Debrided (L x W): 0.3 (cm) x 0.4 (cm) = 0.12 (cm) Tissue and other material Viable, Non-Viable, Callus, Slough, Subcutaneous, Slough debrided: Level: Skin/Subcutaneous Tissue Debridement Description: Excisional Instrument: Curette Bleeding: Minimum Hemostasis Achieved: Pressure Response to Treatment: Procedure was tolerated well Level of Consciousness (Post- Awake and Alert procedure): Post Debridement Measurements of Total Wound Length: (cm) 0.3 Width:  (cm) 0.4 Depth: (cm) 0.3 Volume: (cm) 0.028 Character of Wound/Ulcer Post Debridement: Stable Post Procedure Diagnosis Same as Pre-procedure Electronic Signature(s) Signed: 08/06/2020 4:52:24 PM By: Marcus Nose RN Signed: 08/06/2020 5:47:45 PM By: Worthy Keeler PA-C Entered By: [name], [name] on 08/06/2020 16:09:25 [name] ([id number]) -------------------------------------------------------------------------------- HPI Details Patient Name: [name] Date of Service: 08/06/2020 3:00 PM Medical Record Number: [id number] Patient Account Number: [id number] Date of Birth/Sex: [birth year] (84 y.o. M) Treating RN: [name] Primary Care Provider: Nobie Holt Other Clinician: Referring Provider: Nobie Holt Treating Provider/Extender: [name] in Treatment: 25 History of Present Illness HPI Description: 02/11/2020 on evaluation today patient appears to be doing somewhat poorly on initial evaluation concerning his heel. He has been using a topical antibiotic ointment which does not seem to really have been doing the best job for him. He tells me that he had a wound that occurred as a result of his padding in the bottom of his shoe wear his foot drop brace goes actually had worn through and his foot was actually rubbing on the bottom of the shoe on the brace itself. Subsequently this ended up with a wound and he in turn had a lot of issues following. Fortunately there is no signs of active infection at this time which is good news in my opinion. With that being said the wound does appear to be somewhat moist with some evidence of maceration unfortunately. He has no pain he does have neuropathy. He also has bilateral foot drop which she wears braces. This does appear to be however a pressure injury secondary to the AFO brace. The patient has been seen by podiatry since May 2021 unfortunately this is not healing hence the reason that he is  coming to see [place] at this point to see if there is anything we can do to help in this regard. 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 9/15; the patient missed his appointment last week due to illness  with his wife who had to be hospitalized. We are using Hydrofera Blue to the small punched out area in the middle of the left plantar heel. 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 This is felt to be pressure from the brace itself. 03/05/2020 upon evaluation today patient appears to be doing well in regard to his wound on the heel. He has been tolerating the dressing changes without complication which is great news. Fortunately there is no signs of active infection which I am very happy with. 03/19/2020 upon evaluation today patient appears to be doing a little worse in regard to his heel based on what I am seeing at this point. There actually appears to be a pocket off to the side laterally that is problematic for him at this point. I am not to clear this way with some sharp debridement currently. The patient was in agreement with that plan. 10/20; patient I do not usually see. He has a small wound on the left plantar heel. Some depth. He is apparently completing antibiotics. He has bilateral foot drop and therefore is AFO braces limiting what we can do to offload this. 04/14/2020 upon evaluation today patient's heel actually appears to be doing fairly well. There is no signs of active infection at this time which is great news and overall I feel like he is managing nicely. The only issue he has is that the individual who has been helping change the dressings at home unfortunately has not been available for the past week or so. She is out of town. Nonetheless he has been going to urgent care to have the dressings changed as he is not able to do this on his own and states that his wife is  unable to do it for him. 11/10; patient in today for a nurse visit but I was asked to look at him. I note that we are treating him for a small punched-out area on the left plantar heel. He arrived with a new superficial area close to this wound I am not really clear of the exact etiology of this. The bigger problem was increased swelling warmth of the lower leg below the knee but intense erythema involving the entire anterior knee and medial thigh. His entire leg is warm and swollen. However it was not completely obvious to me that there was a big change in the lower leg however the nurses and the patient certainly thought there was. The patient stated that after his 2-week of trimethoprim sulfamethoxazole his legs looked very similar. His vital signs are stable he is afebrile but nevertheless if this is all infection this is extensive and failing outpatient management 04/28/2020 on evaluation today patient presents for follow-up after having been seen in the ER and admitted to the hospital due to cellulitis in the left knee. He has continued to have issues at this point with regard to his knee it still very hot to touch upon evaluation today. His heel ulcer is actually measuring smaller than the last time I saw him but he still is having a lot of difficulty with dry and flaky skin over his feet in general. 05/05/2020 on evaluation today patient appears to be doing pretty well in regard to his heel ulcer. This is measuring a little smaller which is good news. Fortunately there is no signs of active infection at this time. No fevers, chills, nausea, vomiting, or diarrhea. He does have a new area on his right dorsal surface of his  foot. With that being said this region we really do not know exactly what happened where this came from it appears to be somewhat abnormal in that there is a very well demarcated line that almost looks as if he could have had a burn or something of the sort that was partially  protected 1 area but got burned over a different region. Either way I think we can probably get this healed fairly quickly. 05/14/2020 on evaluation today patient appears to be doing about the same in regard to his heel ulcer. We may need to try something different to see if this will be beneficial for him. I am thinking of switching him to collagen. 05/21/2020 on evaluation today patient appears to be doing excellent in regard to his foot ulcer from the standpoint of the overall appearance with that being said were just having some trouble getting it to granulate in. I do believe he would benefit from potentially utilizing a skin substitute though at this point in time at the end of the year I think it may be best to hold off till next year if we need to do this but I am going to switch things out a little bit regular try endoform to see if this will be beneficial for him. 05/28/2020 upon evaluation today patient's wound actually appears to be doing fairly well is very healthy and I am pleased with the overall appearance in that regard. With that being said I do not see any signs of active infection at this time which is great news. No fevers, chills, nausea, vomiting, or diarrhea. Unfortunately the adhesive is sticking to his skin around the wound and causing some minor skin tearing. We need to avoid this if at all possible. 06/04/2020 upon evaluation today patient appears to be doing really about the same in regard to his wound. With that being said there was some callus around the edges of the wound that I did decide to go ahead and tried to clean away and the patient actually tolerated me doing that today. I described what was found in the physical exam/assessment portion of this note 06/20/2019 upon evaluation today patient's heel ulcer actually showed signs of having some callus buildup around the edges of his wound though the wound itself appears to be doing a little bit better in my opinion.  There is no signs of active infection at this time. Overall the size of the wound is not significantly improved but again the quality does appear to be better. CHIDERA, THIVIERGE ([id number]) 07/02/2020 upon evaluation today patient's wound actually is showing signs of improvement which is great news. I do feel like it is doing quite well. No fevers, chills, nausea, vomiting, or diarrhea. 07/09/2020 upon evaluation today patient's wound on his heel actually appears to be doing quite well which is great news. There does not appear to be any signs of infection he does have a little bit of callus buildup again he we are limited in what we can do for the patient simply due to the fact that he unfortunately has to wear the braces for foot drop and this prevents appropriate and significant offloading. If we were able to do that I think this wound would already be healed nonetheless we are making some slight progress today. 07/16/2020 upon evaluation today patient appears to be doing about the same in regard to his heel ulcer unfortunately. There does not appear to be any signs of active infection at this time. No fevers,  chills, nausea, vomiting, or diarrhea. With that being said I am concerned about the fact that not really making a lot of progress here the wound appears to be somewhat wet I do believe that he would benefit from utilization of a silver alginate dressing as opposed to the collagen to try to see if we can get this under control. There does not appear to be any evidence of active infection at this time. No fevers, chills, nausea, vomiting, or diarrhea. 07/23/2020 upon evaluation today patient actually appears to be doing quite well with regard to his wound currently. There does not appear to be any signs of worsening which is great news. Fortunately there is no evidence of active infection either which is also excellent. No fevers, chills, nausea, vomiting, or diarrhea. 07/30/2020 upon evaluation today  patient appears to be doing about the same in regard to his heel ulcer. We need to try to get some of this dry cracked skin away with any use of the triamcinolone over this area that the patient has. I am also can recommend that we discontinue the use of a foam over this as I feel like it is keeping it too moist I would really prefer an ABD pad here to try to absorb some of the moisture. I think the stretch net is a good way to keep it in place. 08/06/2020 upon evaluation today patient's heel actually appears to be doing okay today. Fortunately there is not appear to be any signs of active infection which is great news and I am pleased in that regard. I am going to perform sharp debridement clear away some of the callus as well as slough from the surface of the wound. The patient is in agreement with that plan. Electronic Signature(s) Signed: 08/06/2020 5:26:47 PM By: Worthy Keeler PA-C Entered By: Worthy Holt on 08/06/2020 17:26:47 [name] ([id number]) -------------------------------------------------------------------------------- Physical Exam Details Patient Name: [name] Date of Service: 08/06/2020 3:00 PM Medical Record Number: [id number] Patient Account Number: [id number] Date of Birth/Sex: [birth year] (84 y.o. M) Treating RN: [name] Primary Care Provider: Nobie Holt Other Clinician: Referring Provider: Nobie Holt Treating Provider/Extender: [name] in Treatment: 59 Constitutional Well-nourished and well-hydrated in no acute distress. Respiratory normal breathing without difficulty. Psychiatric this patient is able to make decisions and demonstrates good insight into disease process. Alert and Oriented x 3. pleasant and cooperative. Notes Upon inspection patient's wound bed actually showed signs of some granulation and some slough buildup I did perform sharp debridement to clear away the necrotic debris on the surface of the wound he  tolerated that today without complication also removed as much of the callus is possible and in general I feel like the patient is doing significantly better. Electronic Signature(s) Signed: 08/06/2020 5:27:11 PM By: Worthy Keeler PA-C Entered By: Worthy Holt on 08/06/2020 17:27:10 Holt, [name] ([id number]) -------------------------------------------------------------------------------- Physician Orders Details Patient Name: [name] Date of Service: 08/06/2020 3:00 PM Medical Record Number: [id number] Patient Account Number: [id number] Date of Birth/Sex: Sep 15, 1935 (84 y.o. 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 Bathing/ Shower/ Hygiene Wound #1 Left,Plantar Calcaneus o May shower; gently cleanse wound with antibacterial soap, rinse and pat dry prior to dressing wounds Edema Control - Lymphedema / Segmental Compressive Device / Other Wound #1 Left,Plantar Calcaneus o Elevate, Exercise Daily and Avoid Standing for Long Periods of Time. Wound Treatment Wound #1 - Calcaneus Wound Laterality: Plantar, Left Cleanser: Normal Saline (Generic) 3 x Per Week/30 Days Discharge Instructions: Wash your hands with soap and water. Remove old dressing, discard into plastic bag and place into trash. Cleanse the wound with Normal Saline prior to applying a clean dressing using gauze sponges, not tissues or  cotton balls. Do not scrub or use excessive force. Pat dry using gauze sponges, not tissue or cotton balls. Peri-Wound Care: Triamcinolone Acetonide Cream, 0.1%, 15 (g) tube 3 x Per Week/30 Days Primary Dressing: Silvercel Small 2x2 (in/in) (Generic) 3 x Per Week/30 Days Discharge Instructions: Apply Silvercel Small 2x2 (in/in) as instructed Secondary Dressing: ABD Pad 5x9 (in/in) 3 x Per Week/30 Days Discharge Instructions: Cover heel with ABD pad Secured With: Stretch Net Size 10 (yds) 3 x Per Week/30 Days Discharge Instructions: To secure dressing in place. Electronic Signature(s) Signed: 08/06/2020 4:52:24 PM By: [name], Marcus Breeding RN Signed: 08/06/2020 5:47:45 PM By: Worthy Keeler PA-C Entered By: [name], [name] on 08/06/2020 16:11:09 [name] ([id number]) -------------------------------------------------------------------------------- Problem List Details Patient Name: [name] Date of Service: 08/06/2020 3:00 PM Medical Record Number: [id number] Patient Account Number: [id number] Date of Birth/Sex: [birth year] (84 y.o. 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 02/11/2020 No Yes M21.372 Foot drop, left foot 02/11/2020 No Yes M21.371 Foot drop, right foot 02/11/2020 No Yes L03.116 Cellulitis of left lower limb 04/22/2020 No Yes Inactive Problems Resolved Problems Electronic Signature(s) Signed: 08/06/2020 3:12:12 PM By: Worthy Keeler PA-C Entered By: Worthy Holt on 08/06/2020 15:12:12 Grillot, [name]  ([id number]) -------------------------------------------------------------------------------- Progress Note Details Patient Name: [name] Date of Service: 08/06/2020 3:00 PM Medical Record Number: [id number] Patient Account Number: [id number] Date of Birth/Sex: 26-Sep-1935 (84 y.o. M) Treating RN: [name] Primary Care Provider: Nobie Holt Other Clinician: Referring Provider: Nobie Holt Treating Provider/Extender: [name] in Treatment: 25 Subjective Chief Complaint Information obtained from Patient Pressure ulcer left heel History of Present Illness (HPI) 02/11/2020 on evaluation today patient appears to be doing somewhat poorly on initial evaluation concerning his heel. He has been using a topical antibiotic ointment which does not seem to really have been doing the best job for him. He tells me that he had a wound that occurred as a result of his padding in the bottom of his shoe wear his foot drop brace goes actually had worn through and his foot was actually rubbing on the bottom of the shoe on the brace itself. Subsequently this ended up with a wound and he in turn had a lot of issues following. Fortunately there is no signs of active infection at this time which is good news in my opinion. With that being said the wound does appear to be somewhat moist with some evidence of maceration unfortunately. He has  no pain he does have neuropathy. He also has bilateral foot drop which she wears braces. This does appear to be however a pressure injury secondary to the AFO brace. The patient has been seen by podiatry since May 2021 unfortunately this is not healing hence the reason that he is coming to see [place] at this point to see if there is anything we can do to help in this regard. 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 9/15; the patient missed his appointment last week  due to illness with his wife who had to be hospitalized. We are using Hydrofera Blue to the small punched out area in the middle of the left plantar heel. 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 This is felt to be pressure from the brace itself. 03/05/2020 upon evaluation today patient appears to be doing well in regard to his wound on the heel. He has been tolerating the dressing changes without complication which is great news. Fortunately there is no signs of active infection which I am very happy with. 03/19/2020 upon evaluation today patient appears to be doing a little worse in regard to his heel based on what I am seeing at this point. There actually appears to be a pocket off to the side laterally that is problematic for him at this point. I am not to clear this way with some sharp debridement currently. The patient was in agreement with that plan. 10/20; patient I do not usually see. He has a small wound on the left plantar heel. Some depth. He is apparently completing antibiotics. He has bilateral foot drop and therefore is AFO braces limiting what we can do to offload this. 04/14/2020 upon evaluation today patient's heel actually appears to be doing fairly well. There is no signs of active infection at this time which is great news and overall I feel like he is managing nicely. The only issue he has is that the individual who has been helping change the dressings at home unfortunately has not been available for the past week or so. She is out of town. Nonetheless he has been going to urgent care to have the dressings changed as he is not able to do this on his own and states that his wife is unable to do it for him. 11/10; patient in today for a nurse visit but I was asked to look at him. I note that we are treating him for a small punched-out area on the left plantar heel. He arrived with a new superficial area close to this wound I am not really clear of  the exact etiology of this. The bigger problem was increased swelling warmth of the lower leg below the knee but intense erythema involving the entire anterior knee and medial thigh. His entire leg is warm and swollen. However it was not completely obvious to me that there was a big change in the lower leg however the nurses and the patient certainly thought there was. The patient stated that after his 2-week of trimethoprim sulfamethoxazole his legs looked very similar. His vital signs are stable he is afebrile but nevertheless if this is all infection this is extensive and failing outpatient management 04/28/2020 on evaluation today patient presents for follow-up after having been seen in the ER and admitted to the hospital due to cellulitis in the left knee. He has continued to have issues  at this point with regard to his knee it still very hot to touch upon evaluation today. His heel ulcer is actually measuring smaller than the last time I saw him but he still is having a lot of difficulty with dry and flaky skin over his feet in general. 05/05/2020 on evaluation today patient appears to be doing pretty well in regard to his heel ulcer. This is measuring a little smaller which is good news. Fortunately there is no signs of active infection at this time. No fevers, chills, nausea, vomiting, or diarrhea. He does have a new area on his right dorsal surface of his foot. With that being said this region we really do not know exactly what happened where this came from it appears to be somewhat abnormal in that there is a very well demarcated line that almost looks as if he could have had a burn or something of the sort that was partially protected 1 area but got burned over a different region. Either way I think we can probably get this healed fairly quickly. 05/14/2020 on evaluation today patient appears to be doing about the same in regard to his heel ulcer. We may need to try something different to see  if this will be beneficial for him. I am thinking of switching him to collagen. 05/21/2020 on evaluation today patient appears to be doing excellent in regard to his foot ulcer from the standpoint of the overall appearance with that being said were just having some trouble getting it to granulate in. I do believe he would benefit from potentially utilizing a skin substitute though at this point in time at the end of the year I think it may be best to hold off till next year if we need to do this but I am going to switch things out a little bit regular try endoform to see if this will be beneficial for him. 05/28/2020 upon evaluation today patient's wound actually appears to be doing fairly well is very healthy and I am pleased with the overall appearance in that regard. With that being said I do not see any signs of active infection at this time which is great news. No fevers, chills, nausea, vomiting, or diarrhea. Unfortunately the adhesive is sticking to his skin around the wound and causing some minor skin tearing. We need to avoid this if at all possible. 06/04/2020 upon evaluation today patient appears to be doing really about the same in regard to his wound. With that being said there was some callus around the edges of the wound that I did decide to go ahead and tried to clean away and the patient actually tolerated me doing that today. I described what was found in the physical exam/assessment portion of this note Holt, Marcus ([id number]) 06/20/2019 upon evaluation today patient's heel ulcer actually showed signs of having some callus buildup around the edges of his wound though the wound itself appears to be doing a little bit better in my opinion. There is no signs of active infection at this time. Overall the size of the wound is not significantly improved but again the quality does appear to be better. 07/02/2020 upon evaluation today patient's wound actually is showing signs of improvement  which is great news. I do feel like it is doing quite well. No fevers, chills, nausea, vomiting, or diarrhea. 07/09/2020 upon evaluation today patient's wound on his heel actually appears to be doing quite well which is great news. There does not appear to  be any signs of infection he does have a little bit of callus buildup again he we are limited in what we can do for the patient simply due to the fact that he unfortunately has to wear the braces for foot drop and this prevents appropriate and significant offloading. If we were able to do that I think this wound would already be healed nonetheless we are making some slight progress today. 07/16/2020 upon evaluation today patient appears to be doing about the same in regard to his heel ulcer unfortunately. There does not appear to be any signs of active infection at this time. No fevers, chills, nausea, vomiting, or diarrhea. With that being said I am concerned about the fact that not really making a lot of progress here the wound appears to be somewhat wet I do believe that he would benefit from utilization of a silver alginate dressing as opposed to the collagen to try to see if we can get this under control. There does not appear to be any evidence of active infection at this time. No fevers, chills, nausea, vomiting, or diarrhea. 07/23/2020 upon evaluation today patient actually appears to be doing quite well with regard to his wound currently. There does not appear to be any signs of worsening which is great news. Fortunately there is no evidence of active infection either which is also excellent. No fevers, chills, nausea, vomiting, or diarrhea. 07/30/2020 upon evaluation today patient appears to be doing about the same in regard to his heel ulcer. We need to try to get some of this dry cracked skin away with any use of the triamcinolone over this area that the patient has. I am also can recommend that we discontinue the use of a foam over this as  I feel like it is keeping it too moist I would really prefer an ABD pad here to try to absorb some of the moisture. I think the stretch net is a good way to keep it in place. 08/06/2020 upon evaluation today patient's heel actually appears to be doing okay today. Fortunately there is not appear to be any signs of active infection which is great news and I am pleased in that regard. I am going to perform sharp debridement clear away some of the callus as well as slough from the surface of the wound. The patient is in agreement with that plan. Objective Constitutional Well-nourished and well-hydrated in no acute distress. Vitals Time Taken: 3:12 PM, Height: 72 in, Weight: 200 lbs, BMI: 27.1, Temperature: 98.3 F, Pulse: 96 bpm, Respiratory Rate: 18 breaths/min, Blood Pressure: 136/79 mmHg. Respiratory normal breathing without difficulty. Psychiatric this patient is able to make decisions and demonstrates good insight into disease process. Alert and Oriented x 3. pleasant and cooperative. General Notes: Upon inspection patient's wound bed actually showed signs of some granulation and some slough buildup I did perform sharp debridement to clear away the necrotic debris on the surface of the wound he tolerated that today without complication also removed as much of the callus is possible and in general I feel like the patient is doing significantly better. Integumentary (Hair, Skin) Wound #1 status is Open. Original cause of wound was Trauma. The date acquired was: 10/21/2019. The wound has been in treatment 25 weeks. The wound is located on the Left,Plantar Calcaneus. The wound measures 0.3cm length x 0.4cm width x 0.2cm depth; 0.094cm^2 area and 0.019cm^3 volume. There is Fat Layer (Subcutaneous Tissue) exposed. There is no tunneling or undermining noted. There  is a small amount of serosanguineous drainage noted. The wound margin is flat and intact. There is small (1-33%) red granulation within the  wound bed. There is a small (1-33%) amount of necrotic tissue within the wound bed including Adherent Slough. Assessment Active Problems ICD-10 Pressure ulcer of left heel, stage 3 Non-pressure chronic ulcer of other part of right foot with fat layer exposed Holt, Marcus ([id number]) Idiopathic progressive neuropathy Foot drop, left foot Foot drop, right foot Cellulitis of left lower limb Procedures Wound #1 Pre-procedure diagnosis of Wound #1 is a Trauma, Other located on the Left,Plantar Calcaneus . There was a Excisional Skin/Subcutaneous Tissue Debridement with a total area of 0.12 sq cm performed by Marcus Sams., PA-C. With the following instrument(s): Curette to remove Viable and Non-Viable tissue/material. Material removed includes Callus, Subcutaneous Tissue, and Slough. A time out was conducted at 16:08, prior to the start of the procedure. A Minimum amount of bleeding was controlled with Pressure. The procedure was tolerated well. Post Debridement Measurements: 0.3cm length x 0.4cm width x 0.3cm depth; 0.028cm^3 volume. Character of Wound/Ulcer Post Debridement is stable. Post procedure Diagnosis Wound #1: Same as Pre-Procedure Plan Follow-up Appointments: Wound #1 Left,Plantar Calcaneus: Return Appointment in 1 week. Bathing/ Shower/ Hygiene: Wound #1 Left,Plantar Calcaneus: May shower; gently cleanse wound with antibacterial soap, rinse and pat dry prior to dressing wounds Edema Control - Lymphedema / Segmental Compressive Device / Other: Wound #1 Left,Plantar Calcaneus: Elevate, Exercise Daily and Avoid Standing for Long Periods of Time. WOUND #1: - Calcaneus Wound Laterality: Plantar, Left Cleanser: Normal Saline (Generic) 3 x Per Week/30 Days Discharge Instructions: Wash your hands with soap and water. Remove old dressing, discard into plastic bag and place into trash. Cleanse the wound with Normal Saline prior to applying a clean dressing using gauze sponges, not  tissues or cotton balls. Do not scrub or use excessive force. Pat dry using gauze sponges, not tissue or cotton balls. Peri-Wound Care: Triamcinolone Acetonide Cream, 0.1%, 15 (g) tube 3 x Per Week/30 Days Primary Dressing: Silvercel Small 2x2 (in/in) (Generic) 3 x Per Week/30 Days Discharge Instructions: Apply Silvercel Small 2x2 (in/in) as instructed Secondary Dressing: ABD Pad 5x9 (in/in) 3 x Per Week/30 Days Discharge Instructions: Cover heel with ABD pad Secured With: Stretch Net Size 10 (yds) 3 x Per Week/30 Days Discharge Instructions: To secure dressing in place. 1. Would recommend currently that we going continue with the silver alginate dressing I think that is probably still the best way to go at this point the patient is in agreement with that plan. 2. I am also can recommend that we continue using the triamcinolone around the heel and the leg to help with the skin. 3. I am also can recommend he continue to monitor for any signs of worsening or infection. Obviously if there is anything that occurs that he is concerned about ischial let me know soon as possible. We will see patient back for reevaluation in 1 week here in the clinic. If anything worsens or changes patient will contact our office for additional recommendations. Electronic Signature(s) Signed: 08/06/2020 5:28:43 PM By: Worthy Keeler PA-C Entered By: Worthy Holt on 08/06/2020 17:28:43 Binette, [name] ([id number]) -------------------------------------------------------------------------------- SuperBill Details Patient Name: [name] Date of Service: 08/06/2020 Medical Record Number: [id number] Patient Account Number: [id number] Date of Birth/Sex: 11/20/1935 (84 y.o. M) Treating RN: [name] Primary Care Provider: Nobie Holt Other Clinician: Referring Provider: Nobie Holt Treating Provider/Extender: [name] in Treatment: 25 Diagnosis Coding  ICD-10 Codes Code  Description [phone] Pressure ulcer of left heel, stage 3 L97.512 Non-pressure chronic ulcer of other part of right foot with fat layer exposed G60.3 Idiopathic progressive neuropathy M21.372 Foot drop, left foot M21.371 Foot drop, right foot L03.116 Cellulitis of left lower limb Facility Procedures CPT4 Code: [id number] Description: 81829 - DEB SUBQ TISSUE 20 SQ CM/< Modifier: Quantity: 1 CPT4 Code: Description: ICD-10 Diagnosis Description L89.623 Pressure ulcer of left heel, stage 3 Modifier: Quantity: Physician Procedures CPT4 Code: 9371696 Description: 11042 - WC PHYS SUBQ TISS 20 SQ CM Modifier: Quantity: 1 CPT4 Code: Description: ICD-10 Diagnosis Description L89.623 Pressure ulcer of left heel, stage 3 Modifier: Quantity: Electronic Signature(s) Signed: 08/06/2020 5:29:34 PM By: Worthy Keeler PA-C Previous Signature: 08/06/2020 4:52:24 PM Version By: [name], Marcus Breeding RN Entered By: Worthy Holt on 08/06/2020 17:29:33

## 2020-08-06 NOTE — Progress Notes (Addendum)
Marcus, Holt ([id number]) Visit Report for 08/06/2020 Arrival Information Details Patient Name: Marcus, Holt Date of Service: 08/06/2020 3:00 PM Medical Record Number: [id number] Patient Account Number: [id number] Date of Birth/Sex: [birth year] ([name]) Treating RN: Dolan Amen Primary Care Provider: [name] Other Clinician: Referring Provider: [name] Treating Provider/Extender: Skipper Cliche in Treatment: 25 Visit Information History Since Last Visit Added or deleted any medications: No Patient Arrived: [name] Had a fall or experienced change in No Arrival Time: 15:11 activities of daily living that may affect Accompanied By: self risk of falls: Transfer Assistance: None Hospitalized since last visit: No Patient Identification Verified: Yes Pain Present Now: No Secondary Verification Process Completed: Yes Patient Requires Transmission-Based Precautions: No Patient Has Alerts: No Electronic Signature(s) Signed: 08/07/2020 4:23:58 PM By: Jeanine Luz Entered By: Jeanine Luz on 08/06/2020 15:13:10 Holt, Marcus Pickerel ([id number]) -------------------------------------------------------------------------------- Clinic Level of Care Assessment Details Patient Name: [name] Date of Service: 08/06/2020 3:00 PM Medical Record Number: [id number] Patient Account Number: [id number] Date of Birth/Sex: 07-30-1935 (84 y.o. Male) Treating RN: Dolan Amen Primary Care Provider: [name] Other Clinician: Referring Provider: [name] Treating Provider/Extender: Skipper Cliche in Treatment: 25 Clinic Level of Care Assessment Items TOOL 1 Quantity Score []  - Use when EandM and Procedure is performed on INITIAL visit 0 ASSESSMENTS - Nursing Assessment / Reassessment []  - General Physical Exam (combine w/ comprehensive assessment (listed just below) when performed on new 0 pt. evals) []  - 0 Comprehensive Assessment (HX, ROS,  Risk Assessments, Wounds Hx, etc.) ASSESSMENTS - Wound and Skin Assessment / Reassessment []  - Dermatologic / Skin Assessment (not related to wound area) 0 ASSESSMENTS - Ostomy and/or Continence Assessment and Care []  - Incontinence Assessment and Management 0 []  - 0 Ostomy Care Assessment and Management (repouching, etc.) PROCESS - Coordination of Care []  - Simple Patient / Family Education for ongoing care 0 []  - 0 Complex (extensive) Patient / Family Education for ongoing care []  - 0 Staff obtains Programmer, systems, Records, Test Results / Process Orders []  - 0 Staff telephones HHA, Nursing Homes / Clarify orders / etc []  - 0 Routine Transfer to another Facility (non-emergent condition) []  - 0 Routine Hospital Admission (non-emergent condition) []  - 0 New Admissions / Biomedical engineer / Ordering NPWT, Apligraf, etc. []  - 0 Emergency Hospital Admission (emergent condition) PROCESS - Special Needs []  - Pediatric / Minor Patient Management 0 []  - 0 Isolation Patient Management []  - 0 Hearing / Language / Visual special needs []  - 0 Assessment of Community assistance (transportation, D/C planning, etc.) []  - 0 Additional assistance / Altered mentation []  - 0 Support Surface(s) Assessment (bed, cushion, seat, etc.) INTERVENTIONS - Miscellaneous []  - External ear exam 0 []  - 0 Patient Transfer (multiple staff / Civil Service fast streamer / Similar devices) []  - 0 Simple Staple / Suture removal (25 or less) []  - 0 Complex Staple / Suture removal (26 or more) []  - 0 Hypo/Hyperglycemic Management (do not check if billed separately) []  - 0 Ankle / Brachial Index (ABI) - do not check if billed separately Has the patient been seen at the hospital within the last three years: Yes Total Score: 0 Level Of Care: ____ [name] ([id number]) Electronic Signature(s) Signed: 08/06/2020 4:52:24 PM By: Georges Mouse, Minus Breeding RN Entered By: Georges Mouse, Minus Breeding on 08/06/2020 16:11:15 [name]  ([id number]) -------------------------------------------------------------------------------- Encounter Discharge Information Details Patient Name: [name] Date of Service: 08/06/2020 3:00 PM Medical Record Number: [id number] Patient Account Number: [id number] Date of Birth/Sex: 12-01-1935 (  [name]) Treating RN: Carlene Coria Primary Care Provider: [name] Other Clinician: Referring Provider: [name] Treating Provider/Extender: Skipper Cliche in Treatment: 25 Encounter Discharge Information Items Post Procedure Vitals Discharge Condition: Stable Temperature (F): 98.3 Ambulatory Status: Walker Pulse (bpm): 96 Discharge Destination: Home Respiratory Rate (breaths/min): 18 Transportation: Private Auto Blood Pressure (mmHg): 136/79 Accompanied By: self Schedule Follow-up Appointment: Yes Clinical Summary of Care: Patient Declined Electronic Signature(s) Signed: 08/11/2020 4:52:45 PM By: Carlene Coria RN Entered By: Carlene Coria on 08/06/2020 16:29:22 [name] ([id number]) -------------------------------------------------------------------------------- Lower Extremity Assessment Details Patient Name: [name] Date of Service: 08/06/2020 3:00 PM Medical Record Number: [id number] Patient Account Number: [id number] Date of Birth/Sex: 10/09/1935 (84 y.o. Male) Treating RN: Dolan Amen Primary Care Provider: [name] Other Clinician: Referring Provider: [name] Treating Provider/Extender: Skipper Cliche in Treatment: 25 Edema Assessment Assessed: [Left: No] [Right: No] [Left: Edema] [Right: :] Calf Left: Right: Point of Measurement: 32 cm From Medial Instep 41.2 cm Ankle Left: Right: Point of Measurement: 12 cm From Medial Instep 25.6 cm Vascular Assessment Pulses: Dorsalis Pedis Palpable: [Left:Yes] Electronic Signature(s) Signed: 08/06/2020 4:52:24 PM By: Georges Mouse, Minus Breeding RN Signed: 08/07/2020  4:23:58 PM By: Jeanine Luz Entered By: Jeanine Luz on 08/06/2020 15:24:00 Holt, Marcus Pickerel ([id number]) -------------------------------------------------------------------------------- Multi Wound Chart Details Patient Name: [name] Date of Service: 08/06/2020 3:00 PM Medical Record Number: [id number] Patient Account Number: [id number] Date of Birth/Sex: May 12, 1936 (84 y.o. Male) Treating RN: Dolan Amen Primary Care Provider: [name] Other Clinician: Referring Provider: [name] Treating Provider/Extender: Skipper Cliche in Treatment: 25 Vital Signs Height(in): 72 Pulse(bpm): 96 Weight(lbs): 200 Blood Pressure(mmHg): 136/79 Body Mass Index(BMI): 27 Temperature(F): 98.3 Respiratory Rate(breaths/min): 18 Photos: [N/A:N/A] Wound Location: Left, Plantar Calcaneus N/A N/A Wounding Event: Trauma N/A N/A Primary Etiology: Trauma, Other N/A N/A Secondary Etiology: Pressure Ulcer N/A N/A Comorbid History: Hypertension, Myocardial Infarction, N/A N/A Osteoarthritis, Neuropathy Date Acquired: 10/21/2019 N/A N/A Weeks of Treatment: 25 N/A N/A Wound Status: Open N/A N/A Measurements L x W x D (cm) 0.3x0.4x0.2 N/A N/A Area (cm) : 0.094 N/A N/A Volume (cm) : 0.019 N/A N/A % Reduction in Area: 52.00% N/A N/A % Reduction in Volume: 75.90% N/A N/A Classification: Full Thickness Without Exposed N/A N/A Support Structures Exudate Amount: Small N/A N/A Exudate Type: Serosanguineous N/A N/A Exudate Color: red, brown N/A N/A Wound Margin: Flat and Intact N/A N/A Granulation Amount: Small (1-33%) N/A N/A Granulation Quality: Red N/A N/A Necrotic Amount: Small (1-33%) N/A N/A Exposed Structures: Fat Layer (Subcutaneous Tissue): N/A N/A Yes Fascia: No Tendon: No Muscle: No Joint: No Bone: No Epithelialization: Small (1-33%) N/A N/A Treatment Notes Electronic Signature(s) Signed: 08/06/2020 4:52:24 PM By: Georges Mouse, Minus Breeding RN Entered By:  Georges Mouse, Minus Breeding on 08/06/2020 16:02:28 [name] ([id number]) -------------------------------------------------------------------------------- Multi-Disciplinary Care Plan Details Patient Name: [name] Date of Service: 08/06/2020 3:00 PM Medical Record Number: [id number] Patient Account Number: [id number] Date of Birth/Sex: 20-Jan-1936 ([name]) Treating RN: Dolan Amen Primary Care Provider: [name] Other Clinician: Referring Provider: [name] Treating Provider/Extender: Skipper Cliche in Treatment: 25 Active Inactive Electronic Signature(s) Signed: 08/28/2020 6:01:31 PM By: Gretta Cool, BSN, RN, CWS, Kim RN, BSN Signed: 09/18/2020 11:57:44 AM By: Charlett Nose RN Previous Signature: 08/06/2020 4:52:24 PM Version By: Georges Mouse, Minus Breeding RN Entered By: Gretta Cool, BSN, RN, CWS, Kim on 08/28/2020 18:01:30 Buntyn, Marcus Pickerel ([id number]) -------------------------------------------------------------------------------- Pain Assessment Details Patient Name: [name] Date of Service: 08/06/2020 3:00 PM Medical Record Number: [id number] Patient Account Number: [id number]  Date of Birth/Sex: 1936-04-29 ([name]) Treating RN: Dolan Amen Primary Care Provider: [name] Other Clinician: Referring Provider: [name] Treating Provider/Extender: Skipper Cliche in Treatment: 25 Active Problems Location of Pain Severity and Description of Pain Patient Has Paino No Site Locations Pain Management and Medication Current Pain Management: Electronic Signature(s) Signed: 08/06/2020 4:52:24 PM By: Georges Mouse, Minus Breeding RN Signed: 08/07/2020 4:23:58 PM By: Jeanine Luz Entered By: Jeanine Luz on 08/06/2020 15:14:00 [name] ([id number]) -------------------------------------------------------------------------------- Patient/Caregiver Education Details Patient Name: [name] Date of Service: 08/06/2020 3:00  PM Medical Record Number: [id number] Patient Account Number: [id number] Date of Birth/Gender: Nov 04, 1935 (84 y.o. Male) Treating RN: Dolan Amen Primary Care Physician: [name] Other Clinician: Referring Physician: [name] Treating Physician/Extender: Skipper Cliche in Treatment: 25 Education Assessment Education Provided To: Patient Education Topics Provided Wound Debridement: Methods: Explain/Verbal Responses: State content correctly Electronic Signature(s) Signed: 08/06/2020 4:52:24 PM By: Georges Mouse, Minus Breeding RN Entered By: Georges Mouse, Minus Breeding on 08/06/2020 16:11:34 Lucero, Marcus Pickerel ([id number]) -------------------------------------------------------------------------------- Wound Assessment Details Patient Name: [name] Date of Service: 08/06/2020 3:00 PM Medical Record Number: [id number] Patient Account Number: [id number] Date of Birth/Sex: 05-Oct-1935 (84 y.o. Male) Treating RN: Dolan Amen Primary Care Provider: [name] Other Clinician: Referring Provider: [name] Treating Provider/Extender: Skipper Cliche in Treatment: 25 Wound Status Wound Number: 1 Primary Etiology: Trauma, Other Wound Location: Left, Plantar Calcaneus Secondary Pressure Ulcer Etiology: Wounding Event: Trauma Wound Status: Open Date Acquired: 10/21/2019 Comorbid Hypertension, Myocardial Infarction, Osteoarthritis, Weeks Of Treatment: 25 History: Neuropathy Clustered Wound: No Photos Wound Measurements Length: (cm) 0.3 Width: (cm) 0.4 Depth: (cm) 0.2 Area: (cm) 0.094 Volume: (cm) 0.019 % Reduction in Area: 52% % Reduction in Volume: 75.9% Epithelialization: Small (1-33%) Tunneling: No Undermining: No Wound Description Classification: Full Thickness Without Exposed Support Structures Wound Margin: Flat and Intact Exudate Amount: Small Exudate Type: Serosanguineous Exudate Color: red, brown Foul Odor After Cleansing:  No Slough/Fibrino Yes Wound Bed Granulation Amount: Small (1-33%) Exposed Structure Granulation Quality: Red Fascia Exposed: No Necrotic Amount: Small (1-33%) Fat Layer (Subcutaneous Tissue) Exposed: Yes Necrotic Quality: Adherent Slough Tendon Exposed: No Muscle Exposed: No Joint Exposed: No Bone Exposed: No Electronic Signature(s) Signed: 08/06/2020 4:52:24 PM By: Georges Mouse, Minus Breeding RN Signed: 08/07/2020 4:23:58 PM By: Jeanine Luz Entered By: Jeanine Luz on 08/06/2020 15:21:39 Dhillon, Marcus Pickerel ([id number]) -------------------------------------------------------------------------------- Vitals Details Patient Name: [name] Date of Service: 08/06/2020 3:00 PM Medical Record Number: [id number] Patient Account Number: [id number] Date of Birth/Sex: [birth year] (84 y.o. Male) Treating RN: Dolan Amen Primary Care Provider: [name] Other Clinician: Referring Provider: [name] Treating Provider/Extender: Skipper Cliche in Treatment: 25 Vital Signs Time Taken: 15:12 Temperature (F): 98.3 Height (in): 72 Pulse (bpm): 96 Weight (lbs): 200 Respiratory Rate (breaths/min): 18 Body Mass Index (BMI): 27.1 Blood Pressure (mmHg): 136/79 Reference Range: 80 - 120 mg / dl Electronic Signature(s) Signed: 08/07/2020 4:23:58 PM By: Jeanine Luz Entered By: Jeanine Luz on 08/06/2020 15:13:52

## 2020-08-09 ENCOUNTER — Ambulatory Visit
Admission: EM | Admit: 2020-08-09 | Discharge: 2020-08-09 | Disposition: A | Discharge status: Home / Self Care | Payer: Medicare Other | Attending: Family Medicine | Admitting: Family Medicine

## 2020-08-09 ENCOUNTER — Inpatient Hospital Stay
Admission: EM | Admit: 2020-08-09 | Discharge: 2020-08-18 | DRG: 854 | Disposition: A | Discharge status: Skilled Nursing Facility | Payer: Medicare Other | Attending: Obstetrics and Gynecology | Admitting: Obstetrics and Gynecology

## 2020-08-09 ENCOUNTER — Observation Stay: Payer: Medicare Other

## 2020-08-09 ENCOUNTER — Emergency Department: Payer: Medicare Other

## 2020-08-09 ENCOUNTER — Other Ambulatory Visit: Payer: Self-pay

## 2020-08-09 ENCOUNTER — Encounter: Payer: Self-pay | Admitting: Internal Medicine

## 2020-08-09 DIAGNOSIS — Z20822 Contact with and (suspected) exposure to covid-19: Secondary | ICD-10-CM | POA: Diagnosis present

## 2020-08-09 DIAGNOSIS — R0781 Pleurodynia: Secondary | ICD-10-CM

## 2020-08-09 DIAGNOSIS — I5022 Chronic systolic (congestive) heart failure: Secondary | ICD-10-CM | POA: Diagnosis present

## 2020-08-09 DIAGNOSIS — Z8614 Personal history of Methicillin resistant Staphylococcus aureus infection: Secondary | ICD-10-CM

## 2020-08-09 DIAGNOSIS — E1151 Type 2 diabetes mellitus with diabetic peripheral angiopathy without gangrene: Secondary | ICD-10-CM | POA: Diagnosis present

## 2020-08-09 DIAGNOSIS — L0390 Cellulitis, unspecified: Secondary | ICD-10-CM

## 2020-08-09 DIAGNOSIS — L84 Corns and callosities: Secondary | ICD-10-CM | POA: Diagnosis present

## 2020-08-09 DIAGNOSIS — Z8719 Personal history of other diseases of the digestive system: Secondary | ICD-10-CM

## 2020-08-09 DIAGNOSIS — Y929 Unspecified place or not applicable: Secondary | ICD-10-CM

## 2020-08-09 DIAGNOSIS — E7800 Pure hypercholesterolemia, unspecified: Secondary | ICD-10-CM | POA: Diagnosis present

## 2020-08-09 DIAGNOSIS — L03119 Cellulitis of unspecified part of limb: Secondary | ICD-10-CM

## 2020-08-09 DIAGNOSIS — L02619 Cutaneous abscess of unspecified foot: Secondary | ICD-10-CM

## 2020-08-09 DIAGNOSIS — E785 Hyperlipidemia, unspecified: Secondary | ICD-10-CM | POA: Diagnosis present

## 2020-08-09 DIAGNOSIS — E871 Hypo-osmolality and hyponatremia: Secondary | ICD-10-CM | POA: Diagnosis present

## 2020-08-09 DIAGNOSIS — R7881 Bacteremia: Secondary | ICD-10-CM | POA: Diagnosis present

## 2020-08-09 DIAGNOSIS — L03116 Cellulitis of left lower limb: Secondary | ICD-10-CM | POA: Diagnosis not present

## 2020-08-09 DIAGNOSIS — Z79899 Other long term (current) drug therapy: Secondary | ICD-10-CM

## 2020-08-09 DIAGNOSIS — K219 Gastro-esophageal reflux disease without esophagitis: Secondary | ICD-10-CM | POA: Diagnosis present

## 2020-08-09 DIAGNOSIS — G609 Hereditary and idiopathic neuropathy, unspecified: Secondary | ICD-10-CM | POA: Diagnosis present

## 2020-08-09 DIAGNOSIS — I959 Hypotension, unspecified: Secondary | ICD-10-CM | POA: Diagnosis present

## 2020-08-09 DIAGNOSIS — M86472 Chronic osteomyelitis with draining sinus, left ankle and foot: Secondary | ICD-10-CM | POA: Diagnosis not present

## 2020-08-09 DIAGNOSIS — I110 Hypertensive heart disease with heart failure: Secondary | ICD-10-CM | POA: Diagnosis present

## 2020-08-09 DIAGNOSIS — Z888 Allergy status to other drugs, medicaments and biological substances status: Secondary | ICD-10-CM

## 2020-08-09 DIAGNOSIS — Z8249 Family history of ischemic heart disease and other diseases of the circulatory system: Secondary | ICD-10-CM

## 2020-08-09 DIAGNOSIS — T502X5A Adverse effect of carbonic-anhydrase inhibitors, benzothiadiazides and other diuretics, initial encounter: Secondary | ICD-10-CM | POA: Diagnosis present

## 2020-08-09 DIAGNOSIS — L97429 Non-pressure chronic ulcer of left heel and midfoot with unspecified severity: Secondary | ICD-10-CM | POA: Diagnosis present

## 2020-08-09 DIAGNOSIS — A4102 Sepsis due to Methicillin resistant Staphylococcus aureus: Secondary | ICD-10-CM | POA: Diagnosis present

## 2020-08-09 DIAGNOSIS — Z89412 Acquired absence of left great toe: Secondary | ICD-10-CM

## 2020-08-09 DIAGNOSIS — I2510 Atherosclerotic heart disease of native coronary artery without angina pectoris: Secondary | ICD-10-CM | POA: Diagnosis present

## 2020-08-09 DIAGNOSIS — Z85828 Personal history of other malignant neoplasm of skin: Secondary | ICD-10-CM

## 2020-08-09 DIAGNOSIS — Z89419 Acquired absence of unspecified great toe: Secondary | ICD-10-CM

## 2020-08-09 DIAGNOSIS — E782 Mixed hyperlipidemia: Secondary | ICD-10-CM

## 2020-08-09 DIAGNOSIS — G4700 Insomnia, unspecified: Secondary | ICD-10-CM | POA: Diagnosis present

## 2020-08-09 DIAGNOSIS — Z91040 Latex allergy status: Secondary | ICD-10-CM

## 2020-08-09 DIAGNOSIS — A419 Sepsis, unspecified organism: Secondary | ICD-10-CM | POA: Diagnosis present

## 2020-08-09 DIAGNOSIS — I700 Atherosclerosis of aorta: Secondary | ICD-10-CM | POA: Diagnosis present

## 2020-08-09 DIAGNOSIS — E11621 Type 2 diabetes mellitus with foot ulcer: Secondary | ICD-10-CM | POA: Diagnosis present

## 2020-08-09 DIAGNOSIS — Z91013 Allergy to seafood: Secondary | ICD-10-CM

## 2020-08-09 DIAGNOSIS — E876 Hypokalemia: Secondary | ICD-10-CM | POA: Diagnosis not present

## 2020-08-09 DIAGNOSIS — B9562 Methicillin resistant Staphylococcus aureus infection as the cause of diseases classified elsewhere: Secondary | ICD-10-CM | POA: Diagnosis present

## 2020-08-09 DIAGNOSIS — I10 Essential (primary) hypertension: Secondary | ICD-10-CM

## 2020-08-09 DIAGNOSIS — I252 Old myocardial infarction: Secondary | ICD-10-CM

## 2020-08-09 DIAGNOSIS — L02612 Cutaneous abscess of left foot: Secondary | ICD-10-CM | POA: Diagnosis present

## 2020-08-09 DIAGNOSIS — Z951 Presence of aortocoronary bypass graft: Secondary | ICD-10-CM

## 2020-08-09 DIAGNOSIS — Z8546 Personal history of malignant neoplasm of prostate: Secondary | ICD-10-CM

## 2020-08-09 LAB — URINALYSIS, COMPLETE (UACMP) WITH MICROSCOPIC
Bacteria, UA: NONE SEEN
Bilirubin Urine: NEGATIVE
Glucose, UA: NEGATIVE mg/dL
Ketones, ur: NEGATIVE mg/dL
Leukocytes,Ua: NEGATIVE
Nitrite: NEGATIVE
Protein, ur: NEGATIVE mg/dL
Specific Gravity, Urine: 1.005 (ref 1.005–1.030)
Squamous Epithelial / HPF: NONE SEEN (ref 0–5)
pH: 6 (ref 5.0–8.0)

## 2020-08-09 LAB — CBC WITH DIFFERENTIAL/PLATELET
Abs Immature Granulocytes: 0.1 10*3/uL — ABNORMAL HIGH (ref 0.00–0.07)
Basophils Absolute: 0 10*3/uL (ref 0.0–0.1)
Basophils Relative: 0 %
Eosinophils Absolute: 0 10*3/uL (ref 0.0–0.5)
Eosinophils Relative: 0 %
HCT: 35.2 % — ABNORMAL LOW (ref 39.0–52.0)
Hemoglobin: 12.7 g/dL — ABNORMAL LOW (ref 13.0–17.0)
Immature Granulocytes: 1 %
Lymphocytes Relative: 3 %
Lymphs Abs: 0.4 10*3/uL — ABNORMAL LOW (ref 0.7–4.0)
MCH: 36.7 pg — ABNORMAL HIGH (ref 26.0–34.0)
MCHC: 36.1 g/dL — ABNORMAL HIGH (ref 30.0–36.0)
MCV: 101.7 fL — ABNORMAL HIGH (ref 80.0–100.0)
Monocytes Absolute: 0.7 10*3/uL (ref 0.1–1.0)
Monocytes Relative: 5 %
Neutro Abs: 13.3 10*3/uL — ABNORMAL HIGH (ref 1.7–7.7)
Neutrophils Relative %: 91 %
Platelets: 263 10*3/uL (ref 150–400)
RBC: 3.46 MIL/uL — ABNORMAL LOW (ref 4.22–5.81)
RDW: 12.2 % (ref 11.5–15.5)
WBC: 14.6 10*3/uL — ABNORMAL HIGH (ref 4.0–10.5)
nRBC: 0 % (ref 0.0–0.2)

## 2020-08-09 LAB — SEDIMENTATION RATE: Sed Rate: 71 mm/hr — ABNORMAL HIGH (ref 0–20)

## 2020-08-09 LAB — BASIC METABOLIC PANEL
Anion gap: 10 (ref 5–15)
BUN: 18 mg/dL (ref 8–23)
CO2: 25 mmol/L (ref 22–32)
Calcium: 9.2 mg/dL (ref 8.9–10.3)
Chloride: 91 mmol/L — ABNORMAL LOW (ref 98–111)
Creatinine, Ser: 1.02 mg/dL (ref 0.61–1.24)
GFR, Estimated: >60 mL/min (ref >60)
Glucose, Bld: 135 mg/dL — ABNORMAL HIGH (ref 70–99)
Potassium: 3.3 mmol/L — ABNORMAL LOW (ref 3.5–5.1)
Sodium: 126 mmol/L — ABNORMAL LOW (ref 135–145)

## 2020-08-09 LAB — RESP PANEL BY RT-PCR (FLU A&B, COVID) ARPGX2
Influenza A by PCR: NEGATIVE
Influenza B by PCR: NEGATIVE
SARS Coronavirus 2 by RT PCR: NEGATIVE

## 2020-08-09 LAB — LACTIC ACID, PLASMA
Lactic Acid, Venous: 1.1 mmol/L (ref 0.5–1.9)
Lactic Acid, Venous: 1 mmol/L (ref 0.5–1.9)

## 2020-08-09 LAB — COMPREHENSIVE METABOLIC PANEL
ALT: 13 U/L (ref 0–44)
AST: 17 U/L (ref 15–41)
Albumin: 4.2 g/dL (ref 3.5–5.0)
Alkaline Phosphatase: 61 U/L (ref 38–126)
Anion gap: 13 (ref 5–15)
BUN: 18 mg/dL (ref 8–23)
CO2: 26 mmol/L (ref 22–32)
Calcium: 9.7 mg/dL (ref 8.9–10.3)
Chloride: 86 mmol/L — ABNORMAL LOW (ref 98–111)
Creatinine, Ser: 0.83 mg/dL (ref 0.61–1.24)
GFR, Estimated: >60 mL/min (ref >60)
Glucose, Bld: 122 mg/dL — ABNORMAL HIGH (ref 70–99)
Potassium: 2.7 mmol/L — CL (ref 3.5–5.1)
Sodium: 125 mmol/L — ABNORMAL LOW (ref 135–145)
Total Bilirubin: 1.1 mg/dL (ref 0.3–1.2)
Total Protein: 8.4 g/dL — ABNORMAL HIGH (ref 6.5–8.1)

## 2020-08-09 LAB — TSH: TSH: 0.27 u[IU]/mL — ABNORMAL LOW (ref 0.350–4.500)

## 2020-08-09 LAB — MAGNESIUM: Magnesium: 1.5 mg/dL — ABNORMAL LOW (ref 1.7–2.4)

## 2020-08-09 MED ORDER — VITAMIN-B COMPLEX PO TABS: ORAL_TABLET | Freq: Every day | ORAL | Status: DC

## 2020-08-09 MED ORDER — VANCOMYCIN HCL 1000 MG/200ML IV SOLN
1000.0000 mg | Freq: Two times a day (BID) | INTRAVENOUS | Status: DC
  Administered 2020-08-10 – 2020-08-12 (×4): 1000 mg via INTRAVENOUS
  Filled 2020-08-09 (×7): qty 200

## 2020-08-09 MED ORDER — ACETAMINOPHEN 325 MG RE SUPP: 325.0000 mg | Freq: Four times a day (QID) | RECTAL | Status: DC | PRN

## 2020-08-09 MED ORDER — CALCIUM CARB-CHOLECALCIFEROL 500-100 MG-UNIT PO CHEW: 500.0000 mg | CHEWABLE_TABLET | Freq: Every day | ORAL | Status: DC

## 2020-08-09 MED ORDER — LACTATED RINGERS IV SOLN: INTRAVENOUS | Status: AC

## 2020-08-09 MED ORDER — MELATONIN 5 MG PO TABS
5.0000 mg | ORAL_TABLET | Freq: Every day | ORAL | Status: DC
  Administered 2020-08-09 – 2020-08-17 (×9): 5 mg via ORAL
  Filled 2020-08-09 (×9): qty 1

## 2020-08-09 MED ORDER — ACETAMINOPHEN 500 MG PO TABS
1000.0000 mg | ORAL_TABLET | Freq: Four times a day (QID) | ORAL | Status: AC | PRN
  Administered 2020-08-09 – 2020-08-10 (×2): 1000 mg via ORAL
  Filled 2020-08-09 (×2): qty 2

## 2020-08-09 MED ORDER — B COMPLEX-C PO TABS
1.0000 | ORAL_TABLET | Freq: Every day | ORAL | Status: DC
  Administered 2020-08-09 – 2020-08-18 (×10): 1 via ORAL
  Filled 2020-08-09 (×11): qty 1

## 2020-08-09 MED ORDER — B COMPLEX-C PO TABS: 1.0000 | ORAL_TABLET | Freq: Every day | ORAL | Status: DC

## 2020-08-09 MED ORDER — POTASSIUM CHLORIDE CRYS ER 20 MEQ PO TBCR
40.0000 meq | EXTENDED_RELEASE_TABLET | Freq: Once | ORAL | Status: AC
  Administered 2020-08-09: 40 meq via ORAL
  Filled 2020-08-09: qty 2

## 2020-08-09 MED ORDER — SODIUM CHLORIDE 0.9 % IV SOLN: 2.0000 g | Freq: Three times a day (TID) | INTRAVENOUS | Status: DC

## 2020-08-09 MED ORDER — PANTOPRAZOLE SODIUM 40 MG PO TBEC
40.0000 mg | DELAYED_RELEASE_TABLET | Freq: Every day | ORAL | Status: DC
  Administered 2020-08-10 – 2020-08-18 (×9): 40 mg via ORAL
  Filled 2020-08-09 (×9): qty 1

## 2020-08-09 MED ORDER — LACTATED RINGERS IV BOLUS (SEPSIS)
1000.0000 mL | Freq: Once | INTRAVENOUS | Status: AC
  Administered 2020-08-09: 1000 mL via INTRAVENOUS

## 2020-08-09 MED ORDER — ACETAMINOPHEN 650 MG RE SUPP: 650.0000 mg | Freq: Four times a day (QID) | RECTAL | Status: AC | PRN

## 2020-08-09 MED ORDER — HYDRALAZINE HCL 25 MG PO TABS: 25.0000 mg | ORAL_TABLET | Freq: Three times a day (TID) | ORAL | Status: AC | PRN

## 2020-08-09 MED ORDER — ACETAMINOPHEN 500 MG PO TABS
ORAL_TABLET | ORAL | Status: AC
  Administered 2020-08-09: 1000 mg via ORAL
  Filled 2020-08-09: qty 2

## 2020-08-09 MED ORDER — SODIUM CHLORIDE 0.9 % IV SOLN
2.0000 g | Freq: Three times a day (TID) | INTRAVENOUS | Status: DC
  Administered 2020-08-10 – 2020-08-12 (×9): 2 g via INTRAVENOUS
  Filled 2020-08-09 (×14): qty 2

## 2020-08-09 MED ORDER — ONDANSETRON HCL 4 MG/2ML IJ SOLN: 4.0000 mg | Freq: Four times a day (QID) | INTRAMUSCULAR | Status: DC | PRN

## 2020-08-09 MED ORDER — FENTANYL CITRATE (PF) 100 MCG/2ML IJ SOLN
INTRAMUSCULAR | Status: AC
  Administered 2020-08-09: 50 ug via INTRAVENOUS
  Filled 2020-08-09: qty 2

## 2020-08-09 MED ORDER — ONDANSETRON HCL 4 MG PO TABS: 4.0000 mg | ORAL_TABLET | Freq: Four times a day (QID) | ORAL | Status: DC | PRN

## 2020-08-09 MED ORDER — GADOBUTROL 1 MMOL/ML IV SOLN
9.0000 mL | Freq: Once | INTRAVENOUS | Status: AC | PRN
  Administered 2020-08-09: 9 mL via INTRAVENOUS

## 2020-08-09 MED ORDER — MAGNESIUM SULFATE 2 GM/50ML IV SOLN
2.0000 g | Freq: Once | INTRAVENOUS | Status: AC
  Administered 2020-08-09: 2 g via INTRAVENOUS
  Filled 2020-08-09: qty 50

## 2020-08-09 MED ORDER — VANCOMYCIN HCL IN DEXTROSE 1-5 GM/200ML-% IV SOLN
1000.0000 mg | Freq: Once | INTRAVENOUS | Status: AC
  Administered 2020-08-09: 1000 mg via INTRAVENOUS
  Filled 2020-08-09: qty 200

## 2020-08-09 MED ORDER — TRAMADOL HCL 50 MG PO TABS
50.0000 mg | ORAL_TABLET | Freq: Four times a day (QID) | ORAL | Status: DC | PRN
  Administered 2020-08-09 – 2020-08-17 (×12): 50 mg via ORAL
  Filled 2020-08-09 (×12): qty 1

## 2020-08-09 MED ORDER — POTASSIUM CHLORIDE 10 MEQ/100ML IV SOLN
10.0000 meq | INTRAVENOUS | Status: AC
  Administered 2020-08-09 (×2): 10 meq via INTRAVENOUS
  Filled 2020-08-09: qty 100

## 2020-08-09 MED ORDER — FOLIC ACID 1 MG PO TABS
1.0000 mg | ORAL_TABLET | Freq: Every day | ORAL | Status: DC
  Administered 2020-08-09 – 2020-08-18 (×10): 1 mg via ORAL
  Filled 2020-08-09 (×10): qty 1

## 2020-08-09 MED ORDER — VITAMIN B-12 1000 MCG PO TABS
1000.0000 ug | ORAL_TABLET | Freq: Every day | ORAL | Status: DC
  Administered 2020-08-09 – 2020-08-18 (×10): 1000 ug via ORAL
  Filled 2020-08-09 (×13): qty 1

## 2020-08-09 MED ORDER — ACETAMINOPHEN 500 MG PO TABS: 1000.0000 mg | ORAL_TABLET | Freq: Once | ORAL | Status: AC

## 2020-08-09 MED ORDER — PRIMIDONE 50 MG PO TABS
50.0000 mg | ORAL_TABLET | Freq: Three times a day (TID) | ORAL | Status: DC
  Administered 2020-08-09 – 2020-08-18 (×26): 50 mg via ORAL
  Filled 2020-08-09 (×33): qty 1

## 2020-08-09 MED ORDER — DIPHENHYDRAMINE HCL 25 MG PO CAPS
25.0000 mg | ORAL_CAPSULE | Freq: Every evening | ORAL | Status: DC | PRN
  Administered 2020-08-09 – 2020-08-16 (×3): 25 mg via ORAL
  Filled 2020-08-09 (×3): qty 1

## 2020-08-09 MED ORDER — SODIUM CHLORIDE 0.9 % IV SOLN
2.0000 g | Freq: Once | INTRAVENOUS | Status: AC
  Administered 2020-08-09: 2 g via INTRAVENOUS
  Filled 2020-08-09: qty 2

## 2020-08-09 MED ORDER — AMLODIPINE BESYLATE 5 MG PO TABS
2.5000 mg | ORAL_TABLET | Freq: Every day | ORAL | Status: DC
  Administered 2020-08-10 – 2020-08-18 (×9): 2.5 mg via ORAL
  Filled 2020-08-09 (×9): qty 1

## 2020-08-09 MED ORDER — PRAVASTATIN SODIUM 20 MG PO TABS
80.0000 mg | ORAL_TABLET | Freq: Every day | ORAL | Status: DC
  Administered 2020-08-09 – 2020-08-17 (×9): 80 mg via ORAL
  Filled 2020-08-09 (×4): qty 2
  Filled 2020-08-09: qty 4
  Filled 2020-08-09 (×6): qty 2

## 2020-08-09 MED ORDER — FENTANYL CITRATE (PF) 100 MCG/2ML IJ SOLN: 50.0000 ug | Freq: Once | INTRAMUSCULAR | Status: AC

## 2020-08-09 MED ORDER — ACETAMINOPHEN 325 MG PO TABS: 325.0000 mg | ORAL_TABLET | Freq: Four times a day (QID) | ORAL | Status: DC | PRN

## 2020-08-09 MED ORDER — BIOTIN 1000 MCG PO TABS: 1000.0000 ug | ORAL_TABLET | Freq: Every day | ORAL | Status: DC

## 2020-08-09 MED ORDER — METRONIDAZOLE IN NACL 5-0.79 MG/ML-% IV SOLN
500.0000 mg | Freq: Once | INTRAVENOUS | Status: AC
  Administered 2020-08-09: 500 mg via INTRAVENOUS
  Filled 2020-08-09: qty 100

## 2020-08-09 MED ORDER — ONDANSETRON HCL 4 MG/2ML IJ SOLN: 4.0000 mg | Freq: Once | INTRAMUSCULAR | Status: AC

## 2020-08-09 MED ORDER — VITAMIN B-12 1000 MCG PO TABS: 1000.0000 ug | ORAL_TABLET | Freq: Every day | ORAL | Status: DC

## 2020-08-09 MED ORDER — CALCIUM CITRATE-VITAMIN D 500-500 MG-UNIT PO CHEW
1.0000 | CHEWABLE_TABLET | Freq: Every day | ORAL | Status: DC
  Administered 2020-08-10 – 2020-08-18 (×9): 1 via ORAL
  Filled 2020-08-09 (×9): qty 1

## 2020-08-09 MED ORDER — ONDANSETRON HCL 4 MG/2ML IJ SOLN
INTRAMUSCULAR | Status: AC
  Administered 2020-08-09: 4 mg via INTRAVENOUS
  Filled 2020-08-09: qty 2

## 2020-08-09 MED ORDER — LACTATED RINGERS IV BOLUS (SEPSIS): 1000.0000 mL | Freq: Once | INTRAVENOUS | Status: DC

## 2020-08-09 MED ORDER — ENOXAPARIN SODIUM 40 MG/0.4ML ~~LOC~~ SOLN
40.0000 mg | SUBCUTANEOUS | Status: DC
  Administered 2020-08-09 – 2020-08-17 (×9): 40 mg via SUBCUTANEOUS
  Filled 2020-08-09 (×9): qty 0.4

## 2020-08-09 MED ORDER — ASPIRIN EC 81 MG PO TBEC
81.0000 mg | DELAYED_RELEASE_TABLET | Freq: Every day | ORAL | Status: DC
  Administered 2020-08-09 – 2020-08-18 (×10): 81 mg via ORAL
  Filled 2020-08-09 (×11): qty 1

## 2020-08-09 MED ORDER — VANCOMYCIN HCL IN DEXTROSE 1-5 GM/200ML-% IV SOLN: 1000.0000 mg | Freq: Once | INTRAVENOUS | Status: DC

## 2020-08-09 MED ORDER — FOLIC ACID 1 MG PO TABS: 1.0000 mg | ORAL_TABLET | Freq: Every day | ORAL | Status: DC

## 2020-08-09 MED ORDER — GABAPENTIN 600 MG PO TABS
1200.0000 mg | ORAL_TABLET | Freq: Two times a day (BID) | ORAL | Status: DC
  Administered 2020-08-09 – 2020-08-18 (×18): 1200 mg via ORAL
  Filled 2020-08-09 (×18): qty 2

## 2020-08-09 NOTE — Progress Notes (Signed)
elink monitoring for sepsis protacol

## 2020-08-09 NOTE — ED Triage Notes (Signed)
Patient presents to ER from [facility]. Patient reports left leg redness and swelling since 08/06/20. Patient A&OX3.

## 2020-08-09 NOTE — ED Notes (Signed)
Patient transported to MRI then to room 248

## 2020-08-09 NOTE — ED Notes (Signed)
Dr. Funke at bedside. 

## 2020-08-09 NOTE — Progress Notes (Signed)
Pharmacy Antibiotic Note  [name] is a 85 y.o. male admitted on 08/09/2020 with sepsis and wound infection of L foot. Pharmacy has been consulted for vancomycin and cefepime dosing.  Patient afebrile on prn APAP, WBC 14.6, LA 1.1. Blood cultures ordered. 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83  --   LATICACIDVEN 1.1  --  1.0    Estimated Creatinine Clearance: 75.3 mL/min (by C-G formula based on SCr of 0.83 mg/dL).    Allergies  Allergen Reactions  . Codeine Nausea And Vomiting  . Indocin [Indomethacin] Other (See Comments)  . Latex Other (See Comments)    Blisters  . Mupirocin     Blisters  . Plavix [Clopidogrel Bisulfate]     GI intolerance, n/v  . Polysporin [Bacitracin-Polymyxin B]     Blisters  . Shellfish Allergy   . Tape   . Neosporin [Neomycin-Bacitracin Zn-Polymyx] Rash  . Other Nausea And Vomiting    mussels    Antimicrobials this admission: vancomycin 2/27> Cefepime 2/27 Metronidazole 2/27>  Microbiology results: 2/27 BCx:    Brendolyn Patty, PharmD Clinical Pharmacist  08/09/2020   7:09 PM

## 2020-08-09 NOTE — ED Provider Notes (Signed)
[facility] Emergency Department Provider Note  ____________________________________________   Event Date/Time   First MD Initiated Contact with Patient 08/09/20 1549     (approximate)  I have reviewed the triage vital signs and the nursing notes.   HISTORY  Chief Complaint Leg Swelling    HPI [name] is a 85 y.o. 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  Patient was seen in urgent care.  Patient states that he had left leg redness that is been getting worse over the past 4 days.  He states that he is not able to feel a lot of pain secondary to his neuropathy.  He states that he had a wound on the bottom of his heel but is unsure how he got there.  The redness has gotten worse, nothing makes it better, nothing makes it worse.  He states that he has not been on any antibiotics.  Patient noted to have a temperature of 37.2 and was tachycardic.  Patient was sent over here for IV antibiotics and hospital admission due to concern for sepsis.  Patient is not on any blood thinners.  On review of records patient has had osteomyelitis in his toe on that foot as well.  He has had positive MRSA cultures.          Past Medical History:  Diagnosis Date  . Arthritis   . Benign essential tremor   . Fracture, femur ([place]) 2012  . GERD (gastroesophageal reflux disease)   . H/O echocardiogram    05/2013  . Hx of colonoscopy    04/22/2013  . Hypercholesterolemia   . Hypertension   . Myocardial infarction ([place])    1987  . Polyneuropathy   . Prostate cancer ([place]) 2004  . Superficial hematoma     Patient Active Problem List   Diagnosis Date Noted  . Sepsis ([place]) 04/24/2020  . Cellulitis of knee, left 04/22/2020  . Hypokalemia 04/22/2020  . Osteoarthritis of multiple joints 12/15/2017  . Hyponatremia 03/28/2017  . Abnormality of gait and mobility 01/11/2017  . Cloudy vision 01/11/2017  . Foot drop, bilateral  01/11/2017  . Amputee, great toe, left ([place]) 01/11/2017  . 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 Macrocytic anemia 09/22/2015  . Moderate tricuspid insufficiency 04/21/2015  . History of prostate cancer 04/16/2015  . Idiopathic peripheral neuropathy 04/16/2015  . Essential hypertension 04/16/2015  . GERD (gastroesophageal reflux disease) 04/16/2015  . Arthritis 04/16/2015  . Bilateral lower extremity edema 04/16/2015  . Benign essential tremor 10/30/2014  . Hyperlipidemia, mixed 10/27/2014  . Chronic systolic CHF (congestive heart failure), NYHA class 2 ([place]) 04/23/2014  . Mitral insufficiency 11/05/2013  . CAD (coronary artery disease) 11/01/2013  . Basal cell carcinoma of skin of other parts of face 03/14/2013  . Ankle sprain 09/09/2011  . History of fibula fracture 09/09/2011    Past Surgical History:  Procedure Laterality Date  . BASAL CELL CARCINOMA EXCISION    . COLONOSCOPY  05/01/2009   Positive for colonic polyps  . COLONOSCOPY WITH PROPOFOL N/A 02/24/2016   Procedure: COLONOSCOPY WITH PROPOFOL;  Surgeon: Christene Lye, MD;  Location: [facility] ENDOSCOPY;  Service: Endoscopy;  Laterality: N/A;  . CORONARY ARTERY BYPASS GRAFT  1987   X2  . HERNIA REPAIR  1990  . PROSTATE SURGERY  2004    Prior to Admission medications   Medication Sig Start Date End Date Taking? Authorizing Provider  Alpha Lipoic  Acid 200 MG CAPS Take 2 capsules by mouth daily.    [provider]  amLODipine (NORVASC) 2.5 MG tablet TAKE 1 TABLET BY MOUTH  DAILY Patient taking differently: Take 2.5 mg by mouth daily. 02/23/20   Karamalegos, Devonne Doughty, DO  Apoaequorin (PREVAGEN PO) Take by mouth.    [provider]  aspirin 81 MG tablet Take 81 mg by mouth daily.     [provider]  azelastine (ASTELIN) 0.1 % nasal spray Place 1 spray into both nostrils 2 (two) times daily. Use in each nostril as directed 12/23/19   [name], DO  B  Complex Vitamins (VITAMIN-B COMPLEX PO) Take 1 tablet by mouth daily. Reported on 10/06/2015    [provider]  Biotin 1000 MCG tablet Take 1,000 mcg by mouth daily.    [provider]  Calcium Carb-Cholecalciferol 500-100 MG-UNIT CHEW Chew 500 mg by mouth daily.    [provider]  cyanocobalamin 1000 MCG tablet Take 1,000 mcg by mouth daily.    [provider]  folic acid (FOLVITE) 1 MG tablet Take 1 mg by mouth daily.    [provider]  furosemide (LASIX) 40 MG tablet Take 1 tablet (40 mg total) by mouth daily. 06/24/20   Karamalegos, Devonne Doughty, DO  gabapentin (NEURONTIN) 600 MG tablet TAKE 2 TABLETS BY MOUTH  TWICE DAILY Patient taking differently: Take 1,200 mg by mouth 2 (two) times daily. 02/23/20   Karamalegos, Devonne Doughty, DO  Glucosamine Sulfate 1000 MG CAPS Take 1 capsule by mouth daily.    [provider]  hydrochlorothiazide (HYDRODIURIL) 25 MG tablet TAKE 1 TABLET BY MOUTH  TWICE DAILY 07/05/20   Parks Ranger, Devonne Doughty, DO  Lecithin 400 MG CAPS Take 400 mg by mouth daily.    [provider]  linezolid (ZYVOX) 600 MG tablet Take 600 mg by mouth 2 (two) times daily.    [provider]  losartan (COZAAR) 50 MG tablet TAKE 1 TABLET BY MOUTH  DAILY Patient taking differently: Take 50 mg by mouth daily. 02/24/20   Karamalegos, Devonne Doughty, DO  Melatonin 3 MG CAPS Take 3 mg by mouth at bedtime.    [provider]  Omega 3 1000 MG CAPS Take 1,000 mg by mouth daily.    [provider]  omeprazole (PRILOSEC) 20 MG capsule TAKE 1 CAPSULE BY MOUTH  TWICE DAILY BEFORE MEALS Patient taking differently: Take 20 mg by mouth 2 (two) times daily before a meal. 02/23/20   Karamalegos, Devonne Doughty, DO  potassium chloride SA (KLOR-CON) 20 MEQ tablet Take 1 tablet (20 mEq total) by mouth 2 (two) times daily. 12/19/19   Karamalegos, Devonne Doughty, DO  pravastatin (PRAVACHOL) 80 MG tablet TAKE 1 TABLET BY MOUTH   DAILY Patient taking differently: Take 80 mg by mouth daily. 02/23/20   Karamalegos, Devonne Doughty, DO  primidone (MYSOLINE) 50 MG tablet TAKE 1 TABLET BY MOUTH 3  TIMES DAILY Patient taking differently: Take 50 mg by mouth 3 (three) times daily. 02/24/20   Karamalegos, Devonne Doughty, DO  Selenium 200 MCG CAPS Take 200 mcg by mouth daily.    [provider]  tretinoin (RETIN-A) 0.05 % cream Apply 1 application topically at bedtime.    [provider]    Allergies Codeine, Indocin [indomethacin], Latex, Mupirocin, Plavix [clopidogrel bisulfate], Polysporin [bacitracin-polymyxin b], Shellfish allergy, Tape, Neosporin [neomycin-bacitracin zn-polymyx], and Other  Family History  Problem Relation Age of Onset  . Heart disease Mother   .  Congestive Heart Failure Father     Social History Social History   Tobacco Use  . Smoking status: Never Smoker  . Smokeless tobacco: Never Used  Vaping Use  . Vaping Use: Never used  Substance Use Topics  . Alcohol use: Yes    Alcohol/week: 7.0 standard drinks    Types: 7 Glasses of wine per week    Comment: wine in the evening with dinner  . Drug use: No      Review of Systems Constitutional: Positive fever Eyes: No visual changes. ENT: No sore throat. Cardiovascular: Denies chest pain. Respiratory: Denies shortness of breath. Gastrointestinal: No abdominal pain.  No nausea, no vomiting.  No diarrhea.  No constipation. Genitourinary: Negative for dysuria. Musculoskeletal: Negative for back pain.  Left leg swelling and redness Skin: Negative for rash. Neurological: Negative for headaches, focal weakness or numbness. All other ROS negative ____________________________________________   PHYSICAL EXAM:  VITAL SIGNS: ED Triage Vitals  Enc Vitals Group     BP --      Pulse Rate 08/09/20 1547 (!) 110     Resp 08/09/20 1547 20     Temp 08/09/20 1547 99 F (37.2 C)     Temp Source 08/09/20 1547 Oral     SpO2 08/09/20 1547  95 %     Weight 08/09/20 1545 200 lb 9.9 oz (91 kg)     Height 08/09/20 1545 6' (1.829 m)     Head Circumference --      Peak Flow --      Pain Score 08/09/20 1545 7     Pain Loc --      Pain Edu? --      Excl. in [place]? --     Constitutional: Alert and oriented. Well appearing and in no acute distress. Eyes: Conjunctivae are normal. EOMI. Head: Atraumatic. Nose: No congestion/rhinnorhea. Mouth/Throat: Mucous membranes are moist.   Neck: No stridor. Trachea Midline. FROM Cardiovascular: Normal rate, regular rhythm. Grossly normal heart sounds.  Good peripheral circulation. Respiratory: Normal respiratory effort.  No retractions. Lungs CTAB. Gastrointestinal: Soft and nontender. No distention. No abdominal bruits.  Musculoskeletal: 1+ edema on the left side.  Left leg is greater than the right.  Ulcer noted on the bottom of the foot with redness that has gone up over the ankle and midway up the calf.  2+ distal pulse Neurologic:  Normal speech and language. No gross focal neurologic deficits are appreciated.  Skin:  Skin is warm, dry and intact. No rash noted. Psychiatric: Mood and affect are normal. Speech and behavior are normal. GU: Deferred   ____________________________________________   LABS (all labs ordered are listed, but only abnormal results are displayed)  Labs Reviewed  COMPREHENSIVE METABOLIC PANEL - Abnormal; Notable for the following components:      Result Value   Sodium 125 (*)    Potassium 2.7 (*)    Chloride 86 (*)    Glucose, Bld 122 (*)    Total Protein 8.4 (*)    All other components within normal limits  CBC WITH DIFFERENTIAL/PLATELET - Abnormal; Notable for the following components:   WBC 14.6 (*)    RBC 3.46 (*)    Hemoglobin 12.7 (*)    HCT 35.2 (*)    MCV 101.7 (*)    MCH 36.7 (*)    MCHC 36.1 (*)    Neutro Abs 13.3 (*)    Lymphs Abs 0.4 (*)    Abs Immature Granulocytes 0.10 (*)    All  other components within normal limits  URINALYSIS,  COMPLETE (UACMP) WITH MICROSCOPIC - Abnormal; Notable for the following components:   Color, Urine STRAW (*)    APPearance CLEAR (*)    Hgb urine dipstick MODERATE (*)    All other components within normal limits  MAGNESIUM - Abnormal; Notable for the following components:   Magnesium 1.5 (*)    All other components within normal limits  RESP PANEL BY RT-PCR (FLU A&B, COVID) ARPGX2  CULTURE, BLOOD (ROUTINE X 2)  CULTURE, BLOOD (ROUTINE X 2)  LACTIC ACID, PLASMA  LACTIC ACID, PLASMA   ____________________________________________   ED ECG REPORT I, Vanessa [place], the attending physician, personally viewed and interpreted this ECG.  Sinus tachycardia rate of 111, no ST elevations, no T wave inversions, normal intervals ____________________________________________  RADIOLOGY   Official radiology report(s): US Venous Img Lower Unilateral Left  Result Date: 08/09/2020 CLINICAL DATA:  Left lower extremity edema and pain. EXAM: LEFT LOWER EXTREMITY VENOUS DOPPLER ULTRASOUND TECHNIQUE: Gray-scale sonography with compression, as well as color and duplex ultrasound, were performed to evaluate the deep venous system(s) from the level of the common femoral vein through the popliteal and proximal calf veins. COMPARISON:  None. FINDINGS: VENOUS Normal compressibility of the common femoral, superficial femoral, and popliteal veins, as well as the visualized calf veins. Visualized portions of profunda femoral vein and great saphenous vein unremarkable. No filling defects to suggest DVT on grayscale or color Doppler imaging. Doppler waveforms show normal direction of venous flow, normal respiratory plasticity and response to augmentation. Limited views of the contralateral common femoral vein are unremarkable. OTHER None. Limitations: none IMPRESSION: Negative. Electronically Signed   By: Fidela Salisbury M.D.   On: 08/09/2020 16:47     ____________________________________________   PROCEDURES  Procedure(s) performed (including Critical Care):  .Critical Care Performed by: Vanessa [place]ola[place], MD Authorized by: Vanessa [place], MD   Critical care provider statement:    Critical care time (minutes):  45   Critical care was necessary to treat or prevent imminent or life-threatening deterioration of the following conditions:  Sepsis   Critical care was time spent personally by me on the following activities:  Discussions with consultants, evaluation of patient's response to treatment, examination of patient, ordering and performing treatments and interventions, ordering and review of laboratory studies, ordering and review of radiographic studies, pulse oximetry, re-evaluation of patient's condition, obtaining history from patient or surrogate and review of old charts .1-3 Lead EKG Interpretation Performed by: Vanessa Maurice, MD Authorized by: Vanessa [place] [address], MD     Interpretation: abnormal     ECG rate:  110   ECG rate assessment: tachycardic     Rhythm: sinus rhythm     Ectopy: none     Conduction: normal       ____________________________________________   INITIAL IMPRESSION / ASSESSMENT AND PLAN / ED COURSE  [name] was evaluated in Emergency Department on 08/09/2020 for the symptoms described in the history of present illness. He was evaluated in the context of the global COVID-19 pandemic, which necessitated consideration that the patient might be at risk for infection with the SARS-CoV-2 virus that causes COVID-19. Institutional protocols and algorithms that pertain to the evaluation of patients at risk for COVID-19 are in a state of rapid change based on information released by regulatory bodies including the CDC and federal and state organizations. These policies and algorithms were followed during the patient's care in the ED.    Patient is  a 85 year old male who comes in with significant left leg swelling  and redness.  I suspect that this is most likely cellulitis.  Given his tachycardia and low-grade temperatures sepsis alert was called.  Patient will be started on 2 L of fluids, broad-spectrum antibiotics given history of MRSA.  At this time the cellulitis does go around the entire ankle joint there been no area to tap it and I suspect that this is more likely superficial because unable to help him move it without significant pain.  He denies any falls to suggest fractures.  Will get repeat ultrasound given the swelling to make sure no DVT.  No crepitus to suggest necrotizing fasciitis.   5:10 PM Patient is labs are elevated with a white count of 14.6.  Potassium is low at 2.7 sodium is low at 125.  We will start repeat to replete these  5:38 PM on repeat evaluations patient blood pressures remained stable.  His lactate is normal.  Patient already got 2 L of fluid and Tylenol for his tachycardia.  Given patient significant edema in his leg I do not want to fluid with overload him and patient's blood pressure has been stable his lactate is normal.  This time I do not feel like patient needs pressors.  Remains slightly tachycardic with no respiratory issues but remains warm and well-perfused.  Will discuss to the hospital team for admission        ____________________________________________   FINAL CLINICAL IMPRESSION(S) / ED DIAGNOSES   Final diagnoses:  Cellulitis, unspecified cellulitis site  Sepsis, due to unspecified organism, unspecified whether acute organ dysfunction present ([place])  Hypomagnesemia  Hypokalemia  Hyponatremia      MEDICATIONS GIVEN DURING THIS VISIT:  Medications  vancomycin (VANCOCIN) IVPB 1000 mg/200 mL premix (has no administration in time range)  potassium chloride 10 mEq in 100 mL IVPB (has no administration in time range)  potassium chloride SA (KLOR-CON) CR tablet 40 mEq (has no administration in time range)  magnesium sulfate IVPB 2 g 50 mL (has no  administration in time range)  lactated ringers bolus 1,000 mL (0 mLs Intravenous Stopped 08/09/20 1638)    And  lactated ringers bolus 1,000 mL (1,000 mLs Intravenous New Bag/Given 08/09/20 1638)  ceFEPIme (MAXIPIME) 2 g in sodium chloride 0.9 % 100 mL IVPB (0 g Intravenous Stopped 08/09/20 1636)  metroNIDAZOLE (FLAGYL) IVPB 500 mg (500 mg Intravenous New Bag/Given 08/09/20 1637)  acetaminophen (TYLENOL) tablet 1,000 mg (1,000 mg Oral Given 08/09/20 1653)  fentaNYL (SUBLIMAZE) injection 50 mcg (50 mcg Intravenous Given 08/09/20 1653)  ondansetron (ZOFRAN) injection 4 mg (4 mg Intravenous Given 08/09/20 1652)     ED Discharge Orders    None       Note:  This document was prepared using Dragon voice recognition software and may include unintentional dictation errors.   Vanessa Donnelly, MD 08/09/20 1739

## 2020-08-09 NOTE — ED Notes (Signed)
Patient is being discharged from the Urgent Care and sent to the Emergency Department via EMS . Per Lacinda Axon, MD, patient is in need of higher level of care due to cellulitis. Patient is aware and verbalizes understanding of plan of care.  Vitals:   08/09/20 1417 08/09/20 1502  BP: 118/74   Pulse: 90   Resp: 20   Temp: 99 F (37.2 C) (!) 100.8 F (38.2 C)  SpO2: 96%

## 2020-08-09 NOTE — ED Provider Notes (Signed)
[facility]    CSN: [id number] Arrival date & time: 08/09/20  1353      History   Chief Complaint Chief Complaint  Patient presents with  . Leg Pain   HPI  85 year old male presents with the above complaint.  Patient has a wound to his left heel.  He is being followed by wound care.  Was last seen on 2/24.  He states that his nurse changed his dressing today.  He reports pain of the left heel.  Patient states that he has peripheral neuropathy.  He states that he was advised to come in by his nurse for evaluation.  His pain is 8/10 in severity.  He states that he took 2 aspirin without improvement.  Patient unsure of fever at home.  No other complaints.  Past Medical History:  Diagnosis Date  . Arthritis   . Benign essential tremor   . Fracture, femur ([place]) 2012  . GERD (gastroesophageal reflux disease)   . H/O echocardiogram    05/2013  . Hx of colonoscopy    04/22/2013  . Hypercholesterolemia   . Hypertension   . Myocardial infarction ([place])    1987  . Polyneuropathy   . Prostate cancer ([place]) 2004  . Superficial hematoma     Patient Active Problem List   Diagnosis Date Noted  . Sepsis ([place]) 04/24/2020  . Cellulitis of knee, left 04/22/2020  . Hypokalemia 04/22/2020  . Osteoarthritis of multiple joints 12/15/2017  . Hyponatremia 03/28/2017  . Abnormality of gait and mobility 01/11/2017  . Cloudy vision 01/11/2017  . Foot drop, bilateral 01/11/2017  . Amputee, great toe, left ([place]) 01/11/2017  . 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 Macrocytic anemia 09/22/2015  . Moderate tricuspid insufficiency 04/21/2015  . History of prostate cancer 04/16/2015  . Idiopathic peripheral neuropathy 04/16/2015  . Essential hypertension 04/16/2015  . GERD (gastroesophageal reflux disease) 04/16/2015  . Arthritis 04/16/2015  . Bilateral lower extremity edema 04/16/2015  . Benign essential tremor 10/30/2014  . Hyperlipidemia, mixed 10/27/2014   . Chronic systolic CHF (congestive heart failure), NYHA class 2 ([place]) 04/23/2014  . Mitral insufficiency 11/05/2013  . CAD (coronary artery disease) 11/01/2013  . Basal cell carcinoma of skin of other parts of face 03/14/2013  . Ankle sprain 09/09/2011  . History of fibula fracture 09/09/2011    Past Surgical History:  Procedure Laterality Date  . BASAL CELL CARCINOMA EXCISION    . COLONOSCOPY  05/01/2009   Positive for colonic polyps  . COLONOSCOPY WITH PROPOFOL N/A 02/24/2016   Procedure: COLONOSCOPY WITH PROPOFOL;  Surgeon: Christene Lye, MD;  Location: [facility] ENDOSCOPY;  Service: Endoscopy;  Laterality: N/A;  . CORONARY ARTERY BYPASS GRAFT  1987   X2  . HERNIA REPAIR  1990  . PROSTATE SURGERY  2004       Home Medications    Prior to Admission medications   Medication Sig Start Date End Date Taking? Authorizing Provider  Alpha Lipoic Acid 200 MG CAPS Take 2 capsules by mouth daily.    [provider]  amLODipine (NORVASC) 2.5 MG tablet TAKE 1 TABLET BY MOUTH  DAILY Patient taking differently: Take 2.5 mg by mouth daily. 02/23/20   Karamalegos, Devonne Doughty, DO  Apoaequorin (PREVAGEN PO) Take by mouth.    [provider]  aspirin 81 MG tablet Take 81 mg by mouth daily.     [provider]  azelastine (ASTELIN) 0.1 % nasal spray Place 1 spray into both  nostrils 2 (two) times daily. Use in each nostril as directed 12/23/19   [name], DO  B Complex Vitamins (VITAMIN-B COMPLEX PO) Take 1 tablet by mouth daily. Reported on 10/06/2015    [provider]  Biotin 1000 MCG tablet Take 1,000 mcg by mouth daily.    [provider]  Calcium Carb-Cholecalciferol 500-100 MG-UNIT CHEW Chew 500 mg by mouth daily.    [provider]  cyanocobalamin 1000 MCG tablet Take 1,000 mcg by mouth daily.    [provider]  folic acid (FOLVITE) 1 MG tablet Take 1 mg by mouth daily.    [provider]  furosemide  (LASIX) 40 MG tablet Take 1 tablet (40 mg total) by mouth daily. 06/24/20   Karamalegos, Devonne Doughty, DO  gabapentin (NEURONTIN) 600 MG tablet TAKE 2 TABLETS BY MOUTH  TWICE DAILY Patient taking differently: Take 1,200 mg by mouth 2 (two) times daily. 02/23/20   Karamalegos, Devonne Doughty, DO  Glucosamine Sulfate 1000 MG CAPS Take 1 capsule by mouth daily.    [provider]  hydrochlorothiazide (HYDRODIURIL) 25 MG tablet TAKE 1 TABLET BY MOUTH  TWICE DAILY 07/05/20   Parks Ranger, Devonne Doughty, DO  Lecithin 400 MG CAPS Take 400 mg by mouth daily.    [provider]  linezolid (ZYVOX) 600 MG tablet Take 600 mg by mouth 2 (two) times daily.    [provider]  losartan (COZAAR) 50 MG tablet TAKE 1 TABLET BY MOUTH  DAILY Patient taking differently: Take 50 mg by mouth daily. 02/24/20   Karamalegos, Devonne Doughty, DO  Melatonin 3 MG CAPS Take 3 mg by mouth at bedtime.    [provider]  Omega 3 1000 MG CAPS Take 1,000 mg by mouth daily.    [provider]  omeprazole (PRILOSEC) 20 MG capsule TAKE 1 CAPSULE BY MOUTH  TWICE DAILY BEFORE MEALS Patient taking differently: Take 20 mg by mouth 2 (two) times daily before a meal. 02/23/20   Karamalegos, Devonne Doughty, DO  potassium chloride SA (KLOR-CON) 20 MEQ tablet Take 1 tablet (20 mEq total) by mouth 2 (two) times daily. 12/19/19   Karamalegos, Devonne Doughty, DO  pravastatin (PRAVACHOL) 80 MG tablet TAKE 1 TABLET BY MOUTH  DAILY Patient taking differently: Take 80 mg by mouth daily. 02/23/20   Karamalegos, Devonne Doughty, DO  primidone (MYSOLINE) 50 MG tablet TAKE 1 TABLET BY MOUTH 3  TIMES DAILY Patient taking differently: Take 50 mg by mouth 3 (three) times daily. 02/24/20   Karamalegos, Devonne Doughty, DO  Selenium 200 MCG CAPS Take 200 mcg by mouth daily.    [provider]  tretinoin (RETIN-A) 0.05 % cream Apply 1 application topically at bedtime.    [provider]    Family History Family History   Problem Relation Age of Onset  . Heart disease Mother   . Congestive Heart Failure Father     Social History Social History   Tobacco Use  . Smoking status: Never Smoker  . Smokeless tobacco: Never Used  Vaping Use  . Vaping Use: Never used  Substance Use Topics  . Alcohol use: Yes    Alcohol/week: 7.0 standard drinks    Types: 7 Glasses of wine per week    Comment: wine in the evening with dinner  . Drug use: No     Allergies   Codeine, Indocin [indomethacin], Latex, Mupirocin, Plavix [clopidogrel bisulfate], Polysporin [bacitracin-polymyxin b], Shellfish allergy, Tape, Neosporin [neomycin-bacitracin zn-polymyx], and Other  Review of Systems Review of Systems  Musculoskeletal:       Pain, left heel.  Skin: Positive for wound.   Physical Exam Triage Vital Signs ED Triage Vitals  Enc Vitals Group     BP 08/09/20 1417 118/74     Pulse Rate 08/09/20 1417 90     Resp 08/09/20 1417 20     Temp 08/09/20 1417 99 F (37.2 C)     Temp Source 08/09/20 1417 Oral     SpO2 08/09/20 1417 96 %     Weight --      Height --      Head Circumference --      Peak Flow --      Pain Score 08/09/20 1414 8     Pain Loc --      Pain Edu? --      Excl. in [place]? --    Updated Vital Signs BP 118/74   Pulse 90   Temp (!) 100.8 F (38.2 C) (Oral)   Resp 20   SpO2 96%   Visual Acuity Right Eye Distance:   Left Eye Distance:   Bilateral Distance:    Right Eye Near:   Left Eye Near:    Bilateral Near:     Physical Exam Vitals and nursing note reviewed.  Constitutional:      General: He is not in acute distress.    Comments: Chronically ill-appearing.  HENT:     Head: Normocephalic.  Eyes:     General:        Right eye: No discharge.        Left eye: No discharge.     Conjunctiva/sclera: Conjunctivae normal.  Cardiovascular:     Rate and Rhythm: Regular rhythm. Tachycardia present.  Pulmonary:     Effort: Pulmonary effort is normal. No respiratory distress.   Skin:         Comments: Patient has a small, superficial wound to the left heel (plantar aspect).  Patient has extensive erythema of the foot and left lower leg which extends proximally.   Neurological:     Mental Status: He is alert.  Psychiatric:        Mood and Affect: Mood normal.        Behavior: Behavior normal.    UC Treatments / Results  Labs (all labs ordered are listed, but only abnormal results are displayed) Labs Reviewed - No data to display  EKG   Radiology No results found.  Procedures Procedures (including critical care time)  Medications Ordered in UC Medications - No data to display  Initial Impression / Assessment and Plan / UC Course  I have reviewed the triage vital signs and the nursing notes.  Pertinent labs & imaging results that were available during my care of the patient were reviewed by me and considered in my medical decision making (see chart for details).    85 year old male presents with cellulitis.  I suspect that patient is septic given fever, tachycardia, and clinical exam findings consistent with cellulitis.  Patient being transported to the hospital via EMS.  Patient needs IV antibiotics and hospital admission.  Final Clinical Impressions(s) / UC Diagnoses   Final diagnoses:  Cellulitis of left lower extremity  Sepsis, due to unspecified organism, unspecified whether acute organ dysfunction present [facility])   Discharge Instructions   None    ED Prescriptions    None     PDMP not reviewed this encounter.   Coral Spikes, DO 08/09/20  1531  

## 2020-08-09 NOTE — ED Triage Notes (Addendum)
Pt c/o of left foot pain that starts at the heel and radiates to upper leg x 02/25. Wound nurse performs dressing changes every other day to left foot wound. He states the same wound is what appears to be infected per wound nurse. RN instructed pt to come to urgent care for furhter evaluation.   Denies fever.

## 2020-08-09 NOTE — H&P (Signed)
History and Physical   [name] YQM:[id number] DOB: 11-17-1935 DOA: 08/09/2020  PCP: [name], DO  Patient coming from: Urgent care  I have personally briefly reviewed patient's old medical records in [place].  Chief Concern: Left leg pain  HPI: [name] is a 85 y.o. male with medical history significant for idiopathic peripheral neuropathy, hypertension, hyperlipidemia, GERD, presented to the emergency department for chief concerns of left leg pain.  He describes the pain as throbbing, 8/10, worse with weight bearing, started Thursday, 08/06/2020.  He denies trauma to the area.  He denies fever at home, nausea, vomiting, chest pain, shortness of breath, abdominal pain, dysuria, hematuria.  Social history: He lives with his wife.  He denies tobacco use.  He endorses etoh, 1 drink consisting of half a glass of wine then he adds seltzer water, 3x per week.  He denies recreational drug use.  He is retired, and formerly was a [employer].  Vaccination: fully vaccinated, 3 vaccines for covid  Allergies: He denies life-threatening drug allergies.  ROS: Constitutional: no weight change, no fever ENT/Mouth: no sore throat, no rhinorrhea Eyes: no eye pain, no vision changes Cardiovascular: no chest pain, no dyspnea,  no edema, no palpitations Respiratory: no cough, no sputum, no wheezing Gastrointestinal: no nausea, no vomiting, no diarrhea, no constipation Genitourinary: no urinary incontinence, no dysuria, no hematuria Musculoskeletal: no arthralgias, no myalgias.  Left leg pain Skin: Left calcaneus/plantar foot wound that started on Thursday Neuro: + weakness, no loss of consciousness, no syncope Psych: no anxiety, no depression, no decrease appetite Heme/Lymph: no bruising, no bleeding  ED Course: Discussed with ED provider, patient requiring hospitalization due to possible sepsis from left lower foot wound.  Vitals in the emergency department was  concerning for fever, T-max of 100.6, heart rate 102, respiration rate 18-20, blood pressure 156/78, satting at 93% on room air.  Emergency department labs concerning for WBC 14.6, serum sodium 125, potassium 2.7, chloride 86, bicarb 26, BUN 18, serum creatinine of 0.83.  Hemoglobin/hematocrit/platelets were within normal limits.  Magnesium level 1.5, initial lactic acid 1.1.  Assessment/Plan  Active Problems:   Sepsis ([place])   Sepsis criteria are met with source of left foot wound -Elevated white cell count, elevated heart rate, elevated respiration rate -Status post broad-spectrum antibiotic including cefepime, vancomycin, metronidazole, sepsis bolus per ED provider -Lactic acid negative -Checking sed rate, CRP, x-ray for initial evaluation of osteomyelitis -Continue cefepime and vancomycin per pharmacy, history of MRSA and Pseudomonas positive cellulitis of the left foot -LR 125 mL/h for 1 day -CBC in the a.m. -elevated sed rate, MRI of the left foot ordered to assess for osteomyelitis -if positive for osteomyelitis, patient will need consultation to podiatry per primary hospitalist team  Electrolyte imbalance-suspect secondary to Lasix and hydrochlorothiazide -Status post potassium 10 mEq x 2 IV and potassium 40 mEq p.o. per ED -Time to recheck of BMP at 2100 -Holding hydrochlorothiazide and Lasix at this time  Hypertension-resumed amlodipine 2.5 mg -Holding home hydrochlorothiazide 25 mg daily, furosemide 40 mg daily -Hydralazine 25 mg every 8 hours as needed for SBP greater than 180, 2 days ordered  Hyperlipidemia-pravastatin 80 mg daily  Idiopathic peripheral neuropathy-resumed gabapentin 1200 mg twice daily p.o.  Insomnia-melatonin 5 mg daily at bedtime  GERD-PPI  As needed medications: Ondansetron and acetaminophen  Chart reviewed.   DVT prophylaxis: Enoxaparin 40 mg subcutaneous every 24 hours Code Status: Full code Diet: Heart healthy Family Communication:  updated spouse Disposition Plan: pending  clinical course Consults called: pending Admission status: Progressive cardiac with telemetry   Past Medical History:  Diagnosis Date  . Arthritis   . Benign essential tremor   . Fracture, femur ([place]) 2012  . GERD (gastroesophageal reflux disease)   . H/O echocardiogram    05/2013  . Hx of colonoscopy    04/22/2013  . Hypercholesterolemia   . Hypertension   . Myocardial infarction ([place])    1987  . Polyneuropathy   . Prostate cancer ([place]) 2004  . Superficial hematoma    Past Surgical History:  Procedure Laterality Date  . BASAL CELL CARCINOMA EXCISION    . COLONOSCOPY  05/01/2009   Positive for colonic polyps  . COLONOSCOPY WITH PROPOFOL N/A 02/24/2016   Procedure: COLONOSCOPY WITH PROPOFOL;  Surgeon: Christene Lye, MD;  Location: [facility] END[facility]COPY;  Service: Endoscopy;  Laterality: N/A;  . CORONARY ARTERY BYPASS GRAFT  1987   X2  . HERNIA REPAIR  1990  . PROSTATE SURGERY  2004   Social History:  reports that he has never smoked. He has never used smokeless tobacco. He reports current alcohol use of about 7.0 standard drinks of alcohol per week. He reports that he does not use drugs.  Allergies  Allergen Reactions  . Codeine Nausea And Vomiting  . Indocin [Indomethacin] Other (See Comments)  . Latex Other (See Comments)    Blisters  . Mupirocin     Blisters  . Plavix [Clopidogrel Bisulfate]     GI intolerance, n/v  . Polysporin [Bacitracin-Polymyxin B]     Blisters  . Shellfish Allergy   . Tape   . Neosporin [Neomycin-Bacitracin Zn-Polymyx] Rash  . Other Nausea And Vomiting    mussels   Family History  Problem Relation Age of Onset  . Heart disease Mother   . Congestive Heart Failure Father    Family history: Family history reviewed and not pertinent  Prior to Admission medications   Medication Sig Start Date End Date Taking? Authorizing Provider  Alpha Lipoic Acid 200 MG CAPS Take 2 capsules by mouth daily.     [provider]  amLODipine (NORVASC) 2.5 MG tablet TAKE 1 TABLET BY MOUTH  DAILY Patient taking differently: Take 2.5 mg by mouth daily. 02/23/20   Karamalegos, Devonne Doughty, DO  Apoaequorin (PREVAGEN PO) Take by mouth.    [provider]  aspirin 81 MG tablet Take 81 mg by mouth daily.     [provider]  azelastine (ASTELIN) 0.1 % nasal spray Place 1 spray into both nostrils 2 (two) times daily. Use in each nostril as directed 12/23/19   [name], DO  B Complex Vitamins (VITAMIN-B COMPLEX PO) Take 1 tablet by mouth daily. Reported on 10/06/2015    [provider]  Biotin 1000 MCG tablet Take 1,000 mcg by mouth daily.    [provider]  Calcium Carb-Cholecalciferol 500-100 MG-UNIT CHEW Chew 500 mg by mouth daily.    [provider]  cyanocobalamin 1000 MCG tablet Take 1,000 mcg by mouth daily.    [provider]  folic acid (FOLVITE) 1 MG tablet Take 1 mg by mouth daily.    [provider]  furosemide (LASIX) 40 MG tablet Take 1 tablet (40 mg total) by mouth daily. 06/24/20   Karamalegos, Devonne Doughty, DO  gabapentin (NEURONTIN) 600 MG tablet TAKE 2 TABLETS BY MOUTH  TWICE DAILY Patient taking differently: Take 1,200 mg by mouth 2 (two) times daily. 02/23/20   [name], DO  Glucosamine Sulfate 1000 MG CAPS Take 1 capsule by mouth daily.    [provider]  hydrochlorothiazide (HYDRODIURIL) 25 MG tablet TAKE 1 TABLET BY MOUTH  TWICE DAILY 07/05/20   Parks Ranger, Devonne Doughty, DO  Lecithin 400 MG CAPS Take 400 mg by mouth daily.    [provider]  linezolid (ZYVOX) 600 MG tablet Take 600 mg by mouth 2 (two) times daily.    [provider]  losartan (COZAAR) 50 MG tablet TAKE 1 TABLET BY MOUTH  DAILY Patient taking differently: Take 50 mg by mouth daily. 02/24/20   Karamalegos, Devonne Doughty, DO  Melatonin 3 MG CAPS Take 3 mg by mouth at bedtime.    [provider]   Omega 3 1000 MG CAPS Take 1,000 mg by mouth daily.    [provider]  omeprazole (PRILOSEC) 20 MG capsule TAKE 1 CAPSULE BY MOUTH  TWICE DAILY BEFORE MEALS Patient taking differently: Take 20 mg by mouth 2 (two) times daily before a meal. 02/23/20   Karamalegos, Devonne Doughty, DO  potassium chloride SA (KLOR-CON) 20 MEQ tablet Take 1 tablet (20 mEq total) by mouth 2 (two) times daily. 12/19/19   Karamalegos, Devonne Doughty, DO  pravastatin (PRAVACHOL) 80 MG tablet TAKE 1 TABLET BY MOUTH  DAILY Patient taking differently: Take 80 mg by mouth daily. 02/23/20   Karamalegos, Devonne Doughty, DO  primidone (MYSOLINE) 50 MG tablet TAKE 1 TABLET BY MOUTH 3  TIMES DAILY Patient taking differently: Take 50 mg by mouth 3 (three) times daily. 02/24/20   Karamalegos, Devonne Doughty, DO  Selenium 200 MCG CAPS Take 200 mcg by mouth daily.    [provider]  tretinoin (RETIN-A) 0.05 % cream Apply 1 application topically at bedtime.    [provider]   Physical Exam: Vitals:   08/09/20 1547 08/09/20 1630 08/09/20 1635 08/09/20 1638  BP:  (!) 156/78    Pulse: (!) 110 (!) 110 (!) 116   Resp: _0 Temp: 99 F (37.2 C)   (!) 100.6 F (38.1 C)  TempSrc: Oral   Oral  SpO2: 95% 100% 97%   Weight:      Height:       Constitutional: appears age appropriate, NAD, calm, comfortable Eyes: PERRL, lids and conjunctivae normal ENMT: Mucous membranes are moist. Posterior pharynx clear of any exudate or lesions. Age-appropriate dentition. Hearing appropriate Neck: normal, supple, no masses, no thyromegaly Respiratory: clear to auscultation bilaterally, no wheezing, no crackles. Normal respiratory effort. No accessory muscle use.  Cardiovascular: Regular rate and rhythm, no murmurs / rubs / gallops. No extremity edema. 2+ pedal pulses. No carotid bruits.  Abdomen: no tenderness, no masses palpated, no hepatosplenomegaly. Bowel sounds positive.  Musculoskeletal: no clubbing / cyanosis. No joint  deformity upper and lower extremities. Good ROM, no contractures, no atrophy. Normal muscle tone.  Skin: no rashes, lesions, ulcers. No induration Neurologic: Sensation intact. Strength 5/5 in all 4.  Psychiatric: Normal judgment and insight. Alert and oriented x 3. Normal mood.   EKG: independently reviewed, showing sinus tachycardia with rate of 111, QTc 458  Chest x-ray on Admission: I personally reviewed and I agree with radiologist reading as below.  US Venous Img Lower Unilateral Left  Result Date: 08/09/2020 CLINICAL DATA:  Left lower extremity edema and pain. EXAM: LEFT LOWER EXTREMITY VENOUS DOPPLER ULTRASOUND TECHNIQUE: Gray-scale sonography with compression, as well as color and duplex ultrasound, were performed to evaluate the deep venous system(s) from the level of  the common femoral vein through the popliteal and proximal calf veins. COMPARISON:  None. FINDINGS: VENOUS Normal compressibility of the common femoral, superficial femoral, and popliteal veins, as well as the visualized calf veins. Visualized portions of profunda femoral vein and great saphenous vein unremarkable. No filling defects to suggest DVT on grayscale or color Doppler imaging. Doppler waveforms show normal direction of venous flow, normal respiratory plasticity and response to augmentation. Limited views of the contralateral common femoral vein are unremarkable. OTHER None. Limitations: none IMPRESSION: Negative. Electronically Signed   By: Fidela Salisbury M.D.   On: 08/09/2020 16:47   Labs on Admission: I have personally reviewed following labs  CBC: Recent Labs  Lab 08/09/20 1551  WBC 14.6*  NEUTROABS 13.3*  HGB 12.7*  HCT 35.2*  MCV 101.7*  PLT 747   Basic Metabolic Panel: Recent Labs  Lab 08/09/20 1551  NA 125*  K 2.7*  CL 86*  CO2 26  GLUCOSE 122*  BUN 18  CREATININE 0.83  CALCIUM 9.7  MG 1.5*   GFR: Estimated Creatinine Clearance: 75.3 mL/min (by C-G formula based on SCr of 0.83  mg/dL). Liver Function Tests: Recent Labs  Lab 08/09/20 1551  AST 17  ALT 13  ALKPHOS 61  BILITOT 1.1  PROT 8.4*  ALBUMIN 4.2   Urine analysis:    Component Value Date/Time   COLORURINE STRAW (A) 08/09/2020 1549   APPEARANCEUR CLEAR (A) 08/09/2020 1549   LABSPEC 1.005 08/09/2020 1549   PHURINE 6.0 08/09/2020 1549   GLUCOSEU NEGATIVE 08/09/2020 1549   HGBUR MODERATE (A) 08/09/2020 1549   BILIRUBINUR NEGATIVE 08/09/2020 1549   KETONESUR NEGATIVE 08/09/2020 1549   PROTEINUR NEGATIVE 08/09/2020 1549   NITRITE NEGATIVE 08/09/2020 1549   LEUKOCYTESUR NEGATIVE 08/09/2020 1549   Amy N Cox D.O. [facility]  If 7PM-7AM, please contact overnight-coverage provider If 7AM-7PM, please contact day coverage provider [email or web address]  08/09/2020, 5:42 PM

## 2020-08-09 NOTE — ED Notes (Signed)
US at bedside

## 2020-08-09 NOTE — Consult Note (Addendum)
PHARMACY -  BRIEF ANTIBIOTIC NOTE   Pharmacy has received consult(s) for vancomycin and cefepime from an ED provider.  The patient's profile has been reviewed for ht/wt/allergies/indication/available labs.    One time order(s) placed by EDP for vancomycin 1 g and cefepime 2 g  Further antibiotics/pharmacy consults should be ordered by admitting physician if indicated.                       Thank you,  Benn Moulder, PharmD Pharmacy Resident  08/09/2020 4:00 PM

## 2020-08-09 NOTE — Consult Note (Signed)
CODE SEPSIS - PHARMACY COMMUNICATION  **Broad Spectrum Antibiotics should be administered within 1 hour of Sepsis diagnosis**  Time Code Sepsis Called/Page Received: 1557  Antibiotics Ordered: vancomycin and cefepime  Time of 1st antibiotic administration: 0160  Additional action taken by pharmacy: N/A  If necessary, Name of Provider/Nurse Contacted: N/A  Benn Moulder, PharmD Pharmacy Resident  08/09/2020 4:01 PM

## 2020-08-10 ENCOUNTER — Inpatient Hospital Stay
Admit: 2020-08-10 | Discharge: 2020-08-10 | Disposition: A | Discharge status: Home / Self Care | Payer: Medicare Other | Attending: Infectious Diseases | Admitting: Infectious Diseases

## 2020-08-10 DIAGNOSIS — L97429 Non-pressure chronic ulcer of left heel and midfoot with unspecified severity: Secondary | ICD-10-CM | POA: Diagnosis present

## 2020-08-10 DIAGNOSIS — I5022 Chronic systolic (congestive) heart failure: Secondary | ICD-10-CM | POA: Diagnosis present

## 2020-08-10 DIAGNOSIS — I110 Hypertensive heart disease with heart failure: Secondary | ICD-10-CM | POA: Diagnosis present

## 2020-08-10 DIAGNOSIS — E7800 Pure hypercholesterolemia, unspecified: Secondary | ICD-10-CM | POA: Diagnosis present

## 2020-08-10 DIAGNOSIS — E785 Hyperlipidemia, unspecified: Secondary | ICD-10-CM | POA: Diagnosis present

## 2020-08-10 DIAGNOSIS — E871 Hypo-osmolality and hyponatremia: Secondary | ICD-10-CM | POA: Diagnosis present

## 2020-08-10 DIAGNOSIS — A419 Sepsis, unspecified organism: Secondary | ICD-10-CM | POA: Diagnosis not present

## 2020-08-10 DIAGNOSIS — L02619 Cutaneous abscess of unspecified foot: Secondary | ICD-10-CM | POA: Diagnosis not present

## 2020-08-10 DIAGNOSIS — I2510 Atherosclerotic heart disease of native coronary artery without angina pectoris: Secondary | ICD-10-CM | POA: Diagnosis present

## 2020-08-10 DIAGNOSIS — B9562 Methicillin resistant Staphylococcus aureus infection as the cause of diseases classified elsewhere: Secondary | ICD-10-CM

## 2020-08-10 DIAGNOSIS — L03119 Cellulitis of unspecified part of limb: Secondary | ICD-10-CM | POA: Diagnosis not present

## 2020-08-10 DIAGNOSIS — G4700 Insomnia, unspecified: Secondary | ICD-10-CM | POA: Diagnosis present

## 2020-08-10 DIAGNOSIS — Y929 Unspecified place or not applicable: Secondary | ICD-10-CM | POA: Diagnosis not present

## 2020-08-10 DIAGNOSIS — E876 Hypokalemia: Secondary | ICD-10-CM | POA: Diagnosis present

## 2020-08-10 DIAGNOSIS — T502X5A Adverse effect of carbonic-anhydrase inhibitors, benzothiadiazides and other diuretics, initial encounter: Secondary | ICD-10-CM | POA: Diagnosis present

## 2020-08-10 DIAGNOSIS — Z91013 Allergy to seafood: Secondary | ICD-10-CM | POA: Diagnosis not present

## 2020-08-10 DIAGNOSIS — Z8249 Family history of ischemic heart disease and other diseases of the circulatory system: Secondary | ICD-10-CM | POA: Diagnosis not present

## 2020-08-10 DIAGNOSIS — R7881 Bacteremia: Secondary | ICD-10-CM

## 2020-08-10 DIAGNOSIS — L089 Local infection of the skin and subcutaneous tissue, unspecified: Secondary | ICD-10-CM

## 2020-08-10 DIAGNOSIS — Z91040 Latex allergy status: Secondary | ICD-10-CM | POA: Diagnosis not present

## 2020-08-10 DIAGNOSIS — L03116 Cellulitis of left lower limb: Secondary | ICD-10-CM | POA: Diagnosis present

## 2020-08-10 DIAGNOSIS — Z85828 Personal history of other malignant neoplasm of skin: Secondary | ICD-10-CM | POA: Diagnosis not present

## 2020-08-10 DIAGNOSIS — Z888 Allergy status to other drugs, medicaments and biological substances status: Secondary | ICD-10-CM | POA: Diagnosis not present

## 2020-08-10 DIAGNOSIS — A4102 Sepsis due to Methicillin resistant Staphylococcus aureus: Secondary | ICD-10-CM | POA: Diagnosis present

## 2020-08-10 DIAGNOSIS — L03032 Cellulitis of left toe: Secondary | ICD-10-CM

## 2020-08-10 DIAGNOSIS — G609 Hereditary and idiopathic neuropathy, unspecified: Secondary | ICD-10-CM | POA: Diagnosis present

## 2020-08-10 DIAGNOSIS — G9009 Other idiopathic peripheral autonomic neuropathy: Secondary | ICD-10-CM

## 2020-08-10 DIAGNOSIS — I252 Old myocardial infarction: Secondary | ICD-10-CM | POA: Diagnosis not present

## 2020-08-10 DIAGNOSIS — K219 Gastro-esophageal reflux disease without esophagitis: Secondary | ICD-10-CM | POA: Diagnosis present

## 2020-08-10 DIAGNOSIS — Z79899 Other long term (current) drug therapy: Secondary | ICD-10-CM | POA: Diagnosis not present

## 2020-08-10 DIAGNOSIS — Z8546 Personal history of malignant neoplasm of prostate: Secondary | ICD-10-CM | POA: Diagnosis not present

## 2020-08-10 DIAGNOSIS — L02612 Cutaneous abscess of left foot: Secondary | ICD-10-CM | POA: Diagnosis present

## 2020-08-10 DIAGNOSIS — Z20822 Contact with and (suspected) exposure to covid-19: Secondary | ICD-10-CM | POA: Diagnosis present

## 2020-08-10 LAB — BLOOD CULTURE ID PANEL (REFLEXED) - BCID2

## 2020-08-10 LAB — CBC
HCT: 27.4 % — ABNORMAL LOW (ref 39.0–52.0)
Hemoglobin: 9.7 g/dL — ABNORMAL LOW (ref 13.0–17.0)
MCH: 36.2 pg — ABNORMAL HIGH (ref 26.0–34.0)
MCHC: 35.4 g/dL (ref 30.0–36.0)
MCV: 102.2 fL — ABNORMAL HIGH (ref 80.0–100.0)
Platelets: 207 10*3/uL (ref 150–400)
RBC: 2.68 MIL/uL — ABNORMAL LOW (ref 4.22–5.81)
RDW: 12.4 % (ref 11.5–15.5)
WBC: 11.3 10*3/uL — ABNORMAL HIGH (ref 4.0–10.5)
nRBC: 0 % (ref 0.0–0.2)

## 2020-08-10 LAB — BASIC METABOLIC PANEL
Anion gap: 11 (ref 5–15)
BUN: 19 mg/dL (ref 8–23)
CO2: 24 mmol/L (ref 22–32)
Calcium: 8.8 mg/dL — ABNORMAL LOW (ref 8.9–10.3)
Chloride: 92 mmol/L — ABNORMAL LOW (ref 98–111)
Creatinine, Ser: 0.88 mg/dL (ref 0.61–1.24)
GFR, Estimated: >60 mL/min (ref >60)
Glucose, Bld: 107 mg/dL — ABNORMAL HIGH (ref 70–99)
Potassium: 2.9 mmol/L — ABNORMAL LOW (ref 3.5–5.1)
Sodium: 127 mmol/L — ABNORMAL LOW (ref 135–145)

## 2020-08-10 LAB — MRSA PCR SCREENING: MRSA by PCR: POSITIVE — AB

## 2020-08-10 LAB — C-REACTIVE PROTEIN: CRP: 20.6 mg/dL — ABNORMAL HIGH (ref <1.0)

## 2020-08-10 MED ORDER — CHLORHEXIDINE GLUCONATE CLOTH 2 % EX PADS
6.0000 | MEDICATED_PAD | Freq: Every day | CUTANEOUS | Status: AC
  Administered 2020-08-11 – 2020-08-15 (×4): 6 via TOPICAL

## 2020-08-10 MED ORDER — SODIUM CHLORIDE 0.9 % IV SOLN
INTRAVENOUS | Status: DC | PRN
  Administered 2020-08-10 (×2): 250 mL via INTRAVENOUS

## 2020-08-10 MED ORDER — KETOROLAC TROMETHAMINE 30 MG/ML IJ SOLN
30.0000 mg | Freq: Once | INTRAMUSCULAR | Status: AC
  Administered 2020-08-10: 30 mg via INTRAVENOUS
  Filled 2020-08-10: qty 1

## 2020-08-10 MED ORDER — POTASSIUM CHLORIDE CRYS ER 20 MEQ PO TBCR
40.0000 meq | EXTENDED_RELEASE_TABLET | ORAL | Status: AC
  Administered 2020-08-10 (×2): 40 meq via ORAL
  Filled 2020-08-10 (×2): qty 2

## 2020-08-10 NOTE — Consult Note (Signed)
NAME: [name]  DOB: 03-18-1936  MRN: [id number]  Date/Time: 08/10/2020 11:53 AM  REQUESTING PROVIDER: Dr.Griffith Subjective:  REASON FOR CONSULT: MRSA bacteremia ?wife at bed side- h/o from patient, wife an chart [name] is a 85 y.o. 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 The wound nurse who came home ot do the dressing asked him to go to the ED He was last seen at the  wound clinic on 08/06/20 and had debridement of the callus and was told the foot was looking okay He has h/o MRSA infection in the same foot and had amputation of the left great toe He doe snot have DM but has idiopathic peripheral neuropathy In the ED temp was 98.3, BP 125/62, RR 18 Sats 99% WBC 14.6, HB 12.7, PLT 263 and cr 1.02 Blood culture sent and he was started on vanco and cefepime I am seeing patient as he has MRSA bacteremia  Past Medical History:  Diagnosis Date  . Arthritis   . Benign essential tremor   . Fracture, femur ([place]) 2012  . GERD (gastroesophageal reflux disease)   . H/O echocardiogram    05/2013  . Hx of colonoscopy    04/22/2013  . Hypercholesterolemia   . Hypertension   . Myocardial infarction ([place])    1987  . Polyneuropathy   . Prostate cancer ([place]) 2004  . Superficial hematoma     Past Surgical History:  Procedure Laterality Date  . BASAL CELL CARCINOMA EXCISION    . COLONOSCOPY  05/01/2009   Positive for colonic polyps  . COLONOSCOPY WITH PROPOFOL N/A 02/24/2016   Procedure: COLONOSCOPY WITH PROPOFOL;  Surgeon: Christene Lye, MD;  Location: [facility] ENDOSCOPY;  Service: Endoscopy;  Laterality: N/A;  . CORONARY ARTERY BYPASS GRAFT  1987   X2  . HERNIA REPAIR  1990  . PROSTATE SURGERY  2004    Social History    Socioeconomic History  . Marital status: Married    Spouse name: Not on file  . Number of children: Not on file  . Years of education: Not on file  . Highest education level: Not on file  Occupational History  . Not on file  Tobacco Use  . Smoking status: Never Smoker  . Smokeless tobacco: Never Used  Vaping Use  . Vaping Use: Never used  Substance and Sexual Activity  . Alcohol use: Yes    Alcohol/week: 7.0 standard drinks    Types: 7 Glasses of wine per week    Comment: wine in the evening with dinner  . Drug use: No  . Sexual activity: Not on file  Other Topics Concern  . Not on file  Social History Narrative  . Not on file   Social Determinants of Health   Financial Resource Strain: Not on file  Food Insecurity: Not on file  Transportation Needs: Not on file  Physical Activity: Not on file  Stress: Not on file  Social Connections: Not on file  Intimate Partner Violence: Not on file    Family History  Problem Relation Age of Onset  . Heart disease Mother   . Congestive Heart Failure Father    Allergies  Allergen Reactions  . Codeine Nausea And Vomiting  . Indocin [Indomethacin] Other (See  Comments)  . Latex Other (See Comments)    Blisters  . Mupirocin     Blisters  . Plavix [Clopidogrel Bisulfate]     GI intolerance, n/v  . Polysporin [Bacitracin-Polymyxin B]     Blisters  . Shellfish Allergy   . Tape   . Neosporin [Neomycin-Bacitracin Zn-Polymyx] Rash  . Other Nausea And Vomiting    mussels   I? Current Facility-Administered Medications  Medication Dose Route Frequency Provider Last Rate Last Admin  . acetaminophen (TYLENOL) tablet 1,000 mg  1,000 mg Oral Q6H PRN Cox, Amy N, DO   1,000 mg at 08/09/20 2309   Or  . acetaminophen (TYLENOL) suppository 650 mg  650 mg Rectal Q6H PRN Cox, Amy N, DO      . amLODipine (NORVASC) tablet 2.5 mg  2.5 mg Oral Daily Cox, Amy N, DO   2.5 mg at 08/10/20 1029  . aspirin EC tablet 81 mg  81 mg Oral Daily Cox,  Amy N, DO   81 mg at 08/10/20 1029  . B-complex with vitamin C tablet 1 tablet  1 tablet Oral Daily Cox, Amy N, DO   1 tablet at 08/10/20 1030  . calcium citrate-vitamin D 500-500 MG-UNIT per chewable tablet 1 tablet  1 tablet Oral Daily Cox, Amy N, DO   1 tablet at 08/10/20 1031  . ceFEPIme (MAXIPIME) 2 g in sodium chloride 0.9 % 100 mL IVPB  2 g Intravenous Q8H Cox, Amy N, DO 200 mL/hr at 08/10/20 0611 2 g at 08/10/20 0611  . diphenhydrAMINE (BENADRYL) capsule 25 mg  25 mg Oral QHS PRN Cox, Amy N, DO   25 mg at 08/09/20 2309  . enoxaparin (LOVENOX) injection 40 mg  40 mg Subcutaneous Q24H Cox, Amy N, DO   40 mg at 64/40/34 7425  . folic acid (FOLVITE) tablet 1 mg  1 mg Oral Daily Cox, Amy N, DO   1 mg at 08/10/20 1030  . gabapentin (NEURONTIN) tablet 1,200 mg  1,200 mg Oral BID Cox, Amy N, DO   1,200 mg at 08/10/20 1029  . hydrALAZINE (APRESOLINE) tablet 25 mg  25 mg Oral Q8H PRN Cox, Amy N, DO      . lactated ringers infusion   Intravenous Continuous Cox, Amy N, DO 125 mL/hr at 08/10/20 9563 New Bag at 08/10/20 8756  . melatonin tablet 5 mg  5 mg Oral QHS Cox, Amy N, DO   5 mg at 08/09/20 2309  . ondansetron (ZOFRAN) tablet 4 mg  4 mg Oral Q6H PRN Cox, Amy N, DO       Or  . ondansetron (ZOFRAN) injection 4 mg  4 mg Intravenous Q6H PRN Cox, Amy N, DO      . pantoprazole (PROTONIX) EC tablet 40 mg  40 mg Oral Daily Cox, Amy N, DO   40 mg at 08/10/20 1029  . potassium chloride SA (KLOR-CON) CR tablet 40 mEq  40 mEq Oral Q4H Nicole Kindred A, DO   40 mEq at 08/10/20 1030  . pravastatin (PRAVACHOL) tablet 80 mg  80 mg Oral Daily Cox, Amy N, DO   80 mg at 08/10/20 1029  . primidone (MYSOLINE) tablet 50 mg  50 mg Oral TID Cox, Amy N, DO   50 mg at 08/10/20 1030  . traMADol (ULTRAM) tablet 50 mg  50 mg Oral Q6H PRN Sharion Settler, NP   50 mg at 08/09/20 2309  . vancomycin (VANCOREADY) IVPB 1000 mg/200 mL  1,000 mg Intravenous Q12H  Cox, Amy N, DO      . vitamin B-12 (CYANOCOBALAMIN) tablet 1,000 mcg   1,000 mcg Oral Daily Cox, Amy N, DO   1,000 mcg at 08/10/20 1029     Abtx:  Anti-infectives (From admission, onward)   Start     Dose/Rate Route Frequency Ordered Stop   08/10/20 1100  vancomycin (VANCOREADY) IVPB 1000 mg/200 mL        1,000 mg 200 mL/hr over 60 Minutes Intravenous Every 12 hours 08/09/20 1905     08/09/20 2345  ceFEPIme (MAXIPIME) 2 g in sodium chloride 0.9 % 100 mL IVPB  Status:  Discontinued        2 g 200 mL/hr over 30 Minutes Intravenous Every 8 hours 08/09/20 1852 08/09/20 1854   08/09/20 2345  ceFEPIme (MAXIPIME) 2 g in sodium chloride 0.9 % 100 mL IVPB        2 g 200 mL/hr over 30 Minutes Intravenous Every 8 hours 08/09/20 1854     08/09/20 1930  vancomycin (VANCOCIN) IVPB 1000 mg/200 mL premix  Status:  Discontinued        1,000 mg 200 mL/hr over 60 Minutes Intravenous  Once 08/09/20 1852 08/09/20 1854   08/09/20 1930  vancomycin (VANCOCIN) IVPB 1000 mg/200 mL premix        1,000 mg 200 mL/hr over 60 Minutes Intravenous  Once 08/09/20 1854 08/10/20 0020   08/09/20 1600  ceFEPIme (MAXIPIME) 2 g in sodium chloride 0.9 % 100 mL IVPB        2 g 200 mL/hr over 30 Minutes Intravenous  Once 08/09/20 1555 08/09/20 1636   08/09/20 1600  metroNIDAZOLE (FLAGYL) IVPB 500 mg        500 mg 100 mL/hr over 60 Minutes Intravenous  Once 08/09/20 1555 08/09/20 1750   08/09/20 1600  vancomycin (VANCOCIN) IVPB 1000 mg/200 mL premix        1,000 mg 200 mL/hr over 60 Minutes Intravenous  Once 08/09/20 1555 08/09/20 1926      REVIEW OF SYSTEMS:  Const: fever, negative chills, negative weight loss Eyes: negative diplopia or visual changes, negative eye pain ENT: negative coryza, negative sore throat Resp: negative cough, hemoptysis, dyspnea Cards: negative for chest pain, palpitations, lower extremity edema GU: negative for frequency, dysuria and hematuria GI: Negative for abdominal pain, diarrhea, bleeding, constipation Skin: negative for rash and pruritus Heme: negative  for easy bruising and gum/nose bleeding MS: general weakness Neurolo:negative for headaches, dizziness, vertigo, memory problems , diminished sensation to touch, temp feet Psych: negative for feelings of anxiety, depression  Endocrine: negative for thyroid, diabetes Allergy/Immunology- as listed above  Objective:  VITALS:  BP 114/66 (BP Location: Left Arm)   Pulse 77   Temp 98.3 F (36.8 C) (Oral)   Resp 18   Ht 6' (1.829 m)   Wt 95.6 kg   SpO2 99%   BMI 28.59 kg/m  PHYSICAL EXAM:  General: Alert, cooperative, no distress, appears stated age. Hard of hearing Head: Normocephalic, without obvious abnormality, atraumatic. Eyes: Conjunctivae clear, anicteric sclerae. Pupils are equal ENT Nares normal. No drainage or sinus tenderness. Lips, mucosa, and tongue normal. No Thrush Neck: Supple, symmetrical, no adenopathy, thyroid: non tender no carotid bruit and no JVD. Back: No CVA tenderness. Lungs: Clear to auscultation bilaterally. No Wheezing or Rhonchi. No rales. Heart: Regular rate and rhythm, no murmur, rub or gallop. Abdomen: Soft, non-tender,not distended. Bowel sounds normal. No masses Extremities:      Swollen, erythematous- thick callus  on the heel- drainage  Skin: No rashes or lesions. Or bruising Lymph: Cervical, supraclavicular normal. Neurologic: peripheral neuropathy Pertinent Labs Lab Results CBC    Component Value Date/Time   WBC 11.3 (H) 08/10/2020 0503   RBC 2.68 (L) 08/10/2020 0503   HGB 9.7 (L) 08/10/2020 0503   HGB 12.7 09/29/2015 0845   HCT 27.4 (L) 08/10/2020 0503   HCT 37.9 09/29/2015 0845   PLT 207 08/10/2020 0503   PLT 263 09/29/2015 0845   MCV 102.2 (H) 08/10/2020 0503   MCV 105 (H) 09/29/2015 0845   MCV 107 (H) 01/04/2012 1053   MCH 36.2 (H) 08/10/2020 0503   MCHC 35.4 08/10/2020 0503   RDW 12.4 08/10/2020 0503   RDW 13.6 09/29/2015 0845   RDW 13.2 01/04/2012 1053   LYMPHSABS 0.4 (L) 08/09/2020 1551   LYMPHSABS 1.4 09/29/2015 0845    LYMPHSABS 1.2 01/04/2012 1053   MONOABS 0.7 08/09/2020 1551   MONOABS 0.4 01/04/2012 1053   EOSABS 0.0 08/09/2020 1551   EOSABS 0.1 09/29/2015 0845   EOSABS 0.1 01/04/2012 1053   BASOSABS 0.0 08/09/2020 1551   BASOSABS 0.0 09/29/2015 0845   BASOSABS 0.0 01/04/2012 1053    CMP Latest Ref Rng & Units 08/10/2020 08/09/2020 08/09/2020  Glucose 70 - 99 mg/dL 107(H) 135(H) 122(H)  BUN 8 - 23 mg/dL 19 18 18   Creatinine 0.61 - 1.24 mg/dL 0.88 1.02 0.83  Sodium 135 - 145 mmol/L 127(L) 126(L) 125(L)  Potassium 3.5 - 5.1 mmol/L 2.9(L) 3.3(L) 2.7(LL)  Chloride 98 - 111 mmol/L 92(L) 91(L) 86(L)  CO2 22 - 32 mmol/L 24 25 26   Calcium 8.9 - 10.3 mg/dL 8.8(L) 9.2 9.7  Total Protein 6.5 - 8.1 g/dL - - 8.4(H)  Total Bilirubin 0.3 - 1.2 mg/dL - - 1.1  Alkaline Phos 38 - 126 U/L - - 61  AST 15 - 41 U/L - - 17  ALT 0 - 44 U/L - - 13      Microbiology: Recent Results (from the past 240 hour(s))  Blood culture (routine x 2)     Status: None (Preliminary result)   Collection Time: 08/09/20  3:49 PM   Specimen: BLOOD  Result Value Ref Range Status   Specimen Description BLOOD BLOOD LEFT FOREARM  Final   Special Requests   Final    BOTTLES DRAWN AEROBIC AND ANAEROBIC Blood Culture adequate volume   Culture  Setup Time   Final    Organism ID to follow GRAM POSITIVE COCCI AEROBIC BOTTLE ONLY CRITICAL RESULT CALLED TO, READ BACK BY AND VERIFIED WITH: NATHAN BELUE ON 08/10/20 AT 6195 QSD Performed at [facility], [address] [place], [place] [address]    Culture GRAM POSITIVE COCCI  Final   Report Status PENDING  Incomplete  Blood Culture ID Panel (Reflexed)     Status: Abnormal   Collection Time: 08/09/20  3:49 PM  Result Value Ref Range Status   Enterococcus faecalis NOT DETECTED NOT DETECTED Final   Enterococcus Faecium NOT DETECTED NOT DETECTED Final   Listeria monocytogenes NOT DETECTED NOT DETECTED Final   Staphylococcus species DETECTED (A) NOT DETECTED Final    Comment:  CRITICAL RESULT CALLED TO, READ BACK BY AND VERIFIED WITH: NATHAN BELUE ON 08/10/20 AT 7124 QSD    Staphylococcus aureus (BCID) DETECTED (A) NOT DETECTED Final    Comment: Methicillin (oxacillin)-resistant Staphylococcus aureus (MRSA). MRSA is predictably resistant to beta-lactam antibiotics (except ceftaroline). Preferred therapy is vancomycin unless clinically contraindicated. Patient requires contact precautions if  hospitalized. CRITICAL RESULT CALLED TO, READ BACK BY AND VERIFIED WITH: NATHAN BELUE ON 08/10/20 AT 0616 QSD    Staphylococcus epidermidis NOT DETECTED NOT DETECTED Final   Staphylococcus lugdunensis NOT DETECTED NOT DETECTED Final   Streptococcus species NOT DETECTED NOT DETECTED Final   Streptococcus agalactiae NOT DETECTED NOT DETECTED Final   Streptococcus pneumoniae NOT DETECTED NOT DETECTED Final   Streptococcus pyogenes NOT DETECTED NOT DETECTED Final   A.calcoaceticus-baumannii NOT DETECTED NOT DETECTED Final   Bacteroides fragilis NOT DETECTED NOT DETECTED Final   Enterobacterales NOT DETECTED NOT DETECTED Final   Enterobacter cloacae complex NOT DETECTED NOT DETECTED Final   Escherichia coli NOT DETECTED NOT DETECTED Final   Klebsiella aerogenes NOT DETECTED NOT DETECTED Final   Klebsiella oxytoca NOT DETECTED NOT DETECTED Final   Klebsiella pneumoniae NOT DETECTED NOT DETECTED Final   Proteus species NOT DETECTED NOT DETECTED Final   Salmonella species NOT DETECTED NOT DETECTED Final   Serratia marcescens NOT DETECTED NOT DETECTED Final   Haemophilus influenzae NOT DETECTED NOT DETECTED Final   Neisseria meningitidis NOT DETECTED NOT DETECTED Final   Pseudomonas aeruginosa NOT DETECTED NOT DETECTED Final   Stenotrophomonas maltophilia NOT DETECTED NOT DETECTED Final   Candida albicans NOT DETECTED NOT DETECTED Final   Candida auris NOT DETECTED NOT DETECTED Final   Candida glabrata NOT DETECTED NOT DETECTED Final   Candida krusei NOT DETECTED NOT DETECTED  Final   Candida parapsilosis NOT DETECTED NOT DETECTED Final   Candida tropicalis NOT DETECTED NOT DETECTED Final   Cryptococcus neoformans/gattii NOT DETECTED NOT DETECTED Final   Meth resistant mecA/C and MREJ DETECTED (A) NOT DETECTED Final    Comment: CRITICAL RESULT CALLED TO, READ BACK BY AND VERIFIED WITH: NATHAN BELUE ON 08/10/20 AT 9450 QSD Performed at [facility], [address] [place], [place] [address]   Blood culture (routine x 2)     Status: None (Preliminary result)   Collection Time: 08/09/20  3:50 PM   Specimen: BLOOD  Result Value Ref Range Status   Specimen Description BLOOD RIGHT ANTECUBITAL  Final   Special Requests   Final    BOTTLES DRAWN AEROBIC AND ANAEROBIC Blood Culture adequate volume   Culture  Setup Time   Final    GRAM POSITIVE COCCI IN BOTH AEROBIC AND ANAEROBIC BOTTLES CRITICAL VALUE NOTED.  VALUE IS CONSISTENT WITH PREVIOUSLY REPORTED AND CALLED VALUE. Performed at [facility], [address] [place], [place] [address]    Culture [facility] POSITIVE COCCI  Final   Report Status PENDING  Incomplete  Resp Panel by RT-PCR (Flu A&B, Covid) Nasopharyngeal Swab     Status: None   Collection Time: 08/09/20  4:10 PM   Specimen: Nasopharyngeal Swab; Nasopharyngeal(NP) swabs in vial transport medium  Result Value Ref Range Status   SARS Coronavirus 2 by RT PCR NEGATIVE NEGATIVE Final    Comment: (NOTE) SARS-CoV-2 target nucleic acids are NOT DETECTED.  The SARS-CoV-2 RNA is generally detectable in upper respiratory specimens during the acute phase of infection. The lowest concentration of SARS-CoV-2 viral copies this assay can detect is 138 copies/mL. A negative result does not preclude SARS-Cov-2 infection and should not be used as the sole basis for treatment or other patient management decisions. A negative result may occur with  improper specimen collection/handling, submission of specimen other than nasopharyngeal swab, presence  of viral mutation(s) within the areas targeted by this assay, and inadequate number of viral copies(<138 copies/mL). A negative result must be combined  with clinical observations, patient history, and epidemiological information. The expected result is Negative.  Fact Sheet for Patients:  EntrepreneurPulse.com.au  Fact Sheet for Healthcare Providers:  IncredibleEmployment.be  This test is no t yet approved or cleared by the [place] FDA and  has been authorized for detection and/or diagnosis of SARS-CoV-2 by FDA under an Emergency Use Authorization (EUA). This EUA will remain  in effect (meaning this test can be used) for the duration of the COVID-19 declaration under Section 564(b)(1) of the Act, 21 U.S.C.section 360bbb-3(b)(1), unless the authorization is terminated  or revoked sooner.       Influenza A by PCR NEGATIVE NEGATIVE Final   Influenza B by PCR NEGATIVE NEGATIVE Final    Comment: (NOTE) The Xpert Xpress SARS-CoV-2/FLU/RSV plus assay is intended as an aid in the diagnosis of influenza from Nasopharyngeal swab specimens and should not be used as a sole basis for treatment. Nasal washings and aspirates are unacceptable for Xpert Xpress SARS-CoV-2/FLU/RSV testing.  Fact Sheet for Patients: EntrepreneurPulse.com.au  Fact Sheet for Healthcare Providers: IncredibleEmployment.be  This test is not yet approved or cleared by the [place] FDA and has been authorized for detection and/or diagnosis of SARS-CoV-2 by FDA under an Emergency Use Authorization (EUA). This EUA will remain in effect (meaning this test can be used) for the duration of the COVID-19 declaration under Section 564(b)(1) of the Act, 21 U.S.C. section 360bbb-3(b)(1), unless the authorization is terminated or revoked.  Performed at [facility], [address] [place], [place] [address]     IMAGING  RESULTS: MRI left foot There is diffuse subcutaneous edema and skin thickening seen surrounding the ankle and dorsum of the hindfoot. No loculated fluid collections are noted. No areas of abnormal enhancement are seen. I have personally reviewed the films ? Impression/Recommendation ? MRSA bacteremia Left foot cellulitis with left heel wound/callus with possible wound under the callus Currently on vanco and cefepime Had pseudomonas in the left foot wound last year Need Podiatry to see the patient for possible debridement Will repeat blood cultures Will get 2 d echo  Peripheral neuropathy Has b/l foot drop and has ASO brace  CAD s/p CABG ?on  Pravachol, aspirin ? ___________________________________________________ Discussed with patient,wife  requesting provider Note:  This document was prepared using Dragon voice recognition software and may include unintentional dictation errors.

## 2020-08-10 NOTE — Progress Notes (Signed)
PHARMACY - PHYSICIAN COMMUNICATION CRITICAL VALUE ALERT - BLOOD CULTURE IDENTIFICATION (BCID)  [name] is an 85 y.o. male who presented to [facility] on 08/09/2020 with a chief complaint of Leg pain  Assessment: Left foot wound, MRI suggestive of diffuse cellulitis , no osteomyelitis. Blood cultures from 2/27 with GPC in 1 of 4 bottles, BCID = MRSA.   Name of physician (or Provider) Contacted: ID to see  Current antibiotics: Vancomycin and Cefepime   Changes to prescribed antibiotics recommended:  Auto-ID consult  Results for orders placed or performed during the hospital encounter of 08/09/20  Blood Culture ID Panel (Reflexed) (Collected: 08/09/2020  3:49 PM)  Result Value Ref Range   Enterococcus faecalis NOT DETECTED NOT DETECTED   Enterococcus Faecium NOT DETECTED NOT DETECTED   Listeria monocytogenes NOT DETECTED NOT DETECTED   Staphylococcus species DETECTED (A) NOT DETECTED   Staphylococcus aureus (BCID) DETECTED (A) NOT DETECTED   Staphylococcus epidermidis NOT DETECTED NOT DETECTED   Staphylococcus lugdunensis NOT DETECTED NOT DETECTED   Streptococcus species NOT DETECTED NOT DETECTED   Streptococcus agalactiae NOT DETECTED NOT DETECTED   Streptococcus pneumoniae NOT DETECTED NOT DETECTED   Streptococcus pyogenes NOT DETECTED NOT DETECTED   A.calcoaceticus-baumannii NOT DETECTED NOT DETECTED   Bacteroides fragilis NOT DETECTED NOT DETECTED   Enterobacterales NOT DETECTED NOT DETECTED   Enterobacter cloacae complex NOT DETECTED NOT DETECTED   Escherichia coli NOT DETECTED NOT DETECTED   Klebsiella aerogenes NOT DETECTED NOT DETECTED   Klebsiella oxytoca NOT DETECTED NOT DETECTED   Klebsiella pneumoniae NOT DETECTED NOT DETECTED   Proteus species NOT DETECTED NOT DETECTED   Salmonella species NOT DETECTED NOT DETECTED   Serratia marcescens NOT DETECTED NOT DETECTED   Haemophilus influenzae NOT DETECTED NOT DETECTED   Neisseria meningitidis NOT DETECTED NOT DETECTED    Pseudomonas aeruginosa NOT DETECTED NOT DETECTED   Stenotrophomonas maltophilia NOT DETECTED NOT DETECTED   Candida albicans NOT DETECTED NOT DETECTED   Candida auris NOT DETECTED NOT DETECTED   Candida glabrata NOT DETECTED NOT DETECTED   Candida krusei NOT DETECTED NOT DETECTED   Candida parapsilosis NOT DETECTED NOT DETECTED   Candida tropicalis NOT DETECTED NOT DETECTED   Cryptococcus neoformans/gattii NOT DETECTED NOT DETECTED   Meth resistant mecA/C and MREJ DETECTED (A) NOT DETECTED    Doreene Eland, PharmD, BCPS.   Work Cell: [phone] 08/10/2020 8:18 AM   ]

## 2020-08-10 NOTE — Hospital Course (Addendum)
Summary of HPI on admission: 85 y.o. male with medical history significant for idiopathic peripheral neuropathy, hypertension, hyperlipidemia, GERD, presented to the ED on 08/09/2020 for chief concerns of worsening left leg pain since 2/24.  No preceding trauma.  No fevers or chills.  ED course: Temp 100.72F, tachycardic HR 106, respirations 23.  Labs notable for hyponatremia 125, hypokalemia 2.7, hypochloremia 86, hypomagnesemia 1.5, leukocytosis 14.6, mild anemia hemoglobin 12.7 macrocytic.  Negative COVID-19 and influenza's on PCR.  UA with moderate hemoglobin but negative for infection.  Blood cultures were drawn, returned positive for MRSA.  Hospital course to date: Admitted to hospitalist service for further evaluation and management of sepsis due to left foot cellulitis and multiple electrolyte derangements.  Left lower extremity venous US negative for DVT. 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 MRI left ankle negative for signs of abscess or osteomyelitis, but showed diffuse cellulitis of the ankle and hindfoot, sprain and partial tear of the ATFL and small joint effusion.  On broad-spectrum antibiotic coverage with vancomycin and cefepime.  Infectious disease consulted.

## 2020-08-10 NOTE — Progress Notes (Signed)
*  PRELIMINARY RESULTS* Echocardiogram 2D Echocardiogram has been performed.  [name] [name] 08/10/2020, 2:37 PM

## 2020-08-10 NOTE — Progress Notes (Signed)
PROGRESS NOTE    [name]   JEH:[id number]  DOB: 08-29-1935  PCP: Marcus Hauser, DO    DOA: 08/09/2020 LOS: 0   Brief Narrative   Summary of HPI on admission: 85 y.o. male with medical history significant for idiopathic peripheral neuropathy, hypertension, hyperlipidemia, GERD, presented to the ED on 08/09/2020 for chief concerns of worsening left leg pain since 2/24.  No preceding trauma.  No fevers or chills.  ED course: Temp 100 point 50F, tachycardic HR 106, respirations 23.  Labs notable for hyponatremia 125, hypokalemia 2.7, hypochloremia 86, hypomagnesemia 1.5, leukocytosis 14.6, mild anemia hemoglobin 12.7 macrocytic.  Negative COVID-19 and influenza's on PCR.  UA with moderate hemoglobin but negative for infection.  Blood cultures were drawn, returned positive for MRSA.  Hospital course to date: Admitted to hospitalist service for further evaluation and management of sepsis due to left foot cellulitis and multiple electrolyte derangements.  Left lower extremity venous US negative for DVT. 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 MRI left ankle negative for signs of abscess or osteomyelitis, but showed diffuse cellulitis of the ankle and hindfoot, sprain and partial tear of the ATFL and small joint effusion.  On broad-spectrum antibiotic coverage with vancomycin and cefepime.  Infectious disease consulted.      Assessment & Plan   Active Problems:   Sepsis ([place])   MRSA bacteremia    Sepsis secondary to MRSA bacteremia and Left Foot Cellulitis  Sepsis POA as evidenced by leukocytosis, tachycardia, tachypnea.  Treated per protocol in the ED.  Continued on broad spectrum antibiotics.   --ID consulted --Continue Vanc and Cefepime --Podiatry consulted for debridement of left heel thick callus with drainage  Hypokalemia - suspect secondary to diuretics Replaced.   Hold hydrochlorothiazide and Lasix, resume once K within normal and maintain adequate replacement  Hypertension -resumed amlodipine 2.5 mg -Holding home hydrochlorothiazide 25 mg daily, furosemide 40 mg daily -Hydralazine PRN  Hyperlipidemia-pravastatin 80 mg daily  Idiopathic peripheral neuropathy-resumed gabapentin 1200 mg twice daily p.o.  Insomnia-melatonin 5 mg daily at bedtime  GERD-PPI    DVT prophylaxis: enoxaparin (LOVENOX) injection 40 mg Start: 08/09/20 2000 Place TED hose Start: 08/09/20 1758   Diet:  Diet Orders (From admission, onward)    Start     Ordered   08/09/20 1759  Diet Heart Room service appropriate? Yes; Fluid consistency: Thin  Diet effective now       Question Answer Comment  Room service appropriate? Yes   Fluid consistency: Thin      08/09/20 1759            Code Status: Full Code    Subjective 08/10/20    Pt seen with wife at bedside.  He reports feeling okay, better.  No fever/chills.  Getting echo.   Foot still hot and tender.   Disposition Plan & Communication   Status is: Inpatient  Inpatient status appropriate due to severity of illness on IV antibiotics for MRSA bacteremia.  Dispo: The patient is from: Home              Anticipated d/c is to: likely home              Patient currently is NOT medically stable for d/c.   Difficult to place patient No   Family Communication: wife at bedside on rounds   Consults, Procedures, Significant Events   Consultants:   Infectious Disease  Podiatry  Procedures:   None  Antimicrobials:  Anti-infectives (From admission, onward)   Start     Dose/Rate Route Frequency Ordered Stop   08/10/20 1100  vancomycin (VANCOREADY) IVPB 1000 mg/200 mL        1,000 mg 200 mL/hr over 60 Minutes Intravenous Every 12 hours 08/09/20 1905     08/09/20 2345  ceFEPIme (MAXIPIME) 2 g in sodium chloride 0.9 % 100 mL IVPB  Status:  Discontinued        2 g 200 mL/hr over 30 Minutes Intravenous  Every 8 hours 08/09/20 1852 08/09/20 1854   08/09/20 2345  ceFEPIme (MAXIPIME) 2 g in sodium chloride 0.9 % 100 mL IVPB        2 g 200 mL/hr over 30 Minutes Intravenous Every 8 hours 08/09/20 1854     08/09/20 1930  vancomycin (VANCOCIN) IVPB 1000 mg/200 mL premix  Status:  Discontinued        1,000 mg 200 mL/hr over 60 Minutes Intravenous  Once 08/09/20 1852 08/09/20 1854   08/09/20 1930  vancomycin (VANCOCIN) IVPB 1000 mg/200 mL premix        1,000 mg 200 mL/hr over 60 Minutes Intravenous  Once 08/09/20 1854 08/10/20 0020   08/09/20 1600  ceFEPIme (MAXIPIME) 2 g in sodium chloride 0.9 % 100 mL IVPB        2 g 200 mL/hr over 30 Minutes Intravenous  Once 08/09/20 1555 08/09/20 1636   08/09/20 1600  metroNIDAZOLE (FLAGYL) IVPB 500 mg        500 mg 100 mL/hr over 60 Minutes Intravenous  Once 08/09/20 1555 08/09/20 1750   08/09/20 1600  vancomycin (VANCOCIN) IVPB 1000 mg/200 mL premix        1,000 mg 200 mL/hr over 60 Minutes Intravenous  Once 08/09/20 1555 08/09/20 1926        Micro    Objective   Vitals:   08/09/20 1900 08/09/20 1930 08/09/20 2215 08/10/20 0336  BP: 125/80 122/73 125/62 (!) 96/59  Pulse: 90 99 84 80  Resp: 19 15 18 16   Temp:  98.6 F (37 C) 98.3 F (36.8 C) 98.8 F (37.1 C)  TempSrc:  Oral Oral   SpO2: 97% 95% 99% 99%  Weight:    95.6 kg  Height:        Intake/Output Summary (Last 24 hours) at 08/10/2020 0902 Last data filed at 08/10/2020 0700 Gross per 24 hour  Intake 1150.95 ml  Output 450 ml  Net 700.95 ml   Filed Weights   08/09/20 1545 08/10/20 0336  Weight: 91 kg 95.6 kg    Physical Exam:  General exam: awake, alert, no acute distress Respiratory system: symmetric chest rise, normal respiratory effort, on room air. Cardiovascular system: RRR, no RLE edema, LLE edema due to cellulitis.   Central nervous system: A&O x3. no gross focal neurologic deficits, normal speech Extremities: left foot and lower leg with intense erythema and  swelling, hot to touch, very thick callus bottom of left heel.  RLE without edema. Psychiatry: normal mood, congruent affect, judgement and insight appear normal  Labs   Data Reviewed: I have personally reviewed following labs and imaging studies  CBC: Recent Labs  Lab 08/09/20 1551 08/10/20 0503  WBC 14.6* 11.3*  NEUTROABS 13.3*  --   HGB 12.7* 9.7*  HCT 35.2* 27.4*  MCV 101.7* 102.2*  PLT 263 448   Basic Metabolic Panel: Recent Labs  Lab 08/09/20 1551 08/09/20 2224 08/10/20 0503  NA 125* 126* 127*  K 2.7* 3.3* 2.9*  CL 86* 91* 92*  CO2 26 25 24   GLUCOSE 122* 135* 107*  BUN 18 18 19   CREATININE 0.83 1.02 0.88  CALCIUM 9.7 9.2 8.8*  MG 1.5*  --   --    GFR: Estimated Creatinine Clearance: 77.6 mL/min (by C-G formula based on SCr of 0.88 mg/dL). Liver Function Tests: Recent Labs  Lab 08/09/20 1551  AST 17  ALT 13  ALKPHOS 61  BILITOT 1.1  PROT 8.4*  ALBUMIN 4.2   No results for input(s): LIPASE, AMYLASE in the last 168 hours. No results for input(s): AMMONIA in the last 168 hours. Coagulation Profile: No results for input(s): INR, PROTIME in the last 168 hours. Cardiac Enzymes: No results for input(s): CKTOTAL, CKMB, CKMBINDEX, TROPONINI in the last 168 hours. BNP (last 3 results) No results for input(s): PROBNP in the last 8760 hours. HbA1C: No results for input(s): HGBA1C in the last 72 hours. CBG: No results for input(s): GLUCAP in the last 168 hours. Lipid Profile: No results for input(s): CHOL, HDL, LDLCALC, TRIG, CHOLHDL, LDLDIRECT in the last 72 hours. Thyroid Function Tests: Recent Labs    08/09/20 1804  TSH 0.270*   Anemia Panel: No results for input(s): VITAMINB12, FOLATE, FERRITIN, TIBC, IRON, RETICCTPCT in the last 72 hours. Sepsis Labs: Recent Labs  Lab 08/09/20 1549 08/09/20 1804  LATICACIDVEN 1.1 1.0    Recent Results (from the past 240 hour(s))  Blood culture (routine x 2)     Status: None (Preliminary result)   Collection  Time: 08/09/20  3:49 PM   Specimen: BLOOD  Result Value Ref Range Status   Specimen Description BLOOD BLOOD LEFT FOREARM  Final   Special Requests   Final    BOTTLES DRAWN AEROBIC AND ANAEROBIC Blood Culture adequate volume   Culture  Setup Time   Final    Organism ID to follow GRAM POSITIVE COCCI AEROBIC BOTTLE ONLY CRITICAL RESULT CALLED TO, READ BACK BY AND VERIFIED WITH: NATHAN BELUE ON 08/10/20 AT 1275 QSD Performed at [facility], [address] [place], [place] [address]    Culture GRAM POSITIVE COCCI  Final   Report Status PENDING  Incomplete  Blood Culture ID Panel (Reflexed)     Status: Abnormal   Collection Time: 08/09/20  3:49 PM  Result Value Ref Range Status   Enterococcus faecalis NOT DETECTED NOT DETECTED Final   Enterococcus Faecium NOT DETECTED NOT DETECTED Final   Listeria monocytogenes NOT DETECTED NOT DETECTED Final   Staphylococcus species DETECTED (A) NOT DETECTED Final    Comment: CRITICAL RESULT CALLED TO, READ BACK BY AND VERIFIED WITH: NATHAN BELUE ON 08/10/20 AT 7494 QSD    Staphylococcus aureus (BCID) DETECTED (A) NOT DETECTED Final    Comment: Methicillin (oxacillin)-resistant Staphylococcus aureus (MRSA). MRSA is predictably resistant to beta-lactam antibiotics (except ceftaroline). Preferred therapy is vancomycin unless clinically contraindicated. Patient requires contact precautions if  hospitalized. CRITICAL RESULT CALLED TO, READ BACK BY AND VERIFIED WITH: NATHAN BELUE ON 08/10/20 AT 0616 QSD    Staphylococcus epidermidis NOT DETECTED NOT DETECTED Final   Staphylococcus lugdunensis NOT DETECTED NOT DETECTED Final   Streptococcus species NOT DETECTED NOT DETECTED Final   Streptococcus agalactiae NOT DETECTED NOT DETECTED Final   Streptococcus pneumoniae NOT DETECTED NOT DETECTED Final   Streptococcus pyogenes NOT DETECTED NOT DETECTED Final   A.calcoaceticus-baumannii NOT DETECTED NOT DETECTED Final   Bacteroides fragilis NOT DETECTED  NOT DETECTED Final   Enterobacterales NOT DETECTED NOT DETECTED Final  Enterobacter cloacae complex NOT DETECTED NOT DETECTED Final   Escherichia coli NOT DETECTED NOT DETECTED Final   Klebsiella aerogenes NOT DETECTED NOT DETECTED Final   Klebsiella oxytoca NOT DETECTED NOT DETECTED Final   Klebsiella pneumoniae NOT DETECTED NOT DETECTED Final   Proteus species NOT DETECTED NOT DETECTED Final   Salmonella species NOT DETECTED NOT DETECTED Final   Serratia marcescens NOT DETECTED NOT DETECTED Final   Haemophilus influenzae NOT DETECTED NOT DETECTED Final   Neisseria meningitidis NOT DETECTED NOT DETECTED Final   Pseudomonas aeruginosa NOT DETECTED NOT DETECTED Final   Stenotrophomonas maltophilia NOT DETECTED NOT DETECTED Final   Candida albicans NOT DETECTED NOT DETECTED Final   Candida auris NOT DETECTED NOT DETECTED Final   Candida glabrata NOT DETECTED NOT DETECTED Final   Candida krusei NOT DETECTED NOT DETECTED Final   Candida parapsilosis NOT DETECTED NOT DETECTED Final   Candida tropicalis NOT DETECTED NOT DETECTED Final   Cryptococcus neoformans/gattii NOT DETECTED NOT DETECTED Final   Meth resistant mecA/C and MREJ DETECTED (A) NOT DETECTED Final    Comment: CRITICAL RESULT CALLED TO, READ BACK BY AND VERIFIED WITH: NATHAN BELUE ON 08/10/20 AT 7564 QSD Performed at [facility], [address] [place], [place] [address]   Blood culture (routine x 2)     Status: None (Preliminary result)   Collection Time: 08/09/20  3:50 PM   Specimen: BLOOD  Result Value Ref Range Status   Specimen Description BLOOD RIGHT ANTECUBITAL  Final   Special Requests   Final    BOTTLES DRAWN AEROBIC AND ANAEROBIC Blood Culture adequate volume   Culture  Setup Time   Final    GRAM POSITIVE COCCI AEROBIC BOTTLE ONLY CRITICAL VALUE NOTED.  VALUE IS CONSISTENT WITH PREVIOUSLY REPORTED AND CALLED VALUE. Performed at [facility], [address] [place],  [address]     Culture [facility] POSITIVE COCCI  Final   Report Status PENDING  Incomplete  Resp Panel by RT-PCR (Flu A&B, Covid) Nasopharyngeal Swab     Status: None   Collection Time: 08/09/20  4:10 PM   Specimen: Nasopharyngeal Swab; Nasopharyngeal(NP) swabs in vial transport medium  Result Value Ref Range Status   SARS Coronavirus 2 by RT PCR NEGATIVE NEGATIVE Final    Comment: (NOTE) SARS-CoV-2 target nucleic acids are NOT DETECTED.  The SARS-CoV-2 RNA is generally detectable in upper respiratory specimens during the acute phase of infection. The lowest concentration of SARS-CoV-2 viral copies this assay can detect is 138 copies/mL. A negative result does not preclude SARS-Cov-2 infection and should not be used as the sole basis for treatment or other patient management decisions. A negative result may occur with  improper specimen collection/handling, submission of specimen other than nasopharyngeal swab, presence of viral mutation(s) within the areas targeted by this assay, and inadequate number of viral copies(<138 copies/mL). A negative result must be combined with clinical observations, patient history, and epidemiological information. The expected result is Negative.  Fact Sheet for Patients:  EntrepreneurPulse.com.au  Fact Sheet for Healthcare Providers:  IncredibleEmployment.be  This test is no t yet approved or cleared by the [place] FDA and  has been authorized for detection and/or diagnosis of SARS-CoV-2 by FDA under an Emergency Use Authorization (EUA). This EUA will remain  in effect (meaning this test can be used) for the duration of the COVID-19 declaration under Section 564(b)(1) of the Act, 21 U.S.C.section 360bbb-3(b)(1), unless the authorization is terminated  or revoked sooner.  Influenza A by PCR NEGATIVE NEGATIVE Final   Influenza B by PCR NEGATIVE NEGATIVE Final    Comment: (NOTE) The Xpert Xpress SARS-CoV-2/FLU/RSV plus  assay is intended as an aid in the diagnosis of influenza from Nasopharyngeal swab specimens and should not be used as a sole basis for treatment. Nasal washings and aspirates are unacceptable for Xpert Xpress SARS-CoV-2/FLU/RSV testing.  Fact Sheet for Patients: EntrepreneurPulse.com.au  Fact Sheet for Healthcare Providers: IncredibleEmployment.be  This test is not yet approved or cleared by the [place] FDA and has been authorized for detection and/or diagnosis of SARS-CoV-2 by FDA under an Emergency Use Authorization (EUA). This EUA will remain in effect (meaning this test can be used) for the duration of the COVID-19 declaration under Section 564(b)(1) of the Act, 21 U.S.C. section 360bbb-3(b)(1), unless the authorization is terminated or revoked.  Performed at [facility], [address] [place], [place] [address]       Imaging Studies   MR ANKLE LEFT W WO CONTRAST  Result Date: 08/09/2020 CLINICAL DATA:  Left leg swelling and redness, question of osteomyelitis EXAM: MRI OF THE LEFT ANKLE WITHOUT AND WITH CONTRAST TECHNIQUE: Multiplanar, multisequence MR imaging of the ankle was performed before and after the administration of intravenous contrast. CONTRAST:  4mL GADAVIST GADOBUTROL 1 MMOL/ML IV SOLN COMPARISON:  Radiograph same day FINDINGS: Bones/Joint/Cartilage Osteoarthritis is seen at the first medial cuneiform metatarsal joint with joint space loss and subchondral cystic changes. Minimally increased signal seen within the lateral malleolus. No areas of cortical destruction or periosteal reaction. No areas of abnormal enhancement are seen throughout the osseous structures. Tibiotalar joint osteoarthritis is seen with joint space loss and diffuse chondral loss with subchondral marrow signal changes seen anteriorly. A small ankle joint effusion is seen. Ligaments Attenuation with increased signal seen within the anterior  talofibular ligament. There is also slight thickening of the spring ligament, however it is intact. The remainder of the ligaments appear to be intact. Muscles and Tendons Mildly increased signal seen within the multiple on the plantar surface. No focal atrophy or tear however is noted. A small amount of fluid is seen surrounding the posterior tibialis and peroneal tendons, however they are intact. There is minimally increased signal seen at the insertion site of the Achilles tendon with mild adjacent reactive marrow. Soft tissue There is diffuse subcutaneous edema and skin thickening seen surrounding the ankle and dorsum of the hindfoot. No loculated fluid collections are noted. No areas of abnormal enhancement are seen. IMPRESSION: IMPRESSION 1. Findings suggestive of diffuse cellulitis surrounding the ankle and hindfoot. No evidence of soft tissue abscess or osteomyelitis 2. Intrasubstance sprain and partial tear of the anterior talofibular ligament, however there are intact fibers throughout 3. Small ankle joint effusion 4. 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 Mild insertional Achilles tendinosis Electronically Signed   By: Prudencio Pair M.D.   On: 08/09/2020 22:03   US Venous Img Lower Unilateral Left  Result Date: 08/09/2020 CLINICAL DATA:  Left lower extremity edema and pain. EXAM: LEFT LOWER EXTREMITY VENOUS DOPPLER ULTRASOUND TECHNIQUE: Gray-scale sonography with compression, as well as color and duplex ultrasound, were performed to evaluate the deep venous system(s) from the level of the common femoral vein through the popliteal and proximal calf veins. COMPARISON:  None. FINDINGS: VENOUS Normal compressibility of the common femoral, superficial femoral, and popliteal veins, as well as the visualized calf veins. Visualized portions of profunda femoral vein and great saphenous vein unremarkable. No filling defects  to suggest DVT on grayscale or color Doppler imaging. Doppler  waveforms show normal direction of venous flow, normal respiratory plasticity and response to augmentation. Limited views of the contralateral common femoral vein are unremarkable. OTHER None. Limitations: none IMPRESSION: Negative. Electronically Signed   By: Fidela Salisbury M.D.   On: 08/09/2020 16:47   DG Foot 2 Views Left  Result Date: 08/09/2020 CLINICAL DATA:  left leg redness EXAM: LEFT FOOT - 2 VIEW COMPARISON:  MRI left foot 02/17/2016 FINDINGS: First and second digit distal phalangeal amputation. No cortical erosion or destruction. There is no evidence of fracture or dislocation. There is no evidence of severe arthropathy or other focal bone abnormality. Subcutaneus soft tissue edema. Vascular calcifications. IMPRESSION: 1. 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 2. No acute displaced fracture or dislocation of the bones of the left foot. Electronically Signed   By: Iven Finn M.D.   On: 08/09/2020 18:30     Medications   Scheduled Meds: . amLODipine  2.5 mg Oral Daily  . aspirin EC  81 mg Oral Daily  . B-complex with vitamin C  1 tablet Oral Daily  . calcium citrate-vitamin D  1 tablet Oral Daily  . enoxaparin (LOVENOX) injection  40 mg Subcutaneous Q24H  . folic acid  1 mg Oral Daily  . gabapentin  1,200 mg Oral BID  . melatonin  5 mg Oral QHS  . pantoprazole  40 mg Oral Daily  . potassium chloride  40 mEq Oral Q4H  . pravastatin  80 mg Oral Daily  . primidone  50 mg Oral TID  . cyanocobalamin  1,000 mcg Oral Daily   Continuous Infusions: . ceFEPime (MAXIPIME) IV 2 g (08/10/20 1941)  . lactated ringers 125 mL/hr at 08/10/20 7408  . vancomycin         LOS: 0 days    Time spent: 30 minutes    Ezekiel Slocumb, DO [facility]  08/10/2020, 9:02 AM      If 7PM-7AM, please contact night-coverage. How to contact the [facility] Attending or Consulting provider [place] or  covering provider during after hours [place], for this patient?    1. Check the care team in [facility] and look for a) attending/consulting [facility] provider listed and b) the [facility] team listed 2. Log into [email or web address] and use [place] universal password to access. If you do not have the password, please contact the hospital operator. 3. Locate the [facility] provider you are looking for under [facility] and page to a number that you can be directly reached. 4. If you still have difficulty reaching the provider, please page the [facility] (Director on Call) for the Hospitalists listed on amion for assistance.

## 2020-08-10 NOTE — Plan of Care (Signed)
  Problem: Safety: Goal: Ability to remain free from injury will improve Outcome: Progressing   Problem: Skin Integrity: Goal: Risk for impaired skin integrity will decrease Outcome: Not Progressing   Problem: Fluid Volume: Goal: Hemodynamic stability will improve Outcome: Progressing   Problem: Clinical Measurements: Goal: Signs and symptoms of infection will decrease Outcome: Progressing

## 2020-08-11 DIAGNOSIS — A419 Sepsis, unspecified organism: Secondary | ICD-10-CM | POA: Diagnosis not present

## 2020-08-11 DIAGNOSIS — R7881 Bacteremia: Secondary | ICD-10-CM | POA: Diagnosis not present

## 2020-08-11 DIAGNOSIS — I2510 Atherosclerotic heart disease of native coronary artery without angina pectoris: Secondary | ICD-10-CM | POA: Diagnosis not present

## 2020-08-11 DIAGNOSIS — L089 Local infection of the skin and subcutaneous tissue, unspecified: Secondary | ICD-10-CM | POA: Diagnosis not present

## 2020-08-11 DIAGNOSIS — B9562 Methicillin resistant Staphylococcus aureus infection as the cause of diseases classified elsewhere: Secondary | ICD-10-CM | POA: Diagnosis not present

## 2020-08-11 LAB — CBC
HCT: 29 % — ABNORMAL LOW (ref 39.0–52.0)
Hemoglobin: 10.4 g/dL — ABNORMAL LOW (ref 13.0–17.0)
MCH: 37.5 pg — ABNORMAL HIGH (ref 26.0–34.0)
MCHC: 35.9 g/dL (ref 30.0–36.0)
MCV: 104.7 fL — ABNORMAL HIGH (ref 80.0–100.0)
Platelets: 204 10*3/uL (ref 150–400)
RBC: 2.77 MIL/uL — ABNORMAL LOW (ref 4.22–5.81)
RDW: 12.4 % (ref 11.5–15.5)
WBC: 8.9 10*3/uL (ref 4.0–10.5)
nRBC: 0 % (ref 0.0–0.2)

## 2020-08-11 LAB — BASIC METABOLIC PANEL
Anion gap: 10 (ref 5–15)
BUN: 23 mg/dL (ref 8–23)
CO2: 22 mmol/L (ref 22–32)
Calcium: 9.1 mg/dL (ref 8.9–10.3)
Chloride: 98 mmol/L (ref 98–111)
Creatinine, Ser: 1.03 mg/dL (ref 0.61–1.24)
GFR, Estimated: >60 mL/min (ref >60)
Glucose, Bld: 87 mg/dL (ref 70–99)
Potassium: 3.6 mmol/L (ref 3.5–5.1)
Sodium: 130 mmol/L — ABNORMAL LOW (ref 135–145)

## 2020-08-11 LAB — MAGNESIUM: Magnesium: 1.8 mg/dL (ref 1.7–2.4)

## 2020-08-11 LAB — SEDIMENTATION RATE: Sed Rate: 106 mm/hr — ABNORMAL HIGH (ref 0–20)

## 2020-08-11 MED ORDER — AZELASTINE HCL 0.1 % NA SOLN
1.0000 | Freq: Two times a day (BID) | NASAL | Status: DC
  Administered 2020-08-11 – 2020-08-18 (×14): 1 via NASAL
  Filled 2020-08-11: qty 30

## 2020-08-11 MED ORDER — TRETINOIN 0.05 % EX CREA: 1.0000 "application " | TOPICAL_CREAM | Freq: Every day | CUTANEOUS | Status: DC

## 2020-08-11 MED ORDER — FUROSEMIDE 40 MG PO TABS
40.0000 mg | ORAL_TABLET | Freq: Every day | ORAL | Status: DC
  Administered 2020-08-12 – 2020-08-18 (×7): 40 mg via ORAL
  Filled 2020-08-11 (×7): qty 1

## 2020-08-11 MED ORDER — TRIAMCINOLONE ACETONIDE 0.1 % EX CREA
1.0000 "application " | TOPICAL_CREAM | Freq: Two times a day (BID) | CUTANEOUS | Status: DC
  Administered 2020-08-11 – 2020-08-17 (×12): 1 via TOPICAL
  Filled 2020-08-11: qty 15

## 2020-08-11 NOTE — Consult Note (Signed)
ORTHOPAEDIC CONSULTATION  REQUESTING PHYSICIAN: Ezekiel Slocumb, DO  Chief Complaint: Left heel ulcer with swelling and redness  HPI: [name] is a 85 y.o. male who complains of worsening redness swelling of his left heel.  Admitted approximately 2 days ago with swelling and redness to his left heel.  Found to have bacteremia.  Infectious disease noted ulcer to the left foot and I was asked to evaluate for debridement.  MRI was performed and was negative for osteomyelitis.  It did show cellulitis to the left ankle.  No drainable abscess on MRI.Marland Kitchen Longstanding history of diabetes.  Has undergone previous great toe amputation in the past.  Past Medical History:  Diagnosis Date  . Arthritis   . Benign essential tremor   . Fracture, femur ([place]) 2012  . GERD (gastroesophageal reflux disease)   . H/O echocardiogram    05/2013  . Hx of colonoscopy    04/22/2013  . Hypercholesterolemia   . Hypertension   . Myocardial infarction ([place])    1987  . Polyneuropathy   . Prostate cancer ([place]) 2004  . Superficial hematoma    Past Surgical History:  Procedure Laterality Date  . BASAL CELL CARCINOMA EXCISION    . COLONOSCOPY  05/01/2009   Positive for colonic polyps  . COLONOSCOPY WITH PROPOFOL N/A 02/24/2016   Procedure: COLONOSCOPY WITH PROPOFOL;  Surgeon: Christene Lye, MD;  Location: [facility] ENDOSCOPY;  Service: Endoscopy;  Laterality: N/A;  . CORONARY ARTERY BYPASS GRAFT  1987   X2  . HERNIA REPAIR  1990  . PROSTATE SURGERY  2004   Social History   Socioeconomic History  . Marital status: Married    Spouse name: Not on file  . Number of children: Not on file  . Years of education: Not on file  . Highest education level: Not on file  Occupational History  . Not on file  Tobacco Use  . Smoking status: Never Smoker  . Smokeless tobacco: Never Used  Vaping Use  . Vaping Use: Never used  Substance and Sexual Activity  . Alcohol use: Yes    Alcohol/week: 7.0 standard  drinks    Types: 7 Glasses of wine per week    Comment: wine in the evening with dinner  . Drug use: No  . Sexual activity: Not on file  Other Topics Concern  . Not on file  Social History Narrative  . Not on file   Social Determinants of Health   Financial Resource Strain: Not on file  Food Insecurity: Not on file  Transportation Needs: Not on file  Physical Activity: Not on file  Stress: Not on file  Social Connections: Not on file   Family History  Problem Relation Age of Onset  . Heart disease Mother   . Congestive Heart Failure Father    Allergies  Allergen Reactions  . Codeine Nausea And Vomiting  . Indocin [Indomethacin] Other (See Comments)  . Latex Other (See Comments)    Blisters  . Mupirocin     Blisters  . Plavix [Clopidogrel Bisulfate]     GI intolerance, n/v  . Polysporin [Bacitracin-Polymyxin B]     Blisters  . Shellfish Allergy   . Tape   . Neosporin [Neomycin-Bacitracin Zn-Polymyx] Rash  . Other Nausea And Vomiting    mussels   Prior to Admission medications   Medication Sig Start Date End Date Taking? Authorizing Provider  Alpha Lipoic Acid 200 MG CAPS Take 2 capsules by mouth daily.   Yes [provider]  amLODipine (NORVASC) 2.5 MG tablet TAKE 1 TABLET BY MOUTH  DAILY Patient taking differently: Take 2.5 mg by mouth daily. 02/23/20  Yes Karamalegos, Alexander J, DO  Apoaequorin 10 MG CAPS Take 1 capsule by mouth as directed.   Yes [provider]  aspirin 81 MG tablet Take 162 mg by mouth at bedtime.   Yes [provider]  azelastine (ASTELIN) 0.1 % nasal spray Place 1 spray into both nostrils 2 (two) times daily. Use in each nostril as directed 12/23/19  Yes Karamalegos, Devonne Doughty, DO  B Complex Vitamins (VITAMIN-B COMPLEX PO) Take 1 tablet by mouth daily.   Yes [provider]  Biotin 1000 MCG tablet Take 1,000 mcg by mouth daily.   Yes [provider]  Calcium Carb-Cholecalciferol 500-100 MG-UNIT  CHEW Chew 500 mg by mouth daily.   Yes [provider]  cyanocobalamin 1000 MCG tablet Take 1,000 mcg by mouth daily.   Yes [provider]  diphenhydrAMINE (BENADRYL) 25 MG tablet Take 25 mg by mouth at bedtime as needed for allergies.   Yes [provider]  folic acid (FOLVITE) 1 MG tablet Take 1 mg by mouth daily.   Yes [provider]  furosemide (LASIX) 40 MG tablet Take 1 tablet (40 mg total) by mouth daily. 06/24/20  Yes Karamalegos, Devonne Doughty, DO  gabapentin (NEURONTIN) 600 MG tablet TAKE 2 TABLETS BY MOUTH  TWICE DAILY Patient taking differently: Take 1,200 mg by mouth 2 (two) times daily. 02/23/20  Yes Karamalegos, Devonne Doughty, DO  Glucosamine Sulfate 1000 MG CAPS Take 1 capsule by mouth daily.   Yes [provider]  hydrochlorothiazide (HYDRODIURIL) 25 MG tablet TAKE 1 TABLET BY MOUTH  TWICE DAILY Patient taking differently: Take 25 mg by mouth 2 (two) times daily. (in the morning and about midday) 07/05/20  Yes Karamalegos, Devonne Doughty, DO  Lecithin 400 MG CAPS Take 400 mg by mouth daily.   Yes [provider]  losartan (COZAAR) 50 MG tablet TAKE 1 TABLET BY MOUTH  DAILY Patient taking differently: Take 50 mg by mouth at bedtime. 02/24/20  Yes Karamalegos, Alexander J, DO  melatonin 5 MG TABS Take 5 mg by mouth at bedtime.   Yes [provider]  Omega 3 1000 MG CAPS Take 1,000 mg by mouth daily.   Yes [provider]  omeprazole (PRILOSEC) 20 MG capsule TAKE 1 CAPSULE BY MOUTH  TWICE DAILY BEFORE MEALS Patient taking differently: Take 20 mg by mouth 2 (two) times daily before a meal. 02/23/20  Yes Karamalegos, Devonne Doughty, DO  potassium chloride SA (KLOR-CON) 20 MEQ tablet Take 1 tablet (20 mEq total) by mouth 2 (two) times daily. 12/19/19  Yes Karamalegos, Devonne Doughty, DO  pravastatin (PRAVACHOL) 80 MG tablet TAKE 1 TABLET BY MOUTH  DAILY Patient taking differently: Take 80 mg by mouth at bedtime. 02/23/20  Yes  Karamalegos, Devonne Doughty, DO  primidone (MYSOLINE) 50 MG tablet TAKE 1 TABLET BY MOUTH 3  TIMES DAILY Patient taking differently: Take 50-100 mg by mouth See admin instructions. Take 2 tablets (100mg ) by mouth every morning and take 1 tablet (50mg ) by mouth every night 02/24/20  Yes Karamalegos, Devonne Doughty, DO  Selenium 200 MCG CAPS Take 200 mcg by mouth daily.   Yes [provider]  tretinoin (RETIN-A) 0.05 % cream Apply 1 application topically at bedtime.   Yes [provider]  triamcinolone (KENALOG) 0.1 % Apply 1 application topically 2 (two) times daily. (  apply to hands, legs and feet) 07/27/20  Yes [provider]   MR ANKLE LEFT W WO CONTRAST  Result Date: 08/09/2020 CLINICAL DATA:  Left leg swelling and redness, question of osteomyelitis EXAM: MRI OF THE LEFT ANKLE WITHOUT AND WITH CONTRAST TECHNIQUE: Multiplanar, multisequence MR imaging of the ankle was performed before and after the administration of intravenous contrast. CONTRAST:  35mL GADAVIST GADOBUTROL 1 MMOL/ML IV SOLN COMPARISON:  Radiograph same day FINDINGS: Bones/Joint/Cartilage Osteoarthritis is seen at the first medial cuneiform metatarsal joint with joint space loss and subchondral cystic changes. Minimally increased signal seen within the lateral malleolus. No areas of cortical destruction or periosteal reaction. No areas of abnormal enhancement are seen throughout the osseous structures. Tibiotalar joint osteoarthritis is seen with joint space loss and diffuse chondral loss with subchondral marrow signal changes seen anteriorly. A small ankle joint effusion is seen. Ligaments Attenuation with increased signal seen within the anterior talofibular ligament. There is also slight thickening of the spring ligament, however it is intact. The remainder of the ligaments appear to be intact. Muscles and Tendons Mildly increased signal seen within the multiple on the plantar surface. No focal atrophy or tear however  is noted. A small amount of fluid is seen surrounding the posterior tibialis and peroneal tendons, however they are intact. There is minimally increased signal seen at the insertion site of the Achilles tendon with mild adjacent reactive marrow. Soft tissue There is diffuse subcutaneous edema and skin thickening seen surrounding the ankle and dorsum of the hindfoot. No loculated fluid collections are noted. No areas of abnormal enhancement are seen. IMPRESSION: IMPRESSION 1. Findings suggestive of diffuse cellulitis surrounding the ankle and hindfoot. No evidence of soft tissue abscess or osteomyelitis 2. Intrasubstance sprain and partial tear of the anterior talofibular ligament, however there are intact fibers throughout 3. Small ankle joint effusion 4. 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 Mild insertional Achilles tendinosis Electronically Signed   By: Prudencio Pair M.D.   On: 08/09/2020 22:03   US Venous Img Lower Unilateral Left  Result Date: 08/09/2020 CLINICAL DATA:  Left lower extremity edema and pain. EXAM: LEFT LOWER EXTREMITY VENOUS DOPPLER ULTRASOUND TECHNIQUE: Gray-scale sonography with compression, as well as color and duplex ultrasound, were performed to evaluate the deep venous system(s) from the level of the common femoral vein through the popliteal and proximal calf veins. COMPARISON:  None. FINDINGS: VENOUS Normal compressibility of the common femoral, superficial femoral, and popliteal veins, as well as the visualized calf veins. Visualized portions of profunda femoral vein and great saphenous vein unremarkable. No filling defects to suggest DVT on grayscale or color Doppler imaging. Doppler waveforms show normal direction of venous flow, normal respiratory plasticity and response to augmentation. Limited views of the contralateral common femoral vein are unremarkable. OTHER None. Limitations: none IMPRESSION: Negative. Electronically Signed   By: Fidela Salisbury M.D.   On: 08/09/2020 16:47   DG Foot 2 Views Left  Result Date: 08/09/2020 CLINICAL DATA:  left leg redness EXAM: LEFT FOOT - 2 VIEW COMPARISON:  MRI left foot 02/17/2016 FINDINGS: First and second digit distal phalangeal amputation. No cortical erosion or destruction. There is no evidence of fracture or dislocation. There is no evidence of severe arthropathy or other focal bone abnormality. Subcutaneus soft tissue edema. Vascular calcifications. IMPRESSION: 1. 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 2. No acute displaced fracture or dislocation of the bones of the  left foot. Electronically Signed   By: Iven Finn M.D.   On: 08/09/2020 18:30    Positive ROS: All other systems have been reviewed and were otherwise negative with the exception of those mentioned in the HPI and as above.  12 point ROS was performed.  Physical Exam: General: Alert and oriented.  No apparent distress.  Vascular:  Left foot:Dorsalis Pedis:  present Posterior Tibial:  present  Right foot: Dorsalis Pedis:  present Posterior Tibial:  present  Neuro: Absent protective sensation or gross sensation  Derm: Right foot without ulceration.  Left foot and ankle with diffuse cellulitis and erythema.  Noted plantar heel ulceration but worsening area of prenecrotic tissue laterally.  Obvious purulent drainage from this wound at this time.  Ortho/MS: Severe diffuse edema to the left lower extremity at the foot and ankle.    Assessment: Abscess plantar left foot and lateral heel region Staph aureus bacteremia  Polyneuropathy  Plan: He has an obvious abscess to the plantar and lateral aspect of the left heel.  A deep wound culture was performed by myself today.  He will definitely need this I indeed and debrided.  We will plan for surgery tomorrow.  I discussed this with the patient and he was amenable to this.  Orders will be  placed.  I did review the MRI.  No signs of obvious osteomyelitis at this time.  He does have palpable pulses as well.    Elesa Hacker, DPM Cell [phone]   08/11/2020 12:31 PM

## 2020-08-11 NOTE — Progress Notes (Signed)
Pharmacy Antibiotic Note  [name] is a 85 y.o. male admitted on 08/09/2020 with sepsis and wound infection of L foot. Subsequently found to have MRSA bacteremia. TTE pending. ID and podiatry following. Pharmacy has been consulted for vancomycin and cefepime dosing.  Patient previously diagnosed with osteomyelitis on L foot and cultures that grew MRSA and PSA  Plan:  Continue cefepime 2g IV q8h  Continue vancomycin 1 g IV q12h --Predicted AUC: 527, Cmin 16.4 --Vancomycin peak ordered for 3/2 at 0400 --Vancomycin trough ordered for 3/2 at 1200 --Daily Scr per protocol while on vancomycin   Height: 6' (182.9 cm) Weight: 96.2 kg (212 lb 1.3 oz) IBW/kg (Calculated) : 77.6  Recent Labs  Lab 08/09/20 1549 08/09/20 1551 08/09/20 1804 08/09/20 2224 08/10/20 0503 08/11/20 0709  WBC  --  14.6*  --   --  11.3* 8.9  CREATININE  --  0.83  --  1.02 0.88 1.03  LATICACIDVEN 1.1  --  1.0  --   --   --     Estimated Creatinine Clearance: 66.5 mL/min (by C-G formula based on SCr of 1.03 mg/dL).     Antimicrobials this admission: Metronidazole 2/27 x 1 Vancomycin 2/27> Cefepime 2/27  Microbiology results: 2/27 BCx: 3/4 bottles Staphylococcus aureus; BCID detected methicillin resistance 2/28 BCx: NGTD 2/28 MRSA PCR: (+) 3/1 Foot culture (L foot): pending   Benita Gutter  08/11/2020   6:28 PM

## 2020-08-11 NOTE — Progress Notes (Addendum)
PROGRESS NOTE    [name]   YQI:[id number]  DOB: 10-05-1935  PCP: [name], DO    DOA: 08/09/2020 LOS: 1   Brief Narrative   Summary of HPI on admission: 85 y.o. male with medical history significant for idiopathic peripheral neuropathy, hypertension, hyperlipidemia, GERD, presented to the ED on 08/09/2020 for chief concerns of worsening left leg pain since 2/24.  No preceding trauma.  No fevers or chills.  ED course: Temp 100.39F, tachycardic HR 106, respirations 23.  Labs notable for hyponatremia 125, hypokalemia 2.7, hypochloremia 86, hypomagnesemia 1.5, leukocytosis 14.6, mild anemia hemoglobin 12.7 macrocytic.  Negative COVID-19 and influenza's on PCR.  UA with moderate hemoglobin but negative for infection.  Blood cultures were drawn, returned positive for MRSA.  Hospital course to date: Admitted to hospitalist service for further evaluation and management of sepsis due to left foot cellulitis and multiple electrolyte derangements.  Left lower extremity venous US negative for DVT. 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 MRI left ankle negative for signs of abscess or osteomyelitis, but showed diffuse cellulitis of the ankle and hindfoot, sprain and partial tear of the ATFL and small joint effusion.  On broad-spectrum antibiotic coverage with vancomycin and cefepime.  Infectious disease consulted.   Assessment & Plan   Active Problems:   Sepsis ([place])   MRSA bacteremia    Sepsis secondary to MRSA bacteremia and Left Foot Cellulitis with abscess Sepsis POA as evidenced by leukocytosis, tachycardia, tachypnea.  Treated per protocol in the ED.  Continued on broad spectrum antibiotics.   --ID consulted --Continue Vanc and Cefepime --Podiatry consulted for debridement of left heel thick callus with drainage --Podiatry taking to OR tomorrow for I&D of abscess on  lateral aspect of the heel --Needs TEE  Hypokalemia - suspect secondary to diuretics Replaced.  Hold hydrochlorothiazide and Lasix, resume once K within normal and maintain adequate replacement  Hyponatremia - POA, improving.  Suspect due to poor PO intake.  Monitor BMP.  Chronic systolic CHF / CAD / MR / TR - all appear stable.  Monitor volume status, renal function, electrolytes --Continue ASA --Lasix was on hold, resume tomorrow  Hypotension POA / Hx of Hypertension -resumed amlodipine 2.5 mg -Held: home HCTZ, Lasix -Hydralazine PRN  Hyperlipidemia-pravastatin 80 mg daily  Idiopathic peripheral neuropathy-resumed gabapentin 1200 mg twice daily p.o.  Insomnia-melatonin 5 mg daily at bedtime  GERD-PPI   DVT prophylaxis: enoxaparin (LOVENOX) injection 40 mg Start: 08/09/20 2000 Place TED hose Start: 08/09/20 1758   Diet:  Diet Orders (From admission, onward)    Start     Ordered   08/12/20 0430  Diet NPO time specified  Diet effective ____        08/11/20 1239   08/09/20 1759  Diet Heart Room service appropriate? Yes; Fluid consistency: Thin  Diet effective now       Question Answer Comment  Room service appropriate? Yes   Fluid consistency: Thin      08/09/20 1759            Code Status: Full Code    Subjective 08/11/20    Pt seen today, wife entered later during encounter.  Pt feels well.  Denies fever/chills.  Foot and leg feel little better.  No acute complaints.    Disposition Plan & Communication   Status is: Inpatient  Inpatient status appropriate due to severity of illness on IV antibiotics for MRSA  bacteremia.  Dispo: The patient is from: Home              Anticipated d/c is to: likely home              Patient currently is NOT medically stable for d/c.   Difficult to place patient No   Family Communication: wife at bedside on rounds   Consults, Procedures, Significant Events   Consultants:   Infectious  Disease  Podiatry  Procedures:   None  Antimicrobials:  Anti-infectives (From admission, onward)   Start     Dose/Rate Route Frequency Ordered Stop   08/10/20 1100  vancomycin (VANCOREADY) IVPB 1000 mg/200 mL        1,000 mg 200 mL/hr over 60 Minutes Intravenous Every 12 hours 08/09/20 1905     08/09/20 2345  ceFEPIme (MAXIPIME) 2 g in sodium chloride 0.9 % 100 mL IVPB  Status:  Discontinued        2 g 200 mL/hr over 30 Minutes Intravenous Every 8 hours 08/09/20 1852 08/09/20 1854   08/09/20 2345  ceFEPIme (MAXIPIME) 2 g in sodium chloride 0.9 % 100 mL IVPB        2 g 200 mL/hr over 30 Minutes Intravenous Every 8 hours 08/09/20 1854     08/09/20 1930  vancomycin (VANCOCIN) IVPB 1000 mg/200 mL premix  Status:  Discontinued        1,000 mg 200 mL/hr over 60 Minutes Intravenous  Once 08/09/20 1852 08/09/20 1854   08/09/20 1930  vancomycin (VANCOCIN) IVPB 1000 mg/200 mL premix        1,000 mg 200 mL/hr over 60 Minutes Intravenous  Once 08/09/20 1854 08/10/20 0020   08/09/20 1600  ceFEPIme (MAXIPIME) 2 g in sodium chloride 0.9 % 100 mL IVPB        2 g 200 mL/hr over 30 Minutes Intravenous  Once 08/09/20 1555 08/09/20 1636   08/09/20 1600  metroNIDAZOLE (FLAGYL) IVPB 500 mg        500 mg 100 mL/hr over 60 Minutes Intravenous  Once 08/09/20 1555 08/09/20 1750   08/09/20 1600  vancomycin (VANCOCIN) IVPB 1000 mg/200 mL premix        1,000 mg 200 mL/hr over 60 Minutes Intravenous  Once 08/09/20 1555 08/09/20 1926        Micro    Objective   Vitals:   08/11/20 0443 08/11/20 0800 08/11/20 1239 08/11/20 1645  BP: (!) 137/123 128/79 134/73 125/67  Pulse: 66 65 76 73  Resp: 16 17 18 17   Temp: 99.1 F (37.3 C) (!) 97.5 F (36.4 C) 98.2 F (36.8 C) 99.4 F (37.4 C)  TempSrc: Oral Oral Oral Oral  SpO2: 99% 100% 99% 92%  Weight: 96.2 kg     Height:        Intake/Output Summary (Last 24 hours) at 08/11/2020 1834 Last data filed at 08/11/2020 1637 Gross per 24 hour  Intake --   Output 1325 ml  Net -1325 ml   Filed Weights   08/09/20 1545 08/10/20 0336 08/11/20 0443  Weight: 91 kg 95.6 kg 96.2 kg    Physical Exam:  General exam: awake, alert, no acute distress Respiratory system: symmetric chest rise, normal respiratory effort, on room air. Cardiovascular system: RRR, no RLE edema, LLE edema due to cellulitis.   Central nervous system: A&O x3. no gross focal neurologic deficits, normal speech Extremities: left foot dressed, and lower leg improvement in the intensity of erythema, less LLE swelling today, erythema beginning  to recede.  RLE without edema. Psychiatry: normal mood, congruent affect, judgement and insight appear normal  Labs   Data Reviewed: I have personally reviewed following labs and imaging studies  CBC: Recent Labs  Lab 08/09/20 1551 08/10/20 0503 08/11/20 0709  WBC 14.6* 11.3* 8.9  NEUTROABS 13.3*  --   --   HGB 12.7* 9.7* 10.4*  HCT 35.2* 27.4* 29.0*  MCV 101.7* 102.2* 104.7*  PLT 263 207 222   Basic Metabolic Panel: Recent Labs  Lab 08/09/20 1551 08/09/20 2224 08/10/20 0503 08/11/20 0709  NA 125* 126* 127* 130*  K 2.7* 3.3* 2.9* 3.6  CL 86* 91* 92* 98  CO2 26 25 24 22   GLUCOSE 122* 135* 107* 87  BUN 18 18 19 23   CREATININE 0.83 1.02 0.88 1.03  CALCIUM 9.7 9.2 8.8* 9.1  MG 1.5*  --   --  1.8   GFR: Estimated Creatinine Clearance: 66.5 mL/min (by C-G formula based on SCr of 1.03 mg/dL). Liver Function Tests: Recent Labs  Lab 08/09/20 1551  AST 17  ALT 13  ALKPHOS 61  BILITOT 1.1  PROT 8.4*  ALBUMIN 4.2   No results for input(s): LIPASE, AMYLASE in the last 168 hours. No results for input(s): AMMONIA in the last 168 hours. Coagulation Profile: No results for input(s): INR, PROTIME in the last 168 hours. Cardiac Enzymes: No results for input(s): CKTOTAL, CKMB, CKMBINDEX, TROPONINI in the last 168 hours. BNP (last 3 results) No results for input(s): PROBNP in the last 8760 hours. HbA1C: No results for  input(s): HGBA1C in the last 72 hours. CBG: No results for input(s): GLUCAP in the last 168 hours. Lipid Profile: No results for input(s): CHOL, HDL, LDLCALC, TRIG, CHOLHDL, LDLDIRECT in the last 72 hours. Thyroid Function Tests: Recent Labs    08/09/20 1804  TSH 0.270*   Anemia Panel: No results for input(s): VITAMINB12, FOLATE, FERRITIN, TIBC, IRON, RETICCTPCT in the last 72 hours. Sepsis Labs: Recent Labs  Lab 08/09/20 1549 08/09/20 1804  LATICACIDVEN 1.1 1.0    Recent Results (from the past 240 hour(s))  Blood culture (routine x 2)     Status: Abnormal (Preliminary result)   Collection Time: 08/09/20  3:49 PM   Specimen: BLOOD  Result Value Ref Range Status   Specimen Description   Final    BLOOD BLOOD LEFT FOREARM Performed at [facility], [address], [place], [place] [address]    Special Requests   Final    BOTTLES DRAWN AEROBIC AND ANAEROBIC Blood Culture adequate volume Performed at [facility], [address] [place], [place] [address]    Culture  Setup Time   Final    GRAM POSITIVE COCCI AEROBIC BOTTLE ONLY CRITICAL RESULT CALLED TO, READ BACK BY AND VERIFIED WITH: NATHAN BELUE ON 08/10/20 AT 1740 QSD    Culture (A)  Final    STAPHYLOCOCCUS AUREUS SUSCEPTIBILITIES TO FOLLOW Performed at [facility], [place] [address] [place], [place] [address]    Report Status PENDING  Incomplete  Blood Culture ID Panel (Reflexed)     Status: Abnormal   Collection Time: 08/09/20  3:49 PM  Result Value Ref Range Status   Enterococcus faecalis NOT DETECTED NOT DETECTED Final   Enterococcus Faecium NOT DETECTED NOT DETECTED Final   Listeria monocytogenes NOT DETECTED NOT DETECTED Final   Staphylococcus species DETECTED (A) NOT DETECTED Final    Comment: CRITICAL RESULT CALLED TO, READ BACK BY AND VERIFIED WITH: NATHAN BELUE ON 08/10/20 AT  0616 QSD    Staphylococcus aureus (BCID) DETECTED (A) NOT DETECTED Final    Comment: Methicillin  (oxacillin)-resistant Staphylococcus aureus (MRSA). MRSA is predictably resistant to beta-lactam antibiotics (except ceftaroline). Preferred therapy is vancomycin unless clinically contraindicated. Patient requires contact precautions if  hospitalized. CRITICAL RESULT CALLED TO, READ BACK BY AND VERIFIED WITH: NATHAN BELUE ON 08/10/20 AT 0616 QSD    Staphylococcus epidermidis NOT DETECTED NOT DETECTED Final   Staphylococcus lugdunensis NOT DETECTED NOT DETECTED Final   Streptococcus species NOT DETECTED NOT DETECTED Final   Streptococcus agalactiae NOT DETECTED NOT DETECTED Final   Streptococcus pneumoniae NOT DETECTED NOT DETECTED Final   Streptococcus pyogenes NOT DETECTED NOT DETECTED Final   A.calcoaceticus-baumannii NOT DETECTED NOT DETECTED Final   Bacteroides fragilis NOT DETECTED NOT DETECTED Final   Enterobacterales NOT DETECTED NOT DETECTED Final   Enterobacter cloacae complex NOT DETECTED NOT DETECTED Final   Escherichia coli NOT DETECTED NOT DETECTED Final   Klebsiella aerogenes NOT DETECTED NOT DETECTED Final   Klebsiella oxytoca NOT DETECTED NOT DETECTED Final   Klebsiella pneumoniae NOT DETECTED NOT DETECTED Final   Proteus species NOT DETECTED NOT DETECTED Final   Salmonella species NOT DETECTED NOT DETECTED Final   Serratia marcescens NOT DETECTED NOT DETECTED Final   Haemophilus influenzae NOT DETECTED NOT DETECTED Final   Neisseria meningitidis NOT DETECTED NOT DETECTED Final   Pseudomonas aeruginosa NOT DETECTED NOT DETECTED Final   Stenotrophomonas maltophilia NOT DETECTED NOT DETECTED Final   Candida albicans NOT DETECTED NOT DETECTED Final   Candida auris NOT DETECTED NOT DETECTED Final   Candida glabrata NOT DETECTED NOT DETECTED Final   Candida krusei NOT DETECTED NOT DETECTED Final   Candida parapsilosis NOT DETECTED NOT DETECTED Final   Candida tropicalis NOT DETECTED NOT DETECTED Final   Cryptococcus neoformans/gattii NOT DETECTED NOT DETECTED Final   Meth  resistant mecA/C and MREJ DETECTED (A) NOT DETECTED Final    Comment: CRITICAL RESULT CALLED TO, READ BACK BY AND VERIFIED WITH: NATHAN BELUE ON 08/10/20 AT 8676 QSD Performed at [facility], [address] [place], [place] [address]   Blood culture (routine x 2)     Status: Abnormal (Preliminary result)   Collection Time: 08/09/20  3:50 PM   Specimen: BLOOD  Result Value Ref Range Status   Specimen Description   Final    BLOOD RIGHT ANTECUBITAL Performed at [facility], [address] [place], [place] [address]    Special Requests   Final    BOTTLES DRAWN AEROBIC AND ANAEROBIC Blood Culture adequate volume Performed at [facility], [address] [place], [place] [address]    Culture  Setup Time   Final    GRAM POSITIVE COCCI IN BOTH AEROBIC AND ANAEROBIC BOTTLES CRITICAL VALUE NOTED.  VALUE IS CONSISTENT WITH PREVIOUSLY REPORTED AND CALLED VALUE. Performed at [facility], [address], [place], [place] [address]    Culture STAPHYLOCOCCUS AUREUS (A)  Final   Report Status PENDING  Incomplete  Resp Panel by RT-PCR (Flu A&B, Covid) Nasopharyngeal Swab     Status: None   Collection Time: 08/09/20  4:10 PM   Specimen: Nasopharyngeal Swab; Nasopharyngeal(NP) swabs in vial transport medium  Result Value Ref Range Status   SARS Coronavirus 2 by RT PCR NEGATIVE NEGATIVE Final    Comment: (NOTE) SARS-CoV-2 target nucleic acids are NOT DETECTED.  The SARS-CoV-2 RNA is generally detectable in upper respiratory specimens during the acute phase of infection. The lowest concentration of SARS-CoV-2  viral copies this assay can detect is 138 copies/mL. A negative result does not preclude SARS-Cov-2 infection and should not be used as the sole basis for treatment or other patient management decisions. A negative result may occur with  improper specimen collection/handling, submission of specimen other than nasopharyngeal swab, presence of viral  mutation(s) within the areas targeted by this assay, and inadequate number of viral copies(<138 copies/mL). A negative result must be combined with clinical observations, patient history, and epidemiological information. The expected result is Negative.  Fact Sheet for Patients:  EntrepreneurPulse.com.au  Fact Sheet for Healthcare Providers:  IncredibleEmployment.be  This test is no t yet approved or cleared by the [place] FDA and  has been authorized for detection and/or diagnosis of SARS-CoV-2 by FDA under an Emergency Use Authorization (EUA). This EUA will remain  in effect (meaning this test can be used) for the duration of the COVID-19 declaration under Section 564(b)(1) of the Act, 21 U.S.C.section 360bbb-3(b)(1), unless the authorization is terminated  or revoked sooner.       Influenza A by PCR NEGATIVE NEGATIVE Final   Influenza B by PCR NEGATIVE NEGATIVE Final    Comment: (NOTE) The Xpert Xpress SARS-CoV-2/FLU/RSV plus assay is intended as an aid in the diagnosis of influenza from Nasopharyngeal swab specimens and should not be used as a sole basis for treatment. Nasal washings and aspirates are unacceptable for Xpert Xpress SARS-CoV-2/FLU/RSV testing.  Fact Sheet for Patients: EntrepreneurPulse.com.au  Fact Sheet for Healthcare Providers: IncredibleEmployment.be  This test is not yet approved or cleared by the [place] FDA and has been authorized for detection and/or diagnosis of SARS-CoV-2 by FDA under an Emergency Use Authorization (EUA). This EUA will remain in effect (meaning this test can be used) for the duration of the COVID-19 declaration under Section 564(b)(1) of the Act, 21 U.S.C. section 360bbb-3(b)(1), unless the authorization is terminated or revoked.  Performed at [facility], [address] [place], [place] [address]   MRSA PCR Screening     Status:  Abnormal   Collection Time: 08/10/20 10:50 AM   Specimen: Nasal Mucosa; Nasopharyngeal  Result Value Ref Range Status   MRSA by PCR POSITIVE (A) NEGATIVE Final    Comment:        The GeneXpert MRSA Assay (FDA approved for NASAL specimens only), is one component of a comprehensive MRSA colonization surveillance program. It is not intended to diagnose MRSA infection nor to guide or monitor treatment for MRSA infections. RESULT CALLED TO, READ BACK BY AND VERIFIED WITH: Jonnie Kind 08/10/20 AT 1257 BY ACR Performed at [facility], [address] [place], [place] [address]   CULTURE, BLOOD (ROUTINE X 2) w Reflex to ID Panel     Status: None (Preliminary result)   Collection Time: 08/10/20 11:55 PM   Specimen: BLOOD  Result Value Ref Range Status   Specimen Description BLOOD BLOOD LEFT HAND  Final   Special Requests   Final    BOTTLES DRAWN AEROBIC AND ANAEROBIC Blood Culture adequate volume   Culture   Final    NO GROWTH < 12 HOURS Performed at [facility], [address] [place], [place] [address]    Report Status PENDING  Incomplete  CULTURE, BLOOD (ROUTINE X 2) w Reflex to ID Panel     Status: None (Preliminary result)   Collection Time: 08/10/20 11:55 PM   Specimen: BLOOD  Result Value Ref Range Status   Specimen Description BLOOD BLOOD RIGHT HAND  Final  Special Requests   Final    BOTTLES DRAWN AEROBIC AND ANAEROBIC Blood Culture adequate volume   Culture   Final    NO GROWTH < 12 HOURS Performed at [facility], [address] [place], [place] [address]    Report Status PENDING  Incomplete      Imaging Studies   MR ANKLE LEFT W WO CONTRAST  Result Date: 08/09/2020 CLINICAL DATA:  Left leg swelling and redness, question of osteomyelitis EXAM: MRI OF THE LEFT ANKLE WITHOUT AND WITH CONTRAST TECHNIQUE: Multiplanar, multisequence MR imaging of the ankle was performed before and after the administration of intravenous contrast.  CONTRAST:  55mL GADAVIST GADOBUTROL 1 MMOL/ML IV SOLN COMPARISON:  Radiograph same day FINDINGS: Bones/Joint/Cartilage Osteoarthritis is seen at the first medial cuneiform metatarsal joint with joint space loss and subchondral cystic changes. Minimally increased signal seen within the lateral malleolus. No areas of cortical destruction or periosteal reaction. No areas of abnormal enhancement are seen throughout the osseous structures. Tibiotalar joint osteoarthritis is seen with joint space loss and diffuse chondral loss with subchondral marrow signal changes seen anteriorly. A small ankle joint effusion is seen. Ligaments Attenuation with increased signal seen within the anterior talofibular ligament. There is also slight thickening of the spring ligament, however it is intact. The remainder of the ligaments appear to be intact. Muscles and Tendons Mildly increased signal seen within the multiple on the plantar surface. No focal atrophy or tear however is noted. A small amount of fluid is seen surrounding the posterior tibialis and peroneal tendons, however they are intact. There is minimally increased signal seen at the insertion site of the Achilles tendon with mild adjacent reactive marrow. Soft tissue There is diffuse subcutaneous edema and skin thickening seen surrounding the ankle and dorsum of the hindfoot. No loculated fluid collections are noted. No areas of abnormal enhancement are seen. IMPRESSION: IMPRESSION 1. Findings suggestive of diffuse cellulitis surrounding the ankle and hindfoot. No evidence of soft tissue abscess or osteomyelitis 2. Intrasubstance sprain and partial tear of the anterior talofibular ligament, however there are intact fibers throughout 3. Small ankle joint effusion 4. 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 Mild insertional Achilles tendinosis Electronically Signed   By: Prudencio Pair M.D.   On: 08/09/2020 22:03     Medications   Scheduled Meds: .  amLODipine  2.5 mg Oral Daily  . aspirin EC  81 mg Oral Daily  . azelastine  1 spray Each Nare BID  . B-complex with vitamin C  1 tablet Oral Daily  . calcium citrate-vitamin D  1 tablet Oral Daily  . Chlorhexidine Gluconate Cloth  6 each Topical Q0600  . enoxaparin (LOVENOX) injection  40 mg Subcutaneous Q24H  . folic acid  1 mg Oral Daily  . gabapentin  1,200 mg Oral BID  . melatonin  5 mg Oral QHS  . pantoprazole  40 mg Oral Daily  . pravastatin  80 mg Oral Daily  . primidone  50 mg Oral TID  . tretinoin  1 application Topical QHS  . triamcinolone  1 application Topical BID  . cyanocobalamin  1,000 mcg Oral Daily   Continuous Infusions: . sodium chloride Stopped (08/10/20 1753)  . ceFEPime (MAXIPIME) IV 2 g (08/11/20 1637)  . vancomycin 1,000 mg (08/11/20 1241)       LOS: 1 day    Time spent: 25 minutes with > 50% spent at bedside and in coordination of care.    Ezekiel Slocumb,  DO [facility]  08/11/2020, 6:34 PM      If 7PM-7AM, please contact night-coverage. How to contact the [facility] Attending or Consulting provider [place] or covering provider during after hours [place], for this patient?    1. Check the care team in [facility] and look for a) attending/consulting [facility] provider listed and b) the [facility] team listed 2. Log into [email or web address] and use [place]'s universal password to access. If you do not have the password, please contact the hospital operator. 3. Locate the [facility] provider you are looking for under [facility] and page to a number that you can be directly reached. 4. If you still have difficulty reaching the provider, please page the [facility] (Director on Call) for the Hospitalists listed on amion for assistance.

## 2020-08-11 NOTE — Progress Notes (Signed)
Date of Admission:  08/09/2020      ID: [name] is a 85 y.o. male  Active Problems:   Sepsis ([place])   MRSA bacteremia    Subjective: Pt doing better Seen by Dr.Fowler and underwent debridement  Medications:  . amLODipine  2.5 mg Oral Daily  . aspirin EC  81 mg Oral Daily  . azelastine  1 spray Each Nare BID  . B-complex with vitamin C  1 tablet Oral Daily  . calcium citrate-vitamin D  1 tablet Oral Daily  . Chlorhexidine Gluconate Cloth  6 each Topical Q0600  . enoxaparin (LOVENOX) injection  40 mg Subcutaneous Q24H  . folic acid  1 mg Oral Daily  . gabapentin  1,200 mg Oral BID  . melatonin  5 mg Oral QHS  . pantoprazole  40 mg Oral Daily  . pravastatin  80 mg Oral Daily  . primidone  50 mg Oral TID  . tretinoin  1 application Topical QHS  . triamcinolone  1 application Topical BID  . cyanocobalamin  1,000 mcg Oral Daily    Objective: Vital signs in last 24 hours: Temp:  [97.5 F (36.4 C)-99.4 F (37.4 C)] 99.4 F (37.4 C) (03/01 1645) Pulse Rate:  [65-85] 73 (03/01 1645) Resp:  [16-18] 17 (03/01 1645) BP: (125-137)/(65-123) 125/67 (03/01 1645) SpO2:  [92 %-100 %] 92 % (03/01 1645) Weight:  [96.2 kg] 96.2 kg (03/01 0443)  PHYSICAL EXAM:  General: Alert, cooperative, no distress,  Lungs: Clear to auscultation bilaterally. No Wheezing or Rhonchi. No rales. Heart: Regular rate and rhythm, no murmur, rub or gallop. Abdomen: Soft, non-tender,not distended. Bowel sounds normal. No masses Extremities: Edema and erythema better   left foot wound picture evaluated        Skin: No rashes or lesions. Or bruising Lymph: Cervical, supraclavicular normal. Neurologic: Grossly non-focal  Lab Results Recent Labs    08/10/20 0503 08/11/20 0709  WBC 11.3* 8.9  HGB 9.7* 10.4*  HCT 27.4* 29.0*  NA 127* 130*  K 2.9* 3.6  CL 92* 98  CO2 24 22  BUN 19 23  CREATININE 0.88 1.03   Liver Panel Recent Labs    08/09/20 1551  PROT 8.4*  ALBUMIN 4.2  AST 17   ALT 13  ALKPHOS 61  BILITOT 1.1   Sedimentation Rate Recent Labs    08/11/20 0709  ESRSEDRATE 106*   C-Reactive Protein Recent Labs    08/09/20 1804  CRP 20.6*    Microbiology:  Studies/Results: MR ANKLE LEFT W WO CONTRAST  Result Date: 08/09/2020 CLINICAL DATA:  Left leg swelling and redness, question of osteomyelitis EXAM: MRI OF THE LEFT ANKLE WITHOUT AND WITH CONTRAST TECHNIQUE: Multiplanar, multisequence MR imaging of the ankle was performed before and after the administration of intravenous contrast. CONTRAST:  52mL GADAVIST GADOBUTROL 1 MMOL/ML IV SOLN COMPARISON:  Radiograph same day FINDINGS: Bones/Joint/Cartilage Osteoarthritis is seen at the first medial cuneiform metatarsal joint with joint space loss and subchondral cystic changes. Minimally increased signal seen within the lateral malleolus. No areas of cortical destruction or periosteal reaction. No areas of abnormal enhancement are seen throughout the osseous structures. Tibiotalar joint osteoarthritis is seen with joint space loss and diffuse chondral loss with subchondral marrow signal changes seen anteriorly. A small ankle joint effusion is seen. Ligaments Attenuation with increased signal seen within the anterior talofibular ligament. There is also slight thickening of the spring ligament, however it is intact. The remainder of the ligaments appear to be intact.  Muscles and Tendons Mildly increased signal seen within the multiple on the plantar surface. No focal atrophy or tear however is noted. A small amount of fluid is seen surrounding the posterior tibialis and peroneal tendons, however they are intact. There is minimally increased signal seen at the insertion site of the Achilles tendon with mild adjacent reactive marrow. Soft tissue There is diffuse subcutaneous edema and skin thickening seen surrounding the ankle and dorsum of the hindfoot. No loculated fluid collections are noted. No areas of abnormal enhancement  are seen. IMPRESSION: IMPRESSION 1. Findings suggestive of diffuse cellulitis surrounding the ankle and hindfoot. No evidence of soft tissue abscess or osteomyelitis 2. Intrasubstance sprain and partial tear of the anterior talofibular ligament, however there are intact fibers throughout 3. Small ankle joint effusion 4. 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 Mild insertional Achilles tendinosis Electronically Signed   By: Prudencio Pair M.D.   On: 08/09/2020 22:03     Assessment/Plan: MRSA bacteremia--Repeat blood culture sent 2 d echo done today.  Pending. If it is negative then he will need TEE  Left foot cellulitis with left heel wound/callus with abscess underneath- Appreciate Dr.Fowler's recommendation Going for I/D tomorrow  Continue vanco and cefepime   Peripheral neuropathy  CAD status post CABG  On Pravachol and aspirin  Discussed the management with the patient and his wife.

## 2020-08-12 ENCOUNTER — Inpatient Hospital Stay: Payer: Medicare Other | Admitting: Registered Nurse

## 2020-08-12 ENCOUNTER — Encounter: Payer: Self-pay | Admitting: Internal Medicine

## 2020-08-12 ENCOUNTER — Encounter
Admission: EM | Disposition: A | Discharge status: Skilled Nursing Facility | Payer: Self-pay | Source: Home / Self Care | Attending: Obstetrics and Gynecology

## 2020-08-12 DIAGNOSIS — B9562 Methicillin resistant Staphylococcus aureus infection as the cause of diseases classified elsewhere: Secondary | ICD-10-CM | POA: Diagnosis not present

## 2020-08-12 DIAGNOSIS — R7881 Bacteremia: Secondary | ICD-10-CM | POA: Diagnosis not present

## 2020-08-12 HISTORY — PX: WOUND DEBRIDEMENT: SHX247

## 2020-08-12 HISTORY — PX: APPLICATION OF WOUND VAC: SHX5189

## 2020-08-12 LAB — ECHOCARDIOGRAM COMPLETE
AR max vel: 3.59 cm2
AV Area VTI: 3.53 cm2
AV Area mean vel: 3.64 cm2
AV Mean grad: 4 mmHg
AV Peak grad: 8 mmHg
Ao pk vel: 1.41 m/s
Area-P 1/2: 3.87 cm2
Height: 72 in
MV VTI: 3.56 cm2
S' Lateral: 3.8 cm
Single Plane A4C EF: 59.2 %
Weight: 3372.8 oz

## 2020-08-12 LAB — BASIC METABOLIC PANEL
Anion gap: 8 (ref 5–15)
BUN: 18 mg/dL (ref 8–23)
CO2: 24 mmol/L (ref 22–32)
Calcium: 9.2 mg/dL (ref 8.9–10.3)
Chloride: 97 mmol/L — ABNORMAL LOW (ref 98–111)
Creatinine, Ser: 0.89 mg/dL (ref 0.61–1.24)
GFR, Estimated: >60 mL/min (ref >60)
Glucose, Bld: 85 mg/dL (ref 70–99)
Potassium: 3.5 mmol/L (ref 3.5–5.1)
Sodium: 129 mmol/L — ABNORMAL LOW (ref 135–145)

## 2020-08-12 LAB — CBC
HCT: 28.5 % — ABNORMAL LOW (ref 39.0–52.0)
Hemoglobin: 10 g/dL — ABNORMAL LOW (ref 13.0–17.0)
MCH: 36.5 pg — ABNORMAL HIGH (ref 26.0–34.0)
MCHC: 35.1 g/dL (ref 30.0–36.0)
MCV: 104 fL — ABNORMAL HIGH (ref 80.0–100.0)
Platelets: 226 10*3/uL (ref 150–400)
RBC: 2.74 MIL/uL — ABNORMAL LOW (ref 4.22–5.81)
RDW: 12.1 % (ref 11.5–15.5)
WBC: 8.6 10*3/uL (ref 4.0–10.5)
nRBC: 0 % (ref 0.0–0.2)

## 2020-08-12 LAB — CULTURE, BLOOD (ROUTINE X 2)
Special Requests: ADEQUATE
Special Requests: ADEQUATE

## 2020-08-12 LAB — VANCOMYCIN, PEAK: Vancomycin Pk: 26 ug/mL — ABNORMAL LOW (ref 30–40)

## 2020-08-12 LAB — VANCOMYCIN, TROUGH: Vancomycin Tr: 23 ug/mL (ref 15–20)

## 2020-08-12 LAB — GLUCOSE, CAPILLARY: Glucose-Capillary: 121 mg/dL — ABNORMAL HIGH (ref 70–99)

## 2020-08-12 SURGERY — DEBRIDEMENT, WOUND
Anesthesia: General | Site: Heel | Laterality: Left

## 2020-08-12 MED ORDER — LIDOCAINE-EPINEPHRINE 1 %-1:100000 IJ SOLN
INTRAMUSCULAR | Status: AC
  Filled 2020-08-12: qty 1

## 2020-08-12 MED ORDER — LIDOCAINE HCL (CARDIAC) PF 100 MG/5ML IV SOSY
PREFILLED_SYRINGE | INTRAVENOUS | Status: DC | PRN
  Administered 2020-08-12: 80 mg via INTRAVENOUS

## 2020-08-12 MED ORDER — PROPOFOL 500 MG/50ML IV EMUL
INTRAVENOUS | Status: DC | PRN
  Administered 2020-08-12: 50 ug/kg/min via INTRAVENOUS

## 2020-08-12 MED ORDER — FENTANYL CITRATE (PF) 100 MCG/2ML IJ SOLN
25.0000 ug | INTRAMUSCULAR | Status: AC | PRN
  Administered 2020-08-12 (×4): 25 ug via INTRAVENOUS

## 2020-08-12 MED ORDER — FENTANYL CITRATE (PF) 100 MCG/2ML IJ SOLN
INTRAMUSCULAR | Status: AC
  Filled 2020-08-12: qty 2

## 2020-08-12 MED ORDER — LIDOCAINE-EPINEPHRINE 1 %-1:100000 IJ SOLN
INTRAMUSCULAR | Status: DC | PRN
  Administered 2020-08-12: 10 mL

## 2020-08-12 MED ORDER — BUPIVACAINE HCL 0.5 % IJ SOLN
INTRAMUSCULAR | Status: DC | PRN
  Administered 2020-08-12: 10 mL

## 2020-08-12 MED ORDER — FENTANYL CITRATE (PF) 100 MCG/2ML IJ SOLN
INTRAMUSCULAR | Status: AC
  Administered 2020-08-12: 25 ug via INTRAVENOUS
  Filled 2020-08-12: qty 2

## 2020-08-12 MED ORDER — FENTANYL CITRATE (PF) 100 MCG/2ML IJ SOLN
INTRAMUSCULAR | Status: DC | PRN
  Administered 2020-08-12 (×2): 25 ug via INTRAVENOUS

## 2020-08-12 MED ORDER — LIDOCAINE HCL (PF) 1 % IJ SOLN
INTRAMUSCULAR | Status: AC
  Filled 2020-08-12: qty 30

## 2020-08-12 MED ORDER — OXYCODONE HCL 5 MG PO TABS: 5.0000 mg | ORAL_TABLET | Freq: Once | ORAL | Status: DC | PRN

## 2020-08-12 MED ORDER — PHENYLEPHRINE HCL (PRESSORS) 10 MG/ML IV SOLN
INTRAVENOUS | Status: DC | PRN
  Administered 2020-08-12 (×5): 50 ug via INTRAVENOUS

## 2020-08-12 MED ORDER — CHLORHEXIDINE GLUCONATE 4 % EX LIQD
60.0000 mL | Freq: Once | CUTANEOUS | Status: AC
  Administered 2020-08-12: 4 via TOPICAL

## 2020-08-12 MED ORDER — VANCOMYCIN HCL 1750 MG/350ML IV SOLN
1750.0000 mg | INTRAVENOUS | Status: DC
  Administered 2020-08-13 – 2020-08-14 (×2): 1750 mg via INTRAVENOUS
  Filled 2020-08-12 (×2): qty 350

## 2020-08-12 MED ORDER — POVIDONE-IODINE 10 % EX SWAB
2.0000 "application " | Freq: Once | CUTANEOUS | Status: AC
  Administered 2020-08-12: 2 via TOPICAL

## 2020-08-12 MED ORDER — PROPOFOL 500 MG/50ML IV EMUL
INTRAVENOUS | Status: AC
  Filled 2020-08-12: qty 50

## 2020-08-12 MED ORDER — PROPOFOL 10 MG/ML IV BOLUS
INTRAVENOUS | Status: DC | PRN
  Administered 2020-08-12 (×2): 30 mg via INTRAVENOUS
  Administered 2020-08-12: 40 mg via INTRAVENOUS

## 2020-08-12 MED ORDER — KETAMINE HCL 10 MG/ML IJ SOLN
INTRAMUSCULAR | Status: DC | PRN
  Administered 2020-08-12 (×2): 25 mg via INTRAVENOUS

## 2020-08-12 MED ORDER — KETAMINE HCL 50 MG/5ML IJ SOSY
PREFILLED_SYRINGE | INTRAMUSCULAR | Status: AC
  Filled 2020-08-12: qty 5

## 2020-08-12 MED ORDER — BUPIVACAINE HCL (PF) 0.5 % IJ SOLN
INTRAMUSCULAR | Status: AC
  Filled 2020-08-12: qty 30

## 2020-08-12 MED ORDER — OXYCODONE HCL 5 MG/5ML PO SOLN: 5.0000 mg | Freq: Once | ORAL | Status: DC | PRN

## 2020-08-12 MED ORDER — VANCOMYCIN HCL 1000 MG IV SOLR
INTRAVENOUS | Status: DC | PRN
  Administered 2020-08-12: 1000 mg via INTRAVENOUS

## 2020-08-12 MED ORDER — ONDANSETRON HCL 4 MG/2ML IJ SOLN: 4.0000 mg | Freq: Once | INTRAMUSCULAR | Status: DC | PRN

## 2020-08-12 MED ORDER — LACTATED RINGERS IV SOLN: INTRAVENOUS | Status: DC | PRN

## 2020-08-12 MED ORDER — VANCOMYCIN HCL IN DEXTROSE 1-5 GM/200ML-% IV SOLN
INTRAVENOUS | Status: AC
  Filled 2020-08-12: qty 200

## 2020-08-12 SURGICAL SUPPLY — 61 items
BLADE OSC/SAGITTAL MD 5.5X18 (BLADE) IMPLANT
BLADE OSCILLATING/SAGITTAL (BLADE)
BLADE SW THK.38XMED LNG THN (BLADE) IMPLANT
BNDG COHESIVE 4X5 TAN STRL (GAUZE/BANDAGES/DRESSINGS) ×3 IMPLANT
BNDG COHESIVE 6X5 TAN STRL LF (GAUZE/BANDAGES/DRESSINGS) ×3 IMPLANT
BNDG CONFORM 2 STRL LF (GAUZE/BANDAGES/DRESSINGS) ×3 IMPLANT
BNDG CONFORM 3 STRL LF (GAUZE/BANDAGES/DRESSINGS) ×3 IMPLANT
BNDG ELASTIC 4X5.8 VLCR STR LF (GAUZE/BANDAGES/DRESSINGS) ×3 IMPLANT
BNDG ESMARK 4X12 TAN STRL LF (GAUZE/BANDAGES/DRESSINGS) ×3 IMPLANT
BNDG GAUZE 4.5X4.1 6PLY STRL (MISCELLANEOUS) ×3 IMPLANT
CANISTER SUCT 1200ML W/VALVE (MISCELLANEOUS) ×3 IMPLANT
CANISTER SUCT 3000ML PPV (MISCELLANEOUS) ×3 IMPLANT
COVER WAND RF STERILE (DRAPES) ×3 IMPLANT
CUFF TOURN SGL QUICK 18X4 (TOURNIQUET CUFF) ×3 IMPLANT
DRAPE FLUOR MINI C-ARM 54X84 (DRAPES) IMPLANT
DRAPE XRAY CASSETTE 23X24 (DRAPES) IMPLANT
DRSG MEPILEX FLEX 3X3 (GAUZE/BANDAGES/DRESSINGS) IMPLANT
DURAPREP 26ML APPLICATOR (WOUND CARE) ×3 IMPLANT
ELECT REM PT RETURN 9FT ADLT (ELECTROSURGICAL) ×3
ELECTRODE REM PT RTRN 9FT ADLT (ELECTROSURGICAL) ×2 IMPLANT
GAUZE PACKING 1/4 X5 YD (GAUZE/BANDAGES/DRESSINGS) ×3 IMPLANT
GAUZE PACKING IODOFORM 1X5 (PACKING) ×3 IMPLANT
GAUZE SPONGE 4X4 12PLY STRL (GAUZE/BANDAGES/DRESSINGS) ×3 IMPLANT
GAUZE XEROFORM 1X8 LF (GAUZE/BANDAGES/DRESSINGS) ×3 IMPLANT
GLOVE INDICATOR 8.0 STRL GRN (GLOVE) ×6 IMPLANT
GLOVE SURG ENC MOIS LTX SZ7.5 (GLOVE) ×6 IMPLANT
GOWN STRL REUS W/ TWL XL LVL3 (GOWN DISPOSABLE) ×4 IMPLANT
GOWN STRL REUS W/TWL MED LVL3 (GOWN DISPOSABLE) ×6 IMPLANT
GOWN STRL REUS W/TWL XL LVL3 (GOWN DISPOSABLE) ×2
HANDPIECE VERSAJET DEBRIDEMENT (MISCELLANEOUS) ×3 IMPLANT
IV NS 1000ML (IV SOLUTION) ×1
IV NS 1000ML BAXH (IV SOLUTION) ×2 IMPLANT
KIT DRSG VAC SLVR GRANUFM (MISCELLANEOUS) ×7 IMPLANT
KIT TURNOVER KIT A (KITS) ×3 IMPLANT
LABEL OR SOLS (LABEL) ×3 IMPLANT
MANIFOLD NEPTUNE II (INSTRUMENTS) ×3 IMPLANT
NEEDLE FILTER BLUNT 18X 1/2SAF (NEEDLE) ×1
NEEDLE FILTER BLUNT 18X1 1/2 (NEEDLE) ×2 IMPLANT
NEEDLE HYPO 25X1 1.5 SAFETY (NEEDLE) ×3 IMPLANT
NS IRRIG 500ML POUR BTL (IV SOLUTION) ×3 IMPLANT
PACK EXTREMITY ARMC (MISCELLANEOUS) ×3 IMPLANT
PAD ABD DERMACEA PRESS 5X9 (GAUZE/BANDAGES/DRESSINGS) ×3 IMPLANT
PULSAVAC PLUS IRRIG FAN TIP (DISPOSABLE) ×3
RASP SM TEAR CROSS CUT (RASP) IMPLANT
SHIELD FULL FACE ANTIFOG 7M (MISCELLANEOUS) ×3 IMPLANT
SOL .9 NS 3000ML IRR  AL (IV SOLUTION)
SOL .9 NS 3000ML IRR UROMATIC (IV SOLUTION) IMPLANT
SOL PREP PVP 2OZ (MISCELLANEOUS) ×3
SOLUTION PREP PVP 2OZ (MISCELLANEOUS) ×2 IMPLANT
SPONGE LAP 18X18 RF (DISPOSABLE) ×3 IMPLANT
STOCKINETTE IMPERVIOUS 9X36 MD (GAUZE/BANDAGES/DRESSINGS) ×3 IMPLANT
SUT ETHILON 2 0 FS 18 (SUTURE) ×6 IMPLANT
SUT ETHILON 4-0 (SUTURE) ×1
SUT ETHILON 4-0 FS2 18XMFL BLK (SUTURE) ×2
SUT VIC AB 3-0 SH 27 (SUTURE) ×1
SUT VIC AB 3-0 SH 27X BRD (SUTURE) ×2 IMPLANT
SUT VIC AB 4-0 FS2 27 (SUTURE) ×3 IMPLANT
SUTURE ETHLN 4-0 FS2 18XMF BLK (SUTURE) ×2 IMPLANT
SWAB CULTURE AMIES ANAERIB BLU (MISCELLANEOUS) ×3 IMPLANT
SYR 10ML LL (SYRINGE) ×6 IMPLANT
TIP FAN IRRIG PULSAVAC PLUS (DISPOSABLE) ×2 IMPLANT

## 2020-08-12 NOTE — Anesthesia Preprocedure Evaluation (Addendum)
Anesthesia Evaluation  Patient identified by MRN, date of birth, ID band Patient awake    Reviewed: Allergy & Precautions, H&P , NPO status , Patient's Chart, lab work & pertinent test results  History of Anesthesia Complications Negative for: history of anesthetic complications  Airway Mallampati: II  TM Distance: >3 FB     Dental  (+) Chipped   Pulmonary neg pulmonary ROS, neg sleep apnea, neg COPD,    breath sounds clear to auscultation       Cardiovascular hypertension, (-) angina+ CAD, + Past MI and +CHF  (-) Cardiac Stents (-) dysrhythmias  Rhythm:regular Rate:Normal  Echo 04/24/20: 1. Left ventricular ejection fraction, by estimation, is 60 to 65%. The  left ventricle has normal function. The left ventricle has no regional  wall motion abnormalities. Left ventricular diastolic parameters were  normal.  2. Right ventricular systolic function is normal. The right ventricular  size is moderately enlarged.  3. The mitral valve is grossly normal. No evidence of mitral valve  regurgitation.  4. The aortic valve is grossly normal. Aortic valve regurgitation is not  visualized.    Neuro/Psych Peripheral neuropathy negative psych ROS   GI/Hepatic Neg liver ROS, GERD  ,  Endo/Other  negative endocrine ROS  Renal/GU      Musculoskeletal   Abdominal   Peds  Hematology  (+) Blood dyscrasia, anemia ,   Anesthesia Other Findings Past Medical History: No date: Arthritis No date: Benign essential tremor 2012: Fracture, femur ([place]) No date: GERD (gastroesophageal reflux disease) No date: H/O echocardiogram     Comment:  05/2013 No date: Hx of colonoscopy     Comment:  04/22/2013 No date: Hypercholesterolemia No date: Hypertension No date: Myocardial infarction [facility])     Comment:  1987 No date: Polyneuropathy 2004: Prostate cancer ([place]) No date: Superficial hematoma  Past Surgical History: No date: BASAL  CELL CARCINOMA EXCISION 05/01/2009: COLONOSCOPY     Comment:  Positive for colonic polyps 02/24/2016: COLONOSCOPY WITH PROPOFOL; N/A     Comment:  Procedure: COLONOSCOPY WITH PROPOFOL;  Surgeon:               Christene Lye, MD;  Location: [facility] ENDOSCOPY;                Service: Endoscopy;  Laterality: N/A; 1987: CORONARY ARTERY BYPASS GRAFT     Comment:  X2 1990: HERNIA REPAIR 2004: PROSTATE SURGERY  BMI    Body Mass Index: 28.85 kg/m      Reproductive/Obstetrics negative OB ROS                            Anesthesia Physical Anesthesia Plan  ASA: III  Anesthesia Plan: General LMA   Post-op Pain Management:    Induction:   PONV Risk Score and Plan: Treatment may vary due to age or medical condition and Propofol infusion  Airway Management Planned: Simple Face Mask  Additional Equipment:   Intra-op Plan:   Post-operative Plan:   Informed Consent: I have reviewed the patients History and Physical, chart, labs and discussed the procedure including the risks, benefits and alternatives for the proposed anesthesia with the patient or authorized representative who has indicated his/her understanding and acceptance.     Dental Advisory Given  Plan Discussed with: Anesthesiologist, CRNA and Surgeon  Anesthesia Plan Comments:        Anesthesia Quick Evaluation

## 2020-08-12 NOTE — Progress Notes (Signed)
PROGRESS NOTE    [name]   CWC:[id number]  DOB: 08/09/1935  PCP: [name], DO    DOA: 08/09/2020 LOS: 2   Brief Narrative   Summary of HPI on admission: 85 y.o. male with medical history significant for idiopathic peripheral neuropathy, hypertension, hyperlipidemia, GERD, presented to the ED on 08/09/2020 for chief concerns of worsening left leg pain since 2/24.  No preceding trauma.  No fevers or chills.  ED course: Temp 100.78F, tachycardic HR 106, respirations 23.  Labs notable for hyponatremia 125, hypokalemia 2.7, hypochloremia 86, hypomagnesemia 1.5, leukocytosis 14.6, mild anemia hemoglobin 12.7 macrocytic.  Negative COVID-19 and influenza's on PCR.  UA with moderate hemoglobin but negative for infection.  Blood cultures were drawn, returned positive for MRSA.  Hospital course to date: Admitted to hospitalist service for further evaluation and management of sepsis due to left foot cellulitis and multiple electrolyte derangements.  Left lower extremity venous US negative for DVT. 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 MRI left ankle negative for signs of abscess or osteomyelitis, but showed diffuse cellulitis of the ankle and hindfoot, sprain and partial tear of the ATFL and small joint effusion.  On broad-spectrum antibiotic coverage with vancomycin and cefepime.  Infectious disease consulted.   Assessment & Plan   Active Problems:   Sepsis ([place])   MRSA bacteremia    Sepsis secondary to MRSA bacteremia and Left Foot Cellulitis with abscess Sepsis POA as evidenced by leukocytosis, tachycardia, tachypnea.  Treated per protocol in the ED.  Continued on broad spectrum antibiotics.   --ID consulted --Continue Vanc and Cefepime --Podiatry consulted for debridement of left heel thick callus with drainage --Podiatry taking to OR today for I&D of abscess on lateral  aspect of the heel --TTE performed 2/28, read pending. Have messaged pt's cardiologist, Nehemiah Massed. If neg will need TEE --sensitivities pending - pt/ot consulted  Hypokalemia - suspect secondary to diuretics Replaced. k wnl today Hold hydrochlorothiazide and Lasix, resume once K within normal and maintain adequate replacement  Hyponatremia - POA, chronic. May be 2/2 thiazide - hold hctz, monitor  Chronic systolic CHF / CAD / MR / TR - all appear stable.  Monitor volume status, renal function, electrolytes --Continue ASA --cont home lasix  Hypotension POA / Hx of Hypertension -resumed amlodipine 2.5 mg, lasix -Held: home HCTZ -Hydralazine PRN  Hyperlipidemia-pravastatin 80 mg daily  Idiopathic peripheral neuropathy-resumed gabapentin 1200 mg twice daily p.o.  Insomnia-melatonin 5 mg daily at bedtime  GERD-PPI   DVT prophylaxis: enoxaparin (LOVENOX) injection 40 mg Start: 08/09/20 2000 Place TED hose Start: 08/09/20 1758   Diet:  Diet Orders (From admission, onward)    Start     Ordered   08/12/20 0430  Diet NPO time specified  Diet effective ____        08/11/20 1239            Code Status: Full Code    Subjective 08/12/20    Feeling well, no pain, has appetite, no n/v/d.   Disposition Plan & Communication   Status is: Inpatient  Inpatient status appropriate due to severity of illness on IV antibiotics for MRSA bacteremia.  Dispo: The patient is from: Home              Anticipated d/c is to: likely home              Patient currently is NOT medically stable for d/c.  Difficult to place patient No   Family Communication: wife at bedside on rounds   Consults, Procedures, Significant Events   Consultants:   Infectious Disease  Podiatry  Procedures:   None  Antimicrobials:  Anti-infectives (From admission, onward)   Start     Dose/Rate Route Frequency Ordered Stop   08/10/20 1100  vancomycin (VANCOREADY) IVPB 1000 mg/200 mL        1,000  mg 200 mL/hr over 60 Minutes Intravenous Every 12 hours 08/09/20 1905     08/09/20 2345  ceFEPIme (MAXIPIME) 2 g in sodium chloride 0.9 % 100 mL IVPB  Status:  Discontinued        2 g 200 mL/hr over 30 Minutes Intravenous Every 8 hours 08/09/20 1852 08/09/20 1854   08/09/20 2345  ceFEPIme (MAXIPIME) 2 g in sodium chloride 0.9 % 100 mL IVPB        2 g 200 mL/hr over 30 Minutes Intravenous Every 8 hours 08/09/20 1854     08/09/20 1930  vancomycin (VANCOCIN) IVPB 1000 mg/200 mL premix  Status:  Discontinued        1,000 mg 200 mL/hr over 60 Minutes Intravenous  Once 08/09/20 1852 08/09/20 1854   08/09/20 1930  vancomycin (VANCOCIN) IVPB 1000 mg/200 mL premix        1,000 mg 200 mL/hr over 60 Minutes Intravenous  Once 08/09/20 1854 08/10/20 0020   08/09/20 1600  ceFEPIme (MAXIPIME) 2 g in sodium chloride 0.9 % 100 mL IVPB        2 g 200 mL/hr over 30 Minutes Intravenous  Once 08/09/20 1555 08/09/20 1636   08/09/20 1600  metroNIDAZOLE (FLAGYL) IVPB 500 mg        500 mg 100 mL/hr over 60 Minutes Intravenous  Once 08/09/20 1555 08/09/20 1750   08/09/20 1600  vancomycin (VANCOCIN) IVPB 1000 mg/200 mL premix        1,000 mg 200 mL/hr over 60 Minutes Intravenous  Once 08/09/20 1555 08/09/20 1926        Micro    Objective   Vitals:   08/11/20 1645 08/11/20 2042 08/12/20 0434 08/12/20 0907  BP: 125/67 132/75 129/67 131/74  Pulse: 73 81 81 76  Resp: 17 18 18 17   Temp: 99.4 F (37.4 C) 98.6 F (37 C) 98.3 F (36.8 C) 97.8 F (36.6 C)  TempSrc: Oral Oral  Oral  SpO2: 92% 97% 100% 98%  Weight:   96.5 kg   Height:        Intake/Output Summary (Last 24 hours) at 08/12/2020 1035 Last data filed at 08/12/2020 0500 Gross per 24 hour  Intake --  Output 1100 ml  Net -1100 ml   Filed Weights   08/10/20 0336 08/11/20 0443 08/12/20 0434  Weight: 95.6 kg 96.2 kg 96.5 kg    Physical Exam:  General exam: awake, alert, no acute distress Respiratory system: symmetric chest rise, normal  respiratory effort, on room air. Cardiovascular system: RRR, no RLE edema, LLE edema due to cellulitis.   Central nervous system: A&O x3. no gross focal neurologic deficits, normal speech Extremities: left foot dressed, erythematous and edematous to mid calf Psychiatry: normal mood, congruent affect, judgement and insight appear normal  Labs   Data Reviewed: I have personally reviewed following labs and imaging studies  CBC: Recent Labs  Lab 08/09/20 1551 08/10/20 0503 08/11/20 0709 08/12/20 0525  WBC 14.6* 11.3* 8.9 8.6  NEUTROABS 13.3*  --   --   --   HGB 12.7* 9.7*  10.4* 10.0*  HCT 35.2* 27.4* 29.0* 28.5*  MCV 101.7* 102.2* 104.7* 104.0*  PLT 263 207 204 858   Basic Metabolic Panel: Recent Labs  Lab 08/09/20 1551 08/09/20 2224 08/10/20 0503 08/11/20 0709 08/12/20 0525  NA 125* 126* 127* 130* 129*  K 2.7* 3.3* 2.9* 3.6 3.5  CL 86* 91* 92* 98 97*  CO2 26 25 24 22 24   GLUCOSE 850* 277* 107* 87 85  BUN 18 18 19 23 18   CREATININE 0.83 1.02 0.88 1.03 0.89  CALCIUM 9.7 9.2 8.8* 9.1 9.2  MG 1.5*  --   --  1.8  --    GFR: Estimated Creatinine Clearance: 77.1 mL/min (by C-G formula based on SCr of 0.89 mg/dL). Liver Function Tests: Recent Labs  Lab 08/09/20 1551  AST 17  ALT 13  ALKPHOS 61  BILITOT 1.1  PROT 8.4*  ALBUMIN 4.2   No results for input(s): LIPASE, AMYLASE in the last 168 hours. No results for input(s): AMMONIA in the last 168 hours. Coagulation Profile: No results for input(s): INR, PROTIME in the last 168 hours. Cardiac Enzymes: No results for input(s): CKTOTAL, CKMB, CKMBINDEX, TROPONINI in the last 168 hours. BNP (last 3 results) No results for input(s): PROBNP in the last 8760 hours. HbA1C: No results for input(s): HGBA1C in the last 72 hours. CBG: Recent Labs  Lab 08/12/20 0928  GLUCAP 121*   Lipid Profile: No results for input(s): CHOL, HDL, LDLCALC, TRIG, CHOLHDL, LDLDIRECT in the last 72 hours. Thyroid Function Tests: Recent Labs     08/09/20 1804  TSH 0.270*   Anemia Panel: No results for input(s): VITAMINB12, FOLATE, FERRITIN, TIBC, IRON, RETICCTPCT in the last 72 hours. Sepsis Labs: Recent Labs  Lab 08/09/20 1549 08/09/20 1804  LATICACIDVEN 1.1 1.0    Recent Results (from the past 240 hour(s))  Blood culture (routine x 2)     Status: Abnormal   Collection Time: 08/09/20  3:49 PM   Specimen: BLOOD  Result Value Ref Range Status   Specimen Description   Final    BLOOD BLOOD LEFT FOREARM Performed at [facility], [address], [place], [place] [address]    Special Requests   Final    BOTTLES DRAWN AEROBIC AND ANAEROBIC Blood Culture adequate volume Performed at [facility], [address] [place], [place] [address]    Culture  Setup Time   Final    GRAM POSITIVE COCCI AEROBIC BOTTLE ONLY CRITICAL RESULT CALLED TO, READ BACK BY AND VERIFIED WITH: NATHAN BELUE ON 08/10/20 AT 2094 QSD Performed at [facility], [place] [address] [place], [place] [address]    Culture METHICILLIN RESISTANT STAPHYLOCOCCUS AUREUS (A)  Final   Report Status 08/12/2020 FINAL  Final   Organism ID, Bacteria METHICILLIN RESISTANT STAPHYLOCOCCUS AUREUS  Final      Susceptibility   Methicillin resistant staphylococcus aureus - MIC*    CIPROFLOXACIN >=8 RESISTANT Resistant     ERYTHROMYCIN <=0.25 SENSITIVE Sensitive     GENTAMICIN <=0.5 SENSITIVE Sensitive     OXACILLIN >=4 RESISTANT Resistant     TETRACYCLINE >=16 RESISTANT Resistant     VANCOMYCIN 1 SENSITIVE Sensitive     TRIMETH/SULFA <=10 SENSITIVE Sensitive     CLINDAMYCIN <=0.25 SENSITIVE Sensitive     RIFAMPIN <=0.5 SENSITIVE Sensitive     Inducible Clindamycin NEGATIVE Sensitive     * METHICILLIN RESISTANT STAPHYLOCOCCUS AUREUS  Blood Culture ID Panel (Reflexed)     Status: Abnormal   Collection Time: 08/09/20  3:49 PM  Result Value Ref Range Status   Enterococcus faecalis NOT DETECTED NOT DETECTED Final   Enterococcus Faecium NOT  DETECTED NOT DETECTED Final   Listeria monocytogenes NOT DETECTED NOT DETECTED Final   Staphylococcus species DETECTED (A) NOT DETECTED Final    Comment: CRITICAL RESULT CALLED TO, READ BACK BY AND VERIFIED WITH: NATHAN BELUE ON 08/10/20 AT 1497 QSD    Staphylococcus aureus (BCID) DETECTED (A) NOT DETECTED Final    Comment: Methicillin (oxacillin)-resistant Staphylococcus aureus (MRSA). MRSA is predictably resistant to beta-lactam antibiotics (except ceftaroline). Preferred therapy is vancomycin unless clinically contraindicated. Patient requires contact precautions if  hospitalized. CRITICAL RESULT CALLED TO, READ BACK BY AND VERIFIED WITH: NATHAN BELUE ON 08/10/20 AT 0616 QSD    Staphylococcus epidermidis NOT DETECTED NOT DETECTED Final   Staphylococcus lugdunensis NOT DETECTED NOT DETECTED Final   Streptococcus species NOT DETECTED NOT DETECTED Final   Streptococcus agalactiae NOT DETECTED NOT DETECTED Final   Streptococcus pneumoniae NOT DETECTED NOT DETECTED Final   Streptococcus pyogenes NOT DETECTED NOT DETECTED Final   A.calcoaceticus-baumannii NOT DETECTED NOT DETECTED Final   Bacteroides fragilis NOT DETECTED NOT DETECTED Final   Enterobacterales NOT DETECTED NOT DETECTED Final   Enterobacter cloacae complex NOT DETECTED NOT DETECTED Final   Escherichia coli NOT DETECTED NOT DETECTED Final   Klebsiella aerogenes NOT DETECTED NOT DETECTED Final   Klebsiella oxytoca NOT DETECTED NOT DETECTED Final   Klebsiella pneumoniae NOT DETECTED NOT DETECTED Final   Proteus species NOT DETECTED NOT DETECTED Final   Salmonella species NOT DETECTED NOT DETECTED Final   Serratia marcescens NOT DETECTED NOT DETECTED Final   Haemophilus influenzae NOT DETECTED NOT DETECTED Final   Neisseria meningitidis NOT DETECTED NOT DETECTED Final   Pseudomonas aeruginosa NOT DETECTED NOT DETECTED Final   Stenotrophomonas maltophilia NOT DETECTED NOT DETECTED Final   Candida albicans NOT DETECTED NOT  DETECTED Final   Candida auris NOT DETECTED NOT DETECTED Final   Candida glabrata NOT DETECTED NOT DETECTED Final   Candida krusei NOT DETECTED NOT DETECTED Final   Candida parapsilosis NOT DETECTED NOT DETECTED Final   Candida tropicalis NOT DETECTED NOT DETECTED Final   Cryptococcus neoformans/gattii NOT DETECTED NOT DETECTED Final   Meth resistant mecA/C and MREJ DETECTED (A) NOT DETECTED Final    Comment: CRITICAL RESULT CALLED TO, READ BACK BY AND VERIFIED WITH: NATHAN BELUE ON 08/10/20 AT 0263 QSD Performed at [facility], [address] [place], [place] [address]   Blood culture (routine x 2)     Status: Abnormal   Collection Time: 08/09/20  3:50 PM   Specimen: BLOOD  Result Value Ref Range Status   Specimen Description   Final    BLOOD RIGHT ANTECUBITAL Performed at [facility], [address] [place], [place] [address]    Special Requests   Final    BOTTLES DRAWN AEROBIC AND ANAEROBIC Blood Culture adequate volume Performed at [facility], [address] [place], [place] [address]    Culture  Setup Time   Final    GRAM POSITIVE COCCI IN BOTH AEROBIC AND ANAEROBIC BOTTLES CRITICAL VALUE NOTED.  VALUE IS CONSISTENT WITH PREVIOUSLY REPORTED AND CALLED VALUE. Performed at [facility], [address] [place], [place] [address]    Culture (A)  Final    STAPHYLOCOCCUS AUREUS SUSCEPTIBILITIES PERFORMED ON PREVIOUS CULTURE WITHIN THE LAST 5 DAYS. Performed at [facility], [place] [address], [place], [place] [address]    Report Status 08/12/2020  FINAL  Final  Resp Panel by RT-PCR (Flu A&B, Covid) Nasopharyngeal Swab     Status: None   Collection Time: 08/09/20  4:10 PM   Specimen: Nasopharyngeal Swab; Nasopharyngeal(NP) swabs in vial transport medium  Result Value Ref Range Status   SARS Coronavirus 2 by RT PCR NEGATIVE NEGATIVE Final    Comment: (NOTE) SARS-CoV-2 target nucleic acids are NOT DETECTED.  The SARS-CoV-2 RNA  is generally detectable in upper respiratory specimens during the acute phase of infection. The lowest concentration of SARS-CoV-2 viral copies this assay can detect is 138 copies/mL. A negative result does not preclude SARS-Cov-2 infection and should not be used as the sole basis for treatment or other patient management decisions. A negative result may occur with  improper specimen collection/handling, submission of specimen other than nasopharyngeal swab, presence of viral mutation(s) within the areas targeted by this assay, and inadequate number of viral copies(<138 copies/mL). A negative result must be combined with clinical observations, patient history, and epidemiological information. The expected result is Negative.  Fact Sheet for Patients:  EntrepreneurPulse.com.au  Fact Sheet for Healthcare Providers:  IncredibleEmployment.be  This test is no t yet approved or cleared by the [place] FDA and  has been authorized for detection and/or diagnosis of SARS-CoV-2 by FDA under an Emergency Use Authorization (EUA). This EUA will remain  in effect (meaning this test can be used) for the duration of the COVID-19 declaration under Section 564(b)(1) of the Act, 21 U.S.C.section 360bbb-3(b)(1), unless the authorization is terminated  or revoked sooner.       Influenza A by PCR NEGATIVE NEGATIVE Final   Influenza B by PCR NEGATIVE NEGATIVE Final    Comment: (NOTE) The Xpert Xpress SARS-CoV-2/FLU/RSV plus assay is intended as an aid in the diagnosis of influenza from Nasopharyngeal swab specimens and should not be used as a sole basis for treatment. Nasal washings and aspirates are unacceptable for Xpert Xpress SARS-CoV-2/FLU/RSV testing.  Fact Sheet for Patients: EntrepreneurPulse.com.au  Fact Sheet for Healthcare Providers: IncredibleEmployment.be  This test is not yet approved or cleared by the  [place] FDA and has been authorized for detection and/or diagnosis of SARS-CoV-2 by FDA under an Emergency Use Authorization (EUA). This EUA will remain in effect (meaning this test can be used) for the duration of the COVID-19 declaration under Section 564(b)(1) of the Act, 21 U.S.C. section 360bbb-3(b)(1), unless the authorization is terminated or revoked.  Performed at [facility], [address] [place], [place] [address]   MRSA PCR Screening     Status: Abnormal   Collection Time: 08/10/20 10:50 AM   Specimen: Nasal Mucosa; Nasopharyngeal  Result Value Ref Range Status   MRSA by PCR POSITIVE (A) NEGATIVE Final    Comment:        The GeneXpert MRSA Assay (FDA approved for NASAL specimens only), is one component of a comprehensive MRSA colonization surveillance program. It is not intended to diagnose MRSA infection nor to guide or monitor treatment for MRSA infections. RESULT CALLED TO, READ BACK BY AND VERIFIED WITH: Jonnie Kind 08/10/20 AT 1257 BY ACR Performed at [facility], [address] [place], [place] [address]   CULTURE, BLOOD (ROUTINE X 2) w Reflex to ID Panel     Status: None (Preliminary result)   Collection Time: 08/10/20 11:55 PM   Specimen: BLOOD  Result Value Ref Range Status   Specimen Description BLOOD BLOOD LEFT HAND  Final   Special Requests   Final  BOTTLES DRAWN AEROBIC AND ANAEROBIC Blood Culture adequate volume   Culture   Final    NO GROWTH 1 DAY Performed at [facility], [address] [place], [place] [address]    Report Status PENDING  Incomplete  CULTURE, BLOOD (ROUTINE X 2) w Reflex to ID Panel     Status: None (Preliminary result)   Collection Time: 08/10/20 11:55 PM   Specimen: BLOOD  Result Value Ref Range Status   Specimen Description BLOOD BLOOD RIGHT HAND  Final   Special Requests   Final    BOTTLES DRAWN AEROBIC AND ANAEROBIC Blood Culture adequate volume   Culture   Final    NO  GROWTH 1 DAY Performed at [facility], [address], [place], [place] [address]    Report Status PENDING  Incomplete  Aerobic/Anaerobic Culture w Gram Stain (surgical/deep wound)     Status: None (Preliminary result)   Collection Time: 08/11/20  4:40 PM   Specimen: Wound; Tissue  Result Value Ref Range Status   Specimen Description   Final    WOUND Performed at [facility], [address] [place], [place] [address]    Special Requests   Final    LEFT FOOT Performed at [facility], [address] [place], [place] [address]    Gram Stain   Final    FEW WBC PRESENT,BOTH PMN AND MONONUCLEAR GRAM POSITIVE COCCI Performed at [facility], [place] [address], [place], [place] [address]    Culture PENDING  Incomplete   Report Status PENDING  Incomplete      Imaging Studies   No results found.   Medications   Scheduled Meds: . amLODipine  2.5 mg Oral Daily  . aspirin EC  81 mg Oral Daily  . azelastine  1 spray Each Nare BID  . B-complex with vitamin C  1 tablet Oral Daily  . calcium citrate-vitamin D  1 tablet Oral Daily  . Chlorhexidine Gluconate Cloth  6 each Topical Q0600  . enoxaparin (LOVENOX) injection  40 mg Subcutaneous Q24H  . folic acid  1 mg Oral Daily  . furosemide  40 mg Oral Daily  . gabapentin  1,200 mg Oral BID  . melatonin  5 mg Oral QHS  . pantoprazole  40 mg Oral Daily  . pravastatin  80 mg Oral Daily  . primidone  50 mg Oral TID  . tretinoin  1 application Topical QHS  . triamcinolone  1 application Topical BID  . cyanocobalamin  1,000 mcg Oral Daily   Continuous Infusions: . sodium chloride Stopped (08/10/20 1753)  . ceFEPime (MAXIPIME) IV 2 g (08/12/20 0946)  . vancomycin 1,000 mg (08/12/20 0118)       LOS: 2 days    Time spent: [place], MD [facility]  08/12/2020, 10:35 AM      If 7PM-7AM, please contact night-coverage. How to contact the [facility] Attending or Consulting provider  [place] or covering provider during after hours [place], for this patient?    1. Check the care team in [facility] and look for a) attending/consulting [facility] provider listed and b) the [facility] team listed 2. Log into [email or web address] and use [place]'s universal password to access. If you do not have the password, please contact the hospital operator. 3. Locate the [facility] provider you are looking for under [facility] and page to a number that you can be directly reached. 4. If you still  have difficulty reaching the provider, please page the [facility] (Director on Call) for the Hospitalists listed on amion for assistance.

## 2020-08-12 NOTE — Consult Note (Signed)
[facility] Consultation Note  Patient ID: [name], MRN: [id number], DOB/AGE: 83-Jan-1937 83 y.o. Admit date: 08/09/2020   Date of Consult: 08/12/2020 Primary Physician: [name], DO Primary Cardiologist: Nehemiah Massed  Chief Complaint:  Chief Complaint  Patient presents with  . Leg Swelling   Reason for Consult: Endocarditis  HPI: 85 y.o. male with known coronary disease status post coronary bypass graft hypertension hyperlipidemia peripheral vascular disease having significant left lower leg cellulitis and edema.  There is an ulcer on the left heel for which is not healing very well and has caused MRSA bacteremia.  With this bacteremia the patient has had appropriate medication management and recently has had a placement of a wound VAC.  He has not had any significant anginal or congestive heart failure at this time but has had an EKG showing sinus tachycardia.  The patient has remained on appropriate medication management for further risk reduction of coronary artery disease status post coronary bypass graft and has had no side effects at this time.  Echocardiogram with this bacteremia shows normal LV systolic function with ejection fraction 55% and mild valvular heart disease but no apparent evidence of endocarditis.  We have discussed with the patient that further evaluation and treatment options were appropriate for the possibility of transesophageal echocardiogram for further evaluation.  Patient understands risk and benefits of transesophageal echocardiogram including death stroke heart attack in esophageal perforation sore throat and side effects of medication.  He is at low risk for moderate sedation  Past Medical History:  Diagnosis Date  . Arthritis   . Benign essential tremor   . Fracture, femur ([place]) 2012  . GERD (gastroesophageal reflux disease)   . H/O echocardiogram    05/2013  . Hx of colonoscopy    04/22/2013  . Hypercholesterolemia   .  Hypertension   . Myocardial infarction ([place])    1987  . Polyneuropathy   . Prostate cancer ([place]) 2004  . Superficial hematoma       Surgical History:  Past Surgical History:  Procedure Laterality Date  . BASAL CELL CARCINOMA EXCISION    . COLONOSCOPY  05/01/2009   Positive for colonic polyps  . COLONOSCOPY WITH PROPOFOL N/A 02/24/2016   Procedure: COLONOSCOPY WITH PROPOFOL;  Surgeon: Christene Lye, MD;  Location: [facility] ENDOSCOPY;  Service: Endoscopy;  Laterality: N/A;  . CORONARY ARTERY BYPASS GRAFT  1987   X2  . HERNIA REPAIR  1990  . PROSTATE SURGERY  2004     Home Meds: Prior to Admission medications   Medication Sig Start Date End Date Taking? Authorizing Provider  Alpha Lipoic Acid 200 MG CAPS Take 2 capsules by mouth daily.   Yes [provider]  amLODipine (NORVASC) 2.5 MG tablet TAKE 1 TABLET BY MOUTH  DAILY Patient taking differently: Take 2.5 mg by mouth daily. 02/23/20  Yes Karamalegos, Alexander J, DO  Apoaequorin 10 MG CAPS Take 1 capsule by mouth as directed.   Yes [provider]  aspirin 81 MG tablet Take 162 mg by mouth at bedtime.   Yes [provider]  azelastine (ASTELIN) 0.1 % nasal spray Place 1 spray into both nostrils 2 (two) times daily. Use in each nostril as directed 12/23/19  Yes Karamalegos, Devonne Doughty, DO  B Complex Vitamins (VITAMIN-B COMPLEX PO) Take 1 tablet by mouth daily.   Yes [provider]  Biotin 1000 MCG tablet Take 1,000 mcg by mouth daily.   Yes [provider]  Calcium Carb-Cholecalciferol  500-100 MG-UNIT CHEW Chew 500 mg by mouth daily.   Yes [provider]  cyanocobalamin 1000 MCG tablet Take 1,000 mcg by mouth daily.   Yes [provider]  diphenhydrAMINE (BENADRYL) 25 MG tablet Take 25 mg by mouth at bedtime as needed for allergies.   Yes [provider]  folic acid (FOLVITE) 1 MG tablet Take 1 mg by mouth daily.   Yes [provider]  furosemide  (LASIX) 40 MG tablet Take 1 tablet (40 mg total) by mouth daily. 06/24/20  Yes Karamalegos, Devonne Doughty, DO  gabapentin (NEURONTIN) 600 MG tablet TAKE 2 TABLETS BY MOUTH  TWICE DAILY Patient taking differently: Take 1,200 mg by mouth 2 (two) times daily. 02/23/20  Yes Karamalegos, Devonne Doughty, DO  Glucosamine Sulfate 1000 MG CAPS Take 1 capsule by mouth daily.   Yes [provider]  hydrochlorothiazide (HYDRODIURIL) 25 MG tablet TAKE 1 TABLET BY MOUTH  TWICE DAILY Patient taking differently: Take 25 mg by mouth 2 (two) times daily. (in the morning and about midday) 07/05/20  Yes Karamalegos, Devonne Doughty, DO  Lecithin 400 MG CAPS Take 400 mg by mouth daily.   Yes [provider]  losartan (COZAAR) 50 MG tablet TAKE 1 TABLET BY MOUTH  DAILY Patient taking differently: Take 50 mg by mouth at bedtime. 02/24/20  Yes Karamalegos, Alexander J, DO  melatonin 5 MG TABS Take 5 mg by mouth at bedtime.   Yes [provider]  Omega 3 1000 MG CAPS Take 1,000 mg by mouth daily.   Yes [provider]  omeprazole (PRILOSEC) 20 MG capsule TAKE 1 CAPSULE BY MOUTH  TWICE DAILY BEFORE MEALS Patient taking differently: Take 20 mg by mouth 2 (two) times daily before a meal. 02/23/20  Yes Karamalegos, Devonne Doughty, DO  potassium chloride SA (KLOR-CON) 20 MEQ tablet Take 1 tablet (20 mEq total) by mouth 2 (two) times daily. 12/19/19  Yes Karamalegos, Devonne Doughty, DO  pravastatin (PRAVACHOL) 80 MG tablet TAKE 1 TABLET BY MOUTH  DAILY Patient taking differently: Take 80 mg by mouth at bedtime. 02/23/20  Yes Karamalegos, Devonne Doughty, DO  primidone (MYSOLINE) 50 MG tablet TAKE 1 TABLET BY MOUTH 3  TIMES DAILY Patient taking differently: Take 50-100 mg by mouth See admin instructions. Take 2 tablets (100mg ) by mouth every morning and take 1 tablet (50mg ) by mouth every night 02/24/20  Yes Karamalegos, Devonne Doughty, DO  Selenium 200 MCG CAPS Take 200 mcg by mouth daily.   Yes [provider]   tretinoin (RETIN-A) 0.05 % cream Apply 1 application topically at bedtime.   Yes [provider]  triamcinolone (KENALOG) 0.1 % Apply 1 application topically 2 (two) times daily. (apply to hands, legs and feet) 07/27/20  Yes [provider]    Inpatient Medications:  . amLODipine  2.5 mg Oral Daily  . aspirin EC  81 mg Oral Daily  . azelastine  1 spray Each Nare BID  . B-complex with vitamin C  1 tablet Oral Daily  . calcium citrate-vitamin D  1 tablet Oral Daily  . Chlorhexidine Gluconate Cloth  6 each Topical Q0600  . enoxaparin (LOVENOX) injection  40 mg Subcutaneous Q24H  . folic acid  1 mg Oral Daily  . furosemide  40 mg Oral Daily  . gabapentin  1,200 mg Oral BID  . melatonin  5 mg Oral QHS  . pantoprazole  40 mg Oral Daily  . pravastatin  80 mg Oral Daily  . primidone  50 mg Oral TID  . tretinoin  1 application Topical QHS  . triamcinolone  1 application Topical BID  . cyanocobalamin  1,000 mcg Oral Daily   . sodium chloride Stopped (08/10/20 1753)  . ceFEPime (MAXIPIME) IV 2 g (08/12/20 0946)  . vancomycin    . [START ON 08/13/2020] vancomycin      Allergies:  Allergies  Allergen Reactions  . Codeine Nausea And Vomiting  . Indocin [Indomethacin] Other (See Comments)  . Latex Other (See Comments)    Blisters  . Mupirocin     Blisters  . Plavix [Clopidogrel Bisulfate]     GI intolerance, n/v  . Polysporin [Bacitracin-Polymyxin B]     Blisters  . Shellfish Allergy   . Tape   . Neosporin [Neomycin-Bacitracin Zn-Polymyx] Rash  . Other Nausea And Vomiting    mussels    Social History   Socioeconomic History  . Marital status: Married    Spouse name: Not on file  . Number of children: Not on file  . Years of education: Not on file  . Highest education level: Not on file  Occupational History  . Not on file  Tobacco Use  . Smoking status: Never Smoker  . Smokeless tobacco: Never Used  Vaping Use  . Vaping Use: Never used  Substance and  Sexual Activity  . Alcohol use: Yes    Alcohol/week: 7.0 standard drinks    Types: 7 Glasses of wine per week    Comment: wine in the evening with dinner  . Drug use: No  . Sexual activity: Not on file  Other Topics Concern  . Not on file  Social History Narrative  . Not on file   Social Determinants of Health   Financial Resource Strain: Not on file  Food Insecurity: Not on file  Transportation Needs: Not on file  Physical Activity: Not on file  Stress: Not on file  Social Connections: Not on file  Intimate Partner Violence: Not on file     Family History  Problem Relation Age of Onset  . Heart disease Mother   . Congestive Heart Failure Father      Review of Systems Positive for foot pain Negative for: General:  chills, fever, night sweats or weight changes.  Cardiovascular: PND orthopnea syncope dizziness  Dermatological skin lesions rashes Respiratory: Cough congestion Urologic: Frequent urination urination at night and hematuria Abdominal: negative for nausea, vomiting, diarrhea, bright red blood per rectum, melena, or hematemesis Neurologic: negative for visual changes, and/or hearing changes  All other systems reviewed and are otherwise negative except as noted above.  Labs: No results for input(s): CKTOTAL, CKMB, TROPONINI in the last 72 hours. Lab Results  Component Value Date   WBC 8.6 08/12/2020   HGB 10.0 (L) 08/12/2020   HCT 28.5 (L) 08/12/2020   MCV 104.0 (H) 08/12/2020   PLT 226 08/12/2020    Recent Labs  Lab 08/09/20 1551 08/09/20 2224 08/12/20 0525  NA 125*   < > 129*  K 2.7*   < > 3.5  CL 86*   < > 97*  CO2 26   < > 24  BUN 18   < > 18  CREATININE 0.83   < > 0.89  CALCIUM 9.7   < > 9.2  PROT 8.4*  --   --   BILITOT 1.1  --   --   ALKPHOS 61  --   --   ALT 13  --   --   AST  17  --   --   GLUCOSE 122*   < > 85   < > = values in this interval not displayed.   Lab Results  Component Value Date   CHOL 157 12/12/2019   HDL 94  12/12/2019   LDLCALC 46 12/12/2019   TRIG 89 12/12/2019   No results found for: DDIMER  Radiology/Studies:  MR ANKLE LEFT W WO CONTRAST  Result Date: 08/09/2020 CLINICAL DATA:  Left leg swelling and redness, question of osteomyelitis EXAM: MRI OF THE LEFT ANKLE WITHOUT AND WITH CONTRAST TECHNIQUE: Multiplanar, multisequence MR imaging of the ankle was performed before and after the administration of intravenous contrast. CONTRAST:  40mL GADAVIST GADOBUTROL 1 MMOL/ML IV SOLN COMPARISON:  Radiograph same day FINDINGS: Bones/Joint/Cartilage Osteoarthritis is seen at the first medial cuneiform metatarsal joint with joint space loss and subchondral cystic changes. Minimally increased signal seen within the lateral malleolus. No areas of cortical destruction or periosteal reaction. No areas of abnormal enhancement are seen throughout the osseous structures. Tibiotalar joint osteoarthritis is seen with joint space loss and diffuse chondral loss with subchondral marrow signal changes seen anteriorly. A small ankle joint effusion is seen. Ligaments Attenuation with increased signal seen within the anterior talofibular ligament. There is also slight thickening of the spring ligament, however it is intact. The remainder of the ligaments appear to be intact. Muscles and Tendons Mildly increased signal seen within the multiple on the plantar surface. No focal atrophy or tear however is noted. A small amount of fluid is seen surrounding the posterior tibialis and peroneal tendons, however they are intact. There is minimally increased signal seen at the insertion site of the Achilles tendon with mild adjacent reactive marrow. Soft tissue There is diffuse subcutaneous edema and skin thickening seen surrounding the ankle and dorsum of the hindfoot. No loculated fluid collections are noted. No areas of abnormal enhancement are seen. IMPRESSION: IMPRESSION 1. Findings suggestive of diffuse cellulitis surrounding the ankle and  hindfoot. No evidence of soft tissue abscess or osteomyelitis 2. Intrasubstance sprain and partial tear of the anterior talofibular ligament, however there are intact fibers throughout 3. Small ankle joint effusion 4. 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 Mild insertional Achilles tendinosis Electronically Signed   By: Prudencio Pair M.D.   On: 08/09/2020 22:03   US Venous Img Lower Unilateral Left  Result Date: 08/09/2020 CLINICAL DATA:  Left lower extremity edema and pain. EXAM: LEFT LOWER EXTREMITY VENOUS DOPPLER ULTRASOUND TECHNIQUE: Gray-scale sonography with compression, as well as color and duplex ultrasound, were performed to evaluate the deep venous system(s) from the level of the common femoral vein through the popliteal and proximal calf veins. COMPARISON:  None. FINDINGS: VENOUS Normal compressibility of the common femoral, superficial femoral, and popliteal veins, as well as the visualized calf veins. Visualized portions of profunda femoral vein and great saphenous vein unremarkable. No filling defects to suggest DVT on grayscale or color Doppler imaging. Doppler waveforms show normal direction of venous flow, normal respiratory plasticity and response to augmentation. Limited views of the contralateral common femoral vein are unremarkable. OTHER None. Limitations: none IMPRESSION: Negative. Electronically Signed   By: Fidela Salisbury M.D.   On: 08/09/2020 16:47   DG Foot 2 Views Left  Result Date: 08/09/2020 CLINICAL DATA:  left leg redness EXAM: LEFT FOOT - 2 VIEW COMPARISON:  MRI left foot 02/17/2016 FINDINGS: First and second digit distal phalangeal amputation. No cortical erosion or destruction. There is no evidence of fracture or  dislocation. There is no evidence of severe arthropathy or other focal bone abnormality. Subcutaneus soft tissue edema. Vascular calcifications. IMPRESSION: 1. No radiographic findings of osteomyelitis in a patient status post first  and second digits distal phalangeal amputation. 2. No acute displaced fracture or dislocation of the bones of the left foot. Electronically Signed   By: Iven Finn M.D.   On: 08/09/2020 18:30   ECHOCARDIOGRAM COMPLETE  Result Date: 08/12/2020    ECHOCARDIOGRAM REPORT   Patient Name:   [name]  Date of Exam: 08/10/2020 Medical Rec #:  [id number]  Height:       72.0 in Accession #:    [id number] Weight:       210.8 lb Date of Birth:  1936/01/04  BSA:          2.179 m Patient Age:    77 years   BP:           114/66 mmHg Patient Gender: M          HR:           80 bpm. Exam Location:  [facility] Procedure: 2D Echo, Color Doppler and Cardiac Doppler Indications:     R78.81 Bacteremia  History:         Patient has prior history of Echocardiogram examinations, most                  recent 04/23/2020. Prior CABG; Risk Factors:Hypertension and                  HCL.  Sonographer:     Charmayne Sheer RDCS (AE) Referring Phys:  [id number] Tsosie Billing Diagnosing Phys: Serafina Royals MD  Sonographer Comments: Technically difficult study due to poor echo windows. IMPRESSIONS  1. Left ventricular ejection fraction, by estimation, is 55 to 60%. The left ventricle has normal function. The left ventricle has no regional wall motion abnormalities. Left ventricular diastolic parameters were normal.  2. Right ventricular systolic function is normal. The right ventricular size is mildly enlarged.  3. Left atrial size was mildly dilated.  4. Right atrial size was mildly dilated.  5. The mitral valve is normal in structure. Mild mitral valve regurgitation.  6. The aortic valve is normal in structure. Aortic valve regurgitation is not visualized. FINDINGS  Left Ventricle: Left ventricular ejection fraction, by estimation, is 55 to 60%. The left ventricle has normal function. The left ventricle has no regional wall motion abnormalities. The left ventricular internal cavity size was normal in size. There is  no left ventricular  hypertrophy. Left ventricular diastolic parameters were normal. Right Ventricle: The right ventricular size is mildly enlarged. No increase in right ventricular wall thickness. Right ventricular systolic function is normal. Left Atrium: Left atrial size was mildly dilated. Right Atrium: Right atrial size was mildly dilated. Pericardium: There is no evidence of pericardial effusion. Mitral Valve: The mitral valve is normal in structure. Mild mitral valve regurgitation. MV peak gradient, 3.5 mmHg. The mean mitral valve gradient is 2.0 mmHg. Tricuspid Valve: The tricuspid valve is normal in structure. Tricuspid valve regurgitation is mild. Aortic Valve: The aortic valve is normal in structure. Aortic valve regurgitation is not visualized. Aortic valve mean gradient measures 4.0 mmHg. Aortic valve peak gradient measures 8.0 mmHg. Aortic valve area, by VTI measures 3.53 cm. Pulmonic Valve: The pulmonic valve was normal in structure. Pulmonic valve regurgitation is not visualized. Aorta: The aortic root and ascending aorta are structurally normal, with no evidence of dilitation. IAS/Shunts: No  atrial level shunt detected by color flow Doppler.  LEFT VENTRICLE PLAX 2D LVIDd:         4.80 cm     Diastology LVIDs:         3.80 cm     LV e' medial:    9.79 cm/s LV PW:         1.10 cm     LV E/e' medial:  6.6 LV IVS:        1.00 cm     LV e' lateral:   12.00 cm/s LVOT diam:     2.40 cm     LV E/e' lateral: 5.4 LV SV:         90 LV SV Index:   41 LVOT Area:     4.52 cm  LV Volumes (MOD) LV vol d, MOD A4C: 68.1 ml LV vol s, MOD A4C: 27.8 ml LV SV MOD A4C:     68.1 ml RIGHT VENTRICLE RV Basal diam:  3.40 cm LEFT ATRIUM             Index       RIGHT ATRIUM           Index LA diam:        3.70 cm 1.70 cm/m  RA Area:     25.90 cm LA Vol (A2C):   40.3 ml 18.50 ml/m RA Volume:   90.00 ml  41.31 ml/m LA Vol (A4C):   26.1 ml 11.98 ml/m LA Biplane Vol: 33.1 ml 15.19 ml/m  AORTIC VALVE                   PULMONIC VALVE AV Area  (Vmax):    3.59 cm    PV Vmax:       1.42 m/s AV Area (Vmean):   3.64 cm    PV Vmean:      95.300 cm/s AV Area (VTI):     3.53 cm    PV VTI:        0.242 m AV Vmax:           141.00 cm/s PV Peak grad:  8.1 mmHg AV Vmean:          96.200 cm/s PV Mean grad:  4.0 mmHg AV VTI:            0.255 m AV Peak Grad:      8.0 mmHg AV Mean Grad:      4.0 mmHg LVOT Vmax:         112.00 cm/s LVOT Vmean:        77.300 cm/s LVOT VTI:          0.199 m LVOT/AV VTI ratio: 0.78  AORTA Ao Root diam: 3.70 cm MITRAL VALVE MV Area (PHT): 3.87 cm    SHUNTS MV Area VTI:   3.56 cm    Systemic VTI:  0.20 m MV Peak grad:  3.5 mmHg    Systemic Diam: 2.40 cm MV Mean grad:  2.0 mmHg MV Vmax:       0.94 m/s MV Vmean:      60.2 cm/s MV Decel Time: 196 msec MV E velocity: 64.90 cm/s MV A velocity: 81.90 cm/s MV E/A ratio:  0.79 Serafina Royals MD Electronically signed by Serafina Royals MD Signature Date/Time: 08/12/2020/1:15:43 PM    Final     EKG: Sinus tachycardia with nonspecific ST changes  Weights: Filed Weights   08/11/20 0443 08/12/20 0434 08/12/20 1143  Weight: 96.2 kg 96.5 kg  96.5 kg     Physical Exam: Blood pressure 132/83, pulse (!) 59, temperature 98.1 F (36.7 C), resp. rate 16, height 6' (1.829 m), weight 96.5 kg, SpO2 97 %. Body mass index is 28.85 kg/m. General: Well developed, well nourished, in no acute distress. Head eyes ears nose throat: Normocephalic, atraumatic, sclera non-icteric, no xanthomas, nares are without discharge. No apparent thyromegaly and/or mass  Lungs: Normal respiratory effort.  Few wheezes, no rales, some rhonchi.  Heart: RRR with normal S1 S2. no murmur gallop, no rub, PMI is normal size and placement, carotid upstroke normal without bruit, jugular venous pressure is normal Abdomen: Soft, non-tender, non-distended with normoactive bowel sounds. No hepatomegaly. No rebound/guarding. No obvious abdominal masses. Abdominal aorta is normal size without bruit Extremities: 1-2+ right lower foot  edema.  Positive cyanosis, no clubbing, positive ulcers  Peripheral : 2+ bilateral upper extremity pulses, 2+ bilateral femoral pulses, 1+ bilateral dorsal pedal pulse Neuro: Alert and oriented. No facial asymmetry. No focal deficit. Moves all extremities spontaneously. Musculoskeletal: Normal muscle tone without kyphosis Psych:  Responds to questions appropriately with a normal affect.    Assessment: 85 year old male with known coronary disease status post coronary bypass graft hypertension hyperlipidemia peripheral vascular disease having a left lower leg cellulitis with MRSA bacteremia and concerns for endocarditis without evidence of congestive heart failure or myocardial infarction  Plan: 1.  Continue supportive care and treatment of MRSA bacteremia with antibiotics and treatment of cellulitis and ulcer as per podiatry 2.  Continuation of high intensity cholesterol therapy and antihypertensive medication management for coronary bypass grafting 3.  Continuation of furosemide daily for lower extremity edema and concerns of diastolic dysfunction congestive heart failure 4.  Proceed to transesophageal echocardiogram for further evaluation and treatment options of possible endocarditis which will occur Thursday  Signed, Corey Skains M.D. [facility] 08/12/2020, 5:38 PM

## 2020-08-12 NOTE — Plan of Care (Signed)
  Problem: Education: Goal: Knowledge of General Education information will improve Description: Including pain rating scale, medication(s)/side effects and non-pharmacologic comfort measures Outcome: Progressing   Problem: Health Behavior/Discharge Planning: Goal: Ability to manage health-related needs will improve Outcome: Progressing   Problem: Clinical Measurements: Goal: Ability to maintain clinical measurements within normal limits will improve Outcome: Progressing Goal: Will remain free from infection Outcome: Progressing Goal: Diagnostic test results will improve Outcome: Progressing Goal: Respiratory complications will improve Outcome: Progressing Goal: Cardiovascular complication will be avoided Outcome: Progressing   Problem: Activity: Goal: Risk for activity intolerance will decrease Outcome: Progressing   Problem: Nutrition: Goal: Adequate nutrition will be maintained Outcome: Progressing   Problem: Coping: Goal: Level of anxiety will decrease Outcome: Progressing   Problem: Elimination: Goal: Will not experience complications related to bowel motility Outcome: Progressing Goal: Will not experience complications related to urinary retention Outcome: Progressing   Problem: Pain Managment: Goal: General experience of comfort will improve Outcome: Progressing   Problem: Safety: Goal: Ability to remain free from injury will improve Outcome: Progressing   Problem: Skin Integrity: Goal: Risk for impaired skin integrity will decrease Outcome: Progressing   Problem: Fluid Volume: Goal: Hemodynamic stability will improve Outcome: Progressing   Problem: Clinical Measurements: Goal: Diagnostic test results will improve Outcome: Progressing Goal: Signs and symptoms of infection will decrease Outcome: Progressing   Problem: Respiratory: Goal: Ability to maintain adequate ventilation will improve Outcome: Progressing   Problem: Clinical  Measurements: Goal: Ability to avoid or minimize complications of infection will improve Outcome: Progressing   Problem: Skin Integrity: Goal: Skin integrity will improve Outcome: Progressing

## 2020-08-12 NOTE — Transfer of Care (Signed)
Immediate Anesthesia Transfer of Care Note  Patient: [name]  Procedure(s) Performed: DEBRIDEMENT WOUND-Left Heel (Left Heel)  Patient Location: PACU  Anesthesia Type:General  Level of Consciousness: drowsy  Airway & Oxygen Therapy: Patient Spontanous Breathing  Post-op Assessment: Report given to RN and Post -op Vital signs reviewed and stable  Post vital signs: Reviewed and stable  Last Vitals: see Epic PACU charting Vitals Value Taken Time  BP    Temp    Pulse    Resp    SpO2      Last Pain:  Vitals:   08/12/20 1143  TempSrc: Temporal  PainSc: 0-No pain         Complications: No complications documented.

## 2020-08-12 NOTE — Op Note (Addendum)
Operative note   Surgeon:Lian Tanori Lawyer: None    Preop diagnosis: Abscess left heel    Postop diagnosis: Same    Procedure: Debridement to muscle plantar and lateral left heel.  Placement of wound VAC    EBL: Minimal    Anesthesia:local and IV sedation.  Local consisted of a one-to-one mixture of 0.5% bupivacaine plain and 1% lidocaine with epinephrine.  A total of 20 cc was used    Hemostasis: Epinephrine    Specimen: Deep wound culture    Complications: None    Operative indications:[name] is an 85 y.o. that presents today for surgical intervention.  The risks/benefits/alternatives/complications have been discussed and consent has been given.    Procedure:  Patient was brought into the OR and placed on the operating table in thelateral position. After anesthesia was obtained theleft lower extremity was prepped and draped in usual sterile fashion.  Attention was directed to the lateral and plantar aspect of the left heel where a large abscessed area was noted.  There was purulent drainage noted.  A lateral incision was placed.  The wound was then opened and all necrotic fibrotic and nonviable infected tissue was excised.  Debridement was performed with a versa jet and combination of scissors as well.  This was taken down to the level of the bone but not including bone.  Obvious subcutaneous tissue and deep periosteum was encountered.  The length of the wound was 12 cm x 3 cm.  After final debridement a wound VAC was then applied to the lateral heel and wound.  Good seal was noted.    Patient tolerated the procedure and anesthesia well.  Was transported from the OR to the PACU with all vital signs stable and vascular status intact. To be discharged per routine protocol.  Return to the floor after discharge from PACU

## 2020-08-12 NOTE — Progress Notes (Signed)
Pharmacy Antibiotic Note  [name] is a 85 y.o. male admitted on 08/09/2020 with sepsis and wound infection of L foot with abscess. Subsequently found to have MRSA bacteremia. TTE pending, if negative will need TEE. ID and podiatry following. Wound vac placed 3/2. Pharmacy has been consulted for vancomycin and cefepime dosing.  Patient previously diagnosed with osteomyelitis on L foot and cultures that grew MRSA and PSA in Oct. 2021.  3/2 Vanc dose 1000 mg @ 0118. Pk 26 @ 0525. Tr 23 @ 1205.  Day 3 abx  Plan: Continue cefepime 2g IV q8h  Vancomycin dose changed to 1750 mg IV q24h --Predicted AUC: 523.6, Cmin 17.5 --Daily Scr per protocol while on vancomycin  Height: 6' (182.9 cm) Weight: 96.5 kg (212 lb 11.9 oz) IBW/kg (Calculated) : 77.6  Recent Labs  Lab 08/09/20 1549 08/09/20 1551 08/09/20 1804 08/09/20 2224 08/10/20 0503 08/11/20 0709 08/12/20 0525 08/12/20 1205  WBC  --  14.6*  --   --  11.3* 8.9 8.6  --   CREATININE  --  0.83  --  1.02 0.88 1.03 0.89  --   LATICACIDVEN 1.1  --  1.0  --   --   --   --   --   VANCOTROUGH  --   --   --   --   --   --   --  23*  VANCOPEAK  --   --   --   --   --   --  26*  --     Estimated Creatinine Clearance: 77.1 mL/min (by C-G formula based on SCr of 0.89 mg/dL).    Antimicrobials this admission: Metronidazole 2/27 x 1 Vancomycin 2/27 >> Cefepime 2/27 >>  Microbiology results: 2/27 BCx: 3/4 bottles MRSA 2/28 BCx: NGTD 2/28 MRSA PCR: (+) 3/1 WCx: staph aureus 3/2 WCx: pending  Benn Moulder, PharmD Pharmacy Resident  08/12/2020 2:56 PM

## 2020-08-13 ENCOUNTER — Encounter
Admission: EM | Disposition: A | Discharge status: Skilled Nursing Facility | Payer: Self-pay | Source: Home / Self Care | Attending: Obstetrics and Gynecology

## 2020-08-13 ENCOUNTER — Encounter: Payer: Self-pay | Admitting: Podiatry

## 2020-08-13 ENCOUNTER — Inpatient Hospital Stay
Admit: 2020-08-13 | Discharge: 2020-08-13 | Disposition: A | Discharge status: Home / Self Care | Payer: Medicare Other | Attending: Internal Medicine | Admitting: Internal Medicine

## 2020-08-13 ENCOUNTER — Ambulatory Visit: Payer: Medicare Other | Admitting: Physician Assistant

## 2020-08-13 ENCOUNTER — Inpatient Hospital Stay: Payer: Self-pay

## 2020-08-13 DIAGNOSIS — L02612 Cutaneous abscess of left foot: Secondary | ICD-10-CM

## 2020-08-13 DIAGNOSIS — L089 Local infection of the skin and subcutaneous tissue, unspecified: Secondary | ICD-10-CM | POA: Diagnosis not present

## 2020-08-13 DIAGNOSIS — B9562 Methicillin resistant Staphylococcus aureus infection as the cause of diseases classified elsewhere: Secondary | ICD-10-CM | POA: Diagnosis not present

## 2020-08-13 DIAGNOSIS — R7881 Bacteremia: Secondary | ICD-10-CM | POA: Diagnosis not present

## 2020-08-13 HISTORY — PX: TEE WITHOUT CARDIOVERSION: SHX5443

## 2020-08-13 LAB — CBC
HCT: 28.6 % — ABNORMAL LOW (ref 39.0–52.0)
Hemoglobin: 10 g/dL — ABNORMAL LOW (ref 13.0–17.0)
MCH: 36.2 pg — ABNORMAL HIGH (ref 26.0–34.0)
MCHC: 35 g/dL (ref 30.0–36.0)
MCV: 103.6 fL — ABNORMAL HIGH (ref 80.0–100.0)
Platelets: 247 10*3/uL (ref 150–400)
RBC: 2.76 MIL/uL — ABNORMAL LOW (ref 4.22–5.81)
RDW: 12.1 % (ref 11.5–15.5)
WBC: 5.6 10*3/uL (ref 4.0–10.5)
nRBC: 0 % (ref 0.0–0.2)

## 2020-08-13 LAB — BASIC METABOLIC PANEL
Anion gap: 9 (ref 5–15)
BUN: 13 mg/dL (ref 8–23)
CO2: 24 mmol/L (ref 22–32)
Calcium: 9.2 mg/dL (ref 8.9–10.3)
Chloride: 99 mmol/L (ref 98–111)
Creatinine, Ser: 0.83 mg/dL (ref 0.61–1.24)
GFR, Estimated: >60 mL/min (ref >60)
Glucose, Bld: 95 mg/dL (ref 70–99)
Potassium: 3.2 mmol/L — ABNORMAL LOW (ref 3.5–5.1)
Sodium: 132 mmol/L — ABNORMAL LOW (ref 135–145)

## 2020-08-13 LAB — MAGNESIUM: Magnesium: 1.4 mg/dL — ABNORMAL LOW (ref 1.7–2.4)

## 2020-08-13 SURGERY — ECHOCARDIOGRAM, TRANSESOPHAGEAL
Anesthesia: Moderate Sedation

## 2020-08-13 MED ORDER — FENTANYL CITRATE (PF) 100 MCG/2ML IJ SOLN
INTRAMUSCULAR | Status: AC
  Filled 2020-08-13: qty 2

## 2020-08-13 MED ORDER — LIDOCAINE VISCOUS HCL 2 % MT SOLN
OROMUCOSAL | Status: AC
  Filled 2020-08-13: qty 15

## 2020-08-13 MED ORDER — BUTAMBEN-TETRACAINE-BENZOCAINE 2-2-14 % EX AERO
INHALATION_SPRAY | CUTANEOUS | Status: AC
  Filled 2020-08-13: qty 5

## 2020-08-13 MED ORDER — POTASSIUM CHLORIDE CRYS ER 20 MEQ PO TBCR
40.0000 meq | EXTENDED_RELEASE_TABLET | ORAL | Status: AC
  Administered 2020-08-13 (×2): 40 meq via ORAL
  Filled 2020-08-13: qty 2

## 2020-08-13 MED ORDER — MIDAZOLAM HCL 5 MG/5ML IJ SOLN
INTRAMUSCULAR | Status: AC
  Filled 2020-08-13: qty 5

## 2020-08-13 MED ORDER — SODIUM CHLORIDE FLUSH 0.9 % IV SOLN
INTRAVENOUS | Status: AC
  Filled 2020-08-13: qty 10

## 2020-08-13 MED ORDER — SODIUM CHLORIDE 0.9 % IV SOLN: INTRAVENOUS | Status: DC

## 2020-08-13 MED ORDER — MAGNESIUM SULFATE 2 GM/50ML IV SOLN
2.0000 g | Freq: Once | INTRAVENOUS | Status: AC
  Administered 2020-08-13: 2 g via INTRAVENOUS
  Filled 2020-08-13: qty 50

## 2020-08-13 MED ORDER — POTASSIUM CHLORIDE CRYS ER 20 MEQ PO TBCR
40.0000 meq | EXTENDED_RELEASE_TABLET | Freq: Every day | ORAL | Status: DC
  Administered 2020-08-14 – 2020-08-18 (×5): 40 meq via ORAL
  Filled 2020-08-13 (×5): qty 2

## 2020-08-13 MED ORDER — SODIUM CHLORIDE 0.9% FLUSH
10.0000 mL | Freq: Two times a day (BID) | INTRAVENOUS | Status: DC
  Administered 2020-08-13 – 2020-08-18 (×10): 10 mL

## 2020-08-13 MED ORDER — FENTANYL CITRATE (PF) 100 MCG/2ML IJ SOLN
INTRAMUSCULAR | Status: AC | PRN
  Administered 2020-08-13: 25 ug via INTRAVENOUS
  Administered 2020-08-13: 50 ug via INTRAVENOUS

## 2020-08-13 MED ORDER — LINEZOLID 600 MG PO TABS
600.0000 mg | ORAL_TABLET | Freq: Two times a day (BID) | ORAL | Status: DC
  Administered 2020-08-13 – 2020-08-14 (×2): 600 mg via ORAL
  Filled 2020-08-13 (×3): qty 1

## 2020-08-13 MED ORDER — SODIUM CHLORIDE 0.9% FLUSH: 10.0000 mL | INTRAVENOUS | Status: DC | PRN

## 2020-08-13 MED ORDER — MIDAZOLAM HCL 5 MG/5ML IJ SOLN
INTRAMUSCULAR | Status: AC | PRN
  Administered 2020-08-13: 1 mg via INTRAVENOUS
  Administered 2020-08-13: 2 mg via INTRAVENOUS

## 2020-08-13 NOTE — Anesthesia Postprocedure Evaluation (Signed)
Anesthesia Post Note  Patient: [name]  Procedure(s) Performed: DEBRIDEMENT WOUND-Left Heel (Left Heel) APPLICATION OF WOUND VAC (Left Foot)  Patient location during evaluation: PACU Anesthesia Type: General Level of consciousness: awake and alert Pain management: pain level controlled Vital Signs Assessment: post-procedure vital signs reviewed and stable Respiratory status: spontaneous breathing, nonlabored ventilation and respiratory function stable Cardiovascular status: blood pressure returned to baseline and stable Postop Assessment: no apparent nausea or vomiting Anesthetic complications: no   No complications documented.   Last Vitals:  Vitals:   08/13/20 0504 08/13/20 0751  BP: 118/68 126/75  Pulse: 71 72  Resp: 16 17  Temp: (!) 36.4 C (!) 36.2 C  SpO2: 100% 97%    Last Pain:  Vitals:   08/13/20 0751  TempSrc: Oral  PainSc: 0-No pain                 Brett Canales Ramsdell

## 2020-08-13 NOTE — Evaluation (Signed)
Physical Therapy Evaluation Patient Details Name: [name] MRN: [id number] DOB: 05-12-1936 Today's Date: 08/13/2020   History of Present Illness  [name] is a 85 y.o. 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  Patient was seen in urgent care.  Patient states that he had left leg redness that is been getting worse over the past 4 days.  He states that he is not able to feel a lot of pain secondary to his neuropathy.  He states that he had a wound on the bottom of his heel but is unsure how he got there.  The redness has gotten worse, nothing makes it better, nothing makes it worse.  He states that he has not been on any antibiotics.  Patient noted to have a temperature of 37.2 and was tachycardic.  Patient was sent over here for IV antibiotics and hospital admission due to concern for sepsis.  Patient is not on any blood thinners.  Clinical Impression   Patient received in bed, slightly agitated, multiple comments regarding things not working. Patient required me to call RN to agree to get out of bed. States he has been told by everyone not to get out of bed. Once he spoke to RN he is agreeable to mobility. Patient requires cues for L NWB status. He performed pivot to recliner from elevated bed with max assist. He was unable to maintain NWB during transfer. Patient will continue to benefit from skilled PT while here to improve mobility, safety and strength.       Follow Up Recommendations SNF    Equipment Recommendations  Wheelchair cushion (measurements PT);Wheelchair (measurements PT)    Recommendations for Other Services       Precautions / Restrictions Precautions Precautions: Fall Restrictions Weight Bearing Restrictions: Yes LLE Weight Bearing: Non weight bearing      Mobility  Bed Mobility Overal bed mobility: Modified Independent                  Transfers Overall transfer level: Needs assistance Equipment used:  None Transfers: Squat Pivot Transfers     Squat pivot transfers: Max assist     General transfer comment: patient transferred from elevated bed to his right side to recliner. Max assist and patient was unable to keep left foot off floor during transfer. Cues for hand placement  Ambulation/Gait             General Gait Details: unable  Stairs            Wheelchair Mobility    Modified Rankin (Stroke Patients Only)       Balance Overall balance assessment: Needs assistance Sitting-balance support: Feet supported;Single extremity supported Sitting balance-Leahy Scale: Fair     Standing balance support: Bilateral upper extremity supported;During functional activity Standing balance-Leahy Scale: Poor Standing balance comment: patient reliant on UE assist and max assist to transfer to recliner                             Pertinent Vitals/Pain Pain Assessment: No/denies pain    Home Living Family/patient expects to be discharged to:: Private residence Living Arrangements: Spouse/significant other Available Help at Discharge: Family;Available 24 hours/day Type of Home: House Home Access: Ramped entrance     Home Layout: One level Home Equipment: Walker - 2 wheels      Prior Function Level of Independence: Independent with assistive device(s)  Comments: states he was driving, using RW     Hand Dominance        Extremity/Trunk Assessment   Upper Extremity Assessment Upper Extremity Assessment: Defer to OT evaluation    Lower Extremity Assessment Lower Extremity Assessment: Generalized weakness    Cervical / Trunk Assessment Cervical / Trunk Assessment: Normal  Communication   Communication: No difficulties  Cognition Arousal/Alertness: Awake/alert Behavior During Therapy: Agitated Overall Cognitive Status: Within Functional Limits for tasks assessed                                 General Comments:  patient generally gruff, multiple complaints, required me to call RN to believe that he was allowed to get oob.      General Comments      Exercises     Assessment/Plan    PT Assessment Patient needs continued PT services  PT Problem List Decreased strength;Decreased mobility;Decreased activity tolerance;Decreased knowledge of precautions       PT Treatment Interventions Therapeutic exercise;Functional mobility training;Therapeutic activities;Patient/family education    PT Goals (Current goals can be found in the Care Plan section)  Acute Rehab PT Goals Patient Stated Goal: none stated PT Goal Formulation: With patient Time For Goal Achievement: 08/20/20    Frequency Min 2X/week   Barriers to discharge Decreased caregiver support      Co-evaluation               AM-PAC PT "6 Clicks" Mobility  Outcome Measure Help needed turning from your back to your side while in a flat bed without using bedrails?: None Help needed moving from lying on your back to sitting on the side of a flat bed without using bedrails?: None Help needed moving to and from a bed to a chair (including a wheelchair)?: A Lot Help needed standing up from a chair using your arms (e.g., wheelchair or bedside chair)?: A Lot Help needed to walk in hospital room?: Total Help needed climbing 3-5 steps with a railing? : Total 6 Click Score: 14    End of Session   Activity Tolerance: Patient tolerated treatment well;Patient limited by fatigue Patient left: in chair;with chair alarm set;with call bell/phone within reach;Other (comment) (wound care RN in room to replace wound vac as I left.) Nurse Communication: Mobility status;Weight bearing status PT Visit Diagnosis: Other abnormalities of gait and mobility (R26.89);Muscle weakness (generalized) (M62.81)    Time: 6468-0321 PT Time Calculation (min) (ACUTE ONLY): 23 min   Charges:   PT Evaluation $PT Eval Moderate Complexity: 1 Mod PT  Treatments $Therapeutic Activity: 8-22 mins        Meghin Thivierge, PT, GCS 08/13/20,11:34 AM

## 2020-08-13 NOTE — Sedation Documentation (Signed)
Multiple documentation for procedure timeout 815 sedation started at 816, complete procedure at 0836 total time was 21 min.

## 2020-08-13 NOTE — Consult Note (Addendum)
WOC Nurse Consult Note: Pt had surgery performed on 3/2 by Dr Vickki Muff of the podiatry team. Notified by bedside nurse that Vac is alarming.  Pt returned from a procedure and the track pad had been accidentally pulled off.  The current Vac sponge and drape was saturated with bloody drainage and is clogged.  New Vac dressing replaced.  Full thickness post-op wound to left heel is beefy red; 8X2X.8cm, with sutures to plantar foot surrounding which are approximated and intact.  There is a small amt bloody drainage to the inner wound. Skin surrounding wound is white and macerated, loose and peeling. Pt denies c/o pain with the procedure. Applied one piece black foam to 142mm cont suction.  Bridged track pad to anterior foot. Cannister was replaced since filter was clogged; 60cc bloody drainage in the previous cannister. Discussed plan of care with Dr Vickki Muff via secure chat, perform next Vac dressing on Mon, 3/7 and  begin a M/W/F schedule at that time.  Julien Girt MSN, RN, [place], [place], [place]

## 2020-08-13 NOTE — Progress Notes (Signed)
[facility] Encounter Note  Patient: [name] / Admit Date: 08/09/2020 / Date of Encounter: 08/13/2020, 8:40 AM   Subjective: Patient overall feels relatively well today.  No evidence of cardiac symptoms of chest discomfort or shortness of breath or congestive heart failure.  Patient has not had any angina.  Telemetry has shown normal sinus rhythm.  The patient does have left leg cellulitis with improving bacteremia.  Transesophageal echocardiogram showing normal LV systolic function with ejection fraction of 55% with no evidence of vegetation or endocarditis.  There is calcification of mitral cords but no evidence of endocarditis.  Mild mitral and tricuspid regurgitation with mild biatrial enlargement.  Moderate aortic atherosclerosis  Review of Systems: Positive for: Left leg pain and swelling Negative for: Vision change, hearing change, syncope, dizziness, nausea, vomiting,diarrhea, bloody stool, stomach pain, cough, congestion, diaphoresis, urinary frequency, urinary pain,skin lesions, skin rashes Others previously listed  Objective: Telemetry: Normal sinus rhythm Physical Exam: Blood pressure (!) 110/59, pulse 89, temperature (!) 97.1 F (36.2 C), temperature source Oral, resp. rate 16, height 6' (1.829 m), weight 98 kg, SpO2 95 %. Body mass index is 29.3 kg/m. General: Well developed, well nourished, in no acute distress. Head: Normocephalic, atraumatic, sclera non-icteric, no xanthomas, nares are without discharge. Neck: No apparent masses Lungs: Normal respirations with few wheezes, no rhonchi, no rales , no crackles   Heart: Regular rate and rhythm, normal S1 S2, no murmur, no rub, no gallop, PMI is normal size and placement, carotid upstroke normal without bruit, jugular venous pressure normal Abdomen: Soft, non-tender, non-distended with normoactive bowel sounds. No hepatosplenomegaly. Abdominal aorta is normal size without bruit Extremities: 2+ left leg and  foot edema, no clubbing, no cyanosis, positive ulcers,  Peripheral: 2+ radial, 2+ femoral, 1+ dorsal pedal pulses Neuro: Alert and oriented. Moves all extremities spontaneously. Psych:  Responds to questions appropriately with a normal affect.   Intake/Output Summary (Last 24 hours) at 08/13/2020 0840 Last data filed at 08/12/2020 1536 Gross per 24 hour  Intake 1150 ml  Output --  Net 1150 ml    Inpatient Medications:  . [MAR Hold] amLODipine  2.5 mg Oral Daily  . [MAR Hold] aspirin EC  81 mg Oral Daily  . [MAR Hold] azelastine  1 spray Each Nare BID  . [MAR Hold] B-complex with vitamin C  1 tablet Oral Daily  . butamben-tetracaine-benzocaine      . [MAR Hold] calcium citrate-vitamin D  1 tablet Oral Daily  . [MAR Hold] Chlorhexidine Gluconate Cloth  6 each Topical Q0600  . [MAR Hold] enoxaparin (LOVENOX) injection  40 mg Subcutaneous Q24H  . fentaNYL      . [MAR Hold] folic acid  1 mg Oral Daily  . [MAR Hold] furosemide  40 mg Oral Daily  . [MAR Hold] gabapentin  1,200 mg Oral BID  . lidocaine      . [MAR Hold] melatonin  5 mg Oral QHS  . midazolam      . [MAR Hold] pantoprazole  40 mg Oral Daily  . [MAR Hold] pravastatin  80 mg Oral Daily  . [MAR Hold] primidone  50 mg Oral TID  . sodium chloride flush      . [MAR Hold] tretinoin  1 application Topical QHS  . [MAR Hold] triamcinolone  1 application Topical BID  . [MAR Hold] cyanocobalamin  1,000 mcg Oral Daily   Infusions:  . [MAR Hold] sodium chloride Stopped (08/10/20 1753)  . sodium chloride 20 mL/hr at 08/13/20  69  . [MAR Hold] ceFEPime (MAXIPIME) IV 2 g (08/12/20 2249)  . [MAR Hold] vancomycin      Labs: Recent Labs    08/11/20 0709 08/12/20 0525 08/13/20 0715  NA 130* 129* 132*  K 3.6 3.5 3.2*  CL 98 97* 99  CO2 22 24 24   GLUCOSE 87 85 95  BUN 23 18 13   CREATININE 1.03 0.89 0.83  CALCIUM 9.1 9.2 9.2  MG 1.8  --   --    No results for input(s): AST, ALT, ALKPHOS, BILITOT, PROT, ALBUMIN in the last 72  hours. Recent Labs    08/12/20 0525 08/13/20 0715  WBC 8.6 5.6  HGB 10.0* 10.0*  HCT 28.5* 28.6*  MCV 104.0* 103.6*  PLT 226 247   No results for input(s): CKTOTAL, CKMB, TROPONINI in the last 72 hours. Invalid input(s): POCBNP No results for input(s): HGBA1C in the last 72 hours.   Weights: Filed Weights   08/12/20 1143 08/13/20 0504 08/13/20 0751  Weight: 96.5 kg 98 kg 98 kg     Radiology/Studies:  MR ANKLE LEFT W WO CONTRAST  Result Date: 08/09/2020 CLINICAL DATA:  Left leg swelling and redness, question of osteomyelitis EXAM: MRI OF THE LEFT ANKLE WITHOUT AND WITH CONTRAST TECHNIQUE: Multiplanar, multisequence MR imaging of the ankle was performed before and after the administration of intravenous contrast. CONTRAST:  46mL GADAVIST GADOBUTROL 1 MMOL/ML IV SOLN COMPARISON:  Radiograph same day FINDINGS: Bones/Joint/Cartilage Osteoarthritis is seen at the first medial cuneiform metatarsal joint with joint space loss and subchondral cystic changes. Minimally increased signal seen within the lateral malleolus. No areas of cortical destruction or periosteal reaction. No areas of abnormal enhancement are seen throughout the osseous structures. Tibiotalar joint osteoarthritis is seen with joint space loss and diffuse chondral loss with subchondral marrow signal changes seen anteriorly. A small ankle joint effusion is seen. Ligaments Attenuation with increased signal seen within the anterior talofibular ligament. There is also slight thickening of the spring ligament, however it is intact. The remainder of the ligaments appear to be intact. Muscles and Tendons Mildly increased signal seen within the multiple on the plantar surface. No focal atrophy or tear however is noted. A small amount of fluid is seen surrounding the posterior tibialis and peroneal tendons, however they are intact. There is minimally increased signal seen at the insertion site of the Achilles tendon with mild adjacent  reactive marrow. Soft tissue There is diffuse subcutaneous edema and skin thickening seen surrounding the ankle and dorsum of the hindfoot. No loculated fluid collections are noted. No areas of abnormal enhancement are seen. IMPRESSION: IMPRESSION 1. Findings suggestive of diffuse cellulitis surrounding the ankle and hindfoot. No evidence of soft tissue abscess or osteomyelitis 2. Intrasubstance sprain and partial tear of the anterior talofibular ligament, however there are intact fibers throughout 3. Small ankle joint effusion 4. 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 Mild insertional Achilles tendinosis Electronically Signed   By: Prudencio Pair M.D.   On: 08/09/2020 22:03   US Venous Img Lower Unilateral Left  Result Date: 08/09/2020 CLINICAL DATA:  Left lower extremity edema and pain. EXAM: LEFT LOWER EXTREMITY VENOUS DOPPLER ULTRASOUND TECHNIQUE: Gray-scale sonography with compression, as well as color and duplex ultrasound, were performed to evaluate the deep venous system(s) from the level of the common femoral vein through the popliteal and proximal calf veins. COMPARISON:  None. FINDINGS: VENOUS Normal compressibility of the common femoral, superficial femoral, and popliteal veins, as well as the visualized  calf veins. Visualized portions of profunda femoral vein and great saphenous vein unremarkable. No filling defects to suggest DVT on grayscale or color Doppler imaging. Doppler waveforms show normal direction of venous flow, normal respiratory plasticity and response to augmentation. Limited views of the contralateral common femoral vein are unremarkable. OTHER None. Limitations: none IMPRESSION: Negative. Electronically Signed   By: Fidela Salisbury M.D.   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 No cortical erosion or destruction. There is no evidence of fracture or dislocation. There is no evidence of severe arthropathy or other focal bone abnormality. Subcutaneus soft tissue edema. Vascular calcifications. IMPRESSION: 1. 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 2. No acute displaced fracture or dislocation of the bones of the left foot. Electronically Signed   By: Iven Finn M.D.   On: 08/09/2020 18:30   ECHOCARDIOGRAM COMPLETE  Result Date: 08/12/2020    ECHOCARDIOGRAM REPORT   Patient Name:   HONDO NANDA  Date of Exam: 08/10/2020 Medical Rec #:  [id number]  Height:       72.0 in Accession #:    [id number] Weight:       210.8 lb Date of Birth:  04-Mar-1936  BSA:          2.179 m Patient Age:    28 years   BP:           114/66 mmHg Patient Gender: M          HR:           80 bpm. Exam Location:  [facility] Procedure: 2D Echo, Color Doppler and Cardiac Doppler Indications:     R78.81 Bacteremia  History:         Patient has prior history of Echocardiogram examinations, most                  recent 04/23/2020. Prior CABG; Risk Factors:Hypertension and                  HCL.  Sonographer:     Charmayne Sheer RDCS (AE) Referring Phys:  [id number] Tsosie Billing Diagnosing Phys: Serafina Royals MD  Sonographer Comments: Technically difficult study due to poor echo windows. IMPRESSIONS  1. Left ventricular ejection fraction, by estimation, is 55 to 60%. The left ventricle has normal function. The left ventricle has no regional wall motion abnormalities. Left ventricular diastolic parameters were normal.  2. Right ventricular systolic function is normal. The right ventricular size is mildly enlarged.  3. Left atrial size was mildly dilated.  4. Right atrial size was mildly dilated.  5. The mitral valve is normal in structure. Mild mitral valve regurgitation.  6. The aortic valve is normal in structure. Aortic valve  regurgitation is not visualized. FINDINGS  Left Ventricle: Left ventricular ejection fraction, by estimation, is 55 to 60%. The left ventricle has normal function. The left ventricle has no regional wall motion abnormalities. The left ventricular internal cavity size was normal in size. There is  no left ventricular hypertrophy. Left ventricular diastolic parameters were normal. Right Ventricle: The right ventricular size is mildly enlarged. No increase in right ventricular wall thickness. Right ventricular systolic function is normal. Left Atrium: Left atrial size was mildly dilated. Right Atrium: Right atrial size was  mildly dilated. Pericardium: There is no evidence of pericardial effusion. Mitral Valve: The mitral valve is normal in structure. Mild mitral valve regurgitation. MV peak gradient, 3.5 mmHg. The mean mitral valve gradient is 2.0 mmHg. Tricuspid Valve: The tricuspid valve is normal in structure. Tricuspid valve regurgitation is mild. Aortic Valve: The aortic valve is normal in structure. Aortic valve regurgitation is not visualized. Aortic valve mean gradient measures 4.0 mmHg. Aortic valve peak gradient measures 8.0 mmHg. Aortic valve area, by VTI measures 3.53 cm. Pulmonic Valve: The pulmonic valve was normal in structure. Pulmonic valve regurgitation is not visualized. Aorta: The aortic root and ascending aorta are structurally normal, with no evidence of dilitation. IAS/Shunts: No atrial level shunt detected by color flow Doppler.  LEFT VENTRICLE PLAX 2D LVIDd:         4.80 cm     Diastology LVIDs:         3.80 cm     LV e' medial:    9.79 cm/s LV PW:         1.10 cm     LV E/e' medial:  6.6 LV IVS:        1.00 cm     LV e' lateral:   12.00 cm/s LVOT diam:     2.40 cm     LV E/e' lateral: 5.4 LV SV:         90 LV SV Index:   41 LVOT Area:     4.52 cm  LV Volumes (MOD) LV vol d, MOD A4C: 68.1 ml LV vol s, MOD A4C: 27.8 ml LV SV MOD A4C:     68.1 ml RIGHT VENTRICLE RV Basal diam:  3.40 cm LEFT  ATRIUM             Index       RIGHT ATRIUM           Index LA diam:        3.70 cm 1.70 cm/m  RA Area:     25.90 cm LA Vol (A2C):   40.3 ml 18.50 ml/m RA Volume:   90.00 ml  41.31 ml/m LA Vol (A4C):   26.1 ml 11.98 ml/m LA Biplane Vol: 33.1 ml 15.19 ml/m  AORTIC VALVE                   PULMONIC VALVE AV Area (Vmax):    3.59 cm    PV Vmax:       1.42 m/s AV Area (Vmean):   3.64 cm    PV Vmean:      95.300 cm/s AV Area (VTI):     3.53 cm    PV VTI:        0.242 m AV Vmax:           141.00 cm/s PV Peak grad:  8.1 mmHg AV Vmean:          96.200 cm/s PV Mean grad:  4.0 mmHg AV VTI:            0.255 m AV Peak Grad:      8.0 mmHg AV Mean Grad:      4.0 mmHg LVOT Vmax:         112.00 cm/s LVOT Vmean:        77.300 cm/s LVOT VTI:          0.199 m LVOT/AV VTI ratio: 0.78  AORTA Ao Root diam: 3.70 cm MITRAL VALVE MV Area (PHT): 3.87 cm    SHUNTS  MV Area VTI:   3.56 cm    Systemic VTI:  0.20 m MV Peak grad:  3.5 mmHg    Systemic Diam: 2.40 cm MV Mean grad:  2.0 mmHg MV Vmax:       0.94 m/s MV Vmean:      60.2 cm/s MV Decel Time: 196 msec MV E velocity: 64.90 cm/s MV A velocity: 81.90 cm/s MV E/A ratio:  0.79 Serafina Royals MD Electronically signed by Serafina Royals MD Signature Date/Time: 08/12/2020/1:15:43 PM    Final      Assessment and Recommendation  85 y.o. male with known coronary artery disease status post coronary to bypass graft hypertension hyperlipidemia with acute left leg cellulitis and MRSA bacteremia now improved with lower foot and heel wound VAC and antibiotics without evidence of vegetation and or endocarditis at this time 1.  Continue wound VAC and treatment of bacteremia as necessary without restriction 2.  No further cardiac intervention and/or diagnostics necessary at this time due to no evidence of vegetation and/or endocarditis or congestive heart failure or angina 3.  Continue risk modification with hypertension control with current medical regimens without change 4.  No restrictions  to rehabilitation from above 5.  Call if further questions  Signed, Serafina Royals M.D. FACC

## 2020-08-13 NOTE — Plan of Care (Signed)
Wound vac leak fixed. Wound vac running without any alarms at this time

## 2020-08-13 NOTE — Progress Notes (Signed)
OT Cancellation Note  Patient Details Name: [name] MRN: [id number] DOB: May 29, 1936   Cancelled Treatment:    Reason Eval/Treat Not Completed: Patient at procedure or test/ unavailable. Chart reviewed. Upon arrival RN at bedside discussing PICC with pt/spouse. Following attempt pt reports he just got back to bed, refuses OOB assessment at this time. Will follow up at later date/time as able.   Dessie Coma, M.S. OTR/L  08/13/20, 4:17 PM  ascom [phone]

## 2020-08-13 NOTE — Progress Notes (Signed)
Peripherally Inserted Central Catheter Placement  The IV Nurse has discussed with the patient and/or persons authorized to consent for the patient, the purpose of this procedure and the potential benefits and risks involved with this procedure.  The benefits include less needle sticks, lab draws from the catheter, and the patient may be discharged home with the catheter. Risks include, but not limited to, infection, bleeding, blood clot (thrombus formation), and puncture of an artery; nerve damage and irregular heartbeat and possibility to perform a PICC exchange if needed/ordered by physician.  Alternatives to this procedure were also discussed.  Bard Power PICC patient education guide, fact sheet on infection prevention and patient information card has been provided to patient /or left at bedside.    PICC Placement Documentation  PICC Single Lumen 08/13/20 PICC Right Brachial 40 cm 0 cm (Active)  Indication for Insertion or Continuance of Line Home intravenous therapies (PICC only) 08/13/20 1828  Exposed Catheter (cm) 0 cm 08/13/20 1828  Site Assessment Clean;Dry;Intact 08/13/20 1828  Line Status Flushed;Saline locked;Blood return noted 08/13/20 1828  Dressing Type Transparent 08/13/20 1828  Dressing Status Clean;Dry;Intact 08/13/20 1828  Antimicrobial disc in place? Yes 08/13/20 1828  Safety Lock Not Applicable 00/17/49 4496  Line Care Tubing changed;Connections checked and tightened 08/13/20 1828  Line Adjustment (NICU/IV Team Only) No 08/13/20 1828  Dressing Intervention New dressing 08/13/20 1828  Dressing Change Due 08/20/20 08/13/20 1828       Rolena Infante 08/13/2020, 6:30 PM

## 2020-08-13 NOTE — CV Procedure (Signed)
Transesophageal echocardiogram preliminary report  [name] [id number] [birth year]  Preliminary diagnosis  Bacteremia with possible endocarditis  Postprocedural diagnosis Normal LV function without evidence of endocarditis or vegetation  Time out A timeout was performed by the nursing staff and physicians specifically identifying the procedure performed, identification of the patient, the type of sedation, all allergies and medications, all pertinent medical history, and presedation assessment of nasopharynx. The patient and or family understand the risks of the procedure including the rare risks of death, stroke, heart attack, esophogeal perforation, sore throat, and reaction to medications given.  Moderate sedation During this procedure the patient has received Versed 3 milligrams and fentanyl 75 micrograms to achieve appropriate moderate sedation.  The patient had continued monitoring of heart rate, oxygenation, blood pressure, respiratory rate, and extent of signs of sedation throughout the entire procedure.  The patient received this moderate sedation over a period of 21 minutes.  Both the nursing staff and I were present during the procedure when the patient had moderate sedation for 100% of the time.  Treatment considerations  No additional treatment considerations needed for bacteremia due to no current evidence of endocarditis  For further details of transesophageal echocardiogram please refer to final report.  Signed,  Corey Skains M.D. [facility] 08/13/2020 8:39 AM

## 2020-08-13 NOTE — Progress Notes (Signed)
Spoke to pt and wife at length re PICC line.  Consent signed, but states prefers to wait to place until seen by ID.  Wife in agreement.  Wade RN notified and will send secure chat once seen by ID.

## 2020-08-13 NOTE — Progress Notes (Signed)
PROGRESS NOTE    [name]   DGU:[id number]  DOB: 07/23/1935  PCP: [name], DO    DOA: 08/09/2020 LOS: 3   Brief Narrative   Summary of HPI on admission: 85 y.o. male with medical history significant for idiopathic peripheral neuropathy, hypertension, hyperlipidemia, GERD, presented to the ED on 08/09/2020 for chief concerns of worsening left leg pain since 2/24.  No preceding trauma.  No fevers or chills.  ED course: Temp 100.59F, tachycardic HR 106, respirations 23.  Labs notable for hyponatremia 125, hypokalemia 2.7, hypochloremia 86, hypomagnesemia 1.5, leukocytosis 14.6, mild anemia hemoglobin 12.7 macrocytic.  Negative COVID-19 and influenza's on PCR.  UA with moderate hemoglobin but negative for infection.  Blood cultures were drawn, returned positive for MRSA.  Hospital course to date: Admitted to hospitalist service for further evaluation and management of sepsis due to left foot cellulitis and multiple electrolyte derangements.  Left lower extremity venous US negative for DVT. 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 MRI left ankle negative for signs of abscess or osteomyelitis, but showed diffuse cellulitis of the ankle and hindfoot, sprain and partial tear of the ATFL and small joint effusion.  On broad-spectrum antibiotic coverage with vancomycin and cefepime.  Infectious disease consulted.   Assessment & Plan   Active Problems:   Sepsis ([place])   MRSA bacteremia    Sepsis secondary to MRSA bacteremia and Left Foot Cellulitis with abscess Sepsis POA as evidenced by leukocytosis, tachycardia, tachypnea.  Treated per protocol in the ED. S/p I and D by podiatry on 3/2. TTE/TEE w/o signs vegetations - continue wound vac, will need MWF changes per wound nurse --ID consulted --Continue Vanc and Cefepime, will touch base w/ ID about PICC and duration, etc. Podiatry  wants to keep at least one more day to assess healing - pt/ot consulted, advises SNF, pt declines, will need home health  Hypokalemia - suspect secondary to diuretics k 3.2 today - f/u mg - 80 of k and start 40 qd  Hyponatremia - POA, chronic. May be 2/2 thiazide. Improving off thiazide - hold hctz, monitor  Chronic systolic CHF / CAD / MR / TR - all appear stable.  Monitor volume status, renal function, electrolytes --Continue ASA --cont home lasix  Hypotension POA / Hx of Hypertension -resumed amlodipine 2.5 mg, lasix -Held: home HCTZ -Hydralazine PRN  Hyperlipidemia-pravastatin 80 mg daily  Idiopathic peripheral neuropathy-resumed gabapentin 1200 mg twice daily p.o.  Insomnia-melatonin 5 mg daily at bedtime  GERD-PPI   DVT prophylaxis: enoxaparin (LOVENOX) injection 40 mg Start: 08/09/20 2000 Place TED hose Start: 08/09/20 1758   Diet:  Diet Orders (From admission, onward)    Start     Ordered   08/13/20 9563  Diet regular Room service appropriate? Yes; Fluid consistency: Thin  Diet effective now       Question Answer Comment  Room service appropriate? Yes   Fluid consistency: Thin      08/13/20 0922            Code Status: Full Code    Subjective 08/13/20    Feeling well, no pain, has appetite, no n/v/d.   Disposition Plan & Communication   Status is: Inpatient  Inpatient status appropriate due to severity of illness on IV antibiotics for MRSA bacteremia.  Dispo: The patient is from: Home              Anticipated d/c is  to: home, declines snf              Patient currently is NOT medically stable for d/c.   Difficult to place patient No   Family Communication: wife at bedside on rounds   Consults, Procedures, Significant Events   Consultants:   Infectious Disease  Podiatry  Procedures:   None  Antimicrobials:  Anti-infectives (From admission, onward)   Start     Dose/Rate Route Frequency Ordered Stop   08/13/20 1000   vancomycin (VANCOREADY) IVPB 1750 mg/350 mL        1,750 mg 175 mL/hr over 120 Minutes Intravenous Every 24 hours 08/12/20 1446     08/12/20 1157  vancomycin (VANCOCIN) 1-5 GM/200ML-% IVPB       Note to Pharmacy: [place]  : cabinet override      08/12/20 1157 08/12/20 2359   08/10/20 1100  vancomycin (VANCOREADY) IVPB 1000 mg/200 mL  Status:  Discontinued        1,000 mg 200 mL/hr over 60 Minutes Intravenous Every 12 hours 08/09/20 1905 08/12/20 1254   08/09/20 2345  ceFEPIme (MAXIPIME) 2 g in sodium chloride 0.9 % 100 mL IVPB  Status:  Discontinued        2 g 200 mL/hr over 30 Minutes Intravenous Every 8 hours 08/09/20 1852 08/09/20 1854   08/09/20 2345  ceFEPIme (MAXIPIME) 2 g in sodium chloride 0.9 % 100 mL IVPB        2 g 200 mL/hr over 30 Minutes Intravenous Every 8 hours 08/09/20 1854     08/09/20 1930  vancomycin (VANCOCIN) IVPB 1000 mg/200 mL premix  Status:  Discontinued        1,000 mg 200 mL/hr over 60 Minutes Intravenous  Once 08/09/20 1852 08/09/20 1854   08/09/20 1930  vancomycin (VANCOCIN) IVPB 1000 mg/200 mL premix        1,000 mg 200 mL/hr over 60 Minutes Intravenous  Once 08/09/20 1854 08/10/20 0020   08/09/20 1600  ceFEPIme (MAXIPIME) 2 g in sodium chloride 0.9 % 100 mL IVPB        2 g 200 mL/hr over 30 Minutes Intravenous  Once 08/09/20 1555 08/09/20 1636   08/09/20 1600  metroNIDAZOLE (FLAGYL) IVPB 500 mg        500 mg 100 mL/hr over 60 Minutes Intravenous  Once 08/09/20 1555 08/09/20 1750   08/09/20 1600  vancomycin (VANCOCIN) IVPB 1000 mg/200 mL premix        1,000 mg 200 mL/hr over 60 Minutes Intravenous  Once 08/09/20 1555 08/09/20 1926        Micro    Objective   Vitals:   08/13/20 0845 08/13/20 0900 08/13/20 0910 08/13/20 1131  BP: (!) 98/54 132/72 130/80 119/74  Pulse: 69 72 78 79  Resp: 13 18 14 20   Temp:    98.3 F (36.8 C)  TempSrc:      SpO2: 94% 97% 95% 100%  Weight:      Height:        Intake/Output Summary (Last 24  hours) at 08/13/2020 1321 Last data filed at 08/13/2020 1302 Gross per 24 hour  Intake 1150 ml  Output 960 ml  Net 190 ml   Filed Weights   08/12/20 1143 08/13/20 0504 08/13/20 0751  Weight: 96.5 kg 98 kg 98 kg    Physical Exam:  General exam: awake, alert, no acute distress Respiratory system: symmetric chest rise, normal respiratory effort, on room air. Cardiovascular system: RRR, no RLE edema,  LLE edema due to cellulitis.   Central nervous system: A&O x3. no gross focal neurologic deficits, normal speech Extremities: left foot wound vac applied, erythematous and edematous to mid calf, unchanged Psychiatry: normal mood, congruent affect, judgement and insight appear normal  Labs   Data Reviewed: I have personally reviewed following labs and imaging studies  CBC: Recent Labs  Lab 08/09/20 1551 08/10/20 0503 08/11/20 0709 08/12/20 0525 08/13/20 0715  WBC 14.6* 11.3* 8.9 8.6 5.6  NEUTROABS 13.3*  --   --   --   --   HGB 12.7* 9.7* 10.4* 10.0* 10.0*  HCT 35.2* 27.4* 29.0* 28.5* 28.6*  MCV 101.7* 102.2* 104.7* 104.0* 103.6*  PLT 263 207 204 226 532   Basic Metabolic Panel: Recent Labs  Lab 08/09/20 1551 08/09/20 2224 08/10/20 0503 08/11/20 0709 08/12/20 0525 08/13/20 0715  NA 125* 126* 127* 130* 129* 132*  K 2.7* 3.3* 2.9* 3.6 3.5 3.2*  CL 86* 91* 92* 98 97* 99  CO2 26 25 24 22 24 24   GLUCOSE 122* 135* 107* 87 85 95  BUN 18 18 19 23 18 13   CREATININE 0.83 1.02 0.88 1.03 0.89 0.83  CALCIUM 9.7 9.2 8.8* 9.1 9.2 9.2  MG 1.5*  --   --  1.8  --   --    GFR: Estimated Creatinine Clearance: 83.3 mL/min (by C-G formula based on SCr of 0.83 mg/dL). Liver Function Tests: Recent Labs  Lab 08/09/20 1551  AST 17  ALT 13  ALKPHOS 61  BILITOT 1.1  PROT 8.4*  ALBUMIN 4.2   No results for input(s): LIPASE, AMYLASE in the last 168 hours. No results for input(s): AMMONIA in the last 168 hours. Coagulation Profile: No results for input(s): INR, PROTIME in the last 168  hours. Cardiac Enzymes: No results for input(s): CKTOTAL, CKMB, CKMBINDEX, TROPONINI in the last 168 hours. BNP (last 3 results) No results for input(s): PROBNP in the last 8760 hours. HbA1C: No results for input(s): HGBA1C in the last 72 hours. CBG: Recent Labs  Lab 08/12/20 0928  GLUCAP 121*   Lipid Profile: No results for input(s): CHOL, HDL, LDLCALC, TRIG, CHOLHDL, LDLDIRECT in the last 72 hours. Thyroid Function Tests: No results for input(s): TSH, T4TOTAL, FREET4, T3FREE, THYROIDAB in the last 72 hours. Anemia Panel: No results for input(s): VITAMINB12, FOLATE, FERRITIN, TIBC, IRON, RETICCTPCT in the last 72 hours. Sepsis Labs: Recent Labs  Lab 08/09/20 1549 08/09/20 1804  LATICACIDVEN 1.1 1.0    Recent Results (from the past 240 hour(s))  Blood culture (routine x 2)     Status: Abnormal   Collection Time: 08/09/20  3:49 PM   Specimen: BLOOD  Result Value Ref Range Status   Specimen Description   Final    BLOOD BLOOD LEFT FOREARM Performed at [facility], [address] [place], [place] [address]    Special Requests   Final    BOTTLES DRAWN AEROBIC AND ANAEROBIC Blood Culture adequate volume Performed at [facility], [address] [place], [place] [address]    Culture  Setup Time   Final    GRAM POSITIVE COCCI AEROBIC BOTTLE ONLY CRITICAL RESULT CALLED TO, READ BACK BY AND VERIFIED WITH: NATHAN BELUE ON 08/10/20 AT 9622 QSD Performed at [facility], [place] [address], [place],  [address]    Culture METHICILLIN RESISTANT STAPHYLOCOCCUS AUREUS (A)  Final   Report Status 08/12/2020 FINAL  Final   Organism ID, Bacteria METHICILLIN RESISTANT STAPHYLOCOCCUS AUREUS  Final  Susceptibility   Methicillin resistant staphylococcus aureus - MIC*    CIPROFLOXACIN >=8 RESISTANT Resistant     ERYTHROMYCIN <=0.25 SENSITIVE Sensitive     GENTAMICIN <=0.5 SENSITIVE Sensitive     OXACILLIN >=4 RESISTANT Resistant     TETRACYCLINE >=16  RESISTANT Resistant     VANCOMYCIN 1 SENSITIVE Sensitive     TRIMETH/SULFA <=10 SENSITIVE Sensitive     CLINDAMYCIN <=0.25 SENSITIVE Sensitive     RIFAMPIN <=0.5 SENSITIVE Sensitive     Inducible Clindamycin NEGATIVE Sensitive     * METHICILLIN RESISTANT STAPHYLOCOCCUS AUREUS  Blood Culture ID Panel (Reflexed)     Status: Abnormal   Collection Time: 08/09/20  3:49 PM  Result Value Ref Range Status   Enterococcus faecalis NOT DETECTED NOT DETECTED Final   Enterococcus Faecium NOT DETECTED NOT DETECTED Final   Listeria monocytogenes NOT DETECTED NOT DETECTED Final   Staphylococcus species DETECTED (A) NOT DETECTED Final    Comment: CRITICAL RESULT CALLED TO, READ BACK BY AND VERIFIED WITH: NATHAN BELUE ON 08/10/20 AT 5638 QSD    Staphylococcus aureus (BCID) DETECTED (A) NOT DETECTED Final    Comment: Methicillin (oxacillin)-resistant Staphylococcus aureus (MRSA). MRSA is predictably resistant to beta-lactam antibiotics (except ceftaroline). Preferred therapy is vancomycin unless clinically contraindicated. Patient requires contact precautions if  hospitalized. CRITICAL RESULT CALLED TO, READ BACK BY AND VERIFIED WITH: NATHAN BELUE ON 08/10/20 AT 0616 QSD    Staphylococcus epidermidis NOT DETECTED NOT DETECTED Final   Staphylococcus lugdunensis NOT DETECTED NOT DETECTED Final   Streptococcus species NOT DETECTED NOT DETECTED Final   Streptococcus agalactiae NOT DETECTED NOT DETECTED Final   Streptococcus pneumoniae NOT DETECTED NOT DETECTED Final   Streptococcus pyogenes NOT DETECTED NOT DETECTED Final   A.calcoaceticus-baumannii NOT DETECTED NOT DETECTED Final   Bacteroides fragilis NOT DETECTED NOT DETECTED Final   Enterobacterales NOT DETECTED NOT DETECTED Final   Enterobacter cloacae complex NOT DETECTED NOT DETECTED Final   Escherichia coli NOT DETECTED NOT DETECTED Final   Klebsiella aerogenes NOT DETECTED NOT DETECTED Final   Klebsiella oxytoca NOT DETECTED NOT DETECTED Final    Klebsiella pneumoniae NOT DETECTED NOT DETECTED Final   Proteus species NOT DETECTED NOT DETECTED Final   Salmonella species NOT DETECTED NOT DETECTED Final   Serratia marcescens NOT DETECTED NOT DETECTED Final   Haemophilus influenzae NOT DETECTED NOT DETECTED Final   Neisseria meningitidis NOT DETECTED NOT DETECTED Final   Pseudomonas aeruginosa NOT DETECTED NOT DETECTED Final   Stenotrophomonas maltophilia NOT DETECTED NOT DETECTED Final   Candida albicans NOT DETECTED NOT DETECTED Final   Candida auris NOT DETECTED NOT DETECTED Final   Candida glabrata NOT DETECTED NOT DETECTED Final   Candida krusei NOT DETECTED NOT DETECTED Final   Candida parapsilosis NOT DETECTED NOT DETECTED Final   Candida tropicalis NOT DETECTED NOT DETECTED Final   Cryptococcus neoformans/gattii NOT DETECTED NOT DETECTED Final   Meth resistant mecA/C and MREJ DETECTED (A) NOT DETECTED Final    Comment: CRITICAL RESULT CALLED TO, READ BACK BY AND VERIFIED WITH: NATHAN BELUE ON 08/10/20 AT 7564 QSD Performed at [facility], [address] [place], [place] [address]   Blood culture (routine x 2)     Status: Abnormal   Collection Time: 08/09/20  3:50 PM   Specimen: BLOOD  Result Value Ref Range Status   Specimen Description   Final    BLOOD RIGHT ANTECUBITAL Performed at [facility], [address] [place], [place] [address]    Special Requests  Final    BOTTLES DRAWN AEROBIC AND ANAEROBIC Blood Culture adequate volume Performed at [facility], [address] [place], [place] [address]    Culture  Setup Time   Final    GRAM POSITIVE COCCI IN BOTH AEROBIC AND ANAEROBIC BOTTLES CRITICAL VALUE NOTED.  VALUE IS CONSISTENT WITH PREVIOUSLY REPORTED AND CALLED VALUE. Performed at [facility], [address] [place], [place] [address]    Culture (A)  Final    STAPHYLOCOCCUS AUREUS SUSCEPTIBILITIES PERFORMED ON PREVIOUS CULTURE WITHIN THE LAST 5 DAYS. Performed  at [facility], [place] [address], [place], [place] [address]    Report Status 08/12/2020 FINAL  Final  Resp Panel by RT-PCR (Flu A&B, Covid) Nasopharyngeal Swab     Status: None   Collection Time: 08/09/20  4:10 PM   Specimen: Nasopharyngeal Swab; Nasopharyngeal(NP) swabs in vial transport medium  Result Value Ref Range Status   SARS Coronavirus 2 by RT PCR NEGATIVE NEGATIVE Final    Comment: (NOTE) SARS-CoV-2 target nucleic acids are NOT DETECTED.  The SARS-CoV-2 RNA is generally detectable in upper respiratory specimens during the acute phase of infection. The lowest concentration of SARS-CoV-2 viral copies this assay can detect is 138 copies/mL. A negative result does not preclude SARS-Cov-2 infection and should not be used as the sole basis for treatment or other patient management decisions. A negative result may occur with  improper specimen collection/handling, submission of specimen other than nasopharyngeal swab, presence of viral mutation(s) within the areas targeted by this assay, and inadequate number of viral copies(<138 copies/mL). A negative result must be combined with clinical observations, patient history, and epidemiological information. The expected result is Negative.  Fact Sheet for Patients:  EntrepreneurPulse.com.au  Fact Sheet for Healthcare Providers:  IncredibleEmployment.be  This test is no t yet approved or cleared by the [place] FDA and  has been authorized for detection and/or diagnosis of SARS-CoV-2 by FDA under an Emergency Use Authorization (EUA). This EUA will remain  in effect (meaning this test can be used) for the duration of the COVID-19 declaration under Section 564(b)(1) of the Act, 21 U.S.C.section 360bbb-3(b)(1), unless the authorization is terminated  or revoked sooner.       Influenza A by PCR NEGATIVE NEGATIVE Final   Influenza B by PCR NEGATIVE NEGATIVE Final    Comment: (NOTE) The  Xpert Xpress SARS-CoV-2/FLU/RSV plus assay is intended as an aid in the diagnosis of influenza from Nasopharyngeal swab specimens and should not be used as a sole basis for treatment. Nasal washings and aspirates are unacceptable for Xpert Xpress SARS-CoV-2/FLU/RSV testing.  Fact Sheet for Patients: EntrepreneurPulse.com.au  Fact Sheet for Healthcare Providers: IncredibleEmployment.be  This test is not yet approved or cleared by the [place] FDA and has been authorized for detection and/or diagnosis of SARS-CoV-2 by FDA under an Emergency Use Authorization (EUA). This EUA will remain in effect (meaning this test can be used) for the duration of the COVID-19 declaration under Section 564(b)(1) of the Act, 21 U.S.C. section 360bbb-3(b)(1), unless the authorization is terminated or revoked.  Performed at [facility], [address] [place], [place] [address]   MRSA PCR Screening     Status: Abnormal   Collection Time: 08/10/20 10:50 AM   Specimen: Nasal Mucosa; Nasopharyngeal  Result Value Ref Range Status   MRSA by PCR POSITIVE (A) NEGATIVE Final    Comment:        The GeneXpert MRSA Assay (FDA approved for NASAL specimens  only), is one component of a comprehensive MRSA colonization surveillance program. It is not intended to diagnose MRSA infection nor to guide or monitor treatment for MRSA infections. RESULT CALLED TO, READ BACK BY AND VERIFIED WITH: Jonnie Kind 08/10/20 AT 1257 BY ACR Performed at [facility], [address] [place], [place] [address]   CULTURE, BLOOD (ROUTINE X 2) w Reflex to ID Panel     Status: None (Preliminary result)   Collection Time: 08/10/20 11:55 PM   Specimen: BLOOD  Result Value Ref Range Status   Specimen Description BLOOD BLOOD LEFT HAND  Final   Special Requests   Final    BOTTLES DRAWN AEROBIC AND ANAEROBIC Blood Culture adequate volume   Culture   Final    NO GROWTH  2 DAYS Performed at [facility], [address] [place], [place] [address]    Report Status PENDING  Incomplete  CULTURE, BLOOD (ROUTINE X 2) w Reflex to ID Panel     Status: None (Preliminary result)   Collection Time: 08/10/20 11:55 PM   Specimen: BLOOD  Result Value Ref Range Status   Specimen Description BLOOD BLOOD RIGHT HAND  Final   Special Requests   Final    BOTTLES DRAWN AEROBIC AND ANAEROBIC Blood Culture adequate volume   Culture   Final    NO GROWTH 2 DAYS Performed at [facility], [address], [place], [place] [address]    Report Status PENDING  Incomplete  Aerobic/Anaerobic Culture w Gram Stain (surgical/deep wound)     Status: None (Preliminary result)   Collection Time: 08/11/20  4:40 PM   Specimen: Wound; Tissue  Result Value Ref Range Status   Specimen Description   Final    WOUND Performed at [facility], [address] [place], [place] [address]    Special Requests   Final    LEFT FOOT Performed at [facility], [address] [place], [place] [address]    Gram Stain   Final    FEW WBC PRESENT,BOTH PMN AND MONONUCLEAR GRAM POSITIVE COCCI Performed at [facility], [place] [address], [place], [place] [address]    Culture   Final    ABUNDANT STAPHYLOCOCCUS AUREUS SUSCEPTIBILITIES TO FOLLOW NO ANAEROBES ISOLATED; CULTURE IN PROGRESS FOR 5 DAYS    Report Status PENDING  Incomplete  Aerobic/Anaerobic Culture w Gram Stain (surgical/deep wound)     Status: None (Preliminary result)   Collection Time: 08/12/20  1:38 PM   Specimen: Wound  Result Value Ref Range Status   Specimen Description   Final    WOUND Performed at [facility], [address] [place], [place] [address]    Special Requests   Final    NONE Performed at [facility], [address] [place], [place] [address]    Gram Stain   Final    NO WBC SEEN RARE GRAM POSITIVE COCCI IN CLUSTERS Performed at [facility], [place] [address] [place], [place] [address]    Culture PENDING  Incomplete   Report Status PENDING  Incomplete      Imaging Studies   No results found.   Medications   Scheduled Meds: . amLODipine  2.5 mg Oral Daily  . aspirin EC  81 mg Oral Daily  . azelastine  1 spray Each Nare BID  . B-complex with vitamin C  1 tablet Oral Daily  . butamben-tetracaine-benzocaine      . calcium citrate-vitamin D  1 tablet Oral Daily  . Chlorhexidine Gluconate Cloth  6 each Topical Q0600  . enoxaparin (LOVENOX) injection  40 mg Subcutaneous Q24H  . fentaNYL      . folic acid  1 mg Oral Daily  . furosemide  40 mg Oral Daily  . gabapentin  1,200 mg Oral BID  . lidocaine      . melatonin  5 mg Oral QHS  . midazolam      . pantoprazole  40 mg Oral Daily  . pravastatin  80 mg Oral Daily  . primidone  50 mg Oral TID  . sodium chloride flush      . tretinoin  1 application Topical QHS  . triamcinolone  1 application Topical BID  . cyanocobalamin  1,000 mcg Oral Daily   Continuous Infusions: . sodium chloride Stopped (08/10/20 1753)  . ceFEPime (MAXIPIME) IV 2 g (08/12/20 2249)  . vancomycin 1,750 mg (08/13/20 1054)       LOS: 3 days    Time spent: [place], MD [facility]  08/13/2020, 1:21 PM      If 7PM-7AM, please contact night-coverage. How to contact the [facility] Attending or Consulting provider [place] or covering provider during after hours [place], for this patient?    1. Check the care team in [facility] and look for a) attending/consulting TRH provider listed and b) the [facility] team listed 2. Log into [email or web address] and use [place]'s universal password to access. If you do not have the password, please contact the hospital operator. 3. Locate the [facility] provider you are looking for under [facility] and page to a number that you can be directly reached. 4. If you still have difficulty reaching the provider, please page the [facility] (Director on Call) for  the Hospitalists listed on amion for assistance.

## 2020-08-13 NOTE — TOC Progression Note (Signed)
Transition of Care [facility]) - Progression Note    Patient Details  Name: [name] MRN: [id number] Date of Birth: 02-07-1936  [facility] CM/SW [place], RN Phone Number: 08/13/2020, 2:24 PM  Clinical Narrative:  Per Attending patient may be ready for discharge tomorrow, 08/14/20, will need [facility] PT/OT/RN for wound care. Carolynn Sayers from Infusion, will provide service for IV antibiotics. [facility] will provide the [facility] Services.         Expected Discharge Plan and Services                                                 Social Determinants of Health (SDOH) Interventions    Readmission Risk Interventions No flowsheet data found.

## 2020-08-13 NOTE — Progress Notes (Signed)
ID MD spoke with writer regarding wound vac leaking upon assesing pt, encouraged patient to not stand or move foot so that the wound vac wouldn't become dislodged. Patient was agitated and stated he didn't stand or maneuver around with vac. Paged and secured chat [place] with no success. MD aware, no new orders at this time.

## 2020-08-13 NOTE — Progress Notes (Signed)
*  PRELIMINARY RESULTS* Echocardiogram Echocardiogram Transesophageal has been performed.  [name] 08/13/2020, 8:39 AM

## 2020-08-13 NOTE — Progress Notes (Signed)
Arielle RN aware PICC is ready to use and need to remove PIV in [place].  Wound vac continues to alarm, pt voices dissatisfaction to it not being fixed, otherwise no c/o, pleasant and cooperative.

## 2020-08-13 NOTE — Progress Notes (Signed)
Date of Admission:  08/09/2020     ID: [name] is a 85 y.o. male Active Problems:   Sepsis ([place])   MRSA bacteremia    Subjective: Underwent debridement of the abscess left heel.  On 08/12/2020 [name]. No fever   Medications:  . amLODipine  2.5 mg Oral Daily  . aspirin EC  81 mg Oral Daily  . azelastine  1 spray Each Nare BID  . B-complex with vitamin C  1 tablet Oral Daily  . butamben-tetracaine-benzocaine      . calcium citrate-vitamin D  1 tablet Oral Daily  . Chlorhexidine Gluconate Cloth  6 each Topical Q0600  . enoxaparin (LOVENOX) injection  40 mg Subcutaneous Q24H  . fentaNYL      . folic acid  1 mg Oral Daily  . furosemide  40 mg Oral Daily  . gabapentin  1,200 mg Oral BID  . lidocaine      . melatonin  5 mg Oral QHS  . midazolam      . pantoprazole  40 mg Oral Daily  . potassium chloride  40 mEq Oral Q2H  . [START ON 08/14/2020] potassium chloride  40 mEq Oral Daily  . pravastatin  80 mg Oral Daily  . primidone  50 mg Oral TID  . sodium chloride flush      . tretinoin  1 application Topical QHS  . triamcinolone  1 application Topical BID  . cyanocobalamin  1,000 mcg Oral Daily    Objective: Vital signs in last 24 hours: Temp:  [97.1 F (36.2 C)-98.9 F (37.2 C)] 98.3 F (36.8 C) (03/03 1131) Pulse Rate:  [56-93] 79 (03/03 1131) Resp:  [13-20] 20 (03/03 1131) BP: (98-145)/(54-83) 119/74 (03/03 1131) SpO2:  [93 %-100 %] 100 % (03/03 1131) Weight:  [98 kg] 98 kg (03/03 0751)  PHYSICAL EXAM:  General: Alert, cooperative, no distress,  Lungs: Clear to auscultation bilaterally. No Wheezing or Rhonchi. No rales. Heart: Regular rate and rhythm, no murmur, rub or gallop. Abdomen: Soft, non-tender,not distended. Bowel sounds normal. No masses Extremities: Left leg swollen Erythema less than before Has a wound VAC Skin: No rashes or lesions. Or bruising Lymph: Cervical, supraclavicular normal. Neurologic: Grossly  non-focal  Lab Results Recent Labs    08/12/20 0525 08/13/20 0715  WBC 8.6 5.6  HGB 10.0* 10.0*  HCT 28.5* 28.6*  NA 129* 132*  K 3.5 3.2*  CL 97* 99  CO2 24 24  BUN 18 13  CREATININE 0.89 0.83   Liver Panel No results for input(s): PROT, ALBUMIN, AST, ALT, ALKPHOS, BILITOT, BILIDIR, IBILI in the last 72 hours. Sedimentation Rate Recent Labs    08/11/20 0709  ESRSEDRATE 106*   C-Reactive Protein No results for input(s): CRP in the last 72 hours.  Microbiology: Blood culture 08/09/2020: MRSA in both sets of blood culture. 08/10/2020 blood culture no growth 08/11/2020 wound culture  Studies/Results: [place] EKG SITE RITE  Result Date: 08/13/2020 If Site Rite image not attached, placement could not be confirmed due to current cardiac rhythm.    Assessment/Plan:  MRSA bacteremia Repeat blood culture negative so far TEE negative today  Left foot cellulitis with abscess of the left heel underwent debridement of the abscess yesterday.  Currently on vancomycin.  Slow progress.  We will add linezolid. We will check to see whether he is eligible for daptomycin as outpatient as he will need antibiotics for another 2 to 3 weeks.  As more  renal friendly.  Peripheral neuropathy  CAD status post CABG  On Pravachol and aspirin  Discussed the management with the patient, And the care team.

## 2020-08-13 NOTE — Progress Notes (Signed)
Patient wife at bedside while patient was away at surgery and wife informed that medicines that she left in containers couldn't be used at the hospital unless verified with pharmacy and MD. Also informed wife that staff is providing appropriate measures with medicines here already and that they weren't needed and she could take them back home. Wife refused to take meds back home and meds placed in plastic belongings bag by Probation officer. Patient aware once back on floor.

## 2020-08-14 ENCOUNTER — Encounter: Payer: Self-pay | Admitting: Podiatry

## 2020-08-14 DIAGNOSIS — L02612 Cutaneous abscess of left foot: Secondary | ICD-10-CM | POA: Diagnosis not present

## 2020-08-14 DIAGNOSIS — L089 Local infection of the skin and subcutaneous tissue, unspecified: Secondary | ICD-10-CM | POA: Diagnosis not present

## 2020-08-14 DIAGNOSIS — B9562 Methicillin resistant Staphylococcus aureus infection as the cause of diseases classified elsewhere: Secondary | ICD-10-CM | POA: Diagnosis not present

## 2020-08-14 DIAGNOSIS — R7881 Bacteremia: Secondary | ICD-10-CM | POA: Diagnosis not present

## 2020-08-14 LAB — BASIC METABOLIC PANEL
Anion gap: 8 (ref 5–15)
BUN: 15 mg/dL (ref 8–23)
CO2: 25 mmol/L (ref 22–32)
Calcium: 9 mg/dL (ref 8.9–10.3)
Chloride: 100 mmol/L (ref 98–111)
Creatinine, Ser: 0.81 mg/dL (ref 0.61–1.24)
GFR, Estimated: >60 mL/min (ref >60)
Glucose, Bld: 96 mg/dL (ref 70–99)
Potassium: 3.7 mmol/L (ref 3.5–5.1)
Sodium: 133 mmol/L — ABNORMAL LOW (ref 135–145)

## 2020-08-14 LAB — CBC
HCT: 28.1 % — ABNORMAL LOW (ref 39.0–52.0)
Hemoglobin: 9.9 g/dL — ABNORMAL LOW (ref 13.0–17.0)
MCH: 36.5 pg — ABNORMAL HIGH (ref 26.0–34.0)
MCHC: 35.2 g/dL (ref 30.0–36.0)
MCV: 103.7 fL — ABNORMAL HIGH (ref 80.0–100.0)
Platelets: 270 10*3/uL (ref 150–400)
RBC: 2.71 MIL/uL — ABNORMAL LOW (ref 4.22–5.81)
RDW: 12.2 % (ref 11.5–15.5)
WBC: 5.4 10*3/uL (ref 4.0–10.5)
nRBC: 0 % (ref 0.0–0.2)

## 2020-08-14 LAB — MAGNESIUM: Magnesium: 1.7 mg/dL (ref 1.7–2.4)

## 2020-08-14 LAB — CK: Total CK: 23 U/L — ABNORMAL LOW (ref 49–397)

## 2020-08-14 MED ORDER — LINEZOLID 600 MG PO TABS: 600.0000 mg | ORAL_TABLET | Freq: Two times a day (BID) | ORAL | 0 refills | Status: DC

## 2020-08-14 MED ORDER — SODIUM CHLORIDE 0.9 % IV SOLN
750.0000 mg | Freq: Every day | INTRAVENOUS | Status: DC
  Administered 2020-08-14 – 2020-08-17 (×4): 750 mg via INTRAVENOUS
  Filled 2020-08-14 (×6): qty 15

## 2020-08-14 MED ORDER — DAPTOMYCIN IV (FOR PTA / DISCHARGE USE ONLY): 750.0000 mg | INTRAVENOUS | 0 refills | Status: AC

## 2020-08-14 MED ORDER — OXYCODONE HCL 5 MG PO TABS: 5.0000 mg | ORAL_TABLET | Freq: Four times a day (QID) | ORAL | Status: DC | PRN

## 2020-08-14 NOTE — Discharge Summary (Deleted)
[name] JHE:[id number] DOB: [birth year] DOA: 08/09/2020  PCP: [name], DO  Admit date: 08/09/2020 Discharge date: 08/14/2020  Time spent: 45 minutes  Recommendations for Outpatient Follow-up:  1. outpt f/u with podiatry and infectious disease  2. Check of kidney function and potassium 1 week    Discharge Diagnoses:  Active Problems:   Sepsis ([place])   MRSA bacteremia   Discharge Condition: stable  Diet recommendation: heart healthy  Filed Weights   08/13/20 0504 08/13/20 0751 08/14/20 0550  Weight: 98 kg 98 kg 94.5 kg    History of present illness:  [name] is a 85 y.o. male with medical history significant for idiopathic peripheral neuropathy, hypertension, hyperlipidemia, GERD, presented to the emergency department for chief concerns of left leg pain.  He describes the pain as throbbing, 8/10, worse with weight bearing, started Thursday, 08/06/2020.  He denies trauma to the area.  He denies fever at home, nausea, vomiting, chest pain, shortness of breath, abdominal pain, dysuria, hematuria.  Social history: He lives with his wife.  He denies tobacco use.  He endorses etoh, 1 drink consisting of half a glass of wine then he adds seltzer water, 3x per week.  He denies recreational drug use.  He is retired, and formerly was a [employer].  Hospital Course:  Sepsis secondary to MRSA bacteremia and Left Foot Cellulitis with abscess . S/p I and D by podiatry on 3/2. TTE/TEE w/o signs vegetations. PICC placed - continue wound vac, will need MWF changes per wound nurse. Home health rn ordered --will f/u with podiatry - will discharge on daptomycin, will f/u with ID in one week - home pt/ot, declined snf  Hypokalemia - suspect secondary to diuretics - resume home potassium  Hyponatremia - POA, chronic. May be 2/2 thiazide. Improving off thiazide - monitor  Chronic systolic CHF / CAD / MR / TR - all appear stable.  Monitor volume status, renal  function, electrolytes --Continue ASA --cont home lasix -resume home losartan at outpt f/u if kidney function and electrolytes allow  Hypotension POA / Hx of Hypertension -resumed amlodipine 2.5 mg, lasix -Held: home HCTZ, losartan   Procedures:  I & D 3/2, TEE  Consultations:  ID, podiatry  Discharge Exam: Vitals:   08/14/20 0749 08/14/20 1217  BP: 128/81 129/71  Pulse: 64 63  Resp: 18 18  Temp: 97.9 F (36.6 C) 97.9 F (36.6 C)  SpO2: 97% 95%    General exam: awake, alert, no acute distress Respiratory system: symmetric chest rise, normal respiratory effort, on room air. Cardiovascular system: RRR, no RLE edema, LLE edema due to cellulitis.   Central nervous system: A&O x3. no gross focal neurologic deficits, normal speech Extremities: left foot wound vac applied, erythematous and edematous to mid calf, unchanged Psychiatry: normal mood, congruent affect, judgement and insight appear normal Discharge Instructions   Discharge Instructions    Diet - low sodium heart healthy   Complete by: As directed    Discharge wound care:   Complete by: As directed    Per home health rn   Face-to-face encounter (required for Medicare/Medicaid patients)   Complete by: As directed    I Desma Maxim certify that this patient is under my care and that I, or a nurse practitioner or physician's assistant working with me, had a face-to-face encounter that meets the physician face-to-face encounter requirements with this patient on 08/14/2020. The encounter with the patient was in whole, or in part for the following medical condition(s)  which is the primary reason for home health care (List medical condition): left foot infection with bacteremia, non-weight-bearing status left leg   The encounter with the patient was in whole, or in part, for the following medical condition, which is the primary reason for home health care: foot infection   I certify that, based on my findings, the following  services are medically necessary home health services:  Nursing Physical therapy     Reason for Medically Necessary Home Health Services: Skilled Nursing- Complex Wound Care   My clinical findings support the need for the above services: Can transfer bed to chair only   Further, I certify that my clinical findings support that this patient is homebound due to: Can transfer bed to chair only   Home Health   Complete by: As directed    To provide the following care/treatments:  PT OT RN Home Health Aide     Increase activity slowly   Complete by: As directed    PR HEAVYDUTY/WIDE COMMODE CHAIR   Complete by: As directed    Drop arm     Allergies as of 08/14/2020      Reactions   Codeine Nausea And Vomiting   Indocin [indomethacin] Other (See Comments)   Latex Other (See Comments)   Blisters   Mupirocin    Blisters   Plavix [clopidogrel Bisulfate]    GI intolerance, n/v   Polysporin [bacitracin-polymyxin B]    Blisters   Shellfish Allergy    Tape    Neosporin [neomycin-bacitracin Zn-polymyx] Rash   Other Nausea And Vomiting   mussels      Medication List    STOP taking these medications   hydrochlorothiazide 25 MG tablet Commonly known as: HYDRODIURIL   losartan 50 MG tablet Commonly known as: COZAAR     TAKE these medications   Alpha Lipoic Acid 200 MG Caps Take 2 capsules by mouth daily.   amLODipine 2.5 MG tablet Commonly known as: NORVASC TAKE 1 TABLET BY MOUTH  DAILY   Apoaequorin 10 MG Caps Take 1 capsule by mouth as directed.   aspirin 81 MG tablet Take 162 mg by mouth at bedtime.   azelastine 0.1 % nasal spray Commonly known as: ASTELIN Place 1 spray into both nostrils 2 (two) times daily. Use in each nostril as directed   Biotin 1000 MCG tablet Take 1,000 mcg by mouth daily.   Calcium Carb-Cholecalciferol 500-100 MG-UNIT Chew Chew 500 mg by mouth daily.   cyanocobalamin 1000 MCG tablet Take 1,000 mcg by mouth daily.   diphenhydrAMINE 25  MG tablet Commonly known as: BENADRYL Take 25 mg by mouth at bedtime as needed for allergies.   folic acid 1 MG tablet Commonly known as: FOLVITE Take 1 mg by mouth daily.   furosemide 40 MG tablet Commonly known as: LASIX Take 1 tablet (40 mg total) by mouth daily.   gabapentin 600 MG tablet Commonly known as: NEURONTIN TAKE 2 TABLETS BY MOUTH  TWICE DAILY   Glucosamine Sulfate 1000 MG Caps Take 1 capsule by mouth daily.   Lecithin 400 MG Caps Take 400 mg by mouth daily.   melatonin 5 MG Tabs Take 5 mg by mouth at bedtime.   Omega 3 1000 MG Caps Take 1,000 mg by mouth daily.   omeprazole 20 MG capsule Commonly known as: PRILOSEC TAKE 1 CAPSULE BY MOUTH  TWICE DAILY BEFORE MEALS What changed: See the new instructions.   potassium chloride SA 20 MEQ tablet Commonly known as: KLOR-CON  Take 1 tablet (20 mEq total) by mouth 2 (two) times daily.   pravastatin 80 MG tablet Commonly known as: PRAVACHOL TAKE 1 TABLET BY MOUTH  DAILY What changed: when to take this   primidone 50 MG tablet Commonly known as: MYSOLINE TAKE 1 TABLET BY MOUTH 3  TIMES DAILY What changed:   how much to take  when to take this  additional instructions   Selenium 200 MCG Caps Take 200 mcg by mouth daily.   tretinoin 0.05 % cream Commonly known as: RETIN-A Apply 1 application topically at bedtime.   triamcinolone 0.1 % Commonly known as: KENALOG Apply 1 application topically 2 (two) times daily. (apply to hands, legs and feet)   VITAMIN-B COMPLEX PO Take 1 tablet by mouth daily.            Durable Medical Equipment  (From admission, onward)         Start     Ordered   08/14/20 1135  DME 3-in-1  Once        08/14/20 1138   08/14/20 1135  DME standard manual wheelchair with seat cushion  Once       Comments: Patient suffers from lower extremity infection status post incision and drainage which impairs their ability to perform daily activities like bathing, dressing,  feeding, grooming, and toileting in the home.  A walker will not resolve issue with performing activities of daily living. A wheelchair will allow patient to safely perform daily activities. Patient can safely propel the wheelchair in the home or has a caregiver who can provide assistance. Length of need 6 months . Accessories: elevating leg rests (ELRs), wheel locks, extensions and anti-tippers.   08/14/20 1138           Discharge Care Instructions  (From admission, onward)         Start     Ordered   08/14/20 0000  Discharge wound care:       Comments: Per home health rn   08/14/20 1138         Allergies  Allergen Reactions  . Codeine Nausea And Vomiting  . Indocin [Indomethacin] Other (See Comments)  . Latex Other (See Comments)    Blisters  . Mupirocin     Blisters  . Plavix [Clopidogrel Bisulfate]     GI intolerance, n/v  . Polysporin [Bacitracin-Polymyxin B]     Blisters  . Shellfish Allergy   . Tape   . Neosporin [Neomycin-Bacitracin Zn-Polymyx] Rash  . Other Nausea And Vomiting    mussels    Follow-up Information    Schedule an appointment as soon as possible for a visit with Samara Deist, DPM.   Specialty: Podiatry Why: Patient will need to make a follow up appointment. Contact information: [place] [address] [phone]        Tsosie Billing, MD.   Specialty: Infectious Diseases Why: call to schedule an appointment in 1 week..Patient will need to make a follow up appointment. Contact information: [place] [address] [phone]                The results of significant diagnostics from this hospitalization (including imaging, microbiology, ancillary and laboratory) are listed below for reference.    Significant Diagnostic Studies: MR ANKLE LEFT W WO CONTRAST  Result Date: 08/09/2020 CLINICAL DATA:  Left leg swelling and redness, question of  osteomyelitis EXAM: MRI OF THE LEFT ANKLE WITHOUT AND  WITH CONTRAST TECHNIQUE: Multiplanar, multisequence MR imaging of the ankle was performed before and after the administration of intravenous contrast. CONTRAST:  53mL GADAVIST GADOBUTROL 1 MMOL/ML IV SOLN COMPARISON:  Radiograph same day FINDINGS: Bones/Joint/Cartilage Osteoarthritis is seen at the first medial cuneiform metatarsal joint with joint space loss and subchondral cystic changes. Minimally increased signal seen within the lateral malleolus. No areas of cortical destruction or periosteal reaction. No areas of abnormal enhancement are seen throughout the osseous structures. Tibiotalar joint osteoarthritis is seen with joint space loss and diffuse chondral loss with subchondral marrow signal changes seen anteriorly. A small ankle joint effusion is seen. Ligaments Attenuation with increased signal seen within the anterior talofibular ligament. There is also slight thickening of the spring ligament, however it is intact. The remainder of the ligaments appear to be intact. Muscles and Tendons Mildly increased signal seen within the multiple on the plantar surface. No focal atrophy or tear however is noted. A small amount of fluid is seen surrounding the posterior tibialis and peroneal tendons, however they are intact. There is minimally increased signal seen at the insertion site of the Achilles tendon with mild adjacent reactive marrow. Soft tissue There is diffuse subcutaneous edema and skin thickening seen surrounding the ankle and dorsum of the hindfoot. No loculated fluid collections are noted. No areas of abnormal enhancement are seen. IMPRESSION: IMPRESSION 1. Findings suggestive of diffuse cellulitis surrounding the ankle and hindfoot. No evidence of soft tissue abscess or osteomyelitis 2. Intrasubstance sprain and partial tear of the anterior talofibular ligament, however there are intact fibers throughout 3. Small ankle joint effusion 4. 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 Mild insertional Achilles tendinosis Electronically Signed   By: Prudencio Pair M.D.   On: 08/09/2020 22:03   US Venous Img Lower Unilateral Left  Result Date: 08/09/2020 CLINICAL DATA:  Left lower extremity edema and pain. EXAM: LEFT LOWER EXTREMITY VENOUS DOPPLER ULTRASOUND TECHNIQUE: Gray-scale sonography with compression, as well as color and duplex ultrasound, were performed to evaluate the deep venous system(s) from the level of the common femoral vein through the popliteal and proximal calf veins. COMPARISON:  None. FINDINGS: VENOUS Normal compressibility of the common femoral, superficial femoral, and popliteal veins, as well as the visualized calf veins. Visualized portions of profunda femoral vein and great saphenous vein unremarkable. No filling defects to suggest DVT on grayscale or color Doppler imaging. Doppler waveforms show normal direction of venous flow, normal respiratory plasticity and response to augmentation. Limited views of the contralateral common femoral vein are unremarkable. OTHER None. Limitations: none IMPRESSION: Negative. Electronically Signed   By: Fidela Salisbury M.D.   On: 08/09/2020 16:47   DG Foot 2 Views Left  Result Date: 08/09/2020 CLINICAL DATA:  left leg redness EXAM: LEFT FOOT - 2 VIEW COMPARISON:  MRI left foot 02/17/2016 FINDINGS: First and second digit distal phalangeal amputation. No cortical erosion or destruction. There is no evidence of fracture or dislocation. There is no evidence of severe arthropathy or other focal bone abnormality. Subcutaneus soft tissue edema. Vascular calcifications. IMPRESSION: 1. 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 2. No acute displaced fracture or dislocation of the bones of the left foot. Electronically Signed   By: Iven Finn M.D.   On: 08/09/2020 18:30   ECHOCARDIOGRAM  COMPLETE  Result Date: 08/12/2020    ECHOCARDIOGRAM REPORT   Patient Name:   [name]  Date of Exam: 08/10/2020 Medical  Rec #:  [id number]  Height:       72.0 in Accession #:    [id number] Weight:       210.8 lb Date of Birth:  [birth year]  BSA:          2.179 m Patient Age:    13 years   BP:           114/66 mmHg Patient Gender: M          HR:           80 bpm. Exam Location:  [facility] Procedure: 2D Echo, Color Doppler and Cardiac Doppler Indications:     R78.81 Bacteremia  History:         Patient has prior history of Echocardiogram examinations, most                  recent 04/23/2020. Prior CABG; Risk Factors:Hypertension and                  HCL.  Sonographer:     Charmayne Sheer RDCS (AE) Referring Phys:  [id number] Tsosie Billing Diagnosing Phys: Serafina Royals MD  Sonographer Comments: Technically difficult study due to poor echo windows. IMPRESSIONS  1. Left ventricular ejection fraction, by estimation, is 55 to 60%. The left ventricle has normal function. The left ventricle has no regional wall motion abnormalities. Left ventricular diastolic parameters were normal.  2. Right ventricular systolic function is normal. The right ventricular size is mildly enlarged.  3. Left atrial size was mildly dilated.  4. Right atrial size was mildly dilated.  5. The mitral valve is normal in structure. Mild mitral valve regurgitation.  6. The aortic valve is normal in structure. Aortic valve regurgitation is not visualized. FINDINGS  Left Ventricle: Left ventricular ejection fraction, by estimation, is 55 to 60%. The left ventricle has normal function. The left ventricle has no regional wall motion abnormalities. The left ventricular internal cavity size was normal in size. There is  no left ventricular hypertrophy. Left ventricular diastolic parameters were normal. Right Ventricle: The right ventricular size is mildly enlarged. No increase in right ventricular wall thickness. Right ventricular systolic function is normal.  Left Atrium: Left atrial size was mildly dilated. Right Atrium: Right atrial size was mildly dilated. Pericardium: There is no evidence of pericardial effusion. Mitral Valve: The mitral valve is normal in structure. Mild mitral valve regurgitation. MV peak gradient, 3.5 mmHg. The mean mitral valve gradient is 2.0 mmHg. Tricuspid Valve: The tricuspid valve is normal in structure. Tricuspid valve regurgitation is mild. Aortic Valve: The aortic valve is normal in structure. Aortic valve regurgitation is not visualized. Aortic valve mean gradient measures 4.0 mmHg. Aortic valve peak gradient measures 8.0 mmHg. Aortic valve area, by VTI measures 3.53 cm. Pulmonic Valve: The pulmonic valve was normal in structure. Pulmonic valve regurgitation is not visualized. Aorta: The aortic root and ascending aorta are structurally normal, with no evidence of dilitation. IAS/Shunts: No atrial level shunt detected by color flow Doppler.  LEFT VENTRICLE PLAX 2D LVIDd:         4.80 cm     Diastology LVIDs:         3.80 cm     LV e' medial:    9.79 cm/s LV PW:         1.10 cm     LV E/e' medial:  6.6 LV IVS:        1.00 cm     LV e' lateral:   12.00  cm/s LVOT diam:     2.40 cm     LV E/e' lateral: 5.4 LV SV:         90 LV SV Index:   41 LVOT Area:     4.52 cm  LV Volumes (MOD) LV vol d, MOD A4C: 68.1 ml LV vol s, MOD A4C: 27.8 ml LV SV MOD A4C:     68.1 ml RIGHT VENTRICLE RV Basal diam:  3.40 cm LEFT ATRIUM             Index       RIGHT ATRIUM           Index LA diam:        3.70 cm 1.70 cm/m  RA Area:     25.90 cm LA Vol (A2C):   40.3 ml 18.50 ml/m RA Volume:   90.00 ml  41.31 ml/m LA Vol (A4C):   26.1 ml 11.98 ml/m LA Biplane Vol: 33.1 ml 15.19 ml/m  AORTIC VALVE                   PULMONIC VALVE AV Area (Vmax):    3.59 cm    PV Vmax:       1.42 m/s AV Area (Vmean):   3.64 cm    PV Vmean:      95.300 cm/s AV Area (VTI):     3.53 cm    PV VTI:        0.242 m AV Vmax:           141.00 cm/s PV Peak grad:  8.1 mmHg AV Vmean:           96.200 cm/s PV Mean grad:  4.0 mmHg AV VTI:            0.255 m AV Peak Grad:      8.0 mmHg AV Mean Grad:      4.0 mmHg LVOT Vmax:         112.00 cm/s LVOT Vmean:        77.300 cm/s LVOT VTI:          0.199 m LVOT/AV VTI ratio: 0.78  AORTA Ao Root diam: 3.70 cm MITRAL VALVE MV Area (PHT): 3.87 cm    SHUNTS MV Area VTI:   3.56 cm    Systemic VTI:  0.20 m MV Peak grad:  3.5 mmHg    Systemic Diam: 2.40 cm MV Mean grad:  2.0 mmHg MV Vmax:       0.94 m/s MV Vmean:      60.2 cm/s MV Decel Time: 196 msec MV E velocity: 64.90 cm/s MV A velocity: 81.90 cm/s MV E/A ratio:  0.79 Serafina Royals MD Electronically signed by Serafina Royals MD Signature Date/Time: 08/12/2020/1:15:43 PM    Final    ECHO TEE  Result Date: 08/13/2020    TRANSESOPHOGEAL ECHO REPORT   Patient Name:   [name]  Date of Exam: 08/13/2020 Medical Rec #:  [id number]  Height:       72.0 in Accession #:    [id number] Weight:       216.0 lb Date of Birth:  1936-06-03  BSA:          2.202 m Patient Age:    89 years   BP:           110/59 mmHg Patient Gender: M          HR:           89 bpm.  Exam Location:  [facility] Procedure: Transesophageal Echo, Color Doppler, Cardiac Doppler and Saline            Contrast Bubble Study Indications:     Not listed on front of TEE check-in sheet  History:         Patient has prior history of Echocardiogram examinations, most                  recent 08/10/2020. Previous Myocardial Infarction; Risk                  Factors:Hypertension.  Sonographer:     Sherrie Sport RDCS (AE) Referring Phys:  [place] Diagnosing Phys: Serafina Royals MD PROCEDURE: The transesophogeal probe was passed without difficulty through the esophogus of the patient. Sedation performed by performing physician. The patient developed no complications during the procedure. IMPRESSIONS  1. Left ventricular ejection fraction, by estimation, is 55 to 60%. The left ventricle has normal function. The left ventricle has no regional wall motion  abnormalities.  2. Right ventricular systolic function is normal. The right ventricular size is normal.  3. Left atrial size was mildly dilated. No left atrial/left atrial appendage thrombus was detected.  4. The mitral valve is normal in structure. Mild mitral valve regurgitation.  5. The aortic valve is normal in structure. Aortic valve regurgitation is trivial.  6. There is Moderate (Grade III) layered plaque involving the transverse aorta and descending aorta.  7. Agitated saline contrast bubble study was negative, with no evidence of any interatrial shunt. FINDINGS  Left Ventricle: Left ventricular ejection fraction, by estimation, is 55 to 60%. The left ventricle has normal function. The left ventricle has no regional wall motion abnormalities. The left ventricular internal cavity size was small. Right Ventricle: The right ventricular size is normal. No increase in right ventricular wall thickness. Right ventricular systolic function is normal. Left Atrium: Left atrial size was mildly dilated. No left atrial/left atrial appendage thrombus was detected. Right Atrium: Right atrial size was normal in size. Pericardium: There is no evidence of pericardial effusion. Mitral Valve: Calcified mitral leaflet tips and cords. The mitral valve is normal in structure. Mild mitral valve regurgitation. There is no evidence of mitral valve vegetation. Tricuspid Valve: The tricuspid valve is normal in structure. Tricuspid valve regurgitation is mild. There is no evidence of tricuspid valve vegetation. Aortic Valve: The aortic valve is normal in structure. Aortic valve regurgitation is trivial. There is no evidence of aortic valve vegetation. Pulmonic Valve: The pulmonic valve was normal in structure. Pulmonic valve regurgitation is trivial. Aorta: The aortic root and ascending aorta are structurally normal, with no evidence of dilitation. There is moderate (Grade III) layered plaque involving the transverse aorta and  descending aorta. IAS/Shunts: No atrial level shunt detected by color flow Doppler. Agitated saline contrast was given intravenously to evaluate for intracardiac shunting. Agitated saline contrast bubble study was negative, with no evidence of any interatrial shunt. There  is no evidence of a patent foramen ovale. There is no evidence of an atrial septal defect. Serafina Royals MD Electronically signed by Serafina Royals MD Signature Date/Time: 08/13/2020/5:23:59 PM    Final    [place] EKG SITE RITE  Result Date: 08/13/2020 If Site Rite image not attached, placement could not be confirmed due to current cardiac rhythm.   Microbiology: Recent Results (from the past 240 hour(s))  Blood culture (routine x 2)     Status: Abnormal   Collection Time: 08/09/20  3:49 PM  Specimen: BLOOD  Result Value Ref Range Status   Specimen Description   Final    BLOOD BLOOD LEFT FOREARM Performed at [facility], [address], [place], [place] [address]    Special Requests   Final    BOTTLES DRAWN AEROBIC AND ANAEROBIC Blood Culture adequate volume Performed at [facility], [address] [place], [place] [address]    Culture  Setup Time   Final    GRAM POSITIVE COCCI AEROBIC BOTTLE ONLY CRITICAL RESULT CALLED TO, READ BACK BY AND VERIFIED WITH: NATHAN BELUE ON 08/10/20 AT 6203 QSD Performed at [facility], [place] [address], [place], [place] [address]    Culture METHICILLIN RESISTANT STAPHYLOCOCCUS AUREUS (A)  Final   Report Status 08/12/2020 FINAL  Final   Organism ID, Bacteria METHICILLIN RESISTANT STAPHYLOCOCCUS AUREUS  Final      Susceptibility   Methicillin resistant staphylococcus aureus - MIC*    CIPROFLOXACIN >=8 RESISTANT Resistant     ERYTHROMYCIN <=0.25 SENSITIVE Sensitive     GENTAMICIN <=0.5 SENSITIVE Sensitive     OXACILLIN >=4 RESISTANT Resistant     TETRACYCLINE >=16 RESISTANT Resistant     VANCOMYCIN 1 SENSITIVE Sensitive     TRIMETH/SULFA <=10 SENSITIVE  Sensitive     CLINDAMYCIN <=0.25 SENSITIVE Sensitive     RIFAMPIN <=0.5 SENSITIVE Sensitive     Inducible Clindamycin NEGATIVE Sensitive     * METHICILLIN RESISTANT STAPHYLOCOCCUS AUREUS  Blood Culture ID Panel (Reflexed)     Status: Abnormal   Collection Time: 08/09/20  3:49 PM  Result Value Ref Range Status   Enterococcus faecalis NOT DETECTED NOT DETECTED Final   Enterococcus Faecium NOT DETECTED NOT DETECTED Final   Listeria monocytogenes NOT DETECTED NOT DETECTED Final   Staphylococcus species DETECTED (A) NOT DETECTED Final    Comment: CRITICAL RESULT CALLED TO, READ BACK BY AND VERIFIED WITH: NATHAN BELUE ON 08/10/20 AT 1638 QSD    Staphylococcus aureus (BCID) DETECTED (A) NOT DETECTED Final    Comment: Methicillin (oxacillin)-resistant Staphylococcus aureus (MRSA). MRSA is predictably resistant to beta-lactam antibiotics (except ceftaroline). Preferred therapy is vancomycin unless clinically contraindicated. Patient requires contact precautions if  hospitalized. CRITICAL RESULT CALLED TO, READ BACK BY AND VERIFIED WITH: NATHAN BELUE ON 08/10/20 AT 0616 QSD    Staphylococcus epidermidis NOT DETECTED NOT DETECTED Final   Staphylococcus lugdunensis NOT DETECTED NOT DETECTED Final   Streptococcus species NOT DETECTED NOT DETECTED Final   Streptococcus agalactiae NOT DETECTED NOT DETECTED Final   Streptococcus pneumoniae NOT DETECTED NOT DETECTED Final   Streptococcus pyogenes NOT DETECTED NOT DETECTED Final   A.calcoaceticus-baumannii NOT DETECTED NOT DETECTED Final   Bacteroides fragilis NOT DETECTED NOT DETECTED Final   Enterobacterales NOT DETECTED NOT DETECTED Final   Enterobacter cloacae complex NOT DETECTED NOT DETECTED Final   Escherichia coli NOT DETECTED NOT DETECTED Final   Klebsiella aerogenes NOT DETECTED NOT DETECTED Final   Klebsiella oxytoca NOT DETECTED NOT DETECTED Final   Klebsiella pneumoniae NOT DETECTED NOT DETECTED Final   Proteus species NOT DETECTED NOT  DETECTED Final   Salmonella species NOT DETECTED NOT DETECTED Final   Serratia marcescens NOT DETECTED NOT DETECTED Final   Haemophilus influenzae NOT DETECTED NOT DETECTED Final   Neisseria meningitidis NOT DETECTED NOT DETECTED Final   Pseudomonas aeruginosa NOT DETECTED NOT DETECTED Final   Stenotrophomonas maltophilia NOT DETECTED NOT DETECTED Final   Candida albicans NOT DETECTED NOT DETECTED Final   Candida auris NOT DETECTED NOT DETECTED Final  Candida glabrata NOT DETECTED NOT DETECTED Final   Candida krusei NOT DETECTED NOT DETECTED Final   Candida parapsilosis NOT DETECTED NOT DETECTED Final   Candida tropicalis NOT DETECTED NOT DETECTED Final   Cryptococcus neoformans/gattii NOT DETECTED NOT DETECTED Final   Meth resistant mecA/C and MREJ DETECTED (A) NOT DETECTED Final    Comment: CRITICAL RESULT CALLED TO, READ BACK BY AND VERIFIED WITH: NATHAN BELUE ON 08/10/20 AT 9833 QSD Performed at [facility], [address] [place], [place] [address]   Blood culture (routine x 2)     Status: Abnormal   Collection Time: 08/09/20  3:50 PM   Specimen: BLOOD  Result Value Ref Range Status   Specimen Description   Final    BLOOD RIGHT ANTECUBITAL Performed at [facility], [address] [place], [place] [address]    Special Requests   Final    BOTTLES DRAWN AEROBIC AND ANAEROBIC Blood Culture adequate volume Performed at [facility], [address] [place], [place] [address]    Culture  Setup Time   Final    GRAM POSITIVE COCCI IN BOTH AEROBIC AND ANAEROBIC BOTTLES CRITICAL VALUE NOTED.  VALUE IS CONSISTENT WITH PREVIOUSLY REPORTED AND CALLED VALUE. Performed at [facility], [address] [place], [place] [address]    Culture (A)  Final    STAPHYLOCOCCUS AUREUS SUSCEPTIBILITIES PERFORMED ON PREVIOUS CULTURE WITHIN THE LAST 5 DAYS. Performed at [facility], [place] [address], [place], [place] [address]    Report Status  08/12/2020 FINAL  Final  Resp Panel by RT-PCR (Flu A&B, Covid) Nasopharyngeal Swab     Status: None   Collection Time: 08/09/20  4:10 PM   Specimen: Nasopharyngeal Swab; Nasopharyngeal(NP) swabs in vial transport medium  Result Value Ref Range Status   SARS Coronavirus 2 by RT PCR NEGATIVE NEGATIVE Final    Comment: (NOTE) SARS-CoV-2 target nucleic acids are NOT DETECTED.  The SARS-CoV-2 RNA is generally detectable in upper respiratory specimens during the acute phase of infection. The lowest concentration of SARS-CoV-2 viral copies this assay can detect is 138 copies/mL. A negative result does not preclude SARS-Cov-2 infection and should not be used as the sole basis for treatment or other patient management decisions. A negative result may occur with  improper specimen collection/handling, submission of specimen other than nasopharyngeal swab, presence of viral mutation(s) within the areas targeted by this assay, and inadequate number of viral copies(<138 copies/mL). A negative result must be combined with clinical observations, patient history, and epidemiological information. The expected result is Negative.  Fact Sheet for Patients:  EntrepreneurPulse.com.au  Fact Sheet for Healthcare Providers:  IncredibleEmployment.be  This test is no t yet approved or cleared by the [place] FDA and  has been authorized for detection and/or diagnosis of SARS-CoV-2 by FDA under an Emergency Use Authorization (EUA). This EUA will remain  in effect (meaning this test can be used) for the duration of the COVID-19 declaration under Section 564(b)(1) of the Act, 21 U.S.C.section 360bbb-3(b)(1), unless the authorization is terminated  or revoked sooner.       Influenza A by PCR NEGATIVE NEGATIVE Final   Influenza B by PCR NEGATIVE NEGATIVE Final    Comment: (NOTE) The Xpert Xpress SARS-CoV-2/FLU/RSV plus assay is intended as an aid in the diagnosis of  influenza from Nasopharyngeal swab specimens and should not be used as a sole basis for treatment. Nasal washings and aspirates are unacceptable for Xpert Xpress SARS-CoV-2/FLU/RSV testing.  Fact  Sheet for Patients: EntrepreneurPulse.com.au  Fact Sheet for Healthcare Providers: IncredibleEmployment.be  This test is not yet approved or cleared by the [place] FDA and has been authorized for detection and/or diagnosis of SARS-CoV-2 by FDA under an Emergency Use Authorization (EUA). This EUA will remain in effect (meaning this test can be used) for the duration of the COVID-19 declaration under Section 564(b)(1) of the Act, 21 U.S.C. section 360bbb-3(b)(1), unless the authorization is terminated or revoked.  Performed at [facility], [address] [place], [place] [address]   MRSA PCR Screening     Status: Abnormal   Collection Time: 08/10/20 10:50 AM   Specimen: Nasal Mucosa; Nasopharyngeal  Result Value Ref Range Status   MRSA by PCR POSITIVE (A) NEGATIVE Final    Comment:        The GeneXpert MRSA Assay (FDA approved for NASAL specimens only), is one component of a comprehensive MRSA colonization surveillance program. It is not intended to diagnose MRSA infection nor to guide or monitor treatment for MRSA infections. RESULT CALLED TO, READ BACK BY AND VERIFIED WITH: Jonnie Kind 08/10/20 AT 1257 BY ACR Performed at [facility], [address] [place], [place] [address]   CULTURE, BLOOD (ROUTINE X 2) w Reflex to ID Panel     Status: None (Preliminary result)   Collection Time: 08/10/20 11:55 PM   Specimen: BLOOD  Result Value Ref Range Status   Specimen Description BLOOD BLOOD LEFT HAND  Final   Special Requests   Final    BOTTLES DRAWN AEROBIC AND ANAEROBIC Blood Culture adequate volume   Culture   Final    NO GROWTH 3 DAYS Performed at [facility], [address] [place], [place]  [address]    Report Status PENDING  Incomplete  CULTURE, BLOOD (ROUTINE X 2) w Reflex to ID Panel     Status: None (Preliminary result)   Collection Time: 08/10/20 11:55 PM   Specimen: BLOOD  Result Value Ref Range Status   Specimen Description BLOOD BLOOD RIGHT HAND  Final   Special Requests   Final    BOTTLES DRAWN AEROBIC AND ANAEROBIC Blood Culture adequate volume   Culture   Final    NO GROWTH 3 DAYS Performed at [facility], [address], [place], [place] [address]    Report Status PENDING  Incomplete  Aerobic/Anaerobic Culture w Gram Stain (surgical/deep wound)     Status: None (Preliminary result)   Collection Time: 08/11/20  4:40 PM   Specimen: Wound; Tissue  Result Value Ref Range Status   Specimen Description   Final    WOUND Performed at [facility], [address], [place], [place] [address]    Special Requests   Final    LEFT FOOT Performed at [facility], [address] [place], [place] [address]    Gram Stain   Final    FEW WBC PRESENT,BOTH PMN AND MONONUCLEAR GRAM POSITIVE COCCI Performed at [facility], [place] [address] [place], [place] [address]    Culture   Final    ABUNDANT METHICILLIN RESISTANT STAPHYLOCOCCUS AUREUS NO ANAEROBES ISOLATED; CULTURE IN PROGRESS FOR 5 DAYS    Report Status PENDING  Incomplete   Organism ID, Bacteria METHICILLIN RESISTANT STAPHYLOCOCCUS AUREUS  Final      Susceptibility   Methicillin resistant staphylococcus aureus - MIC*    CIPROFLOXACIN >=8 RESISTANT Resistant     ERYTHROMYCIN <=0.25 SENSITIVE Sensitive     GENTAMICIN <=0.5 SENSITIVE  Sensitive     OXACILLIN >=4 RESISTANT Resistant     TETRACYCLINE >=16 RESISTANT Resistant     VANCOMYCIN <=0.5 SENSITIVE Sensitive     TRIMETH/SULFA <=10 SENSITIVE Sensitive     CLINDAMYCIN <=0.25 SENSITIVE Sensitive     RIFAMPIN <=0.5 SENSITIVE Sensitive     Inducible Clindamycin NEGATIVE Sensitive     * ABUNDANT METHICILLIN RESISTANT  STAPHYLOCOCCUS AUREUS  Aerobic/Anaerobic Culture w Gram Stain (surgical/deep wound)     Status: None (Preliminary result)   Collection Time: 08/12/20  1:38 PM   Specimen: Wound  Result Value Ref Range Status   Specimen Description   Final    WOUND Performed at [facility], [address] [place], [place] [address]    Special Requests   Final    NONE Performed at [facility], [address] [place], [place] [address]    Gram Stain   Final    NO WBC SEEN RARE GRAM POSITIVE COCCI IN CLUSTERS    Culture   Final    FEW STAPHYLOCOCCUS AUREUS SUSCEPTIBILITIES TO FOLLOW Performed at [facility], [address], [place], [place] [address]    Report Status PENDING  Incomplete     Labs: Basic Metabolic Panel: Recent Labs  Lab 08/09/20 1551 08/09/20 2224 08/10/20 0503 08/11/20 0709 08/12/20 0525 08/13/20 0715 08/14/20 0426  NA 125*   < > 127* 130* 129* 132* 133*  K 2.7*   < > 2.9* 3.6 3.5 3.2* 3.7  CL 86*   < > 92* 98 97* 99 100  CO2 26   < > 24 22 24 24 25   GLUCOSE 122*   < > 107* 87 85 95 96  BUN 18   < > 19 23 18 13 15   CREATININE 0.83   < > 0.88 1.03 0.89 0.83 0.81  CALCIUM 9.7   < > 8.8* 9.1 9.2 9.2 9.0  MG 1.5*  --   --  1.8  --  1.4* 1.7   < > = values in this interval not displayed.   Liver Function Tests: Recent Labs  Lab 08/09/20 1551  AST 17  ALT 13  ALKPHOS 61  BILITOT 1.1  PROT 8.4*  ALBUMIN 4.2   No results for input(s): LIPASE, AMYLASE in the last 168 hours. No results for input(s): AMMONIA in the last 168 hours. CBC: Recent Labs  Lab 08/09/20 1551 08/10/20 0503 08/11/20 0709 08/12/20 0525 08/13/20 0715 08/14/20 0426  WBC 14.6* 11.3* 8.9 8.6 5.6 5.4  NEUTROABS 13.3*  --   --   --   --   --   HGB 12.7* 9.7* 10.4* 10.0* 10.0* 9.9*  HCT 35.2* 27.4* 29.0* 28.5* 28.6* 28.1*  MCV 101.7* 102.2* 104.7* 104.0* 103.6* 103.7*  PLT 263 207 204 226 247 270   Cardiac Enzymes: No results for input(s): CKTOTAL, CKMB,  CKMBINDEX, TROPONINI in the last 168 hours. BNP: BNP (last 3 results) Recent Labs    04/22/20 1557  BNP 233.9*    ProBNP (last 3 results) No results for input(s): PROBNP in the last 8760 hours.  CBG: Recent Labs  Lab 08/12/20 0928  GLUCAP 121*       Signed:  Desma Maxim MD.  [facility] 08/14/2020, 12:46 PM

## 2020-08-14 NOTE — Progress Notes (Signed)
Pharmacy Antibiotic Note  [name] is a 85 y.o. male admitted on 08/09/2020 with sepsisand wound infection of L foot with abscess. Patient previously diagnosed with osteomyelitis on L foot and cultures that grew MRSA and PSA in Oct. 2021. Found to have MRSA bacteremia 2/27. TEE with no vegetation. ID and podiatry following. Wound vac placed 3/2 with leak fixed 3/3. PICC placed 3/3. Linezolid added 3/3 due to slow progress with vancomycin. Pharmacy has been consulted for vancomycin dosing.  3/2 Vanc dose 1000 mg @ 0118. Pk 26 @ 0525. Tr 23 @ 1205.  Day 6 abx  Plan: Continue vancomycin 1750 mg IV q24h --Predicted AUC: 523.6, Cmin 17.5 --Monitor Scr on vancomycin Continue linezolid 600 mg q12h  Height: 6' (182.9 cm) Weight: 94.5 kg (208 lb 6.4 oz) IBW/kg (Calculated) : 77.6  Recent Labs  Lab 08/09/20 1549 08/09/20 1551 08/09/20 1804 08/09/20 2224 08/10/20 0503 08/11/20 0709 08/12/20 0525 08/12/20 1205 08/13/20 0715 08/14/20 0426  WBC  --    < >  --   --  11.3* 8.9 8.6  --  5.6 5.4  CREATININE  --    < >  --    < > 0.88 1.03 0.89  --  0.83 0.81  LATICACIDVEN 1.1  --  1.0  --   --   --   --   --   --   --   VANCOTROUGH  --   --   --   --   --   --   --  23*  --   --   VANCOPEAK  --   --   --   --   --   --  26*  --   --   --    < > = values in this interval not displayed.    Estimated Creatinine Clearance: 83.9 mL/min (by C-G formula based on SCr of 0.81 mg/dL).    Antimicrobials this admission: Metronidazole 2/27 x 1 Vancomycin 2/27 >> Cefepime 2/27 >> 3/3 linezolid 600 mg q12h  Microbiology results: 2/27 BCx: 3/4 bottles MRSA 2/28 BCx: NGTD 2/28 MRSA PCR: (+) 3/1 WCx: staph aureus 3/2 WCx: staph aureus  Benn Moulder, PharmD Pharmacy Resident  08/14/2020 8:24 AM

## 2020-08-14 NOTE — Progress Notes (Signed)
PHARMACY CONSULT NOTE FOR:  OUTPATIENT  PARENTERAL ANTIBIOTIC THERAPY (OPAT)  Indication: MRSA bacteremia with L foot infection Regimen: Daptomycin 750mg  IV q24h End date: 08/28/2020  IV antibiotic discharge orders are pended. To discharging provider:  please sign these orders via discharge navigator,  Select New Orders & click on the button choice - Manage This Unsigned Work.     Thank you for allowing pharmacy to be a part of this patient's care.  Doreene Eland, PharmD, BCPS.   Work Cell: [phone] 08/14/2020 12:07 PM

## 2020-08-14 NOTE — Progress Notes (Signed)
Daily Progress Note   Subjective  - 1 Day Post-Op  Follow-up left heel I&D with wound VAC placement.  He is doing well.  No complaints of pain.  He does have neuropathy  Objective Vitals:   08/13/20 1503 08/13/20 2121 08/14/20 0550 08/14/20 0749  BP: 134/87 132/80 121/81 128/81  Pulse: 72 77 63 64  Resp: 19 20  18   Temp: 97.6 F (36.4 C) 97.9 F (36.6 C) 98 F (36.7 C) 97.9 F (36.6 C)  TempSrc:  Oral Oral   SpO2: 100% 96% 98% 97%  Weight:   94.5 kg   Height:        Physical Exam: The wound is still open.  There is surrounding macerated tissue.  The central ulceration is still full-thickness.  Mild fibrotic tissue.  The diffuse cellulitis erythema and edema of the left lower leg has improved quite a bit at this time.      Laboratory CBC    Component Value Date/Time   WBC 5.4 08/14/2020 0426   HGB 9.9 (L) 08/14/2020 0426   HGB 12.7 09/29/2015 0845   HCT 28.1 (L) 08/14/2020 0426   HCT 37.9 09/29/2015 0845   PLT 270 08/14/2020 0426   PLT 263 09/29/2015 0845    BMET    Component Value Date/Time   NA 133 (L) 08/14/2020 0426   NA 133 (A) 11/06/2018 0000   K 3.7 08/14/2020 0426   CL 100 08/14/2020 0426   CO2 25 08/14/2020 0426   GLUCOSE 96 08/14/2020 0426   BUN 15 08/14/2020 0426   BUN 12 11/06/2018 0000   CREATININE 0.81 08/14/2020 0426   CREATININE 0.80 12/12/2019 1107   CALCIUM 9.0 08/14/2020 0426   GFRNONAA >60 08/14/2020 0426   GFRNONAA 83 12/12/2019 1107   GFRAA >60 02/05/2020 1239   GFRAA 96 12/12/2019 1107    Assessment/Planning: Abscess left foot Cellulitis with lower extremity edema left lower leg Neuropathy   Wound VAC was changed by myself today.  The ulceration open wound measures approximately 5 x 2-1/2 cm at this time.  It is full-thickness down to the level of muscle.  The purulence has subsided at this point.  Recommend continue with wound VAC dressing changes for at least the next 2 weeks.  Follow-up in the outpatient clinic with  me.  Appreciate ID recommendations on antibiotics.  Patient will need to be nonweightbearing to the left foot.  From podiatry standpoint patient stable for discharge.  Samara Deist A  08/14/2020, 10:01 AM

## 2020-08-14 NOTE — Progress Notes (Signed)
Date of Admission:  08/09/2020     ID: [name] is a 85 y.o. male  Active Problems:   Sepsis ([place])   MRSA bacteremia    Subjective: Feeling better Waiting to go to home Left foot swelling better  Medications:  . amLODipine  2.5 mg Oral Daily  . aspirin EC  81 mg Oral Daily  . azelastine  1 spray Each Nare BID  . B-complex with vitamin C  1 tablet Oral Daily  . calcium citrate-vitamin D  1 tablet Oral Daily  . Chlorhexidine Gluconate Cloth  6 each Topical Q0600  . enoxaparin (LOVENOX) injection  40 mg Subcutaneous Q24H  . folic acid  1 mg Oral Daily  . furosemide  40 mg Oral Daily  . gabapentin  1,200 mg Oral BID  . linezolid  600 mg Oral Q12H  . melatonin  5 mg Oral QHS  . pantoprazole  40 mg Oral Daily  . potassium chloride  40 mEq Oral Daily  . pravastatin  80 mg Oral Daily  . primidone  50 mg Oral TID  . sodium chloride flush  10-40 mL Intracatheter Q12H  . tretinoin  1 application Topical QHS  . triamcinolone  1 application Topical BID  . cyanocobalamin  1,000 mcg Oral Daily    Objective: Vital signs in last 24 hours: Temp:  [97.6 F (36.4 C)-98 F (36.7 C)] 97.9 F (36.6 C) (03/04 1217) Pulse Rate:  [63-77] 63 (03/04 1217) Resp:  [18-20] 18 (03/04 1217) BP: (121-134)/(71-87) 129/71 (03/04 1217) SpO2:  [95 %-100 %] 95 % (03/04 1217) Weight:  [94.5 kg] 94.5 kg (03/04 0550)  PHYSICAL EXAM:  General: Alert, cooperative, no distress,  Lungs: Clear to auscultation bilaterally. No Wheezing or Rhonchi. No rales. Heart: Regular rate and rhythm, no murmur, rub or gallop. Abdomen: Soft, non-tender,not distended. Bowel sounds normal. No masses Extremities:left foot swelling and erythema better    Skin: No rashes or lesions. Or bruising Lymph: Cervical, supraclavicular normal. Neurologic:claw hands  Lab Results Recent Labs    08/13/20 0715 08/14/20 0426  WBC 5.6 5.4  HGB 10.0* 9.9*  HCT 28.6* 28.1*  NA 132* 133*  K 3.2* 3.7  CL 99 100  CO2 24 25   BUN 13 15  CREATININE 0.83 0.81   Liver Panel No results for input(s): PROT, ALBUMIN, AST, ALT, ALKPHOS, BILITOT, BILIDIR, IBILI in the last 72 hours. Sedimentation Rate No results for input(s): ESRSEDRATE in the last 72 hours. C-Reactive Protein No results for input(s): CRP in the last 72 hours.  Microbiology:  Studies/Results: ECHO TEE  Result Date: 08/13/2020    TRANSESOPHOGEAL ECHO REPORT   Patient Name:   [name]  Date of Exam: 08/13/2020 Medical Rec #:  [id number]  Height:       72.0 in Accession #:    [id number] Weight:       216.0 lb Date of Birth:  08-May-1936  BSA:          2.202 m Patient Age:    46 years   BP:           110/59 mmHg Patient Gender: M          HR:           89 bpm. Exam Location:  [facility] Procedure: Transesophageal Echo, Color Doppler, Cardiac Doppler and Saline            Contrast Bubble Study Indications:     Not listed on front of TEE check-in  sheet  History:         Patient has prior history of Echocardiogram examinations, most                  recent 08/10/2020. Previous Myocardial Infarction; Risk                  Factors:Hypertension.  Sonographer:     Sherrie Sport RDCS (AE) Referring Phys:  [place] Diagnosing Phys: Serafina Royals MD PROCEDURE: The transesophogeal probe was passed without difficulty through the esophogus of the patient. Sedation performed by performing physician. The patient developed no complications during the procedure. IMPRESSIONS  1. Left ventricular ejection fraction, by estimation, is 55 to 60%. The left ventricle has normal function. The left ventricle has no regional wall motion abnormalities.  2. Right ventricular systolic function is normal. The right ventricular size is normal.  3. Left atrial size was mildly dilated. No left atrial/left atrial appendage thrombus was detected.  4. The mitral valve is normal in structure. Mild mitral valve regurgitation.  5. The aortic valve is normal in structure. Aortic valve regurgitation is  trivial.  6. There is Moderate (Grade III) layered plaque involving the transverse aorta and descending aorta.  7. Agitated saline contrast bubble study was negative, with no evidence of any interatrial shunt. FINDINGS  Left Ventricle: Left ventricular ejection fraction, by estimation, is 55 to 60%. The left ventricle has normal function. The left ventricle has no regional wall motion abnormalities. The left ventricular internal cavity size was small. Right Ventricle: The right ventricular size is normal. No increase in right ventricular wall thickness. Right ventricular systolic function is normal. Left Atrium: Left atrial size was mildly dilated. No left atrial/left atrial appendage thrombus was detected. Right Atrium: Right atrial size was normal in size. Pericardium: There is no evidence of pericardial effusion. Mitral Valve: Calcified mitral leaflet tips and cords. The mitral valve is normal in structure. Mild mitral valve regurgitation. There is no evidence of mitral valve vegetation. Tricuspid Valve: The tricuspid valve is normal in structure. Tricuspid valve regurgitation is mild. There is no evidence of tricuspid valve vegetation. Aortic Valve: The aortic valve is normal in structure. Aortic valve regurgitation is trivial. There is no evidence of aortic valve vegetation. Pulmonic Valve: The pulmonic valve was normal in structure. Pulmonic valve regurgitation is trivial. Aorta: The aortic root and ascending aorta are structurally normal, with no evidence of dilitation. There is moderate (Grade III) layered plaque involving the transverse aorta and descending aorta. IAS/Shunts: No atrial level shunt detected by color flow Doppler. Agitated saline contrast was given intravenously to evaluate for intracardiac shunting. Agitated saline contrast bubble study was negative, with no evidence of any interatrial shunt. There  is no evidence of a patent foramen ovale. There is no evidence of an atrial septal defect.  Serafina Royals MD Electronically signed by Serafina Royals MD Signature Date/Time: 08/13/2020/5:23:59 PM    Final    [place] EKG SITE RITE  Result Date: 08/13/2020 If Site Rite image not attached, placement could not be confirmed due to current cardiac rhythm.    Assessment/Plan:  MRSA bacteremia Repeat blood culture negative so far TEE negative  Left foot cellulitis with abscess of the left heel underwent debridement   Currently on vancomycin and linezolid added yesterday Some improvement- will change both the antibiotics to daptomycin.  will need IV for another 2-3 weeks  Peripheral neuropathy  CAD status post CABG  On Pravachol and aspirin The former will  have to be on hold while on dapto  Discussed the management with the patient, And the care team.  ID will follow him peripherally this weekend- call if needed

## 2020-08-14 NOTE — Progress Notes (Signed)
Physical Therapy Treatment Patient Details Name: [name] MRN: [id number] DOB: May 25, 1936 Today's Date: 08/14/2020    History of Present Illness [name] is a 85 y.o. 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  Patient was seen in urgent care.  Patient states that he had left leg redness that is been getting worse over the past 4 days.  He states that he is not able to feel a lot of pain secondary to his neuropathy.  He states that he had a wound on the bottom of his heel but is unsure how he got there.  The redness has gotten worse, nothing makes it better, nothing makes it worse.  He states that he has not been on any antibiotics.  Patient noted to have a temperature of 37.2 and was tachycardic.  Patient was sent over here for IV antibiotics and hospital admission due to concern for sepsis.  Patient is not on any blood thinners.    PT Comments    Pt put forth good effort during the session and followed commands well.  Pt required extensive time and effort and min A with lateral scooting training to/from various surfaces.  Pt's spouse present for extensive training in proper use, set-up, break-down, and management of drop arm BSC and w/c as well as proper sequencing for transfers to/from each.  Pt made multiple attempts to come to standing from a w/c using Bil arm rests to push from but even with heavy assist was unable to do so.  Pt and spouse both concerned with pt's ability to manage at home safely and verbalized a desire to discharge to SNF instead of home, MD and SW made aware.  Pt will benefit from PT services in a SNF setting upon discharge to safely address deficits listed in patient problem list for decreased caregiver assistance and eventual return to PLOF.    Follow Up Recommendations  SNF     Equipment Recommendations  Wheelchair cushion (measurements PT);Wheelchair (measurements PT)    Recommendations for Other Services        Precautions / Restrictions Precautions Precautions: Fall Restrictions Weight Bearing Restrictions: Yes LLE Weight Bearing: Non weight bearing    Mobility  Bed Mobility Overal bed mobility: Modified Independent             General bed mobility comments: Extra time and effort and use of the bedrail required    Transfers Overall transfer level: Needs assistance Equipment used: Rolling walker (2 wheeled);None Transfers: Lateral/Scoot Transfers;Sit to/from Stand Sit to Stand: Total assist        Lateral/Scoot Transfers: From elevated surface;Min assist General transfer comment: Mod verbal cues for hand plancement, general sequencing, and WB compliance  Ambulation/Gait             General Gait Details: unable   Stairs             Wheelchair Mobility    Modified Rankin (Stroke Patients Only)       Balance Overall balance assessment: Needs assistance Sitting-balance support: Feet supported;Single extremity supported Sitting balance-Leahy Scale: Good         Standing balance comment: unable to stand                            Cognition Arousal/Alertness: Awake/alert Behavior During Therapy: WFL for tasks assessed/performed;Agitated Overall Cognitive Status: Within Functional Limits for tasks assessed  General Comments: Occasionally mildly agitated but followed commands well      Exercises Other Exercises Other Exercises: Extensive pt/spouse education on wheelchair management including set-up and break-down as well as use of breaks and management of leg rests Other Exercises: Extensive pt/spouse education on drop arm BSC use and management including set-up and break-down Other Exercises: Extensive pt and spouse education on proper lateral scoot transfer technique/sequencing to/from [facility], w/c, bed, and car    General Comments        Pertinent Vitals/Pain Pain Assessment: 0-10 Pain  Score: 3  Pain Location: B feet Pain Descriptors / Indicators: Sore Pain Intervention(s): Premedicated before session;Monitored during session    Home Living                      Prior Function            PT Goals (current goals can now be found in the care plan section) Progress towards PT goals: Progressing toward goals    Frequency    Min 2X/week      PT Plan Current plan remains appropriate    Co-evaluation              AM-PAC PT "6 Clicks" Mobility   Outcome Measure  Help needed turning from your back to your side while in a flat bed without using bedrails?: A Little Help needed moving from lying on your back to sitting on the side of a flat bed without using bedrails?: A Little Help needed moving to and from a bed to a chair (including a wheelchair)?: A Little Help needed standing up from a chair using your arms (e.g., wheelchair or bedside chair)?: Total Help needed to walk in hospital room?: Total Help needed climbing 3-5 steps with a railing? : Total 6 Click Score: 12    End of Session Equipment Utilized During Treatment: Gait belt Activity Tolerance: Patient tolerated treatment well Patient left: with bed alarm set;with call bell/phone within reach;in bed;with family/visitor present Nurse Communication: Mobility status;Weight bearing status PT Visit Diagnosis: Other abnormalities of gait and mobility (R26.89);Muscle weakness (generalized) (M62.81)     Time: 9191-6606 PT Time Calculation (min) (ACUTE ONLY): 55 min  Charges:  $Therapeutic Activity: 53-67 mins                     D. Scott Tanner PT, DPT 08/14/20, 5:22 PM

## 2020-08-14 NOTE — Evaluation (Signed)
Occupational Therapy Evaluation Patient Details Name: [name] MRN: [id number] DOB: Feb 16, 1936 Today's Date: 08/14/2020    History of Present Illness [name] is a 85 y.o. 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  Patient was seen in urgent care.  Patient states that he had left leg redness that is been getting worse over the past 4 days.  He states that he is not able to feel a lot of pain secondary to his neuropathy.  He states that he had a wound on the bottom of his heel but is unsure how he got there.  The redness has gotten worse, nothing makes it better, nothing makes it worse.  He states that he has not been on any antibiotics.  Patient noted to have a temperature of 37.2 and was tachycardic.  Patient was sent over here for IV antibiotics and hospital admission due to concern for sepsis.  Patient is not on any blood thinners.   Clinical Impression   [name] was seen for OT evaluation this date. Prior to hospital admission, pt was MOD I using RW. Pt lives in home c ramped entrance. Pt presents to acute OT demonstrating impaired ADL performance and functional mobility 2/2 decreased activity tolerance, functional strength/ROM/balance deficits, and poor safety awareness. Pt currently requires MAX A don/doff B socks at bed level. MIN A for lateral scoot t/f bed>chair. SETUp face washing reclined in chair. Pt would benefit from skilled OT to address noted impairments and functional limitations (see below for any additional details) in order to maximize safety and independence while minimizing falls risk and caregiver burden. Upon hospital discharge, recommend STR to maximize pt safety and return to PLOF.     Follow Up Recommendations  SNF    Equipment Recommendations  Other (comment) (bariatric drop arm BSC)    Recommendations for Other Services       Precautions / Restrictions Precautions Precautions: Fall Restrictions Weight Bearing  Restrictions: Yes LLE Weight Bearing: Non weight bearing      Mobility Bed Mobility Overal bed mobility: Modified Independent                  Transfers Overall transfer level: Needs assistance Equipment used: None Transfers: Lateral/Scoot Transfers          Lateral/Scoot Transfers: Min assist;From elevated surface General transfer comment: Cues for hand placement and maintaining NWBing pcns    Balance Overall balance assessment: Needs assistance Sitting-balance support: Feet supported;Single extremity supported Sitting balance-Leahy Scale: Fair                                     ADL either performed or assessed with clinical judgement   ADL Overall ADL's : Needs assistance/impaired                                       General ADL Comments: MAX A don/doff B socks at bed level. MIN A for lateral scoot t/f bed>chair. SETUp face washing reclined in chair                  Pertinent Vitals/Pain Pain Assessment: Faces     Hand Dominance     Extremity/Trunk Assessment Upper Extremity Assessment Upper Extremity Assessment: Generalized weakness   Lower Extremity Assessment Lower Extremity Assessment: Generalized weakness  Communication Communication Communication: No difficulties   Cognition Arousal/Alertness: Awake/alert Behavior During Therapy: Agitated Overall Cognitive Status: Within Functional Limits for tasks assessed                                 General Comments: Gruff, poor safety awareness and poor insihgt into deficits   General Comments       Exercises Exercises: Other exercises Other Exercises Other Exercises: Pt educated re: OT role, DME recs, d/c recs, falls prevention, ECS, funcitonal application of NWBing pcns Other Exercises: LBD, grooming, bed mobility, lateral scoot tf   Shoulder Instructions      Home Living Family/patient expects to be discharged to:: Private  residence Living Arrangements: Spouse/significant other Available Help at Discharge: Family;Available 24 hours/day Type of Home: House Home Access: Ramped entrance     Home Layout: One level     Bathroom Shower/Tub: Tub/shower unit         Home Equipment: Walker - 2 wheels          Prior Functioning/Environment Level of Independence: Independent with assistive device(s)        Comments: states he was driving, using RW        OT Problem List: Decreased range of motion;Decreased strength;Decreased activity tolerance;Impaired balance (sitting and/or standing);Decreased safety awareness;Decreased knowledge of precautions;Decreased knowledge of use of DME or AE      OT Treatment/Interventions: Self-care/ADL training;Therapeutic exercise;DME and/or AE instruction;Energy conservation;Therapeutic activities;Patient/family education;Balance training    OT Goals(Current goals can be found in the care plan section) Acute Rehab OT Goals Patient Stated Goal: none stated OT Goal Formulation: With patient Time For Goal Achievement: 08/28/20 Potential to Achieve Goals: Good  OT Frequency: Min 1X/week    AM-PAC OT "6 Clicks" Daily Activity     Outcome Measure Help from another person eating meals?: None Help from another person taking care of personal grooming?: A Little Help from another person toileting, which includes using toliet, bedpan, or urinal?: A Lot Help from another person bathing (including washing, rinsing, drying)?: A Lot Help from another person to put on and taking off regular upper body clothing?: A Little Help from another person to put on and taking off regular lower body clothing?: A Lot 6 Click Score: 16   End of Session    Activity Tolerance: Patient tolerated treatment well Patient left: in chair;with call bell/phone within reach;with nursing/sitter in room (NT in room)  OT Visit Diagnosis: Other abnormalities of gait and mobility (R26.89);Muscle  weakness (generalized) (M62.81)                Time: 2426-8341 OT Time Calculation (min): 14 min Charges:  OT General Charges $OT Visit: 1 Visit OT Evaluation $OT Eval Low Complexity: 1 Low OT Treatments $Self Care/Home Management : 8-22 mins  [name], M.S. OTR/L  08/14/20, 10:53 AM  ascom [phone]

## 2020-08-14 NOTE — Treatment Plan (Addendum)
Diagnosis: MRSA bacteremia and left foot infection Baseline Creatinine -<1    Allergies  Allergen Reactions  . Codeine Nausea And Vomiting  . Indocin [Indomethacin] Other (See Comments)  . Latex Other (See Comments)    Blisters  . Mupirocin     Blisters  . Plavix [Clopidogrel Bisulfate]     GI intolerance, n/v  . Polysporin [Bacitracin-Polymyxin B]     Blisters  . Shellfish Allergy   . Tape   . Neosporin [Neomycin-Bacitracin Zn-Polymyx] Rash  . Other Nausea And Vomiting    mussels    OPAT Orders Discharge antibiotics: Daptomycin 750mg  IVPB  every 24 hours End Date: 09/04/20 ( may need more)  [facility] Care Per Protocol:  Labs weekly Monday while on IV antibiotics: _X_ CBC with differential _X_ CMP _X_ ESR X CRP X CPK   Please leave PICC at the completion of antibiotic until seen by provider  Fax weekly labs to [phone]  Clinic Follow Up Appt: 08/20/20 at 11am   Call [phone] with any questions

## 2020-08-14 NOTE — Progress Notes (Signed)
PROGRESS NOTE    [name]   ALP:[id number]  DOB: July 18, 1935  PCP: [name], DO    DOA: 08/09/2020 LOS: 4   Brief Narrative   Summary of HPI on admission: 85 y.o. male with medical history significant for idiopathic peripheral neuropathy, hypertension, hyperlipidemia, GERD, presented to the ED on 08/09/2020 for chief concerns of worsening left leg pain since 2/24.  No preceding trauma.  No fevers or chills.  ED course: Temp 100.20F, tachycardic HR 106, respirations 23.  Labs notable for hyponatremia 125, hypokalemia 2.7, hypochloremia 86, hypomagnesemia 1.5, leukocytosis 14.6, mild anemia hemoglobin 12.7 macrocytic.  Negative COVID-19 and influenza's on PCR.  UA with moderate hemoglobin but negative for infection.  Blood cultures were drawn, returned positive for MRSA.  Hospital course to date: Admitted to hospitalist service for further evaluation and management of sepsis due to left foot cellulitis and multiple electrolyte derangements.  Left lower extremity venous US negative for DVT. 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 MRI left ankle negative for signs of abscess or osteomyelitis, but showed diffuse cellulitis of the ankle and hindfoot, sprain and partial tear of the ATFL and small joint effusion.  On broad-spectrum antibiotic coverage with vancomycin and cefepime.  Infectious disease consulted.   Assessment & Plan   Active Problems:   Sepsis ([facility])   MRSA bacteremia    Sepsis secondary to MRSA bacteremia and Left Foot Cellulitis with abscess Sepsis POA as evidenced by leukocytosis, tachycardia, tachypnea.  Treated per protocol in the ED. S/p I and D by podiatry on 3/2. TTE/TEE w/o signs vegetations. Picc placed 3/3. Infection improving. Podiatry has seen, ok to discharge. - continue wound vac - ID following, transitioned to daptomycin today - patient set to  discharge today to home, much work expended to set things up for home discharge, this despite advising snf. Just prior to discharge family requested snf. Will get to work on that.  Hypokalemia - suspect secondary to diuretics Resolved - continue daily potassium  Hyponatremia - POA, chronic. May be 2/2 thiazide. Improving off thiazide - hold hctz, monitor  Chronic systolic CHF / CAD / MR / TR - all appear stable.  Monitor volume status, renal function, electrolytes --Continue ASA --cont home lasix  Hypotension POA / Hx of Hypertension -resumed amlodipine 2.5 mg, lasix -Held: home HCTZ -Hydralazine PRN  Hyperlipidemia-pravastatin 80 mg daily  Idiopathic peripheral neuropathy-resumed gabapentin 1200 mg twice daily p.o.  Insomnia-melatonin 5 mg daily at bedtime  GERD-PPI   DVT prophylaxis: enoxaparin (LOVENOX) injection 40 mg Start: 08/09/20 2000 Place TED hose Start: 08/09/20 1758   Diet:  Diet Orders (From admission, onward)    Start     Ordered   08/14/20 0000  Diet - low sodium heart healthy        08/14/20 1138   08/13/20 0923  Diet regular Room service appropriate? Yes; Fluid consistency: Thin  Diet effective now       Question Answer Comment  Room service appropriate? Yes   Fluid consistency: Thin      08/13/20 0922            Code Status: Full Code    Subjective 08/14/20    Feeling well, no pain, has appetite, no n/v/d.   Disposition Plan & Communication   Status is: Inpatient  Inpatient status appropriate due to severity of illness on IV antibiotics for MRSA bacteremia.  Dispo: The patient is  from: Home              Anticipated d/c is to: SNF              Patient currently is NOT medically stable for d/c.   Difficult to place patient No   Family Communication: wife at bedside on rounds   Consults, Procedures, Significant Events   Consultants:   Infectious Disease  Podiatry  Procedures:   None  Antimicrobials:  Anti-infectives  (From admission, onward)   Start     Dose/Rate Route Frequency Ordered Stop   08/15/20 0000  daptomycin (CUBICIN) IVPB        750 mg Intravenous Every 24 hours 08/14/20 1407 08/29/20 2359   08/14/20 1430  DAPTOmycin (CUBICIN) 750 mg in sodium chloride 0.9 % IVPB        750 mg 230 mL/hr over 30 Minutes Intravenous Daily 08/14/20 1224     08/14/20 0000  linezolid (ZYVOX) 600 MG tablet  Status:  Discontinued        600 mg Oral Every 12 hours 08/14/20 1138 08/14/20    08/13/20 1800  linezolid (ZYVOX) tablet 600 mg        600 mg Oral Every 12 hours 08/13/20 1653     08/13/20 1000  vancomycin (VANCOREADY) IVPB 1750 mg/350 mL  Status:  Discontinued        1,750 mg 175 mL/hr over 120 Minutes Intravenous Every 24 hours 08/12/20 1446 08/14/20 1224   08/12/20 1157  vancomycin (VANCOCIN) 1-5 GM/200ML-% IVPB       Note to Pharmacy: Norton Blizzard  : cabinet override      08/12/20 1157 08/12/20 2359   08/10/20 1100  vancomycin (VANCOREADY) IVPB 1000 mg/200 mL  Status:  Discontinued        1,000 mg 200 mL/hr over 60 Minutes Intravenous Every 12 hours 08/09/20 1905 08/12/20 1254   08/09/20 2345  ceFEPIme (MAXIPIME) 2 g in sodium chloride 0.9 % 100 mL IVPB  Status:  Discontinued        2 g 200 mL/hr over 30 Minutes Intravenous Every 8 hours 08/09/20 1852 08/09/20 1854   08/09/20 2345  ceFEPIme (MAXIPIME) 2 g in sodium chloride 0.9 % 100 mL IVPB  Status:  Discontinued        2 g 200 mL/hr over 30 Minutes Intravenous Every 8 hours 08/09/20 1854 08/13/20 1327   08/09/20 1930  vancomycin (VANCOCIN) IVPB 1000 mg/200 mL premix  Status:  Discontinued        1,000 mg 200 mL/hr over 60 Minutes Intravenous  Once 08/09/20 1852 08/09/20 1854   08/09/20 1930  vancomycin (VANCOCIN) IVPB 1000 mg/200 mL premix        1,000 mg 200 mL/hr over 60 Minutes Intravenous  Once 08/09/20 1854 08/10/20 0020   08/09/20 1600  ceFEPIme (MAXIPIME) 2 g in sodium chloride 0.9 % 100 mL IVPB        2 g 200 mL/hr over 30 Minutes  Intravenous  Once 08/09/20 1555 08/09/20 1636   08/09/20 1600  metroNIDAZOLE (FLAGYL) IVPB 500 mg        500 mg 100 mL/hr over 60 Minutes Intravenous  Once 08/09/20 1555 08/09/20 1750   08/09/20 1600  vancomycin (VANCOCIN) IVPB 1000 mg/200 mL premix        1,000 mg 200 mL/hr over 60 Minutes Intravenous  Once 08/09/20 1555 08/09/20 1926        Micro    Objective   Vitals:   08/13/20  2121 08/14/20 0550 08/14/20 0749 08/14/20 1217  BP: 132/80 121/81 128/81 129/71  Pulse: 77 63 64 63  Resp: 20  18 18   Temp: 97.9 F (36.6 C) 98 F (36.7 C) 97.9 F (36.6 C) 97.9 F (36.6 C)  TempSrc: Oral Oral    SpO2: 96% 98% 97% 95%  Weight:  94.5 kg    Height:        Intake/Output Summary (Last 24 hours) at 08/14/2020 1610 Last data filed at 08/14/2020 1538 Gross per 24 hour  Intake 115 ml  Output 3525 ml  Net -3410 ml   Filed Weights   08/13/20 0504 08/13/20 0751 08/14/20 0550  Weight: 98 kg 98 kg 94.5 kg    Physical Exam:  General exam: awake, alert, no acute distress Respiratory system: symmetric chest rise, normal respiratory effort, on room air. Cardiovascular system: RRR, no RLE edema, LLE edema due to cellulitis.   Central nervous system: A&O x3. no gross focal neurologic deficits, normal speech Extremities: left foot wound vac applied, erythematous and edematous to mid calf, improving Psychiatry: normal mood, congruent affect, judgement and insight appear normal  Labs   Data Reviewed: I have personally reviewed following labs and imaging studies  CBC: Recent Labs  Lab 08/09/20 1551 08/10/20 0503 08/11/20 0709 08/12/20 0525 08/13/20 0715 08/14/20 0426  WBC 14.6* 11.3* 8.9 8.6 5.6 5.4  NEUTROABS 13.3*  --   --   --   --   --   HGB 12.7* 9.7* 10.4* 10.0* 10.0* 9.9*  HCT 35.2* 27.4* 29.0* 28.5* 28.6* 28.1*  MCV 101.7* 102.2* 104.7* 104.0* 103.6* 103.7*  PLT 263 207 204 226 247 426   Basic Metabolic Panel: Recent Labs  Lab 08/09/20 1551 08/09/20 2224  08/10/20 0503 08/11/20 0709 08/12/20 0525 08/13/20 0715 08/14/20 0426  NA 125*   < > 127* 130* 129* 132* 133*  K 2.7*   < > 2.9* 3.6 3.5 3.2* 3.7  CL 86*   < > 92* 98 97* 99 100  CO2 26   < > 24 22 24 24 25   GLUCOSE 122*   < > 107* 87 85 95 96  BUN 18   < > 19 23 18 13 15   CREATININE 0.83   < > 0.88 1.03 0.89 0.83 0.81  CALCIUM 9.7   < > 8.8* 9.1 9.2 9.2 9.0  MG 1.5*  --   --  1.8  --  1.4* 1.7   < > = values in this interval not displayed.   GFR: Estimated Creatinine Clearance: 83.9 mL/min (by C-G formula based on SCr of 0.81 mg/dL). Liver Function Tests: Recent Labs  Lab 08/09/20 1551  AST 17  ALT 13  ALKPHOS 61  BILITOT 1.1  PROT 8.4*  ALBUMIN 4.2   No results for input(s): LIPASE, AMYLASE in the last 168 hours. No results for input(s): AMMONIA in the last 168 hours. Coagulation Profile: No results for input(s): INR, PROTIME in the last 168 hours. Cardiac Enzymes: Recent Labs  Lab 08/14/20 0426  CKTOTAL 23*   BNP (last 3 results) No results for input(s): PROBNP in the last 8760 hours. HbA1C: No results for input(s): HGBA1C in the last 72 hours. CBG: Recent Labs  Lab 08/12/20 0928  GLUCAP 121*   Lipid Profile: No results for input(s): CHOL, HDL, LDLCALC, TRIG, CHOLHDL, LDLDIRECT in the last 72 hours. Thyroid Function Tests: No results for input(s): TSH, T4TOTAL, FREET4, T3FREE, THYROIDAB in the last 72 hours. Anemia Panel: No results for input(s):  VITAMINB12, FOLATE, FERRITIN, TIBC, IRON, RETICCTPCT in the last 72 hours. Sepsis Labs: Recent Labs  Lab 08/09/20 1549 08/09/20 1804  LATICACIDVEN 1.1 1.0    Recent Results (from the past 240 hour(s))  Blood culture (routine x 2)     Status: Abnormal   Collection Time: 08/09/20  3:49 PM   Specimen: BLOOD  Result Value Ref Range Status   Specimen Description   Final    BLOOD BLOOD LEFT FOREARM Performed at [facility], [address] [place], [place] [address]    Special Requests   Final     BOTTLES DRAWN AEROBIC AND ANAEROBIC Blood Culture adequate volume Performed at [facility], [address] [place], [place] [address]    Culture  Setup Time   Final    GRAM POSITIVE COCCI AEROBIC BOTTLE ONLY CRITICAL RESULT CALLED TO, READ BACK BY AND VERIFIED WITH: NATHAN BELUE ON 08/10/20 AT 2440 QSD Performed at [facility], [place] [address] [place], [place] [address]    Culture METHICILLIN RESISTANT STAPHYLOCOCCUS AUREUS (A)  Final   Report Status 08/12/2020 FINAL  Final   Organism ID, Bacteria METHICILLIN RESISTANT STAPHYLOCOCCUS AUREUS  Final      Susceptibility   Methicillin resistant staphylococcus aureus - MIC*    CIPROFLOXACIN >=8 RESISTANT Resistant     ERYTHROMYCIN <=0.25 SENSITIVE Sensitive     GENTAMICIN <=0.5 SENSITIVE Sensitive     OXACILLIN >=4 RESISTANT Resistant     TETRACYCLINE >=16 RESISTANT Resistant     VANCOMYCIN 1 SENSITIVE Sensitive     TRIMETH/SULFA <=10 SENSITIVE Sensitive     CLINDAMYCIN <=0.25 SENSITIVE Sensitive     RIFAMPIN <=0.5 SENSITIVE Sensitive     Inducible Clindamycin NEGATIVE Sensitive     * METHICILLIN RESISTANT STAPHYLOCOCCUS AUREUS  Blood Culture ID Panel (Reflexed)     Status: Abnormal   Collection Time: 08/09/20  3:49 PM  Result Value Ref Range Status   Enterococcus faecalis NOT DETECTED NOT DETECTED Final   Enterococcus Faecium NOT DETECTED NOT DETECTED Final   Listeria monocytogenes NOT DETECTED NOT DETECTED Final   Staphylococcus species DETECTED (A) NOT DETECTED Final    Comment: CRITICAL RESULT CALLED TO, READ BACK BY AND VERIFIED WITH: NATHAN BELUE ON 08/10/20 AT 5366 QSD    Staphylococcus aureus (BCID) DETECTED (A) NOT DETECTED Final    Comment: Methicillin (oxacillin)-resistant Staphylococcus aureus (MRSA). MRSA is predictably resistant to beta-lactam antibiotics (except ceftaroline). Preferred therapy is vancomycin unless clinically contraindicated. Patient requires contact precautions if   hospitalized. CRITICAL RESULT CALLED TO, READ BACK BY AND VERIFIED WITH: NATHAN BELUE ON 08/10/20 AT 0616 QSD    Staphylococcus epidermidis NOT DETECTED NOT DETECTED Final   Staphylococcus lugdunensis NOT DETECTED NOT DETECTED Final   Streptococcus species NOT DETECTED NOT DETECTED Final   Streptococcus agalactiae NOT DETECTED NOT DETECTED Final   Streptococcus pneumoniae NOT DETECTED NOT DETECTED Final   Streptococcus pyogenes NOT DETECTED NOT DETECTED Final   A.calcoaceticus-baumannii NOT DETECTED NOT DETECTED Final   Bacteroides fragilis NOT DETECTED NOT DETECTED Final   Enterobacterales NOT DETECTED NOT DETECTED Final   Enterobacter cloacae complex NOT DETECTED NOT DETECTED Final   Escherichia coli NOT DETECTED NOT DETECTED Final   Klebsiella aerogenes NOT DETECTED NOT DETECTED Final   Klebsiella oxytoca NOT DETECTED NOT DETECTED Final   Klebsiella pneumoniae NOT DETECTED NOT DETECTED Final   Proteus species NOT DETECTED NOT DETECTED Final   Salmonella species NOT DETECTED NOT DETECTED Final   Serratia marcescens NOT DETECTED NOT  DETECTED Final   Haemophilus influenzae NOT DETECTED NOT DETECTED Final   Neisseria meningitidis NOT DETECTED NOT DETECTED Final   Pseudomonas aeruginosa NOT DETECTED NOT DETECTED Final   Stenotrophomonas maltophilia NOT DETECTED NOT DETECTED Final   Candida albicans NOT DETECTED NOT DETECTED Final   Candida auris NOT DETECTED NOT DETECTED Final   Candida glabrata NOT DETECTED NOT DETECTED Final   Candida krusei NOT DETECTED NOT DETECTED Final   Candida parapsilosis NOT DETECTED NOT DETECTED Final   Candida tropicalis NOT DETECTED NOT DETECTED Final   Cryptococcus neoformans/gattii NOT DETECTED NOT DETECTED Final   Meth resistant mecA/C and MREJ DETECTED (A) NOT DETECTED Final    Comment: CRITICAL RESULT CALLED TO, READ BACK BY AND VERIFIED WITH: NATHAN BELUE ON 08/10/20 AT 4034 QSD Performed at [facility], [address] [place],  [place] [address]   Blood culture (routine x 2)     Status: Abnormal   Collection Time: 08/09/20  3:50 PM   Specimen: BLOOD  Result Value Ref Range Status   Specimen Description   Final    BLOOD RIGHT ANTECUBITAL Performed at [facility], [address] [place], [place] [address]    Special Requests   Final    BOTTLES DRAWN AEROBIC AND ANAEROBIC Blood Culture adequate volume Performed at [facility], [address] [place], [place] [address]    Culture  Setup Time   Final    GRAM POSITIVE COCCI IN BOTH AEROBIC AND ANAEROBIC BOTTLES CRITICAL VALUE NOTED.  VALUE IS CONSISTENT WITH PREVIOUSLY REPORTED AND CALLED VALUE. Performed at [facility], [address] [place], [place] [address]    Culture (A)  Final    STAPHYLOCOCCUS AUREUS SUSCEPTIBILITIES PERFORMED ON PREVIOUS CULTURE WITHIN THE LAST 5 DAYS. Performed at [facility], [place] [address], [place], [place] [address]    Report Status 08/12/2020 FINAL  Final  Resp Panel by RT-PCR (Flu A&B, Covid) Nasopharyngeal Swab     Status: None   Collection Time: 08/09/20  4:10 PM   Specimen: Nasopharyngeal Swab; Nasopharyngeal(NP) swabs in vial transport medium  Result Value Ref Range Status   SARS Coronavirus 2 by RT PCR NEGATIVE NEGATIVE Final    Comment: (NOTE) SARS-CoV-2 target nucleic acids are NOT DETECTED.  The SARS-CoV-2 RNA is generally detectable in upper respiratory specimens during the acute phase of infection. The lowest concentration of SARS-CoV-2 viral copies this assay can detect is 138 copies/mL. A negative result does not preclude SARS-Cov-2 infection and should not be used as the sole basis for treatment or other patient management decisions. A negative result may occur with  improper specimen collection/handling, submission of specimen other than nasopharyngeal swab, presence of viral mutation(s) within the areas targeted by this assay, and inadequate number of viral copies(<138  copies/mL). A negative result must be combined with clinical observations, patient history, and epidemiological information. The expected result is Negative.  Fact Sheet for Patients:  EntrepreneurPulse.com.au  Fact Sheet for Healthcare Providers:  IncredibleEmployment.be  This test is no t yet approved or cleared by the [place] FDA and  has been authorized for detection and/or diagnosis of SARS-CoV-2 by FDA under an Emergency Use Authorization (EUA). This EUA will remain  in effect (meaning this test can be used) for the duration of the COVID-19 declaration under Section 564(b)(1) of the Act, 21 U.S.C.section 360bbb-3(b)(1), unless the authorization is terminated  or revoked sooner.       Influenza A by PCR NEGATIVE NEGATIVE Final  Influenza B by PCR NEGATIVE NEGATIVE Final    Comment: (NOTE) The Xpert Xpress SARS-CoV-2/FLU/RSV plus assay is intended as an aid in the diagnosis of influenza from Nasopharyngeal swab specimens and should not be used as a sole basis for treatment. Nasal washings and aspirates are unacceptable for Xpert Xpress SARS-CoV-2/FLU/RSV testing.  Fact Sheet for Patients: EntrepreneurPulse.com.au  Fact Sheet for Healthcare Providers: IncredibleEmployment.be  This test is not yet approved or cleared by the [place] FDA and has been authorized for detection and/or diagnosis of SARS-CoV-2 by FDA under an Emergency Use Authorization (EUA). This EUA will remain in effect (meaning this test can be used) for the duration of the COVID-19 declaration under Section 564(b)(1) of the Act, 21 U.S.C. section 360bbb-3(b)(1), unless the authorization is terminated or revoked.  Performed at [facility], [address] [place], [place] [address]   MRSA PCR Screening     Status: Abnormal   Collection Time: 08/10/20 10:50 AM   Specimen: Nasal Mucosa; Nasopharyngeal  Result  Value Ref Range Status   MRSA by PCR POSITIVE (A) NEGATIVE Final    Comment:        The GeneXpert MRSA Assay (FDA approved for NASAL specimens only), is one component of a comprehensive MRSA colonization surveillance program. It is not intended to diagnose MRSA infection nor to guide or monitor treatment for MRSA infections. RESULT CALLED TO, READ BACK BY AND VERIFIED WITH: Jonnie Kind 08/10/20 AT 1257 BY ACR Performed at [facility], [address] [place], [place] [address]   CULTURE, BLOOD (ROUTINE X 2) w Reflex to ID Panel     Status: None (Preliminary result)   Collection Time: 08/10/20 11:55 PM   Specimen: BLOOD  Result Value Ref Range Status   Specimen Description BLOOD BLOOD LEFT HAND  Final   Special Requests   Final    BOTTLES DRAWN AEROBIC AND ANAEROBIC Blood Culture adequate volume   Culture   Final    NO GROWTH 3 DAYS Performed at [facility], [address] [place], [place] [address]    Report Status PENDING  Incomplete  CULTURE, BLOOD (ROUTINE X 2) w Reflex to ID Panel     Status: None (Preliminary result)   Collection Time: 08/10/20 11:55 PM   Specimen: BLOOD  Result Value Ref Range Status   Specimen Description BLOOD BLOOD RIGHT HAND  Final   Special Requests   Final    BOTTLES DRAWN AEROBIC AND ANAEROBIC Blood Culture adequate volume   Culture   Final    NO GROWTH 3 DAYS Performed at [facility], [address] [place], [place] [address]    Report Status PENDING  Incomplete  Aerobic/Anaerobic Culture w Gram Stain (surgical/deep wound)     Status: None (Preliminary result)   Collection Time: 08/11/20  4:40 PM   Specimen: Wound; Tissue  Result Value Ref Range Status   Specimen Description   Final    WOUND Performed at [facility], [address] [place], [place] [address]    Special Requests   Final    LEFT FOOT Performed at [facility], [address] [place], [place] [address]    Gram  Stain   Final    FEW WBC PRESENT,BOTH PMN AND MONONUCLEAR GRAM POSITIVE COCCI Performed at [facility], [place] [address], [place],  [address]    Culture   Final    ABUNDANT METHICILLIN RESISTANT STAPHYLOCOCCUS AUREUS NO ANAEROBES ISOLATED; CULTURE IN PROGRESS FOR  5 DAYS    Report Status PENDING  Incomplete   Organism ID, Bacteria METHICILLIN RESISTANT STAPHYLOCOCCUS AUREUS  Final      Susceptibility   Methicillin resistant staphylococcus aureus - MIC*    CIPROFLOXACIN >=8 RESISTANT Resistant     ERYTHROMYCIN <=0.25 SENSITIVE Sensitive     GENTAMICIN <=0.5 SENSITIVE Sensitive     OXACILLIN >=4 RESISTANT Resistant     TETRACYCLINE >=16 RESISTANT Resistant     VANCOMYCIN <=0.5 SENSITIVE Sensitive     TRIMETH/SULFA <=10 SENSITIVE Sensitive     CLINDAMYCIN <=0.25 SENSITIVE Sensitive     RIFAMPIN <=0.5 SENSITIVE Sensitive     Inducible Clindamycin NEGATIVE Sensitive     * ABUNDANT METHICILLIN RESISTANT STAPHYLOCOCCUS AUREUS  Aerobic/Anaerobic Culture w Gram Stain (surgical/deep wound)     Status: None (Preliminary result)   Collection Time: 08/12/20  1:38 PM   Specimen: Wound  Result Value Ref Range Status   Specimen Description   Final    WOUND Performed at [facility], [address] [place], [place] [address]    Special Requests   Final    NONE Performed at [facility], [address] [place], [place] [address]    Gram Stain   Final    NO WBC SEEN RARE GRAM POSITIVE COCCI IN CLUSTERS Performed at [facility], [place] [address], [place], [place] [address]    Culture   Final    FEW STAPHYLOCOCCUS AUREUS SUSCEPTIBILITIES TO FOLLOW NO ANAEROBES ISOLATED; CULTURE IN PROGRESS FOR 5 DAYS    Report Status PENDING  Incomplete      Imaging Studies   ECHO TEE  Result Date: 09/08/20    TRANSESOPHOGEAL ECHO REPORT   Patient Name:   [name]  Date of Exam: 08-Sep-2020 Medical Rec #:  [id number]  Height:       72.0 in Accession #:     [id number] Weight:       216.0 lb Date of Birth:  [birth year]  BSA:          2.202 m Patient Age:    12 years   BP:           110/59 mmHg Patient Gender: M          HR:           89 bpm. Exam Location:  [facility] Procedure: Transesophageal Echo, Color Doppler, Cardiac Doppler and Saline            Contrast Bubble Study Indications:     Not listed on front of TEE check-in sheet  History:         Patient has prior history of Echocardiogram examinations, most                  recent 08/10/2020. Previous Myocardial Infarction; Risk                  Factors:Hypertension.  Sonographer:     Sherrie Sport RDCS (AE) Referring Phys:  Superior Diagnosing Phys: Serafina Royals MD PROCEDURE: The transesophogeal probe was passed without difficulty through the esophogus of the patient. Sedation performed by performing physician. The patient developed no complications during the procedure. IMPRESSIONS  1. Left ventricular ejection fraction, by estimation, is 55 to 60%. The left ventricle has normal function. The left ventricle has no regional wall motion abnormalities.  2. Right ventricular systolic function is normal. The right ventricular size is normal.  3. Left atrial size was mildly dilated. No  left atrial/left atrial appendage thrombus was detected.  4. The mitral valve is normal in structure. Mild mitral valve regurgitation.  5. The aortic valve is normal in structure. Aortic valve regurgitation is trivial.  6. There is Moderate (Grade III) layered plaque involving the transverse aorta and descending aorta.  7. Agitated saline contrast bubble study was negative, with no evidence of any interatrial shunt. FINDINGS  Left Ventricle: Left ventricular ejection fraction, by estimation, is 55 to 60%. The left ventricle has normal function. The left ventricle has no regional wall motion abnormalities. The left ventricular internal cavity size was small. Right Ventricle: The right ventricular size is normal. No increase in right  ventricular wall thickness. Right ventricular systolic function is normal. Left Atrium: Left atrial size was mildly dilated. No left atrial/left atrial appendage thrombus was detected. Right Atrium: Right atrial size was normal in size. Pericardium: There is no evidence of pericardial effusion. Mitral Valve: Calcified mitral leaflet tips and cords. The mitral valve is normal in structure. Mild mitral valve regurgitation. There is no evidence of mitral valve vegetation. Tricuspid Valve: The tricuspid valve is normal in structure. Tricuspid valve regurgitation is mild. There is no evidence of tricuspid valve vegetation. Aortic Valve: The aortic valve is normal in structure. Aortic valve regurgitation is trivial. There is no evidence of aortic valve vegetation. Pulmonic Valve: The pulmonic valve was normal in structure. Pulmonic valve regurgitation is trivial. Aorta: The aortic root and ascending aorta are structurally normal, with no evidence of dilitation. There is moderate (Grade III) layered plaque involving the transverse aorta and descending aorta. IAS/Shunts: No atrial level shunt detected by color flow Doppler. Agitated saline contrast was given intravenously to evaluate for intracardiac shunting. Agitated saline contrast bubble study was negative, with no evidence of any interatrial shunt. There  is no evidence of a patent foramen ovale. There is no evidence of an atrial septal defect. Serafina Royals MD Electronically signed by Serafina Royals MD Signature Date/Time: 08/13/2020/5:23:59 PM    Final    [place] EKG SITE RITE  Result Date: 08/13/2020 If Site Rite image not attached, placement could not be confirmed due to current cardiac rhythm.    Medications   Scheduled Meds: . amLODipine  2.5 mg Oral Daily  . aspirin EC  81 mg Oral Daily  . azelastine  1 spray Each Nare BID  . B-complex with vitamin C  1 tablet Oral Daily  . calcium citrate-vitamin D  1 tablet Oral Daily  . Chlorhexidine Gluconate Cloth   6 each Topical Q0600  . enoxaparin (LOVENOX) injection  40 mg Subcutaneous Q24H  . folic acid  1 mg Oral Daily  . furosemide  40 mg Oral Daily  . gabapentin  1,200 mg Oral BID  . linezolid  600 mg Oral Q12H  . melatonin  5 mg Oral QHS  . pantoprazole  40 mg Oral Daily  . potassium chloride  40 mEq Oral Daily  . pravastatin  80 mg Oral Daily  . primidone  50 mg Oral TID  . sodium chloride flush  10-40 mL Intracatheter Q12H  . tretinoin  1 application Topical QHS  . triamcinolone  1 application Topical BID  . cyanocobalamin  1,000 mcg Oral Daily   Continuous Infusions: . sodium chloride Stopped (08/10/20 1753)  . DAPTOmycin (CUBICIN)  IV Stopped (08/14/20 1515)       LOS: 4 days    Time spent: 52 min    Desma Maxim, MD [facility]  08/14/2020, 4:10  PM      If 7PM-7AM, please contact night-coverage. How to contact the [facility] Attending or Consulting provider [place] or covering provider during after hours [place], for this patient?    1. Check the care team in [facility] and look for a) attending/consulting TR[facility] provider listed and b) the [facility] team listed 2. Log into [email or web address] and use [place]'s universal password to access. If you do not have the password, please contact the hospital operator. 3. Locate the [facility] provider you are looking for under [facility] and page to a number that you can be directly reached. 4. If you still have difficulty reaching the provider, please page the [facility] (Director on Call) for the Hospitalists listed on amion for assistance.

## 2020-08-15 DIAGNOSIS — L03119 Cellulitis of unspecified part of limb: Secondary | ICD-10-CM

## 2020-08-15 DIAGNOSIS — L02619 Cutaneous abscess of unspecified foot: Secondary | ICD-10-CM

## 2020-08-15 LAB — CBC
HCT: 30.9 % — ABNORMAL LOW (ref 39.0–52.0)
Hemoglobin: 10.9 g/dL — ABNORMAL LOW (ref 13.0–17.0)
MCH: 36.2 pg — ABNORMAL HIGH (ref 26.0–34.0)
MCHC: 35.3 g/dL (ref 30.0–36.0)
MCV: 102.7 fL — ABNORMAL HIGH (ref 80.0–100.0)
Platelets: 293 10*3/uL (ref 150–400)
RBC: 3.01 MIL/uL — ABNORMAL LOW (ref 4.22–5.81)
RDW: 11.9 % (ref 11.5–15.5)
WBC: 5.4 10*3/uL (ref 4.0–10.5)
nRBC: 0 % (ref 0.0–0.2)

## 2020-08-15 LAB — BASIC METABOLIC PANEL
Anion gap: 9 (ref 5–15)
BUN: 12 mg/dL (ref 8–23)
CO2: 26 mmol/L (ref 22–32)
Calcium: 9.5 mg/dL (ref 8.9–10.3)
Chloride: 99 mmol/L (ref 98–111)
Creatinine, Ser: 0.77 mg/dL (ref 0.61–1.24)
GFR, Estimated: >60 mL/min (ref >60)
Glucose, Bld: 92 mg/dL (ref 70–99)
Potassium: 3.5 mmol/L (ref 3.5–5.1)
Sodium: 134 mmol/L — ABNORMAL LOW (ref 135–145)

## 2020-08-15 LAB — MAGNESIUM: Magnesium: 1.6 mg/dL — ABNORMAL LOW (ref 1.7–2.4)

## 2020-08-15 MED ORDER — MAGNESIUM SULFATE 2 GM/50ML IV SOLN
2.0000 g | Freq: Once | INTRAVENOUS | Status: AC
  Administered 2020-08-15: 2 g via INTRAVENOUS
  Filled 2020-08-15: qty 50

## 2020-08-15 NOTE — Progress Notes (Signed)
PROGRESS NOTE    [name]   NUU:[id number]  DOB: January 10, 1936  PCP: [name], DO    DOA: 08/09/2020 LOS: 5   Brief Narrative   Summary of HPI on admission: 85 y.o. male with medical history significant for idiopathic peripheral neuropathy, hypertension, hyperlipidemia, GERD, presented to the ED on 08/09/2020 for chief concerns of worsening left leg pain since 2/24.  No preceding trauma.  No fevers or chills.  ED course: Temp 100.50F, tachycardic HR 106, respirations 23.  Labs notable for hyponatremia 125, hypokalemia 2.7, hypochloremia 86, hypomagnesemia 1.5, leukocytosis 14.6, mild anemia hemoglobin 12.7 macrocytic.  Negative COVID-19 and influenza's on PCR.  UA with moderate hemoglobin but negative for infection.  Blood cultures were drawn, returned positive for MRSA.  Hospital course to date: Admitted to hospitalist service for further evaluation and management of sepsis due to left foot cellulitis and multiple electrolyte derangements.  Left lower extremity venous US negative for DVT. 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 MRI left ankle negative for signs of abscess or osteomyelitis, but showed diffuse cellulitis of the ankle and hindfoot, sprain and partial tear of the ATFL and small joint effusion.  On broad-spectrum antibiotic coverage with vancomycin and cefepime.  Infectious disease consulted.   Assessment & Plan   Active Problems:   Sepsis ([place])   MRSA bacteremia    Sepsis secondary to MRSA bacteremia and Left Foot Cellulitis with abscess Sepsis POA as evidenced by leukocytosis, tachycardia, tachypnea.  Sepsis resolved. S/p I and D by podiatry on 3/2. TTE/TEE w/o signs vegetations. Picc placed 3/3. Infection improving. Podiatry has seen, ok to discharge. - continue wound vac, change m/w/f - ID following, transitioned to daptomycin 3/4 - pt has changed mind  and wants snf placement, care mgmt working on that  Hypokalemia - suspect secondary to diuretics Resolved - continue daily potassium  Hyponatremia - POA, chronic. May be 2/2 thiazide. Improving off thiazide - hold hctz, monitor  Chronic systolic CHF / CAD / MR / TR - all appear stable.  Monitor volume status, renal function, electrolytes --Continue ASA --cont home lasix  Hypotension POA / Hx of Hypertension -resumed amlodipine 2.5 mg, lasix -Held: home HCTZ -Hydralazine PRN  Hyperlipidemia -pravastatin 80 mg daily  Idiopathic peripheral neuropathy -resumed gabapentin 1200 mg twice daily p.o.  Insomnia-melatonin 5 mg daily at bedtime  GERD -PPI   DVT prophylaxis: enoxaparin (LOVENOX) injection 40 mg Start: 08/09/20 2000 Place TED hose Start: 08/09/20 1758   Diet:  Diet Orders (From admission, onward)    Start     Ordered   08/14/20 0000  Diet - low sodium heart healthy        08/14/20 1138   08/13/20 0923  Diet regular Room service appropriate? Yes; Fluid consistency: Thin  Diet effective now       Question Answer Comment  Room service appropriate? Yes   Fluid consistency: Thin      08/13/20 0922            Code Status: Full Code    Subjective 08/15/20    Feeling well, no pain, has appetite, no n/v/d.   Disposition Plan & Communication   Status is: Inpatient  Inpatient status appropriate due to severity of illness on IV antibiotics for MRSA bacteremia.  Dispo: The patient is from: Home              Anticipated d/c is to: SNF  Patient currently is medically stable for d/c.   Difficult to place patient No   Family Communication: wife updated bedside 3/4   Consults, Procedures, Significant Events   Consultants:   Infectious Disease  Podiatry  Procedures:   None  Antimicrobials:  Anti-infectives (From admission, onward)   Start     Dose/Rate Route Frequency Ordered Stop   08/15/20 0000  daptomycin (CUBICIN) IVPB         750 mg Intravenous Every 24 hours 08/14/20 1407 08/29/20 2359   08/14/20 1430  DAPTOmycin (CUBICIN) 750 mg in sodium chloride 0.9 % IVPB        750 mg 230 mL/hr over 30 Minutes Intravenous Daily 08/14/20 1224     08/14/20 0000  linezolid (ZYVOX) 600 MG tablet  Status:  Discontinued        600 mg Oral Every 12 hours 08/14/20 1138 08/14/20    08/13/20 1800  linezolid (ZYVOX) tablet 600 mg  Status:  Discontinued        600 mg Oral Every 12 hours 08/13/20 1653 08/14/20 1613   08/13/20 1000  vancomycin (VANCOREADY) IVPB 1750 mg/350 mL  Status:  Discontinued        1,750 mg 175 mL/hr over 120 Minutes Intravenous Every 24 hours 08/12/20 1446 08/14/20 1224   08/12/20 1157  vancomycin (VANCOCIN) 1-5 GM/200ML-% IVPB       Note to Pharmacy: Norton Blizzard  : cabinet override      08/12/20 1157 08/12/20 2359   08/10/20 1100  vancomycin (VANCOREADY) IVPB 1000 mg/200 mL  Status:  Discontinued        1,000 mg 200 mL/hr over 60 Minutes Intravenous Every 12 hours 08/09/20 1905 08/12/20 1254   08/09/20 2345  ceFEPIme (MAXIPIME) 2 g in sodium chloride 0.9 % 100 mL IVPB  Status:  Discontinued        2 g 200 mL/hr over 30 Minutes Intravenous Every 8 hours 08/09/20 1852 08/09/20 1854   08/09/20 2345  ceFEPIme (MAXIPIME) 2 g in sodium chloride 0.9 % 100 mL IVPB  Status:  Discontinued        2 g 200 mL/hr over 30 Minutes Intravenous Every 8 hours 08/09/20 1854 08/13/20 1327   08/09/20 1930  vancomycin (VANCOCIN) IVPB 1000 mg/200 mL premix  Status:  Discontinued        1,000 mg 200 mL/hr over 60 Minutes Intravenous  Once 08/09/20 1852 08/09/20 1854   08/09/20 1930  vancomycin (VANCOCIN) IVPB 1000 mg/200 mL premix        1,000 mg 200 mL/hr over 60 Minutes Intravenous  Once 08/09/20 1854 08/10/20 0020   08/09/20 1600  ceFEPIme (MAXIPIME) 2 g in sodium chloride 0.9 % 100 mL IVPB        2 g 200 mL/hr over 30 Minutes Intravenous  Once 08/09/20 1555 08/09/20 1636   08/09/20 1600  metroNIDAZOLE (FLAGYL) IVPB  500 mg        500 mg 100 mL/hr over 60 Minutes Intravenous  Once 08/09/20 1555 08/09/20 1750   08/09/20 1600  vancomycin (VANCOCIN) IVPB 1000 mg/200 mL premix        1,000 mg 200 mL/hr over 60 Minutes Intravenous  Once 08/09/20 1555 08/09/20 1926        Micro    Objective   Vitals:   08/14/20 1632 08/14/20 2027 08/15/20 0335 08/15/20 0825  BP: (!) 148/81 (!) 167/85 133/78 130/74  Pulse: 72 83 68 70  Resp: 18 16 17 18   Temp: 97.8 F (  36.6 C) 98.5 F (36.9 C) 97.7 F (36.5 C) 98 F (36.7 C)  TempSrc:   Oral Oral  SpO2: 96% 95% 97% 95%  Weight:      Height:        Intake/Output Summary (Last 24 hours) at 08/15/2020 1117 Last data filed at 08/15/2020 1048 Gross per 24 hour  Intake 115 ml  Output 2725 ml  Net -2610 ml   Filed Weights   08/13/20 0504 08/13/20 0751 08/14/20 0550  Weight: 98 kg 98 kg 94.5 kg    Physical Exam:  General exam: awake, alert, no acute distress Respiratory system: symmetric chest rise, normal respiratory effort, on room air. Cardiovascular system: RRR, no RLE edema, LLE edema due to cellulitis.   Central nervous system: A&O x3. no gross focal neurologic deficits, normal speech Extremities: left foot wound vac applied, erythematous and edematous to lower calf, improving Psychiatry: normal mood, congruent affect, judgement and insight appear normal  Labs   Data Reviewed: I have personally reviewed following labs and imaging studies  CBC: Recent Labs  Lab 08/09/20 1551 08/10/20 0503 08/11/20 0709 08/12/20 0525 08/13/20 0715 08/14/20 0426 08/15/20 0541  WBC 14.6*   < > 8.9 8.6 5.6 5.4 5.4  NEUTROABS 13.3*  --   --   --   --   --   --   HGB 12.7*   < > 10.4* 10.0* 10.0* 9.9* 10.9*  HCT 35.2*   < > 29.0* 28.5* 28.6* 28.1* 30.9*  MCV 101.7*   < > 104.7* 104.0* 103.6* 103.7* 102.7*  PLT 263   < > 204 226 247 270 293   < > = values in this interval not displayed.   Basic Metabolic Panel: Recent Labs  Lab 08/09/20 1551 08/09/20 2224  08/11/20 0709 08/12/20 0525 08/13/20 0715 08/14/20 0426 08/15/20 0541  NA 125*   < > 130* 129* 132* 133* 134*  K 2.7*   < > 3.6 3.5 3.2* 3.7 3.5  CL 86*   < > 98 97* 99 100 99  CO2 26   < > 22 24 24 25 26   GLUCOSE 122*   < > 87 85 95 96 92  BUN 18   < > 23 18 13 15 12   CREATININE 0.83   < > 1.03 0.89 0.83 0.81 0.77  CALCIUM 9.7   < > 9.1 9.2 9.2 9.0 9.5  MG 1.5*  --  1.8  --  1.4* 1.7 1.6*   < > = values in this interval not displayed.   GFR: Estimated Creatinine Clearance: 85 mL/min (by C-G formula based on SCr of 0.77 mg/dL). Liver Function Tests: Recent Labs  Lab 08/09/20 1551  AST 17  ALT 13  ALKPHOS 61  BILITOT 1.1  PROT 8.4*  ALBUMIN 4.2   No results for input(s): LIPASE, AMYLASE in the last 168 hours. No results for input(s): AMMONIA in the last 168 hours. Coagulation Profile: No results for input(s): INR, PROTIME in the last 168 hours. Cardiac Enzymes: Recent Labs  Lab 08/14/20 0426  CKTOTAL 23*   BNP (last 3 results) No results for input(s): PROBNP in the last 8760 hours. HbA1C: No results for input(s): HGBA1C in the last 72 hours. CBG: Recent Labs  Lab 08/12/20 0928  GLUCAP 121*   Lipid Profile: No results for input(s): CHOL, HDL, LDLCALC, TRIG, CHOLHDL, LDLDIRECT in the last 72 hours. Thyroid Function Tests: No results for input(s): TSH, T4TOTAL, FREET4, T3FREE, THYROIDAB in the last 72 hours. Anemia Panel:  No results for input(s): VITAMINB12, FOLATE, FERRITIN, TIBC, IRON, RETICCTPCT in the last 72 hours. Sepsis Labs: Recent Labs  Lab 08/09/20 1549 08/09/20 1804  LATICACIDVEN 1.1 1.0    Recent Results (from the past 240 hour(s))  Blood culture (routine x 2)     Status: Abnormal   Collection Time: 08/09/20  3:49 PM   Specimen: BLOOD  Result Value Ref Range Status   Specimen Description   Final    BLOOD BLOOD LEFT FOREARM Performed at [facility], [address], [place], [place] [address]    Special Requests   Final     BOTTLES DRAWN AEROBIC AND ANAEROBIC Blood Culture adequate volume Performed at [facility], [address] [place], [place] [address]    Culture  Setup Time   Final    GRAM POSITIVE COCCI AEROBIC BOTTLE ONLY CRITICAL RESULT CALLED TO, READ BACK BY AND VERIFIED WITH: NATHAN BELUE ON 08/10/20 AT 1761 QSD Performed at [facility], [place] [address], [place], [place] [address]    Culture METHICILLIN RESISTANT STAPHYLOCOCCUS AUREUS (A)  Final   Report Status 08/12/2020 FINAL  Final   Organism ID, Bacteria METHICILLIN RESISTANT STAPHYLOCOCCUS AUREUS  Final      Susceptibility   Methicillin resistant staphylococcus aureus - MIC*    CIPROFLOXACIN >=8 RESISTANT Resistant     ERYTHROMYCIN <=0.25 SENSITIVE Sensitive     GENTAMICIN <=0.5 SENSITIVE Sensitive     OXACILLIN >=4 RESISTANT Resistant     TETRACYCLINE >=16 RESISTANT Resistant     VANCOMYCIN 1 SENSITIVE Sensitive     TRIMETH/SULFA <=10 SENSITIVE Sensitive     CLINDAMYCIN <=0.25 SENSITIVE Sensitive     RIFAMPIN <=0.5 SENSITIVE Sensitive     Inducible Clindamycin NEGATIVE Sensitive     * METHICILLIN RESISTANT STAPHYLOCOCCUS AUREUS  Blood Culture ID Panel (Reflexed)     Status: Abnormal   Collection Time: 08/09/20  3:49 PM  Result Value Ref Range Status   Enterococcus faecalis NOT DETECTED NOT DETECTED Final   Enterococcus Faecium NOT DETECTED NOT DETECTED Final   Listeria monocytogenes NOT DETECTED NOT DETECTED Final   Staphylococcus species DETECTED (A) NOT DETECTED Final    Comment: CRITICAL RESULT CALLED TO, READ BACK BY AND VERIFIED WITH: NATHAN BELUE ON 08/10/20 AT 1062 QSD    Staphylococcus aureus (BCID) DETECTED (A) NOT DETECTED Final    Comment: Methicillin (oxacillin)-resistant Staphylococcus aureus (MRSA). MRSA is predictably resistant to beta-lactam antibiotics (except ceftaroline). Preferred therapy is vancomycin unless clinically contraindicated. Patient requires contact precautions if   hospitalized. CRITICAL RESULT CALLED TO, READ BACK BY AND VERIFIED WITH: NATHAN BELUE ON 08/10/20 AT 0616 QSD    Staphylococcus epidermidis NOT DETECTED NOT DETECTED Final   Staphylococcus lugdunensis NOT DETECTED NOT DETECTED Final   Streptococcus species NOT DETECTED NOT DETECTED Final   Streptococcus agalactiae NOT DETECTED NOT DETECTED Final   Streptococcus pneumoniae NOT DETECTED NOT DETECTED Final   Streptococcus pyogenes NOT DETECTED NOT DETECTED Final   A.calcoaceticus-baumannii NOT DETECTED NOT DETECTED Final   Bacteroides fragilis NOT DETECTED NOT DETECTED Final   Enterobacterales NOT DETECTED NOT DETECTED Final   Enterobacter cloacae complex NOT DETECTED NOT DETECTED Final   Escherichia coli NOT DETECTED NOT DETECTED Final   Klebsiella aerogenes NOT DETECTED NOT DETECTED Final   Klebsiella oxytoca NOT DETECTED NOT DETECTED Final   Klebsiella pneumoniae NOT DETECTED NOT DETECTED Final   Proteus species NOT DETECTED NOT DETECTED Final   Salmonella species NOT DETECTED NOT DETECTED Final   Serratia  marcescens NOT DETECTED NOT DETECTED Final   Haemophilus influenzae NOT DETECTED NOT DETECTED Final   Neisseria meningitidis NOT DETECTED NOT DETECTED Final   Pseudomonas aeruginosa NOT DETECTED NOT DETECTED Final   Stenotrophomonas maltophilia NOT DETECTED NOT DETECTED Final   Candida albicans NOT DETECTED NOT DETECTED Final   Candida auris NOT DETECTED NOT DETECTED Final   Candida glabrata NOT DETECTED NOT DETECTED Final   Candida krusei NOT DETECTED NOT DETECTED Final   Candida parapsilosis NOT DETECTED NOT DETECTED Final   Candida tropicalis NOT DETECTED NOT DETECTED Final   Cryptococcus neoformans/gattii NOT DETECTED NOT DETECTED Final   Meth resistant mecA/C and MREJ DETECTED (A) NOT DETECTED Final    Comment: CRITICAL RESULT CALLED TO, READ BACK BY AND VERIFIED WITH: NATHAN BELUE ON 08/10/20 AT 3428 QSD Performed at [facility], [address] [place],  [place] [address]   Blood culture (routine x 2)     Status: Abnormal   Collection Time: 08/09/20  3:50 PM   Specimen: BLOOD  Result Value Ref Range Status   Specimen Description   Final    BLOOD RIGHT ANTECUBITAL Performed at [facility], [address] [place], [place] [address]    Special Requests   Final    BOTTLES DRAWN AEROBIC AND ANAEROBIC Blood Culture adequate volume Performed at [facility], [address] [place], [place] [address]    Culture  Setup Time   Final    GRAM POSITIVE COCCI IN BOTH AEROBIC AND ANAEROBIC BOTTLES CRITICAL VALUE NOTED.  VALUE IS CONSISTENT WITH PREVIOUSLY REPORTED AND CALLED VALUE. Performed at [facility], [address] [place], [place] [address]    Culture (A)  Final    STAPHYLOCOCCUS AUREUS SUSCEPTIBILITIES PERFORMED ON PREVIOUS CULTURE WITHIN THE LAST 5 DAYS. Performed at [facility], [place] [address] [place], [place] [address]    Report Status 08/12/2020 FINAL  Final  Resp Panel by RT-PCR (Flu A&B, Covid) Nasopharyngeal Swab     Status: None   Collection Time: 08/09/20  4:10 PM   Specimen: Nasopharyngeal Swab; Nasopharyngeal(NP) swabs in vial transport medium  Result Value Ref Range Status   SARS Coronavirus 2 by RT PCR NEGATIVE NEGATIVE Final    Comment: (NOTE) SARS-CoV-2 target nucleic acids are NOT DETECTED.  The SARS-CoV-2 RNA is generally detectable in upper respiratory specimens during the acute phase of infection. The lowest concentration of SARS-CoV-2 viral copies this assay can detect is 138 copies/mL. A negative result does not preclude SARS-Cov-2 infection and should not be used as the sole basis for treatment or other patient management decisions. A negative result may occur with  improper specimen collection/handling, submission of specimen other than nasopharyngeal swab, presence of viral mutation(s) within the areas targeted by this assay, and inadequate number of viral copies(<138  copies/mL). A negative result must be combined with clinical observations, patient history, and epidemiological information. The expected result is Negative.  Fact Sheet for Patients:  EntrepreneurPulse.com.au  Fact Sheet for Healthcare Providers:  IncredibleEmployment.be  This test is no t yet approved or cleared by the [place] FDA and  has been authorized for detection and/or diagnosis of SARS-CoV-2 by FDA under an Emergency Use Authorization (EUA). This EUA will remain  in effect (meaning this test can be used) for the duration of the COVID-19 declaration under Section 564(b)(1) of the Act, 21 U.S.C.section 360bbb-3(b)(1), unless the authorization is terminated  or revoked sooner.       Influenza A by  PCR NEGATIVE NEGATIVE Final   Influenza B by PCR NEGATIVE NEGATIVE Final    Comment: (NOTE) The Xpert Xpress SARS-CoV-2/FLU/RSV plus assay is intended as an aid in the diagnosis of influenza from Nasopharyngeal swab specimens and should not be used as a sole basis for treatment. Nasal washings and aspirates are unacceptable for Xpert Xpress SARS-CoV-2/FLU/RSV testing.  Fact Sheet for Patients: EntrepreneurPulse.com.au  Fact Sheet for Healthcare Providers: IncredibleEmployment.be  This test is not yet approved or cleared by the [place] FDA and has been authorized for detection and/or diagnosis of SARS-CoV-2 by FDA under an Emergency Use Authorization (EUA). This EUA will remain in effect (meaning this test can be used) for the duration of the COVID-19 declaration under Section 564(b)(1) of the Act, 21 U.S.C. section 360bbb-3(b)(1), unless the authorization is terminated or revoked.  Performed at [facility], [address] [place], [place] [address]   MRSA PCR Screening     Status: Abnormal   Collection Time: 08/10/20 10:50 AM   Specimen: Nasal Mucosa; Nasopharyngeal  Result  Value Ref Range Status   MRSA by PCR POSITIVE (A) NEGATIVE Final    Comment:        The GeneXpert MRSA Assay (FDA approved for NASAL specimens only), is one component of a comprehensive MRSA colonization surveillance program. It is not intended to diagnose MRSA infection nor to guide or monitor treatment for MRSA infections. RESULT CALLED TO, READ BACK BY AND VERIFIED WITH: Jonnie Kind 08/10/20 AT 1257 BY ACR Performed at [facility], [address] [place], [place] [address]   CULTURE, BLOOD (ROUTINE X 2) w Reflex to ID Panel     Status: None (Preliminary result)   Collection Time: 08/10/20 11:55 PM   Specimen: BLOOD  Result Value Ref Range Status   Specimen Description BLOOD BLOOD LEFT HAND  Final   Special Requests   Final    BOTTLES DRAWN AEROBIC AND ANAEROBIC Blood Culture adequate volume   Culture   Final    NO GROWTH 4 DAYS Performed at [facility], [address], [place], [place] [address]    Report Status PENDING  Incomplete  CULTURE, BLOOD (ROUTINE X 2) w Reflex to ID Panel     Status: None (Preliminary result)   Collection Time: 08/10/20 11:55 PM   Specimen: BLOOD  Result Value Ref Range Status   Specimen Description BLOOD BLOOD RIGHT HAND  Final   Special Requests   Final    BOTTLES DRAWN AEROBIC AND ANAEROBIC Blood Culture adequate volume   Culture   Final    NO GROWTH 4 DAYS Performed at [facility], [address], [place], [place] [address]    Report Status PENDING  Incomplete  Aerobic/Anaerobic Culture w Gram Stain (surgical/deep wound)     Status: None (Preliminary result)   Collection Time: 08/11/20  4:40 PM   Specimen: Wound; Tissue  Result Value Ref Range Status   Specimen Description   Final    WOUND Performed at [facility], [address] [place], [place] [address]    Special Requests   Final    LEFT FOOT Performed at [facility], [address] [place], [place] [address]    Gram  Stain   Final    FEW WBC PRESENT,BOTH PMN AND MONONUCLEAR GRAM POSITIVE COCCI Performed at [facility], [place] [address] [place], [place] [address]    Culture   Final    ABUNDANT METHICILLIN RESISTANT STAPHYLOCOCCUS AUREUS NO  ANAEROBES ISOLATED; CULTURE IN PROGRESS FOR 5 DAYS    Report Status PENDING  Incomplete   Organism ID, Bacteria METHICILLIN RESISTANT STAPHYLOCOCCUS AUREUS  Final      Susceptibility   Methicillin resistant staphylococcus aureus - MIC*    CIPROFLOXACIN >=8 RESISTANT Resistant     ERYTHROMYCIN <=0.25 SENSITIVE Sensitive     GENTAMICIN <=0.5 SENSITIVE Sensitive     OXACILLIN >=4 RESISTANT Resistant     TETRACYCLINE >=16 RESISTANT Resistant     VANCOMYCIN <=0.5 SENSITIVE Sensitive     TRIMETH/SULFA <=10 SENSITIVE Sensitive     CLINDAMYCIN <=0.25 SENSITIVE Sensitive     RIFAMPIN <=0.5 SENSITIVE Sensitive     Inducible Clindamycin NEGATIVE Sensitive     * ABUNDANT METHICILLIN RESISTANT STAPHYLOCOCCUS AUREUS  Aerobic/Anaerobic Culture w Gram Stain (surgical/deep wound)     Status: None (Preliminary result)   Collection Time: 08/12/20  1:38 PM   Specimen: Wound  Result Value Ref Range Status   Specimen Description   Final    WOUND Performed at [facility], [address], [place], [place] [address]    Special Requests   Final    NONE Performed at [facility], [address] [place], [place] [address]    Gram Stain   Final    NO WBC SEEN RARE GRAM POSITIVE COCCI IN CLUSTERS Performed at [facility], [place] [address] [place], [place] [address]    Culture   Final    FEW STAPHYLOCOCCUS AUREUS SUSCEPTIBILITIES TO FOLLOW NO ANAEROBES ISOLATED; CULTURE IN PROGRESS FOR 5 DAYS    Report Status PENDING  Incomplete      Imaging Studies   [place] EKG SITE RITE  Result Date: 08/13/2020 If Site Rite image not attached, placement could not be confirmed due to current cardiac rhythm.    Medications   Scheduled Meds: . amLODipine   2.5 mg Oral Daily  . aspirin EC  81 mg Oral Daily  . azelastine  1 spray Each Nare BID  . B-complex with vitamin C  1 tablet Oral Daily  . calcium citrate-vitamin D  1 tablet Oral Daily  . Chlorhexidine Gluconate Cloth  6 each Topical Q0600  . enoxaparin (LOVENOX) injection  40 mg Subcutaneous Q24H  . folic acid  1 mg Oral Daily  . furosemide  40 mg Oral Daily  . gabapentin  1,200 mg Oral BID  . melatonin  5 mg Oral QHS  . pantoprazole  40 mg Oral Daily  . potassium chloride  40 mEq Oral Daily  . pravastatin  80 mg Oral Daily  . primidone  50 mg Oral TID  . sodium chloride flush  10-40 mL Intracatheter Q12H  . tretinoin  1 application Topical QHS  . triamcinolone  1 application Topical BID  . cyanocobalamin  1,000 mcg Oral Daily   Continuous Infusions: . sodium chloride Stopped (08/10/20 1753)  . DAPTOmycin (CUBICIN)  IV Stopped (08/14/20 1515)  . magnesium sulfate bolus IVPB         LOS: 5 days    Time spent: 45 min    Desma Maxim, MD [facility]  08/15/2020, 11:17 AM      If 7PM-7AM, please contact night-coverage. How to contact the [facility] Attending or Consulting provider [place] or covering provider during after hours [place], for this patient?    1. Check the care team in [facility] and look for a) attending/consulting [facility] provider listed and b) the [facility] team listed 2. Log  into [email or web address] and use [place]'s universal password to access. If you do not have the password, please contact the hospital operator. 3. Locate the [facility] provider you are looking for under [facility] and page to a number that you can be directly reached. 4. If you still have difficulty reaching the provider, please page the [facility] (Director on Call) for the Hospitalists listed on amion for assistance.

## 2020-08-15 NOTE — NC FL2 (Signed)
[place] LEVEL OF CARE SCREENING TOOL     IDENTIFICATION  Patient Name: [name] Birthdate: Oct 11, 1935 Sex: male Admission Date (Current Location): 08/09/2020  [place] and [place] Number:  Engineering geologist and Address:  [facility], [address], [place], [place] [address]      Provider Number: [id number]  Attending Physician Name and Address:  Gwynne Edinger, MD  Relative Name and Phone Number:  Azarion Hove [phone]    Current Level of Care: Hospital Recommended Level of Care: [place] Prior Approval Number:    Date Approved/Denied:   PASRR Number: [id number]  Discharge Plan: SNF    Current Diagnoses: Patient Active Problem List   Diagnosis Date Noted  . MRSA bacteremia 08/10/2020  . Sepsis ([place]) 04/24/2020  . Cellulitis of knee, left 04/22/2020  . Hypokalemia 04/22/2020  . Osteoarthritis of multiple joints 12/15/2017  . Hyponatremia 03/28/2017  . Abnormality of gait and mobility 01/11/2017  . Cloudy vision 01/11/2017  . Foot drop, bilateral 01/11/2017  . Amputee, great toe, left ([place]) 01/11/2017  . 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 Macrocytic anemia 09/22/2015  . Moderate tricuspid insufficiency 04/21/2015  . History of prostate cancer 04/16/2015  . Idiopathic peripheral neuropathy 04/16/2015  . Essential hypertension 04/16/2015  . GERD (gastroesophageal reflux disease) 04/16/2015  . Arthritis 04/16/2015  . Bilateral lower extremity edema 04/16/2015  . Benign essential tremor 10/30/2014  . Hyperlipidemia, mixed 10/27/2014  . Chronic systolic CHF (congestive heart failure), NYHA class 2 ([place]) 04/23/2014  . Mitral insufficiency 11/05/2013  . CAD (coronary artery disease) 11/01/2013  . Basal cell carcinoma of skin of other parts of face 03/14/2013  . Ankle sprain 09/09/2011  . History of fibula fracture 09/09/2011    Orientation RESPIRATION BLADDER Height &  Weight     Self,Time  Normal   Weight: 94.5 kg Height:  6' (182.9 cm)  BEHAVIORAL SYMPTOMS/MOOD NEUROLOGICAL BOWEL NUTRITION STATUS  [place]     Diet  AMBULATORY STATUS COMMUNICATION OF NEEDS Skin   Extensive Assist Verbally Wound Vac                       Personal Care Assistance Level of Assistance  Bathing,Feeding,Dressing Bathing Assistance: Limited assistance Feeding assistance: Limited assistance Dressing Assistance: Limited assistance     Functional Limitations Info  Sight,Hearing,Speech Sight Info: Adequate Hearing Info: Adequate Speech Info: Adequate    SPECIAL CARE FACTORS FREQUENCY  PT (By licensed PT),OT (By licensed OT)     PT Frequency: 5X week OT Frequency: 5x week            Contractures Contractures Info: Not present    Additional Factors Info  Code Status,Allergies Code Status Info: Full Allergies Info: Codein, Indocin, Mupirocin, Plavix, Tape, Polysporin, Shellfish, Neosporin, Bacitracin           Current Medications (08/15/2020):  This is the current hospital active medication list Current Facility-Administered Medications  Medication Dose Route Frequency Provider Last Rate Last Admin  . 0.9 %  sodium chloride infusion   Intravenous PRN Samara Deist, DPM   Stopped at 08/10/20 1753  . amLODipine (NORVASC) tablet 2.5 mg  2.5 mg Oral Daily Samara Deist, DPM   2.5 mg at 08/14/20 0932  . aspirin EC tablet 81 mg  81 mg Oral Daily Samara Deist, DPM   81 mg at 08/14/20 0932  . azelastine (ASTELIN) 0.1 % nasal spray 1 spray  1 spray Each  Nare BID Samara Deist, DPM   1 spray at 08/14/20 2100  . B-complex with vitamin C tablet 1 tablet  1 tablet Oral Daily Samara Deist, DPM   1 tablet at 08/14/20 0932  . calcium citrate-vitamin D 500-500 MG-UNIT per chewable tablet 1 tablet  1 tablet Oral Daily Samara Deist, DPM   1 tablet at 08/14/20 0933  . Chlorhexidine Gluconate Cloth 2 % PADS 6 each  6 each Topical Q0600 Samara Deist, DPM   6 each  at 08/15/20 [phone]  . DAPTOmycin (CUBICIN) 750 mg in sodium chloride 0.9 % IVPB  750 mg Intravenous Q2000 Tsosie Billing, MD   Stopped at 08/14/20 1515  . diphenhydrAMINE (BENADRYL) capsule 25 mg  25 mg Oral QHS PRN Samara Deist, DPM   25 mg at 08/10/20 2116  . enoxaparin (LOVENOX) injection 40 mg  40 mg Subcutaneous Q24H Samara Deist, DPM   40 mg at 08/14/20 2056  . folic acid (FOLVITE) tablet 1 mg  1 mg Oral Daily Samara Deist, DPM   1 mg at 08/14/20 0932  . furosemide (LASIX) tablet 40 mg  40 mg Oral Daily Samara Deist, DPM   40 mg at 08/14/20 0932  . gabapentin (NEURONTIN) tablet 1,200 mg  1,200 mg Oral BID Samara Deist, DPM   1,200 mg at 08/14/20 2100  . melatonin tablet 5 mg  5 mg Oral QHS Samara Deist, DPM   5 mg at 08/14/20 2100  . ondansetron (ZOFRAN) tablet 4 mg  4 mg Oral Q6H PRN Samara Deist, DPM       Or  . ondansetron [facility]) injection 4 mg  4 mg Intravenous Q6H PRN Samara Deist, DPM      . pantoprazole (PROTONIX) EC tablet 40 mg  40 mg Oral Daily Samara Deist, DPM   40 mg at 08/14/20 0931  . potassium chloride SA (KLOR-CON) CR tablet 40 mEq  40 mEq Oral Daily Gwynne Edinger, MD   40 mEq at 08/14/20 0932  . pravastatin (PRAVACHOL) tablet 80 mg  80 mg Oral Daily Samara Deist, DPM   80 mg at 08/14/20 0932  . primidone (MYSOLINE) tablet 50 mg  50 mg Oral TID Samara Deist, DPM   50 mg at 08/14/20 2104  . sodium chloride flush (NS) 0.9 % injection 10-40 mL  10-40 mL Intracatheter Q12H Wouk, Ailene Rud, MD   10 mL at 08/14/20 2100  . sodium chloride flush (NS) 0.9 % injection 10-40 mL  10-40 mL Intracatheter PRN Wouk, Ailene Rud, MD      . traMADol [facility]at[facility]) tablet 50 mg  50 mg Oral Q6H PRN Samara Deist, DPM   50 mg at 08/14/20 2100  . tretinoin (RETIN-A) 3.08 % cream 1 application  1 application Topical QHS Samara Deist, DPM      . triamcinolone (KENALOG) 0.1 % cream 1 application  1 application Topical BID Samara Deist, DPM   1 application at  65/78/46 2101  . vitamin B-12 (CYANOCOBALAMIN) tablet 1,000 mcg  1,000 mcg Oral Daily Samara Deist, DPM   1,000 mcg at 08/14/20 9629     Discharge Medications: Please see discharge summary for a list of discharge medications.  Relevant Imaging Results:  Relevant Lab Results:   Additional Information    Kerin Salen, RN

## 2020-08-16 DIAGNOSIS — R7881 Bacteremia: Secondary | ICD-10-CM | POA: Diagnosis not present

## 2020-08-16 DIAGNOSIS — B9562 Methicillin resistant Staphylococcus aureus infection as the cause of diseases classified elsewhere: Secondary | ICD-10-CM | POA: Diagnosis not present

## 2020-08-16 LAB — BASIC METABOLIC PANEL
Anion gap: 8 (ref 5–15)
BUN: 14 mg/dL (ref 8–23)
CO2: 27 mmol/L (ref 22–32)
Calcium: 9.4 mg/dL (ref 8.9–10.3)
Chloride: 99 mmol/L (ref 98–111)
Creatinine, Ser: 0.77 mg/dL (ref 0.61–1.24)
GFR, Estimated: >60 mL/min (ref >60)
Glucose, Bld: 104 mg/dL — ABNORMAL HIGH (ref 70–99)
Potassium: 3.9 mmol/L (ref 3.5–5.1)
Sodium: 134 mmol/L — ABNORMAL LOW (ref 135–145)

## 2020-08-16 LAB — CULTURE, BLOOD (ROUTINE X 2)
Culture: NO GROWTH
Culture: NO GROWTH
Special Requests: ADEQUATE
Special Requests: ADEQUATE

## 2020-08-16 LAB — CBC
HCT: 30.8 % — ABNORMAL LOW (ref 39.0–52.0)
Hemoglobin: 11 g/dL — ABNORMAL LOW (ref 13.0–17.0)
MCH: 36.8 pg — ABNORMAL HIGH (ref 26.0–34.0)
MCHC: 35.7 g/dL (ref 30.0–36.0)
MCV: 103 fL — ABNORMAL HIGH (ref 80.0–100.0)
Platelets: 310 10*3/uL (ref 150–400)
RBC: 2.99 MIL/uL — ABNORMAL LOW (ref 4.22–5.81)
RDW: 12.2 % (ref 11.5–15.5)
WBC: 6.7 10*3/uL (ref 4.0–10.5)
nRBC: 0 % (ref 0.0–0.2)

## 2020-08-16 LAB — AEROBIC/ANAEROBIC CULTURE W GRAM STAIN (SURGICAL/DEEP WOUND)

## 2020-08-16 LAB — MAGNESIUM: Magnesium: 1.7 mg/dL (ref 1.7–2.4)

## 2020-08-16 MED ORDER — CHLORHEXIDINE GLUCONATE CLOTH 2 % EX PADS
6.0000 | MEDICATED_PAD | Freq: Every day | CUTANEOUS | Status: DC
  Administered 2020-08-17 – 2020-08-18 (×2): 6 via TOPICAL

## 2020-08-16 MED ORDER — MAGNESIUM SULFATE IN D5W 1-5 GM/100ML-% IV SOLN
1.0000 g | Freq: Once | INTRAVENOUS | Status: AC
  Administered 2020-08-16: 1 g via INTRAVENOUS
  Filled 2020-08-16: qty 100

## 2020-08-16 NOTE — Progress Notes (Signed)
PROGRESS NOTE    [name]   YYT:[id number]  DOB: Sep 19, 1935  PCP: [name], DO    DOA: 08/09/2020 LOS: 6   Brief Narrative   Summary of HPI on admission: 85 y.o. male with medical history significant for idiopathic peripheral neuropathy, hypertension, hyperlipidemia, GERD, presented to the ED on 08/09/2020 for chief concerns of worsening left leg pain since 2/24.  No preceding trauma.  No fevers or chills.  ED course: Temp 100.66F, tachycardic HR 106, respirations 23.  Labs notable for hyponatremia 125, hypokalemia 2.7, hypochloremia 86, hypomagnesemia 1.5, leukocytosis 14.6, mild anemia hemoglobin 12.7 macrocytic.  Negative COVID-19 and influenza's on PCR.  UA with moderate hemoglobin but negative for infection.  Blood cultures were drawn, returned positive for MRSA.  Hospital course to date: Admitted to hospitalist service for further evaluation and management of sepsis due to left foot cellulitis and multiple electrolyte derangements.  Left lower extremity venous US negative for DVT. 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 MRI left ankle negative for signs of abscess or osteomyelitis, but showed diffuse cellulitis of the ankle and hindfoot, sprain and partial tear of the ATFL and small joint effusion.  On broad-spectrum antibiotic coverage with vancomycin and cefepime.  Infectious disease consulted.   Assessment & Plan   Active Problems:   Sepsis ([place])   MRSA bacteremia    Sepsis secondary to MRSA bacteremia and Left Foot Cellulitis with abscess Sepsis POA as evidenced by leukocytosis, tachycardia, tachypnea.  Sepsis resolved. S/p I and D by podiatry on 3/2. TTE/TEE w/o signs vegetations. Picc placed 3/3. Infection improving. Podiatry has seen, ok to discharge. - continue wound vac, change m/w/f - ID following, transitioned to daptomycin 3/4 - pt has changed mind  and wants snf placement, care mgmt working on that  Hypokalemia - suspect secondary to diuretics Resolved - continue daily potassium - Mg 1 g today for mg of 1.7  Hyponatremia - POA, chronic. Likely 2/2 thiazide as improving off thiazide - hold hctz, monitor  Chronic systolic CHF / CAD / MR / TR - all appear stable.  Monitor volume status, renal function, electrolytes --Continue ASA --cont home lasix  Hypotension POA / Hx of Hypertension -resumed amlodipine 2.5 mg, lasix -Held: home HCTZ -Hydralazine PRN  Hyperlipidemia -pravastatin 80 mg daily  Idiopathic peripheral neuropathy -resumed gabapentin 1200 mg twice daily p.o.  Insomnia-melatonin 5 mg daily at bedtime  GERD -PPI   DVT prophylaxis: enoxaparin (LOVENOX) injection 40 mg Start: 08/09/20 2000 Place TED hose Start: 08/09/20 1758   Diet:  Diet Orders (From admission, onward)    Start     Ordered   08/14/20 0000  Diet - low sodium heart healthy        08/14/20 1138   08/13/20 0923  Diet regular Room service appropriate? Yes; Fluid consistency: Thin  Diet effective now       Question Answer Comment  Room service appropriate? Yes   Fluid consistency: Thin      08/13/20 0922            Code Status: Full Code    Subjective 08/16/20    Feeling well, no pain, has appetite, no n/v/d.   Disposition Plan & Communication   Status is: Inpatient  Inpatient status appropriate due to severity of illness on IV antibiotics for MRSA bacteremia.  Dispo: The patient is from: Home  Anticipated d/c is to: SNF              Patient currently is medically stable for d/c.   Difficult to place patient No   Family Communication: wife updated bedside 3/4   Consults, Procedures, Significant Events   Consultants:   Infectious Disease  Podiatry  Procedures:   None  Antimicrobials:  Anti-infectives (From admission, onward)   Start     Dose/Rate Route Frequency Ordered Stop   08/15/20 0000   daptomycin (CUBICIN) IVPB        750 mg Intravenous Every 24 hours 08/14/20 1407 08/29/20 2359   08/14/20 1430  DAPTOmycin (CUBICIN) 750 mg in sodium chloride 0.9 % IVPB        750 mg 230 mL/hr over 30 Minutes Intravenous Daily 08/14/20 1224     08/14/20 0000  linezolid (ZYVOX) 600 MG tablet  Status:  Discontinued        600 mg Oral Every 12 hours 08/14/20 1138 08/14/20    08/13/20 1800  linezolid (ZYVOX) tablet 600 mg  Status:  Discontinued        600 mg Oral Every 12 hours 08/13/20 1653 08/14/20 1613   08/13/20 1000  vancomycin (VANCOREADY) IVPB 1750 mg/350 mL  Status:  Discontinued        1,750 mg 175 mL/hr over 120 Minutes Intravenous Every 24 hours 08/12/20 1446 08/14/20 1224   08/12/20 1157  vancomycin (VANCOCIN) 1-5 GM/200ML-% IVPB       Note to Pharmacy: Norton Blizzard  : cabinet override      08/12/20 1157 08/12/20 2359   08/10/20 1100  vancomycin (VANCOREADY) IVPB 1000 mg/200 mL  Status:  Discontinued        1,000 mg 200 mL/hr over 60 Minutes Intravenous Every 12 hours 08/09/20 1905 08/12/20 1254   08/09/20 2345  ceFEPIme (MAXIPIME) 2 g in sodium chloride 0.9 % 100 mL IVPB  Status:  Discontinued        2 g 200 mL/hr over 30 Minutes Intravenous Every 8 hours 08/09/20 1852 08/09/20 1854   08/09/20 2345  ceFEPIme (MAXIPIME) 2 g in sodium chloride 0.9 % 100 mL IVPB  Status:  Discontinued        2 g 200 mL/hr over 30 Minutes Intravenous Every 8 hours 08/09/20 1854 08/13/20 1327   08/09/20 1930  vancomycin (VANCOCIN) IVPB 1000 mg/200 mL premix  Status:  Discontinued        1,000 mg 200 mL/hr over 60 Minutes Intravenous  Once 08/09/20 1852 08/09/20 1854   08/09/20 1930  vancomycin (VANCOCIN) IVPB 1000 mg/200 mL premix        1,000 mg 200 mL/hr over 60 Minutes Intravenous  Once 08/09/20 1854 08/10/20 0020   08/09/20 1600  ceFEPIme (MAXIPIME) 2 g in sodium chloride 0.9 % 100 mL IVPB        2 g 200 mL/hr over 30 Minutes Intravenous  Once 08/09/20 1555 08/09/20 1636   08/09/20  1600  metroNIDAZOLE (FLAGYL) IVPB 500 mg        500 mg 100 mL/hr over 60 Minutes Intravenous  Once 08/09/20 1555 08/09/20 1750   08/09/20 1600  vancomycin (VANCOCIN) IVPB 1000 mg/200 mL premix        1,000 mg 200 mL/hr over 60 Minutes Intravenous  Once 08/09/20 1555 08/09/20 1926        Micro    Objective   Vitals:   08/15/20 1959 08/16/20 0100 08/16/20 0453 08/16/20 0806  BP: 124/78  111/66 132/80  Pulse: 87  80 75  Resp: 18  18 18   Temp: 98.3 F (36.8 C)  98.2 F (36.8 C) 97.7 F (36.5 C)  TempSrc: Oral   Oral  SpO2: 97%  96% 97%  Weight:  91.5 kg    Height:        Intake/Output Summary (Last 24 hours) at 08/16/2020 1136 Last data filed at 08/16/2020 0500 Gross per 24 hour  Intake 240 ml  Output 1075 ml  Net -835 ml   Filed Weights   08/13/20 0751 08/14/20 0550 08/16/20 0100  Weight: 98 kg 94.5 kg 91.5 kg    Physical Exam:  General exam: awake, alert, no acute distress Respiratory system: symmetric chest rise, normal respiratory effort, on room air. Cardiovascular system: RRR, no RLE edema, LLE edema due to cellulitis.   Central nervous system: A&O x3. no gross focal neurologic deficits, normal speech Extremities: left foot wound vac applied, erythematous and edematous to lower calf, improving Psychiatry: normal mood, congruent affect, judgement and insight appear normal  Labs   Data Reviewed: I have personally reviewed following labs and imaging studies  CBC: Recent Labs  Lab 08/09/20 1551 08/10/20 0503 08/12/20 0525 08/13/20 0715 08/14/20 0426 08/15/20 0541 08/16/20 0600  WBC 14.6*   < > 8.6 5.6 5.4 5.4 6.7  NEUTROABS 13.3*  --   --   --   --   --   --   HGB 12.7*   < > 10.0* 10.0* 9.9* 10.9* 11.0*  HCT 35.2*   < > 28.5* 28.6* 28.1* 30.9* 30.8*  MCV 101.7*   < > 104.0* 103.6* 103.7* 102.7* 103.0*  PLT 263   < > 226 247 270 293 310   < > = values in this interval not displayed.   Basic Metabolic Panel: Recent Labs  Lab 08/11/20 0709  08/12/20 0525 08/13/20 0715 08/14/20 0426 08/15/20 0541 08/16/20 0600  NA 130* 129* 132* 133* 134* 134*  K 3.6 3.5 3.2* 3.7 3.5 3.9  CL 98 97* 99 100 99 99  CO2 22 24 24 25 26 27   GLUCOSE 87 85 95 96 92 104*  BUN 23 18 13 15 12 14   CREATININE 1.03 0.89 0.83 0.81 0.77 0.77  CALCIUM 9.1 9.2 9.2 9.0 9.5 9.4  MG 1.8  --  1.4* 1.7 1.6* 1.7   GFR: Estimated Creatinine Clearance: 78.1 mL/min (by C-G formula based on SCr of 0.77 mg/dL). Liver Function Tests: Recent Labs  Lab 08/09/20 1551  AST 17  ALT 13  ALKPHOS 61  BILITOT 1.1  PROT 8.4*  ALBUMIN 4.2   No results for input(s): LIPASE, AMYLASE in the last 168 hours. No results for input(s): AMMONIA in the last 168 hours. Coagulation Profile: No results for input(s): INR, PROTIME in the last 168 hours. Cardiac Enzymes: Recent Labs  Lab 08/14/20 0426  CKTOTAL 23*   BNP (last 3 results) No results for input(s): PROBNP in the last 8760 hours. HbA1C: No results for input(s): HGBA1C in the last 72 hours. CBG: Recent Labs  Lab 08/12/20 0928  GLUCAP 121*   Lipid Profile: No results for input(s): CHOL, HDL, LDLCALC, TRIG, CHOLHDL, LDLDIRECT in the last 72 hours. Thyroid Function Tests: No results for input(s): TSH, T4TOTAL, FREET4, T3FREE, THYROIDAB in the last 72 hours. Anemia Panel: No results for input(s): VITAMINB12, FOLATE, FERRITIN, TIBC, IRON, RETICCTPCT in the last 72 hours. Sepsis Labs: Recent Labs  Lab 08/09/20 1549 08/09/20 1804  LATICACIDVEN 1.1 1.0    Recent Results (from  the past 240 hour(s))  Blood culture (routine x 2)     Status: Abnormal   Collection Time: 08/09/20  3:49 PM   Specimen: BLOOD  Result Value Ref Range Status   Specimen Description   Final    BLOOD BLOOD LEFT FOREARM Performed at [facility], [address], [place], [place] [address]    Special Requests   Final    BOTTLES DRAWN AEROBIC AND ANAEROBIC Blood Culture adequate volume Performed at [facility],  [address] [place], [place] [address]    Culture  Setup Time   Final    GRAM POSITIVE COCCI AEROBIC BOTTLE ONLY CRITICAL RESULT CALLED TO, READ BACK BY AND VERIFIED WITH: NATHAN BELUE ON 08/10/20 AT 2353 QSD Performed at [facility], [place] [address] [place], [place] [address]    Culture METHICILLIN RESISTANT STAPHYLOCOCCUS AUREUS (A)  Final   Report Status 08/12/2020 FINAL  Final   Organism ID, Bacteria METHICILLIN RESISTANT STAPHYLOCOCCUS AUREUS  Final      Susceptibility   Methicillin resistant staphylococcus aureus - MIC*    CIPROFLOXACIN >=8 RESISTANT Resistant     ERYTHROMYCIN <=0.25 SENSITIVE Sensitive     GENTAMICIN <=0.5 SENSITIVE Sensitive     OXACILLIN >=4 RESISTANT Resistant     TETRACYCLINE >=16 RESISTANT Resistant     VANCOMYCIN 1 SENSITIVE Sensitive     TRIMETH/SULFA <=10 SENSITIVE Sensitive     CLINDAMYCIN <=0.25 SENSITIVE Sensitive     RIFAMPIN <=0.5 SENSITIVE Sensitive     Inducible Clindamycin NEGATIVE Sensitive     * METHICILLIN RESISTANT STAPHYLOCOCCUS AUREUS  Blood Culture ID Panel (Reflexed)     Status: Abnormal   Collection Time: 08/09/20  3:49 PM  Result Value Ref Range Status   Enterococcus faecalis NOT DETECTED NOT DETECTED Final   Enterococcus Faecium NOT DETECTED NOT DETECTED Final   Listeria monocytogenes NOT DETECTED NOT DETECTED Final   Staphylococcus species DETECTED (A) NOT DETECTED Final    Comment: CRITICAL RESULT CALLED TO, READ BACK BY AND VERIFIED WITH: NATHAN BELUE ON 08/10/20 AT 1540 QSD    Staphylococcus aureus (BCID) DETECTED (A) NOT DETECTED Final    Comment: Methicillin (oxacillin)-resistant Staphylococcus aureus (MRSA). MRSA is predictably resistant to beta-lactam antibiotics (except ceftaroline). Preferred therapy is vancomycin unless clinically contraindicated. Patient requires contact precautions if  hospitalized. CRITICAL RESULT CALLED TO, READ BACK BY AND VERIFIED WITH: NATHAN BELUE ON 08/10/20 AT 0616 QSD     Staphylococcus epidermidis NOT DETECTED NOT DETECTED Final   Staphylococcus lugdunensis NOT DETECTED NOT DETECTED Final   Streptococcus species NOT DETECTED NOT DETECTED Final   Streptococcus agalactiae NOT DETECTED NOT DETECTED Final   Streptococcus pneumoniae NOT DETECTED NOT DETECTED Final   Streptococcus pyogenes NOT DETECTED NOT DETECTED Final   A.calcoaceticus-baumannii NOT DETECTED NOT DETECTED Final   Bacteroides fragilis NOT DETECTED NOT DETECTED Final   Enterobacterales NOT DETECTED NOT DETECTED Final   Enterobacter cloacae complex NOT DETECTED NOT DETECTED Final   Escherichia coli NOT DETECTED NOT DETECTED Final   Klebsiella aerogenes NOT DETECTED NOT DETECTED Final   Klebsiella oxytoca NOT DETECTED NOT DETECTED Final   Klebsiella pneumoniae NOT DETECTED NOT DETECTED Final   Proteus species NOT DETECTED NOT DETECTED Final   Salmonella species NOT DETECTED NOT DETECTED Final   Serratia marcescens NOT DETECTED NOT DETECTED Final   Haemophilus influenzae NOT DETECTED NOT DETECTED Final   Neisseria meningitidis NOT DETECTED NOT DETECTED Final   Pseudomonas aeruginosa NOT DETECTED NOT DETECTED Final  Stenotrophomonas maltophilia NOT DETECTED NOT DETECTED Final   Candida albicans NOT DETECTED NOT DETECTED Final   Candida auris NOT DETECTED NOT DETECTED Final   Candida glabrata NOT DETECTED NOT DETECTED Final   Candida krusei NOT DETECTED NOT DETECTED Final   Candida parapsilosis NOT DETECTED NOT DETECTED Final   Candida tropicalis NOT DETECTED NOT DETECTED Final   Cryptococcus neoformans/gattii NOT DETECTED NOT DETECTED Final   Meth resistant mecA/C and MREJ DETECTED (A) NOT DETECTED Final    Comment: CRITICAL RESULT CALLED TO, READ BACK BY AND VERIFIED WITH: NATHAN BELUE ON 08/10/20 AT 3614 QSD Performed at [facility], [address] [place], [place] [address]   Blood culture (routine x 2)     Status: Abnormal   Collection Time: 08/09/20  3:50 PM   Specimen:  BLOOD  Result Value Ref Range Status   Specimen Description   Final    BLOOD RIGHT ANTECUBITAL Performed at [facility], [address], [place], [place] [address]    Special Requests   Final    BOTTLES DRAWN AEROBIC AND ANAEROBIC Blood Culture adequate volume Performed at [facility], [address] [place], [place] [address]    Culture  Setup Time   Final    GRAM POSITIVE COCCI IN BOTH AEROBIC AND ANAEROBIC BOTTLES CRITICAL VALUE NOTED.  VALUE IS CONSISTENT WITH PREVIOUSLY REPORTED AND CALLED VALUE. Performed at [facility], [address] [place], [place] [address]    Culture (A)  Final    STAPHYLOCOCCUS AUREUS SUSCEPTIBILITIES PERFORMED ON PREVIOUS CULTURE WITHIN THE LAST 5 DAYS. Performed at [facility], [place] [address], [place], [place] [address]    Report Status 08/12/2020 FINAL  Final  Resp Panel by RT-PCR (Flu A&B, Covid) Nasopharyngeal Swab     Status: None   Collection Time: 08/09/20  4:10 PM   Specimen: Nasopharyngeal Swab; Nasopharyngeal(NP) swabs in vial transport medium  Result Value Ref Range Status   SARS Coronavirus 2 by RT PCR NEGATIVE NEGATIVE Final    Comment: (NOTE) SARS-CoV-2 target nucleic acids are NOT DETECTED.  The SARS-CoV-2 RNA is generally detectable in upper respiratory specimens during the acute phase of infection. The lowest concentration of SARS-CoV-2 viral copies this assay can detect is 138 copies/mL. A negative result does not preclude SARS-Cov-2 infection and should not be used as the sole basis for treatment or other patient management decisions. A negative result may occur with  improper specimen collection/handling, submission of specimen other than nasopharyngeal swab, presence of viral mutation(s) within the areas targeted by this assay, and inadequate number of viral copies(<138 copies/mL). A negative result must be combined with clinical observations, patient history, and  epidemiological information. The expected result is Negative.  Fact Sheet for Patients:  EntrepreneurPulse.com.au  Fact Sheet for Healthcare Providers:  IncredibleEmployment.be  This test is no t yet approved or cleared by the [place] FDA and  has been authorized for detection and/or diagnosis of SARS-CoV-2 by FDA under an Emergency Use Authorization (EUA). This EUA will remain  in effect (meaning this test can be used) for the duration of the COVID-19 declaration under Section 564(b)(1) of the Act, 21 U.S.C.section 360bbb-3(b)(1), unless the authorization is terminated  or revoked sooner.       Influenza A by PCR NEGATIVE NEGATIVE Final   Influenza B by PCR NEGATIVE NEGATIVE Final    Comment: (NOTE) The Xpert Xpress SARS-CoV-2/FLU/RSV plus assay is intended as an aid in the diagnosis of influenza from  Nasopharyngeal swab specimens and should not be used as a sole basis for treatment. Nasal washings and aspirates are unacceptable for Xpert Xpress SARS-CoV-2/FLU/RSV testing.  Fact Sheet for Patients: EntrepreneurPulse.com.au  Fact Sheet for Healthcare Providers: IncredibleEmployment.be  This test is not yet approved or cleared by the [place] FDA and has been authorized for detection and/or diagnosis of SARS-CoV-2 by FDA under an Emergency Use Authorization (EUA). This EUA will remain in effect (meaning this test can be used) for the duration of the COVID-19 declaration under Section 564(b)(1) of the Act, 21 U.S.C. section 360bbb-3(b)(1), unless the authorization is terminated or revoked.  Performed at [facility], [address] [place], [place] [address]   MRSA PCR Screening     Status: Abnormal   Collection Time: 08/10/20 10:50 AM   Specimen: Nasal Mucosa; Nasopharyngeal  Result Value Ref Range Status   MRSA by PCR POSITIVE (A) NEGATIVE Final    Comment:        The  GeneXpert MRSA Assay (FDA approved for NASAL specimens only), is one component of a comprehensive MRSA colonization surveillance program. It is not intended to diagnose MRSA infection nor to guide or monitor treatment for MRSA infections. RESULT CALLED TO, READ BACK BY AND VERIFIED WITH: Jonnie Kind 08/10/20 AT 1257 BY ACR Performed at [facility], [address] [place], [place] [address]   CULTURE, BLOOD (ROUTINE X 2) w Reflex to ID Panel     Status: None (Preliminary result)   Collection Time: 08/10/20 11:55 PM   Specimen: BLOOD  Result Value Ref Range Status   Specimen Description BLOOD BLOOD LEFT HAND  Final   Special Requests   Final    BOTTLES DRAWN AEROBIC AND ANAEROBIC Blood Culture adequate volume   Culture   Final    NO GROWTH 4 DAYS Performed at [facility], [address] [place], [place] [address]    Report Status PENDING  Incomplete  CULTURE, BLOOD (ROUTINE X 2) w Reflex to ID Panel     Status: None (Preliminary result)   Collection Time: 08/10/20 11:55 PM   Specimen: BLOOD  Result Value Ref Range Status   Specimen Description BLOOD BLOOD RIGHT HAND  Final   Special Requests   Final    BOTTLES DRAWN AEROBIC AND ANAEROBIC Blood Culture adequate volume   Culture   Final    NO GROWTH 4 DAYS Performed at [facility], [address] [place], [place], [place] [address]    Report Status PENDING  Incomplete  Aerobic/Anaerobic Culture w Gram Stain (surgical/deep wound)     Status: None (Preliminary result)   Collection Time: 08/11/20  4:40 PM   Specimen: Wound; Tissue  Result Value Ref Range Status   Specimen Description   Final    WOUND Performed at [facility], [address], [place], [place] [address]    Special Requests   Final    LEFT FOOT Performed at [facility], [address] [place], [place] [address]    Gram Stain   Final    FEW WBC PRESENT,BOTH PMN AND MONONUCLEAR GRAM POSITIVE COCCI Performed  at [facility], [place] [address] [place], [place] [address]    Culture   Final    ABUNDANT METHICILLIN RESISTANT STAPHYLOCOCCUS AUREUS NO ANAEROBES ISOLATED; CULTURE IN PROGRESS FOR 5 DAYS    Report Status PENDING  Incomplete   Organism ID, Bacteria METHICILLIN RESISTANT STAPHYLOCOCCUS AUREUS  Final      Susceptibility  Methicillin resistant staphylococcus aureus - MIC*    CIPROFLOXACIN >=8 RESISTANT Resistant     ERYTHROMYCIN <=0.25 SENSITIVE Sensitive     GENTAMICIN <=0.5 SENSITIVE Sensitive     OXACILLIN >=4 RESISTANT Resistant     TETRACYCLINE >=16 RESISTANT Resistant     VANCOMYCIN <=0.5 SENSITIVE Sensitive     TRIMETH/SULFA <=10 SENSITIVE Sensitive     CLINDAMYCIN <=0.25 SENSITIVE Sensitive     RIFAMPIN <=0.5 SENSITIVE Sensitive     Inducible Clindamycin NEGATIVE Sensitive     * ABUNDANT METHICILLIN RESISTANT STAPHYLOCOCCUS AUREUS  Aerobic/Anaerobic Culture w Gram Stain (surgical/deep wound)     Status: None (Preliminary result)   Collection Time: 08/12/20  1:38 PM   Specimen: Wound  Result Value Ref Range Status   Specimen Description   Final    WOUND Performed at [facility], [address], [place], [place] [address]    Special Requests   Final    NONE Performed at [facility], [address] [place], [place] [address]    Gram Stain   Final    NO WBC SEEN RARE GRAM POSITIVE COCCI IN CLUSTERS Performed at [facility], [place] [address] [place],  [address]    Culture   Final    FEW METHICILLIN RESISTANT STAPHYLOCOCCUS AUREUS NO ANAEROBES ISOLATED; CULTURE IN PROGRESS FOR 5 DAYS    Report Status PENDING  Incomplete   Organism ID, Bacteria METHICILLIN RESISTANT STAPHYLOCOCCUS AUREUS  Final      Susceptibility   Methicillin resistant staphylococcus aureus - MIC*    CIPROFLOXACIN 4 RESISTANT Resistant     ERYTHROMYCIN <=0.25 SENSITIVE Sensitive     GENTAMICIN <=0.5 SENSITIVE Sensitive     OXACILLIN >=4 RESISTANT Resistant      TETRACYCLINE >=16 RESISTANT Resistant     VANCOMYCIN 1 SENSITIVE Sensitive     TRIMETH/SULFA <=10 SENSITIVE Sensitive     CLINDAMYCIN <=0.25 SENSITIVE Sensitive     RIFAMPIN <=0.5 SENSITIVE Sensitive     Inducible Clindamycin NEGATIVE Sensitive     * FEW METHICILLIN RESISTANT STAPHYLOCOCCUS AUREUS      Imaging Studies   No results found.   Medications   Scheduled Meds: . amLODipine  2.5 mg Oral Daily  . aspirin EC  81 mg Oral Daily  . azelastine  1 spray Each Nare BID  . B-complex with vitamin C  1 tablet Oral Daily  . calcium citrate-vitamin D  1 tablet Oral Daily  . enoxaparin (LOVENOX) injection  40 mg Subcutaneous Q24H  . folic acid  1 mg Oral Daily  . furosemide  40 mg Oral Daily  . gabapentin  1,200 mg Oral BID  . melatonin  5 mg Oral QHS  . pantoprazole  40 mg Oral Daily  . potassium chloride  40 mEq Oral Daily  . pravastatin  80 mg Oral Daily  . primidone  50 mg Oral TID  . sodium chloride flush  10-40 mL Intracatheter Q12H  . tretinoin  1 application Topical QHS  . triamcinolone  1 application Topical BID  . cyanocobalamin  1,000 mcg Oral Daily   Continuous Infusions: . sodium chloride Stopped (08/10/20 1753)  . DAPTOmycin (CUBICIN)  IV Stopped (08/15/20 2200)  . magnesium sulfate bolus IVPB         LOS: 6 days    Time spent: 45 min    Desma Maxim, MD [facility]  08/16/2020, 11:36 AM      If 7PM-7AM, please contact night-coverage. How to contact  the [facility] Attending or Consulting provider [place] or covering provider during after hours [place], for this patient?    1. Check the care team in [facility] and look for a) attending/consulting [facility] provider listed and b) the [facility] team listed 2. Log into [email or web address] and use [place]'s universal password to access. If you do not have the password, please contact the hospital operator. 3. Locate the [facility] provider you are looking for under [facility] and page to a number that you can be  directly reached. 4. If you still have difficulty reaching the provider, please page the [facility] (Director on Call) for the Hospitalists listed on amion for assistance.

## 2020-08-16 NOTE — Progress Notes (Signed)
Patient's wife updated on room change.

## 2020-08-17 ENCOUNTER — Inpatient Hospital Stay: Payer: Medicare Other

## 2020-08-17 DIAGNOSIS — R7881 Bacteremia: Secondary | ICD-10-CM | POA: Diagnosis not present

## 2020-08-17 DIAGNOSIS — L02612 Cutaneous abscess of left foot: Secondary | ICD-10-CM | POA: Diagnosis not present

## 2020-08-17 DIAGNOSIS — B9562 Methicillin resistant Staphylococcus aureus infection as the cause of diseases classified elsewhere: Secondary | ICD-10-CM | POA: Diagnosis not present

## 2020-08-17 LAB — AEROBIC/ANAEROBIC CULTURE W GRAM STAIN (SURGICAL/DEEP WOUND): Gram Stain: NONE SEEN

## 2020-08-17 LAB — BASIC METABOLIC PANEL
Anion gap: 9 (ref 5–15)
BUN: 15 mg/dL (ref 8–23)
CO2: 27 mmol/L (ref 22–32)
Calcium: 9.2 mg/dL (ref 8.9–10.3)
Chloride: 98 mmol/L (ref 98–111)
Creatinine, Ser: 0.73 mg/dL (ref 0.61–1.24)
GFR, Estimated: >60 mL/min (ref >60)
Glucose, Bld: 99 mg/dL (ref 70–99)
Potassium: 3.7 mmol/L (ref 3.5–5.1)
Sodium: 134 mmol/L — ABNORMAL LOW (ref 135–145)

## 2020-08-17 LAB — MAGNESIUM: Magnesium: 1.7 mg/dL (ref 1.7–2.4)

## 2020-08-17 LAB — GLUCOSE, CAPILLARY: Glucose-Capillary: 91 mg/dL (ref 70–99)

## 2020-08-17 MED ORDER — PRAVASTATIN SODIUM 20 MG PO TABS
20.0000 mg | ORAL_TABLET | Freq: Every day | ORAL | Status: DC
  Administered 2020-08-18: 20 mg via ORAL
  Filled 2020-08-17: qty 1

## 2020-08-17 NOTE — Progress Notes (Signed)
Physical Therapy Treatment Patient Details Name: [name] MRN: [id number] DOB: 06/10/1936 Today's Date: 08/17/2020    History of Present Illness [name] is a 85 y.o. 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  Patient was seen in urgent care.  Patient states that he had left leg redness that is been getting worse over the past 4 days.  He states that he is not able to feel a lot of pain secondary to his neuropathy.  He states that he had a wound on the bottom of his heel but is unsure how he got there.  The redness has gotten worse, nothing makes it better, nothing makes it worse.  He states that he has not been on any antibiotics.  Patient noted to have a temperature of 37.2 and was tachycardic.  Patient was sent over here for IV antibiotics and hospital admission due to concern for sepsis.  Patient is not on any blood thinners.    PT Comments    Pt was pleasant and motivated to participate during the session.  Pt put forth very good effort with below therex with SpO2 and HR WNL during the session.  Pt training provided on sliding board transfer sequencing and standard lateral scoot transfer sequencing. Pt able to perform transfers with min A and cues for sequencing to ensure WB compliance.  Pt will benefit from PT services in a SNF setting upon discharge to safely address deficits listed in patient problem list for decreased caregiver assistance and eventual return to PLOF.     Follow Up Recommendations  SNF     Equipment Recommendations  Wheelchair cushion (measurements PT);Wheelchair (measurements PT)    Recommendations for Other Services       Precautions / Restrictions Precautions Precautions: Fall Restrictions Weight Bearing Restrictions: Yes LLE Weight Bearing: Non weight bearing    Mobility  Bed Mobility Overal bed mobility: Modified Independent             General bed mobility comments: Extra time and effort and use of  the bedrail required    Transfers Overall transfer level: Needs assistance Equipment used: Sliding board Transfers: Lateral/Scoot Transfers          Lateral/Scoot Transfers: From elevated surface;Min assist General transfer comment: Mod verbal cues for hand plancement, general sequencing, and WB compliance  Ambulation/Gait             General Gait Details: unable   Stairs             Wheelchair Mobility    Modified Rankin (Stroke Patients Only)       Balance Overall balance assessment: Needs assistance Sitting-balance support: Feet supported;Single extremity supported Sitting balance-Leahy Scale: Good         Standing balance comment: unable to stand                            Cognition Arousal/Alertness: Awake/alert Behavior During Therapy: WFL for tasks assessed/performed Overall Cognitive Status: Within Functional Limits for tasks assessed                                        Exercises Total Joint Exercises Ankle Circles/Pumps: Strengthening;AROM;10 reps;15 reps Quad Sets: Strengthening;Both;15 reps;10 reps Gluteal Sets: Strengthening;Both;10 reps;15 reps Towel Squeeze: Strengthening;Both;10 reps Short Arc Quad: Strengthening;Both;10 reps Hip ABduction/ADduction: AAROM;Strengthening;Both;10 reps  Straight Leg Raises: AAROM;Strengthening;Both;10 reps Long Arc Quad: AROM;Strengthening;Both;10 reps;15 reps Knee Flexion: 15 reps;10 reps;Both;Strengthening;AROM  Proper transfer sequencing education provided to nursing    General Comments        Pertinent Vitals/Pain Pain Assessment: No/denies pain    Home Living                      Prior Function            PT Goals (current goals can now be found in the care plan section) Progress towards PT goals: Progressing toward goals    Frequency    Min 2X/week      PT Plan Current plan remains appropriate    Co-evaluation               AM-PAC PT "6 Clicks" Mobility   Outcome Measure  Help needed turning from your back to your side while in a flat bed without using bedrails?: A Little Help needed moving from lying on your back to sitting on the side of a flat bed without using bedrails?: A Little Help needed moving to and from a bed to a chair (including a wheelchair)?: A Little Help needed standing up from a chair using your arms (e.g., wheelchair or bedside chair)?: Total Help needed to walk in hospital room?: Total Help needed climbing 3-5 steps with a railing? : Total 6 Click Score: 12    End of Session Equipment Utilized During Treatment: Gait belt Activity Tolerance: Patient tolerated treatment well Patient left: in chair;with call bell/phone within reach;with chair alarm set Nurse Communication: Mobility status;Weight bearing status;Other (comment) (Transfer sequencing to/from bed to chair) PT Visit Diagnosis: Other abnormalities of gait and mobility (R26.89);Muscle weakness (generalized) (M62.81)     Time: 8850-2774 PT Time Calculation (min) (ACUTE ONLY): 42 min  Charges:  $Therapeutic Exercise: 23-37 mins $Therapeutic Activity: 8-22 mins                     D. Scott Estle Sabella PT, DPT 08/17/20, 4:24 PM

## 2020-08-17 NOTE — Consult Note (Signed)
[place] Nurse wound follow up Wound type: Routine NPWT dressing change. Measurement: 8cm x 3cm x 0.4cm with closed incision distally. Wound bed: red. Moist.  Small amount of serosanguinous exudate upon dressing removal (stops with pressure).  Drainage (amount, consistency, odor)  Periwound: Macerated, intact.  Patient reports a period of time yesterday when NPWT device was not connected to power. Dressing procedure/placement/frequency: Dressing removed (2-pieces of black foam).  Wound and LE cleansed and allowed to dry.  Skin barrier ring placed around central wound and filled with 1 piece black foam. One piece of foam used to cover closed incision as this is where majority of maceration is located. Both areas secured with drape.  One piece of foam used to connect foams over distal closed incision and central wound.  Drape applied to dorsal foot.  One piece of foam used to bridge dressing to dorsum of foot and secured.  Four (4) pieces of foam used in total.  NPWT attached and an immediate seal is achieved at 111mmHg continuous negative pressure.  Patient tolerated procedure well.   Dr. Si Raider in at the conclusion of the dressing change visit to assess patient.  Patient asking questions about discharge to a Rehab facility.  [facility] nursing team will follow, and see patient on Wednesday for next planned dressing change if he is still in house.  Several Medium NPWT VAC dressing kits are in the room.     NOTE:  Patient should NOT take the hospital [facility] system out of the hospital upon discharge.  If discharged, please remove NPWT dressing and place a NS dressing for transport to facility.  NPWT will need to be applied in that care setting using facility NPWT equipment.   Thanks, Maudie Flakes, MSN, RN, [place], Arther Abbott  Pager# [phone]

## 2020-08-17 NOTE — Care Management Important Message (Signed)
Important Message  Patient Details  Name: [name] MRN: [id number] Date of Birth: 1936-02-20   Medicare Important Message Given:  Yes  Reviewed Medicare IM with spouse, [name], at [phone].  Aware of right to appeal discharge.   Dannette Barbara 08/17/2020, 4:19 PM

## 2020-08-17 NOTE — Progress Notes (Addendum)
PROGRESS NOTE    [name]   QIO:[id number]  DOB: 1936-02-17  PCP: [name], DO    DOA: 08/09/2020 LOS: 7   Brief Narrative   Summary of HPI on admission: 85 y.o. male with medical history significant for idiopathic peripheral neuropathy, hypertension, hyperlipidemia, GERD, presented to the ED on 08/09/2020 for chief concerns of worsening left leg pain since 2/24.  No preceding trauma.  No fevers or chills.  ED course: Temp 100.12F, tachycardic HR 106, respirations 23.  Labs notable for hyponatremia 125, hypokalemia 2.7, hypochloremia 86, hypomagnesemia 1.5, leukocytosis 14.6, mild anemia hemoglobin 12.7 macrocytic.  Negative COVID-19 and influenza's on PCR.  UA with moderate hemoglobin but negative for infection.  Blood cultures were drawn, returned positive for MRSA.  Hospital course to date: Admitted to hospitalist service for further evaluation and management of sepsis due to left foot cellulitis and multiple electrolyte derangements.  Left lower extremity venous US negative for DVT. 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 MRI left ankle negative for signs of abscess or osteomyelitis, but showed diffuse cellulitis of the ankle and hindfoot, sprain and partial tear of the ATFL and small joint effusion.  On broad-spectrum antibiotic coverage with vancomycin and cefepime.  Infectious disease consulted.   Assessment & Plan   Active Problems:   Sepsis ([place])   MRSA bacteremia    Sepsis secondary to MRSA bacteremia and Left Foot Cellulitis with abscess Sepsis POA as evidenced by leukocytosis, tachycardia, tachypnea.  Sepsis resolved. S/p I and D by podiatry on 3/2. TTE/TEE w/o signs vegetations. Picc placed 3/3. Infection improving. Podiatry has seen, ok to discharge. - continue wound vac, change m/w/f, was seen by wound care this AM - ID following, transitioned to  daptomycin 3/4 - pt has changed mind and wants snf placement, care mgmt working on that, will touch base w/ them today  # Cough With left rib pain. Lungs clear. Afebrile, no hypoxia or sob - f/u cxr  Hypokalemia - suspect secondary to diuretics Resolved - continue daily potassium 40 - monitor  Hyponatremia - POA, chronic. Likely 2/2 thiazide as improving off thiazide, close to normalized - hold hctz, monitor  Chronic systolic CHF / CAD / MR / TR - all appear stable.  Monitor volume status, renal function, electrolytes --Continue ASA --cont home lasix  Hypotension POA / Hx of Hypertension -resumed amlodipine 2.5 mg, lasix -Held: home HCTZ  Hyperlipidemia -pravastatin 80 mg daily  Idiopathic peripheral neuropathy -resumed gabapentin 1200 mg twice daily p.o.  Insomnia-melatonin 5 mg daily at bedtime  GERD -PPI   DVT prophylaxis: enoxaparin (LOVENOX) injection 40 mg Start: 08/09/20 2000 Place TED hose Start: 08/09/20 1758   Diet:  Diet Orders (From admission, onward)    Start     Ordered   08/14/20 0000  Diet - low sodium heart healthy        08/14/20 1138   08/13/20 0923  Diet regular Room service appropriate? Yes; Fluid consistency: Thin  Diet effective now       Question Answer Comment  Room service appropriate? Yes   Fluid consistency: Thin      08/13/20 0922            Code Status: Full Code    Subjective 08/17/20    Feeling well, no pain, has appetite, no n/v/d. Working with PT.   Disposition Plan & Communication   Status is: Inpatient  Inpatient status  appropriate due to severity of illness on IV antibiotics for MRSA bacteremia.  Dispo: The patient is from: Home              Anticipated d/c is to: SNF              Patient currently is medically stable for d/c.   Difficult to place patient No   Family Communication: wife updated bedside 3/4   Consults, Procedures, Significant Events   Consultants:   Infectious  Disease  Podiatry  Procedures:   None  Antimicrobials:  Anti-infectives (From admission, onward)   Start     Dose/Rate Route Frequency Ordered Stop   08/15/20 0000  daptomycin (CUBICIN) IVPB        750 mg Intravenous Every 24 hours 08/14/20 1407 08/29/20 2359   08/14/20 1430  DAPTOmycin (CUBICIN) 750 mg in sodium chloride 0.9 % IVPB        750 mg 230 mL/hr over 30 Minutes Intravenous Daily 08/14/20 1224     08/14/20 0000  linezolid (ZYVOX) 600 MG tablet  Status:  Discontinued        600 mg Oral Every 12 hours 08/14/20 1138 08/14/20    08/13/20 1800  linezolid (ZYVOX) tablet 600 mg  Status:  Discontinued        600 mg Oral Every 12 hours 08/13/20 1653 08/14/20 1613   08/13/20 1000  vancomycin (VANCOREADY) IVPB 1750 mg/350 mL  Status:  Discontinued        1,750 mg 175 mL/hr over 120 Minutes Intravenous Every 24 hours 08/12/20 1446 08/14/20 1224   08/12/20 1157  vancomycin (VANCOCIN) 1-5 GM/200ML-% IVPB       Note to Pharmacy: Norton Blizzard  : cabinet override      08/12/20 1157 08/12/20 2359   08/10/20 1100  vancomycin (VANCOREADY) IVPB 1000 mg/200 mL  Status:  Discontinued        1,000 mg 200 mL/hr over 60 Minutes Intravenous Every 12 hours 08/09/20 1905 08/12/20 1254   08/09/20 2345  ceFEPIme (MAXIPIME) 2 g in sodium chloride 0.9 % 100 mL IVPB  Status:  Discontinued        2 g 200 mL/hr over 30 Minutes Intravenous Every 8 hours 08/09/20 1852 08/09/20 1854   08/09/20 2345  ceFEPIme (MAXIPIME) 2 g in sodium chloride 0.9 % 100 mL IVPB  Status:  Discontinued        2 g 200 mL/hr over 30 Minutes Intravenous Every 8 hours 08/09/20 1854 08/13/20 1327   08/09/20 1930  vancomycin (VANCOCIN) IVPB 1000 mg/200 mL premix  Status:  Discontinued        1,000 mg 200 mL/hr over 60 Minutes Intravenous  Once 08/09/20 1852 08/09/20 1854   08/09/20 1930  vancomycin (VANCOCIN) IVPB 1000 mg/200 mL premix        1,000 mg 200 mL/hr over 60 Minutes Intravenous  Once 08/09/20 1854 08/10/20 0020    08/09/20 1600  ceFEPIme (MAXIPIME) 2 g in sodium chloride 0.9 % 100 mL IVPB        2 g 200 mL/hr over 30 Minutes Intravenous  Once 08/09/20 1555 08/09/20 1636   08/09/20 1600  metroNIDAZOLE (FLAGYL) IVPB 500 mg        500 mg 100 mL/hr over 60 Minutes Intravenous  Once 08/09/20 1555 08/09/20 1750   08/09/20 1600  vancomycin (VANCOCIN) IVPB 1000 mg/200 mL premix        1,000 mg 200 mL/hr over 60 Minutes Intravenous  Once 08/09/20 1555 08/09/20  1926        Micro    Objective   Vitals:   08/16/20 2252 08/17/20 0624 08/17/20 0917 08/17/20 0918  BP: (!) 146/76 (!) 147/80 137/72 137/72  Pulse: 88 79  86  Resp: 18 16  18   Temp: 98.4 F (36.9 C) 99.1 F (37.3 C)    TempSrc:      SpO2: 95% 96%  95%  Weight:      Height:        Intake/Output Summary (Last 24 hours) at 08/17/2020 1024 Last data filed at 08/16/2020 2252 Gross per 24 hour  Intake --  Output 900 ml  Net -900 ml   Filed Weights   08/13/20 0751 08/14/20 0550 08/16/20 0100  Weight: 98 kg 94.5 kg 91.5 kg    Physical Exam:  General exam: awake, alert, no acute distress Respiratory system: symmetric chest rise, normal respiratory effort, on room air. Cardiovascular system: RRR, no RLE edema, LLE edema due to cellulitis.   Central nervous system: A&O x3. no gross focal neurologic deficits, normal speech Extremities: left foot wound vac applied, erythematous and edematous to lower calf, improving Psychiatry: normal mood, congruent affect, judgement and insight appear normal  Labs   Data Reviewed: I have personally reviewed following labs and imaging studies  CBC: Recent Labs  Lab 08/12/20 0525 08/13/20 0715 08/14/20 0426 08/15/20 0541 08/16/20 0600  WBC 8.6 5.6 5.4 5.4 6.7  HGB 10.0* 10.0* 9.9* 10.9* 11.0*  HCT 28.5* 28.6* 28.1* 30.9* 30.8*  MCV 104.0* 103.6* 103.7* 102.7* 103.0*  PLT 226 247 270 293 150   Basic Metabolic Panel: Recent Labs  Lab 08/13/20 0715 08/14/20 0426 08/15/20 0541 08/16/20 0600  08/17/20 0500  NA 132* 133* 134* 134* 134*  K 3.2* 3.7 3.5 3.9 3.7  CL 99 100 99 99 98  CO2 24 25 26 27 27   GLUCOSE 95 96 92 104* 99  BUN 13 15 12 14 15   CREATININE 0.83 0.81 0.77 0.77 0.73  CALCIUM 9.2 9.0 9.5 9.4 9.2  MG 1.4* 1.7 1.6* 1.7 1.7   GFR: Estimated Creatinine Clearance: 78.1 mL/min (by C-G formula based on SCr of 0.73 mg/dL). Liver Function Tests: No results for input(s): AST, ALT, ALKPHOS, BILITOT, PROT, ALBUMIN in the last 168 hours. No results for input(s): LIPASE, AMYLASE in the last 168 hours. No results for input(s): AMMONIA in the last 168 hours. Coagulation Profile: No results for input(s): INR, PROTIME in the last 168 hours. Cardiac Enzymes: Recent Labs  Lab 08/14/20 0426  CKTOTAL 23*   BNP (last 3 results) No results for input(s): PROBNP in the last 8760 hours. HbA1C: No results for input(s): HGBA1C in the last 72 hours. CBG: Recent Labs  Lab 08/12/20 0928 08/17/20 0807  GLUCAP 121* 91   Lipid Profile: No results for input(s): CHOL, HDL, LDLCALC, TRIG, CHOLHDL, LDLDIRECT in the last 72 hours. Thyroid Function Tests: No results for input(s): TSH, T4TOTAL, FREET4, T3FREE, THYROIDAB in the last 72 hours. Anemia Panel: No results for input(s): VITAMINB12, FOLATE, FERRITIN, TIBC, IRON, RETICCTPCT in the last 72 hours. Sepsis Labs: No results for input(s): PROCALCITON, LATICACIDVEN in the last 168 hours.  Recent Results (from the past 240 hour(s))  Blood culture (routine x 2)     Status: Abnormal   Collection Time: 08/09/20  3:49 PM   Specimen: BLOOD  Result Value Ref Range Status   Specimen Description   Final    BLOOD BLOOD LEFT FOREARM Performed at [facility], [address] [place], [place] [address]    Special Requests   Final    BOTTLES DRAWN AEROBIC AND ANAEROBIC Blood Culture adequate volume Performed at [facility], [address] [place], [place] [address]    Culture  Setup Time   Final    GRAM POSITIVE  COCCI AEROBIC BOTTLE ONLY CRITICAL RESULT CALLED TO, READ BACK BY AND VERIFIED WITH: NATHAN BELUE ON 08/10/20 AT 5027 QSD Performed at [facility], [place] [address], [place], [place] [address]    Culture METHICILLIN RESISTANT STAPHYLOCOCCUS AUREUS (A)  Final   Report Status 08/12/2020 FINAL  Final   Organism ID, Bacteria METHICILLIN RESISTANT STAPHYLOCOCCUS AUREUS  Final      Susceptibility   Methicillin resistant staphylococcus aureus - MIC*    CIPROFLOXACIN >=8 RESISTANT Resistant     ERYTHROMYCIN <=0.25 SENSITIVE Sensitive     GENTAMICIN <=0.5 SENSITIVE Sensitive     OXACILLIN >=4 RESISTANT Resistant     TETRACYCLINE >=16 RESISTANT Resistant     VANCOMYCIN 1 SENSITIVE Sensitive     TRIMETH/SULFA <=10 SENSITIVE Sensitive     CLINDAMYCIN <=0.25 SENSITIVE Sensitive     RIFAMPIN <=0.5 SENSITIVE Sensitive     Inducible Clindamycin NEGATIVE Sensitive     * METHICILLIN RESISTANT STAPHYLOCOCCUS AUREUS  Blood Culture ID Panel (Reflexed)     Status: Abnormal   Collection Time: 08/09/20  3:49 PM  Result Value Ref Range Status   Enterococcus faecalis NOT DETECTED NOT DETECTED Final   Enterococcus Faecium NOT DETECTED NOT DETECTED Final   Listeria monocytogenes NOT DETECTED NOT DETECTED Final   Staphylococcus species DETECTED (A) NOT DETECTED Final    Comment: CRITICAL RESULT CALLED TO, READ BACK BY AND VERIFIED WITH: NATHAN BELUE ON 08/10/20 AT 7867 QSD    Staphylococcus aureus (BCID) DETECTED (A) NOT DETECTED Final    Comment: Methicillin (oxacillin)-resistant Staphylococcus aureus (MRSA). MRSA is predictably resistant to beta-lactam antibiotics (except ceftaroline). Preferred therapy is vancomycin unless clinically contraindicated. Patient requires contact precautions if  hospitalized. CRITICAL RESULT CALLED TO, READ BACK BY AND VERIFIED WITH: NATHAN BELUE ON 08/10/20 AT 0616 QSD    Staphylococcus epidermidis NOT DETECTED NOT DETECTED Final   Staphylococcus lugdunensis NOT DETECTED  NOT DETECTED Final   Streptococcus species NOT DETECTED NOT DETECTED Final   Streptococcus agalactiae NOT DETECTED NOT DETECTED Final   Streptococcus pneumoniae NOT DETECTED NOT DETECTED Final   Streptococcus pyogenes NOT DETECTED NOT DETECTED Final   A.calcoaceticus-baumannii NOT DETECTED NOT DETECTED Final   Bacteroides fragilis NOT DETECTED NOT DETECTED Final   Enterobacterales NOT DETECTED NOT DETECTED Final   Enterobacter cloacae complex NOT DETECTED NOT DETECTED Final   Escherichia coli NOT DETECTED NOT DETECTED Final   Klebsiella aerogenes NOT DETECTED NOT DETECTED Final   Klebsiella oxytoca NOT DETECTED NOT DETECTED Final   Klebsiella pneumoniae NOT DETECTED NOT DETECTED Final   Proteus species NOT DETECTED NOT DETECTED Final   Salmonella species NOT DETECTED NOT DETECTED Final   Serratia marcescens NOT DETECTED NOT DETECTED Final   Haemophilus influenzae NOT DETECTED NOT DETECTED Final   Neisseria meningitidis NOT DETECTED NOT DETECTED Final   Pseudomonas aeruginosa NOT DETECTED NOT DETECTED Final   Stenotrophomonas maltophilia NOT DETECTED NOT DETECTED Final   Candida albicans NOT DETECTED NOT DETECTED Final   Candida auris NOT DETECTED NOT DETECTED Final   Candida glabrata NOT DETECTED NOT DETECTED Final   Candida krusei NOT DETECTED NOT DETECTED Final   Candida parapsilosis NOT DETECTED NOT DETECTED Final   Candida  tropicalis NOT DETECTED NOT DETECTED Final   Cryptococcus neoformans/gattii NOT DETECTED NOT DETECTED Final   Meth resistant mecA/C and MREJ DETECTED (A) NOT DETECTED Final    Comment: CRITICAL RESULT CALLED TO, READ BACK BY AND VERIFIED WITH: NATHAN BELUE ON 08/10/20 AT 8299 QSD Performed at [facility], [address] [place], [place] [address]   Blood culture (routine x 2)     Status: Abnormal   Collection Time: 08/09/20  3:50 PM   Specimen: BLOOD  Result Value Ref Range Status   Specimen Description   Final    BLOOD RIGHT  ANTECUBITAL Performed at [facility], [address] [place], [place] [address]    Special Requests   Final    BOTTLES DRAWN AEROBIC AND ANAEROBIC Blood Culture adequate volume Performed at [facility], [address], [place], [place] [address]    Culture  Setup Time   Final    GRAM POSITIVE COCCI IN BOTH AEROBIC AND ANAEROBIC BOTTLES CRITICAL VALUE NOTED.  VALUE IS CONSISTENT WITH PREVIOUSLY REPORTED AND CALLED VALUE. Performed at [facility], [address] [place], [place] [address]    Culture (A)  Final    STAPHYLOCOCCUS AUREUS SUSCEPTIBILITIES PERFORMED ON PREVIOUS CULTURE WITHIN THE LAST 5 DAYS. Performed at [facility], [place] [address], [place],  [address]    Report Status 08/12/2020 FINAL  Final  Resp Panel by RT-PCR (Flu A&B, Covid) Nasopharyngeal Swab     Status: None   Collection Time: 08/09/20  4:10 PM   Specimen: Nasopharyngeal Swab; Nasopharyngeal(NP) swabs in vial transport medium  Result Value Ref Range Status   SARS Coronavirus 2 by RT PCR NEGATIVE NEGATIVE Final    Comment: (NOTE) SARS-CoV-2 target nucleic acids are NOT DETECTED.  The SARS-CoV-2 RNA is generally detectable in upper respiratory specimens during the acute phase of infection. The lowest concentration of SARS-CoV-2 viral copies this assay can detect is 138 copies/mL. A negative result does not preclude SARS-Cov-2 infection and should not be used as the sole basis for treatment or other patient management decisions. A negative result may occur with  improper specimen collection/handling, submission of specimen other than nasopharyngeal swab, presence of viral mutation(s) within the areas targeted by this assay, and inadequate number of viral copies(<138 copies/mL). A negative result must be combined with clinical observations, patient history, and epidemiological information. The expected result is Negative.  Fact Sheet for Patients:   EntrepreneurPulse.com.au  Fact Sheet for Healthcare Providers:  IncredibleEmployment.be  This test is no t yet approved or cleared by the [place] FDA and  has been authorized for detection and/or diagnosis of SARS-CoV-2 by FDA under an Emergency Use Authorization (EUA). This EUA will remain  in effect (meaning this test can be used) for the duration of the COVID-19 declaration under Section 564(b)(1) of the Act, 21 U.S.C.section 360bbb-3(b)(1), unless the authorization is terminated  or revoked sooner.       Influenza A by PCR NEGATIVE NEGATIVE Final   Influenza B by PCR NEGATIVE NEGATIVE Final    Comment: (NOTE) The Xpert Xpress SARS-CoV-2/FLU/RSV plus assay is intended as an aid in the diagnosis of influenza from Nasopharyngeal swab specimens and should not be used as a sole basis for treatment. Nasal washings and aspirates are unacceptable for Xpert Xpress SARS-CoV-2/FLU/RSV testing.  Fact Sheet for Patients: EntrepreneurPulse.com.au  Fact Sheet for Healthcare Providers: IncredibleEmployment.be  This test is not yet approved or cleared by the [place] FDA and has been  authorized for detection and/or diagnosis of SARS-CoV-2 by FDA under an Emergency Use Authorization (EUA). This EUA will remain in effect (meaning this test can be used) for the duration of the COVID-19 declaration under Section 564(b)(1) of the Act, 21 U.S.C. section 360bbb-3(b)(1), unless the authorization is terminated or revoked.  Performed at [facility], [address] [place], [place] [address]   MRSA PCR Screening     Status: Abnormal   Collection Time: 08/10/20 10:50 AM   Specimen: Nasal Mucosa; Nasopharyngeal  Result Value Ref Range Status   MRSA by PCR POSITIVE (A) NEGATIVE Final    Comment:        The GeneXpert MRSA Assay (FDA approved for NASAL specimens only), is one component of  a comprehensive MRSA colonization surveillance program. It is not intended to diagnose MRSA infection nor to guide or monitor treatment for MRSA infections. RESULT CALLED TO, READ BACK BY AND VERIFIED WITH: Jonnie Kind 08/10/20 AT 1257 BY ACR Performed at [facility], [address] [place], [place] [address]   CULTURE, BLOOD (ROUTINE X 2) w Reflex to ID Panel     Status: None   Collection Time: 08/10/20 11:55 PM   Specimen: BLOOD  Result Value Ref Range Status   Specimen Description BLOOD BLOOD LEFT HAND  Final   Special Requests   Final    BOTTLES DRAWN AEROBIC AND ANAEROBIC Blood Culture adequate volume   Culture   Final    NO GROWTH 5 DAYS Performed at [facility], [address] [place], [place] [address]    Report Status 08/16/2020 FINAL  Final  CULTURE, BLOOD (ROUTINE X 2) w Reflex to ID Panel     Status: None   Collection Time: 08/10/20 11:55 PM   Specimen: BLOOD  Result Value Ref Range Status   Specimen Description BLOOD BLOOD RIGHT HAND  Final   Special Requests   Final    BOTTLES DRAWN AEROBIC AND ANAEROBIC Blood Culture adequate volume   Culture   Final    NO GROWTH 5 DAYS Performed at [facility], [address], [place], [place] [address]    Report Status 08/16/2020 FINAL  Final  Aerobic/Anaerobic Culture w Gram Stain (surgical/deep wound)     Status: None   Collection Time: 08/11/20  4:40 PM   Specimen: Wound; Tissue  Result Value Ref Range Status   Specimen Description   Final    WOUND Performed at [facility], [address], [place], [place] [address]    Special Requests   Final    LEFT FOOT Performed at [facility], [address] [place], [place] [address]    Gram Stain   Final    FEW WBC PRESENT,BOTH PMN AND MONONUCLEAR GRAM POSITIVE COCCI    Culture   Final    ABUNDANT METHICILLIN RESISTANT STAPHYLOCOCCUS AUREUS NO ANAEROBES ISOLATED Performed at [facility], [place]  [address] [place],  [address]    Report Status 08/16/2020 FINAL  Final   Organism ID, Bacteria METHICILLIN RESISTANT STAPHYLOCOCCUS AUREUS  Final      Susceptibility   Methicillin resistant staphylococcus aureus - MIC*    CIPROFLOXACIN >=8 RESISTANT Resistant     ERYTHROMYCIN <=0.25 SENSITIVE Sensitive     GENTAMICIN <=0.5 SENSITIVE Sensitive     OXACILLIN >=4 RESISTANT Resistant     TETRACYCLINE >=16 RESISTANT Resistant     VANCOMYCIN <=0.5 SENSITIVE Sensitive     TRIMETH/SULFA <=10 SENSITIVE Sensitive  CLINDAMYCIN <=0.25 SENSITIVE Sensitive     RIFAMPIN <=0.5 SENSITIVE Sensitive     Inducible Clindamycin NEGATIVE Sensitive     * ABUNDANT METHICILLIN RESISTANT STAPHYLOCOCCUS AUREUS  Aerobic/Anaerobic Culture w Gram Stain (surgical/deep wound)     Status: None (Preliminary result)   Collection Time: 08/12/20  1:38 PM   Specimen: Wound  Result Value Ref Range Status   Specimen Description   Final    WOUND Performed at [facility], [address], [place], [place] [address]    Special Requests   Final    NONE Performed at [facility], [address] [place], [place] [address]    Gram Stain   Final    NO WBC SEEN RARE GRAM POSITIVE COCCI IN CLUSTERS Performed at [facility], [place] [address] [place], [place] [address]    Culture   Final    FEW METHICILLIN RESISTANT STAPHYLOCOCCUS AUREUS NO ANAEROBES ISOLATED; CULTURE IN PROGRESS FOR 5 DAYS    Report Status PENDING  Incomplete   Organism ID, Bacteria METHICILLIN RESISTANT STAPHYLOCOCCUS AUREUS  Final      Susceptibility   Methicillin resistant staphylococcus aureus - MIC*    CIPROFLOXACIN 4 RESISTANT Resistant     ERYTHROMYCIN <=0.25 SENSITIVE Sensitive     GENTAMICIN <=0.5 SENSITIVE Sensitive     OXACILLIN >=4 RESISTANT Resistant     TETRACYCLINE >=16 RESISTANT Resistant     VANCOMYCIN 1 SENSITIVE Sensitive     TRIMETH/SULFA <=10 SENSITIVE Sensitive     CLINDAMYCIN <=0.25 SENSITIVE  Sensitive     RIFAMPIN <=0.5 SENSITIVE Sensitive     Inducible Clindamycin NEGATIVE Sensitive     * FEW METHICILLIN RESISTANT STAPHYLOCOCCUS AUREUS      Imaging Studies   No results found.   Medications   Scheduled Meds: . amLODipine  2.5 mg Oral Daily  . aspirin EC  81 mg Oral Daily  . azelastine  1 spray Each Nare BID  . B-complex with vitamin C  1 tablet Oral Daily  . calcium citrate-vitamin D  1 tablet Oral Daily  . Chlorhexidine Gluconate Cloth  6 each Topical Daily  . enoxaparin (LOVENOX) injection  40 mg Subcutaneous Q24H  . folic acid  1 mg Oral Daily  . furosemide  40 mg Oral Daily  . gabapentin  1,200 mg Oral BID  . melatonin  5 mg Oral QHS  . pantoprazole  40 mg Oral Daily  . potassium chloride  40 mEq Oral Daily  . pravastatin  80 mg Oral Daily  . primidone  50 mg Oral TID  . sodium chloride flush  10-40 mL Intracatheter Q12H  . tretinoin  1 application Topical QHS  . triamcinolone  1 application Topical BID  . cyanocobalamin  1,000 mcg Oral Daily   Continuous Infusions: . sodium chloride Stopped (08/10/20 1753)  . DAPTOmycin (CUBICIN)  IV Stopped (08/16/20 2231)       LOS: 7 days    Time spent: 32 min    Desma Maxim, MD [facility]  08/17/2020, 10:24 AM      If 7PM-7AM, please contact night-coverage. How to contact the [facility] Attending or Consulting provider [place] or covering provider during after hours [place], for this patient?    1. Check the care team in [facility] and look for a) attending/consulting TRH provider listed and b) the [facility] team listed 2. Log into [email or web address] and use [place]'s universal password to access. If you do not have the password,  please contact the hospital operator. 3. Locate the [facility] provider you are looking for under [facility] and page to a number that you can be directly reached. 4. If you still have difficulty reaching the provider, please page the [facility] (Director on Call) for the Hospitalists listed on  amion for assistance.

## 2020-08-17 NOTE — TOC Progression Note (Signed)
Transition of Care [facility]) - Progression Note    Patient Details  Name: [name] MRN: [id number] Date of Birth: 01-29-1936  Transition of Care [facility]) CM/SW Contact  Eileen Stanford, LCSW Phone Number: 08/17/2020, 10:44 AM  Clinical Narrative:   Pt with no bed offers. Referral faxed out further.         Expected Discharge Plan and Services           Expected Discharge Date: 08/14/20                                     Social Determinants of Health (SDOH) Interventions    Readmission Risk Interventions No flowsheet data found.

## 2020-08-17 NOTE — Progress Notes (Signed)
Date of Admission:  08/09/2020      ID: [name] is a 85 y.o. male Active Problems:   Sepsis ([place])   MRSA bacteremia    Subjective: Patient says he has pain on the left side of his chest while taking a deep breath.  Had some cough  Medications:  . amLODipine  2.5 mg Oral Daily  . aspirin EC  81 mg Oral Daily  . azelastine  1 spray Each Nare BID  . B-complex with vitamin C  1 tablet Oral Daily  . calcium citrate-vitamin D  1 tablet Oral Daily  . Chlorhexidine Gluconate Cloth  6 each Topical Daily  . enoxaparin (LOVENOX) injection  40 mg Subcutaneous Q24H  . folic acid  1 mg Oral Daily  . furosemide  40 mg Oral Daily  . gabapentin  1,200 mg Oral BID  . melatonin  5 mg Oral QHS  . pantoprazole  40 mg Oral Daily  . potassium chloride  40 mEq Oral Daily  . pravastatin  80 mg Oral Daily  . primidone  50 mg Oral TID  . sodium chloride flush  10-40 mL Intracatheter Q12H  . tretinoin  1 application Topical QHS  . triamcinolone  1 application Topical BID  . cyanocobalamin  1,000 mcg Oral Daily    Objective: Vital signs in last 24 hours: Temp:  [98.4 F (36.9 C)-99.1 F (37.3 C)] 98.6 F (37 C) (03/07 1100) Pulse Rate:  [79-88] 84 (03/07 1100) Resp:  [16-18] 18 (03/07 1100) BP: (136-147)/(72-80) 136/73 (03/07 1100) SpO2:  [95 %-96 %] 96 % (03/07 1100)  PHYSICAL EXAM:  General: Alert, cooperative,  Head: Normocephalic, without obvious abnormality, atraumatic. Eyes: Conjunctivae clear, anicteric sclerae. Pupils are equal ENT Nares normal. No drainage or sinus tenderness. Lips, mucosa, and tongue normal. No Thrush Neck: Supple, symmetrical, no adenopathy, thyroid: non tender no carotid bruit and no JVD. Back: No CVA tenderness. Lungs: Bilateral air entry. Tenderness over the lower rib area on the left side. Heart: Regular rate and rhythm, no murmur, rub or gallop. Abdomen: Soft, non-tender,not distended. Bowel sounds normal. No masses Extremities: Left leg swelling,  erythema much better Has a wound VAC       skin: No rashes or lesions. Or bruising Lymph: Cervical, supraclavicular normal. Neurologic: Grossly non-focal  Lab Results Recent Labs    08/15/20 0541 08/16/20 0600 08/17/20 0500  WBC 5.4 6.7  --   HGB 10.9* 11.0*  --   HCT 30.9* 30.8*  --   NA 134* 134* 134*  K 3.5 3.9 3.7  CL 99 99 98  CO2 26 27 27   BUN 12 14 15   CREATININE 0.77 0.77 0.73   Microbiology: Blood culture 08/09/2020 MRSA Repeat blood culture 08/10/2020 no growth 08/11/2020 wound culture MRSA 08/12/2020 wound culture MRSA  Studies/Results: DG Chest Port 1 View  Result Date: 08/17/2020 CLINICAL DATA:  Left-sided rib pain. Lower leg swelling and cellulitis. EXAM: PORTABLE CHEST 1 VIEW COMPARISON:  09/15/2019. FINDINGS: Trachea is midline. Right PICC tip is in the SVC. Heart is enlarged, unchanged. Thoracic aorta is calcified. Lungs are clear. There may be tiny bilateral pleural effusions. IMPRESSION: 1. Question tiny bilateral pleural effusions. 2.  Aortic atherosclerosis (ICD10-I70.0). Electronically Signed   By: Lorin Picket M.D.   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  Status post debridement of the abscess.  Left ankle joint not involved by infection. TEE negative Repeat blood culture negative  Patient is currently on daptomycin.  He will need IV antibiotics until 09/04/2020.  May need more than that if the wound has not healed. While on daptomycin will need weekly CPK, CBC with differential to look for eosinophilia. Also watch out for eosinophilic pneumonitis secondary to daptomycin. Also watch out for rhabdomyolysis as patient is also on statin.  Currently on 80 mg of Pravachol would reduce it to 20 mg.  Peripheral neuropathy  CAD status post CABG On Pravachol and aspirin  Called [facility] pharmacy used to discuss daptomycin on discharge.  They have approved it.  Discussed the management with  the care team.

## 2020-08-18 DIAGNOSIS — B9562 Methicillin resistant Staphylococcus aureus infection as the cause of diseases classified elsewhere: Secondary | ICD-10-CM | POA: Diagnosis not present

## 2020-08-18 DIAGNOSIS — R7881 Bacteremia: Secondary | ICD-10-CM | POA: Diagnosis not present

## 2020-08-18 LAB — BASIC METABOLIC PANEL
Anion gap: 9 (ref 5–15)
BUN: 16 mg/dL (ref 8–23)
CO2: 27 mmol/L (ref 22–32)
Calcium: 9.2 mg/dL (ref 8.9–10.3)
Chloride: 98 mmol/L (ref 98–111)
Creatinine, Ser: 0.79 mg/dL (ref 0.61–1.24)
GFR, Estimated: >60 mL/min (ref >60)
Glucose, Bld: 94 mg/dL (ref 70–99)
Potassium: 3.5 mmol/L (ref 3.5–5.1)
Sodium: 134 mmol/L — ABNORMAL LOW (ref 135–145)

## 2020-08-18 LAB — MAGNESIUM: Magnesium: 1.7 mg/dL (ref 1.7–2.4)

## 2020-08-18 LAB — SARS CORONAVIRUS 2 (TAT 6-24 HRS): SARS Coronavirus 2: NEGATIVE

## 2020-08-18 MED ORDER — PRAVASTATIN SODIUM 20 MG PO TABS: 80.0000 mg | ORAL_TABLET | Freq: Every day | ORAL | Status: DC

## 2020-08-18 NOTE — TOC Transition Note (Addendum)
Transition of Care [facility]) - CM/SW Discharge Note   Patient Details  Name: [name] MRN: [id number] Date of Birth: [birth year]  [facility] CM/SW Contact:  Eileen Stanford, LCSW Phone Number: 08/18/2020, 10:02 AM   Clinical Narrative:   Clinical Social Worker facilitated patient discharge including contacting patient family and facility to confirm patient discharge plans.  Clinical information faxed to facility and family agreeable with plan.  CSW arranged ambulance transport via [facility] (12:30) to [facility] .  RN to call [phone] for report prior to discharge.  Facility has ordered wound vac and it will arrive at the facility tomorrow morning. MD is aware and is agreeable to wet to dry dressings until the wound vac arrived.    Final next level of care: Skilled Nursing Facility Barriers to Discharge: No Barriers Identified   Patient Goals and CMS Choice     Choice offered to / list presented to : [facility]  Discharge Placement              Patient chooses bed at:  ([facility]) Patient to be transferred to facility by: [facility] Name of family member notified: Pt and pt's spouse Patient and family notified of of transfer: 08/18/20  Discharge Plan and Services                                     Social Determinants of Health (SDOH) Interventions     Readmission Risk Interventions No flowsheet data found.

## 2020-08-18 NOTE — Progress Notes (Addendum)
Attempted to call report to the facility but no answer.  12:45 PM report given to Caryl Pina, a floor nurse at [facility].     [phone] EMS is here to transport pt.

## 2020-08-18 NOTE — Progress Notes (Signed)
Occupational Therapy Treatment Patient Details Name: [name] MRN: [id number] DOB: Jan 14, 1936 Today's Date: 08/18/2020    History of present illness [name] is a 85 y.o. 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  Patient was seen in urgent care.  Patient states that he had left leg redness that is been getting worse over the past 4 days.  He states that he is not able to feel a lot of pain secondary to his neuropathy.  He states that he had a wound on the bottom of his heel but is unsure how he got there.  The redness has gotten worse, nothing makes it better, nothing makes it worse.  He states that he has not been on any antibiotics.  Patient noted to have a temperature of 37.2 and was tachycardic.  Patient was sent over here for IV antibiotics and hospital admission due to concern for sepsis.  Patient is not on any blood thinners.   OT comments  [name] was seen for OT treatment on this date. Upon arrival to room pt finishing breakfast, agreeable to session. Pt requires MAX A don/doff B socks at bed level. Increased time only to exit L side of bed. MIN A for lateral scoot t/f elevated bed>chair - VCs for hand plancement, general sequencing, and NWB compliance. SETUP face washing reclined in chair. Pt making good progress toward goals. Pt continues to benefit from skilled OT services to maximize return to PLOF and minimize risk of future falls, injury, caregiver burden, and readmission. Will continue to follow POC. Discharge recommendation remains appropriate.    Follow Up Recommendations  SNF    Equipment Recommendations  Other (comment) (bariatric drop arm BSC)    Recommendations for Other Services      Precautions / Restrictions Precautions Precautions: Fall Restrictions Weight Bearing Restrictions: Yes LLE Weight Bearing: Non weight bearing       Mobility Bed Mobility Overal bed mobility: Modified Independent                   Transfers Overall transfer level: Needs assistance   Transfers: Lateral/Scoot Transfers          Lateral/Scoot Transfers: From elevated surface;Min assist General transfer comment: VCs for hand plancement, general sequencing, and NWB compliance    Balance Overall balance assessment: Needs assistance Sitting-balance support: Feet supported;Single extremity supported Sitting balance-Leahy Scale: Good                                     ADL either performed or assessed with clinical judgement   ADL Overall ADL's : Needs assistance/impaired                                       General ADL Comments: MAX A don/doff B socks at bed level. MIN A for lateral scoot t/f bed>chair. SETUP face washing reclined in chair               Cognition Arousal/Alertness: Awake/alert Behavior During Therapy: WFL for tasks assessed/performed Overall Cognitive Status: Within Functional Limits for tasks assessed  Exercises Exercises: Other exercises Other Exercises Other Exercises: Pt educated re: OT role, DME recs, d/c recs, HEP, falls prevention Other Exercises: LBD, sup>sit, lateral scoot, self-drinking, sitting balance/tolerance   Shoulder Instructions       General Comments      Pertinent Vitals/ Pain       Pain Assessment: No/denies pain         Frequency  Min 1X/week        Progress Toward Goals  OT Goals(current goals can now be found in the care plan section)  Progress towards OT goals: Progressing toward goals  Acute Rehab OT Goals Patient Stated Goal: To go home safely OT Goal Formulation: With patient Time For Goal Achievement: 08/28/20 Potential to Achieve Goals: Good  Plan Discharge plan remains appropriate;Frequency remains appropriate       AM-PAC OT "6 Clicks" Daily Activity     Outcome Measure   Help from another person eating meals?: None Help from another  person taking care of personal grooming?: A Little Help from another person toileting, which includes using toliet, bedpan, or urinal?: A Lot Help from another person bathing (including washing, rinsing, drying)?: A Lot Help from another person to put on and taking off regular upper body clothing?: A Little Help from another person to put on and taking off regular lower body clothing?: A Lot 6 Click Score: 16    End of Session    OT Visit Diagnosis: Other abnormalities of gait and mobility (R26.89);Muscle weakness (generalized) (M62.81)   Activity Tolerance Patient tolerated treatment well   Patient Left in chair;with call bell/phone within reach;with chair alarm set   Nurse Communication Mobility status        Time: 1561-5379 OT Time Calculation (min): 25 min  Charges: OT General Charges $OT Visit: 1 Visit OT Treatments $Self Care/Home Management : 23-37 mins  Dessie Coma, M.S. OTR/L  08/18/20, 1:02 PM  ascom [phone]

## 2020-08-18 NOTE — Discharge Summary (Signed)
[name] ZPH:[id number] DOB: 1935-10-14 DOA: 08/09/2020  PCP: [name], DO  Admit date: 08/09/2020 Discharge date: 08/18/2020  Time spent: 45 minutes  Recommendations for Outpatient Follow-up:  1. daptomycin through 3/25 2. Will need weekly: cbc with diff, cmp, esr, crp, cpk   3. Maintain wound vac for at least 2 more weeks 4. Keep picc in place after termination of antibiotics until advised to discontinue by ID 5. Outpatient f/u with podiatry (Dr. Vickki Muff) in 2 weeks, needs to arrange 6.  f/u with Infectious Disease ([name]) in one week, needs to arrange    Discharge Diagnoses:  Active Problems:   Sepsis ([place])   MRSA bacteremia   Discharge Condition: stable  Diet recommendation: heart healthy  Filed Weights   08/13/20 0751 08/14/20 0550 08/16/20 0100  Weight: 98 kg 94.5 kg 91.5 kg    History of present illness:   [name] is a 85 y.o. male with medical history significant for idiopathic peripheral neuropathy, hypertension, hyperlipidemia, GERD, presented to the emergency department for chief concerns of left leg pain.  He describes the pain as throbbing, 8/10, worse with weight bearing, started Thursday, 08/06/2020.  He denies trauma to the area.  He denies fever at home, nausea, vomiting, chest pain, shortness of breath, abdominal pain, dysuria, hematuria.  Hospital Course:  Sepsis secondary to MRSA bacteremia and Left Foot Cellulitis with abscess Sepsis POA as evidenced by leukocytosis, tachycardia, tachypnea.  Sepsis resolved. S/p I and D by podiatry on 3/2. TTE/TEE w/o signs vegetations. Picc placed 3/3. Infection improving. Podiatry has seen, ok to discharge. - continue wound vac, change m/w/f - continue daptomycin until 09/04/20, 750 mg IV daily - will need weekly CK (to eval for rhabdo) and cbc w/ diff (watch for eosinophilic pneumonitis) and cmp while on dapto. Fax labs to [phone] - outpt f/u dr. Vickki Muff in 2 weeks - leave picc in place  after abx - f/u ID clinic one week  Hypokalemia - suspect secondary to diuretics Resolved - continue daily potassium 40 - would repeat bmp in 1 week  Hyponatremia - POA, chronic. Likely 2/2 thiazide as improving off thiazide, close to normalized off thiazide - hold hctz, monitor  Chronic systolic CHF / CAD / MR / TR - all appear stable.  --Continue ASA --cont home lasix  Hypotension POA / Hx of Hypertension -resumed amlodipine 2.5 mg, lasix -Held: home HCTZ  Hyperlipidemia -pravastatin 80 mg daily decreased to 20 qd  Idiopathic peripheral neuropathy -resumed gabapentin 1200 mg twice daily p.o.  Insomnia-melatonin 5 mg daily at bedtime  GERD -PPI  Procedures:  I & D left foot   Consultations:  Podiatry, ID  Discharge Exam: Vitals:   08/18/20 0422 08/18/20 0838  BP: (!) 143/78 132/76  Pulse: 71 66  Resp: 18 16  Temp: (!) 97.5 F (36.4 C)   SpO2: 96% 99%    General exam: awake, alert, no acute distress Respiratory system: symmetric chest rise, normal respiratory effort, on room air. Cardiovascular system: RRR, no RLE edema, LLE edema due to cellulitis.   Central nervous system: A&O x3. no gross focal neurologic deficits, normal speech Extremities: left foot wound vac applied, erythematous and edematous to lower calf, improving Psychiatry: normal mood, congruent affect, judgement and insight appear normal  Discharge Instructions   Discharge Instructions    Diet - low sodium heart healthy   Complete by: As directed    Diet - low sodium heart healthy   Complete by: As directed  Discharge wound care:   Complete by: As directed    Per home health rn   Discharge wound care:   Complete by: As directed    Change wound vac dressing Monday/Wednesday/friday   Increase activity slowly   Complete by: As directed    Increase activity slowly   Complete by: As directed    PR HEAVYDUTY/WIDE COMMODE CHAIR   Complete by: As directed    Drop arm      Allergies as of 08/18/2020      Reactions   Codeine Nausea And Vomiting   Indocin [indomethacin] Other (See Comments)   Latex Other (See Comments)   Blisters   Mupirocin    Blisters   Plavix [clopidogrel Bisulfate]    GI intolerance, n/v   Polysporin [bacitracin-polymyxin B]    Blisters   Shellfish Allergy    Tape    Neosporin [neomycin-bacitracin Zn-polymyx] Rash   Other Nausea And Vomiting   mussels      Medication List    STOP taking these medications   hydrochlorothiazide 25 MG tablet Commonly known as: HYDRODIURIL   losartan 50 MG tablet Commonly known as: COZAAR     TAKE these medications   Alpha Lipoic Acid 200 MG Caps Take 2 capsules by mouth daily.   amLODipine 2.5 MG tablet Commonly known as: NORVASC TAKE 1 TABLET BY MOUTH  DAILY   Apoaequorin 10 MG Caps Take 1 capsule by mouth as directed.   aspirin 81 MG tablet Take 162 mg by mouth at bedtime.   azelastine 0.1 % nasal spray Commonly known as: ASTELIN Place 1 spray into both nostrils 2 (two) times daily. Use in each nostril as directed   Biotin 1000 MCG tablet Take 1,000 mcg by mouth daily.   Calcium Carb-Cholecalciferol 500-100 MG-UNIT Chew Chew 500 mg by mouth daily.   cyanocobalamin 1000 MCG tablet Take 1,000 mcg by mouth daily.   daptomycin  IVPB Commonly known as: CUBICIN Inject 750 mg into the vein daily for 14 days. Indication:MRSA bacteremia with foot infection First Dose: Yes Last Day of Therapy:  08/29/2020 Labs - Once weekly on Monday:  CBC/D, CMP, CRP, ESR and CPK Keep PICC in place until seen by provider (may need to extend duration of therapy)  Method of administration: IV Push Method of administration may be changed at the discretion of home infusion pharmacist based upon assessment of the patient and/or caregiver's ability to self-administer the medication ordered.   diphenhydrAMINE 25 MG tablet Commonly known as: BENADRYL Take 25 mg by mouth at bedtime as needed for  allergies.   folic acid 1 MG tablet Commonly known as: FOLVITE Take 1 mg by mouth daily.   furosemide 40 MG tablet Commonly known as: LASIX Take 1 tablet (40 mg total) by mouth daily.   gabapentin 600 MG tablet Commonly known as: NEURONTIN TAKE 2 TABLETS BY MOUTH  TWICE DAILY   Glucosamine Sulfate 1000 MG Caps Take 1 capsule by mouth daily.   Lecithin 400 MG Caps Take 400 mg by mouth daily.   melatonin 5 MG Tabs Take 5 mg by mouth at bedtime.   Omega 3 1000 MG Caps Take 1,000 mg by mouth daily.   omeprazole 20 MG capsule Commonly known as: PRILOSEC TAKE 1 CAPSULE BY MOUTH  TWICE DAILY BEFORE MEALS What changed: See the new instructions.   potassium chloride SA 20 MEQ tablet Commonly known as: KLOR-CON Take 1 tablet (20 mEq total) by mouth 2 (two) times daily.   pravastatin  20 MG tablet Commonly known as: PRAVACHOL Take 4 tablets (80 mg total) by mouth daily. What changed: medication strength   primidone 50 MG tablet Commonly known as: MYSOLINE TAKE 1 TABLET BY MOUTH 3  TIMES DAILY What changed:   how much to take  when to take this  additional instructions   Selenium 200 MCG Caps Take 200 mcg by mouth daily.   tretinoin 0.05 % cream Commonly known as: RETIN-A Apply 1 application topically at bedtime.   triamcinolone 0.1 % Commonly known as: KENALOG Apply 1 application topically 2 (two) times daily. (apply to hands, legs and feet)   VITAMIN-B COMPLEX PO Take 1 tablet by mouth daily.            Durable Medical Equipment  (From admission, onward)         Start     Ordered   08/14/20 1135  DME 3-in-1  Once        08/14/20 1138   08/14/20 1135  DME standard manual wheelchair with seat cushion  Once       Comments: Patient suffers from lower extremity infection status post incision and drainage which impairs their ability to perform daily activities like bathing, dressing, feeding, grooming, and toileting in the home.  A walker will not resolve  issue with performing activities of daily living. A wheelchair will allow patient to safely perform daily activities. Patient can safely propel the wheelchair in the home or has a caregiver who can provide assistance. Length of need 6 months . Accessories: elevating leg rests (ELRs), wheel locks, extensions and anti-tippers.   08/14/20 1138           Discharge Care Instructions  (From admission, onward)         Start     Ordered   08/18/20 0000  Discharge wound care:       Comments: Change wound vac dressing Monday/Wednesday/friday   08/18/20 1006   08/14/20 0000  Discharge wound care:       Comments: Per home health rn   08/14/20 1138         Allergies  Allergen Reactions  . Codeine Nausea And Vomiting  . Indocin [Indomethacin] Other (See Comments)  . Latex Other (See Comments)    Blisters  . Mupirocin     Blisters  . Plavix [Clopidogrel Bisulfate]     GI intolerance, n/v  . Polysporin [Bacitracin-Polymyxin B]     Blisters  . Shellfish Allergy   . Tape   . Neosporin [Neomycin-Bacitracin Zn-Polymyx] Rash  . Other Nausea And Vomiting    mussels    Contact information for follow-up providers    Schedule an appointment as soon as possible for a visit with Samara Deist, DPM.   Specialty: Podiatry Why: Patient will need to make a follow up appointment. Contact information: [place] [address] [phone]        Tsosie Billing, MD.   Specialty: Infectious Diseases Why: call to schedule an appointment in 1 week..Patient will need to make a follow up appointment. Contact information: [place] [address] [phone]            Contact information for after-discharge care    Destination    [facility] Preferred SNF .   Service: Skilled Chiropodist information: [address] [place] [phone]  The results of significant diagnostics from this hospitalization (including imaging, microbiology, ancillary and laboratory) are listed below for reference.    Significant Diagnostic Studies: MR ANKLE LEFT W WO CONTRAST  Result Date: 08/09/2020 CLINICAL DATA:  Left leg swelling and redness, question of osteomyelitis EXAM: MRI OF THE LEFT ANKLE WITHOUT AND WITH CONTRAST TECHNIQUE: Multiplanar, multisequence MR imaging of the ankle was performed before and after the administration of intravenous contrast. CONTRAST:  6m GADAVIST GADOBUTROL 1 MMOL/ML IV SOLN COMPARISON:  Radiograph same day FINDINGS: Bones/Joint/Cartilage Osteoarthritis is seen at the first medial cuneiform metatarsal joint with joint space loss and subchondral cystic changes. Minimally increased signal seen within the lateral malleolus. No areas of cortical destruction or periosteal reaction. No areas of abnormal enhancement are seen throughout the osseous structures. Tibiotalar joint osteoarthritis is seen with joint space loss and diffuse chondral loss with subchondral marrow signal changes seen anteriorly. A small ankle joint effusion is seen. Ligaments Attenuation with increased signal seen within the anterior talofibular ligament. There is also slight thickening of the spring ligament, however it is intact. The remainder of the ligaments appear to be intact. Muscles and Tendons Mildly increased signal seen within the multiple on the plantar surface. No focal atrophy or tear however is noted. A small amount of fluid is seen surrounding the posterior tibialis and peroneal tendons, however they are intact. There is minimally increased signal seen at the insertion site of the Achilles tendon with mild adjacent reactive marrow. Soft tissue There is diffuse subcutaneous edema and skin thickening seen surrounding the ankle and dorsum of the hindfoot. No loculated fluid collections are noted. No areas of abnormal enhancement are seen.  IMPRESSION: IMPRESSION 1. Findings suggestive of diffuse cellulitis surrounding the ankle and hindfoot. No evidence of soft tissue abscess or osteomyelitis 2. Intrasubstance sprain and partial tear of the anterior talofibular ligament, however there are intact fibers throughout 3. Small ankle joint effusion 4. 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 Mild insertional Achilles tendinosis Electronically Signed   By: BPrudencio PairM.D.   On: 08/09/2020 22:03   [place]Venous Img Lower Unilateral Left  Result Date: 08/09/2020 CLINICAL DATA:  Left lower extremity edema and pain. EXAM: LEFT LOWER EXTREMITY VENOUS DOPPLER ULTRASOUND TECHNIQUE: Gray-scale sonography with compression, as well as color and duplex ultrasound, were performed to evaluate the deep venous system(s) from the level of the common femoral vein through the popliteal and proximal calf veins. COMPARISON:  None. FINDINGS: VENOUS Normal compressibility of the common femoral, superficial femoral, and popliteal veins, as well as the visualized calf veins. Visualized portions of profunda femoral vein and great saphenous vein unremarkable. No filling defects to suggest DVT on grayscale or color Doppler imaging. Doppler waveforms show normal direction of venous flow, normal respiratory plasticity and response to augmentation. Limited views of the contralateral common femoral vein are unremarkable. OTHER None. Limitations: none IMPRESSION: Negative. Electronically Signed   By: DFidela SalisburyM.D.   On: 08/09/2020 16:47   DG Chest Port 1 View  Result Date: 08/17/2020 CLINICAL DATA:  Left-sided rib pain. Lower leg swelling and cellulitis. EXAM: PORTABLE CHEST 1 VIEW COMPARISON:  09/15/2019. FINDINGS: Trachea is midline. Right PICC tip is in the SVC. Heart is enlarged, unchanged. Thoracic aorta is calcified. Lungs are clear. There may be tiny bilateral pleural effusions. IMPRESSION: 1. Question tiny bilateral pleural effusions. 2.   Aortic atherosclerosis (ICD10-I70.0). Electronically Signed   By: MLorin PicketM.D.   On: 08/17/2020 12:26   DG  Foot 2 Views Left  Result Date: 08/09/2020 CLINICAL DATA:  left leg redness EXAM: LEFT FOOT - 2 VIEW COMPARISON:  MRI left foot 02/17/2016 FINDINGS: First and second digit distal phalangeal amputation. No cortical erosion or destruction. There is no evidence of fracture or dislocation. There is no evidence of severe arthropathy or other focal bone abnormality. Subcutaneus soft tissue edema. Vascular calcifications. IMPRESSION: 1. 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 2. No acute displaced fracture or dislocation of the bones of the left foot. Electronically Signed   By: Iven Finn M.D.   On: 08/09/2020 18:30   ECHOCARDIOGRAM COMPLETE  Result Date: 08/12/2020    ECHOCARDIOGRAM REPORT   Patient Name:   KANNAN PROIA  Date of Exam: 08/10/2020 Medical Rec #:  [id number]  Height:       72.0 in Accession #:    [id number] Weight:       210.8 lb Date of Birth:  1935-07-29  BSA:          2.179 m Patient Age:    59 years   BP:           114/66 mmHg Patient Gender: M          HR:           80 bpm. Exam Location:  [facility] Procedure: 2D Echo, Color Doppler and Cardiac Doppler Indications:     R78.81 Bacteremia  History:         Patient has prior history of Echocardiogram examinations, most                  recent 04/23/2020. Prior CABG; Risk Factors:Hypertension and                  HCL.  Sonographer:     Charmayne Sheer RDCS (AE) Referring Phys:  [id number] Tsosie Billing Diagnosing Phys: Serafina Royals MD  Sonographer Comments: Technically difficult study due to poor echo windows. IMPRESSIONS  1. Left ventricular ejection fraction, by estimation, is 55 to 60%. The left ventricle has normal function. The left ventricle has no regional wall motion abnormalities. Left ventricular diastolic parameters were normal.  2. Right  ventricular systolic function is normal. The right ventricular size is mildly enlarged.  3. Left atrial size was mildly dilated.  4. Right atrial size was mildly dilated.  5. The mitral valve is normal in structure. Mild mitral valve regurgitation.  6. The aortic valve is normal in structure. Aortic valve regurgitation is not visualized. FINDINGS  Left Ventricle: Left ventricular ejection fraction, by estimation, is 55 to 60%. The left ventricle has normal function. The left ventricle has no regional wall motion abnormalities. The left ventricular internal cavity size was normal in size. There is  no left ventricular hypertrophy. Left ventricular diastolic parameters were normal. Right Ventricle: The right ventricular size is mildly enlarged. No increase in right ventricular wall thickness. Right ventricular systolic function is normal. Left Atrium: Left atrial size was mildly dilated. Right Atrium: Right atrial size was mildly dilated. Pericardium: There is no evidence of pericardial effusion. Mitral Valve: The mitral valve is normal in structure. Mild mitral valve regurgitation. MV peak gradient, 3.5 mmHg. The mean mitral valve gradient is 2.0 mmHg. Tricuspid Valve: The tricuspid valve is normal in structure. Tricuspid valve regurgitation is mild. Aortic Valve: The aortic valve is normal in structure. Aortic valve regurgitation is not visualized. Aortic valve mean gradient measures 4.0 mmHg. Aortic valve  peak gradient measures 8.0 mmHg. Aortic valve area, by VTI measures 3.53 cm. Pulmonic Valve: The pulmonic valve was normal in structure. Pulmonic valve regurgitation is not visualized. Aorta: The aortic root and ascending aorta are structurally normal, with no evidence of dilitation. IAS/Shunts: No atrial level shunt detected by color flow Doppler.  LEFT VENTRICLE PLAX 2D LVIDd:         4.80 cm     Diastology LVIDs:         3.80 cm     LV e' medial:    9.79 cm/s LV PW:         1.10 cm     LV E/e' medial:  6.6 LV  IVS:        1.00 cm     LV e' lateral:   12.00 cm/s LVOT diam:     2.40 cm     LV E/e' lateral: 5.4 LV SV:         90 LV SV Index:   41 LVOT Area:     4.52 cm  LV Volumes (MOD) LV vol d, MOD A4C: 68.1 ml LV vol s, MOD A4C: 27.8 ml LV SV MOD A4C:     68.1 ml RIGHT VENTRICLE RV Basal diam:  3.40 cm LEFT ATRIUM             Index       RIGHT ATRIUM           Index LA diam:        3.70 cm 1.70 cm/m  RA Area:     25.90 cm LA Vol (A2C):   40.3 ml 18.50 ml/m RA Volume:   90.00 ml  41.31 ml/m LA Vol (A4C):   26.1 ml 11.98 ml/m LA Biplane Vol: 33.1 ml 15.19 ml/m  AORTIC VALVE                   PULMONIC VALVE AV Area (Vmax):    3.59 cm    PV Vmax:       1.42 m/s AV Area (Vmean):   3.64 cm    PV Vmean:      95.300 cm/s AV Area (VTI):     3.53 cm    PV VTI:        0.242 m AV Vmax:           141.00 cm/s PV Peak grad:  8.1 mmHg AV Vmean:          96.200 cm/s PV Mean grad:  4.0 mmHg AV VTI:            0.255 m AV Peak Grad:      8.0 mmHg AV Mean Grad:      4.0 mmHg LVOT Vmax:         112.00 cm/s LVOT Vmean:        77.300 cm/s LVOT VTI:          0.199 m LVOT/AV VTI ratio: 0.78  AORTA Ao Root diam: 3.70 cm MITRAL VALVE MV Area (PHT): 3.87 cm    SHUNTS MV Area VTI:   3.56 cm    Systemic VTI:  0.20 m MV Peak grad:  3.5 mmHg    Systemic Diam: 2.40 cm MV Mean grad:  2.0 mmHg MV Vmax:       0.94 m/s MV Vmean:      60.2 cm/s MV Decel Time: 196 msec MV E velocity: 64.90 cm/s MV A velocity: 81.90 cm/s MV E/A ratio:  0.79 Bruce  Nehemiah Massed MD Electronically signed by Serafina Royals MD Signature Date/Time: 08/12/2020/1:15:43 PM    Final    ECHO TEE  Result Date: 08/13/2020    TRANSESOPHOGEAL ECHO REPORT   Patient Name:   TEHRAN RABENOLD  Date of Exam: 08/13/2020 Medical Rec #:  [id number]  Height:       72.0 in Accession #:    [id number] Weight:       216.0 lb Date of Birth:  08-05-1935  BSA:          2.202 m Patient Age:    85 years   BP:           110/59 mmHg Patient Gender: M          HR:           89 bpm. Exam Location:  [facility] Procedure:  Transesophageal Echo, Color Doppler, Cardiac Doppler and Saline            Contrast Bubble Study Indications:     Not listed on front of TEE check-in sheet  History:         Patient has prior history of Echocardiogram examinations, most                  recent 08/10/2020. Previous Myocardial Infarction; Risk                  Factors:Hypertension.  Sonographer:     Sherrie Sport RDCS (AE) Referring Phys:  [facility] Diagnosing Phys: Serafina Royals MD PROCEDURE: The transesophogeal probe was passed without difficulty through the esophogus of the patient. Sedation performed by performing physician. The patient developed no complications during the procedure. IMPRESSIONS  1. Left ventricular ejection fraction, by estimation, is 55 to 60%. The left ventricle has normal function. The left ventricle has no regional wall motion abnormalities.  2. Right ventricular systolic function is normal. The right ventricular size is normal.  3. Left atrial size was mildly dilated. No left atrial/left atrial appendage thrombus was detected.  4. The mitral valve is normal in structure. Mild mitral valve regurgitation.  5. The aortic valve is normal in structure. Aortic valve regurgitation is trivial.  6. There is Moderate (Grade III) layered plaque involving the transverse aorta and descending aorta.  7. Agitated saline contrast bubble study was negative, with no evidence of any interatrial shunt. FINDINGS  Left Ventricle: Left ventricular ejection fraction, by estimation, is 55 to 60%. The left ventricle has normal function. The left ventricle has no regional wall motion abnormalities. The left ventricular internal cavity size was small. Right Ventricle: The right ventricular size is normal. No increase in right ventricular wall thickness. Right ventricular systolic function is normal. Left Atrium: Left atrial size was mildly dilated. No left atrial/left atrial appendage thrombus was detected. Right Atrium: Right atrial size  was normal in size. Pericardium: There is no evidence of pericardial effusion. Mitral Valve: Calcified mitral leaflet tips and cords. The mitral valve is normal in structure. Mild mitral valve regurgitation. There is no evidence of mitral valve vegetation. Tricuspid Valve: The tricuspid valve is normal in structure. Tricuspid valve regurgitation is mild. There is no evidence of tricuspid valve vegetation. Aortic Valve: The aortic valve is normal in structure. Aortic valve regurgitation is trivial. There is no evidence of aortic valve vegetation. Pulmonic Valve: The pulmonic valve was normal in structure. Pulmonic valve regurgitation is trivial. Aorta: The aortic root and ascending aorta are structurally normal, with no evidence of dilitation. There is moderate (  Grade III) layered plaque involving the transverse aorta and descending aorta. IAS/Shunts: No atrial level shunt detected by color flow Doppler. Agitated saline contrast was given intravenously to evaluate for intracardiac shunting. Agitated saline contrast bubble study was negative, with no evidence of any interatrial shunt. There  is no evidence of a patent foramen ovale. There is no evidence of an atrial septal defect. Serafina Royals MD Electronically signed by Serafina Royals MD Signature Date/Time: 08/13/2020/5:23:59 PM    Final    [place] EKG SITE RITE  Result Date: 08/13/2020 If Site Rite image not attached, placement could not be confirmed due to current cardiac rhythm.   Microbiology: Recent Results (from the past 240 hour(s))  Blood culture (routine x 2)     Status: Abnormal   Collection Time: 08/09/20  3:49 PM   Specimen: BLOOD  Result Value Ref Range Status   Specimen Description   Final    BLOOD BLOOD LEFT FOREARM Performed at [facility], [address], [place], [place] [address]    Special Requests   Final    BOTTLES DRAWN AEROBIC AND ANAEROBIC Blood Culture adequate volume Performed at [facility], [address] [place], [place] [address]    Culture  Setup Time   Final    GRAM POSITIVE COCCI AEROBIC BOTTLE ONLY CRITICAL RESULT CALLED TO, READ BACK BY AND VERIFIED WITH: NATHAN BELUE ON 08/10/20 AT 4888 QSD Performed at [facility], [place] [address], [place], [place] [address]    Culture METHICILLIN RESISTANT STAPHYLOCOCCUS AUREUS (A)  Final   Report Status 08/12/2020 FINAL  Final   Organism ID, Bacteria METHICILLIN RESISTANT STAPHYLOCOCCUS AUREUS  Final      Susceptibility   Methicillin resistant staphylococcus aureus - MIC*    CIPROFLOXACIN >=8 RESISTANT Resistant     ERYTHROMYCIN <=0.25 SENSITIVE Sensitive     GENTAMICIN <=0.5 SENSITIVE Sensitive     OXACILLIN >=4 RESISTANT Resistant     TETRACYCLINE >=16 RESISTANT Resistant     VANCOMYCIN 1 SENSITIVE Sensitive     TRIMETH/SULFA <=10 SENSITIVE Sensitive     CLINDAMYCIN <=0.25 SENSITIVE Sensitive     RIFAMPIN <=0.5 SENSITIVE Sensitive     Inducible Clindamycin NEGATIVE Sensitive     * METHICILLIN RESISTANT STAPHYLOCOCCUS AUREUS  Blood Culture ID Panel (Reflexed)     Status: Abnormal   Collection Time: 08/09/20  3:49 PM  Result Value Ref Range Status   Enterococcus faecalis NOT DETECTED NOT DETECTED Final   Enterococcus Faecium NOT DETECTED NOT DETECTED Final   Listeria monocytogenes NOT DETECTED NOT DETECTED Final   Staphylococcus species DETECTED (A) NOT DETECTED Final    Comment: CRITICAL RESULT CALLED TO, READ BACK BY AND VERIFIED WITH: NATHAN BELUE ON 08/10/20 AT 5038 QSD    Staphylococcus aureus (BCID) DETECTED (A) NOT DETECTED Final    Comment: Methicillin (oxacillin)-resistant Staphylococcus aureus (MRSA). MRSA is predictably resistant to beta-lactam antibiotics (except ceftaroline). Preferred therapy is vancomycin unless clinically contraindicated. Patient requires contact precautions if  hospitalized. CRITICAL RESULT CALLED TO, READ BACK BY AND VERIFIED WITH: NATHAN BELUE ON 08/10/20 AT 0616 QSD    Staphylococcus  epidermidis NOT DETECTED NOT DETECTED Final   Staphylococcus lugdunensis NOT DETECTED NOT DETECTED Final   Streptococcus species NOT DETECTED NOT DETECTED Final   Streptococcus agalactiae NOT DETECTED NOT DETECTED Final   Streptococcus pneumoniae NOT DETECTED NOT DETECTED Final   Streptococcus pyogenes NOT DETECTED NOT DETECTED Final   A.calcoaceticus-baumannii NOT DETECTED NOT DETECTED Final   Bacteroides fragilis  NOT DETECTED NOT DETECTED Final   Enterobacterales NOT DETECTED NOT DETECTED Final   Enterobacter cloacae complex NOT DETECTED NOT DETECTED Final   Escherichia coli NOT DETECTED NOT DETECTED Final   Klebsiella aerogenes NOT DETECTED NOT DETECTED Final   Klebsiella oxytoca NOT DETECTED NOT DETECTED Final   Klebsiella pneumoniae NOT DETECTED NOT DETECTED Final   Proteus species NOT DETECTED NOT DETECTED Final   Salmonella species NOT DETECTED NOT DETECTED Final   Serratia marcescens NOT DETECTED NOT DETECTED Final   Haemophilus influenzae NOT DETECTED NOT DETECTED Final   Neisseria meningitidis NOT DETECTED NOT DETECTED Final   Pseudomonas aeruginosa NOT DETECTED NOT DETECTED Final   Stenotrophomonas maltophilia NOT DETECTED NOT DETECTED Final   Candida albicans NOT DETECTED NOT DETECTED Final   Candida auris NOT DETECTED NOT DETECTED Final   Candida glabrata NOT DETECTED NOT DETECTED Final   Candida krusei NOT DETECTED NOT DETECTED Final   Candida parapsilosis NOT DETECTED NOT DETECTED Final   Candida tropicalis NOT DETECTED NOT DETECTED Final   Cryptococcus neoformans/gattii NOT DETECTED NOT DETECTED Final   Meth resistant mecA/C and MREJ DETECTED (A) NOT DETECTED Final    Comment: CRITICAL RESULT CALLED TO, READ BACK BY AND VERIFIED WITH: NATHAN BELUE ON 08/10/20 AT 3329 QSD Performed at [facility], [address] [place], [place] [address]   Blood culture (routine x 2)     Status: Abnormal   Collection Time: 08/09/20  3:50 PM   Specimen: BLOOD  Result  Value Ref Range Status   Specimen Description   Final    BLOOD RIGHT ANTECUBITAL Performed at [facility], [address], [place], [place] [address]    Special Requests   Final    BOTTLES DRAWN AEROBIC AND ANAEROBIC Blood Culture adequate volume Performed at [facility], [address] [place], [place] [address]    Culture  Setup Time   Final    GRAM POSITIVE COCCI IN BOTH AEROBIC AND ANAEROBIC BOTTLES CRITICAL VALUE NOTED.  VALUE IS CONSISTENT WITH PREVIOUSLY REPORTED AND CALLED VALUE. Performed at [facility], [address] [place], [place] [address]    Culture (A)  Final    STAPHYLOCOCCUS AUREUS SUSCEPTIBILITIES PERFORMED ON PREVIOUS CULTURE WITHIN THE LAST 5 DAYS. Performed at [facility], [place] [address], [place], [place] [address]    Report Status 08/12/2020 FINAL  Final  Resp Panel by RT-PCR (Flu A&B, Covid) Nasopharyngeal Swab     Status: None   Collection Time: 08/09/20  4:10 PM   Specimen: Nasopharyngeal Swab; Nasopharyngeal(NP) swabs in vial transport medium  Result Value Ref Range Status   SARS Coronavirus 2 by RT PCR NEGATIVE NEGATIVE Final    Comment: (NOTE) SARS-CoV-2 target nucleic acids are NOT DETECTED.  The SARS-CoV-2 RNA is generally detectable in upper respiratory specimens during the acute phase of infection. The lowest concentration of SARS-CoV-2 viral copies this assay can detect is 138 copies/mL. A negative result does not preclude SARS-Cov-2 infection and should not be used as the sole basis for treatment or other patient management decisions. A negative result may occur with  improper specimen collection/handling, submission of specimen other than nasopharyngeal swab, presence of viral mutation(s) within the areas targeted by this assay, and inadequate number of viral copies(<138 copies/mL). A negative result must be combined with clinical observations, patient history, and epidemiological information. The  expected result is Negative.  Fact Sheet for Patients:  EntrepreneurPulse.com.au  Fact Sheet for Healthcare Providers:  IncredibleEmployment.be  This test is no t yet approved or cleared by the [place] and  has been authorized for detection and/or diagnosis of SARS-CoV-2 by FDA under an Emergency Use Authorization (EUA). This EUA will remain  in effect (meaning this test can be used) for the duration of the COVID-19 declaration under Section 564(b)(1) of the Act, 21 U.S.C.section 360bbb-3(b)(1), unless the authorization is terminated  or revoked sooner.       Influenza A by PCR NEGATIVE NEGATIVE Final   Influenza B by PCR NEGATIVE NEGATIVE Final    Comment: (NOTE) The Xpert Xpress SARS-CoV-2/FLU/RSV plus assay is intended as an aid in the diagnosis of influenza from Nasopharyngeal swab specimens and should not be used as a sole basis for treatment. Nasal washings and aspirates are unacceptable for Xpert Xpress SARS-CoV-2/FLU/RSV testing.  Fact Sheet for Patients: EntrepreneurPulse.com.au  Fact Sheet for Healthcare Providers: IncredibleEmployment.be  This test is not yet approved or cleared by the [place] FDA and has been authorized for detection and/or diagnosis of SARS-CoV-2 by FDA under an Emergency Use Authorization (EUA). This EUA will remain in effect (meaning this test can be used) for the duration of the COVID-19 declaration under Section 564(b)(1) of the Act, 21 U.S.C. section 360bbb-3(b)(1), unless the authorization is terminated or revoked.  Performed at [facility] [address] [place], [place] [address]   MRSA PCR Screening     Status: Abnormal   Collection Time: 08/10/20 10:50 AM   Specimen: Nasal Mucosa; Nasopharyngeal  Result Value Ref Range Status   MRSA by PCR POSITIVE (A) NEGATIVE Final    Comment:        The GeneXpert MRSA Assay (FDA approved for  NASAL specimens only), is one component of a comprehensive MRSA colonization surveillance program. It is not intended to diagnose MRSA infection nor to guide or monitor treatment for MRSA infections. RESULT CALLED TO, READ BACK BY AND VERIFIED WITH: Jonnie Kind 08/10/20 AT 1257 BY ACR Performed at [facility], [address] [place], [place] [address]   CULTURE, BLOOD (ROUTINE X 2) w Reflex to ID Panel     Status: None   Collection Time: 08/10/20 11:55 PM   Specimen: BLOOD  Result Value Ref Range Status   Specimen Description BLOOD BLOOD LEFT HAND  Final   Special Requests   Final    BOTTLES DRAWN AEROBIC AND ANAEROBIC Blood Culture adequate volume   Culture   Final    NO GROWTH 5 DAYS Performed at [facility], [address] [place], [place] [address]    Report Status 08/16/2020 FINAL  Final  CULTURE, BLOOD (ROUTINE X 2) w Reflex to ID Panel     Status: None   Collection Time: 08/10/20 11:55 PM   Specimen: BLOOD  Result Value Ref Range Status   Specimen Description BLOOD BLOOD RIGHT HAND  Final   Special Requests   Final    BOTTLES DRAWN AEROBIC AND ANAEROBIC Blood Culture adequate volume   Culture   Final    NO GROWTH 5 DAYS Performed at [facility], [address] [place], [place] [address]    Report Status 08/16/2020 FINAL  Final  Aerobic/Anaerobic Culture w Gram Stain (surgical/deep wound)     Status: None   Collection Time: 08/11/20  4:40 PM   Specimen: Wound; Tissue  Result Value Ref Range Status   Specimen Description   Final    WOUND Performed at [facility], [place], [place], [place]  [address]    Special Requests   Final    LEFT FOOT Performed at [facility], [place], [place] [address]    Gram Stain   Final    FEW WBC PRESENT,BOTH PMN AND MONONUCLEAR GRAM POSITIVE COCCI    Culture   Final    ABUNDANT METHICILLIN RESISTANT STAPHYLOCOCCUS AUREUS NO ANAEROBES  ISOLATED Performed at [facility], [place] [address], [place], [place] [address]    Report Status 08/16/2020 FINAL  Final   Organism ID, Bacteria METHICILLIN RESISTANT STAPHYLOCOCCUS AUREUS  Final      Susceptibility   Methicillin resistant staphylococcus aureus - MIC*    CIPROFLOXACIN >=8 RESISTANT Resistant     ERYTHROMYCIN <=0.25 SENSITIVE Sensitive     GENTAMICIN <=0.5 SENSITIVE Sensitive     OXACILLIN >=4 RESISTANT Resistant     TETRACYCLINE >=16 RESISTANT Resistant     VANCOMYCIN <=0.5 SENSITIVE Sensitive     TRIMETH/SULFA <=10 SENSITIVE Sensitive     CLINDAMYCIN <=0.25 SENSITIVE Sensitive     RIFAMPIN <=0.5 SENSITIVE Sensitive     Inducible Clindamycin NEGATIVE Sensitive     * ABUNDANT METHICILLIN RESISTANT STAPHYLOCOCCUS AUREUS  Aerobic/Anaerobic Culture w Gram Stain (surgical/deep wound)     Status: None   Collection Time: 08/12/20  1:38 PM   Specimen: Wound  Result Value Ref Range Status   Specimen Description   Final    WOUND Performed at [facility], [address], [place], [place] [address]    Special Requests   Final    NONE Performed at [facility], [address] [place], [place] [address]    Gram Stain   Final    NO WBC SEEN RARE GRAM POSITIVE COCCI IN CLUSTERS    Culture   Final    FEW METHICILLIN RESISTANT STAPHYLOCOCCUS AUREUS NO ANAEROBES ISOLATED Performed at [facility], [place] [address], [place],  [address]    Report Status 08/17/2020 FINAL  Final   Organism ID, Bacteria METHICILLIN RESISTANT STAPHYLOCOCCUS AUREUS  Final      Susceptibility   Methicillin resistant staphylococcus aureus - MIC*    CIPROFLOXACIN 4 RESISTANT Resistant     ERYTHROMYCIN <=0.25 SENSITIVE Sensitive     GENTAMICIN <=0.5 SENSITIVE Sensitive     OXACILLIN >=4 RESISTANT Resistant     TETRACYCLINE >=16 RESISTANT Resistant     VANCOMYCIN 1 SENSITIVE Sensitive     TRIMETH/SULFA <=10 SENSITIVE Sensitive     CLINDAMYCIN <=0.25 SENSITIVE  Sensitive     RIFAMPIN <=0.5 SENSITIVE Sensitive     Inducible Clindamycin NEGATIVE Sensitive     * FEW METHICILLIN RESISTANT STAPHYLOCOCCUS AUREUS  SARS CORONAVIRUS 2 (TAT 6-24 HRS) Nasopharyngeal Nasopharyngeal Swab     Status: None   Collection Time: 08/17/20  4:22 PM   Specimen: Nasopharyngeal Swab  Result Value Ref Range Status   SARS Coronavirus 2 NEGATIVE NEGATIVE Final    Comment: (NOTE) SARS-CoV-2 target nucleic acids are NOT DETECTED.  The SARS-CoV-2 RNA is generally detectable in upper and lower respiratory specimens during the acute phase of infection. Negative results do not preclude SARS-CoV-2 infection, do not rule out co-infections with other pathogens, and should not be used as the sole basis for treatment or other patient management decisions. Negative results must be combined with clinical observations, patient history, and epidemiological information. The expected result is Negative.  Fact Sheet for Patients: SugarRoll.be  Fact Sheet for Healthcare Providers: https://www.woods-mathews.com/  This test is not yet approved or cleared  by the [place] and  has been authorized for detection and/or diagnosis of SARS-CoV-2 by FDA under an Emergency Use Authorization (EUA). This EUA will remain  in effect (meaning this test can be used) for the duration of the COVID-19 declaration under Se ction 564(b)(1) of the Act, 21 U.S.C. section 360bbb-3(b)(1), unless the authorization is terminated or revoked sooner.  Performed at [facility], [place] [address], [place], [place] [address]      Labs: Basic Metabolic Panel: Recent Labs  Lab 08/14/20 0426 08/15/20 0541 08/16/20 0600 08/17/20 0500 08/18/20 0801  NA 133* 134* 134* 134* 134*  K 3.7 3.5 3.9 3.7 3.5  CL 100 99 99 98 98  CO2 '25 26 27 27 27  ' GLUCOSE 96 92 104* 99 94  BUN '15 12 14 15 16  ' CREATININE 0.81 0.77 0.77 0.73 0.79  CALCIUM 9.0 9.5 9.4 9.2 9.2   MG 1.7 1.6* 1.7 1.7 1.7   Liver Function Tests: No results for input(s): AST, ALT, ALKPHOS, BILITOT, PROT, ALBUMIN in the last 168 hours. No results for input(s): LIPASE, AMYLASE in the last 168 hours. No results for input(s): AMMONIA in the last 168 hours. CBC: Recent Labs  Lab 08/12/20 0525 08/13/20 0715 08/14/20 0426 08/15/20 0541 08/16/20 0600  WBC 8.6 5.6 5.4 5.4 6.7  HGB 10.0* 10.0* 9.9* 10.9* 11.0*  HCT 28.5* 28.6* 28.1* 30.9* 30.8*  MCV 104.0* 103.6* 103.7* 102.7* 103.0*  PLT 226 247 270 293 310   Cardiac Enzymes: Recent Labs  Lab 08/14/20 0426  CKTOTAL 23*   BNP: BNP (last 3 results) Recent Labs    04/22/20 1557  BNP 233.9*    ProBNP (last 3 results) No results for input(s): PROBNP in the last 8760 hours.  CBG: Recent Labs  Lab 08/12/20 0928 08/17/20 0807  GLUCAP 121* 91       Signed:  Desma Maxim MD.  [facility] 08/18/2020, 10:08 AM

## 2020-08-19 DIAGNOSIS — L02619 Cutaneous abscess of unspecified foot: Secondary | ICD-10-CM

## 2020-08-20 ENCOUNTER — Ambulatory Visit: Payer: Medicare Other | Admitting: Physician Assistant

## 2020-08-20 ENCOUNTER — Inpatient Hospital Stay: Payer: Medicare Other | Admitting: Infectious Diseases

## 2020-08-24 ENCOUNTER — Telehealth: Payer: Self-pay | Admitting: Family Medicine

## 2020-08-24 NOTE — Telephone Encounter (Signed)
Wife states pt has been transferred to [facility].  Wife states pt is not getting the care he needs. Pt not getting water, hot coffee, not emptying his urinal. Pt was put on the bed side commode.  It took an hour for them to come back and help him off.  Wife states she is running herself ragged going back and forth just so she can get him water. Wife states he sees Dr Vickki Muff on Wed at 79 am.  Wife is hoping pt can come home so she can take care of him.  She doesn't want him to spend another weekend like he had this weekend. She states nurse, PT and OT can come to their home.  She states she can take care of him there. They have a ramp at their home.

## 2020-08-24 NOTE — Telephone Encounter (Signed)
I attempted to contact the patient again, no answer. LMOM to return my call.

## 2020-08-24 NOTE — Telephone Encounter (Signed)
Please notify patient's wife that we understand her concerns and hopeful that his care can improve. Hopefully appointment goes well on Wednesday.  I do have one major concern however, that most [name] in this region have been at max capacity and not taking new patients, even patient's discharged from hospital.  I am afraid that if he still needed help at home, it might be very difficult to arrange home health right now.  [name], [facility] 08/24/2020, 11:53 AM

## 2020-08-24 NOTE — Telephone Encounter (Signed)
I attempted to contact the patient wife, no answer. Left message on the patient wife vm to return my call.

## 2020-08-25 ENCOUNTER — Telehealth: Payer: Self-pay

## 2020-08-25 NOTE — Telephone Encounter (Signed)
Copied from [place] [phone]. Topic: General - Other >> Aug 25, 2020  3:26 PM Tessa Lerner A wrote: Reason for CRM: Patient's wife has made call to notify PCP that patient patient has not received antibiotics while at [facility] since Friday 08/21/20  She states she has been going daily to giver him a liter of seltzer water so he is staying hydrated..  She said that he has an appt to see Dr Vickki Muff tomorrow to check on his heel (which is why he's on antibiotics) and I told her to call me tomorrow afternoon to let us know what he says about making sure he finishes up the antibiotics.   She said that she had just spoken with his office and they are aware of the situation.

## 2020-08-25 NOTE — Telephone Encounter (Signed)
Patients wife called stating [facility] ha snot been giving him the PICC LINE Abx and said it costs $80 to give. Wife offered to pay out of pocket. Discharge summary orders 08/18/20 clearly state IV PICC to be given through 3/23. I called [facility] and was told by clinical staff that he is receiving these meds. I called [name] and she stated that he did not get Sat or Sun meds and she knows he did not get them. She said he was told that the pharmacy did not have the medication by the nursing staff.'Left VM with DON at [facility] as did Dr Delaine Lame.

## 2020-08-26 NOTE — Telephone Encounter (Signed)
The pt was notified of Dr. Raliegh Ip message by Raquel Sarna. Please see the previous message.

## 2020-08-27 ENCOUNTER — Telehealth: Payer: Self-pay

## 2020-08-27 NOTE — Telephone Encounter (Signed)
[facility] called and stated patient is asking to be discharged home. I explained we called regarding his missed doses and she is going to confirm but she said it does look as though they had no meds to administer on 3/15 and 3/16. She will call us back to confirm or send that info with him on his visit Tuesday. She also said no one has drawn labs but she did see orders and did verify my fax number. I explained we need labs before his visit Tuesday. I did advise that we usually do not make the decisions for discharge and that I would wait until we see him and wait until we know what all medications he did miss. I am sure we should see lab results also. She will call back with exact missed doses and will make sure he is aware of appt on 09/01/20.

## 2020-08-28 ENCOUNTER — Telehealth: Payer: Self-pay

## 2020-08-28 NOTE — Telephone Encounter (Signed)
Patient's wife has called to notify practice that patient will be released from [facility] 08/29/20 by 12:00 PM Patient's wife shares that they are appreciative of all that staff has done for them

## 2020-08-28 NOTE — Telephone Encounter (Signed)
The patient wife called all upset complaining that her husband cannot stay another night at [facility]. She said the care is horrible and they are not doing anything to help him. He is not getting his antibiotic as prescribed. He suppose to come home tomorrow, but they will not tell her when they are going to release him. She said if they try to keep him one more night she is going to go take him out of there on her own. She also stated if they try to stop her she is going to call the police.

## 2020-08-28 NOTE — Telephone Encounter (Signed)
Copied from [place] [id number]. Topic: General - Other >> Aug 25, 2020  3:26 PM Tessa Lerner A wrote: Reason for CRM: Patient's wife has made call to notify PCP that patient patient has not received antibiotics while at [facility] since Friday 08/21/20  Patient's wife also shares that patient has not been receiving water regularly.  Patient's wife would like to be contacted by staff as well as a Education officer, museum to discuss further  Please contact to advise >> Aug 28, 2020 12:24 PM Keene Breath wrote: Patient's wife is calling again regarding his care at [facility].  She would like to have him transferred so he can get home care.  She feels that the services are not good where he  is currently. At.  She would like the doctor to explain what she needs to do to get this done.  Wife feels it is totally inadequate.  Patient is scheduled to be home tomorrow afternoon.  Please call wife to discuss further at [phone].  If no one answers please leave a message.

## 2020-08-31 ENCOUNTER — Telehealth: Payer: Self-pay | Admitting: Family Medicine

## 2020-08-31 NOTE — Telephone Encounter (Signed)
[name] PT, with [facility] is calling to place orders for PT. With Frequency- 2 times a week for 3 weeks, 1 time a week for 5 weeks. Please advise [phone] Ok to leave verbal on VM

## 2020-09-01 ENCOUNTER — Encounter: Payer: Self-pay | Admitting: Infectious Diseases

## 2020-09-01 ENCOUNTER — Other Ambulatory Visit
Admission: RE | Admit: 2020-09-01 | Discharge: 2020-09-01 | Disposition: A | Discharge status: Home / Self Care | Payer: Medicare Other | Source: Ambulatory Visit | Attending: Infectious Diseases | Admitting: Infectious Diseases

## 2020-09-01 ENCOUNTER — Ambulatory Visit: Payer: Medicare Other | Attending: Infectious Diseases | Admitting: Infectious Diseases

## 2020-09-01 ENCOUNTER — Other Ambulatory Visit: Payer: Self-pay

## 2020-09-01 VITALS — BP 117/76 | HR 79 | Temp 98.2°F

## 2020-09-01 DIAGNOSIS — Z792 Long term (current) use of antibiotics: Secondary | ICD-10-CM | POA: Diagnosis not present

## 2020-09-01 DIAGNOSIS — G9009 Other idiopathic peripheral autonomic neuropathy: Secondary | ICD-10-CM | POA: Diagnosis not present

## 2020-09-01 DIAGNOSIS — Z885 Allergy status to narcotic agent status: Secondary | ICD-10-CM | POA: Insufficient documentation

## 2020-09-01 DIAGNOSIS — Z95828 Presence of other vascular implants and grafts: Secondary | ICD-10-CM | POA: Insufficient documentation

## 2020-09-01 DIAGNOSIS — B9562 Methicillin resistant Staphylococcus aureus infection as the cause of diseases classified elsewhere: Secondary | ICD-10-CM | POA: Diagnosis present

## 2020-09-01 DIAGNOSIS — G629 Polyneuropathy, unspecified: Secondary | ICD-10-CM | POA: Insufficient documentation

## 2020-09-01 DIAGNOSIS — Z888 Allergy status to other drugs, medicaments and biological substances status: Secondary | ICD-10-CM | POA: Insufficient documentation

## 2020-09-01 DIAGNOSIS — R7881 Bacteremia: Secondary | ICD-10-CM | POA: Diagnosis present

## 2020-09-01 DIAGNOSIS — I2510 Atherosclerotic heart disease of native coronary artery without angina pectoris: Secondary | ICD-10-CM | POA: Diagnosis not present

## 2020-09-01 DIAGNOSIS — L089 Local infection of the skin and subcutaneous tissue, unspecified: Secondary | ICD-10-CM | POA: Diagnosis not present

## 2020-09-01 DIAGNOSIS — Z951 Presence of aortocoronary bypass graft: Secondary | ICD-10-CM | POA: Diagnosis not present

## 2020-09-01 DIAGNOSIS — Z79899 Other long term (current) drug therapy: Secondary | ICD-10-CM | POA: Diagnosis not present

## 2020-09-01 DIAGNOSIS — Z7982 Long term (current) use of aspirin: Secondary | ICD-10-CM | POA: Diagnosis not present

## 2020-09-01 LAB — COMPREHENSIVE METABOLIC PANEL
ALT: 14 U/L (ref 0–44)
AST: 16 U/L (ref 15–41)
Albumin: 3.6 g/dL (ref 3.5–5.0)
Alkaline Phosphatase: 60 U/L (ref 38–126)
Anion gap: 10 (ref 5–15)
BUN: 23 mg/dL (ref 8–23)
CO2: 25 mmol/L (ref 22–32)
Calcium: 9.2 mg/dL (ref 8.9–10.3)
Chloride: 99 mmol/L (ref 98–111)
Creatinine, Ser: 0.9 mg/dL (ref 0.61–1.24)
GFR, Estimated: >60 mL/min (ref >60)
Glucose, Bld: 112 mg/dL — ABNORMAL HIGH (ref 70–99)
Potassium: 3.7 mmol/L (ref 3.5–5.1)
Sodium: 134 mmol/L — ABNORMAL LOW (ref 135–145)
Total Bilirubin: 0.5 mg/dL (ref 0.3–1.2)
Total Protein: 7.5 g/dL (ref 6.5–8.1)

## 2020-09-01 LAB — CBC WITH DIFFERENTIAL/PLATELET
Abs Immature Granulocytes: 0.02 10*3/uL (ref 0.00–0.07)
Basophils Absolute: 0.1 10*3/uL (ref 0.0–0.1)
Basophils Relative: 1 %
Eosinophils Absolute: 0.4 10*3/uL (ref 0.0–0.5)
Eosinophils Relative: 7 %
HCT: 34.1 % — ABNORMAL LOW (ref 39.0–52.0)
Hemoglobin: 11.7 g/dL — ABNORMAL LOW (ref 13.0–17.0)
Immature Granulocytes: 0 %
Lymphocytes Relative: 21 %
Lymphs Abs: 1.3 10*3/uL (ref 0.7–4.0)
MCH: 35 pg — ABNORMAL HIGH (ref 26.0–34.0)
MCHC: 34.3 g/dL (ref 30.0–36.0)
MCV: 102.1 fL — ABNORMAL HIGH (ref 80.0–100.0)
Monocytes Absolute: 0.5 10*3/uL (ref 0.1–1.0)
Monocytes Relative: 9 %
Neutro Abs: 3.7 10*3/uL (ref 1.7–7.7)
Neutrophils Relative %: 62 %
Platelets: 297 10*3/uL (ref 150–400)
RBC: 3.34 MIL/uL — ABNORMAL LOW (ref 4.22–5.81)
RDW: 12.1 % (ref 11.5–15.5)
WBC: 6 10*3/uL (ref 4.0–10.5)
nRBC: 0 % (ref 0.0–0.2)

## 2020-09-01 LAB — SEDIMENTATION RATE: Sed Rate: 65 mm/hr — ABNORMAL HIGH (ref 0–20)

## 2020-09-01 LAB — C-REACTIVE PROTEIN: CRP: 1.4 mg/dL — ABNORMAL HIGH (ref <1.0)

## 2020-09-01 LAB — CK: Total CK: 48 U/L — ABNORMAL LOW (ref 49–397)

## 2020-09-01 MED ORDER — LINEZOLID 600 MG PO TABS: 600.0000 mg | ORAL_TABLET | Freq: Two times a day (BID) | ORAL | 0 refills | Status: DC

## 2020-09-01 NOTE — Telephone Encounter (Signed)
Yes, please notify them we can authorize the home health orders to proceed  [name], [facility] 09/01/2020, 2:02 PM

## 2020-09-01 NOTE — Patient Instructions (Signed)
You are here for follow up of left foot infection and MRSA bacteremia- you have been on Iv daptomycin since 08/13/20- You went home from [facility] on 08/29/20. Today we will do some labs- you complete antibiotic IV on 3/25- will give linezolid 600mg  PO BID for 10-14 days after

## 2020-09-01 NOTE — Progress Notes (Signed)
NAME: [name]  DOB: May 05, 1936  MRN: [id number]  Date/Time: 09/01/2020 12:11 PM  Subjective:   ?pt here with his friend who is his care taker Meldrick Buttery is a 85 y.o. male is here for follow up after recent hospitalization for MRSA bacteremia and MRSA foot infection Pt was in [facility] between 2/27 until 08/18/20 for MRSA bacteremia and ;eft foot infection- HE underwent abscess drainage and debridement of left foot infection on 08/12/20. Wound vac placed and [facility] was [facility] on Daptomycin. HE was discharged home on 08/29/20- pt said [facility] did not give a few days of IV antibiotic- On calling them and speaking with the nursing superintendent- on 3/11 and 3/18 there was no signature of the nurse to say it was given, and it was an agency nurse and she is going to verify with her. Pt saw Dr.Fowler post discharge and sutures have been removed 3/24 is the last day of antibiotic   Past Medical History:  Diagnosis Date  . Arthritis   . Benign essential tremor   . Fracture, femur ([place]) 2012  . GERD (gastroesophageal reflux disease)   . H/O echocardiogram    05/2013  . Hx of colonoscopy    04/22/2013  . Hypercholesterolemia   . Hypertension   . Myocardial infarction ([place])    1987  . Polyneuropathy   . Prostate cancer ([place]) 2004  . Superficial hematoma     Past Surgical History:  Procedure Laterality Date  . APPLICATION OF WOUND VAC Left 08/12/2020   Procedure: APPLICATION OF WOUND VAC;  Surgeon: Samara Deist, DPM;  Location: [facility] ORS;  Service: Podiatry;  Laterality: Left;  . BASAL CELL CARCINOMA EXCISION    . COLONOSCOPY  05/01/2009   Positive for colonic polyps  . COLONOSCOPY WITH PROPOFOL N/A 02/24/2016   Procedure: COLONOSCOPY WITH PROPOFOL;  Surgeon: Christene Lye, MD;  Location: [facility] ENDOS[facility];  Service: Endoscopy;  Laterality: N/A;  . CORONARY ARTERY BYPASS GRAFT  1987   X2  . HERNIA REPAIR  1990  . PROSTATE SURGERY  2004  . TEE WITHOUT CARDIOVERSION N/A 08/13/2020    Procedure: TRANSESOPHAGEAL ECHOCARDIOGRAM (TEE);  Surgeon: Corey Skains, MD;  Location: [facility] ORS;  Service: Cardiovascular;  Laterality: N/A;  . WOUND DEBRIDEMENT Left 08/12/2020   Procedure: DEBRIDEMENT WOUND-Left Heel;  Surgeon: Samara Deist, DPM;  Location: [facility] OR[facility];  Service: Podiatry;  Laterality: Left;    Social History   Socioeconomic History  . Marital status: Married    Spouse name: Not on file  . Number of children: Not on file  . Years of education: Not on file  . Highest education level: Not on file  Occupational History  . Not on file  Tobacco Use  . Smoking status: Never Smoker  . Smokeless tobacco: Never Used  Vaping Use  . Vaping Use: Never used  Substance and Sexual Activity  . Alcohol use: Yes    Alcohol/week: 7.0 standard drinks    Types: 7 Glasses of wine per week    Comment: wine in the evening with dinner  . Drug use: No  . Sexual activity: Not on file  Other Topics Concern  . Not on file  Social History Narrative  . Not on file   Social Determinants of Health   Financial Resource Strain: Not on file  Food Insecurity: Not on file  Transportation Needs: Not on file  Physical Activity: Not on file  Stress: Not on file  Social Connections: Not on file  Intimate Partner Violence: Not on file    Family History  Problem Relation Age of Onset  . Heart disease Mother   . Congestive Heart Failure Father    Allergies  Allergen Reactions  . Codeine Nausea And Vomiting  . Indocin [Indomethacin] Other (See Comments)  . Latex Other (See Comments)    Blisters  . Mupirocin     Blisters  . Plavix [Clopidogrel Bisulfate]     GI intolerance, n/v  . Polysporin [Bacitracin-Polymyxin B]     Blisters  . Shellfish Allergy   . Tape   . Neosporin [Neomycin-Bacitracin Zn-Polymyx] Rash  . Other Nausea And Vomiting    mussels   I? Current Outpatient Medications  Medication Sig Dispense Refill  . Alpha Lipoic Acid 200 MG CAPS Take 2 capsules by mouth  daily.    Marland Kitchen amLODipine (NORVASC) 2.5 MG tablet TAKE 1 TABLET BY MOUTH  DAILY (Patient taking differently: Take 2.5 mg by mouth daily.) 90 tablet 3  . Apoaequorin 10 MG CAPS Take 1 capsule by mouth as directed.    Marland Kitchen aspirin 81 MG tablet Take 162 mg by mouth at bedtime.    Marland Kitchen azelastine (ASTELIN) 0.1 % nasal spray Place 1 spray into both nostrils 2 (two) times daily. Use in each nostril as directed 30 mL 5  . B Complex Vitamins (VITAMIN-B COMPLEX PO) Take 1 tablet by mouth daily.    . Biotin 1000 MCG tablet Take 1,000 mcg by mouth daily.    . Calcium Carb-Cholecalciferol 500-100 MG-UNIT CHEW Chew 500 mg by mouth daily.    . cyanocobalamin 1000 MCG tablet Take 1,000 mcg by mouth daily.    . diphenhydrAMINE (BENADRYL) 25 MG tablet Take 25 mg by mouth at bedtime as needed for allergies.    . folic acid (FOLVITE) 1 MG tablet Take 1 mg by mouth daily.    . furosemide (LASIX) 40 MG tablet Take 1 tablet (40 mg total) by mouth daily. 30 tablet 2  . gabapentin (NEURONTIN) 600 MG tablet TAKE 2 TABLETS BY MOUTH  TWICE DAILY (Patient taking differently: Take 1,200 mg by mouth 2 (two) times daily.) 360 tablet 3  . Glucosamine Sulfate 1000 MG CAPS Take 1 capsule by mouth daily.    . Lecithin 400 MG CAPS Take 400 mg by mouth daily.    . melatonin 5 MG TABS Take 5 mg by mouth at bedtime.    . Omega 3 1000 MG CAPS Take 1,000 mg by mouth daily.    Marland Kitchen omeprazole (PRILOSEC) 20 MG capsule TAKE 1 CAPSULE BY MOUTH  TWICE DAILY BEFORE MEALS (Patient taking differently: Take 20 mg by mouth 2 (two) times daily before a meal.) 180 capsule 3  . potassium chloride SA (KLOR-CON) 20 MEQ tablet Take 1 tablet (20 mEq total) by mouth 2 (two) times daily. 180 tablet 3  . pravastatin (PRAVACHOL) 20 MG tablet Take 4 tablets (80 mg total) by mouth daily.    . primidone (MYSOLINE) 50 MG tablet TAKE 1 TABLET BY MOUTH 3  TIMES DAILY (Patient taking differently: Take 50-100 mg by mouth See admin instructions. Take 2 tablets (100mg ) by mouth  every morning and take 1 tablet (50mg ) by mouth every night) 270 tablet 3  . Selenium 200 MCG CAPS Take 200 mcg by mouth daily.    Marland Kitchen tretinoin (RETIN-A) 0.05 % cream Apply 1 application topically at bedtime.    . triamcinolone (KENALOG) 0.1 % Apply 1 application topically 2 (two) times daily. (apply to hands, legs  and feet)     No current facility-administered medications for this visit.     Abtx:  Anti-infectives (From admission, onward)   None      REVIEW OF SYSTEMS:  Const: negative fever, negative chills, negative weight loss Eyes: negative diplopia or visual changes, negative eye pain ENT: negative coryza, negative sore throat Resp: negative cough, hemoptysis, dyspnea Cards: negative for chest pain, palpitations, lower extremity edema GU: negative for frequency, dysuria and hematuria GI: Negative for abdominal pain, diarrhea, bleeding, constipation Skin: negative for rash and pruritus Heme: negative for easy bruising and gum/nose bleeding MS: weakness  Neurolo:neuropathy hands and feet Psych: negative for feelings of anxiety, depression  Endocrine: negative for thyroid, diabetes Allergy/Immunology- as above ?  Objective:  VITALS:  BP 117/76   Pulse 79   Temp 98.2 F (36.8 C) (Oral)  PHYSICAL EXAM:  General: Alert, cooperative, no distress, appears stated age. In wheel chair Head: Normocephalic, without obvious abnormality, atraumatic. Eyes: Conjunctivae clear, anicteric sclerae. Pupils are equal ENT Nares normal. No drainage or sinus tenderness. Lips, mucosa, and tongue normal. No Thrush Neck: Supple, symmetrical, no adenopathy, thyroid: non tender no carotid bruit and no JVD. Back: No CVA tenderness. Lungs: Clear to auscultation bilaterally. No Wheezing or Rhonchi. No rales. Heart: Regular rate and rhythm, no murmur, rub or gallop. Abdomen: Soft, non-tender,not distended. Bowel sounds normal. No masses Extremities: left foot dressing changed       Rt  lateral wound  Callus over heel  Skin: No rashes or lesions. Or bruising Lymph: Cervical, supraclavicular N  CNS claw hands  Pertinent Labs Lab Results CBC    Component Value Date/Time   WBC 6.7 08/16/2020 0600   RBC 2.99 (L) 08/16/2020 0600   HGB 11.0 (L) 08/16/2020 0600   HGB 12.7 09/29/2015 0845   HCT 30.8 (L) 08/16/2020 0600   HCT 37.9 09/29/2015 0845   PLT 310 08/16/2020 0600   PLT 263 09/29/2015 0845   MCV 103.0 (H) 08/16/2020 0600   MCV 105 (H) 09/29/2015 0845   MCV 107 (H) 01/04/2012 1053   MCH 36.8 (H) 08/16/2020 0600   MCHC 35.7 08/16/2020 0600   RDW 12.2 08/16/2020 0600   RDW 13.6 09/29/2015 0845   RDW 13.2 01/04/2012 1053   LYMPHSABS 0.4 (L) 08/09/2020 1551   LYMPHSABS 1.4 09/29/2015 0845   LYMPHSABS 1.2 01/04/2012 1053   MONOABS 0.7 08/09/2020 1551   MONOABS 0.4 01/04/2012 1053   EOSABS 0.0 08/09/2020 1551   EOSABS 0.1 09/29/2015 0845   EOSABS 0.1 01/04/2012 1053   BASOSABS 0.0 08/09/2020 1551   BASOSABS 0.0 09/29/2015 0845   BASOSABS 0.0 01/04/2012 1053    CMP Latest Ref Rng & Units 08/18/2020 08/17/2020 08/16/2020  Glucose 70 - 99 mg/dL 94 99 104(H)  BUN 8 - 23 mg/dL 16 15 14   Creatinine 0.61 - 1.24 mg/dL 0.79 0.73 0.77  Sodium 135 - 145 mmol/L 134(L) 134(L) 134(L)  Potassium 3.5 - 5.1 mmol/L 3.5 3.7 3.9  Chloride 98 - 111 mmol/L 98 98 99  CO2 22 - 32 mmol/L 27 27 27   Calcium 8.9 - 10.3 mg/dL 9.2 9.2 9.4  Total Protein 6.5 - 8.1 g/dL - - -  Total Bilirubin 0.3 - 1.2 mg/dL - - -  Alkaline Phos 38 - 126 U/L - - -  AST 15 - 41 U/L - - -  ALT 0 - 44 U/L - - -     ? Impression/Recommendation ? ?MRSA bacteremia secondary to MRSA left foot infection.  Status post debridement of the abscess.  Left ankle joint was not involved by the infection.  TEE was negative.  Repeat cultures were negative.  He is on daptomycin and will be completing 4 weeks on 09/03/2020. PICC line will be removed after that.  We will give him linezolid 600 mg p.o. twice daily for 2  weeks. We will check labs while on linezolid. Also gave information about the side effects of linezolid and food and medications to avoid.  CAD status post CABG On Pravachol and aspirin  Peripheral neuropathy  Discussed with the nursing superintendent at [facility] regarding irregularities with lab draw as well as faxing to [place], for dressing change of the PICC line, ?  We will do labs today. ___________________________________________________ Discussed with patient, and his friend. Discussed with Dr. Vickki Muff. Note:  This document was prepared using Dragon voice recognition software and may include unintentional dictation errors.

## 2020-09-02 NOTE — Telephone Encounter (Signed)
Verbal given, left on voicemail

## 2020-09-04 ENCOUNTER — Telehealth: Payer: Self-pay

## 2020-09-04 ENCOUNTER — Other Ambulatory Visit: Payer: Self-pay | Admitting: Infectious Diseases

## 2020-09-04 NOTE — Telephone Encounter (Signed)
Lauren with [facility] reached out to office to confirm orders to pull picc. Last dose was on 3/24. Per MD okay to pull picc. Left voicemail with order to pull picc today.  Advised she call [facility] ID to confirm orders received.

## 2020-09-14 ENCOUNTER — Other Ambulatory Visit: Payer: Self-pay | Admitting: Infectious Diseases

## 2020-09-14 ENCOUNTER — Ambulatory Visit: Payer: Self-pay

## 2020-09-14 NOTE — Telephone Encounter (Signed)
Pt. States he started having neck and shoulder pain yesterday. Hurts when he moves his head. Pain with movement is 7-8/10. Requests virtual visit because "we have to arrange transportation and it takes awhile." Virtual visit for tomorrow.  Reason for Disposition . [1] MODERATE neck pain (e.g., interferes with normal activities AND [2] present > 3 days  Answer Assessment - Initial Assessment Questions 1. ONSET: "When did the pain begin?"      Yesterday 2. LOCATION: "Where does it hurt?"      Neck and shoulders 3. PATTERN "Does the pain come and go, or has it been constant since it started?"      Comes and goes 4. SEVERITY: "How bad is the pain?"  (Scale 1-10; or mild, moderate, severe)   - NO PAIN (0): no pain or only slight stiffness    - MILD (1-3): doesn't interfere with normal activities    - MODERATE (4-7): interferes with normal activities or awakens from sleep    - SEVERE (8-10):  excruciating pain, unable to do any normal activities      Now - 7-8 5. RADIATION: "Does the pain go anywhere else, shoot into your arms?"     No 6. CORD SYMPTOMS: "Any weakness or numbness of the arms or legs?"     No 7. CAUSE: "What do you think is causing the neck pain?"     Unsure 8. NECK OVERUSE: "Any recent activities that involved turning or twisting the neck?"     No 9. OTHER SYMPTOMS: "Do you have any other symptoms?" (e.g., headache, fever, chest pain, difficulty breathing, neck swelling)     No 10. PREGNANCY: "Is there any chance you are pregnant?" "When was your last menstrual period?"       n/a  Protocols used: NECK PAIN OR STIFFNESS-A-AH

## 2020-09-14 NOTE — Progress Notes (Signed)
Pt completed linezolid. Had skin graft Need to check cbc  While on linezolid ( which he has completed now He said he will get it at his PCP.

## 2020-09-15 ENCOUNTER — Ambulatory Visit (INDEPENDENT_AMBULATORY_CARE_PROVIDER_SITE_OTHER): Payer: Medicare Other | Admitting: Family Medicine

## 2020-09-15 ENCOUNTER — Other Ambulatory Visit: Payer: Self-pay

## 2020-09-15 ENCOUNTER — Encounter: Payer: Self-pay | Admitting: Family Medicine

## 2020-09-15 VITALS — Ht 72.0 in | Wt 194.0 lb

## 2020-09-15 DIAGNOSIS — M542 Cervicalgia: Secondary | ICD-10-CM | POA: Diagnosis not present

## 2020-09-15 DIAGNOSIS — G8929 Other chronic pain: Secondary | ICD-10-CM

## 2020-09-15 DIAGNOSIS — M25511 Pain in right shoulder: Secondary | ICD-10-CM

## 2020-09-15 DIAGNOSIS — M62838 Other muscle spasm: Secondary | ICD-10-CM | POA: Diagnosis not present

## 2020-09-15 NOTE — Progress Notes (Signed)
Virtual Visit via Telephone The purpose of this virtual visit is to provide medical care while limiting exposure to the novel coronavirus (COVID19) for both patient and office staff.  Consent was obtained for phone visit:  Yes.   Answered questions that patient had about telehealth interaction:  Yes.   I discussed the limitations, risks, security and privacy concerns of performing an evaluation and management service by telephone. I also discussed with the patient that there may be a patient responsible charge related to this service. The patient expressed understanding and agreed to proceed.  Patient Location: Home Provider Location: [facility] (Office)  Participants in virtual visit: - Patient: [name] - CMA: Orinda Kenner, CMA - Provider: Dr Parks Ranger  ---------------------------------------------------------------------- Chief Complaint  Patient presents with  . Neck Pain  . Shoulder Pain    S: Reviewed CMA documentation. I have called patient and gathered additional HPI as follows:  Neck Spasm / Strain Right Shoulder Pain Reports that symptoms started within past 2 nights with some muscle spasm and pain in neck, difficulty with rotating head / shoulder impacting him as well. Tried some heating pad with only mild relief. Still having pain with movements of head/neck. Recent history of foot procedure, has limited his mobility and causing him to rest in a particular fixed position.  Followed by Dr Delaine Lame ID - history of MRSA Bacteremia   Followed by Podiatry Dr Vickki Muff [facility] - had recent procedure.  Denies any known or suspected exposure to person with or possibly with COVID19.  Denies any fevers, chills, sweats, body ache, cough, shortness of breath, sinus pain or pressure, headache, abdominal pain, diarrhea  Past Medical History:  Diagnosis Date  . Arthritis   . Benign essential tremor   . Fracture, femur ([place]) 2012  . GERD  (gastroesophageal reflux disease)   . H/O echocardiogram    05/2013  . Hx of colonoscopy    04/22/2013  . Hypercholesterolemia   . Hypertension   . Myocardial infarction ([place])    1987  . Polyneuropathy   . Prostate cancer ([place]) 2004  . Superficial hematoma    Social History   Tobacco Use  . Smoking status: Never Smoker  . Smokeless tobacco: Never Used  Vaping Use  . Vaping Use: Never used  Substance Use Topics  . Alcohol use: Yes    Alcohol/week: 7.0 standard drinks    Types: 7 Glasses of wine per week    Comment: wine in the evening with dinner  . Drug use: No    Current Outpatient Medications:  .  Alpha Lipoic Acid 200 MG CAPS, Take 2 capsules by mouth daily., Disp: , Rfl:  .  amLODipine (NORVASC) 2.5 MG tablet, TAKE 1 TABLET BY MOUTH  DAILY (Patient taking differently: Take 2.5 mg by mouth daily.), Disp: 90 tablet, Rfl: 3 .  Apoaequorin 10 MG CAPS, Take 1 capsule by mouth as directed., Disp: , Rfl:  .  aspirin 81 MG tablet, Take 162 mg by mouth at bedtime., Disp: , Rfl:  .  azelastine (ASTELIN) 0.1 % nasal spray, Place 1 spray into both nostrils 2 (two) times daily. Use in each nostril as directed, Disp: 30 mL, Rfl: 5 .  B Complex Vitamins (VITAMIN-B COMPLEX PO), Take 1 tablet by mouth daily., Disp: , Rfl:  .  Biotin 1000 MCG tablet, Take 1,000 mcg by mouth daily., Disp: , Rfl:  .  Calcium Carb-Cholecalciferol 500-100 MG-UNIT CHEW, Chew 500 mg by mouth daily., Disp: ,  Rfl:  .  cyanocobalamin 1000 MCG tablet, Take 1,000 mcg by mouth daily., Disp: , Rfl:  .  diphenhydrAMINE (BENADRYL) 25 MG tablet, Take 25 mg by mouth at bedtime as needed for allergies., Disp: , Rfl:  .  folic acid (FOLVITE) 1 MG tablet, Take 1 mg by mouth daily., Disp: , Rfl:  .  furosemide (LASIX) 40 MG tablet, Take 1 tablet (40 mg total) by mouth daily., Disp: 30 tablet, Rfl: 2 .  gabapentin (NEURONTIN) 600 MG tablet, TAKE 2 TABLETS BY MOUTH  TWICE DAILY (Patient taking differently: Take 1,200 mg by mouth  2 (two) times daily.), Disp: 360 tablet, Rfl: 3 .  Glucosamine Sulfate 1000 MG CAPS, Take 1 capsule by mouth daily., Disp: , Rfl:  .  Lecithin 400 MG CAPS, Take 400 mg by mouth daily., Disp: , Rfl:  .  linezolid (ZYVOX) 600 MG tablet, Take 1 tablet (600 mg total) by mouth 2 (two) times daily., Disp: 28 tablet, Rfl: 0 .  melatonin 5 MG TABS, Take 5 mg by mouth at bedtime., Disp: , Rfl:  .  Omega 3 1000 MG CAPS, Take 1,000 mg by mouth daily., Disp: , Rfl:  .  omeprazole (PRILOSEC) 20 MG capsule, TAKE 1 CAPSULE BY MOUTH  TWICE DAILY BEFORE MEALS (Patient taking differently: Take 20 mg by mouth 2 (two) times daily before a meal.), Disp: 180 capsule, Rfl: 3 .  potassium chloride SA (KLOR-CON) 20 MEQ tablet, Take 1 tablet (20 mEq total) by mouth 2 (two) times daily., Disp: 180 tablet, Rfl: 3 .  pravastatin (PRAVACHOL) 20 MG tablet, Take 4 tablets (80 mg total) by mouth daily., Disp: , Rfl:  .  primidone (MYSOLINE) 50 MG tablet, TAKE 1 TABLET BY MOUTH 3  TIMES DAILY (Patient taking differently: Take 50-100 mg by mouth See admin instructions. Take 2 tablets (100mg ) by mouth every morning and take 1 tablet (50mg ) by mouth every night), Disp: 270 tablet, Rfl: 3 .  Selenium 200 MCG CAPS, Take 200 mcg by mouth daily., Disp: , Rfl:  .  tretinoin (RETIN-A) 0.05 % cream, Apply 1 application topically at bedtime., Disp: , Rfl:  .  triamcinolone (KENALOG) 0.1 %, Apply 1 application topically 2 (two) times daily. (apply to hands, legs and feet), Disp: , Rfl:   Depression screen [facility] 2/9 12/19/2019 03/20/2019 02/05/2019  Decreased Interest 0 0 0  Down, Depressed, Hopeless 0 0 0  PHQ - 2 Score 0 0 0    No flowsheet data found.  -------------------------------------------------------------------------- O: No physical exam performed due to remote telephone encounter.  Lab results reviewed.  Recent Results (from the past 2160 hour(s))  Lactic acid, plasma     Status: None   Collection Time: 08/09/20  3:49 PM   Result Value Ref Range   Lactic Acid, Venous 1.1 0.5 - 1.9 mmol/L    Comment: Performed at [facility], [address] [place], [place] [address]  Urinalysis, Complete w Microscopic     Status: Abnormal   Collection Time: 08/09/20  3:49 PM  Result Value Ref Range   Color, Urine STRAW (A) YELLOW   APPearance CLEAR (A) CLEAR   Specific Gravity, Urine 1.005 1.005 - 1.030   pH 6.0 5.0 - 8.0   Glucose, UA NEGATIVE NEGATIVE mg/dL   Hgb urine dipstick MODERATE (A) NEGATIVE   Bilirubin Urine NEGATIVE NEGATIVE   Ketones, ur NEGATIVE NEGATIVE mg/dL   Protein, ur NEGATIVE NEGATIVE mg/dL   Nitrite NEGATIVE NEGATIVE   Leukocytes,Ua NEGATIVE NEGATIVE  RBC / HPF 0-5 0 - 5 RBC/hpf   WBC, UA 0-5 0 - 5 WBC/hpf   Bacteria, UA NONE SEEN NONE SEEN   Squamous Epithelial / LPF NONE SEEN 0 - 5   Mucus PRESENT     Comment: Performed at [facility], [address] [place], [place] [address]  Blood culture (routine x 2)     Status: Abnormal   Collection Time: 08/09/20  3:49 PM   Specimen: BLOOD  Result Value Ref Range   Specimen Description      BLOOD BLOOD LEFT FOREARM Performed at [facility], [address] [place], [place] [address]    Special Requests      BOTTLES DRAWN AEROBIC AND ANAEROBIC Blood Culture adequate volume Performed at [facility], [address] [place], [place] [address]    Culture  Setup Time      GRAM POSITIVE COCCI AEROBIC BOTTLE ONLY CRITICAL RESULT CALLED TO, READ BACK BY AND VERIFIED WITH: NATHAN BELUE ON 08/10/20 AT 3903 QSD Performed at [facility], [place] [address] [place], [place] [address]    Culture METHICILLIN RESISTANT STAPHYLOCOCCUS AUREUS (A)    Report Status 08/12/2020 FINAL    Organism ID, Bacteria METHICILLIN RESISTANT STAPHYLOCOCCUS AUREUS       Susceptibility   Methicillin resistant staphylococcus aureus - MIC*    CIPROFLOXACIN >=8 RESISTANT Resistant     ERYTHROMYCIN <=0.25 SENSITIVE Sensitive      GENTAMICIN <=0.5 SENSITIVE Sensitive     OXACILLIN >=4 RESISTANT Resistant     TETRACYCLINE >=16 RESISTANT Resistant     VANCOMYCIN 1 SENSITIVE Sensitive     TRIMETH/SULFA <=10 SENSITIVE Sensitive     CLINDAMYCIN <=0.25 SENSITIVE Sensitive     RIFAMPIN <=0.5 SENSITIVE Sensitive     Inducible Clindamycin NEGATIVE Sensitive     * METHICILLIN RESISTANT STAPHYLOCOCCUS AUREUS  Blood Culture ID Panel (Reflexed)     Status: Abnormal   Collection Time: 08/09/20  3:49 PM  Result Value Ref Range   Enterococcus faecalis NOT DETECTED NOT DETECTED   Enterococcus Faecium NOT DETECTED NOT DETECTED   Listeria monocytogenes NOT DETECTED NOT DETECTED   Staphylococcus species DETECTED (A) NOT DETECTED    Comment: CRITICAL RESULT CALLED TO, READ BACK BY AND VERIFIED WITH: NATHAN BELUE ON 08/10/20 AT 3007 QSD    Staphylococcus aureus (BCID) DETECTED (A) NOT DETECTED    Comment: Methicillin (oxacillin)-resistant Staphylococcus aureus (MRSA). MRSA is predictably resistant to beta-lactam antibiotics (except ceftaroline). Preferred therapy is vancomycin unless clinically contraindicated. Patient requires contact precautions if  hospitalized. CRITICAL RESULT CALLED TO, READ BACK BY AND VERIFIED WITH: NATHAN BELUE ON 08/10/20 AT 0616 QSD    Staphylococcus epidermidis NOT DETECTED NOT DETECTED   Staphylococcus lugdunensis NOT DETECTED NOT DETECTED   Streptococcus species NOT DETECTED NOT DETECTED   Streptococcus agalactiae NOT DETECTED NOT DETECTED   Streptococcus pneumoniae NOT DETECTED NOT DETECTED   Streptococcus pyogenes NOT DETECTED NOT DETECTED   A.calcoaceticus-baumannii NOT DETECTED NOT DETECTED   Bacteroides fragilis NOT DETECTED NOT DETECTED   Enterobacterales NOT DETECTED NOT DETECTED   Enterobacter cloacae complex NOT DETECTED NOT DETECTED   Escherichia coli NOT DETECTED NOT DETECTED   Klebsiella aerogenes NOT DETECTED NOT DETECTED   Klebsiella oxytoca NOT DETECTED NOT DETECTED   Klebsiella  pneumoniae NOT DETECTED NOT DETECTED   Proteus species NOT DETECTED NOT DETECTED   Salmonella species NOT DETECTED NOT DETECTED   Serratia marcescens NOT DETECTED NOT DETECTED   Haemophilus influenzae NOT DETECTED  NOT DETECTED   Neisseria meningitidis NOT DETECTED NOT DETECTED   Pseudomonas aeruginosa NOT DETECTED NOT DETECTED   Stenotrophomonas maltophilia NOT DETECTED NOT DETECTED   Candida albicans NOT DETECTED NOT DETECTED   Candida auris NOT DETECTED NOT DETECTED   Candida glabrata NOT DETECTED NOT DETECTED   Candida krusei NOT DETECTED NOT DETECTED   Candida parapsilosis NOT DETECTED NOT DETECTED   Candida tropicalis NOT DETECTED NOT DETECTED   Cryptococcus neoformans/gattii NOT DETECTED NOT DETECTED   Meth resistant mecA/C and MREJ DETECTED (A) NOT DETECTED    Comment: CRITICAL RESULT CALLED TO, READ BACK BY AND VERIFIED WITH: NATHAN BELUE ON 08/10/20 AT 2202 QSD Performed at [facility], [address] [place], [place] [address]   Blood culture (routine x 2)     Status: Abnormal   Collection Time: 08/09/20  3:50 PM   Specimen: BLOOD  Result Value Ref Range   Specimen Description      BLOOD RIGHT ANTECUBITAL Performed at [facility], [address], [place], [place] [address]    Special Requests      BOTTLES DRAWN AEROBIC AND ANAEROBIC Blood Culture adequate volume Performed at [facility], [address] [place], [place] [address]    Culture  Setup Time      GRAM POSITIVE COCCI IN BOTH AEROBIC AND ANAEROBIC BOTTLES CRITICAL VALUE NOTED.  VALUE IS CONSISTENT WITH PREVIOUSLY REPORTED AND CALLED VALUE. Performed at [facility], [place], [place] [address]    Culture (A)     STAPHYLOCOCCUS AUREUS SUSCEPTIBILITIES PERFORMED ON PREVIOUS CULTURE WITHIN THE LAST 5 DAYS. Performed at [facility], [place] [address] [place], [place] [address]    Report Status 08/12/2020 FINAL   Comprehensive metabolic panel      Status: Abnormal   Collection Time: 08/09/20  3:51 PM  Result Value Ref Range   Sodium 125 (L) 135 - 145 mmol/L   Potassium 2.7 (LL) 3.5 - 5.1 mmol/L    Comment: CRITICAL RESULT CALLED TO, READ BACK BY AND VERIFIED WITH ROBIN REGISTER 08/09/20 AT 1701 BY ACR   Chloride 86 (L) 98 - 111 mmol/L   CO2 26 22 - 32 mmol/L   Glucose, Bld 122 (H) 70 - 99 mg/dL    Comment: Glucose reference range applies only to samples taken after fasting for at least 8 hours.   BUN 18 8 - 23 mg/dL   Creatinine, Ser 0.83 0.61 - 1.24 mg/dL   Calcium 9.7 8.9 - 10.3 mg/dL   Total Protein 8.4 (H) 6.5 - 8.1 g/dL   Albumin 4.2 3.5 - 5.0 g/dL   AST 17 15 - 41 U/L   ALT 13 0 - 44 U/L   Alkaline Phosphatase 61 38 - 126 U/L   Total Bilirubin 1.1 0.3 - 1.2 mg/dL   GFR, Estimated >60 >60 mL/min    Comment: (NOTE) Calculated using the CKD-EPI Creatinine Equation (2021)    Anion gap 13 5 - 15    Comment: Performed at [facility], [address] [place], [place] [address]  CBC with Differential     Status: Abnormal   Collection Time: 08/09/20  3:51 PM  Result Value Ref Range   WBC 14.6 (H) 4.0 - 10.5 K/uL   RBC 3.46 (L) 4.22 - 5.81 MIL/uL   Hemoglobin 12.7 (L) 13.0 - 17.0 g/dL   HCT 35.2 (L) 39.0 - 52.0 %   MCV 101.7 (H) 80.0 - 100.0 fL   MCH  36.7 (H) 26.0 - 34.0 pg   MCHC 36.1 (H) 30.0 - 36.0 g/dL   RDW 12.2 11.5 - 15.5 %   Platelets 263 150 - 400 K/uL   nRBC 0.0 0.0 - 0.2 %   Neutrophils Relative % 91 %   Neutro Abs 13.3 (H) 1.7 - 7.7 K/uL   Lymphocytes Relative 3 %   Lymphs Abs 0.4 (L) 0.7 - 4.0 K/uL   Monocytes Relative 5 %   Monocytes Absolute 0.7 0.1 - 1.0 K/uL   Eosinophils Relative 0 %   Eosinophils Absolute 0.0 0.0 - 0.5 K/uL   Basophils Relative 0 %   Basophils Absolute 0.0 0.0 - 0.1 K/uL   Immature Granulocytes 1 %   Abs Immature Granulocytes 0.10 (H) 0.00 - 0.07 K/uL    Comment: Performed at [facility], [address] [place], [place] [address]  Magnesium     Status:  Abnormal   Collection Time: 08/09/20  3:51 PM  Result Value Ref Range   Magnesium 1.5 (L) 1.7 - 2.4 mg/dL    Comment: Performed at [facility], [address] [place], [place] [address]  Resp Panel by RT-PCR (Flu A&B, Covid) Nasopharyngeal Swab     Status: None   Collection Time: 08/09/20  4:10 PM   Specimen: Nasopharyngeal Swab; Nasopharyngeal(NP) swabs in vial transport medium  Result Value Ref Range   SARS Coronavirus 2 by RT PCR NEGATIVE NEGATIVE    Comment: (NOTE) SARS-CoV-2 target nucleic acids are NOT DETECTED.  The SARS-CoV-2 RNA is generally detectable in upper respiratory specimens during the acute phase of infection. The lowest concentration of SARS-CoV-2 viral copies this assay can detect is 138 copies/mL. A negative result does not preclude SARS-Cov-2 infection and should not be used as the sole basis for treatment or other patient management decisions. A negative result may occur with  improper specimen collection/handling, submission of specimen other than nasopharyngeal swab, presence of viral mutation(s) within the areas targeted by this assay, and inadequate number of viral copies(<138 copies/mL). A negative result must be combined with clinical observations, patient history, and epidemiological information. The expected result is Negative.  Fact Sheet for Patients:  EntrepreneurPulse.com.au  Fact Sheet for Healthcare Providers:  IncredibleEmployment.be  This test is no t yet approved or cleared by the [place] FDA and  has been authorized for detection and/or diagnosis of SARS-CoV-2 by FDA under an Emergency Use Authorization (EUA). This EUA will remain  in effect (meaning this test can be used) for the duration of the COVID-19 declaration under Section 564(b)(1) of the Act, 21 U.S.C.section 360bbb-3(b)(1), unless the authorization is terminated  or revoked sooner.       Influenza A by PCR NEGATIVE  NEGATIVE   Influenza B by PCR NEGATIVE NEGATIVE    Comment: (NOTE) The Xpert Xpress SARS-CoV-2/FLU/RSV plus assay is intended as an aid in the diagnosis of influenza from Nasopharyngeal swab specimens and should not be used as a sole basis for treatment. Nasal washings and aspirates are unacceptable for Xpert Xpress SARS-CoV-2/FLU/RSV testing.  Fact Sheet for Patients: EntrepreneurPulse.com.au  Fact Sheet for Healthcare Providers: IncredibleEmployment.be  This test is not yet approved or cleared by the [place] FDA and has been authorized for detection and/or diagnosis of SARS-CoV-2 by FDA under an Emergency Use Authorization (EUA). This EUA will remain in effect (meaning this test can be used) for the duration of the COVID-19 declaration under Section 564(b)(1) of the Act, 21 U.S.C. section 360bbb-3(b)(1), unless the authorization  is terminated or revoked.  Performed at [facility], [address] [place], [place] [address]   Lactic acid, plasma     Status: None   Collection Time: 08/09/20  6:04 PM  Result Value Ref Range   Lactic Acid, Venous 1.0 0.5 - 1.9 mmol/L    Comment: Performed at [facility], [address] [place], [place] [address]  Sedimentation rate     Status: Abnormal   Collection Time: 08/09/20  6:04 PM  Result Value Ref Range   Sed Rate 71 (H) 0 - 20 mm/hr    Comment: Performed at [facility], [address] [place], [place] [address]  C-reactive protein     Status: Abnormal   Collection Time: 08/09/20  6:04 PM  Result Value Ref Range   CRP 20.6 (H) <1.0 mg/dL    Comment: Performed at [facility], [place] [address] [place], [place] [address]  TSH     Status: Abnormal   Collection Time: 08/09/20  6:04 PM  Result Value Ref Range   TSH 0.270 (L) 0.350 - 4.500 uIU/mL    Comment: Performed by a 3rd Generation assay with a functional sensitivity of <=0.01 uIU/mL. Performed at  [facility], [address] [place], [place] [address]   Basic metabolic panel     Status: Abnormal   Collection Time: 08/09/20 10:24 PM  Result Value Ref Range   Sodium 126 (L) 135 - 145 mmol/L   Potassium 3.3 (L) 3.5 - 5.1 mmol/L   Chloride 91 (L) 98 - 111 mmol/L   CO2 25 22 - 32 mmol/L   Glucose, Bld 135 (H) 70 - 99 mg/dL    Comment: Glucose reference range applies only to samples taken after fasting for at least 8 hours.   BUN 18 8 - 23 mg/dL   Creatinine, Ser 1.02 0.61 - 1.24 mg/dL   Calcium 9.2 8.9 - 10.3 mg/dL   GFR, Estimated >60 >60 mL/min    Comment: (NOTE) Calculated using the CKD-EPI Creatinine Equation (2021)    Anion gap 10 5 - 15    Comment: Performed at [facility], [address] [place], [place] [address]  Basic metabolic panel     Status: Abnormal   Collection Time: 08/10/20  5:03 AM  Result Value Ref Range   Sodium 127 (L) 135 - 145 mmol/L   Potassium 2.9 (L) 3.5 - 5.1 mmol/L   Chloride 92 (L) 98 - 111 mmol/L   CO2 24 22 - 32 mmol/L   Glucose, Bld 107 (H) 70 - 99 mg/dL    Comment: Glucose reference range applies only to samples taken after fasting for at least 8 hours.   BUN 19 8 - 23 mg/dL   Creatinine, Ser 0.88 0.61 - 1.24 mg/dL   Calcium 8.8 (L) 8.9 - 10.3 mg/dL   GFR, Estimated >60 >60 mL/min    Comment: (NOTE) Calculated using the CKD-EPI Creatinine Equation (2021)    Anion gap 11 5 - 15    Comment: Performed at [facility], [address] [place],  [address]  CBC     Status: Abnormal   Collection Time: 08/10/20  5:03 AM  Result Value Ref Range   WBC 11.3 (H) 4.0 - 10.5 K/uL   RBC 2.68 (L) 4.22 - 5.81 MIL/uL   Hemoglobin 9.7 (L) 13.0 - 17.0 g/dL   HCT 27.4 (L) 39.0 - 52.0 %   MCV 102.2 (H) 80.0 - 100.0 fL  MCH 36.2 (H) 26.0 - 34.0 pg   MCHC 35.4 30.0 - 36.0 g/dL   RDW 12.4 11.5 - 15.5 %   Platelets 207 150 - 400 K/uL   nRBC 0.0 0.0 - 0.2 %    Comment: Performed at [facility], [address] [place], [place] [address]  MRSA PCR Screening     Status: Abnormal   Collection Time: 08/10/20 10:50 AM   Specimen: Nasal Mucosa; Nasopharyngeal  Result Value Ref Range   MRSA by PCR POSITIVE (A) NEGATIVE    Comment:        The GeneXpert MRSA Assay (FDA approved for NASAL specimens only), is one component of a comprehensive MRSA colonization surveillance program. It is not intended to diagnose MRSA infection nor to guide or monitor treatment for MRSA infections. RESULT CALLED TO, READ BACK BY AND VERIFIED WITH: Jonnie Kind 08/10/20 AT 1257 BY ACR Performed at [facility], [address] [place], [place] [address]   ECHOCARDIOGRAM COMPLETE     Status: None   Collection Time: 08/10/20  2:36 PM  Result Value Ref Range   Weight 3,372.8 oz   Height 72 in   BP 114/66 mmHg   Ao pk vel 1.41 m/s   AV Area VTI 3.53 cm2   AR max vel 3.59 cm2   AV Mean grad 4.0 mmHg   AV Peak grad 8.0 mmHg   Single Plane A4C EF 59.2 %   S' Lateral 3.80 cm   AV Area mean vel 3.64 cm2   Area-P 1/2 3.87 cm2   MV VTI 3.56 cm2  CULTURE, BLOOD (ROUTINE X 2) w Reflex to ID Panel     Status: None   Collection Time: 08/10/20 11:55 PM   Specimen: BLOOD  Result Value Ref Range   Specimen Description BLOOD BLOOD LEFT HAND    Special Requests      BOTTLES DRAWN AEROBIC AND ANAEROBIC Blood Culture adequate volume   Culture      NO GROWTH 5 DAYS Performed at [facility], [address] [place], [place] [address]    Report Status 08/16/2020 FINAL   CULTURE, BLOOD (ROUTINE X 2) w Reflex to ID Panel     Status: None   Collection Time: 08/10/20 11:55 PM   Specimen: BLOOD  Result Value Ref Range   Specimen Description BLOOD BLOOD RIGHT HAND    Special Requests      BOTTLES DRAWN AEROBIC AND ANAEROBIC Blood Culture adequate volume   Culture      NO GROWTH 5 DAYS Performed at [facility], [address], [place], [place] [address]    Report Status 08/16/2020  FINAL   Magnesium     Status: None   Collection Time: 08/11/20  7:09 AM  Result Value Ref Range   Magnesium 1.8 1.7 - 2.4 mg/dL    Comment: Performed at [facility], [address] [place], [place] [address]  Sedimentation rate     Status: Abnormal   Collection Time: 08/11/20  7:09 AM  Result Value Ref Range   Sed Rate 106 (H) 0 - 20 mm/hr    Comment: Performed at [facility], [address] [place], [place] [address]  Basic metabolic panel     Status: Abnormal   Collection Time: 08/11/20  7:09 AM  Result Value Ref Range   Sodium 130 (L) 135 - 145 mmol/L   Potassium 3.6 3.5 - 5.1 mmol/L   Chloride 98 98 - 111  mmol/L   CO2 22 22 - 32 mmol/L   Glucose, Bld 87 70 - 99 mg/dL    Comment: Glucose reference range applies only to samples taken after fasting for at least 8 hours.   BUN 23 8 - 23 mg/dL   Creatinine, Ser 1.03 0.61 - 1.24 mg/dL   Calcium 9.1 8.9 - 10.3 mg/dL   GFR, Estimated >60 >60 mL/min    Comment: (NOTE) Calculated using the CKD-EPI Creatinine Equation (2021)    Anion gap 10 5 - 15    Comment: Performed at [facility], [address] [place], [place] [address]  CBC     Status: Abnormal   Collection Time: 08/11/20  7:09 AM  Result Value Ref Range   WBC 8.9 4.0 - 10.5 K/uL   RBC 2.77 (L) 4.22 - 5.81 MIL/uL   Hemoglobin 10.4 (L) 13.0 - 17.0 g/dL   HCT 29.0 (L) 39.0 - 52.0 %   MCV 104.7 (H) 80.0 - 100.0 fL   MCH 37.5 (H) 26.0 - 34.0 pg   MCHC 35.9 30.0 - 36.0 g/dL   RDW 12.4 11.5 - 15.5 %   Platelets 204 150 - 400 K/uL   nRBC 0.0 0.0 - 0.2 %    Comment: Performed at [facility], [address], [place], [place] [address]  Aerobic/Anaerobic Culture w Gram Stain (surgical/deep wound)     Status: None   Collection Time: 08/11/20  4:40 PM   Specimen: Wound; Tissue  Result Value Ref Range   Specimen Description      WOUND Performed at [facility], [address] [place], [place] [address]    Special Requests       LEFT FOOT Performed at [facility], [place], [place] [address]    Gram Stain      FEW WBC PRESENT,BOTH PMN AND MONONUCLEAR GRAM POSITIVE COCCI    Culture      ABUNDANT METHICILLIN RESISTANT STAPHYLOCOCCUS AUREUS NO ANAEROBES ISOLATED Performed at [facility], [place] [address], [place], [place] [address]    Report Status 08/16/2020 FINAL    Organism ID, Bacteria METHICILLIN RESISTANT STAPHYLOCOCCUS AUREUS       Susceptibility   Methicillin resistant staphylococcus aureus - MIC*    CIPROFLOXACIN >=8 RESISTANT Resistant     ERYTHROMYCIN <=0.25 SENSITIVE Sensitive     GENTAMICIN <=0.5 SENSITIVE Sensitive     OXACILLIN >=4 RESISTANT Resistant     TETRACYCLINE >=16 RESISTANT Resistant     VANCOMYCIN <=0.5 SENSITIVE Sensitive     TRIMETH/SULFA <=10 SENSITIVE Sensitive     CLINDAMYCIN <=0.25 SENSITIVE Sensitive     RIFAMPIN <=0.5 SENSITIVE Sensitive     Inducible Clindamycin NEGATIVE Sensitive     * ABUNDANT METHICILLIN RESISTANT STAPHYLOCOCCUS AUREUS  Vancomycin, peak     Status: Abnormal   Collection Time: 08/12/20  5:25 AM  Result Value Ref Range   Vancomycin Pk 26 (L) 30 - 40 ug/mL    Comment: Performed at [facility], [address], [place], [place] [address]  Basic metabolic panel     Status: Abnormal   Collection Time: 08/12/20  5:25 AM  Result Value Ref Range   Sodium 129 (L) 135 - 145 mmol/L   Potassium 3.5 3.5 - 5.1 mmol/L   Chloride 97 (L) 98 - 111 mmol/L   CO2 24 22 - 32 mmol/L   Glucose, Bld 85 70 - 99 mg/dL    Comment: Glucose reference range applies only  to samples taken after fasting for at least 8 hours.   BUN 18 8 - 23 mg/dL   Creatinine, Ser 0.89 0.61 - 1.24 mg/dL   Calcium 9.2 8.9 - 10.3 mg/dL   GFR, Estimated >60 >60 mL/min    Comment: (NOTE) Calculated using the CKD-EPI Creatinine Equation (2021)    Anion gap 8 5 - 15    Comment: Performed at [facility], [address] [place], [place]  [address]  CBC     Status: Abnormal   Collection Time: 08/12/20  5:25 AM  Result Value Ref Range   WBC 8.6 4.0 - 10.5 K/uL   RBC 2.74 (L) 4.22 - 5.81 MIL/uL   Hemoglobin 10.0 (L) 13.0 - 17.0 g/dL   HCT 28.5 (L) 39.0 - 52.0 %   MCV 104.0 (H) 80.0 - 100.0 fL   MCH 36.5 (H) 26.0 - 34.0 pg   MCHC 35.1 30.0 - 36.0 g/dL   RDW 12.1 11.5 - 15.5 %   Platelets 226 150 - 400 K/uL   nRBC 0.0 0.0 - 0.2 %    Comment: Performed at [facility], [address] [place], [place] [address]  Glucose, capillary     Status: Abnormal   Collection Time: 08/12/20  9:28 AM  Result Value Ref Range   Glucose-Capillary 121 (H) 70 - 99 mg/dL    Comment: Glucose reference range applies only to samples taken after fasting for at least 8 hours.  Vancomycin, trough     Status: Abnormal   Collection Time: 08/12/20 12:05 PM  Result Value Ref Range   Vancomycin Tr 23 (HH) 15 - 20 ug/mL    Comment: CRITICAL RESULT CALLED TO, READ BACK BY AND VERIFIED WITH SUSAN WATSON AT 1245 ON 08/12/2020 [place]. Performed at [facility], [address] [place], [place] [address]   Aerobic/Anaerobic Culture w Gram Stain (surgical/deep wound)     Status: None   Collection Time: 08/12/20  1:38 PM   Specimen: Wound  Result Value Ref Range   Specimen Description      WOUND Performed at [facility], [address] [place], [place] [address]    Special Requests      NONE Performed at [facility], [place], [place] [address]    Gram Stain      NO WBC SEEN RARE GRAM POSITIVE COCCI IN CLUSTERS    Culture      FEW METHICILLIN RESISTANT STAPHYLOCOCCUS AUREUS NO ANAEROBES ISOLATED Performed at [facility], [place] [address], [place], [place] [address]    Report Status 08/17/2020 FINAL    Organism ID, Bacteria METHICILLIN RESISTANT STAPHYLOCOCCUS AUREUS       Susceptibility   Methicillin resistant staphylococcus aureus - MIC*    CIPROFLOXACIN 4 RESISTANT Resistant      ERYTHROMYCIN <=0.25 SENSITIVE Sensitive     GENTAMICIN <=0.5 SENSITIVE Sensitive     OXACILLIN >=4 RESISTANT Resistant     TETRACYCLINE >=16 RESISTANT Resistant     VANCOMYCIN 1 SENSITIVE Sensitive     TRIMETH/SULFA <=10 SENSITIVE Sensitive     CLINDAMYCIN <=0.25 SENSITIVE Sensitive     RIFAMPIN <=0.5 SENSITIVE Sensitive     Inducible Clindamycin NEGATIVE Sensitive     * FEW METHICILLIN RESISTANT STAPHYLOCOCCUS AUREUS  Basic metabolic panel     Status: Abnormal   Collection Time: 08/13/20  7:15 AM  Result Value Ref Range   Sodium 132 (L) 135 - 145 mmol/L   Potassium 3.2 (  L) 3.5 - 5.1 mmol/L   Chloride 99 98 - 111 mmol/L   CO2 24 22 - 32 mmol/L   Glucose, Bld 95 70 - 99 mg/dL    Comment: Glucose reference range applies only to samples taken after fasting for at least 8 hours.   BUN 13 8 - 23 mg/dL   Creatinine, Ser 0.83 0.61 - 1.24 mg/dL   Calcium 9.2 8.9 - 10.3 mg/dL   GFR, Estimated >60 >60 mL/min    Comment: (NOTE) Calculated using the CKD-EPI Creatinine Equation (2021)    Anion gap 9 5 - 15    Comment: Performed at [facility], [address] [place], [place] [address]  CBC     Status: Abnormal   Collection Time: 08/13/20  7:15 AM  Result Value Ref Range   WBC 5.6 4.0 - 10.5 K/uL   RBC 2.76 (L) 4.22 - 5.81 MIL/uL   Hemoglobin 10.0 (L) 13.0 - 17.0 g/dL   HCT 28.6 (L) 39.0 - 52.0 %   MCV 103.6 (H) 80.0 - 100.0 fL   MCH 36.2 (H) 26.0 - 34.0 pg   MCHC 35.0 30.0 - 36.0 g/dL   RDW 12.1 11.5 - 15.5 %   Platelets 247 150 - 400 K/uL   nRBC 0.0 0.0 - 0.2 %    Comment: Performed at [facility], [address], [place], [place] [address]  Magnesium     Status: Abnormal   Collection Time: 08/13/20  7:15 AM  Result Value Ref Range   Magnesium 1.4 (L) 1.7 - 2.4 mg/dL    Comment: Performed at [facility], [address], [place], [place] [address]  Basic metabolic panel     Status: Abnormal   Collection Time: 08/14/20  4:26 AM  Result Value Ref  Range   Sodium 133 (L) 135 - 145 mmol/L   Potassium 3.7 3.5 - 5.1 mmol/L   Chloride 100 98 - 111 mmol/L   CO2 25 22 - 32 mmol/L   Glucose, Bld 96 70 - 99 mg/dL    Comment: Glucose reference range applies only to samples taken after fasting for at least 8 hours.   BUN 15 8 - 23 mg/dL   Creatinine, Ser 0.81 0.61 - 1.24 mg/dL   Calcium 9.0 8.9 - 10.3 mg/dL   GFR, Estimated >60 >60 mL/min    Comment: (NOTE) Calculated using the CKD-EPI Creatinine Equation (2021)    Anion gap 8 5 - 15    Comment: Performed at [facility], [address] [place], [place] [address]  CBC     Status: Abnormal   Collection Time: 08/14/20  4:26 AM  Result Value Ref Range   WBC 5.4 4.0 - 10.5 K/uL   RBC 2.71 (L) 4.22 - 5.81 MIL/uL   Hemoglobin 9.9 (L) 13.0 - 17.0 g/dL   HCT 28.1 (L) 39.0 - 52.0 %   MCV 103.7 (H) 80.0 - 100.0 fL   MCH 36.5 (H) 26.0 - 34.0 pg   MCHC 35.2 30.0 - 36.0 g/dL   RDW 12.2 11.5 - 15.5 %   Platelets 270 150 - 400 K/uL   nRBC 0.0 0.0 - 0.2 %    Comment: Performed at [facility], [address] [place], [place] [address]  Magnesium     Status: None   Collection Time: 08/14/20  4:26 AM  Result Value Ref Range   Magnesium 1.7 1.7 - 2.4 mg/dL    Comment: Performed at [facility],  [place], [place] [address]  CK     Status: Abnormal   Collection Time: 08/14/20  4:26 AM  Result Value Ref Range   Total CK 23 (L) 49 - 397 U/L    Comment: Performed at [facility], [address] [place], [place] [address]  Basic metabolic panel     Status: Abnormal   Collection Time: 08/15/20  5:41 AM  Result Value Ref Range   Sodium 134 (L) 135 - 145 mmol/L   Potassium 3.5 3.5 - 5.1 mmol/L   Chloride 99 98 - 111 mmol/L   CO2 26 22 - 32 mmol/L   Glucose, Bld 92 70 - 99 mg/dL    Comment: Glucose reference range applies only to samples taken after fasting for at least 8 hours.   BUN 12 8 - 23 mg/dL   Creatinine, Ser 0.77 0.61 - 1.24 mg/dL    Calcium 9.5 8.9 - 10.3 mg/dL   GFR, Estimated >60 >60 mL/min    Comment: (NOTE) Calculated using the CKD-EPI Creatinine Equation (2021)    Anion gap 9 5 - 15    Comment: Performed at [facility], [address] [place], [place] [address]  CBC     Status: Abnormal   Collection Time: 08/15/20  5:41 AM  Result Value Ref Range   WBC 5.4 4.0 - 10.5 K/uL   RBC 3.01 (L) 4.22 - 5.81 MIL/uL   Hemoglobin 10.9 (L) 13.0 - 17.0 g/dL   HCT 30.9 (L) 39.0 - 52.0 %   MCV 102.7 (H) 80.0 - 100.0 fL   MCH 36.2 (H) 26.0 - 34.0 pg   MCHC 35.3 30.0 - 36.0 g/dL   RDW 11.9 11.5 - 15.5 %   Platelets 293 150 - 400 K/uL   nRBC 0.0 0.0 - 0.2 %    Comment: Performed at [facility], [address] [place], [place] [address]  Magnesium     Status: Abnormal   Collection Time: 08/15/20  5:41 AM  Result Value Ref Range   Magnesium 1.6 (L) 1.7 - 2.4 mg/dL    Comment: Performed at [facility], [address], [place], [place] [address]  Basic metabolic panel     Status: Abnormal   Collection Time: 08/16/20  6:00 AM  Result Value Ref Range   Sodium 134 (L) 135 - 145 mmol/L   Potassium 3.9 3.5 - 5.1 mmol/L   Chloride 99 98 - 111 mmol/L   CO2 27 22 - 32 mmol/L   Glucose, Bld 104 (H) 70 - 99 mg/dL    Comment: Glucose reference range applies only to samples taken after fasting for at least 8 hours.   BUN 14 8 - 23 mg/dL   Creatinine, Ser 0.77 0.61 - 1.24 mg/dL   Calcium 9.4 8.9 - 10.3 mg/dL   GFR, Estimated >60 >60 mL/min    Comment: (NOTE) Calculated using the CKD-EPI Creatinine Equation (2021)    Anion gap 8 5 - 15    Comment: Performed at [facility], [address] [place], [place] [address]  CBC     Status: Abnormal   Collection Time: 08/16/20  6:00 AM  Result Value Ref Range   WBC 6.7 4.0 - 10.5 K/uL   RBC 2.99 (L) 4.22 - 5.81 MIL/uL   Hemoglobin 11.0 (L) 13.0 - 17.0 g/dL   HCT 30.8 (L) 39.0 - 52.0 %   MCV 103.0 (H) 80.0 - 100.0 fL   MCH 36.8 (H)  26.0  - 34.0 pg   MCHC 35.7 30.0 - 36.0 g/dL   RDW 12.2 11.5 - 15.5 %   Platelets 310 150 - 400 K/uL   nRBC 0.0 0.0 - 0.2 %    Comment: Performed at [facility], [address] [place], [place] [address]  Magnesium     Status: None   Collection Time: 08/16/20  6:00 AM  Result Value Ref Range   Magnesium 1.7 1.7 - 2.4 mg/dL    Comment: Performed at [facility], [address] [place], [place] [address]  Basic metabolic panel     Status: Abnormal   Collection Time: 08/17/20  5:00 AM  Result Value Ref Range   Sodium 134 (L) 135 - 145 mmol/L   Potassium 3.7 3.5 - 5.1 mmol/L   Chloride 98 98 - 111 mmol/L   CO2 27 22 - 32 mmol/L   Glucose, Bld 99 70 - 99 mg/dL    Comment: Glucose reference range applies only to samples taken after fasting for at least 8 hours.   BUN 15 8 - 23 mg/dL   Creatinine, Ser 0.73 0.61 - 1.24 mg/dL   Calcium 9.2 8.9 - 10.3 mg/dL   GFR, Estimated >60 >60 mL/min    Comment: (NOTE) Calculated using the CKD-EPI Creatinine Equation (2021)    Anion gap 9 5 - 15    Comment: Performed at [facility], [address] [place], [place] [address]  Magnesium     Status: None   Collection Time: 08/17/20  5:00 AM  Result Value Ref Range   Magnesium 1.7 1.7 - 2.4 mg/dL    Comment: Performed at [facility] [address] [place], [place] [address]  Glucose, capillary     Status: None   Collection Time: 08/17/20  8:07 AM  Result Value Ref Range   Glucose-Capillary 91 70 - 99 mg/dL    Comment: Glucose reference range applies only to samples taken after fasting for at least 8 hours.  SARS CORONAVIRUS 2 (TAT 6-24 HRS) Nasopharyngeal Nasopharyngeal Swab     Status: None   Collection Time: 08/17/20  4:22 PM   Specimen: Nasopharyngeal Swab  Result Value Ref Range   SARS Coronavirus 2 NEGATIVE NEGATIVE    Comment: (NOTE) SARS-CoV-2 target nucleic acids are NOT DETECTED.  The SARS-CoV-2 RNA is generally detectable in upper and  lower respiratory specimens during the acute phase of infection. Negative results do not preclude SARS-CoV-2 infection, do not rule out co-infections with other pathogens, and should not be used as the sole basis for treatment or other patient management decisions. Negative results must be combined with clinical observations, patient history, and epidemiological information. The expected result is Negative.  Fact Sheet for Patients: SugarRoll.be  Fact Sheet for Healthcare Providers: https://www.woods-mathews.com/  This test is not yet approved or cleared by the [place] FDA and  has been authorized for detection and/or diagnosis of SARS-CoV-2 by FDA under an Emergency Use Authorization (EUA). This EUA will remain  in effect (meaning this test can be used) for the duration of the COVID-19 declaration under Se ction 564(b)(1) of the Act, 21 U.S.C. section 360bbb-3(b)(1), unless the authorization is terminated or revoked sooner.  Performed at [facility], [place] [address], [place], [place] [address]   Magnesium     Status: None   Collection Time: 08/18/20  8:01 AM  Result Value Ref Range   Magnesium 1.7 1.7 - 2.4 mg/dL    Comment:  Performed at [facility], [address] [place], [place] [address]  Basic metabolic panel     Status: Abnormal   Collection Time: 08/18/20  8:01 AM  Result Value Ref Range   Sodium 134 (L) 135 - 145 mmol/L   Potassium 3.5 3.5 - 5.1 mmol/L   Chloride 98 98 - 111 mmol/L   CO2 27 22 - 32 mmol/L   Glucose, Bld 94 70 - 99 mg/dL    Comment: Glucose reference range applies only to samples taken after fasting for at least 8 hours.   BUN 16 8 - 23 mg/dL   Creatinine, Ser 0.79 0.61 - 1.24 mg/dL   Calcium 9.2 8.9 - 10.3 mg/dL   GFR, Estimated >60 >60 mL/min    Comment: (NOTE) Calculated using the CKD-EPI Creatinine Equation (2021)    Anion gap 9 5 - 15    Comment: Performed at [facility], [address] [place], [place] [address]  C-reactive protein     Status: Abnormal   Collection Time: 09/01/20  1:56 PM  Result Value Ref Range   CRP 1.4 (H) <1.0 mg/dL    Comment: Performed at [facility] [address], [place], [place] [address]  Sedimentation rate     Status: Abnormal   Collection Time: 09/01/20  1:56 PM  Result Value Ref Range   Sed Rate 65 (H) 0 - 20 mm/hr    Comment: Performed at [facility], [address] [place], [place] [address]  Comprehensive metabolic panel     Status: Abnormal   Collection Time: 09/01/20  1:56 PM  Result Value Ref Range   Sodium 134 (L) 135 - 145 mmol/L   Potassium 3.7 3.5 - 5.1 mmol/L   Chloride 99 98 - 111 mmol/L   CO2 25 22 - 32 mmol/L   Glucose, Bld 112 (H) 70 - 99 mg/dL    Comment: Glucose reference range applies only to samples taken after fasting for at least 8 hours.   BUN 23 8 - 23 mg/dL   Creatinine, Ser 0.90 0.61 - 1.24 mg/dL   Calcium 9.2 8.9 - 10.3 mg/dL   Total Protein 7.5 6.5 - 8.1 g/dL   Albumin 3.6 3.5 - 5.0 g/dL   AST 16 15 - 41 U/L   ALT 14 0 - 44 U/L   Alkaline Phosphatase 60 38 - 126 U/L   Total Bilirubin 0.5 0.3 - 1.2 mg/dL   GFR, Estimated >60 >60 mL/min    Comment: (NOTE) Calculated using the CKD-EPI Creatinine Equation (2021)    Anion gap 10 5 - 15    Comment: Performed at [facility], [address] [place], [place] [address]  CK     Status: Abnormal   Collection Time: 09/01/20  1:56 PM  Result Value Ref Range   Total CK 48 (L) 49 - 397 U/L    Comment: Performed at [facility], [address] [place], [place] [address]  CBC with Differential/Platelet     Status: Abnormal   Collection Time: 09/01/20  1:56 PM  Result Value Ref Range   WBC 6.0 4.0 - 10.5 K/uL   RBC 3.34 (L) 4.22 - 5.81 MIL/uL   Hemoglobin 11.7 (L) 13.0 - 17.0 g/dL   HCT 34.1 (L) 39.0 - 52.0 %   MCV 102.1 (H) 80.0 - 100.0 fL   MCH 35.0 (H) 26.0 - 34.0 pg   MCHC 34.3 30.0 - 36.0  g/dL   RDW  12.1 11.5 - 15.5 %   Platelets 297 150 - 400 K/uL   nRBC 0.0 0.0 - 0.2 %   Neutrophils Relative % 62 %   Neutro Abs 3.7 1.7 - 7.7 K/uL   Lymphocytes Relative 21 %   Lymphs Abs 1.3 0.7 - 4.0 K/uL   Monocytes Relative 9 %   Monocytes Absolute 0.5 0.1 - 1.0 K/uL   Eosinophils Relative 7 %   Eosinophils Absolute 0.4 0.0 - 0.5 K/uL   Basophils Relative 1 %   Basophils Absolute 0.1 0.0 - 0.1 K/uL   Immature Granulocytes 0 %   Abs Immature Granulocytes 0.02 0.00 - 0.07 K/uL    Comment: Performed at [facility], [address] [place], [place] [address]    -------------------------------------------------------------------------- A&P:  Problem List Items Addressed This Visit   None   Visit Diagnoses    Neck pain    -  Primary   Chronic right shoulder pain       Muscle spasms of neck          No orders of the defined types were placed in this encounter.  Recent history of foot surgery Followed by [facility] Podiatry Dr Vickki Muff He has had issue with neck / spasm and strain also affecting R shoulder following recovery with foot, related to positional issue with neck and sleeping - he admits some improvement now but has neck spasm and strain - he has failed muscle relaxants in the past. With baclofen and robaxin without success  Discussed that since some improved w/ topical heat, next step can try Topical Voltaren OTC they have some already use 1-4 times a day PRN for pain.  If still not improving can contact us and we can order a Flexeril muscle relaxant option PRN - caution sedation, we can hold this for now. They will notify if needed.  Follow-up MRSA Bactermia Followed by Dr Delaine Lame, she has requested to re-check  CMET + CBC this week. - I called [facility] Dr Vickki Muff and they are willing to order these labs for him tomorrow to save time and travel, and I gave them order info dx code and they will route to me to forward to Dr  Delaine Lame.  Follow-up: PRN  Patient verbalizes understanding with the above medical recommendations including the limitation of remote medical advice.  Specific follow-up and call-back criteria were given for patient to follow-up or seek medical care more urgently if needed.  - Time spent in direct consultation with patient on phone: 15 minutes  [name], [facility] 09/15/2020, 11:45 AM

## 2020-09-15 NOTE — Patient Instructions (Addendum)
Try Voltaren topical Call if need to switch to muscle relaxant  Dr Vickki Muff will order the labs for you. This is a one time option, they cannot order blood from me in future.  Please schedule a Follow-up Appointment to: Return if symptoms worsen or fail to improve.  If you have any other questions or concerns, please feel free to call the office or send a message through [place]. You may also schedule an earlier appointment if necessary.  Additionally, you may be receiving a survey about your experience at our office within a few days to 1 week by e-mail or mail. We value your feedback.  [name], DO [place]

## 2020-09-17 ENCOUNTER — Other Ambulatory Visit: Payer: Self-pay | Admitting: Family Medicine

## 2020-09-17 DIAGNOSIS — R600 Localized edema: Secondary | ICD-10-CM

## 2020-09-17 NOTE — Telephone Encounter (Signed)
Requested medications are due for refill today yes  Requested medications are on the active medication list yes  Last refill 3/8  Last visit Seen for edema 06/2020 (to return in 3 months) 09/2020 seen for neck pain, no mention of edema  Future visit scheduled no  Notes to clinic Passed protocol, however OV note stated to return and this dx/med was not addressed, please assess if to continue.

## 2020-09-19 ENCOUNTER — Other Ambulatory Visit: Payer: Self-pay | Admitting: Family Medicine

## 2020-09-19 DIAGNOSIS — I10 Essential (primary) hypertension: Secondary | ICD-10-CM

## 2020-09-23 ENCOUNTER — Telehealth: Payer: Self-pay

## 2020-09-23 NOTE — Telephone Encounter (Signed)
The pt was notified of Dr. Raliegh Ip recommendation and he verbalize understanding. I gave him Dr.Ravishanker Infectious Disease phone number.

## 2020-09-23 NOTE — Telephone Encounter (Signed)
Actually - this is not quite correct.  The situation was that the [facility] Infectious [facility] doctor he was following - Dr Delaine Lame requested that he have the blood work.  We ordered the blood here to send it to her office.  Patient was not able to come back here due to transportation, he asked [place] if he could have it drawn at [facility] w/ Dr Vickki Muff.  I called Dr Alvera Singh office last week and spoke to them and they don't normally order blood for other doctors, but agreed to do it this one time.  I sent them the information on which labs to order.  The CMET Chemistry and CBC lab were drawn on 09/16/20.  They faxed [place] the results. Dr Alvera Singh office is not responsible for these results. The patient does NOT need to follow-up with Dr Vickki Muff.  I have already sent a message and forwarded the lab results to Dr Gwenevere Ghazi office.  The patient was originally supposed to follow-up with Dr Delaine Lame for the test results.  I can look at the results, but I am not exactly sure what specific info Dr Delaine Lame was looking for - regardless of what results we share with him, he needs to still talk to her office.  Chemistry shows Sodium 128, Potassium 3.2, same as last time checked. Creatinine 0.8, normal kidney function. Normal liver enzymes.  CBC shows Hemoglobin 11.0, which is stable from prior, WBC is 6.8 which is normal range as well.  [name], [facility] 09/23/2020, 1:18 PM

## 2020-09-23 NOTE — Telephone Encounter (Signed)
Copied from [place] [phone]. Topic: General - Other >> Sep 23, 2020 11:31 AM Pawlus, Brayton Layman A wrote: Reason for CRM:  "Pt wanted to know if Dr Raliegh Ip got the test results back from the [facility]. Please call back"   Pt called back stating nobody has called him, is worried about his results, asked for a call back, was not sure why nobody has responded to him yet. Pt stated Dr Raliegh Ip set up his blood work with [facility]. >> Sep 23, 2020  1:47 PM Erick Blinks wrote: Pt returned call, anxiously waiting to speak to clinic about results. Please advise

## 2020-09-23 NOTE — Telephone Encounter (Signed)
Copied from [place] [id number]. Topic: General - Other >> Sep 23, 2020 11:31 AM Pawlus, [name] wrote: Reason for CRM:  "Pt wanted to know if Dr Raliegh Ip got the test results back from the [facility]. Please call back"   Pt called back stating nobody has called him, is worried about his results, asked for [place] call back, was not sure why nobody has responded to him yet. Pt stated Dr Raliegh Ip set up his blood work with [facility].    I just tried to call the patient but the line repeated to ring. It appears that [facility] (Dr. Vickki Muff) ordered the labs therefore he would need to follow up with their office regarding the results.

## 2020-09-23 NOTE — Telephone Encounter (Signed)
Patient called requesting we go over CBC lab Dr Delaine Lame had him have done. I explained that [facility] Dr Vickki Muff ordered that lab. I sent Dr Ola Spurr a message and he reviewed lab since he is covering for Dr Delaine Lame.He agreed there is no infection since the WBC is WNL but asked that we keep his appt scheduled 10/08/2020 10:45 am so she can officially D/C patient from ID if this is the case based on CBC.   I called [name] back and she expressed gratitude and understanding as to the results and the plan to keep appt for patient on 10/08/20.

## 2020-09-23 NOTE — Telephone Encounter (Signed)
Pt is calling and per pt dr Raliegh Ip ordered the labs and dr Vickki Muff is going to forward the results to dr k . Please call patient back

## 2020-09-29 ENCOUNTER — Other Ambulatory Visit: Payer: Self-pay | Admitting: Family Medicine

## 2020-09-29 DIAGNOSIS — E876 Hypokalemia: Secondary | ICD-10-CM

## 2020-09-29 NOTE — Telephone Encounter (Signed)
Requested medications are due for refill today.  yes  Requested medications are on the active medications list.  yes  Last refill. 12/19/2019  Future visit scheduled.   no  Notes to clinic.  Please clarify dosing for Rx. It appears that dose changed at last refill.

## 2020-09-30 MED ORDER — POTASSIUM CHLORIDE CRYS ER 20 MEQ PO TBCR: 20.0000 meq | EXTENDED_RELEASE_TABLET | Freq: Two times a day (BID) | ORAL | 3 refills | Status: AC

## 2020-10-05 ENCOUNTER — Telehealth: Payer: Self-pay

## 2020-10-05 NOTE — Telephone Encounter (Signed)
Called and LM with [name] to await call from [facility] regarding appt since DR Delaine Lame is away until end of May. I also left the number to Dr Tyler Pita office on vm. I will follow up with his nurse [facility] regarding schedule.

## 2020-10-08 ENCOUNTER — Ambulatory Visit: Payer: Medicare Other | Admitting: Infectious Diseases

## 2020-11-07 ENCOUNTER — Other Ambulatory Visit: Payer: Self-pay | Admitting: Family Medicine

## 2020-11-07 DIAGNOSIS — J3089 Other allergic rhinitis: Secondary | ICD-10-CM

## 2020-11-07 NOTE — Telephone Encounter (Signed)
Requested Prescriptions  Pending Prescriptions Disp Refills  . Azelastine HCl 137 MCG/SPRAY SOLN [Pharmacy Med Name: AZELASTINE 0.1% (137 MCG) SPRY] 30 mL 5    Sig: PLACE 1 SPRAY INTO BOTH NOSTRILS 2 (TWO) TIMES DAILY. USE IN EACH NOSTRIL AS DIRECTED     Ear, Nose, and Throat: Nasal Preparations - Antiallergy Passed - 11/07/2020  3:13 PM      Passed - Valid encounter within last 12 months    Recent Outpatient Visits          1 month ago Neck pain   [place], DO   4 months ago Bilateral lower extremity edema   [place], DO   6 months ago Cellulitis of left leg   [place], DO   9 months ago Cellulitis of left leg   [place], DO   10 months ago Primary osteoarthritis involving multiple joints   [place], Devonne [place]ought[place], [place]

## 2020-11-23 NOTE — Telephone Encounter (Signed)
Kim calling from Dr. Samara Deist Podiatrist office is calling to request if home health orders can be extended. With [facility] Pt has been released from Foot restrictions with Dr. Vickki Muff.  [place]- [phone]

## 2020-11-24 ENCOUNTER — Telehealth: Payer: Self-pay | Admitting: Family Medicine

## 2020-11-24 NOTE — Telephone Encounter (Signed)
Patient called to request a referral for OT and PT after his surgery.  Please advise and call to confirm.  CB# [phone]

## 2020-11-24 NOTE — Telephone Encounter (Signed)
[name] can extend.  [name], [name] 11/24/2020, 3:15 PM

## 2020-11-25 ENCOUNTER — Telehealth: Payer: Self-pay

## 2020-11-25 DIAGNOSIS — L97522 Non-pressure chronic ulcer of other part of left foot with fat layer exposed: Secondary | ICD-10-CM

## 2020-11-25 NOTE — Telephone Encounter (Signed)
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  He has been followed by Dr Delaine Lame ID and Dr Vickki Muff [facility] Podiatry.  I am concerned that if this is a recurrence, he would need further evaluation from them, given the severity of this problem in recent history.  I have ordered CMET CBC ESR CRP labs to be drawn here at [facility] for tomorrow 11/26/20 if that is patient's goal.  I do see on the chart they have contacted [facility] to request follow-up with Dr Delaine Lame as well.  I have CC"d Dr Delaine Lame on this message for review also.  If patient wants to get labs here and we can forward results then that is fine. But he will likely need further management from ID and or Podiatry for this particular problem.  [name], [facility] 11/25/2020, 6:26 PM

## 2020-11-25 NOTE — Telephone Encounter (Signed)
Patient called [facility] voicemail expressing concern about swelling in foot; concerned about recurrence of infection. Requesting to get labs drawn/see Dr. Alberteen Spindle, RN

## 2020-11-25 NOTE — Telephone Encounter (Signed)
Copied from [place] [id number]. Topic: Appointment Scheduling - Scheduling Inquiry for Clinic >> Nov 25, 2020  3:02 PM Valere Dross wrote: Reason for CRM: Pt called in stating he has had an infection on his foot, and back in march had Dr Vickki Muff see him for that, and he recently saw the pt again on Wednesday 11/18/20, stated that the pt is able to walk on that foot again. Pt is concerned and wanting to get some labs done, because he said he not feeling well. Pt requested a call back asap, to talk more about., and get some blood drawn.    He said that he woke up this morning and it was red and hot. He said they are no longer seeing Dr Vickki Muff but gave no info as to why. I advised that I would see about getting labs ordered to check his WBC count and that they could come in tomorrow at 11:30 for labs.

## 2020-11-26 ENCOUNTER — Other Ambulatory Visit: Payer: Self-pay

## 2020-11-26 ENCOUNTER — Ambulatory Visit: Payer: Medicare Other | Attending: Infectious Diseases | Admitting: Infectious Diseases

## 2020-11-26 ENCOUNTER — Other Ambulatory Visit: Payer: Medicare Other

## 2020-11-26 ENCOUNTER — Ambulatory Visit: Payer: Medicare Other | Admitting: Infectious Diseases

## 2020-11-26 ENCOUNTER — Other Ambulatory Visit
Admission: RE | Admit: 2020-11-26 | Discharge: 2020-11-26 | Disposition: A | Discharge status: Home / Self Care | Payer: Medicare Other | Source: Ambulatory Visit | Attending: Infectious Diseases | Admitting: Infectious Diseases

## 2020-11-26 VITALS — BP 134/83 | HR 77 | Temp 97.8°F | Resp 16 | Ht 72.0 in | Wt 194.0 lb

## 2020-11-26 DIAGNOSIS — Z8249 Family history of ischemic heart disease and other diseases of the circulatory system: Secondary | ICD-10-CM | POA: Insufficient documentation

## 2020-11-26 DIAGNOSIS — I2510 Atherosclerotic heart disease of native coronary artery without angina pectoris: Secondary | ICD-10-CM | POA: Insufficient documentation

## 2020-11-26 DIAGNOSIS — R7881 Bacteremia: Secondary | ICD-10-CM | POA: Diagnosis present

## 2020-11-26 DIAGNOSIS — G629 Polyneuropathy, unspecified: Secondary | ICD-10-CM | POA: Insufficient documentation

## 2020-11-26 DIAGNOSIS — Z89412 Acquired absence of left great toe: Secondary | ICD-10-CM | POA: Insufficient documentation

## 2020-11-26 DIAGNOSIS — Z79899 Other long term (current) drug therapy: Secondary | ICD-10-CM | POA: Insufficient documentation

## 2020-11-26 DIAGNOSIS — Z7982 Long term (current) use of aspirin: Secondary | ICD-10-CM | POA: Diagnosis not present

## 2020-11-26 DIAGNOSIS — Z951 Presence of aortocoronary bypass graft: Secondary | ICD-10-CM | POA: Insufficient documentation

## 2020-11-26 DIAGNOSIS — Z8614 Personal history of Methicillin resistant Staphylococcus aureus infection: Secondary | ICD-10-CM | POA: Diagnosis not present

## 2020-11-26 DIAGNOSIS — I10 Essential (primary) hypertension: Secondary | ICD-10-CM | POA: Diagnosis not present

## 2020-11-26 DIAGNOSIS — Z885 Allergy status to narcotic agent status: Secondary | ICD-10-CM | POA: Insufficient documentation

## 2020-11-26 DIAGNOSIS — L03116 Cellulitis of left lower limb: Secondary | ICD-10-CM | POA: Diagnosis present

## 2020-11-26 DIAGNOSIS — B9562 Methicillin resistant Staphylococcus aureus infection as the cause of diseases classified elsewhere: Secondary | ICD-10-CM

## 2020-11-26 DIAGNOSIS — Z888 Allergy status to other drugs, medicaments and biological substances status: Secondary | ICD-10-CM | POA: Insufficient documentation

## 2020-11-26 LAB — CBC WITH DIFFERENTIAL/PLATELET
Abs Immature Granulocytes: 0.02 10*3/uL (ref 0.00–0.07)
Basophils Absolute: 0 10*3/uL (ref 0.0–0.1)
Basophils Relative: 0 %
Eosinophils Absolute: 0.1 10*3/uL (ref 0.0–0.5)
Eosinophils Relative: 3 %
HCT: 31.6 % — ABNORMAL LOW (ref 39.0–52.0)
Hemoglobin: 11.8 g/dL — ABNORMAL LOW (ref 13.0–17.0)
Immature Granulocytes: 0 %
Lymphocytes Relative: 32 %
Lymphs Abs: 1.5 10*3/uL (ref 0.7–4.0)
MCH: 35.6 pg — ABNORMAL HIGH (ref 26.0–34.0)
MCHC: 37.3 g/dL — ABNORMAL HIGH (ref 30.0–36.0)
MCV: 95.5 fL (ref 80.0–100.0)
Monocytes Absolute: 0.4 10*3/uL (ref 0.1–1.0)
Monocytes Relative: 9 %
Neutro Abs: 2.6 10*3/uL (ref 1.7–7.7)
Neutrophils Relative %: 56 %
Platelets: 210 10*3/uL (ref 150–400)
RBC: 3.31 MIL/uL — ABNORMAL LOW (ref 4.22–5.81)
RDW: 12.3 % (ref 11.5–15.5)
WBC: 4.6 10*3/uL (ref 4.0–10.5)
nRBC: 0 % (ref 0.0–0.2)

## 2020-11-26 LAB — COMPREHENSIVE METABOLIC PANEL
ALT: 17 U/L (ref 0–44)
AST: 21 U/L (ref 15–41)
Albumin: 4.4 g/dL (ref 3.5–5.0)
Alkaline Phosphatase: 59 U/L (ref 38–126)
Anion gap: 9 (ref 5–15)
BUN: 17 mg/dL (ref 8–23)
CO2: 29 mmol/L (ref 22–32)
Calcium: 9.2 mg/dL (ref 8.9–10.3)
Chloride: 83 mmol/L — ABNORMAL LOW (ref 98–111)
Creatinine, Ser: 0.73 mg/dL (ref 0.61–1.24)
GFR, Estimated: >60 mL/min (ref >60)
Glucose, Bld: 97 mg/dL (ref 70–99)
Potassium: 3.5 mmol/L (ref 3.5–5.1)
Sodium: 121 mmol/L — ABNORMAL LOW (ref 135–145)
Total Bilirubin: 0.6 mg/dL (ref 0.3–1.2)
Total Protein: 7.5 g/dL (ref 6.5–8.1)

## 2020-11-26 MED ORDER — LINEZOLID 600 MG PO TABS: 600.0000 mg | ORAL_TABLET | Freq: Two times a day (BID) | ORAL | 0 refills | Status: DC

## 2020-11-26 NOTE — Patient Instructions (Signed)
You are here for left ;eg swelling and redness following a wound you have on the back of your leg from a scratch on the wheel chair- looks like you have cellulitis- will get a wound culture- cbc, cmp and start on linezolid 600mg  tablet one every 12 hours- while on linezolid avoid food that contains high tryptophan like vinegar, sauer krat, wine, fermented cheese- dont start any new medications without consulting me.

## 2020-11-26 NOTE — Telephone Encounter (Signed)
Patient called in stating wanting to start back up [place] was requesting orders for PT. With Frequency- 2 times a week for 3 weeks, 1 time a week for 5 weeks. Please advise [phone]

## 2020-11-26 NOTE — Progress Notes (Signed)
NAME: [name]  DOB: 1935-07-17  MRN: [id number]  Date/Time: 11/26/2020 12:30 PM  Subjective:  85 year old male with a history of hypertension, coronary artery disease, polyneuropathy, left foot infection with MRSA, history of MRSA bacteremia is here as a walk-in for left leg swelling and redness. ?pt here with his friend who is his care taker He is here because he is concerned that the leg is red and swollen and wants lab work He had a new wound on the back of his knee from rubbing on  the wheel chair and sustained an injury which looked like a carper burn- his care taker is putting a cream ( calendula- first aid cream) pt has no fever , chills,  He has a h/o left foot infection with MRSA . The past history is a s follows   Pt was in [facility] between 2/27 until 08/18/20 for MRSA bacteremia and left foot infection- HE underwent abscess drainage and debridement of left foot infection on 08/12/20. Wound vac placed and [facility] on Daptomycin. He completed IV antibiotic on 09/03/20 and took 2 weeks of PO linezolid following that which he completed on 09/17/20. HE has been followed by Dr.Fowler  podiatrist and has affinity Graft 3 on the wound on the left heel which has healed pretty much now. He last saw Dr.Fowler on 11/18/20 and he was happy with the way the foot looked and and has given a follow-up for him in 3 weeks time.  Patient sits with his foot dependent all the time  Past Medical History:  Diagnosis Date   Arthritis    Benign essential tremor    Fracture, femur ([place]) 2012   GERD (gastroesophageal reflux disease)    H/O echocardiogram    05/2013   Hx of colonoscopy    04/22/2013   Hypercholesterolemia    Hypertension    Myocardial infarction [facility])    1987   Polyneuropathy    Prostate cancer ([place]) 2004   Superficial hematoma     Past Surgical History:  Procedure Laterality Date   APPLICATION OF WOUND VAC Left 08/12/2020   Procedure: APPLICATION OF WOUND VAC;  Surgeon:  Samara Deist, DPM;  Location: [facility] ORS;  Service: Podiatry;  Laterality: Left;   BASAL CELL CARCINOMA EXCISION     COLONOSCOPY  05/01/2009   Positive for colonic polyps   COLONOSCOPY WITH PROPOFOL N/A 02/24/2016   Procedure: COLONOSCOPY WITH PROPOFOL;  Surgeon: Christene Lye, MD;  Location: [facility] ENDOSCOPY;  Service: Endoscopy;  Laterality: N/A;   CORONARY ARTERY BYPASS GRAFT  1987   [place]   PROSTATE SURGERY  2004   TEE WITHOUT CARDIOVERSION N/A 08/13/2020   Procedure: TRANSESOPHAGEAL ECHOCARDIOGRAM (TEE);  Surgeon: Corey Skains, MD;  Location: [facility] ORS;  Service: Cardiovascular;  Laterality: N/A;   WOUND DEBRIDEMENT Left 08/12/2020   Procedure: DEBRIDEMENT WOUND-Left Heel;  Surgeon: Samara Deist, DPM;  Location: [facility] ORS;  Service: Podiatry;  Laterality: Left;    Social History   Socioeconomic History   Marital status: Married    Spouse name: Not on file   Number of children: Not on file   Years of education: Not on file   Highest education level: Not on file  Occupational History   Not on file  Tobacco Use   Smoking status: Never   Smokeless tobacco: Never  Vaping Use   Vaping Use: Never used  Substance and Sexual Activity   Alcohol use: Yes  Alcohol/week: 7.0 standard drinks    Types: 7 Glasses of wine per week    Comment: wine in the evening with dinner   Drug use: No   Sexual activity: Not on file  Other Topics Concern   Not on file  Social History Narrative   Not on file   Social Determinants of Health   Financial Resource Strain: Not on file  Food Insecurity: Not on file  Transportation Needs: Not on file  Physical Activity: Not on file  Stress: Not on file  Social Connections: Not on file  Intimate Partner Violence: Not on file    Family History  Problem Relation Age of Onset   Heart disease Mother    Congestive Heart Failure Father    Allergies  Allergen Reactions   Codeine Nausea And Vomiting   Indocin  [Indomethacin] Other (See Comments)   Latex Other (See Comments)    Blisters   Mupirocin     Blisters   Plavix [Clopidogrel Bisulfate]     GI intolerance, n/v   Polysporin [Bacitracin-Polymyxin B]     Blisters   Shellfish Allergy    Tape    Neosporin [Neomycin-Bacitracin Zn-Polymyx] Rash   Other Nausea And Vomiting    mussels   I? Current Outpatient Medications  Medication Sig Dispense Refill   Alpha Lipoic Acid 200 MG CAPS Take 2 capsules by mouth daily.     amLODipine (NORVASC) 2.5 MG tablet TAKE 1 TABLET BY MOUTH  DAILY (Patient taking differently: Take 2.5 mg by mouth daily.) 90 tablet 3   Apoaequorin 10 MG CAPS Take 1 capsule by mouth as directed.     aspirin 81 MG tablet Take 162 mg by mouth at bedtime.     Azelastine HCl 137 MCG/SPRAY SOLN PLACE 1 SPRAY INTO BOTH NOSTRILS 2 (TWO) TIMES DAILY. USE IN EACH NOSTRIL AS DIRECTED 30 mL 5   B Complex Vitamins (VITAMIN-B COMPLEX PO) Take 1 tablet by mouth daily.     Biotin 1000 MCG tablet Take 1,000 mcg by mouth daily.     Calcium Carb-Cholecalciferol 500-100 MG-UNIT CHEW Chew 500 mg by mouth daily.     cyanocobalamin 1000 MCG tablet Take 1,000 mcg by mouth daily.     diphenhydrAMINE (BENADRYL) 25 MG tablet Take 25 mg by mouth at bedtime as needed for allergies.     folic acid (FOLVITE) 1 MG tablet Take 1 mg by mouth daily.     furosemide (LASIX) 40 MG tablet TAKE 1 TABLET BY MOUTH EVERY DAY 30 tablet 2   gabapentin (NEURONTIN) 600 MG tablet TAKE 2 TABLETS BY MOUTH  TWICE DAILY (Patient taking differently: Take 1,200 mg by mouth 2 (two) times daily.) 360 tablet 3   Glucosamine Sulfate 1000 MG CAPS Take 1 capsule by mouth daily.     Lecithin 400 MG CAPS Take 400 mg by mouth daily.     linezolid (ZYVOX) 600 MG tablet Take 1 tablet (600 mg total) by mouth 2 (two) times daily. 28 tablet 0   melatonin 5 MG TABS Take 5 mg by mouth at bedtime.     Omega 3 1000 MG CAPS Take 1,000 mg by mouth daily.     omeprazole (PRILOSEC) 20 MG capsule  TAKE 1 CAPSULE BY MOUTH  TWICE DAILY BEFORE MEALS (Patient taking differently: Take 20 mg by mouth 2 (two) times daily before a meal.) 180 capsule 3   potassium chloride SA (KLOR-CON) 20 MEQ tablet Take 1 tablet (20 mEq total) by mouth 2 (two)  times daily. 180 tablet 3   pravastatin (PRAVACHOL) 20 MG tablet Take 4 tablets (80 mg total) by mouth daily.     primidone (MYSOLINE) 50 MG tablet TAKE 1 TABLET BY MOUTH 3  TIMES DAILY (Patient taking differently: Take 50-100 mg by mouth See admin instructions. Take 2 tablets (100mg ) by mouth every morning and take 1 tablet (50mg ) by mouth every night) 270 tablet 3   Selenium 200 MCG CAPS Take 200 mcg by mouth daily.     tretinoin (RETIN-A) 0.05 % cream Apply 1 application topically at bedtime.     triamcinolone (KENALOG) 0.1 % Apply 1 application topically 2 (two) times daily. (apply to hands, legs and feet)     No current facility-administered medications for this visit.     Abtx:  Anti-infectives (From admission, onward)    None       REVIEW OF SYSTEMS:  Const: negative fever, negative chills, negative weight loss Eyes: negative diplopia or visual changes, negative eye pain ENT: negative coryza, negative sore throat Resp: negative cough, hemoptysis, dyspnea Cards: negative for chest pain, palpitations, lower extremity edema GU: negative for frequency, dysuria and hematuria GI: Negative for abdominal pain, diarrhea, bleeding, constipation Skin: negative for rash and pruritus Heme: negative for easy bruising and gum/nose bleeding MS: As above Neurolo:neuropathy hands and feet Psych: negative for feelings of anxiety, depression  Endocrine: negative for thyroid, diabetes Allergy/Immunology- as above ?  Objective:  VITALS:  BP 134/83   Pulse 77   Temp 97.8 F (36.6 C)   Resp 16   Ht 6' (1.829 m)   Wt 194 lb (88 kg)   SpO2 95%   BMI 26.31 kg/m  PHYSICAL EXAM:  General: Alert, cooperative, no distress, appears stated age. In wheel  chair ENT cannot be examined because of mask Neck: Supple, symmetrical, no adenopathy, thyroid: non tender no carotid bruit and no JVD. Back: In wheelchair Lungs: Clear to auscultation bilaterally. No Wheezing or Rhonchi. No rales. Heart: Regular rate and rhythm, no murmur, rub or gallop. Abdomen: Not examined  extremities: left foot swollen, erythematous, not warm     Left foot calcaneum  Left knee    March 2022         Skin: No rashes or lesions. Or bruising Lymph: Cervical, supraclavicular N  CNS claw hands  Pertinent Labs None currently    ? Impression/Recommendation  Left leg cellulitis secondary to a wound on the back of his knee. Did a culture of the wound We will start him on linezolid as he has a history of MRSA in the past Asked him to keep the leg elevated as he has a component of venous edema. ? History of MRSA bacteremia secondary to MRSA left foot infection.  Status post debridement of the abscess.  Left ankle joint was not involved by the infection.  TEE was negative.  Repeat cultures were negative.  He was adequately treated with IV daptomycin followed by linezolid for nearly 4 weeks.  History of left great toe amputation  CAD status post CABG On Pravachol and aspirin  Peripheral neuropathy  We will do labs today. ___________________________________________________ Discussed with patient, and his friend. Explained the side effects from linezolid and food and medications to avoid.  We will see him by televisit next week. Note:  This document was prepared using Dragon voice recognition software and may include unintentional dictation errors.

## 2020-11-27 NOTE — Telephone Encounter (Signed)
That is fine he can resume PT  [name], [facility] 11/27/2020, 11:56 AM

## 2020-11-29 LAB — AEROBIC CULTURE W GRAM STAIN (SUPERFICIAL SPECIMEN): Gram Stain: NONE SEEN

## 2020-11-30 ENCOUNTER — Telehealth: Payer: Self-pay

## 2020-11-30 DIAGNOSIS — M21371 Foot drop, right foot: Secondary | ICD-10-CM

## 2020-11-30 DIAGNOSIS — L97522 Non-pressure chronic ulcer of other part of left foot with fat layer exposed: Secondary | ICD-10-CM

## 2020-11-30 NOTE — Telephone Encounter (Signed)
Copied from [place] [id number]. Topic: Referral - Request for Referral >> Nov 24, 2020  2:20 PM Tessa Lerner A wrote: Has patient seen PCP for this complaint? Yes.    *If NO, is insurance requiring patient see PCP for this issue before PCP can refer them?  Referral for which specialty: [place]  Preferred provider/office: [facility]  Reason for referral: Patient needs assistance with in home PT  Relevant information regarding this referral can be faxed to [facility] at [phone] >> Nov 30, 2020  1:43 PM [place], Brayton Layman A wrote: Pt is following up regarding his request for Home health PT, pt stated he called [facility] and they have no orders, pt requested a call back today to find out what is going on.

## 2020-11-30 NOTE — Telephone Encounter (Signed)
Pt is calling very frustrated that he has called 3 times requesting an order with [facility] for PT & OT. Pt needs assistance walking.  Pt states it does not have to be [facility] that the orders go through. Please advise Cb- [phone]

## 2020-11-30 NOTE — Telephone Encounter (Signed)
New referral placed to [facility] / OT services to continue [facility]  -----------   Ambulatory referral to [facility] (Order [id number]) Outpatient Referral Date: 11/30/2020 Department: [facility] Ordering/Authorizing: [name], DO       [name], DO NPI: [id number]     Patient Information  Patient Name  [name], [name] Legal Sex  Male DOB  Sep 23, 1935 SSN  [id number]    Order Information  Order Date/Time Release Date/Time Start Date/Time End Date/Time  11/30/20 04:19 PM None 11/30/2020 None    Order History Outpatient Date/Time Action Taken User Additional Information  11/30/20 1619 Sign [name], DO     Order Questions  Question Answer Comment  Does the patient have Medicare or Medicaid? Yes medicare  The encounter with the patient was in whole, or in part, for the following medical condition, which is the primary reason for home health care chronic foot ulcer with bilateral foot drop   Reason for Medically [place] Therapy- Gait Training, Transfer Training and Stair Training    Therapy- Instruction on Safe use of Assistive Devices for ADLs    Therapy- Home Adaptation to Facilitate Safety    Therapy- Therapeutic Exercises to Increase Strength and Endurance    Other See Comments   My clinical findings support the need for the above services Unable to leave home safely without assistance and/or assistive device   I certify that, based on my findings, the following services are medically necessary home health services Physical therapy   Further, I certify that my clinical findings support that this patient is homebound due to: Unable to leave home safely without assistance          Comments  Please evaluate [name] for admission to [facility].   Disciplines requested: Physical Therapy and Occupational Therapy   Services to provide: Strengthening Exercises and Evaluate    Physician to follow patient's care (the person listed here will be responsible for signing ongoing orders): PCP   Requested Start of Care Date: Within 2-3 days   I certify that this patient is under my care and that I, or a Nurse Practitioner or Physician's Assistant working with me, had a face-to-face encounter that meets the physician face-to-face requirements with patient on 09/15/20. The encounter with the patient was in whole, or in part for the following medical condition(s) which is the primary reason for home health care (List medical condition). Chronic foot ulcer with bilateral foot drop   Special Instructions:  Not a new referral but order to resume previously active [place], his service was interrupted due to foot ulcer and limited mobility post-op for foot.

## 2020-11-30 NOTE — Telephone Encounter (Signed)
I have spoken with Judson Roch with [facility] who requested the new order. I told her that it would go through the system to referrals and be sent that way. I spoke with Maudie Mercury with Podiatry at [facility] to verify that it needed to come from [place] and not their dept.

## 2020-12-03 ENCOUNTER — Other Ambulatory Visit: Payer: Self-pay | Admitting: Family Medicine

## 2020-12-03 ENCOUNTER — Telehealth: Payer: Self-pay

## 2020-12-03 DIAGNOSIS — R600 Localized edema: Secondary | ICD-10-CM

## 2020-12-03 NOTE — Telephone Encounter (Signed)
Spoke to patient and wife and she stated that  he has one more day f the Linzolid left and he has not missed any doses. Advised of positive culture and will discuss more on Tuesday during phone visit.

## 2020-12-08 ENCOUNTER — Other Ambulatory Visit: Payer: Self-pay

## 2020-12-08 ENCOUNTER — Ambulatory Visit: Payer: Medicare Other | Attending: Infectious Diseases | Admitting: Infectious Diseases

## 2020-12-08 DIAGNOSIS — L03116 Cellulitis of left lower limb: Secondary | ICD-10-CM | POA: Diagnosis not present

## 2020-12-08 NOTE — Progress Notes (Signed)
The purpose of this virtual visit is to provide medical care while limiting exposure to the novel coronavirus (COVID19) for both patient and office staff.   Consent was obtained for phone visit:  Yes.   Answered questions that patient had about telehealth interaction:  Yes.   I discussed the limitations, risks, security and privacy concerns of performing an evaluation and management service by telephone. I also discussed with the patient that there may be a patient responsible charge related to this service. The patient expressed understanding and agreed to proceed.   Patient Location: Home Provider Location: office On the phone, patient , his wife nancy and provider Pt has h/o MRSA bacteremia, MRSA left foot infection s/p I/D of infection in March 2022 and was treated adequately with antibiotics- is followed by Dr.Fowler podiatrist who has been satisifed with his progress  - saw him on 11/26/20 for left leg swelling- wound behind the knee from wheel chair  Had taken culture and prescribed linezolid The culture came back as MRSA He has completed the treatment and is doing very well Says leg is not swollen or red any more Had PT session today and was told that he was doing well No side effects from medicine Will be seeing his PCP on Thursday and is planning to get some lab work will see him PRN Discussed the management with patient and his wife in detail Total time spent 8 min

## 2020-12-10 ENCOUNTER — Other Ambulatory Visit: Payer: Self-pay

## 2020-12-10 ENCOUNTER — Encounter: Payer: Self-pay | Admitting: Family Medicine

## 2020-12-10 ENCOUNTER — Ambulatory Visit (INDEPENDENT_AMBULATORY_CARE_PROVIDER_SITE_OTHER): Payer: Medicare Other | Admitting: Family Medicine

## 2020-12-10 VITALS — BP 113/61 | HR 78 | Ht 72.0 in | Wt 207.2 lb

## 2020-12-10 DIAGNOSIS — A4902 Methicillin resistant Staphylococcus aureus infection, unspecified site: Secondary | ICD-10-CM

## 2020-12-10 DIAGNOSIS — E871 Hypo-osmolality and hyponatremia: Secondary | ICD-10-CM

## 2020-12-10 DIAGNOSIS — L97522 Non-pressure chronic ulcer of other part of left foot with fat layer exposed: Secondary | ICD-10-CM

## 2020-12-10 DIAGNOSIS — I10 Essential (primary) hypertension: Secondary | ICD-10-CM

## 2020-12-10 DIAGNOSIS — R600 Localized edema: Secondary | ICD-10-CM

## 2020-12-10 LAB — CBC WITH DIFFERENTIAL/PLATELET
Absolute Monocytes: 817 cells/uL (ref 200–950)
Basophils Absolute: 18 cells/uL (ref 0–200)
Basophils Relative: 0.3 %
Eosinophils Absolute: 220 cells/uL (ref 15–500)
Eosinophils Relative: 3.6 %
HCT: 33.1 % — ABNORMAL LOW (ref 38.5–50.0)
Hemoglobin: 11.3 g/dL — ABNORMAL LOW (ref 13.2–17.1)
Lymphs Abs: 1312 cells/uL (ref 850–3900)
MCH: 36 pg — ABNORMAL HIGH (ref 27.0–33.0)
MCHC: 34.1 g/dL (ref 32.0–36.0)
MCV: 105.4 fL — ABNORMAL HIGH (ref 80.0–100.0)
MPV: 9.6 fL (ref 7.5–12.5)
Monocytes Relative: 13.4 %
Neutro Abs: 3733 cells/uL (ref 1500–7800)
Neutrophils Relative %: 61.2 %
Platelets: 164 10*3/uL (ref 140–400)
RBC: 3.14 10*6/uL — ABNORMAL LOW (ref 4.20–5.80)
RDW: 12.4 % (ref 11.0–15.0)
Total Lymphocyte: 21.5 %
WBC: 6.1 10*3/uL (ref 3.8–10.8)

## 2020-12-10 LAB — BASIC METABOLIC PANEL WITH GFR
BUN: 19 mg/dL (ref 7–25)
CO2: 30 mmol/L (ref 20–32)
Calcium: 9.2 mg/dL (ref 8.6–10.3)
Chloride: 87 mmol/L — ABNORMAL LOW (ref 98–110)
Creat: 0.77 mg/dL (ref 0.70–1.11)
GFR, Est African American: 97 mL/min/{1.73_m2} (ref >=60)
GFR, Est Non African American: 84 mL/min/{1.73_m2} (ref >=60)
Glucose, Bld: 93 mg/dL (ref 65–99)
Potassium: 3.7 mmol/L (ref 3.5–5.3)
Sodium: 124 mmol/L — ABNORMAL LOW (ref 135–146)

## 2020-12-10 NOTE — Patient Instructions (Addendum)
Thank you for coming to the office today.  Labs today to check kidney sodium chemistry and blood count WBC.  Starting today you can take HALF pill of Furosemide 40mg  - down to 20mg  daily now.  Once I review the blood work we can order a new rx Furosemide rx back to the 20mg  dose daily  Can attach photo of wound to chart for Dr Delaine Lame   Please schedule a Follow-up Appointment to: Return if symptoms worsen or fail to improve.  If you have any other questions or concerns, please feel free to call the office or send a message through [place]. You may also schedule an earlier appointment if necessary.  Additionally, you may be receiving a survey about your experience at our office within a few days to 1 week by e-mail or mail. We value your feedback.  [name], DO [place]

## 2020-12-10 NOTE — Progress Notes (Signed)
Subjective:    Patient ID: [name], male    DOB: 11/04/1935, 85 y.o.   MRN: [id number]  [name] is a 85 y.o. male presenting on 12/10/2020 for Peripheral Neuropathy and Leg Problem   HPI  Interval update  Hospitalization [facility] Feb-March 2022 MRSA Bacteremia, chronic L Foot ulcer/infection Abscess I&D procedure 08/12/20 On wound vac, and [facility], completed antibiotics IV. Transitioned to Linezolid PO Treated with Affinity Graft on skin of Left heel  Followed by Podiatry Dr Vickki Muff and Dr Delaine Lame ID  Has been managed by ID on PO Linezolid for cellulitis, recent culture confirm MRSA  [facility] has initiated PT on Tuesday this week and has seen OT.  Next apt 12/23/20 w Podiatry  Wound culture showed MRSA.  Additional topics  Hypertension Chronic Hyponatremia Last lab 6/16 showed Sodium 121, lower than avg baseline History on Furosemide 20mg  in past 06/2020 was increase to 40mg  due to swelling worsening in LE. He remains on Potassium now at 75mEq once daily Today has had overall improved swelling in lower extremity Due for lab repeat today   Depression screen [facility] 2/9 12/19/2019 03/20/2019 02/05/2019  Decreased Interest 0 0 0  Down, Depressed, Hopeless 0 0 0  PHQ - 2 Score 0 0 0    Social History   Tobacco Use   Smoking status: Never   Smokeless tobacco: Never  Vaping Use   Vaping Use: Never used  Substance Use Topics   Alcohol use: Yes    Alcohol/week: 7.0 standard drinks    Types: 7 Glasses of wine per week    Comment: wine in the evening with dinner   Drug use: No    Review of Systems Per HPI unless specifically indicated above     Objective:    BP 113/61   Pulse 78   Ht 6' (1.829 m)   Wt 207 lb 3.2 oz (94 kg)   SpO2 100%   BMI 28.10 kg/m   Wt Readings from Last 3 Encounters:  12/10/20 207 lb 3.2 oz (94 kg)  11/26/20 194 lb (88 kg)  09/15/20 194 lb (88 kg)    Physical Exam Vitals and nursing note reviewed.  Constitutional:       General: He is not in acute distress.    Appearance: Normal appearance. He is well-developed. He is not diaphoretic.     Comments: Well-appearing, comfortable, cooperative  HENT:     Head: Normocephalic and atraumatic.  Eyes:     General:        Right eye: No discharge.        Left eye: No discharge.     Conjunctiva/sclera: Conjunctivae normal.  Cardiovascular:     Rate and Rhythm: Normal rate and regular rhythm.     Pulses: Normal pulses.     Heart sounds: Normal heart sounds. No murmur heard. Pulmonary:     Effort: Pulmonary effort is normal.  Musculoskeletal:     Right lower leg: No edema.     Left lower leg: No edema.  Skin:    General: Skin is warm and dry.     Findings: No erythema or rash.     Comments: See pictures of Left foot. No open active ulceration  Neurological:     Mental Status: He is alert and oriented to person, place, and time.  Psychiatric:        Mood and Affect: Mood normal.        Behavior: Behavior normal.  Thought Content: Thought content normal.     Comments: Well groomed, good eye contact, normal speech and thoughts    Left Foot.         Results for orders placed or performed during the hospital encounter of 11/26/20  Aerobic Culture w Gram Stain (superficial specimen)   Specimen: Wound  Result Value Ref Range   Specimen Description      WOUND Performed at [facility], [address], [place], [place] [address]    Special Requests      LEFT FOOT Performed at [facility], [place], [place] [address]    Gram Stain      NO WBC SEEN FEW GRAM POSITIVE COCCI IN PAIRS Performed at [facility], [place] [address] [place], [place] [address]    Culture      MODERATE METHICILLIN RESISTANT STAPHYLOCOCCUS AUREUS   Report Status 11/29/2020 FINAL    Organism ID, Bacteria METHICILLIN RESISTANT STAPHYLOCOCCUS AUREUS       Susceptibility   Methicillin resistant staphylococcus aureus - MIC*     CIPROFLOXACIN 4 RESISTANT Resistant     ERYTHROMYCIN <=0.25 SENSITIVE Sensitive     GENTAMICIN <=0.5 SENSITIVE Sensitive     OXACILLIN >=4 RESISTANT Resistant     TETRACYCLINE >=16 RESISTANT Resistant     VANCOMYCIN 1 SENSITIVE Sensitive     TRIMETH/SULFA <=10 SENSITIVE Sensitive     CLINDAMYCIN <=0.25 SENSITIVE Sensitive     RIFAMPIN <=0.5 SENSITIVE Sensitive     Inducible Clindamycin NEGATIVE Sensitive     * MODERATE METHICILLIN RESISTANT STAPHYLOCOCCUS AUREUS  Comprehensive metabolic panel  Result Value Ref Range   Sodium 121 (L) 135 - 145 mmol/L   Potassium 3.5 3.5 - 5.1 mmol/L   Chloride 83 (L) 98 - 111 mmol/L   CO2 29 22 - 32 mmol/L   Glucose, Bld 97 70 - 99 mg/dL   BUN 17 8 - 23 mg/dL   Creatinine, Ser 0.73 0.61 - 1.24 mg/dL   Calcium 9.2 8.9 - 10.3 mg/dL   Total Protein 7.5 6.5 - 8.1 g/dL   Albumin 4.4 3.5 - 5.0 g/dL   AST 21 15 - 41 U/L   ALT 17 0 - 44 U/L   Alkaline Phosphatase 59 38 - 126 U/L   Total Bilirubin 0.6 0.3 - 1.2 mg/dL   GFR, Estimated >60 >60 mL/min   Anion gap 9 5 - 15  CBC with Differential/Platelet  Result Value Ref Range   WBC 4.6 4.0 - 10.5 K/uL   RBC 3.31 (L) 4.22 - 5.81 MIL/uL   Hemoglobin 11.8 (L) 13.0 - 17.0 g/dL   HCT 31.6 (L) 39.0 - 52.0 %   MCV 95.5 80.0 - 100.0 fL   MCH 35.6 (H) 26.0 - 34.0 pg   MCHC 37.3 (H) 30.0 - 36.0 g/dL   RDW 12.3 11.5 - 15.5 %   Platelets 210 150 - 400 K/uL   nRBC 0.0 0.0 - 0.2 %   Neutrophils Relative % 56 %   Neutro Abs 2.6 1.7 - 7.7 K/uL   Lymphocytes Relative 32 %   Lymphs Abs 1.5 0.7 - 4.0 K/uL   Monocytes Relative 9 %   Monocytes Absolute 0.4 0.1 - 1.0 K/uL   Eosinophils Relative 3 %   Eosinophils Absolute 0.1 0.0 - 0.5 K/uL   Basophils Relative 0 %   Basophils Absolute 0.0 0.0 - 0.1 K/uL   Immature Granulocytes 0 %   Abs Immature Granulocytes 0.02 0.00 -  0.07 K/uL      Assessment & Plan:   Problem List Items Addressed This Visit     Essential hypertension - Primary   Relevant Medications    furosemide (LASIX) 40 MG tablet   Other Relevant Orders   BASIC METABOLIC PANEL WITH GFR   Bilateral lower extremity edema   Relevant Medications   furosemide (LASIX) 40 MG tablet   Other Relevant Orders   CBC with Differential/Platelet   Other Visit Diagnoses     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)       Relevant Orders   CBC with Differential/Platelet   MRSA infection       Chronic hyponatremia       Relevant Orders   BASIC METABOLIC PANEL WITH GFR       MRSA Chronic L Foot ulceration Followed by Dr Vickki Muff [facility], Dr Sidney Ace ID Completed Linezolid PO Improved overall, today appears to have resolved cellulitis, no open active ulceration. Wound is healed from graft. Will take photos through [facility] and attach to chart and forward to Dr Delaine Lame for review.  Labs today BMET + CBC  Chronic Hyponatremia HTN Edema improved overall. Will go ahead and lower lasix dose due to hyponatremia Reduce from 40mg  daily down to 20mg  - can cut 40mg  tab in half. Changed instructions F/u based on lab results if improved sodium will send new order for 20mg  daily lasix instead.   No orders of the defined types were placed in this encounter.     Follow up plan: Return if symptoms worsen or fail to improve.   [name], [facility] 12/10/2020, 10:54 AM

## 2020-12-11 ENCOUNTER — Other Ambulatory Visit: Payer: Self-pay | Admitting: Family Medicine

## 2020-12-11 DIAGNOSIS — R600 Localized edema: Secondary | ICD-10-CM

## 2020-12-11 MED ORDER — FUROSEMIDE 20 MG PO TABS: 20.0000 mg | ORAL_TABLET | Freq: Every day | ORAL | 1 refills | Status: DC

## 2020-12-16 ENCOUNTER — Telehealth: Payer: Self-pay | Admitting: Family Medicine

## 2020-12-16 NOTE — Telephone Encounter (Signed)
Copied from [place] [id number]. Topic: Medicare AWV >> Dec 16, 2020  2:32 PM Cher Nakai R wrote: Reason for CRM:  Left message for patient to call back and schedule the Medicare Annual Wellness Visit (AWV) virtually or by telephone.  Last AWV 04/17/2015  Please schedule at anytime with [place].  40 minute appointment  Any questions, please call me at [phone]

## 2021-01-07 ENCOUNTER — Other Ambulatory Visit: Payer: Self-pay | Admitting: Family Medicine

## 2021-01-07 DIAGNOSIS — R600 Localized edema: Secondary | ICD-10-CM

## 2021-01-07 DIAGNOSIS — I10 Essential (primary) hypertension: Secondary | ICD-10-CM

## 2021-01-07 DIAGNOSIS — G609 Hereditary and idiopathic neuropathy, unspecified: Secondary | ICD-10-CM

## 2021-01-07 DIAGNOSIS — G250 Essential tremor: Secondary | ICD-10-CM

## 2021-01-07 DIAGNOSIS — K219 Gastro-esophageal reflux disease without esophagitis: Secondary | ICD-10-CM

## 2021-01-07 DIAGNOSIS — E782 Mixed hyperlipidemia: Secondary | ICD-10-CM

## 2021-01-08 ENCOUNTER — Encounter: Payer: Self-pay | Admitting: Family Medicine

## 2021-01-08 ENCOUNTER — Other Ambulatory Visit: Payer: Self-pay

## 2021-01-08 ENCOUNTER — Ambulatory Visit (INDEPENDENT_AMBULATORY_CARE_PROVIDER_SITE_OTHER): Payer: Medicare Other | Admitting: Family Medicine

## 2021-01-08 VITALS — BP 136/76 | HR 89 | Ht 72.0 in | Wt 207.0 lb

## 2021-01-08 DIAGNOSIS — M545 Low back pain, unspecified: Secondary | ICD-10-CM

## 2021-01-08 NOTE — Telephone Encounter (Signed)
Requested medications are due for refill today yes  Requested medications are on the active medication list Pravastatin written differently  Last refill 6/7  Last visit 11/2020  Future visit scheduled 01/2021  Notes to clinic Primidone, not delegated, Pravastatin has different signature, please assess.

## 2021-01-08 NOTE — Patient Instructions (Addendum)
Thank you for coming to the office today.  Recommend to start taking Tylenol Extra Strength '500mg'$  tabs - take 1 to 2 tabs per dose (max '1000mg'$ ) every 6-8 hours for pain (take regularly, don't skip a dose for next 3 days), max 24 hour daily dose is 6 tablets or '3000mg'$ . In the future you can repeat the same everyday Tylenol course for 1-2 weeks at a time.   Keep using topical heating pad / wrap.  May try anti inflammatory topical - OTC Voltaren (generic Diclofenac) topical 2-4 times a day as needed for pain swelling of affected joint for 1-2 weeks or longer.  Call back if interested in muscle relaxant. Or Tramadol for stronger pain relief if needed.   Lab results  Chemistry shows improved sodium at 124. Otherwise normal including kidney function.   Blood count shows Hemoglobin 11.3, stable to improved. White blood count normalrange  NA = Sodium K = Potassium CL = Chloride CO2 = Carbon Dioxide BUN = Kidney number, want it to be normal / low Creatinine = Kidney function Glucose = Sugar AST / ALT = Liver enzymes Those are most important  Comprehensive Metabolic Panel:    Component Value Date/Time   NA 124 (L) 12/10/2020 1113   NA 133 (A) 11/06/2018 0000   K 3.7 12/10/2020 1113   CL 87 (L) 12/10/2020 1113   CO2 30 12/10/2020 1113   BUN 19 12/10/2020 1113   BUN 12 11/06/2018 0000   CREATININE 0.77 12/10/2020 1113   GLUCOSE 93 12/10/2020 1113   CALCIUM 9.2 12/10/2020 1113   AST 21 11/26/2020 1315   ALT 17 11/26/2020 1315   ALKPHOS 59 11/26/2020 1315   BILITOT 0.6 11/26/2020 1315   BILITOT 0.4 09/29/2015 0845   PROT 7.5 11/26/2020 1315   PROT 6.6 09/29/2015 0845   ALBUMIN 4.4 11/26/2020 1315   ALBUMIN 4.4 09/29/2015 0845     Please schedule a Follow-up Appointment to: Return in about 2 weeks (around 01/22/2021), or if symptoms worsen or fail to improve, for 2 weeks back pain if not improved.  If you have any other questions or concerns, please feel free to call the office  or send a message through [place]. You may also schedule an earlier appointment if necessary.  Additionally, you may be receiving a survey about your experience at our office within a few days to 1 week by e-mail or mail. We value your feedback.  [name], DO [place]

## 2021-01-08 NOTE — Progress Notes (Signed)
Subjective:    Patient ID: [name], male    DOB: 1935/06/27, 85 y.o.   MRN: [id number]  [name] is a 85 y.o. male presenting on 01/08/2021 for Back Pain   HPI  Low Back Pain Fall Injury - Thursday 7/21 Accidental mechanical fall sitting in wheelchair at table, and he reached down and chair not locked and he fell reaching for something on ground. He had some mild pain at first, then he did okay. 1-2 days later he was feeling pretty good. He did some rotation exercises for back and then Sunday about 4 days ago he felt some worse acute low back pain bilateral. He applied heating pad with some relief. Now has residual symptoms R lower back with soreness. - Asking about Tylenol dosing today - Has Voltaren not used. - Takes Gabapentin No other locations of pain. - He had abrasion on Left hand that is scabbed and healing.  Uses walker, has chronic foot drop, neuropathy in lower extremity  Depression screen [facility] 2/9 12/19/2019 03/20/2019 02/05/2019  Decreased Interest 0 0 0  Down, Depressed, Hopeless 0 0 0  PHQ - 2 Score 0 0 0    Social History   Tobacco Use   Smoking status: Never   Smokeless tobacco: Never  Vaping Use   Vaping Use: Never used  Substance Use Topics   Alcohol use: Yes    Alcohol/week: 7.0 standard drinks    Types: 7 Glasses of wine per week    Comment: wine in the evening with dinner   Drug use: No    Review of Systems Per HPI unless specifically indicated above     Objective:    BP 136/76   Pulse 89   Ht 6' (1.829 m)   Wt 207 lb (93.9 kg)   SpO2 98%   BMI 28.07 kg/m   Wt Readings from Last 3 Encounters:  01/08/21 207 lb (93.9 kg)  12/10/20 207 lb 3.2 oz (94 kg)  11/26/20 194 lb (88 kg)    Physical Exam Vitals and nursing note reviewed.  Constitutional:      General: He is not in acute distress.    Appearance: He is well-developed. He is not diaphoretic.     Comments: Well-appearing, comfortable, cooperative  HENT:     Head: Normocephalic and  atraumatic.  Eyes:     General:        Right eye: No discharge.        Left eye: No discharge.     Conjunctiva/sclera: Conjunctivae normal.  Neck:     Thyroid: No thyromegaly.  Cardiovascular:     Rate and Rhythm: Normal rate and regular rhythm.     Pulses: Normal pulses.     Heart sounds: Normal heart sounds. No murmur heard. Pulmonary:     Effort: Pulmonary effort is normal. No respiratory distress.     Breath sounds: Normal breath sounds. No wheezing or rales.  Musculoskeletal:        General: Normal range of motion.     Cervical back: Normal range of motion and neck supple.     Comments: R low back SI region mild hypertonicity no point tenderness.  Foot drop with using walker  Lymphadenopathy:     Cervical: No cervical adenopathy.  Skin:    General: Skin is warm and dry.     Findings: No erythema or rash.  Neurological:     Mental Status: He is alert and oriented to person, place, and time. Mental  status is at baseline.  Psychiatric:        Behavior: Behavior normal.     Comments: Well groomed, good eye contact, normal speech and thoughts   Results for orders placed or performed in visit on 0000000  BASIC METABOLIC PANEL WITH GFR  Result Value Ref Range   Glucose, Bld 93 65 - 99 mg/dL   BUN 19 7 - 25 mg/dL   Creat 0.77 0.70 - 1.11 mg/dL   GFR, Est Non African American 84 > OR = 60 mL/min/1.18m   GFR, Est African American 97 > OR = 60 mL/min/1.740m  BUN/Creatinine Ratio NOT APPLICABLE 6 - 22 (calc)   Sodium 124 (L) 135 - 146 mmol/L   Potassium 3.7 3.5 - 5.3 mmol/L   Chloride 87 (L) 98 - 110 mmol/L   CO2 30 20 - 32 mmol/L   Calcium 9.2 8.6 - 10.3 mg/dL  CBC with Differential/Platelet  Result Value Ref Range   WBC 6.1 3.8 - 10.8 Thousand/uL   RBC 3.14 (L) 4.20 - 5.80 Million/uL   Hemoglobin 11.3 (L) 13.2 - 17.1 g/dL   HCT 33.1 (L) 38.5 - 50.0 %   MCV 105.4 (H) 80.0 - 100.0 fL   MCH 36.0 (H) 27.0 - 33.0 pg   MCHC 34.1 32.0 - 36.0 g/dL   RDW 12.4 11.0 - 15.0 %    Platelets 164 140 - 400 Thousand/uL   MPV 9.6 7.5 - 12.5 fL   Neutro Abs 3,733 1,500 - 7,800 cells/uL   Lymphs Abs 1,312 850 - 3,900 cells/uL   Absolute Monocytes 817 200 - 950 cells/uL   Eosinophils Absolute 220 15 - 500 cells/uL   Basophils Absolute 18 0 - 200 cells/uL   Neutrophils Relative % 61.2 %   Total Lymphocyte 21.5 %   Monocytes Relative 13.4 %   Eosinophils Relative 3.6 %   Basophils Relative 0.3 %      Assessment & Plan:   Problem List Items Addressed This Visit   None Visit Diagnoses     Acute right-sided low back pain without sciatica    -  Primary       Acute on chronic R LBP without sciatica. Suspect likely due to muscle spasm/strain, with stretching fall injury out of wheelchair, short distance, no high impact trauma Known OA DJD lumbar spine - No red flag symptoms. Negative SLR for radiculopathy - Inadequate conservative therapy   Reassurance today - improving.  Plan: 1. Increase Tylenol dosing to 500-'1000mg'$  TID regular dosing. - trial OTC Diclofenac voltaren topical PRN 2. Consider other options for medications such as muscle relaxants but he has failed methocarbamol baclofen in past - defer for now. 3. Encouraged use of heating pad 1-2x daily for now then PRN   Follow-up 4-6 weeks if not improved for re-evaluation, consider X-ray imaging, trial of PT, and possibly referral to Orthopedic   No orders of the defined types were placed in this encounter.     Follow up plan: Return in about 2 weeks (around 01/22/2021), or if symptoms worsen or fail to improve, for 2 weeks back pain if not improved.   AlNobie PutnamDOWilmerdingedical Group 01/08/2021, 9:06 AM

## 2021-01-11 ENCOUNTER — Other Ambulatory Visit: Payer: Self-pay | Admitting: Family Medicine

## 2021-01-11 DIAGNOSIS — I10 Essential (primary) hypertension: Secondary | ICD-10-CM

## 2021-01-12 ENCOUNTER — Ambulatory Visit (INDEPENDENT_AMBULATORY_CARE_PROVIDER_SITE_OTHER): Payer: Medicare Other

## 2021-01-12 VITALS — Ht 72.0 in | Wt 206.0 lb

## 2021-01-12 DIAGNOSIS — Z0000 Encounter for general adult medical examination without abnormal findings: Secondary | ICD-10-CM | POA: Diagnosis not present

## 2021-01-12 NOTE — Patient Instructions (Signed)
[name] , Thank you for taking time to come for your Medicare Wellness Visit. I appreciate your ongoing commitment to your health goals. Please review the following plan we discussed and let me know if I can assist you in the future.   Screening recommendations/referrals: Colonoscopy: not required Recommended yearly ophthalmology/optometry visit for glaucoma screening and checkup Recommended yearly dental visit for hygiene and checkup  Vaccinations: Influenza vaccine: due Pneumococcal vaccine: completed 04/10/2014 Tdap vaccine: completed 10/23/2019, due 10/22/2029 Shingles vaccine: completed per patient   Covid-19:  05/18/2020, 09/02/2019, 08/05/2019  Advanced directives: Please bring a copy of your POA (Power of Attorney) and/or Living Will to your next appointment.   Conditions/risks identified: none  Next appointment: Follow up in one year for your annual wellness visit.   Preventive Care 44 Years and Older, Male Preventive care refers to lifestyle choices and visits with your health care provider that can promote health and wellness. What does preventive care include? A yearly physical exam. This is also called an annual well check. Dental exams once or twice a year. Routine eye exams. Ask your health care provider how often you should have your eyes checked. Personal lifestyle choices, including: Daily care of your teeth and gums. Regular physical activity. Eating a healthy diet. Avoiding tobacco and drug use. Limiting alcohol use. Practicing safe sex. Taking low doses of aspirin every day. Taking vitamin and mineral supplements as recommended by your health care provider. What happens during an annual well check? The services and screenings done by your health care provider during your annual well check will depend on your age, overall health, lifestyle risk factors, and family history of disease. Counseling  Your health care provider may ask you questions about your: Alcohol  use. Tobacco use. Drug use. Emotional well-being. Home and relationship well-being. Sexual activity. Eating habits. History of falls. Memory and ability to understand (cognition). Work and work Statistician. Screening  You may have the following tests or measurements: Height, weight, and BMI. Blood pressure. Lipid and cholesterol levels. These may be checked every 5 years, or more frequently if you are over 8 years old. Skin check. Lung cancer screening. You may have this screening every year starting at age 35 if you have a 30-pack-year history of smoking and currently smoke or have quit within the past 15 years. Fecal occult blood test (FOBT) of the stool. You may have this test every year starting at age 63. Flexible sigmoidoscopy or colonoscopy. You may have a sigmoidoscopy every 5 years or a colonoscopy every 10 years starting at age 32. Prostate cancer screening. Recommendations will vary depending on your family history and other risks. Hepatitis C blood test. Hepatitis B blood test. Sexually transmitted disease (STD) testing. Diabetes screening. This is done by checking your blood sugar (glucose) after you have not eaten for a while (fasting). You may have this done every 1-3 years. Abdominal aortic aneurysm (AAA) screening. You may need this if you are a current or former smoker. Osteoporosis. You may be screened starting at age 65 if you are at high risk. Talk with your health care provider about your test results, treatment options, and if necessary, the need for more tests. Vaccines  Your health care provider may recommend certain vaccines, such as: Influenza vaccine. This is recommended every year. Tetanus, diphtheria, and acellular pertussis (Tdap, Td) vaccine. You may need a Td booster every 10 years. Zoster vaccine. You may need this after age 28. Pneumococcal 13-valent conjugate (PCV13) vaccine. One dose is  recommended after age 27. Pneumococcal polysaccharide  (PPSV23) vaccine. One dose is recommended after age 24. Talk to your health care provider about which screenings and vaccines you need and how often you need them. This information is not intended to replace advice given to you by your health care provider. Make sure you discuss any questions you have with your health care provider. Document Released: 06/26/2015 Document Revised: 02/17/2016 Document Reviewed: 03/31/2015 Elsevier Interactive Patient Education  2017 [employer] Prevention in the Home Falls can cause injuries. They can happen to people of all ages. There are many things you can do to make your home safe and to help prevent falls. What can I do on the outside of my home? Regularly fix the edges of walkways and driveways and fix any cracks. Remove anything that might make you trip as you walk through a door, such as a raised step or threshold. Trim any bushes or trees on the path to your home. Use bright outdoor lighting. Clear any walking paths of anything that might make someone trip, such as rocks or tools. Regularly check to see if handrails are loose or broken. Make sure that both sides of any steps have handrails. Any raised decks and porches should have guardrails on the edges. Have any leaves, snow, or ice cleared regularly. Use sand or salt on walking paths during winter. Clean up any spills in your garage right away. This includes oil or grease spills. What can I do in the bathroom? Use night lights. Install grab bars by the toilet and in the tub and shower. Do not use towel bars as grab bars. Use non-skid mats or decals in the tub or shower. If you need to sit down in the shower, use a plastic, non-slip stool. Keep the floor dry. Clean up any water that spills on the floor as soon as it happens. Remove soap buildup in the tub or shower regularly. Attach bath mats securely with double-sided non-slip rug tape. Do not have throw rugs and other things on the  floor that can make you trip. What can I do in the bedroom? Use night lights. Make sure that you have a light by your bed that is easy to reach. Do not use any sheets or blankets that are too big for your bed. They should not hang down onto the floor. Have a firm chair that has side arms. You can use this for support while you get dressed. Do not have throw rugs and other things on the floor that can make you trip. What can I do in the kitchen? Clean up any spills right away. Avoid walking on wet floors. Keep items that you use a lot in easy-to-reach places. If you need to reach something above you, use a strong step stool that has a grab bar. Keep electrical cords out of the way. Do not use floor polish or wax that makes floors slippery. If you must use wax, use non-skid floor wax. Do not have throw rugs and other things on the floor that can make you trip. What can I do with my stairs? Do not leave any items on the stairs. Make sure that there are handrails on both sides of the stairs and use them. Fix handrails that are broken or loose. Make sure that handrails are as long as the stairways. Check any carpeting to make sure that it is firmly attached to the stairs. Fix any carpet that is loose or worn. Avoid having  throw rugs at the top or bottom of the stairs. If you do have throw rugs, attach them to the floor with carpet tape. Make sure that you have a light switch at the top of the stairs and the bottom of the stairs. If you do not have them, ask someone to add them for you. What else can I do to help prevent falls? Wear shoes that: Do not have high heels. Have rubber bottoms. Are comfortable and fit you well. Are closed at the toe. Do not wear sandals. If you use a stepladder: Make sure that it is fully opened. Do not climb a closed stepladder. Make sure that both sides of the stepladder are locked into place. Ask someone to hold it for you, if possible. Clearly mark and make  sure that you can see: Any grab bars or handrails. First and last steps. Where the edge of each step is. Use tools that help you move around (mobility aids) if they are needed. These include: Canes. Walkers. Scooters. Crutches. Turn on the lights when you go into a dark area. Replace any light bulbs as soon as they burn out. Set up your furniture so you have a clear path. Avoid moving your furniture around. If any of your floors are uneven, fix them. If there are any pets around you, be aware of where they are. Review your medicines with your doctor. Some medicines can make you feel dizzy. This can increase your chance of falling. Ask your doctor what other things that you can do to help prevent falls. This information is not intended to replace advice given to you by your health care provider. Make sure you discuss any questions you have with your health care provider. Document Released: 03/26/2009 Document Revised: 11/05/2015 Document Reviewed: 07/04/2014 Elsevier Interactive Patient Education  2017 [employer].

## 2021-01-12 NOTE — Progress Notes (Signed)
I connected with [name] today by telephone and verified that I am speaking with the correct person using two identifiers. Location patient: home Location provider: work Persons participating in the virtual visit: [name], [name].   I discussed the limitations, risks, security and privacy concerns of performing an evaluation and management service by telephone and the availability of in person appointments. I also discussed with the patient that there may be a patient responsible charge related to this service. The patient expressed understanding and verbally consented to this telephonic visit.    Interactive audio and video telecommunications were attempted between this provider and patient, however failed, due to patient having technical difficulties OR patient did not have access to video capability.  We continued and completed visit with audio only.     Vital signs may be patient reported or missing.  Subjective:   [name] is a 85 y.o. male who presents for Medicare Annual/Subsequent preventive examination.  Review of Systems     Cardiac Risk Factors include: advanced age (>52mn, >>74women);hypertension;male gender;sedentary lifestyle     Objective:    Today's Vitals   01/12/21 1057  Weight: 206 lb (93.4 kg)  Height: 6' (1.829 m)  PainSc: 6    Body mass index is 27.94 kg/m.  Advanced Directives 01/12/2021 08/09/2020 06/13/2020 04/22/2020 04/22/2020 04/12/2020 03/01/2020  Does Patient Have a Medical Advance Directive? Yes Yes No Yes Yes No Yes  Type of AParamed[place]of [place] will [place] will - - [place] - Living will;Healthcare Power of Attorney  Does patient want to make changes to medical advance directive? - No - Guardian declined - No - Patient declined - - -  Copy of [place] Chart? No - copy requested No - copy requested - - - - -    Current  Medications (verified) Outpatient Encounter Medications as of 01/12/2021  Medication Sig   Alpha Lipoic Acid 200 MG CAPS Take 2 capsules by mouth daily.   amLODipine (NORVASC) 2.5 MG tablet TAKE 1 TABLET BY MOUTH  DAILY   Apoaequorin 10 MG CAPS Take 1 capsule by mouth as directed.   aspirin 81 MG tablet Take 162 mg by mouth at bedtime.   Azelastine HCl 137 MCG/SPRAY SOLN PLACE 1 SPRAY INTO BOTH NOSTRILS 2 (TWO) TIMES DAILY. USE IN EACH NOSTRIL AS DIRECTED   B Complex Vitamins (VITAMIN-B COMPLEX PO) Take 1 tablet by mouth daily.   Biotin 1000 MCG tablet Take 1,000 mcg by mouth daily.   Calcium Carb-Cholecalciferol 500-100 MG-UNIT CHEW Chew 500 mg by mouth daily.   cyanocobalamin 1000 MCG tablet Take 1,000 mcg by mouth daily.   diphenhydrAMINE (BENADRYL) 25 MG tablet Take 25 mg by mouth at bedtime as needed for allergies.   folic acid (FOLVITE) 1 MG tablet Take 1 mg by mouth daily.   furosemide (LASIX) 20 MG tablet TAKE 1 TABLET BY MOUTH  DAILY   gabapentin (NEURONTIN) 600 MG tablet TAKE 2 TABLETS BY MOUTH  TWICE DAILY   gentamicin cream (GARAMYCIN) 0.1 % APPLY TO AFFECTED AREA 1 TIMES DAILY   Glucosamine Sulfate 1000 MG CAPS Take 1 capsule by mouth daily.   Lecithin 400 MG CAPS Take 400 mg by mouth daily.   linezolid (ZYVOX) 600 MG tablet Take 1 tablet (600 mg total) by mouth 2 (two) times daily.   melatonin 5 MG TABS Take 5 mg by mouth at bedtime.   Omega 3  1000 MG CAPS Take 1,000 mg by mouth daily.   omeprazole (PRILOSEC) 20 MG capsule TAKE 1 CAPSULE BY MOUTH  TWICE DAILY BEFORE MEALS   potassium chloride SA (KLOR-CON) 20 MEQ tablet Take 1 tablet (20 mEq total) by mouth 2 (two) times daily.   pravastatin (PRAVACHOL) 80 MG tablet TAKE 1 TABLET BY MOUTH  DAILY   primidone (MYSOLINE) 50 MG tablet Take 1-2 tablets (50-100 mg total) by mouth See admin instructions. Take 2 tablets ('100mg'$ ) by mouth every morning and take 1 tablet ('50mg'$ ) by mouth every night   Selenium 200 MCG CAPS Take 200 mcg by  mouth daily.   tretinoin (RETIN-A) 0.05 % cream Apply 1 application topically at bedtime.   triamcinolone (KENALOG) 0.1 % Apply 1 application topically 2 (two) times daily. (apply to hands, legs and feet)   No facility-administered encounter medications on file as of 01/12/2021.    Allergies (verified) Codeine, Indocin [indomethacin], Latex, Mupirocin, Plavix [clopidogrel bisulfate], Polysporin [bacitracin-polymyxin b], Shellfish allergy, Tape, Neosporin [neomycin-bacitracin zn-polymyx], and Other   History: Past Medical History:  Diagnosis Date   Arthritis    Benign essential tremor    Fracture, femur ([place]) 2012   GERD (gastroesophageal reflux disease)    H/O echocardiogram    05/2013   Hx of colonoscopy    04/22/2013   Hypercholesterolemia    Hypertension    Myocardial infarction [facility])    1987   Polyneuropathy    Prostate cancer ([place]) 2004   Superficial hematoma    Past Surgical History:  Procedure Laterality Date   APPLICATION OF WOUND VAC Left 08/12/2020   Procedure: APPLICATION OF WOUND VAC;  Surgeon: Samara Deist, DPM;  Location: [facility] ORS;  Service: Podiatry;  Laterality: Left;   BASAL CELL CARCINOMA EXCISION     COLONOSCOPY  05/01/2009   Positive for colonic polyps   COLONOSCOPY WITH PROPOFOL N/A 02/24/2016   Procedure: COLONOSCOPY WITH PROPOFOL;  Surgeon: Christene Lye, MD;  Location: [facility] ENDOSCOPY;  Service: Endoscopy;  Laterality: N/A;   CORONARY ARTERY BYPASS GRAFT  1987   [place]   PROSTATE SURGERY  2004   TEE WITHOUT CARDIOVERSION N/A 08/13/2020   Procedure: TRANSESOPHAGEAL ECHOCARDIOGRAM (TEE);  Surgeon: Corey Skains, MD;  Location: [facility] ORS;  Service: Cardiovascular;  Laterality: N/A;   WOUND DEBRIDEMENT Left 08/12/2020   Procedure: DEBRIDEMENT WOUND-Left Heel;  Surgeon: Samara Deist, DPM;  Location: [facility] ORS;  Service: Podiatry;  Laterality: Left;   Family History  Problem Relation Age of Onset   Heart disease Mother     Congestive Heart Failure Father    Social History   Socioeconomic History   Marital status: Married    Spouse name: Not on file   Number of children: Not on file   Years of education: Not on file   Highest education level: Not on file  Occupational History   Not on file  Tobacco Use   Smoking status: Never   Smokeless tobacco: Never  Vaping Use   Vaping Use: Never used  Substance and Sexual Activity   Alcohol use: Yes    Alcohol/week: 7.0 standard drinks    Types: 7 Glasses of wine per week    Comment: wine in the evening with dinner   Drug use: No   Sexual activity: Not on file  Other Topics Concern   Not on file  Social History Narrative   Not on file   Social Determinants of Health   Financial Resource  Strain: Not on file  Food Insecurity: Not on file  Transportation Needs: Not on file  Physical Activity: Not on file  Stress: Not on file  Social Connections: Not on file    Tobacco Counseling Counseling given: Not Answered   Clinical Intake:  Pre-visit preparation completed: Yes  Pain : 0-10 Pain Score: 6  Pain Type: Acute pain Pain Location: Hip Pain Orientation: Right Pain Descriptors / Indicators: Aching Pain Onset: 1 to 4 weeks ago Pain Frequency: Constant     Nutritional Status: BMI 25 -29 Overweight Nutritional Risks: None Diabetes: No  How often do you need to have someone help you when you read instructions, pamphlets, or other written materials from your doctor or pharmacy?: 1 - Never What is the last grade level you completed in school?: masters degree  Diabetic? no  Interpreter Needed?: No  Information entered by :: NAllen Holt   Activities of Daily Living In your present state of health, do you have any difficulty performing the following activities: 01/12/2021 08/09/2020  Hearing? Tempie Donning  Comment hearing aides -  Vision? N N  Difficulty concentrating or making decisions? N N  Walking or climbing stairs? Y Y  Dressing or bathing? N  Y  Comment needs help with socks and shoes -  Doing errands, shopping? N N  Preparing Food and eating ? N -  Using the Toilet? N -  In the past six months, have you accidently leaked urine? N -  Do you have problems with loss of bowel control? N -  Managing your Medications? N -  Managing your Finances? N -  Housekeeping or managing your Housekeeping? N -  Some recent data might be hidden    Patient Care Team: [name], DO as PCP - General (Family Medicine) Christene Lye, MD (General Surgery)  Indicate any recent Medical Services you may have received from other than [facility] providers in the past year (date may be approximate).     Assessment:   This is a routine wellness examination for [name].  Hearing/Vision screen Vision Screening - Comments:: Regular eye exams, [place], Dr. Loma Boston  Dietary issues and exercise activities discussed: Current Exercise Habits: The patient does not participate in regular exercise at present   Goals Addressed             This Visit's Progress    Patient Stated       01/12/2021, wants to get back healed up       Depression Screen PHQ 2/9 Scores 01/12/2021 12/19/2019 03/20/2019 02/05/2019 09/05/2018 03/06/2018 12/15/2017  PHQ - 2 Score 0 0 0 0 0 0 0    Fall Risk Fall Risk  01/12/2021 01/08/2021 12/10/2020 06/24/2020 12/19/2019  Falls in the past year? '1 1 1 '$ 0 1  Comment slipped out of wheelchair - - - -  Number falls in past yr: 0 1 1 0 1  Injury with Fall? 0 1 0 0 1  Comment - Fell out of wheelchair bending over to pick up a piece of cucumber. The chair was not locked an rolled away when he reached for it. - - -  Risk for fall due to : Impaired mobility;Impaired balance/gait;Medication side effect - - - -  Follow up Falls evaluation completed;Education provided;Falls prevention discussed Falls evaluation completed Falls evaluation completed - Falls evaluation completed    FALL RISK PREVENTION PERTAINING TO THE HOME:  Any  stairs in or around the home? No  If so, are there any without  handrails?  N/a Home free of loose throw rugs in walkways, pet beds, electrical cords, etc? Yes  Adequate lighting in your home to reduce risk of falls? Yes   ASSISTIVE DEVICES UTILIZED TO PREVENT FALLS:  Life alert? No  Use of a cane, walker or w/c? Yes  Grab bars in the bathroom? Yes  Shower chair or bench in shower? Yes  Elevated toilet seat or a handicapped toilet? Yes   TIMED UP AND GO:  Was the test performed? No .      Cognitive Function: MMSE - Mini Mental State Exam 04/17/2015  Orientation to time 5  Orientation to Place 5  Registration 3  Attention/ Calculation 5  Recall 3  Language- name 2 objects 2  Language- repeat 1  Language- follow 3 step command 3  Language- read & follow direction 1  Write a sentence 1  Copy design 1  Total score 30        Immunizations Immunization History  Administered Date(s) Administered   DTaP 08/26/2010, 09/17/2015   Fluad Quad(high Dose 65+) 03/20/2019, 03/18/2020   Influenza, High Dose Seasonal PF 02/28/2017   Influenza,inj,Quad PF,6+ Mos 04/10/2014   Influenza-Unspecified 03/10/2016, 02/26/2018   Moderna SARS-COV2 Booster Vaccination 05/18/2020   Moderna Sars-Covid-2 Vaccination 08/05/2019, 09/02/2019   Pneumococcal Conjugate-13 04/10/2014   Pneumococcal Polysaccharide-23 04/15/2009   Tdap 09/17/2015, 10/23/2019   Zoster, Live 10/07/2015    TDAP status: Up to date  Flu Vaccine status: Up to date  Pneumococcal vaccine status: Up to date  Covid-19 vaccine status: Completed vaccines  Qualifies for Shingles Vaccine? Yes   Zostavax completed Yes   Shingrix Completed?: Yes  Screening Tests Health Maintenance  Topic Date Due   Zoster Vaccines- Shingrix (1 of 2) Never done   URINE MICROALBUMIN  11/06/2019   COVID-19 Vaccine (4 - Booster for Moderna series) 08/16/2020   INFLUENZA VACCINE  01/11/2021   TETANUS/TDAP  10/22/2029   PNA vac Low Risk  Adult  Completed   HPV VACCINES  Aged Out    Health Maintenance  Health Maintenance Due  Topic Date Due   Zoster Vaccines- Shingrix (1 of 2) Never done   URINE MICROALBUMIN  11/06/2019   COVID-19 Vaccine (4 - Booster for Moderna series) 08/16/2020   INFLUENZA VACCINE  01/11/2021    Colorectal cancer screening: No longer required.   Lung Cancer Screening: (Low Dose CT Chest recommended if Age 24-80 years, 30 pack-year currently smoking OR have quit w/in 15years.) does not qualify.   Lung Cancer Screening Referral: no  Additional Screening:  Hepatitis C Screening: does not qualify;   Vision Screening: Recommended annual ophthalmology exams for early detection of glaucoma and other disorders of the eye. Is the patient up to date with their annual eye exam?  Yes  Who is the provider or what is the name of the office in which the patient attends annual eye exams? Dr. Loma Boston If pt is not established with a provider, would they like to be referred to a provider to establish care? No .   Dental Screening: Recommended annual dental exams for proper oral hygiene  Community Resource Referral / Chronic Care Management: CRR required this visit?  No   CCM required this visit?  No      Plan:     I have personally reviewed and noted the following in the patient's chart:   Medical and social history Use of alcohol, tobacco or illicit drugs  Current medications and supplements including opioid prescriptions. Patient is  not currently taking opioid prescriptions. Functional ability and status Nutritional status Physical activity Advanced directives List of other physicians Hospitalizations, surgeries, and ER visits in previous 12 months Vitals Screenings to include cognitive, depression, and falls Referrals and appointments  In addition, I have reviewed and discussed with patient certain preventive protocols, quality metrics, and best practice recommendations. A written  personalized care plan for preventive services as well as general preventive health recommendations were provided to patient.     Kellie Simmering, Holt   [id number]   Nurse Notes: 6 CIT not administered. Patient appears cognitive per direct conversation.

## 2021-01-19 ENCOUNTER — Ambulatory Visit (INDEPENDENT_AMBULATORY_CARE_PROVIDER_SITE_OTHER): Payer: Medicare Other | Admitting: Family Medicine

## 2021-01-19 ENCOUNTER — Other Ambulatory Visit: Payer: Self-pay

## 2021-01-19 ENCOUNTER — Telehealth: Payer: Self-pay | Admitting: Family Medicine

## 2021-01-19 ENCOUNTER — Encounter: Payer: Self-pay | Admitting: Family Medicine

## 2021-01-19 VITALS — BP 123/66 | HR 79 | Ht 72.0 in | Wt 206.0 lb

## 2021-01-19 DIAGNOSIS — M545 Low back pain, unspecified: Secondary | ICD-10-CM

## 2021-01-19 MED ORDER — PREDNISONE 20 MG PO TABS: ORAL_TABLET | ORAL | 0 refills | Status: DC

## 2021-01-19 NOTE — Patient Instructions (Addendum)
Thank you for coming to the office today.  Prednisone taper  Take daily with food. Start with '60mg'$  (3 pills) x 2 days, then reduce to '40mg'$ (2 pills) x 2 days, then '20mg'$  (1 pill) x 3 days   Ankle foot looks good to me, will send copy of pictures to Dr Delaine Lame for review.  Please schedule a Follow-up Appointment to: Return if symptoms worsen or fail to improve.  If you have any other questions or concerns, please feel free to call the office or send a message through [place]. You may also schedule an earlier appointment if necessary.  Additionally, you may be receiving a survey about your experience at our office within a few days to 1 week by e-mail or mail. We value your feedback.  [name], DO [place]

## 2021-01-19 NOTE — Progress Notes (Addendum)
Subjective:    Patient ID: [name], male    DOB: 11/16/1935, 85 y.o.   MRN: [id number]  [name] is a 85 y.o. male presenting on 01/19/2021 for Back Pain   HPI  Acute on Chronic L Hip Pain  Last visit 7/29 with me for same or similar issue with back pain  He was using Voltaren topical. Heating pad and rest Still has pain  Fall Injury - Thursday 7/21 Accidental mechanical fall sitting in wheelchair at table, and he reached down and chair not locked and he fell reaching for something on ground. He had some mild pain at first, then he did okay. 1-2 days later he was feeling pretty good. He did some rotation exercises for back and then Sunday about 4 days ago he felt some worse acute low back pain bilateral. He applied heating pad with some relief. Now has residual symptoms R lower back with soreness.  Takes Gabapentin No other locations of pain.   Uses walker, has chronic foot drop, neuropathy in lower extremity  He reports may have Worse Left lower extremity redness and warmth as well, but no fever or other symptoms. He wants [place] to take a look at it, no longer seeing Infection Disease. Prior MRSA infection   Depression screen [facility] 2/9 01/12/2021 12/19/2019 03/20/2019  Decreased Interest 0 0 0  Down, Depressed, Hopeless 0 0 0  PHQ - 2 Score 0 0 0    Social History   Tobacco Use   Smoking status: Never   Smokeless tobacco: Never  Vaping Use   Vaping Use: Never used  Substance Use Topics   Alcohol use: Yes    Alcohol/week: 7.0 standard drinks    Types: 7 Glasses of wine per week    Comment: wine in the evening with dinner   Drug use: No    Review of Systems Per HPI unless specifically indicated above     Objective:    BP 123/66   Pulse 79   Ht 6' (1.829 m)   Wt 206 lb (93.4 kg)   SpO2 97%   BMI 27.94 kg/m   Wt Readings from Last 3 Encounters:  01/19/21 206 lb (93.4 kg)  01/12/21 206 lb (93.4 kg)  01/08/21 207 lb (93.9 kg)    Physical Exam Vitals and nursing  note reviewed.  Constitutional:      General: He is not in acute distress.    Appearance: He is well-developed. He is not diaphoretic.     Comments: Well-appearing, comfortable, cooperative  HENT:     Head: Normocephalic and atraumatic.  Eyes:     General:        Right eye: No discharge.        Left eye: No discharge.     Conjunctiva/sclera: Conjunctivae normal.  Neck:     Thyroid: No thyromegaly.  Cardiovascular:     Rate and Rhythm: Normal rate and regular rhythm.     Pulses: Normal pulses.     Heart sounds: Normal heart sounds. No murmur heard. Pulmonary:     Effort: Pulmonary effort is normal. No respiratory distress.     Breath sounds: Normal breath sounds. No wheezing or rales.  Musculoskeletal:     Cervical back: Normal range of motion and neck supple.     Comments: Localized R low back pain over SI region and into gluteal, no bony point tenderness. Has range of motion, similar to baseline.  Able to ambulate and weight bear as per usual.  Has foot drop and uses walker.  Lymphadenopathy:     Cervical: No cervical adenopathy.  Skin:    General: Skin is warm and dry.     Findings: No erythema or rash.  Neurological:     Mental Status: He is alert and oriented to person, place, and time. Mental status is at baseline.  Psychiatric:        Behavior: Behavior normal.     Comments: Well groomed, good eye contact, normal speech and thoughts   Series of photos Left lower extremity             Results for orders placed or performed in visit on 0000000  BASIC METABOLIC PANEL WITH GFR  Result Value Ref Range   Glucose, Bld 93 65 - 99 mg/dL   BUN 19 7 - 25 mg/dL   Creat 0.77 0.70 - 1.11 mg/dL   GFR, Est Non African American 84 > OR = 60 mL/min/1.70m   GFR, Est African American 97 > OR = 60 mL/min/1.747m  BUN/Creatinine Ratio NOT APPLICABLE 6 - 22 (calc)   Sodium 124 (L) 135 - 146 mmol/L   Potassium 3.7 3.5 - 5.3 mmol/L   Chloride 87 (L) 98 - 110 mmol/L   CO2  30 20 - 32 mmol/L   Calcium 9.2 8.6 - 10.3 mg/dL  CBC with Differential/Platelet  Result Value Ref Range   WBC 6.1 3.8 - 10.8 Thousand/uL   RBC 3.14 (L) 4.20 - 5.80 Million/uL   Hemoglobin 11.3 (L) 13.2 - 17.1 g/dL   HCT 33.1 (L) 38.5 - 50.0 %   MCV 105.4 (H) 80.0 - 100.0 fL   MCH 36.0 (H) 27.0 - 33.0 pg   MCHC 34.1 32.0 - 36.0 g/dL   RDW 12.4 11.0 - 15.0 %   Platelets 164 140 - 400 Thousand/uL   MPV 9.6 7.5 - 12.5 fL   Neutro Abs 3,733 1,500 - 7,800 cells/uL   Lymphs Abs 1,312 850 - 3,900 cells/uL   Absolute Monocytes 817 200 - 950 cells/uL   Eosinophils Absolute 220 15 - 500 cells/uL   Basophils Absolute 18 0 - 200 cells/uL   Neutrophils Relative % 61.2 %   Total Lymphocyte 21.5 %   Monocytes Relative 13.4 %   Eosinophils Relative 3.6 %   Basophils Relative 0.3 %      Assessment & Plan:   Problem List Items Addressed This Visit   None Visit Diagnoses     Acute left-sided low back pain without sciatica    -  Primary   Relevant Medications   predniSONE (DELTASONE) 20 MG tablet       Acute on chronic R LBP without sciatica. Suspect likely due to muscle spasm/strain, with stretching fall injury out of wheelchair, short distance, no high impact trauma Known OA DJD lumbar spine - No red flag symptoms. Negative SLR for radiculopathy - Inadequate conservative therapy     Plan:  Returned quickly to office today, will offer Prednisone steroid taper dosing for acute pain. Prednisone dosing reviewed sent to pharmacy, he did well with this in past back in 2019 for same issue back pain.  Continue Tylenol, Tpical voltaren, heating pad  Consider other options for medications such as muscle relaxants but he has failed methocarbamol baclofen in past - defer for now.    Follow-up 4-6 weeks if not improved for re-evaluation, consider X-ray imaging, trial of PT, and possibly referral to Orthopedic    Left lower extremity does NOT appear to  be new cellulitis infection. I am  reassured, will forward chart to Dr Delaine Lame for review as well.  Update addendum 01/20/21 1207pm Heard back from Dr Delaine Lame from chart review of pictures sent yesterday. She describes that it appears a little red but not appearing as if infection. Agrees with compression sock and elevation while sitting. Called patient to review this update, he is doing well and appreciates the call.  Meds ordered this encounter  Medications   predniSONE (DELTASONE) 20 MG tablet    Sig: Take daily with food. Start with '60mg'$  (3 pills) x 2 days, then reduce to '40mg'$  (2 pills) x 2 days, then '20mg'$  (1 pill) x 3 days    Dispense:  13 tablet    Refill:  0     Follow up plan: Return if symptoms worsen or fail to improve.   [name], [facility] 01/19/2021, 2:25 PM

## 2021-01-19 NOTE — Telephone Encounter (Signed)
[name], from [facility], called and is requesting to move a visit from 01/15/21 for discharge, but pt was unable to be reached. She is requesting to move order to this week. Please advise.      [phone]

## 2021-01-28 ENCOUNTER — Other Ambulatory Visit: Payer: Self-pay

## 2021-01-28 ENCOUNTER — Ambulatory Visit (INDEPENDENT_AMBULATORY_CARE_PROVIDER_SITE_OTHER): Payer: Medicare Other | Admitting: Family Medicine

## 2021-01-28 ENCOUNTER — Encounter: Payer: Self-pay | Admitting: Family Medicine

## 2021-01-28 VITALS — BP 134/76 | HR 73 | Ht 72.0 in | Wt 206.0 lb

## 2021-01-28 DIAGNOSIS — Z8614 Personal history of Methicillin resistant Staphylococcus aureus infection: Secondary | ICD-10-CM

## 2021-01-28 DIAGNOSIS — R110 Nausea: Secondary | ICD-10-CM

## 2021-01-28 DIAGNOSIS — M7061 Trochanteric bursitis, right hip: Secondary | ICD-10-CM

## 2021-01-28 DIAGNOSIS — L02212 Cutaneous abscess of back [any part, except buttock]: Secondary | ICD-10-CM

## 2021-01-28 DIAGNOSIS — L02429 Furuncle of limb, unspecified: Secondary | ICD-10-CM | POA: Diagnosis not present

## 2021-01-28 MED ORDER — LINEZOLID 600 MG PO TABS: 600.0000 mg | ORAL_TABLET | Freq: Two times a day (BID) | ORAL | 0 refills | Status: DC

## 2021-01-28 MED ORDER — HYDROCODONE-ACETAMINOPHEN 5-325 MG PO TABS: 1.0000 | ORAL_TABLET | Freq: Four times a day (QID) | ORAL | 0 refills | Status: DC | PRN

## 2021-01-28 MED ORDER — RIFAMPIN 300 MG PO CAPS: 300.0000 mg | ORAL_CAPSULE | Freq: Two times a day (BID) | ORAL | 0 refills | Status: DC

## 2021-01-28 MED ORDER — PROMETHAZINE HCL 12.5 MG PO TABS: 12.5000 mg | ORAL_TABLET | Freq: Three times a day (TID) | ORAL | 0 refills | Status: AC | PRN

## 2021-01-28 NOTE — Addendum Note (Signed)
Addended by: [name] on: 01/28/2021 11:02 AM   Modules accepted: Orders

## 2021-01-28 NOTE — Patient Instructions (Addendum)
I spoke with Dr Delaine Lame she can see you next week  Start antibiotics for possible MRSA abscess   Linezolid '600mg'$  twice a day Rifampin '300mg'$  twice a day  For 7 days  I have ordered hydrocodone pain medicine as needed for hip and back pain.  We will work on getting you seen ASAP for drainage of abscess.  Please schedule a Follow-up Appointment to: Return if symptoms worsen or fail to improve.  If you have any other questions or concerns, please feel free to call the office or send a message through [facility]. You may also schedule an earlier appointment if necessary.  Additionally, you may be receiving a survey about your experience at our office within a few days to 1 week by e-mail or mail. We value your feedback.  [name], DO [place]

## 2021-01-28 NOTE — Progress Notes (Addendum)
Subjective:    Patient ID: [name], male    DOB: [birth year], 85 y.o.   MRN: [id number]  [name] is a 85 y.o. male presenting on 01/28/2021 for Hip Pain and Back Pain   HPI  Acute on Chronic L Hip Pain  Last visit 7/29 and 8/9 with me for same or similar issue with back pain   He was using Voltaren topical. Heating pad and rest Still has pain in R Hip seems worse  Prior history Fall Injury - Thursday 7/21 Accidental mechanical fall sitting in wheelchair at table, and he reached down and chair not locked and he fell reaching for something on ground. He had some mild pain at first, then he did okay. 1-2 days later he was feeling pretty good. He did some rotation exercises for back and then Sunday about 4 days ago he felt some worse acute low back pain bilateral. He applied heating pad with some relief. Now has residual symptoms R lower back with soreness.  Takes Gabapentin Uses walker, has chronic foot drop, neuropathy in lower extremity  Today reports worse pain in R Hip over bursa  He has new problem with enlarged abscess on low back without drainage but warmth redness and pain. Also similar smaller boil on left anterior leg.  These have developed in past 3-4 days worsening.   History of MRSA cellulitis Left ankle treated by Dr Delaine Lame ID recently.   Depression screen [facility] 2/9 01/12/2021 12/19/2019 03/20/2019  Decreased Interest 0 0 0  Down, Depressed, Hopeless 0 0 0  PHQ - 2 Score 0 0 0    Social History   Tobacco Use   Smoking status: Never   Smokeless tobacco: Never  Vaping Use   Vaping Use: Never used  Substance Use Topics   Alcohol use: Yes    Alcohol/week: 7.0 standard drinks    Types: 7 Glasses of wine per week    Comment: wine in the evening with dinner   Drug use: No    Review of Systems Per HPI unless specifically indicated above     Objective:    BP 134/76   Pulse 73   Ht 6' (1.829 m)   Wt 206 lb (93.4 kg)   SpO2 96%   BMI 27.94 kg/m   Wt  Readings from Last 3 Encounters:  01/28/21 206 lb (93.4 kg)  01/19/21 206 lb (93.4 kg)  01/12/21 206 lb (93.4 kg)    Physical Exam Vitals and nursing note reviewed.  Constitutional:      General: He is not in acute distress.    Appearance: Normal appearance. He is well-developed. He is not diaphoretic.     Comments: Well-appearing, comfortable, cooperative  HENT:     Head: Normocephalic and atraumatic.  Eyes:     General:        Right eye: No discharge.        Left eye: No discharge.     Conjunctiva/sclera: Conjunctivae normal.  Cardiovascular:     Rate and Rhythm: Normal rate.  Pulmonary:     Effort: Pulmonary effort is normal.  Musculoskeletal:     Comments: Localized R Hip pain over trochanteric bursa  Some back pain over SI region and into gluteal, no bony point tenderness. Has range of motion, similar to baseline.  Able to ambulate and weight bear as per usual. Has foot drop and uses walker.    Skin:    General: Skin is warm and dry.  Findings: Erythema (large induration abscess appearing midline low back 7-8 cm long central ulceration without active drainage, see picture. also Left anterior leg above knee) present. No rash.  Neurological:     Mental Status: He is alert and oriented to person, place, and time.  Psychiatric:        Mood and Affect: Mood normal.        Behavior: Behavior normal.        Thought Content: Thought content normal.     Comments: Well groomed, good eye contact, normal speech and thoughts     Low Back    Left anterior leg    Results for orders placed or performed in visit on 0000000  BASIC METABOLIC PANEL WITH GFR  Result Value Ref Range   Glucose, Bld 93 65 - 99 mg/dL   BUN 19 7 - 25 mg/dL   Creat 0.77 0.70 - 1.11 mg/dL   GFR, Est Non African American 84 > OR = 60 mL/min/1.40m   GFR, Est African American 97 > OR = 60 mL/min/1.763m  BUN/Creatinine Ratio NOT APPLICABLE 6 - 22 (calc)   Sodium 124 (L) 135 - 146 mmol/L    Potassium 3.7 3.5 - 5.3 mmol/L   Chloride 87 (L) 98 - 110 mmol/L   CO2 30 20 - 32 mmol/L   Calcium 9.2 8.6 - 10.3 mg/dL  CBC with Differential/Platelet  Result Value Ref Range   WBC 6.1 3.8 - 10.8 Thousand/uL   RBC 3.14 (L) 4.20 - 5.80 Million/uL   Hemoglobin 11.3 (L) 13.2 - 17.1 g/dL   HCT 33.1 (L) 38.5 - 50.0 %   MCV 105.4 (H) 80.0 - 100.0 fL   MCH 36.0 (H) 27.0 - 33.0 pg   MCHC 34.1 32.0 - 36.0 g/dL   RDW 12.4 11.0 - 15.0 %   Platelets 164 140 - 400 Thousand/uL   MPV 9.6 7.5 - 12.5 fL   Neutro Abs 3,733 1,500 - 7,800 cells/uL   Lymphs Abs 1,312 850 - 3,900 cells/uL   Absolute Monocytes 817 200 - 950 cells/uL   Eosinophils Absolute 220 15 - 500 cells/uL   Basophils Absolute 18 0 - 200 cells/uL   Neutrophils Relative % 61.2 %   Total Lymphocyte 21.5 %   Monocytes Relative 13.4 %   Eosinophils Relative 3.6 %   Basophils Relative 0.3 %      Assessment & Plan:   Problem List Items Addressed This Visit   None Visit Diagnoses     Abscess of lower back    -  Primary   Relevant Medications   rifampin (RIFADIN) 300 MG capsule   linezolid (ZYVOX) 600 MG tablet   HYDROcodone-acetaminophen (NORCO/VICODIN) 5-325 MG tablet   Boil, leg       Relevant Medications   linezolid (ZYVOX) 600 MG tablet   History of MRSA infection       Relevant Medications   rifampin (RIFADIN) 300 MG capsule   linezolid (ZYVOX) 600 MG tablet   Trochanteric bursitis of right hip       Relevant Medications   HYDROcodone-acetaminophen (NORCO/VICODIN) 5-325 MG tablet       Hx MRSA Multiple abscess boil Largest low back midline Smaller Left anterior leg  Contacted His ID Dr RaDelaine Lamen [place] reviewed case. She should be able to follow-up with patient within 1 week Recommends I&D of abscess if suspected MRSA  Concern with size and multiple locations would defer I&D to other provider today given his  history, will consult General Surgery for this procedure. [facility] has been  contacted and they can work him in today at [employer] antibiotic regimen per advice of ID Dr Delaine Lame Linezolid '600mg'$  PO BID Rifampin added '300mg'$  PO BID Will order for 7 days  For pain, have ordered Hydrocodone 5/325 PRN - caution sedation, has history of nausea, add Phenergan PRN 12.5 to '25mg'$  nausea if need as well.  For R Hip Trochanteric Bursa, will defer cortisone injection given MRSA colonization risk.  We can reconsider injection in 1-2 weeks once infection is treated.  May need Orthopedics in future for more management of Hip if unresolved after treating infection.  Meds ordered this encounter  Medications   rifampin (RIFADIN) 300 MG capsule    Sig: Take 1 capsule (300 mg total) by mouth 2 (two) times daily.    Dispense:  14 capsule    Refill:  0   linezolid (ZYVOX) 600 MG tablet    Sig: Take 1 tablet (600 mg total) by mouth 2 (two) times daily.    Dispense:  14 tablet    Refill:  0    Approved by Dr Delaine Lame 01/28/21   HYDROcodone-acetaminophen (NORCO/VICODIN) 5-325 MG tablet    Sig: Take 1 tablet by mouth every 6 (six) hours as needed for moderate pain.    Dispense:  30 tablet    Refill:  0     Follow up plan: Return if symptoms worsen or fail to improve.   [name], [facility] 01/28/2021, 9:55 AM

## 2021-02-02 ENCOUNTER — Inpatient Hospital Stay: Payer: Medicare Other | Admitting: Infectious Diseases

## 2021-02-03 ENCOUNTER — Ambulatory Visit (INDEPENDENT_AMBULATORY_CARE_PROVIDER_SITE_OTHER): Payer: Medicare Other | Admitting: Internal Medicine

## 2021-02-03 ENCOUNTER — Other Ambulatory Visit: Payer: Self-pay

## 2021-02-03 ENCOUNTER — Encounter: Payer: Self-pay | Admitting: Internal Medicine

## 2021-02-03 ENCOUNTER — Ambulatory Visit
Admission: RE | Admit: 2021-02-03 | Discharge: 2021-02-03 | Disposition: A | Discharge status: Home / Self Care | Payer: Medicare Other | Source: Ambulatory Visit | Attending: Internal Medicine | Admitting: Internal Medicine

## 2021-02-03 ENCOUNTER — Ambulatory Visit
Admission: RE | Admit: 2021-02-03 | Discharge: 2021-02-03 | Disposition: A | Discharge status: Home / Self Care | Payer: Medicare Other | Attending: Internal Medicine | Admitting: Internal Medicine

## 2021-02-03 VITALS — BP 97/55 | HR 75 | Temp 97.3°F | Resp 17 | Ht 72.0 in

## 2021-02-03 DIAGNOSIS — M25551 Pain in right hip: Secondary | ICD-10-CM

## 2021-02-03 DIAGNOSIS — W19XXXA Unspecified fall, initial encounter: Secondary | ICD-10-CM

## 2021-02-03 DIAGNOSIS — Y92009 Unspecified place in unspecified non-institutional (private) residence as the place of occurrence of the external cause: Secondary | ICD-10-CM | POA: Diagnosis not present

## 2021-02-03 NOTE — Progress Notes (Signed)
Subjective:    Patient ID: [name], male    DOB: Jul 03, 1935, 85 y.o.   MRN: [id number]  HPI  Patient presents to clinic today with complaint of right hip pain.  He reports this started in July after a fall.  He reports he slid out of the chair.  He describes the pain as achy with sitting but also sharp and stabbing.  The pain radiates into his right thigh.  The pain is worse with transferring and ambulation.  He denies low back pain.  He reports chronic numbness, tingling and weakness in his lower extremities which he does not feel like is worse.  He reports he saw his PCP for the same and was prescribed hydrocodone but he is unable to take this because it makes him nauseated.  He would like an x-ray of his right hip today.  Review of Systems  Past Medical History:  Diagnosis Date   Arthritis    Benign essential tremor    Fracture, femur ([place]) 2012   GERD (gastroesophageal reflux disease)    H/O echocardiogram    05/2013   Hx of colonoscopy    04/22/2013   Hypercholesterolemia    Hypertension    Myocardial infarction [facility])    1987   Polyneuropathy    Prostate cancer ([place]) 2004   Superficial hematoma     Current Outpatient Medications  Medication Sig Dispense Refill   Alpha Lipoic Acid 200 MG CAPS Take 2 capsules by mouth daily.     amLODipine (NORVASC) 2.5 MG tablet TAKE 1 TABLET BY MOUTH  DAILY 90 tablet 1   Apoaequorin 10 MG CAPS Take 1 capsule by mouth as directed.     aspirin 81 MG tablet Take 162 mg by mouth at bedtime.     Azelastine HCl 137 MCG/SPRAY SOLN PLACE 1 SPRAY INTO BOTH NOSTRILS 2 (TWO) TIMES DAILY. USE IN EACH NOSTRIL AS DIRECTED 30 mL 5   B Complex Vitamins (VITAMIN-B COMPLEX PO) Take 1 tablet by mouth daily.     Biotin 1000 MCG tablet Take 1,000 mcg by mouth daily.     Calcium Carb-Cholecalciferol 500-100 MG-UNIT CHEW Chew 500 mg by mouth daily.     cyanocobalamin 1000 MCG tablet Take 1,000 mcg by mouth daily.     diphenhydrAMINE (BENADRYL) 25 MG tablet  Take 25 mg by mouth at bedtime as needed for allergies.     folic acid (FOLVITE) 1 MG tablet Take 1 mg by mouth daily.     furosemide (LASIX) 20 MG tablet TAKE 1 TABLET BY MOUTH  DAILY 90 tablet 1   gabapentin (NEURONTIN) 600 MG tablet TAKE 2 TABLETS BY MOUTH  TWICE DAILY 360 tablet 3   gentamicin cream (GARAMYCIN) 0.1 % APPLY TO AFFECTED AREA 1 TIMES DAILY     Glucosamine Sulfate 1000 MG CAPS Take 1 capsule by mouth daily.     HYDROcodone-acetaminophen (NORCO/VICODIN) 5-325 MG tablet Take 1 tablet by mouth every 6 (six) hours as needed for moderate pain. 30 tablet 0   Lecithin 400 MG CAPS Take 400 mg by mouth daily.     linezolid (ZYVOX) 600 MG tablet Take 1 tablet (600 mg total) by mouth 2 (two) times daily. 14 tablet 0   melatonin 5 MG TABS Take 5 mg by mouth at bedtime.     Omega 3 1000 MG CAPS Take 1,000 mg by mouth daily.     omeprazole (PRILOSEC) 20 MG capsule TAKE 1 CAPSULE BY MOUTH  TWICE DAILY BEFORE MEALS  180 capsule 3   potassium chloride SA (KLOR-CON) 20 MEQ tablet Take 1 tablet (20 mEq total) by mouth 2 (two) times daily. 180 tablet 3   pravastatin (PRAVACHOL) 80 MG tablet TAKE 1 TABLET BY MOUTH  DAILY 90 tablet 3   primidone (MYSOLINE) 50 MG tablet Take 1-2 tablets (50-100 mg total) by mouth See admin instructions. Take 2 tablets ('100mg'$ ) by mouth every morning and take 1 tablet ('50mg'$ ) by mouth every night 270 tablet 3   promethazine (PHENERGAN) 12.5 MG tablet Take 1-2 tablets (12.5-25 mg total) by mouth every 8 (eight) hours as needed for nausea or vomiting. Caution sedation. 30 tablet 0   rifampin (RIFADIN) 300 MG capsule Take 1 capsule (300 mg total) by mouth 2 (two) times daily. 14 capsule 0   Selenium 200 MCG CAPS Take 200 mcg by mouth daily.     tretinoin (RETIN-A) 0.05 % cream Apply 1 application topically at bedtime.     triamcinolone (KENALOG) 0.1 % Apply 1 application topically 2 (two) times daily. (apply to hands, legs and feet)     No current facility-administered  medications for this visit.    Allergies  Allergen Reactions   Codeine Nausea And Vomiting   Indocin [Indomethacin] Other (See Comments)   Latex Other (See Comments)    Blisters   Mupirocin     Blisters   Plavix [Clopidogrel Bisulfate]     GI intolerance, n/v   Polysporin [Bacitracin-Polymyxin B]     Blisters   Shellfish Allergy    Tape    Neosporin [Neomycin-Bacitracin Zn-Polymyx] Rash   Other Nausea And Vomiting    mussels    Family History  Problem Relation Age of Onset   Heart disease Mother    Congestive Heart Failure Father     Social History   Socioeconomic History   Marital status: Married    Spouse name: Not on file   Number of children: Not on file   Years of education: Not on file   Highest education level: Not on file  Occupational History   Not on file  Tobacco Use   Smoking status: Never   Smokeless tobacco: Never  Vaping Use   Vaping Use: Never used  Substance and Sexual Activity   Alcohol use: Yes    Alcohol/week: 7.0 standard drinks    Types: 7 Glasses of wine per week    Comment: wine in the evening with dinner   Drug use: No   Sexual activity: Not on file  Other Topics Concern   Not on file  Social History Narrative   Not on file   Social Determinants of Health   Financial Resource Strain: Not on file  Food Insecurity: Not on file  Transportation Needs: Not on file  Physical Activity: Not on file  Stress: Not on file  Social Connections: Not on file  Intimate Partner Violence: Not on file     Constitutional: Denies fever, malaise, fatigue, headache or abrupt weight changes.  Respiratory: Denies difficulty breathing, shortness of breath, cough or sputum production.   Cardiovascular: Denies chest pain, chest tightness, palpitations or swelling in the hands or feet.  Musculoskeletal: Patient reports right hip pain, difficulty with gait.  Denies muscle pain or joint swelling.  Skin: Denies bruising or abrasion.  Neurological:  Patient reports difficulty with balance.     No other specific complaints in a complete review of systems (except as listed in HPI above).     Objective:   Physical Exam  BP (!) 97/55 (BP Location: Right Arm, Patient Position: Sitting, Cuff Size: Normal)   Pulse 75   Temp (!) 97.3 F (36.3 C) (Temporal)   Resp 17   Ht 6' (1.829 m)   SpO2 98%   BMI 27.94 kg/m    Wt Readings from Last 3 Encounters:  01/28/21 206 lb (93.4 kg)  01/19/21 206 lb (93.4 kg)  01/12/21 206 lb (93.4 kg)    General: Appears his stated age, chronically ill-appearing, in NAD. Skin: Warm, dry and intact.  Cardiovascular: Normal rate and rhythm. S1,S2 noted.  No murmur, rubs or gallops noted.  Pulmonary/Chest: Normal effort and positive vesicular breath sounds. No respiratory distress. No wheezes, rales or ronchi noted.  Musculoskeletal: Unable to perform range of motion or thorough exam as he is barely unable to stand up from the wheelchair.  Unable to get him on the exam table. Neurological: Alert and oriented.    BMET    Component Value Date/Time   NA 124 (L) 12/10/2020 1113   NA 133 (A) 11/06/2018 0000   K 3.7 12/10/2020 1113   CL 87 (L) 12/10/2020 1113   CO2 30 12/10/2020 1113   GLUCOSE 93 12/10/2020 1113   BUN 19 12/10/2020 1113   BUN 12 11/06/2018 0000   CREATININE 0.77 12/10/2020 1113   CALCIUM 9.2 12/10/2020 1113   GFRNONAA 84 12/10/2020 1113   GFRAA 97 12/10/2020 1113    Lipid Panel     Component Value Date/Time   CHOL 157 12/12/2019 1107   CHOL 182 09/29/2015 0845   TRIG 89 12/12/2019 1107   HDL 94 12/12/2019 1107   HDL 75 09/29/2015 0845   CHOLHDL 1.7 12/12/2019 1107   VLDL 51 (H) 02/12/2016 0857   LDLCALC 46 12/12/2019 1107    CBC    Component Value Date/Time   WBC 6.1 12/10/2020 1113   RBC 3.14 (L) 12/10/2020 1113   HGB 11.3 (L) 12/10/2020 1113   HGB 12.7 09/29/2015 0845   HCT 33.1 (L) 12/10/2020 1113   HCT 37.9 09/29/2015 0845   PLT 164 12/10/2020 1113   PLT  263 09/29/2015 0845   MCV 105.4 (H) 12/10/2020 1113   MCV 105 (H) 09/29/2015 0845   MCV 107 (H) 01/04/2012 1053   MCH 36.0 (H) 12/10/2020 1113   MCHC 34.1 12/10/2020 1113   RDW 12.4 12/10/2020 1113   RDW 13.6 09/29/2015 0845   RDW 13.2 01/04/2012 1053   LYMPHSABS 1,312 12/10/2020 1113   LYMPHSABS 1.4 09/29/2015 0845   LYMPHSABS 1.2 01/04/2012 1053   MONOABS 0.4 11/26/2020 1315   MONOABS 0.4 01/04/2012 1053   EOSABS 220 12/10/2020 1113   EOSABS 0.1 09/29/2015 0845   EOSABS 0.1 01/04/2012 1053   BASOSABS 18 12/10/2020 1113   BASOSABS 0.0 09/29/2015 0845   BASOSABS 0.0 01/04/2012 1053    Hgb A1C Lab Results  Component Value Date   HGBA1C 4.7 12/12/2019            Assessment & Plan:   Acute Right Hip Pain s/p Fall:  X-ray right hip today Advised him to take Tylenol 650 mg 3 times daily OTC if he is unable to tolerate Hydrocodone He is also insisting on referral to orthopedics at this time, referral placed Encouraged ice and stretching while laying in the bed  We will follow-up after x-ray, return precautions discussed Webb Silversmith, NP This visit occurred during the SARS-CoV-2 public health emergency.  Safety protocols were in place, including screening questions prior to the visit,  additional usage of staff PPE, and extensive cleaning of exam room while observing appropriate contact time as indicated for disinfecting solutions.

## 2021-02-04 ENCOUNTER — Telehealth: Payer: Self-pay | Admitting: Family Medicine

## 2021-02-04 ENCOUNTER — Ambulatory Visit: Payer: Medicare Other | Attending: Infectious Diseases | Admitting: Infectious Diseases

## 2021-02-04 ENCOUNTER — Encounter: Payer: Self-pay | Admitting: Internal Medicine

## 2021-02-04 ENCOUNTER — Telehealth: Payer: Self-pay | Admitting: *Deleted

## 2021-02-04 ENCOUNTER — Other Ambulatory Visit
Admission: RE | Admit: 2021-02-04 | Discharge: 2021-02-04 | Disposition: A | Discharge status: Home / Self Care | Payer: Medicare Other | Source: Ambulatory Visit | Attending: Infectious Diseases | Admitting: Infectious Diseases

## 2021-02-04 DIAGNOSIS — R7881 Bacteremia: Secondary | ICD-10-CM

## 2021-02-04 DIAGNOSIS — Z8614 Personal history of Methicillin resistant Staphylococcus aureus infection: Secondary | ICD-10-CM

## 2021-02-04 DIAGNOSIS — Z888 Allergy status to other drugs, medicaments and biological substances status: Secondary | ICD-10-CM | POA: Insufficient documentation

## 2021-02-04 DIAGNOSIS — A4902 Methicillin resistant Staphylococcus aureus infection, unspecified site: Secondary | ICD-10-CM | POA: Insufficient documentation

## 2021-02-04 DIAGNOSIS — Z7982 Long term (current) use of aspirin: Secondary | ICD-10-CM | POA: Insufficient documentation

## 2021-02-04 DIAGNOSIS — G629 Polyneuropathy, unspecified: Secondary | ICD-10-CM | POA: Diagnosis not present

## 2021-02-04 DIAGNOSIS — I2510 Atherosclerotic heart disease of native coronary artery without angina pectoris: Secondary | ICD-10-CM | POA: Diagnosis not present

## 2021-02-04 DIAGNOSIS — Z951 Presence of aortocoronary bypass graft: Secondary | ICD-10-CM | POA: Insufficient documentation

## 2021-02-04 DIAGNOSIS — L02212 Cutaneous abscess of back [any part, except buttock]: Secondary | ICD-10-CM | POA: Diagnosis present

## 2021-02-04 DIAGNOSIS — Z885 Allergy status to narcotic agent status: Secondary | ICD-10-CM | POA: Diagnosis not present

## 2021-02-04 DIAGNOSIS — I10 Essential (primary) hypertension: Secondary | ICD-10-CM | POA: Diagnosis not present

## 2021-02-04 DIAGNOSIS — B9562 Methicillin resistant Staphylococcus aureus infection as the cause of diseases classified elsewhere: Secondary | ICD-10-CM

## 2021-02-04 DIAGNOSIS — Z79899 Other long term (current) drug therapy: Secondary | ICD-10-CM | POA: Diagnosis not present

## 2021-02-04 DIAGNOSIS — Z89412 Acquired absence of left great toe: Secondary | ICD-10-CM | POA: Insufficient documentation

## 2021-02-04 LAB — COMPREHENSIVE METABOLIC PANEL
ALT: 16 U/L (ref 0–44)
AST: 16 U/L (ref 15–41)
Albumin: 3.5 g/dL (ref 3.5–5.0)
Alkaline Phosphatase: 67 U/L (ref 38–126)
Anion gap: 12 (ref 5–15)
BUN: 17 mg/dL (ref 8–23)
CO2: 26 mmol/L (ref 22–32)
Calcium: 9 mg/dL (ref 8.9–10.3)
Chloride: 78 mmol/L — ABNORMAL LOW (ref 98–111)
Creatinine, Ser: 0.74 mg/dL (ref 0.61–1.24)
GFR, Estimated: >60 mL/min (ref >60)
Glucose, Bld: 105 mg/dL — ABNORMAL HIGH (ref 70–99)
Potassium: 3.4 mmol/L — ABNORMAL LOW (ref 3.5–5.1)
Sodium: 116 mmol/L — CL (ref 135–145)
Total Bilirubin: 1.1 mg/dL (ref 0.3–1.2)
Total Protein: 6.5 g/dL (ref 6.5–8.1)

## 2021-02-04 LAB — CBC WITH DIFFERENTIAL/PLATELET
Abs Immature Granulocytes: 0.05 10*3/uL (ref 0.00–0.07)
Basophils Absolute: 0 10*3/uL (ref 0.0–0.1)
Basophils Relative: 0 %
Eosinophils Absolute: 0.1 10*3/uL (ref 0.0–0.5)
Eosinophils Relative: 1 %
Hemoglobin: 10.9 g/dL — ABNORMAL LOW (ref 13.0–17.0)
Immature Granulocytes: 1 %
Lymphocytes Relative: 9 %
Lymphs Abs: 0.7 10*3/uL (ref 0.7–4.0)
Monocytes Absolute: 0.4 10*3/uL (ref 0.1–1.0)
Monocytes Relative: 6 %
Neutro Abs: 6.1 10*3/uL (ref 1.7–7.7)
Neutrophils Relative %: 83 %
Platelets: 248 10*3/uL (ref 150–400)
Smear Review: NORMAL
WBC: 7.4 10*3/uL (ref 4.0–10.5)

## 2021-02-04 MED ORDER — HIBICLENS 4 % EX LIQD: Freq: Every day | CUTANEOUS | 0 refills | Status: AC

## 2021-02-04 MED ORDER — LINEZOLID 600 MG PO TABS
600.0000 mg | ORAL_TABLET | Freq: Two times a day (BID) | ORAL | 0 refills | Status: DC
Start: 2021-02-04 — End: 2021-02-22

## 2021-02-04 NOTE — Telephone Encounter (Signed)
I just tried calling him. No answer and his voicemail is inactive.

## 2021-02-04 NOTE — Telephone Encounter (Signed)
'  [name]' with [facility] calling with critical value. NT called practice, Rachael, will route high priority. Na [phone].

## 2021-02-04 NOTE — Patient Instructions (Addendum)
You are here for follow up of MRSA infection- you should take 2 antibiotics Linezolid '600mg'$  PO BID and Rifampin '300mg'$  PO BID for 7 days. I called the pharmacy and they have both medicines to take.                                                                         To prepare yourself for your treatment, it is recommended that you complete the following steps: Remove nose, ear and other body piercing items for several days prior to the treatment and keep them out during the treatment period  Purchase a new toothbrush, disposable razor (if used), sterident for dentures (if required) and a container of alcohol hand hygiene solution (gel or rub)  Discard old toothbrushes and razors when the treatment starts. Also discard opened deodorant rollers, skin adhesive tapes, skin creams and solutions- all of these may already be contaminated with staph  Discard pumice stone(s), sponges and disposable face cloths if used   Discard all make-up brushes, creams, and implements  Discard or hot wash all fluffy toys  Wash hair brush and comb, nail files, plastic toys and cutters in the dishwasher or purchase new ones  Remove nail varnish and artificial nails  Daily routine for 5 days     *Minimize contact with members of the community during the 5 days of treatment as much as possible* Body washes The effectiveness of the program increases if the correct procedure is used: Apply the provided antiseptic body wash (4% chlorhexidine) in the shower daily  Take care to wash hair  under the arms and into the groin and into any folds of skin  Allow the antiseptic to remain on the skin for at least 5 minutes  >  Personal items (combs, razor, eyeglasses, jewelry, etc.)     Disinfect all personal items daily with an alcohol-based cleanser  House Environment and Clothes/Linens On day 2 and 5 of the treatment, clean your house, (especially the bedroom and bathroom). Clean dust off all surfaces and then vacuum clean all  floor surfaces AND soft furnishings (such as your favorite chair). If your chair/couch has a vinyl or leather covering then wipe over the chair with warm soapy water and then dry with a clean towel (which should then be washed). Staph lives in skin scales from humans that contaminate the environment. This can lead to re-infection.   Disinfect the shower floor and/or bath tub daily   On days 1, 3, AND 5 of the treatment wash your clothes, underwear, pajamas and bed linen (such as towels, sheets, washcloths, and bath mats). A hot wash with laundry detergent is best (there is no need to use expensive laundry detergent or powder). Dry clothes in sun if possible. Change into clean clothes or pajamas on those days after your shower.   Do not share or exchange any personal items of clothing  Sports/Gym     Surfaces, equipment and towels, and skin-to-skin contact are all potential sources for staph re-infection Pets Dogs and other companion animals can also be colonized with the same strains of MRSA. Best to wash or replace bedding material for the animal and wash the animal at least once during the treatment period with antiseptic  solution (2% chlorhexidine wash). Ensure that the skin of the animal is kept in good condition. If the pet has any chronic skin disorders, consult your vet prior to starting your treatment process. What about my partner, family, or household members? Usually when an aggressive strain of staph moves in to a family or household, only certain members of that group get infections (boils). This is despite the fact that the strain has probably transferred itself from person to person within the group. Those without boils may also be carrying the bacterium, however they must have better resistance (immunity) or perhaps have better skin condition. Staph likes to invade through cuts, scratches and skin with dermatitis or dryness.    Follow-up after decolonization treatment  Possible  approaches will vary depending on how your treatment goes. Your provider will instruct you on the follow-up best suited for you. Some options are:  Wait and see - if no further boils occur within 6 months then it is probable that the strain of staph has been eliminated from you.   Continue intermittent body washes 1-2 times per week with 2% aqueous chlorhexidine soap preparation or similar     References Wiese-Posselt et al. Clin Inf Dis [id number]  CDC:  StyleTurn.tn.html

## 2021-02-04 NOTE — Patient Instructions (Signed)
Hip Exercises Ask your health care provider which exercises are safe for you. Do exercises exactly as told by your health care provider and adjust them as directed. It is normal to feel mild stretching, pulling, tightness, or discomfort as you do these exercises. Stop right away if you feel sudden pain or your pain gets worse. Do not begin these exercises until told by your health care provider. Stretching and range-of-motion exercises These exercises warm up your muscles and joints and improve the movement and flexibility of your hip. These exercises also help to relieve pain, numbness, and tingling. You may be asked to limit your range of motion if you had a hip replacement. Talk to your health care provider about these restrictions. Hamstrings, supine  Lie on your back (supine position). Loop a belt or towel over the ball of your left / right foot. The ball of your foot is on the walking surface, right under your toes. Straighten your left / right knee and slowly pull on the belt or towel to raise your leg until you feel a gentle stretch behind your knee (hamstring). Do not let your knee bend while you do this. Keep your other leg flat on the floor. Hold this position for __________ seconds. Slowly return your leg to the starting position. Repeat __________ times. Complete this exercise __________ times a day. Hip rotation  Lie on your back on a firm surface. With your left / right hand, gently pull your left / right knee toward the shoulder that is on the same side of the body. Stop when your knee is pointing toward the ceiling. Hold your left / right ankle with your other hand. Keeping your knee steady, gently pull your left / right ankle toward your other shoulder until you feel a stretch in your buttocks. Keep your hips and shoulders firmly planted while you do this stretch. Hold this position for __________ seconds. Repeat __________ times. Complete this exercise __________ times a  day. Seated stretch This exercise is sometimes called hamstrings and adductors stretch. Sit on the floor with your legs stretched wide. Keep your knees straight during this exercise. Keeping your head and back in a straight line, bend at your waist to reach for your left foot (position A). You should feel a stretch in your right inner thigh (adductors). Hold this position for __________ seconds. Then slowly return to the upright position. Keeping your head and back in a straight line, bend at your waist to reach forward (position B). You should feel a stretch behind both of your thighs and knees (hamstrings). Hold this position for __________ seconds. Then slowly return to the upright position. Keeping your head and back in a straight line, bend at your waist to reach for your right foot (position C). You should feel a stretch in your left inner thigh (adductors). Hold this position for __________ seconds. Then slowly return to the upright position. Repeat __________ times. Complete this exercise __________ times a day. Lunge This exercise stretches the muscles of the hip (hip flexors). Place your left / right knee on the floor and bend your other knee so that is directly over your ankle. You should be half-kneeling. Keep good posture with your head over your shoulders. Tighten your buttocks to point your tailbone downward. This will prevent your back from arching too much. You should feel a gentle stretch in the front of your left / right thigh and hip. If you do not feel a stretch, slide your other foot   forward slightly and then slowly lunge forward with your chest up until your knee once again lines up over your ankle. Make sure your tailbone continues to point downward. Hold this position for __________ seconds. Slowly return to the starting position. Repeat __________ times. Complete this exercise __________ times a day. Strengthening exercises These exercises build strength and endurance  in your hip. Endurance is the ability to use your muscles for a long time, even after they get tired. Bridge This exercise strengthens the muscles of your hip (hip extensors). Lie on your back on a firm surface with your knees bent and your feet flat on the floor. Tighten your buttocks muscles and lift your bottom off the floor until the trunk of your body and your hips are level with your thighs. Do not arch your back. You should feel the muscles working in your buttocks and the back of your thighs. If you do not feel these muscles, slide your feet 1-2 inches (2.5-5 cm) farther away from your buttocks. Hold this position for __________ seconds. Slowly lower your hips to the starting position. Let your muscles relax completely between repetitions. Repeat __________ times. Complete this exercise __________ times a day. Straight leg raises, side-lying This exercise strengthens the muscles that move the hip joint away from the center of the body (hip abductors). Lie on your side with your left / right leg in the top position. Lie so your head, shoulder, hip, and knee line up. You may bend your bottom knee slightly to help you balance. Roll your hips slightly forward, so your hips are stacked directly over each other and your left / right knee is facing forward. Leading with your heel, lift your top leg 4-6 inches (10-15 cm). You should feel the muscles in your top hip lifting. Do not let your foot drift forward. Do not let your knee roll toward the ceiling. Hold this position for __________ seconds. Slowly return to the starting position. Let your muscles relax completely between repetitions. Repeat __________ times. Complete this exercise __________ times a day. Straight leg raises, side-lying This exercise strengthens the muscles that move the hip joint toward the center of the body (hip adductors). Lie on your side with your left / right leg in the bottom position. Lie so your head, shoulder,  hip, and knee line up. You may place your upper foot in front to help you balance. Roll your hips slightly forward, so your hips are stacked directly over each other and your left / right knee is facing forward. Tense the muscles in your inner thigh and lift your bottom leg 4-6 inches (10-15 cm). Hold this position for __________ seconds. Slowly return to the starting position. Let your muscles relax completely between repetitions. Repeat __________ times. Complete this exercise __________ times a day. Straight leg raises, supine This exercise strengthens the muscles in the front of your thigh (quadriceps). Lie on your back (supine position) with your left / right leg extended and your other knee bent. Tense the muscles in the front of your left / right thigh. You should see your kneecap slide up or see increased dimpling just above your knee. Keep these muscles tight as you raise your leg 4-6 inches (10-15 cm) off the floor. Do not let your knee bend. Hold this position for __________ seconds. Keep these muscles tense as you lower your leg. Relax the muscles slowly and completely between repetitions. Repeat __________ times. Complete this exercise __________ times a day. Hip abductors, standing This   exercise strengthens the muscles that move the leg and hip joint away from the center of the body (hip abductors). Tie one end of a rubber exercise band or tubing to a secure surface, such as a chair, table, or pole. Loop the other end of the band or tubing around your left / right ankle. Keeping your ankle with the band or tubing directly opposite the secured end, step away until there is tension in the tubing or band. Hold on to a chair, table, or pole as needed for balance. Lift your left / right leg out to your side. While you do this: Keep your back upright. Keep your shoulders over your hips. Keep your toes pointing forward. Make sure to use your hip muscles to slowly lift your leg. Do not  tip your body or forcefully lift your leg. Hold this position for __________ seconds. Slowly return to the starting position. Repeat __________ times. Complete this exercise __________ times a day. Squats This exercise strengthens the muscles in the front of your thigh (quadriceps). Stand in a door frame so your feet and knees are in line with the frame. You may place your hands on the frame for balance. Slowly bend your knees and lower your hips like you are going to sit in a chair. Keep your lower legs in a straight-up-and-down position. Do not let your hips go lower than your knees. Do not bend your knees lower than told by your health care provider. If your hip pain increases, do not bend as low. Hold this position for ___________ seconds. Slowly push with your legs to return to standing. Do not use your hands to pull yourself to standing. Repeat __________ times. Complete this exercise __________ times a day. This information is not intended to replace advice given to you by your health care provider. Make sure you discuss any questions you have with your health care provider. Document Revised: 01/03/2019 Document Reviewed: 04/10/2018 Elsevier Patient Education  2022 Elsevier Inc.  

## 2021-02-04 NOTE — Telephone Encounter (Signed)
I was just notified by his Infection Disease specialist he had a lab with Sodium 117. He usually averages 120-124 with low sodium.   Please notify patient of the low sodium result and ask him to limit excess free water intake, can take in up to about 30-50 oz water per day but avoid excess regular water intake. May have drinks containing sodium/potassium/electrolytes.  He should HOLD Hydrochlorothiazide blood pressure pill as well for 1 week. Approximatley.  We can re-check lab for Sodium within 1-2 weeks.  If new concerns or symptoms of any sudden confusion, neurological concerns such as passing out or seizure - please seek care immediately at hospital ED  [name], [facility] 02/04/2021, 2:25 PM

## 2021-02-04 NOTE — Progress Notes (Signed)
NAME: [name]  DOB: [birth year]  MRN: [id number]  Date/Time: 02/04/2021 11:45 AM  Subjective:  85 year old male with a history of hypertension, coronary artery disease, polyneuropathy, left foot infection with MRSA, history of MRSA bacteremia is here for a new infection on the back ? He saw his PCP last week and was given linezolid and rifampin after having the abscess lanced by Surgeon He got the linezolid and has been taking it Past h/o multiple MRSA infections 2/27-08/18/20 MRSA bacteremia and left foot infeciton- Iv daptomycin for 2 weeks followed by PO linezolid Following which the leg wound healed But he has had a few MRSA infeciton of the skin and soft tissue since then and has taken 2 courses of linezolid The recent infection is on the lumbar soft tiise- This was I/D by Dr.Diaz on 01/28/21 , no culture sent. He was prescribed Po linezolid and PO rifampin in an effort to treat and decolonize. He has taken only linezolid but not gotten  the rifampin prescription. He has no fever Has b/l hip pain due to arthritis- rt > left He is here by himself  Past Medical History:  Diagnosis Date   Arthritis    Benign essential tremor    Fracture, femur ([place]) 2012   GERD (gastroesophageal reflux disease)    H/O echocardiogram    05/2013   Hx of colonoscopy    04/22/2013   Hypercholesterolemia    Hypertension    Myocardial infarction [facility])    1987   Polyneuropathy    Prostate cancer ([place]) 2004   Superficial hematoma     Past Surgical History:  Procedure Laterality Date   APPLICATION OF WOUND VAC Left 08/12/2020   Procedure: APPLICATION OF WOUND VAC;  Surgeon: Samara Deist, DPM;  Location: [facility] ORS;  Service: Podiatry;  Laterality: Left;   BASAL CELL CARCINOMA EXCISION     COLONOSCOPY  05/01/2009   Positive for colonic polyps   COLONOSCOPY WITH PROPOFOL N/A 02/24/2016   Procedure: COLONOSCOPY WITH PROPOFOL;  Surgeon: Christene Lye, MD;  Location: [facility] ENDOSCOPY;  Service:  Endoscopy;  Laterality: N/A;   CORONARY ARTERY BYPASS GRAFT  1987   [place]   PROSTATE SURGERY  2004   TEE WITHOUT CARDIOVERSION N/A 08/13/2020   Procedure: TRANSESOPHAGEAL ECHOCARDIOGRAM (TEE);  Surgeon: Corey Skains, MD;  Location: [facility] ORS;  Service: Cardiovascular;  Laterality: N/A;   WOUND DEBRIDEMENT Left 08/12/2020   Procedure: DEBRIDEMENT WOUND-Left Heel;  Surgeon: Samara Deist, DPM;  Location: [facility] ORS;  Service: Podiatry;  Laterality: Left;    Social History   Socioeconomic History   Marital status: Married    Spouse name: Not on file   Number of children: Not on file   Years of education: Not on file   Highest education level: Not on file  Occupational History   Not on file  Tobacco Use   Smoking status: Never   Smokeless tobacco: Never  Vaping Use   Vaping Use: Never used  Substance and Sexual Activity   Alcohol use: Yes    Alcohol/week: 6.0 standard drinks    Types: 6 Glasses of wine per week    Comment: wine in the evening with dinner   Drug use: No   Sexual activity: Not on file  Other Topics Concern   Not on file  Social History Narrative   Not on file   Social Determinants of Health   Financial Resource Strain: Not on file  Food Insecurity: Not  on file  Transportation Needs: Not on file  Physical Activity: Not on file  Stress: Not on file  Social Connections: Not on file  Intimate Partner Violence: Not on file    Family History  Problem Relation Age of Onset   Heart disease Mother    Congestive Heart Failure Father    Allergies  Allergen Reactions   Codeine Nausea And Vomiting   Indocin [Indomethacin] Other (See Comments)   Latex Other (See Comments)    Blisters   Mupirocin     Blisters   Plavix [Clopidogrel Bisulfate]     GI intolerance, n/v   Polysporin [Bacitracin-Polymyxin B]     Blisters   Shellfish Allergy    Tape    Neosporin [Neomycin-Bacitracin Zn-Polymyx] Rash   Other Nausea And Vomiting    mussels    I? Current Outpatient Medications  Medication Sig Dispense Refill   Alpha Lipoic Acid 200 MG CAPS Take 2 capsules by mouth daily.     amLODipine (NORVASC) 2.5 MG tablet TAKE 1 TABLET BY MOUTH  DAILY 90 tablet 1   Apoaequorin 10 MG CAPS Take 1 capsule by mouth as directed.     aspirin 81 MG tablet Take 162 mg by mouth at bedtime.     Azelastine HCl 137 MCG/SPRAY SOLN PLACE 1 SPRAY INTO BOTH NOSTRILS 2 (TWO) TIMES DAILY. USE IN EACH NOSTRIL AS DIRECTED 30 mL 5   B Complex Vitamins (VITAMIN-B COMPLEX PO) Take 1 tablet by mouth daily.     Biotin 1000 MCG tablet Take 1,000 mcg by mouth daily.     Calcium Carb-Cholecalciferol 500-100 MG-UNIT CHEW Chew 500 mg by mouth daily.     cyanocobalamin 1000 MCG tablet Take 1,000 mcg by mouth daily.     diphenhydrAMINE (BENADRYL) 25 MG tablet Take 25 mg by mouth at bedtime as needed for allergies.     folic acid (FOLVITE) 1 MG tablet Take 1 mg by mouth daily.     furosemide (LASIX) 20 MG tablet TAKE 1 TABLET BY MOUTH  DAILY 90 tablet 1   gabapentin (NEURONTIN) 600 MG tablet TAKE 2 TABLETS BY MOUTH  TWICE DAILY 360 tablet 3   gentamicin cream (GARAMYCIN) 0.1 % APPLY TO AFFECTED AREA 1 TIMES DAILY     Glucosamine Sulfate 1000 MG CAPS Take 1 capsule by mouth daily.     HYDROcodone-acetaminophen (NORCO/VICODIN) 5-325 MG tablet Take 1 tablet by mouth every 6 (six) hours as needed for moderate pain. 30 tablet 0   Lecithin 400 MG CAPS Take 400 mg by mouth daily.     linezolid (ZYVOX) 600 MG tablet Take 1 tablet (600 mg total) by mouth 2 (two) times daily. 14 tablet 0   melatonin 5 MG TABS Take 5 mg by mouth at bedtime.     Omega 3 1000 MG CAPS Take 1,000 mg by mouth daily.     omeprazole (PRILOSEC) 20 MG capsule TAKE 1 CAPSULE BY MOUTH  TWICE DAILY BEFORE MEALS 180 capsule 3   potassium chloride SA (KLOR-CON) 20 MEQ tablet Take 1 tablet (20 mEq total) by mouth 2 (two) times daily. 180 tablet 3   pravastatin (PRAVACHOL) 80 MG tablet TAKE 1 TABLET BY MOUTH  DAILY  90 tablet 3   primidone (MYSOLINE) 50 MG tablet Take 1-2 tablets (50-100 mg total) by mouth See admin instructions. Take 2 tablets ('100mg'$ ) by mouth every morning and take 1 tablet ('50mg'$ ) by mouth every night 270 tablet 3   promethazine (PHENERGAN) 12.5 MG tablet Take  1-2 tablets (12.5-25 mg total) by mouth every 8 (eight) hours as needed for nausea or vomiting. Caution sedation. 30 tablet 0   rifampin (RIFADIN) 300 MG capsule Take 1 capsule (300 mg total) by mouth 2 (two) times daily. 14 capsule 0   Selenium 200 MCG CAPS Take 200 mcg by mouth daily.     tretinoin (RETIN-A) 0.05 % cream Apply 1 application topically at bedtime.     triamcinolone (KENALOG) 0.1 % Apply 1 application topically 2 (two) times daily. (apply to hands, legs and feet)     No current facility-administered medications for this visit.     Abtx:  Anti-infectives (From admission, onward)    None       REVIEW OF SYSTEMS:  Const: negative fever, negative chills, negative weight loss Eyes: negative diplopia or visual changes, negative eye pain ENT: hard of hearing  Resp: negative cough, hemoptysis, dyspnea Cards: negative for chest pain, palpitations, lower extremity edema GU: negative for frequency, dysuria and hematuria GI: Negative for abdominal pain, diarrhea, bleeding, constipation Skin: negative for rash and pruritus Heme: negative for easy bruising and gum/nose bleeding MS: back pain, hip pain, weakness  Neurolo:neuropathy hands and feet Psych: negative for feelings of anxiety, depression  Endocrine: negative for thyroid, diabetes Allergy/Immunology- as above ?  Objective:  VITALS:  BP 106/69   Pulse 76   Temp 98 F (36.7 C) (Oral)   Resp 16   Ht 6' (1.829 m)   Wt 206 lb (93.4 kg)   SpO2 95%   BMI 27.94 kg/m  PHYSICAL EXAM:  General: Alert, cooperative, no distress, appears stated age. In wheel chair, hard of hearing ENT cannot be examined because of mask Neck: Supple, symmetrical, no  adenopathy, thyroid: non tender no carotid bruit and no JVD. Back: small I/D soft tiiuse abscess in the lumbar region No erythema   Lungs: Clear to auscultation bilaterally. No Wheezing or Rhonchi. No rales. Heart: Regular rate and rhythm, no murmur, rub or gallop. 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 History of MRSA bacteremia feb 2022 due to infected foot- completed treatment  History of left great toe amputation  CAD status post CABG On Pravachol and aspirin  Peripheral neuropathy  We will do labs today. ___________________________________________________ P.S NA was 117 and I informed Dr.Karamalegos who will take care of it Note:  This document was prepared using Dragon voice recognition software and may include unintentional dictation errors.

## 2021-02-05 NOTE — Telephone Encounter (Signed)
I called the patient and notified him of his critical lab result and Dr. Parks Ranger recommendation. He verbalize understanding. I also scheduled and appt for him to come in for to get his sodium levels rechecked in 2weeks.

## 2021-02-09 ENCOUNTER — Other Ambulatory Visit: Payer: Self-pay

## 2021-02-09 DIAGNOSIS — Z8614 Personal history of Methicillin resistant Staphylococcus aureus infection: Secondary | ICD-10-CM

## 2021-02-10 ENCOUNTER — Telehealth: Payer: Self-pay

## 2021-02-10 NOTE — Telephone Encounter (Signed)
I spoke with the patient to follow up with him to see if he has been taking the linezolid and rifampin. Patient states as far as the antibiotics he is feeling fine, but still having some back pain.  [name] [name]

## 2021-02-17 ENCOUNTER — Ambulatory Visit: Payer: Self-pay | Admitting: *Deleted

## 2021-02-17 ENCOUNTER — Emergency Department: Payer: Medicare Other

## 2021-02-17 ENCOUNTER — Inpatient Hospital Stay
Admission: EM | Admit: 2021-02-17 | Discharge: 2021-02-22 | DRG: 478 | Disposition: A | Discharge status: Home Health | Payer: Medicare Other | Source: Ambulatory Visit | Attending: Internal Medicine | Admitting: Internal Medicine

## 2021-02-17 ENCOUNTER — Other Ambulatory Visit: Payer: Self-pay

## 2021-02-17 DIAGNOSIS — S32010A Wedge compression fracture of first lumbar vertebra, initial encounter for closed fracture: Secondary | ICD-10-CM | POA: Diagnosis not present

## 2021-02-17 DIAGNOSIS — Z881 Allergy status to other antibiotic agents status: Secondary | ICD-10-CM

## 2021-02-17 DIAGNOSIS — L03011 Cellulitis of right finger: Secondary | ICD-10-CM | POA: Diagnosis not present

## 2021-02-17 DIAGNOSIS — I2510 Atherosclerotic heart disease of native coronary artery without angina pectoris: Secondary | ICD-10-CM | POA: Diagnosis present

## 2021-02-17 DIAGNOSIS — H9190 Unspecified hearing loss, unspecified ear: Secondary | ICD-10-CM | POA: Diagnosis present

## 2021-02-17 DIAGNOSIS — E7800 Pure hypercholesterolemia, unspecified: Secondary | ICD-10-CM | POA: Diagnosis present

## 2021-02-17 DIAGNOSIS — Z9079 Acquired absence of other genital organ(s): Secondary | ICD-10-CM

## 2021-02-17 DIAGNOSIS — M25551 Pain in right hip: Secondary | ICD-10-CM | POA: Diagnosis not present

## 2021-02-17 DIAGNOSIS — Z79899 Other long term (current) drug therapy: Secondary | ICD-10-CM

## 2021-02-17 DIAGNOSIS — W050XXA Fall from non-moving wheelchair, initial encounter: Secondary | ICD-10-CM | POA: Diagnosis present

## 2021-02-17 DIAGNOSIS — E871 Hypo-osmolality and hyponatremia: Secondary | ICD-10-CM

## 2021-02-17 DIAGNOSIS — Z8614 Personal history of Methicillin resistant Staphylococcus aureus infection: Secondary | ICD-10-CM

## 2021-02-17 DIAGNOSIS — G629 Polyneuropathy, unspecified: Secondary | ICD-10-CM | POA: Diagnosis present

## 2021-02-17 DIAGNOSIS — S32030A Wedge compression fracture of third lumbar vertebra, initial encounter for closed fracture: Secondary | ICD-10-CM | POA: Diagnosis present

## 2021-02-17 DIAGNOSIS — Z91013 Allergy to seafood: Secondary | ICD-10-CM

## 2021-02-17 DIAGNOSIS — Z91040 Latex allergy status: Secondary | ICD-10-CM

## 2021-02-17 DIAGNOSIS — Z888 Allergy status to other drugs, medicaments and biological substances status: Secondary | ICD-10-CM

## 2021-02-17 DIAGNOSIS — S32020A Wedge compression fracture of second lumbar vertebra, initial encounter for closed fracture: Secondary | ICD-10-CM | POA: Diagnosis not present

## 2021-02-17 DIAGNOSIS — G250 Essential tremor: Secondary | ICD-10-CM | POA: Diagnosis present

## 2021-02-17 DIAGNOSIS — Z419 Encounter for procedure for purposes other than remedying health state, unspecified: Secondary | ICD-10-CM

## 2021-02-17 DIAGNOSIS — I5022 Chronic systolic (congestive) heart failure: Secondary | ICD-10-CM | POA: Diagnosis not present

## 2021-02-17 DIAGNOSIS — I10 Essential (primary) hypertension: Secondary | ICD-10-CM

## 2021-02-17 DIAGNOSIS — F0390 Unspecified dementia without behavioral disturbance: Secondary | ICD-10-CM | POA: Diagnosis present

## 2021-02-17 DIAGNOSIS — Z951 Presence of aortocoronary bypass graft: Secondary | ICD-10-CM

## 2021-02-17 DIAGNOSIS — I252 Old myocardial infarction: Secondary | ICD-10-CM

## 2021-02-17 DIAGNOSIS — Z7982 Long term (current) use of aspirin: Secondary | ICD-10-CM

## 2021-02-17 DIAGNOSIS — E860 Dehydration: Secondary | ICD-10-CM | POA: Diagnosis present

## 2021-02-17 DIAGNOSIS — Z885 Allergy status to narcotic agent status: Secondary | ICD-10-CM

## 2021-02-17 DIAGNOSIS — B9562 Methicillin resistant Staphylococcus aureus infection as the cause of diseases classified elsewhere: Secondary | ICD-10-CM | POA: Diagnosis present

## 2021-02-17 DIAGNOSIS — K219 Gastro-esophageal reflux disease without esophagitis: Secondary | ICD-10-CM | POA: Diagnosis present

## 2021-02-17 DIAGNOSIS — Z8546 Personal history of malignant neoplasm of prostate: Secondary | ICD-10-CM

## 2021-02-17 DIAGNOSIS — E861 Hypovolemia: Secondary | ICD-10-CM | POA: Diagnosis present

## 2021-02-17 DIAGNOSIS — Z993 Dependence on wheelchair: Secondary | ICD-10-CM

## 2021-02-17 DIAGNOSIS — Z20822 Contact with and (suspected) exposure to covid-19: Secondary | ICD-10-CM | POA: Diagnosis present

## 2021-02-17 DIAGNOSIS — Z8249 Family history of ischemic heart disease and other diseases of the circulatory system: Secondary | ICD-10-CM

## 2021-02-17 DIAGNOSIS — L0390 Cellulitis, unspecified: Secondary | ICD-10-CM

## 2021-02-17 DIAGNOSIS — I110 Hypertensive heart disease with heart failure: Secondary | ICD-10-CM | POA: Diagnosis present

## 2021-02-17 DIAGNOSIS — S32000A Wedge compression fracture of unspecified lumbar vertebra, initial encounter for closed fracture: Secondary | ICD-10-CM | POA: Diagnosis present

## 2021-02-17 DIAGNOSIS — L02212 Cutaneous abscess of back [any part, except buttock]: Secondary | ICD-10-CM | POA: Diagnosis present

## 2021-02-17 LAB — COMPREHENSIVE METABOLIC PANEL
ALT: 15 U/L (ref 0–44)
AST: 15 U/L (ref 15–41)
Albumin: 4 g/dL (ref 3.5–5.0)
Alkaline Phosphatase: 67 U/L (ref 38–126)
Anion gap: 11 (ref 5–15)
BUN: 11 mg/dL (ref 8–23)
CO2: 30 mmol/L (ref 22–32)
Calcium: 10 mg/dL (ref 8.9–10.3)
Chloride: 85 mmol/L — ABNORMAL LOW (ref 98–111)
Creatinine, Ser: 0.75 mg/dL (ref 0.61–1.24)
GFR, Estimated: >60 mL/min (ref >60)
Glucose, Bld: 119 mg/dL — ABNORMAL HIGH (ref 70–99)
Potassium: 2.9 mmol/L — ABNORMAL LOW (ref 3.5–5.1)
Sodium: 126 mmol/L — ABNORMAL LOW (ref 135–145)
Total Bilirubin: 0.5 mg/dL (ref 0.3–1.2)
Total Protein: 7.2 g/dL (ref 6.5–8.1)

## 2021-02-17 LAB — URINALYSIS, COMPLETE (UACMP) WITH MICROSCOPIC
Bacteria, UA: NONE SEEN
Bilirubin Urine: NEGATIVE
Glucose, UA: NEGATIVE mg/dL
Ketones, ur: NEGATIVE mg/dL
Leukocytes,Ua: NEGATIVE
Nitrite: NEGATIVE
Protein, ur: NEGATIVE mg/dL
Specific Gravity, Urine: 1.015 (ref 1.005–1.030)
Squamous Epithelial / HPF: NONE SEEN (ref 0–5)
pH: 7.5 (ref 5.0–8.0)

## 2021-02-17 LAB — CBC WITH DIFFERENTIAL/PLATELET
Abs Immature Granulocytes: 0.02 10*3/uL (ref 0.00–0.07)
Basophils Absolute: 0 10*3/uL (ref 0.0–0.1)
Basophils Relative: 1 %
Eosinophils Absolute: 0.1 10*3/uL (ref 0.0–0.5)
Eosinophils Relative: 2 %
Hemoglobin: 10.8 g/dL — ABNORMAL LOW (ref 13.0–17.0)
Immature Granulocytes: 1 %
Lymphocytes Relative: 23 %
Lymphs Abs: 0.9 10*3/uL (ref 0.7–4.0)
Monocytes Absolute: 0.8 10*3/uL (ref 0.1–1.0)
Monocytes Relative: 21 %
Neutro Abs: 2 10*3/uL (ref 1.7–7.7)
Neutrophils Relative %: 52 %
Platelets: 142 10*3/uL — ABNORMAL LOW (ref 150–400)
Smear Review: NORMAL
WBC: 3.8 10*3/uL — ABNORMAL LOW (ref 4.0–10.5)

## 2021-02-17 LAB — SARS CORONAVIRUS 2 (TAT 6-24 HRS): SARS Coronavirus 2: NEGATIVE

## 2021-02-17 LAB — SAMPLE TO BLOOD BANK

## 2021-02-17 MED ORDER — ONDANSETRON HCL 4 MG/2ML IJ SOLN
4.0000 mg | Freq: Once | INTRAMUSCULAR | Status: AC
Start: 2021-02-17 — End: 2021-02-17
  Administered 2021-02-17: 4 mg via INTRAVENOUS
  Filled 2021-02-17: qty 2

## 2021-02-17 MED ORDER — MORPHINE SULFATE (PF) 4 MG/ML IV SOLN
4.0000 mg | Freq: Once | INTRAVENOUS | Status: AC
Start: 2021-02-17 — End: 2021-02-17
  Administered 2021-02-17: 4 mg via INTRAVENOUS
  Filled 2021-02-17: qty 1

## 2021-02-17 MED ORDER — ONDANSETRON HCL 4 MG/2ML IJ SOLN
4.0000 mg | Freq: Once | INTRAMUSCULAR | Status: AC
  Administered 2021-02-18: 4 mg via INTRAVENOUS
  Filled 2021-02-17: qty 2

## 2021-02-17 MED ORDER — FENTANYL CITRATE PF 50 MCG/ML IJ SOSY
50.0000 ug | PREFILLED_SYRINGE | Freq: Once | INTRAMUSCULAR | Status: AC
Start: 2021-02-18 — End: 2021-02-18
  Administered 2021-02-18: 50 ug via INTRAVENOUS
  Filled 2021-02-17: qty 1

## 2021-02-17 MED ORDER — SODIUM CHLORIDE 0.9 % IV BOLUS
500.0000 mL | Freq: Once | INTRAVENOUS | Status: AC
  Administered 2021-02-17: 500 mL via INTRAVENOUS

## 2021-02-17 MED ORDER — POTASSIUM CHLORIDE CRYS ER 20 MEQ PO TBCR
40.0000 meq | EXTENDED_RELEASE_TABLET | Freq: Once | ORAL | Status: AC
  Administered 2021-02-17: 40 meq via ORAL
  Filled 2021-02-17: qty 2

## 2021-02-17 NOTE — ED Notes (Signed)
Resumed care from [facility].  Pt alert pt hard of hearing.  Iv in place.  nsr on monitor.  Pt reports back and right hip pain.

## 2021-02-17 NOTE — ED Notes (Signed)
Pt to mri 

## 2021-02-17 NOTE — ED Notes (Signed)
Pt in CT.

## 2021-02-17 NOTE — ED Provider Notes (Signed)
[facility] Emergency Department Provider Note ____________________________________________   Event Date/Time   First MD Initiated Contact with Patient 02/17/21 1245     (approximate)  I have reviewed the triage vital signs and the nursing notes.   HISTORY  Chief Complaint Hip Pain  HPI [name] is a 85 y.o. male with history of CAD, CHF and remaining history as listed below presents to the emergency department for treatment and evaluation of right hip pain.  Patient states that about 1 week ago he was sitting in his wheelchair and reached to the ground for something on the floor and slid out of his wheelchair on his right side.  Since that time he has been unable to walk.  He denies loss of consciousness.  He was not evaluated immediately afterward.  Pain has become progressively worse and has had no relief from tramadol..         Past Medical History:  Diagnosis Date   Arthritis    Benign essential tremor    Fracture, femur ([place]) 2012   GERD (gastroesophageal reflux disease)    H/O echocardiogram    05/2013   Hx of colonoscopy    04/22/2013   Hypercholesterolemia    Hypertension    Myocardial infarction [facility])    1987   Polyneuropathy    Prostate cancer ([place]) 2004   Superficial hematoma     Patient Active Problem List   Diagnosis Date Noted   Cellulitis and abscess of foot    MRSA bacteremia 08/10/2020   Sepsis ([place]) 04/24/2020   Cellulitis of knee, left 04/22/2020   Hypokalemia 04/22/2020   Osteoarthritis of multiple joints 12/15/2017   Hyponatremia 03/28/2017   Abnormality of gait and mobility 01/11/2017   Cloudy vision 01/11/2017   Foot drop, bilateral 01/11/2017   Amputee, great toe, left ([place]) 01/11/2017   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   Macrocytic anemia 09/22/2015   Moderate tricuspid insufficiency 04/21/2015   History of prostate cancer 04/16/2015   Idiopathic peripheral neuropathy 04/16/2015    Essential hypertension 04/16/2015   GERD (gastroesophageal reflux disease) 04/16/2015   Arthritis 04/16/2015   Bilateral lower extremity edema 04/16/2015   Benign essential tremor 10/30/2014   Hyperlipidemia, mixed [id number]   Chronic systolic CHF (congestive heart failure), NYHA class 2 ([place]) 04/23/2014   Mitral insufficiency 11/05/2013   CAD (coronary artery disease) 11/01/2013   Basal cell carcinoma of skin of other parts of face 03/14/2013   Ankle sprain 09/09/2011   History of fibula fracture 09/09/2011    Past Surgical History:  Procedure Laterality Date   APPLICATION OF WOUND VAC Left 08/12/2020   Procedure: APPLICATION OF WOUND VAC;  Surgeon: Samara Deist, DPM;  Location: [facility] ORS;  Service: Podiatry;  Laterality: Left;   BASAL CELL CARCINOMA EXCISION     COLONOSCOPY  05/01/2009   Positive for colonic polyps   COLONOSCOPY WITH PROPOFOL N/A 02/24/2016   Procedure: COLONOSCOPY WITH PROPOFOL;  Surgeon: Christene Lye, MD;  Location: [facility] ENDOSCOPY;  Service: Endoscopy;  Laterality: N/A;   CORONARY ARTERY BYPASS GRAFT  1987   [place]   PROSTATE SURGERY  2004   TEE WITHOUT CARDIOVERSION N/A 08/13/2020   Procedure: TRANSESOPHAGEAL ECHOCARDIOGRAM (TEE);  Surgeon: Corey Skains, MD;  Location: [facility] ORS;  Service: Cardiovascular;  Laterality: N/A;   WOUND DEBRIDEMENT Left 08/12/2020   Procedure: DEBRIDEMENT WOUND-Left Heel;  Surgeon: Samara Deist, DPM;  Location: [facility] ORS;  Service: Podiatry;  Laterality: Left;    Prior to Admission medications   Medication Sig Start Date End Date Taking? Authorizing Provider  Alpha Lipoic Acid 200 MG CAPS Take 2 capsules by mouth daily.   Yes [provider]  amLODipine (NORVASC) 2.5 MG tablet TAKE 1 TABLET BY MOUTH  DAILY 01/08/21  Yes Karamalegos, Alexander J, DO  Apoaequorin 10 MG CAPS Take 1 capsule by mouth as directed.   Yes [provider]  aspirin 81 MG tablet Take 162 mg by mouth at bedtime.    Yes [provider]  Azelastine HCl 137 MCG/SPRAY SOLN PLACE 1 SPRAY INTO BOTH NOSTRILS 2 (TWO) TIMES DAILY. USE IN EACH NOSTRIL AS DIRECTED 11/07/20  Yes Karamalegos, Devonne Doughty, DO  B Complex Vitamins (VITAMIN-B COMPLEX PO) Take 1 tablet by mouth daily.   Yes [provider]  Biotin 1000 MCG tablet Take 1,000 mcg by mouth daily.   Yes [provider]  Calcium Carb-Cholecalciferol 500-100 MG-UNIT CHEW Chew 500 mg by mouth daily.   Yes [provider]  chlorhexidine (HIBICLENS) 4 % external liquid Apply topically daily. Use to wash your body below neck 02/04/21  Yes Tsosie Billing, MD  cyanocobalamin 1000 MCG tablet Take 1,000 mcg by mouth daily.   Yes [provider]  diphenhydrAMINE (BENADRYL) 25 MG tablet Take 25 mg by mouth at bedtime as needed for allergies.   Yes [provider]  folic acid (FOLVITE) 1 MG tablet Take 1 mg by mouth daily.   Yes [provider]  furosemide (LASIX) 20 MG tablet TAKE 1 TABLET BY MOUTH  DAILY 01/08/21  Yes Karamalegos, Devonne Doughty, DO  gabapentin (NEURONTIN) 600 MG tablet TAKE 2 TABLETS BY MOUTH  TWICE DAILY 01/08/21  Yes Karamalegos, Devonne Doughty, DO  gentamicin cream (GARAMYCIN) 0.1 % APPLY TO AFFECTED AREA 1 TIMES DAILY 12/03/20  Yes [provider]  Glucosamine Sulfate 1000 MG CAPS Take 1 capsule by mouth daily.   Yes [provider]  HYDROcodone-acetaminophen (NORCO/VICODIN) 5-325 MG tablet Take 1 tablet by mouth every 6 (six) hours as needed for moderate pain. 01/28/21  Yes Karamalegos, Devonne Doughty, DO  Lecithin 400 MG CAPS Take 400 mg by mouth daily.   Yes [provider]  linezolid (ZYVOX) 600 MG tablet Take 1 tablet (600 mg total) by mouth 2 (two) times daily. 02/04/21  Yes Tsosie Billing, MD  losartan (COZAAR) 50 MG tablet Take 50 mg by mouth daily. 02/09/21  Yes [provider]  melatonin 5 MG TABS Take 5 mg by mouth at bedtime.   Yes [provider]  Omega 3 1000 MG CAPS Take 1,000 mg by mouth daily.   Yes [provider]  omeprazole (PRILOSEC) 20 MG capsule TAKE 1 CAPSULE BY MOUTH  TWICE DAILY BEFORE MEALS 01/08/21  Yes Karamalegos, Devonne Doughty, DO  potassium chloride SA (KLOR-CON) 20 MEQ tablet Take 1 tablet (20 mEq total) by mouth 2 (two) times daily. 09/30/20  Yes Karamalegos, Devonne Doughty, DO  pravastatin (PRAVACHOL) 80 MG tablet TAKE 1 TABLET BY MOUTH  DAILY 01/08/21  Yes Karamalegos, Devonne Doughty, DO  primidone (MYSOLINE) 50 MG tablet Take 1-2 tablets (50-100 mg total) by mouth See admin instructions. Take 2 tablets ('100mg'$ ) by mouth every morning and take 1 tablet ('50mg'$ ) by mouth every night 01/08/21  Yes Karamalegos, Devonne Doughty, DO  promethazine (PHENERGAN) 12.5 MG tablet Take 1-2 tablets (12.5-25 mg total) by mouth every 8 (eight) hours as needed for nausea or vomiting. Caution sedation. 01/28/21  Yes Karamalegos, Devonne Doughty, DO  rifampin (RIFADIN) 300 MG capsule Take 1 capsule (300 mg total) by mouth 2 (two) times daily. 01/28/21  Yes Karamalegos, Devonne Doughty, DO  Selenium 200 MCG CAPS Take 200 mcg by mouth daily.   Yes [provider]  tretinoin (RETIN-A) 0.05 % cream Apply 1 application topically at bedtime.   Yes [provider]  triamcinolone (KENALOG) 0.1 % Apply 1 application topically 2 (two) times daily. (apply to hands, legs and feet) 07/27/20  Yes [provider]    Allergies Codeine, Indocin [indomethacin], Latex, Mupirocin, Plavix [clopidogrel bisulfate], Polysporin [bacitracin-polymyxin b], Shellfish allergy, Tape, Neosporin [neomycin-bacitracin zn-polymyx], and Other  Family History  Problem Relation Age of Onset   Heart disease Mother    Congestive Heart Failure Father     Social History Social History   Tobacco Use   Smoking status: Never   Smokeless tobacco: Never  Vaping Use   Vaping Use: Never used  Substance Use Topics   Alcohol use: Yes    Alcohol/week:  6.0 standard drinks    Types: 6 Glasses of wine per week    Comment: wine in the evening with dinner   Drug use: No    Review of Systems  Constitutional: No fever/chills Eyes: No visual changes. ENT: No sore throat. Cardiovascular: Denies chest pain. Respiratory: Denies shortness of breath. Gastrointestinal: No abdominal pain.  No nausea, no vomiting.  No diarrhea.  No constipation. Genitourinary: Negative for dysuria. Musculoskeletal: Positive for back and right hip pain Skin: Negative for rash. Neurological: Negative for headaches, focal weakness or numbness. ____________________________________________   PHYSICAL EXAM:  VITAL SIGNS: ED Triage Vitals  Enc Vitals Group     BP 02/17/21 1234 (!) 144/90     Pulse Rate 02/17/21 1234 87     Resp 02/17/21 1234 17     Temp 02/17/21 1234 97.9 F (36.6 C)     Temp Source 02/17/21 1234 Oral     SpO2 02/17/21 1234 93 %     Weight 02/17/21 1235 189 lb 2.5 oz (85.8 kg)     Height 02/17/21 1235 6' (1.829 m)     Head Circumference --      Peak Flow --      Pain Score 02/17/21 1235 8     Pain Loc --      Pain Edu? --      Excl. in [place]? --     Constitutional: Alert and oriented.  Chronically ill appearing and in no acute distress. Eyes: Conjunctivae are normal.  Head: Atraumatic. Nose: No congestion/rhinnorhea. Mouth/Throat: Mucous membranes are moist.  Oropharynx non-erythematous. Neck: No stridor.   Hematological/Lymphatic/Immunilogical: No cervical lymphadenopathy. Cardiovascular: Normal rate, regular rhythm. Grossly normal heart sounds.  Good peripheral circulation. Respiratory: Normal respiratory effort.  No retractions. Lungs CTAB. Gastrointestinal: Soft and nontender. No distention. No abdominal bruits. Genitourinary:  Musculoskeletal: Tenderness over the right hip.  Pain of the right hip increases with internal and external rotation.. Neurologic:  Normal speech and language. No gross focal neurologic deficits are  appreciated. No gait instability. Skin:  Skin is warm, dry and intact. No rash noted. Psychiatric: Mood and affect are normal. Speech and behavior are normal.  ____________________________________________   LABS (all labs ordered are listed, but only abnormal results are displayed)  Labs Reviewed  COMPREHENSIVE METABOLIC PANEL - Abnormal; Notable for the following components:      Result Value   Sodium 126 (*)    Potassium 2.9 (*)  Chloride 85 (*)    Glucose, Bld 119 (*)    All other components within normal limits  CBC WITH DIFFERENTIAL/PLATELET - Abnormal; Notable for the following components:   WBC 3.8 (*)    Hemoglobin 10.8 (*)    Platelets 142 (*)    All other components within normal limits  URINALYSIS, COMPLETE (UACMP) WITH MICROSCOPIC - Abnormal; Notable for the following components:   Color, Urine YELLOW (*)    APPearance CLEAR (*)    Hgb urine dipstick TRACE (*)    All other components within normal limits  SARS CORONAVIRUS 2 (TAT 6-24 HRS)  SAMPLE TO BLOOD BANK   ____________________________________________  EKG  ED ECG REPORT I, Arlana Canizales, FNP-BC personally viewed and interpreted this ECG.   Date: 02/17/2021  EKG Time: 1239  Rate: 90  Rhythm: normal EKG, normal sinus rhythm  Axis: normal  Intervals:none  ST&T Change: no ST elevation  ____________________________________________  RADIOLOGY  ED MD interpretation:    Image of the right hip is negative for acute concerns.  CT of the right hip negative for acute findings.  I, Sherrie George, personally viewed and evaluated these images (plain radiographs) as part of my medical decision making, as well as reviewing the written report by the radiologist.  Official radiology report(s): DG Lumbar Spine 2-3 Views  Result Date: 02/17/2021 CLINICAL DATA:  Pain for 2 weeks after fall. Slight out of wheelchair. EXAM: LUMBAR SPINE - 2-3 VIEW COMPARISON:  None available. 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 The uppermost non-rib-bearing vertebra will be labeled L1. Severe L1 compression fracture with near complete loss of height centrally. There is slight buckling of the posterior cortex with focal kyphosis at this level. Moderate L3 compression fracture with approximately 25% loss of height. There is minimal cortical buckling. The bones are diffusely under mineralized. Multilevel degenerative disc disease most prominent at L4-L5 and L5-S1. Facet hypertrophy from L3-L4 through the lumbosacral junction. IMPRESSION: 1. Severe L1 and moderate L3 compression fractures, age indeterminate. 2. Multilevel degenerative change throughout the lumbar spine. 3. 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 The upper most lumbar vertebra is labeled L1 for numbering purposes. Electronically Signed   By: Keith Rake M.D.   On: 02/17/2021 17:28   CT Hip Right Wo Contrast  Result Date: 02/17/2021 CLINICAL DATA:  Right hip pain but negative conventional radiographic workup. EXAM: CT OF THE RIGHT HIP WITHOUT CONTRAST TECHNIQUE: Multidetector CT imaging of the right hip was performed according to the standard protocol. Multiplanar CT image reconstructions were also generated. COMPARISON:  Radiographs 02/17/2021 FINDINGS: Bones/Joint/Cartilage No acute fracture is identified. There is spurring of the acetabulum, femoral head, greater trochanter, and along portions of the femoral neck most compatible with degenerative findings. I do not see a well-defined fracture plane. MRI can be more sensitive in detecting stress injuries of bony structures if head degree of suspicion persists or if the patient is unable to bear weight. No hip joint effusion Ligaments Suboptimally assessed by CT. Muscles and Tendons No significant regional muscular hematoma. Expected contour of the hamstring tendon. No abnormal stranding within or  along the musculature. Soft tissues Arterial atherosclerosis noted. Prostatectomy. High density along the right groin skin crease likely from talcum patter or similar. No right sciatic impingement identified. Mild subcutaneous edema lateral to the greater trochanter may represent some regional bruising. IMPRESSION: 1. Potential bruising in the subcutaneous tissues overlying the greater trochanter, but  no visible fracture. Degenerative findings in the hip noted. MRI does have a higher sensitivity for subtle fractures and stress fractures, if there continues to be high clinical suspicion. 2. Atherosclerosis. 3. Prostatectomy. Electronically Signed   By: Van Clines M.D.   On: 02/17/2021 14:40   DG Hip Unilat  With Pelvis 2-3 Views Right  Result Date: 02/17/2021 CLINICAL DATA:  Fall, right hip pain EXAM: DG HIP (WITH OR WITHOUT PELVIS) 2-3V RIGHT COMPARISON:  02/03/2021 FINDINGS: Osteopenia. There is no evidence of hip fracture or dislocation. There is no evidence of arthropathy or other focal bone abnormality. Surgical clips about the low pelvis. IMPRESSION: Osteopenia. No displaced fracture or dislocation of the right hip. Joint space is preserved. Please note that plain radiographs are significantly insensitive for hip and pelvic fracture. Recommend CT or MRI to most sensitively evaluate if there is high clinical suspicion for fracture. Electronically Signed   By: Eddie Candle M.D.   On: 02/17/2021 13:48    ____________________________________________   PROCEDURES  Procedure(s) performed (including Critical Holt):  Procedures  ____________________________________________   INITIAL IMPRESSION / ASSESSMENT AND PLAN     85 year old male presenting to the emergency department for treatment and evaluation of right hip pain after sliding out of his wheelchair and hitting his right hip on the floor.  See HPI for further details.  On exam, I do not appreciate any shortening or rotation of the  right lower extremity.  Patient does have pain with internal or external rotation.  DIFFERENTIAL DIAGNOSIS  Hip fracture, hip strain  ED COURSE  X-ray and CT images of the right hip are not negative for acute concerns.  He also appears that he had an x-ray of the hip by primary Holt on 02/03/2021 which was also negative.  Patient now complaining of low back pain in addition to the right hip pain and states that his back started hurting right after the fall from his wheelchair.  Diagnostic images of the lower spine ordered.  His labs show hyponatremia and hypokalemia.  He was given a dose of potassium here.  Looks like he has chronic hyponatremia and labs today show that he is at his baseline.  ----------------------------------------- 6:09 PM on 02/17/2021 ----------------------------------------- Images of the lumbar spine show a severe compression fracture at L1 and moderate compression fracture of L3.  Do not see a history of compression fracture or recent MRI of the back.  Plan will be to get MRI to evaluate for retropulsed fragments or cord impingement.  Patient has had no bladder or bowel incontinence.  ----------------------------------------- 8:01 PM on 02/17/2021 ----------------------------------------- Holt transitioned to Dr. Cinda Quest who will follow up on MRI and arrange disposition.    ___________________________________________   FINAL CLINICAL IMPRESSION(S) / ED DIAGNOSES  Final diagnoses:  Compression fracture of L1 vertebra, initial encounter ([place])  Compression fracture of L3 vertebra, initial encounter [facility])     ED Discharge Orders     None        Shadeed Boardwine was evaluated in Emergency Department on 02/17/2021 for the symptoms described in the history of present illness. He was evaluated in the context of the global COVID-19 pandemic, which necessitated consideration that the patient might be at risk for infection with the SARS-CoV-2 virus that causes COVID-19.  Institutional protocols and algorithms that pertain to the evaluation of patients at risk for COVID-19 are in a state of rapid change based on information released by regulatory bodies including the CDC and federal and state organizations. These  policies and algorithms were followed during the patient's Holt in the ED.   Note:  This document was prepared using Dragon voice recognition software and may include unintentional dictation errors.    Victorino Dike, FNP 02/17/21 2001    Lucrezia Starch, MD 02/18/21 [phone]

## 2021-02-17 NOTE — Telephone Encounter (Signed)
Pt reports generalized weakness and increased back pain. Back pain ongoing x 2 weeks S/P fall, abscess. Pt states he has not been out of bed for 3 days, "Too much pain, weak." States unsure when last took meds "Maybe yesterday."  Reports urinating less (Urinal in bed). Pt sounds distressed. Advised ED. Pt states "Whatever doctor wants." NT called practice, ([facility]) for additional history as pt reluctant to triage; reiterated ED. Pt's helper 'Roderic Palau' with pt. Spoke to both pt and helper, advised EMS. Offered to call for pt, EMS called and advised. [place] and pt made aware, helper states he will stay with pt until EMS arrives.  Of note, pt states his wife fell yesterday and is in hospital with fractured hip. Reason for Disposition . [1] SEVERE weakness (i.e., unable to walk or barely able to walk, requires support) AND [2] new-onset or worsening  Answer Assessment - Initial Assessment Questions 1. DESCRIPTION: "Describe how you are feeling."     weak 2. SEVERITY: "How bad is it?"  "Can you stand and walk?"   - MILD - Feels weak or tired, but does not interfere with work, school or normal activities   - [place] to stand and walk; weakness interferes with work, school, or normal activities   - SEVERE - Unable to stand or walk     severe 3. ONSET:  "When did the weakness begin?"     3 days ago 4. CAUSE: "What do you think is causing the weakness?"    Back pain 5. MEDICINES: "Have you recently started a new medicine or had a change in the amount of a medicine?"     No 6. OTHER SYMPTOMS: "Do you have any other symptoms?" (e.g., chest pain, fever, cough, SOB, vomiting, diarrhea, bleeding, other areas of pain)     Decreased urine 7. PREGNANCY: "Is there any chance you are pregnant?" "When was your last menstrual period?"     *No Answer*  Protocols used: Weakness (Generalized) and Fatigue-A-AH

## 2021-02-17 NOTE — H&P (Signed)
History and Physical    Samhith Funt [id number] DOB: [birth year] DOA: 02/17/2021  PCP: [name], DO  Patient coming from: Home  I have personally briefly reviewed patient's old medical records in [place]  Chief Complaint: Fall and back pain  HPI: [name] is a 85 y.o. male with medical history significant for remote history of prostate cancer s/p prostatectomy, hypertension, CAD, chronic systolic heart failure, s/p left great toe MTP amputation, hx of MRSA bacteremia who presents with worsening back pain following a recent fall.  Patient had MRSA bacteremia and left foot infection in February requiring IV daptomycin for 2 weeks followed by p.o. linezolid.  He then later had lumbar soft tissue infection with I&D on 01/28/2021 he was treated with p.o. linezolid and p.o. rifampin.  Since then patient has been wheelchair-bound.  States about 2-1/2 weeks ago he fell out of his wheelchair landing on his right hip since it was not locked.  Has been experiencing severe back pain around lumbar spine.  Not taking anything for pain at home.  His wife was recently admitted here for a hip fracture and since then he has been laying at home with no one to help him get out of bed in the past few days.  Has not had anything to drink.  He finally called his doctor who sent EMS to bring him to the ED.  He endorsed some numbness and tingling of the right anterior thigh.  Denies any saddle anesthesia.  No urinary or bowel incontinent.  ED Course: He was afebrile, hypertensive up to systolic of [id number].  Hemoglobin of 10.8.  No leukocytosis.  Sodium of 126, K of 2.9, BG of 119  MRI showing acute compression fracture involving the L2 and L3 vertebral body with 15 to 20% height loss.  There is also severe chronic compression deformity at L1.  Edema around psoas and posterior paraspinous musculature suggesting muscular strain.  ED physician discussed findings with orthopedic Dr. Rudene Christians who will see  in consultation tomorrow for possible kyphoplasty.   Review of Systems: Constitutional: No Weight Change, No Fever ENT/Mouth: No sore throat, No Rhinorrhea Eyes: No Eye Pain, No Vision Changes Cardiovascular: No Chest Pain, no SOB, Respiratory: No Cough  Gastrointestinal: No Nausea, No Vomiting, No Diarrhea, No Pain Genitourinary: no Urinary Incontinence,  Musculoskeletal: No Arthralgias, No Myalgias Skin: No Skin Lesions, No Pruritus, Neuro: + Weakness, No Numbness Psych: No Anxiety/Panic, No Depression, no decrease appetite Heme/Lymph: No Bruising, No Bleeding  Past Medical History:  Diagnosis Date   Arthritis    Benign essential tremor    Fracture, femur ([place]) 2012   GERD (gastroesophageal reflux disease)    H/O echocardiogram    05/2013   Hx of colonoscopy    04/22/2013   Hypercholesterolemia    Hypertension    Myocardial infarction [facility] [place])    1987   Polyneuropathy    Prostate cancer ([place]) 2004   Superficial hematoma     Past Surgical History:  Procedure Laterality Date   APPLICATION OF WOUND VAC Left 08/12/2020   Procedure: APPLICATION OF WOUND VAC;  Surgeon: Samara Deist, DPM;  Location: [facility] ORS;  Service: Podiatry;  Laterality: Left;   BASAL CELL CARCINOMA EXCISION     COLONOSCOPY  05/01/2009   Positive for colonic polyps   COLONOSCOPY WITH PROPOFOL N/A 02/24/2016   Procedure: COLONOSCOPY WITH PROPOFOL;  Surgeon: Christene Lye, MD;  Location: [facility] ENDOSCOPY;  Service: Endoscopy;  Laterality: N/A;   CORONARY ARTERY BYPASS GRAFT  [place] SURGERY  2004   TEE WITHOUT CARDIOVERSION N/A 08/13/2020   Procedure: TRANSESOPHAGEAL ECHOCARDIOGRAM (TEE);  Surgeon: Corey Skains, MD;  Location: [facility] ORS;  Service: Cardiovascular;  Laterality: N/A;   WOUND DEBRIDEMENT Left 08/12/2020   Procedure: DEBRIDEMENT WOUND-Left Heel;  Surgeon: Samara Deist, DPM;  Location: [facility] ORS;  Service: Podiatry;  Laterality: Left;     reports  that he has never smoked. He has never used smokeless tobacco. He reports current alcohol use of about 6.0 standard drinks per week. He reports that he does not use drugs. Social History  Allergies  Allergen Reactions   Codeine Nausea And Vomiting   Indocin [Indomethacin] Other (See Comments)   Latex Other (See Comments)    Blisters   Mupirocin     Blisters   Plavix [Clopidogrel Bisulfate]     GI intolerance, n/v   Polysporin [Bacitracin-Polymyxin B]     Blisters   Shellfish Allergy    Tape    Neosporin [Neomycin-Bacitracin Zn-Polymyx] Rash   Other Nausea And Vomiting    mussels    Family History  Problem Relation Age of Onset   Heart disease Mother    Congestive Heart Failure Father      Prior to Admission medications   Medication Sig Start Date End Date Taking? Authorizing Provider  Alpha Lipoic Acid 200 MG CAPS Take 2 capsules by mouth daily.   Yes [provider]  amLODipine (NORVASC) 2.5 MG tablet TAKE 1 TABLET BY MOUTH  DAILY 01/08/21  Yes Karamalegos, Alexander J, DO  Apoaequorin 10 MG CAPS Take 1 capsule by mouth as directed.   Yes [provider]  aspirin 81 MG tablet Take 162 mg by mouth at bedtime.   Yes [provider]  Azelastine HCl 137 MCG/SPRAY SOLN PLACE 1 SPRAY INTO BOTH NOSTRILS 2 (TWO) TIMES DAILY. USE IN EACH NOSTRIL AS DIRECTED 11/07/20  Yes Karamalegos, Devonne Doughty, DO  B Complex Vitamins (VITAMIN-B COMPLEX PO) Take 1 tablet by mouth daily.   Yes [provider]  Biotin 1000 MCG tablet Take 1,000 mcg by mouth daily.   Yes [provider]  Calcium Carb-Cholecalciferol 500-100 MG-UNIT CHEW Chew 500 mg by mouth daily.   Yes [provider]  chlorhexidine (HIBICLENS) 4 % external liquid Apply topically daily. Use to wash your body below neck 02/04/21  Yes Tsosie Billing, MD  cyanocobalamin 1000 MCG tablet Take 1,000 mcg by mouth daily.   Yes [provider]  diphenhydrAMINE (BENADRYL) 25 MG  tablet Take 25 mg by mouth at bedtime as needed for allergies.   Yes [provider]  folic acid (FOLVITE) 1 MG tablet Take 1 mg by mouth daily.   Yes [provider]  furosemide (LASIX) 20 MG tablet TAKE 1 TABLET BY MOUTH  DAILY 01/08/21  Yes Karamalegos, Devonne Doughty, DO  gabapentin (NEURONTIN) 600 MG tablet TAKE 2 TABLETS BY MOUTH  TWICE DAILY 01/08/21  Yes Karamalegos, Devonne Doughty, DO  gentamicin cream (GARAMYCIN) 0.1 % APPLY TO AFFECTED AREA 1 TIMES DAILY 12/03/20  Yes [provider]  Glucosamine Sulfate 1000 MG CAPS Take 1 capsule by mouth daily.   Yes [provider]  HYDROcodone-acetaminophen (NORCO/VICODIN) 5-325 MG tablet Take 1 tablet by mouth every 6 (six) hours as needed for moderate pain. 01/28/21  Yes Karamalegos, Devonne Doughty, DO  Lecithin 400 MG CAPS Take 400 mg by mouth daily.   Yes [provider]  linezolid (ZYVOX) 600 MG tablet Take 1 tablet (600 mg total) by mouth 2 (two) times daily. 02/04/21  Yes Tsosie Billing, MD  losartan (COZAAR) 50 MG tablet Take 50 mg by mouth daily. 02/09/21  Yes [provider]  melatonin 5 MG TABS Take 5 mg by mouth at bedtime.   Yes [provider]  Omega 3 1000 MG CAPS Take 1,000 mg by mouth daily.   Yes [provider]  omeprazole (PRILOSEC) 20 MG capsule TAKE 1 CAPSULE BY MOUTH  TWICE DAILY BEFORE MEALS 01/08/21  Yes Karamalegos, Devonne Doughty, DO  potassium chloride SA (KLOR-CON) 20 MEQ tablet Take 1 tablet (20 mEq total) by mouth 2 (two) times daily. 09/30/20  Yes Karamalegos, Devonne Doughty, DO  pravastatin (PRAVACHOL) 80 MG tablet TAKE 1 TABLET BY MOUTH  DAILY 01/08/21  Yes Karamalegos, Devonne Doughty, DO  primidone (MYSOLINE) 50 MG tablet Take 1-2 tablets (50-100 mg total) by mouth See admin instructions. Take 2 tablets ('100mg'$ ) by mouth every morning and take 1 tablet ('50mg'$ ) by mouth every night 01/08/21  Yes Karamalegos, Devonne Doughty, DO  promethazine (PHENERGAN) 12.5 MG tablet Take  1-2 tablets (12.5-25 mg total) by mouth every 8 (eight) hours as needed for nausea or vomiting. Caution sedation. 01/28/21  Yes Karamalegos, Devonne Doughty, DO  rifampin (RIFADIN) 300 MG capsule Take 1 capsule (300 mg total) by mouth 2 (two) times daily. 01/28/21  Yes Karamalegos, Devonne Doughty, DO  Selenium 200 MCG CAPS Take 200 mcg by mouth daily.   Yes [provider]  tretinoin (RETIN-A) 0.05 % cream Apply 1 application topically at bedtime.   Yes [provider]  triamcinolone (KENALOG) 0.1 % Apply 1 application topically 2 (two) times daily. (apply to hands, legs and feet) 07/27/20  Yes [provider]    Physical Exam: Vitals:   02/17/21 2030 02/17/21 2045 02/17/21 2100 02/17/21 2115  BP: (!) 148/87  (!) 175/95   Pulse: 87 83 (!) 101 91  Resp: '13 14 15 12  '$ Temp:      TempSrc:      SpO2: 98% 98% 99% 97%  Weight:      Height:        Constitutional: NAD, disheveled appearing elderly male laying on left side flat in bed Vitals:   02/17/21 2030 02/17/21 2045 02/17/21 2100 02/17/21 2115  BP: (!) 148/87  (!) 175/95   Pulse: 87 83 (!) 101 91  Resp: '13 14 15 12  '$ Temp:      TempSrc:      SpO2: 98% 98% 99% 97%  Weight:      Height:       Eyes: PERRL, lids and conjunctivae normal ENMT: Mucous membranes are dry with scaly yellow crusting around lips  neck: normal, supple Respiratory: clear to auscultation bilaterally, no wheezing, no crackles. Normal respiratory effort. No accessory muscle use.  Cardiovascular: Regular rate and rhythm, no murmurs / rubs / gallops. No extremity edema.  Abdomen: no tenderness, no masses palpated. No hepatosplenomegaly. Bowel sounds positive.  Musculoskeletal: no clubbing / cyanosis.  Normal muscle tone.  all fingers held in flexion with DIP joint deformity. Erythema of the DIP surrounding nail bed of third digit of the right hand  Back: No obvious deformities or step-off.  Healing lower lumbar lesion with scabbing. Skin: no rashes,  lesions, ulcers. No induration Neurologic: CN 2-12 grossly intact. Sensation intact, DTR normal. Strength 5/5 in all 4.  Psychiatric: Normal judgment and insight. Alert and oriented x 3. Normal  mood.     Labs on Admission: I have personally reviewed following labs and imaging studies  CBC: Recent Labs  Lab 02/17/21 1247  WBC 3.8*  NEUTROABS 2.0  HGB 10.8*  HCT RESULTS UNAVAILABLE DUE TO INTERFERING SUBSTANCE  MCV RESULTS UNAVAILABLE DUE TO INTERFERING SUBSTANCE  PLT [id number]*   Basic Metabolic Panel: Recent Labs  Lab 02/17/21 1247  NA 126*  K 2.9*  CL 85*  CO2 30  GLUCOSE 119*  BUN 11  CREATININE 0.75  CALCIUM 10.0   GFR: Estimated Creatinine Clearance: 76.8 mL/min (by C-G formula based on SCr of 0.75 mg/dL). Liver Function Tests: Recent Labs  Lab 02/17/21 1247  AST 15  ALT 15  ALKPHOS 67  BILITOT 0.5  PROT 7.2  ALBUMIN 4.0   No results for input(s): LIPASE, AMYLASE in the last 168 hours. No results for input(s): AMMONIA in the last 168 hours. Coagulation Profile: No results for input(s): INR, PROTIME in the last 168 hours. Cardiac Enzymes: No results for input(s): CKTOTAL, CKMB, CKMBINDEX, TROPONINI in the last 168 hours. BNP (last 3 results) No results for input(s): PROBNP in the last 8760 hours. HbA1C: No results for input(s): HGBA1C in the last 72 hours. CBG: No results for input(s): GLUCAP in the last 168 hours. Lipid Profile: No results for input(s): CHOL, HDL, LDLCALC, TRIG, CHOLHDL, LDLDIRECT in the last 72 hours. Thyroid Function Tests: No results for input(s): TSH, T4TOTAL, FREET4, T3FREE, THYROIDAB in the last 72 hours. Anemia Panel: No results for input(s): VITAMINB12, FOLATE, FERRITIN, TIBC, IRON, RETICCTPCT in the last 72 hours. Urine analysis:    Component Value Date/Time   COLORURINE YELLOW (A) 02/17/2021 1710   APPEARANCEUR CLEAR (A) 02/17/2021 1710   LABSPEC 1.015 02/17/2021 1710   PHURINE 7.5 02/17/2021 1710   GLUCOSEU NEGATIVE  02/17/2021 1710   HGBUR TRACE (A) 02/17/2021 1710   BILIRUBINUR NEGATIVE 02/17/2021 1710   KETONESUR NEGATIVE 02/17/2021 1710   PROTEINUR NEGATIVE 02/17/2021 1710   NITRITE NEGATIVE 02/17/2021 1710   LEUKOCYTESUR NEGATIVE 02/17/2021 1710    Radiological Exams on Admission: DG Lumbar Spine 2-3 Views  Result Date: 02/17/2021 CLINICAL DATA:  Pain for 2 weeks after fall. Slight out of wheelchair. EXAM: LUMBAR SPINE - 2-3 VIEW COMPARISON:  None available. FINDINGS: Please note patient has transitional lumbosacral anatomy with suspected lumbarization of S1. The uppermost non-rib-bearing vertebra will be labeled L1. Severe L1 compression fracture with near complete loss of height centrally. There is slight buckling of the posterior cortex with focal kyphosis at this level. Moderate L3 compression fracture with approximately 25% loss of height. There is minimal cortical buckling. The bones are diffusely under mineralized. Multilevel degenerative disc disease most prominent at L4-L5 and L5-S1. Facet hypertrophy from L3-L4 through the lumbosacral junction. IMPRESSION: 1. Severe L1 and moderate L3 compression fractures, age indeterminate. 2. Multilevel degenerative change throughout the lumbar spine. 3. 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 The upper most lumbar vertebra is labeled L1 for numbering purposes. Electronically Signed   By: Keith Rake M.D.   On: 02/17/2021 17:28   MR LUMBAR SPINE WO CONTRAST  Result Date: 02/17/2021 CLINICAL DATA:  Initial evaluation for acute compression fracture. EXAM: MRI LUMBAR SPINE WITHOUT CONTRAST TECHNIQUE: Multiplanar, multisequence MR imaging of the lumbar spine was performed. No intravenous contrast was administered. COMPARISON:  Radiograph from earlier the same day. FINDINGS: Segmentation: Transitional lumbosacral anatomy with a partially lumbarized S1 segment and rudimentary S1-2 interspace. Alignment: Mild  levoscoliosis. Alignment otherwise  normal with preservation of the normal lumbar lordosis. Vertebrae: There is an acute compression fracture extending through the inferior endplate of L2. Associated height loss is relatively mild measuring approximately 15% without significant bony retropulsion. There is an additional acute compression fracture involving the superior endplate of L3. Associated height loss measures up to 20% without significant bony retropulsion. These are benign/mechanical in appearance. Severe chronic compression deformity with near complete height loss and vertebral plana formation noted at L1, chronic in appearance without significant residual marrow edema. Associated up to 5 mm bony retropulsion at the posterior/inferior aspect of the collapsed L1 vertebral body. Otherwise, vertebral body height maintained. Underlying bone marrow signal intensity is diffusely heterogeneous, likely reflecting osteoporosis/osteopenia. No worrisome osseous lesions. Additional mild discogenic reactive endplate change noted about the right aspect of the L4-5 interspace. No other abnormal marrow edema. Conus medullaris and cauda equina: Conus extends to the L1 level. Visualized conus medullaris is within normal limits. Nerve roots of the cauda equina are diffusely irregular and undulating related to underlying spinal stenosis, largely due to prominent epidural lipomatosis. Paraspinal and other soft tissues: Mild edema within the psoas musculature bilaterally adjacent to the acute compression fractures, right slightly worse than left. Additional mild edema within the lower posterior paraspinous musculature, suggesting associated muscular injury/strain. Underlying chronic atrophic changes noted throughout the visualized musculature. Few tiny simple cysts noted within the partially visualized right kidney. Visualized kidneys themselves are somewhat atrophic. Disc levels: T12-L1: Mild disc bulge with disc desiccation.  Mild facet hypertrophy. No significant spinal stenosis. Foramina remain patent. L1-2: Up to 5 mm bony retropulsion related to the chronic L1 compression fracture. Superimposed disc bulge with disc desiccation with mild to moderate facet hypertrophy. Prominence of the dorsal epidural fat. Resultant mild-to-moderate spinal stenosis. Mild-to-moderate bilateral L1 foraminal narrowing. L2-3: Disc bulge with disc desiccation. Mild-to-moderate bilateral facet hypertrophy. Prominent epidural lipomatosis with resultant moderate spinal stenosis. Moderate right with mild left L2 foraminal narrowing. L3-4: Degenerative intervertebral disc space narrowing with mild diffuse disc bulge. Associated reactive endplate spurring. Moderate facet and ligament flavum hypertrophy. Epidural lipomatosis. Resultant moderate to severe spinal stenosis, with the thecal sac measuring 5 mm in AP diameter, largely due to epidural lipomatosis. Mild left with moderate right L3 foraminal narrowing. L4-5: Degenerative intervertebral disc space narrowing with diffuse disc bulge and reactive endplate change. Superimposed left extraforaminal disc protrusion contacts the exiting left L4 nerve root (series 9, image 25). Moderate facet hypertrophy. Circumferential epidural lipomatosis. Resultant moderate spinal stenosis, largely due to epidural fat. Mild right with moderate left L4 foraminal narrowing. L5-S1: Degenerative intervertebral disc space narrowing with diffuse disc bulge, disc desiccation, and reactive endplate spurring. Moderate facet hypertrophy. Mild epidural lipomatosis. No significant spinal stenosis. Moderate left worse than right L5 foraminal narrowing. S1-2: Transitional lumbosacral anatomy with rudimentary S1-2 interspace. No disc bulge or focal disc herniation. Mild to moderate facet hypertrophy. No spinal stenosis. Foramina remain patent. IMPRESSION: 1. Acute compression fractures involving the L2 and L3 vertebral bodies with up to  15-20% height loss as above, but no significant bony retropulsion. These are benign/mechanical in appearance. 2. Diffuse edema within the psoas and posterior paraspinous musculature, suggesting a degree of associated muscular injury/strain. 3. Severe chronic compression deformity at L1 with near complete height loss. 4. Multilevel spondylosis with diffuse epidural lipomatosis throughout the lumbar spine, resulting in moderate to severe diffuse spinal stenosis, most pronounced at L3-4. Electronically Signed   By: Jeannine Boga M.D.   On: 02/17/2021 23:31   CT Hip  Right Wo Contrast  Result Date: 02/17/2021 CLINICAL DATA:  Right hip pain but negative conventional radiographic workup. EXAM: CT OF THE RIGHT HIP WITHOUT CONTRAST TECHNIQUE: Multidetector CT imaging of the right hip was performed according to the standard protocol. Multiplanar CT image reconstructions were also generated. COMPARISON:  Radiographs 02/17/2021 FINDINGS: Bones/Joint/Cartilage No acute fracture is identified. There is spurring of the acetabulum, femoral head, greater trochanter, and along portions of the femoral neck most compatible with degenerative findings. I do not see a well-defined fracture plane. MRI can be more sensitive in detecting stress injuries of bony structures if head degree of suspicion persists or if the patient is unable to bear weight. No hip joint effusion Ligaments Suboptimally assessed by CT. Muscles and Tendons No significant regional muscular hematoma. Expected contour of the hamstring tendon. No abnormal stranding within or along the musculature. Soft tissues Arterial atherosclerosis noted. Prostatectomy. High density along the right groin skin crease likely from talcum patter or similar. No right sciatic impingement identified. Mild subcutaneous edema lateral to the greater trochanter may represent some regional bruising. IMPRESSION: 1. Potential bruising in the subcutaneous tissues overlying the greater  trochanter, but no visible fracture. Degenerative findings in the hip noted. MRI does have a higher sensitivity for subtle fractures and stress fractures, if there continues to be high clinical suspicion. 2. Atherosclerosis. 3. Prostatectomy. Electronically Signed   By: Van Clines M.D.   On: 02/17/2021 14:40   DG Hip Unilat  With Pelvis 2-3 Views Right  Result Date: 02/17/2021 CLINICAL DATA:  Fall, right hip pain EXAM: DG HIP (WITH OR WITHOUT PELVIS) 2-3V RIGHT COMPARISON:  02/03/2021 FINDINGS: Osteopenia. There is no evidence of hip fracture or dislocation. There is no evidence of arthropathy or other focal bone abnormality. Surgical clips about the low pelvis. IMPRESSION: Osteopenia. No displaced fracture or dislocation of the right hip. Joint space is preserved. Please note that plain radiographs are significantly insensitive for hip and pelvic fracture. Recommend CT or MRI to most sensitively evaluate if there is high clinical suspicion for fracture. Electronically Signed   By: Eddie Candle M.D.   On: 02/17/2021 13:48      Assessment/Plan  Acute compression fracture of the L2 and L3 vertebral body with height loss Severe chronic compression deformity at L1 -PRN hydrocodone and IV morphine for pain -Orthopedic surgery is aware and will see in consultation tomorrow for potential kyphoplasty  Hyponatremia -Patient appears dehydrated on exam and has not been drinking since wife recently admitted to hospital and he had no one to help him get out of bed -Give 500 cc normal saline bolus and started on continuous IV 75 cc/h fluid  Paronychia/cellulitis of the third digit of the right hand recent MRSA back abscess -Patient does not recall mechanism of injury.  Wound was wrapped up by neighbor. -obtain right hand X-ray to assess for fracture or potential osteomyelitis  -already on Linezolid and Rifampin-consider ID consult since he is still getting infections on antibiotics  recent MRSA  back abscess -continue on linezolid and rifampin -follows with  [name] of ID  Chronic systolic heart failure - Patient appears hypovolemic on exam.  Hold Lasix while receiving fluid - Last EF of 55 to 60%  HTN -continue amlodipine, losartan  Hyperlipidemia - continue pravastatin   DVT prophylaxis:SCDs Code Status: Full Family Communication: Plan discussed with patient at bedside  disposition Plan: Home with observation Consults called:  Admission status: Observation    Level of care: Med-Surg  Status is:  Observation  The patient remains OBS appropriate and will d/c before 2 midnights.  Dispo: The patient is from: Home              Anticipated d/c is to: Home              Patient currently is not medically stable to d/c.   Difficult to place patient No         Orene Desanctis DO [facility]   If 7PM-7AM, please contact night-coverage [email or web address]   02/17/2021, 11:57 PM

## 2021-02-17 NOTE — ED Provider Notes (Addendum)
-----------------------------------------   11:34 PM on 02/17/2021 ----------------------------------------- Patient with severe compression fracture of L1.  MRI report is still not back at this time.  Patient denies any perineal numbness or urinary or stool incontinence.  He is reports his legs are not numb or tingly or weak.  He just hurts too much for him to walk he says.  He has not walked since he fell out of his wheelchair a week ago.  I will call the hospitalist and see if we can get this gentleman admitted.  I am awaiting the final MRI report.  I will call orthopedics as well possibly Dr. Rudene Christians can do kyphoplasty.  If anything is worse on the MRI we do have neurosurgery here.   Nena Polio, MD 02/17/21 2335  MRI report says there are acute compression fractures of L2 and 3 than L1 is chronic.  It did look a lot worse than the previous film that we had.  Patient still says he has minimal walk for the last week because of the pain.  We will get him in the hospital.  I spoke briefly with Dr. Rudene Christians who will be consulted tomorrow for possible kyphoplasty.   Nena Polio, MD 02/17/21 2351

## 2021-02-17 NOTE — ED Triage Notes (Addendum)
Pt to ER via [facility] from home. Pt with complaints of right hip pain, shortening and rotation present on assessment. Reports sitting in his wheelchair and reaching for something on the floor, when he slid out of his wheelchair onto his right side around a week ago. States wife was with him when it happened. Reports pain has become progressively worse. Unsure if he hit his head, dried blood present to left side of forehead. Denies LOC. Takes daily aspirin.   Pt also reports minimal PO intake over the last 5 days. Has been taking tramadol at home with no relief from pain. Reports last dose this AM. EMS administered '50mg'$  fentanyl IV PTA.  Ems vss  (Pt HOH).

## 2021-02-18 ENCOUNTER — Observation Stay: Payer: Medicare Other

## 2021-02-18 ENCOUNTER — Other Ambulatory Visit: Payer: Medicare Other

## 2021-02-18 ENCOUNTER — Encounter: Payer: Self-pay | Admitting: Family Medicine

## 2021-02-18 DIAGNOSIS — I110 Hypertensive heart disease with heart failure: Secondary | ICD-10-CM | POA: Diagnosis present

## 2021-02-18 DIAGNOSIS — Z7189 Other specified counseling: Secondary | ICD-10-CM | POA: Diagnosis not present

## 2021-02-18 DIAGNOSIS — W050XXA Fall from non-moving wheelchair, initial encounter: Secondary | ICD-10-CM | POA: Diagnosis present

## 2021-02-18 DIAGNOSIS — I252 Old myocardial infarction: Secondary | ICD-10-CM | POA: Diagnosis not present

## 2021-02-18 DIAGNOSIS — I2510 Atherosclerotic heart disease of native coronary artery without angina pectoris: Secondary | ICD-10-CM | POA: Diagnosis present

## 2021-02-18 DIAGNOSIS — G629 Polyneuropathy, unspecified: Secondary | ICD-10-CM | POA: Diagnosis present

## 2021-02-18 DIAGNOSIS — E861 Hypovolemia: Secondary | ICD-10-CM | POA: Diagnosis present

## 2021-02-18 DIAGNOSIS — S32010D Wedge compression fracture of first lumbar vertebra, subsequent encounter for fracture with routine healing: Secondary | ICD-10-CM | POA: Diagnosis not present

## 2021-02-18 DIAGNOSIS — G250 Essential tremor: Secondary | ICD-10-CM | POA: Diagnosis present

## 2021-02-18 DIAGNOSIS — M25551 Pain in right hip: Secondary | ICD-10-CM | POA: Diagnosis present

## 2021-02-18 DIAGNOSIS — L0390 Cellulitis, unspecified: Secondary | ICD-10-CM

## 2021-02-18 DIAGNOSIS — Z20822 Contact with and (suspected) exposure to covid-19: Secondary | ICD-10-CM | POA: Diagnosis present

## 2021-02-18 DIAGNOSIS — S32020A Wedge compression fracture of second lumbar vertebra, initial encounter for closed fracture: Secondary | ICD-10-CM | POA: Diagnosis present

## 2021-02-18 DIAGNOSIS — E860 Dehydration: Secondary | ICD-10-CM | POA: Diagnosis present

## 2021-02-18 DIAGNOSIS — I5022 Chronic systolic (congestive) heart failure: Secondary | ICD-10-CM | POA: Diagnosis present

## 2021-02-18 DIAGNOSIS — L02212 Cutaneous abscess of back [any part, except buttock]: Secondary | ICD-10-CM | POA: Diagnosis present

## 2021-02-18 DIAGNOSIS — Z951 Presence of aortocoronary bypass graft: Secondary | ICD-10-CM | POA: Diagnosis not present

## 2021-02-18 DIAGNOSIS — H9190 Unspecified hearing loss, unspecified ear: Secondary | ICD-10-CM | POA: Diagnosis present

## 2021-02-18 DIAGNOSIS — Z993 Dependence on wheelchair: Secondary | ICD-10-CM | POA: Diagnosis not present

## 2021-02-18 DIAGNOSIS — S32010A Wedge compression fracture of first lumbar vertebra, initial encounter for closed fracture: Secondary | ICD-10-CM | POA: Diagnosis not present

## 2021-02-18 DIAGNOSIS — Z9079 Acquired absence of other genital organ(s): Secondary | ICD-10-CM | POA: Diagnosis not present

## 2021-02-18 DIAGNOSIS — S32000A Wedge compression fracture of unspecified lumbar vertebra, initial encounter for closed fracture: Secondary | ICD-10-CM | POA: Diagnosis present

## 2021-02-18 DIAGNOSIS — L03011 Cellulitis of right finger: Secondary | ICD-10-CM | POA: Diagnosis present

## 2021-02-18 DIAGNOSIS — K219 Gastro-esophageal reflux disease without esophagitis: Secondary | ICD-10-CM | POA: Diagnosis present

## 2021-02-18 DIAGNOSIS — S32030A Wedge compression fracture of third lumbar vertebra, initial encounter for closed fracture: Secondary | ICD-10-CM | POA: Diagnosis present

## 2021-02-18 DIAGNOSIS — Z8546 Personal history of malignant neoplasm of prostate: Secondary | ICD-10-CM | POA: Diagnosis not present

## 2021-02-18 DIAGNOSIS — Z8614 Personal history of Methicillin resistant Staphylococcus aureus infection: Secondary | ICD-10-CM | POA: Diagnosis not present

## 2021-02-18 DIAGNOSIS — E7800 Pure hypercholesterolemia, unspecified: Secondary | ICD-10-CM | POA: Diagnosis present

## 2021-02-18 DIAGNOSIS — E871 Hypo-osmolality and hyponatremia: Secondary | ICD-10-CM | POA: Diagnosis present

## 2021-02-18 DIAGNOSIS — Z8249 Family history of ischemic heart disease and other diseases of the circulatory system: Secondary | ICD-10-CM | POA: Diagnosis not present

## 2021-02-18 DIAGNOSIS — F0390 Unspecified dementia without behavioral disturbance: Secondary | ICD-10-CM | POA: Diagnosis present

## 2021-02-18 LAB — BASIC METABOLIC PANEL
Anion gap: 11 (ref 5–15)
BUN: 9 mg/dL (ref 8–23)
CO2: 27 mmol/L (ref 22–32)
Calcium: 9.4 mg/dL (ref 8.9–10.3)
Chloride: 88 mmol/L — ABNORMAL LOW (ref 98–111)
Creatinine, Ser: 0.76 mg/dL (ref 0.61–1.24)
GFR, Estimated: >60 mL/min (ref >60)
Glucose, Bld: 91 mg/dL (ref 70–99)
Potassium: 2.9 mmol/L — ABNORMAL LOW (ref 3.5–5.1)
Sodium: 126 mmol/L — ABNORMAL LOW (ref 135–145)

## 2021-02-18 MED ORDER — GABAPENTIN 600 MG PO TABS
1200.0000 mg | ORAL_TABLET | Freq: Two times a day (BID) | ORAL | Status: DC
  Administered 2021-02-18 – 2021-02-22 (×9): 1200 mg via ORAL
  Filled 2021-02-18 (×9): qty 2

## 2021-02-18 MED ORDER — POTASSIUM CHLORIDE CRYS ER 20 MEQ PO TBCR
40.0000 meq | EXTENDED_RELEASE_TABLET | ORAL | Status: AC
  Administered 2021-02-18 (×2): 40 meq via ORAL
  Filled 2021-02-18 (×2): qty 2

## 2021-02-18 MED ORDER — PRIMIDONE 50 MG PO TABS: 50.0000 mg | ORAL_TABLET | ORAL | Status: DC

## 2021-02-18 MED ORDER — ONDANSETRON HCL 4 MG/2ML IJ SOLN
4.0000 mg | Freq: Four times a day (QID) | INTRAMUSCULAR | Status: DC | PRN
  Administered 2021-02-19: 4 mg via INTRAVENOUS
  Filled 2021-02-18: qty 2

## 2021-02-18 MED ORDER — SODIUM CHLORIDE 0.9 % IV SOLN: INTRAVENOUS | Status: AC

## 2021-02-18 MED ORDER — OXYCODONE HCL 5 MG PO TABS
5.0000 mg | ORAL_TABLET | ORAL | Status: DC | PRN
  Administered 2021-02-18 – 2021-02-22 (×9): 5 mg via ORAL
  Filled 2021-02-18 (×9): qty 1

## 2021-02-18 MED ORDER — ASPIRIN EC 81 MG PO TBEC
162.0000 mg | DELAYED_RELEASE_TABLET | Freq: Every day | ORAL | Status: DC
  Administered 2021-02-18 – 2021-02-22 (×4): 162 mg via ORAL
  Filled 2021-02-18 (×4): qty 2

## 2021-02-18 MED ORDER — PRIMIDONE 50 MG PO TABS
100.0000 mg | ORAL_TABLET | Freq: Every day | ORAL | Status: DC
  Administered 2021-02-18 – 2021-02-21 (×4): 100 mg via ORAL
  Filled 2021-02-18 (×6): qty 2

## 2021-02-18 MED ORDER — LINEZOLID 600 MG PO TABS
600.0000 mg | ORAL_TABLET | Freq: Two times a day (BID) | ORAL | Status: DC
  Administered 2021-02-18 (×2): 600 mg via ORAL
  Filled 2021-02-18 (×4): qty 1

## 2021-02-18 MED ORDER — FENTANYL CITRATE PF 50 MCG/ML IJ SOSY: 25.0000 ug | PREFILLED_SYRINGE | INTRAMUSCULAR | Status: DC | PRN

## 2021-02-18 MED ORDER — DOXYCYCLINE HYCLATE 100 MG PO TABS: 100.0000 mg | ORAL_TABLET | Freq: Two times a day (BID) | ORAL | Status: DC

## 2021-02-18 MED ORDER — MORPHINE SULFATE (PF) 2 MG/ML IV SOLN: 2.0000 mg | INTRAVENOUS | Status: DC | PRN

## 2021-02-18 MED ORDER — PANTOPRAZOLE SODIUM 40 MG PO TBEC
40.0000 mg | DELAYED_RELEASE_TABLET | Freq: Every day | ORAL | Status: DC
  Administered 2021-02-18 – 2021-02-22 (×4): 40 mg via ORAL
  Filled 2021-02-18 (×4): qty 1

## 2021-02-18 MED ORDER — HYDROCODONE-ACETAMINOPHEN 5-325 MG PO TABS
1.0000 | ORAL_TABLET | Freq: Four times a day (QID) | ORAL | Status: DC | PRN
  Administered 2021-02-18: 1 via ORAL
  Filled 2021-02-18: qty 1

## 2021-02-18 MED ORDER — MELATONIN 5 MG PO TABS
5.0000 mg | ORAL_TABLET | Freq: Every day | ORAL | Status: DC
  Administered 2021-02-18 – 2021-02-21 (×5): 5 mg via ORAL
  Filled 2021-02-18 (×5): qty 1

## 2021-02-18 MED ORDER — SODIUM CHLORIDE 0.9 % IV BOLUS
500.0000 mL | Freq: Once | INTRAVENOUS | Status: AC
  Administered 2021-02-18: 500 mL via INTRAVENOUS

## 2021-02-18 MED ORDER — VITAMIN B-12 1000 MCG PO TABS
1000.0000 ug | ORAL_TABLET | Freq: Every day | ORAL | Status: DC
  Administered 2021-02-18 – 2021-02-22 (×4): 1000 ug via ORAL
  Filled 2021-02-18 (×4): qty 1

## 2021-02-18 MED ORDER — SODIUM CHLORIDE 0.9 % IV SOLN: INTRAVENOUS | Status: DC

## 2021-02-18 MED ORDER — PRIMIDONE 50 MG PO TABS
50.0000 mg | ORAL_TABLET | Freq: Every morning | ORAL | Status: DC
  Administered 2021-02-18 – 2021-02-22 (×3): 50 mg via ORAL
  Filled 2021-02-18 (×9): qty 1

## 2021-02-18 MED ORDER — FOLIC ACID 1 MG PO TABS
1.0000 mg | ORAL_TABLET | Freq: Every day | ORAL | Status: DC
  Administered 2021-02-18 – 2021-02-22 (×4): 1 mg via ORAL
  Filled 2021-02-18 (×4): qty 1

## 2021-02-18 MED ORDER — LOSARTAN POTASSIUM 50 MG PO TABS
50.0000 mg | ORAL_TABLET | Freq: Every day | ORAL | Status: DC
  Administered 2021-02-18 – 2021-02-22 (×4): 50 mg via ORAL
  Filled 2021-02-18 (×4): qty 1

## 2021-02-18 MED ORDER — AMLODIPINE BESYLATE 5 MG PO TABS
2.5000 mg | ORAL_TABLET | Freq: Every day | ORAL | Status: DC
  Administered 2021-02-18 – 2021-02-22 (×4): 2.5 mg via ORAL
  Filled 2021-02-18 (×4): qty 1

## 2021-02-18 MED ORDER — PRAVASTATIN SODIUM 20 MG PO TABS
80.0000 mg | ORAL_TABLET | Freq: Every day | ORAL | Status: DC
  Administered 2021-02-18 – 2021-02-22 (×4): 80 mg via ORAL
  Filled 2021-02-18 (×4): qty 4

## 2021-02-18 MED ORDER — RIFAMPIN 300 MG PO CAPS
300.0000 mg | ORAL_CAPSULE | Freq: Two times a day (BID) | ORAL | Status: DC
  Administered 2021-02-18 (×2): 300 mg via ORAL
  Filled 2021-02-18 (×4): qty 1

## 2021-02-18 NOTE — Consult Note (Addendum)
Consultation Note Date: 02/18/2021   Patient Name: [name]  DOB: [birth year]  MRN: [id number]  Age / Sex: 85 y.o., male  PCP: Marcus Hauser, DO Referring Physician: Sidney Ace, MD  Reason for Consultation: Establishing goals of care  HPI/Patient Profile: 85 y.o. male with medical history significant for remote history of prostate cancer s/p prostatectomy, hypertension, CAD, chronic systolic heart failure, s/p left great toe MTP amputation, hx of MRSA bacteremia who presents with worsening back pain following a recent fall.  Clinical Assessment and Goals of Care: Patient is resting in bed. He opens his eyes briefly to say "I was asleep" and closed them again. Spoke with staff who state his wife is a patient currently in room 158 with a hip fracture. They advise the couple have no children and that patient had requested earlier to call [name], his brother in law.   Went to speak with wife at bedside.  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 She perseverates on the government and the great impact his job had on their lives.   Wife states her brother [name] would be surrogate decision maker for patient if she is unable.   Will follow up tomorrow.      SUMMARY OF RECOMMENDATIONS    Will follow up tomorrow.      Primary Diagnoses: Present on Admission:  Compression fracture of first lumbar vertebra (HCC)  Chronic systolic CHF (congestive heart failure), NYHA class 2 (HCC)  Hyponatremia  Essential hypertension   I have reviewed the medical record, interviewed the patient and family, and examined the patient. The following aspects are pertinent.  Past Medical History:  Diagnosis Date   Arthritis    Benign essential tremor    Fracture, femur ([facility]) 2012   GERD (gastroesophageal reflux disease)    H/O echocardiogram     05/2013   Hx of colonoscopy    04/22/2013   Hypercholesterolemia    Hypertension    Myocardial infarction [facility])    1987   Polyneuropathy    Prostate cancer ([place]) 2004   Superficial hematoma    Social History   Socioeconomic History   Marital status: Married    Spouse name: Not on file   Number of children: Not on file   Years of education: Not on file   Highest education level: Not on file  Occupational History   Not on file  Tobacco Use   Smoking status: Never   Smokeless tobacco: Never  Vaping Use   Vaping Use: Never used  Substance and Sexual Activity   Alcohol use: Yes    Alcohol/week: 6.0 standard drinks    Types: 6 Glasses of wine per week    Comment: wine in the evening with dinner   Drug use: No   Sexual activity: Not on file  Other Topics Concern   Not on file  Social History Narrative   Not on file   Social Determinants of Health  Financial Resource Strain: Not on file  Food Insecurity: Not on file  Transportation Needs: Not on file  Physical Activity: Not on file  Stress: Not on file  Social Connections: Not on file   Family History  Problem Relation Age of Onset   Heart disease Mother    Congestive Heart Failure Father    Scheduled Meds:  amLODipine  2.5 mg Oral Daily   aspirin EC  162 mg Oral Daily   folic acid  1 mg Oral Daily   gabapentin  1,200 mg Oral BID   linezolid  600 mg Oral BID   losartan  50 mg Oral Daily   melatonin  5 mg Oral QHS   pantoprazole  40 mg Oral Daily   pravastatin  80 mg Oral Daily   primidone  50 mg Oral q morning   And   primidone  100 mg Oral QHS   rifampin  300 mg Oral BID   cyanocobalamin  1,000 mcg Oral Daily   Continuous Infusions: PRN Meds:.morphine injection, ondansetron (ZOFRAN) IV, oxyCODONE Medications Prior to Admission:  Prior to Admission medications   Medication Sig Start Date End Date Taking? Authorizing Provider  Alpha Lipoic Acid 200 MG CAPS Take 2 capsules by mouth daily.   Yes  [provider]  amLODipine (NORVASC) 2.5 MG tablet TAKE 1 TABLET BY MOUTH  DAILY 01/08/21  Yes Karamalegos, Alexander J, DO  Apoaequorin 10 MG CAPS Take 1 capsule by mouth as directed.   Yes [provider]  aspirin 81 MG tablet Take 162 mg by mouth at bedtime.   Yes [provider]  Azelastine HCl 137 MCG/SPRAY SOLN PLACE 1 SPRAY INTO BOTH NOSTRILS 2 (TWO) TIMES DAILY. USE IN EACH NOSTRIL AS DIRECTED 11/07/20  Yes Karamalegos, Devonne Doughty, DO  B Complex Vitamins (VITAMIN-B COMPLEX PO) Take 1 tablet by mouth daily.   Yes [provider]  Biotin 1000 MCG tablet Take 1,000 mcg by mouth daily.   Yes [provider]  Calcium Carb-Cholecalciferol 500-100 MG-UNIT CHEW Chew 500 mg by mouth daily.   Yes [provider]  chlorhexidine (HIBICLENS) 4 % external liquid Apply topically daily. Use to wash your body below neck 02/04/21  Yes Marcus Billing, MD  cyanocobalamin 1000 MCG tablet Take 1,000 mcg by mouth daily.   Yes [provider]  diphenhydrAMINE (BENADRYL) 25 MG tablet Take 25 mg by mouth at bedtime as needed for allergies.   Yes [provider]  folic acid (FOLVITE) 1 MG tablet Take 1 mg by mouth daily.   Yes [provider]  furosemide (LASIX) 20 MG tablet TAKE 1 TABLET BY MOUTH  DAILY 01/08/21  Yes Karamalegos, Devonne Doughty, DO  gabapentin (NEURONTIN) 600 MG tablet TAKE 2 TABLETS BY MOUTH  TWICE DAILY 01/08/21  Yes Karamalegos, Devonne Doughty, DO  gentamicin cream (GARAMYCIN) 0.1 % APPLY TO AFFECTED AREA 1 TIMES DAILY 12/03/20  Yes [provider]  Glucosamine Sulfate 1000 MG CAPS Take 1 capsule by mouth daily.   Yes [provider]  HYDROcodone-acetaminophen (NORCO/VICODIN) 5-325 MG tablet Take 1 tablet by mouth every 6 (six) hours as needed for moderate pain. 01/28/21  Yes Karamalegos, Devonne Doughty, DO  Lecithin 400 MG CAPS Take 400 mg by mouth daily.   Yes [provider]  linezolid (ZYVOX)  600 MG tablet Take 1 tablet (600 mg total) by mouth 2 (two) times daily. 02/04/21  Yes Marcus Billing, MD  losartan (COZAAR) 50 MG tablet Take 50 mg by  mouth daily. 02/09/21  Yes [provider]  melatonin 5 MG TABS Take 5 mg by mouth at bedtime.   Yes [provider]  Omega 3 1000 MG CAPS Take 1,000 mg by mouth daily.   Yes [provider]  omeprazole (PRILOSEC) 20 MG capsule TAKE 1 CAPSULE BY MOUTH  TWICE DAILY BEFORE MEALS 01/08/21  Yes Karamalegos, Devonne Doughty, DO  potassium chloride SA (KLOR-CON) 20 MEQ tablet Take 1 tablet (20 mEq total) by mouth 2 (two) times daily. 09/30/20  Yes Karamalegos, Devonne Doughty, DO  pravastatin (PRAVACHOL) 80 MG tablet TAKE 1 TABLET BY MOUTH  DAILY 01/08/21  Yes Karamalegos, Devonne Doughty, DO  primidone (MYSOLINE) 50 MG tablet Take 1-2 tablets (50-100 mg total) by mouth See admin instructions. Take 2 tablets ('100mg'$ ) by mouth every morning and take 1 tablet ('50mg'$ ) by mouth every night 01/08/21  Yes Karamalegos, Devonne Doughty, DO  promethazine (PHENERGAN) 12.5 MG tablet Take 1-2 tablets (12.5-25 mg total) by mouth every 8 (eight) hours as needed for nausea or vomiting. Caution sedation. 01/28/21  Yes Karamalegos, Devonne Doughty, DO  rifampin (RIFADIN) 300 MG capsule Take 1 capsule (300 mg total) by mouth 2 (two) times daily. 01/28/21  Yes Karamalegos, Devonne Doughty, DO  Selenium 200 MCG CAPS Take 200 mcg by mouth daily.   Yes [provider]  tretinoin (RETIN-A) 0.05 % cream Apply 1 application topically at bedtime.   Yes [provider]  triamcinolone (KENALOG) 0.1 % Apply 1 application topically 2 (two) times daily. (apply to hands, legs and feet) 07/27/20  Yes [provider]   Allergies  Allergen Reactions   Codeine Nausea And Vomiting   Indocin [Indomethacin] Other (See Comments)   Latex Other (See Comments)    Blisters   Mupirocin     Blisters   Plavix [Clopidogrel Bisulfate]     GI intolerance, n/v   Polysporin  [Bacitracin-Polymyxin B]     Blisters   Shellfish Allergy    Tape    Neosporin [Neomycin-Bacitracin Zn-Polymyx] Rash   Other Nausea And Vomiting    mussels   Review of Systems  Unable to perform ROS  Physical Exam Constitutional:      Comments: Opens eyes briefly.     Vital Signs: BP (!) 104/56   Pulse 94   Temp 98 F (36.7 C) (Oral)   Resp 17   Ht 6' (1.829 m)   Wt 85.8 kg   SpO2 99%   BMI 25.65 kg/m  Pain Scale: 0-10   Pain Score: Asleep   SpO2: SpO2: 99 % O2 Device:SpO2: 99 % O2 Flow Rate: .   IO: Intake/output summary:  Intake/Output Summary (Last 24 hours) at 02/18/2021 1438 Last data filed at 02/18/2021 1418 Gross per 24 hour  Intake 945.03 ml  Output 400 ml  Net 545.03 ml    LBM:   Baseline Weight: Weight: 85.8 kg Most recent weight: Weight: 85.8 kg        Time In: 2:30 Time Out: 3:00 Time Total: 30 min Greater than 50%  of this time was spent counseling and coordinating care related to the above assessment and plan.  Signed by: Asencion Gowda, NP   Please contact Palliative Medicine Team phone at [phone] for questions and concerns.  For individual provider: See Shea Evans

## 2021-02-18 NOTE — Progress Notes (Signed)
PROGRESS NOTE    [name]  [id number] DOB: [birth year] DOA: 02/17/2021 PCP: [name], DO   Brief Narrative:  85 y.o. male with medical history significant for remote history of prostate cancer s/p prostatectomy, hypertension, CAD, chronic systolic heart failure, s/p left great toe MTP amputation, hx of MRSA bacteremia who presents with worsening back pain following a recent fall.   Patient had MRSA bacteremia and left foot infection in February requiring IV daptomycin for 2 weeks followed by p.o. linezolid.  He then later had lumbar soft tissue infection with I&D on 01/28/2021 he was treated with p.o. linezolid and p.o. rifampin.  Since then patient has been wheelchair-bound.  States about 2-1/2 weeks ago he fell out of his wheelchair landing on his right hip since it was not locked.  Has been experiencing severe back pain around lumbar spine.  Not taking anything for pain at home.  His wife was recently admitted here for a hip fracture and since then he has been laying at home with no one to help him get out of bed in the past few days.  Has not had anything to drink.  He finally called his doctor who sent EMS to bring him to the ED.  He endorsed some numbness and tingling of the right anterior thigh.  Denies any saddle anesthesia.  No urinary or bowel incontinent.  On presentation MRI imaging survey demonstrated lumbar vertebral disc disease associated with compression fracture of L2 and L3 vertebra.  Orthopedics was apparently made aware by EDP.  Pending consultation and recommendations regarding potential kyphoplasty   Assessment & Plan:   Principal Problem:   Compression fracture of first lumbar vertebra (HCC) Active Problems:   Essential hypertension   Chronic systolic CHF (congestive heart failure), NYHA class 2 (HCC)   Hyponatremia   Cellulitis  Acute compression fracture of the L2 and L3 vertebral body with height loss Severe chronic compression deformity at  L1 EDP note states that he spoke "briefly with Dr. Rudene Christians Dr. Rudene Christians states no one communicated with him about this patient Plan: Will make n.p.o. after midnight.  Orthopedics will see patient in the morning and placed on schedule for kyphoplasty 9/9.  In the meantime continue as needed oxycodone and morphine.    Hyponatremia Unclear chronicity.  We will started on 75 cc/h normal saline No change over interval Plan: Continue maintenance fluids for now Recheck BMP in a.m.   Paronychia/cellulitis of the third digit of the right hand recent MRSA back abscess Patient does not recall mechanism of injury.  Wound was wrapped up by neighbor. Right hand x-ray negative for osteomyelitis Plan: Continue PTA Zyvox and rifampin for now Will reach out to ID for further recommendations   recent MRSA back abscess -continue on linezolid and rifampin -follows with  [name] of ID   Chronic systolic heart failure - Patient appears hypovolemic on exam.  Hold Lasix while receiving fluid - Last EF of 55 to 60%   HTN -continue amlodipine, losartan   Hyperlipidemia - continue pravastatin    DVT prophylaxis: SCD Code Status: Full Family Communication: None today Disposition Plan: Status is: Observation  The patient will require care spanning > 2 midnights and should be moved to inpatient because: Inpatient level of care appropriate due to severity of illness  Dispo: The patient is from: Home              Anticipated d/c is to: SNF  Patient currently is not medically stable to d/c.   Difficult to place patient No       Level of care: Med-Surg  Consultants:  Orthopedics  Procedures:  None  Antimicrobials: Rifampin Zyvox   Subjective: Seen and examined.  History limited by lethargy and suspected underlying dementia.  Objective: Vitals:   02/18/21 1130 02/18/21 1200 02/18/21 1227 02/18/21 1300  BP: (!) 99/46 (!) 100/47  127/71  Pulse: 79 79 73 82  Resp: '14  12 14 12  '$ Temp:      TempSrc:      SpO2: 98% 100% 98% 100%  Weight:      Height:        Intake/Output Summary (Last 24 hours) at 02/18/2021 1329 Last data filed at 02/18/2021 1107 Gross per 24 hour  Intake 585.03 ml  Output 400 ml  Net 185.03 ml   Filed Weights   02/17/21 1235  Weight: 85.8 kg    Examination:  General exam: No acute distress.  Frail Respiratory system: Lungs clear.  Normal work of breathing.  Room air Cardiovascular system: S1-S2, RRR, no murmurs, no pedal edema Gastrointestinal system: Thin, soft, nontender, nondistended, normal bowel sounds Central nervous system: Alert to person and place only.  No focal deficits Extremities: Decreased power bilateral lower extremities Skin: No rashes, lesions or ulcers Psychiatry: Judgement and insight appear impaired. Mood & affect flattened.     Data Reviewed: I have personally reviewed following labs and imaging studies  CBC: Recent Labs  Lab 02/17/21 1247  WBC 3.8*  NEUTROABS 2.0  HGB 10.8*  HCT RESULTS UNAVAILABLE DUE TO INTERFERING SUBSTANCE  MCV RESULTS UNAVAILABLE DUE TO INTERFERING SUBSTANCE  PLT [id number]*   Basic Metabolic Panel: Recent Labs  Lab 02/17/21 1247 02/18/21 0700  NA 126* 126*  K 2.9* 2.9*  CL 85* 88*  CO2 30 27  GLUCOSE 119* 91  BUN 11 9  CREATININE 0.75 0.76  CALCIUM 10.0 9.4   GFR: Estimated Creatinine Clearance: 76.8 mL/min (by C-G formula based on SCr of 0.76 mg/dL). Liver Function Tests: Recent Labs  Lab 02/17/21 1247  AST 15  ALT 15  ALKPHOS 67  BILITOT 0.5  PROT 7.2  ALBUMIN 4.0   No results for input(s): LIPASE, AMYLASE in the last 168 hours. No results for input(s): AMMONIA in the last 168 hours. Coagulation Profile: No results for input(s): INR, PROTIME in the last 168 hours. Cardiac Enzymes: No results for input(s): CKTOTAL, CKMB, CKMBINDEX, TROPONINI in the last 168 hours. BNP (last 3 results) No results for input(s): PROBNP in the last 8760  hours. HbA1C: No results for input(s): HGBA1C in the last 72 hours. CBG: No results for input(s): GLUCAP in the last 168 hours. Lipid Profile: No results for input(s): CHOL, HDL, LDLCALC, TRIG, CHOLHDL, LDLDIRECT in the last 72 hours. Thyroid Function Tests: No results for input(s): TSH, T4TOTAL, FREET4, T3FREE, THYROIDAB in the last 72 hours. Anemia Panel: No results for input(s): VITAMINB12, FOLATE, FERRITIN, TIBC, IRON, RETICCTPCT in the last 72 hours. Sepsis Labs: No results for input(s): PROCALCITON, LATICACIDVEN in the last 168 hours.  Recent Results (from the past 240 hour(s))  SARS CORONAVIRUS 2 (TAT 6-24 HRS) Nasopharyngeal Nasopharyngeal Swab     Status: None   Collection Time: 02/17/21 12:48 PM   Specimen: Nasopharyngeal Swab  Result Value Ref Range Status   SARS Coronavirus 2 NEGATIVE NEGATIVE Final    Comment: (NOTE) SARS-CoV-2 target nucleic acids are NOT DETECTED.  The SARS-CoV-2 RNA is generally detectable in  upper and lower respiratory specimens during the acute phase of infection. Negative results do not preclude SARS-CoV-2 infection, do not rule out co-infections with other pathogens, and should not be used as the sole basis for treatment or other patient management decisions. Negative results must be combined with clinical observations, patient history, and epidemiological information. The expected result is Negative.  Fact Sheet for Patients: SugarRoll.be  Fact Sheet for Healthcare Providers: https://www.woods-mathews.com/  This test is not yet approved or cleared by the [place] FDA and  has been authorized for detection and/or diagnosis of SARS-CoV-2 by FDA under an Emergency Use Authorization (EUA). This EUA will remain  in effect (meaning this test can be used) for the duration of the COVID-19 declaration under Se ction 564(b)(1) of the Act, 21 U.S.C. section 360bbb-3(b)(1), unless the authorization is  terminated or revoked sooner.  Performed at [facility], [place] [address] [place], [place] [address]          Radiology Studies: DG Lumbar Spine 2-3 Views  Result Date: 02/17/2021 CLINICAL DATA:  Pain for 2 weeks after fall. Slight out of wheelchair. EXAM: LUMBAR SPINE - 2-3 VIEW COMPARISON:  None available. FINDINGS: Please note patient has transitional lumbosacral anatomy with suspected lumbarization of S1. The uppermost non-rib-bearing vertebra will be labeled L1. Severe L1 compression fracture with near complete loss of height centrally. There is slight buckling of the posterior cortex with focal kyphosis at this level. Moderate L3 compression fracture with approximately 25% loss of height. There is minimal cortical buckling. The bones are diffusely under mineralized. Multilevel degenerative disc disease most prominent at L4-L5 and L5-S1. Facet hypertrophy from L3-L4 through the lumbosacral junction. IMPRESSION: 1. Severe L1 and moderate L3 compression fractures, age indeterminate. 2. Multilevel degenerative change throughout the lumbar spine. 3. 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 The upper most lumbar vertebra is labeled L1 for numbering purposes. Electronically Signed   By: Keith Rake M.D.   On: 02/17/2021 17:28   MR LUMBAR SPINE WO CONTRAST  Result Date: 02/17/2021 CLINICAL DATA:  Initial evaluation for acute compression fracture. EXAM: MRI LUMBAR SPINE WITHOUT CONTRAST TECHNIQUE: Multiplanar, multisequence MR imaging of the lumbar spine was performed. No intravenous contrast was administered. COMPARISON:  Radiograph from earlier the same day. FINDINGS: Segmentation: Transitional lumbosacral anatomy with a partially lumbarized S1 segment and rudimentary S1-2 interspace. Alignment: Mild levoscoliosis. Alignment otherwise normal with preservation of the normal lumbar lordosis. Vertebrae: There is an acute compression fracture  extending through the inferior endplate of L2. Associated height loss is relatively mild measuring approximately 15% without significant bony retropulsion. There is an additional acute compression fracture involving the superior endplate of L3. Associated height loss measures up to 20% without significant bony retropulsion. These are benign/mechanical in appearance. Severe chronic compression deformity with near complete height loss and vertebral plana formation noted at L1, chronic in appearance without significant residual marrow edema. Associated up to 5 mm bony retropulsion at the posterior/inferior aspect of the collapsed L1 vertebral body. Otherwise, vertebral body height maintained. Underlying bone marrow signal intensity is diffusely heterogeneous, likely reflecting osteoporosis/osteopenia. No worrisome osseous lesions. Additional mild discogenic reactive endplate change noted about the right aspect of the L4-5 interspace. No other abnormal marrow edema. Conus medullaris and cauda equina: Conus extends to the L1 level. Visualized conus medullaris is within normal limits. Nerve roots of the cauda equina are diffusely irregular and undulating related to underlying spinal stenosis, largely due to prominent epidural  lipomatosis. Paraspinal and other soft tissues: Mild edema within the psoas musculature bilaterally adjacent to the acute compression fractures, right slightly worse than left. Additional mild edema within the lower posterior paraspinous musculature, suggesting associated muscular injury/strain. Underlying chronic atrophic changes noted throughout the visualized musculature. Few tiny simple cysts noted within the partially visualized right kidney. Visualized kidneys themselves are somewhat atrophic. Disc levels: T12-L1: Mild disc bulge with disc desiccation. Mild facet hypertrophy. No significant spinal stenosis. Foramina remain patent. L1-2: Up to 5 mm bony retropulsion related to the chronic L1  compression fracture. Superimposed disc bulge with disc desiccation with mild to moderate facet hypertrophy. Prominence of the dorsal epidural fat. Resultant mild-to-moderate spinal stenosis. Mild-to-moderate bilateral L1 foraminal narrowing. L2-3: Disc bulge with disc desiccation. Mild-to-moderate bilateral facet hypertrophy. Prominent epidural lipomatosis with resultant moderate spinal stenosis. Moderate right with mild left L2 foraminal narrowing. L3-4: Degenerative intervertebral disc space narrowing with mild diffuse disc bulge. Associated reactive endplate spurring. Moderate facet and ligament flavum hypertrophy. Epidural lipomatosis. Resultant moderate to severe spinal stenosis, with the thecal sac measuring 5 mm in AP diameter, largely due to epidural lipomatosis. Mild left with moderate right L3 foraminal narrowing. L4-5: Degenerative intervertebral disc space narrowing with diffuse disc bulge and reactive endplate change. Superimposed left extraforaminal disc protrusion contacts the exiting left L4 nerve root (series 9, image 25). Moderate facet hypertrophy. Circumferential epidural lipomatosis. Resultant moderate spinal stenosis, largely due to epidural fat. Mild right with moderate left L4 foraminal narrowing. L5-S1: Degenerative intervertebral disc space narrowing with diffuse disc bulge, disc desiccation, and reactive endplate spurring. Moderate facet hypertrophy. Mild epidural lipomatosis. No significant spinal stenosis. Moderate left worse than right L5 foraminal narrowing. S1-2: Transitional lumbosacral anatomy with rudimentary S1-2 interspace. No disc bulge or focal disc herniation. Mild to moderate facet hypertrophy. No spinal stenosis. Foramina remain patent. IMPRESSION: 1. Acute compression fractures involving the L2 and L3 vertebral bodies with up to 15-20% height loss as above, but no significant bony retropulsion. These are benign/mechanical in appearance. 2. Diffuse edema within the psoas  and posterior paraspinous musculature, suggesting a degree of associated muscular injury/strain. 3. Severe chronic compression deformity at L1 with near complete height loss. 4. Multilevel spondylosis with diffuse epidural lipomatosis throughout the lumbar spine, resulting in moderate to severe diffuse spinal stenosis, most pronounced at L3-4. Electronically Signed   By: Jeannine Boga M.D.   On: 02/17/2021 23:31   DG Hand 2 View Right  Result Date: 02/18/2021 CLINICAL DATA:  Wound at nailbed of right middle finger. Cellulitis. EXAM: RIGHT HAND - 2 VIEW COMPARISON:  None. FINDINGS: Advanced degenerative changes in the IP joints, most pronounced in the DIP joints as well as the 1st carpometacarpal joint. No acute bony abnormality. Specifically, no fracture, subluxation, or dislocation. No bone destruction to suggest osteomyelitis. Small radiopaque foreign body within the soft tissues in the right index finger. No soft tissue gas. IMPRESSION: No acute bony abnormality.  No evidence of osteomyelitis. Electronically Signed   By: Rolm Baptise M.D.   On: 02/18/2021 00:56   CT Hip Right Wo Contrast  Result Date: 02/17/2021 CLINICAL DATA:  Right hip pain but negative conventional radiographic workup. EXAM: CT OF THE RIGHT HIP WITHOUT CONTRAST TECHNIQUE: Multidetector CT imaging of the right hip was performed according to the standard protocol. Multiplanar CT image reconstructions were also generated. COMPARISON:  Radiographs 02/17/2021 FINDINGS: Bones/Joint/Cartilage No acute fracture is identified. There is spurring of the acetabulum, femoral head, greater trochanter, and along portions of the femoral  neck most compatible with degenerative findings. I do not see a well-defined fracture plane. MRI can be more sensitive in detecting stress injuries of bony structures if head degree of suspicion persists or if the patient is unable to bear weight. No hip joint effusion Ligaments Suboptimally assessed by CT.  Muscles and Tendons No significant regional muscular hematoma. Expected contour of the hamstring tendon. No abnormal stranding within or along the musculature. Soft tissues Arterial atherosclerosis noted. Prostatectomy. High density along the right groin skin crease likely from talcum patter or similar. No right sciatic impingement identified. Mild subcutaneous edema lateral to the greater trochanter may represent some regional bruising. IMPRESSION: 1. Potential bruising in the subcutaneous tissues overlying the greater trochanter, but no visible fracture. Degenerative findings in the hip noted. MRI does have a higher sensitivity for subtle fractures and stress fractures, if there continues to be high clinical suspicion. 2. Atherosclerosis. 3. Prostatectomy. Electronically Signed   By: Van Clines M.D.   On: 02/17/2021 14:40   DG Hip Unilat  With Pelvis 2-3 Views Right  Result Date: 02/17/2021 CLINICAL DATA:  Fall, right hip pain EXAM: DG HIP (WITH OR WITHOUT PELVIS) 2-3V RIGHT COMPARISON:  02/03/2021 FINDINGS: Osteopenia. There is no evidence of hip fracture or dislocation. There is no evidence of arthropathy or other focal bone abnormality. Surgical clips about the low pelvis. IMPRESSION: Osteopenia. No displaced fracture or dislocation of the right hip. Joint space is preserved. Please note that plain radiographs are significantly insensitive for hip and pelvic fracture. Recommend CT or MRI to most sensitively evaluate if there is high clinical suspicion for fracture. Electronically Signed   By: Eddie Candle M.D.   On: 02/17/2021 13:48        Scheduled Meds:  amLODipine  2.5 mg Oral Daily   aspirin EC  162 mg Oral Daily   folic acid  1 mg Oral Daily   gabapentin  1,200 mg Oral BID   linezolid  600 mg Oral BID   losartan  50 mg Oral Daily   melatonin  5 mg Oral QHS   pantoprazole  40 mg Oral Daily   pravastatin  80 mg Oral Daily   primidone  50 mg Oral q morning   And   primidone  100  mg Oral QHS   rifampin  300 mg Oral BID   cyanocobalamin  1,000 mcg Oral Daily   Continuous Infusions:   LOS: 0 days    Time spent: 35 minutes    Sidney Ace, MD [facility] Pager [phone]  If 7PM-7AM, please contact night-coverage 02/18/2021, 1:29 PM

## 2021-02-18 NOTE — ED Notes (Signed)
Nikki RN aware of assigned bed 

## 2021-02-18 NOTE — ED Notes (Signed)
Pt eating dinner meal.  meds given.  Pt alert.

## 2021-02-18 NOTE — ED Notes (Signed)
Pt moved to [facility] room 33.  Report to ami rn

## 2021-02-18 NOTE — Progress Notes (Signed)
Provided patient phone to call wife.

## 2021-02-19 ENCOUNTER — Inpatient Hospital Stay: Payer: Medicare Other | Admitting: Certified Registered Nurse Anesthetist

## 2021-02-19 ENCOUNTER — Encounter
Admission: EM | Disposition: A | Discharge status: Home Health | Payer: Self-pay | Source: Ambulatory Visit | Attending: Internal Medicine

## 2021-02-19 ENCOUNTER — Inpatient Hospital Stay: Payer: Medicare Other

## 2021-02-19 ENCOUNTER — Encounter: Payer: Self-pay | Admitting: Internal Medicine

## 2021-02-19 DIAGNOSIS — S32010D Wedge compression fracture of first lumbar vertebra, subsequent encounter for fracture with routine healing: Secondary | ICD-10-CM

## 2021-02-19 HISTORY — PX: KYPHOPLASTY: SHX5884

## 2021-02-19 LAB — BASIC METABOLIC PANEL
Anion gap: 8 (ref 5–15)
BUN: 11 mg/dL (ref 8–23)
CO2: 26 mmol/L (ref 22–32)
Calcium: 9.5 mg/dL (ref 8.9–10.3)
Chloride: 94 mmol/L — ABNORMAL LOW (ref 98–111)
Creatinine, Ser: 0.85 mg/dL (ref 0.61–1.24)
GFR, Estimated: >60 mL/min (ref >60)
Glucose, Bld: 106 mg/dL — ABNORMAL HIGH (ref 70–99)
Potassium: 3.5 mmol/L (ref 3.5–5.1)
Sodium: 128 mmol/L — ABNORMAL LOW (ref 135–145)

## 2021-02-19 LAB — CBC WITH DIFFERENTIAL/PLATELET
Abs Immature Granulocytes: 0.03 10*3/uL (ref 0.00–0.07)
Basophils Absolute: 0 10*3/uL (ref 0.0–0.1)
Basophils Relative: 1 %
Eosinophils Absolute: 0.2 10*3/uL (ref 0.0–0.5)
Eosinophils Relative: 5 %
HCT: 25.1 % — ABNORMAL LOW (ref 39.0–52.0)
Hemoglobin: 9.3 g/dL — ABNORMAL LOW (ref 13.0–17.0)
Immature Granulocytes: 1 %
Lymphocytes Relative: 30 %
Lymphs Abs: 1.4 10*3/uL (ref 0.7–4.0)
MCH: 37.7 pg — ABNORMAL HIGH (ref 26.0–34.0)
MCHC: 37.1 g/dL — ABNORMAL HIGH (ref 30.0–36.0)
MCV: 101.6 fL — ABNORMAL HIGH (ref 80.0–100.0)
Monocytes Absolute: 0.6 10*3/uL (ref 0.1–1.0)
Monocytes Relative: 12 %
Neutro Abs: 2.4 10*3/uL (ref 1.7–7.7)
Neutrophils Relative %: 51 %
Platelets: 204 10*3/uL (ref 150–400)
RBC: 2.47 MIL/uL — ABNORMAL LOW (ref 4.22–5.81)
RDW: 12.1 % (ref 11.5–15.5)
WBC: 4.8 10*3/uL (ref 4.0–10.5)
nRBC: 0 % (ref 0.0–0.2)

## 2021-02-19 LAB — SURGICAL PCR SCREEN
MRSA, PCR: POSITIVE — AB
Staphylococcus aureus: POSITIVE — AB

## 2021-02-19 LAB — MAGNESIUM: Magnesium: 1.7 mg/dL (ref 1.7–2.4)

## 2021-02-19 SURGERY — KYPHOPLASTY
Anesthesia: General

## 2021-02-19 MED ORDER — ONDANSETRON HCL 4 MG/2ML IJ SOLN: 4.0000 mg | Freq: Once | INTRAMUSCULAR | Status: DC | PRN

## 2021-02-19 MED ORDER — BUPIVACAINE-EPINEPHRINE (PF) 0.5% -1:200000 IJ SOLN
INTRAMUSCULAR | Status: AC
  Filled 2021-02-19: qty 30

## 2021-02-19 MED ORDER — FENTANYL CITRATE (PF) 100 MCG/2ML IJ SOLN
INTRAMUSCULAR | Status: AC
  Filled 2021-02-19: qty 2

## 2021-02-19 MED ORDER — SODIUM CHLORIDE 0.9 % IV SOLN
INTRAVENOUS | Status: AC | PRN
  Administered 2021-02-19: 20 mL

## 2021-02-19 MED ORDER — PROPOFOL 10 MG/ML IV BOLUS
INTRAVENOUS | Status: AC
  Filled 2021-02-19: qty 20

## 2021-02-19 MED ORDER — ONDANSETRON HCL 4 MG PO TABS: 4.0000 mg | ORAL_TABLET | Freq: Four times a day (QID) | ORAL | Status: DC | PRN

## 2021-02-19 MED ORDER — ADULT MULTIVITAMIN W/MINERALS CH
1.0000 | ORAL_TABLET | Freq: Every day | ORAL | Status: DC
  Administered 2021-02-20 – 2021-02-22 (×3): 1 via ORAL
  Filled 2021-02-19 (×3): qty 1

## 2021-02-19 MED ORDER — BUPIVACAINE-EPINEPHRINE (PF) 0.5% -1:200000 IJ SOLN
INTRAMUSCULAR | Status: DC | PRN
  Administered 2021-02-19: 20 mL

## 2021-02-19 MED ORDER — ENSURE ENLIVE PO LIQD
237.0000 mL | ORAL | Status: DC
  Administered 2021-02-20 – 2021-02-22 (×3): 237 mL via ORAL

## 2021-02-19 MED ORDER — CEFAZOLIN SODIUM-DEXTROSE 2-4 GM/100ML-% IV SOLN
2.0000 g | Freq: Once | INTRAVENOUS | Status: AC
  Administered 2021-02-19: 2 g via INTRAVENOUS

## 2021-02-19 MED ORDER — METOCLOPRAMIDE HCL 10 MG PO TABS: 5.0000 mg | ORAL_TABLET | Freq: Three times a day (TID) | ORAL | Status: DC | PRN

## 2021-02-19 MED ORDER — DOCUSATE SODIUM 100 MG PO CAPS
100.0000 mg | ORAL_CAPSULE | Freq: Two times a day (BID) | ORAL | Status: DC
  Administered 2021-02-19 – 2021-02-22 (×6): 100 mg via ORAL
  Filled 2021-02-19 (×6): qty 1

## 2021-02-19 MED ORDER — FENTANYL CITRATE (PF) 100 MCG/2ML IJ SOLN
INTRAMUSCULAR | Status: DC | PRN
  Administered 2021-02-19: 50 ug via INTRAVENOUS

## 2021-02-19 MED ORDER — IOHEXOL 180 MG/ML  SOLN
INTRAMUSCULAR | Status: DC | PRN
  Administered 2021-02-19: 10 mL

## 2021-02-19 MED ORDER — ONDANSETRON HCL 4 MG/2ML IJ SOLN: 4.0000 mg | Freq: Four times a day (QID) | INTRAMUSCULAR | Status: DC | PRN

## 2021-02-19 MED ORDER — FENTANYL CITRATE (PF) 100 MCG/2ML IJ SOLN: 25.0000 ug | INTRAMUSCULAR | Status: DC | PRN

## 2021-02-19 MED ORDER — CEFAZOLIN SODIUM-DEXTROSE 2-4 GM/100ML-% IV SOLN
INTRAVENOUS | Status: AC
  Filled 2021-02-19: qty 100

## 2021-02-19 MED ORDER — LIDOCAINE HCL (PF) 1 % IJ SOLN
INTRAMUSCULAR | Status: AC
  Filled 2021-02-19: qty 30

## 2021-02-19 MED ORDER — ENOXAPARIN SODIUM 40 MG/0.4ML IJ SOSY
40.0000 mg | PREFILLED_SYRINGE | INTRAMUSCULAR | Status: DC
  Administered 2021-02-20 – 2021-02-22 (×3): 40 mg via SUBCUTANEOUS
  Filled 2021-02-19 (×3): qty 0.4

## 2021-02-19 MED ORDER — PROPOFOL 500 MG/50ML IV EMUL
INTRAVENOUS | Status: DC | PRN
  Administered 2021-02-19: 50 ug/kg/min via INTRAVENOUS

## 2021-02-19 MED ORDER — LIDOCAINE HCL 1 % IJ SOLN
INTRAMUSCULAR | Status: DC | PRN
  Administered 2021-02-19: 30 mL

## 2021-02-19 MED ORDER — MUPIROCIN 2 % EX OINT
1.0000 "application " | TOPICAL_OINTMENT | Freq: Two times a day (BID) | CUTANEOUS | Status: DC
  Administered 2021-02-20 – 2021-02-22 (×4): 1 via NASAL
  Filled 2021-02-19: qty 22

## 2021-02-19 MED ORDER — PROPOFOL 10 MG/ML IV BOLUS
INTRAVENOUS | Status: DC | PRN
  Administered 2021-02-19 (×3): 10 mg via INTRAVENOUS
  Administered 2021-02-19: 20 mg via INTRAVENOUS
  Administered 2021-02-19: 10 mg via INTRAVENOUS

## 2021-02-19 MED ORDER — METOCLOPRAMIDE HCL 5 MG/ML IJ SOLN: 5.0000 mg | Freq: Three times a day (TID) | INTRAMUSCULAR | Status: DC | PRN

## 2021-02-19 SURGICAL SUPPLY — 21 items
CEMENT KYPHON CX01A KIT/MIXER (Cement) ×2 IMPLANT
DERMABOND ADVANCED (GAUZE/BANDAGES/DRESSINGS) ×1
DERMABOND ADVANCED .7 DNX12 (GAUZE/BANDAGES/DRESSINGS) ×1 IMPLANT
DEVICE BIOPSY BONE KYPHX (INSTRUMENTS) ×2 IMPLANT
DRAPE C-ARM XRAY 36X54 (DRAPES) ×2 IMPLANT
DURAPREP 26ML APPLICATOR (WOUND CARE) ×2 IMPLANT
GAUZE 4X4 16PLY ~~LOC~~+RFID DBL (SPONGE) ×2 IMPLANT
GLOVE SURG SYN 9.0  PF PI (GLOVE) ×1
GLOVE SURG SYN 9.0 PF PI (GLOVE) ×1 IMPLANT
GOWN SRG 2XL LVL 4 RGLN SLV (GOWNS) ×1 IMPLANT
GOWN STRL NON-REIN 2XL LVL4 (GOWNS) ×1
GOWN STRL REUS W/ TWL LRG LVL3 (GOWN DISPOSABLE) ×1 IMPLANT
GOWN STRL REUS W/TWL LRG LVL3 (GOWN DISPOSABLE) ×1
MANIFOLD NEPTUNE II (INSTRUMENTS) ×2 IMPLANT
PACK KYPHOPLASTY (MISCELLANEOUS) ×2 IMPLANT
RENTAL RFA GENERATOR (MISCELLANEOUS) IMPLANT
STRAP SAFETY 5IN WIDE (MISCELLANEOUS) ×2 IMPLANT
SWABSTK COMLB BENZOIN TINCTURE (MISCELLANEOUS) ×2 IMPLANT
TRAY KYPHOPAK 15/3 EXPRESS 1ST (MISCELLANEOUS) IMPLANT
TRAY KYPHOPAK 20/3 EXPRESS 1ST (MISCELLANEOUS) ×2 IMPLANT
WATER STERILE IRR 500ML POUR (IV SOLUTION) ×2 IMPLANT

## 2021-02-19 NOTE — Anesthesia Postprocedure Evaluation (Signed)
Anesthesia Post Note  Patient: Jeremaine Reineck  Procedure(s) Performed: [facility]  Patient location during evaluation: PACU Anesthesia Type: General Level of consciousness: awake and alert and oriented Pain management: pain level controlled Vital Signs Assessment: post-procedure vital signs reviewed and stable Respiratory status: spontaneous breathing, nonlabored ventilation and respiratory function stable Cardiovascular status: blood pressure returned to baseline and stable Postop Assessment: no signs of nausea or vomiting Anesthetic complications: no   No notable events documented.   Last Vitals:  Vitals:   02/19/21 1430 02/19/21 1450  BP: 134/80 (!) 152/84  Pulse: 68 72  Resp: 11 16  Temp: 36.5 C 36.5 C  SpO2: 98% 100%    Last Pain:  Vitals:   02/19/21 1430  TempSrc:   PainSc: 2                  Bridget Westbrooks

## 2021-02-19 NOTE — Progress Notes (Signed)
Initial Nutrition Assessment  DOCUMENTATION CODES:  Not applicable  INTERVENTION:  Recommend a regular diet after surgery to promote good oral intake. Ensure Enlive po 1x/d, each supplement provides 350 kcal and 20 grams of protein MVI with minerals daily.  NUTRITION DIAGNOSIS:  Increased nutrient needs related to post-op healing as evidenced by estimated needs.  GOAL:  Patient will meet greater than or equal to 90% of their needs  MONITOR:  PO intake, Supplement acceptance, Diet advancement, Skin  REASON FOR ASSESSMENT:  Consult Assessment of nutrition requirement/status  ASSESSMENT:  85 y.o. male with Hx of prostate cancer, HERD, HLD, HTN, hx MI s/p CABG x2 presented to ED from home with hip pain after a fall 1 week prior. Admitted with multiple lumbar fractures.  Pt reports he has not been able to walk at all since the time of his fall due to pain. Also reports weakness and poor PO intake for the last 5 days. Has been mostly bed bound. Reports he has not had help at home recently due to his wife begin hospitalized. Imaging in ED showed a chronic (but worsened) L1 fracture and acute fractures to the L2 and L3 vertebrae.  Neurosurgery consulted and plans for surgery today (9/9)  Pt resting in bed at the time of visit, hard of hearing, but woke to gentle touch. Wife also in room, provided some hx. Pt states he has a good appetite at baseline, looking forward to eating after his surgery. Denies any changes PTA - noted that on admission poor intake was noted for several days PTA due to weakness.   Hx of stated weights noted. Weight loss is seen over the last 6 months (6.2%) but unsure of accuracy. Bed weight reads 93.4 kg, which is pt's usual weight. Mild deficits noted on exam, but this is likely the result of normal muscle atrophy of aging versus malnutrition.   Discussed increased needs after surgery. Pt not familiar with nutrition supplements, but agreeable to them after surgery  today. Wife requests a yogurt be added to pt's breakfast trays in the morning and to not send any sugar packets as they do not add it to foods or drinks.  Average Meal Intake: 9/7-9/9: 88% intake x 2 recorded meals  Nutritionally Relevant Medications: Scheduled Meds:  folic acid  1 mg Oral Daily   linezolid  600 mg Oral BID   pantoprazole  40 mg Oral Daily   pravastatin  80 mg Oral Daily   cyanocobalamin  1,000 mcg Oral Daily   Continuous Infusions:  sodium chloride 50 mL/hr at 02/18/21 2140    ceFAZolin (ANCEF) IV     PRN Meds: ondansetron  Labs Reviewed: Na 126, chloride 88 K 2.0  NUTRITION - FOCUSED PHYSICAL EXAM: Flowsheet Row Most Recent Value  Orbital Region Mild depletion  Upper Arm Region No depletion  Thoracic and Lumbar Region Mild depletion  Buccal Region Mild depletion  Temple Region No depletion  Clavicle Bone Region Mild depletion  Clavicle and Acromion Bone Region Mild depletion  Scapular Bone Region Moderate depletion  Dorsal Hand No depletion  Patellar Region No depletion  Anterior Thigh Region No depletion  Posterior Calf Region Mild depletion  Edema (RD Assessment) Mild  Hair Reviewed  Eyes Reviewed  Mouth Reviewed  [dry crusty skin surrounding lips]  Skin Reviewed  Nails Reviewed   Diet Order:   Diet Order             Diet NPO time specified  Diet effective now  EDUCATION NEEDS:  No education needs have been identified at this time  Skin:  Skin Assessment: Reviewed RN Assessment (abrasions to the chest and buttocks, ecchymosis throughout body, rash to the scrotum)  Last BM:  unsure  Height:  Ht Readings from Last 1 Encounters:  02/17/21 6' (1.829 m)    Weight:  Wt Readings from Last 1 Encounters:  02/19/21 93 kg    Ideal Body Weight:  80.9 kg  BMI:  Body mass index is 27.81 kg/m.  Estimated Nutritional Needs:  Kcal:  2000-2100 kcal/d Protein:  100-110 g/d Fluid:  2-2.2 L/d   Ranell Patrick, RD,  LDN Clinical Dietitian Pager on [place]

## 2021-02-19 NOTE — Consult Note (Addendum)
Reason for Consult: Multiple compression fractures lumbar spine Referring Physician: Dr. Hanley Hays is an 85 y.o. male.  HPI: Patient is a 85 year old who suffered a fall and July and has been having persistent pain such that he came to the emergency room on September 7 with severe back pain and was found to have L2 and L3 compression fractures as well as an old L1 fracture.  His difficulty walking secondary to this and unable to care for himself.  He denies radicular symptoms or severe leg pain he does have peripheral neuropathy with loss of sensation in the lower extremities.  He also suffers from extensive osteoarthritis.  Past Medical History:  Diagnosis Date   Arthritis    Benign essential tremor    Fracture, femur ([place]) 2012   GERD (gastroesophageal reflux disease)    H/O echocardiogram    05/2013   Hx of colonoscopy    04/22/2013   Hypercholesterolemia    Hypertension    Myocardial infarction [facility])    1987   Polyneuropathy    Prostate cancer ([place]) 2004   Superficial hematoma     Past Surgical History:  Procedure Laterality Date   APPLICATION OF WOUND VAC Left 08/12/2020   Procedure: APPLICATION OF WOUND VAC;  Surgeon: Samara Deist, DPM;  Location: [facility] ORS;  Service: Podiatry;  Laterality: Left;   BASAL CELL CARCINOMA EXCISION     COLONOSCOPY  05/01/2009   Positive for colonic polyps   COLONOSCOPY WITH PROPOFOL N/A 02/24/2016   Procedure: COLONOSCOPY WITH PROPOFOL;  Surgeon: Christene Lye, MD;  Location: [facility] ENDOSCOPY;  Service: Endoscopy;  Laterality: N/A;   CORONARY ARTERY BYPASS GRAFT  1987   [place]   PROSTATE SURGERY  2004   TEE WITHOUT CARDIOVERSION N/A 08/13/2020   Procedure: TRANSESOPHAGEAL ECHOCARDIOGRAM (TEE);  Surgeon: Corey Skains, MD;  Location: [facility] ORS;  Service: Cardiovascular;  Laterality: N/A;   WOUND DEBRIDEMENT Left 08/12/2020   Procedure: DEBRIDEMENT WOUND-Left Heel;  Surgeon: Samara Deist, DPM;  Location: [facility]  ORS;  Service: Podiatry;  Laterality: Left;    Family History  Problem Relation Age of Onset   Heart disease Mother    Congestive Heart Failure Father     Social History:  reports that he has never smoked. He has never used smokeless tobacco. He reports current alcohol use of about 6.0 standard drinks per week. He reports that he does not use drugs.  Allergies:  Allergies  Allergen Reactions   Codeine Nausea And Vomiting   Indocin [Indomethacin] Other (See Comments)   Latex Other (See Comments)    Blisters   Mupirocin     Blisters   Plavix [Clopidogrel Bisulfate]     GI intolerance, n/v   Polysporin [Bacitracin-Polymyxin B]     Blisters   Shellfish Allergy    Tape    Neosporin [Neomycin-Bacitracin Zn-Polymyx] Rash   Other Nausea And Vomiting    mussels    Medications: I have reviewed the patient's current medications.  Results for orders placed or performed during the hospital encounter of 02/17/21 (from the past 48 hour(s))  Sample to Blood Bank     Status: None   Collection Time: 02/17/21 12:00 PM  Result Value Ref Range   Blood Bank Specimen SAMPLE AVAILABLE FOR TESTING    Sample Expiration      02/20/2021,2359 Performed at [facility], [address], [place],  [address]   Comprehensive metabolic panel     Status:  Abnormal   Collection Time: 02/17/21 12:47 PM  Result Value Ref Range   Sodium 126 (L) 135 - 145 mmol/L   Potassium 2.9 (L) 3.5 - 5.1 mmol/L   Chloride 85 (L) 98 - 111 mmol/L   CO2 30 22 - 32 mmol/L   Glucose, Bld 119 (H) 70 - 99 mg/dL    Comment: Glucose reference range applies only to samples taken after fasting for at least 8 hours.   BUN 11 8 - 23 mg/dL   Creatinine, Ser 0.75 0.61 - 1.24 mg/dL   Calcium 10.0 8.9 - 10.3 mg/dL   Total Protein 7.2 6.5 - 8.1 g/dL   Albumin 4.0 3.5 - 5.0 g/dL   AST 15 15 - 41 U/L   ALT 15 0 - 44 U/L   Alkaline Phosphatase 67 38 - 126 U/L   Total Bilirubin 0.5 0.3 - 1.2 mg/dL   GFR, Estimated  >60 >60 mL/min    Comment: (NOTE) Calculated using the CKD-EPI Creatinine Equation (2021)    Anion gap 11 5 - 15    Comment: Performed at [facility], [address] [place],  [address]  CBC with Differential     Status: Abnormal   Collection Time: 02/17/21 12:47 PM  Result Value Ref Range   WBC 3.8 (L) 4.0 - 10.5 K/uL   RBC RESULTS UNAVAILABLE DUE TO INTERFERING SUBSTANCE 4.22 - 5.81 MIL/uL   Hemoglobin 10.8 (L) 13.0 - 17.0 g/dL   HCT RESULTS UNAVAILABLE DUE TO INTERFERING SUBSTANCE 39.0 - 52.0 %   MCV RESULTS UNAVAILABLE DUE TO INTERFERING SUBSTANCE 80.0 - 100.0 fL   MCH RESULTS UNAVAILABLE DUE TO INTERFERING SUBSTANCE 26.0 - 34.0 pg   MCHC RESULTS UNAVAILABLE DUE TO INTERFERING SUBSTANCE 30.0 - 36.0 g/dL   RDW RESULTS UNAVAILABLE DUE TO INTERFERING SUBSTANCE 11.5 - 15.5 %   Platelets 142 (L) 150 - 400 K/uL   nRBC RESULTS UNAVAILABLE DUE TO INTERFERING SUBSTANCE 0.0 - 0.2 %    Comment: 0.0   Neutrophils Relative % 52 %   Neutro Abs 2.0 1.7 - 7.7 K/uL   Lymphocytes Relative 23 %   Lymphs Abs 0.9 0.7 - 4.0 K/uL   Monocytes Relative 21 %   Monocytes Absolute 0.8 0.1 - 1.0 K/uL   Eosinophils Relative 2 %   Eosinophils Absolute 0.1 0.0 - 0.5 K/uL   Basophils Relative 1 %   Basophils Absolute 0.0 0.0 - 0.1 K/uL   WBC Morphology MORPHOLOGY UNREMARKABLE    RBC Morphology MORPHOLOGY UNREMARKABLE    Smear Review Normal platelet morphology    Immature Granulocytes 1 %   Abs Immature Granulocytes 0.02 0.00 - 0.07 K/uL    Comment: Performed at [facility], [address] [place], [place] [address]  SARS CORONAVIRUS 2 (TAT 6-24 HRS) Nasopharyngeal Nasopharyngeal Swab     Status: None   Collection Time: 02/17/21 12:48 PM   Specimen: Nasopharyngeal Swab  Result Value Ref Range   SARS Coronavirus 2 NEGATIVE NEGATIVE    Comment: (NOTE) SARS-CoV-2 target nucleic acids are NOT DETECTED.  The SARS-CoV-2 RNA is generally detectable in upper and  lower respiratory specimens during the acute phase of infection. Negative results do not preclude SARS-CoV-2 infection, do not rule out co-infections with other pathogens, and should not be used as the sole basis for treatment or other patient management decisions. Negative results must be combined with clinical observations, patient history, and epidemiological information. The expected result is Negative.  Fact Sheet for Patients: SugarRoll.be  Fact Sheet for Healthcare Providers: https://www.woods-mathews.com/  This test is not yet approved or cleared by the [place] FDA and  has been authorized for detection and/or diagnosis of SARS-CoV-2 by FDA under an Emergency Use Authorization (EUA). This EUA will remain  in effect (meaning this test can be used) for the duration of the COVID-19 declaration under Se ction 564(b)(1) of the Act, 21 U.S.C. section 360bbb-3(b)(1), unless the authorization is terminated or revoked sooner.  Performed at [facility], [place] [address], [place], [place] [address]   Urinalysis, Complete w Microscopic Urine, Clean Catch     Status: Abnormal   Collection Time: 02/17/21  5:10 PM  Result Value Ref Range   Color, Urine YELLOW (A) YELLOW   APPearance CLEAR (A) CLEAR   Specific Gravity, Urine 1.015 1.005 - 1.030   pH 7.5 5.0 - 8.0   Glucose, UA NEGATIVE NEGATIVE mg/dL   Hgb urine dipstick TRACE (A) NEGATIVE   Bilirubin Urine NEGATIVE NEGATIVE   Ketones, ur NEGATIVE NEGATIVE mg/dL   Protein, ur NEGATIVE NEGATIVE mg/dL   Nitrite NEGATIVE NEGATIVE   Leukocytes,Ua NEGATIVE NEGATIVE   RBC / HPF 0-5 0 - 5 RBC/hpf   WBC, UA 0-5 0 - 5 WBC/hpf   Bacteria, UA NONE SEEN NONE SEEN   Squamous Epithelial / LPF NONE SEEN 0 - 5   Mucus PRESENT     Comment: Performed at [facility], [address], [place], [place] [id number]  Basic metabolic panel     Status: Abnormal   Collection Time: 02/18/21  7:00  AM  Result Value Ref Range   Sodium 126 (L) 135 - 145 mmol/L   Potassium 2.9 (L) 3.5 - 5.1 mmol/L   Chloride 88 (L) 98 - 111 mmol/L   CO2 27 22 - 32 mmol/L   Glucose, Bld 91 70 - 99 mg/dL    Comment: Glucose reference range applies only to samples taken after fasting for at least 8 hours.   BUN 9 8 - 23 mg/dL   Creatinine, Ser 0.76 0.61 - 1.24 mg/dL   Calcium 9.4 8.9 - 10.3 mg/dL   GFR, Estimated >60 >60 mL/min    Comment: (NOTE) Calculated using the CKD-EPI Creatinine Equation (2021)    Anion gap 11 5 - 15    Comment: Performed at [facility], [address], [place], [place] [address]    DG Lumbar Spine 2-3 Views  Result Date: 02/17/2021 CLINICAL DATA:  Pain for 2 weeks after fall. Slight out of wheelchair. EXAM: LUMBAR SPINE - 2-3 VIEW COMPARISON:  None available. FINDINGS: Please note patient has transitional lumbosacral anatomy with suspected lumbarization of S1. The uppermost non-rib-bearing vertebra will be labeled L1. Severe L1 compression fracture with near complete loss of height centrally. There is slight buckling of the posterior cortex with focal kyphosis at this level. Moderate L3 compression fracture with approximately 25% loss of height. There is minimal cortical buckling. The bones are diffusely under mineralized. Multilevel degenerative disc disease most prominent at L4-L5 and L5-S1. Facet hypertrophy from L3-L4 through the lumbosacral junction. IMPRESSION: 1. Severe L1 and moderate L3 compression fractures, age indeterminate. 2. Multilevel degenerative change throughout the lumbar spine. 3. 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 The upper most lumbar vertebra is labeled L1 for numbering purposes. Electronically Signed   By: Keith Rake M.D.   On: 02/17/2021 17:28   MR LUMBAR SPINE WO CONTRAST  Result Date: 02/17/2021 CLINICAL DATA:  Initial evaluation for acute compression fracture. EXAM: MRI LUMBAR SPINE  WITHOUT CONTRAST TECHNIQUE: Multiplanar, multisequence MR imaging of the lumbar spine was performed. No intravenous contrast was administered. COMPARISON:  Radiograph from earlier the same day. FINDINGS: Segmentation: Transitional lumbosacral anatomy with a partially lumbarized S1 segment and rudimentary S1-2 interspace. Alignment: Mild levoscoliosis. Alignment otherwise normal with preservation of the normal lumbar lordosis. Vertebrae: There is an acute compression fracture extending through the inferior endplate of L2. Associated height loss is relatively mild measuring approximately 15% without significant bony retropulsion. There is an additional acute compression fracture involving the superior endplate of L3. Associated height loss measures up to 20% without significant bony retropulsion. These are benign/mechanical in appearance. Severe chronic compression deformity with near complete height loss and vertebral plana formation noted at L1, chronic in appearance without significant residual marrow edema. Associated up to 5 mm bony retropulsion at the posterior/inferior aspect of the collapsed L1 vertebral body. Otherwise, vertebral body height maintained. Underlying bone marrow signal intensity is diffusely heterogeneous, likely reflecting osteoporosis/osteopenia. No worrisome osseous lesions. Additional mild discogenic reactive endplate change noted about the right aspect of the L4-5 interspace. No other abnormal marrow edema. Conus medullaris and cauda equina: Conus extends to the L1 level. Visualized conus medullaris is within normal limits. Nerve roots of the cauda equina are diffusely irregular and undulating related to underlying spinal stenosis, largely due to prominent epidural lipomatosis. Paraspinal and other soft tissues: Mild edema within the psoas musculature bilaterally adjacent to the acute compression fractures, right slightly worse than left. Additional mild edema within the lower posterior  paraspinous musculature, suggesting associated muscular injury/strain. Underlying chronic atrophic changes noted throughout the visualized musculature. Few tiny simple cysts noted within the partially visualized right kidney. Visualized kidneys themselves are somewhat atrophic. Disc levels: T12-L1: Mild disc bulge with disc desiccation. Mild facet hypertrophy. No significant spinal stenosis. Foramina remain patent. L1-2: Up to 5 mm bony retropulsion related to the chronic L1 compression fracture. Superimposed disc bulge with disc desiccation with mild to moderate facet hypertrophy. Prominence of the dorsal epidural fat. Resultant mild-to-moderate spinal stenosis. Mild-to-moderate bilateral L1 foraminal narrowing. L2-3: Disc bulge with disc desiccation. Mild-to-moderate bilateral facet hypertrophy. Prominent epidural lipomatosis with resultant moderate spinal stenosis. Moderate right with mild left L2 foraminal narrowing. L3-4: Degenerative intervertebral disc space narrowing with mild diffuse disc bulge. Associated reactive endplate spurring. Moderate facet and ligament flavum hypertrophy. Epidural lipomatosis. Resultant moderate to severe spinal stenosis, with the thecal sac measuring 5 mm in AP diameter, largely due to epidural lipomatosis. Mild left with moderate right L3 foraminal narrowing. L4-5: Degenerative intervertebral disc space narrowing with diffuse disc bulge and reactive endplate change. Superimposed left extraforaminal disc protrusion contacts the exiting left L4 nerve root (series 9, image 25). Moderate facet hypertrophy. Circumferential epidural lipomatosis. Resultant moderate spinal stenosis, largely due to epidural fat. Mild right with moderate left L4 foraminal narrowing. L5-S1: Degenerative intervertebral disc space narrowing with diffuse disc bulge, disc desiccation, and reactive endplate spurring. Moderate facet hypertrophy. Mild epidural lipomatosis. No significant spinal stenosis. Moderate  left worse than right L5 foraminal narrowing. S1-2: Transitional lumbosacral anatomy with rudimentary S1-2 interspace. No disc bulge or focal disc herniation. Mild to moderate facet hypertrophy. No spinal stenosis. Foramina remain patent. IMPRESSION: 1. Acute compression fractures involving the L2 and L3 vertebral bodies with up to 15-20% height loss as above, but no significant bony retropulsion. These are benign/mechanical in appearance. 2. Diffuse edema within the psoas and posterior paraspinous musculature, suggesting a degree  of associated muscular injury/strain. 3. Severe chronic compression deformity at L1 with near complete height loss. 4. Multilevel spondylosis with diffuse epidural lipomatosis throughout the lumbar spine, resulting in moderate to severe diffuse spinal stenosis, most pronounced at L3-4. Electronically Signed   By: Jeannine Boga M.D.   On: 02/17/2021 23:31   DG Hand 2 View Right  Result Date: 02/18/2021 CLINICAL DATA:  Wound at nailbed of right middle finger. Cellulitis. EXAM: RIGHT HAND - 2 VIEW COMPARISON:  None. FINDINGS: Advanced degenerative changes in the IP joints, most pronounced in the DIP joints as well as the 1st carpometacarpal joint. No acute bony abnormality. Specifically, no fracture, subluxation, or dislocation. No bone destruction to suggest osteomyelitis. Small radiopaque foreign body within the soft tissues in the right index finger. No soft tissue gas. IMPRESSION: No acute bony abnormality.  No evidence of osteomyelitis. Electronically Signed   By: Rolm Baptise M.D.   On: 02/18/2021 00:56   CT Hip Right Wo Contrast  Result Date: 02/17/2021 CLINICAL DATA:  Right hip pain but negative conventional radiographic workup. EXAM: CT OF THE RIGHT HIP WITHOUT CONTRAST TECHNIQUE: Multidetector CT imaging of the right hip was performed according to the standard protocol. Multiplanar CT image reconstructions were also generated. COMPARISON:  Radiographs 02/17/2021  FINDINGS: Bones/Joint/Cartilage No acute fracture is identified. There is spurring of the acetabulum, femoral head, greater trochanter, and along portions of the femoral neck most compatible with degenerative findings. I do not see a well-defined fracture plane. MRI can be more sensitive in detecting stress injuries of bony structures if head degree of suspicion persists or if the patient is unable to bear weight. No hip joint effusion Ligaments Suboptimally assessed by CT. Muscles and Tendons No significant regional muscular hematoma. Expected contour of the hamstring tendon. No abnormal stranding within or along the musculature. Soft tissues Arterial atherosclerosis noted. Prostatectomy. High density along the right groin skin crease likely from talcum patter or similar. No right sciatic impingement identified. Mild subcutaneous edema lateral to the greater trochanter may represent some regional bruising. IMPRESSION: 1. Potential bruising in the subcutaneous tissues overlying the greater trochanter, but no visible fracture. Degenerative findings in the hip noted. MRI does have a higher sensitivity for subtle fractures and stress fractures, if there continues to be high clinical suspicion. 2. Atherosclerosis. 3. Prostatectomy. Electronically Signed   By: Van Clines M.D.   On: 02/17/2021 14:40   DG Hip Unilat  With Pelvis 2-3 Views Right  Result Date: 02/17/2021 CLINICAL DATA:  Fall, right hip pain EXAM: DG HIP (WITH OR WITHOUT PELVIS) 2-3V RIGHT COMPARISON:  02/03/2021 FINDINGS: Osteopenia. There is no evidence of hip fracture or dislocation. There is no evidence of arthropathy or other focal bone abnormality. Surgical clips about the low pelvis. IMPRESSION: Osteopenia. No displaced fracture or dislocation of the right hip. Joint space is preserved. Please note that plain radiographs are significantly insensitive for hip and pelvic fracture. Recommend CT or MRI to most sensitively evaluate if there is  high clinical suspicion for fracture. Electronically Signed   By: Eddie Candle M.D.   On: 02/17/2021 13:48    Review of Systems Blood pressure 108/60, pulse 73, temperature 98.2 F (36.8 C), resp. rate 18, height 6' (1.829 m), weight 85.8 kg, SpO2 96 %. Physical Exam He is tender to palpation at L2 and L3.  Skin is intact.  Exam of flex extend the toes but has diminished sensation that he reports is chronic.  No clonus. Assessment/Plan: Severe pain secondary  to L2 and L3 compression fractures with extensive edema in both as well as significant underlying lumbar stenosis.  His symptoms seem to be all referable to his fractures. Plan is for L2 and L3 kyphoplasty later today.  I did discuss that if he has worsening leg pain later epidural steroid could be performed as an outpatient. Bone model and brochure regarding the kyphoplasty procedure reviewed with the patient  Hessie Knows 02/19/2021, 8:20 AM

## 2021-02-19 NOTE — Plan of Care (Signed)
No acute events during the night. NAD noted. VSS. NPO since MN pending Khyphoplasty. Surgical bath given x 1.  Problem: Education: Goal: Knowledge of General Education information will improve Description: Including pain rating scale, medication(s)/side effects and non-pharmacologic comfort measures Outcome: Progressing   Problem: Health Behavior/Discharge Planning: Goal: Ability to manage health-related needs will improve Outcome: Progressing   Problem: Clinical Measurements: Goal: Ability to maintain clinical measurements within normal limits will improve Outcome: Progressing Goal: Will remain free from infection Outcome: Progressing Goal: Diagnostic test results will improve Outcome: Progressing Goal: Respiratory complications will improve Outcome: Progressing Goal: Cardiovascular complication will be avoided Outcome: Progressing   Problem: Activity: Goal: Risk for activity intolerance will decrease Outcome: Progressing   Problem: Nutrition: Goal: Adequate nutrition will be maintained Outcome: Progressing   Problem: Coping: Goal: Level of anxiety will decrease Outcome: Progressing   Problem: Elimination: Goal: Will not experience complications related to bowel motility Outcome: Progressing Goal: Will not experience complications related to urinary retention Outcome: Progressing   Problem: Pain Managment: Goal: General experience of comfort will improve Outcome: Progressing   Problem: Safety: Goal: Ability to remain free from injury will improve Outcome: Progressing   Problem: Skin Integrity: Goal: Risk for impaired skin integrity will decrease Outcome: Progressing   Problem: Education: Goal: Ability to describe self-care measures that may prevent or decrease complications (Diabetes Survival Skills Education) will improve Outcome: Progressing Goal: Individualized Educational Video(s) Outcome: Progressing   Problem: Coping: Goal: Ability to adjust to  condition or change in health will improve Outcome: Progressing   Problem: Fluid Volume: Goal: Ability to maintain a balanced intake and output will improve Outcome: Progressing   Problem: Health Behavior/Discharge Planning: Goal: Ability to identify and utilize available resources and services will improve Outcome: Progressing Goal: Ability to manage health-related needs will improve Outcome: Progressing   Problem: Metabolic: Goal: Ability to maintain appropriate glucose levels will improve Outcome: Progressing   Problem: Nutritional: Goal: Maintenance of adequate nutrition will improve Outcome: Progressing Goal: Progress toward achieving an optimal weight will improve Outcome: Progressing   Problem: Skin Integrity: Goal: Risk for impaired skin integrity will decrease Outcome: Progressing   Problem: Tissue Perfusion: Goal: Adequacy of tissue perfusion will improve Outcome: Progressing

## 2021-02-19 NOTE — Progress Notes (Signed)
PROGRESS NOTE    [name]  [id number] DOB: 04-11-1936 DOA: 02/17/2021 PCP: [name], DO   Brief Narrative:  85 y.o. male with medical history significant for remote history of prostate cancer s/p prostatectomy, hypertension, CAD, chronic systolic heart failure, s/p left great toe MTP amputation, hx of MRSA bacteremia who presents with worsening back pain following a recent fall.   Patient had MRSA bacteremia and left foot infection in February requiring IV daptomycin for 2 weeks followed by p.o. linezolid.  He then later had lumbar soft tissue infection with I&D on 01/28/2021 he was treated with p.o. linezolid and p.o. rifampin.  Since then patient has been wheelchair-bound.  States about 2-1/2 weeks ago he fell out of his wheelchair landing on his right hip since it was not locked.  Has been experiencing severe back pain around lumbar spine.  Not taking anything for pain at home.  His wife was recently admitted here for a hip fracture and since then he has been laying at home with no one to help him get out of bed in the past few days.  Has not had anything to drink.  He finally called his doctor who sent EMS to bring him to the ED.  He endorsed some numbness and tingling of the right anterior thigh.  Denies any saddle anesthesia.  No urinary or bowel incontinent.  On presentation MRI imaging survey demonstrated lumbar vertebral disc disease associated with compression fracture of L2 and L3 vertebra.  Orthopedics was apparently made aware by EDP.  Pending consultation and recommendations regarding potential kyphoplasty   Assessment & Plan:   Principal Problem:   Compression fracture of first lumbar vertebra (HCC) Active Problems:   Essential hypertension   Chronic systolic CHF (congestive heart failure), NYHA class 2 (HCC)   Hyponatremia   Cellulitis   Lumbar compression fracture (HCC)  Acute compression fracture of the L2 and L3 vertebral body with height loss Severe  chronic compression deformity at L1 Dr. Rudene Christians consulted Plan: Plan for L2-L3 kyphoplasty today Continue n.p.o. and as needed pain control Therapy evaluations postoperatively    Hyponatremia Unclear chronicity.  Improving with IV isotonic fluids Plan: Continue maintenance fluids for now while n.p.o.   Paronychia/cellulitis of the third digit of the right hand recent MRSA back abscess Patient does not recall mechanism of injury.  Wound was wrapped up by neighbor. Right hand x-ray negative for osteomyelitis Plan: Continue PTA Zyvox and rifampin for now Will reach out to ID for further recommendations   recent MRSA back abscess -continue on linezolid and rifampin -follows with  [name] of ID   Chronic systolic heart failure - Patient appears hypovolemic on exam.  Hold Lasix while receiving fluid - Last EF of 55 to 60%   HTN -continue amlodipine, losartan   Hyperlipidemia - continue pravastatin    DVT prophylaxis: SCD Code Status: Full Family Communication: None today Disposition Plan: Status is: Inpatient  Remains inpatient appropriate because:Inpatient level of care appropriate due to severity of illness  Dispo: The patient is from: Home              Anticipated d/c is to: SNF              Patient currently is not medically stable to d/c.   Difficult to place patient No             Level of care: Med-Surg  Consultants:  Orthopedics  Procedures:  None  Antimicrobials: Rifampin Zyvox  Subjective: Seen and examined.  History limited by hearing loss and likely underlying dementia.  No visible distress.  Complains of back pain  Objective: Vitals:   02/18/21 2011 02/19/21 0545 02/19/21 0749 02/19/21 0908  BP: 135/84 111/77 108/60   Pulse: 88 79 73   Resp: '16 16 18   '$ Temp: 98.2 F (36.8 C) 98.4 F (36.9 C) 98.2 F (36.8 C)   TempSrc: Oral Oral    SpO2: 100%  96%   Weight:    93 kg  Height:        Intake/Output Summary (Last 24  hours) at 02/19/2021 1111 Last data filed at 02/19/2021 0600 Gross per 24 hour  Intake 932.5 ml  Output --  Net 932.5 ml   Filed Weights   02/17/21 1235 02/19/21 0908  Weight: 85.8 kg 93 kg    Examination:  General exam: No acute distress.  Frail Respiratory system: Lungs clear.  Normal work of breathing.  Room air Cardiovascular system: S1-S2, RRR, no murmurs, no pedal edema Gastrointestinal system: Thin, soft, NT/ND, normal bowel sounds  Central nervous system: Alert to person and place only.  No focal deficits Extremities: Decreased power bilateral lower extremities, decreased range of motion lower extremities Skin: No rashes, lesions or ulcers Psychiatry: Judgement and insight appear impaired. Mood & affect flattened.     Data Reviewed: I have personally reviewed following labs and imaging studies  CBC: Recent Labs  Lab 02/17/21 1247 02/19/21 0814  WBC 3.8* 4.8  NEUTROABS 2.0 2.4  HGB 10.8* 9.3*  HCT RESULTS UNAVAILABLE DUE TO INTERFERING SUBSTANCE 25.1*  MCV RESULTS UNAVAILABLE DUE TO INTERFERING SUBSTANCE 101.6*  PLT 142* 0000000   Basic Metabolic Panel: Recent Labs  Lab 02/17/21 1247 02/18/21 0700 02/19/21 0814  NA 126* 126* 128*  K 2.9* 2.9* 3.5  CL 85* 88* 94*  CO2 '30 27 26  '$ GLUCOSE 119* 91 106*  BUN '11 9 11  '$ CREATININE 0.75 0.76 0.85  CALCIUM 10.0 9.4 9.5  MG  --   --  1.7   GFR: Estimated Creatinine Clearance: 72.3 mL/min (by C-G formula based on SCr of 0.85 mg/dL). Liver Function Tests: Recent Labs  Lab 02/17/21 1247  AST 15  ALT 15  ALKPHOS 67  BILITOT 0.5  PROT 7.2  ALBUMIN 4.0   No results for input(s): LIPASE, AMYLASE in the last 168 hours. No results for input(s): AMMONIA in the last 168 hours. Coagulation Profile: No results for input(s): INR, PROTIME in the last 168 hours. Cardiac Enzymes: No results for input(s): CKTOTAL, CKMB, CKMBINDEX, TROPONINI in the last 168 hours. BNP (last 3 results) No results for input(s): PROBNP in the  last 8760 hours. HbA1C: No results for input(s): HGBA1C in the last 72 hours. CBG: No results for input(s): GLUCAP in the last 168 hours. Lipid Profile: No results for input(s): CHOL, HDL, LDLCALC, TRIG, CHOLHDL, LDLDIRECT in the last 72 hours. Thyroid Function Tests: No results for input(s): TSH, T4TOTAL, FREET4, T3FREE, THYROIDAB in the last 72 hours. Anemia Panel: No results for input(s): VITAMINB12, FOLATE, FERRITIN, TIBC, IRON, RETICCTPCT in the last 72 hours. Sepsis Labs: No results for input(s): PROCALCITON, LATICACIDVEN in the last 168 hours.  Recent Results (from the past 240 hour(s))  SARS CORONAVIRUS 2 (TAT 6-24 HRS) Nasopharyngeal Nasopharyngeal Swab     Status: None   Collection Time: 02/17/21 12:48 PM   Specimen: Nasopharyngeal Swab  Result Value Ref Range Status   SARS Coronavirus 2 NEGATIVE NEGATIVE Final    Comment: (NOTE)  SARS-CoV-2 target nucleic acids are NOT DETECTED.  The SARS-CoV-2 RNA is generally detectable in upper and lower respiratory specimens during the acute phase of infection. Negative results do not preclude SARS-CoV-2 infection, do not rule out co-infections with other pathogens, and should not be used as the sole basis for treatment or other patient management decisions. Negative results must be combined with clinical observations, patient history, and epidemiological information. The expected result is Negative.  Fact Sheet for Patients: SugarRoll.be  Fact Sheet for Healthcare Providers: https://www.woods-mathews.com/  This test is not yet approved or cleared by the [place] FDA and  has been authorized for detection and/or diagnosis of SARS-CoV-2 by FDA under an Emergency Use Authorization (EUA). This EUA will remain  in effect (meaning this test can be used) for the duration of the COVID-19 declaration under Se ction 564(b)(1) of the Act, 21 U.S.C. section 360bbb-3(b)(1), unless the  authorization is terminated or revoked sooner.  Performed at [facility], [place] [address], [place], [place] [address]          Radiology Studies: DG Lumbar Spine 2-3 Views  Result Date: 02/17/2021 CLINICAL DATA:  Pain for 2 weeks after fall. Slight out of wheelchair. EXAM: LUMBAR SPINE - 2-3 VIEW COMPARISON:  None available. FINDINGS: Please note patient has transitional lumbosacral anatomy with suspected lumbarization of S1. The uppermost non-rib-bearing vertebra will be labeled L1. Severe L1 compression fracture with near complete loss of height centrally. There is slight buckling of the posterior cortex with focal kyphosis at this level. Moderate L3 compression fracture with approximately 25% loss of height. There is minimal cortical buckling. The bones are diffusely under mineralized. Multilevel degenerative disc disease most prominent at L4-L5 and L5-S1. Facet hypertrophy from L3-L4 through the lumbosacral junction. IMPRESSION: 1. Severe L1 and moderate L3 compression fractures, age indeterminate. 2. Multilevel degenerative change throughout the lumbar spine. 3. 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 The upper most lumbar vertebra is labeled L1 for numbering purposes. Electronically Signed   By: Keith Rake M.D.   On: 02/17/2021 17:28   MR LUMBAR SPINE WO CONTRAST  Result Date: 02/17/2021 CLINICAL DATA:  Initial evaluation for acute compression fracture. EXAM: MRI LUMBAR SPINE WITHOUT CONTRAST TECHNIQUE: Multiplanar, multisequence MR imaging of the lumbar spine was performed. No intravenous contrast was administered. COMPARISON:  Radiograph from earlier the same day. FINDINGS: Segmentation: Transitional lumbosacral anatomy with a partially lumbarized S1 segment and rudimentary S1-2 interspace. Alignment: Mild levoscoliosis. Alignment otherwise normal with preservation of the normal lumbar lordosis. Vertebrae: There is an acute  compression fracture extending through the inferior endplate of L2. Associated height loss is relatively mild measuring approximately 15% without significant bony retropulsion. There is an additional acute compression fracture involving the superior endplate of L3. Associated height loss measures up to 20% without significant bony retropulsion. These are benign/mechanical in appearance. Severe chronic compression deformity with near complete height loss and vertebral plana formation noted at L1, chronic in appearance without significant residual marrow edema. Associated up to 5 mm bony retropulsion at the posterior/inferior aspect of the collapsed L1 vertebral body. Otherwise, vertebral body height maintained. Underlying bone marrow signal intensity is diffusely heterogeneous, likely reflecting osteoporosis/osteopenia. No worrisome osseous lesions. Additional mild discogenic reactive endplate change noted about the right aspect of the L4-5 interspace. No other abnormal marrow edema. Conus medullaris and cauda equina: Conus extends to the L1 level. Visualized conus medullaris is within normal limits. Nerve roots of the cauda equina  are diffusely irregular and undulating related to underlying spinal stenosis, largely due to prominent epidural lipomatosis. Paraspinal and other soft tissues: Mild edema within the psoas musculature bilaterally adjacent to the acute compression fractures, right slightly worse than left. Additional mild edema within the lower posterior paraspinous musculature, suggesting associated muscular injury/strain. Underlying chronic atrophic changes noted throughout the visualized musculature. Few tiny simple cysts noted within the partially visualized right kidney. Visualized kidneys themselves are somewhat atrophic. Disc levels: T12-L1: Mild disc bulge with disc desiccation. Mild facet hypertrophy. No significant spinal stenosis. Foramina remain patent. L1-2: Up to 5 mm bony retropulsion related  to the chronic L1 compression fracture. Superimposed disc bulge with disc desiccation with mild to moderate facet hypertrophy. Prominence of the dorsal epidural fat. Resultant mild-to-moderate spinal stenosis. Mild-to-moderate bilateral L1 foraminal narrowing. L2-3: Disc bulge with disc desiccation. Mild-to-moderate bilateral facet hypertrophy. Prominent epidural lipomatosis with resultant moderate spinal stenosis. Moderate right with mild left L2 foraminal narrowing. L3-4: Degenerative intervertebral disc space narrowing with mild diffuse disc bulge. Associated reactive endplate spurring. Moderate facet and ligament flavum hypertrophy. Epidural lipomatosis. Resultant moderate to severe spinal stenosis, with the thecal sac measuring 5 mm in AP diameter, largely due to epidural lipomatosis. Mild left with moderate right L3 foraminal narrowing. L4-5: Degenerative intervertebral disc space narrowing with diffuse disc bulge and reactive endplate change. Superimposed left extraforaminal disc protrusion contacts the exiting left L4 nerve root (series 9, image 25). Moderate facet hypertrophy. Circumferential epidural lipomatosis. Resultant moderate spinal stenosis, largely due to epidural fat. Mild right with moderate left L4 foraminal narrowing. L5-S1: Degenerative intervertebral disc space narrowing with diffuse disc bulge, disc desiccation, and reactive endplate spurring. Moderate facet hypertrophy. Mild epidural lipomatosis. No significant spinal stenosis. Moderate left worse than right L5 foraminal narrowing. S1-2: Transitional lumbosacral anatomy with rudimentary S1-2 interspace. No disc bulge or focal disc herniation. Mild to moderate facet hypertrophy. No spinal stenosis. Foramina remain patent. IMPRESSION: 1. Acute compression fractures involving the L2 and L3 vertebral bodies with up to 15-20% height loss as above, but no significant bony retropulsion. These are benign/mechanical in appearance. 2. Diffuse edema  within the psoas and posterior paraspinous musculature, suggesting a degree of associated muscular injury/strain. 3. Severe chronic compression deformity at L1 with near complete height loss. 4. Multilevel spondylosis with diffuse epidural lipomatosis throughout the lumbar spine, resulting in moderate to severe diffuse spinal stenosis, most pronounced at L3-4. Electronically Signed   By: Jeannine Boga M.D.   On: 02/17/2021 23:31   DG Hand 2 View Right  Result Date: 02/18/2021 CLINICAL DATA:  Wound at nailbed of right middle finger. Cellulitis. EXAM: RIGHT HAND - 2 VIEW COMPARISON:  None. FINDINGS: Advanced degenerative changes in the IP joints, most pronounced in the DIP joints as well as the 1st carpometacarpal joint. No acute bony abnormality. Specifically, no fracture, subluxation, or dislocation. No bone destruction to suggest osteomyelitis. Small radiopaque foreign body within the soft tissues in the right index finger. No soft tissue gas. IMPRESSION: No acute bony abnormality.  No evidence of osteomyelitis. Electronically Signed   By: Rolm Baptise M.D.   On: 02/18/2021 00:56   CT Hip Right Wo Contrast  Result Date: 02/17/2021 CLINICAL DATA:  Right hip pain but negative conventional radiographic workup. EXAM: CT OF THE RIGHT HIP WITHOUT CONTRAST TECHNIQUE: Multidetector CT imaging of the right hip was performed according to the standard protocol. Multiplanar CT image reconstructions were also generated. COMPARISON:  Radiographs 02/17/2021 FINDINGS: Bones/Joint/Cartilage No acute fracture is identified. There  is spurring of the acetabulum, femoral head, greater trochanter, and along portions of the femoral neck most compatible with degenerative findings. I do not see a well-defined fracture plane. MRI can be more sensitive in detecting stress injuries of bony structures if head degree of suspicion persists or if the patient is unable to bear weight. No hip joint effusion Ligaments Suboptimally  assessed by CT. Muscles and Tendons No significant regional muscular hematoma. Expected contour of the hamstring tendon. No abnormal stranding within or along the musculature. Soft tissues Arterial atherosclerosis noted. Prostatectomy. High density along the right groin skin crease likely from talcum patter or similar. No right sciatic impingement identified. Mild subcutaneous edema lateral to the greater trochanter may represent some regional bruising. IMPRESSION: 1. Potential bruising in the subcutaneous tissues overlying the greater trochanter, but no visible fracture. Degenerative findings in the hip noted. MRI does have a higher sensitivity for subtle fractures and stress fractures, if there continues to be high clinical suspicion. 2. Atherosclerosis. 3. Prostatectomy. Electronically Signed   By: Van Clines M.D.   On: 02/17/2021 14:40   DG Hip Unilat  With Pelvis 2-3 Views Right  Result Date: 02/17/2021 CLINICAL DATA:  Fall, right hip pain EXAM: DG HIP (WITH OR WITHOUT PELVIS) 2-3V RIGHT COMPARISON:  02/03/2021 FINDINGS: Osteopenia. There is no evidence of hip fracture or dislocation. There is no evidence of arthropathy or other focal bone abnormality. Surgical clips about the low pelvis. IMPRESSION: Osteopenia. No displaced fracture or dislocation of the right hip. Joint space is preserved. Please note that plain radiographs are significantly insensitive for hip and pelvic fracture. Recommend CT or MRI to most sensitively evaluate if there is high clinical suspicion for fracture. Electronically Signed   By: Eddie Candle M.D.   On: 02/17/2021 13:48        Scheduled Meds:  amLODipine  2.5 mg Oral Daily   aspirin EC  162 mg Oral Daily   folic acid  1 mg Oral Daily   gabapentin  1,200 mg Oral BID   linezolid  600 mg Oral BID   losartan  50 mg Oral Daily   melatonin  5 mg Oral QHS   mupirocin ointment  1 application Nasal BID   pantoprazole  40 mg Oral Daily   pravastatin  80 mg Oral  Daily   primidone  50 mg Oral q morning   And   primidone  100 mg Oral QHS   rifampin  300 mg Oral BID   cyanocobalamin  1,000 mcg Oral Daily   Continuous Infusions:  sodium chloride 50 mL/hr at 02/18/21 2140    ceFAZolin (ANCEF) IV       LOS: 1 day    Time spent: 25 minutes    Sidney Ace, MD [facility] Pager [phone]  If 7PM-7AM, please contact night-coverage 02/19/2021, 11:11 AM

## 2021-02-19 NOTE — Transfer of Care (Signed)
Immediate Anesthesia Transfer of Care Note  Patient: [name]  Procedure(s) Performed: KYPHOPLASTY-L2&L3  Patient Location: PACU  Anesthesia Type:General  Level of Consciousness: awake, alert  and oriented  Airway & Oxygen Therapy: Patient Spontanous Breathing  Post-op Assessment: Report given to RN and Post -op Vital signs reviewed and stable  Post vital signs: Reviewed and stable  Last Vitals:  Vitals Value Taken Time  BP 114/67   Temp    Pulse 79 02/19/21 1406  Resp 10 02/19/21 1406  SpO2 98 % 02/19/21 1406  Vitals shown include unvalidated device data.  Last Pain:  Vitals:   02/19/21 1235  TempSrc: Oral  PainSc: 4          Complications: No notable events documented.

## 2021-02-19 NOTE — Op Note (Signed)
02/19/2021  2:12 PM  PATIENT:  [name]   MRN: [id number]   PRE-OPERATIVE DIAGNOSIS:  closed wedge compression fracture of L2 and L3   POST-OPERATIVE DIAGNOSIS:  closed wedge compression fracture of L2 and L3   PROCEDURE:  Procedure(s): KYPHOPLASTY L2 and L3  SURGEON: Laurene Footman, MD   ASSISTANTS: None   ANESTHESIA:   local and MAC   EBL:  No intake/output data recorded.   BLOOD ADMINISTERED:none   DRAINS: none    LOCAL MEDICATIONS USED:  MARCAINE    and XYLOCAINE    SPECIMEN: L2 and L3 vertebral body biopsy   DISPOSITION OF SPECIMEN: Pathology   COUNTS:  YES   TOURNIQUET:  * No tourniquets in log *   IMPLANTS: Bone cement   DICTATION: .Dragon Dictation  patient was brought to the operating room and after adequate anesthesia was obtained the patient was placed prone.  C arm was brought in in good visualization of the affected level obtained on both AP and lateral projections.  After patient identification and timeout procedures were completed, local anesthetic was infiltrated with 10 cc 1% Xylocaine infiltrated subcutaneously.  This is done the area on the each side of the planned approach.  The back was then prepped and draped in the usual sterile manner and repeat timeout procedure carried out.  A spinal needle was brought down to the pedicle on the right side of L2 and left side of L3 and a 50-50 mix of 1% Xylocaine half percent Sensorcaine with epinephrine total of 20 cc injected on each side.  After allowing this to set a small incision was made and the trocar was advanced into the vertebral body in an extrapedicular fashion.  Biopsy was obtained at each level drilling was carried out balloon inserted with inflation to 3 cc on the right at L2 and 2-1/2 cc on the left L3.  When the cement was appropriate consistency 4.5 cc were injected on the right at L2 and 5.0 cc on the left into the vertebral body at L3 without extravasation, good fill superior to inferior endplates  and from right to left sides along the inferior endplate.  After the cement had set the trochar was removed and permanent C-arm views obtained.  The wounds were closed with Dermabond followed by Band-Aid   PLAN OF CARE: Continue as inpatient   PATIENT DISPOSITION:  PACU - hemodynamically stable.

## 2021-02-19 NOTE — Anesthesia Procedure Notes (Signed)
Date/Time: 02/19/2021 1:27 PM Performed by: Lily Peer, Analaya Hoey, CRNA Pre-anesthesia Checklist: Patient identified, Emergency Drugs available, Suction available, Timeout performed and Patient being monitored Patient Re-evaluated:Patient Re-evaluated prior to induction Oxygen Delivery Method: Nasal cannula Induction Type: IV induction

## 2021-02-19 NOTE — Anesthesia Preprocedure Evaluation (Signed)
Anesthesia Evaluation  Patient identified by MRN, date of birth, ID band Patient awake    Reviewed: Allergy & Precautions, NPO status , Patient's Chart, lab work & pertinent test results  History of Anesthesia Complications Negative for: history of anesthetic complications  Airway Mallampati: II  TM Distance: >3 FB Neck ROM: Full    Dental  (+) Poor Dentition   Pulmonary neg pulmonary ROS, neg sleep apnea, neg COPD,    breath sounds clear to auscultation- rhonchi (-) wheezing      Cardiovascular hypertension, Pt. on medications + CAD, + Past MI and + CABG [id number])   Rhythm:Regular Rate:Normal - Systolic murmurs and - Diastolic murmurs Echo [id number]: 1. Left ventricular ejection fraction, by estimation, is 55 to 60%. The  left ventricle has normal function. The left ventricle has no regional  wall motion abnormalities.  2. Right ventricular systolic function is normal. The right ventricular  size is normal.  3. Left atrial size was mildly dilated. No left atrial/left atrial  appendage thrombus was detected.  4. The mitral valve is normal in structure. Mild mitral valve  regurgitation.  5. The aortic valve is normal in structure. Aortic valve regurgitation is  trivial.  6. There is Moderate (Grade III) layered plaque involving the transverse  aorta and descending aorta.  7. Agitated saline contrast bubble study was negative, with no evidence  of any interatrial shunt.    Neuro/Psych neg Seizures negative neurological ROS  negative psych ROS   GI/Hepatic Neg liver ROS, GERD  ,  Endo/Other  negative endocrine ROSneg diabetes  Renal/GU negative Renal ROS     Musculoskeletal  (+) Arthritis ,   Abdominal (+) - obese,   Peds  Hematology  (+) anemia ,   Anesthesia Other Findings Past Medical History: No date: Arthritis No date: Benign essential tremor 2012: Fracture, femur ([place]) No date: GERD (gastroesophageal  reflux disease) No date: H/O echocardiogram     Comment:  05/2013 No date: Hx of colonoscopy     Comment:  04/22/2013 No date: Hypercholesterolemia No date: Hypertension No date: Myocardial infarction [facility])     Comment:  1987 No date: Polyneuropathy 2004: Prostate cancer ([place]) No date: Superficial hematoma   Reproductive/Obstetrics                             Anesthesia Physical Anesthesia Plan  ASA: 3  Anesthesia Plan: General   Post-op Pain Management:    Induction: Intravenous  PONV Risk Score and Plan: 1 and Propofol infusion  Airway Management Planned: Natural Airway  Additional Equipment:   Intra-op Plan:   Post-operative Plan:   Informed Consent: I have reviewed the patients History and Physical, chart, labs and discussed the procedure including the risks, benefits and alternatives for the proposed anesthesia with the patient or authorized representative who has indicated his/her understanding and acceptance.     Dental advisory given  Plan Discussed with: CRNA and Anesthesiologist  Anesthesia Plan Comments:         Anesthesia Quick Evaluation

## 2021-02-19 NOTE — NC FL2 (Signed)
[place] LEVEL OF CARE SCREENING TOOL     IDENTIFICATION  Patient Name: [name] Birthdate: 03/18/1936 Sex: male Admission Date (Current Location): 02/17/2021  [facility] and [place] Number:  Engineering geologist and Address:  [facility], [address], [place], [place] [address]      Provider Number: [id number]  Attending Physician Name and Address:  Sidney Ace, MD  Relative Name and Phone Number:  Gaynelle Cage in law [phone]    Current Level of Care: Hospital Recommended Level of Care: [place] Prior Approval Number:    Date Approved/Denied:   PASRR Number: [id number]  Discharge Plan: SNF    Current Diagnoses: Patient Active Problem List   Diagnosis Date Noted   Cellulitis 02/18/2021   Lumbar compression fracture ([place]) 02/18/2021   Compression fracture of first lumbar vertebra ([place]) 02/17/2021   Cellulitis and abscess of foot    MRSA bacteremia 08/10/2020   Sepsis ([place]) 04/24/2020   Cellulitis of knee, left 04/22/2020   Hypokalemia 04/22/2020   Osteoarthritis of multiple joints 12/15/2017   Hyponatremia 03/28/2017   Abnormality of gait and mobility 01/11/2017   Cloudy vision 01/11/2017   Foot drop, bilateral 01/11/2017   Amputee, great toe, left (HCC) 01/11/2017   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   Macrocytic anemia 09/22/2015   Moderate tricuspid insufficiency 04/21/2015   History of prostate cancer 04/16/2015   Idiopathic peripheral neuropathy 04/16/2015   Essential hypertension 04/16/2015   GERD (gastroesophageal reflux disease) 04/16/2015   Arthritis 04/16/2015   Bilateral lower extremity edema 04/16/2015   Benign essential tremor 10/30/2014   Hyperlipidemia, mixed 123456   Chronic systolic CHF (congestive heart failure), NYHA class 2 ([place]) 04/23/2014   Mitral insufficiency 11/05/2013   CAD (coronary artery disease) 11/01/2013   Basal cell  carcinoma of skin of other parts of face 03/14/2013   Ankle sprain 09/09/2011   History of fibula fracture 09/09/2011    Orientation RESPIRATION BLADDER Height & Weight     Self, Time, Situation, Place  Normal Continent Weight: 85.8 kg Height:  6' (182.9 cm)  BEHAVIORAL SYMPTOMS/MOOD NEUROLOGICAL BOWEL NUTRITION STATUS      Continent Diet (regular diet)  AMBULATORY STATUS COMMUNICATION OF NEEDS Skin   Extensive Assist Verbally Normal, Surgical wounds                       Personal Care Assistance Level of Assistance  Dressing, Bathing Bathing Assistance: Limited assistance   Dressing Assistance: Limited assistance     Functional Limitations Info             SPECIAL CARE FACTORS FREQUENCY  PT (By licensed PT), OT (By licensed OT)     PT Frequency: 5 times per week OT Frequency: 5 times per week            Contractures Contractures Info: Not present    Additional Factors Info  Code Status, Allergies Code Status Info: full code Allergies Info: Codeine, Indocin , Latex, Mupirocin, Plavix , Polysporin , Shellfish Allergy, Tape, Neosporin           Current Medications (02/19/2021):  This is the current hospital active medication list Current Facility-Administered Medications  Medication Dose Route Frequency Provider Last Rate Last Admin   0.9 %  sodium chloride infusion   Intravenous Continuous Ralene Muskrat B, MD 50 mL/hr at 02/18/21 2140 New Bag at 02/18/21 2140   amLODipine (NORVASC) tablet 2.5 mg  2.5 mg Oral Daily Tu, Ching T, DO   2.5 mg at 02/18/21 0941   aspirin EC tablet 162 mg  162 mg Oral Daily Tu, Ching T, DO   162 mg at 02/18/21 0940   ceFAZolin (ANCEF) IVPB 2g/100 mL premix  2 g Intravenous Once Hessie Knows, MD       folic acid (FOLVITE) tablet 1 mg  1 mg Oral Daily Tu, Ching T, DO   1 mg at 02/18/21 0940   gabapentin (NEURONTIN) tablet 1,200 mg  1,200 mg Oral BID Tu, Ching T, DO   1,200 mg at 02/18/21 2139   linezolid (ZYVOX) tablet 600 mg   600 mg Oral BID Tu, Ching T, DO   600 mg at 02/18/21 2139   losartan (COZAAR) tablet 50 mg  50 mg Oral Daily Tu, Ching T, DO   50 mg at 02/18/21 0940   melatonin tablet 5 mg  5 mg Oral QHS Tu, Ching T, DO   5 mg at 02/18/21 2140   morphine 2 MG/ML injection 2 mg  2 mg Intravenous Q3H PRN Ralene Muskrat B, MD       ondansetron (ZOFRAN) injection 4 mg  4 mg Intravenous Q6H PRN Tu, Ching T, DO       oxyCODONE (Oxy IR/ROXICODONE) immediate release tablet 5 mg  5 mg Oral Q4H PRN Priscella Mann, Sudheer B, MD   5 mg at 02/18/21 2251   pantoprazole (PROTONIX) EC tablet 40 mg  40 mg Oral Daily Tu, Ching T, DO   40 mg at 02/18/21 0940   pravastatin (PRAVACHOL) tablet 80 mg  80 mg Oral Daily Tu, Ching T, DO   80 mg at 02/18/21 0941   primidone (MYSOLINE) tablet 50 mg  50 mg Oral q morning Renda Rolls, RPH   50 mg at 02/18/21 0940   And   primidone (MYSOLINE) tablet 100 mg  100 mg Oral QHS Renda Rolls, RPH   100 mg at 02/18/21 2139   rifampin (RIFADIN) capsule 300 mg  300 mg Oral BID Tu, Ching T, DO   300 mg at 02/18/21 2139   vitamin B-12 (CYANOCOBALAMIN) tablet 1,000 mcg  1,000 mcg Oral Daily Tu, Ching T, DO   1,000 mcg at 02/18/21 [id number]     Discharge Medications: Please see discharge summary for a list of discharge medications.  Relevant Imaging Results:  Relevant Lab Results:   Additional Information SS# [id number]  Su Hilt, RN

## 2021-02-19 NOTE — Progress Notes (Cosign Needed)
Patient has compression fracture which requires whole body to be positioned in ways not feasible with a normal bed. Head must be elevated at least 30 degrees or the patient is in severe pain.  Compression fracture and severe pain  requires frequent changes in body position which cannot be achieved with a normal bed. Patient will need for 6 months

## 2021-02-19 NOTE — TOC Progression Note (Signed)
Transition of Care [facility]) - Progression Note    Patient Details  Name: [name] MRN: [id number] Date of Birth: [birth year]  Transition of Care [facility]) CM/SW [place], RN Phone Number: 02/19/2021, 9:33 AM  Clinical Narrative:     The patient will want to be in a semi private room in [facility] with his wife that will also be a patient.  PASSR obtained, FL2 completed, Bedsearch sent, I spoke with Otila Kluver at [facility] and explained, she stated that she will take a look but they could accommodate if they accept the patient also sent out to other facilities         Expected Discharge Plan and Services                                                 Social Determinants of Health (SDOH) Interventions    Readmission Risk Interventions No flowsheet data found.

## 2021-02-19 NOTE — TOC Progression Note (Signed)
Transition of Care [facility]) - Progression Note    Patient Details  Name: [name] MRN: [id number] Date of Birth: January 05, 1936  Transition of Care [facility]) CM/SW Sebastian, RN Phone Number: 02/19/2021, 1:59 PM  Clinical Narrative:     The patient wants to go to [facility], I spoke with Seth Bake at [facility] and hey have no beds available and do not think they will any time soon, I notified the patient's wife [name] , She stated that they wanted to go home and have a private duty nurse come to their home daily, they will private pay and have all the needed resources at home in way of equipment, I spoke with [facility] with the patient's  wife's permission to arrange PCA they determined that they will send someone to make arrangements for someone to come out to start on Monday, I called [facility] and spoke Beverely Low they can not take 2 patients (Husband and wife) at the same time, but they can possibly arrange 2 trucks to be just a few min apart, they ask for an early call to arrange on Monday for Monday afternoon transport home.  I spoke with the patient's wife [name] and explained, she stated that they would like to arrange [facility] PCA for the first week to be 24 hours a day, She stated that her husband has a wheelchair, they have a Bedside commode and several rolling walkers.  I spoke with Mallory at [facility]. T I set up the patient for PT, OT and Nursing [facility]u [facility]. [facility] will be here at the hospital to complete paperwork and get arranged today prior to 5 PM         Expected Discharge Plan and Services                                                 Social Determinants of Health (SDOH) Interventions    Readmission Risk Interventions No flowsheet data found.

## 2021-02-19 NOTE — Plan of Care (Signed)
PMT note:  In to follow up with patient on [place] as he was sleepy and unable to have conversation yesterday. He is currently in OR at this time. Will follow up Monday.

## 2021-02-20 NOTE — Evaluation (Signed)
Physical Therapy Evaluation Patient Details Name: [name] MRN: [id number] DOB: [birth year] Today's Date: 02/20/2021   History of Present Illness  85 yo male with onset of back pain for a length of time had worsening pain after a fall and was admitted 9/7.  Has been mostly in a wheelchair since his lumbar soft tissue infection on 8/18.  Pt received kyphoplasty for L2 and L 3 on 9/9, compression on L1 was not corrected.  Additionally R hand third digit cellulitis and paronychia, no osteomyelitis; noted hyponatremia.  PMHx:  prostate CA with surgery, HTN, CAD, CHF, L great toe amp, MRSA bacteremia, back abscess, HLD,  Clinical Impression  Pt was seen for initiation of mobility after two kyphoplasties yesterday on L2 and L3, and one chronic unrepaired L1 compression fracture.  Pt has been at wheelchair level for three weeks preceding this, then leaned forward and fell out of the chair.  Given his length of time off his feet, current struggle with mobility and the needs of his wife, will recommend him to SNF before going home with family help.  He requires skilled assistance to manage his precautions, his pain issues and the co-morbidities of his other health issues.  Follow for goals of acute PT as listed and focus on posture, safety of gait and increasing awareness of his spinal precautions.    Follow Up Recommendations SNF    Equipment Recommendations  None recommended by PT    Recommendations for Other Services       Precautions / Restrictions Precautions Precautions: Fall;Back Precaution Booklet Issued: No Precaution Comments: verbally reviewed with pt but was interrupted with wife in the room talking loudly Required Braces or Orthoses:  (no back brace) Restrictions Weight Bearing Restrictions: No Other Position/Activity Restrictions: spinal precautions      Mobility  Bed Mobility Overal bed mobility: Needs Assistance Bed Mobility: Rolling;Sidelying to Sit;Sit to Sidelying Rolling:  Mod assist Sidelying to sit: Mod assist     Sit to sidelying: Mod assist General bed mobility comments: mod assist to control trunk and maintain back precautions    Transfers Overall transfer level: Needs assistance Equipment used: Rolling walker (2 wheeled);1 person hand held assist Transfers: Sit to/from Stand Sit to Stand: Mod assist         General transfer comment: mod to power up and min to maintain standing balance control  Ambulation/Gait Ambulation/Gait assistance: Min assist Gait Distance (Feet): 10 Feet (7+3) Assistive device: Rolling walker (2 wheeled);1 person hand held assist Gait Pattern/deviations: Step-to pattern;Decreased stride length;Wide base of support;Trunk flexed Gait velocity: reduced Gait velocity interpretation: <1.8 ft/sec, indicate of risk for recurrent falls General Gait Details: cued trunk extension, cued hand placemetn and cued sequence with walker  Stairs            Wheelchair Mobility    Modified Rankin (Stroke Patients Only)       Balance Overall balance assessment: Needs assistance;History of Falls Sitting-balance support: Feet supported;Single extremity supported Sitting balance-Leahy Scale: Fair     Standing balance support: Bilateral upper extremity supported;During functional activity Standing balance-Leahy Scale: Poor                               Pertinent Vitals/Pain Pain Assessment: 0-10 Pain Score: 7  Pain Location: back Pain Descriptors / Indicators: Grimacing;Guarding Pain Intervention(s): Limited activity within patient's tolerance;Monitored during session;Repositioned;Patient requesting pain meds-RN notified    Home Living Family/patient expects to be discharged to::  Private residence Living Arrangements: Spouse/significant other Available Help at Discharge: Family;Available 24 hours/day Type of Home: House Home Access: Ramped entrance     Home Layout: One level Home Equipment: Walker - 2  wheels;Wheelchair - manual;Shower seat;Grab bars - tub/shower;Grab bars - toilet Additional Comments: Pt will be home with family assist but has limits of mobility that make SNF good option    Prior Function Level of Independence: Independent with assistive device(s)         Comments: Has been in wheelchair mostly since 8/18.     Hand Dominance   Dominant Hand: Right    Extremity/Trunk Assessment   Upper Extremity Assessment Upper Extremity Assessment: Defer to OT evaluation (has claw type posture on hands but can close his grip on the walker)    Lower Extremity Assessment Lower Extremity Assessment: Generalized weakness    Cervical / Trunk Assessment Cervical / Trunk Assessment: Other exceptions (Lumbar fractures, recent spinal soft tissue abscess)  Communication   Communication: HOH (augmented hearing devices)  Cognition Arousal/Alertness: Awake/alert Behavior During Therapy: Anxious Overall Cognitive Status: Within Functional Limits for tasks assessed                                 General Comments: pt is able to give accurate history, but wife interrupts often      General Comments General comments (skin integrity, edema, etc.): pt is fairly unsafe in his set up to stand and required reminders about not scooting to EOB too closely each trial    Exercises     Assessment/Plan    PT Assessment Patient needs continued PT services  PT Problem List Decreased strength;Decreased range of motion;Decreased activity tolerance;Decreased balance;Decreased mobility;Decreased coordination;Decreased safety awareness;Decreased skin integrity;Pain       PT Treatment Interventions DME instruction;Gait training;Functional mobility training;Therapeutic activities;Therapeutic exercise;Balance training;Neuromuscular re-education;Patient/family education    PT Goals (Current goals can be found in the Care Plan section)  Acute Rehab PT Goals Patient Stated Goal: to  get back on his feet PT Goal Formulation: With patient Time For Goal Achievement: 03/06/21 Potential to Achieve Goals: Good    Frequency BID   Barriers to discharge Inaccessible home environment;Decreased caregiver support home with family but wife has just been injured as well    Co-evaluation               AM-PAC PT "6 Clicks" Mobility  Outcome Measure Help needed turning from your back to your side while in a flat bed without using bedrails?: A Lot Help needed moving from lying on your back to sitting on the side of a flat bed without using bedrails?: A Lot Help needed moving to and from a bed to a chair (including a wheelchair)?: A Lot Help needed standing up from a chair using your arms (e.g., wheelchair or bedside chair)?: A Lot Help needed to walk in hospital room?: A Lot Help needed climbing 3-5 steps with a railing? : Total 6 Click Score: 11    End of Session Equipment Utilized During Treatment: Gait belt Activity Tolerance: Patient limited by fatigue;Patient limited by pain;Treatment limited secondary to medical complications (Comment) Patient left: in bed;with call bell/phone within reach;with bed alarm set;with family/visitor present Nurse Communication: Mobility status;Patient requests pain meds PT Visit Diagnosis: Unsteadiness on feet (R26.81);Muscle weakness (generalized) (M62.81);History of falling (Z91.81);Pain Pain - Right/Left:  (back) Pain - part of body:  (back)    Time: 1000-1040 PT Time  Calculation (min) (ACUTE ONLY): 40 min   Charges:   PT Evaluation $PT Eval Moderate Complexity: 1 Mod PT Treatments $Gait Training: 8-22 mins       Ramond Dial 02/20/2021, 12:38 PM  Mee Hives, PT MS Acute Rehab Dept. Number: [place] and [place]

## 2021-02-20 NOTE — Plan of Care (Signed)
No acute events during the night. VSS. Dressing to back remains dry and intact. PRN oxycodone x 1 for back pain.  Problem: Education: Goal: Knowledge of General Education information will improve Description: Including pain rating scale, medication(s)/side effects and non-pharmacologic comfort measures Outcome: Progressing   Problem: Health Behavior/Discharge Planning: Goal: Ability to manage health-related needs will improve Outcome: Progressing   Problem: Clinical Measurements: Goal: Ability to maintain clinical measurements within normal limits will improve Outcome: Progressing Goal: Will remain free from infection Outcome: Progressing Goal: Diagnostic test results will improve Outcome: Progressing Goal: Respiratory complications will improve Outcome: Progressing Goal: Cardiovascular complication will be avoided Outcome: Progressing   Problem: Activity: Goal: Risk for activity intolerance will decrease Outcome: Progressing   Problem: Nutrition: Goal: Adequate nutrition will be maintained Outcome: Progressing   Problem: Coping: Goal: Level of anxiety will decrease Outcome: Progressing   Problem: Elimination: Goal: Will not experience complications related to bowel motility Outcome: Progressing Goal: Will not experience complications related to urinary retention Outcome: Progressing   Problem: Pain Managment: Goal: General experience of comfort will improve Outcome: Progressing   Problem: Safety: Goal: Ability to remain free from injury will improve Outcome: Progressing   Problem: Skin Integrity: Goal: Risk for impaired skin integrity will decrease Outcome: Progressing   Problem: Education: Goal: Ability to describe self-care measures that may prevent or decrease complications (Diabetes Survival Skills Education) will improve Outcome: Progressing Goal: Individualized Educational Video(s) Outcome: Progressing   Problem: Coping: Goal: Ability to adjust to  condition or change in health will improve Outcome: Progressing   Problem: Fluid Volume: Goal: Ability to maintain a balanced intake and output will improve Outcome: Progressing   Problem: Health Behavior/Discharge Planning: Goal: Ability to identify and utilize available resources and services will improve Outcome: Progressing Goal: Ability to manage health-related needs will improve Outcome: Progressing   Problem: Metabolic: Goal: Ability to maintain appropriate glucose levels will improve Outcome: Progressing   Problem: Nutritional: Goal: Maintenance of adequate nutrition will improve Outcome: Progressing Goal: Progress toward achieving an optimal weight will improve Outcome: Progressing   Problem: Skin Integrity: Goal: Risk for impaired skin integrity will decrease Outcome: Progressing   Problem: Tissue Perfusion: Goal: Adequacy of tissue perfusion will improve Outcome: Progressing

## 2021-02-20 NOTE — Progress Notes (Signed)
PT Cancellation Note  Patient Details Name: [name] MRN: [id number] DOB: [birth year]   Cancelled Treatment:    Reason Eval/Treat Not Completed: Pain limiting ability to participate;Fatigue/lethargy limiting ability to participate;Other (comment).  PT returned to do PM therapy and pt declines, reporting he has already been up and is too tired.  Pt was asked about going to rehab but declines to discuss this with PT.  Has a previous poor rehab experience.  Follow up with pt for further mobility as time and pt allow.   Ramond Dial 02/20/2021, 4:52 PM  Mee Hives, PT MS Acute Rehab Dept. Number: [place] and [place]

## 2021-02-20 NOTE — Progress Notes (Signed)
  Subjective: 1 Day Post-Op Procedure(s) (LRB): KYPHOPLASTY-L2&L3 (N/A) Patient reports pain as  improved .   Patient is well, and has had no acute complaints or problems Plan is to go Home with private care after hospital stay Negative for chest pain and shortness of breath Fever: no   Objective: Vital signs in last 24 hours: Temp:  [97.1 F (36.2 C)-98.5 F (36.9 C)] 98.4 F (36.9 C) (09/10 0835) Pulse Rate:  [68-89] 88 (09/10 0835) Resp:  [10-18] 18 (09/10 0835) BP: (114-152)/(67-86) 132/86 (09/10 0835) SpO2:  [94 %-100 %] 97 % (09/10 0835) Weight:  [93 kg] 93 kg (09/09 1235)  Intake/Output from previous day:  Intake/Output Summary (Last 24 hours) at 02/20/2021 1200 Last data filed at 02/20/2021 0903 Gross per 24 hour  Intake 1401.67 ml  Output 802 ml  Net 599.67 ml    Intake/Output this shift: Total I/O In: 501.7 [I.V.:501.7] Out: 600 [Urine:600]  Labs: Recent Labs    02/17/21 1247 02/19/21 0814  HGB 10.8* 9.3*   Recent Labs    02/17/21 1247 02/19/21 0814  WBC 3.8* 4.8  RBC RESULTS UNAVAILABLE DUE TO INTERFERING SUBSTANCE 2.47*  HCT RESULTS UNAVAILABLE DUE TO INTERFERING SUBSTANCE 25.1*  PLT 142* 204   Recent Labs    02/18/21 0700 02/19/21 0814  NA 126* 128*  K 2.9* 3.5  CL 88* 94*  CO2 27 26  BUN 9 11  CREATININE 0.76 0.85  GLUCOSE 91 106*  CALCIUM 9.4 9.5   No results for input(s): LABPT, INR in the last 72 hours.   EXAM General - Patient is Alert, Appropriate, and Oriented Extremity - Compartment soft Able to plantarflex and dorsiflex bilateral ankles Dressing/Incision -clean, dry    Assessment/Plan: 1 Day Post-Op Procedure(s) (LRB): KYPHOPLASTY-L2&L3 (N/A) Principal Problem:   Compression fracture of first lumbar vertebra (HCC) Active Problems:   Essential hypertension   Chronic systolic CHF (congestive heart failure), NYHA class 2 (HCC)   Hyponatremia   Cellulitis   Lumbar compression fracture (HCC)  Estimated body mass  index is 27.81 kg/m as calculated from the following:   Height as of this encounter: 6' (1.829 m).   Weight as of this encounter: 93 kg. Advance diet Up with therapy  Discharge home Monday      DVT Prophylaxis - Lovenox and SCDs   Cassell Smiles, PA-C [facility] 02/20/2021, 12:00 PM

## 2021-02-20 NOTE — Evaluation (Signed)
Occupational Therapy Evaluation Patient Details Name: [name] MRN: [id number] DOB: Oct 18, 1935 Today's Date: 02/20/2021    History of Present Illness 85 yo male with onset of back pain for a length of time had worsening pain after a fall and was admitted 9/7.  Has been mostly in a wheelchair since his lumbar soft tissue infection on 8/18.  Pt received kyphoplasty for L2 and L 3 on 9/9, compression on L1 was not corrected.  Additionally R hand third digit cellulitis and paronychia, no osteomyelitis; noted hyponatremia.  PMHx:  prostate CA with surgery, HTN, CAD, CHF, L great toe amp, MRSA bacteremia, back abscess, HLD,   Clinical Impression   [name] was seen for OT evaluation this date. Prior to hospital admission, pt was MOD I for mobility using RW (was w/c bound for ~4 months starting Feb 2022 2/2 foot ulcer, retunred to walking in June. Requires assist for dressing baseline 2/2 [place] deficits). Pt lives with wife and private caregivers PRN. Pt presents to acute OT demonstrating impaired ADL performance and functional mobility 2/2 decreased activity tolerance, pain, and functional strength/balance deficits.   Pt currently requires MAX A for LBD at bed level. SETUP + SBA seated grooming tasks. MIN A + RW for ADL t/f - significantly increased time to complete, asisst to stabilize RW. Pt would benefit from skilled OT to address noted impairments to maximize safety and independence while minimizing falls risk and caregiver burden. Upon hospital discharge, recommend STR to maximize pt safety and return to PLOF.     Follow Up Recommendations  SNF;Supervision/Assistance - 24 hour    Equipment Recommendations  Hospital bed    Recommendations for Other Services       Precautions / Restrictions Precautions Precautions: Fall;Back Precaution Booklet Issued: No Precaution Comments: verbally reviewed with pt but was interrupted with wife in the room talking loudly Required Braces or Orthoses:  (no  back brace) Restrictions Weight Bearing Restrictions: No Other Position/Activity Restrictions: spinal precautions      Mobility Bed Mobility Overal bed mobility: Needs Assistance Bed Mobility: Rolling;Sidelying to Sit;Sit to Sidelying Rolling: Mod assist Sidelying to sit: Mod assist     Sit to sidelying: Mod assist General bed mobility comments: mod assist to control trunk and maintain back precautions    Transfers Overall transfer level: Needs assistance Equipment used: Rolling walker (2 wheeled) Transfers: Sit to/from Stand Sit to Stand: Min assist         General transfer comment: increased time to complete    Balance Overall balance assessment: Needs assistance;History of Falls Sitting-balance support: Feet supported;Single extremity supported Sitting balance-Leahy Scale: Fair     Standing balance support: Bilateral upper extremity supported;During functional activity Standing balance-Leahy Scale: Fair                             ADL either performed or assessed with clinical judgement   ADL Overall ADL's : Needs assistance/impaired                                       General ADL Comments: MAX A for LBD at bed level. SETUP + SBA seated grooming tasks. MIN A + RW for ADL t/f - significantly increased time to complete, asisst to stabilize RW.      Pertinent Vitals/Pain Pain Assessment: Faces Pain Score: 7  Faces Pain Scale: Hurts even more  Pain Location: back Pain Descriptors / Indicators: Grimacing;Guarding Pain Intervention(s): Limited activity within patient's tolerance;Repositioned     Hand Dominance Right   Extremity/Trunk Assessment Upper Extremity Assessment Upper Extremity Assessment: Generalized weakness (hx of ideopathic neuropathy limiting FMC)   Lower Extremity Assessment Lower Extremity Assessment: Generalized weakness   Cervical / Trunk Assessment Cervical / Trunk Assessment: Other exceptions (Lumbar  fractures, recent spinal soft tissue abscess)   Communication Communication Communication: HOH   Cognition Arousal/Alertness: Awake/alert Behavior During Therapy: Anxious Overall Cognitive Status: Within Functional Limits for tasks assessed                                 General Comments: A&O x4   General Comments  VSS t/o    Exercises Exercises: Other exercises Other Exercises Other Exercises: Pt educated re; OT role, DME recs, d/c recs, falls prevention, ECS Other Exercises: LBD, sup<>sit, sit<>stand, sitting/standing balance/tolerance   Shoulder Instructions      Home Living Family/patient expects to be discharged to:: Private residence Living Arrangements: Spouse/significant other Available Help at Discharge: Family;Available 24 hours/day Type of Home: House Home Access: Ramped entrance     Home Layout: One level     Bathroom Shower/Tub: Teacher, early years/pre: Handicapped height     Home Equipment: Environmental consultant - 2 wheels;Wheelchair - manual;Shower seat;Grab bars - tub/shower;Grab bars - toilet   Additional Comments: Pt will be home with family assist but has limits of mobility that make SNF good option      Prior Functioning/Environment Level of Independence: Independent with assistive device(s)        Comments: was w/c bound for ~4 months starting Feb 2022 2/2 foot ulcer, retunred to walking in June. Requires assist for dressing baseline 2/2 FMC deficits        OT Problem List: Decreased strength;Decreased range of motion;Decreased activity tolerance;Impaired balance (sitting and/or standing);Decreased safety awareness      OT Treatment/Interventions: Self-care/ADL training;Therapeutic exercise;Energy conservation;DME and/or AE instruction;Therapeutic activities;Patient/family education;Balance training    OT Goals(Current goals can be found in the care plan section) Acute Rehab OT Goals Patient Stated Goal: to get back on his  feet OT Goal Formulation: With patient/family Time For Goal Achievement: 03/06/21 Potential to Achieve Goals: Good ADL Goals Pt Will Perform Grooming: with set-up;with supervision;sitting Pt Will Perform Lower Body Dressing: with min assist;sit to/from stand (c LRAD PRN) Pt Will Transfer to Toilet: ambulating;with supervision;regular height toilet (c LRAD PRN)  OT Frequency: Min 2X/week   Barriers to D/C:            Co-evaluation              AM-PAC OT "6 Clicks" Daily Activity     Outcome Measure Help from another person eating meals?: None Help from another person taking care of personal grooming?: A Little Help from another person toileting, which includes using toliet, bedpan, or urinal?: A Lot Help from another person bathing (including washing, rinsing, drying)?: A Lot Help from another person to put on and taking off regular upper body clothing?: A Little Help from another person to put on and taking off regular lower body clothing?: A Lot 6 Click Score: 16   End of Session Equipment Utilized During Treatment: Rolling walker;Gait belt  Activity Tolerance: Patient tolerated treatment well Patient left: in bed;with call bell/phone within reach;with bed alarm set  OT Visit Diagnosis: Muscle weakness (generalized) (M62.81);History of falling (Z91.81)  TimeSO:[id number] OT Time Calculation (min): 20 min Charges:  OT General Charges $OT Visit: 1 Visit OT Evaluation $OT Eval Low Complexity: 1 Low OT Treatments $Self Care/Home Management : 8-22 mins  Dessie Coma, M.S. OTR/L  02/20/21, 2:14 PM  ascom [phone]

## 2021-02-20 NOTE — Progress Notes (Signed)
PROGRESS NOTE    [name]  [id number] DOB: [birth year] DOA: 02/17/2021 PCP: [name], DO   Brief Narrative:  85 y.o. male with medical history significant for remote history of prostate cancer s/p prostatectomy, hypertension, CAD, chronic systolic heart failure, s/p left great toe MTP amputation, hx of MRSA bacteremia who presents with worsening back pain following a recent fall.   Patient had MRSA bacteremia and left foot infection in February requiring IV daptomycin for 2 weeks followed by p.o. linezolid.  He then later had lumbar soft tissue infection with I&D on 01/28/2021 he was treated with p.o. linezolid and p.o. rifampin.  Since then patient has been wheelchair-bound.  States about 2-1/2 weeks ago he fell out of his wheelchair landing on his right hip since it was not locked.  Has been experiencing severe back pain around lumbar spine.  Not taking anything for pain at home.  His wife was recently admitted here for a hip fracture and since then he has been laying at home with no one to help him get out of bed in the past few days.  Has not had anything to drink.  He finally called his doctor who sent EMS to bring him to the ED.  He endorsed some numbness and tingling of the right anterior thigh.  Denies any saddle anesthesia.  No urinary or bowel incontinent.  On presentation MRI imaging survey demonstrated lumbar vertebral disc disease associated with compression fracture of L2 and L3 vertebra.  Status post L2-L3 kyphoplasty with orthopedic surgery.  Pending physical therapy evaluation postoperatively   Assessment & Plan:   Principal Problem:   Compression fracture of first lumbar vertebra (HCC) Active Problems:   Essential hypertension   Chronic systolic CHF (congestive heart failure), NYHA class 2 (HCC)   Hyponatremia   Cellulitis   Lumbar compression fracture (HCC)  Acute compression fracture of the L2 and L3 vertebral body with height loss Severe chronic  compression deformity at L1 Dr. Rudene Christians consulted Plan: L2/L3 kyphoplasty complete.  Continue therapy evaluations and pain control as necessary    Hyponatremia Unclear chronicity.  Improving with IV isotonic fluids Plan: Discontinue IV fluids Check a.m. sodium level   Paronychia/cellulitis of the third digit of the right hand recent MRSA back abscess Patient does not recall mechanism of injury.  Wound was wrapped up by neighbor. Right hand x-ray negative for osteomyelitis Plan: Discussed with ID.  No further indication for antibiotics.  Discontinue Zyvox and rifampin   recent MRSA back abscess -Zyvox and rifampin discontinued -follows with  [name] of ID   Chronic systolic heart failure - Patient appears hypovolemic on exam.  Hold Lasix while receiving fluid - Last EF of 55 to 60%   HTN -continue amlodipine, losartan   Hyperlipidemia - continue pravastatin    DVT prophylaxis: SCD Code Status: Full Family Communication: None today Disposition Plan: Status is: Inpatient  Remains inpatient appropriate because:Inpatient level of care appropriate due to severity of illness  Dispo: The patient is from: Home              Anticipated d/c is to: SNF              Patient currently is not medically stable to d/c.   Difficult to place patient No   Level of care: Med-Surg  Consultants:  Orthopedics  Procedures:  None  Antimicrobials:   Subjective: Seen and examined.  History limited by hearing loss and likely Dementia.  No visible distress.  Back  pain improved.  Objective: Vitals:   02/20/21 0011 02/20/21 0333 02/20/21 0832 02/20/21 0835  BP: 130/69 136/78 132/86 132/86  Pulse: 89 89  88  Resp: '18 14  18  '$ Temp: 98.5 F (36.9 C) 98.4 F (36.9 C)  98.4 F (36.9 C)  TempSrc: Oral Oral    SpO2: 100% 98%  97%  Weight:      Height:        Intake/Output Summary (Last 24 hours) at 02/20/2021 1033 Last data filed at 02/20/2021 0903 Gross per 24 hour   Intake 1401.67 ml  Output 802 ml  Net 599.67 ml   Filed Weights   02/17/21 1235 02/19/21 0908 02/19/21 1235  Weight: 85.8 kg 93 kg 93 kg    Examination:  General exam: No acute distress.  Appears frail Respiratory system: Lungs clear.  Normal work of breathing.  Room air Cardiovascular system: S1-S2, RRR, no murmurs, no pedal edema Gastrointestinal system: Thin, soft, NT/ND, normal bowel sounds  Central nervous system: Alert to person and place only.  No focal deficits Extremities: Decreased power BLE.  Decreased ROM lower extremities Skin: No rashes, lesions or ulcers Psychiatry: Judgement and insight appear impaired. Mood & affect flattened.     Data Reviewed: I have personally reviewed following labs and imaging studies  CBC: Recent Labs  Lab 02/17/21 1247 02/19/21 0814  WBC 3.8* 4.8  NEUTROABS 2.0 2.4  HGB 10.8* 9.3*  HCT RESULTS UNAVAILABLE DUE TO INTERFERING SUBSTANCE 25.1*  MCV RESULTS UNAVAILABLE DUE TO INTERFERING SUBSTANCE 101.6*  PLT 142* 0000000   Basic Metabolic Panel: Recent Labs  Lab 02/17/21 1247 02/18/21 0700 02/19/21 0814  NA 126* 126* 128*  K 2.9* 2.9* 3.5  CL 85* 88* 94*  CO2 '30 27 26  '$ GLUCOSE 119* 91 106*  BUN '11 9 11  '$ CREATININE 0.75 0.76 0.85  CALCIUM 10.0 9.4 9.5  MG  --   --  1.7   GFR: Estimated Creatinine Clearance: 72.3 mL/min (by C-G formula based on SCr of 0.85 mg/dL). Liver Function Tests: Recent Labs  Lab 02/17/21 1247  AST 15  ALT 15  ALKPHOS 67  BILITOT 0.5  PROT 7.2  ALBUMIN 4.0   No results for input(s): LIPASE, AMYLASE in the last 168 hours. No results for input(s): AMMONIA in the last 168 hours. Coagulation Profile: No results for input(s): INR, PROTIME in the last 168 hours. Cardiac Enzymes: No results for input(s): CKTOTAL, CKMB, CKMBINDEX, TROPONINI in the last 168 hours. BNP (last 3 results) No results for input(s): PROBNP in the last 8760 hours. HbA1C: No results for input(s): HGBA1C in the last 72  hours. CBG: No results for input(s): GLUCAP in the last 168 hours. Lipid Profile: No results for input(s): CHOL, HDL, LDLCALC, TRIG, CHOLHDL, LDLDIRECT in the last 72 hours. Thyroid Function Tests: No results for input(s): TSH, T4TOTAL, FREET4, T3FREE, THYROIDAB in the last 72 hours. Anemia Panel: No results for input(s): VITAMINB12, FOLATE, FERRITIN, TIBC, IRON, RETICCTPCT in the last 72 hours. Sepsis Labs: No results for input(s): PROCALCITON, LATICACIDVEN in the last 168 hours.  Recent Results (from the past 240 hour(s))  SARS CORONAVIRUS 2 (TAT 6-24 HRS) Nasopharyngeal Nasopharyngeal Swab     Status: None   Collection Time: 02/17/21 12:48 PM   Specimen: Nasopharyngeal Swab  Result Value Ref Range Status   SARS Coronavirus 2 NEGATIVE NEGATIVE Final    Comment: (NOTE) SARS-CoV-2 target nucleic acids are NOT DETECTED.  The SARS-CoV-2 RNA is generally detectable in upper and lower respiratory  specimens during the acute phase of infection. Negative results do not preclude SARS-CoV-2 infection, do not rule out co-infections with other pathogens, and should not be used as the sole basis for treatment or other patient management decisions. Negative results must be combined with clinical observations, patient history, and epidemiological information. The expected result is Negative.  Fact Sheet for Patients: SugarRoll.be  Fact Sheet for Healthcare Providers: https://www.woods-mathews.com/  This test is not yet approved or cleared by the [place] FDA and  has been authorized for detection and/or diagnosis of SARS-CoV-2 by FDA under an Emergency Use Authorization (EUA). This EUA will remain  in effect (meaning this test can be used) for the duration of the COVID-19 declaration under Se ction 564(b)(1) of the Act, 21 U.S.C. section 360bbb-3(b)(1), unless the authorization is terminated or revoked sooner.  Performed at [facility], [place] [address] [place], [place] [address]   Surgical PCR screen     Status: Abnormal   Collection Time: 02/19/21 10:03 AM   Specimen: Nasal Mucosa; Nasal Swab  Result Value Ref Range Status   MRSA, PCR POSITIVE (A) NEGATIVE Final    Comment: RESULT CALLED TO, READ BACK BY AND VERIFIED WITH: BET BUJIANG,RN AT 1142 ON 02/19/21 BY GM    Staphylococcus aureus POSITIVE (A) NEGATIVE Final    Comment: (NOTE) The Xpert SA Assay (FDA approved for NASAL specimens in patients 79 years of age and older), is one component of a comprehensive surveillance program. It is not intended to diagnose infection nor to guide or monitor treatment. Performed at [facility], [address] [place], [place] [address]          Radiology Studies: DG Lumbar Spine 2-3 Views  Result Date: 02/19/2021 CLINICAL DATA:  Elective surgery.  Kyphoplasty. EXAM: DG C-ARM 1-60 MIN; LUMBAR SPINE - 2-3 VIEW FLUOROSCOPY TIME:  Fluoroscopy Time:  1 minute 32 seconds. Number of Acquired Spot Images: 2 COMPARISON:  MRI lumbar spine 02/17/2021. FINDINGS: Two C-arm fluoroscopic images were obtained intraoperatively and submitted for post operative interpretation. Transitional lumbosacral anatomy with partially lumbarized S1 segment, as described on prior MRI. Remote L1 compression fracture with near complete height loss. Interval kyphoplasty at L2 and L3 without significant extravasation. Please see the performing provider's procedural report for further detail. IMPRESSION: Intraoperative fluoroscopy, as detailed above. Electronically Signed   By: Margaretha Sheffield M.D.   On: 02/19/2021 14:40   DG C-Arm 1-60 Min  Result Date: 02/19/2021 CLINICAL DATA:  Elective surgery.  Kyphoplasty. EXAM: DG C-ARM 1-60 MIN; LUMBAR SPINE - 2-3 VIEW FLUOROSCOPY TIME:  Fluoroscopy Time:  1 minute 32 seconds. Number of Acquired Spot Images: 2 COMPARISON:  MRI lumbar spine 02/17/2021. FINDINGS: Two C-arm fluoroscopic images were  obtained intraoperatively and submitted for post operative interpretation. Transitional lumbosacral anatomy with partially lumbarized S1 segment, as described on prior MRI. Remote L1 compression fracture with near complete height loss. Interval kyphoplasty at L2 and L3 without significant extravasation. Please see the performing provider's procedural report for further detail. IMPRESSION: Intraoperative fluoroscopy, as detailed above. Electronically Signed   By: Margaretha Sheffield M.D.   On: 02/19/2021 14:40        Scheduled Meds:  amLODipine  2.5 mg Oral Daily   aspirin EC  162 mg Oral Daily   docusate sodium  100 mg Oral BID   enoxaparin (LOVENOX) injection  40 mg Subcutaneous Q24H   feeding supplement  237 mL Oral [id number]   folic acid  1 mg Oral  Daily   gabapentin  1,200 mg Oral BID   losartan  50 mg Oral Daily   melatonin  5 mg Oral QHS   multivitamin with minerals  1 tablet Oral Daily   mupirocin ointment  1 application Nasal BID   pantoprazole  40 mg Oral Daily   pravastatin  80 mg Oral Daily   primidone  50 mg Oral q morning   And   primidone  100 mg Oral QHS   cyanocobalamin  1,000 mcg Oral Daily   Continuous Infusions:  sodium chloride 50 mL/hr at 02/19/21 2235     LOS: 2 days    Time spent: 25 minutes    Sidney Ace, MD [facility] Pager [phone]  If 7PM-7AM, please contact night-coverage 02/20/2021, 10:33 AM

## 2021-02-21 LAB — BASIC METABOLIC PANEL
Anion gap: 8 (ref 5–15)
BUN: 9 mg/dL (ref 8–23)
CO2: 25 mmol/L (ref 22–32)
Calcium: 8.9 mg/dL (ref 8.9–10.3)
Chloride: 98 mmol/L (ref 98–111)
Creatinine, Ser: 0.67 mg/dL (ref 0.61–1.24)
GFR, Estimated: >60 mL/min (ref >60)
Glucose, Bld: 103 mg/dL — ABNORMAL HIGH (ref 70–99)
Potassium: 3.5 mmol/L (ref 3.5–5.1)
Sodium: 131 mmol/L — ABNORMAL LOW (ref 135–145)

## 2021-02-21 MED ORDER — POLYETHYLENE GLYCOL 3350 17 G PO PACK
17.0000 g | PACK | Freq: Every day | ORAL | Status: DC
  Administered 2021-02-21 – 2021-02-22 (×2): 17 g via ORAL
  Filled 2021-02-21 (×2): qty 1

## 2021-02-21 MED ORDER — BISACODYL 10 MG RE SUPP
10.0000 mg | Freq: Every day | RECTAL | Status: DC | PRN
  Administered 2021-02-22: 10 mg via RECTAL
  Filled 2021-02-21: qty 1

## 2021-02-21 MED ORDER — SENNOSIDES-DOCUSATE SODIUM 8.6-50 MG PO TABS
1.0000 | ORAL_TABLET | Freq: Two times a day (BID) | ORAL | Status: DC
  Administered 2021-02-21 – 2021-02-22 (×3): 1 via ORAL
  Filled 2021-02-21 (×3): qty 1

## 2021-02-21 MED ORDER — ALUM & MAG HYDROXIDE-SIMETH 200-200-20 MG/5ML PO SUSP
30.0000 mL | ORAL | Status: DC | PRN
  Administered 2021-02-21: 30 mL via ORAL
  Filled 2021-02-21: qty 30

## 2021-02-21 NOTE — Progress Notes (Signed)
PROGRESS NOTE    [name]  [id number] DOB: [birth year] DOA: 02/17/2021 PCP: [name], DO   Brief Narrative:  85 y.o. male with medical history significant for remote history of prostate cancer s/p prostatectomy, hypertension, CAD, chronic systolic heart failure, s/p left great toe MTP amputation, hx of MRSA bacteremia who presents with worsening back pain following a recent fall.   Patient had MRSA bacteremia and left foot infection in February requiring IV daptomycin for 2 weeks followed by p.o. linezolid.  He then later had lumbar soft tissue infection with I&D on 01/28/2021 he was treated with p.o. linezolid and p.o. rifampin.  Since then patient has been wheelchair-bound.  States about 2-1/2 weeks ago he fell out of his wheelchair landing on his right hip since it was not locked.  Has been experiencing severe back pain around lumbar spine.  Not taking anything for pain at home.  His wife was recently admitted here for a hip fracture and since then he has been laying at home with no one to help him get out of bed in the past few days.  Has not had anything to drink.  He finally called his doctor who sent EMS to bring him to the ED.  He endorsed some numbness and tingling of the right anterior thigh.  Denies any saddle anesthesia.  No urinary or bowel incontinent.  On presentation MRI imaging survey demonstrated lumbar vertebral disc disease associated with compression fracture of L2 and L3 vertebra.  Status post L2-L3 kyphoplasty with orthopedic surgery.  Physical therapy has evaluated.  Plan to discharge home with home health Monday 9/12.    Assessment & Plan:   Principal Problem:   Compression fracture of first lumbar vertebra (HCC) Active Problems:   Essential hypertension   Chronic systolic CHF (congestive heart failure), NYHA class 2 (HCC)   Hyponatremia   Cellulitis   Lumbar compression fracture (HCC)  Acute compression fracture of the L2 and L3 vertebral body with  height loss Severe chronic compression deformity at L1 Dr. Rudene Christians consulted Status post L2-L3 kyphoplasty Plan: Postoperative pain control.  Therapy as tolerated. Plan discharge home with home health and 24/7 care Monday 9/12    Hyponatremia Unclear chronicity.  Improving with IV isotonic fluids IV fluids stopped 9/10 Plan: No further fluids   Paronychia/cellulitis of the third digit of the right hand recent MRSA back abscess Patient does not recall mechanism of injury.  Wound was wrapped up by neighbor. Right hand x-ray negative for osteomyelitis Plan: Discussed with ID.  No further indication for antibiotics.  Discontinue Zyvox and rifampin   recent MRSA back abscess -Zyvox and rifampin discontinued -follows with  [name] of ID   Chronic systolic heart failure - Patient appears hypovolemic on exam.  Hold Lasix while receiving fluid - Last EF of 55 to 60%   HTN -continue amlodipine, losartan   Hyperlipidemia - continue pravastatin    DVT prophylaxis: SCD Code Status: Full Family Communication: None today Disposition Plan: Status is: Inpatient  Remains inpatient appropriate because:Inpatient level of care appropriate due to severity of illness  Dispo: The patient is from: Home              Anticipated d/c is to: SNF              Patient currently is not medically stable to d/c.   Difficult to place patient No   Level of care: Med-Surg  Consultants:  Orthopedics  Procedures:  None  Antimicrobials:  Subjective: Seen and examined.  Resting comfortably in bed.  No visible distress.  Back pain reported as moderate but improved from prior to surgery.  Objective: Vitals:   02/20/21 2017 02/20/21 2355 02/21/21 0421 02/21/21 0741  BP: 129/72 130/74 133/61 135/89  Pulse: 84 90 76 81  Resp: '20 20 18 14  '$ Temp: 98.6 F (37 C) 98.7 F (37.1 C) 97.8 F (36.6 C) 98.2 F (36.8 C)  TempSrc: Oral Oral  Oral  SpO2: 97% 98% 100% 99%  Weight:       Height:        Intake/Output Summary (Last 24 hours) at 02/21/2021 1333 Last data filed at 02/21/2021 1300 Gross per 24 hour  Intake 720 ml  Output 1900 ml  Net -1180 ml   Filed Weights   02/17/21 1235 02/19/21 0908 02/19/21 1235  Weight: 85.8 kg 93 kg 93 kg    Examination:  General exam: No acute distress.  Resting comfortably in bed. Respiratory system: Lungs clear.  Normal work of breathing.  Room air Cardiovascular system: S1-S2, RRR, no murmurs, no pedal edema Gastrointestinal system: Thin, soft, NT/ND, normal bowel sounds  Central nervous system: Alert to person and place only.  No focal deficits Extremities: Decreased power BLE.  Decreased ROM lower extremities Skin: No rashes, lesions or ulcers Psychiatry: Judgement and insight appear normal. Mood & affect appropriate.     Data Reviewed: I have personally reviewed following labs and imaging studies  CBC: Recent Labs  Lab 02/17/21 1247 02/19/21 0814  WBC 3.8* 4.8  NEUTROABS 2.0 2.4  HGB 10.8* 9.3*  HCT RESULTS UNAVAILABLE DUE TO INTERFERING SUBSTANCE 25.1*  MCV RESULTS UNAVAILABLE DUE TO INTERFERING SUBSTANCE 101.6*  PLT 142* 0000000   Basic Metabolic Panel: Recent Labs  Lab 02/17/21 1247 02/18/21 0700 02/19/21 0814 02/21/21 0441  NA 126* 126* 128* 131*  K 2.9* 2.9* 3.5 3.5  CL 85* 88* 94* 98  CO2 '30 27 26 25  '$ GLUCOSE 119* 91 106* 103*  BUN '11 9 11 9  '$ CREATININE 0.75 0.76 0.85 0.67  CALCIUM 10.0 9.4 9.5 8.9  MG  --   --  1.7  --    GFR: Estimated Creatinine Clearance: 76.8 mL/min (by C-G formula based on SCr of 0.67 mg/dL). Liver Function Tests: Recent Labs  Lab 02/17/21 1247  AST 15  ALT 15  ALKPHOS 67  BILITOT 0.5  PROT 7.2  ALBUMIN 4.0   No results for input(s): LIPASE, AMYLASE in the last 168 hours. No results for input(s): AMMONIA in the last 168 hours. Coagulation Profile: No results for input(s): INR, PROTIME in the last 168 hours. Cardiac Enzymes: No results for input(s):  CKTOTAL, CKMB, CKMBINDEX, TROPONINI in the last 168 hours. BNP (last 3 results) No results for input(s): PROBNP in the last 8760 hours. HbA1C: No results for input(s): HGBA1C in the last 72 hours. CBG: No results for input(s): GLUCAP in the last 168 hours. Lipid Profile: No results for input(s): CHOL, HDL, LDLCALC, TRIG, CHOLHDL, LDLDIRECT in the last 72 hours. Thyroid Function Tests: No results for input(s): TSH, T4TOTAL, FREET4, T3FREE, THYROIDAB in the last 72 hours. Anemia Panel: No results for input(s): VITAMINB12, FOLATE, FERRITIN, TIBC, IRON, RETICCTPCT in the last 72 hours. Sepsis Labs: No results for input(s): PROCALCITON, LATICACIDVEN in the last 168 hours.  Recent Results (from the past 240 hour(s))  SARS CORONAVIRUS 2 (TAT 6-24 HRS) Nasopharyngeal Nasopharyngeal Swab     Status: None   Collection Time: 02/17/21 12:48 PM  Specimen: Nasopharyngeal Swab  Result Value Ref Range Status   SARS Coronavirus 2 NEGATIVE NEGATIVE Final    Comment: (NOTE) SARS-CoV-2 target nucleic acids are NOT DETECTED.  The SARS-CoV-2 RNA is generally detectable in upper and lower respiratory specimens during the acute phase of infection. Negative results do not preclude SARS-CoV-2 infection, do not rule out co-infections with other pathogens, and should not be used as the sole basis for treatment or other patient management decisions. Negative results must be combined with clinical observations, patient history, and epidemiological information. The expected result is Negative.  Fact Sheet for Patients: SugarRoll.be  Fact Sheet for Healthcare Providers: https://www.woods-mathews.com/  This test is not yet approved or cleared by the [place] FDA and  has been authorized for detection and/or diagnosis of SARS-CoV-2 by FDA under an Emergency Use Authorization (EUA). This EUA will remain  in effect (meaning this test can be used) for the duration  of the COVID-19 declaration under Se ction 564(b)(1) of the Act, 21 U.S.C. section 360bbb-3(b)(1), unless the authorization is terminated or revoked sooner.  Performed at [facility], [place] [address] [place], [place] [address]   Surgical PCR screen     Status: Abnormal   Collection Time: 02/19/21 10:03 AM   Specimen: Nasal Mucosa; Nasal Swab  Result Value Ref Range Status   MRSA, PCR POSITIVE (A) NEGATIVE Final    Comment: RESULT CALLED TO, READ BACK BY AND VERIFIED WITH: BET BUJIANG,RN AT 1142 ON 02/19/21 BY GM    Staphylococcus aureus POSITIVE (A) NEGATIVE Final    Comment: (NOTE) The Xpert SA Assay (FDA approved for NASAL specimens in patients 53 years of age and older), is one component of a comprehensive surveillance program. It is not intended to diagnose infection nor to guide or monitor treatment. Performed at [facility], [address], [place], [place] [address]          Radiology Studies: DG Lumbar Spine 2-3 Views  Result Date: 02/19/2021 CLINICAL DATA:  Elective surgery.  Kyphoplasty. EXAM: DG C-ARM 1-60 MIN; LUMBAR SPINE - 2-3 VIEW FLUOROSCOPY TIME:  Fluoroscopy Time:  1 minute 32 seconds. Number of Acquired Spot Images: 2 COMPARISON:  MRI lumbar spine 02/17/2021. FINDINGS: Two C-arm fluoroscopic images were obtained intraoperatively and submitted for post operative interpretation. Transitional lumbosacral anatomy with partially lumbarized S1 segment, as described on prior MRI. Remote L1 compression fracture with near complete height loss. Interval kyphoplasty at L2 and L3 without significant extravasation. Please see the performing provider's procedural report for further detail. IMPRESSION: Intraoperative fluoroscopy, as detailed above. Electronically Signed   By: Margaretha Sheffield M.D.   On: 02/19/2021 14:40   DG C-Arm 1-60 Min  Result Date: 02/19/2021 CLINICAL DATA:  Elective surgery.  Kyphoplasty. EXAM: DG C-ARM 1-60 MIN; LUMBAR SPINE - 2-3 VIEW  FLUOROSCOPY TIME:  Fluoroscopy Time:  1 minute 32 seconds. Number of Acquired Spot Images: 2 COMPARISON:  MRI lumbar spine 02/17/2021. FINDINGS: Two C-arm fluoroscopic images were obtained intraoperatively and submitted for post operative interpretation. Transitional lumbosacral anatomy with partially lumbarized S1 segment, as described on prior MRI. Remote L1 compression fracture with near complete height loss. Interval kyphoplasty at L2 and L3 without significant extravasation. Please see the performing provider's procedural report for further detail. IMPRESSION: Intraoperative fluoroscopy, as detailed above. Electronically Signed   By: Margaretha Sheffield M.D.   On: 02/19/2021 14:40        Scheduled Meds:  amLODipine  2.5 mg Oral Daily   aspirin EC  162 mg Oral Daily   docusate sodium  100 mg Oral BID   enoxaparin (LOVENOX) injection  40 mg Subcutaneous Q24H   feeding supplement  237 mL Oral [id number]   folic acid  1 mg Oral Daily   gabapentin  1,200 mg Oral BID   losartan  50 mg Oral Daily   melatonin  5 mg Oral QHS   multivitamin with minerals  1 tablet Oral Daily   mupirocin ointment  1 application Nasal BID   pantoprazole  40 mg Oral Daily   polyethylene glycol  17 g Oral Daily   pravastatin  80 mg Oral Daily   primidone  50 mg Oral q morning   And   primidone  100 mg Oral QHS   senna-docusate  1 tablet Oral BID   cyanocobalamin  1,000 mcg Oral Daily   Continuous Infusions:     LOS: 3 days    Time spent: 25 minutes    Sidney Ace, MD [facility] Pager [phone]  If 7PM-7AM, please contact night-coverage 02/21/2021, 1:33 PM

## 2021-02-21 NOTE — Progress Notes (Signed)
  Subjective: 2 Days Post-Op Procedure(s) (LRB): KYPHOPLASTY-L2&L3 (N/A) Patient reports pain as moderate but improved from prior to surgery.  Patient is well, and has had no acute complaints or problems Plan is to go Home after hospital stay. Negative for chest pain and shortness of breath   Objective: Vital signs in last 24 hours: Temp:  [97.8 F (36.6 C)-99.1 F (37.3 C)] 98.2 F (36.8 C) (09/11 0741) Pulse Rate:  [76-99] 81 (09/11 0741) Resp:  [14-20] 14 (09/11 0741) BP: (118-138)/(61-89) 135/89 (09/11 0741) SpO2:  [97 %-100 %] 99 % (09/11 0741)  Intake/Output from previous day:  Intake/Output Summary (Last 24 hours) at 02/21/2021 1034 Last data filed at 02/21/2021 0750 Gross per 24 hour  Intake 720 ml  Output 1650 ml  Net -930 ml    Intake/Output this shift: Total I/O In: -  Out: 700 [Urine:700]  Labs: Recent Labs    02/19/21 0814  HGB 9.3*   Recent Labs    02/19/21 0814  WBC 4.8  RBC 2.47*  HCT 25.1*  PLT 204   Recent Labs    02/19/21 0814 02/21/21 0441  NA 128* 131*  K 3.5 3.5  CL 94* 98  CO2 26 25  BUN 11 9  CREATININE 0.85 0.67  GLUCOSE 106* 103*  CALCIUM 9.5 8.9   No results for input(s): LABPT, INR in the last 72 hours.   EXAM General - Patient is Alert, Appropriate, and Oriented Extremity -  able to plantarflex and dorsiflex the right foot, unable on the left but this is a pre-existing deficit Dressing/Incision -bandages intact without any drainage noted    Assessment/Plan: 2 Days Post-Op Procedure(s) (LRB): KYPHOPLASTY-L2&L3 (N/A) Principal Problem:   Compression fracture of first lumbar vertebra (HCC) Active Problems:   Essential hypertension   Chronic systolic CHF (congestive heart failure), NYHA class 2 (HCC)   Hyponatremia   Cellulitis   Lumbar compression fracture (HCC)  Estimated body mass index is 27.81 kg/m as calculated from the following:   Height as of this encounter: 6' (1.829 m).   Weight as of this  encounter: 93 kg. Advance diet Up with therapy  Plan for discharge tomorrow.      DVT Prophylaxis - Lovenox and SCDs   Cassell Smiles, PA-C [facility] Orthopaedic Surgery 02/21/2021, 10:34 AM

## 2021-02-21 NOTE — Progress Notes (Signed)
Physical Therapy Treatment Patient Details Name: [name] MRN: [id number] DOB: 16-Apr-1936 Today's Date: 02/21/2021    History of Present Illness 85 yo male with onset of back pain for a length of time had worsening pain after a fall and was admitted 9/7.  Has been mostly in a wheelchair since his lumbar soft tissue infection on 8/18.  Pt received kyphoplasty for L2 and L 3 on 9/9, compression on L1 was not corrected.  Additionally R hand third digit cellulitis and paronychia, no osteomyelitis; noted hyponatremia.  PMHx:  prostate CA with surgery, HTN, CAD, CHF, L great toe amp, MRSA bacteremia, back abscess, HLD,    PT Comments    The pt presents with great willingness to mobilize. He demonstrates improved gait distances, however is presenting with significant bilateral foot drop. The R sock with grips had to be removed during gait for safety to improved RLE step length for mobility. He reports use of bilateral AFOs at baseline. Would recommend use of AFOs for mobility with PT in order to optimize safety. PT will continue to follow.    Follow Up Recommendations  SNF     Equipment Recommendations  None recommended by PT    Recommendations for Other Services       Precautions / Restrictions      Mobility  Bed Mobility Overal bed mobility: Needs Assistance Bed Mobility: Rolling;Supine to Sit Rolling: Min assist Sidelying to sit: Min assist;HOB elevated (use of bed railing)     Sit to sidelying: Min assist General bed mobility comments: Pt requiring Max cues for spinal precautions and log roll technique. Mod A required for sit<>supine.    Transfers   Equipment used: Rolling walker (2 wheeled) Transfers: Sit to/from Stand Sit to Stand: Min assist;From elevated surface         General transfer comment: Requires anterior weight shift with upward lift to clear buttocks from elevated surface  Ambulation/Gait Ambulation/Gait assistance: Min guard Gait Distance (Feet): 30  Feet Assistive device: Rolling walker (2 wheeled) Gait Pattern/deviations: Step-to pattern;Wide base of support;Decreased dorsiflexion - left;Decreased dorsiflexion - right     General Gait Details: Dorsiflexion reduced bilaterally, R dorsiflexion is more diminished than the L. Per wife the pt using bilateral AFOs for mobility at baseline. Presenting with significant foot drop on R. Use of steppage gait in order to clear LE. R hip externally rotated during gait.   Stairs             Wheelchair Mobility    Modified Rankin (Stroke Patients Only)       Balance Overall balance assessment: Needs assistance;History of Falls   Sitting balance-Leahy Scale: Fair     Standing balance support: During functional activity Standing balance-Leahy Scale: Fair                              Cognition Arousal/Alertness: Awake/alert Behavior During Therapy: WFL for tasks assessed/performed Overall Cognitive Status: Within Functional Limits for tasks assessed                                        Exercises      General Comments        Pertinent Vitals/Pain Pain Assessment: 0-10 Pain Score: 5  Pain Location: back Pain Descriptors / Indicators: Grimacing;Guarding Pain Intervention(s): Monitored during session;Patient requesting pain meds-RN notified  Home Living                      Prior Function            PT Goals (current goals can now be found in the care plan section) Acute Rehab PT Goals Patient Stated Goal: to get back on his feet PT Goal Formulation: With patient Time For Goal Achievement: 03/06/21 Potential to Achieve Goals: Fair Progress towards PT goals: Progressing toward goals    Frequency    BID      PT Plan Current plan remains appropriate    Co-evaluation              AM-PAC PT "6 Clicks" Mobility   Outcome Measure  Help needed turning from your back to your side while in a flat bed without using  bedrails?: A Lot Help needed moving from lying on your back to sitting on the side of a flat bed without using bedrails?: A Lot Help needed moving to and from a bed to a chair (including a wheelchair)?: A Lot Help needed standing up from a chair using your arms (e.g., wheelchair or bedside chair)?: A Lot Help needed to walk in hospital room?: A Lot Help needed climbing 3-5 steps with a railing? : Total 6 Click Score: 11    End of Session Equipment Utilized During Treatment: Gait belt Activity Tolerance: Patient limited by fatigue;Patient limited by pain Patient left: in bed;with call bell/phone within reach;with bed alarm set;with nursing/sitter in room Nurse Communication: Mobility status;Patient requests pain meds PT Visit Diagnosis: Unsteadiness on feet (R26.81);Muscle weakness (generalized) (M62.81);History of falling (Z91.81)     Time: [id number] PT Time Calculation (min) (ACUTE ONLY): 23 min  Charges:  $Gait Training: 23-37 mins                     3:13 PM, 02/21/21 [name] A. Saverio Danker PT, DPT Physical Therapist - [facility]    Elza Sortor A Yeva Bissette 02/21/2021, 3:11 PM

## 2021-02-21 NOTE — Plan of Care (Signed)

## 2021-02-22 ENCOUNTER — Encounter: Payer: Self-pay | Admitting: Orthopedic Surgery

## 2021-02-22 MED ORDER — SENNOSIDES-DOCUSATE SODIUM 8.6-50 MG PO TABS: 1.0000 | ORAL_TABLET | Freq: Two times a day (BID) | ORAL | 0 refills | Status: AC

## 2021-02-22 MED ORDER — HEPARIN SOD (PORK) LOCK FLUSH 100 UNIT/ML IV SOLN
500.0000 [IU] | Freq: Once | INTRAVENOUS | Status: DC
  Filled 2021-02-22: qty 5

## 2021-02-22 MED ORDER — OXYCODONE HCL 5 MG PO TABS: 5.0000 mg | ORAL_TABLET | ORAL | 0 refills | Status: DC | PRN

## 2021-02-22 NOTE — Progress Notes (Signed)
Occupational Therapy Treatment Patient Details Name: [name] MRN: [id number] DOB: 1935/06/22 Today's Date: 02/22/2021   History of present illness 85 yo male with onset of back pain for a length of time had worsening pain after a fall and was admitted 9/7.  Has been mostly in a wheelchair since his lumbar soft tissue infection on 8/18.  Pt received kyphoplasty for L2 and L 3 on 9/9, compression on L1 was not corrected.  Additionally R hand third digit cellulitis and paronychia, no osteomyelitis; noted hyponatremia.  PMHx:  prostate CA with surgery, HTN, CAD, CHF, L great toe amp, MRSA bacteremia, back abscess, HLD,   OT comments  Pt up in chair upon OT arrival.  Spouse who was sharing a room with pt was transported home within the hour, and pt is expecting to leave this afternoon as well with full time caregivers in place.  Pt participated in functional mobility within room using RW and CGA; declined need to use the bathroom.  Returned to chair for instruction in use of reacher and sockaid for LB dressing.  OT reviewed spinal precautions and advised on options to obtain AE if desired.  Pt used reacher/sockaid to doff/donn socks with mod vc and min A.  All LB dressing tasks more likely to be mod A when underwear and pants are included.  Pt noted to have large hematoma to R posterior calf, which he reports is new from resting his legs on the recliner leg rest today.  Nursing notified and applied bandage.  Pt left sitting up in chair with all needs met, chair alarm on, and call light/phone within reach.    Recommendations for follow up therapy are one component of a multi-disciplinary discharge planning process, led by the attending physician.  Recommendations may be updated based on patient status, additional functional criteria and insurance authorization.    Follow Up Recommendations  SNF;Supervision/Assistance - 24 hour    Equipment Recommendations  Hospital bed          Precautions /  Restrictions Precautions Precautions: Fall;Back Precaution Booklet Issued: No Precaution Comments: Spinal Precautions Restrictions Weight Bearing Restrictions: No Other Position/Activity Restrictions: spinal precautions       Mobility Bed Mobility Overal bed mobility: Needs Assistance Bed Mobility: Sit to Supine       Sit to supine: Min assist;HOB elevated   General bed mobility comments: Verbal cues for usage of log roll technique however, pt attempting to move B LEs off EOB and bringing his trunk forward in one motion with HOB elevated. Pt requiring assistance with B LEs back into bed to maintain spinal precautions Patient Response: Cooperative  Transfers Overall transfer level: Needs assistance Equipment used: Rolling walker (2 wheeled) Transfers: Sit to/from Stand Sit to Stand: Min assist         General transfer comment: Able to scoot to edge of recliner with BUE support and feet supported on floor, min A sit to stand from recliner    Balance Overall balance assessment: Needs assistance;History of Falls Sitting-balance support: Feet supported Sitting balance-Leahy Scale: Fair Sitting balance - Comments: No LOB sitting EOB for LB dressing tasks   Standing balance support: Bilateral upper extremity supported Standing balance-Leahy Scale: Fair Standing balance comment: Requiring CGA to maintain balance with static and dynamic standing balance                           ADL either performed or assessed with clinical judgement   ADL  Overall ADL's : Needs assistance/impaired                     Lower Body Dressing: Moderate assistance;With adaptive equipment Lower Body Dressing Details (indicate cue type and reason): reacher and sockaid to don/doff socks             Functional mobility during ADLs: Min guard;Rolling walker General ADL Comments: ambulated from recliner to door and back, declined need to use bathroom     Vision Patient  Visual Report: No change from baseline                Cognition Arousal/Alertness: Awake/alert Behavior During Therapy: WFL for tasks assessed/performed Overall Cognitive Status: Within Functional Limits for tasks assessed                                 General Comments: A&O x4        Exercises Other Exercises Other Exercises: Sitting: Marching 2 x 10, LAQ 2 x 10, Heel raises 1 x 10 (Decreased on L ankle compared to R ankle), attempted seated DF however, pt unable.          General Comments New hematoma to R posterior calf; pt reports it's from resting legs on legs rests of recliner (SN notified and brought bandage to cover)    Pertinent Vitals/ Pain       Pain Assessment: Faces Pain Score: 5  Faces Pain Scale: Hurts little more Pain Location: Low back Pain Descriptors / Indicators: Grimacing;Guarding Pain Intervention(s): Limited activity within patient's tolerance;Repositioned                                                          Frequency  Min 2X/week        Progress Toward Goals  OT Goals(current goals can now be found in the care plan section)  Progress towards OT goals: Progressing toward goals  Acute Rehab OT Goals Patient Stated Goal: to get back on his feet OT Goal Formulation: With patient Time For Goal Achievement: 03/06/21 Potential to Achieve Goals: Good  Plan Discharge plan remains appropriate    Co-evaluation                 AM-PAC OT "6 Clicks" Daily Activity     Outcome Measure     Help from another person taking care of personal grooming?: A Little Help from another person toileting, which includes using toliet, bedpan, or urinal?: A Little Help from another person bathing (including washing, rinsing, drying)?: A Lot Help from another person to put on and taking off regular upper body clothing?: A Little Help from another person to put on and taking off regular lower body clothing?: A  Lot 6 Click Score: 13    End of Session Equipment Utilized During Treatment: Rolling walker;Gait belt  OT Visit Diagnosis: Muscle weakness (generalized) (M62.81);History of falling (Z91.81)   Activity Tolerance Patient tolerated treatment well   Patient Left in chair;with call bell/phone within reach;with chair alarm set   Nurse Communication Mobility status;Other (comment) (see above for skin integrity to R posterior calf)        Time: 8341-9622 OT Time Calculation (min): 28 min  Charges: OT General Charges $OT Visit: 1  Visit OT Treatments $Self Care/Home Management : 23-37 mins  Leta Speller, MS, OTR/L   Darleene Cleaver 02/22/2021, 3:06 PM

## 2021-02-22 NOTE — Discharge Instructions (Signed)
Diet: As you were doing prior to hospitalization   Shower: You may remove band-aid and shower, allow dermabond/glue to come off on its own  Activity:  Increase activity slowly as tolerated, but follow the weight bearing instructions below.  Weight Bearing:   Weight bearing as tolerated   To prevent constipation: you may use a stool softener such as -  Colace (over the counter) 100 mg by mouth twice a day  Drink plenty of fluids (prune juice may be helpful) and high fiber foods Miralax (over the counter) for constipation as needed.    Itching:  If you experience itching with your medications, try taking only a single pain pill, or even half a pain pill at a time.  You may take up to 10 pain pills per day, and you can also use benadryl over the counter for itching or also to help with sleep.   Precautions:  If you experience chest pain or shortness of breath - call 911 immediately for transfer to the hospital emergency department!!  If you develop a fever greater that 101 F, purulent drainage from wound, increased redness or drainage from wound, or calf pain-Call [facility]                                               Follow- Up Appointment:  Please call for an appointment to be seen in 2 weeks at [facility]

## 2021-02-22 NOTE — Progress Notes (Signed)
   Subjective: 3 Days Post-Op Procedure(s) (LRB): KYPHOPLASTY-L2&L3 (N/A) Patient reports pain as mild.   Patient is well, and has had no acute complaints or problems. Back pain improving. Denies any CP, SOB, ABD pain. We will continue therapy today.   Objective: Vital signs in last 24 hours: Temp:  [97.6 F (36.4 C)-98.9 F (37.2 C)] 98.2 F (36.8 C) (09/12 0503) Pulse Rate:  [75-91] 75 (09/12 0503) Resp:  [16-18] 18 (09/12 0503) BP: (117-142)/(74-84) 123/74 (09/12 0503) SpO2:  [97 %-100 %] 97 % (09/12 0503)  Intake/Output from previous day: 09/11 0701 - 09/12 0700 In: 240 [P.O.:240] Out: 1775 [Urine:1775] Intake/Output this shift: No intake/output data recorded.  Recent Labs    02/19/21 0814  HGB 9.3*   Recent Labs    02/19/21 0814  WBC 4.8  RBC 2.47*  HCT 25.1*  PLT 204   Recent Labs    02/19/21 0814 02/21/21 0441  NA 128* 131*  K 3.5 3.5  CL 94* 98  CO2 26 25  BUN 11 9  CREATININE 0.85 0.67  GLUCOSE 106* 103*  CALCIUM 9.5 8.9   No results for input(s): LABPT, INR in the last 72 hours.  EXAM General - Patient is Alert, Appropriate, and Oriented Lumbar spine - no spinous process tenderness. No swelling warmth or redness Dressing - dressing C/D/I and no drainage, new bandaid applied to lower back Motor Function - intact, moving foot and toes well on exam.   Past Medical History:  Diagnosis Date   Arthritis    Benign essential tremor    Fracture, femur ([place]) 2012   GERD (gastroesophageal reflux disease)    H/O echocardiogram    05/2013   Hx of colonoscopy    04/22/2013   Hypercholesterolemia    Hypertension    Myocardial infarction ([place])    1987   Polyneuropathy    Prostate cancer ([place]) 2004   Superficial hematoma     Assessment/Plan:   3 Days Post-Op Procedure(s) (LRB): KYPHOPLASTY-L2&L3 (N/A) Principal Problem:   Compression fracture of first lumbar vertebra (HCC) Active Problems:   Essential hypertension   Chronic systolic CHF  (congestive heart failure), NYHA class 2 (HCC)   Hyponatremia   Cellulitis   Lumbar compression fracture (HCC)  Estimated body mass index is 27.81 kg/m as calculated from the following:   Height as of this encounter: 6' (1.829 m).   Weight as of this encounter: 93 kg. Advance diet Up with therapy Pain well controlled Follow up with [place] [facility] in 2 weeks   T. Rachelle Hora, PA-C [place] 02/22/2021, 8:04 AM

## 2021-02-22 NOTE — Discharge Summary (Signed)
Physician Discharge Summary  [name] [id number] DOB: [birth year] DOA: 02/17/2021  PCP: [name], DO  Admit date: 02/17/2021 Discharge date: 02/22/2021  Admitted From: Home Disposition: Home with home health services and 24/7 care  Recommendations for Outpatient Follow-up:  Follow up with PCP in 1-2 weeks Follow-up as directed with orthopedics  Home Health: Yes Equipment/Devices: No  Discharge Condition: Stable CODE STATUS: Full Diet recommendation: Regular  Brief/Interim Summary:  85 y.o. male with medical history significant for remote history of prostate cancer s/p prostatectomy, hypertension, CAD, chronic systolic heart failure, s/p left great toe MTP amputation, hx of MRSA bacteremia who presents with worsening back pain following a recent fall.   Patient had MRSA bacteremia and left foot infection in February requiring IV daptomycin for 2 weeks followed by p.o. linezolid.  He then later had lumbar soft tissue infection with I&D on 01/28/2021 he was treated with p.o. linezolid and p.o. rifampin.  Since then patient has been wheelchair-bound.  States about 2-1/2 weeks ago he fell out of his wheelchair landing on his right hip since it was not locked.  Has been experiencing severe back pain around lumbar spine.  Not taking anything for pain at home.  His wife was recently admitted here for a hip fracture and since then he has been laying at home with no one to help him get out of bed in the past few days.  Has not had anything to drink.  He finally called his doctor who sent EMS to bring him to the ED.  He endorsed some numbness and tingling of the right anterior thigh.  Denies any saddle anesthesia.  No urinary or bowel incontinent.   On presentation MRI imaging survey demonstrated lumbar vertebral disc disease associated with compression fracture of L2 and L3 vertebra.  Status post L2-L3 kyphoplasty with orthopedic surgery.  Physical therapy has evaluated.  Plan to  discharge home with home health Monday 9/12.    Discharge Diagnoses:  Principal Problem:   Compression fracture of first lumbar vertebra (HCC) Active Problems:   Essential hypertension   Chronic systolic CHF (congestive heart failure), NYHA class 2 (HCC)   Hyponatremia   Cellulitis   Lumbar compression fracture (HCC)  Acute compression fracture of the L2 and L3 vertebral body with height loss Severe chronic compression deformity at L1 Dr. Rudene Christians consulted Status post L2-L3 kyphoplasty Plan: Discharge home with home health services and 24/7 care.  Continue therapy evaluations at home.  Pain medication prescribed.  Follow-up outpatient PCP and orthopedics     Hyponatremia Unclear chronicity.  Improving with IV isotonic fluids IV fluids stopped 9/10    Paronychia/cellulitis of the third digit of the right hand recent MRSA back abscess Patient does not recall mechanism of injury.  Wound was wrapped up by neighbor. Right hand x-ray negative for osteomyelitis Plan: Discussed with ID.  No further indication for antibiotics.  Discontinue Zyvox and rifampin   recent MRSA back abscess -Zyvox and rifampin discontinued -follows with  [name] of ID   Chronic systolic heart failure - Patient appears hypovolemic on exam.  Hold Lasix while receiving fluid - Last EF of 55 to 60%   HTN -continue amlodipine, losartan   Hyperlipidemia - continue pravastatin   Discharge Instructions  Discharge Instructions     Diet - low sodium heart healthy   Complete by: As directed    Increase activity slowly   Complete by: As directed    No wound care   Complete by: As  directed       Allergies as of 02/22/2021       Reactions   Codeine Nausea And Vomiting   Indocin [indomethacin] Other (See Comments)   Latex Other (See Comments)   Blisters   Mupirocin    Blisters   Plavix [clopidogrel Bisulfate]    GI intolerance, n/v   Polysporin [bacitracin-polymyxin B]    Blisters    Shellfish Allergy Nausea And Vomiting   Mussels   Tape    Neosporin [neomycin-bacitracin Zn-polymyx] Rash   Other Nausea And Vomiting   mussels        Medication List     STOP taking these medications    HYDROcodone-acetaminophen 5-325 MG tablet Commonly known as: NORCO/VICODIN   linezolid 600 MG tablet Commonly known as: ZYVOX   rifampin 300 MG capsule Commonly known as: RIFADIN       TAKE these medications    Alpha Lipoic Acid 200 MG Caps Take 2 capsules by mouth daily.   amLODipine 2.5 MG tablet Commonly known as: NORVASC TAKE 1 TABLET BY MOUTH  DAILY   Apoaequorin 10 MG Caps Take 1 capsule by mouth as directed.   aspirin 81 MG tablet Take 162 mg by mouth at bedtime.   Azelastine HCl 137 MCG/SPRAY Soln PLACE 1 SPRAY INTO BOTH NOSTRILS 2 (TWO) TIMES DAILY. USE IN EACH NOSTRIL AS DIRECTED   Biotin 1000 MCG tablet Take 1,000 mcg by mouth daily.   Calcium Carb-Cholecalciferol 500-100 MG-UNIT Chew Chew 500 mg by mouth daily.   cyanocobalamin 1000 MCG tablet Take 1,000 mcg by mouth daily.   diphenhydrAMINE 25 MG tablet Commonly known as: BENADRYL Take 25 mg by mouth at bedtime as needed for allergies.   folic acid 1 MG tablet Commonly known as: FOLVITE Take 1 mg by mouth daily.   furosemide 20 MG tablet Commonly known as: LASIX TAKE 1 TABLET BY MOUTH  DAILY   gabapentin 600 MG tablet Commonly known as: NEURONTIN TAKE 2 TABLETS BY MOUTH  TWICE DAILY   gentamicin cream 0.1 % Commonly known as: GARAMYCIN APPLY TO AFFECTED AREA 1 TIMES DAILY   Glucosamine Sulfate 1000 MG Caps Take 1 capsule by mouth daily.   Hibiclens 4 % external liquid Generic drug: chlorhexidine Apply topically daily. Use to wash your body below neck   Lecithin 400 MG Caps Take 400 mg by mouth daily.   losartan 50 MG tablet Commonly known as: COZAAR Take 50 mg by mouth daily.   melatonin 5 MG Tabs Take 5 mg by mouth at bedtime.   Omega 3 1000 MG Caps Take 1,000  mg by mouth daily.   omeprazole 20 MG capsule Commonly known as: PRILOSEC TAKE 1 CAPSULE BY MOUTH  TWICE DAILY BEFORE MEALS   oxyCODONE 5 MG immediate release tablet Commonly known as: Oxy IR/ROXICODONE Take 1 tablet (5 mg total) by mouth every 4 (four) hours as needed for moderate pain.   potassium chloride SA 20 MEQ tablet Commonly known as: KLOR-CON Take 1 tablet (20 mEq total) by mouth 2 (two) times daily.   pravastatin 80 MG tablet Commonly known as: PRAVACHOL TAKE 1 TABLET BY MOUTH  DAILY   primidone 50 MG tablet Commonly known as: MYSOLINE Take 1-2 tablets (50-100 mg total) by mouth See admin instructions. Take 2 tablets ('100mg'$ ) by mouth every morning and take 1 tablet ('50mg'$ ) by mouth every night   promethazine 12.5 MG tablet Commonly known as: PHENERGAN Take 1-2 tablets (12.5-25 mg total) by mouth every 8 (eight)  hours as needed for nausea or vomiting. Caution sedation.   Selenium 200 MCG Caps Take 200 mcg by mouth daily.   senna-docusate 8.6-50 MG tablet Commonly known as: Senokot-S Take 1 tablet by mouth 2 (two) times daily for 7 days.   tretinoin 0.05 % cream Commonly known as: RETIN-A Apply 1 application topically at bedtime.   triamcinolone cream 0.1 % Commonly known as: KENALOG Apply 1 application topically 2 (two) times daily. (apply to hands, legs and feet)   VITAMIN-B COMPLEX PO Take 1 tablet by mouth daily.               Durable Medical Equipment  (From admission, onward)           Start     Ordered   02/22/21 0904  For home use only DME Hospital bed  Once       Question Answer Comment  Length of Need 6 Months   Patient has (list medical condition): Compression Fracture,   The above medical condition requires: Patient requires the ability to reposition frequently   Head must be elevated greater than: 30 degrees   Bed type Semi-electric   Trapeze Bar Yes   Support Surface: Gel Overlay      02/22/21 [id number]            Follow-up  Information     Duanne Guess, PA-C. Go on 03/05/2021.   Specialties: Orthopedic Surgery, Emergency Medicine Why: Appt @ 10:45 am Contact information: [place] [id number] [phone]         [name], DO. Go on 03/03/2021.   Specialty: Family Medicine Why: Appt @ 1:40 pm Contact information: [address] [place] [address] [phone]                Allergies  Allergen Reactions   Codeine Nausea And Vomiting   Indocin [Indomethacin] Other (See Comments)   Latex Other (See Comments)    Blisters   Mupirocin     Blisters   Plavix [Clopidogrel Bisulfate]     GI intolerance, n/v   Polysporin [Bacitracin-Polymyxin B]     Blisters   Shellfish Allergy Nausea And Vomiting    Mussels   Tape    Neosporin [Neomycin-Bacitracin Zn-Polymyx] Rash   Other Nausea And Vomiting    mussels    Consultations: Orthopedics   Procedures/Studies: DG Lumbar Spine 2-3 Views  Result Date: 02/19/2021 CLINICAL DATA:  Elective surgery.  Kyphoplasty. EXAM: DG C-ARM 1-60 MIN; LUMBAR SPINE - 2-3 VIEW FLUOROSCOPY TIME:  Fluoroscopy Time:  1 minute 32 seconds. Number of Acquired Spot Images: 2 COMPARISON:  MRI lumbar spine 02/17/2021. FINDINGS: Two C-arm fluoroscopic images were obtained intraoperatively and submitted for post operative interpretation. Transitional lumbosacral anatomy with partially lumbarized S1 segment, as described on prior MRI. Remote L1 compression fracture with near complete height loss. Interval kyphoplasty at L2 and L3 without significant extravasation. Please see the performing provider's procedural report for further detail. IMPRESSION: Intraoperative fluoroscopy, as detailed above. Electronically Signed   By: Margaretha Sheffield M.D.   On: 02/19/2021 14:40   DG Lumbar Spine 2-3 Views  Result Date: 02/17/2021 CLINICAL DATA:  Pain for 2 weeks after fall. Slight out of wheelchair. EXAM: LUMBAR SPINE - 2-3 VIEW COMPARISON:  None  available. FINDINGS: Please note patient has transitional lumbosacral anatomy with suspected lumbarization of S1. The uppermost non-rib-bearing vertebra will be labeled L1. Severe L1 compression fracture with near complete loss of height centrally. There  is slight buckling of the posterior cortex with focal kyphosis at this level. Moderate L3 compression fracture with approximately 25% loss of height. There is minimal cortical buckling. The bones are diffusely under mineralized. Multilevel degenerative disc disease most prominent at L4-L5 and L5-S1. Facet hypertrophy from L3-L4 through the lumbosacral junction. IMPRESSION: 1. Severe L1 and moderate L3 compression fractures, age indeterminate. 2. Multilevel degenerative change throughout the lumbar spine. 3. 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 The upper most lumbar vertebra is labeled L1 for numbering purposes. Electronically Signed   By: Keith Rake M.D.   On: 02/17/2021 17:28   MR LUMBAR SPINE WO CONTRAST  Result Date: 02/17/2021 CLINICAL DATA:  Initial evaluation for acute compression fracture. EXAM: MRI LUMBAR SPINE WITHOUT CONTRAST TECHNIQUE: Multiplanar, multisequence MR imaging of the lumbar spine was performed. No intravenous contrast was administered. COMPARISON:  Radiograph from earlier the same day. FINDINGS: Segmentation: Transitional lumbosacral anatomy with a partially lumbarized S1 segment and rudimentary S1-2 interspace. Alignment: Mild levoscoliosis. Alignment otherwise normal with preservation of the normal lumbar lordosis. Vertebrae: There is an acute compression fracture extending through the inferior endplate of L2. Associated height loss is relatively mild measuring approximately 15% without significant bony retropulsion. There is an additional acute compression fracture involving the superior endplate of L3. Associated height loss measures up to 20% without significant bony  retropulsion. These are benign/mechanical in appearance. Severe chronic compression deformity with near complete height loss and vertebral plana formation noted at L1, chronic in appearance without significant residual marrow edema. Associated up to 5 mm bony retropulsion at the posterior/inferior aspect of the collapsed L1 vertebral body. Otherwise, vertebral body height maintained. Underlying bone marrow signal intensity is diffusely heterogeneous, likely reflecting osteoporosis/osteopenia. No worrisome osseous lesions. Additional mild discogenic reactive endplate change noted about the right aspect of the L4-5 interspace. No other abnormal marrow edema. Conus medullaris and cauda equina: Conus extends to the L1 level. Visualized conus medullaris is within normal limits. Nerve roots of the cauda equina are diffusely irregular and undulating related to underlying spinal stenosis, largely due to prominent epidural lipomatosis. Paraspinal and other soft tissues: Mild edema within the psoas musculature bilaterally adjacent to the acute compression fractures, right slightly worse than left. Additional mild edema within the lower posterior paraspinous musculature, suggesting associated muscular injury/strain. Underlying chronic atrophic changes noted throughout the visualized musculature. Few tiny simple cysts noted within the partially visualized right kidney. Visualized kidneys themselves are somewhat atrophic. Disc levels: T12-L1: Mild disc bulge with disc desiccation. Mild facet hypertrophy. No significant spinal stenosis. Foramina remain patent. L1-2: Up to 5 mm bony retropulsion related to the chronic L1 compression fracture. Superimposed disc bulge with disc desiccation with mild to moderate facet hypertrophy. Prominence of the dorsal epidural fat. Resultant mild-to-moderate spinal stenosis. Mild-to-moderate bilateral L1 foraminal narrowing. L2-3: Disc bulge with disc desiccation. Mild-to-moderate bilateral facet  hypertrophy. Prominent epidural lipomatosis with resultant moderate spinal stenosis. Moderate right with mild left L2 foraminal narrowing. L3-4: Degenerative intervertebral disc space narrowing with mild diffuse disc bulge. Associated reactive endplate spurring. Moderate facet and ligament flavum hypertrophy. Epidural lipomatosis. Resultant moderate to severe spinal stenosis, with the thecal sac measuring 5 mm in AP diameter, largely due to epidural lipomatosis. Mild left with moderate right L3 foraminal narrowing. L4-5: Degenerative intervertebral disc space narrowing with diffuse disc bulge and reactive endplate change. Superimposed left extraforaminal disc protrusion contacts the exiting left L4 nerve root (series 9, image 25). Moderate facet hypertrophy. Circumferential  epidural lipomatosis. Resultant moderate spinal stenosis, largely due to epidural fat. Mild right with moderate left L4 foraminal narrowing. L5-S1: Degenerative intervertebral disc space narrowing with diffuse disc bulge, disc desiccation, and reactive endplate spurring. Moderate facet hypertrophy. Mild epidural lipomatosis. No significant spinal stenosis. Moderate left worse than right L5 foraminal narrowing. S1-2: Transitional lumbosacral anatomy with rudimentary S1-2 interspace. No disc bulge or focal disc herniation. Mild to moderate facet hypertrophy. No spinal stenosis. Foramina remain patent. IMPRESSION: 1. Acute compression fractures involving the L2 and L3 vertebral bodies with up to 15-20% height loss as above, but no significant bony retropulsion. These are benign/mechanical in appearance. 2. Diffuse edema within the psoas and posterior paraspinous musculature, suggesting a degree of associated muscular injury/strain. 3. Severe chronic compression deformity at L1 with near complete height loss. 4. Multilevel spondylosis with diffuse epidural lipomatosis throughout the lumbar spine, resulting in moderate to severe diffuse spinal  stenosis, most pronounced at L3-4. Electronically Signed   By: Jeannine Boga M.D.   On: 02/17/2021 23:31   DG Hand 2 View Right  Result Date: 02/18/2021 CLINICAL DATA:  Wound at nailbed of right middle finger. Cellulitis. EXAM: RIGHT HAND - 2 VIEW COMPARISON:  None. FINDINGS: Advanced degenerative changes in the IP joints, most pronounced in the DIP joints as well as the 1st carpometacarpal joint. No acute bony abnormality. Specifically, no fracture, subluxation, or dislocation. No bone destruction to suggest osteomyelitis. Small radiopaque foreign body within the soft tissues in the right index finger. No soft tissue gas. IMPRESSION: No acute bony abnormality.  No evidence of osteomyelitis. Electronically Signed   By: Rolm Baptise M.D.   On: 02/18/2021 00:56   CT Hip Right Wo Contrast  Result Date: 02/17/2021 CLINICAL DATA:  Right hip pain but negative conventional radiographic workup. EXAM: CT OF THE RIGHT HIP WITHOUT CONTRAST TECHNIQUE: Multidetector CT imaging of the right hip was performed according to the standard protocol. Multiplanar CT image reconstructions were also generated. COMPARISON:  Radiographs 02/17/2021 FINDINGS: Bones/Joint/Cartilage No acute fracture is identified. There is spurring of the acetabulum, femoral head, greater trochanter, and along portions of the femoral neck most compatible with degenerative findings. I do not see a well-defined fracture plane. MRI can be more sensitive in detecting stress injuries of bony structures if head degree of suspicion persists or if the patient is unable to bear weight. No hip joint effusion Ligaments Suboptimally assessed by CT. Muscles and Tendons No significant regional muscular hematoma. Expected contour of the hamstring tendon. No abnormal stranding within or along the musculature. Soft tissues Arterial atherosclerosis noted. Prostatectomy. High density along the right groin skin crease likely from talcum patter or similar. No right  sciatic impingement identified. Mild subcutaneous edema lateral to the greater trochanter may represent some regional bruising. IMPRESSION: 1. Potential bruising in the subcutaneous tissues overlying the greater trochanter, but no visible fracture. Degenerative findings in the hip noted. MRI does have a higher sensitivity for subtle fractures and stress fractures, if there continues to be high clinical suspicion. 2. Atherosclerosis. 3. Prostatectomy. Electronically Signed   By: Van Clines M.D.   On: 02/17/2021 14:40   DG C-Arm 1-60 Min  Result Date: 02/19/2021 CLINICAL DATA:  Elective surgery.  Kyphoplasty. EXAM: DG C-ARM 1-60 MIN; LUMBAR SPINE - 2-3 VIEW FLUOROSCOPY TIME:  Fluoroscopy Time:  1 minute 32 seconds. Number of Acquired Spot Images: 2 COMPARISON:  MRI lumbar spine 02/17/2021. FINDINGS: Two C-arm fluoroscopic images were obtained intraoperatively and submitted for post operative interpretation. Transitional lumbosacral  anatomy with partially lumbarized S1 segment, as described on prior MRI. Remote L1 compression fracture with near complete height loss. Interval kyphoplasty at L2 and L3 without significant extravasation. Please see the performing provider's procedural report for further detail. IMPRESSION: Intraoperative fluoroscopy, as detailed above. Electronically Signed   By: Margaretha Sheffield M.D.   On: 02/19/2021 14:40   DG Hip Unilat  With Pelvis 2-3 Views Right  Result Date: 02/17/2021 CLINICAL DATA:  Fall, right hip pain EXAM: DG HIP (WITH OR WITHOUT PELVIS) 2-3V RIGHT COMPARISON:  02/03/2021 FINDINGS: Osteopenia. There is no evidence of hip fracture or dislocation. There is no evidence of arthropathy or other focal bone abnormality. Surgical clips about the low pelvis. IMPRESSION: Osteopenia. No displaced fracture or dislocation of the right hip. Joint space is preserved. Please note that plain radiographs are significantly insensitive for hip and pelvic fracture. Recommend CT or  MRI to most sensitively evaluate if there is high clinical suspicion for fracture. Electronically Signed   By: Eddie Candle M.D.   On: 02/17/2021 13:48   DG Hip Unilat W OR W/O Pelvis 2-3 Views Right  Result Date: 02/03/2021 CLINICAL DATA:  Status post fall with right hip pain. Fall in July with persistent right hip pain since that time. EXAM: DG HIP (WITH OR WITHOUT PELVIS) 2-3V RIGHT COMPARISON:  None. FINDINGS: No acute or healing fracture. The femoral head is well seated in the acetabulum. There is mild lateral acetabular spurring. No avascular necrosis. Intact pubic rami. Pubic symphysis and sacroiliac joints are congruent. No focal bone lesion. Surgical clips in the pelvis from prostatectomy. IMPRESSION: Mild osteoarthritis of the right hip. No acute or healing fracture. Electronically Signed   By: Keith Rake M.D.   On: 02/03/2021 21:58   (Echo, Carotid, EGD, Colonoscopy, ERCP)    Subjective: Seen and examined on the day of discharge.  Resting in bed.  No visible distress.  Stable for discharge home with home health services.  Discharge Exam: Vitals:   02/22/21 0503 02/22/21 0846  BP: 123/74 (!) 145/84  Pulse: 75 72  Resp: 18 14  Temp: 98.2 F (36.8 C) 98.9 F (37.2 C)  SpO2: 97% 100%   Vitals:   02/21/21 1549 02/21/21 2009 02/22/21 0503 02/22/21 0846  BP: 117/78 136/78 123/74 (!) 145/84  Pulse: 91 82 75 72  Resp: '18 18 18 14  '$ Temp: 97.6 F (36.4 C) 98.4 F (36.9 C) 98.2 F (36.8 C) 98.9 F (37.2 C)  TempSrc:  Oral Oral Oral  SpO2: 100% 99% 97% 100%  Weight:      Height:        General: Pt is alert, awake, not in acute distress Cardiovascular: RRR, S1/S2 +, no rubs, no gallops Respiratory: CTA bilaterally, no wheezing, no rhonchi Abdominal: Soft, NT, ND, bowel sounds + Extremities: no edema, no cyanosis    The results of significant diagnostics from this hospitalization (including imaging, microbiology, ancillary and laboratory) are listed below for  reference.     Microbiology: Recent Results (from the past 240 hour(s))  SARS CORONAVIRUS 2 (TAT 6-24 HRS) Nasopharyngeal Nasopharyngeal Swab     Status: None   Collection Time: 02/17/21 12:48 PM   Specimen: Nasopharyngeal Swab  Result Value Ref Range Status   SARS Coronavirus 2 NEGATIVE NEGATIVE Final    Comment: (NOTE) SARS-CoV-2 target nucleic acids are NOT DETECTED.  The SARS-CoV-2 RNA is generally detectable in upper and lower respiratory specimens during the acute phase of infection. Negative results do not preclude SARS-CoV-2  infection, do not rule out co-infections with other pathogens, and should not be used as the sole basis for treatment or other patient management decisions. Negative results must be combined with clinical observations, patient history, and epidemiological information. The expected result is Negative.  Fact Sheet for Patients: SugarRoll.be  Fact Sheet for Healthcare Providers: https://www.woods-mathews.com/  This test is not yet approved or cleared by the [place] FDA and  has been authorized for detection and/or diagnosis of SARS-CoV-2 by FDA under an Emergency Use Authorization (EUA). This EUA will remain  in effect (meaning this test can be used) for the duration of the COVID-19 declaration under Se ction 564(b)(1) of the Act, 21 U.S.C. section 360bbb-3(b)(1), unless the authorization is terminated or revoked sooner.  Performed at [facility], [place] [address] [place], [place] [address]   Surgical PCR screen     Status: Abnormal   Collection Time: 02/19/21 10:03 AM   Specimen: Nasal Mucosa; Nasal Swab  Result Value Ref Range Status   MRSA, PCR POSITIVE (A) NEGATIVE Final    Comment: RESULT CALLED TO, READ BACK BY AND VERIFIED WITH: BET BUJIANG,RN AT 1142 ON 02/19/21 BY GM    Staphylococcus aureus POSITIVE (A) NEGATIVE Final    Comment: (NOTE) The Xpert SA Assay (FDA approved for NASAL  specimens in patients 60 years of age and older), is one component of a comprehensive surveillance program. It is not intended to diagnose infection nor to guide or monitor treatment. Performed at [facility], [address] [place], [place] [address]      Labs: BNP (last 3 results) Recent Labs    04/22/20 1557  BNP 0000000*   Basic Metabolic Panel: Recent Labs  Lab 02/17/21 1247 02/18/21 0700 02/19/21 0814 02/21/21 0441  NA 126* 126* 128* 131*  K 2.9* 2.9* 3.5 3.5  CL 85* 88* 94* 98  CO2 '30 27 26 25  '$ GLUCOSE 119* 91 106* 103*  BUN '11 9 11 9  '$ CREATININE 0.75 0.76 0.85 0.67  CALCIUM 10.0 9.4 9.5 8.9  MG  --   --  1.7  --    Liver Function Tests: Recent Labs  Lab 02/17/21 1247  AST 15  ALT 15  ALKPHOS 67  BILITOT 0.5  PROT 7.2  ALBUMIN 4.0   No results for input(s): LIPASE, AMYLASE in the last 168 hours. No results for input(s): AMMONIA in the last 168 hours. CBC: Recent Labs  Lab 02/17/21 1247 02/19/21 0814  WBC 3.8* 4.8  NEUTROABS 2.0 2.4  HGB 10.8* 9.3*  HCT RESULTS UNAVAILABLE DUE TO INTERFERING SUBSTANCE 25.1*  MCV RESULTS UNAVAILABLE DUE TO INTERFERING SUBSTANCE 101.6*  PLT 142* 204   Cardiac Enzymes: No results for input(s): CKTOTAL, CKMB, CKMBINDEX, TROPONINI in the last 168 hours. BNP: Invalid input(s): POCBNP CBG: No results for input(s): GLUCAP in the last 168 hours. D-Dimer No results for input(s): DDIMER in the last 72 hours. Hgb A1c No results for input(s): HGBA1C in the last 72 hours. Lipid Profile No results for input(s): CHOL, HDL, LDLCALC, TRIG, CHOLHDL, LDLDIRECT in the last 72 hours. Thyroid function studies No results for input(s): TSH, T4TOTAL, T3FREE, THYROIDAB in the last 72 hours.  Invalid input(s): FREET3 Anemia work up No results for input(s): VITAMINB12, FOLATE, FERRITIN, TIBC, IRON, RETICCTPCT in the last 72 hours. Urinalysis    Component Value Date/Time   COLORURINE YELLOW (A) 02/17/2021 1710    APPEARANCEUR CLEAR (A) 02/17/2021 1710   LABSPEC 1.015 02/17/2021 1710   PHURINE  7.5 02/17/2021 1710   GLUCOSEU NEGATIVE 02/17/2021 1710   HGBUR TRACE (A) 02/17/2021 1710   BILIRUBINUR NEGATIVE 02/17/2021 1710   KETONESUR NEGATIVE 02/17/2021 1710   PROTEINUR NEGATIVE 02/17/2021 1710   NITRITE NEGATIVE 02/17/2021 1710   LEUKOCYTESUR NEGATIVE 02/17/2021 1710   Sepsis Labs Invalid input(s): PROCALCITONIN,  WBC,  LACTICIDVEN Microbiology Recent Results (from the past 240 hour(s))  SARS CORONAVIRUS 2 (TAT 6-24 HRS) Nasopharyngeal Nasopharyngeal Swab     Status: None   Collection Time: 02/17/21 12:48 PM   Specimen: Nasopharyngeal Swab  Result Value Ref Range Status   SARS Coronavirus 2 NEGATIVE NEGATIVE Final    Comment: (NOTE) SARS-CoV-2 target nucleic acids are NOT DETECTED.  The SARS-CoV-2 RNA is generally detectable in upper and lower respiratory specimens during the acute phase of infection. Negative results do not preclude SARS-CoV-2 infection, do not rule out co-infections with other pathogens, and should not be used as the sole basis for treatment or other patient management decisions. Negative results must be combined with clinical observations, patient history, and epidemiological information. The expected result is Negative.  Fact Sheet for Patients: SugarRoll.be  Fact Sheet for Healthcare Providers: https://www.woods-mathews.com/  This test is not yet approved or cleared by the [place] FDA and  has been authorized for detection and/or diagnosis of SARS-CoV-2 by FDA under an Emergency Use Authorization (EUA). This EUA will remain  in effect (meaning this test can be used) for the duration of the COVID-19 declaration under Se ction 564(b)(1) of the Act, 21 U.S.C. section 360bbb-3(b)(1), unless the authorization is terminated or revoked sooner.  Performed at [facility], [place] [address] [place], [place] [address]    Surgical PCR screen     Status: Abnormal   Collection Time: 02/19/21 10:03 AM   Specimen: Nasal Mucosa; Nasal Swab  Result Value Ref Range Status   MRSA, PCR POSITIVE (A) NEGATIVE Final    Comment: RESULT CALLED TO, READ BACK BY AND VERIFIED WITH: BET BUJIANG,RN AT 1142 ON 02/19/21 BY GM    Staphylococcus aureus POSITIVE (A) NEGATIVE Final    Comment: (NOTE) The Xpert SA Assay (FDA approved for NASAL specimens in patients 15 years of age and older), is one component of a comprehensive surveillance program. It is not intended to diagnose infection nor to guide or monitor treatment. Performed at [facility], [address], [place], [place] [address]      Time coordinating discharge: Over 30 minutes  SIGNED:   Sidney Ace, MD  [facility] 02/22/2021, 10:34 AM Pager   If 7PM-7AM, please contact night-coverage

## 2021-02-22 NOTE — TOC Progression Note (Signed)
Transition of Care [facility]) - Progression Note    Patient Details  Name: [name] MRN: [id number] Date of Birth: Sep 20, 1935  [facility] CM/SW [place], RN Phone Number: 02/22/2021, 9:13 AM  Clinical Narrative:    The patient is [facility] with [facility] for 24/7 care and [facility] for PT, OT and Nursing The patient will transport Home with [facility] for EMS, at 2 PM         Expected Discharge Plan and [place] (SDOH) Interventions    Readmission Risk Interventions No flowsheet data found.

## 2021-02-22 NOTE — Plan of Care (Signed)

## 2021-02-22 NOTE — Progress Notes (Signed)
Physical Therapy Treatment Patient Details Name: [name] MRN: [id number] DOB: 1936/04/10 Today's Date: 02/22/2021   History of Present Illness 85 yo male with onset of back pain for a length of time had worsening pain after a fall and was admitted 9/7.  Has been mostly in a wheelchair since his lumbar soft tissue infection on 8/18.  Pt received kyphoplasty for L2 and L 3 on 9/9, compression on L1 was not corrected.  Additionally R hand third digit cellulitis and paronychia, no osteomyelitis; noted hyponatremia.  PMHx:  prostate CA with surgery, HTN, CAD, CHF, L great toe amp, MRSA bacteremia, back abscess, HLD,    PT Comments    Pt slowly progressing towards his therapy goals. Pt demonstrating increased fatigue with ambulation trial in room using RW. Pt required minA for sit to stand from raised surface and CGA + RW for ambulation in room. Pt requiring cues for maintaining spinal precautions during bed mobility activities. Recommending SNF placement however, per MD notes, pt to discharge home with HHPT services. Pt will continue to benefit from skilled PT services to improve activity tolerance, reduce risk of falls, and overall functional mobility for safe discharge.    Recommendations for follow up therapy are one component of a multi-disciplinary discharge planning process, led by the attending physician.  Recommendations may be updated based on patient status, additional functional criteria and insurance authorization.  Follow Up Recommendations  SNF;Other (comment) (Pt and spouse likely to d/c with home health per MD notes)     Equipment Recommendations  None recommended by PT    Recommendations for Other Services       Precautions / Restrictions Precautions Precautions: Fall;Back Precaution Booklet Issued: No Precaution Comments: Spinal Precautions Restrictions Weight Bearing Restrictions: No     Mobility  Bed Mobility Overal bed mobility: Needs Assistance Bed Mobility: Sit  to Supine       Sit to supine: Min assist;HOB elevated   General bed mobility comments: Verbal cues for usage of log roll technique however, pt attempting to move B LEs off EOB and bringing his trunk forward in one motion with HOB elevated. Pt requiring assistance with B LEs back into bed to maintain spinal precautions    Transfers Overall transfer level: Needs assistance Equipment used: Rolling walker (2 wheeled) Transfers: Sit to/from Stand Sit to Stand: Min assist;From elevated surface         General transfer comment: Requires assistance with verbal cues for increased forward lean to lift buttocks. Pt demonstrating B UE usage on RW to stand though cued for 1 hand pushing from bed.  Ambulation/Gait Ambulation/Gait assistance: Min guard Gait Distance (Feet): 25 Feet Assistive device: Rolling walker (2 wheeled) Gait Pattern/deviations: Step-to pattern;Wide base of support;Decreased dorsiflexion - left;Decreased dorsiflexion - right;Steppage Gait velocity: reduced   General Gait Details: Pt demonstrating decreased DF bilaterally creating a steppage gait for improved foot clearance. Pt reports using AFOs at home for ambulation. Pt often ambulates with R foot outside of RW due to chronic ER of R LE.   Stairs             Wheelchair Mobility    Modified Rankin (Stroke Patients Only)       Balance Overall balance assessment: Needs assistance;History of Falls Sitting-balance support: Feet supported Sitting balance-Leahy Scale: Fair Sitting balance - Comments: No LOB sitting at EOB with exercises   Standing balance support: Bilateral upper extremity supported Standing balance-Leahy Scale: Fair Standing balance comment: Requiring CGA to maintain balance with static  and dynamic standing balance                            Cognition Arousal/Alertness: Awake/alert Behavior During Therapy: WFL for tasks assessed/performed Overall Cognitive Status: Within  Functional Limits for tasks assessed         Exercises Other Exercises Other Exercises: Sitting: Marching 2 x 10, LAQ 2 x 10, Heel raises 1 x 10 (Decreased on L ankle compared to R ankle), attempted seated DF however, pt unable.    General Comments        Pertinent Vitals/Pain Pain Assessment: Faces Faces Pain Scale: Hurts little more Pain Location: Low back Pain Descriptors / Indicators: Grimacing;Guarding Pain Intervention(s): Limited activity within patient's tolerance;Monitored during session;Premedicated before session    Home Living                      Prior Function            PT Goals (current goals can now be found in the care plan section) Acute Rehab PT Goals Patient Stated Goal: to get back on his feet PT Goal Formulation: With patient Time For Goal Achievement: 03/06/21 Potential to Achieve Goals: Fair Progress towards PT goals: Progressing toward goals    Frequency    7X/week      PT Plan Frequency needs to be updated    Co-evaluation              AM-PAC PT "6 Clicks" Mobility   Outcome Measure  Help needed turning from your back to your side while in a flat bed without using bedrails?: A Lot Help needed moving from lying on your back to sitting on the side of a flat bed without using bedrails?: A Little Help needed moving to and from a bed to a chair (including a wheelchair)?: A Little Help needed standing up from a chair using your arms (e.g., wheelchair or bedside chair)?: A Little Help needed to walk in hospital room?: A Little Help needed climbing 3-5 steps with a railing? : Total 6 Click Score: 15    End of Session Equipment Utilized During Treatment: Gait belt Activity Tolerance: Patient limited by fatigue Patient left: in bed;with call bell/phone within reach;with bed alarm set;with SCD's reapplied Nurse Communication: Mobility status PT Visit Diagnosis: Unsteadiness on feet (R26.81);Muscle weakness (generalized)  (M62.81);History of falling (Z91.81)     Time: [id number] PT Time Calculation (min) (ACUTE ONLY): 20 min  Charges:  $Gait Training: 8-22 mins                     Andrey Campanile, SPT    Andrey Campanile 02/22/2021, 2:25 PM

## 2021-02-22 NOTE — Plan of Care (Signed)
  Problem: Education: Goal: Knowledge of General Education information will improve Description: Including pain rating scale, medication(s)/side effects and non-pharmacologic comfort measures Outcome: Adequate for Discharge   Problem: Health Behavior/Discharge Planning: Goal: Ability to manage health-related needs will improve Outcome: Adequate for Discharge   Problem: Clinical Measurements: Goal: Ability to maintain clinical measurements within normal limits will improve Outcome: Adequate for Discharge Goal: Will remain free from infection Outcome: Adequate for Discharge Goal: Diagnostic test results will improve Outcome: Adequate for Discharge Goal: Respiratory complications will improve Outcome: Adequate for Discharge Goal: Cardiovascular complication will be avoided Outcome: Adequate for Discharge   Problem: Activity: Goal: Risk for activity intolerance will decrease Outcome: Adequate for Discharge   Problem: Nutrition: Goal: Adequate nutrition will be maintained Outcome: Adequate for Discharge   Problem: Coping: Goal: Level of anxiety will decrease Outcome: Adequate for Discharge   Problem: Elimination: Goal: Will not experience complications related to bowel motility Outcome: Adequate for Discharge Goal: Will not experience complications related to urinary retention Outcome: Adequate for Discharge   Problem: Pain Managment: Goal: General experience of comfort will improve Outcome: Adequate for Discharge   Problem: Safety: Goal: Ability to remain free from injury will improve Outcome: Adequate for Discharge   Problem: Skin Integrity: Goal: Risk for impaired skin integrity will decrease Outcome: Adequate for Discharge   Problem: Education: Goal: Ability to describe self-care measures that may prevent or decrease complications (Diabetes Survival Skills Education) will improve Outcome: Adequate for Discharge Goal: Individualized Educational Video(s) Outcome:  Adequate for Discharge   Problem: Coping: Goal: Ability to adjust to condition or change in health will improve Outcome: Adequate for Discharge   Problem: Fluid Volume: Goal: Ability to maintain a balanced intake and output will improve Outcome: Adequate for Discharge   Problem: Health Behavior/Discharge Planning: Goal: Ability to identify and utilize available resources and services will improve Outcome: Adequate for Discharge Goal: Ability to manage health-related needs will improve Outcome: Adequate for Discharge   Problem: Metabolic: Goal: Ability to maintain appropriate glucose levels will improve Outcome: Adequate for Discharge   Problem: Nutritional: Goal: Maintenance of adequate nutrition will improve Outcome: Adequate for Discharge Goal: Progress toward achieving an optimal weight will improve Outcome: Adequate for Discharge   Problem: Skin Integrity: Goal: Risk for impaired skin integrity will decrease Outcome: Adequate for Discharge   Problem: Tissue Perfusion: Goal: Adequacy of tissue perfusion will improve Outcome: Adequate for Discharge   Problem: Increased Nutrient Needs (NI-5.1) Goal: Food and/or nutrient delivery Description: Individualized approach for food/nutrient provision. Outcome: Adequate for Discharge   Problem: Acute Rehab PT Goals(only PT should resolve) Goal: Pt Will Go Supine/Side To Sit Outcome: Adequate for Discharge Goal: Patient Will Transfer Sit To/From Stand Outcome: Adequate for Discharge Goal: Pt Will Transfer Bed To Chair/Chair To Bed Outcome: Adequate for Discharge Goal: Pt Will Perform Standing Balance Or Pre-Gait Outcome: Adequate for Discharge Goal: Pt Will Ambulate Outcome: Adequate for Discharge   Problem: Acute Rehab OT Goals (only OT should resolve) Goal: Pt. Will Perform Grooming Outcome: Adequate for Discharge Goal: Pt. Will Perform Lower Body Dressing Outcome: Adequate for Discharge Goal: Pt. Will Transfer To  Toilet Outcome: Adequate for Discharge

## 2021-02-22 NOTE — Progress Notes (Signed)
Discharge Note: Reviewed discharge instructions with pt. Pt verbalized understanding. Iv catheters intact upon removal. PT discharged home with all personal belongings. Patient transported to home via EMS.

## 2021-02-22 NOTE — Care Management Important Message (Signed)
Important Message  Patient Details  Name: [name] MRN: [id number] Date of Birth: [birth year]   Medicare Important Message Given:  Yes     Dannette Barbara 02/22/2021, 11:44 AM

## 2021-02-23 ENCOUNTER — Telehealth: Payer: Self-pay

## 2021-02-23 ENCOUNTER — Ambulatory Visit: Payer: Self-pay

## 2021-02-23 LAB — SURGICAL PATHOLOGY

## 2021-02-23 NOTE — Telephone Encounter (Signed)
Transition Care Management Unsuccessful Follow-up Telephone Call  Date of discharge and from where:  02/22/2021 [facility]  Attempts:  1st Attempt  Reason for unsuccessful TCM follow-up call:  Left voice message    

## 2021-02-23 NOTE — Telephone Encounter (Signed)
Transition Care Management Follow-up Telephone Call Date of discharge and from where: 02/22/2021 [facility] How have you been since you were released from the hospital? Still in severe pain and having trouble getting around Any questions or concerns? Yes, pain medication is not working. This nurse called Counsell[facility] and spoke to [place]. She is going to have someone call and check up on him.  Items Reviewed: Did the pt receive and understand the discharge instructions provided? Yes  Medications obtained and verified? Yes  Other? No  Any new allergies since your discharge? No  Dietary orders reviewed? No Do you have support at home? Yes   Home Care and Equipment/Supplies: Were home health services ordered? yes If so, what is the name of the agency?   Has the agency set up a time to come to the patient's home? no Were any new equipment or medical supplies ordered?  No What is the name of the medical supply agency? N/a Were you able to get the supplies/equipment? not applicable Do you have any questions related to the use of the equipment or supplies? No  Functional Questionnaire: (I = Independent and D = Dependent) ADLs: I  Bathing/Dressing- I  Meal Prep- D  Eating- I  Maintaining continence- I  Transferring/Ambulation- D  Managing Meds- I  Follow up appointments reviewed:  PCP Hospital f/u appt confirmed? Yes  Scheduled to see Dr. Parks Ranger on 03/03/2021 @ 1:40. [facility] f/u appt confirmed? Yes  Scheduled to see Dorise Hiss PA on 03/05/2021 @ 10:45. Are transportation arrangements needed? No  If their condition worsens, is the pt aware to call PCP or go to the Emergency Dept.? Yes Was the patient provided with contact information for the PCP's office or ED? Yes Was to pt encouraged to call back with questions or concerns? Yes

## 2021-02-23 NOTE — Telephone Encounter (Signed)
Called pt back and spoke to wife [place]. Pt was in room answering questions. Attempted to talk to pt but had to go to bathroom  to have a BM and per wife he is slow to ambulate.   Pt is straining to have BM. No blood noted on stool per wife. Wife stated that he has chronic constipation but now is thaking Oxycodone which has worsened sx. Pt stated he drink a lot of water- could not tell NT how much.  Pt has not used any laxatives or enemas. Care advice given and wife stated understanding. Advised to 1 part apple juice and 1 part prune juice mixed and served warm. Wife stated that they will try these suggestions and will call back as needed. Stated she will call back and get appt.        Reason for Disposition  Constipation is a chronic symptom (recurrent or ongoing AND present > 4 weeks)  Answer Assessment - Initial Assessment Questions 1. STOOL PATTERN OR FREQUENCY: "How often do you have a bowel movement (BM)?"  (Normal range: 3 times a day to every 3 days)  "When was your last BM?"       Last BM 1 week ago 2. STRAINING: "Do you have to strain to have a BM?"      yes 3. RECTAL PAIN: "Does your rectum hurt when the stool comes out?" If Yes, ask: "Do you have hemorrhoids? How bad is the pain?"  (Scale 1-10; or mild, moderate, severe)     No hemorrhoid,no  4. STOOL COMPOSITION: "Are the stools hard?"      yes 5. BLOOD ON STOOLS: "Has there been any blood on the toilet tissue or on the surface of the BM?" If Yes, ask: "When was the last time?"      No blood 6. CHRONIC CONSTIPATION: "Is this a new problem for you?"  If no, ask: "How long have you had this problem?" (days, weeks, months)      Yes- chronic  7. CHANGES IN DIET OR HYDRATION: "Have there been any recent changes in your diet?" "How much fluids are you drinking on a daily basis?"  "How much have you had to drink today?"    No-says he is drinking enough fluids 8. MEDICATIONS: "Have you been taking any new medications?" "Are you  taking any narcotic pain medications?" (e.g., Vicodin, Percocet, morphine, Dilaudid)     hydrocodone 9. LAXATIVES: "Have you been using any stool softeners, laxatives, or enemas?"  If yes, ask "What, how often, and when was the last time?"     no 10. ACTIVITY:  "How much walking do you do every day?"  "Has your activity level decreased in the past week?"        Not much 11. CAUSE: "What do you think is causing the constipation?"        Chronic issue 12. OTHER SYMPTOMS: "Do you have any other symptoms?" (e.g., abdominal pain, bloating, fever, vomiting)       no 13. MEDICAL HISTORY: "Do you have a history of hemorrhoids, rectal fissures, or rectal surgery or rectal abscess?"         no  Protocols used: Constipation-A-AH

## 2021-02-26 ENCOUNTER — Ambulatory Visit: Payer: Self-pay | Admitting: *Deleted

## 2021-02-26 NOTE — Telephone Encounter (Signed)
My recommendation would be to use a fleets enema.  If he does not have results with this he could repeat this x1.  If no BM occurs, would recommend ER or urgent care evaluation.  If he does have a bowel movement with this, would start MiraLAX daily in addition to Senokot.

## 2021-02-26 NOTE — Telephone Encounter (Signed)
Pt called in c/o being constipated.  He had back surgery on 02/19/2021.   He has not had a BM since then.   He stated he was having problems with being constipated prior to his back surgery.    He is taking the senokot twice a day.   They gave him a suppository in the hospital which did not help.   He has not tried any thing else for the constipation.   (He and his wife, [name] are fussing in the background).   She is telling him he is not exercising and drinking enough water and he is telling her to shut up.  He did mention he is still taking one oxycodone a day for pain.     He stated,   "I need to do something".   There were no appts available today with Dr. Parks Ranger at [facility].   I have sent a high priority note seeing if they can work him in or call in something for him.  Pt was agreeable to this plan.   He mentioned he may go to the ED because "I've got to do something".

## 2021-02-26 NOTE — Telephone Encounter (Signed)
Reason for Disposition  [1] Constipation persists > 1 week AND [2] no improvement after using CARE ADVICE  Answer Assessment - Initial Assessment Questions 1. STOOL PATTERN OR FREQUENCY: "How often do you have a bowel movement (BM)?"  (Normal range: 3 times a day to every 3 days)  "When was your last BM?"       Pt calling in c/o being constipated.   He called in before and the dr gave him medicine which has not helped the constipation.   He had back surgery and is taking narcotic pain medication once a day.     I was constipated before having back surgery.    2. STRAINING: "Do you have to strain to have a BM?"      Yes   I don't know when the last time I had a BM was. I had a suppository in the hospital which did not help.    I've not had a BM since being in the hospital for surgery.   Got home this past Monday.   I went in the previous wed. 3. RECTAL PAIN: "Does your rectum hurt when the stool comes out?" If Yes, ask: "Do you have hemorrhoids? How bad is the pain?"  (Scale 1-10; or mild, moderate, severe)     I'm having pains in mid section of my gut.   No bleeding noted. 4. STOOL COMPOSITION: "Are the stools hard?"      No stools 5. BLOOD ON STOOLS: "Has there been any blood on the toilet tissue or on the surface of the BM?" If Yes, ask: "When was the last time?"      No 6. CHRONIC CONSTIPATION: "Is this a new problem for you?"  If no, ask: "How long have you had this problem?" (days, weeks, months)      Yes 7. CHANGES IN DIET OR HYDRATION: "Have there been any recent changes in your diet?" "How much fluids are you drinking on a daily basis?"  "How much have you had to drink today?"     I drink a ton of water a day.   28 oz container twice a day of water. 8. MEDICATIONS: "Have you been taking any new medications?" "Are you taking any narcotic pain medications?" (e.g., Vicodin, Percocet, morphine, Dilaudid)     Oxycodone 5 mg one a day.   9. LAXATIVES: "Have you been using any stool softeners,  laxatives, or enemas?"  If yes, ask "What, how often, and when was the last time?"     None 10. ACTIVITY:  "How much walking do you do every day?"  "Has your activity level decreased in the past week?"        I am walking around the house. 11. CAUSE: "What do you think is causing the constipation?"        This is a chronic problem for me. 12. OTHER SYMPTOMS: "Do you have any other symptoms?" (e.g., abdominal pain, bloating, fever, vomiting)       Having pain in middle gut section in lower section. 13. MEDICAL HISTORY: "Do you have a history of hemorrhoids, rectal fissures, or rectal surgery or rectal abscess?"         No 14. PREGNANCY: "Is there any chance you are pregnant?" "When was your last menstrual period?"       N/A  Protocols used: Constipation-A-AH

## 2021-03-01 NOTE — Telephone Encounter (Signed)
The pt informed me that his constipation issue was resolved.

## 2021-03-03 ENCOUNTER — Encounter: Payer: Self-pay | Admitting: Family Medicine

## 2021-03-03 ENCOUNTER — Other Ambulatory Visit: Payer: Self-pay

## 2021-03-03 ENCOUNTER — Ambulatory Visit (INDEPENDENT_AMBULATORY_CARE_PROVIDER_SITE_OTHER): Payer: Medicare Other | Admitting: Family Medicine

## 2021-03-03 VITALS — BP 123/70 | HR 88 | Ht 72.0 in | Wt 184.2 lb

## 2021-03-03 DIAGNOSIS — M5136 Other intervertebral disc degeneration, lumbar region: Secondary | ICD-10-CM

## 2021-03-03 DIAGNOSIS — M51369 Other intervertebral disc degeneration, lumbar region without mention of lumbar back pain or lower extremity pain: Secondary | ICD-10-CM | POA: Insufficient documentation

## 2021-03-03 DIAGNOSIS — M25551 Pain in right hip: Secondary | ICD-10-CM | POA: Diagnosis not present

## 2021-03-03 DIAGNOSIS — M7061 Trochanteric bursitis, right hip: Secondary | ICD-10-CM | POA: Diagnosis not present

## 2021-03-03 DIAGNOSIS — Z9889 Other specified postprocedural states: Secondary | ICD-10-CM

## 2021-03-03 DIAGNOSIS — S32000A Wedge compression fracture of unspecified lumbar vertebra, initial encounter for closed fracture: Secondary | ICD-10-CM

## 2021-03-03 MED ORDER — TRAMADOL HCL 50 MG PO TABS: 50.0000 mg | ORAL_TABLET | Freq: Three times a day (TID) | ORAL | 0 refills | Status: DC | PRN

## 2021-03-03 MED ORDER — PREDNISONE 20 MG PO TABS: ORAL_TABLET | ORAL | 0 refills | Status: DC

## 2021-03-03 NOTE — Progress Notes (Signed)
Subjective:    Patient ID: [name], male    DOB: [birth year], 85 y.o.   MRN: [id number]  [name] is a 85 y.o. male presenting on 03/03/2021 for Back Pain   HPI  HOSPITAL FOLLOW-UP VISIT  Hospital/Location: [facility] Date of Admission: 02/17/21 Date of Discharge: 02/22/21 Transitions of care telephone call: 02/23/21  Reason for Admission: Fall Hip injury Back Compression Fracture  - Hospital H&P and Discharge Summary have been reviewed - Patient presents today 9 days  after recent hospitalization. Brief summary of recent course, patient had symptoms of fall injury and low back and R hip pain, hospitalized, treated w/  S/p Kyphoplasty lumbar spine vertebrae repair x 3  He has issue with sitting prolonged due to pain. He does well with standing.  He takes Tramadol PRN needs new rx - for more mild pain. Takes Oxycodone PRN for more severe pain. Took one this AM and has improved after nap.  Has upcoming Orthopedic Follow-up w/ Dr Rudene Christians on Friday 9/23 at 10:45am  - Today reports overall has done well after discharge. Symptoms of low back pain persisted with sitting and leaning back, improved if lean forward and standing.  - New medications on discharge: oxycodone  I have reviewed the discharge medication list, and have reconciled the current and discharge medications today.   Current Outpatient Medications:    Alpha Lipoic Acid 200 MG CAPS, Take 2 capsules by mouth daily., Disp: , Rfl:    amLODipine (NORVASC) 2.5 MG tablet, TAKE 1 TABLET BY MOUTH  DAILY, Disp: 90 tablet, Rfl: 1   Apoaequorin 10 MG CAPS, Take 1 capsule by mouth as directed., Disp: , Rfl:    aspirin 81 MG tablet, Take 162 mg by mouth at bedtime., Disp: , Rfl:    Azelastine HCl 137 MCG/SPRAY SOLN, PLACE 1 SPRAY INTO BOTH NOSTRILS 2 (TWO) TIMES DAILY. USE IN EACH NOSTRIL AS DIRECTED, Disp: 30 mL, Rfl: 5   B Complex Vitamins (VITAMIN-B COMPLEX PO), Take 1 tablet by mouth daily., Disp: , Rfl:    Biotin 1000 MCG tablet, Take  1,000 mcg by mouth daily., Disp: , Rfl:    Calcium Carb-Cholecalciferol 500-100 MG-UNIT CHEW, Chew 500 mg by mouth daily., Disp: , Rfl:    chlorhexidine (HIBICLENS) 4 % external liquid, Apply topically daily. Use to wash your body below neck, Disp: 120 mL, Rfl: 0   cyanocobalamin 1000 MCG tablet, Take 1,000 mcg by mouth daily., Disp: , Rfl:    diphenhydrAMINE (BENADRYL) 25 MG tablet, Take 25 mg by mouth at bedtime as needed for allergies., Disp: , Rfl:    folic acid (FOLVITE) 1 MG tablet, Take 1 mg by mouth daily., Disp: , Rfl:    furosemide (LASIX) 20 MG tablet, TAKE 1 TABLET BY MOUTH  DAILY, Disp: 90 tablet, Rfl: 1   gabapentin (NEURONTIN) 600 MG tablet, TAKE 2 TABLETS BY MOUTH  TWICE DAILY, Disp: 360 tablet, Rfl: 3   gentamicin cream (GARAMYCIN) 0.1 %, APPLY TO AFFECTED AREA 1 TIMES DAILY, Disp: , Rfl:    Glucosamine Sulfate 1000 MG CAPS, Take 1 capsule by mouth daily., Disp: , Rfl:    Lecithin 400 MG CAPS, Take 400 mg by mouth daily., Disp: , Rfl:    losartan (COZAAR) 50 MG tablet, Take 50 mg by mouth daily., Disp: , Rfl:    melatonin 5 MG TABS, Take 5 mg by mouth at bedtime., Disp: , Rfl:    Omega 3 1000 MG CAPS, Take 1,000 mg by mouth daily.,  Disp: , Rfl:    omeprazole (PRILOSEC) 20 MG capsule, TAKE 1 CAPSULE BY MOUTH  TWICE DAILY BEFORE MEALS, Disp: 180 capsule, Rfl: 3   oxyCODONE (OXY IR/ROXICODONE) 5 MG immediate release tablet, Take 1 tablet (5 mg total) by mouth every 4 (four) hours as needed for moderate pain., Disp: 20 tablet, Rfl: 0   potassium chloride SA (KLOR-CON) 20 MEQ tablet, Take 1 tablet (20 mEq total) by mouth 2 (two) times daily., Disp: 180 tablet, Rfl: 3   pravastatin (PRAVACHOL) 80 MG tablet, TAKE 1 TABLET BY MOUTH  DAILY, Disp: 90 tablet, Rfl: 3   predniSONE (DELTASONE) 20 MG tablet, Take daily with food. Start with 60mg  (3 pills) x 2 days, then reduce to 40mg  (2 pills) x 2 days, then 20mg  (1 pill) x 3 days, Disp: 13 tablet, Rfl: 0   primidone (MYSOLINE) 50 MG tablet,  Take 1-2 tablets (50-100 mg total) by mouth See admin instructions. Take 2 tablets (100mg ) by mouth every morning and take 1 tablet (50mg ) by mouth every night, Disp: 270 tablet, Rfl: 3   promethazine (PHENERGAN) 12.5 MG tablet, Take 1-2 tablets (12.5-25 mg total) by mouth every 8 (eight) hours as needed for nausea or vomiting. Caution sedation., Disp: 30 tablet, Rfl: 0   Selenium 200 MCG CAPS, Take 200 mcg by mouth daily., Disp: , Rfl:    traMADol (ULTRAM) 50 MG tablet, Take 1 tablet (50 mg total) by mouth every 8 (eight) hours as needed., Disp: 30 tablet, Rfl: 0   tretinoin (RETIN-A) 0.05 % cream, Apply 1 application topically at bedtime., Disp: , Rfl:    triamcinolone (KENALOG) 0.1 %, Apply 1 application topically 2 (two) times daily. (apply to hands, legs and feet), Disp: , Rfl:   ------------------------------------------------------------------------- Social History   Tobacco Use   Smoking status: Never   Smokeless tobacco: Never  Vaping Use   Vaping Use: Never used  Substance Use Topics   Alcohol use: Yes    Alcohol/week: 6.0 standard drinks    Types: 6 Glasses of wine per week    Comment: wine in the evening with dinner   Drug use: No    Review of Systems Per HPI unless specifically indicated above     Objective:    BP 123/70   Pulse 88   Ht 6' (1.829 m)   Wt 184 lb 3.2 oz (83.6 kg)   BMI 24.98 kg/m   Wt Readings from Last 3 Encounters:  03/03/21 184 lb 3.2 oz (83.6 kg)  02/19/21 205 lb 0.4 oz (93 kg)  02/04/21 206 lb (93.4 kg)    Physical Exam Vitals and nursing note reviewed.  Constitutional:      General: He is not in acute distress.    Appearance: He is well-developed. He is not diaphoretic.     Comments: Well-appearing, comfortable, cooperative  HENT:     Head: Normocephalic and atraumatic.  Eyes:     General:        Right eye: No discharge.        Left eye: No discharge.     Conjunctiva/sclera: Conjunctivae normal.  Neck:     Thyroid: No  thyromegaly.  Cardiovascular:     Rate and Rhythm: Normal rate and regular rhythm.     Pulses: Normal pulses.     Heart sounds: Normal heart sounds. No murmur heard. Pulmonary:     Effort: Pulmonary effort is normal. No respiratory distress.     Breath sounds: Normal breath sounds. No wheezing or  rales.  Musculoskeletal:     Cervical back: Normal range of motion and neck supple.     Comments: Localized midline and R low back pain over SI region and into gluteal, no bony point tenderness. Has range of motion, similar to baseline.  No sign of muscle spasm or hypertonicity  Able to ambulate and weight bear as per usual. Has foot drop and uses walker.  Lymphadenopathy:     Cervical: No cervical adenopathy.  Skin:    General: Skin is warm and dry.     Findings: No erythema or rash.  Neurological:     Mental Status: He is alert and oriented to person, place, and time. Mental status is at baseline.  Psychiatric:        Behavior: Behavior normal.     Comments: Well groomed, good eye contact, normal speech and thoughts    CLINICAL DATA:  Initial evaluation for acute compression fracture.   EXAM: MRI LUMBAR SPINE WITHOUT CONTRAST   TECHNIQUE: Multiplanar, multisequence MR imaging of the lumbar spine was performed. No intravenous contrast was administered.   COMPARISON:  Radiograph from earlier the same day.   FINDINGS: Segmentation: Transitional lumbosacral anatomy with a partially lumbarized S1 segment and rudimentary S1-2 interspace.   Alignment: Mild levoscoliosis. Alignment otherwise normal with preservation of the normal lumbar lordosis.   Vertebrae: There is an acute compression fracture extending through the inferior endplate of L2. Associated height loss is relatively mild measuring approximately 15% without significant bony retropulsion.   There is an additional acute compression fracture involving the superior endplate of L3. Associated height loss measures up to  20% without significant bony retropulsion. These are benign/mechanical in appearance.   Severe chronic compression deformity with near complete height loss and vertebral plana formation noted at L1, chronic in appearance without significant residual marrow edema. Associated up to 5 mm bony retropulsion at the posterior/inferior aspect of the collapsed L1 vertebral body.   Otherwise, vertebral body height maintained. Underlying bone marrow signal intensity is diffusely heterogeneous, likely reflecting osteoporosis/osteopenia. No worrisome osseous lesions. Additional mild discogenic reactive endplate change noted about the right aspect of the L4-5 interspace. No other abnormal marrow edema.   Conus medullaris and cauda equina: Conus extends to the L1 level. Visualized conus medullaris is within normal limits. Nerve roots of the cauda equina are diffusely irregular and undulating related to underlying spinal stenosis, largely due to prominent epidural lipomatosis.   Paraspinal and other soft tissues: Mild edema within the psoas musculature bilaterally adjacent to the acute compression fractures, right slightly worse than left. Additional mild edema within the lower posterior paraspinous musculature, suggesting associated muscular injury/strain. Underlying chronic atrophic changes noted throughout the visualized musculature. Few tiny simple cysts noted within the partially visualized right kidney. Visualized kidneys themselves are somewhat atrophic.   Disc levels:   T12-L1: Mild disc bulge with disc desiccation. Mild facet hypertrophy. No significant spinal stenosis. Foramina remain patent.   L1-2: Up to 5 mm bony retropulsion related to the chronic L1 compression fracture. Superimposed disc bulge with disc desiccation with mild to moderate facet hypertrophy. Prominence of the dorsal epidural fat. Resultant mild-to-moderate spinal stenosis. Mild-to-moderate bilateral L1 foraminal  narrowing.   L2-3: Disc bulge with disc desiccation. Mild-to-moderate bilateral facet hypertrophy. Prominent epidural lipomatosis with resultant moderate spinal stenosis. Moderate right with mild left L2 foraminal narrowing.   L3-4: Degenerative intervertebral disc space narrowing with mild diffuse disc bulge. Associated reactive endplate spurring. Moderate facet and ligament flavum hypertrophy. Epidural lipomatosis.  Resultant moderate to severe spinal stenosis, with the thecal sac measuring 5 mm in AP diameter, largely due to epidural lipomatosis. Mild left with moderate right L3 foraminal narrowing.   L4-5: Degenerative intervertebral disc space narrowing with diffuse disc bulge and reactive endplate change. Superimposed left extraforaminal disc protrusion contacts the exiting left L4 nerve root (series 9, image 25). Moderate facet hypertrophy. Circumferential epidural lipomatosis. Resultant moderate spinal stenosis, largely due to epidural fat. Mild right with moderate left L4 foraminal narrowing.   L5-S1: Degenerative intervertebral disc space narrowing with diffuse disc bulge, disc desiccation, and reactive endplate spurring. Moderate facet hypertrophy. Mild epidural lipomatosis. No significant spinal stenosis. Moderate left worse than right L5 foraminal narrowing.   S1-2: Transitional lumbosacral anatomy with rudimentary S1-2 interspace. No disc bulge or focal disc herniation. Mild to moderate facet hypertrophy. No spinal stenosis. Foramina remain patent.   IMPRESSION: 1. Acute compression fractures involving the L2 and L3 vertebral bodies with up to 15-20% height loss as above, but no significant bony retropulsion. These are benign/mechanical in appearance. 2. Diffuse edema within the psoas and posterior paraspinous musculature, suggesting a degree of associated muscular injury/strain. 3. Severe chronic compression deformity at L1 with near complete height loss. 4.  Multilevel spondylosis with diffuse epidural lipomatosis throughout the lumbar spine, resulting in moderate to severe diffuse spinal stenosis, most pronounced at L3-4.     Electronically Signed   By: Jeannine Boga M.D.   On: 02/17/2021 23:31   Results for orders placed or performed during the hospital encounter of 02/17/21  SARS CORONAVIRUS 2 (TAT 6-24 HRS) Nasopharyngeal Nasopharyngeal Swab   Specimen: Nasopharyngeal Swab  Result Value Ref Range   SARS Coronavirus 2 NEGATIVE NEGATIVE  Surgical PCR screen   Specimen: Nasal Mucosa; Nasal Swab  Result Value Ref Range   MRSA, PCR POSITIVE (A) NEGATIVE   Staphylococcus aureus POSITIVE (A) NEGATIVE  Comprehensive metabolic panel  Result Value Ref Range   Sodium 126 (L) 135 - 145 mmol/L   Potassium 2.9 (L) 3.5 - 5.1 mmol/L   Chloride 85 (L) 98 - 111 mmol/L   CO2 30 22 - 32 mmol/L   Glucose, Bld 119 (H) 70 - 99 mg/dL   BUN 11 8 - 23 mg/dL   Creatinine, Ser 0.75 0.61 - 1.24 mg/dL   Calcium 10.0 8.9 - 10.3 mg/dL   Total Protein 7.2 6.5 - 8.1 g/dL   Albumin 4.0 3.5 - 5.0 g/dL   AST 15 15 - 41 U/L   ALT 15 0 - 44 U/L   Alkaline Phosphatase 67 38 - 126 U/L   Total Bilirubin 0.5 0.3 - 1.2 mg/dL   GFR, Estimated >60 >60 mL/min   Anion gap 11 5 - 15  CBC with Differential  Result Value Ref Range   WBC 3.8 (L) 4.0 - 10.5 K/uL   RBC RESULTS UNAVAILABLE DUE TO INTERFERING SUBSTANCE 4.22 - 5.81 MIL/uL   Hemoglobin 10.8 (L) 13.0 - 17.0 g/dL   HCT RESULTS UNAVAILABLE DUE TO INTERFERING SUBSTANCE 39.0 - 52.0 %   MCV RESULTS UNAVAILABLE DUE TO INTERFERING SUBSTANCE 80.0 - 100.0 fL   MCH RESULTS UNAVAILABLE DUE TO INTERFERING SUBSTANCE 26.0 - 34.0 pg   MCHC RESULTS UNAVAILABLE DUE TO INTERFERING SUBSTANCE 30.0 - 36.0 g/dL   RDW RESULTS UNAVAILABLE DUE TO INTERFERING SUBSTANCE 11.5 - 15.5 %   Platelets 142 (L) 150 - 400 K/uL   nRBC RESULTS UNAVAILABLE DUE TO INTERFERING SUBSTANCE 0.0 - 0.2 %   Neutrophils Relative % 52 %  Neutro Abs  2.0 1.7 - 7.7 K/uL   Lymphocytes Relative 23 %   Lymphs Abs 0.9 0.7 - 4.0 K/uL   Monocytes Relative 21 %   Monocytes Absolute 0.8 0.1 - 1.0 K/uL   Eosinophils Relative 2 %   Eosinophils Absolute 0.1 0.0 - 0.5 K/uL   Basophils Relative 1 %   Basophils Absolute 0.0 0.0 - 0.1 K/uL   WBC Morphology MORPHOLOGY UNREMARKABLE    RBC Morphology MORPHOLOGY UNREMARKABLE    Smear Review Normal platelet morphology    Immature Granulocytes 1 %   Abs Immature Granulocytes 0.02 0.00 - 0.07 K/uL  Urinalysis, Complete w Microscopic Urine, Clean Catch  Result Value Ref Range   Color, Urine YELLOW (A) YELLOW   APPearance CLEAR (A) CLEAR   Specific Gravity, Urine 1.015 1.005 - 1.030   pH 7.5 5.0 - 8.0   Glucose, UA NEGATIVE NEGATIVE mg/dL   Hgb urine dipstick TRACE (A) NEGATIVE   Bilirubin Urine NEGATIVE NEGATIVE   Ketones, ur NEGATIVE NEGATIVE mg/dL   Protein, ur NEGATIVE NEGATIVE mg/dL   Nitrite NEGATIVE NEGATIVE   Leukocytes,Ua NEGATIVE NEGATIVE   RBC / HPF 0-5 0 - 5 RBC/hpf   WBC, UA 0-5 0 - 5 WBC/hpf   Bacteria, UA NONE SEEN NONE SEEN   Squamous Epithelial / LPF NONE SEEN 0 - 5   Mucus PRESENT   Basic metabolic panel  Result Value Ref Range   Sodium 126 (L) 135 - 145 mmol/L   Potassium 2.9 (L) 3.5 - 5.1 mmol/L   Chloride 88 (L) 98 - 111 mmol/L   CO2 27 22 - 32 mmol/L   Glucose, Bld 91 70 - 99 mg/dL   BUN 9 8 - 23 mg/dL   Creatinine, Ser 0.76 0.61 - 1.24 mg/dL   Calcium 9.4 8.9 - 10.3 mg/dL   GFR, Estimated >60 >60 mL/min   Anion gap 11 5 - 15  CBC with Differential/Platelet  Result Value Ref Range   WBC 4.8 4.0 - 10.5 K/uL   RBC 2.47 (L) 4.22 - 5.81 MIL/uL   Hemoglobin 9.3 (L) 13.0 - 17.0 g/dL   HCT 25.1 (L) 39.0 - 52.0 %   MCV 101.6 (H) 80.0 - 100.0 fL   MCH 37.7 (H) 26.0 - 34.0 pg   MCHC 37.1 (H) 30.0 - 36.0 g/dL   RDW 12.1 11.5 - 15.5 %   Platelets 204 150 - 400 K/uL   nRBC 0.0 0.0 - 0.2 %   Neutrophils Relative % 51 %   Neutro Abs 2.4 1.7 - 7.7 K/uL   Lymphocytes  Relative 30 %   Lymphs Abs 1.4 0.7 - 4.0 K/uL   Monocytes Relative 12 %   Monocytes Absolute 0.6 0.1 - 1.0 K/uL   Eosinophils Relative 5 %   Eosinophils Absolute 0.2 0.0 - 0.5 K/uL   Basophils Relative 1 %   Basophils Absolute 0.0 0.0 - 0.1 K/uL   Immature Granulocytes 1 %   Abs Immature Granulocytes 0.03 0.00 - 0.07 K/uL  Basic metabolic panel  Result Value Ref Range   Sodium 128 (L) 135 - 145 mmol/L   Potassium 3.5 3.5 - 5.1 mmol/L   Chloride 94 (L) 98 - 111 mmol/L   CO2 26 22 - 32 mmol/L   Glucose, Bld 106 (H) 70 - 99 mg/dL   BUN 11 8 - 23 mg/dL   Creatinine, Ser 0.85 0.61 - 1.24 mg/dL   Calcium 9.5 8.9 - 10.3 mg/dL   GFR, Estimated >60 >  60 mL/min   Anion gap 8 5 - 15  Magnesium  Result Value Ref Range   Magnesium 1.7 1.7 - 2.4 mg/dL  Basic metabolic panel  Result Value Ref Range   Sodium 131 (L) 135 - 145 mmol/L   Potassium 3.5 3.5 - 5.1 mmol/L   Chloride 98 98 - 111 mmol/L   CO2 25 22 - 32 mmol/L   Glucose, Bld 103 (H) 70 - 99 mg/dL   BUN 9 8 - 23 mg/dL   Creatinine, Ser 0.67 0.61 - 1.24 mg/dL   Calcium 8.9 8.9 - 10.3 mg/dL   GFR, Estimated >60 >60 mL/min   Anion gap 8 5 - 15  Sample to Blood Bank  Result Value Ref Range   Blood Bank Specimen SAMPLE AVAILABLE FOR TESTING    Sample Expiration      02/20/2021,2359 Performed at [facility], [address], [place], [place] [address]   Surgical pathology  Result Value Ref Range   SURGICAL PATHOLOGY      SURGICAL PATHOLOGY CASE: [id number] PATIENT: [name] Surgical Pathology Report     Specimen Submitted: A. L2 bone; bx B. L3 bone; bx  Clinical History: Percutaneous vertebral augmentation    DIAGNOSIS: A. BONE, L2 VERTEBRAL BODY; BIOPSY: - MILDLY DISRUPTED BONE WITH FOCAL BONY REMODELING AND MARROW FIBROSIS, COMPATIBLE WITH HISTORY OF COMPRESSION FRACTURE. 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  Comment: The significance of the histologic changes seen in the marrow sampling is unclear, but in the context of a persistent anemia with macrocytosis, could be concerning for an evolving myeloid stem cell disorder. Clinical correlation is recommended, and possible hematologic evaluation may be warranted.  B. BONE, L3 VERTEBRAL BODY; BIOPSY: - FRAGMENTED BONE AND SOFT TISSUE, COMPATIBLE WITH HISTORY OF COMPRESSION FRACTURE. - NEGATIVE FOR MALIGNANCY.  GROSS DESCRIPTI ON: A. Labeled: L2 vertebral bone biopsy Received: Formalin Collection time: 1:47 PM on 02/19/2021 Placed into formalin time: 1:48 PM on 02/19/2021 Number of needle core biopsy(s): 1 Length: 1.2 cm Diameter: 0.3 cm Description: Received on Telfa pad is a core of tan firm bone and multiple additional fragments of bone, 0.7 x 0.5 x 0.3 cm in aggregate. Ink: None Entirely submitted in 1 cassette, following light decalcification.  B. Labeled: L3 vertebral bone biopsy Received: Formalin Collection time: 1:50 PM on 02/19/2021 Placed into formalin time: 1:51 PM on 02/19/2021 Number of needle core biopsy(s): 1 Length: 0.5 cm Diameter: 0.3 cm Description: Received on a Telfa pad is a core of tan softened bone. Ink: None Entirely submitted in 1 cassette.  RB 02/22/2021   Final Diagnosis performed by Allena Napoleon, MD.   Electronically signed 02/23/2021 11:58:56AM The electronic signature indicates that the named Attending Pathologist has evaluated the specimen Technical compon ent performed at [facility], [address], [place], [place] [address] Lab: [phone] Dir: Rush Farmer, MD, MMM  Professional component performed at [facility], [facility], [place], [place],  [address] Lab: [phone] Dir: Dellia Nims. Rubinas, MD       Assessment & Plan:   Problem List Items Addressed This Visit     Lumbar compression fracture (HCC)   Relevant Medications   traMADol (ULTRAM) 50 MG  tablet   DDD (degenerative disc disease), lumbar   Relevant Medications   traMADol (ULTRAM) 50 MG tablet   predniSONE (DELTASONE) 20 MG tablet   Other Visit Diagnoses     Right hip pain    -  Primary   Relevant Medications   traMADol (ULTRAM) 50 MG tablet   S/P kyphoplasty       Relevant Medications   traMADol (ULTRAM) 50 MG tablet   Trochanteric bursitis of right hip       Relevant Medications   predniSONE (DELTASONE) 20 MG tablet       Hospitalization reviewed. S/p lumbar compression fracture with kyphoplasty per orthopedics Improvement overall but still has pain with prolong sitting and upright better if lean forward exam and history suggestive of having pain now from DDD with impingement. Also could have R trochantertic bursitis based on localized pain as well. Did not take prior prednisone taper  Will re order today Prednisone taper  Re order Tramadol PRN, has oxycodone for more severe pain PRN.  Has home healthPT  Using walker as usual for ambulation  Has 24 hr caregiver now  Precautions reviewed  F/u with ortho to discuss back pain and spine concerns at this time.  Meds ordered this encounter  Medications   traMADol (ULTRAM) 50 MG tablet    Sig: Take 1 tablet (50 mg total) by mouth every 8 (eight) hours as needed.    Dispense:  30 tablet    Refill:  0   predniSONE (DELTASONE) 20 MG tablet    Sig: Take daily with food. Start with 60mg  (3 pills) x 2 days, then reduce to 40mg  (2 pills) x 2 days, then 20mg  (1 pill) x 3 days    Dispense:  13 tablet    Refill:  0    Follow up plan: Return if symptoms worsen or fail to improve.  [name], [facility] 03/03/2021, 1:54 PM

## 2021-03-03 NOTE — Patient Instructions (Addendum)
Thank you for coming to the office today.  I think the problem now is with the lumbar spinal discs - arthritis and pinching on the discs  The kyphoplasty surgery helps support the lumbar spine itself structurally but if you sit and pressure goes into the spine directly it causes disc pain and some nerve pain  Also can still have R hip bursitis  Take the 7 day prednisone steroid taper.  Take Tramadol as needed for mild to moderate pain and Oxycodone for severe pain  Follow up Friday 9/23 w/ Dr Sammuel Bailiff about the spine  Please schedule a Follow-up Appointment to: Return if symptoms worsen or fail to improve.  If you have any other questions or concerns, please feel free to call the office or send a message through [facility]. You may also schedule an earlier appointment if necessary.  Additionally, you may be receiving a survey about your experience at our office within a few days to 1 week by e-mail or mail. We value your feedback.  [name], DO [facility]

## 2021-03-04 ENCOUNTER — Encounter: Payer: Self-pay | Admitting: Orthopedic Surgery

## 2021-03-09 ENCOUNTER — Ambulatory Visit: Payer: Medicare Other | Attending: Infectious Diseases | Admitting: Infectious Diseases

## 2021-03-09 ENCOUNTER — Other Ambulatory Visit: Payer: Self-pay

## 2021-03-09 VITALS — BP 95/61 | HR 85 | Resp 16

## 2021-03-09 DIAGNOSIS — Z79899 Other long term (current) drug therapy: Secondary | ICD-10-CM | POA: Diagnosis not present

## 2021-03-09 DIAGNOSIS — Z888 Allergy status to other drugs, medicaments and biological substances status: Secondary | ICD-10-CM | POA: Insufficient documentation

## 2021-03-09 DIAGNOSIS — Z89412 Acquired absence of left great toe: Secondary | ICD-10-CM | POA: Diagnosis not present

## 2021-03-09 DIAGNOSIS — Z8614 Personal history of Methicillin resistant Staphylococcus aureus infection: Secondary | ICD-10-CM | POA: Insufficient documentation

## 2021-03-09 DIAGNOSIS — Z885 Allergy status to narcotic agent status: Secondary | ICD-10-CM | POA: Insufficient documentation

## 2021-03-09 DIAGNOSIS — I10 Essential (primary) hypertension: Secondary | ICD-10-CM | POA: Diagnosis not present

## 2021-03-09 DIAGNOSIS — I2510 Atherosclerotic heart disease of native coronary artery without angina pectoris: Secondary | ICD-10-CM | POA: Diagnosis not present

## 2021-03-09 DIAGNOSIS — Z7901 Long term (current) use of anticoagulants: Secondary | ICD-10-CM | POA: Insufficient documentation

## 2021-03-09 DIAGNOSIS — Z7982 Long term (current) use of aspirin: Secondary | ICD-10-CM | POA: Insufficient documentation

## 2021-03-09 DIAGNOSIS — Z09 Encounter for follow-up examination after completed treatment for conditions other than malignant neoplasm: Secondary | ICD-10-CM | POA: Diagnosis not present

## 2021-03-09 DIAGNOSIS — G629 Polyneuropathy, unspecified: Secondary | ICD-10-CM | POA: Insufficient documentation

## 2021-03-09 NOTE — Patient Instructions (Signed)
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

## 2021-03-09 NOTE — Progress Notes (Signed)
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 He recently completed linezolid and rifampin and other decolonization protocol Has not had any infection for the past 2 weeks Recently was hospitlaized for back pain following a fall and had L2 acute compression fracture on 02/17/21 MRI and underwent L2-L3  kyphoplasty on 02/19/21 He has been doing well He has home help service  Past Medical History:  Diagnosis Date   Arthritis    Benign essential tremor    Fracture, femur ([place]) 2012   GERD (gastroesophageal reflux disease)    H/O echocardiogram    05/2013   Hx of colonoscopy    04/22/2013   Hypercholesterolemia    Hypertension    Myocardial infarction [facility])    1987   Polyneuropathy    Prostate cancer ([place]) 2004   Superficial hematoma     Past Surgical History:  Procedure Laterality Date   APPLICATION OF WOUND VAC Left 08/12/2020   Procedure: APPLICATION OF WOUND VAC;  Surgeon: Samara Deist, DPM;  Location: [facility] ORS;  Service: Podiatry;  Laterality: Left;   BASAL CELL CARCINOMA EXCISION     COLONOSCOPY  05/01/2009   Positive for colonic polyps   COLONOSCOPY WITH PROPOFOL N/A 02/24/2016   Procedure: COLONOSCOPY WITH PROPOFOL;  Surgeon: Christene Lye, MD;  Location: [facility];  Service: Endoscopy;  Laterality: N/A;   CORONARY ARTERY BYPASS GRAFT  1987   X2   HERNIA REPAIR  1990   KYPHOPLASTY N/A 02/19/2021   Procedure: KYPHOPLASTY-L2&L3;  Surgeon: Hessie Knows, MD;  Location: [facility] OR[facility];  Service: Orthopedics;  Laterality: N/A;   PROSTATE SURGERY  2004   TEE WITHOUT CARDIOVERSION N/A 08/13/2020   Procedure:  TRANSESOPHAGEAL ECHOCARDIOGRAM (TEE);  Surgeon: Corey Skains, MD;  Location: [facility] OR[facility];  Service: Cardiovascular;  Laterality: N/A;   WOUND DEBRIDEMENT Left 08/12/2020   Procedure: DEBRIDEMENT WOUND-Left Heel;  Surgeon: Samara Deist, DPM;  Location: [facility];  Service: Podiatry;  Laterality: Left;    Social History   Socioeconomic History   Marital status: Married    Spouse name: Not on file   Number of children: Not on file   Years of education: Not on file   Highest education level: Not on file  Occupational History   Not on file  Tobacco Use   Smoking status: Never   Smokeless tobacco: Never  Vaping Use   Vaping Use: Never used  Substance and Sexual Activity   Alcohol use: Yes    Alcohol/week: 6.0 standard drinks    Types: 6 Glasses of wine per week    Comment: wine in the evening with dinner   Drug use: No   Sexual activity: Not on file  Other Topics Concern   Not on file  Social History Narrative   Not on file   Social Determinants of Health   Financial Resource Strain: Not on file  Food Insecurity: Not on file  Transportation Needs: Not on file  Physical Activity: Not on file  Stress: Not on file  Social Connections: Not on file  Intimate Partner Violence: Not on file    Family History  Problem Relation Age of Onset  Heart disease Mother    Congestive Heart Failure Father    Allergies  Allergen Reactions   Codeine Nausea And Vomiting   Indocin [Indomethacin] Other (See Comments)   Latex Other (See Comments)    Blisters   Mupirocin     Blisters   Plavix [Clopidogrel Bisulfate]     GI intolerance, n/v   Polysporin [Bacitracin-Polymyxin B]     Blisters   Shellfish Allergy Nausea And Vomiting    Mussels   Tape    Neosporin [Neomycin-Bacitracin Zn-Polymyx] Rash   Other Nausea And Vomiting    mussels   I? Current Outpatient Medications  Medication Sig Dispense Refill   Alpha Lipoic Acid 200 MG CAPS Take 2 capsules by mouth daily.      amLODipine (NORVASC) 2.5 MG tablet TAKE 1 TABLET BY MOUTH  DAILY 90 tablet 1   Apoaequorin 10 MG CAPS Take 1 capsule by mouth as directed.     aspirin 81 MG tablet Take 162 mg by mouth at bedtime.     Azelastine HCl 137 MCG/SPRAY SOLN PLACE 1 SPRAY INTO BOTH NOSTRILS 2 (TWO) TIMES DAILY. USE IN EACH NOSTRIL AS DIRECTED 30 mL 5   B Complex Vitamins (VITAMIN-B COMPLEX PO) Take 1 tablet by mouth daily.     Biotin 1000 MCG tablet Take 1,000 mcg by mouth daily.     Calcium Carb-Cholecalciferol 500-100 MG-UNIT CHEW Chew 500 mg by mouth daily.     chlorhexidine (HIBICLENS) 4 % external liquid Apply topically daily. Use to wash your body below neck 120 mL 0   cyanocobalamin 1000 MCG tablet Take 1,000 mcg by mouth daily.     diphenhydrAMINE (BENADRYL) 25 MG tablet Take 25 mg by mouth at bedtime as needed for allergies.     folic acid (FOLVITE) 1 MG tablet Take 1 mg by mouth daily.     furosemide (LASIX) 20 MG tablet TAKE 1 TABLET BY MOUTH  DAILY 90 tablet 1   gabapentin (NEURONTIN) 600 MG tablet TAKE 2 TABLETS BY MOUTH  TWICE DAILY 360 tablet 3   gentamicin cream (GARAMYCIN) 0.1 % APPLY TO AFFECTED AREA 1 TIMES DAILY     Glucosamine Sulfate 1000 MG CAPS Take 1 capsule by mouth daily.     Lecithin 400 MG CAPS Take 400 mg by mouth daily.     losartan (COZAAR) 50 MG tablet Take 50 mg by mouth daily.     melatonin 5 MG TABS Take 5 mg by mouth at bedtime.     Omega 3 1000 MG CAPS Take 1,000 mg by mouth daily.     omeprazole (PRILOSEC) 20 MG capsule TAKE 1 CAPSULE BY MOUTH  TWICE DAILY BEFORE MEALS 180 capsule 3   oxyCODONE (OXY IR/ROXICODONE) 5 MG immediate release tablet Take 1 tablet (5 mg total) by mouth every 4 (four) hours as needed for moderate pain. 20 tablet 0   potassium chloride SA (KLOR-CON) 20 MEQ tablet Take 1 tablet (20 mEq total) by mouth 2 (two) times daily. 180 tablet 3   pravastatin (PRAVACHOL) 80 MG tablet TAKE 1 TABLET BY MOUTH  DAILY 90 tablet 3   predniSONE (DELTASONE) 20 MG tablet  Take daily with food. Start with 60mg  (3 pills) x 2 days, then reduce to 40mg  (2 pills) x 2 days, then 20mg  (1 pill) x 3 days 13 tablet 0   primidone (MYSOLINE) 50 MG tablet Take 1-2 tablets (50-100 mg total) by mouth See admin instructions. Take 2 tablets (100mg ) by mouth every morning and  take 1 tablet (50mg ) by mouth every night 270 tablet 3   promethazine (PHENERGAN) 12.5 MG tablet Take 1-2 tablets (12.5-25 mg total) by mouth every 8 (eight) hours as needed for nausea or vomiting. Caution sedation. 30 tablet 0   Selenium 200 MCG CAPS Take 200 mcg by mouth daily.     traMADol (ULTRAM) 50 MG tablet Take 1 tablet (50 mg total) by mouth every 8 (eight) hours as needed. 30 tablet 0   tretinoin (RETIN-A) 0.05 % cream Apply 1 application topically at bedtime.     triamcinolone (KENALOG) 0.1 % Apply 1 application topically 2 (two) times daily. (apply to hands, legs and feet)     No current facility-administered medications for this visit.     Abtx:  Anti-infectives (From admission, onward)    None       REVIEW OF SYSTEMS:  Const: negative fever, negative chills, negative weight loss Eyes: negative diplopia or visual changes, negative eye pain ENT: hard of hearing  Resp: negative cough, hemoptysis, dyspnea Cards: negative for chest pain, palpitations, lower extremity edema GU: negative for frequency, dysuria and hematuria GI: Negative for abdominal pain, diarrhea, bleeding, constipation Skin: negative for rash and pruritus Heme: negative for easy bruising and gum/nose bleeding MS: back pain much better Neurolo:neuropathy hands and feet Psych: negative for feelings of anxiety, depression  Endocrine: negative for thyroid, diabetes Allergy/Immunology- as above ?  Objective:  VITALS:  BP 95/61   Pulse 85   Resp 16   SpO2 96%  PHYSICAL EXAM:  General:looks well, no distress ENT N Neck: Supple, symmetrical, no adenopathy, thyroid: non tender no carotid bruit and no  JVD. Back:healed lesions Lungs: Clear to auscultation bilaterally. No Wheezing or Rhonchi. No rales. Heart: Regular rate and rhythm, no murmur, rub or gallop. Abdomen: soft extremities: amputated left great toe  Pertinent Labs None currently  Impression/Recommendation Recent l1 compression fracture- s/p kyhoplasty- improvement in pain- followed by Dr.Menz  MRSA soft tissue infection cleared completely- finished decolonization protocol with linezolid and rifampin 3 weeks ago ? History of MRSA bacteremia feb 2022 due to infected foot- completed treatment  History of left great toe amputation  CAD status post CABG On Pravachol and aspirin  Peripheral neuropathy  _Pt is discharged from my clinic- will follow PRN__________________________________________________

## 2021-03-22 ENCOUNTER — Other Ambulatory Visit: Payer: Self-pay

## 2021-03-22 ENCOUNTER — Ambulatory Visit
Admission: EM | Admit: 2021-03-22 | Discharge: 2021-03-22 | Disposition: A | Discharge status: Home / Self Care | Payer: Medicare Other | Attending: Internal Medicine | Admitting: Internal Medicine

## 2021-03-22 ENCOUNTER — Encounter: Payer: Self-pay | Admitting: Emergency Medicine

## 2021-03-22 DIAGNOSIS — S40811A Abrasion of right upper arm, initial encounter: Secondary | ICD-10-CM

## 2021-03-22 DIAGNOSIS — L03114 Cellulitis of left upper limb: Secondary | ICD-10-CM

## 2021-03-22 DIAGNOSIS — S0083XA Contusion of other part of head, initial encounter: Secondary | ICD-10-CM | POA: Diagnosis not present

## 2021-03-22 DIAGNOSIS — S40812A Abrasion of left upper arm, initial encounter: Secondary | ICD-10-CM | POA: Diagnosis not present

## 2021-03-22 MED ORDER — CEPHALEXIN 500 MG PO CAPS: 500.0000 mg | ORAL_CAPSULE | Freq: Three times a day (TID) | ORAL | 0 refills | Status: DC

## 2021-03-22 NOTE — Discharge Instructions (Addendum)
Change the dressings daily and if your care giver on the weekend is not comfortable doing it, come here for [place] to do it. I am placing you on antibiotics, because the one of the L looks like you are getting an infection.  Stop taking the aspirin for 3 days

## 2021-03-22 NOTE — ED Triage Notes (Signed)
Pt presents today with caregiver. Patient is alert and oriented x 3. He reports that he fell at home Friday evening in his bedroom. He has bilateral foot drop and difficulty with ambulation.  EMS came to home, he was assessed by EMS, both arms were bandaged, pt declined to go to ER.  Today, his caregiver comes to home and finds pt has both elbows bandaged. She is unable to unwrap both arms and states "the bandage is stuck to the wounds". Pt also has bruising to right forehead and right clavicle area.

## 2021-03-22 NOTE — ED Provider Notes (Signed)
[facility]    CSN: [id number] Arrival date & time: 03/22/21  1238      History   Chief Complaint Chief Complaint  Patient presents with   Fall   Abrasion    HPI [name] is a 85 y.o. male who presents with his care giver due to having a fall 3 days ago and hit his R forehead , R chest and abraded his forearms. EMS was called and they bandaged his wounds and pt declined going to ER. His care giver has not been able to remove the dressing since the are stuck to his skin. Has hx of  bilateral foot drop and wears braces. He states that 3 days ago he was getting ready for bed and was not using his walker or had his braces on when he was trying to accommodate himself on his bed, so he tried to walk a couple of steps and his foot caught on the floor and fell on the carpet on his forearms and his his L forehead and R chest. He denies LOC.     Past Medical History:  Diagnosis Date   Arthritis    Benign essential tremor    Fracture, femur ([place]) 2012   GERD (gastroesophageal reflux disease)    H/O echocardiogram    05/2013   Hx of colonoscopy    04/22/2013   Hypercholesterolemia    Hypertension    Myocardial infarction [facility])    1987   Polyneuropathy    Prostate cancer ([place]) 2004   Superficial hematoma     Patient Active Problem List   Diagnosis Date Noted   DDD (degenerative disc disease), lumbar 03/03/2021   Cellulitis 02/18/2021   Lumbar compression fracture ([place]) 02/18/2021   Compression fracture of first lumbar vertebra ([place]) 02/17/2021   Cellulitis and abscess of foot    MRSA bacteremia 08/10/2020   Sepsis ([place]) 04/24/2020   Cellulitis of knee, left 04/22/2020   Hypokalemia 04/22/2020   Osteoarthritis of multiple joints 12/15/2017   Hyponatremia 03/28/2017   Abnormality of gait and mobility 01/11/2017   Cloudy vision 01/11/2017   Foot drop, bilateral 01/11/2017   Amputee, great toe, left ([place]) 01/11/2017   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) 04/23/2014   Mitral insufficiency 11/05/2013   CAD (coronary artery disease) 11/01/2013   Basal cell carcinoma of skin of other parts of face 03/14/2013   Ankle sprain 09/09/2011   History of fibula fracture 09/09/2011    Past Surgical History:  Procedure Laterality Date   APPLICATION OF WOUND VAC Left 08/12/2020   Procedure: APPLICATION OF WOUND VAC;  Surgeon: Samara Deist, DPM;  Location: [facility];  Service: Podiatry;  Laterality: Left;   BASAL CELL CARCINOMA EXCISION     COLONOSCOPY  05/01/2009   Positive for colonic polyps   COLONOSCOPY WITH PROPOFOL N/A 02/24/2016   Procedure: COLONOSCOPY WITH PROPOFOL;  Surgeon: Christene Lye, MD;  Location: [facility];  Service: Endoscopy;  Laterality: N/A;   CORONARY ARTERY BYPASS GRAFT  1987   [place]  KYPHOPLASTY N/A 02/19/2021   Procedure: KYPHOPLASTY-L2&L3;  Surgeon: Hessie Knows, MD;  Location: [facility];  Service: Orthopedics;  Laterality: N/A;   PROSTATE SURGERY  2004   TEE WITHOUT CARDIOVERSION N/A 08/13/2020   Procedure: TRANSESOPHAGEAL ECHOCARDIOGRAM (TEE);  Surgeon: Corey Skains, MD;  Location: [facility];  Service: Cardiovascular;  Laterality: N/A;   WOUND DEBRIDEMENT Left 08/12/2020   Procedure: DEBRIDEMENT WOUND-Left Heel;  Surgeon: Samara Deist, DPM;  Location: [facility];  Service: Podiatry;  Laterality: Left;       Home Medications    Prior to Admission medications   Medication Sig Start Date End Date Taking?  Authorizing Provider  cephALEXin (KEFLEX) 500 MG capsule Take 1 capsule (500 mg total) by mouth 3 (three) times daily. 03/22/21  Yes Rodriguez-Southworth, Sunday Spillers, PA-C  Alpha Lipoic Acid 200 MG CAPS Take 2 capsules by mouth daily.    [provider]  amLODipine (NORVASC) 2.5 MG tablet TAKE 1 TABLET BY MOUTH  DAILY 01/08/21   Karamalegos, Devonne Doughty, DO  Apoaequorin 10 MG CAPS Take 1 capsule by mouth as directed.    [provider]  aspirin 81 MG tablet Take 162 mg by mouth at bedtime.    [provider]  Azelastine HCl 137 MCG/SPRAY SOLN PLACE 1 SPRAY INTO BOTH NOSTRILS 2 (TWO) TIMES DAILY. USE IN EACH NOSTRIL AS DIRECTED 11/07/20   Parks Ranger, Devonne Doughty, DO  B Complex Vitamins (VITAMIN-B COMPLEX PO) Take 1 tablet by mouth daily.    [provider]  Biotin 1000 MCG tablet Take 1,000 mcg by mouth daily.    [provider]  Calcium Carb-Cholecalciferol 500-100 MG-UNIT CHEW Chew 500 mg by mouth daily.    [provider]  chlorhexidine (HIBICLENS) 4 % external liquid Apply topically daily. Use to wash your body below neck 02/04/21   Tsosie Billing, MD  cyanocobalamin 1000 MCG tablet Take 1,000 mcg by mouth daily.    [provider]  diphenhydrAMINE (BENADRYL) 25 MG tablet Take 25 mg by mouth at bedtime as needed for allergies.    [provider]  folic acid (FOLVITE) 1 MG tablet Take 1 mg by mouth daily.    [provider]  furosemide (LASIX) 20 MG tablet TAKE 1 TABLET BY MOUTH  DAILY 01/08/21   Parks Ranger, Devonne Doughty, DO  gabapentin (NEURONTIN) 600 MG tablet TAKE 2 TABLETS BY MOUTH  TWICE DAILY 01/08/21   Karamalegos, Devonne Doughty, DO  gentamicin cream (GARAMYCIN) 0.1 % APPLY TO AFFECTED AREA 1 TIMES DAILY 12/03/20   [provider]  Glucosamine Sulfate 1000 MG CAPS Take 1 capsule by mouth daily.    [provider]  Lecithin 400 MG CAPS Take 400 mg by mouth daily.    [provider]   losartan (COZAAR) 50 MG tablet Take 50 mg by mouth daily. 02/09/21   [provider]  melatonin 5 MG TABS Take 5 mg by mouth at bedtime.    [provider]  Omega 3 1000 MG CAPS Take 1,000 mg by mouth daily.    [provider]  omeprazole (PRILOSEC) 20 MG capsule TAKE 1 CAPSULE BY MOUTH  TWICE DAILY BEFORE MEALS 01/08/21   Karamalegos, Devonne Doughty, DO  oxyCODONE (OXY IR/ROXICODONE) 5 MG immediate release tablet Take 1 tablet (5 mg total) by mouth every 4 (four) hours as needed for moderate pain. 02/22/21   Sidney Ace, MD  potassium chloride SA (KLOR-CON) 20 MEQ tablet Take 1 tablet (20 mEq total) by mouth 2 (two) times  daily. 09/30/20   [name], DO  pravastatin (PRAVACHOL) 80 MG tablet TAKE 1 TABLET BY MOUTH  DAILY 01/08/21   Parks Ranger, Devonne Doughty, DO  predniSONE (DELTASONE) 20 MG tablet Take daily with food. Start with 60mg  (3 pills) x 2 days, then reduce to 40mg  (2 pills) x 2 days, then 20mg  (1 pill) x 3 days 03/03/21   [name], DO  primidone (MYSOLINE) 50 MG tablet Take 1-2 tablets (50-100 mg total) by mouth See admin instructions. Take 2 tablets (100mg ) by mouth every morning and take 1 tablet (50mg ) by mouth every night 01/08/21   Karamalegos, Devonne Doughty, DO  promethazine (PHENERGAN) 12.5 MG tablet Take 1-2 tablets (12.5-25 mg total) by mouth every 8 (eight) hours as needed for nausea or vomiting. Caution sedation. 01/28/21   Karamalegos, Devonne Doughty, DO  Selenium 200 MCG CAPS Take 200 mcg by mouth daily.    [provider]  traMADol (ULTRAM) 50 MG tablet Take 1 tablet (50 mg total) by mouth every 8 (eight) hours as needed. 03/03/21   Karamalegos, Devonne Doughty, DO  tretinoin (RETIN-A) 0.05 % cream Apply 1 application topically at bedtime.    [provider]  triamcinolone (KENALOG) 0.1 % Apply 1 application topically 2 (two) times daily. (apply to hands, legs and feet) 07/27/20   [provider]    Family  History Family History  Problem Relation Age of Onset   Heart disease Mother    Congestive Heart Failure Father     Social History Social History   Tobacco Use   Smoking status: Never   Smokeless tobacco: Never  Vaping Use   Vaping Use: Never used  Substance Use Topics   Alcohol use: Yes    Alcohol/week: 6.0 standard drinks    Types: 6 Glasses of wine per week    Comment: wine in the evening with dinner   Drug use: No     Allergies   Codeine, Indocin [indomethacin], Latex, Mupirocin, Plavix [clopidogrel bisulfate], Polysporin [bacitracin-polymyxin b], Shellfish allergy, Tape, Neosporin [neomycin-bacitracin zn-polymyx], and Other   Review of Systems Review of Systems  Gastrointestinal:  Negative for abdominal pain.  Musculoskeletal:  Positive for back pain. Negative for arthralgias.  Skin:  Positive for color change and wound. Negative for rash.  Neurological:  Negative for headaches.    Physical Exam Triage Vital Signs ED Triage Vitals  Enc Vitals Group     BP 03/22/21 1457 120/70     Pulse Rate 03/22/21 1457 77     Resp 03/22/21 1457 16     Temp 03/22/21 1457 98.4 F (36.9 C)     Temp Source 03/22/21 1457 Oral     SpO2 03/22/21 1457 100 %     Weight --      Height --      Head Circumference --      Peak Flow --      Pain Score 03/22/21 1455 5     Pain Loc --      Pain Edu? --      Excl. in [place]? --    No data found.  Updated Vital Signs BP 120/70 (BP Location: Left Arm)   Pulse 77   Temp 98.4 F (36.9 C) (Oral)   Resp 16   SpO2 100%   Visual Acuity Right Eye Distance:   Left Eye Distance:   Bilateral Distance:    Right Eye Near:   Left Eye Near:    Bilateral Near:  Physical Exam Vitals and nursing note reviewed.  Constitutional:      General: He is not in acute distress.    Appearance: He is not toxic-appearing.     Comments: Sitting on a wheelchair  HENT:     Right Ear: Tympanic membrane, ear canal and external ear normal.     Left  Ear: Tympanic membrane, ear canal and external ear normal.  Eyes:     General: No scleral icterus.    Conjunctiva/sclera: Conjunctivae normal.  Pulmonary:     Effort: Pulmonary effort is normal.  Musculoskeletal:     Cervical back: Neck supple. No tenderness.     Comments: Has normal ROM of upper extremities  BACK- no spine tenderness  Skin:    General: Skin is warm and dry.     Findings: Bruising present.     Comments: R UE- has skin missing about 1.5 x 2 cm with bleeding on R posterior foream, no redness or heat noted  LUE- with 3cm x 2cm wound missing the dermis, with mild bleeding. Has erythema and warmth above  this area.   Neurological:     Mental Status: He is alert and oriented to person, place, and time.     Gait: Gait normal.  Psychiatric:        Mood and Affect: Mood normal.        Behavior: Behavior normal.        Thought Content: Thought content normal.        Judgment: Judgment normal.     UC Treatments / Results  Labs (all labs ordered are listed, but only abnormal results are displayed) Labs Reviewed - No data to display  EKG   Radiology No results found.  Procedures Procedures (including critical care time)  Medications Ordered in UC Medications - No data to display  Initial Impression / Assessment and Plan / UC Course  I have reviewed the triage vital signs and the nursing notes. The RN took the bandage off the R forearm after soaking it in my presence and looked to be a flap abrasion. Both wound were cleansed with saline and hexaclease and non stick dressing applied to both.  I placed him on Keflex as noted.  See instructions.  Final Clinical Impressions(s) / UC Diagnoses   Final diagnoses:  Contusion of face, initial encounter  Abrasion of skin of left upper arm  Arm abrasion, right, initial encounter  Cellulitis of left arm     Discharge Instructions      Change the dressings daily and if your care giver on the weekend is not  comfortable doing it, come here for [place] to do it. I am placing you on antibiotics, because the one of the L looks like you are getting an infection.  Stop taking the aspirin for 3 days     ED Prescriptions     Medication Sig Dispense Auth. Provider   cephALEXin (KEFLEX) 500 MG capsule Take 1 capsule (500 mg total) by mouth 3 (three) times daily. 21 capsule Rodriguez-Southworth, Sunday Spillers, PA-C      PDMP not reviewed this encounter.   Shelby Mattocks, Hershal Coria 03/22/21 2156

## 2021-03-25 ENCOUNTER — Other Ambulatory Visit: Payer: Self-pay

## 2021-03-25 ENCOUNTER — Encounter: Payer: Self-pay | Admitting: Family Medicine

## 2021-03-25 ENCOUNTER — Ambulatory Visit (INDEPENDENT_AMBULATORY_CARE_PROVIDER_SITE_OTHER): Payer: Medicare Other | Admitting: Family Medicine

## 2021-03-25 VITALS — Ht 72.0 in | Wt 184.0 lb

## 2021-03-25 DIAGNOSIS — S32000A Wedge compression fracture of unspecified lumbar vertebra, initial encounter for closed fracture: Secondary | ICD-10-CM | POA: Diagnosis not present

## 2021-03-25 DIAGNOSIS — S51011A Laceration without foreign body of right elbow, initial encounter: Secondary | ICD-10-CM | POA: Diagnosis not present

## 2021-03-25 DIAGNOSIS — M5441 Lumbago with sciatica, right side: Secondary | ICD-10-CM

## 2021-03-25 DIAGNOSIS — M25551 Pain in right hip: Secondary | ICD-10-CM

## 2021-03-25 DIAGNOSIS — Z9889 Other specified postprocedural states: Secondary | ICD-10-CM

## 2021-03-25 MED ORDER — TRAMADOL HCL 50 MG PO TABS: 50.0000 mg | ORAL_TABLET | Freq: Three times a day (TID) | ORAL | 0 refills | Status: AC | PRN

## 2021-03-25 MED ORDER — CYCLOBENZAPRINE HCL 10 MG PO TABS: 5.0000 mg | ORAL_TABLET | Freq: Every evening | ORAL | 2 refills | Status: AC | PRN

## 2021-03-25 NOTE — Progress Notes (Signed)
Subjective:    Patient ID: [name], male    DOB: 02/08/1936, 85 y.o.   MRN: [id number]  [name] is a 85 y.o. male presenting on 03/25/2021 for Fall, Back Pain, and Arm Injury  Here with caregiver  HPI  Skin Abrasions / Tears Elbows Acute on chronic Low back Pain Fall 6 days ago last week Cannot turn over now due to pain. Usually Jakota can maneuver himself in bed without pain but now significant pain Seen at urgent care on 10/10, wounds cleansed and dressed, Keflex TID x 7 days prevention Today doing well with R elbow healing. Using dressings. L side still has oozing and continues dressings 4 more days antibiotic left. Needs more pain medication out of Tramadol, worse pain now from fall and prior recent history  Constipation Worse over 2 weeks with pain medication Tried Mag citrate in past. Tried Sennakot. Out of Tramadol now needs new order, was taking 1-2 times a day PRN with good result. Was on Oxy IR previous from hospital Held on Aspirin    Depression screen [facility] 2/9 01/12/2021 12/19/2019 03/20/2019  Decreased Interest 0 0 0  Down, Depressed, Hopeless 0 0 0  PHQ - 2 Score 0 0 0    Social History   Tobacco Use   Smoking status: Never   Smokeless tobacco: Never  Vaping Use   Vaping Use: Never used  Substance Use Topics   Alcohol use: Yes    Alcohol/week: 6.0 standard drinks    Types: 6 Glasses of wine per week    Comment: wine in the evening with dinner   Drug use: No    Review of Systems Per HPI unless specifically indicated above     Objective:    Ht 6' (1.829 m)   Wt 184 lb (83.5 kg)   BMI 24.95 kg/m   Wt Readings from Last 3 Encounters:  03/25/21 184 lb (83.5 kg)  03/03/21 184 lb 3.2 oz (83.6 kg)  02/19/21 205 lb 0.4 oz (93 kg)    Physical Exam Vitals and nursing note reviewed.  Constitutional:      General: He is not in acute distress.    Appearance: Normal appearance. He is well-developed. He is not diaphoretic.     Comments:  uncomfortable, cooperative  HENT:     Head: Normocephalic and atraumatic.  Eyes:     General:        Right eye: No discharge.        Left eye: No discharge.     Conjunctiva/sclera: Conjunctivae normal.  Cardiovascular:     Rate and Rhythm: Normal rate.  Pulmonary:     Effort: Pulmonary effort is normal.  Skin:    General: Skin is warm and dry.     Findings: Bruising present. No erythema or rash.     Comments: Bilateral elbows with skin abrasion tear, L worse than R. R is mostly healed with some granulation tissue no bleeding  Neurological:     Mental Status: He is alert and oriented to person, place, and time.  Psychiatric:        Mood and Affect: Mood normal.        Behavior: Behavior normal.        Thought Content: Thought content normal.     Comments: Well groomed, good eye contact, normal speech and thoughts   Results for orders placed or performed during the hospital encounter of 02/17/21  SARS CORONAVIRUS 2 (TAT 6-24 HRS) Nasopharyngeal Nasopharyngeal Swab  Specimen: Nasopharyngeal Swab  Result Value Ref Range   SARS Coronavirus 2 NEGATIVE NEGATIVE  Surgical PCR screen   Specimen: Nasal Mucosa; Nasal Swab  Result Value Ref Range   MRSA, PCR POSITIVE (A) NEGATIVE   Staphylococcus aureus POSITIVE (A) NEGATIVE  Comprehensive metabolic panel  Result Value Ref Range   Sodium 126 (L) 135 - 145 mmol/L   Potassium 2.9 (L) 3.5 - 5.1 mmol/L   Chloride 85 (L) 98 - 111 mmol/L   CO2 30 22 - 32 mmol/L   Glucose, Bld 119 (H) 70 - 99 mg/dL   BUN 11 8 - 23 mg/dL   Creatinine, Ser 0.75 0.61 - 1.24 mg/dL   Calcium 10.0 8.9 - 10.3 mg/dL   Total Protein 7.2 6.5 - 8.1 g/dL   Albumin 4.0 3.5 - 5.0 g/dL   AST 15 15 - 41 U/L   ALT 15 0 - 44 U/L   Alkaline Phosphatase 67 38 - 126 U/L   Total Bilirubin 0.5 0.3 - 1.2 mg/dL   GFR, Estimated >60 >60 mL/min   Anion gap 11 5 - 15  CBC with Differential  Result Value Ref Range   WBC 3.8 (L) 4.0 - 10.5 K/uL   RBC RESULTS UNAVAILABLE DUE  TO INTERFERING SUBSTANCE 4.22 - 5.81 MIL/uL   Hemoglobin 10.8 (L) 13.0 - 17.0 g/dL   HCT RESULTS UNAVAILABLE DUE TO INTERFERING SUBSTANCE 39.0 - 52.0 %   MCV RESULTS UNAVAILABLE DUE TO INTERFERING SUBSTANCE 80.0 - 100.0 fL   MCH RESULTS UNAVAILABLE DUE TO INTERFERING SUBSTANCE 26.0 - 34.0 pg   MCHC RESULTS UNAVAILABLE DUE TO INTERFERING SUBSTANCE 30.0 - 36.0 g/dL   RDW RESULTS UNAVAILABLE DUE TO INTERFERING SUBSTANCE 11.5 - 15.5 %   Platelets 142 (L) 150 - 400 K/uL   nRBC RESULTS UNAVAILABLE DUE TO INTERFERING SUBSTANCE 0.0 - 0.2 %   Neutrophils Relative % 52 %   Neutro Abs 2.0 1.7 - 7.7 K/uL   Lymphocytes Relative 23 %   Lymphs Abs 0.9 0.7 - 4.0 K/uL   Monocytes Relative 21 %   Monocytes Absolute 0.8 0.1 - 1.0 K/uL   Eosinophils Relative 2 %   Eosinophils Absolute 0.1 0.0 - 0.5 K/uL   Basophils Relative 1 %   Basophils Absolute 0.0 0.0 - 0.1 K/uL   WBC Morphology MORPHOLOGY UNREMARKABLE    RBC Morphology MORPHOLOGY UNREMARKABLE    Smear Review Normal platelet morphology    Immature Granulocytes 1 %   Abs Immature Granulocytes 0.02 0.00 - 0.07 K/uL  Urinalysis, Complete w Microscopic Urine, Clean Catch  Result Value Ref Range   Color, Urine YELLOW (A) YELLOW   APPearance CLEAR (A) CLEAR   Specific Gravity, Urine 1.015 1.005 - 1.030   pH 7.5 5.0 - 8.0   Glucose, UA NEGATIVE NEGATIVE mg/dL   Hgb urine dipstick TRACE (A) NEGATIVE   Bilirubin Urine NEGATIVE NEGATIVE   Ketones, ur NEGATIVE NEGATIVE mg/dL   Protein, ur NEGATIVE NEGATIVE mg/dL   Nitrite NEGATIVE NEGATIVE   Leukocytes,Ua NEGATIVE NEGATIVE   RBC / HPF 0-5 0 - 5 RBC/hpf   WBC, UA 0-5 0 - 5 WBC/hpf   Bacteria, UA NONE SEEN NONE SEEN   Squamous Epithelial / LPF NONE SEEN 0 - 5   Mucus PRESENT   Basic metabolic panel  Result Value Ref Range   Sodium 126 (L) 135 - 145 mmol/L   Potassium 2.9 (L) 3.5 - 5.1 mmol/L   Chloride 88 (L) 98 - 111 mmol/L  CO2 27 22 - 32 mmol/L   Glucose, Bld 91 70 - 99 mg/dL   BUN 9 8 - 23  mg/dL   Creatinine, Ser 0.76 0.61 - 1.24 mg/dL   Calcium 9.4 8.9 - 10.3 mg/dL   GFR, Estimated >60 >60 mL/min   Anion gap 11 5 - 15  CBC with Differential/Platelet  Result Value Ref Range   WBC 4.8 4.0 - 10.5 K/uL   RBC 2.47 (L) 4.22 - 5.81 MIL/uL   Hemoglobin 9.3 (L) 13.0 - 17.0 g/dL   HCT 25.1 (L) 39.0 - 52.0 %   MCV 101.6 (H) 80.0 - 100.0 fL   MCH 37.7 (H) 26.0 - 34.0 pg   MCHC 37.1 (H) 30.0 - 36.0 g/dL   RDW 12.1 11.5 - 15.5 %   Platelets 204 150 - 400 K/uL   nRBC 0.0 0.0 - 0.2 %   Neutrophils Relative % 51 %   Neutro Abs 2.4 1.7 - 7.7 K/uL   Lymphocytes Relative 30 %   Lymphs Abs 1.4 0.7 - 4.0 K/uL   Monocytes Relative 12 %   Monocytes Absolute 0.6 0.1 - 1.0 K/uL   Eosinophils Relative 5 %   Eosinophils Absolute 0.2 0.0 - 0.5 K/uL   Basophils Relative 1 %   Basophils Absolute 0.0 0.0 - 0.1 K/uL   Immature Granulocytes 1 %   Abs Immature Granulocytes 0.03 0.00 - 0.07 K/uL  Basic metabolic panel  Result Value Ref Range   Sodium 128 (L) 135 - 145 mmol/L   Potassium 3.5 3.5 - 5.1 mmol/L   Chloride 94 (L) 98 - 111 mmol/L   CO2 26 22 - 32 mmol/L   Glucose, Bld 106 (H) 70 - 99 mg/dL   BUN 11 8 - 23 mg/dL   Creatinine, Ser 0.85 0.61 - 1.24 mg/dL   Calcium 9.5 8.9 - 10.3 mg/dL   GFR, Estimated >60 >60 mL/min   Anion gap 8 5 - 15  Magnesium  Result Value Ref Range   Magnesium 1.7 1.7 - 2.4 mg/dL  Basic metabolic panel  Result Value Ref Range   Sodium 131 (L) 135 - 145 mmol/L   Potassium 3.5 3.5 - 5.1 mmol/L   Chloride 98 98 - 111 mmol/L   CO2 25 22 - 32 mmol/L   Glucose, Bld 103 (H) 70 - 99 mg/dL   BUN 9 8 - 23 mg/dL   Creatinine, Ser 0.67 0.61 - 1.24 mg/dL   Calcium 8.9 8.9 - 10.3 mg/dL   GFR, Estimated >60 >60 mL/min   Anion gap 8 5 - 15  Sample to Blood Bank  Result Value Ref Range   Blood Bank Specimen SAMPLE AVAILABLE FOR TESTING    Sample Expiration      02/20/2021,2359 Performed at [facility], [address], [place], [place] [address]    Surgical pathology  Result Value Ref Range   SURGICAL PATHOLOGY      SURGICAL PATHOLOGY CASE: [id number] PATIENT: [name] Surgical Pathology Report     Specimen Submitted: A. L2 bone; bx B. L3 bone; bx  Clinical History: Percutaneous vertebral augmentation    DIAGNOSIS: A. BONE, L2 VERTEBRAL BODY; BIOPSY: - MILDLY DISRUPTED BONE WITH FOCAL BONY REMODELING AND MARROW FIBROSIS, COMPATIBLE WITH HISTORY OF COMPRESSION FRACTURE. 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  Comment: The significance of the histologic changes seen in the marrow sampling is unclear, but in the context of a persistent anemia  with macrocytosis, could be concerning for an evolving myeloid stem cell disorder. Clinical correlation is recommended, and possible hematologic evaluation may be warranted.  B. BONE, L3 VERTEBRAL BODY; BIOPSY: - FRAGMENTED BONE AND SOFT TISSUE, COMPATIBLE WITH HISTORY OF COMPRESSION FRACTURE. - NEGATIVE FOR MALIGNANCY.  GROSS DESCRIPTI ON: A. Labeled: L2 vertebral bone biopsy Received: Formalin Collection time: 1:47 PM on 02/19/2021 Placed into formalin time: 1:48 PM on 02/19/2021 Number of needle core biopsy(s): 1 Length: 1.2 cm Diameter: 0.3 cm Description: Received on Telfa pad is a core of tan firm bone and multiple additional fragments of bone, 0.7 x 0.5 x 0.3 cm in aggregate. Ink: None Entirely submitted in 1 cassette, following light decalcification.  B. Labeled: L3 vertebral bone biopsy Received: Formalin Collection time: 1:50 PM on 02/19/2021 Placed into formalin time: 1:51 PM on 02/19/2021 Number of needle core biopsy(s): 1 Length: 0.5 cm Diameter: 0.3 cm Description: Received on a Telfa pad is a core of tan softened bone. Ink: None Entirely submitted in 1 cassette.  RB 02/22/2021   Final Diagnosis performed by Allena Napoleon, MD.   Electronically signed 02/23/2021  11:58:56AM The electronic signature indicates that the named Attending Pathologist has evaluated the specimen Technical compon ent performed at [facility], [address], [place], [place] [address] Lab: [phone] Dir: Rush Farmer, MD, MMM  Professional component performed at [facility], [facility], [place], [place], [place] [address] Lab: [phone] Dir: Dellia Nims. Rubinas, MD       Assessment & Plan:   Problem List Items Addressed This Visit     Lumbar compression fracture (HCC)   Relevant Medications   traMADol (ULTRAM) 50 MG tablet   Other Visit Diagnoses     Acute bilateral low back pain with right-sided sciatica    -  Primary   Relevant Medications   traMADol (ULTRAM) 50 MG tablet   cyclobenzaprine (FLEXERIL) 10 MG tablet   Skin tear of right elbow without complication, initial encounter       Right hip pain       Relevant Medications   traMADol (ULTRAM) 50 MG tablet   S/P kyphoplasty       Relevant Medications   traMADol (ULTRAM) 50 MG tablet       Acute on chronic low back pain with sciatica Known history complicated with s/p kyphoplasty  Fall injury with skin abrasions tears on elbows R elbow improving and healing with dressings L elbow has significant oozing still but managed with appropriate dressing Changing daily, vaseline/lubricated synthetic gauze or material on top of wound, non adherent pads and gauze wrap. Can do wet to dry dressing for changes. R side can be open air after 2-3 days L side may need >1 week. No sign of infection, on keflex for few more days monitor for signs infection  For wound care supplies, unable to identify specific items they are requiring, asked caregiver to work with pharmacy to find what is in stock and able to be ordered and notify us with specific order and we can sign off.  Will re order tramadol at inc dose up to 4 per day can take 1 TID and 2 at night and Flexeril rx PRN QHS caution sedation  Meds  ordered this encounter  Medications   traMADol (ULTRAM) 50 MG tablet    Sig: Take 1-2 tablets (50-100 mg total) by mouth every 8 (eight) hours as needed.    Dispense:  120 tablet    Refill:  0   cyclobenzaprine (  FLEXERIL) 10 MG tablet    Sig: Take 0.5-1 tablets (5-10 mg total) by mouth at bedtime as needed for muscle spasms.    Dispense:  30 tablet    Refill:  2     Follow up plan: No follow-ups on file.   [name], [facility] 03/25/2021, 11:25 AM

## 2021-04-16 ENCOUNTER — Telehealth: Payer: Self-pay

## 2021-04-16 NOTE — Telephone Encounter (Signed)
Spoke to Cristy Folks NP at [facility]. Reviewed his hospitalization and this case, patient to be discharged to home with [facility], Hospital Bed, referrals, and assistance.  [name], DO [facility] 04/16/2021, 1:16 PM

## 2021-04-16 NOTE — Telephone Encounter (Signed)
Copied from [place] [id number]. Topic: General - Other >> Apr 15, 2021 12:54 PM Leward Quan A wrote: Reason for CRM: Cristy Folks NP [facility] needing to speak to Dr Raliegh Ip about patients hospitalization. Please call Randall Hiss upon your earliest convenience at Ph# [phone]

## 2021-04-17 ENCOUNTER — Emergency Department: Payer: Medicare Other

## 2021-04-17 ENCOUNTER — Emergency Department
Admission: EM | Admit: 2021-04-17 | Discharge: 2021-04-18 | DRG: 871 | Disposition: A | Discharge status: Home / Self Care | Payer: Medicare Other | Attending: Emergency Medicine | Admitting: Emergency Medicine

## 2021-04-17 ENCOUNTER — Inpatient Hospital Stay: Payer: Medicare Other

## 2021-04-17 ENCOUNTER — Other Ambulatory Visit: Payer: Self-pay

## 2021-04-17 ENCOUNTER — Encounter: Payer: Self-pay | Admitting: Emergency Medicine

## 2021-04-17 DIAGNOSIS — A419 Sepsis, unspecified organism: Secondary | ICD-10-CM | POA: Diagnosis present

## 2021-04-17 DIAGNOSIS — I2690 Septic pulmonary embolism without acute cor pulmonale: Secondary | ICD-10-CM | POA: Insufficient documentation

## 2021-04-17 DIAGNOSIS — Z91040 Latex allergy status: Secondary | ICD-10-CM | POA: Diagnosis not present

## 2021-04-17 DIAGNOSIS — R4182 Altered mental status, unspecified: Secondary | ICD-10-CM | POA: Diagnosis present

## 2021-04-17 DIAGNOSIS — I10 Essential (primary) hypertension: Secondary | ICD-10-CM | POA: Insufficient documentation

## 2021-04-17 DIAGNOSIS — S2220XA Unspecified fracture of sternum, initial encounter for closed fracture: Secondary | ICD-10-CM | POA: Diagnosis not present

## 2021-04-17 DIAGNOSIS — M7989 Other specified soft tissue disorders: Secondary | ICD-10-CM

## 2021-04-17 DIAGNOSIS — Z7982 Long term (current) use of aspirin: Secondary | ICD-10-CM | POA: Diagnosis not present

## 2021-04-17 DIAGNOSIS — N390 Urinary tract infection, site not specified: Secondary | ICD-10-CM | POA: Diagnosis not present

## 2021-04-17 DIAGNOSIS — X58XXXA Exposure to other specified factors, initial encounter: Secondary | ICD-10-CM | POA: Diagnosis not present

## 2021-04-17 DIAGNOSIS — J189 Pneumonia, unspecified organism: Principal | ICD-10-CM

## 2021-04-17 DIAGNOSIS — I76 Septic arterial embolism: Secondary | ICD-10-CM

## 2021-04-17 DIAGNOSIS — Z20822 Contact with and (suspected) exposure to covid-19: Secondary | ICD-10-CM | POA: Diagnosis not present

## 2021-04-17 DIAGNOSIS — A4102 Sepsis due to Methicillin resistant Staphylococcus aureus: Secondary | ICD-10-CM | POA: Diagnosis present

## 2021-04-17 DIAGNOSIS — Z79899 Other long term (current) drug therapy: Secondary | ICD-10-CM | POA: Insufficient documentation

## 2021-04-17 DIAGNOSIS — I4891 Unspecified atrial fibrillation: Secondary | ICD-10-CM | POA: Diagnosis present

## 2021-04-17 DIAGNOSIS — Z8546 Personal history of malignant neoplasm of prostate: Secondary | ICD-10-CM | POA: Insufficient documentation

## 2021-04-17 LAB — URINALYSIS, ROUTINE W REFLEX MICROSCOPIC
Bilirubin Urine: NEGATIVE
Glucose, UA: NEGATIVE mg/dL
Ketones, ur: 5 mg/dL — AB
Nitrite: NEGATIVE
Protein, ur: 100 mg/dL — AB
Specific Gravity, Urine: 1.041 — ABNORMAL HIGH (ref 1.005–1.030)
Squamous Epithelial / HPF: NONE SEEN (ref 0–5)
WBC, UA: >50 WBC/hpf — ABNORMAL HIGH (ref 0–5)
pH: 9 — ABNORMAL HIGH (ref 5.0–8.0)

## 2021-04-17 LAB — BLOOD CULTURE ID PANEL (REFLEXED) - BCID2

## 2021-04-17 LAB — LACTIC ACID, PLASMA
Lactic Acid, Venous: 2.6 mmol/L (ref 0.5–1.9)
Lactic Acid, Venous: 2.9 mmol/L (ref 0.5–1.9)
Lactic Acid, Venous: 1.6 mmol/L (ref 0.5–1.9)

## 2021-04-17 LAB — CBC WITH DIFFERENTIAL/PLATELET
Abs Immature Granulocytes: 0.09 10*3/uL — ABNORMAL HIGH (ref 0.00–0.07)
Basophils Absolute: 0 10*3/uL (ref 0.0–0.1)
Basophils Relative: 0 %
Eosinophils Absolute: 0 10*3/uL (ref 0.0–0.5)
Eosinophils Relative: 0 %
HCT: 29.3 % — ABNORMAL LOW (ref 39.0–52.0)
Hemoglobin: 9.9 g/dL — ABNORMAL LOW (ref 13.0–17.0)
Immature Granulocytes: 1 %
Lymphocytes Relative: 6 %
Lymphs Abs: 0.7 10*3/uL (ref 0.7–4.0)
MCH: 34.3 pg — ABNORMAL HIGH (ref 26.0–34.0)
MCHC: 33.8 g/dL (ref 30.0–36.0)
MCV: 101.4 fL — ABNORMAL HIGH (ref 80.0–100.0)
Monocytes Absolute: 1.1 10*3/uL — ABNORMAL HIGH (ref 0.1–1.0)
Monocytes Relative: 10 %
Neutro Abs: 8.9 10*3/uL — ABNORMAL HIGH (ref 1.7–7.7)
Neutrophils Relative %: 83 %
Platelets: 303 10*3/uL (ref 150–400)
RBC: 2.89 MIL/uL — ABNORMAL LOW (ref 4.22–5.81)
RDW: 13.4 % (ref 11.5–15.5)
WBC: 10.8 10*3/uL — ABNORMAL HIGH (ref 4.0–10.5)
nRBC: 0 % (ref 0.0–0.2)

## 2021-04-17 LAB — COMPREHENSIVE METABOLIC PANEL
ALT: 20 U/L (ref 0–44)
AST: 30 U/L (ref 15–41)
Albumin: 2.7 g/dL — ABNORMAL LOW (ref 3.5–5.0)
Alkaline Phosphatase: 63 U/L (ref 38–126)
Anion gap: 13 (ref 5–15)
BUN: 30 mg/dL — ABNORMAL HIGH (ref 8–23)
CO2: 22 mmol/L (ref 22–32)
Calcium: 9.1 mg/dL (ref 8.9–10.3)
Chloride: 92 mmol/L — ABNORMAL LOW (ref 98–111)
Creatinine, Ser: 1.07 mg/dL (ref 0.61–1.24)
GFR, Estimated: >60 mL/min (ref >60)
Glucose, Bld: 126 mg/dL — ABNORMAL HIGH (ref 70–99)
Potassium: 4.9 mmol/L (ref 3.5–5.1)
Sodium: 127 mmol/L — ABNORMAL LOW (ref 135–145)
Total Bilirubin: 1.1 mg/dL (ref 0.3–1.2)
Total Protein: 6.8 g/dL (ref 6.5–8.1)

## 2021-04-17 LAB — PROTIME-INR
INR: 1.2 (ref 0.8–1.2)
Prothrombin Time: 14.8 seconds (ref 11.4–15.2)

## 2021-04-17 LAB — CBG MONITORING, ED: Glucose-Capillary: 126 mg/dL — ABNORMAL HIGH (ref 70–99)

## 2021-04-17 LAB — TROPONIN I (HIGH SENSITIVITY): Troponin I (High Sensitivity): 34 ng/L — ABNORMAL HIGH (ref <18)

## 2021-04-17 MED ORDER — SODIUM CHLORIDE 0.9 % IV BOLUS
500.0000 mL | Freq: Once | INTRAVENOUS | Status: AC
  Administered 2021-04-17: 500 mL via INTRAVENOUS

## 2021-04-17 MED ORDER — DILTIAZEM HCL 25 MG/5ML IV SOLN
20.0000 mg | Freq: Once | INTRAVENOUS | Status: AC
  Administered 2021-04-17: 20 mg via INTRAVENOUS
  Filled 2021-04-17: qty 5

## 2021-04-17 MED ORDER — VANCOMYCIN HCL 1250 MG/250ML IV SOLN
1250.0000 mg | Freq: Once | INTRAVENOUS | Status: DC
  Filled 2021-04-17: qty 250

## 2021-04-17 MED ORDER — IOHEXOL 350 MG/ML SOLN
100.0000 mL | Freq: Once | INTRAVENOUS | Status: AC | PRN
  Administered 2021-04-17: 100 mL via INTRAVENOUS

## 2021-04-17 MED ORDER — PRAVASTATIN SODIUM 20 MG PO TABS: 80.0000 mg | ORAL_TABLET | Freq: Every day | ORAL | Status: DC

## 2021-04-17 MED ORDER — VANCOMYCIN HCL 750 MG/150ML IV SOLN
750.0000 mg | Freq: Once | INTRAVENOUS | Status: AC
  Administered 2021-04-17: 750 mg via INTRAVENOUS
  Filled 2021-04-17: qty 150

## 2021-04-17 MED ORDER — SODIUM CHLORIDE 0.9 % IV BOLUS
1000.0000 mL | Freq: Once | INTRAVENOUS | Status: AC
  Administered 2021-04-17: 1000 mL via INTRAVENOUS

## 2021-04-17 MED ORDER — VANCOMYCIN HCL 2000 MG/400ML IV SOLN
2000.0000 mg | Freq: Once | INTRAVENOUS | Status: DC
  Filled 2021-04-17 (×2): qty 400

## 2021-04-17 MED ORDER — GABAPENTIN 300 MG PO CAPS
300.0000 mg | ORAL_CAPSULE | Freq: Three times a day (TID) | ORAL | Status: DC
  Administered 2021-04-18: 300 mg via ORAL
  Filled 2021-04-17: qty 1

## 2021-04-17 MED ORDER — DILTIAZEM HCL-DEXTROSE 125-5 MG/125ML-% IV SOLN (PREMIX)
5.0000 mg/h | INTRAVENOUS | Status: DC
  Administered 2021-04-17: 5 mg/h via INTRAVENOUS
  Administered 2021-04-18: 15 mg/h via INTRAVENOUS
  Filled 2021-04-17 (×2): qty 125

## 2021-04-17 MED ORDER — SODIUM CHLORIDE 0.9 % IV SOLN: INTRAVENOUS | Status: DC

## 2021-04-17 MED ORDER — ONDANSETRON HCL 4 MG/2ML IJ SOLN: 4.0000 mg | Freq: Four times a day (QID) | INTRAMUSCULAR | Status: DC | PRN

## 2021-04-17 MED ORDER — OXYCODONE HCL 5 MG PO TABS
5.0000 mg | ORAL_TABLET | ORAL | Status: DC | PRN
  Administered 2021-04-18: 5 mg via ORAL
  Filled 2021-04-17: qty 1

## 2021-04-17 MED ORDER — VANCOMYCIN HCL 1750 MG/350ML IV SOLN
1750.0000 mg | INTRAVENOUS | Status: DC
  Administered 2021-04-17: 1750 mg via INTRAVENOUS
  Filled 2021-04-17: qty 350

## 2021-04-17 MED ORDER — FOLIC ACID 1 MG PO TABS: 1.0000 mg | ORAL_TABLET | Freq: Every day | ORAL | Status: DC

## 2021-04-17 MED ORDER — PIPERACILLIN-TAZOBACTAM 3.375 G IVPB 30 MIN
3.3750 g | Freq: Once | INTRAVENOUS | Status: AC
  Administered 2021-04-17: 3.375 g via INTRAVENOUS
  Filled 2021-04-17: qty 50

## 2021-04-17 MED ORDER — PIPERACILLIN-TAZOBACTAM 3.375 G IVPB
3.3750 g | Freq: Three times a day (TID) | INTRAVENOUS | Status: DC
  Administered 2021-04-17 – 2021-04-18 (×2): 3.375 g via INTRAVENOUS
  Filled 2021-04-17 (×2): qty 50

## 2021-04-17 MED ORDER — AMLODIPINE BESYLATE 5 MG PO TABS: 2.5000 mg | ORAL_TABLET | Freq: Every day | ORAL | Status: DC

## 2021-04-17 MED ORDER — FUROSEMIDE 40 MG PO TABS: 20.0000 mg | ORAL_TABLET | Freq: Every day | ORAL | Status: DC

## 2021-04-17 MED ORDER — FERROUS SULFATE 325 (65 FE) MG PO TABS: 325.0000 mg | ORAL_TABLET | ORAL | Status: DC

## 2021-04-17 NOTE — ED Provider Notes (Signed)
Patient having notable difficulty getting IVs going.  His left arm is not usable due to what appears to be apparent cellulitis  Right hand and right upper arm IV appear to have infiltrated.  Based upon this I discussed with the patient, but also called his wife.  Discussed with his wife risks benefits indications and potential complications associated with central line insertion.  The patient's wife verbally consents.  She is at home.  She did also confirm consent with my nurse Caryl Pina via phone  Does agree to central line insertion himself as well.  But he is somewhat confused at this time, and concerned that he does not truly have the capacity to make decisions.  Also his wife reported to me that he is DO NOT RESUSCITATE, he would not wish to be intubated or receive life-saving CPR.  Okay to proceed with central line insertion  Location details: Left internal jugular  Catheter type: triple lumen Catheter size: 8 Fr Pre-procedure: landmarks identified Ultrasound guidance: Utilize Successful placement: yes Post-procedure: line sutured and dressing applied Assessment: blood return through all parts, free fluid flow, placement verified by x-ray and I personally reviewed and no pneumothorax on x-ray Patient tolerance: Patient tolerated the procedure well with no immediate complications.   DG Chest Portable 1 View  Result Date: 04/17/2021 CLINICAL DATA:  Status post central line placement EXAM: PORTABLE CHEST 1 VIEW COMPARISON:  Film from earlier in the same day. FINDINGS: New left jugular central line is noted with the catheter tip at the cavoatrial junction. No pneumothorax is noted. Cardiac shadow is stable. Postsurgical changes are again noted. Patchy airspace disease is again noted in the right mid lung. No new focal abnormality is noted. IMPRESSION: No pneumothorax following central line placement on the left. Electronically Signed   By: Inez Catalina M.D.   On: 04/17/2021 21:53    Delman Kitten, MD 04/17/21 2200

## 2021-04-17 NOTE — Progress Notes (Signed)
Received VAST consult for PIV restart. Patient had 3 PIVs, all infiltrated in short period of time. Has cellulitis in left forearm including mid upper arm from previous PIV. Right forearm and right upper arm with 3+edema. Assessed with ultrasound.No suitable veins found. Patient scheduled to receive Cardizem drip and Vancomycin. Notified primary RN Delilah Shan and recommend central line.

## 2021-04-17 NOTE — ED Notes (Signed)
Pt's 22g in right forearm infiltrated.  IV fluids stopped.  Warm compress placed on arm.  IV team consulted.

## 2021-04-17 NOTE — ED Notes (Signed)
Pt brought back from CT where they informed this RN that the 18g in his Right AC infiltrated.  Per imaging, they were able to see some of the contrast in the right arm from infiltration.

## 2021-04-17 NOTE — ED Notes (Addendum)
RN assumed care from [place], Therapist, sports. RN at bedside. Admitting MD at bedside. MD aware by prior RN pt does not have IV access due to IV infiltration in right arm and previous admission cellulitis in right arm

## 2021-04-17 NOTE — ED Notes (Signed)
EDP, [place]

## 2021-04-17 NOTE — Progress Notes (Signed)
Pharmacy Antibiotic Note  [name] is a 85 y.o. male admitted on 04/17/2021 with sepsis. Pt was discharged from [facility] yesterday (11/4). Pharmacy has been consulted for vancomycin and Zosyn dosing.  Plan: Zosyn 3.375g IV q8h (4 hour infusion). Pt only received 750 mg IV x1 from planned 2 g loading dose (per nurse, pt lost IV access). Will start maintenance dose of 1750 mg IV q24h Est AUC: 488.9 Used: Scr 1.07, Vd 0.72, IBW Obtain vanc levels around 4th or 5th dose if continued Monitor renal function and adjust dose as clinically indicated  Height: 6' (182.9 cm) Weight: 95.5 kg (210 lb 8.6 oz) IBW/kg (Calculated) : 77.6  Temp (24hrs), Avg:98.1 F (36.7 C), Min:98.1 F (36.7 C), Max:98.1 F (36.7 C)  Recent Labs  Lab 04/17/21 0856 04/17/21 0947 04/17/21 1158  WBC 10.8*  --   --   CREATININE 1.07  --   --   LATICACIDVEN 2.6* 2.9* 1.6    Estimated Creatinine Clearance: 62.7 mL/min (by C-G formula based on SCr of 1.07 mg/dL).    Allergies  Allergen Reactions   Codeine Nausea And Vomiting   Indocin [Indomethacin] Other (See Comments)   Latex Other (See Comments)    Blisters   Mupirocin     Blisters   Plavix [Clopidogrel Bisulfate]     GI intolerance, n/v   Polysporin [Bacitracin-Polymyxin B]     Blisters   Shellfish Allergy Nausea And Vomiting    Mussels   Tape    Neosporin [Neomycin-Bacitracin Zn-Polymyx] Rash   Other Nausea And Vomiting    mussels    Antimicrobials this admission: 11/5 vancomycin >>  11/5 Zosyn >>   Dose adjustments this admission:   Microbiology results: 11/5 BCx: sent 11/5 UCx: sent   Thank you for allowing pharmacy to be a part of this patient's care.  Forde Dandy Tekeya Geffert 04/17/2021 4:51 PM

## 2021-04-17 NOTE — ED Notes (Signed)
Pt transported to CT ?

## 2021-04-17 NOTE — Progress Notes (Signed)
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  Patient was recently hospitalized at [facility] for frequent falls, was found to have acute on chronic subdural hematoma, trauma scan CT chest/abdomen/pelvis on 10/29 did not show significant etiology.  Patient was discharged home yesterday.  Today patient came in with new onset of fever and respiratory distress was found to have respiratory failure now stable on 3 L, fever 100.9, and rapid A. fib heart rate 130s-140s.  ED rescan patient's chest and found no PE but a large 5.6 x3.1 cm anterior mediastinum fluid collection with gas, with a transverse fracture of sternum which causing the above-mentioned collection, although there is no signs of active extravasation. There are also nodules on both right and left upper lobes indicating septic emboli.  (I had a discussion with the ED physician and both of [place] believe given the finding of gas in it, infection/abscess can not be ruled out and now patient is on Vanco and Zosyn). I also asked the ED to consult CT surgery at [facility] at once to let them know patient is on his way here.  For a-fib, patient is on Cardizem drip.  Very sick patient, [place] talked ICU at [facility], who recommend patient admit to Step down. Inform CT surgery ASAP when patient arrives.

## 2021-04-17 NOTE — ED Provider Notes (Signed)
Patient exam remains unchanged except now developing some mild hypotension.  The setting of sepsis ordered additional fluid bolus 500 mL plus additional hourly rate.  Patient continues to await bed available at [facility].   Delman Kitten, MD 04/17/21 [phone]

## 2021-04-17 NOTE — ED Notes (Signed)
Pt has large hematoma to the left hip circled in purple pen, prior to arrival.  Pt also had left arm that is red, edematous, and draining purulent drainage from the [facility] prior to arrival.  Bandage found still in place covered with blood and purulent drainage. Pt also has skin breakdown on the posterior side of the right upper arm prior to discharge.  Pt has bruising to bilateral knees from previous fall.

## 2021-04-17 NOTE — ED Provider Notes (Signed)
.  Central Line  Date/Time: 04/17/2021 9:57 PM Performed by: Delman Kitten, MD Authorized by: Delman Kitten, MD   Consent:    Consent obtained:  Verbal   Consent given by:  Spouse   Risks discussed:  Arterial puncture, bleeding, infection, pneumothorax and nerve damage   Alternatives discussed:  No treatment Pre-procedure details:    Hand hygiene: Hand hygiene performed prior to insertion     Sterile barrier technique: All elements of maximal sterile technique followed     Skin preparation:  2% chlorhexidine   Skin preparation agent: Skin preparation agent completely dried prior to procedure   Procedure details:    Location:  R internal jugular and L internal jugular   Patient position:  Trendelenburg   Procedural supplies:  Triple lumen   Landmarks identified: yes     Ultrasound guidance: yes     Ultrasound guidance timing: prior to insertion and real time     Sterile ultrasound techniques: Sterile gel and sterile probe covers were used     Number of attempts:  1   Successful placement: yes   Post-procedure details:    Post-procedure:  Dressing applied and line sutured   Assessment:  Blood return through all ports and free fluid flow   Procedure completion:  Tolerated well, no immediate complications       Delman Kitten, MD 04/17/21 2159

## 2021-04-17 NOTE — ED Notes (Signed)
Radiology at bedside

## 2021-04-17 NOTE — ED Notes (Signed)
IV team present at bedside.

## 2021-04-17 NOTE — ED Provider Notes (Signed)
Vitals:   04/17/21 1530 04/17/21 1545  BP: (!) 122/96 (!) 122/96  Pulse:  (!) 119  Resp: (!) 23 (!) 24  Temp:    SpO2:  100%     Rounded on patient at this time.  He is resting, not in distress.  He is able to verbalize that he feels comfortable he does not have any requests at this time and does understand plan to transfer to [place].  Currently alert remains slightly tachycardic normotensive alert and oriented.  No noted distress at this time   Delman Kitten, MD 04/17/21 1652

## 2021-04-17 NOTE — ED Triage Notes (Signed)
Pt comes into the ED via [facility] from home c/o sepsis.  Pt was d/c from [facility] yesterday where he was admitted for a fall.  Pt was found at home with RA saturation in the 50's.  Per home health aid that was at home, pt is altered from baseline.  Pt has bruising to bilateral knees and his left arm is bright red in color with purulent drainage coming from an old IV site.  Pt also has multiple areas of skin breakdown on his right arm.  Pt brought in on 15L non-rebreather and in a-fib with a rate of 130-150's.    149/89 30 RR 100.9 axillary

## 2021-04-17 NOTE — ED Provider Notes (Signed)
Ongoing care assigned to Dr. Leonides Schanz.  Patient currently pending transfer to [facility], hospitalist service for ongoing treatment of suspected sepsis.  Blood culture panel has now resulted positive and patient being covered with Zosyn and vancomycin.   Delman Kitten, MD 04/17/21 2326

## 2021-04-17 NOTE — ED Provider Notes (Addendum)
[facility]  ____________________________________________   Event Date/Time   First MD Initiated Contact with Patient 04/17/21 0902     (approximate)  I have reviewed the triage vital signs and the nursing notes.   HISTORY  Chief Complaint Code Sepsis    HPI Levaughn Puccinelli is a 85 y.o. male with past medical history of ADD, polyneuropathy, chronic hyponatremia, dementia with frequent falls, recent subdural hematoma who presents with altered mental status.  Patient apparently was discharged from [facility] yesterday.  From record review and care everywhere he had had a fall with loss of consciousness and was found to have a subdural hematoma.  His presentation is complicated by tachycardia confusion and hypoxia.  Ultimately they recommended SNF but they decided on discharge with home health 24/7 support from his spouse.  Patient has had multiple frequent falls, which is thought to be due in part to his peripheral neuropathy foot drop and that he does not use a walker with some baseline cognitive deficits.  There is also polypharmacy at play.  He had an echo that did not show any evidence of valvular disease and did not have any arrhythmia on telemetry.  Other than his subdural hematoma he was also found to have a hematoma of the left gluteus medius and maximus had significant pain so shaded.  He was also found to be in urinary retention had a Foley placed.  She was tachycardic in the hospital but this was apparently sinus.  Was also noted at discharge that he had a left AC thrombophlebitis where an old IV had been.  His aspirin was held due to his subdural hematoma.  Patient is currently debilitated and altered, and unable to obtain additional history from the patient.         Past Medical History:  Diagnosis Date   Arthritis    Benign essential tremor    Fracture, femur ([place]) 2012   GERD (gastroesophageal reflux disease)    H/O echocardiogram    05/2013   Hx of  colonoscopy    04/22/2013   Hypercholesterolemia    Hypertension    Myocardial infarction [facility])    1987   Polyneuropathy    Prostate cancer ([place]) 2004   Superficial hematoma     Patient Active Problem List   Diagnosis Date Noted   Atrial fibrillation with RVR ([place]) 04/17/2021   DDD (degenerative disc disease), lumbar 03/03/2021   Cellulitis 02/18/2021   Lumbar compression fracture ([place]) 02/18/2021   Compression fracture of first lumbar vertebra ([place]) 02/17/2021   Cellulitis and abscess of foot    MRSA bacteremia 08/10/2020   Sepsis ([place]) 04/24/2020   Cellulitis of knee, left 04/22/2020   Hypokalemia 04/22/2020   Osteoarthritis of multiple joints 12/15/2017   Hyponatremia 03/28/2017   Abnormality of gait and mobility 01/11/2017   Cloudy vision 01/11/2017   Foot drop, bilateral 01/11/2017   Amputee, great toe, left ([place]) 01/11/2017   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   Macrocytic anemia 09/22/2015   Moderate tricuspid insufficiency 04/21/2015   History of prostate cancer 04/16/2015   Idiopathic peripheral neuropathy 04/16/2015   Essential hypertension 04/16/2015   GERD (gastroesophageal reflux disease) 04/16/2015   Arthritis 04/16/2015   Bilateral lower extremity edema 04/16/2015   Benign essential tremor 10/30/2014   Hyperlipidemia, mixed 44/06/270   Chronic systolic CHF (congestive heart failure), NYHA class 2 ([place]) 04/23/2014   Mitral insufficiency 11/05/2013   CAD (coronary artery disease) 11/01/2013   Basal cell  carcinoma of skin of other parts of face 03/14/2013   Ankle sprain 09/09/2011   History of fibula fracture 09/09/2011    Past Surgical History:  Procedure Laterality Date   APPLICATION OF WOUND VAC Left 08/12/2020   Procedure: APPLICATION OF WOUND VAC;  Surgeon: Samara Deist, DPM;  Location: [facility] ORS;  Service: Podiatry;  Laterality: Left;   BASAL CELL CARCINOMA EXCISION     COLONOSCOPY  05/01/2009   Positive for colonic polyps    COLONOSCOPY WITH PROPOFOL N/A 02/24/2016   Procedure: COLONOSCOPY WITH PROPOFOL;  Surgeon: Christene Lye, MD;  Location: [facility] ENDOSCOPY;  Service: Endoscopy;  Laterality: N/A;   CORONARY ARTERY BYPASS GRAFT  1987   X2   HERNIA REPAIR  1990   KYPHOPLASTY N/A 02/19/2021   Procedure: KYPHOPLASTY-L2&L3;  Surgeon: Hessie Knows, MD;  Location: [facility] ORS;  Service: Orthopedics;  Laterality: N/A;   PROSTATE SURGERY  2004   TEE WITHOUT CARDIOVERSION N/A 08/13/2020   Procedure: TRANSESOPHAGEAL ECHOCARDIOGRAM (TEE);  Surgeon: Corey Skains, MD;  Location: [facility] ORS;  Service: Cardiovascular;  Laterality: N/A;   WOUND DEBRIDEMENT Left 08/12/2020   Procedure: DEBRIDEMENT WOUND-Left Heel;  Surgeon: Samara Deist, DPM;  Location: [facility] ORS;  Service: Podiatry;  Laterality: Left;    Prior to Admission medications   Medication Sig Start Date End Date Taking? Authorizing Provider  ferrous sulfate 325 (65 FE) MG tablet Take 1 tablet by mouth every other day. 04/16/21 04/16/22 Yes [provider]  Alpha Lipoic Acid 200 MG CAPS Take 2 capsules by mouth daily.    [provider]  amLODipine (NORVASC) 2.5 MG tablet TAKE 1 TABLET BY MOUTH  DAILY 01/08/21   Karamalegos, Devonne Doughty, DO  Apoaequorin 10 MG CAPS Take 1 capsule by mouth as directed.    [provider]  aspirin 81 MG tablet Take 162 mg by mouth at bedtime.    [provider]  Azelastine HCl 137 MCG/SPRAY SOLN PLACE 1 SPRAY INTO BOTH NOSTRILS 2 (TWO) TIMES DAILY. USE IN EACH NOSTRIL AS DIRECTED 11/07/20   Parks Ranger, Devonne Doughty, DO  B Complex Vitamins (VITAMIN-B COMPLEX PO) Take 1 tablet by mouth daily.    [provider]  Biotin 1000 MCG tablet Take 1,000 mcg by mouth daily.    [provider]  Calcium Carb-Cholecalciferol 500-100 MG-UNIT CHEW Chew 500 mg by mouth daily.    [provider]  cephALEXin (KEFLEX) 500 MG capsule Take 1 capsule (500 mg total) by mouth 3 (three) times daily.  03/22/21   Rodriguez-Southworth, Sunday Spillers, PA-C  chlorhexidine (HIBICLENS) 4 % external liquid Apply topically daily. Use to wash your body below neck 02/04/21   Tsosie Billing, MD  cyanocobalamin 1000 MCG tablet Take 1,000 mcg by mouth daily.    [provider]  cyclobenzaprine (FLEXERIL) 10 MG tablet Take 0.5-1 tablets (5-10 mg total) by mouth at bedtime as needed for muscle spasms. 03/25/21   Karamalegos, Devonne Doughty, DO  diphenhydrAMINE (BENADRYL) 25 MG tablet Take 25 mg by mouth at bedtime as needed for allergies.    [provider]  folic acid (FOLVITE) 1 MG tablet Take 1 mg by mouth daily.    [provider]  furosemide (LASIX) 20 MG tablet TAKE 1 TABLET BY MOUTH  DAILY 01/08/21   Parks Ranger, Devonne Doughty, DO  gabapentin (NEURONTIN) 300 MG capsule Take 300 mg by mouth 3 (three) times daily. 04/15/21   [provider]  gabapentin (NEURONTIN) 600 MG tablet TAKE 2 TABLETS BY MOUTH  TWICE  DAILY 01/08/21   Karamalegos, Devonne Doughty, DO  gentamicin cream (GARAMYCIN) 0.1 % APPLY TO AFFECTED AREA 1 TIMES DAILY 12/03/20   [provider]  Glucosamine Sulfate 1000 MG CAPS Take 1 capsule by mouth daily.    [provider]  Lecithin 400 MG CAPS Take 400 mg by mouth daily.    [provider]  LECITHIN-19 PO Take 4 tablets by mouth daily.    [provider]  losartan (COZAAR) 50 MG tablet Take 50 mg by mouth daily. 02/09/21   [provider]  melatonin 5 MG TABS Take 5 mg by mouth at bedtime.    [provider]  Omega 3 1000 MG CAPS Take 1,000 mg by mouth daily.    [provider]  omeprazole (PRILOSEC) 20 MG capsule TAKE 1 CAPSULE BY MOUTH  TWICE DAILY BEFORE MEALS 01/08/21   Karamalegos, Devonne Doughty, DO  oxyCODONE (OXY IR/ROXICODONE) 5 MG immediate release tablet Take 1 tablet by mouth every 4 (four) hours as needed. 04/15/21   [provider]  potassium chloride SA (KLOR-CON) 20 MEQ tablet Take 1  tablet (20 mEq total) by mouth 2 (two) times daily. 09/30/20   Karamalegos, Devonne Doughty, DO  pravastatin (PRAVACHOL) 80 MG tablet TAKE 1 TABLET BY MOUTH  DAILY 01/08/21   Parks Ranger, Devonne Doughty, DO  primidone (MYSOLINE) 50 MG tablet Take 1-2 tablets (50-100 mg total) by mouth See admin instructions. Take 2 tablets (100mg ) by mouth every morning and take 1 tablet (50mg ) by mouth every night 01/08/21   Karamalegos, Devonne Doughty, DO  promethazine (PHENERGAN) 12.5 MG tablet Take 1-2 tablets (12.5-25 mg total) by mouth every 8 (eight) hours as needed for nausea or vomiting. Caution sedation. 01/28/21   Karamalegos, Devonne Doughty, DO  Selenium 200 MCG CAPS Take 200 mcg by mouth daily.    [provider]  traMADol (ULTRAM) 50 MG tablet Take 1-2 tablets (50-100 mg total) by mouth every 8 (eight) hours as needed. 03/25/21   Karamalegos, Devonne Doughty, DO  tretinoin (RETIN-A) 0.05 % cream Apply 1 application topically at bedtime.    [provider]  tretinoin (RETIN-A) 0.05 % cream Apply 1 application topically daily as needed.    [provider]  triamcinolone (KENALOG) 0.1 % Apply 1 application topically 2 (two) times daily. (apply to hands, legs and feet) 07/27/20   [provider]    Allergies Codeine, Indocin [indomethacin], Latex, Mupirocin, Plavix [clopidogrel bisulfate], Polysporin [bacitracin-polymyxin b], Shellfish allergy, Tape, Neosporin [neomycin-bacitracin zn-polymyx], and Other  Family History  Problem Relation Age of Onset   Heart disease Mother    Congestive Heart Failure Father     Social History Social History   Tobacco Use   Smoking status: Never   Smokeless tobacco: Never  Vaping Use   Vaping Use: Never used  Substance Use Topics   Alcohol use: Yes    Alcohol/week: 6.0 standard drinks    Types: 6 Glasses of wine per week    Comment: wine in the evening with dinner   Drug use: No    Review of Systems   Review of Systems  Physical  Exam Updated Vital Signs BP (!) 122/96   Pulse (!) 119   Temp 98.1 F (36.7 C) (Oral)   Resp (!) 24   Ht 6' (1.829 m)   Wt 95.5 kg   SpO2 100%   BMI 28.55 kg/m   Physical Exam Vitals and nursing note reviewed.  Constitutional:      General:  He is not in acute distress.    Appearance: He is ill-appearing and diaphoretic.     Comments: Patient is awake but confused, he is not able to provide significant history He is tachypneic unwell appearing  HENT:     Head: Normocephalic and atraumatic.     Mouth/Throat:     Mouth: Mucous membranes are dry.  Eyes:     General: No scleral icterus.    Conjunctiva/sclera: Conjunctivae normal.  Cardiovascular:     Rate and Rhythm: Tachycardia present. Rhythm irregular.  Pulmonary:     Effort: Pulmonary effort is normal. No respiratory distress.     Breath sounds: Normal breath sounds. No wheezing.     Comments: Patient is tachypneic Abdominal:     General: There is distension.     Tenderness: There is abdominal tenderness.     Comments: Abdomen is distended, there is a reducible umbilical hernia, abdomen is distended and he grimaces to palpation throughout  Genitourinary:    Penis: Normal.      Comments: Foley catheter in place, no evidence of GU infection Musculoskeletal:        General: No deformity or signs of injury.     Cervical back: Normal range of motion.  Skin:    General: Skin is warm.     Coloration: Skin is not jaundiced or pale.     Comments: Bilateral knees with somewhat mottled appearance, unclear if this is ecchymosis  Neurological:     Comments: Opens eyes to voice, able to provide 1-2 word answers to questions, not oriented  Psychiatric:     Comments: Unable to assess     LABS (all labs ordered are listed, but only abnormal results are displayed)  Labs Reviewed  COMPREHENSIVE METABOLIC PANEL - Abnormal; Notable for the following components:      Result Value   Sodium 127 (*)    Chloride 92 (*)    Glucose,  Bld 126 (*)    BUN 30 (*)    Albumin 2.7 (*)    All other components within normal limits  LACTIC ACID, PLASMA - Abnormal; Notable for the following components:   Lactic Acid, Venous 2.6 (*)    All other components within normal limits  LACTIC ACID, PLASMA - Abnormal; Notable for the following components:   Lactic Acid, Venous 2.9 (*)    All other components within normal limits  CBC WITH DIFFERENTIAL/PLATELET - Abnormal; Notable for the following components:   WBC 10.8 (*)    RBC 2.89 (*)    Hemoglobin 9.9 (*)    HCT 29.3 (*)    MCV 101.4 (*)    MCH 34.3 (*)    Neutro Abs 8.9 (*)    Monocytes Absolute 1.1 (*)    Abs Immature Granulocytes 0.09 (*)    All other components within normal limits  URINALYSIS, ROUTINE W REFLEX MICROSCOPIC - Abnormal; Notable for the following components:   Color, Urine YELLOW (*)    APPearance CLOUDY (*)    Specific Gravity, Urine 1.041 (*)    pH 9.0 (*)    Hgb urine dipstick SMALL (*)    Ketones, ur 5 (*)    Protein, ur 100 (*)    Leukocytes,Ua LARGE (*)    WBC, UA >50 (*)    Bacteria, UA FEW (*)    All other components within normal limits  TROPONIN I (HIGH SENSITIVITY) - Abnormal; Notable for the following components:   Troponin I (High Sensitivity) 34 (*)  All other components within normal limits  CULTURE, BLOOD (ROUTINE X 2)  CULTURE, BLOOD (ROUTINE X 2)  URINE CULTURE  PROTIME-INR  LACTIC ACID, PLASMA   ____________________________________________  EKG  Atrial fibrillation with rapid ventricular rate, right axis ____________________________________________  RADIOLOGY I, Madelin Headings, personally viewed and evaluated these images (plain radiographs) as part of my medical decision making, as well as reviewing the written report by the radiologist.  ED MD interpretation: I reviewed the chest x-ray which shows a right upper lobe infiltrate   ____________________________________________   PROCEDURES  Procedure(s) performed  (including Critical Care):  .Critical Care Performed by: Rada Hay, MD Authorized by: Rada Hay, MD   Critical care provider statement:    Critical care time (minutes):  45   Critical care was necessary to treat or prevent imminent or life-threatening deterioration of the following conditions:  Sepsis and circulatory failure   Critical care was time spent personally by me on the following activities:  Development of treatment plan with patient or surrogate, discussions with consultants, evaluation of patient's response to treatment, examination of patient, ordering and performing treatments and interventions, ordering and review of laboratory studies, ordering and review of radiographic studies, pulse oximetry, re-evaluation of patient's condition and review of old charts   I assumed direction of critical care for this patient from another provider in my specialty: no     Care discussed with: admitting provider   .1-3 Lead EKG Interpretation Performed by: Rada Hay, MD Authorized by: Rada Hay, MD     Interpretation: abnormal     ECG rate assessment: tachycardic     Rhythm: atrial fibrillation     Ectopy: none     Conduction: normal     ____________________________________________   INITIAL IMPRESSION / ASSESSMENT AND PLAN / ED COURSE     Patient is a 85 year old male who has a past medical history significant for baseline disability and frequent falls who presents with altered mental status after recent admission to [facility] where he was found to have a subdural hematoma after fall.  On arrival patient is tachycardic with an irregular rate appears to be A. fib with RVR on the monitor, he is tachypneic but afebrile and his blood pressure is normal.  He appears unwell, pale with mottling around his knees.  His abdomen is distended and diffusely tender, there are Steri-Strips over the right upper quadrant, clear if he had a recent surgery.  The left upper  extremity is diffusely swollen red and warm concerning for a DVT versus cellulitis.  On review of his recent admission looks like he did have hypoxia confusion and tachycardia while in the hospital but all of this seemed to resolve by the time he was discharged.  He was supposed to go to SNF but they elected for home health.  Patient's chest x-ray look like there is a new right sided infiltrate in the mid to upper lung zones.  I obtained a CT angio of the chest to rule out pulmonary embolism and to further evaluate his lung parenchyma which is notable for a sternal fracture with some hematoma posterior to it as well as cavitary lesions in the lungs concerning for septic emboli.  His CT of the abdomen does show constipation but no obstruction.  There are also compression fractures of multiple thoracic and lumbar spinal levels with some retropulsion.  Patient's neurologic exam is difficult given his chronic neuropathy and foot drop so would likely benefit from an MRI  to evaluate for any cord involvement.  There is also note that some of the fractures appear pathologic and possibly concerning for osteomyelitis.  He had redemonstrates his subdural hematoma and a possibly new subdural hygroma which does not have any mass-effect.  Given these new findings and his clinical deterioration I attempted to get the patient back to [facility] where he was just discharged from but unfortunately they were full to all transfers.  Discussed with trauma attending at [facility] the sternal fracture with hematoma and he did not feel that it was clinically significant and it did not feel that he would need transfer to a trauma center.  Given this I feel that he is stable to stay at [facility] in a monitored bed.  Overall I feel that he is septic pneumonia at the very least but there is concern for septic emboli and potential osteomyelitis.  He has received fluids broad-spectrum antibiotics and has had blood cultures.  Will likely need an echo to  evaluate for endocarditis.  We will also start him on a dill drip given his ongoing tachycardia.  Discussed the case with [facility] hospitalist who feels that he would be at [facility] or [facility] where he could have CT surgery evaluation.  Discussed with ICU at [facility] who feels that he be appropriate for stepdown unit.  Patient was then accepted at [facility] by Dr. Roosevelt Locks to the hospital service.  Clinical Course as of 04/17/21 1620  Sat Apr 17, 2021  4656 Lactic Acid, Venous(!!): 2.6 [KM]  1028 Troponin I (High Sensitivity)(!): 34 [KM]    Clinical Course User Index [KM] Rada Hay, MD     ____________________________________________   FINAL CLINICAL IMPRESSION(S) / ED DIAGNOSES  Final diagnoses:  Arm swelling  Healthcare-associated pneumonia  Septic embolism (HCC)  Closed fracture of sternum, unspecified portion of sternum, initial encounter  Urinary tract infection without hematuria, site unspecified     ED Discharge Orders     None        Note:  This document was prepared using Dragon voice recognition software and may include unintentional dictation errors.    Rada Hay, MD 04/17/21 1400    Rada Hay, MD 04/17/21 1620

## 2021-04-17 NOTE — Progress Notes (Signed)
PHARMACY - PHYSICIAN COMMUNICATION CRITICAL VALUE ALERT - BLOOD CULTURE IDENTIFICATION (BCID)  [name] is an 85 y.Marcus. male who presented to [facility] on 04/17/2021 with a chief complaint of altered mental status admitted for sepsis.  Assessment:  4/4 bottles GPC - Staph aureus with MecA/C and MREJ detected.   Name of physician (or Provider) Contacted: Rachael Fee  Current antibiotics: Zosyn and Vanc  Changes to prescribed antibiotics recommended:  Patient is on recommended antibiotics - continue vancomycin; per NP, keep Zosyn for now since wound cannot be ruled out for gas presence   Results for orders placed or performed during the hospital encounter of 08/09/20  Blood Culture ID Panel (Reflexed) (Collected: 08/09/2020  3:49 PM)  Result Value Ref Range   Enterococcus faecalis NOT DETECTED NOT DETECTED   Enterococcus Faecium NOT DETECTED NOT DETECTED   Listeria monocytogenes NOT DETECTED NOT DETECTED   Staphylococcus species DETECTED (A) NOT DETECTED   Staphylococcus aureus (BCID) DETECTED (A) NOT DETECTED   Staphylococcus epidermidis NOT DETECTED NOT DETECTED   Staphylococcus lugdunensis NOT DETECTED NOT DETECTED   Streptococcus species NOT DETECTED NOT DETECTED   Streptococcus agalactiae NOT DETECTED NOT DETECTED   Streptococcus pneumoniae NOT DETECTED NOT DETECTED   Streptococcus pyogenes NOT DETECTED NOT DETECTED   A.calcoaceticus-baumannii NOT DETECTED NOT DETECTED   Bacteroides fragilis NOT DETECTED NOT DETECTED   Enterobacterales NOT DETECTED NOT DETECTED   Enterobacter cloacae complex NOT DETECTED NOT DETECTED   Escherichia coli NOT DETECTED NOT DETECTED   Klebsiella aerogenes NOT DETECTED NOT DETECTED   Klebsiella oxytoca NOT DETECTED NOT DETECTED   Klebsiella pneumoniae NOT DETECTED NOT DETECTED   Proteus species NOT DETECTED NOT DETECTED   Salmonella species NOT DETECTED NOT DETECTED   Serratia marcescens NOT DETECTED NOT DETECTED   Haemophilus influenzae NOT  DETECTED NOT DETECTED   Neisseria meningitidis NOT DETECTED NOT DETECTED   Pseudomonas aeruginosa NOT DETECTED NOT DETECTED   Stenotrophomonas maltophilia NOT DETECTED NOT DETECTED   Candida albicans NOT DETECTED NOT DETECTED   Candida auris NOT DETECTED NOT DETECTED   Candida glabrata NOT DETECTED NOT DETECTED   Candida krusei NOT DETECTED NOT DETECTED   Candida parapsilosis NOT DETECTED NOT DETECTED   Candida tropicalis NOT DETECTED NOT DETECTED   Cryptococcus neoformans/gattii NOT DETECTED NOT DETECTED   Meth resistant mecA/C and MREJ DETECTED (A) NOT DETECTED    [name] [name] 04/17/2021  10:08 PM

## 2021-04-17 NOTE — ED Notes (Addendum)
EDP, Quale called to bedside to look at PIV access.  This makes the third PIV which has infiltrated.  EDP, Quale contacts wife; verbal consent received per EDP and this RN to move forward with Tax adviser.

## 2021-04-17 NOTE — ED Notes (Signed)
US at bedside

## 2021-04-18 ENCOUNTER — Inpatient Hospital Stay (HOSPITAL_COMMUNITY)
Admission: AD | Admit: 2021-04-18 | Discharge: 2021-05-13 | DRG: 853 | Disposition: E | Payer: Medicare Other | Source: Other Acute Inpatient Hospital | Attending: Internal Medicine | Admitting: Internal Medicine

## 2021-04-18 DIAGNOSIS — M4856XA Collapsed vertebra, not elsewhere classified, lumbar region, initial encounter for fracture: Secondary | ICD-10-CM | POA: Diagnosis present

## 2021-04-18 DIAGNOSIS — S32000A Wedge compression fracture of unspecified lumbar vertebra, initial encounter for closed fracture: Secondary | ICD-10-CM | POA: Diagnosis present

## 2021-04-18 DIAGNOSIS — L089 Local infection of the skin and subcutaneous tissue, unspecified: Secondary | ICD-10-CM | POA: Diagnosis not present

## 2021-04-18 DIAGNOSIS — B9562 Methicillin resistant Staphylococcus aureus infection as the cause of diseases classified elsewhere: Secondary | ICD-10-CM | POA: Diagnosis not present

## 2021-04-18 DIAGNOSIS — I6203 Nontraumatic chronic subdural hemorrhage: Secondary | ICD-10-CM | POA: Diagnosis present

## 2021-04-18 DIAGNOSIS — S21101A Unspecified open wound of right front wall of thorax without penetration into thoracic cavity, initial encounter: Secondary | ICD-10-CM | POA: Diagnosis not present

## 2021-04-18 DIAGNOSIS — T8149XA Infection following a procedure, other surgical site, initial encounter: Secondary | ICD-10-CM | POA: Diagnosis present

## 2021-04-18 DIAGNOSIS — M4624 Osteomyelitis of vertebra, thoracic region: Secondary | ICD-10-CM

## 2021-04-18 DIAGNOSIS — R7881 Bacteremia: Secondary | ICD-10-CM | POA: Diagnosis not present

## 2021-04-18 DIAGNOSIS — G9341 Metabolic encephalopathy: Secondary | ICD-10-CM | POA: Diagnosis present

## 2021-04-18 DIAGNOSIS — J189 Pneumonia, unspecified organism: Secondary | ICD-10-CM | POA: Diagnosis present

## 2021-04-18 DIAGNOSIS — R0609 Other forms of dyspnea: Secondary | ICD-10-CM | POA: Diagnosis not present

## 2021-04-18 DIAGNOSIS — I252 Old myocardial infarction: Secondary | ICD-10-CM

## 2021-04-18 DIAGNOSIS — Z7189 Other specified counseling: Secondary | ICD-10-CM | POA: Diagnosis not present

## 2021-04-18 DIAGNOSIS — E782 Mixed hyperlipidemia: Secondary | ICD-10-CM | POA: Diagnosis not present

## 2021-04-18 DIAGNOSIS — E876 Hypokalemia: Secondary | ICD-10-CM | POA: Diagnosis not present

## 2021-04-18 DIAGNOSIS — I808 Phlebitis and thrombophlebitis of other sites: Secondary | ICD-10-CM | POA: Diagnosis present

## 2021-04-18 DIAGNOSIS — S065XAA Traumatic subdural hemorrhage with loss of consciousness status unknown, initial encounter: Secondary | ICD-10-CM | POA: Diagnosis not present

## 2021-04-18 DIAGNOSIS — Z91013 Allergy to seafood: Secondary | ICD-10-CM

## 2021-04-18 DIAGNOSIS — I2510 Atherosclerotic heart disease of native coronary artery without angina pectoris: Secondary | ICD-10-CM | POA: Diagnosis not present

## 2021-04-18 DIAGNOSIS — G629 Polyneuropathy, unspecified: Secondary | ICD-10-CM | POA: Diagnosis present

## 2021-04-18 DIAGNOSIS — Z8249 Family history of ischemic heart disease and other diseases of the circulatory system: Secondary | ICD-10-CM

## 2021-04-18 DIAGNOSIS — E43 Unspecified severe protein-calorie malnutrition: Secondary | ICD-10-CM | POA: Diagnosis present

## 2021-04-18 DIAGNOSIS — D539 Nutritional anemia, unspecified: Secondary | ICD-10-CM | POA: Diagnosis present

## 2021-04-18 DIAGNOSIS — I2690 Septic pulmonary embolism without acute cor pulmonale: Secondary | ICD-10-CM

## 2021-04-18 DIAGNOSIS — C4359 Malignant melanoma of other part of trunk: Secondary | ICD-10-CM | POA: Diagnosis present

## 2021-04-18 DIAGNOSIS — R6520 Severe sepsis without septic shock: Secondary | ICD-10-CM | POA: Diagnosis present

## 2021-04-18 DIAGNOSIS — J853 Abscess of mediastinum: Secondary | ICD-10-CM | POA: Diagnosis present

## 2021-04-18 DIAGNOSIS — I76 Septic arterial embolism: Secondary | ICD-10-CM | POA: Diagnosis present

## 2021-04-18 DIAGNOSIS — Z85828 Personal history of other malignant neoplasm of skin: Secondary | ICD-10-CM

## 2021-04-18 DIAGNOSIS — J15212 Pneumonia due to Methicillin resistant Staphylococcus aureus: Secondary | ICD-10-CM | POA: Diagnosis not present

## 2021-04-18 DIAGNOSIS — Z7982 Long term (current) use of aspirin: Secondary | ICD-10-CM

## 2021-04-18 DIAGNOSIS — Z515 Encounter for palliative care: Secondary | ICD-10-CM | POA: Diagnosis not present

## 2021-04-18 DIAGNOSIS — R410 Disorientation, unspecified: Secondary | ICD-10-CM | POA: Diagnosis not present

## 2021-04-18 DIAGNOSIS — R296 Repeated falls: Secondary | ICD-10-CM | POA: Diagnosis present

## 2021-04-18 DIAGNOSIS — Z4659 Encounter for fitting and adjustment of other gastrointestinal appliance and device: Secondary | ICD-10-CM

## 2021-04-18 DIAGNOSIS — K567 Ileus, unspecified: Secondary | ICD-10-CM | POA: Diagnosis not present

## 2021-04-18 DIAGNOSIS — Z20822 Contact with and (suspected) exposure to covid-19: Secondary | ICD-10-CM | POA: Diagnosis present

## 2021-04-18 DIAGNOSIS — I10 Essential (primary) hypertension: Secondary | ICD-10-CM | POA: Diagnosis present

## 2021-04-18 DIAGNOSIS — S2220XA Unspecified fracture of sternum, initial encounter for closed fracture: Secondary | ICD-10-CM | POA: Diagnosis present

## 2021-04-18 DIAGNOSIS — R339 Retention of urine, unspecified: Secondary | ICD-10-CM | POA: Diagnosis not present

## 2021-04-18 DIAGNOSIS — A4102 Sepsis due to Methicillin resistant Staphylococcus aureus: Principal | ICD-10-CM | POA: Diagnosis present

## 2021-04-18 DIAGNOSIS — I4891 Unspecified atrial fibrillation: Secondary | ICD-10-CM | POA: Diagnosis present

## 2021-04-18 DIAGNOSIS — L02213 Cutaneous abscess of chest wall: Secondary | ICD-10-CM | POA: Diagnosis present

## 2021-04-18 DIAGNOSIS — R627 Adult failure to thrive: Secondary | ICD-10-CM | POA: Diagnosis present

## 2021-04-18 DIAGNOSIS — Z66 Do not resuscitate: Secondary | ICD-10-CM | POA: Diagnosis present

## 2021-04-18 DIAGNOSIS — Z6826 Body mass index (BMI) 26.0-26.9, adult: Secondary | ICD-10-CM

## 2021-04-18 DIAGNOSIS — W19XXXA Unspecified fall, initial encounter: Secondary | ICD-10-CM | POA: Diagnosis present

## 2021-04-18 DIAGNOSIS — A419 Sepsis, unspecified organism: Secondary | ICD-10-CM | POA: Diagnosis present

## 2021-04-18 DIAGNOSIS — K5900 Constipation, unspecified: Secondary | ICD-10-CM | POA: Diagnosis not present

## 2021-04-18 DIAGNOSIS — R1312 Dysphagia, oropharyngeal phase: Secondary | ICD-10-CM | POA: Diagnosis present

## 2021-04-18 DIAGNOSIS — F0392 Unspecified dementia, unspecified severity, with psychotic disturbance: Secondary | ICD-10-CM | POA: Diagnosis present

## 2021-04-18 DIAGNOSIS — M869 Osteomyelitis, unspecified: Secondary | ICD-10-CM | POA: Diagnosis not present

## 2021-04-18 DIAGNOSIS — Z8546 Personal history of malignant neoplasm of prostate: Secondary | ICD-10-CM | POA: Diagnosis not present

## 2021-04-18 DIAGNOSIS — T8130XA Disruption of wound, unspecified, initial encounter: Secondary | ICD-10-CM | POA: Diagnosis not present

## 2021-04-18 DIAGNOSIS — K219 Gastro-esophageal reflux disease without esophagitis: Secondary | ICD-10-CM | POA: Diagnosis present

## 2021-04-18 DIAGNOSIS — G250 Essential tremor: Secondary | ICD-10-CM | POA: Diagnosis present

## 2021-04-18 DIAGNOSIS — Z91048 Other nonmedicinal substance allergy status: Secondary | ICD-10-CM

## 2021-04-18 DIAGNOSIS — R140 Abdominal distension (gaseous): Secondary | ICD-10-CM | POA: Diagnosis not present

## 2021-04-18 DIAGNOSIS — Z01818 Encounter for other preprocedural examination: Secondary | ICD-10-CM

## 2021-04-18 DIAGNOSIS — Z91040 Latex allergy status: Secondary | ICD-10-CM

## 2021-04-18 DIAGNOSIS — A412 Sepsis due to unspecified staphylococcus: Secondary | ICD-10-CM | POA: Diagnosis not present

## 2021-04-18 DIAGNOSIS — Z79899 Other long term (current) drug therapy: Secondary | ICD-10-CM

## 2021-04-18 DIAGNOSIS — Z888 Allergy status to other drugs, medicaments and biological substances status: Secondary | ICD-10-CM

## 2021-04-18 DIAGNOSIS — Z951 Presence of aortocoronary bypass graft: Secondary | ICD-10-CM

## 2021-04-18 DIAGNOSIS — Z885 Allergy status to narcotic agent status: Secondary | ICD-10-CM

## 2021-04-18 LAB — CBC WITH DIFFERENTIAL/PLATELET
Abs Immature Granulocytes: 0 10*3/uL (ref 0.00–0.07)
Basophils Absolute: 0 10*3/uL (ref 0.0–0.1)
Basophils Relative: 0 %
Eosinophils Absolute: 0 10*3/uL (ref 0.0–0.5)
Eosinophils Relative: 0 %
HCT: 27.3 % — ABNORMAL LOW (ref 39.0–52.0)
Hemoglobin: 8.9 g/dL — ABNORMAL LOW (ref 13.0–17.0)
Lymphocytes Relative: 7 %
Lymphs Abs: 0.8 10*3/uL (ref 0.7–4.0)
MCH: 34.8 pg — ABNORMAL HIGH (ref 26.0–34.0)
MCHC: 32.6 g/dL (ref 30.0–36.0)
MCV: 106.6 fL — ABNORMAL HIGH (ref 80.0–100.0)
Monocytes Absolute: 0.6 10*3/uL (ref 0.1–1.0)
Monocytes Relative: 5 %
Neutro Abs: 10.2 10*3/uL — ABNORMAL HIGH (ref 1.7–7.7)
Neutrophils Relative %: 88 %
Platelets: 296 10*3/uL (ref 150–400)
RBC: 2.56 MIL/uL — ABNORMAL LOW (ref 4.22–5.81)
RDW: 14 % (ref 11.5–15.5)
WBC: 11.6 10*3/uL — ABNORMAL HIGH (ref 4.0–10.5)
nRBC: 0 % (ref 0.0–0.2)
nRBC: 0 /100 WBC

## 2021-04-18 LAB — COMPREHENSIVE METABOLIC PANEL
ALT: 24 U/L (ref 0–44)
AST: 43 U/L — ABNORMAL HIGH (ref 15–41)
Albumin: 2 g/dL — ABNORMAL LOW (ref 3.5–5.0)
Alkaline Phosphatase: 54 U/L (ref 38–126)
Anion gap: 13 (ref 5–15)
BUN: 23 mg/dL (ref 8–23)
CO2: 19 mmol/L — ABNORMAL LOW (ref 22–32)
Calcium: 8.9 mg/dL (ref 8.9–10.3)
Chloride: 100 mmol/L (ref 98–111)
Creatinine, Ser: 0.92 mg/dL (ref 0.61–1.24)
GFR, Estimated: >60 mL/min (ref >60)
Glucose, Bld: 131 mg/dL — ABNORMAL HIGH (ref 70–99)
Potassium: 3.8 mmol/L (ref 3.5–5.1)
Sodium: 132 mmol/L — ABNORMAL LOW (ref 135–145)
Total Bilirubin: 1.3 mg/dL — ABNORMAL HIGH (ref 0.3–1.2)
Total Protein: 6 g/dL — ABNORMAL LOW (ref 6.5–8.1)

## 2021-04-18 LAB — RESP PANEL BY RT-PCR (FLU A&B, COVID) ARPGX2
Influenza A by PCR: NEGATIVE
Influenza B by PCR: NEGATIVE
SARS Coronavirus 2 by RT PCR: NEGATIVE

## 2021-04-18 LAB — ABO/RH: ABO/RH(D): AB POS

## 2021-04-18 LAB — PROCALCITONIN: Procalcitonin: 3.69 ng/mL

## 2021-04-18 LAB — LACTIC ACID, PLASMA: Lactic Acid, Venous: 1.6 mmol/L (ref 0.5–1.9)

## 2021-04-18 LAB — TROPONIN I (HIGH SENSITIVITY)
Troponin I (High Sensitivity): 24 ng/L — ABNORMAL HIGH (ref <18)
Troponin I (High Sensitivity): 24 ng/L — ABNORMAL HIGH (ref <18)

## 2021-04-18 LAB — TYPE AND SCREEN
ABO/RH(D): AB POS
Antibody Screen: NEGATIVE

## 2021-04-18 MED ORDER — ACETAMINOPHEN 325 MG PO TABS
650.0000 mg | ORAL_TABLET | Freq: Four times a day (QID) | ORAL | Status: DC | PRN
  Filled 2021-04-18: qty 2

## 2021-04-18 MED ORDER — SODIUM CHLORIDE 0.9% FLUSH
3.0000 mL | Freq: Two times a day (BID) | INTRAVENOUS | Status: DC
  Administered 2021-04-18 – 2021-05-01 (×18): 3 mL via INTRAVENOUS

## 2021-04-18 MED ORDER — DILTIAZEM HCL-DEXTROSE 125-5 MG/125ML-% IV SOLN (PREMIX)
5.0000 mg/h | INTRAVENOUS | Status: DC
  Administered 2021-04-18 (×2): 15 mg/h via INTRAVENOUS
  Administered 2021-04-19: 10 mg/h via INTRAVENOUS
  Administered 2021-04-19 – 2021-04-23 (×11): 15 mg/h via INTRAVENOUS
  Administered 2021-04-23: 5 mg/h via INTRAVENOUS
  Filled 2021-04-18 (×14): qty 125

## 2021-04-18 MED ORDER — POLYETHYLENE GLYCOL 3350 17 G PO PACK: 17.0000 g | PACK | Freq: Every day | ORAL | Status: DC | PRN

## 2021-04-18 MED ORDER — ONDANSETRON HCL 4 MG/2ML IJ SOLN: 4.0000 mg | Freq: Four times a day (QID) | INTRAMUSCULAR | Status: DC | PRN

## 2021-04-18 MED ORDER — LACTATED RINGERS IV SOLN: INTRAVENOUS | Status: DC

## 2021-04-18 MED ORDER — PIPERACILLIN-TAZOBACTAM 3.375 G IVPB
3.3750 g | Freq: Three times a day (TID) | INTRAVENOUS | Status: DC
  Administered 2021-04-18 – 2021-04-19 (×4): 3.375 g via INTRAVENOUS
  Filled 2021-04-18 (×3): qty 50

## 2021-04-18 MED ORDER — VANCOMYCIN HCL 1750 MG/350ML IV SOLN
1750.0000 mg | INTRAVENOUS | Status: DC
  Filled 2021-04-18: qty 350

## 2021-04-18 MED ORDER — ACETAMINOPHEN 650 MG RE SUPP: 650.0000 mg | Freq: Four times a day (QID) | RECTAL | Status: DC | PRN

## 2021-04-18 MED ORDER — CHLORHEXIDINE GLUCONATE CLOTH 2 % EX PADS
6.0000 | MEDICATED_PAD | Freq: Every day | CUTANEOUS | Status: DC
  Administered 2021-04-18 – 2021-05-05 (×18): 6 via TOPICAL

## 2021-04-18 MED ORDER — HALOPERIDOL LACTATE 5 MG/ML IJ SOLN
2.0000 mg | Freq: Four times a day (QID) | INTRAMUSCULAR | Status: DC | PRN
  Administered 2021-04-28 – 2021-05-02 (×4): 2 mg via INTRAVENOUS
  Filled 2021-04-18 (×4): qty 1

## 2021-04-18 MED ORDER — ONDANSETRON HCL 4 MG PO TABS: 4.0000 mg | ORAL_TABLET | Freq: Four times a day (QID) | ORAL | Status: DC | PRN

## 2021-04-18 MED ORDER — BISACODYL 5 MG PO TBEC: 5.0000 mg | DELAYED_RELEASE_TABLET | Freq: Every day | ORAL | Status: DC | PRN

## 2021-04-18 MED ORDER — DOCUSATE SODIUM 100 MG PO CAPS
100.0000 mg | ORAL_CAPSULE | Freq: Two times a day (BID) | ORAL | Status: DC
  Administered 2021-04-18: 100 mg via ORAL
  Filled 2021-04-18 (×3): qty 1

## 2021-04-18 MED ORDER — SODIUM CHLORIDE 0.9 % IV SOLN: INTRAVENOUS | Status: DC

## 2021-04-18 MED ORDER — AZELASTINE HCL 0.1 % NA SOLN
1.0000 | Freq: Two times a day (BID) | NASAL | Status: DC
  Administered 2021-04-18 – 2021-05-03 (×25): 1 via NASAL
  Filled 2021-04-18: qty 30

## 2021-04-18 NOTE — Consult Note (Signed)
Consultation Note Date: 04/30/2021   Patient Name: [name]  DOB: Mar 30, 1936  MRN: [id number]  Age / Sex: 85 y.o., male  PCP: [name], DO Referring Physician: Karmen Bongo, MD  Reason for Consultation: Establishing goals of care  HPI/Patient Profile: 85 y.o. male with past medical history of HTN; HLD; remote prostate CA; CAD s/p remote CABG; dementia, recurrent falls, and abdominal wall melanoma admitted on 04/17/2021 with sepsis from [facility] - new onset fever, respiratory distress after discharging from from [facility] yesterday.  Patient admitted to [facility] from 10/29-11/4 after presenting with a fall with LOC.  He was found to have an acute SDH. At [facility] ED, ED scan showed mediastinum fluid collection with gas, with a transverse fracture of sternum. PMT has been consulted to assist with goals of care conversation.   Clinical Assessment and Goals of Care:  I have reviewed medical records including EPIC notes, labs and imaging, received report from RN, assessed the patient and then called patient's wife [name] to discuss diagnosis prognosis, Anthony, EOL wishes, disposition and options.  I introduced Palliative Medicine as specialized medical care for people living with serious illness. It focuses on providing relief from the symptoms and stress of a serious illness. The goal is to improve quality of life for both the patient and the family.   We discussed a brief life review of the patient and then focused on their current illness. The natural disease trajectory and expectations at EOL were discussed. [name] and Lillie will be celebrating 83 years of marriage on 11/9. They currently live at home alone with two caregivers that come by to assist, 5 and 3 days per week respectively. They are supported by neighbors and her brother. [name] shares that Keaston was doing well until his fall that led to [facility]  hospitalization. He was ambulating around the house with a walker and exercising. She notes that he has had a declining appetite over the past 6 months and it is difficult for her to encourage him to eat. She did not notice effects of dementia until his SDH, although when reflecting back she notes he made strange comments and on a couple occasions had some hallucinations. We reviewed patient's current illness in great detail, including his high risk for acute decline. Discussed sepsis and MRSA bacteremia, possible endocarditis and pulmonary septic emboli, pneumonia, sternal fracture, new afib, complicated by chronic illnesses and that he is a poor surgical candidate. [name] tells me she is willing to try "all recommendations that will restore all the health that you can." She has had conversations with the patient about his preferences and feels that he would desire this "up to a point." She has a strong faith and is hopeful for improvement at this time, although she understands that her "optimistic nature" may not be realistic. She would like phone calls with updates and honest suggestions, informing her of when it is no longer realistic or beneficial to consider aggressive care.  I attempted to elicit values and goals of care important  to the patient.   [name] shares that patient values his quality of life and would not want to be bedbound. They have not discussed things such as feeding tubes and other treatment options, but she understands that it may be necessary to consider reassessing the care plan if he does not improve with treatments in the coming days.  The difference between aggressive medical intervention and comfort care was considered in light of the patient's goals of care.   Advanced directives, concepts specific to code status, artifical feeding and hydration, and rehospitalization were considered and discussed.  Discussed the importance of continued conversation with family and the medical  providers regarding overall plan of care and treatment options, ensuring decisions are within the context of the patient's values and GOCs.    Questions and concerns were addressed. The family was encouraged to call with questions or concerns.  PMT will continue to support holistically.   NEXT OF KIN is patient's wife [name]. She reports completing Living Will with patient's PCP - none currently on file.    SUMMARY OF RECOMMENDATIONS   -DNR -Patient's wife desires aggressive care for now; she requests open and honest recommendations from the care team based on his improvement or lack thereof  -Introduced topic of comfort care and recommended considering a transition if he does not improve in the coming days -Psychosocial and emotional support provided -Ongoing support and goc discussions  Code Status/Advance Care Planning: DNR  Palliative Prophylaxis:  Aspiration, Delirium Protocol, and Turn Reposition  Additional Recommendations (Limitations, Scope, Preferences): Full Scope Treatment  Psycho-social/Spiritual:  Desire for further Chaplaincy support:TBD Additional Recommendations: Education on Hospice  Prognosis:  Guarded  Discharge Planning: To Be Determined      Primary Diagnoses: Present on Admission:  Sepsis due to methicillin resistant Staphylococcus aureus (MRSA) ([place])  Atrial fibrillation with RVR (HCC)  CAD (coronary artery disease)  Essential hypertension  Hyperlipidemia, mixed  Lumbar compression fracture (HCC)  Sternal wound infection  SDH (subdural hematoma)  Pneumonia involving right lung  Delirium  DNR (do not resuscitate)   I have reviewed the medical record, interviewed the patient and family, and examined the patient. The following aspects are pertinent.  Past Medical History:  Diagnosis Date   Arthritis    Benign essential tremor    Fracture, femur ([place]) 2012   GERD (gastroesophageal reflux disease)    H/O echocardiogram    05/2013   Hx of  colonoscopy    04/22/2013   Hypercholesterolemia    Hypertension    Myocardial infarction [facility])    1987   Polyneuropathy    Prostate cancer ([place]) 2004   Superficial hematoma    Social History   Socioeconomic History   Marital status: Married    Spouse name: Not on file   Number of children: Not on file   Years of education: Not on file   Highest education level: Not on file  Occupational History   Not on file  Tobacco Use   Smoking status: Never   Smokeless tobacco: Never  Vaping Use   Vaping Use: Never used  Substance and Sexual Activity   Alcohol use: Yes    Alcohol/week: 6.0 standard drinks    Types: 6 Glasses of wine per week    Comment: wine in the evening with dinner   Drug use: No   Sexual activity: Not on file  Other Topics Concern   Not on file  Social History Narrative   Not on file   Social Determinants  of Health   Financial Resource Strain: Not on file  Food Insecurity: Not on file  Transportation Needs: Not on file  Physical Activity: Not on file  Stress: Not on file  Social Connections: Not on file   Family History  Problem Relation Age of Onset   Heart disease Mother    Congestive Heart Failure Father    Scheduled Meds:  azelastine  1 spray Each Nare BID   docusate sodium  100 mg Oral BID   sodium chloride flush  3 mL Intravenous Q12H   Continuous Infusions:  sodium chloride 100 mL/hr at 04/25/2021 1434   diltiazem (CARDIZEM) infusion 15 mg/hr (05/02/2021 1305)   lactated ringers Stopped (04/21/2021 1436)   piperacillin-tazobactam (ZOSYN)  IV 3.375 g (04/13/2021 1433)   vancomycin     PRN Meds:.acetaminophen **OR** acetaminophen, bisacodyl, haloperidol lactate, ondansetron **OR** ondansetron (ZOFRAN) IV, polyethylene glycol Medications Prior to Admission:  Prior to Admission medications   Medication Sig Start Date End Date Taking? Authorizing Provider  Alpha Lipoic Acid 200 MG CAPS Take 2 capsules by mouth daily.   Yes [provider]   amLODipine (NORVASC) 2.5 MG tablet TAKE 1 TABLET BY MOUTH  DAILY Patient taking differently: Take 2.5 mg by mouth daily. 01/08/21  Yes Karamalegos, Alexander J, DO  Apoaequorin 10 MG CAPS Take 1 capsule by mouth as directed.   Yes [provider]  Azelastine HCl 137 MCG/SPRAY SOLN PLACE 1 SPRAY INTO BOTH NOSTRILS 2 (TWO) TIMES DAILY. USE IN EACH NOSTRIL AS DIRECTED Patient taking differently: Place 1 spray into the nose in the morning and at bedtime. 11/07/20  Yes Karamalegos, Devonne Doughty, DO  B Complex Vitamins (VITAMIN-B COMPLEX PO) Take 1 tablet by mouth daily.   Yes [provider]  Biotin 1000 MCG tablet Take 1,000 mcg by mouth daily.   Yes [provider]  Calcium Carb-Cholecalciferol 500-100 MG-UNIT CHEW Chew 500 mg by mouth daily.   Yes [provider]  chlorhexidine (HIBICLENS) 4 % external liquid Apply topically daily. Use to wash your body below neck 02/04/21  Yes Tsosie Billing, MD  cyanocobalamin 1000 MCG tablet Take 1,000 mcg by mouth daily.   Yes [provider]  cyclobenzaprine (FLEXERIL) 10 MG tablet Take 0.5-1 tablets (5-10 mg total) by mouth at bedtime as needed for muscle spasms. 03/25/21  Yes Karamalegos, Devonne Doughty, DO  diphenhydrAMINE (BENADRYL) 25 MG tablet Take 25 mg by mouth at bedtime as needed for allergies.   Yes [provider]  ferrous sulfate 325 (65 FE) MG tablet Take 1 tablet by mouth every other day. 04/16/21 04/16/22 Yes [provider]  folic acid (FOLVITE) 1 MG tablet Take 1 mg by mouth daily.   Yes [provider]  furosemide (LASIX) 20 MG tablet TAKE 1 TABLET BY MOUTH  DAILY Patient taking differently: Take 20 mg by mouth daily. 01/08/21  Yes Karamalegos, Devonne Doughty, DO  gabapentin (NEURONTIN) 300 MG capsule Take 300 mg by mouth 3 (three) times daily. 04/15/21  Yes [provider]  Glucosamine Sulfate 1000 MG CAPS Take 1 capsule by mouth daily.   Yes [provider]   HYDROcodone-acetaminophen (NORCO/VICODIN) 5-325 MG tablet Take 1 tablet by mouth every 6 (six) hours as needed for moderate pain.   Yes [provider]  Lecithin 400 MG CAPS Take 400 mg by mouth daily.   Yes [provider]  LECITHIN-19 PO Take 4 tablets by mouth daily.   Yes [provider]  losartan (COZAAR) 50  MG tablet Take 50 mg by mouth daily. 02/09/21  Yes [provider]  melatonin 5 MG TABS Take 5 mg by mouth at bedtime.   Yes [provider]  Omega 3 1000 MG CAPS Take 1,000 mg by mouth daily.   Yes [provider]  omeprazole (PRILOSEC) 20 MG capsule TAKE 1 CAPSULE BY MOUTH  TWICE DAILY BEFORE MEALS 01/08/21  Yes Karamalegos, Devonne Doughty, DO  potassium chloride SA (KLOR-CON) 20 MEQ tablet Take 1 tablet (20 mEq total) by mouth 2 (two) times daily. 09/30/20  Yes Karamalegos, Devonne Doughty, DO  pravastatin (PRAVACHOL) 80 MG tablet TAKE 1 TABLET BY MOUTH  DAILY Patient taking differently: Take 80 mg by mouth daily. 01/08/21  Yes Karamalegos, Devonne Doughty, DO  primidone (MYSOLINE) 50 MG tablet Take 1-2 tablets (50-100 mg total) by mouth See admin instructions. Take 2 tablets (100mg ) by mouth every morning and take 1 tablet (50mg ) by mouth every night Patient taking differently: Take 50 mg by mouth 3 (three) times daily. 01/08/21  Yes Karamalegos, Devonne Doughty, DO  promethazine (PHENERGAN) 12.5 MG tablet Take 1-2 tablets (12.5-25 mg total) by mouth every 8 (eight) hours as needed for nausea or vomiting. Caution sedation. 01/28/21  Yes Karamalegos, Devonne Doughty, DO  Selenium 200 MCG CAPS Take 200 mcg by mouth daily.   Yes [provider]  traMADol (ULTRAM) 50 MG tablet Take 1-2 tablets (50-100 mg total) by mouth every 8 (eight) hours as needed. 03/25/21  Yes Karamalegos, Devonne Doughty, DO  tretinoin (RETIN-A) 0.05 % cream Apply 1 application topically at bedtime.   Yes [provider]  triamcinolone (KENALOG) 0.1 % Apply 1 application  topically 2 (two) times daily. (apply to hands, legs and feet) 07/27/20  Yes [provider]  aspirin 81 MG tablet Take 162 mg by mouth at bedtime. Patient not taking: No sig reported    [provider]   Allergies  Allergen Reactions   Codeine Nausea And Vomiting   Indocin [Indomethacin] Other (See Comments)   Latex Other (See Comments)    Blisters   Mupirocin     Blisters   Plavix [Clopidogrel Bisulfate]     GI intolerance, n/v   Polysporin [Bacitracin-Polymyxin B]     Blisters   Shellfish Allergy Nausea And Vomiting    Mussels   Tape    Neosporin [Neomycin-Bacitracin Zn-Polymyx] Rash   Other Nausea And Vomiting    mussels   Review of Systems  Unable to perform ROS: Acuity of condition   Physical Exam Vitals and nursing note reviewed.  Constitutional:      General: He is not in acute distress.    Appearance: He is ill-appearing.  Cardiovascular:     Rate and Rhythm: Tachycardia present. Rhythm irregularly irregular.  Pulmonary:     Effort: Pulmonary effort is normal.  Neurological:     Mental Status: He is unresponsive.    Vital Signs: BP 122/66 (BP Location: Left Arm)   Pulse (!) 108   Temp 98.4 F (36.9 C) (Axillary)   Resp 20   SpO2 96%  Pain Scale: Faces   Pain Score: 0-No pain   SpO2: SpO2: 96 % O2 Device:SpO2: 96 % O2 Flow Rate: .O2 Flow Rate (L/min): 0 L/min  IO: Intake/output summary:  Intake/Output Summary (Last 24 hours) at 05/04/2021 1554 Last data filed at 04/28/2021 1302 Gross per 24 hour  Intake --  Output 700 ml  Net -700 ml    LBM:   Baseline Weight:  Most recent weight:       Palliative Assessment/Data:     Time In: 3:30pm Time Out: 4:45pm Time Total: 75 minutes Greater than 50% of this time was spent in counseling and coordinating care related to the above assessment and plan.  Dorthy Cooler, PA-C Palliative Medicine Team Team phone # [phone]  Thank you for allowing the Palliative Medicine Team  to assist in the care of this patient. Please utilize secure chat with additional questions, if there is no response within 30 minutes please call the above phone number.  Palliative Medicine Team providers are available by phone from 7am to 7pm daily and can be reached through the team cell phone.  Should this patient require assistance outside of these hours, please call the patient's attending physician.

## 2021-04-18 NOTE — Consult Note (Signed)
Reason for Consult: Subdural hematoma Referring Physician: Dr. Osker Mason [name] is an 85 y.o. male.  HPI: 85 year old male with multiple significant medical problems.  Patient recently admitted after a fall to an outside hospital where he was discovered to have a small acute left convexity subdural hematoma.  Nonoperative/observational care was recommended.  The patient returned home where he continued to do poorly.  Patient has had additional falls.  He now presents with altered mental status and sepsis.  The patient's mental status precludes any significant history from the patient.  Past Medical History:  Diagnosis Date   Arthritis    Benign essential tremor    Fracture, femur ([place]) 2012   GERD (gastroesophageal reflux disease)    H/O echocardiogram    05/2013   Hx of colonoscopy    04/22/2013   Hypercholesterolemia    Hypertension    Myocardial infarction [facility])    1987   Polyneuropathy    Prostate cancer ([place]) 2004   Superficial hematoma     Past Surgical History:  Procedure Laterality Date   APPLICATION OF WOUND VAC Left 08/12/2020   Procedure: APPLICATION OF WOUND VAC;  Surgeon: Samara Deist, DPM;  Location: [facility] ORS;  Service: Podiatry;  Laterality: Left;   BASAL CELL CARCINOMA EXCISION     COLONOSCOPY  05/01/2009   Positive for colonic polyps   COLONOSCOPY WITH PROPOFOL N/A 02/24/2016   Procedure: COLONOSCOPY WITH PROPOFOL;  Surgeon: Christene Lye, MD;  Location: [facility] ENDOSCOPY;  Service: Endoscopy;  Laterality: N/A;   CORONARY ARTERY BYPASS GRAFT  1987   X2   HERNIA REPAIR  1990   KYPHOPLASTY N/A 02/19/2021   Procedure: KYPHOPLASTY-L2&L3;  Surgeon: Hessie Knows, MD;  Location: [facility] ORS;  Service: Orthopedics;  Laterality: N/A;   PROSTATE SURGERY  2004   TEE WITHOUT CARDIOVERSION N/A 08/13/2020   Procedure: TRANSESOPHAGEAL ECHOCARDIOGRAM (TEE);  Surgeon: Corey Skains, MD;  Location: [facility] ORS;  Service: Cardiovascular;  Laterality: N/A;   WOUND  DEBRIDEMENT Left 08/12/2020   Procedure: DEBRIDEMENT WOUND-Left Heel;  Surgeon: Samara Deist, DPM;  Location: [facility] ORS;  Service: Podiatry;  Laterality: Left;    Family History  Problem Relation Age of Onset   Heart disease Mother    Congestive Heart Failure Father     Social History:  reports that he has never smoked. He has never used smokeless tobacco. He reports current alcohol use of about 6.0 standard drinks per week. He reports that he does not use drugs.  Allergies:  Allergies  Allergen Reactions   Codeine Nausea And Vomiting   Indocin [Indomethacin] Other (See Comments)   Latex Other (See Comments)    Blisters   Mupirocin     Blisters   Plavix [Clopidogrel Bisulfate]     GI intolerance, n/v   Polysporin [Bacitracin-Polymyxin B]     Blisters   Shellfish Allergy Nausea And Vomiting    Mussels   Tape    Neosporin [Neomycin-Bacitracin Zn-Polymyx] Rash   Other Nausea And Vomiting    mussels    Medications: I have reviewed the patient's current medications.  Results for orders placed or performed during the hospital encounter of 04/17/2021 (from the past 48 hour(s))  Comprehensive metabolic panel     Status: Abnormal   Collection Time: 04/17/2021  5:39 AM  Result Value Ref Range   Sodium 132 (L) 135 - 145 mmol/L   Potassium 3.8 3.5 - 5.1 mmol/L   Chloride 100 98 - 111 mmol/L   CO2 19 (  L) 22 - 32 mmol/L   Glucose, Bld 131 (H) 70 - 99 mg/dL    Comment: Glucose reference range applies only to samples taken after fasting for at least 8 hours.   BUN 23 8 - 23 mg/dL   Creatinine, Ser 0.92 0.61 - 1.24 mg/dL   Calcium 8.9 8.9 - 10.3 mg/dL   Total Protein 6.0 (L) 6.5 - 8.1 g/dL   Albumin 2.0 (L) 3.5 - 5.0 g/dL   AST 43 (H) 15 - 41 U/L   ALT 24 0 - 44 U/L   Alkaline Phosphatase 54 38 - 126 U/L   Total Bilirubin 1.3 (H) 0.3 - 1.2 mg/dL   GFR, Estimated >60 >60 mL/min    Comment: (NOTE) Calculated using the CKD-EPI Creatinine Equation (2021)    Anion gap 13 5 - 15     Comment: Performed at [facility], [place] [address] [place], [place] [address]  CBC with Differential/Platelet     Status: Abnormal   Collection Time: 05/05/2021  5:39 AM  Result Value Ref Range   WBC 11.6 (H) 4.0 - 10.5 K/uL   RBC 2.56 (L) 4.22 - 5.81 MIL/uL   Hemoglobin 8.9 (L) 13.0 - 17.0 g/dL   HCT 27.3 (L) 39.0 - 52.0 %   MCV 106.6 (H) 80.0 - 100.0 fL   MCH 34.8 (H) 26.0 - 34.0 pg   MCHC 32.6 30.0 - 36.0 g/dL   RDW 14.0 11.5 - 15.5 %   Platelets 296 150 - 400 K/uL   nRBC 0.0 0.0 - 0.2 %   Neutrophils Relative % 88 %   Neutro Abs 10.2 (H) 1.7 - 7.7 K/uL   Lymphocytes Relative 7 %   Lymphs Abs 0.8 0.7 - 4.0 K/uL   Monocytes Relative 5 %   Monocytes Absolute 0.6 0.1 - 1.0 K/uL   Eosinophils Relative 0 %   Eosinophils Absolute 0.0 0.0 - 0.5 K/uL   Basophils Relative 0 %   Basophils Absolute 0.0 0.0 - 0.1 K/uL   nRBC 0 0 /100 WBC   Abs Immature Granulocytes 0.00 0.00 - 0.07 K/uL   Burr Cells PRESENT    Polychromasia PRESENT     Comment: Performed at [facility], [place] [address] [place], [place] [address]  Type and screen [place]     Status: None   Collection Time: 05/08/2021  5:39 AM  Result Value Ref Range   ABO/RH(D) AB POS    Antibody Screen NEG    Sample Expiration      04/21/2021,2359 Performed at [facility], [place] [address] [place], [place] [address]   Troponin I (High Sensitivity)     Status: Abnormal   Collection Time: 04/28/2021  5:39 AM  Result Value Ref Range   Troponin I (High Sensitivity) 24 (H) <18 ng/L    Comment: (NOTE) Elevated high sensitivity troponin I (hsTnI) values and significant  changes across serial measurements may suggest ACS but many other  chronic and acute conditions are known to elevate hsTnI results.  Refer to the "Links" section for chest pain algorithms and additional  guidance. Performed at [facility], [place] [address] [place], [place] [address]   Lactic acid, plasma     Status: None    Collection Time: 04/21/2021  5:39 AM  Result Value Ref Range   Lactic Acid, Venous 1.6 0.5 - 1.9 mmol/L    Comment: Performed at [place] [address],  [place], [place] [address]  ABO/Rh     Status: None   Collection Time: 05/03/2021  6:37 AM  Result Value Ref Range   ABO/RH(D)      AB POS Performed at [facility] [address] [place], [place] [address]   Troponin I (High Sensitivity)     Status: Abnormal   Collection Time: 04/17/2021  7:37 AM  Result Value Ref Range   Troponin I (High Sensitivity) 24 (H) <18 ng/L    Comment: (NOTE) Elevated high sensitivity troponin I (hsTnI) values and significant  changes across serial measurements may suggest ACS but many other  chronic and acute conditions are known to elevate hsTnI results.  Refer to the "Links" section for chest pain algorithms and additional  guidance. Performed at [facility], [place] [address], [place], [place] [address]   Procalcitonin     Status: None   Collection Time: 04/28/2021  7:37 AM  Result Value Ref Range   Procalcitonin 3.69 ng/mL    Comment:        Interpretation: PCT > 2 ng/mL: Systemic infection (sepsis) is likely, unless other causes are known. (NOTE)       Sepsis PCT Algorithm           Lower Respiratory Tract                                      Infection PCT Algorithm    ----------------------------     ----------------------------         PCT < 0.25 ng/mL                PCT < 0.10 ng/mL          Strongly encourage             Strongly discourage   discontinuation of antibiotics    initiation of antibiotics    ----------------------------     -----------------------------       PCT 0.25 - 0.50 ng/mL            PCT 0.10 - 0.25 ng/mL               OR       >80% decrease in PCT            Discourage initiation of                                            antibiotics      Encourage discontinuation           of antibiotics    ----------------------------      -----------------------------         PCT >= 0.50 ng/mL              PCT 0.26 - 0.50 ng/mL               AND       <80% decrease in PCT              Encourage initiation of  antibiotics       Encourage continuation           of antibiotics    ----------------------------     -----------------------------        PCT >= 0.50 ng/mL                  PCT > 0.50 ng/mL               AND         increase in PCT                  Strongly encourage                                      initiation of antibiotics    Strongly encourage escalation           of antibiotics                                     -----------------------------                                           PCT <= 0.25 ng/mL                                                 OR                                        > 80% decrease in PCT                                      Discontinue / Do not initiate                                             antibiotics  Performed at [facility], [address], [place], [place] [address]     CT HEAD WO CONTRAST (5MM)  Result Date: 04/17/2021 CLINICAL DATA:  Mental status change, unknown cause. Sepsis. Recent fall. EXAM: CT HEAD WITHOUT CONTRAST TECHNIQUE: Contiguous axial images were obtained from the base of the skull through the vertex without intravenous contrast. COMPARISON:  Report of 04/10/2021 head CT from [facility] FINDINGS: Brain: A small mixed density subdural hematoma over the left cerebral convexity measures up to 5 mm in thickness (4 mm thickness reported on the recent outside CT). A small subdural fluid collection over the right cerebral convexity measures up to 1.3 cm in thickness in the frontal region and is diffusely hypoattenuating with density similar to only slightly greater than that of CSF; this was not described in the recent outside report. There is associated right frontal gyral flattening without midline shift or other  significant mass effect. Hypodensities in the cerebral white matter bilaterally are nonspecific but compatible with  mild-to-moderate chronic small vessel ischemic disease. There is mild cerebral atrophy. No acute infarct, parenchymal hemorrhage, subarachnoid hemorrhage, or mass is identified. Vascular: Calcified atherosclerosis at the skull base. No hyperdense vessel. Skull: No fracture or suspicious osseous lesion. Sinuses/Orbits: Mild mucosal thickening or small volume fluid in the left sphenoid sinus. Visualized mastoid air cells are clear. Unremarkable orbits. Other: Mild left occipital scalp swelling with skin staples in place. IMPRESSION: 1. Small mixed density subdural hematoma over the left cerebral convexity, similar in size to that described on the recent outside CT. 2. Small low-density subdural collection in the right frontal region suggestive of a subdural hygroma and potentially new. No midline shift. 3. Mild-to-moderate chronic small vessel ischemic disease. Electronically Signed   By: Logan Bores M.D.   On: 04/17/2021 11:38   CT Angio Chest PE W and/or Wo Contrast  Result Date: 04/17/2021 CLINICAL DATA:  PE suspected. Sepsis. EXAM: CT ANGIOGRAPHY CHEST CT ABDOMEN AND PELVIS WITH CONTRAST TECHNIQUE: Multidetector CT imaging of the chest was performed using the standard protocol during bolus administration of intravenous contrast. Multiplanar CT image reconstructions and MIPs were obtained to evaluate the vascular anatomy. Multidetector CT imaging of the abdomen and pelvis was performed using the standard protocol during bolus administration of intravenous contrast. CONTRAST:  155mL OMNIPAQUE IOHEXOL 350 MG/ML SOLN COMPARISON:  None. FINDINGS: CTA CHEST FINDINGS Cardiovascular: Suboptimal visualization of the segmental pulmonary arteries in the lower thorax due to streak artifact. Accounting for this, no evidence of pulmonary embolus. Prior CABG. Normal heart size. Minimal pericardial effusion.  Mediastinum/Nodes: No enlarged mediastinal, hilar, or axillary lymph nodes. Thyroid gland is normal. There is polypoid soft tissue density opacification along the right hand side of the proximal trachea. The main bronchi are patent. There is a high density gas and fluid collection in the anterior mediastinum, posterior to the sternum measuring 5.6 x 3.1 cm. Curvilinear right opacification within this collection suggests possible active extravasation. There is an associated gas and fluid collection anterior to the sternum, at the level of the third median sternotomy wire. There is a transverse fracture of the body of the sternum, likely acute and causing the above-mentioned collections. Lungs/Pleura: There are small bilateral pleural effusions with bibasilar atelectasis. Peripheral nodular opacities are seen in the left upper lobe and right upper lobe with prominent cavitation within the left upper lobe opacity. The findings are suggestive of septic emboli. Small nodular opacities are seen in the left lung apex. Musculoskeletal: Transverse fracture of the manubrium. Age indeterminate compression fracture of T7 vertebral body with approximately 30% height loss. Review of the MIP images confirms the above findings. CT ABDOMEN and PELVIS FINDINGS Hepatobiliary: No focal liver abnormality is seen. No gallstones, gallbladder wall thickening, or biliary dilatation. Pancreas: Unremarkable. No pancreatic ductal dilatation or surrounding inflammatory changes. Spleen: Normal in size without focal abnormality. Adrenals/Urinary Tract: Adrenal glands are unremarkable. Kidneys are normal, without renal calculi, focal lesion, or hydronephrosis. Bladder is decompressed around urinary Foley. Diffuse irregular urinary bladder wall thickening. Stomach/Bowel: Moderate hiatal hernia. No evidence of small-bowel obstruction. Large amount of formed stool throughout the colon. Vascular/Lymphatic: Aortic atherosclerosis. No grossly enlarged  abdominal or pelvic lymph nodes. Shotty retroperitoneal lymph nodes. Reproductive: Prior prostatectomy. Other: Fat containing periumbilical anterior abdominal wall hernia. Musculoskeletal: Age indeterminate severe compression fracture of L1 vertebral body with approximately 80% height loss, and 9 mm retropulsion. Age-indeterminate transverse fracture of T12 vertebral body, with approximately 10% height loss. 2-3 mm of retropulsion to the spinal canal. Soft tissue and  small amount of gas are seen surrounding the fracture line. The effect of the soft tissue on the spinal cord cannot be well evaluated. Review of the MIP images confirms the above findings. IMPRESSION: 1. Suboptimal visualization of the segmental pulmonary arteries in the lower thorax due to streak artifact. Accounting for this, no evidence of pulmonary embolus. 2. 5.6 x 3.1 cm high density gas and fluid collection in the anterior mediastinum, posterior to the sternum. Curvilinear bright opacification within this collection suggests possible active extravasation. 3. Transverse fracture of the body of the sternum, likely acute and causing the above-mentioned collections. 4 mm posterior displacement of the superior fracture line. 4. Small bilateral pleural effusions with bibasilar atelectasis. 5. Peripheral nodular opacities in the left upper lobe and right upper lobe with prominent cavitation within the left upper lobe opacity. The findings are suggestive of septic emboli. 6. Polypoid soft tissue density opacification along the hand side of the proximal trachea. This may represent adherent secretions, however, endobronchial lesion cannot be excluded. 7. Age indeterminate compression fractures of T7, L1 and T12 vertebral bodies. 9 mm retropulsion at the L1 fracture line. Indistinct fracture line and soft tissue surrounding the T12 vertebral body fracture raises the question about possible pathologic fracture with questionable osteomyelitis. The effect on  the spinal cord from these fractures cannot be well evaluated. If there is clinical concern for neurologic symptoms, consider evaluation with contrast enhanced MRI. 8. Large amount of formed stool throughout the colon, query constipation. 9. Diffuse irregular thickening of the urinary bladder wall, not well visualized due to its decompressed state. 10. Fat containing periumbilical anterior abdominal wall hernia. 11. Prior prostatectomy. 12. Aortic atherosclerosis. Aortic Atherosclerosis (ICD10-I70.0). These results were called by telephone at the time of interpretation on 04/17/2021 at 1:00 pm to provider [facility] , who verbally acknowledged these results. Electronically Signed   By: Fidela Salisbury M.D.   On: 04/17/2021 13:14   CT ABDOMEN PELVIS W CONTRAST  Result Date: 04/17/2021 CLINICAL DATA:  PE suspected. Sepsis. EXAM: CT ANGIOGRAPHY CHEST CT ABDOMEN AND PELVIS WITH CONTRAST TECHNIQUE: Multidetector CT imaging of the chest was performed using the standard protocol during bolus administration of intravenous contrast. Multiplanar CT image reconstructions and MIPs were obtained to evaluate the vascular anatomy. Multidetector CT imaging of the abdomen and pelvis was performed using the standard protocol during bolus administration of intravenous contrast. CONTRAST:  135mL OMNIPAQUE IOHEXOL 350 MG/ML SOLN COMPARISON:  None. FINDINGS: CTA CHEST FINDINGS Cardiovascular: Suboptimal visualization of the segmental pulmonary arteries in the lower thorax due to streak artifact. Accounting for this, no evidence of pulmonary embolus. Prior CABG. Normal heart size. Minimal pericardial effusion. Mediastinum/Nodes: No enlarged mediastinal, hilar, or axillary lymph nodes. Thyroid gland is normal. There is polypoid soft tissue density opacification along the right hand side of the proximal trachea. The main bronchi are patent. There is a high density gas and fluid collection in the anterior mediastinum, posterior to  the sternum measuring 5.6 x 3.1 cm. Curvilinear right opacification within this collection suggests possible active extravasation. There is an associated gas and fluid collection anterior to the sternum, at the level of the third median sternotomy wire. There is a transverse fracture of the body of the sternum, likely acute and causing the above-mentioned collections. Lungs/Pleura: There are small bilateral pleural effusions with bibasilar atelectasis. Peripheral nodular opacities are seen in the left upper lobe and right upper lobe with prominent cavitation within the left upper lobe opacity. The findings are suggestive of septic  emboli. Small nodular opacities are seen in the left lung apex. Musculoskeletal: Transverse fracture of the manubrium. Age indeterminate compression fracture of T7 vertebral body with approximately 30% height loss. Review of the MIP images confirms the above findings. CT ABDOMEN and PELVIS FINDINGS Hepatobiliary: No focal liver abnormality is seen. No gallstones, gallbladder wall thickening, or biliary dilatation. Pancreas: Unremarkable. No pancreatic ductal dilatation or surrounding inflammatory changes. Spleen: Normal in size without focal abnormality. Adrenals/Urinary Tract: Adrenal glands are unremarkable. Kidneys are normal, without renal calculi, focal lesion, or hydronephrosis. Bladder is decompressed around urinary Foley. Diffuse irregular urinary bladder wall thickening. Stomach/Bowel: Moderate hiatal hernia. No evidence of small-bowel obstruction. Large amount of formed stool throughout the colon. Vascular/Lymphatic: Aortic atherosclerosis. No grossly enlarged abdominal or pelvic lymph nodes. Shotty retroperitoneal lymph nodes. Reproductive: Prior prostatectomy. Other: Fat containing periumbilical anterior abdominal wall hernia. Musculoskeletal: Age indeterminate severe compression fracture of L1 vertebral body with approximately 80% height loss, and 9 mm retropulsion.  Age-indeterminate transverse fracture of T12 vertebral body, with approximately 10% height loss. 2-3 mm of retropulsion to the spinal canal. Soft tissue and small amount of gas are seen surrounding the fracture line. The effect of the soft tissue on the spinal cord cannot be well evaluated. Review of the MIP images confirms the above findings. IMPRESSION: 1. Suboptimal visualization of the segmental pulmonary arteries in the lower thorax due to streak artifact. Accounting for this, no evidence of pulmonary embolus. 2. 5.6 x 3.1 cm high density gas and fluid collection in the anterior mediastinum, posterior to the sternum. Curvilinear bright opacification within this collection suggests possible active extravasation. 3. Transverse fracture of the body of the sternum, likely acute and causing the above-mentioned collections. 4 mm posterior displacement of the superior fracture line. 4. Small bilateral pleural effusions with bibasilar atelectasis. 5. Peripheral nodular opacities in the left upper lobe and right upper lobe with prominent cavitation within the left upper lobe opacity. The findings are suggestive of septic emboli. 6. Polypoid soft tissue density opacification along the hand side of the proximal trachea. This may represent adherent secretions, however, endobronchial lesion cannot be excluded. 7. Age indeterminate compression fractures of T7, L1 and T12 vertebral bodies. 9 mm retropulsion at the L1 fracture line. Indistinct fracture line and soft tissue surrounding the T12 vertebral body fracture raises the question about possible pathologic fracture with questionable osteomyelitis. The effect on the spinal cord from these fractures cannot be well evaluated. If there is clinical concern for neurologic symptoms, consider evaluation with contrast enhanced MRI. 8. Large amount of formed stool throughout the colon, query constipation. 9. Diffuse irregular thickening of the urinary bladder wall, not well  visualized due to its decompressed state. 10. Fat containing periumbilical anterior abdominal wall hernia. 11. Prior prostatectomy. 12. Aortic atherosclerosis. Aortic Atherosclerosis (ICD10-I70.0). These results were called by telephone at the time of interpretation on 04/17/2021 at 1:00 pm to provider [facility] , who verbally acknowledged these results. Electronically Signed   By: Fidela Salisbury M.D.   On: 04/17/2021 13:14   US Venous Img Upper Uni Left (DVT)  Result Date: 04/17/2021 CLINICAL DATA:  Left upper extremity swelling EXAM: LEFT UPPER EXTREMITY VENOUS DOPPLER ULTRASOUND TECHNIQUE: Gray-scale sonography with graded compression, as well as color Doppler and duplex ultrasound were performed to evaluate the upper extremity deep venous system from the level of the subclavian vein and including the jugular, axillary, basilic, radial, ulnar and upper cephalic vein. Spectral Doppler was utilized to evaluate flow at rest and with  distal augmentation maneuvers. COMPARISON:  None. FINDINGS: Contralateral Subclavian Vein: Respiratory phasicity is normal and symmetric with the symptomatic side. No evidence of thrombus. Normal compressibility. Internal Jugular Vein: No evidence of thrombus. Normal compressibility, respiratory phasicity and response to augmentation. Subclavian Vein: No evidence of thrombus. Normal compressibility, respiratory phasicity and response to augmentation. Axillary Vein: No evidence of thrombus. Normal compressibility, respiratory phasicity and response to augmentation. Cephalic Vein: Acute expansile near occlusive superficial thrombophlebitis of the distal left cephalic vein in the region of the antecubital fossa at a former IV site. Proximal and mid left cephalic vein are patent and compressible. Basilic Vein: No evidence of thrombus. Normal compressibility, respiratory phasicity and response to augmentation. Brachial Veins: No evidence of thrombus. Normal compressibility,  respiratory phasicity and response to augmentation. Radial Veins: No evidence of thrombus. Normal compressibility, respiratory phasicity and response to augmentation. Ulnar Veins: No evidence of thrombus. Normal compressibility, respiratory phasicity and response to augmentation. Venous Reflux:  None visualized. Other Findings:  None visualized. IMPRESSION: No evidence of deep venous thrombosis within the left upper extremity. Acute near occlusive superficial thrombophlebitis of the distal left cephalic vein in the region of the antecubital fossa. Electronically Signed   By: Ilona Sorrel M.D.   On: 04/17/2021 11:20   DG Chest Portable 1 View  Result Date: 04/17/2021 CLINICAL DATA:  Status post central line placement EXAM: PORTABLE CHEST 1 VIEW COMPARISON:  Film from earlier in the same day. FINDINGS: New left jugular central line is noted with the catheter tip at the cavoatrial junction. No pneumothorax is noted. Cardiac shadow is stable. Postsurgical changes are again noted. Patchy airspace disease is again noted in the right mid lung. No new focal abnormality is noted. IMPRESSION: No pneumothorax following central line placement on the left. Electronically Signed   By: Inez Catalina M.D.   On: 04/17/2021 21:53   DG Chest Portable 1 View  Result Date: 04/17/2021 CLINICAL DATA:  Sepsis.  Hypoxia. EXAM: PORTABLE CHEST 1 VIEW COMPARISON:  08/17/2020 FINDINGS: Stable mild cardiomegaly. Prior CABG again noted. New ill-defined airspace opacity is seen in the right midlung. Left lung is clear. No evidence of pleural effusion. IMPRESSION: New ill-defined airspace opacity in right midlung. Electronically Signed   By: Marlaine Hind M.D.   On: 04/17/2021 11:47    Review of systems not obtained due to patient factors. Blood pressure 122/66, pulse (!) 108, temperature 98.4 F (36.9 C), temperature source Axillary, resp. rate 20, SpO2 96 %. Patient is awake and appears aware but he is profoundly confused speaking  gibberish.  His pupils are equal and reactive bilaterally.  His gaze is conjugate.  His facial movement is symmetric bilaterally.  Patient moves his upper and lower extremities spontaneously but not to command.  Assessment/Plan: #1 patient has a left convexity subacute subdural hematoma with no significant mass-effect.  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  There is significant cortical atrophy.  There is evidence of acute infarct or hydrocephalus.  At this point I would not recommend any type of surgical intervention for his left-sided subacute subdural hematoma.  Anticoagulation should be avoided.  Think patient's delirium is more related to his sepsis then the subdural hematoma.  2.  The patient has a chronic, healed compression fracture at L1.  He has subacute compression fractures of L2 and L3.  He has no new mild compression fracture of T12 with some gas vertebra.  I cannot rule  out osteomyelitis but I do not see any soft tissue swelling around the spine do worry about definite infection in this further.  At this point I would recommend continuing his current antibiotic management.  I do not feel any surgical intervention with regard to his spine is necessary.  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   [name] 04/23/2021, 11:42 AM

## 2021-04-18 NOTE — H&P (Signed)
History and Physical    [name] HYW:[id number] DOB: Jan 05, 1936 DOA: 05/01/2021  PCP: [name], DO  Patient coming from:  Home - lives with wife; NOK: Wife, Marsean Elkhatib, [phone]  Chief Complaint: AMS  HPI: [name] is a 85 y.o. male with medical history significant of HTN; HLD; remote prostate CA; CAD s/p remote CABG; and abdominal wall melanoma presenting to [facility] with sepsis.  He is quite delirious this AM and is unable to provide history.  He was seen at [facility] on 10/23 for melanoma on his R upper abdomen; pathology showed melanoma in situ arising in a severely dysplastic nevus.  He underwent a wide local excision with 0.5 cm margins in clinic on that day.  He was subsequently admitted to [facility] from 10/29-11/4 after presenting with a fall with LOC.  He was found to have an acute SDH.  He is not on [facility] and the SDH was small and so neurosurgery did not recommend intervention.  He did have staples placed in a scalp wound.  Regarding his frequent falls and worsening chronic debility, this was thought to be multifactorial and related to peripheral neuropathy, tremor, cognitive impairment, medications, and dehydration with orthostasis.  He was recommended for SNF placement and patient and spouse declined, opting for [facility] and hired 24/7 care.  He had urinary retention with indwelling foley recommended for 1-2 weeks.  He had sinus tachycardia which resolved, but no report of afib.  He did exhibit delirium which was thought to be related to head trauma in conjunction with underlying mild cognitive impairment.      ED Course: [facility] to [facility] transfer, per Dr. Roosevelt Locks:  Patient was recently hospitalized at [facility] for frequent falls, was found to have acute on chronic subdural hematoma, trauma scan CT chest/abdomen/pelvis on 10/29 did not show significant etiology.  Patient was discharged home yesterday.   Today patient came in with new onset of fever and respiratory distress was found to  have respiratory failure now stable on 3 L, fever 100.9, and rapid A. fib heart rate 130s-140s.   ED rescan patient's chest and found no PE but a large 5.6 x3.1 cm anterior mediastinum fluid collection with gas, with a transverse fracture of sternum which causing the above-mentioned collection, although there is no signs of active extravasation. There are also nodules on both right and left upper lobes indicating septic emboli.  (I had a discussion with the ED physician and both of [place] believe given the finding of gas in it, infection/abscess can not be ruled out and now patient is on Vanco and Zosyn). I also asked the ED to consult CT surgery at [facility] at once to let them know patient is on his way here.   For a-fib, patient is on Cardizem drip.   Very sick patient, Walker talked ICU at [facility], who recommend patient admit to Step down. Inform CT surgery ASAP when patient arrives.  Review of Systems: unable to perform    COVID Vaccine Status:  Complete  Past Medical History:  Diagnosis Date   Arthritis    Benign essential tremor    Fracture, femur ([place]) 2012   GERD (gastroesophageal reflux disease)    H/O echocardiogram    05/2013   Hx of colonoscopy    04/22/2013   Hypercholesterolemia    Hypertension    Myocardial infarction [facility])    1987   Polyneuropathy    Prostate cancer ([place]) 2004   Superficial hematoma  Past Surgical History:  Procedure Laterality Date   APPLICATION OF WOUND VAC Left 08/12/2020   Procedure: APPLICATION OF WOUND VAC;  Surgeon: Samara Deist, DPM;  Location: [facility] ORS;  Service: Podiatry;  Laterality: Left;   BASAL CELL CARCINOMA EXCISION     COLONOSCOPY  05/01/2009   Positive for colonic polyps   COLONOSCOPY WITH PROPOFOL N/A 02/24/2016   Procedure: COLONOSCOPY WITH PROPOFOL;  Surgeon: Christene Lye, MD;  Location: [facility] ENDOSCOPY;  Service: Endoscopy;  Laterality: N/A;   CORONARY ARTERY BYPASS GRAFT  1987   X2   HERNIA REPAIR  1990   KYPHOPLASTY N/A  02/19/2021   Procedure: KYPHOPLASTY-L2&L3;  Surgeon: Hessie Knows, MD;  Location: [facility] ORS;  Service: Orthopedics;  Laterality: N/A;   PROSTATE SURGERY  2004   TEE WITHOUT CARDIOVERSION N/A 08/13/2020   Procedure: TRANSESOPHAGEAL ECHOCARDIOGRAM (TEE);  Surgeon: Corey Skains, MD;  Location: [facility] ORS;  Service: Cardiovascular;  Laterality: N/A;   WOUND DEBRIDEMENT Left 08/12/2020   Procedure: DEBRIDEMENT WOUND-Left Heel;  Surgeon: Samara Deist, DPM;  Location: [facility] ORS;  Service: Podiatry;  Laterality: Left;    Social History   Socioeconomic History   Marital status: Married    Spouse name: Not on file   Number of children: Not on file   Years of education: Not on file   Highest education level: Not on file  Occupational History   Not on file  Tobacco Use   Smoking status: Never   Smokeless tobacco: Never  Vaping Use   Vaping Use: Never used  Substance and Sexual Activity   Alcohol use: Yes    Alcohol/week: 6.0 standard drinks    Types: 6 Glasses of wine per week    Comment: wine in the evening with dinner   Drug use: No   Sexual activity: Not on file  Other Topics Concern   Not on file  Social History Narrative   Not on file   Social Determinants of Health   Financial Resource Strain: Not on file  Food Insecurity: Not on file  Transportation Needs: Not on file  Physical Activity: Not on file  Stress: Not on file  Social Connections: Not on file  Intimate Partner Violence: Not on file    Allergies  Allergen Reactions   Codeine Nausea And Vomiting   Indocin [Indomethacin] Other (See Comments)   Latex Other (See Comments)    Blisters   Mupirocin     Blisters   Plavix [Clopidogrel Bisulfate]     GI intolerance, n/v   Polysporin [Bacitracin-Polymyxin B]     Blisters   Shellfish Allergy Nausea And Vomiting    Mussels   Tape    Neosporin [Neomycin-Bacitracin Zn-Polymyx] Rash   Other Nausea And Vomiting    mussels    Family History  Problem Relation Age of  Onset   Heart disease Mother    Congestive Heart Failure Father     Prior to Admission medications   Medication Sig Start Date End Date Taking? Authorizing Provider  Alpha Lipoic Acid 200 MG CAPS Take 2 capsules by mouth daily.    [provider]  amLODipine (NORVASC) 2.5 MG tablet TAKE 1 TABLET BY MOUTH  DAILY 01/08/21   Karamalegos, Devonne Doughty, DO  Apoaequorin 10 MG CAPS Take 1 capsule by mouth as directed.    [provider]  aspirin 81 MG tablet Take 162 mg by mouth at bedtime.    [provider]  Azelastine HCl 137 MCG/SPRAY SOLN PLACE 1 SPRAY  INTO BOTH NOSTRILS 2 (TWO) TIMES DAILY. USE IN EACH NOSTRIL AS DIRECTED 11/07/20   Parks Ranger, Devonne Doughty, DO  B Complex Vitamins (VITAMIN-B COMPLEX PO) Take 1 tablet by mouth daily.    [provider]  Biotin 1000 MCG tablet Take 1,000 mcg by mouth daily.    [provider]  Calcium Carb-Cholecalciferol 500-100 MG-UNIT CHEW Chew 500 mg by mouth daily.    [provider]  cephALEXin (KEFLEX) 500 MG capsule Take 1 capsule (500 mg total) by mouth 3 (three) times daily. 03/22/21   Rodriguez-Southworth, Sunday Spillers, PA-C  chlorhexidine (HIBICLENS) 4 % external liquid Apply topically daily. Use to wash your body below neck 02/04/21   Tsosie Billing, MD  cyanocobalamin 1000 MCG tablet Take 1,000 mcg by mouth daily.    [provider]  cyclobenzaprine (FLEXERIL) 10 MG tablet Take 0.5-1 tablets (5-10 mg total) by mouth at bedtime as needed for muscle spasms. 03/25/21   Karamalegos, Devonne Doughty, DO  diphenhydrAMINE (BENADRYL) 25 MG tablet Take 25 mg by mouth at bedtime as needed for allergies.    [provider]  ferrous sulfate 325 (65 FE) MG tablet Take 1 tablet by mouth every other day. 04/16/21 04/16/22  [provider]  folic acid (FOLVITE) 1 MG tablet Take 1 mg by mouth daily.    [provider]  furosemide (LASIX) 20 MG tablet TAKE 1 TABLET BY MOUTH  DAILY  01/08/21   Parks Ranger, Devonne Doughty, DO  gabapentin (NEURONTIN) 300 MG capsule Take 300 mg by mouth 3 (three) times daily. 04/15/21   [provider]  gabapentin (NEURONTIN) 600 MG tablet TAKE 2 TABLETS BY MOUTH  TWICE DAILY 01/08/21   Parks Ranger, Devonne Doughty, DO  gentamicin cream (GARAMYCIN) 0.1 % APPLY TO AFFECTED AREA 1 TIMES DAILY 12/03/20   [provider]  Glucosamine Sulfate 1000 MG CAPS Take 1 capsule by mouth daily.    [provider]  Lecithin 400 MG CAPS Take 400 mg by mouth daily.    [provider]  LECITHIN-19 PO Take 4 tablets by mouth daily.    [provider]  losartan (COZAAR) 50 MG tablet Take 50 mg by mouth daily. 02/09/21   [provider]  melatonin 5 MG TABS Take 5 mg by mouth at bedtime.    [provider]  Omega 3 1000 MG CAPS Take 1,000 mg by mouth daily.    [provider]  omeprazole (PRILOSEC) 20 MG capsule TAKE 1 CAPSULE BY MOUTH  TWICE DAILY BEFORE MEALS 01/08/21   Karamalegos, Devonne Doughty, DO  oxyCODONE (OXY IR/ROXICODONE) 5 MG immediate release tablet Take 1 tablet by mouth every 4 (four) hours as needed. 04/15/21   [provider]  potassium chloride SA (KLOR-CON) 20 MEQ tablet Take 1 tablet (20 mEq total) by mouth 2 (two) times daily. 09/30/20   Karamalegos, Devonne Doughty, DO  pravastatin (PRAVACHOL) 80 MG tablet TAKE 1 TABLET BY MOUTH  DAILY 01/08/21   Parks Ranger, Devonne Doughty, DO  primidone (MYSOLINE) 50 MG tablet Take 1-2 tablets (50-100 mg total) by mouth See admin instructions. Take 2 tablets (100mg ) by mouth every morning and take 1 tablet (50mg ) by mouth every night 01/08/21   Karamalegos, Devonne Doughty, DO  promethazine (PHENERGAN) 12.5 MG tablet Take 1-2 tablets (12.5-25 mg total) by mouth every 8 (eight) hours as needed for nausea or vomiting. Caution sedation. 01/28/21   Karamalegos, Devonne Doughty, DO  Selenium 200 MCG CAPS Take 200 mcg by mouth daily.  [provider]  traMADol  (ULTRAM) 50 MG tablet Take 1-2 tablets (50-100 mg total) by mouth every 8 (eight) hours as needed. 03/25/21   Karamalegos, Devonne Doughty, DO  tretinoin (RETIN-A) 0.05 % cream Apply 1 application topically at bedtime.    [provider]  tretinoin (RETIN-A) 0.05 % cream Apply 1 application topically daily as needed.    [provider]  triamcinolone (KENALOG) 0.1 % Apply 1 application topically 2 (two) times daily. (apply to hands, legs and feet) 07/27/20   [provider]    Physical Exam: Vitals:   04/17/2021 0747 04/23/2021 0910  BP: 118/66   Pulse: 96   Resp: 18   Temp: 98.7 F (37.1 C)   TempSrc: Axillary   SpO2: 98% 98%     General:  Appears calm and comfortable and is in NAD; appears chronically ill and frail; clearly disoriented, nonsensical speech Eyes:  EOMI, normal lids, iris ENT:  hard of hearing, dry lips & tongue, dry mm Neck:  no LAD, masses or thyromegaly Cardiovascular:  Irregularly irregular with tachycardia to 140s. No LE edema.  Fullness along sternoclavicular junction.    Respiratory:   CTA bilaterally with no wheezes/rales/rhonchi.  Normal respiratory effort. Abdomen:  soft, NT, mildly distended Skin:  steristrips in place along R upper abdomen from recent melanoma excision    Musculoskeletal:  contractures of fingers on B hands, s/p L great toe amputation Psychiatric:  disoriented mood and affect, speech nonsensical, unable to effectively answer questions Neurologic:  unable to perform    Radiological Exams on Admission: Independently reviewed - see discussion in A/P where applicable  CT HEAD WO CONTRAST (5MM)  Result Date: 04/17/2021 CLINICAL DATA:  Mental status change, unknown cause. Sepsis. Recent fall. EXAM: CT HEAD WITHOUT CONTRAST TECHNIQUE: Contiguous axial images were obtained from the base of the skull through the vertex without intravenous contrast. COMPARISON:  Report of 04/10/2021 head CT from [facility] FINDINGS: Brain: A  small mixed density subdural hematoma over the left cerebral convexity measures up to 5 mm in thickness (4 mm thickness reported on the recent outside CT). A small subdural fluid collection over the right cerebral convexity measures up to 1.3 cm in thickness in the frontal region and is diffusely hypoattenuating with density similar to only slightly greater than that of CSF; this was not described in the recent outside report. There is associated right frontal gyral flattening without midline shift or other significant mass effect. Hypodensities in the cerebral white matter bilaterally are nonspecific but compatible with mild-to-moderate chronic small vessel ischemic disease. There is mild cerebral atrophy. No acute infarct, parenchymal hemorrhage, subarachnoid hemorrhage, or mass is identified. Vascular: Calcified atherosclerosis at the skull base. No hyperdense vessel. Skull: No fracture or suspicious osseous lesion. Sinuses/Orbits: Mild mucosal thickening or small volume fluid in the left sphenoid sinus. Visualized mastoid air cells are clear. Unremarkable orbits. Other: Mild left occipital scalp swelling with skin staples in place. IMPRESSION: 1. Small mixed density subdural hematoma over the left cerebral convexity, similar in size to that described on the recent outside CT. 2. Small low-density subdural collection in the right frontal region suggestive of a subdural hygroma and potentially new. No midline shift. 3. Mild-to-moderate chronic small vessel ischemic disease. Electronically Signed   By: Logan Bores M.D.   On: 04/17/2021 11:38   CT Angio Chest PE W and/or Wo Contrast  Result Date: 04/17/2021 CLINICAL DATA:  PE suspected. Sepsis. EXAM: CT ANGIOGRAPHY CHEST CT ABDOMEN AND PELVIS WITH CONTRAST  TECHNIQUE: Multidetector CT imaging of the chest was performed using the standard protocol during bolus administration of intravenous contrast. Multiplanar CT image reconstructions and MIPs were obtained to  evaluate the vascular anatomy. Multidetector CT imaging of the abdomen and pelvis was performed using the standard protocol during bolus administration of intravenous contrast. CONTRAST:  154mL OMNIPAQUE IOHEXOL 350 MG/ML SOLN COMPARISON:  None. FINDINGS: CTA CHEST FINDINGS Cardiovascular: Suboptimal visualization of the segmental pulmonary arteries in the lower thorax due to streak artifact. Accounting for this, no evidence of pulmonary embolus. Prior CABG. Normal heart size. Minimal pericardial effusion. Mediastinum/Nodes: No enlarged mediastinal, hilar, or axillary lymph nodes. Thyroid gland is normal. There is polypoid soft tissue density opacification along the right hand side of the proximal trachea. The main bronchi are patent. There is a high density gas and fluid collection in the anterior mediastinum, posterior to the sternum measuring 5.6 x 3.1 cm. Curvilinear right opacification within this collection suggests possible active extravasation. There is an associated gas and fluid collection anterior to the sternum, at the level of the third median sternotomy wire. There is a transverse fracture of the body of the sternum, likely acute and causing the above-mentioned collections. Lungs/Pleura: There are small bilateral pleural effusions with bibasilar atelectasis. Peripheral nodular opacities are seen in the left upper lobe and right upper lobe with prominent cavitation within the left upper lobe opacity. The findings are suggestive of septic emboli. Small nodular opacities are seen in the left lung apex. Musculoskeletal: Transverse fracture of the manubrium. Age indeterminate compression fracture of T7 vertebral body with approximately 30% height loss. Review of the MIP images confirms the above findings. CT ABDOMEN and PELVIS FINDINGS Hepatobiliary: No focal liver abnormality is seen. No gallstones, gallbladder wall thickening, or biliary dilatation. Pancreas: Unremarkable. No pancreatic ductal dilatation  or surrounding inflammatory changes. Spleen: Normal in size without focal abnormality. Adrenals/Urinary Tract: Adrenal glands are unremarkable. Kidneys are normal, without renal calculi, focal lesion, or hydronephrosis. Bladder is decompressed around urinary Foley. Diffuse irregular urinary bladder wall thickening. Stomach/Bowel: Moderate hiatal hernia. No evidence of small-bowel obstruction. Large amount of formed stool throughout the colon. Vascular/Lymphatic: Aortic atherosclerosis. No grossly enlarged abdominal or pelvic lymph nodes. Shotty retroperitoneal lymph nodes. Reproductive: Prior prostatectomy. Other: Fat containing periumbilical anterior abdominal wall hernia. Musculoskeletal: Age indeterminate severe compression fracture of L1 vertebral body with approximately 80% height loss, and 9 mm retropulsion. Age-indeterminate transverse fracture of T12 vertebral body, with approximately 10% height loss. 2-3 mm of retropulsion to the spinal canal. Soft tissue and small amount of gas are seen surrounding the fracture line. The effect of the soft tissue on the spinal cord cannot be well evaluated. Review of the MIP images confirms the above findings. IMPRESSION: 1. Suboptimal visualization of the segmental pulmonary arteries in the lower thorax due to streak artifact. Accounting for this, no evidence of pulmonary embolus. 2. 5.6 x 3.1 cm high density gas and fluid collection in the anterior mediastinum, posterior to the sternum. Curvilinear bright opacification within this collection suggests possible active extravasation. 3. Transverse fracture of the body of the sternum, likely acute and causing the above-mentioned collections. 4 mm posterior displacement of the superior fracture line. 4. Small bilateral pleural effusions with bibasilar atelectasis. 5. Peripheral nodular opacities in the left upper lobe and right upper lobe with prominent cavitation within the left upper lobe opacity. The findings are  suggestive of septic emboli. 6. Polypoid soft tissue density opacification along the hand side of the proximal trachea. This may  represent adherent secretions, however, endobronchial lesion cannot be excluded. 7. Age indeterminate compression fractures of T7, L1 and T12 vertebral bodies. 9 mm retropulsion at the L1 fracture line. Indistinct fracture line and soft tissue surrounding the T12 vertebral body fracture raises the question about possible pathologic fracture with questionable osteomyelitis. The effect on the spinal cord from these fractures cannot be well evaluated. If there is clinical concern for neurologic symptoms, consider evaluation with contrast enhanced MRI. 8. Large amount of formed stool throughout the colon, query constipation. 9. Diffuse irregular thickening of the urinary bladder wall, not well visualized due to its decompressed state. 10. Fat containing periumbilical anterior abdominal wall hernia. 11. Prior prostatectomy. 12. Aortic atherosclerosis. Aortic Atherosclerosis (ICD10-I70.0). These results were called by telephone at the time of interpretation on 04/17/2021 at 1:00 pm to provider [facility] , who verbally acknowledged these results. Electronically Signed   By: Fidela Salisbury M.D.   On: 04/17/2021 13:14   CT ABDOMEN PELVIS W CONTRAST  Result Date: 04/17/2021 CLINICAL DATA:  PE suspected. Sepsis. EXAM: CT ANGIOGRAPHY CHEST CT ABDOMEN AND PELVIS WITH CONTRAST TECHNIQUE: Multidetector CT imaging of the chest was performed using the standard protocol during bolus administration of intravenous contrast. Multiplanar CT image reconstructions and MIPs were obtained to evaluate the vascular anatomy. Multidetector CT imaging of the abdomen and pelvis was performed using the standard protocol during bolus administration of intravenous contrast. CONTRAST:  151mL OMNIPAQUE IOHEXOL 350 MG/ML SOLN COMPARISON:  None. FINDINGS: CTA CHEST FINDINGS Cardiovascular: Suboptimal visualization of  the segmental pulmonary arteries in the lower thorax due to streak artifact. Accounting for this, no evidence of pulmonary embolus. Prior CABG. Normal heart size. Minimal pericardial effusion. Mediastinum/Nodes: No enlarged mediastinal, hilar, or axillary lymph nodes. Thyroid gland is normal. There is polypoid soft tissue density opacification along the right hand side of the proximal trachea. The main bronchi are patent. There is a high density gas and fluid collection in the anterior mediastinum, posterior to the sternum measuring 5.6 x 3.1 cm. Curvilinear right opacification within this collection suggests possible active extravasation. There is an associated gas and fluid collection anterior to the sternum, at the level of the third median sternotomy wire. There is a transverse fracture of the body of the sternum, likely acute and causing the above-mentioned collections. Lungs/Pleura: There are small bilateral pleural effusions with bibasilar atelectasis. Peripheral nodular opacities are seen in the left upper lobe and right upper lobe with prominent cavitation within the left upper lobe opacity. The findings are suggestive of septic emboli. Small nodular opacities are seen in the left lung apex. Musculoskeletal: Transverse fracture of the manubrium. Age indeterminate compression fracture of T7 vertebral body with approximately 30% height loss. Review of the MIP images confirms the above findings. CT ABDOMEN and PELVIS FINDINGS Hepatobiliary: No focal liver abnormality is seen. No gallstones, gallbladder wall thickening, or biliary dilatation. Pancreas: Unremarkable. No pancreatic ductal dilatation or surrounding inflammatory changes. Spleen: Normal in size without focal abnormality. Adrenals/Urinary Tract: Adrenal glands are unremarkable. Kidneys are normal, without renal calculi, focal lesion, or hydronephrosis. Bladder is decompressed around urinary Foley. Diffuse irregular urinary bladder wall thickening.  Stomach/Bowel: Moderate hiatal hernia. No evidence of small-bowel obstruction. Large amount of formed stool throughout the colon. Vascular/Lymphatic: Aortic atherosclerosis. No grossly enlarged abdominal or pelvic lymph nodes. Shotty retroperitoneal lymph nodes. Reproductive: Prior prostatectomy. Other: Fat containing periumbilical anterior abdominal wall hernia. Musculoskeletal: Age indeterminate severe compression fracture of L1 vertebral body with approximately 80% height loss, and 9 mm  retropulsion. Age-indeterminate transverse fracture of T12 vertebral body, with approximately 10% height loss. 2-3 mm of retropulsion to the spinal canal. Soft tissue and small amount of gas are seen surrounding the fracture line. The effect of the soft tissue on the spinal cord cannot be well evaluated. Review of the MIP images confirms the above findings. IMPRESSION: 1. Suboptimal visualization of the segmental pulmonary arteries in the lower thorax due to streak artifact. Accounting for this, no evidence of pulmonary embolus. 2. 5.6 x 3.1 cm high density gas and fluid collection in the anterior mediastinum, posterior to the sternum. Curvilinear bright opacification within this collection suggests possible active extravasation. 3. Transverse fracture of the body of the sternum, likely acute and causing the above-mentioned collections. 4 mm posterior displacement of the superior fracture line. 4. Small bilateral pleural effusions with bibasilar atelectasis. 5. Peripheral nodular opacities in the left upper lobe and right upper lobe with prominent cavitation within the left upper lobe opacity. The findings are suggestive of septic emboli. 6. Polypoid soft tissue density opacification along the hand side of the proximal trachea. This may represent adherent secretions, however, endobronchial lesion cannot be excluded. 7. Age indeterminate compression fractures of T7, L1 and T12 vertebral bodies. 9 mm retropulsion at the L1 fracture  line. Indistinct fracture line and soft tissue surrounding the T12 vertebral body fracture raises the question about possible pathologic fracture with questionable osteomyelitis. The effect on the spinal cord from these fractures cannot be well evaluated. If there is clinical concern for neurologic symptoms, consider evaluation with contrast enhanced MRI. 8. Large amount of formed stool throughout the colon, query constipation. 9. Diffuse irregular thickening of the urinary bladder wall, not well visualized due to its decompressed state. 10. Fat containing periumbilical anterior abdominal wall hernia. 11. Prior prostatectomy. 12. Aortic atherosclerosis. Aortic Atherosclerosis (ICD10-I70.0). These results were called by telephone at the time of interpretation on 04/17/2021 at 1:00 pm to provider [facility] , who verbally acknowledged these results. Electronically Signed   By: Fidela Salisbury M.D.   On: 04/17/2021 13:14   US Venous Img Upper Uni Left (DVT)  Result Date: 04/17/2021 CLINICAL DATA:  Left upper extremity swelling EXAM: LEFT UPPER EXTREMITY VENOUS DOPPLER ULTRASOUND TECHNIQUE: Gray-scale sonography with graded compression, as well as color Doppler and duplex ultrasound were performed to evaluate the upper extremity deep venous system from the level of the subclavian vein and including the jugular, axillary, basilic, radial, ulnar and upper cephalic vein. Spectral Doppler was utilized to evaluate flow at rest and with distal augmentation maneuvers. COMPARISON:  None. FINDINGS: Contralateral Subclavian Vein: Respiratory phasicity is normal and symmetric with the symptomatic side. No evidence of thrombus. Normal compressibility. Internal Jugular Vein: No evidence of thrombus. Normal compressibility, respiratory phasicity and response to augmentation. Subclavian Vein: No evidence of thrombus. Normal compressibility, respiratory phasicity and response to augmentation. Axillary Vein: No evidence of  thrombus. Normal compressibility, respiratory phasicity and response to augmentation. Cephalic Vein: Acute expansile near occlusive superficial thrombophlebitis of the distal left cephalic vein in the region of the antecubital fossa at a former IV site. Proximal and mid left cephalic vein are patent and compressible. Basilic Vein: No evidence of thrombus. Normal compressibility, respiratory phasicity and response to augmentation. Brachial Veins: No evidence of thrombus. Normal compressibility, respiratory phasicity and response to augmentation. Radial Veins: No evidence of thrombus. Normal compressibility, respiratory phasicity and response to augmentation. Ulnar Veins: No evidence of thrombus. Normal compressibility, respiratory phasicity and response to augmentation. Venous Reflux:  None  visualized. Other Findings:  None visualized. IMPRESSION: No evidence of deep venous thrombosis within the left upper extremity. Acute near occlusive superficial thrombophlebitis of the distal left cephalic vein in the region of the antecubital fossa. Electronically Signed   By: Ilona Sorrel M.D.   On: 04/17/2021 11:20   DG Chest Portable 1 View  Result Date: 04/17/2021 CLINICAL DATA:  Status post central line placement EXAM: PORTABLE CHEST 1 VIEW COMPARISON:  Film from earlier in the same day. FINDINGS: New left jugular central line is noted with the catheter tip at the cavoatrial junction. No pneumothorax is noted. Cardiac shadow is stable. Postsurgical changes are again noted. Patchy airspace disease is again noted in the right mid lung. No new focal abnormality is noted. IMPRESSION: No pneumothorax following central line placement on the left. Electronically Signed   By: Inez Catalina M.D.   On: 04/17/2021 21:53   DG Chest Portable 1 View  Result Date: 04/17/2021 CLINICAL DATA:  Sepsis.  Hypoxia. EXAM: PORTABLE CHEST 1 VIEW COMPARISON:  08/17/2020 FINDINGS: Stable mild cardiomegaly. Prior CABG again noted. New  ill-defined airspace opacity is seen in the right midlung. Left lung is clear. No evidence of pleural effusion. IMPRESSION: New ill-defined airspace opacity in right midlung. Electronically Signed   By: Marlaine Hind M.D.   On: 04/17/2021 11:47    EKG: Independently reviewed.  Afib with rate 113; no evidence of acute ischemia   Labs on Admission: I have personally reviewed the available labs and imaging studies at the time of the admission.  Pertinent labs:   CO2 19 Glucose 131 Albumin 2.0 AST 43/ALT 24/Bili 1.3 HS troponin 24 WBC 11.6 Hgb 8.9 Lactate 2.6, 2.9, 1.6, 1.6 UA: small Hgb, 5 ketones, 100 protein, few bacteria, >50 WBC Blood culture with BCID + for staph aureus, MRSA COVID/flu negative   Assessment/Plan Principal Problem:   Sepsis due to methicillin resistant Staphylococcus aureus (MRSA) (HCC) Active Problems:   History of prostate cancer   Essential hypertension   CAD (coronary artery disease)   Hyperlipidemia, mixed   Lumbar compression fracture (HCC)   Atrial fibrillation with RVR (HCC)   Sternal wound infection   SDH (subdural hematoma)   Pneumonia involving right lung   Delirium   DNR (do not resuscitate)    Sepsis due to MRSA bacteremia -Sepsis indicates life-threatening organ dysfunction with mortality >10%, caused by dysregulation to host response.   -SIRS criteria in this patient includes: Leukocytosis, tachycardia, tachypnea, hypoxia  -Patient has evidence of acute organ failure with elevated lactate >2 that is not easily explained by another condition. -Sepsis protocol initiated -Blood cultures are positive for MRSA -Septic emboli appreciated on imaging -Suspected source is unknown - PNA, spinal osteo, recent skin surgery are considerations (see below) -Blood and urine cultures pending -Will admit due to: hemodynamic instability; bacteremia; AMS that is severe or persistent -Treat with IV Vanc/Zosyn for broad coverage -Lactate has cleared -Will  order procalcitonin level.    Anterior mediastinal abscess -Mediastinal fluid collection with gas/fluid  -Sternal fracture appears to be the source of aforementioned issue -CT surgery consulted -He was urgently transferred and so CT surgery was called first thing this AM, but in our conversation it appears that this is a lesser consideration compared to his other issues -He is also a poor surgical candidate -He is currently being treated with Vanc/Zosyn  SDH -Recent SDH x 2, acute on chronic, seen at [facility] -Also with compression fractures, ?osteomyelitis -Will ask neurosurgery to review images to  ensure that no intervention is needed -AC and ASA are contraindicated at this time  R midlung PNA -No obvious respiratory symptoms but patient does have an apparent new O2 requirement with O2 sats 90% at [facility] -He also appears to have septic emboli -PNA may be the source but there are also other considerations -Treatment with Vanc/Zosyn continued for now -He was also noted to have a ?tracheal lesion - could be adherent secretions vs. Endobronchial lesion  New onset afib -He did have tachycardia during his recent hospitalization but this was reported to be sinus tachycardia -Afib is currently confirmed on EKG -He has been started on Cardizem drip; will continue for now without transition -No AC due to SDH  Abdominal wall melanoma -Steri-strips in place, no obvious infection -Pathology report reviewed with evidence of complete resection and negative margins  L AC superficial thrombophlebitis -Heat packs for now as needed -No DVT noted on imaging -He is not a candidate for [facility] or even ASA at this time  HTN -Hold Norvasc and Cozaar for now given marginal BPs  HLD -Hold pravastatin for now, may be able to resume tomorrow  Remote prostate CA -Bladder wall thickening noted on imaging -For now, this is a lesser consideration but may need further evaluation  CAD -s/p remote  CABG  Delirium -Thought to be related to infection, possibly also related to recent traumatic SDH -Hold sedating medications including Primidone, benadryl (consider d/c), Neurontin  DNR -Unable to reach family at the time of admission -He was DNR/DNI during the admission at [facility] and so this was continued at the time of admission -He has a very guarded prognosis -Palliative care consulted    Note: This patient has been tested and is negative for the novel coronavirus COVID-19. He has been fully vaccinated against COVID-19.   Total critical care time: 75 minutes Critical care time was exclusive of separately billable procedures and treating other patients. Critical care was necessary to treat or prevent imminent or life-threatening deterioration. Critical care was time spent personally by me on the following activities: development of treatment plan with patient and/or surrogate as well as nursing, discussions with consultants, evaluation of patient's response to treatment, examination of patient, obtaining history from patient or surrogate, ordering and performing treatments and interventions, ordering and review of laboratory studies, ordering and review of radiographic studies, pulse oximetry and re-evaluation of patient's condition.     DVT prophylaxis:  SCDs Code Status:  DNR - based on last hospitalization Family Communication: None present; I was unable to reach family at the time of admission  Disposition Plan:  The patient is from: home  Anticipated d/c is to: be determined  Anticipated d/c date will depend on clinical response to treatment, but possibly as early as tomorrow if she has excellent response to treatment  Patient is currently: acutely ill Consults called: Palliative care; TOC team; PT/OT starting tomorrow Admission status: Admit - It is my clinical opinion that admission to INPATIENT is reasonable and necessary because of the expectation that this patient will  require hospital care that crosses at least 2 midnights to treat this condition based on the medical complexity of the problems presented.  Given the aforementioned information, the predictability of an adverse outcome is felt to be significant.    Karmen Bongo MD [facility]   How to contact the [facility] Attending or Consulting provider [place] or covering provider during after hours [place], for this patient?  Check the care team in [place]  and look for a) attending/consulting [place] provider listed and b) the [facility] team listed Log into [email or web address] and use [place]'s universal password to access. If you do not have the password, please contact the hospital operator. Locate the [facility] provider you are looking for under [facility] and page to a number that you can be directly reached. If you still have difficulty reaching the provider, please page the [facility] (Director on Call) for the Hospitalists listed on amion for assistance.   04/17/2021, 10:21 AM

## 2021-04-18 NOTE — Progress Notes (Signed)
Patient arrived to the floor via care-link, alert and oriented to self. The Triple lumen placed was dislodged, Petroleum guaze applied. Cardizem running at 15 ml/ hr.  VS obtained, cardiac monitoring initiated. Admitting provider notified, we'll continue to monitor.

## 2021-04-18 NOTE — Consult Note (Signed)
Reason for Consult:sternal wound Referring Physician: Dr. Osker Mason Allard [name] is an 85 y.o. male.  HPI: [name] was transferred to [facility] for management of sepsis.  Patient is confused.  History obtained from chart.  [name] is an 85 year old man with a history of hypertension, hyperlipidemia, prostate cancer CAD, CABG at [facility], melanoma, dementia, and multiple falls.  He had a melanoma excised at [facility] on 04/04/2021.  He was admitted to [facility] after a fall with loss of consciousness.  He was found to have a subdural hematoma.  He had delirium.  He had urinary retention and was sent home with a Foley catheter.  He was discharged from [facility] on 11/4.  He then presented to the ED at [facility] yesterday with fever 100.9 and A. fib with a rapid ventricular response with a heart rate in the 130s to 140s.  CT of the chest showed a transverse sternal fracture with a 5.6 x 3.1 cm surrounding fluid collection.  There were nodules in the right and left upper lobes suggestive of septic emboli.  He was started on broad-spectrum antibiotics and IV diltiazem for his atrial fibrillation.  He is DNR status currently.   Past Medical History:  Diagnosis Date   Arthritis    Benign essential tremor    Fracture, femur ([place]) 2012   GERD (gastroesophageal reflux disease)    H/O echocardiogram    05/2013   Hx of colonoscopy    04/22/2013   Hypercholesterolemia    Hypertension    Myocardial infarction [facility])    1987   Polyneuropathy    Prostate cancer ([place]) 2004   Superficial hematoma     Past Surgical History:  Procedure Laterality Date   APPLICATION OF WOUND VAC Left 08/12/2020   Procedure: APPLICATION OF WOUND VAC;  Surgeon: Samara Deist, DPM;  Location: [facility] ORS;  Service: Podiatry;  Laterality: Left;   BASAL CELL CARCINOMA EXCISION     COLONOSCOPY  05/01/2009   Positive for colonic polyps   COLONOSCOPY WITH PROPOFOL N/A 02/24/2016   Procedure: COLONOSCOPY WITH PROPOFOL;  Surgeon: Christene Lye, MD;  Location: [facility] ENDOSCOPY;  Service: Endoscopy;  Laterality: N/A;   CORONARY ARTERY BYPASS GRAFT  1987   X2   HERNIA REPAIR  1990   KYPHOPLASTY N/A 02/19/2021   Procedure: KYPHOPLASTY-L2&L3;  Surgeon: Hessie Knows, MD;  Location: [facility] ORS;  Service: Orthopedics;  Laterality: N/A;   PROSTATE SURGERY  2004   TEE WITHOUT CARDIOVERSION N/A 08/13/2020   Procedure: TRANSESOPHAGEAL ECHOCARDIOGRAM (TEE);  Surgeon: Corey Skains, MD;  Location: [facility] ORS;  Service: Cardiovascular;  Laterality: N/A;   WOUND DEBRIDEMENT Left 08/12/2020   Procedure: DEBRIDEMENT WOUND-Left Heel;  Surgeon: Samara Deist, DPM;  Location: [facility] ORS;  Service: Podiatry;  Laterality: Left;    Family History  Problem Relation Age of Onset   Heart disease Mother    Congestive Heart Failure Father     Social History:  reports that he has never smoked. He has never used smokeless tobacco. He reports current alcohol use of about 6.0 standard drinks per week. He reports that he does not use drugs.  Allergies:  Allergies  Allergen Reactions   Codeine Nausea And Vomiting   Indocin [Indomethacin] Other (See Comments)   Latex Other (See Comments)    Blisters   Mupirocin     Blisters   Plavix [Clopidogrel Bisulfate]     GI intolerance, n/v   Polysporin [Bacitracin-Polymyxin B]     Blisters  Shellfish Allergy Nausea And Vomiting    Mussels   Tape    Neosporin [Neomycin-Bacitracin Zn-Polymyx] Rash   Other Nausea And Vomiting    mussels    Medications: Scheduled:  azelastine  1 spray Each Nare BID   docusate sodium  100 mg Oral BID   sodium chloride flush  3 mL Intravenous Q12H    Results for orders placed or performed during the hospital encounter of 04/23/2021 (from the past 48 hour(s))  Comprehensive metabolic panel     Status: Abnormal   Collection Time: 04/21/2021  5:39 AM  Result Value Ref Range   Sodium 132 (L) 135 - 145 mmol/L   Potassium 3.8 3.5 - 5.1 mmol/L   Chloride 100 98 - 111 mmol/L   CO2  19 (L) 22 - 32 mmol/L   Glucose, Bld 131 (H) 70 - 99 mg/dL    Comment: Glucose reference range applies only to samples taken after fasting for at least 8 hours.   BUN 23 8 - 23 mg/dL   Creatinine, Ser 0.92 0.61 - 1.24 mg/dL   Calcium 8.9 8.9 - 10.3 mg/dL   Total Protein 6.0 (L) 6.5 - 8.1 g/dL   Albumin 2.0 (L) 3.5 - 5.0 g/dL   AST 43 (H) 15 - 41 U/L   ALT 24 0 - 44 U/L   Alkaline Phosphatase 54 38 - 126 U/L   Total Bilirubin 1.3 (H) 0.3 - 1.2 mg/dL   GFR, Estimated >60 >60 mL/min    Comment: (NOTE) Calculated using the CKD-EPI Creatinine Equation (2021)    Anion gap 13 5 - 15    Comment: Performed at [facility], [place] [address], [place], [place] [address]  CBC with Differential/Platelet     Status: Abnormal   Collection Time: 05/10/2021  5:39 AM  Result Value Ref Range   WBC 11.6 (H) 4.0 - 10.5 K/uL   RBC 2.56 (L) 4.22 - 5.81 MIL/uL   Hemoglobin 8.9 (L) 13.0 - 17.0 g/dL   HCT 27.3 (L) 39.0 - 52.0 %   MCV 106.6 (H) 80.0 - 100.0 fL   MCH 34.8 (H) 26.0 - 34.0 pg   MCHC 32.6 30.0 - 36.0 g/dL   RDW 14.0 11.5 - 15.5 %   Platelets 296 150 - 400 K/uL   nRBC 0.0 0.0 - 0.2 %   Neutrophils Relative % 88 %   Neutro Abs 10.2 (H) 1.7 - 7.7 K/uL   Lymphocytes Relative 7 %   Lymphs Abs 0.8 0.7 - 4.0 K/uL   Monocytes Relative 5 %   Monocytes Absolute 0.6 0.1 - 1.0 K/uL   Eosinophils Relative 0 %   Eosinophils Absolute 0.0 0.0 - 0.5 K/uL   Basophils Relative 0 %   Basophils Absolute 0.0 0.0 - 0.1 K/uL   nRBC 0 0 /100 WBC   Abs Immature Granulocytes 0.00 0.00 - 0.07 K/uL   Burr Cells PRESENT    Polychromasia PRESENT     Comment: Performed at [facility], [place] [address], [place], [place] [address]  Type and screen [place]     Status: None   Collection Time: 05/02/2021  5:39 AM  Result Value Ref Range   ABO/RH(D) AB POS    Antibody Screen NEG    Sample Expiration      04/21/2021,2359 Performed at [facility], [place] [address], [place], [place]  [address]   Troponin I (High Sensitivity)     Status: Abnormal  Collection Time: 05/01/2021  5:39 AM  Result Value Ref Range   Troponin I (High Sensitivity) 24 (H) <18 ng/L    Comment: (NOTE) Elevated high sensitivity troponin I (hsTnI) values and significant  changes across serial measurements may suggest ACS but many other  chronic and acute conditions are known to elevate hsTnI results.  Refer to the "Links" section for chest pain algorithms and additional  guidance. Performed at [facility], [place] [address], [place], [place] [address]   Lactic acid, plasma     Status: None   Collection Time: 04/19/2021  5:39 AM  Result Value Ref Range   Lactic Acid, Venous 1.6 0.5 - 1.9 mmol/L    Comment: Performed at [facility] [address], [place], [place] [address]  ABO/Rh     Status: None   Collection Time: 05/04/2021  6:37 AM  Result Value Ref Range   ABO/RH(D)      AB POS Performed at [place] [facility] [address], [place], [place] [address]   Troponin I (High Sensitivity)     Status: Abnormal   Collection Time: 05/09/2021  7:37 AM  Result Value Ref Range   Troponin I (High Sensitivity) 24 (H) <18 ng/L    Comment: (NOTE) Elevated high sensitivity troponin I (hsTnI) values and significant  changes across serial measurements may suggest ACS but many other  chronic and acute conditions are known to elevate hsTnI results.  Refer to the "Links" section for chest pain algorithms and additional  guidance. Performed at [facility], [place] [address] [place], [place] [address]   Procalcitonin     Status: None   Collection Time: 05/02/2021  7:37 AM  Result Value Ref Range   Procalcitonin 3.69 ng/mL    Comment:        Interpretation: PCT > 2 ng/mL: Systemic infection (sepsis) is likely, unless other causes are known. (NOTE)       Sepsis PCT Algorithm           Lower Respiratory Tract                                      Infection PCT Algorithm     ----------------------------     ----------------------------         PCT < 0.25 ng/mL                PCT < 0.10 ng/mL          Strongly encourage             Strongly discourage   discontinuation of antibiotics    initiation of antibiotics    ----------------------------     -----------------------------       PCT 0.25 - 0.50 ng/mL            PCT 0.10 - 0.25 ng/mL               OR       >80% decrease in PCT            Discourage initiation of                                            antibiotics      Encourage discontinuation  of antibiotics    ----------------------------     -----------------------------         PCT >= 0.50 ng/mL              PCT 0.26 - 0.50 ng/mL               AND       <80% decrease in PCT              Encourage initiation of                                             antibiotics       Encourage continuation           of antibiotics    ----------------------------     -----------------------------        PCT >= 0.50 ng/mL                  PCT > 0.50 ng/mL               AND         increase in PCT                  Strongly encourage                                      initiation of antibiotics    Strongly encourage escalation           of antibiotics                                     -----------------------------                                           PCT <= 0.25 ng/mL                                                 OR                                        > 80% decrease in PCT                                      Discontinue / Do not initiate                                             antibiotics  Performed at [facility], [address], [place], [place] [address]     CT HEAD WO CONTRAST (5MM)  Result Date: 04/17/2021 CLINICAL DATA:  Mental status change, unknown cause. Sepsis. Recent fall. EXAM: CT HEAD WITHOUT CONTRAST TECHNIQUE: Contiguous axial  images were obtained from the base of the skull through the vertex without  intravenous contrast. COMPARISON:  Report of 04/10/2021 head CT from [facility] FINDINGS: Brain: A small mixed density subdural hematoma over the left cerebral convexity measures up to 5 mm in thickness (4 mm thickness reported on the recent outside CT). A small subdural fluid collection over the right cerebral convexity measures up to 1.3 cm in thickness in the frontal region and is diffusely hypoattenuating with density similar to only slightly greater than that of CSF; this was not described in the recent outside report. There is associated right frontal gyral flattening without midline shift or other significant mass effect. Hypodensities in the cerebral white matter bilaterally are nonspecific but compatible with mild-to-moderate chronic small vessel ischemic disease. There is mild cerebral atrophy. No acute infarct, parenchymal hemorrhage, subarachnoid hemorrhage, or mass is identified. Vascular: Calcified atherosclerosis at the skull base. No hyperdense vessel. Skull: No fracture or suspicious osseous lesion. Sinuses/Orbits: Mild mucosal thickening or small volume fluid in the left sphenoid sinus. Visualized mastoid air cells are clear. Unremarkable orbits. Other: Mild left occipital scalp swelling with skin staples in place. IMPRESSION: 1. Small mixed density subdural hematoma over the left cerebral convexity, similar in size to that described on the recent outside CT. 2. Small low-density subdural collection in the right frontal region suggestive of a subdural hygroma and potentially new. No midline shift. 3. Mild-to-moderate chronic small vessel ischemic disease. Electronically Signed   By: Logan Bores M.D.   On: 04/17/2021 11:38   CT Angio Chest PE W and/or Wo Contrast  Result Date: 04/17/2021 CLINICAL DATA:  PE suspected. Sepsis. EXAM: CT ANGIOGRAPHY CHEST CT ABDOMEN AND PELVIS WITH CONTRAST TECHNIQUE: Multidetector CT imaging of the chest was performed using the standard protocol during bolus  administration of intravenous contrast. Multiplanar CT image reconstructions and MIPs were obtained to evaluate the vascular anatomy. Multidetector CT imaging of the abdomen and pelvis was performed using the standard protocol during bolus administration of intravenous contrast. CONTRAST:  119mL OMNIPAQUE IOHEXOL 350 MG/ML SOLN COMPARISON:  None. FINDINGS: CTA CHEST FINDINGS Cardiovascular: Suboptimal visualization of the segmental pulmonary arteries in the lower thorax due to streak artifact. Accounting for this, no evidence of pulmonary embolus. Prior CABG. Normal heart size. Minimal pericardial effusion. Mediastinum/Nodes: No enlarged mediastinal, hilar, or axillary lymph nodes. Thyroid gland is normal. There is polypoid soft tissue density opacification along the right hand side of the proximal trachea. The main bronchi are patent. There is a high density gas and fluid collection in the anterior mediastinum, posterior to the sternum measuring 5.6 x 3.1 cm. Curvilinear right opacification within this collection suggests possible active extravasation. There is an associated gas and fluid collection anterior to the sternum, at the level of the third median sternotomy wire. There is a transverse fracture of the body of the sternum, likely acute and causing the above-mentioned collections. Lungs/Pleura: There are small bilateral pleural effusions with bibasilar atelectasis. Peripheral nodular opacities are seen in the left upper lobe and right upper lobe with prominent cavitation within the left upper lobe opacity. The findings are suggestive of septic emboli. Small nodular opacities are seen in the left lung apex. Musculoskeletal: Transverse fracture of the manubrium. Age indeterminate compression fracture of T7 vertebral body with approximately 30% height loss. Review of the MIP images confirms the above findings. CT ABDOMEN and PELVIS FINDINGS Hepatobiliary: No focal liver abnormality is seen. No gallstones,  gallbladder wall thickening, or biliary dilatation. Pancreas: Unremarkable. No pancreatic  ductal dilatation or surrounding inflammatory changes. Spleen: Normal in size without focal abnormality. Adrenals/Urinary Tract: Adrenal glands are unremarkable. Kidneys are normal, without renal calculi, focal lesion, or hydronephrosis. Bladder is decompressed around urinary Foley. Diffuse irregular urinary bladder wall thickening. Stomach/Bowel: Moderate hiatal hernia. No evidence of small-bowel obstruction. Large amount of formed stool throughout the colon. Vascular/Lymphatic: Aortic atherosclerosis. No grossly enlarged abdominal or pelvic lymph nodes. Shotty retroperitoneal lymph nodes. Reproductive: Prior prostatectomy. Other: Fat containing periumbilical anterior abdominal wall hernia. Musculoskeletal: Age indeterminate severe compression fracture of L1 vertebral body with approximately 80% height loss, and 9 mm retropulsion. Age-indeterminate transverse fracture of T12 vertebral body, with approximately 10% height loss. 2-3 mm of retropulsion to the spinal canal. Soft tissue and small amount of gas are seen surrounding the fracture line. The effect of the soft tissue on the spinal cord cannot be well evaluated. Review of the MIP images confirms the above findings. IMPRESSION: 1. Suboptimal visualization of the segmental pulmonary arteries in the lower thorax due to streak artifact. Accounting for this, no evidence of pulmonary embolus. 2. 5.6 x 3.1 cm high density gas and fluid collection in the anterior mediastinum, posterior to the sternum. Curvilinear bright opacification within this collection suggests possible active extravasation. 3. Transverse fracture of the body of the sternum, likely acute and causing the above-mentioned collections. 4 mm posterior displacement of the superior fracture line. 4. Small bilateral pleural effusions with bibasilar atelectasis. 5. Peripheral nodular opacities in the left upper lobe  and right upper lobe with prominent cavitation within the left upper lobe opacity. The findings are suggestive of septic emboli. 6. Polypoid soft tissue density opacification along the hand side of the proximal trachea. This may represent adherent secretions, however, endobronchial lesion cannot be excluded. 7. Age indeterminate compression fractures of T7, L1 and T12 vertebral bodies. 9 mm retropulsion at the L1 fracture line. Indistinct fracture line and soft tissue surrounding the T12 vertebral body fracture raises the question about possible pathologic fracture with questionable osteomyelitis. The effect on the spinal cord from these fractures cannot be well evaluated. If there is clinical concern for neurologic symptoms, consider evaluation with contrast enhanced MRI. 8. Large amount of formed stool throughout the colon, query constipation. 9. Diffuse irregular thickening of the urinary bladder wall, not well visualized due to its decompressed state. 10. Fat containing periumbilical anterior abdominal wall hernia. 11. Prior prostatectomy. 12. Aortic atherosclerosis. Aortic Atherosclerosis (ICD10-I70.0). These results were called by telephone at the time of interpretation on 04/17/2021 at 1:00 pm to provider [facility] , who verbally acknowledged these results. Electronically Signed   By: Fidela Salisbury M.D.   On: 04/17/2021 13:14   CT ABDOMEN PELVIS W CONTRAST  Result Date: 04/17/2021 CLINICAL DATA:  PE suspected. Sepsis. EXAM: CT ANGIOGRAPHY CHEST CT ABDOMEN AND PELVIS WITH CONTRAST TECHNIQUE: Multidetector CT imaging of the chest was performed using the standard protocol during bolus administration of intravenous contrast. Multiplanar CT image reconstructions and MIPs were obtained to evaluate the vascular anatomy. Multidetector CT imaging of the abdomen and pelvis was performed using the standard protocol during bolus administration of intravenous contrast. CONTRAST:  184mL OMNIPAQUE IOHEXOL 350  MG/ML SOLN COMPARISON:  None. FINDINGS: CTA CHEST FINDINGS Cardiovascular: Suboptimal visualization of the segmental pulmonary arteries in the lower thorax due to streak artifact. Accounting for this, no evidence of pulmonary embolus. Prior CABG. Normal heart size. Minimal pericardial effusion. Mediastinum/Nodes: No enlarged mediastinal, hilar, or axillary lymph nodes. Thyroid gland is normal. There is polypoid soft tissue  density opacification along the right hand side of the proximal trachea. The main bronchi are patent. There is a high density gas and fluid collection in the anterior mediastinum, posterior to the sternum measuring 5.6 x 3.1 cm. Curvilinear right opacification within this collection suggests possible active extravasation. There is an associated gas and fluid collection anterior to the sternum, at the level of the third median sternotomy wire. There is a transverse fracture of the body of the sternum, likely acute and causing the above-mentioned collections. Lungs/Pleura: There are small bilateral pleural effusions with bibasilar atelectasis. Peripheral nodular opacities are seen in the left upper lobe and right upper lobe with prominent cavitation within the left upper lobe opacity. The findings are suggestive of septic emboli. Small nodular opacities are seen in the left lung apex. Musculoskeletal: Transverse fracture of the manubrium. Age indeterminate compression fracture of T7 vertebral body with approximately 30% height loss. Review of the MIP images confirms the above findings. CT ABDOMEN and PELVIS FINDINGS Hepatobiliary: No focal liver abnormality is seen. No gallstones, gallbladder wall thickening, or biliary dilatation. Pancreas: Unremarkable. No pancreatic ductal dilatation or surrounding inflammatory changes. Spleen: Normal in size without focal abnormality. Adrenals/Urinary Tract: Adrenal glands are unremarkable. Kidneys are normal, without renal calculi, focal lesion, or  hydronephrosis. Bladder is decompressed around urinary Foley. Diffuse irregular urinary bladder wall thickening. Stomach/Bowel: Moderate hiatal hernia. No evidence of small-bowel obstruction. Large amount of formed stool throughout the colon. Vascular/Lymphatic: Aortic atherosclerosis. No grossly enlarged abdominal or pelvic lymph nodes. Shotty retroperitoneal lymph nodes. Reproductive: Prior prostatectomy. Other: Fat containing periumbilical anterior abdominal wall hernia. Musculoskeletal: Age indeterminate severe compression fracture of L1 vertebral body with approximately 80% height loss, and 9 mm retropulsion. Age-indeterminate transverse fracture of T12 vertebral body, with approximately 10% height loss. 2-3 mm of retropulsion to the spinal canal. Soft tissue and small amount of gas are seen surrounding the fracture line. The effect of the soft tissue on the spinal cord cannot be well evaluated. Review of the MIP images confirms the above findings. IMPRESSION: 1. Suboptimal visualization of the segmental pulmonary arteries in the lower thorax due to streak artifact. Accounting for this, no evidence of pulmonary embolus. 2. 5.6 x 3.1 cm high density gas and fluid collection in the anterior mediastinum, posterior to the sternum. Curvilinear bright opacification within this collection suggests possible active extravasation. 3. Transverse fracture of the body of the sternum, likely acute and causing the above-mentioned collections. 4 mm posterior displacement of the superior fracture line. 4. Small bilateral pleural effusions with bibasilar atelectasis. 5. Peripheral nodular opacities in the left upper lobe and right upper lobe with prominent cavitation within the left upper lobe opacity. The findings are suggestive of septic emboli. 6. Polypoid soft tissue density opacification along the hand side of the proximal trachea. This may represent adherent secretions, however, endobronchial lesion cannot be excluded. 7.  Age indeterminate compression fractures of T7, L1 and T12 vertebral bodies. 9 mm retropulsion at the L1 fracture line. Indistinct fracture line and soft tissue surrounding the T12 vertebral body fracture raises the question about possible pathologic fracture with questionable osteomyelitis. The effect on the spinal cord from these fractures cannot be well evaluated. If there is clinical concern for neurologic symptoms, consider evaluation with contrast enhanced MRI. 8. Large amount of formed stool throughout the colon, query constipation. 9. Diffuse irregular thickening of the urinary bladder wall, not well visualized due to its decompressed state. 10. Fat containing periumbilical anterior abdominal wall hernia. 11. Prior prostatectomy. 12.  Aortic atherosclerosis. Aortic Atherosclerosis (ICD10-I70.0). These results were called by telephone at the time of interpretation on 04/17/2021 at 1:00 pm to provider [facility] , who verbally acknowledged these results. Electronically Signed   By: Fidela Salisbury M.D.   On: 04/17/2021 13:14   US Venous Img Upper Uni Left (DVT)  Result Date: 04/17/2021 CLINICAL DATA:  Left upper extremity swelling EXAM: LEFT UPPER EXTREMITY VENOUS DOPPLER ULTRASOUND TECHNIQUE: Gray-scale sonography with graded compression, as well as color Doppler and duplex ultrasound were performed to evaluate the upper extremity deep venous system from the level of the subclavian vein and including the jugular, axillary, basilic, radial, ulnar and upper cephalic vein. Spectral Doppler was utilized to evaluate flow at rest and with distal augmentation maneuvers. COMPARISON:  None. FINDINGS: Contralateral Subclavian Vein: Respiratory phasicity is normal and symmetric with the symptomatic side. No evidence of thrombus. Normal compressibility. Internal Jugular Vein: No evidence of thrombus. Normal compressibility, respiratory phasicity and response to augmentation. Subclavian Vein: No evidence of  thrombus. Normal compressibility, respiratory phasicity and response to augmentation. Axillary Vein: No evidence of thrombus. Normal compressibility, respiratory phasicity and response to augmentation. Cephalic Vein: Acute expansile near occlusive superficial thrombophlebitis of the distal left cephalic vein in the region of the antecubital fossa at a former IV site. Proximal and mid left cephalic vein are patent and compressible. Basilic Vein: No evidence of thrombus. Normal compressibility, respiratory phasicity and response to augmentation. Brachial Veins: No evidence of thrombus. Normal compressibility, respiratory phasicity and response to augmentation. Radial Veins: No evidence of thrombus. Normal compressibility, respiratory phasicity and response to augmentation. Ulnar Veins: No evidence of thrombus. Normal compressibility, respiratory phasicity and response to augmentation. Venous Reflux:  None visualized. Other Findings:  None visualized. IMPRESSION: No evidence of deep venous thrombosis within the left upper extremity. Acute near occlusive superficial thrombophlebitis of the distal left cephalic vein in the region of the antecubital fossa. Electronically Signed   By: Ilona Sorrel M.D.   On: 04/17/2021 11:20   DG Chest Portable 1 View  Result Date: 04/17/2021 CLINICAL DATA:  Status post central line placement EXAM: PORTABLE CHEST 1 VIEW COMPARISON:  Film from earlier in the same day. FINDINGS: New left jugular central line is noted with the catheter tip at the cavoatrial junction. No pneumothorax is noted. Cardiac shadow is stable. Postsurgical changes are again noted. Patchy airspace disease is again noted in the right mid lung. No new focal abnormality is noted. IMPRESSION: No pneumothorax following central line placement on the left. Electronically Signed   By: Inez Catalina M.D.   On: 04/17/2021 21:53   DG Chest Portable 1 View  Result Date: 04/17/2021 CLINICAL DATA:  Sepsis.  Hypoxia. EXAM:  PORTABLE CHEST 1 VIEW COMPARISON:  08/17/2020 FINDINGS: Stable mild cardiomegaly. Prior CABG again noted. New ill-defined airspace opacity is seen in the right midlung. Left lung is clear. No evidence of pleural effusion. IMPRESSION: New ill-defined airspace opacity in right midlung. Electronically Signed   By: Marlaine Hind M.D.   On: 04/17/2021 11:47    Review of Systems  Unable to perform ROS: Mental status change  Musculoskeletal:  Negative for neck pain.  Blood pressure 122/66, pulse (!) 108, temperature 98.4 F (36.9 C), temperature source Axillary, resp. rate 20, SpO2 96 %. Physical Exam Constitutional:      Comments: Awake and in no acute distress  Cardiovascular:     Rate and Rhythm: Tachycardia present. Rhythm irregular.  Pulmonary:     Effort: Pulmonary effort is  normal. No respiratory distress.     Breath sounds: Normal breath sounds.  Abdominal:     General: There is no distension.     Palpations: Abdomen is soft.     Comments: Wound clean and dry  Musculoskeletal:     Cervical back: Neck supple.     Comments: Swelling upper mid sternum with fluctuance and mild erythema  Lymphadenopathy:     Cervical: No cervical adenopathy.  Neurological:     Comments: Moves all 4 spontaneously.  Does respond to questions but not always appropriately.  Psychiatric:     Comments: Confused.  Nonsensical speech.    Assessment/Plan: Donevin [name] is an 85 year old man with a history of hypertension, hyperlipidemia, prostate cancer CAD, CABG, melanoma, dementia, and multiple falls.    He was recently admitted to [facility] after a fall with loss of consciousness.  He was discharged home and within 24 hours and got back in the emergency room at [facility] with sepsis with MRSA bacteremia and atrial fibrillation with RVR.  Among the findings are a sternal fracture just below the manubrium with surrounding phlegmon.  Likely a sternal fracture from his recent fall that is secondarily infected.  Also has  possible septic emboli to the lung with cavitary lesion on the left and and the suspicious lesion on the right as well.  Also question of T12 vertebral body osteomyelitis.  Unfortunately, his family is not around currently.  He is not able to converse or make medical decisions.  I tried calling his wife on the telephone and left a message for her to call the hospital.  Hopefully she will arrive relatively soon.  Initial order of business is to get him stabilized medically with IV fluids, intravenous antibiotics, and control of atrial fibrillation.  If the family wishes to be aggressive, he will likely need I&D of the sternum and anterior mediastinum with VAC placement.  Melrose Nakayama 04/19/2021, 11:43 AM

## 2021-04-18 NOTE — Progress Notes (Signed)
Pharmacy Antibiotic Note  [name] is a 85 y.o. male admitted on 04/19/2021 with sepsis. Pt was discharged from [facility] on 11/4 and transferred to [facility] from [facility] 11/6. Pharmacy has been consulted for vancomycin and Zosyn dosing.  WBC 10.8 > 11.6, afebrile   Plan: Zosyn 3.375g IV q8h (4 hour infusion). Pt only received 750 mg IV x1 from planned 2 g loading dose (per nurse, pt lost IV access). Will start maintenance dose of 1750 mg IV q24h Est AUC: 488.9 Used: Scr 1.07, Vd 0.72, IBW Obtain vanc levels around 4th or 5th dose  Monitor renal function and adjust dose as clinically indicated F/u LOT     Temp (24hrs), Avg:98.4 F (36.9 C), Min:98 F (36.7 C), Max:98.8 F (37.1 C)  Recent Labs  Lab 04/17/21 0856 04/17/21 0947 04/17/21 1158 05/09/2021 0539  WBC 10.8*  --   --  11.6*  CREATININE 1.07  --   --  0.92  LATICACIDVEN 2.6* 2.9* 1.6 1.6     Estimated Creatinine Clearance: 66.8 mL/min (by C-G formula based on SCr of 0.92 mg/dL).    Allergies  Allergen Reactions   Codeine Nausea And Vomiting   Indocin [Indomethacin] Other (See Comments)   Latex Other (See Comments)    Blisters   Mupirocin     Blisters   Plavix [Clopidogrel Bisulfate]     GI intolerance, n/v   Polysporin [Bacitracin-Polymyxin B]     Blisters   Shellfish Allergy Nausea And Vomiting    Mussels   Tape    Neosporin [Neomycin-Bacitracin Zn-Polymyx] Rash   Other Nausea And Vomiting    mussels    Antimicrobials this admission: 11/5 vancomycin >>  11/5 Zosyn >>   Dose adjustments this admission: none  Microbiology results: 11/5 BCx: 4/4 staph aureus w/ MecA/C and MREJ resistance  11/5 UCx: ip   Thank you for allowing pharmacy to be a part of this patient's care.  Laurey Arrow, PharmD PGY1 Pharmacy Resident 05/02/2021  9:56 AM  Please check [email or web address] for unit-specific pharmacy phone numbers.

## 2021-04-18 NOTE — ED Notes (Signed)
Patient not alert to sign consent for transfer.

## 2021-04-18 NOTE — Plan of Care (Signed)
POC initiated and progressing. 

## 2021-04-18 NOTE — Consult Note (Signed)
[facility]  Total days of antibiotics 2/ piptazo and vanco               Reason for Consult: sepsis     Referring Physician: yates  Principal Problem:   Sepsis due to methicillin resistant Staphylococcus aureus (MRSA) ([place]) Active Problems:   History of prostate cancer   Essential hypertension   CAD (coronary artery disease)   Hyperlipidemia, mixed   Lumbar compression fracture (HCC)   Atrial fibrillation with RVR (HCC)   Sternal wound infection   SDH (subdural hematoma)   Pneumonia involving right lung   Delirium   DNR (do not resuscitate)    HPI: [name] is a 85 y.o. 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 he recently admitted at OSH from 10/29-11/4 for AMS/LOC from fall where he was found to have acute SDH, medical managed and received staples for scalp wound. He was discharged to home with 24/7 care. He was shortly readmitted to the hospital on 11/5 for fever, of 100.52F, and new onset respiratory distress with afib with RVR. He underwent CT imaging to rule PE but found large 5.6 x 3 cm anterior mediastinal fluid collection with gass. With fracture of sternum, in addition to pulmon nodules concerning for septi emboli. Patient was started on vancomycin and piptazo, for empiric coverage and transferred from [facility] to [facility] for further evaluation. His infectious work up identified MRSA bacteremia by [employer]. Labs revealed leukocytosis of 11K with left shift. He is more alert in the first 24hrs of admission but still unable to articulate why he is back in the hospital.  Past Medical History:  Diagnosis Date   Arthritis    Benign essential tremor    Fracture, femur ([place]) 2012   GERD (gastroesophageal reflux disease)    H/O echocardiogram    05/2013   Hx of colonoscopy     04/22/2013   Hypercholesterolemia    Hypertension    Myocardial infarction [facility])    1987   Polyneuropathy    Prostate cancer ([place]) 2004   Superficial hematoma     Allergies:  Allergies  Allergen Reactions   Codeine Nausea And Vomiting   Indocin [Indomethacin] Other (See Comments)   Latex Other (See Comments)    Blisters   Mupirocin     Blisters   Plavix [Clopidogrel Bisulfate]     GI intolerance, n/v   Polysporin [Bacitracin-Polymyxin B]     Blisters   Shellfish Allergy Nausea And Vomiting    Mussels   Tape    Neosporin [Neomycin-Bacitracin Zn-Polymyx] Rash   Other Nausea And Vomiting    mussels    MEDICATIONS:  azelastine  1 spray Each Nare BID   docusate sodium  100 mg Oral BID   sodium chloride flush  3 mL Intravenous Q12H    Social History   Tobacco Use   Smoking status: Never   Smokeless tobacco: Never  Vaping Use   Vaping Use: Never used  Substance Use Topics   Alcohol use: Yes    Alcohol/week: 6.0 standard drinks    Types: 6 Glasses of wine per week    Comment: wine in the evening with dinner   Drug use: No    Family History  Problem Relation Age of Onset   Heart disease Mother  Congestive Heart Failure Father     Review of Systems -  Unable to obtain due encephalopathy  OBJECTIVE: Temp:  [98 F (36.7 C)-98.8 F (37.1 C)] 98.4 F (36.9 C) (11/06 1107) Pulse Rate:  [69-134] 108 (11/06 1107) Resp:  [14-28] 20 (11/06 1107) BP: (105-146)/(63-98) 122/66 (11/06 1107) SpO2:  [90 %-100 %] 96 % (11/06 1107) FiO2 (%):  [21 %] 21 % (11/06 0910) Weight:  [86.7 kg] 86.7 kg (11/06 0536) Physical Exam  Constitutional: He is oriented to person, only. He appears stated age, well-developed and well-nourished. No distress.  HENT:  Mouth/Throat: Oropharynx is clear and moist. No oropharyngeal exudate.  Cardiovascular: Normal rate, regular rhythm and normal heart sounds. Exam reveals no gallop and no friction rub.  No murmur heard.  Pulmonary/Chest:  Effort normal and breath sounds normal. No respiratory distress. He has no wheezes.  Abdominal: Soft. Bowel sounds are normal. He exhibits no distension. There is no tenderness.  Gu= foley catheter in place Neurological: He is alert and oriented to person, place, and time.  Skin: Skin is warm and dry. No rash noted. No erythema.  Psychiatric: He has a normal mood and affect. His behavior is normal.    LABS: Results for orders placed or performed during the hospital encounter of 04/19/2021 (from the past 48 hour(s))  Comprehensive metabolic panel     Status: Abnormal   Collection Time: 04/19/2021  5:39 AM  Result Value Ref Range   Sodium 132 (L) 135 - 145 mmol/L   Potassium 3.8 3.5 - 5.1 mmol/L   Chloride 100 98 - 111 mmol/L   CO2 19 (L) 22 - 32 mmol/L   Glucose, Bld 131 (H) 70 - 99 mg/dL    Comment: Glucose reference range applies only to samples taken after fasting for at least 8 hours.   BUN 23 8 - 23 mg/dL   Creatinine, Ser 0.92 0.61 - 1.24 mg/dL   Calcium 8.9 8.9 - 10.3 mg/dL   Total Protein 6.0 (L) 6.5 - 8.1 g/dL   Albumin 2.0 (L) 3.5 - 5.0 g/dL   AST 43 (H) 15 - 41 U/L   ALT 24 0 - 44 U/L   Alkaline Phosphatase 54 38 - 126 U/L   Total Bilirubin 1.3 (H) 0.3 - 1.2 mg/dL   GFR, Estimated >60 >60 mL/min    Comment: (NOTE) Calculated using the CKD-EPI Creatinine Equation (2021)    Anion gap 13 5 - 15    Comment: Performed at [facility], [place] [address] [place], [place] [address]  CBC with Differential/Platelet     Status: Abnormal   Collection Time: 04/23/2021  5:39 AM  Result Value Ref Range   WBC 11.6 (H) 4.0 - 10.5 K/uL   RBC 2.56 (L) 4.22 - 5.81 MIL/uL   Hemoglobin 8.9 (L) 13.0 - 17.0 g/dL   HCT 27.3 (L) 39.0 - 52.0 %   MCV 106.6 (H) 80.0 - 100.0 fL   MCH 34.8 (H) 26.0 - 34.0 pg   MCHC 32.6 30.0 - 36.0 g/dL   RDW 14.0 11.5 - 15.5 %   Platelets 296 150 - 400 K/uL   nRBC 0.0 0.0 - 0.2 %   Neutrophils Relative % 88 %   Neutro Abs 10.2 (H) 1.7 - 7.7 K/uL    Lymphocytes Relative 7 %   Lymphs Abs 0.8 0.7 - 4.0 K/uL   Monocytes Relative 5 %   Monocytes Absolute 0.6 0.1 - 1.0 K/uL   Eosinophils Relative 0 %  Eosinophils Absolute 0.0 0.0 - 0.5 K/uL   Basophils Relative 0 %   Basophils Absolute 0.0 0.0 - 0.1 K/uL   nRBC 0 0 /100 WBC   Abs Immature Granulocytes 0.00 0.00 - 0.07 K/uL   Burr Cells PRESENT    Polychromasia PRESENT     Comment: Performed at [facility], [place] [address] [place], [place] [address]  Type and screen [place]     Status: None   Collection Time: 04/23/2021  5:39 AM  Result Value Ref Range   ABO/RH(D) AB POS    Antibody Screen NEG    Sample Expiration      04/21/2021,2359 Performed at [facility], [place] [address], [place], [place] [address]   Troponin I (High Sensitivity)     Status: Abnormal   Collection Time: 04/17/2021  5:39 AM  Result Value Ref Range   Troponin I (High Sensitivity) 24 (H) <18 ng/L    Comment: (NOTE) Elevated high sensitivity troponin I (hsTnI) values and significant  changes across serial measurements may suggest ACS but many other  chronic and acute conditions are known to elevate hsTnI results.  Refer to the "Links" section for chest pain algorithms and additional  guidance. Performed at [facility], [place] [address], [place], [place] [address]   Lactic acid, plasma     Status: None   Collection Time: 04/17/2021  5:39 AM  Result Value Ref Range   Lactic Acid, Venous 1.6 0.5 - 1.9 mmol/L    Comment: Performed at [facility] [address], [place], [place] [address]  ABO/Rh     Status: None   Collection Time: 05/08/2021  6:37 AM  Result Value Ref Range   ABO/RH(D)      AB POS Performed at [facility] [address], [place], [place] [address]   Troponin I (High Sensitivity)     Status: Abnormal   Collection Time: 04/19/2021  7:37 AM  Result Value Ref Range   Troponin I (High Sensitivity) 24 (H) <18 ng/L    Comment: (NOTE) Elevated high  sensitivity troponin I (hsTnI) values and significant  changes across serial measurements may suggest ACS but many other  chronic and acute conditions are known to elevate hsTnI results.  Refer to the "Links" section for chest pain algorithms and additional  guidance. Performed at [facility], [place] [address], [place], [place] [address]   Procalcitonin     Status: None   Collection Time: 04/24/2021  7:37 AM  Result Value Ref Range   Procalcitonin 3.69 ng/mL    Comment:        Interpretation: PCT > 2 ng/mL: Systemic infection (sepsis) is likely, unless other causes are known. (NOTE)       Sepsis PCT Algorithm           Lower Respiratory Tract                                      Infection PCT Algorithm    ----------------------------     ----------------------------         PCT < 0.25 ng/mL                PCT < 0.10 ng/mL          Strongly encourage  Strongly discourage   discontinuation of antibiotics    initiation of antibiotics    ----------------------------     -----------------------------       PCT 0.25 - 0.50 ng/mL            PCT 0.10 - 0.25 ng/mL               OR       >80% decrease in PCT            Discourage initiation of                                            antibiotics      Encourage discontinuation           of antibiotics    ----------------------------     -----------------------------         PCT >= 0.50 ng/mL              PCT 0.26 - 0.50 ng/mL               AND       <80% decrease in PCT              Encourage initiation of                                             antibiotics       Encourage continuation           of antibiotics    ----------------------------     -----------------------------        PCT >= 0.50 ng/mL                  PCT > 0.50 ng/mL               AND         increase in PCT                  Strongly encourage                                      initiation of antibiotics    Strongly encourage escalation            of antibiotics                                     -----------------------------                                           PCT <= 0.25 ng/mL                                                 OR                                        >  80% decrease in PCT                                      Discontinue / Do not initiate                                             antibiotics  Performed at [facility], [place] [address], [place], [place] [address]     MICRO: Blood cx 11/5 MRSA IMAGING: CT HEAD WO CONTRAST (5MM)  Result Date: 04/17/2021 CLINICAL DATA:  Mental status change, unknown cause. Sepsis. Recent fall. EXAM: CT HEAD WITHOUT CONTRAST TECHNIQUE: Contiguous axial images were obtained from the base of the skull through the vertex without intravenous contrast. COMPARISON:  Report of 04/10/2021 head CT from [facility] FINDINGS: Brain: A small mixed density subdural hematoma over the left cerebral convexity measures up to 5 mm in thickness (4 mm thickness reported on the recent outside CT). A small subdural fluid collection over the right cerebral convexity measures up to 1.3 cm in thickness in the frontal region and is diffusely hypoattenuating with density similar to only slightly greater than that of CSF; this was not described in the recent outside report. There is associated right frontal gyral flattening without midline shift or other significant mass effect. Hypodensities in the cerebral white matter bilaterally are nonspecific but compatible with mild-to-moderate chronic small vessel ischemic disease. There is mild cerebral atrophy. No acute infarct, parenchymal hemorrhage, subarachnoid hemorrhage, or mass is identified. Vascular: Calcified atherosclerosis at the skull base. No hyperdense vessel. Skull: No fracture or suspicious osseous lesion. Sinuses/Orbits: Mild mucosal thickening or small volume fluid in the left sphenoid sinus. Visualized mastoid air cells are clear. Unremarkable orbits.  Other: Mild left occipital scalp swelling with skin staples in place. IMPRESSION: 1. Small mixed density subdural hematoma over the left cerebral convexity, similar in size to that described on the recent outside CT. 2. Small low-density subdural collection in the right frontal region suggestive of a subdural hygroma and potentially new. No midline shift. 3. Mild-to-moderate chronic small vessel ischemic disease. Electronically Signed   By: Logan Bores M.D.   On: 04/17/2021 11:38   CT Angio Chest PE W and/or Wo Contrast  Result Date: 04/17/2021 CLINICAL DATA:  PE suspected. Sepsis. EXAM: CT ANGIOGRAPHY CHEST CT ABDOMEN AND PELVIS WITH CONTRAST TECHNIQUE: Multidetector CT imaging of the chest was performed using the standard protocol during bolus administration of intravenous contrast. Multiplanar CT image reconstructions and MIPs were obtained to evaluate the vascular anatomy. Multidetector CT imaging of the abdomen and pelvis was performed using the standard protocol during bolus administration of intravenous contrast. CONTRAST:  155mL OMNIPAQUE IOHEXOL 350 MG/ML SOLN COMPARISON:  None. FINDINGS: CTA CHEST FINDINGS Cardiovascular: Suboptimal visualization of the segmental pulmonary arteries in the lower thorax due to streak artifact. Accounting for this, no evidence of pulmonary embolus. Prior CABG. Normal heart size. Minimal pericardial effusion. Mediastinum/Nodes: No enlarged mediastinal, hilar, or axillary lymph nodes. Thyroid gland is normal. There is polypoid soft tissue density opacification along the right hand side of the proximal trachea. The main bronchi are patent. There is a high density gas and fluid collection in the anterior mediastinum, posterior to the sternum measuring 5.6 x 3.1 cm. Curvilinear right opacification within this collection suggests possible active extravasation.  There is an associated gas and fluid collection anterior to the sternum, at the level of the third median sternotomy  wire. There is a transverse fracture of the body of the sternum, likely acute and causing the above-mentioned collections. Lungs/Pleura: There are small bilateral pleural effusions with bibasilar atelectasis. Peripheral nodular opacities are seen in the left upper lobe and right upper lobe with prominent cavitation within the left upper lobe opacity. The findings are suggestive of septic emboli. Small nodular opacities are seen in the left lung apex. Musculoskeletal: Transverse fracture of the manubrium. Age indeterminate compression fracture of T7 vertebral body with approximately 30% height loss. Review of the MIP images confirms the above findings. CT ABDOMEN and PELVIS FINDINGS Hepatobiliary: No focal liver abnormality is seen. No gallstones, gallbladder wall thickening, or biliary dilatation. Pancreas: Unremarkable. No pancreatic ductal dilatation or surrounding inflammatory changes. Spleen: Normal in size without focal abnormality. Adrenals/Urinary Tract: Adrenal glands are unremarkable. Kidneys are normal, without renal calculi, focal lesion, or hydronephrosis. Bladder is decompressed around urinary Foley. Diffuse irregular urinary bladder wall thickening. Stomach/Bowel: Moderate hiatal hernia. No evidence of small-bowel obstruction. Large amount of formed stool throughout the colon. Vascular/Lymphatic: Aortic atherosclerosis. No grossly enlarged abdominal or pelvic lymph nodes. Shotty retroperitoneal lymph nodes. Reproductive: Prior prostatectomy. Other: Fat containing periumbilical anterior abdominal wall hernia. Musculoskeletal: Age indeterminate severe compression fracture of L1 vertebral body with approximately 80% height loss, and 9 mm retropulsion. Age-indeterminate transverse fracture of T12 vertebral body, with approximately 10% height loss. 2-3 mm of retropulsion to the spinal canal. Soft tissue and small amount of gas are seen surrounding the fracture line. The effect of the soft tissue on the  spinal cord cannot be well evaluated. Review of the MIP images confirms the above findings. IMPRESSION: 1. Suboptimal visualization of the segmental pulmonary arteries in the lower thorax due to streak artifact. Accounting for this, no evidence of pulmonary embolus. 2. 5.6 x 3.1 cm high density gas and fluid collection in the anterior mediastinum, posterior to the sternum. Curvilinear bright opacification within this collection suggests possible active extravasation. 3. Transverse fracture of the body of the sternum, likely acute and causing the above-mentioned collections. 4 mm posterior displacement of the superior fracture line. 4. Small bilateral pleural effusions with bibasilar atelectasis. 5. Peripheral nodular opacities in the left upper lobe and right upper lobe with prominent cavitation within the left upper lobe opacity. The findings are suggestive of septic emboli. 6. Polypoid soft tissue density opacification along the hand side of the proximal trachea. This may represent adherent secretions, however, endobronchial lesion cannot be excluded. 7. Age indeterminate compression fractures of T7, L1 and T12 vertebral bodies. 9 mm retropulsion at the L1 fracture line. Indistinct fracture line and soft tissue surrounding the T12 vertebral body fracture raises the question about possible pathologic fracture with questionable osteomyelitis. The effect on the spinal cord from these fractures cannot be well evaluated. If there is clinical concern for neurologic symptoms, consider evaluation with contrast enhanced MRI. 8. Large amount of formed stool throughout the colon, query constipation. 9. Diffuse irregular thickening of the urinary bladder wall, not well visualized due to its decompressed state. 10. Fat containing periumbilical anterior abdominal wall hernia. 11. Prior prostatectomy. 12. Aortic atherosclerosis. Aortic Atherosclerosis (ICD10-I70.0). These results were called by telephone at the time of  interpretation on 04/17/2021 at 1:00 pm to provider [facility] , who verbally acknowledged these results. Electronically Signed   By: Fidela Salisbury M.D.   On: 04/17/2021 13:14  CT ABDOMEN PELVIS W CONTRAST  Result Date: 04/17/2021 CLINICAL DATA:  PE suspected. Sepsis. EXAM: CT ANGIOGRAPHY CHEST CT ABDOMEN AND PELVIS WITH CONTRAST TECHNIQUE: Multidetector CT imaging of the chest was performed using the standard protocol during bolus administration of intravenous contrast. Multiplanar CT image reconstructions and MIPs were obtained to evaluate the vascular anatomy. Multidetector CT imaging of the abdomen and pelvis was performed using the standard protocol during bolus administration of intravenous contrast. CONTRAST:  137mL OMNIPAQUE IOHEXOL 350 MG/ML SOLN COMPARISON:  None. FINDINGS: CTA CHEST FINDINGS Cardiovascular: Suboptimal visualization of the segmental pulmonary arteries in the lower thorax due to streak artifact. Accounting for this, no evidence of pulmonary embolus. Prior CABG. Normal heart size. Minimal pericardial effusion. Mediastinum/Nodes: No enlarged mediastinal, hilar, or axillary lymph nodes. Thyroid gland is normal. There is polypoid soft tissue density opacification along the right hand side of the proximal trachea. The main bronchi are patent. There is a high density gas and fluid collection in the anterior mediastinum, posterior to the sternum measuring 5.6 x 3.1 cm. Curvilinear right opacification within this collection suggests possible active extravasation. There is an associated gas and fluid collection anterior to the sternum, at the level of the third median sternotomy wire. There is a transverse fracture of the body of the sternum, likely acute and causing the above-mentioned collections. Lungs/Pleura: There are small bilateral pleural effusions with bibasilar atelectasis. Peripheral nodular opacities are seen in the left upper lobe and right upper lobe with prominent  cavitation within the left upper lobe opacity. The findings are suggestive of septic emboli. Small nodular opacities are seen in the left lung apex. Musculoskeletal: Transverse fracture of the manubrium. Age indeterminate compression fracture of T7 vertebral body with approximately 30% height loss. Review of the MIP images confirms the above findings. CT ABDOMEN and PELVIS FINDINGS Hepatobiliary: No focal liver abnormality is seen. No gallstones, gallbladder wall thickening, or biliary dilatation. Pancreas: Unremarkable. No pancreatic ductal dilatation or surrounding inflammatory changes. Spleen: Normal in size without focal abnormality. Adrenals/Urinary Tract: Adrenal glands are unremarkable. Kidneys are normal, without renal calculi, focal lesion, or hydronephrosis. Bladder is decompressed around urinary Foley. Diffuse irregular urinary bladder wall thickening. Stomach/Bowel: Moderate hiatal hernia. No evidence of small-bowel obstruction. Large amount of formed stool throughout the colon. Vascular/Lymphatic: Aortic atherosclerosis. No grossly enlarged abdominal or pelvic lymph nodes. Shotty retroperitoneal lymph nodes. Reproductive: Prior prostatectomy. Other: Fat containing periumbilical anterior abdominal wall hernia. Musculoskeletal: Age indeterminate severe compression fracture of L1 vertebral body with approximately 80% height loss, and 9 mm retropulsion. Age-indeterminate transverse fracture of T12 vertebral body, with approximately 10% height loss. 2-3 mm of retropulsion to the spinal canal. Soft tissue and small amount of gas are seen surrounding the fracture line. The effect of the soft tissue on the spinal cord cannot be well evaluated. Review of the MIP images confirms the above findings. IMPRESSION: 1. Suboptimal visualization of the segmental pulmonary arteries in the lower thorax due to streak artifact. Accounting for this, no evidence of pulmonary embolus. 2. 5.6 x 3.1 cm high density gas and fluid  collection in the anterior mediastinum, posterior to the sternum. Curvilinear bright opacification within this collection suggests possible active extravasation. 3. Transverse fracture of the body of the sternum, likely acute and causing the above-mentioned collections. 4 mm posterior displacement of the superior fracture line. 4. Small bilateral pleural effusions with bibasilar atelectasis. 5. Peripheral nodular opacities in the left upper lobe and right upper lobe with prominent cavitation within the left upper lobe  opacity. The findings are suggestive of septic emboli. 6. Polypoid soft tissue density opacification along the hand side of the proximal trachea. This may represent adherent secretions, however, endobronchial lesion cannot be excluded. 7. Age indeterminate compression fractures of T7, L1 and T12 vertebral bodies. 9 mm retropulsion at the L1 fracture line. Indistinct fracture line and soft tissue surrounding the T12 vertebral body fracture raises the question about possible pathologic fracture with questionable osteomyelitis. The effect on the spinal cord from these fractures cannot be well evaluated. If there is clinical concern for neurologic symptoms, consider evaluation with contrast enhanced MRI. 8. Large amount of formed stool throughout the colon, query constipation. 9. Diffuse irregular thickening of the urinary bladder wall, not well visualized due to its decompressed state. 10. Fat containing periumbilical anterior abdominal wall hernia. 11. Prior prostatectomy. 12. Aortic atherosclerosis. Aortic Atherosclerosis (ICD10-I70.0). These results were called by telephone at the time of interpretation on 04/17/2021 at 1:00 pm to provider [facility] , who verbally acknowledged these results. Electronically Signed   By: Fidela Salisbury M.D.   On: 04/17/2021 13:14   US Venous Img Upper Uni Left (DVT)  Result Date: 04/17/2021 CLINICAL DATA:  Left upper extremity swelling EXAM: LEFT UPPER  EXTREMITY VENOUS DOPPLER ULTRASOUND TECHNIQUE: Gray-scale sonography with graded compression, as well as color Doppler and duplex ultrasound were performed to evaluate the upper extremity deep venous system from the level of the subclavian vein and including the jugular, axillary, basilic, radial, ulnar and upper cephalic vein. Spectral Doppler was utilized to evaluate flow at rest and with distal augmentation maneuvers. COMPARISON:  None. FINDINGS: Contralateral Subclavian Vein: Respiratory phasicity is normal and symmetric with the symptomatic side. No evidence of thrombus. Normal compressibility. Internal Jugular Vein: No evidence of thrombus. Normal compressibility, respiratory phasicity and response to augmentation. Subclavian Vein: No evidence of thrombus. Normal compressibility, respiratory phasicity and response to augmentation. Axillary Vein: No evidence of thrombus. Normal compressibility, respiratory phasicity and response to augmentation. Cephalic Vein: Acute expansile near occlusive superficial thrombophlebitis of the distal left cephalic vein in the region of the antecubital fossa at a former IV site. Proximal and mid left cephalic vein are patent and compressible. Basilic Vein: No evidence of thrombus. Normal compressibility, respiratory phasicity and response to augmentation. Brachial Veins: No evidence of thrombus. Normal compressibility, respiratory phasicity and response to augmentation. Radial Veins: No evidence of thrombus. Normal compressibility, respiratory phasicity and response to augmentation. Ulnar Veins: No evidence of thrombus. Normal compressibility, respiratory phasicity and response to augmentation. Venous Reflux:  None visualized. Other Findings:  None visualized. IMPRESSION: No evidence of deep venous thrombosis within the left upper extremity. Acute near occlusive superficial thrombophlebitis of the distal left cephalic vein in the region of the antecubital fossa. Electronically  Signed   By: Ilona Sorrel M.D.   On: 04/17/2021 11:20   DG Chest Portable 1 View  Result Date: 04/17/2021 CLINICAL DATA:  Status post central line placement EXAM: PORTABLE CHEST 1 VIEW COMPARISON:  Film from earlier in the same day. FINDINGS: New left jugular central line is noted with the catheter tip at the cavoatrial junction. No pneumothorax is noted. Cardiac shadow is stable. Postsurgical changes are again noted. Patchy airspace disease is again noted in the right mid lung. No new focal abnormality is noted. IMPRESSION: No pneumothorax following central line placement on the left. Electronically Signed   By: Inez Catalina M.D.   On: 04/17/2021 21:53   DG Chest Portable 1 View  Result Date: 04/17/2021 CLINICAL  DATA:  Sepsis.  Hypoxia. EXAM: PORTABLE CHEST 1 VIEW COMPARISON:  08/17/2020 FINDINGS: Stable mild cardiomegaly. Prior CABG again noted. New ill-defined airspace opacity is seen in the right midlung. Left lung is clear. No evidence of pleural effusion. IMPRESSION: New ill-defined airspace opacity in right midlung. Electronically Signed   By: Marlaine Hind M.D.   On: 04/17/2021 11:47    HISTORICAL MICRO/IMAGING  Assessment/Plan:  85yo M with recent fall with SDH, and sternal fracture that now has surrounding phlegmon that could be secondarily infected since he has MRSA bacteremia and possible endocarditis due to possible pulmonary septic emboli. His advanced age, and co-morbidities make him a poor surgical candidate. Patient still encephalopathic from underlying infection for any decision making at this time.  Recommend to continue with vancomycin  Discontinue piptazo Start with TTE.  Will need to discuss further work-up and treatment goals with family  Caren Griffins B. [facility] [phone]

## 2021-04-19 ENCOUNTER — Inpatient Hospital Stay (HOSPITAL_COMMUNITY): Payer: Medicare Other

## 2021-04-19 DIAGNOSIS — R0609 Other forms of dyspnea: Secondary | ICD-10-CM

## 2021-04-19 DIAGNOSIS — S21101A Unspecified open wound of right front wall of thorax without penetration into thoracic cavity, initial encounter: Secondary | ICD-10-CM

## 2021-04-19 DIAGNOSIS — I4891 Unspecified atrial fibrillation: Secondary | ICD-10-CM | POA: Diagnosis not present

## 2021-04-19 DIAGNOSIS — R410 Disorientation, unspecified: Secondary | ICD-10-CM | POA: Diagnosis not present

## 2021-04-19 DIAGNOSIS — S065XAA Traumatic subdural hemorrhage with loss of consciousness status unknown, initial encounter: Secondary | ICD-10-CM

## 2021-04-19 DIAGNOSIS — A4102 Sepsis due to Methicillin resistant Staphylococcus aureus: Secondary | ICD-10-CM | POA: Diagnosis not present

## 2021-04-19 DIAGNOSIS — M4624 Osteomyelitis of vertebra, thoracic region: Secondary | ICD-10-CM | POA: Diagnosis not present

## 2021-04-19 DIAGNOSIS — R7881 Bacteremia: Secondary | ICD-10-CM | POA: Diagnosis not present

## 2021-04-19 DIAGNOSIS — T8130XA Disruption of wound, unspecified, initial encounter: Secondary | ICD-10-CM

## 2021-04-19 DIAGNOSIS — Z7189 Other specified counseling: Secondary | ICD-10-CM | POA: Diagnosis not present

## 2021-04-19 DIAGNOSIS — L089 Local infection of the skin and subcutaneous tissue, unspecified: Secondary | ICD-10-CM

## 2021-04-19 LAB — BASIC METABOLIC PANEL
Anion gap: 10 (ref 5–15)
BUN: 20 mg/dL (ref 8–23)
CO2: 21 mmol/L — ABNORMAL LOW (ref 22–32)
Calcium: 9 mg/dL (ref 8.9–10.3)
Chloride: 104 mmol/L (ref 98–111)
Creatinine, Ser: 0.86 mg/dL (ref 0.61–1.24)
GFR, Estimated: >60 mL/min (ref >60)
Glucose, Bld: 129 mg/dL — ABNORMAL HIGH (ref 70–99)
Potassium: 2.6 mmol/L — CL (ref 3.5–5.1)
Sodium: 135 mmol/L (ref 135–145)

## 2021-04-19 LAB — URINE CULTURE

## 2021-04-19 LAB — URINALYSIS, ROUTINE W REFLEX MICROSCOPIC
Bilirubin Urine: NEGATIVE
Glucose, UA: NEGATIVE mg/dL
Ketones, ur: 5 mg/dL — AB
Nitrite: NEGATIVE
Protein, ur: NEGATIVE mg/dL
Specific Gravity, Urine: 1.014 (ref 1.005–1.030)
pH: 5 (ref 5.0–8.0)

## 2021-04-19 LAB — ECHOCARDIOGRAM COMPLETE
Height: 72 in
S' Lateral: 3.07 cm
Weight: 3058.22 oz

## 2021-04-19 LAB — BLOOD GAS, ARTERIAL
Acid-base deficit: 3.4 mmol/L — ABNORMAL HIGH (ref 0.0–2.0)
Bicarbonate: 19.9 mmol/L — ABNORMAL LOW (ref 20.0–28.0)
FIO2: 21
O2 Saturation: 95.6 %
Patient temperature: 37.6
pCO2 arterial: 29.9 mmHg — ABNORMAL LOW (ref 32.0–48.0)
pH, Arterial: 7.443 (ref 7.350–7.450)
pO2, Arterial: 81.4 mmHg — ABNORMAL LOW (ref 83.0–108.0)

## 2021-04-19 LAB — CBC
HCT: 23 % — ABNORMAL LOW (ref 39.0–52.0)
HCT: 24 % — ABNORMAL LOW (ref 39.0–52.0)
Hemoglobin: 8 g/dL — ABNORMAL LOW (ref 13.0–17.0)
Hemoglobin: 8.2 g/dL — ABNORMAL LOW (ref 13.0–17.0)
MCH: 34 pg (ref 26.0–34.0)
MCH: 33.9 pg (ref 26.0–34.0)
MCHC: 34.8 g/dL (ref 30.0–36.0)
MCHC: 34.2 g/dL (ref 30.0–36.0)
MCV: 97.9 fL (ref 80.0–100.0)
MCV: 99.2 fL (ref 80.0–100.0)
Platelets: 352 10*3/uL (ref 150–400)
Platelets: 447 10*3/uL — ABNORMAL HIGH (ref 150–400)
RBC: 2.35 MIL/uL — ABNORMAL LOW (ref 4.22–5.81)
RBC: 2.42 MIL/uL — ABNORMAL LOW (ref 4.22–5.81)
RDW: 13.6 % (ref 11.5–15.5)
RDW: 14 % (ref 11.5–15.5)
WBC: 10.8 10*3/uL — ABNORMAL HIGH (ref 4.0–10.5)
WBC: 14.4 10*3/uL — ABNORMAL HIGH (ref 4.0–10.5)
nRBC: 0 % (ref 0.0–0.2)
nRBC: 0.1 % (ref 0.0–0.2)

## 2021-04-19 LAB — COMPREHENSIVE METABOLIC PANEL
ALT: 94 U/L — ABNORMAL HIGH (ref 0–44)
AST: 182 U/L — ABNORMAL HIGH (ref 15–41)
Albumin: 1.8 g/dL — ABNORMAL LOW (ref 3.5–5.0)
Alkaline Phosphatase: 107 U/L (ref 38–126)
Anion gap: 12 (ref 5–15)
BUN: 19 mg/dL (ref 8–23)
CO2: 20 mmol/L — ABNORMAL LOW (ref 22–32)
Calcium: 9.6 mg/dL (ref 8.9–10.3)
Chloride: 106 mmol/L (ref 98–111)
Creatinine, Ser: 0.94 mg/dL (ref 0.61–1.24)
GFR, Estimated: >60 mL/min (ref >60)
Glucose, Bld: 141 mg/dL — ABNORMAL HIGH (ref 70–99)
Potassium: 2.7 mmol/L — CL (ref 3.5–5.1)
Sodium: 138 mmol/L (ref 135–145)
Total Bilirubin: 2 mg/dL — ABNORMAL HIGH (ref 0.3–1.2)
Total Protein: 5.9 g/dL — ABNORMAL LOW (ref 6.5–8.1)

## 2021-04-19 LAB — PROTIME-INR
INR: 1.9 — ABNORMAL HIGH (ref 0.8–1.2)
Prothrombin Time: 22.1 seconds — ABNORMAL HIGH (ref 11.4–15.2)

## 2021-04-19 LAB — MRSA NEXT GEN BY PCR, NASAL: MRSA by PCR Next Gen: DETECTED — AB

## 2021-04-19 LAB — APTT: aPTT: 38 seconds — ABNORMAL HIGH (ref 24–36)

## 2021-04-19 LAB — MAGNESIUM: Magnesium: 1.8 mg/dL (ref 1.7–2.4)

## 2021-04-19 MED ORDER — POTASSIUM CHLORIDE 2 MEQ/ML IV SOLN
INTRAVENOUS | Status: DC
  Filled 2021-04-19: qty 1000

## 2021-04-19 MED ORDER — CHLORHEXIDINE GLUCONATE 0.12 % MT SOLN
15.0000 mL | Freq: Two times a day (BID) | OROMUCOSAL | Status: DC
  Administered 2021-04-19 – 2021-05-03 (×25): 15 mL via OROMUCOSAL
  Filled 2021-04-19 (×22): qty 15

## 2021-04-19 MED ORDER — ORAL CARE MOUTH RINSE
15.0000 mL | Freq: Two times a day (BID) | OROMUCOSAL | Status: DC
  Administered 2021-04-19 – 2021-05-06 (×25): 15 mL via OROMUCOSAL

## 2021-04-19 MED ORDER — VANCOMYCIN HCL 1750 MG/350ML IV SOLN
1750.0000 mg | INTRAVENOUS | Status: DC
  Administered 2021-04-19 – 2021-04-22 (×4): 1750 mg via INTRAVENOUS
  Filled 2021-04-19 (×4): qty 350

## 2021-04-19 MED ORDER — POTASSIUM CHLORIDE 10 MEQ/100ML IV SOLN: 10.0000 meq | INTRAVENOUS | Status: DC

## 2021-04-19 MED ORDER — POTASSIUM CHLORIDE 10 MEQ/100ML IV SOLN
10.0000 meq | INTRAVENOUS | Status: AC
  Administered 2021-04-19 – 2021-04-20 (×4): 10 meq via INTRAVENOUS
  Filled 2021-04-19 (×4): qty 100

## 2021-04-19 MED ORDER — LACTATED RINGERS IV SOLN: INTRAVENOUS | Status: DC

## 2021-04-19 MED ORDER — POTASSIUM CHLORIDE CRYS ER 20 MEQ PO TBCR
40.0000 meq | EXTENDED_RELEASE_TABLET | Freq: Once | ORAL | Status: DC
  Filled 2021-04-19: qty 2

## 2021-04-19 MED ORDER — POTASSIUM CHLORIDE 10 MEQ/100ML IV SOLN
10.0000 meq | INTRAVENOUS | Status: AC
  Administered 2021-04-19 (×4): 10 meq via INTRAVENOUS
  Filled 2021-04-19 (×4): qty 100

## 2021-04-19 NOTE — Plan of Care (Signed)

## 2021-04-19 NOTE — Evaluation (Signed)
Physical Therapy Evaluation Patient Details Name: [name] MRN: [id number] DOB: 1936/04/21 Today's Date: 04/19/2021  History of Present Illness  The pt is an 85 yo male presenting 11/5 with AMS after being d/c home on 11/4 from [facility] where he was admitted for fall with LOC and SDH. Chest CT revealed large mediastinum fluid collection with gas, with a transverse fracture of sternum. PMH includes: frequent falls, polypharmacy, neuropathy, dementia, chronic hyponatremia, and HTN.   Clinical Impression  Pt in bed upon arrival of PT, agreeable to evaluation at this time. Prior to admission the pt was home with assist of family and PCA following last d/c. However, the pt required assist with all mobility and ADLs, and was reported to have multiple falls during time at home. The pt now presents with limitations in functional mobility, strength, power, stability, coordination, and cognition due to above dx, and will continue to benefit from skilled PT to address these deficits. The pt was unable to consistently follow commands, answer questions, or be redirected during this session and therefore requires significant assist of 2 to perform bed mobility and activities while sitting statically at the EOB at this time. The pt required maxA of 2 to complete bed mobility and maxA-minG while sitting EOB due to fluctuations in pt balance and attention to positioning. Due to frequency of falls at home and increased need for physical assist with all movements due to poor physical strength and cognitive deficits, I recommend d/c to SNF for continued rehab. Will continue to assess with each visit.        Recommendations for follow up therapy are one component of a multi-disciplinary discharge planning process, led by the attending physician.  Recommendations may be updated based on patient status, additional functional criteria and insurance authorization.  Follow Up Recommendations Skilled nursing-short  term rehab (<3 hours/day)    Assistance Recommended at Discharge Frequent or constant Supervision/Assistance  Functional Status Assessment Patient has had a recent decline in their functional status and demonstrates the ability to make significant improvements in function in a reasonable and predictable amount of time.  Equipment Recommendations  None recommended by PT (defer to post acute)    Recommendations for Other Services       Precautions / Restrictions Precautions Precautions: Fall Precaution Comments: pt with recent SDH, frequent falls, delirium Restrictions Weight Bearing Restrictions: No      Mobility  Bed Mobility Overal bed mobility: Needs Assistance Bed Mobility: Rolling;Supine to Sit;Sit to Supine Rolling: Max assist   Supine to sit: Max assist;+2 for physical assistance;HOB elevated Sit to supine: Total assist;+2 for physical assistance;HOB elevated   General bed mobility comments: pt resistant to rolling onto L side, unable to follow cues to assist with transition to sitting other than some movements of RLE. totalA to return to bed    Transfers                   General transfer comment: unable to complete at this time. extending at trunk and BUE when attempting to complete stand from EOB with HHA of 2.       Modified Rankin (Stroke Patients Only) Modified Rankin (Stroke Patients Only) Pre-Morbid Rankin Score: Moderately severe disability Modified Rankin: Severe disability     Balance Overall balance assessment: Needs assistance;History of Falls Sitting-balance support: Bilateral upper extremity supported;Feet supported Sitting balance-Leahy Scale: Poor Sitting balance - Comments: intermittently maxA to minG due to pt restlessness and posterior lean. Postural control: Posterior lean  Standing balance comment: pt unable                             Pertinent Vitals/Pain Pain Assessment: Faces Faces Pain Scale: Hurts even  more Pain Location: pt unable to verbalize when asked, but grimacing with all wt shift to L (large hematoma on L side) Pain Descriptors / Indicators: Discomfort;Grimacing Pain Intervention(s): Limited activity within patient's tolerance;Monitored during session;Repositioned    Home Living Family/patient expects to be discharged to:: Private residence Living Arrangements: Spouse/significant other Available Help at Discharge: Family;Personal care attendant;Available 24 hours/day Type of Home: House Home Access: Ramped entrance       Home Layout: One level   Additional Comments: pt with reccent hospitalization that recomended d/c SNF, but went home with increased caregiver assist at home where he sustained increased falls    Prior Function Prior Level of Function : Needs assist;History of Falls (last six months);Patient poor historian/Family not available             Mobility Comments: no family present and pt unable to answer questions about PLOF. suspect needing assist for all OOB mobility, pt also wiht frequent falls ADLs Comments: no family present and pt unable to answer questions about PLOF. suspect needing assist for all ADLs and IADLs     Hand Dominance   Dominant Hand: Right    Extremity/Trunk Assessment   Upper Extremity Assessment Upper Extremity Assessment: Defer to OT evaluation    Lower Extremity Assessment Lower Extremity Assessment: Generalized weakness;Difficult to assess due to impaired cognition (pt with better movement of RLE in bed than LLE, large hematoma on L hip)    Cervical / Trunk Assessment Cervical / Trunk Assessment: Kyphotic;Other exceptions Cervical / Trunk Exceptions: large hematoma on L side  Communication   Communication: HOH  Cognition Arousal/Alertness: Awake/alert Behavior During Therapy: Restless;Flat affect Overall Cognitive Status: No family/caregiver present to determine baseline cognitive functioning Area of Impairment:  Orientation;Memory;Attention;Following commands;Safety/judgement;Awareness;Problem solving                 Orientation Level: Disoriented to;Place;Time;Situation Current Attention Level: Focused (for very short bouts, inconsistently able to stop talking long enough to attend to cues) Memory: Decreased recall of precautions;Decreased short-term memory Following Commands: Follows one step commands inconsistently Safety/Judgement: Decreased awareness of safety;Decreased awareness of deficits Awareness: Intellectual Problem Solving: Slow processing;Decreased initiation;Difficulty sequencing;Requires verbal cues General Comments: pt with nonsensical speech, not following commands, but did respond to one cue appropriately during the session. poor attention or ability to redirect        General Comments General comments (skin integrity, edema, etc.): large hematoma L hip/side. small area of wound/swelling with redness and some yellow in pit of L elbow        Assessment/Plan    PT Assessment Patient needs continued PT services  PT Problem List Decreased activity tolerance;Decreased strength;Decreased range of motion;Decreased balance;Decreased mobility;Decreased coordination;Decreased cognition;Decreased safety awareness;Pain;Decreased skin integrity       PT Treatment Interventions DME instruction;Gait training;Functional mobility training;Stair training;Therapeutic activities;Therapeutic exercise;Balance training;Patient/family education    PT Goals (Current goals can be found in the Care Plan section)  Acute Rehab PT Goals Patient Stated Goal: to be in the sunshine PT Goal Formulation: Patient unable to participate in goal setting Time For Goal Achievement: 05/03/21 Potential to Achieve Goals: Fair    Frequency Min 2X/week   Barriers to discharge Decreased caregiver support pt requires significant physical assist of 2  Co-evaluation PT/OT/SLP Co-Evaluation/Treatment:  Yes Reason for Co-Treatment: Necessary to address cognition/behavior during functional activity;For patient/therapist safety;To address functional/ADL transfers PT goals addressed during session: Mobility/safety with mobility;Balance         AM-PAC PT "6 Clicks" Mobility  Outcome Measure Help needed turning from your back to your side while in a flat bed without using bedrails?: A Lot Help needed moving from lying on your back to sitting on the side of a flat bed without using bedrails?: Total Help needed moving to and from a bed to a chair (including a wheelchair)?: Total Help needed standing up from a chair using your arms (e.g., wheelchair or bedside chair)?: Total Help needed to walk in hospital room?: Total Help needed climbing 3-5 steps with a railing? : Total 6 Click Score: 7    End of Session Equipment Utilized During Treatment: Gait belt Activity Tolerance: Patient tolerated treatment well;Patient limited by pain Patient left: in bed;with call bell/phone within reach;with bed alarm set;with SCD's reapplied Nurse Communication: Mobility status PT Visit Diagnosis: Other abnormalities of gait and mobility (R26.89);Repeated falls (R29.6);Muscle weakness (generalized) (M62.81);History of falling (Z91.81)    Time: 1308-6578 PT Time Calculation (min) (ACUTE ONLY): 28 min   Charges:   PT Evaluation $PT Eval Moderate Complexity: 1 Mod          West Carbo, PT, DPT   Acute Rehabilitation Department Pager #: [phone]  Sandra Cockayne 04/19/2021, 9:03 AM

## 2021-04-19 NOTE — Progress Notes (Signed)
[facility] night cross cover note:  I was notified of serum potassium level of 2.6 this morning.  I have placed order for preliminary supplementation in the form of potassium chloride 40 mill equivalents p.o. x1, and also added-on a serum magnesium level.     Babs Bertin, DO Hospitalist

## 2021-04-19 NOTE — Progress Notes (Signed)
[facility] night cross cover note:  I was contacted by RN regarding updated serum potassium level of 2.7 this evening.  Patient currently reported to be asymptomatic.  Vital signs stable.  Per my chart review, corresponding serum creatinine noted to be 0.94. Potassium level was noted to be 2.6 per this morning's labs, with interval administration of 40 mEq of IV potassium chloride per day team.  In maintaining consistency with IV route of administration, I ordered an additional 40 mEq of IV potassium chloride to be administered over 4 hours, and confirmed that repeat BMP and serum magnesium levels have been ordered with tomorrow morning's labs.     Babs Bertin, DO Hospitalist

## 2021-04-19 NOTE — Progress Notes (Addendum)
PROGRESS NOTE    [name]   TDD:[id number]  DOB: 03-14-1936  PCP: [name], DO    DOA: 04/25/2021 LOS: 1    Brief Narrative / Hospital Course to Date:    85 y.o. male with medical history significant of HTN; HLD; remote prostate CA; CAD s/p remote CABG; and abdominal wall melanoma presenting to [facility] on 04/17/21 with sepsis, presenting with fever, encephalopathy, respiratory distress and A-fib RVR.  Transferred to [facility] on 05/02/2021 for CT surgery coverage due to a mediastinal fluid collection associated with transverse sternal fracture.  Patient found to have Staph bacteremia with findings of pulmonary septic emboli on CT scan.  Infectious disease, CT surgery, neurosurgery, palliative care consulted.  Assessment & Plan   Principal Problem:   Sepsis due to methicillin resistant Staphylococcus aureus (MRSA) ([place]) Active Problems:   History of prostate cancer   Essential hypertension   CAD (coronary artery disease)   Hyperlipidemia, mixed   Lumbar compression fracture (HCC)   Atrial fibrillation with RVR (HCC)   Sternal wound infection   SDH (subdural hematoma)   Pneumonia involving right lung   Delirium   DNR (do not resuscitate)   Goals of care, counseling/discussion   Severe sepsis due to MRSA bacteremia -Sepsis POA with leukocytosis, tachycardia, tachypnea, hypoxia.  Evidence of acute organ failure with elevated lactate >2 that is not easily explained by another condition. Blood cultures are positive for MRSA. Septic emboli appreciated on imaging. Suspected source is unknown - PNA, spinal osteo, recent skin surgery are considerations (see below) --Follow up blood and urine cx's --Continue IV Vancomycin --ID following --Monitor hemodynamics closely --Follow up pending TTE --Palliative care consulted given severity of illness and overall poor prognosis with high risk of morbidity and mortality   Anterior mediastinal abscess/phlegmon -  Mediastinal fluid collection with gas/fluid with transverse sternal fracture.  --CT surgery consulted --Pt is a poor surgical candidate --On Vancomycin with ID following   Subdural Hematoma (SDH) - pt has recent SDH x 2, acute on chronic, seen at [facility].  Due to multiple falls.  -Also with compression fractures, ?osteomyelitis -Will ask neurosurgery to review images to ensure that no intervention is needed -AC and ASA are contraindicated at this time   R midlung PNA vs Septic Emboli - patient appears not to have respiratory symptoms but has O2 requirement with on admission.   Appears to have septic emboli on imaging. Treated with Vanc/Zosyn on admission. Zosyn d/c'd per ID Continue Vancomycin  Hypokalemia - K 2.6 (11/7) - change IVF's to LR with 40 mEqLKCl and K riders x4.  Follow Mg level. Replace K and Mg, target K>4, Mg>2 given A-fib RVR.   New onset A-fib - POA, likely triggered by infection.  Started on Cardizem drip on admission, continued. Anticoagulation contraindicated in setting of SDH.  Thoracolumbar compression fractures with ? Osteomyelitis - no surgical intervention planned.  Seen by neurosurgery.  On antibiotics per ID.   Abdominal wall melanoma - pt has steri-strips in place, no obvious infection.  Path reports from excision reflect negative margins. Monitor.   L AC superficial thrombophlebitis -  --Heat packs PRN --No DVT on imaging --Not a candidate for [facility] or ASA due to SDH   HTN - Norvasc and Cozaar on hold due to soft BPs   HLD - Hold pravastatin for now with AMS, resume when safe to take PO   Remote prostate CA - with bladder wall thickening noted on imaging.  Monitor for now.  Further evaluation once above acute issues are improved.   CAD - s/p remote CABG   Delirium - most likely due to infection, unclear if recent traumatic SDH contributing.   --Hold sedating meds: Primidone, benadryl (consider d/c), Neurontin   DN R - previously DNR/DNI during the  admission at [facility], continued at the time of admission. Very guarded prognosis. --Palliative care consulted  Patient BMI: There is no height or weight on file to calculate BMI.   DVT prophylaxis: SCDs Start: 05/11/2021 0913 SCDs Start: 04/19/2021 0913   Diet:  Diet Orders (From admission, onward)     Start     Ordered   04/28/2021 0916  Diet Heart Room service appropriate? Yes; Fluid consistency: Thin  Diet effective ____       Comments: Effective once patient passes swallow evaluation  Question Answer Comment  Room service appropriate? Yes   Fluid consistency: Thin      04/23/2021 0917              Code Status: DNR   Subjective 04/19/21    Pt remains delirious. Sleeping and talking in his sleep.  Wakes easily but does not stay awake long, talks gibberish and unable to answer questions or follow commands.     Disposition Plan & Communication   Status is: Inpatient  Remains inpatient appropriate because: severity of illness with ongoing evaluation, on IV antibiotics.   Family Communication: none at bedside during encounter. Will attempt to call wife this afternoon/   Consults, Procedures, Significant Events   Consultants:  Infectious disease Palliative care Cardiothoracic surgery Neurosurgery  Procedures:    Antimicrobials:  Anti-infectives (From admission, onward)    Start     Dose/Rate Route Frequency Ordered Stop   04/19/21 0830  vancomycin (VANCOREADY) IVPB 1750 mg/350 mL        1,750 mg 175 mL/hr over 120 Minutes Intravenous Every 24 hours 04/19/21 0742     04/19/2021 2000  vancomycin (VANCOREADY) IVPB 1750 mg/350 mL  Status:  Discontinued        1,750 mg 175 mL/hr over 120 Minutes Intravenous Every 24 hours 04/17/2021 1007 04/17/2021 1706   04/28/2021 1400  piperacillin-tazobactam (ZOSYN) IVPB 3.375 g  Status:  Discontinued        3.375 g 12.5 mL/hr over 240 Minutes Intravenous Every 8 hours 05/02/2021 1007 04/19/21 0742         Micro    Objective    Vitals:   04/19/21 1200 04/19/21 1204 04/19/21 1300 04/19/21 1400  BP:  (!) 145/72    Pulse: 91 100 (!) 101 97  Resp: 19 (!) 21 19 19   Temp:  99.3 F (37.4 C)    TempSrc:  Oral    SpO2: 92% 97% 98% 95%    Intake/Output Summary (Last 24 hours) at 04/19/2021 1516 Last data filed at 04/19/2021 1300 Gross per 24 hour  Intake 3598.07 ml  Output 600 ml  Net 2998.07 ml   There were no vitals filed for this visit.  Physical Exam:  General exam: sleeping, wakes briefly, confused, no acute distress Respiratory system: decreased breath sounds, mildly increased respiratory effort. Cardiovascular system: normal S1/S2, irregularly irregular, mid-chest / upper sternum swelling  Gastrointestinal system: soft, non-tender abdomen. Central nervous system: unable to evaluate due to patient's current condition, not following commands Skin: dry, intact, normal temperature Psychiatry: delirious, somnolent  Labs   Data Reviewed: I have personally reviewed following labs and imaging studies  CBC: Recent Labs  Lab 04/17/21 0856 05/02/2021  0539 04/19/21 0256  WBC 10.8* 11.6* 10.8*  NEUTROABS 8.9* 10.2*  --   HGB 9.9* 8.9* 8.0*  HCT 29.3* 27.3* 23.0*  MCV 101.4* 106.6* 97.9  PLT 303 296 016   Basic Metabolic Panel: Recent Labs  Lab 04/17/21 0856 04/19/2021 0539 04/19/21 0256  NA 127* 132* 135  K 4.9 3.8 2.6*  CL 92* 100 104  CO2 22 19* 21*  GLUCOSE 126* 131* 129*  BUN 30* 23 20  CREATININE 1.07 0.92 0.86  CALCIUM 9.1 8.9 9.0  MG  --   --  1.8   GFR: Estimated Creatinine Clearance: 71.4 mL/min (by C-G formula based on SCr of 0.86 mg/dL). Liver Function Tests: Recent Labs  Lab 04/17/21 0856 04/24/2021 0539  AST 30 43*  ALT 20 24  ALKPHOS 63 54  BILITOT 1.1 1.3*  PROT 6.8 6.0*  ALBUMIN 2.7* 2.0*   No results for input(s): LIPASE, AMYLASE in the last 168 hours. No results for input(s): AMMONIA in the last 168 hours. Coagulation Profile: Recent Labs  Lab 04/17/21 0856   INR 1.2   Cardiac Enzymes: No results for input(s): CKTOTAL, CKMB, CKMBINDEX, TROPONINI in the last 168 hours. BNP (last 3 results) No results for input(s): PROBNP in the last 8760 hours. HbA1C: No results for input(s): HGBA1C in the last 72 hours. CBG: Recent Labs  Lab 04/17/21 2129  GLUCAP 126*   Lipid Profile: No results for input(s): CHOL, HDL, LDLCALC, TRIG, CHOLHDL, LDLDIRECT in the last 72 hours. Thyroid Function Tests: No results for input(s): TSH, T4TOTAL, FREET4, T3FREE, THYROIDAB in the last 72 hours. Anemia Panel: No results for input(s): VITAMINB12, FOLATE, FERRITIN, TIBC, IRON, RETICCTPCT in the last 72 hours. Sepsis Labs: Recent Labs  Lab 04/17/21 0856 04/17/21 0947 04/17/21 1158 04/25/2021 0539 04/19/2021 0737  PROCALCITON  --   --   --   --  3.69  LATICACIDVEN 2.6* 2.9* 1.6 1.6  --     Recent Results (from the past 240 hour(s))  Resp Panel by RT-PCR (Flu A&B, Covid) Nasopharyngeal Swab     Status: None   Collection Time: 04/17/21  8:56 AM   Specimen: Nasopharyngeal Swab; Nasopharyngeal(NP) swabs in vial transport medium  Result Value Ref Range Status   SARS Coronavirus 2 by RT PCR NEGATIVE NEGATIVE Final    Comment: (NOTE) SARS-CoV-2 target nucleic acids are NOT DETECTED.  The SARS-CoV-2 RNA is generally detectable in upper respiratory specimens during the acute phase of infection. The lowest concentration of SARS-CoV-2 viral copies this assay can detect is 138 copies/mL. A negative result does not preclude SARS-Cov-2 infection and should not be used as the sole basis for treatment or other patient management decisions. A negative result may occur with  improper specimen collection/handling, submission of specimen other than nasopharyngeal swab, presence of viral mutation(s) within the areas targeted by this assay, and inadequate number of viral copies(<138 copies/mL). A negative result must be combined with clinical observations, patient history, and  epidemiological information. The expected result is Negative.  Fact Sheet for Patients:  EntrepreneurPulse.com.au  Fact Sheet for Healthcare Providers:  IncredibleEmployment.be  This test is no t yet approved or cleared by the [place] FDA and  has been authorized for detection and/or diagnosis of SARS-CoV-2 by FDA under an Emergency Use Authorization (EUA). This EUA will remain  in effect (meaning this test can be used) for the duration of the COVID-19 declaration under Section 564(b)(1) of the Act, 21 U.S.C.section 360bbb-3(b)(1), unless the authorization is terminated  or  revoked sooner.       Influenza A by PCR NEGATIVE NEGATIVE Final   Influenza B by PCR NEGATIVE NEGATIVE Final    Comment: (NOTE) The Xpert Xpress SARS-CoV-2/FLU/RSV plus assay is intended as an aid in the diagnosis of influenza from Nasopharyngeal swab specimens and should not be used as a sole basis for treatment. Nasal washings and aspirates are unacceptable for Xpert Xpress SARS-CoV-2/FLU/RSV testing.  Fact Sheet for Patients: EntrepreneurPulse.com.au  Fact Sheet for Healthcare Providers: IncredibleEmployment.be  This test is not yet approved or cleared by the [place] FDA and has been authorized for detection and/or diagnosis of SARS-CoV-2 by FDA under an Emergency Use Authorization (EUA). This EUA will remain in effect (meaning this test can be used) for the duration of the COVID-19 declaration under Section 564(b)(1) of the Act, 21 U.S.C. section 360bbb-3(b)(1), unless the authorization is terminated or revoked.  Performed at [facility], [address] [place], [place] [address]   Culture, blood (Routine x 2)     Status: Abnormal (Preliminary result)   Collection Time: 04/17/21  9:20 AM   Specimen: BLOOD  Result Value Ref Range Status   Specimen Description   Final    BLOOD RIGHT FA Performed at  [facility], [address] [place], [place] [address]    Special Requests   Final    BOTTLES DRAWN AEROBIC AND ANAEROBIC Blood Culture adequate volume Performed at [facility], [address], [place], [place] [address]    Culture  Setup Time   Final    GRAM POSITIVE COCCI IN BOTH AEROBIC AND ANAEROBIC BOTTLES Organism ID to follow CRITICAL RESULT CALLED TO, READ BACK BY AND VERIFIED WITH: SAM RAUER 2203 04/17/21 LFD Performed at [facility], [address] [place], [place] [address]    Culture STAPHYLOCOCCUS AUREUS (A)  Final   Report Status PENDING  Incomplete  Blood Culture ID Panel (Reflexed)     Status: Abnormal   Collection Time: 04/17/21  9:20 AM  Result Value Ref Range Status   Enterococcus faecalis NOT DETECTED NOT DETECTED Final   Enterococcus Faecium NOT DETECTED NOT DETECTED Final   Listeria monocytogenes NOT DETECTED NOT DETECTED Final   Staphylococcus species DETECTED (A) NOT DETECTED Final    Comment: CRITICAL RESULT CALLED TO, READ BACK BY AND VERIFIED WITH: SAM RAUER 2203 04/17/21 LFD    Staphylococcus aureus (BCID) DETECTED (A) NOT DETECTED Final    Comment: Methicillin (oxacillin)-resistant Staphylococcus aureus (MRSA). MRSA is predictably resistant to beta-lactam antibiotics (except ceftaroline). Preferred therapy is vancomycin unless clinically contraindicated. Patient requires contact precautions if  hospitalized. CRITICAL RESULT CALLED TO, READ BACK BY AND VERIFIED WITH: SAM RAUER 2203 04/17/21 LFD    Staphylococcus epidermidis NOT DETECTED NOT DETECTED Final   Staphylococcus lugdunensis NOT DETECTED NOT DETECTED Final   Streptococcus species NOT DETECTED NOT DETECTED Final   Streptococcus agalactiae NOT DETECTED NOT DETECTED Final   Streptococcus pneumoniae NOT DETECTED NOT DETECTED Final   Streptococcus pyogenes NOT DETECTED NOT DETECTED Final   A.calcoaceticus-baumannii NOT DETECTED NOT DETECTED Final   Bacteroides  fragilis NOT DETECTED NOT DETECTED Final   Enterobacterales NOT DETECTED NOT DETECTED Final   Enterobacter cloacae complex NOT DETECTED NOT DETECTED Final   Escherichia coli NOT DETECTED NOT DETECTED Final   Klebsiella aerogenes NOT DETECTED NOT DETECTED Final   Klebsiella oxytoca NOT DETECTED NOT DETECTED Final   Klebsiella pneumoniae NOT DETECTED NOT DETECTED Final   Proteus species NOT DETECTED NOT DETECTED Final  Salmonella species NOT DETECTED NOT DETECTED Final   Serratia marcescens NOT DETECTED NOT DETECTED Final   Haemophilus influenzae NOT DETECTED NOT DETECTED Final   Neisseria meningitidis NOT DETECTED NOT DETECTED Final   Pseudomonas aeruginosa NOT DETECTED NOT DETECTED Final   Stenotrophomonas maltophilia NOT DETECTED NOT DETECTED Final   Candida albicans NOT DETECTED NOT DETECTED Final   Candida auris NOT DETECTED NOT DETECTED Final   Candida glabrata NOT DETECTED NOT DETECTED Final   Candida krusei NOT DETECTED NOT DETECTED Final   Candida parapsilosis NOT DETECTED NOT DETECTED Final   Candida tropicalis NOT DETECTED NOT DETECTED Final   Cryptococcus neoformans/gattii NOT DETECTED NOT DETECTED Final   Meth resistant mecA/C and MREJ DETECTED (A) NOT DETECTED Final    Comment: CRITICAL RESULT CALLED TO, READ BACK BY AND VERIFIED WITH: SAM RAUER 2203 04/17/21 LFD Performed at [facility], [address] [place], [place] [address]   Culture, blood (Routine x 2)     Status: Abnormal (Preliminary result)   Collection Time: 04/17/21  9:47 AM   Specimen: BLOOD  Result Value Ref Range Status   Specimen Description   Final    BLOOD RIGHT ARM Performed at [facility], [address] [place], [place] [address]    Special Requests   Final    BOTTLES DRAWN AEROBIC AND ANAEROBIC Blood Culture results may not be optimal due to an excessive volume of blood received in culture bottles Performed at [facility], [address] [place], [place]  [address]    Culture  Setup Time   Final    GRAM POSITIVE COCCI IN BOTH AEROBIC AND ANAEROBIC BOTTLES CRITICAL VALUE NOTED.  VALUE IS CONSISTENT WITH PREVIOUSLY REPORTED AND CALLED VALUE. Performed at [facility], [address] [place], [place] [address]    Culture (A)  Final    STAPHYLOCOCCUS AUREUS SUSCEPTIBILITIES TO FOLLOW Performed at [facility], [place] [address] [place], [place] [address]    Report Status PENDING  Incomplete  Urine Culture     Status: Abnormal   Collection Time: 04/17/21 11:58 AM   Specimen: Urine, Random  Result Value Ref Range Status   Specimen Description   Final    URINE, RANDOM Performed at [facility], [address], [place], [place] [address]    Special Requests   Final    NONE Performed at [facility], [address] [place], [place] [address]    Culture MULTIPLE SPECIES PRESENT, SUGGEST RECOLLECTION (A)  Final   Report Status 04/19/2021 FINAL  Final  MRSA Next Gen by PCR, Nasal     Status: Abnormal   Collection Time: 04/19/21 11:13 AM   Specimen: Nasal Mucosa; Nasal Swab  Result Value Ref Range Status   MRSA by PCR Next Gen DETECTED (A) NOT DETECTED Final    Comment: RESULT CALLED TO, READ BACK BY AND VERIFIED WITH: D WHITE RN 1503 04/19/21 A BROWNING (NOTE) The GeneXpert MRSA Assay (FDA approved for NASAL specimens only), is one component of a comprehensive MRSA colonization surveillance program. It is not intended to diagnose MRSA infection nor to guide or monitor treatment for MRSA infections. Test performance is not FDA approved in patients less than 74 years old. Performed at [facility], [place] [address] [place], [place] [address]       Imaging Studies   DG Chest Portable 1 View  Result Date: 04/17/2021 CLINICAL DATA:  Status post central line placement EXAM: PORTABLE CHEST  1 VIEW COMPARISON:  Film from earlier in the same day. FINDINGS: New left jugular central line is noted with the  catheter tip at the cavoatrial junction. No pneumothorax is noted. Cardiac shadow is stable. Postsurgical changes are again noted. Patchy airspace disease is again noted in the right mid lung. No new focal abnormality is noted. IMPRESSION: No pneumothorax following central line placement on the left. Electronically Signed   By: Inez Catalina M.D.   On: 04/17/2021 21:53     Medications   Scheduled Meds:  azelastine  1 spray Each Nare BID   chlorhexidine  15 mL Mouth Rinse BID   Chlorhexidine Gluconate Cloth  6 each Topical Daily   docusate sodium  100 mg Oral BID   mouth rinse  15 mL Mouth Rinse q12n4p   sodium chloride flush  3 mL Intravenous Q12H   Continuous Infusions:  sodium chloride 100 mL/hr at 04/19/21 1300   diltiazem (CARDIZEM) infusion 10 mg/hr (04/19/21 1300)   vancomycin Stopped (04/19/21 1150)       LOS: 1 day    Time spent: 30 minutes    Ezekiel Slocumb, DO [facility]  04/19/2021, 3:16 PM      If 7PM-7AM, please contact night-coverage. How to contact the [facility] Attending or Consulting provider [place] or covering provider during after hours [place], for this patient?    Check the care team in [facility] and look for a) attending/consulting [facility] provider listed and b) the [facility] team listed Log into [email or web address] and use [place]'s universal password to access. If you do not have the password, please contact the hospital operator. Locate the [facility] provider you are looking for under [facility] and page to a number that you can be directly reached. If you still have difficulty reaching the provider, please page the [facility] (Director on Call) for the Hospitalists listed on amion for assistance.

## 2021-04-19 NOTE — Progress Notes (Addendum)
Date and time results received: 04/19/21 2049   Test: Potassium Critical Value: 2.7  Name of Provider Notified: J. Howerter,MD  Orders Received? Or Actions Taken?: Awaiting orders  Pt Bp 150/85, HR 99, RR 20. Pt observed to be comfortable.  2120 Orders received.

## 2021-04-19 NOTE — Progress Notes (Signed)
  Echocardiogram 2D Echocardiogram has been performed.  Merrie Roof F 04/19/2021, 2:53 PM

## 2021-04-19 NOTE — Progress Notes (Signed)
Daily Progress Note   Patient Name: [name]       Date: 04/19/2021 DOB: [birth year]  Age: 85 y.o. MRN#: [id number] Attending Physician: Ezekiel Slocumb, DO Primary Care Physician: [name], DO Admit Date: 04/21/2021  Reason for Consultation/Follow-up: Establishing goals of care  Subjective: Medical records reviewed. Patient assessed at the bedside. He is speaking intelligibly, no family present.  I called patient's wife [name] to continue goals of care conversation and provide support. She is initially concerned, asking if palliative care is the same as hospice. I reviewed the difference between palliative care and hospice in detail, providing reassurance that palliative care is an extra layer of support for patients and families, whether the goal is to continue aggressive care or to transition to comfort-focused care and hospice at end of life. She verbalizes her understanding and tells me that she is remaining very hopeful for patient's improvement at this time. She plans to visit and we reviewed her questions on visitation and patient's location. Encouraged [name] to continue discussions with family and the medical providers regarding overall plan of care and treatment options, ensuring decisions are within the context of the patient's values and GOCs.    Questions and concerns addressed. PMT will continue to support holistically.  Length of Stay: 1  Current Medications: Scheduled Meds:   azelastine  1 spray Each Nare BID   chlorhexidine  15 mL Mouth Rinse BID   Chlorhexidine Gluconate Cloth  6 each Topical Daily   docusate sodium  100 mg Oral BID   mouth rinse  15 mL Mouth Rinse q12n4p   sodium chloride flush  3 mL Intravenous Q12H    Continuous Infusions:   sodium chloride 100 mL/hr at 04/19/21 1000   diltiazem (CARDIZEM) infusion 15 mg/hr (04/19/21 1000)   potassium chloride 10 mEq (04/19/21 1000)   vancomycin 175 mL/hr at 04/19/21 1000    PRN Meds: acetaminophen **OR** acetaminophen, bisacodyl, haloperidol lactate, ondansetron **OR** ondansetron (ZOFRAN) IV, polyethylene glycol  Physical Exam Vitals and nursing note reviewed.  Constitutional:      General: He is not in acute distress.    Appearance: He is ill-appearing.  Cardiovascular:     Rate and Rhythm: Tachycardia present.  Pulmonary:     Effort: Pulmonary effort is normal.  Neurological:  Mental Status: He is disoriented.            Vital Signs: BP 140/72 (BP Location: Left Arm)   Pulse (!) 103   Temp 99.7 F (37.6 C) (Axillary)   Resp 19   SpO2 94%  SpO2: SpO2: 94 % O2 Device: O2 Device: Room Air O2 Flow Rate: O2 Flow Rate (L/min): 0 L/min  Intake/output summary:  Intake/Output Summary (Last 24 hours) at 04/19/2021 1016 Last data filed at 04/19/2021 1000 Gross per 24 hour  Intake 2797.86 ml  Output 1300 ml  Net 1497.86 ml   LBM:   Baseline Weight:   Most recent weight:         Palliative Assessment/Data: 30%      Patient Active Problem List   Diagnosis Date Noted   Sternal wound infection 05/05/2021   SDH (subdural hematoma) 04/23/2021   Pneumonia involving right lung 05/01/2021   Delirium 04/17/2021   DNR (do not resuscitate) 05/04/2021   Goals of care, counseling/discussion    Atrial fibrillation with RVR ([place]) 04/17/2021   DDD (degenerative disc disease), lumbar 03/03/2021   Cellulitis 02/18/2021   Lumbar compression fracture ([place]) 02/18/2021   Compression fracture of first lumbar vertebra ([place]) 02/17/2021   Cellulitis and abscess of foot    MRSA bacteremia 08/10/2020   Sepsis due to methicillin resistant Staphylococcus aureus (MRSA) ([place]) 04/24/2020   Cellulitis of knee, left 04/22/2020   Hypokalemia 04/22/2020   Osteoarthritis of  multiple joints 12/15/2017   Hyponatremia 03/28/2017   Abnormality of gait and mobility 01/11/2017   Cloudy vision 01/11/2017   Foot drop, bilateral 01/11/2017   Amputee, great toe, left (HCC) 01/11/2017   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   Macrocytic anemia 09/22/2015   Moderate tricuspid insufficiency 04/21/2015   History of prostate cancer 04/16/2015   Idiopathic peripheral neuropathy 04/16/2015   Essential hypertension 04/16/2015   GERD (gastroesophageal reflux disease) 04/16/2015   Arthritis 04/16/2015   Bilateral lower extremity edema 04/16/2015   Benign essential tremor 10/30/2014   Hyperlipidemia, mixed 63/06/6008   Chronic systolic CHF (congestive heart failure), NYHA class 2 ([place]) 04/23/2014   Mitral insufficiency 11/05/2013   CAD (coronary artery disease) 11/01/2013   Basal cell carcinoma of skin of other parts of face 03/14/2013   Ankle sprain 09/09/2011   History of fibula fracture 09/09/2011    Palliative Care Assessment & Plan   Patient Profile: 85 y.o. male with past medical history of HTN; HLD; remote prostate CA; CAD s/p remote CABG; dementia, recurrent falls, and abdominal wall melanoma admitted on 05/10/2021 with sepsis from [facility] - new onset fever, respiratory distress after discharging from from [facility] yesterday.   Patient admitted to [facility] from 10/29-11/4 after presenting with a fall with LOC.  He was found to have an acute SDH. At [facility] ED, ED scan showed mediastinum fluid collection with gas, with a transverse fracture of sternum. PMT has been consulted to assist with goals of care conversation.   Assessment: Sepsis MRSA bacteremia Anterior mediastinal abscess SDH R lung PNA Afib Dementia/delirium Goals of care conversation  Recommendations/Plan: DNR Full scope otherwise, patient's wife is hopeful at this time and understands she may be overly optimistic  Ongoing goc discussions as clinical picture evolves PMT will continue to  follow  Goals of Care and Additional Recommendations: Limitations on Scope of Treatment: Full Scope Treatment  Prognosis:  Guarded  Discharge Planning: To Be Determined   Total time: 25 minutes Greater than 50% of this time  was spent in counseling and coordinating care related to the above assessment and plan.  Dorthy Cooler, PA-C Palliative Medicine Team Team phone # [phone]  Thank you for allowing the Palliative Medicine Team to assist in the care of this patient. Please utilize secure chat with additional questions, if there is no response within 30 minutes please call the above phone number.  Palliative Medicine Team providers are available by phone from 7am to 7pm daily and can be reached through the team cell phone.  Should this patient require assistance outside of these hours, please call the patient's attending physician.

## 2021-04-19 NOTE — Evaluation (Signed)
Occupational Therapy Evaluation Patient Details Name: [name] MRN: [id number] DOB: 11/26/1935 Today's Date: 04/19/2021   History of Present Illness The pt is an 85 yo male presenting 11/5 with AMS after being d/c home on 11/4 from [facility] where he was admitted for fall with LOC and SDH. Chest CT revealed large mediastinum fluid collection with gas, with a transverse fracture of sternum. PMH includes: frequent falls, polypharmacy, neuropathy, dementia, chronic hyponatremia, and HTN.   Clinical Impression   PTA, pt from home with spouse. Pt with recent admission and DC home though required increased assist for ADLs, mobility and experienced frequent falls. Pt presents now with deficits in cognition, pain, coordination, strength and strength. Pt with constant nonsensical speech throughout session, difficult to redirect and inconsistent command-following. Pt requires grossly Total A x 2 for bed mobility and Total A for all ADLs at this time. Pt requires assist to maintain sitting balance and unable to successfully attempt standing at bedside today. Rec SNF rehab at DC to maximize independence and reduce fall risk.      Recommendations for follow up therapy are one component of a multi-disciplinary discharge planning process, led by the attending physician.  Recommendations may be updated based on patient status, additional functional criteria and insurance authorization.   Follow Up Recommendations  Skilled nursing-short term rehab (<3 hours/day)    Assistance Recommended at Discharge Frequent or constant Supervision/Assistance  Functional Status Assessment  Patient has had a recent decline in their functional status and demonstrates the ability to make significant improvements in function in a reasonable and predictable amount of time.  Equipment Recommendations  Other (comment) (to be determined, pending progress)    Recommendations for Other Services       Precautions /  Restrictions Precautions Precautions: Fall Precaution Comments: pt with recent SDH, frequent falls, delirium Restrictions Weight Bearing Restrictions: No      Mobility Bed Mobility Overal bed mobility: Needs Assistance Bed Mobility: Rolling;Supine to Sit;Sit to Supine Rolling: Max assist   Supine to sit: Max assist;+2 for physical assistance;HOB elevated Sit to supine: Total assist;+2 for physical assistance;HOB elevated   General bed mobility comments: pt resistant to rolling onto L side, unable to follow cues to assist with transition to sitting other than some movements of RLE. totalA to return to bed    Transfers                   General transfer comment: unable to complete at this time. extending at trunk and BUE when attempting to complete stand from EOB with HHA of 2.      Balance Overall balance assessment: Needs assistance;History of Falls Sitting-balance support: Bilateral upper extremity supported;Feet supported Sitting balance-Leahy Scale: Poor Sitting balance - Comments: intermittently maxA to minG due to pt restlessness and posterior lean. Postural control: Posterior lean     Standing balance comment: pt unable                           ADL either performed or assessed with clinical judgement   ADL Overall ADL's : Needs assistance/impaired     Grooming: Total assistance;Sitting;Wash/dry face Grooming Details (indicate cue type and reason): Total A to wash face sitting EOB, despite cues, assist to initiate, etc - pt unable to assist with task  General ADL Comments: Total A for all ADLs at this time due to cognition     Vision Ability to See in Adequate Light: 1 Impaired Patient Visual Report: No change from baseline Vision Assessment?: Vision impaired- to be further tested in functional context Additional Comments: to be further assessed, pt kept eyes closed for much of session      Perception     Praxis      Pertinent Vitals/Pain Pain Assessment: Faces Faces Pain Scale: Hurts even more Pain Location: pt unable to verbalize when asked, but grimacing with all wt shift to L (large contusion on L side) Pain Descriptors / Indicators: Discomfort;Grimacing Pain Intervention(s): Monitored during session;Limited activity within patient's tolerance     Hand Dominance Right   Extremity/Trunk Assessment Upper Extremity Assessment Upper Extremity Assessment: Generalized weakness;Difficult to assess due to impaired cognition   Lower Extremity Assessment Lower Extremity Assessment: Defer to PT evaluation   Cervical / Trunk Assessment Cervical / Trunk Assessment: Kyphotic;Other exceptions Cervical / Trunk Exceptions: large contusion on L side   Communication Communication Communication: HOH   Cognition Arousal/Alertness: Awake/alert Behavior During Therapy: Restless;Flat affect Overall Cognitive Status: No family/caregiver present to determine baseline cognitive functioning Area of Impairment: Orientation;Memory;Attention;Following commands;Safety/judgement;Awareness;Problem solving                 Orientation Level: Disoriented to;Place;Time;Situation Current Attention Level: Focused (for very short bouts, inconsistently able to stop talking long enough to attend to cues) Memory: Decreased recall of precautions;Decreased short-term memory Following Commands: Follows one step commands inconsistently Safety/Judgement: Decreased awareness of safety;Decreased awareness of deficits Awareness: Intellectual Problem Solving: Slow processing;Decreased initiation;Difficulty sequencing;Requires verbal cues General Comments: pt with constant nonsensical speech, not following commands consistently. When directed "lets sit up", pt reports "ok, got to get my legs over there" and able to initiate some tasks. intermittently would open eyes when cued.     General Comments   large contusion L hip/side. small area of wound/swelling with redness and some yellow in pit of L elbow    Exercises     Shoulder Instructions      Home Living Family/patient expects to be discharged to:: Private residence Living Arrangements: Spouse/significant other Available Help at Discharge: Family;Personal care attendant;Available 24 hours/day Type of Home: House Home Access: Ramped entrance     Home Layout: One level     Bathroom Shower/Tub: Teacher, early years/pre: Handicapped height     Home Equipment: Conservation officer, nature (2 wheels)   Additional Comments: pt with recent hospitalization that recomended d/c SNF, but went home with increased caregiver assist at home where he sustained increased falls. Home setup obtained from chart      Prior Functioning/Environment Prior Level of Function : Needs assist;History of Falls (last six months);Patient poor historian/Family not available             Mobility Comments: no family present and pt unable to answer questions about PLOF. suspect needing assist for all OOB mobility, pt also with frequent falls. Noted intermittent use of RW in chart ADLs Comments: no family present and pt unable to answer questions about PLOF. suspect needing assist for all ADLs and IADLs        OT Problem List: Decreased strength;Decreased activity tolerance;Impaired balance (sitting and/or standing);Decreased coordination;Decreased cognition;Decreased safety awareness;Pain      OT Treatment/Interventions: Self-care/ADL training;Therapeutic exercise;DME and/or AE instruction;Therapeutic activities;Patient/family education    OT Goals(Current goals can be found in the care plan section) Acute Rehab OT Goals Patient  Stated Goal: none stated OT Goal Formulation: Patient unable to participate in goal setting Time For Goal Achievement: 05/03/21 Potential to Achieve Goals: Good  OT Frequency: Min 2X/week   Barriers to D/C:             Co-evaluation PT/OT/SLP Co-Evaluation/Treatment: Yes Reason for Co-Treatment: Necessary to address cognition/behavior during functional activity;For patient/therapist safety;To address functional/ADL transfers PT goals addressed during session: Mobility/safety with mobility;Balance OT goals addressed during session: ADL's and self-care      AM-PAC OT "6 Clicks" Daily Activity     Outcome Measure Help from another person eating meals?: Total Help from another person taking care of personal grooming?: Total Help from another person toileting, which includes using toliet, bedpan, or urinal?: Total Help from another person bathing (including washing, rinsing, drying)?: Total Help from another person to put on and taking off regular upper body clothing?: Total Help from another person to put on and taking off regular lower body clothing?: Total 6 Click Score: 6   End of Session Equipment Utilized During Treatment: Gait belt Nurse Communication: Mobility status;Other (comment) (cognition)  Activity Tolerance: Other (comment) (limited by cognition) Patient left: in bed;with bed alarm set;with call bell/phone within reach;with SCD's reapplied;Other (comment) (fall mats in place)  OT Visit Diagnosis: Unsteadiness on feet (R26.81);Other abnormalities of gait and mobility (R26.89);Muscle weakness (generalized) (M62.81);Other symptoms and signs involving cognitive function                Time: 4540-9811 OT Time Calculation (min): 24 min Charges:  OT General Charges $OT Visit: 1 Visit OT Evaluation $OT Eval Moderate Complexity: 1 Mod  Malachy Chamber, OTR/L Acute Rehab Services Office: [phone]   Layla Maw 04/19/2021, 10:36 AM

## 2021-04-19 NOTE — H&P (View-Only) (Signed)
Procedure(s) (LRB): STERNAL WOUND IRRIGATION & DEBRIDEMENT (N/A) APPLICATION OF WOUND VAC (N/A) Subjective: Awake and responds, incoherent  Objective: Vital signs in last 24 hours: Temp:  [98.2 F (36.8 C)-99.7 F (37.6 C)] 99.7 F (37.6 C) (11/07 1634) Pulse Rate:  [91-104] 99 (11/07 1800) Cardiac Rhythm: Atrial fibrillation (11/07 0734) Resp:  [16-25] 19 (11/07 1800) BP: (126-145)/(61-72) 138/72 (11/07 1634) SpO2:  [91 %-100 %] 95 % (11/07 1800)  Hemodynamic parameters for last 24 hours:    Intake/Output from previous day: 11/06 0701 - 11/07 0700 In: 1988.2 [I.V.:1808.4; IV Piggyback:179.8] Out: 700 [Urine:700] Intake/Output this shift: Total I/O In: 2141.8 [I.V.:1345.7; IV Piggyback:796.1] Out: 600 [Urine:600]  General appearance: delirious and distracted Neurologic: incoherent Wound: increased size of sternal fluid collection  Lab Results: Recent Labs    05/04/2021 0539 04/19/21 0256  WBC 11.6* 10.8*  HGB 8.9* 8.0*  HCT 27.3* 23.0*  PLT 296 352   BMET:  Recent Labs    04/17/2021 0539 04/19/21 0256  NA 132* 135  K 3.8 2.6*  CL 100 104  CO2 19* 21*  GLUCOSE 131* 129*  BUN 23 20  CREATININE 0.92 0.86  CALCIUM 8.9 9.0    PT/INR:  Recent Labs    04/17/21 0856  LABPROT 14.8  INR 1.2   ABG No results found for: PHART, HCO3, TCO2, ACIDBASEDEF, O2SAT CBG (last 3)  Recent Labs    04/17/21 2129  GLUCAP 126*    Assessment/Plan: S/P Procedure(s) (LRB): STERNAL WOUND IRRIGATION & DEBRIDEMENT (N/A) APPLICATION OF WOUND VAC (N/A) - I spoke with Mrs. Ribas by telephone. He has a sternal fracture with a phlegmon. The external component has increased in size. He needs an I & D of the sternum and possible VAC placement. Given his altered mental status there is very little chance we will be able to extubate him postoperatively. Likely will be on ventilator for days to weeks.   I informed her of the indications, risks, benefits and alternatives. She  understands the risks include but are not limited to death, bleeding, MI, DVT, PE, injury to underlying structures and failure to extubate as well as the possibility of other unforeseeable complications.  She gives consent to proceed.  Plan debridement in OR in AM   LOS: 1 day    Melrose Nakayama 04/19/2021

## 2021-04-19 NOTE — Plan of Care (Signed)
  Problem: Clinical Measurements: Goal: Will remain free from infection Outcome: Progressing Goal: Diagnostic test results will improve Outcome: Progressing Goal: Respiratory complications will improve Outcome: Progressing Goal: Cardiovascular complication will be avoided Outcome: Progressing   

## 2021-04-19 NOTE — Progress Notes (Signed)
[facility] Infectious Diseases Follow Up Note  Patient Identification: Patient Name: [name] MRN: [id number] [place] Date: 05/03/2021  4:57 AM Age: 85 y.o.Today's Date: 04/19/2021   Reason for Visit: MRSA bacteremia  Principal Problem:   Sepsis due to methicillin resistant Staphylococcus aureus (MRSA) ([place]) Active Problems:   History of prostate cancer   Essential hypertension   CAD (coronary artery disease)   Hyperlipidemia, mixed   Lumbar compression fracture (HCC)   Atrial fibrillation with RVR (HCC)   Sternal wound infection   SDH (subdural hematoma)   Pneumonia involving right lung   Delirium   DNR (do not resuscitate)   Goals of care, counseling/discussion   Antibiotics: Vancomycin 11/5-current                     Zosyn 11/5-current   Lines/Hardwares:   Interval Events: afebrile, K 2.6, WBC stable at 10.8  Assessment MRSA bacteremia  Recent Falls with SDH (Left convexity subacute and Rt sided chronic) with concerns of acute infarct and hydrocephalus per NeuroSx- no surgical intervention per Neuro SX Sternal fracture with phlegmon Encephalopathy - he is disoriented. Does not follow commands  Possible Pulmonary septic emboli  Thoraco-Lumbar compression fractures vs Osteomyelitis - No surgical intervention per NeuroSX Left hip bruise/hematoma    Recommendations Continue Vancomycin, he will need a prolonged course of antibiotics  Repeat blood cx ( ordered ) Fu TTE results  4.   Fu CT surgery recs for possible need of surgical intervention ' 5.   Continue GOC discussion with the family 6.   Following   Rest of the management as per the primary team. Thank you for the consult. Please page with pertinent questions or concerns.  ______________________________________________________________________ Subjective patient seen and examined at the bedside. Spoke with his nurse at bedside. He is mumbling,  disoriented and does not follow commands.  Vitals BP 129/61 (BP Location: Left Arm)   Pulse 97   Temp 98.2 F (36.8 C) (Oral)   Resp 19   SpO2 98%     Physical Exam Constitutional:  Sitting up in bed     Comments:   Cardiovascular:     Rate and Rhythm: Normal rate and regular rhythm.     Heart sounds:  Pulmonary:     Effort: Pulmonary effort is normal.     Comments:   Abdominal:     Palpations: Abdomen is soft.     Tenderness: Non tender and non distended   Musculoskeletal:        General: Left hip bruises  Skin:    Comments:   Neurological:     General: unable to assess  Psychiatric:        Mood and Affect: disoriented   Pertinent Microbiology Results for orders placed or performed during the hospital encounter of 04/17/21  Resp Panel by RT-PCR (Flu A&B, Covid) Nasopharyngeal Swab     Status: None   Collection Time: 04/17/21  8:56 AM   Specimen: Nasopharyngeal Swab; Nasopharyngeal(NP) swabs in vial transport medium  Result Value Ref Range Status   SARS Coronavirus 2 by RT PCR NEGATIVE NEGATIVE Final    Comment: (NOTE) SARS-CoV-2 target nucleic acids are NOT DETECTED.  The SARS-CoV-2 RNA is generally detectable in upper respiratory specimens during the acute phase of infection. The lowest concentration of SARS-CoV-2 viral copies this assay can detect is 138 copies/mL. A negative result does not preclude SARS-Cov-2 infection and should not be used as the sole basis for treatment or other  patient management decisions. A negative result may occur with  improper specimen collection/handling, submission of specimen other than nasopharyngeal swab, presence of viral mutation(s) within the areas targeted by this assay, and inadequate number of viral copies(<138 copies/mL). A negative result must be combined with clinical observations, patient history, and epidemiological information. The expected result is Negative.  Fact Sheet for Patients:   EntrepreneurPulse.com.au  Fact Sheet for Healthcare Providers:  IncredibleEmployment.be  This test is no t yet approved or cleared by the [place] FDA and  has been authorized for detection and/or diagnosis of SARS-CoV-2 by FDA under an Emergency Use Authorization (EUA). This EUA will remain  in effect (meaning this test can be used) for the duration of the COVID-19 declaration under Section 564(b)(1) of the Act, 21 U.S.C.section 360bbb-3(b)(1), unless the authorization is terminated  or revoked sooner.       Influenza A by PCR NEGATIVE NEGATIVE Final   Influenza B by PCR NEGATIVE NEGATIVE Final    Comment: (NOTE) The Xpert Xpress SARS-CoV-2/FLU/RSV plus assay is intended as an aid in the diagnosis of influenza from Nasopharyngeal swab specimens and should not be used as a sole basis for treatment. Nasal washings and aspirates are unacceptable for Xpert Xpress SARS-CoV-2/FLU/RSV testing.  Fact Sheet for Patients: EntrepreneurPulse.com.au  Fact Sheet for Healthcare Providers: IncredibleEmployment.be  This test is not yet approved or cleared by the [place] FDA and has been authorized for detection and/or diagnosis of SARS-CoV-2 by FDA under an Emergency Use Authorization (EUA). This EUA will remain in effect (meaning this test can be used) for the duration of the COVID-19 declaration under Section 564(b)(1) of the Act, 21 U.S.C. section 360bbb-3(b)(1), unless the authorization is terminated or revoked.  Performed at [facility], [address] [place], [place] [address]   Culture, blood (Routine x 2)     Status: None (Preliminary result)   Collection Time: 04/17/21  9:20 AM   Specimen: BLOOD  Result Value Ref Range Status   Specimen Description BLOOD RIGHT FA  Final   Special Requests   Final    BOTTLES DRAWN AEROBIC AND ANAEROBIC Blood Culture adequate volume   Culture  Setup  Time   Final    GRAM POSITIVE COCCI IN BOTH AEROBIC AND ANAEROBIC BOTTLES Organism ID to follow CRITICAL RESULT CALLED TO, READ BACK BY AND VERIFIED WITH: SAM RAUER 2203 04/17/21 LFD Performed at [facility], [address] [place], [place] [address]    Culture [facility] POSITIVE COCCI  Final   Report Status PENDING  Incomplete  Blood Culture ID Panel (Reflexed)     Status: Abnormal   Collection Time: 04/17/21  9:20 AM  Result Value Ref Range Status   Enterococcus faecalis NOT DETECTED NOT DETECTED Final   Enterococcus Faecium NOT DETECTED NOT DETECTED Final   Listeria monocytogenes NOT DETECTED NOT DETECTED Final   Staphylococcus species DETECTED (A) NOT DETECTED Final    Comment: CRITICAL RESULT CALLED TO, READ BACK BY AND VERIFIED WITH: SAM RAUER 2203 04/17/21 LFD    Staphylococcus aureus (BCID) DETECTED (A) NOT DETECTED Final    Comment: Methicillin (oxacillin)-resistant Staphylococcus aureus (MRSA). MRSA is predictably resistant to beta-lactam antibiotics (except ceftaroline). Preferred therapy is vancomycin unless clinically contraindicated. Patient requires contact precautions if  hospitalized. CRITICAL RESULT CALLED TO, READ BACK BY AND VERIFIED WITH: SAM RAUER 2203 04/17/21 LFD    Staphylococcus epidermidis NOT DETECTED NOT DETECTED Final   Staphylococcus lugdunensis NOT DETECTED NOT DETECTED Final   Streptococcus species  NOT DETECTED NOT DETECTED Final   Streptococcus agalactiae NOT DETECTED NOT DETECTED Final   Streptococcus pneumoniae NOT DETECTED NOT DETECTED Final   Streptococcus pyogenes NOT DETECTED NOT DETECTED Final   A.calcoaceticus-baumannii NOT DETECTED NOT DETECTED Final   Bacteroides fragilis NOT DETECTED NOT DETECTED Final   Enterobacterales NOT DETECTED NOT DETECTED Final   Enterobacter cloacae complex NOT DETECTED NOT DETECTED Final   Escherichia coli NOT DETECTED NOT DETECTED Final   Klebsiella aerogenes NOT DETECTED NOT DETECTED Final    Klebsiella oxytoca NOT DETECTED NOT DETECTED Final   Klebsiella pneumoniae NOT DETECTED NOT DETECTED Final   Proteus species NOT DETECTED NOT DETECTED Final   Salmonella species NOT DETECTED NOT DETECTED Final   Serratia marcescens NOT DETECTED NOT DETECTED Final   Haemophilus influenzae NOT DETECTED NOT DETECTED Final   Neisseria meningitidis NOT DETECTED NOT DETECTED Final   Pseudomonas aeruginosa NOT DETECTED NOT DETECTED Final   Stenotrophomonas maltophilia NOT DETECTED NOT DETECTED Final   Candida albicans NOT DETECTED NOT DETECTED Final   Candida auris NOT DETECTED NOT DETECTED Final   Candida glabrata NOT DETECTED NOT DETECTED Final   Candida krusei NOT DETECTED NOT DETECTED Final   Candida parapsilosis NOT DETECTED NOT DETECTED Final   Candida tropicalis NOT DETECTED NOT DETECTED Final   Cryptococcus neoformans/gattii NOT DETECTED NOT DETECTED Final   Meth resistant mecA/C and MREJ DETECTED (A) NOT DETECTED Final    Comment: CRITICAL RESULT CALLED TO, READ BACK BY AND VERIFIED WITH: SAM RAUER 2203 04/17/21 LFD Performed at [facility], [address] [place], [place] [address]   Culture, blood (Routine x 2)     Status: Abnormal (Preliminary result)   Collection Time: 04/17/21  9:47 AM   Specimen: BLOOD  Result Value Ref Range Status   Specimen Description   Final    BLOOD RIGHT ARM Performed at [facility], [address] [place], [place] [address]    Special Requests   Final    BOTTLES DRAWN AEROBIC AND ANAEROBIC Blood Culture results may not be optimal due to an excessive volume of blood received in culture bottles Performed at [facility], [address] [place], [place] [address]    Culture  Setup Time   Final    GRAM POSITIVE COCCI IN BOTH AEROBIC AND ANAEROBIC BOTTLES CRITICAL VALUE NOTED.  VALUE IS CONSISTENT WITH PREVIOUSLY REPORTED AND CALLED VALUE. Performed at [facility], [address] [place], [place]  [address]    Culture (A)  Final    STAPHYLOCOCCUS AUREUS CULTURE REINCUBATED FOR BETTER GROWTH Performed at [facility], [address], [place], [place] [address]    Report Status PENDING  Incomplete  Urine Culture     Status: Abnormal   Collection Time: 04/17/21 11:58 AM   Specimen: Urine, Random  Result Value Ref Range Status   Specimen Description   Final    URINE, RANDOM Performed at [facility], [address], [place], [place] [address]    Special Requests   Final    NONE Performed at [facility], [address] [place],  [address]    Culture MULTIPLE SPECIES PRESENT, SUGGEST RECOLLECTION (A)  Final   Report Status 04/19/2021 FINAL  Final    Pertinent Lab. CBC Latest Ref Rng & Units 04/19/2021 04/25/2021 04/17/2021  WBC 4.0 - 10.5 K/uL 10.8(H) 11.6(H) 10.8(H)  Hemoglobin 13.0 - 17.0 g/dL 8.0(L) 8.9(L) 9.9(L)  Hematocrit 39.0 - 52.0 % 23.0(L) 27.3(L) 29.3(L)  Platelets 150 - 400 K/uL 352 296 303   CMP Latest Ref Rng & Units 04/19/2021 05/04/2021 04/17/2021  Glucose 70 - 99 mg/dL 129(H) 131(H) 126(H)  BUN 8 - 23 mg/dL 20 23 30(H)  Creatinine 0.61 - 1.24 mg/dL 0.86 0.92 1.07  Sodium 135 - 145 mmol/L 135 132(L) 127(L)  Potassium 3.5 - 5.1 mmol/L 2.6(LL) 3.8 4.9  Chloride 98 - 111 mmol/L 104 100 92(L)  CO2 22 - 32 mmol/L 21(L) 19(L) 22  Calcium 8.9 - 10.3 mg/dL 9.0 8.9 9.1  Total Protein 6.5 - 8.1 g/dL - 6.0(L) 6.8  Total Bilirubin 0.3 - 1.2 mg/dL - 1.3(H) 1.1  Alkaline Phos 38 - 126 U/L - 54 63  AST 15 - 41 U/L - 43(H) 30  ALT 0 - 44 U/L - 24 20     Pertinent Imaging today Plain films and CT images have been personally visualized and interpreted; radiology reports have been reviewed. Decision making incorporated into the Impression / Recommendations. CT HEAD WO CONTRAST (5MM)  Result Date: 04/17/2021 CLINICAL DATA:  Mental status change, unknown cause. Sepsis. Recent fall. EXAM: CT HEAD WITHOUT CONTRAST TECHNIQUE: Contiguous axial  images were obtained from the base of the skull through the vertex without intravenous contrast. COMPARISON:  Report of 04/10/2021 head CT from [facility] FINDINGS: Brain: A small mixed density subdural hematoma over the left cerebral convexity measures up to 5 mm in thickness (4 mm thickness reported on the recent outside CT). A small subdural fluid collection over the right cerebral convexity measures up to 1.3 cm in thickness in the frontal region and is diffusely hypoattenuating with density similar to only slightly greater than that of CSF; this was not described in the recent outside report. There is associated right frontal gyral flattening without midline shift or other significant mass effect. Hypodensities in the cerebral white matter bilaterally are nonspecific but compatible with mild-to-moderate chronic small vessel ischemic disease. There is mild cerebral atrophy. No acute infarct, parenchymal hemorrhage, subarachnoid hemorrhage, or mass is identified. Vascular: Calcified atherosclerosis at the skull base. No hyperdense vessel. Skull: No fracture or suspicious osseous lesion. Sinuses/Orbits: Mild mucosal thickening or small volume fluid in the left sphenoid sinus. Visualized mastoid air cells are clear. Unremarkable orbits. Other: Mild left occipital scalp swelling with skin staples in place. IMPRESSION: 1. Small mixed density subdural hematoma over the left cerebral convexity, similar in size to that described on the recent outside CT. 2. Small low-density subdural collection in the right frontal region suggestive of a subdural hygroma and potentially new. No midline shift. 3. Mild-to-moderate chronic small vessel ischemic disease. Electronically Signed   By: Logan Bores M.D.   On: 04/17/2021 11:38   CT Angio Chest PE W and/or Wo Contrast  Result Date: 04/17/2021 CLINICAL DATA:  PE suspected. Sepsis. EXAM: CT ANGIOGRAPHY CHEST CT ABDOMEN AND PELVIS WITH CONTRAST TECHNIQUE: Multidetector CT imaging of  the chest was performed using the standard protocol during bolus administration of intravenous contrast. Multiplanar CT image reconstructions and MIPs were obtained to evaluate the vascular anatomy. Multidetector CT imaging of the abdomen and pelvis was performed using the standard protocol during bolus administration of intravenous contrast. CONTRAST:  164mL OMNIPAQUE IOHEXOL 350 MG/ML SOLN COMPARISON:  None. FINDINGS: CTA CHEST FINDINGS Cardiovascular: Suboptimal visualization of the segmental pulmonary arteries in the lower thorax due to streak artifact. Accounting for this, no evidence of pulmonary embolus. Prior CABG. Normal heart size. Minimal pericardial effusion. Mediastinum/Nodes: No enlarged mediastinal, hilar, or axillary lymph nodes. Thyroid  gland is normal. There is polypoid soft tissue density opacification along the right hand side of the proximal trachea. The main bronchi are patent. There is a high density gas and fluid collection in the anterior mediastinum, posterior to the sternum measuring 5.6 x 3.1 cm. Curvilinear right opacification within this collection suggests possible active extravasation. There is an associated gas and fluid collection anterior to the sternum, at the level of the third median sternotomy wire. There is a transverse fracture of the body of the sternum, likely acute and causing the above-mentioned collections. Lungs/Pleura: There are small bilateral pleural effusions with bibasilar atelectasis. Peripheral nodular opacities are seen in the left upper lobe and right upper lobe with prominent cavitation within the left upper lobe opacity. The findings are suggestive of septic emboli. Small nodular opacities are seen in the left lung apex. Musculoskeletal: Transverse fracture of the manubrium. Age indeterminate compression fracture of T7 vertebral body with approximately 30% height loss. Review of the MIP images confirms the above findings. CT ABDOMEN and PELVIS FINDINGS  Hepatobiliary: No focal liver abnormality is seen. No gallstones, gallbladder wall thickening, or biliary dilatation. Pancreas: Unremarkable. No pancreatic ductal dilatation or surrounding inflammatory changes. Spleen: Normal in size without focal abnormality. Adrenals/Urinary Tract: Adrenal glands are unremarkable. Kidneys are normal, without renal calculi, focal lesion, or hydronephrosis. Bladder is decompressed around urinary Foley. Diffuse irregular urinary bladder wall thickening. Stomach/Bowel: Moderate hiatal hernia. No evidence of small-bowel obstruction. Large amount of formed stool throughout the colon. Vascular/Lymphatic: Aortic atherosclerosis. No grossly enlarged abdominal or pelvic lymph nodes. Shotty retroperitoneal lymph nodes. Reproductive: Prior prostatectomy. Other: Fat containing periumbilical anterior abdominal wall hernia. Musculoskeletal: Age indeterminate severe compression fracture of L1 vertebral body with approximately 80% height loss, and 9 mm retropulsion. Age-indeterminate transverse fracture of T12 vertebral body, with approximately 10% height loss. 2-3 mm of retropulsion to the spinal canal. Soft tissue and small amount of gas are seen surrounding the fracture line. The effect of the soft tissue on the spinal cord cannot be well evaluated. Review of the MIP images confirms the above findings. IMPRESSION: 1. Suboptimal visualization of the segmental pulmonary arteries in the lower thorax due to streak artifact. Accounting for this, no evidence of pulmonary embolus. 2. 5.6 x 3.1 cm high density gas and fluid collection in the anterior mediastinum, posterior to the sternum. Curvilinear bright opacification within this collection suggests possible active extravasation. 3. Transverse fracture of the body of the sternum, likely acute and causing the above-mentioned collections. 4 mm posterior displacement of the superior fracture line. 4. Small bilateral pleural effusions with bibasilar  atelectasis. 5. Peripheral nodular opacities in the left upper lobe and right upper lobe with prominent cavitation within the left upper lobe opacity. The findings are suggestive of septic emboli. 6. Polypoid soft tissue density opacification along the hand side of the proximal trachea. This may represent adherent secretions, however, endobronchial lesion cannot be excluded. 7. Age indeterminate compression fractures of T7, L1 and T12 vertebral bodies. 9 mm retropulsion at the L1 fracture line. Indistinct fracture line and soft tissue surrounding the T12 vertebral body fracture raises the question about possible pathologic fracture with questionable osteomyelitis. The effect on the spinal cord from these fractures cannot be well evaluated. If there is clinical concern for neurologic symptoms, consider evaluation with contrast enhanced MRI. 8. Large amount of formed stool throughout the colon, query constipation. 9. Diffuse irregular thickening of the urinary bladder wall, not well visualized due to its decompressed state. 10. Fat containing periumbilical  anterior abdominal wall hernia. 11. Prior prostatectomy. 12. Aortic atherosclerosis. Aortic Atherosclerosis (ICD10-I70.0). These results were called by telephone at the time of interpretation on 04/17/2021 at 1:00 pm to provider [facility] , who verbally acknowledged these results. Electronically Signed   By: Fidela Salisbury M.D.   On: 04/17/2021 13:14   CT ABDOMEN PELVIS W CONTRAST  Result Date: 04/17/2021 CLINICAL DATA:  PE suspected. Sepsis. EXAM: CT ANGIOGRAPHY CHEST CT ABDOMEN AND PELVIS WITH CONTRAST TECHNIQUE: Multidetector CT imaging of the chest was performed using the standard protocol during bolus administration of intravenous contrast. Multiplanar CT image reconstructions and MIPs were obtained to evaluate the vascular anatomy. Multidetector CT imaging of the abdomen and pelvis was performed using the standard protocol during bolus  administration of intravenous contrast. CONTRAST:  174mL OMNIPAQUE IOHEXOL 350 MG/ML SOLN COMPARISON:  None. FINDINGS: CTA CHEST FINDINGS Cardiovascular: Suboptimal visualization of the segmental pulmonary arteries in the lower thorax due to streak artifact. Accounting for this, no evidence of pulmonary embolus. Prior CABG. Normal heart size. Minimal pericardial effusion. Mediastinum/Nodes: No enlarged mediastinal, hilar, or axillary lymph nodes. Thyroid gland is normal. There is polypoid soft tissue density opacification along the right hand side of the proximal trachea. The main bronchi are patent. There is a high density gas and fluid collection in the anterior mediastinum, posterior to the sternum measuring 5.6 x 3.1 cm. Curvilinear right opacification within this collection suggests possible active extravasation. There is an associated gas and fluid collection anterior to the sternum, at the level of the third median sternotomy wire. There is a transverse fracture of the body of the sternum, likely acute and causing the above-mentioned collections. Lungs/Pleura: There are small bilateral pleural effusions with bibasilar atelectasis. Peripheral nodular opacities are seen in the left upper lobe and right upper lobe with prominent cavitation within the left upper lobe opacity. The findings are suggestive of septic emboli. Small nodular opacities are seen in the left lung apex. Musculoskeletal: Transverse fracture of the manubrium. Age indeterminate compression fracture of T7 vertebral body with approximately 30% height loss. Review of the MIP images confirms the above findings. CT ABDOMEN and PELVIS FINDINGS Hepatobiliary: No focal liver abnormality is seen. No gallstones, gallbladder wall thickening, or biliary dilatation. Pancreas: Unremarkable. No pancreatic ductal dilatation or surrounding inflammatory changes. Spleen: Normal in size without focal abnormality. Adrenals/Urinary Tract: Adrenal glands are  unremarkable. Kidneys are normal, without renal calculi, focal lesion, or hydronephrosis. Bladder is decompressed around urinary Foley. Diffuse irregular urinary bladder wall thickening. Stomach/Bowel: Moderate hiatal hernia. No evidence of small-bowel obstruction. Large amount of formed stool throughout the colon. Vascular/Lymphatic: Aortic atherosclerosis. No grossly enlarged abdominal or pelvic lymph nodes. Shotty retroperitoneal lymph nodes. Reproductive: Prior prostatectomy. Other: Fat containing periumbilical anterior abdominal wall hernia. Musculoskeletal: Age indeterminate severe compression fracture of L1 vertebral body with approximately 80% height loss, and 9 mm retropulsion. Age-indeterminate transverse fracture of T12 vertebral body, with approximately 10% height loss. 2-3 mm of retropulsion to the spinal canal. Soft tissue and small amount of gas are seen surrounding the fracture line. The effect of the soft tissue on the spinal cord cannot be well evaluated. Review of the MIP images confirms the above findings. IMPRESSION: 1. Suboptimal visualization of the segmental pulmonary arteries in the lower thorax due to streak artifact. Accounting for this, no evidence of pulmonary embolus. 2. 5.6 x 3.1 cm high density gas and fluid collection in the anterior mediastinum, posterior to the sternum. Curvilinear bright opacification within this collection suggests possible active  extravasation. 3. Transverse fracture of the body of the sternum, likely acute and causing the above-mentioned collections. 4 mm posterior displacement of the superior fracture line. 4. Small bilateral pleural effusions with bibasilar atelectasis. 5. Peripheral nodular opacities in the left upper lobe and right upper lobe with prominent cavitation within the left upper lobe opacity. The findings are suggestive of septic emboli. 6. Polypoid soft tissue density opacification along the hand side of the proximal trachea. This may represent  adherent secretions, however, endobronchial lesion cannot be excluded. 7. Age indeterminate compression fractures of T7, L1 and T12 vertebral bodies. 9 mm retropulsion at the L1 fracture line. Indistinct fracture line and soft tissue surrounding the T12 vertebral body fracture raises the question about possible pathologic fracture with questionable osteomyelitis. The effect on the spinal cord from these fractures cannot be well evaluated. If there is clinical concern for neurologic symptoms, consider evaluation with contrast enhanced MRI. 8. Large amount of formed stool throughout the colon, query constipation. 9. Diffuse irregular thickening of the urinary bladder wall, not well visualized due to its decompressed state. 10. Fat containing periumbilical anterior abdominal wall hernia. 11. Prior prostatectomy. 12. Aortic atherosclerosis. Aortic Atherosclerosis (ICD10-I70.0). These results were called by telephone at the time of interpretation on 04/17/2021 at 1:00 pm to provider [facility] , who verbally acknowledged these results. Electronically Signed   By: Fidela Salisbury M.D.   On: 04/17/2021 13:14   US Venous Img Upper Uni Left (DVT)  Result Date: 04/17/2021 CLINICAL DATA:  Left upper extremity swelling EXAM: LEFT UPPER EXTREMITY VENOUS DOPPLER ULTRASOUND TECHNIQUE: Gray-scale sonography with graded compression, as well as color Doppler and duplex ultrasound were performed to evaluate the upper extremity deep venous system from the level of the subclavian vein and including the jugular, axillary, basilic, radial, ulnar and upper cephalic vein. Spectral Doppler was utilized to evaluate flow at rest and with distal augmentation maneuvers. COMPARISON:  None. FINDINGS: Contralateral Subclavian Vein: Respiratory phasicity is normal and symmetric with the symptomatic side. No evidence of thrombus. Normal compressibility. Internal Jugular Vein: No evidence of thrombus. Normal compressibility, respiratory  phasicity and response to augmentation. Subclavian Vein: No evidence of thrombus. Normal compressibility, respiratory phasicity and response to augmentation. Axillary Vein: No evidence of thrombus. Normal compressibility, respiratory phasicity and response to augmentation. Cephalic Vein: Acute expansile near occlusive superficial thrombophlebitis of the distal left cephalic vein in the region of the antecubital fossa at a former IV site. Proximal and mid left cephalic vein are patent and compressible. Basilic Vein: No evidence of thrombus. Normal compressibility, respiratory phasicity and response to augmentation. Brachial Veins: No evidence of thrombus. Normal compressibility, respiratory phasicity and response to augmentation. Radial Veins: No evidence of thrombus. Normal compressibility, respiratory phasicity and response to augmentation. Ulnar Veins: No evidence of thrombus. Normal compressibility, respiratory phasicity and response to augmentation. Venous Reflux:  None visualized. Other Findings:  None visualized. IMPRESSION: No evidence of deep venous thrombosis within the left upper extremity. Acute near occlusive superficial thrombophlebitis of the distal left cephalic vein in the region of the antecubital fossa. Electronically Signed   By: Ilona Sorrel M.D.   On: 04/17/2021 11:20   DG Chest Portable 1 View  Result Date: 04/17/2021 CLINICAL DATA:  Status post central line placement EXAM: PORTABLE CHEST 1 VIEW COMPARISON:  Film from earlier in the same day. FINDINGS: New left jugular central line is noted with the catheter tip at the cavoatrial junction. No pneumothorax is noted. Cardiac shadow is stable. Postsurgical changes are  again noted. Patchy airspace disease is again noted in the right mid lung. No new focal abnormality is noted. IMPRESSION: No pneumothorax following central line placement on the left. Electronically Signed   By: Inez Catalina M.D.   On: 04/17/2021 21:53   DG Chest Portable 1  View  Result Date: 04/17/2021 CLINICAL DATA:  Sepsis.  Hypoxia. EXAM: PORTABLE CHEST 1 VIEW COMPARISON:  08/17/2020 FINDINGS: Stable mild cardiomegaly. Prior CABG again noted. New ill-defined airspace opacity is seen in the right midlung. Left lung is clear. No evidence of pleural effusion. IMPRESSION: New ill-defined airspace opacity in right midlung. Electronically Signed   By: Marlaine Hind M.D.   On: 04/17/2021 11:47     I spent more than 45 minutes for this patient encounter including review of prior medical records, coordination of care  with greater than 50% of time being face to face/counseling and discussing diagnostics/treatment plan with the patient/family.  Electronically signed by:   Rosiland Oz, MD Infectious Disease Physician [facility] Pager: [phone]

## 2021-04-19 NOTE — Progress Notes (Signed)
Procedure(s) (LRB): STERNAL WOUND IRRIGATION & DEBRIDEMENT (N/A) APPLICATION OF WOUND VAC (N/A) Subjective: Awake and responds, incoherent  Objective: Vital signs in last 24 hours: Temp:  [98.2 F (36.8 C)-99.7 F (37.6 C)] 99.7 F (37.6 C) (11/07 1634) Pulse Rate:  [91-104] 99 (11/07 1800) Cardiac Rhythm: Atrial fibrillation (11/07 0734) Resp:  [16-25] 19 (11/07 1800) BP: (126-145)/(61-72) 138/72 (11/07 1634) SpO2:  [91 %-100 %] 95 % (11/07 1800)  Hemodynamic parameters for last 24 hours:    Intake/Output from previous day: 11/06 0701 - 11/07 0700 In: 1988.2 [I.V.:1808.4; IV Piggyback:179.8] Out: 700 [Urine:700] Intake/Output this shift: Total I/O In: 2141.8 [I.V.:1345.7; IV Piggyback:796.1] Out: 600 [Urine:600]  General appearance: delirious and distracted Neurologic: incoherent Wound: increased size of sternal fluid collection  Lab Results: Recent Labs    04/14/2021 0539 04/19/21 0256  WBC 11.6* 10.8*  HGB 8.9* 8.0*  HCT 27.3* 23.0*  PLT 296 352   BMET:  Recent Labs    04/29/2021 0539 04/19/21 0256  NA 132* 135  K 3.8 2.6*  CL 100 104  CO2 19* 21*  GLUCOSE 131* 129*  BUN 23 20  CREATININE 0.92 0.86  CALCIUM 8.9 9.0    PT/INR:  Recent Labs    04/17/21 0856  LABPROT 14.8  INR 1.2   ABG No results found for: PHART, HCO3, TCO2, ACIDBASEDEF, O2SAT CBG (last 3)  Recent Labs    04/17/21 2129  GLUCAP 126*    Assessment/Plan: S/P Procedure(s) (LRB): STERNAL WOUND IRRIGATION & DEBRIDEMENT (N/A) APPLICATION OF WOUND VAC (N/A) - I spoke with Mrs. Lieurance by telephone. He has a sternal fracture with a phlegmon. The external component has increased in size. He needs an I & D of the sternum and possible VAC placement. Given his altered mental status there is very little chance we will be able to extubate him postoperatively. Likely will be on ventilator for days to weeks.   I informed her of the indications, risks, benefits and alternatives. She  understands the risks include but are not limited to death, bleeding, MI, DVT, PE, injury to underlying structures and failure to extubate as well as the possibility of other unforeseeable complications.  She gives consent to proceed.  Plan debridement in OR in AM   LOS: 1 day    Melrose Nakayama 04/19/2021

## 2021-04-20 ENCOUNTER — Inpatient Hospital Stay (HOSPITAL_COMMUNITY): Payer: Medicare Other | Admitting: Certified Registered"

## 2021-04-20 ENCOUNTER — Encounter (HOSPITAL_COMMUNITY): Admission: AD | Disposition: E | Payer: Self-pay | Attending: Internal Medicine

## 2021-04-20 ENCOUNTER — Other Ambulatory Visit: Payer: Self-pay

## 2021-04-20 DIAGNOSIS — S065XAA Traumatic subdural hemorrhage with loss of consciousness status unknown, initial encounter: Secondary | ICD-10-CM | POA: Diagnosis not present

## 2021-04-20 DIAGNOSIS — M4624 Osteomyelitis of vertebra, thoracic region: Secondary | ICD-10-CM | POA: Diagnosis not present

## 2021-04-20 DIAGNOSIS — B9562 Methicillin resistant Staphylococcus aureus infection as the cause of diseases classified elsewhere: Secondary | ICD-10-CM | POA: Diagnosis not present

## 2021-04-20 DIAGNOSIS — L02213 Cutaneous abscess of chest wall: Secondary | ICD-10-CM

## 2021-04-20 DIAGNOSIS — R7881 Bacteremia: Secondary | ICD-10-CM | POA: Diagnosis not present

## 2021-04-20 DIAGNOSIS — S21101A Unspecified open wound of right front wall of thorax without penetration into thoracic cavity, initial encounter: Secondary | ICD-10-CM | POA: Diagnosis not present

## 2021-04-20 DIAGNOSIS — R410 Disorientation, unspecified: Secondary | ICD-10-CM | POA: Diagnosis not present

## 2021-04-20 DIAGNOSIS — I4891 Unspecified atrial fibrillation: Secondary | ICD-10-CM | POA: Diagnosis not present

## 2021-04-20 DIAGNOSIS — Z7189 Other specified counseling: Secondary | ICD-10-CM | POA: Diagnosis not present

## 2021-04-20 DIAGNOSIS — A4102 Sepsis due to Methicillin resistant Staphylococcus aureus: Secondary | ICD-10-CM | POA: Diagnosis not present

## 2021-04-20 HISTORY — PX: APPLICATION OF WOUND VAC: SHX5189

## 2021-04-20 HISTORY — PX: STERNAL WOUND DEBRIDEMENT: SHX1058

## 2021-04-20 LAB — BASIC METABOLIC PANEL
Anion gap: 10 (ref 5–15)
BUN: 19 mg/dL (ref 8–23)
CO2: 20 mmol/L — ABNORMAL LOW (ref 22–32)
Calcium: 9.7 mg/dL (ref 8.9–10.3)
Chloride: 109 mmol/L (ref 98–111)
Creatinine, Ser: 0.88 mg/dL (ref 0.61–1.24)
GFR, Estimated: >60 mL/min (ref >60)
Glucose, Bld: 128 mg/dL — ABNORMAL HIGH (ref 70–99)
Potassium: 2.8 mmol/L — ABNORMAL LOW (ref 3.5–5.1)
Sodium: 139 mmol/L (ref 135–145)

## 2021-04-20 LAB — CULTURE, BLOOD (ROUTINE X 2): Special Requests: ADEQUATE

## 2021-04-20 LAB — CBC
HCT: 22.8 % — ABNORMAL LOW (ref 39.0–52.0)
Hemoglobin: 8 g/dL — ABNORMAL LOW (ref 13.0–17.0)
MCH: 34.6 pg — ABNORMAL HIGH (ref 26.0–34.0)
MCHC: 35.1 g/dL (ref 30.0–36.0)
MCV: 98.7 fL (ref 80.0–100.0)
Platelets: 420 10*3/uL — ABNORMAL HIGH (ref 150–400)
RBC: 2.31 MIL/uL — ABNORMAL LOW (ref 4.22–5.81)
RDW: 14.2 % (ref 11.5–15.5)
WBC: 13 10*3/uL — ABNORMAL HIGH (ref 4.0–10.5)
nRBC: 0.2 % (ref 0.0–0.2)

## 2021-04-20 LAB — POTASSIUM: Potassium: 3.8 mmol/L (ref 3.5–5.1)

## 2021-04-20 LAB — MAGNESIUM: Magnesium: 1.8 mg/dL (ref 1.7–2.4)

## 2021-04-20 LAB — SEDIMENTATION RATE: Sed Rate: >140 mm/hr — ABNORMAL HIGH (ref 0–16)

## 2021-04-20 LAB — C-REACTIVE PROTEIN: CRP: 24.1 mg/dL — ABNORMAL HIGH (ref <1.0)

## 2021-04-20 LAB — PROCALCITONIN: Procalcitonin: 1.36 ng/mL

## 2021-04-20 SURGERY — DEBRIDEMENT, WOUND, STERNUM
Anesthesia: General

## 2021-04-20 MED ORDER — ROCURONIUM BROMIDE 10 MG/ML (PF) SYRINGE
PREFILLED_SYRINGE | INTRAVENOUS | Status: DC | PRN
  Administered 2021-04-20: 30 mg via INTRAVENOUS

## 2021-04-20 MED ORDER — PHENYLEPHRINE 40 MCG/ML (10ML) SYRINGE FOR IV PUSH (FOR BLOOD PRESSURE SUPPORT)
PREFILLED_SYRINGE | INTRAVENOUS | Status: AC
  Filled 2021-04-20: qty 10

## 2021-04-20 MED ORDER — IPRATROPIUM-ALBUTEROL 0.5-2.5 (3) MG/3ML IN SOLN
3.0000 mL | Freq: Once | RESPIRATORY_TRACT | Status: AC
  Administered 2021-04-20: 3 mL via RESPIRATORY_TRACT

## 2021-04-20 MED ORDER — IPRATROPIUM-ALBUTEROL 0.5-2.5 (3) MG/3ML IN SOLN
RESPIRATORY_TRACT | Status: AC
  Filled 2021-04-20: qty 3

## 2021-04-20 MED ORDER — FENTANYL CITRATE (PF) 100 MCG/2ML IJ SOLN: 25.0000 ug | INTRAMUSCULAR | Status: DC | PRN

## 2021-04-20 MED ORDER — SUGAMMADEX SODIUM 200 MG/2ML IV SOLN
INTRAVENOUS | Status: DC | PRN
  Administered 2021-04-20: 200 mg via INTRAVENOUS

## 2021-04-20 MED ORDER — LIDOCAINE 2% (20 MG/ML) 5 ML SYRINGE
INTRAMUSCULAR | Status: DC | PRN
  Administered 2021-04-20: 100 mg via INTRAVENOUS

## 2021-04-20 MED ORDER — MORPHINE SULFATE (PF) 2 MG/ML IV SOLN
2.0000 mg | INTRAVENOUS | Status: DC | PRN
  Administered 2021-04-26 – 2021-05-03 (×8): 2 mg via INTRAVENOUS
  Filled 2021-04-20 (×8): qty 1

## 2021-04-20 MED ORDER — ACETAMINOPHEN 500 MG PO TABS
1000.0000 mg | ORAL_TABLET | Freq: Four times a day (QID) | ORAL | Status: DC
  Administered 2021-04-21: 1000 mg via ORAL
  Filled 2021-04-20 (×3): qty 2

## 2021-04-20 MED ORDER — FENTANYL CITRATE (PF) 250 MCG/5ML IJ SOLN
INTRAMUSCULAR | Status: DC | PRN
  Administered 2021-04-20: 100 ug via INTRAVENOUS

## 2021-04-20 MED ORDER — DEXAMETHASONE SODIUM PHOSPHATE 10 MG/ML IJ SOLN
INTRAMUSCULAR | Status: DC | PRN
Start: 2021-04-20 — End: 2021-04-20
  Administered 2021-04-20: 5 mg via INTRAVENOUS

## 2021-04-20 MED ORDER — LIDOCAINE 2% (20 MG/ML) 5 ML SYRINGE
INTRAMUSCULAR | Status: AC
  Filled 2021-04-20: qty 5

## 2021-04-20 MED ORDER — ACETAMINOPHEN 10 MG/ML IV SOLN: 1000.0000 mg | Freq: Once | INTRAVENOUS | Status: DC | PRN

## 2021-04-20 MED ORDER — SUCCINYLCHOLINE CHLORIDE 200 MG/10ML IV SOSY
PREFILLED_SYRINGE | INTRAVENOUS | Status: DC | PRN
  Administered 2021-04-20: 100 mg via INTRAVENOUS

## 2021-04-20 MED ORDER — WHITE PETROLATUM EX OINT
TOPICAL_OINTMENT | CUTANEOUS | Status: AC
  Administered 2021-04-20: 0.2
  Filled 2021-04-20: qty 28.35

## 2021-04-20 MED ORDER — 0.9 % SODIUM CHLORIDE (POUR BTL) OPTIME
TOPICAL | Status: DC | PRN
  Administered 2021-04-20: 2000 mL

## 2021-04-20 MED ORDER — FENTANYL CITRATE (PF) 250 MCG/5ML IJ SOLN
INTRAMUSCULAR | Status: AC
  Filled 2021-04-20: qty 5

## 2021-04-20 MED ORDER — ONDANSETRON HCL 4 MG/2ML IJ SOLN
INTRAMUSCULAR | Status: DC | PRN
  Administered 2021-04-20: 4 mg via INTRAVENOUS

## 2021-04-20 MED ORDER — PHENYLEPHRINE HCL-NACL 20-0.9 MG/250ML-% IV SOLN
INTRAVENOUS | Status: DC | PRN
  Administered 2021-04-20: 40 ug/min via INTRAVENOUS

## 2021-04-20 MED ORDER — SUCCINYLCHOLINE CHLORIDE 200 MG/10ML IV SOSY
PREFILLED_SYRINGE | INTRAVENOUS | Status: AC
  Filled 2021-04-20: qty 10

## 2021-04-20 MED ORDER — ONDANSETRON HCL 4 MG/2ML IJ SOLN
INTRAMUSCULAR | Status: AC
  Filled 2021-04-20: qty 2

## 2021-04-20 MED ORDER — POTASSIUM CHLORIDE 10 MEQ/100ML IV SOLN
INTRAVENOUS | Status: AC
  Administered 2021-04-20: 10 meq
  Filled 2021-04-20: qty 100

## 2021-04-20 MED ORDER — POTASSIUM CHLORIDE 10 MEQ/100ML IV SOLN
INTRAVENOUS | Status: AC
  Filled 2021-04-20: qty 100

## 2021-04-20 MED ORDER — TRAMADOL HCL 50 MG PO TABS: 50.0000 mg | ORAL_TABLET | Freq: Four times a day (QID) | ORAL | Status: DC | PRN

## 2021-04-20 MED ORDER — ALBUTEROL SULFATE HFA 108 (90 BASE) MCG/ACT IN AERS
INHALATION_SPRAY | RESPIRATORY_TRACT | Status: AC
  Filled 2021-04-20: qty 6.7

## 2021-04-20 MED ORDER — ROCURONIUM BROMIDE 10 MG/ML (PF) SYRINGE
PREFILLED_SYRINGE | INTRAVENOUS | Status: AC
  Filled 2021-04-20: qty 10

## 2021-04-20 MED ORDER — PROPOFOL 10 MG/ML IV BOLUS
INTRAVENOUS | Status: AC
  Filled 2021-04-20: qty 20

## 2021-04-20 MED ORDER — LACTATED RINGERS IV SOLN: INTRAVENOUS | Status: DC | PRN

## 2021-04-20 MED ORDER — PROPOFOL 10 MG/ML IV BOLUS
INTRAVENOUS | Status: DC | PRN
  Administered 2021-04-20: 100 mg via INTRAVENOUS

## 2021-04-20 MED ORDER — POTASSIUM CHLORIDE 10 MEQ/100ML IV SOLN
10.0000 meq | INTRAVENOUS | Status: DC
  Administered 2021-04-20 (×3): 10 meq via INTRAVENOUS

## 2021-04-20 MED ORDER — DEXAMETHASONE SODIUM PHOSPHATE 10 MG/ML IJ SOLN
INTRAMUSCULAR | Status: AC
  Filled 2021-04-20: qty 1

## 2021-04-20 MED ORDER — PHENYLEPHRINE 40 MCG/ML (10ML) SYRINGE FOR IV PUSH (FOR BLOOD PRESSURE SUPPORT)
PREFILLED_SYRINGE | INTRAVENOUS | Status: DC | PRN
  Administered 2021-04-20: 80 ug via INTRAVENOUS

## 2021-04-20 MED ORDER — ALBUTEROL SULFATE HFA 108 (90 BASE) MCG/ACT IN AERS
INHALATION_SPRAY | RESPIRATORY_TRACT | Status: DC | PRN
  Administered 2021-04-20: 4 via RESPIRATORY_TRACT

## 2021-04-20 SURGICAL SUPPLY — 53 items
BLADE CLIPPER SURG (BLADE) ×2 IMPLANT
BLADE SURG 10 STRL SS (BLADE) ×2 IMPLANT
CANISTER SUCT 3000ML PPV (MISCELLANEOUS) ×2 IMPLANT
CNTNR URN SCR LID CUP LEK RST (MISCELLANEOUS) IMPLANT
CONT SPEC 4OZ STRL OR WHT (MISCELLANEOUS) ×1
COVER SURGICAL LIGHT HANDLE (MISCELLANEOUS) ×1 IMPLANT
DRAPE INCISE 23X17 IOBAN STRL (DRAPES) ×1
DRAPE INCISE 23X17 STRL (DRAPES) IMPLANT
DRAPE INCISE IOBAN 23X17 STRL (DRAPES) ×1 IMPLANT
DRAPE LAPAROSCOPIC ABDOMINAL (DRAPES) ×2 IMPLANT
DRAPE WARM FLUID 44X44 (DRAPES) IMPLANT
DRSG PAD ABDOMINAL 8X10 ST (GAUZE/BANDAGES/DRESSINGS) IMPLANT
DRSG VAC ATS MED SENSATRAC (GAUZE/BANDAGES/DRESSINGS) ×1 IMPLANT
DRSG VAC ATS SM SENSATRAC (GAUZE/BANDAGES/DRESSINGS) ×1 IMPLANT
DRSG VERSA FOAM LRG 10X15 (GAUZE/BANDAGES/DRESSINGS) ×1 IMPLANT
ELECT REM PT RETURN 9FT ADLT (ELECTROSURGICAL) ×2
ELECTRODE REM PT RTRN 9FT ADLT (ELECTROSURGICAL) ×1 IMPLANT
GAUZE SPONGE 4X4 12PLY STRL (GAUZE/BANDAGES/DRESSINGS) ×2 IMPLANT
GLOVE SURG PROTEXIS BL SZ6.5 (GLOVE) IMPLANT
GLOVE SURG SYN 6.5 PF PI BL (GLOVE) IMPLANT
GLOVE SURG SYN 7.5  E (GLOVE) ×1
GLOVE SURG SYN 7.5 E (GLOVE) ×1 IMPLANT
GLOVE SURG SYN 7.5 PF PI (GLOVE) IMPLANT
GOWN STRL REUS W/ TWL LRG LVL3 (GOWN DISPOSABLE) ×2 IMPLANT
GOWN STRL REUS W/ TWL XL LVL3 (GOWN DISPOSABLE) ×1 IMPLANT
GOWN STRL REUS W/TWL LRG LVL3 (GOWN DISPOSABLE) ×2
GOWN STRL REUS W/TWL XL LVL3 (GOWN DISPOSABLE) ×1
HANDPIECE INTERPULSE COAX TIP (DISPOSABLE)
HEMOSTAT POWDER SURGIFOAM 1G (HEMOSTASIS) IMPLANT
KIT BASIN OR (CUSTOM PROCEDURE TRAY) ×2 IMPLANT
KIT TURNOVER KIT B (KITS) ×2 IMPLANT
MARKER PEN SURG W/LABELS BLK (STERILIZATION PRODUCTS) ×1 IMPLANT
NS IRRIG 1000ML POUR BTL (IV SOLUTION) ×4 IMPLANT
PACK GENERAL/GYN (CUSTOM PROCEDURE TRAY) ×1 IMPLANT
PAD ARMBOARD 7.5X6 YLW CONV (MISCELLANEOUS) ×4 IMPLANT
PENCIL BUTTON HOLSTER BLD 10FT (ELECTRODE) ×1 IMPLANT
SET HNDPC FAN SPRY TIP SCT (DISPOSABLE) IMPLANT
SPONGE T-LAP 18X18 ~~LOC~~+RFID (SPONGE) ×3 IMPLANT
SUT STEEL 6MS V (SUTURE) IMPLANT
SUT STEEL STERNAL CCS#1 18IN (SUTURE) IMPLANT
SUT STEEL SZ 6 DBL 3X14 BALL (SUTURE) IMPLANT
SUT VIC AB 1 CTX 36 (SUTURE)
SUT VIC AB 1 CTX36XBRD ANBCTR (SUTURE) ×2 IMPLANT
SUT VIC AB 2-0 CTX 27 (SUTURE) ×2 IMPLANT
SUT VIC AB 3-0 X1 27 (SUTURE) ×2 IMPLANT
SWAB COLLECTION DEVICE MRSA (MISCELLANEOUS) IMPLANT
SWAB CULTURE ESWAB REG 1ML (MISCELLANEOUS) IMPLANT
SYR 5ML LL (SYRINGE) IMPLANT
TOWEL GREEN STERILE (TOWEL DISPOSABLE) ×2 IMPLANT
TOWEL GREEN STERILE FF (TOWEL DISPOSABLE) ×2 IMPLANT
TRAP SPECIMEN MUCUS 40CC (MISCELLANEOUS) ×1 IMPLANT
TRAY FOLEY MTR SLVR 14FR STAT (SET/KITS/TRAYS/PACK) IMPLANT
WATER STERILE IRR 1000ML POUR (IV SOLUTION) ×2 IMPLANT

## 2021-04-20 NOTE — Anesthesia Postprocedure Evaluation (Signed)
Anesthesia Post Note  Patient: [name]  Procedure(s) Performed: STERNAL WOUND IRRIGATION & DEBRIDEMENT APPLICATION OF WOUND VAC     Patient location during evaluation: PACU Anesthesia Type: General Level of consciousness: awake and alert Pain management: pain level controlled Vital Signs Assessment: post-procedure vital signs reviewed and stable Respiratory status: spontaneous breathing, nonlabored ventilation, respiratory function stable and patient connected to nasal cannula oxygen Cardiovascular status: blood pressure returned to baseline and stable Postop Assessment: no apparent nausea or vomiting Anesthetic complications: no   No notable events documented.  Last Vitals:  Vitals:   05/02/2021 1005 04/17/2021 1032  BP: (!) 143/84 (!) 144/85  Pulse: 99 98  Resp: (!) 24 20  Temp: 36.4 C 37.1 C  SpO2: 97% 96%    Last Pain:  Vitals:   05/10/2021 1032  TempSrc: Axillary  PainSc:                  March Rummage Wakeelah Solan

## 2021-04-20 NOTE — Anesthesia Procedure Notes (Signed)
Procedure Name: Intubation Date/Time: 04/19/2021 7:45 AM Performed by: Gaylene Brooks, CRNA Pre-anesthesia Checklist: Patient identified, Emergency Drugs available, Suction available and Patient being monitored Patient Re-evaluated:Patient Re-evaluated prior to induction Oxygen Delivery Method: Circle System Utilized Preoxygenation: Pre-oxygenation with 100% oxygen Induction Type: IV induction Ventilation: Mask ventilation without difficulty Laryngoscope Size: Miller and 2 Grade View: Grade I Tube type: Oral Number of attempts: 1 Airway Equipment and Method: Stylet and Oral airway Placement Confirmation: ETT inserted through vocal cords under direct vision, positive ETCO2 and breath sounds checked- equal and bilateral Secured at: 22 cm Tube secured with: Tape Dental Injury: Teeth and Oropharynx as per pre-operative assessment

## 2021-04-20 NOTE — Brief Op Note (Addendum)
04/21/2021  8:48 AM  PATIENT:  [name]  85 y.o. male  PRE-OPERATIVE DIAGNOSIS:  Infected sternal fracture with chest wall and anterior mediastinal abscess  POST-OPERATIVE DIAGNOSIS:   Infected sternal fracture with chest wall and anterior mediastinal abscess  PROCEDURE:  STERNAL WOUND INCISION & DRAINAGE,  EXCISIONAL DEBRIDEMENT  APPLICATION OF WOUND VAC   SURGEON: Melrose Nakayama, MD - Primary  PHYSICIAN ASSISTANT: Roddenberry   ANESTHESIA:   general  EBL:  29ml   BLOOD ADMINISTERED:none  DRAINS:  Wound Vac to open sternal wound.     LOCAL MEDICATIONS USED:  NONE  SPECIMEN:  Devitalized bone, purulent fluid for culture  DISPOSITION OF SPECIMEN:   Microbiology  COUNTS:  YES  DICTATION: .Dragon Dictation  PLAN OF CARE: Admit to inpatient   PATIENT DISPOSITION:  PACU - hemodynamically stable.   Delay start of Pharmacological VTE agent (>24hrs) due to surgical blood loss or risk of bleeding: yes

## 2021-04-20 NOTE — Transfer of Care (Signed)
Immediate Anesthesia Transfer of Care Note  Patient: [name]  Procedure(s) Performed: STERNAL WOUND IRRIGATION & DEBRIDEMENT APPLICATION OF WOUND VAC  Patient Location: PACU  Anesthesia Type:General  Level of Consciousness: patient cooperative and confused  Airway & Oxygen Therapy: Patient Spontanous Breathing and Patient connected to nasal cannula oxygen  Post-op Assessment: Report given to RN, Post -op Vital signs reviewed and stable and Patient moving all extremities X 4  Post vital signs: Reviewed and stable  Last Vitals:  Vitals Value Taken Time  BP 143/80 05/11/2021 0907  Temp    Pulse 90 04/15/2021 0909  Resp 21 04/28/2021 0909  SpO2 98 % 05/02/2021 0909  Vitals shown include unvalidated device data.  Last Pain:  Vitals:   05/08/2021 0349  TempSrc: Oral  PainSc: Asleep         Complications: No notable events documented.

## 2021-04-20 NOTE — Progress Notes (Signed)
PROGRESS NOTE    [name]   YJE:[id number]  DOB: [birth year]  PCP: [name], DO    DOA: 04/15/2021 LOS: 2    Brief Narrative / Hospital Course to Date:    85 y.o. male with medical history significant of HTN; HLD; remote prostate CA; CAD s/p remote CABG; and abdominal wall melanoma presenting to [facility] on 04/17/21 with sepsis, presenting with fever, encephalopathy, respiratory distress and A-fib RVR.  Transferred to [facility] on 04/21/2021 for CT surgery coverage due to a mediastinal fluid collection associated with transverse sternal fracture.  Patient found to have Staph bacteremia with findings of pulmonary septic emboli on CT scan.  Infectious disease, CT surgery, neurosurgery, palliative care consulted.  Assessment & Plan   Principal Problem:   Sepsis due to methicillin resistant Staphylococcus aureus (MRSA) ([place]) Active Problems:   History of prostate cancer   Essential hypertension   CAD (coronary artery disease)   Hyperlipidemia, mixed   Lumbar compression fracture (HCC)   Atrial fibrillation with RVR (HCC)   Sternal wound infection   SDH (subdural hematoma)   Pneumonia involving right lung   Delirium   DNR (do not resuscitate)   Goals of care, counseling/discussion   Osteomyelitis of thoracic region ([place])   Severe sepsis due to MRSA bacteremia -Sepsis POA with leukocytosis, tachycardia, tachypnea, hypoxia.  Evidence of acute organ failure with elevated lactate >2 that is not easily explained by another condition. Blood cultures are positive for MRSA. Septic emboli appreciated on imaging. Suspected source is unknown - PNA, spinal osteo, recent skin surgery are considerations (see below) --Follow up blood and urine cx's --Continue IV Vancomycin --ID following --Monitor hemodynamics closely --Follow up pending TTE --Palliative care consulted given severity of illness and overall poor prognosis with high risk of morbidity and mortality    Anterior mediastinal abscess/phlegmon - Mediastinal fluid collection with gas/fluid with transverse sternal fracture.  11/8: Sternal swelling noted to have increased --CT surgery consulted --Patient taken for I&D this morning and wound VAC placed --On Vancomycin with ID following   Subdural Hematoma (SDH) - pt has recent SDH x 2, acute on chronic, seen at [facility].  Due to multiple falls.  -Also with compression fractures, ?osteomyelitis -Will ask neurosurgery to review images to ensure that no intervention is needed --Anticoagulation and ASA are contraindicated at this time   R midlung PNA vs Septic Emboli - patient appears not to have respiratory symptoms but has O2 requirement with on admission.   Appears to have septic emboli on imaging. Treated with Vanc/Zosyn on admission. Zosyn d/c'd per ID Continue Vancomycin  Hypokalemia - K 2.8 again this morning, being replaced with K riders IV.  Follow Mg level. Replace K and Mg, target K>4, Mg>2 given A-fib RVR. --Repeat potassium level this afternoon   New onset A-fib - POA, likely triggered by infection.  Started on Cardizem drip on admission, continued. Anticoagulation contraindicated in setting of SDH.  Thoracolumbar compression fractures with ? Osteomyelitis - no surgical intervention planned.  Seen by neurosurgery.  On antibiotics per ID.   Abdominal wall melanoma - pt has steri-strips in place, no obvious infection.  Path reports from excision reflect negative margins. Monitor.   L AC superficial thrombophlebitis -  --Heat packs PRN --No DVT on imaging --Not a candidate for [facility] or ASA due to SDH   HTN - Norvasc and Cozaar on hold due to soft BPs   HLD - Hold pravastatin for now with AMS, resume when safe to  take PO   Remote prostate CA - with bladder wall thickening noted on imaging.  Monitor for now.  Further evaluation once above acute issues are improved.   CAD - s/p remote CABG   Delirium - most likely due to infection,  unclear if recent traumatic SDH contributing.   --Hold sedating meds: Primidone, benadryl (consider d/c), Neurontin   DN R - previously DNR/DNI during the admission at [facility], continued at the time of admission. Very guarded prognosis. --Palliative care consulted  Patient BMI: Body mass index is 26.73 kg/m.   DVT prophylaxis: SCDs Start: 05/10/2021 0913 SCDs Start: 05/10/2021 0913   Diet:       Code Status: DNR   Subjective 04/15/2021    Pt somnolent and appears comfortable when seen today postoperatively.  He was taken for I&D of his sternal wound and placement of wound VAC with cardiothoracic surgery this morning.  No acute events reported.  Patient currently is minimally arousable but appears in no distress.   Disposition Plan & Communication   Status is: Inpatient  Remains inpatient appropriate because: severity of illness with ongoing evaluation, on IV antibiotics.   Family Communication: Attempted to reach wife by phone this afternoon, unsuccessful.  We will try again later as time allows.   Consults, Procedures, Significant Events   Consultants:  Infectious disease Palliative care Cardiothoracic surgery Neurosurgery  Procedures:    Antimicrobials:  Anti-infectives (From admission, onward)    Start     Dose/Rate Route Frequency Ordered Stop   04/19/21 0830  vancomycin (VANCOREADY) IVPB 1750 mg/350 mL        1,750 mg 175 mL/hr over 120 Minutes Intravenous Every 24 hours 04/19/21 0742     04/19/2021 2000  vancomycin (VANCOREADY) IVPB 1750 mg/350 mL  Status:  Discontinued        1,750 mg 175 mL/hr over 120 Minutes Intravenous Every 24 hours 04/21/2021 1007 04/19/2021 1706   04/19/2021 1400  piperacillin-tazobactam (ZOSYN) IVPB 3.375 g  Status:  Discontinued        3.375 g 12.5 mL/hr over 240 Minutes Intravenous Every 8 hours 04/17/2021 1007 04/19/21 0742         Micro    Objective   Vitals:   05/04/2021 0950 05/02/2021 1005 04/25/2021 1032 04/19/2021 1513  BP: (!) 145/81  (!) 143/84 (!) 144/85 138/80  Pulse: 95 99 98 100  Resp: (!) 23 (!) 24 20 19   Temp:  97.6 F (36.4 C) 98.8 F (37.1 C)   TempSrc:   Axillary   SpO2: 97% 97% 96% 96%  Weight:        Intake/Output Summary (Last 24 hours) at 04/13/2021 1653 Last data filed at 05/07/2021 1019 Gross per 24 hour  Intake 931.92 ml  Output 3075 ml  Net -2143.08 ml   Filed Weights   04/29/2021 0349  Weight: 89.4 kg    Physical Exam:  General exam: sleeping comfortably, no acute distress Respiratory system: decreased breath sounds but clear bilaterally, normal respiratory effort, on room air. Cardiovascular system: normal S1/S2, irregularly irregular Gastrointestinal system: soft, nondistended, bowel sounds present Central nervous system: Unable to evaluate as patient is somnolent Skin: Wound VAC over upper central chest appears well sealed with serosanguineous fluid in the tubing Psychiatry: Unable to evaluate as patient is somnolent  Labs   Data Reviewed: I have personally reviewed following labs and imaging studies  CBC: Recent Labs  Lab 04/17/21 0856 05/11/2021 0539 04/19/21 0256 04/19/21 1901 04/27/2021 0249  WBC 10.8* 11.6* 10.8* 14.4*  13.0*  NEUTROABS 8.9* 10.2*  --   --   --   HGB 9.9* 8.9* 8.0* 8.2* 8.0*  HCT 29.3* 27.3* 23.0* 24.0* 22.8*  MCV 101.4* 106.6* 97.9 99.2 98.7  PLT 303 296 352 447* 332*   Basic Metabolic Panel: Recent Labs  Lab 04/17/21 0856 04/21/2021 0539 04/19/21 0256 04/19/21 1901 04/17/2021 0603 04/23/2021 1439  NA 127* 132* 135 138 139  --   K 4.9 3.8 2.6* 2.7* 2.8* 3.8  CL 92* 100 104 106 109  --   CO2 22 19* 21* 20* 20*  --   GLUCOSE 126* 131* 129* 141* 128*  --   BUN 30* 23 20 19 19   --   CREATININE 1.07 0.92 0.86 0.94 0.88  --   CALCIUM 9.1 8.9 9.0 9.6 9.7  --   MG  --   --  1.8  --  1.8  --    GFR: Estimated Creatinine Clearance: 69.8 mL/min (by C-G formula based on SCr of 0.88 mg/dL). Liver Function Tests: Recent Labs  Lab 04/17/21 0856  05/05/2021 0539 04/19/21 1901  AST 30 43* 182*  ALT 20 24 94*  ALKPHOS 63 54 107  BILITOT 1.1 1.3* 2.0*  PROT 6.8 6.0* 5.9*  ALBUMIN 2.7* 2.0* 1.8*   No results for input(s): LIPASE, AMYLASE in the last 168 hours. No results for input(s): AMMONIA in the last 168 hours. Coagulation Profile: Recent Labs  Lab 04/17/21 0856 04/19/21 1901  INR 1.2 1.9*   Cardiac Enzymes: No results for input(s): CKTOTAL, CKMB, CKMBINDEX, TROPONINI in the last 168 hours. BNP (last 3 results) No results for input(s): PROBNP in the last 8760 hours. HbA1C: No results for input(s): HGBA1C in the last 72 hours. CBG: Recent Labs  Lab 04/17/21 2129  GLUCAP 126*   Lipid Profile: No results for input(s): CHOL, HDL, LDLCALC, TRIG, CHOLHDL, LDLDIRECT in the last 72 hours. Thyroid Function Tests: No results for input(s): TSH, T4TOTAL, FREET4, T3FREE, THYROIDAB in the last 72 hours. Anemia Panel: No results for input(s): VITAMINB12, FOLATE, FERRITIN, TIBC, IRON, RETICCTPCT in the last 72 hours. Sepsis Labs: Recent Labs  Lab 04/17/21 0856 04/17/21 0947 04/17/21 1158 04/28/2021 0539 04/30/2021 0737 05/08/2021 0603  PROCALCITON  --   --   --   --  3.69 1.36  LATICACIDVEN 2.6* 2.9* 1.6 1.6  --   --     Recent Results (from the past 240 hour(s))  Resp Panel by RT-PCR (Flu A&B, Covid) Nasopharyngeal Swab     Status: None   Collection Time: 04/17/21  8:56 AM   Specimen: Nasopharyngeal Swab; Nasopharyngeal(NP) swabs in vial transport medium  Result Value Ref Range Status   SARS Coronavirus 2 by RT PCR NEGATIVE NEGATIVE Final    Comment: (NOTE) SARS-CoV-2 target nucleic acids are NOT DETECTED.  The SARS-CoV-2 RNA is generally detectable in upper respiratory specimens during the acute phase of infection. The lowest concentration of SARS-CoV-2 viral copies this assay can detect is 138 copies/mL. A negative result does not preclude SARS-Cov-2 infection and should not be used as the sole basis for treatment  or other patient management decisions. A negative result may occur with  improper specimen collection/handling, submission of specimen other than nasopharyngeal swab, presence of viral mutation(s) within the areas targeted by this assay, and inadequate number of viral copies(<138 copies/mL). A negative result must be combined with clinical observations, patient history, and epidemiological information. The expected result is Negative.  Fact Sheet for Patients:  EntrepreneurPulse.com.au  Fact Sheet  for Healthcare Providers:  IncredibleEmployment.be  This test is no t yet approved or cleared by the [place] and  has been authorized for detection and/or diagnosis of SARS-CoV-2 by FDA under an Emergency Use Authorization (EUA). This EUA will remain  in effect (meaning this test can be used) for the duration of the COVID-19 declaration under Section 564(b)(1) of the Act, 21 U.S.C.section 360bbb-3(b)(1), unless the authorization is terminated  or revoked sooner.       Influenza A by PCR NEGATIVE NEGATIVE Final   Influenza B by PCR NEGATIVE NEGATIVE Final    Comment: (NOTE) The Xpert Xpress SARS-CoV-2/FLU/RSV plus assay is intended as an aid in the diagnosis of influenza from Nasopharyngeal swab specimens and should not be used as a sole basis for treatment. Nasal washings and aspirates are unacceptable for Xpert Xpress SARS-CoV-2/FLU/RSV testing.  Fact Sheet for Patients: EntrepreneurPulse.com.au  Fact Sheet for Healthcare Providers: IncredibleEmployment.be  This test is not yet approved or cleared by the [place] FDA and has been authorized for detection and/or diagnosis of SARS-CoV-2 by FDA under an Emergency Use Authorization (EUA). This EUA will remain in effect (meaning this test can be used) for the duration of the COVID-19 declaration under Section 564(b)(1) of the Act, 21 U.S.C. section  360bbb-3(b)(1), unless the authorization is terminated or revoked.  Performed at [facility], [address] [place], [place] [address]   Culture, blood (Routine x 2)     Status: Abnormal   Collection Time: 04/17/21  9:20 AM   Specimen: BLOOD  Result Value Ref Range Status   Specimen Description   Final    BLOOD RIGHT FA Performed at [facility], [address], [place], [place] [address]    Special Requests   Final    BOTTLES DRAWN AEROBIC AND ANAEROBIC Blood Culture adequate volume Performed at [facility], [address] [place], [place] [address]    Culture  Setup Time   Final    GRAM POSITIVE COCCI IN BOTH AEROBIC AND ANAEROBIC BOTTLES Organism ID to follow CRITICAL RESULT CALLED TO, READ BACK BY AND VERIFIED WITH: SAM RAUER 2203 04/17/21 LFD Performed at [facility], [address] [place], [place] [address]    Culture (A)  Final    STAPHYLOCOCCUS AUREUS SUSCEPTIBILITIES PERFORMED ON PREVIOUS CULTURE WITHIN THE LAST 5 DAYS. Performed at [facility], [place] [address] [place],  [address]    Report Status 04/27/2021 FINAL  Final  Blood Culture ID Panel (Reflexed)     Status: Abnormal   Collection Time: 04/17/21  9:20 AM  Result Value Ref Range Status   Enterococcus faecalis NOT DETECTED NOT DETECTED Final   Enterococcus Faecium NOT DETECTED NOT DETECTED Final   Listeria monocytogenes NOT DETECTED NOT DETECTED Final   Staphylococcus species DETECTED (A) NOT DETECTED Final    Comment: CRITICAL RESULT CALLED TO, READ BACK BY AND VERIFIED WITH: SAM RAUER 2203 04/17/21 LFD    Staphylococcus aureus (BCID) DETECTED (A) NOT DETECTED Final    Comment: Methicillin (oxacillin)-resistant Staphylococcus aureus (MRSA). MRSA is predictably resistant to beta-lactam antibiotics (except ceftaroline). Preferred therapy is vancomycin unless clinically contraindicated. Patient requires contact precautions if  hospitalized. CRITICAL  RESULT CALLED TO, READ BACK BY AND VERIFIED WITH: SAM RAUER 2203 04/17/21 LFD    Staphylococcus epidermidis NOT DETECTED NOT DETECTED Final   Staphylococcus lugdunensis NOT DETECTED NOT DETECTED Final   Streptococcus species NOT DETECTED NOT DETECTED Final   Streptococcus agalactiae NOT DETECTED  NOT DETECTED Final   Streptococcus pneumoniae NOT DETECTED NOT DETECTED Final   Streptococcus pyogenes NOT DETECTED NOT DETECTED Final   A.calcoaceticus-baumannii NOT DETECTED NOT DETECTED Final   Bacteroides fragilis NOT DETECTED NOT DETECTED Final   Enterobacterales NOT DETECTED NOT DETECTED Final   Enterobacter cloacae complex NOT DETECTED NOT DETECTED Final   Escherichia coli NOT DETECTED NOT DETECTED Final   Klebsiella aerogenes NOT DETECTED NOT DETECTED Final   Klebsiella oxytoca NOT DETECTED NOT DETECTED Final   Klebsiella pneumoniae NOT DETECTED NOT DETECTED Final   Proteus species NOT DETECTED NOT DETECTED Final   Salmonella species NOT DETECTED NOT DETECTED Final   Serratia marcescens NOT DETECTED NOT DETECTED Final   Haemophilus influenzae NOT DETECTED NOT DETECTED Final   Neisseria meningitidis NOT DETECTED NOT DETECTED Final   Pseudomonas aeruginosa NOT DETECTED NOT DETECTED Final   Stenotrophomonas maltophilia NOT DETECTED NOT DETECTED Final   Candida albicans NOT DETECTED NOT DETECTED Final   Candida auris NOT DETECTED NOT DETECTED Final   Candida glabrata NOT DETECTED NOT DETECTED Final   Candida krusei NOT DETECTED NOT DETECTED Final   Candida parapsilosis NOT DETECTED NOT DETECTED Final   Candida tropicalis NOT DETECTED NOT DETECTED Final   Cryptococcus neoformans/gattii NOT DETECTED NOT DETECTED Final   Meth resistant mecA/C and MREJ DETECTED (A) NOT DETECTED Final    Comment: CRITICAL RESULT CALLED TO, READ BACK BY AND VERIFIED WITH: SAM RAUER 2203 04/17/21 LFD Performed at [facility], [address] [place], [place] [address]   Culture, blood (Routine x 2)      Status: Abnormal   Collection Time: 04/17/21  9:47 AM   Specimen: BLOOD  Result Value Ref Range Status   Specimen Description   Final    BLOOD RIGHT ARM Performed at [facility], [address] [place], [place] [address]    Special Requests   Final    BOTTLES DRAWN AEROBIC AND ANAEROBIC Blood Culture results may not be optimal due to an excessive volume of blood received in culture bottles Performed at [facility], [address] [place], [place] [address]    Culture  Setup Time   Final    GRAM POSITIVE COCCI IN BOTH AEROBIC AND ANAEROBIC BOTTLES CRITICAL VALUE NOTED.  VALUE IS CONSISTENT WITH PREVIOUSLY REPORTED AND CALLED VALUE. Performed at [facility], [address] [place], [place] [address]    Culture METHICILLIN RESISTANT STAPHYLOCOCCUS AUREUS (A)  Final   Report Status 04/25/2021 FINAL  Final   Organism ID, Bacteria METHICILLIN RESISTANT STAPHYLOCOCCUS AUREUS  Final      Susceptibility   Methicillin resistant staphylococcus aureus - MIC*    CIPROFLOXACIN >=8 RESISTANT Resistant     ERYTHROMYCIN <=0.25 SENSITIVE Sensitive     GENTAMICIN <=0.5 SENSITIVE Sensitive     OXACILLIN >=4 RESISTANT Resistant     TETRACYCLINE <=1 SENSITIVE Sensitive     VANCOMYCIN <=0.5 SENSITIVE Sensitive     TRIMETH/SULFA <=10 SENSITIVE Sensitive     CLINDAMYCIN <=0.25 SENSITIVE Sensitive     RIFAMPIN <=0.5 SENSITIVE Sensitive     Inducible Clindamycin NEGATIVE Sensitive     * METHICILLIN RESISTANT STAPHYLOCOCCUS AUREUS  Urine Culture     Status: Abnormal   Collection Time: 04/17/21 11:58 AM   Specimen: Urine, Random  Result Value Ref Range Status   Specimen Description   Final    URINE, RANDOM Performed at [facility], [address], [place], [place] [address]    Special Requests  Final    NONE Performed at [facility], [address] [place], [place] [address]    Culture MULTIPLE SPECIES PRESENT, SUGGEST RECOLLECTION (A)   Final   Report Status 04/19/2021 FINAL  Final  Culture, blood (routine x 2)     Status: None (Preliminary result)   Collection Time: 04/19/21  7:55 AM   Specimen: BLOOD LEFT WRIST  Result Value Ref Range Status   Specimen Description BLOOD LEFT WRIST  Final   Special Requests   Final    BOTTLES DRAWN AEROBIC AND ANAEROBIC Blood Culture adequate volume   Culture   Final    NO GROWTH 1 DAY Performed at [facility], [place] [address], [place], [place] [address]    Report Status PENDING  Incomplete  Culture, blood (routine x 2)     Status: None (Preliminary result)   Collection Time: 04/19/21  8:02 AM   Specimen: BLOOD LEFT HAND  Result Value Ref Range Status   Specimen Description BLOOD LEFT HAND  Final   Special Requests   Final    BOTTLES DRAWN AEROBIC AND ANAEROBIC Blood Culture adequate volume   Culture   Final    NO GROWTH 1 DAY Performed at [facility], [place] [address] [place], [place] [address]    Report Status PENDING  Incomplete  MRSA Next Gen by PCR, Nasal     Status: Abnormal   Collection Time: 04/19/21 11:13 AM   Specimen: Nasal Mucosa; Nasal Swab  Result Value Ref Range Status   MRSA by PCR Next Gen DETECTED (A) NOT DETECTED Final    Comment: RESULT CALLED TO, READ BACK BY AND VERIFIED WITH: D WHITE RN 1503 04/19/21 A BROWNING (NOTE) The GeneXpert MRSA Assay (FDA approved for NASAL specimens only), is one component of a comprehensive MRSA colonization surveillance program. It is not intended to diagnose MRSA infection nor to guide or monitor treatment for MRSA infections. Test performance is not FDA approved in patients less than 67 years old. Performed at [facility], [place] [address], [place], [place] [address]   Aerobic/Anaerobic Culture w Gram Stain (surgical/deep wound)     Status: None (Preliminary result)   Collection Time: 04/17/2021  8:23 AM   Specimen: Wound; Body Fluid  Result Value Ref Range Status   Specimen Description WOUND  Final    Special Requests  STERNAL WOUND  Final   Gram Stain   Final    FEW WBC PRESENT,BOTH PMN AND MONONUCLEAR FEW GRAM POSITIVE COCCI IN PAIRS Performed at [facility], [address], [place], [place] [address]    Culture PENDING  Incomplete   Report Status PENDING  Incomplete  Aerobic/Anaerobic Culture w Gram Stain (surgical/deep wound)     Status: None (Preliminary result)   Collection Time: 04/28/2021  8:27 AM   Specimen: PATH Bone biopsy; Tissue  Result Value Ref Range Status   Specimen Description TISSUE  Final   Special Requests  STERNUM  Final   Gram Stain   Final    FEW WBC PRESENT,BOTH PMN AND MONONUCLEAR FEW GRAM POSITIVE COCCI IN PAIRS Performed at [facility], [address], [place], [place] [address]    Culture PENDING  Incomplete   Report Status PENDING  Incomplete      Imaging Studies   DG Chest 2 View  Result Date: 04/19/2021 CLINICAL DATA:  Preop EXAM: CHEST - 2 VIEW COMPARISON:  04/17/2021, 08/17/2020 FINDINGS: Post sternotomy changes. Stable cardiomediastinal silhouette  allowing for patient rotation. Aortic atherosclerosis. Cavitary foci of airspace disease within the upper lobes. Possible small pleural effusions. Sternal deformity/fracture on the CT is better seen on CT. IMPRESSION: 1. Cavitary foci of airspace disease within the upper lungs, corresponding to suspected septic emboli on CT 2. Probable small pleural effusions. Hazy atelectasis or pneumonia at the medial left base. Electronically Signed   By: Donavan Foil M.D.   On: 04/19/2021 21:15   ECHOCARDIOGRAM COMPLETE  Result Date: 04/19/2021    ECHOCARDIOGRAM REPORT   Patient Name:   [name] Date of Exam: 04/19/2021 Medical Rec #:  [id number]        Height:       72.0 in Accession #:    [id number]       Weight:       191.1 lb Date of Birth:  August 18, 1935        BSA:          2.090 m Patient Age:    74 years         BP:           145/72 mmHg Patient Gender: M                HR:           107 bpm.  Exam Location:  Inpatient Procedure: 2D Echo, Cardiac Doppler and Color Doppler Indications:    Dyspnea  History:        Patient has prior history of Echocardiogram examinations, most                 recent 08/13/2020. Previous Myocardial Infarction.  Sonographer:    Merrie Roof RDCS Referring Phys: [place]  1. Left ventricular ejection fraction, by estimation, is 60 to 65%. The left ventricle has normal function. The left ventricle has no regional wall motion abnormalities. Left ventricular diastolic parameters are indeterminate.  2. Right ventricular systolic function is normal. The right ventricular size is normal.  3. Left atrial size was mildly dilated.  4. Right atrial size was mildly dilated.  5. The mitral valve is degenerative. Trivial mitral valve regurgitation. No evidence of mitral stenosis. Moderate mitral annular calcification.  6. The aortic valve is tricuspid. Aortic valve regurgitation is not visualized. Mild to moderate aortic valve sclerosis/calcification is present, without any evidence of aortic stenosis.  7. The inferior vena cava is normal in size with greater than 50% respiratory variability, suggesting right atrial pressure of 3 mmHg. FINDINGS  Left Ventricle: Left ventricular ejection fraction, by estimation, is 60 to 65%. The left ventricle has normal function. The left ventricle has no regional wall motion abnormalities. The left ventricular internal cavity size was normal in size. There is  no left ventricular hypertrophy. Left ventricular diastolic parameters are indeterminate. Right Ventricle: The right ventricular size is normal. No increase in right ventricular wall thickness. Right ventricular systolic function is normal. Left Atrium: Left atrial size was mildly dilated. Right Atrium: Right atrial size was mildly dilated. Pericardium: There is no evidence of pericardial effusion. Mitral Valve: The mitral valve is degenerative in appearance. There is moderate  thickening of the mitral valve leaflet(s). There is moderate calcification of the mitral valve leaflet(s). Moderate mitral annular calcification. Trivial mitral valve regurgitation. No evidence of mitral valve stenosis. Tricuspid Valve: The tricuspid valve is normal in structure. Tricuspid valve regurgitation is not demonstrated. No evidence of tricuspid stenosis. Aortic Valve: The aortic valve is tricuspid. Aortic valve regurgitation is not  visualized. Mild to moderate aortic valve sclerosis/calcification is present, without any evidence of aortic stenosis. Pulmonic Valve: The pulmonic valve was normal in structure. Pulmonic valve regurgitation is not visualized. No evidence of pulmonic stenosis. Aorta: The aortic root is normal in size and structure. Venous: The inferior vena cava is normal in size with greater than 50% respiratory variability, suggesting right atrial pressure of 3 mmHg. IAS/Shunts: The interatrial septum was not well visualized.  LEFT VENTRICLE PLAX 2D LVIDd:         4.59 cm   Diastology LVIDs:         3.07 cm   LV e' medial:  12.20 cm/s LV PW:         0.88 cm   LV e' lateral: 15.90 cm/s LV IVS:        1.02 cm LVOT diam:     2.20 cm LV SV:         82 LV SV Index:   39 LVOT Area:     3.80 cm  LEFT ATRIUM             Index LA diam:        4.00 cm 1.91 cm/m LA Vol (A2C):   91.1 ml 43.59 ml/m LA Vol (A4C):   86.5 ml 41.39 ml/m LA Biplane Vol: 90.0 ml 43.06 ml/m  AORTIC VALVE LVOT Vmax:   137.00 cm/s LVOT Vmean:  103.000 cm/s LVOT VTI:    0.216 m  AORTA Ao Root diam: 3.50 cm  SHUNTS Systemic VTI:  0.22 m Systemic Diam: 2.20 cm Jenkins Rouge MD Electronically signed by Jenkins Rouge MD Signature Date/Time: 04/19/2021/3:54:24 PM    Final      Medications   Scheduled Meds:  acetaminophen  1,000 mg Oral Q6H   azelastine  1 spray Each Nare BID   chlorhexidine  15 mL Mouth Rinse BID   Chlorhexidine Gluconate Cloth  6 each Topical Daily   docusate sodium  100 mg Oral BID   mouth rinse  15 mL  Mouth Rinse q12n4p   sodium chloride flush  3 mL Intravenous Q12H   Continuous Infusions:  diltiazem (CARDIZEM) infusion 15 mg/hr (04/19/2021 0922)   lactated ringers 100 mL/hr at 04/16/2021 1508   vancomycin 0 mg (04/19/21 1150)       LOS: 2 days    Time spent: 30 minutes      Ezekiel Slocumb, DO [facility]  05/02/2021, 4:53 PM      If 7PM-7AM, please contact night-coverage. How to contact the [facility] Attending or Consulting provider [place] or covering provider during after hours [place], for this patient?    Check the care team in [facility] and look for a) attending/consulting TRH provider listed and b) the [facility] team listed Log into [email or web address] and use 's universal password to access. If you do not have the password, please contact the hospital operator. Locate the [facility] provider you are looking for under [facility] and page to a number that you can be directly reached. If you still have difficulty reaching the provider, please page the [facility] (Director on Call) for the Hospitalists listed on amion for assistance.

## 2021-04-20 NOTE — Anesthesia Preprocedure Evaluation (Addendum)
Anesthesia Evaluation  Patient identified by MRN, date of birth, ID band Patient confused    Reviewed: Allergy & Precautions, NPO status , Patient's Chart, lab work & pertinent test results  Airway Mallampati: II  TM Distance: <3 FB Neck ROM: Full    Dental  (+) Poor Dentition   Pulmonary neg pulmonary ROS,    Pulmonary exam normal        Cardiovascular hypertension, Pt. on medications and Pt. on home beta blockers + CAD, + Past MI, + CABG and +CHF   Rhythm:Irregular Rate:Tachycardia     Neuro/Psych Dementia negative psych ROS   GI/Hepatic Neg liver ROS, GERD  Medicated,  Endo/Other  negative endocrine ROS  Renal/GU negative Renal ROS  negative genitourinary   Musculoskeletal  (+) Arthritis , Osteoarthritis,    Abdominal Normal abdominal exam  (+)   Peds  Hematology  (+) anemia ,   Anesthesia Other Findings   Reproductive/Obstetrics                            Anesthesia Physical Anesthesia Plan  ASA: 3  Anesthesia Plan: General   Post-op Pain Management:    Induction: Intravenous  PONV Risk Score and Plan: 2 and Ondansetron, Dexamethasone and Treatment may vary due to age or medical condition  Airway Management Planned: Mask and Oral ETT  Additional Equipment: None  Intra-op Plan:   Post-operative Plan: Possible Post-op intubation/ventilation  Informed Consent: I have reviewed the patients History and Physical, chart, labs and discussed the procedure including the risks, benefits and alternatives for the proposed anesthesia with the patient or authorized representative who has indicated his/her understanding and acceptance.   Patient has DNR.   Dental advisory given  Plan Discussed with: CRNA  Anesthesia Plan Comments: (- S/P fall with sternal wound fx and infection. Baseline cognitive dysfunction poorly responsive to commands beyond name. New onset AF RVR 2/2 sepsis  currently on Cardizem. Received 4 runs of KCL overnight for K 2.7. Repeat this am 2.8. Given worsening hemodynamics and infection, will plan to proceed given urgent need and begin further KCL repletion intraoperatively.   Lab Results      Component                Value               Date                      WBC                      13.0 (H)            04/15/2021                HGB                      8.0 (L)             05/12/2021                HCT                      22.8 (L)            04/19/2021                MCV  98.7                04/21/2021                PLT                      420 (H)             05/09/2021           Lab Results      Component                Value               Date                      NA                       138                 04/19/2021                K                        2.7 (LL)            04/19/2021                CO2                      20 (L)              04/19/2021                GLUCOSE                  141 (H)             04/19/2021                BUN                      19                  04/19/2021                CREATININE               0.94                04/19/2021                CALCIUM                  9.6                 04/19/2021                GFRNONAA                 >60                 04/19/2021           ECHO 11/22: 1. Left ventricular ejection fraction, by estimation, is 60 to 65%. The  left ventricle has normal function. The left ventricle has no regional  wall motion abnormalities. Left ventricular diastolic parameters are  indeterminate.  2. Right ventricular systolic function is normal. The right ventricular  size is normal.  3. Left atrial size was mildly dilated.  4. Right atrial size was mildly dilated.  5. The mitral valve is degenerative. Trivial mitral valve regurgitation.  No evidence of mitral stenosis. Moderate mitral annular calcification.  6. The aortic valve is tricuspid. Aortic  valve regurgitation is not  visualized. Mild to moderate aortic valve sclerosis/calcification is  present, without any evidence of aortic stenosis.  7. The inferior vena cava is normal in size with greater than 50%  respiratory variability, suggesting right atrial pressure of 3 mmHg. )      Anesthesia Quick Evaluation

## 2021-04-20 NOTE — Interval H&P Note (Signed)
History and Physical Interval Note:   04/25/2021 7:21 AM  [name]  has presented today for surgery, with the diagnosis of STERNAL WOUND.  The various methods of treatment have been discussed with the patient and family. After consideration of risks, benefits and other options for treatment, the patient has consented to  Procedure(s): [place] (N/A) APPLICATION OF WOUND VAC (N/A) as a surgical intervention.  The patient's history has been reviewed, patient examined, no change in status, stable for surgery.  I have reviewed the patient's chart and labs.  Questions were answered to the patient's satisfaction.     Melrose Nakayama

## 2021-04-20 NOTE — Progress Notes (Signed)
Daily Progress Note   Patient Name: Macyn Remmert       Date: 04/24/2021 DOB: 08/15/1935  Age: 85 y.o. MRN#: [id number] Attending Physician: Ezekiel Slocumb, DO Primary Care Physician: [name], DO Admit Date: 05/02/2021  Reason for Consultation/Follow-up: Establishing goals of care  Subjective: Medical records reviewed.   I called patient's wife [name] to continue goals of care conversation and provide support. Provided further clarification of palliative care's role on Robby's care team. She was initially planning to meet with me at 11am today but reports sustaining a fall yesterday, resulting in her going to [facility] for ED evaluation. While she is now back home, she will need to rest. She awaits updates on Cailen's surgery and I informed her that he is in PACU per chart review. Also provided contact information for nursing unit.   She requests assistance with cancellation of an outpatient appointment and I explained that inpatient PMT coordinates with other inpatient services to provide support, ensuring her understanding of Calhoun's condition and treatment options. Encouraged her to call the office and provide an update on his current hospitalization. She is agreeable to continuing our conversation tomorrow based on how Levern is doing.  Questions and concerns addressed. PMT will continue to support holistically.  Length of Stay: 2  Current Medications: Scheduled Meds:   [MAR Hold] azelastine  1 spray Each Nare BID   [MAR Hold] chlorhexidine  15 mL Mouth Rinse BID   [MAR Hold] Chlorhexidine Gluconate Cloth  6 each Topical Daily   [MAR Hold] docusate sodium  100 mg Oral BID   [MAR Hold] mouth rinse  15 mL Mouth Rinse q12n4p   [MAR Hold] sodium chloride flush  3 mL  Intravenous Q12H    Continuous Infusions:  acetaminophen     [MAR Hold] diltiazem (CARDIZEM) infusion 15 mg/hr (04/29/2021 0731)   lactated ringers 100 mL/hr at 04/17/2021 0227   potassium chloride     [MAR Hold] vancomycin 0 mg (04/19/21 1150)    PRN Meds: acetaminophen, [MAR Hold] acetaminophen **OR** [MAR Hold] acetaminophen, [MAR Hold] bisacodyl, fentaNYL (SUBLIMAZE) injection, [MAR Hold] haloperidol lactate, [MAR Hold] ondansetron **OR** [MAR Hold] ondansetron (ZOFRAN) IV, [MAR Hold] polyethylene glycol  Physical Exam Vitals and nursing note reviewed.  Constitutional:      General: He is not in acute distress.  Appearance: He is ill-appearing.  Cardiovascular:     Rate and Rhythm: Tachycardia present.  Pulmonary:     Effort: Pulmonary effort is normal.  Neurological:     Mental Status: He is disoriented.            Vital Signs: BP (!) 147/75 (BP Location: Left Arm)   Pulse 87   Temp 98.7 F (37.1 C) (Oral)   Resp 20   Wt 89.4 kg   SpO2 100%   BMI 26.73 kg/m  SpO2: SpO2: 100 % O2 Device: O2 Device: Room Air O2 Flow Rate: O2 Flow Rate (L/min): 0 L/min  Intake/output summary:  Intake/Output Summary (Last 24 hours) at 05/10/2021 0913 Last data filed at 05/03/2021 0850 Gross per 24 hour  Intake 2541.82 ml  Output 3325 ml  Net -783.18 ml    LBM:   Baseline Weight: Weight: 89.4 kg Most recent weight: Weight: 89.4 kg       Palliative Assessment/Data: 30%      Patient Active Problem List   Diagnosis Date Noted   Osteomyelitis of thoracic region [facility])    Sternal wound infection 04/17/2021   SDH (subdural hematoma) 04/27/2021   Pneumonia involving right lung 05/10/2021   Delirium 04/19/2021   DNR (do not resuscitate) 05/03/2021   Goals of care, counseling/discussion    Atrial fibrillation with RVR ([place]) 04/17/2021   DDD (degenerative disc disease), lumbar 03/03/2021   Cellulitis 02/18/2021   Lumbar compression fracture ([place]) 02/18/2021   Compression  fracture of first lumbar vertebra ([place]) 02/17/2021   Cellulitis and abscess of foot    MRSA bacteremia 08/10/2020   Sepsis due to methicillin resistant Staphylococcus aureus (MRSA) ([place]) 04/24/2020   Cellulitis of knee, left 04/22/2020   Hypokalemia 04/22/2020   Osteoarthritis of multiple joints 12/15/2017   Hyponatremia 03/28/2017   Abnormality of gait and mobility 01/11/2017   Cloudy vision 01/11/2017   Foot drop, bilateral 01/11/2017   Amputee, great toe, left (HCC) 01/11/2017   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   Macrocytic anemia 09/22/2015   Moderate tricuspid insufficiency 04/21/2015   History of prostate cancer 04/16/2015   Idiopathic peripheral neuropathy 04/16/2015   Essential hypertension 04/16/2015   GERD (gastroesophageal reflux disease) 04/16/2015   Arthritis 04/16/2015   Bilateral lower extremity edema 04/16/2015   Benign essential tremor 10/30/2014   Hyperlipidemia, mixed 42/35/3614   Chronic systolic CHF (congestive heart failure), NYHA class 2 ([place]) 04/23/2014   Mitral insufficiency 11/05/2013   CAD (coronary artery disease) 11/01/2013   Basal cell carcinoma of skin of other parts of face 03/14/2013   Ankle sprain 09/09/2011   History of fibula fracture 09/09/2011    Palliative Care Assessment & Plan   Patient Profile: 85 y.o. male with past medical history of HTN; HLD; remote prostate CA; CAD s/p remote CABG; dementia, recurrent falls, and abdominal wall melanoma admitted on 05/12/2021 with sepsis from [facility] - new onset fever, respiratory distress after discharging from from [facility] yesterday.   Patient admitted to [facility] from 10/29-11/4 after presenting with a fall with LOC.  He was found to have an acute SDH. At [facility] ED, ED scan showed mediastinum fluid collection with gas, with a transverse fracture of sternum. PMT has been consulted to assist with goals of care conversation.   Assessment: Sepsis MRSA bacteremia Anterior mediastinal  abscess SDH R lung PNA Afib Dementia/delirium Goals of care conversation  Recommendations/Plan: DNR Full scope otherwise, patient's wife is hopeful at this time and understands she may  be overly optimistic  Ongoing goc discussions as clinical picture evolves PMT will continue to follow  Goals of Care and Additional Recommendations: Limitations on Scope of Treatment: Full Scope Treatment  Prognosis:  Guarded  Discharge Planning: To Be Determined   Total time: 15 minutes Greater than 50% of this time was spent in counseling and coordinating care related to the above assessment and plan.  Dorthy Cooler, PA-C Palliative Medicine Team Team phone # [phone]  Thank you for allowing the Palliative Medicine Team to assist in the care of this patient. Please utilize secure chat with additional questions, if there is no response within 30 minutes please call the above phone number.  Palliative Medicine Team providers are available by phone from 7am to 7pm daily and can be reached through the team cell phone.  Should this patient require assistance outside of these hours, please call the patient's attending physician.

## 2021-04-20 NOTE — Progress Notes (Signed)
[facility] Infectious Diseases Follow Up Note  Patient Identification: Patient Name: [name] MRN: [id number] [place] Date: 05/08/2021  4:57 AM Age: 85 y.o.Today's Date: 04/28/2021   Reason for Visit: MRSA bacteremia  Principal Problem:   Sepsis due to methicillin resistant Staphylococcus aureus (MRSA) ([place]) Active Problems:   History of prostate cancer   Essential hypertension   CAD (coronary artery disease)   Hyperlipidemia, mixed   Lumbar compression fracture (HCC)   Atrial fibrillation with RVR (HCC)   Sternal wound infection   SDH (subdural hematoma)   Pneumonia involving right lung   Delirium   DNR (do not resuscitate)   Goals of care, counseling/discussion   Osteomyelitis of thoracic region [facility])   Antibiotics: Vancomycin 11/5-current                     Zosyn 11/5- 11/6  Lines/Hardwares:   Interval Events: afebrile, K 2.6, WBC stable at 10.8  Assessment MRSA bacteremia: TTE with no vegetations. Repeat blood cx 11/7 no growth in 1 day   Recent Falls with SDH (Left convexity subacute and Rt sided chronic) with concerns of acute infarct and hydrocephalus per NeuroSx- no surgical intervention per Neuro SX  Sternal fracture with phlegmon     - s/p sternal I and D on 11/8. OR cultures sent   Encephalopathy  Possible Pulmonary septic emboli  Thoraco-Lumbar compression fractures vs Osteomyelitis - No surgical intervention per NeuroSX Left hip bruise/hematoma    Recommendations Continue Vancomycin, pharmacy to dose. Defer need of TEE to CT SX as he will need prolonged abtx ( at least 6 weeks)anyway Fu repeat blood cx 11/7 for clearance ( no growth in day 1) Fu OR cultures from 11/8 Fu Full OR note Continue GOC discussion with family  Monitor CBC, BMP and Vancomycin trough   Following   Rest of the management as per the primary team. Thank you for the consult. Please page with pertinent questions or  concerns.  ______________________________________________________________________ Subjective patient seen and examined at the bedside post OP. \ Vitals BP (!) 144/85 (BP Location: Left Arm)   Pulse 98   Temp 98.8 F (37.1 C) (Axillary)   Resp 20   Wt 89.4 kg   SpO2 96%   BMI 26.73 kg/m     Physical Exam Constitutional:  Post op and lying in bed     Comments:   Cardiovascular:     Rate and Rhythm: Normal rate and regular rhythm.     Heart sounds:  Pulmonary:     Effort: Pulmonary effort is normal.     Comments:   Abdominal:     Palpations: Abdomen is soft.     Tenderness: Non tender and non distended   Musculoskeletal:        General: Left hip bruises  Skin:    Comments:   Neurological:     General: unable to assess  Psychiatric:        Mood and Affect:unable to assess  Pertinent Microbiology Results for orders placed or performed during the hospital encounter of 05/08/2021  Culture, blood (routine x 2)     Status: None (Preliminary result)   Collection Time: 04/19/21  7:55 AM   Specimen: BLOOD LEFT WRIST  Result Value Ref Range Status   Specimen Description BLOOD LEFT WRIST  Final   Special Requests   Final    BOTTLES DRAWN AEROBIC AND ANAEROBIC Blood Culture adequate volume   Culture   Final    NO GROWTH  1 DAY Performed at [facility], [place] [address], [place], [place] [address]    Report Status PENDING  Incomplete  Culture, blood (routine x 2)     Status: None (Preliminary result)   Collection Time: 04/19/21  8:02 AM   Specimen: BLOOD LEFT HAND  Result Value Ref Range Status   Specimen Description BLOOD LEFT HAND  Final   Special Requests   Final    BOTTLES DRAWN AEROBIC AND ANAEROBIC Blood Culture adequate volume   Culture   Final    NO GROWTH 1 DAY Performed at [facility], [place] [address], [place], [place] [address]    Report Status PENDING  Incomplete  MRSA Next Gen by PCR, Nasal     Status: Abnormal   Collection Time: 04/19/21  11:13 AM   Specimen: Nasal Mucosa; Nasal Swab  Result Value Ref Range Status   MRSA by PCR Next Gen DETECTED (A) NOT DETECTED Final    Comment: RESULT CALLED TO, READ BACK BY AND VERIFIED WITH: D WHITE RN 1503 04/19/21 A BROWNING (NOTE) The GeneXpert MRSA Assay (FDA approved for NASAL specimens only), is one component of a comprehensive MRSA colonization surveillance program. It is not intended to diagnose MRSA infection nor to guide or monitor treatment for MRSA infections. Test performance is not FDA approved in patients less than 52 years old. Performed at [facility], [place] [address], [place], [place] [address]   Aerobic/Anaerobic Culture w Gram Stain (surgical/deep wound)     Status: None (Preliminary result)   Collection Time: 05/09/2021  8:23 AM   Specimen: Wound; Body Fluid  Result Value Ref Range Status   Specimen Description WOUND  Final   Special Requests   Final     STERNAL WOUND Performed at [facility], [place] [address] [place], [place] [address]    Gram Stain PENDING  Incomplete   Culture PENDING  Incomplete   Report Status PENDING  Incomplete  Aerobic/Anaerobic Culture w Gram Stain (surgical/deep wound)     Status: None (Preliminary result)   Collection Time: 04/14/2021  8:27 AM   Specimen: PATH Bone biopsy; Tissue  Result Value Ref Range Status   Specimen Description TISSUE  Final   Special Requests   Final     STERNUM Performed at [place] [address] [place], [place] [address]    Gram Stain PENDING  Incomplete   Culture PENDING  Incomplete   Report Status PENDING  Incomplete    Pertinent Lab. CBC Latest Ref Rng & Units 04/14/2021 04/19/2021 04/19/2021  WBC 4.0 - 10.5 K/uL 13.0(H) 14.4(H) 10.8(H)  Hemoglobin 13.0 - 17.0 g/dL 8.0(L) 8.2(L) 8.0(L)  Hematocrit 39.0 - 52.0 % 22.8(L) 24.0(L) 23.0(L)  Platelets 150 - 400 K/uL 420(H) 447(H) 352   CMP Latest Ref Rng & Units 04/16/2021 04/19/2021 04/19/2021  Glucose 70 - 99 mg/dL 128(H) 141(H)  129(H)  BUN 8 - 23 mg/dL 19 19 20   Creatinine 0.61 - 1.24 mg/dL 0.88 0.94 0.86  Sodium 135 - 145 mmol/L 139 138 135  Potassium 3.5 - 5.1 mmol/L 2.8(L) 2.7(LL) 2.6(LL)  Chloride 98 - 111 mmol/L 109 106 104  CO2 22 - 32 mmol/L 20(L) 20(L) 21(L)  Calcium 8.9 - 10.3 mg/dL 9.7 9.6 9.0  Total Protein 6.5 - 8.1 g/dL - 5.9(L) -  Total Bilirubin 0.3 - 1.2 mg/dL - 2.0(H) -  Alkaline Phos 38 - 126 U/L - 107 -  AST 15 - 41 U/L -  182(H) -  ALT 0 - 44 U/L - 94(H) -     Pertinent Imaging today Plain films and CT images have been personally visualized and interpreted; radiology reports have been reviewed. Decision making incorporated into the Impression / Recommendations.   I spent more than 40 minutes for this patient encounter including review of prior medical records, coordination of care  with greater than 50% of time being face to face/counseling and discussing diagnostics/treatment plan with the patient/family.  Electronically signed by:   Rosiland Oz, MD Infectious Disease Physician [facility] for Infectious Disease Pager: [phone]

## 2021-04-20 NOTE — Progress Notes (Addendum)
RN given report to CRNA, Joie Bimler.  4114 Pt transferred to OR with RN. Pt stable. Pt observed to be comfortable.

## 2021-04-21 ENCOUNTER — Inpatient Hospital Stay: Payer: Medicare Other | Admitting: Family Medicine

## 2021-04-21 ENCOUNTER — Encounter (HOSPITAL_COMMUNITY): Payer: Self-pay | Admitting: Internal Medicine

## 2021-04-21 ENCOUNTER — Other Ambulatory Visit: Payer: Self-pay

## 2021-04-21 ENCOUNTER — Inpatient Hospital Stay (HOSPITAL_COMMUNITY): Payer: Medicare Other

## 2021-04-21 DIAGNOSIS — I10 Essential (primary) hypertension: Secondary | ICD-10-CM | POA: Diagnosis not present

## 2021-04-21 DIAGNOSIS — S065XAA Traumatic subdural hemorrhage with loss of consciousness status unknown, initial encounter: Secondary | ICD-10-CM | POA: Diagnosis not present

## 2021-04-21 DIAGNOSIS — I4891 Unspecified atrial fibrillation: Secondary | ICD-10-CM | POA: Diagnosis not present

## 2021-04-21 DIAGNOSIS — A4102 Sepsis due to Methicillin resistant Staphylococcus aureus: Secondary | ICD-10-CM | POA: Diagnosis not present

## 2021-04-21 DIAGNOSIS — E43 Unspecified severe protein-calorie malnutrition: Secondary | ICD-10-CM | POA: Insufficient documentation

## 2021-04-21 LAB — BASIC METABOLIC PANEL
Anion gap: 11 (ref 5–15)
BUN: 22 mg/dL (ref 8–23)
CO2: 18 mmol/L — ABNORMAL LOW (ref 22–32)
Calcium: 9.3 mg/dL (ref 8.9–10.3)
Chloride: 111 mmol/L (ref 98–111)
Creatinine, Ser: 0.9 mg/dL (ref 0.61–1.24)
GFR, Estimated: >60 mL/min (ref >60)
Glucose, Bld: 141 mg/dL — ABNORMAL HIGH (ref 70–99)
Potassium: 3.8 mmol/L (ref 3.5–5.1)
Sodium: 140 mmol/L (ref 135–145)

## 2021-04-21 LAB — CBC
HCT: 22.7 % — ABNORMAL LOW (ref 39.0–52.0)
Hemoglobin: 7.5 g/dL — ABNORMAL LOW (ref 13.0–17.0)
MCH: 33.6 pg (ref 26.0–34.0)
MCHC: 33 g/dL (ref 30.0–36.0)
MCV: 101.8 fL — ABNORMAL HIGH (ref 80.0–100.0)
Platelets: 495 10*3/uL — ABNORMAL HIGH (ref 150–400)
RBC: 2.23 MIL/uL — ABNORMAL LOW (ref 4.22–5.81)
RDW: 14.6 % (ref 11.5–15.5)
WBC: 9.9 10*3/uL (ref 4.0–10.5)
nRBC: 0.4 % — ABNORMAL HIGH (ref 0.0–0.2)

## 2021-04-21 LAB — PROCALCITONIN: Procalcitonin: 0.77 ng/mL

## 2021-04-21 LAB — PHOSPHORUS: Phosphorus: 3.2 mg/dL (ref 2.5–4.6)

## 2021-04-21 LAB — MAGNESIUM
Magnesium: 1.8 mg/dL (ref 1.7–2.4)
Magnesium: 1.8 mg/dL (ref 1.7–2.4)

## 2021-04-21 LAB — GLUCOSE, CAPILLARY
Glucose-Capillary: 129 mg/dL — ABNORMAL HIGH (ref 70–99)
Glucose-Capillary: 150 mg/dL — ABNORMAL HIGH (ref 70–99)

## 2021-04-21 MED ORDER — DOCUSATE SODIUM 50 MG/5ML PO LIQD
100.0000 mg | Freq: Two times a day (BID) | ORAL | Status: DC
  Administered 2021-04-21 – 2021-05-01 (×17): 100 mg
  Filled 2021-04-21 (×18): qty 10

## 2021-04-21 MED ORDER — POLYETHYLENE GLYCOL 3350 17 G PO PACK: 17.0000 g | PACK | Freq: Every day | ORAL | Status: DC | PRN

## 2021-04-21 MED ORDER — PROSOURCE TF PO LIQD
45.0000 mL | Freq: Two times a day (BID) | ORAL | Status: DC
  Administered 2021-04-21 – 2021-05-03 (×24): 45 mL
  Filled 2021-04-21 (×23): qty 45

## 2021-04-21 MED ORDER — JEVITY 1.5 CAL/FIBER PO LIQD
1000.0000 mL | ORAL | Status: DC
  Administered 2021-04-21 – 2021-04-28 (×6): 1000 mL
  Filled 2021-04-21 (×14): qty 1000

## 2021-04-21 MED ORDER — ONDANSETRON HCL 4 MG PO TABS: 4.0000 mg | ORAL_TABLET | Freq: Four times a day (QID) | ORAL | Status: DC | PRN

## 2021-04-21 MED ORDER — ACETAMINOPHEN 650 MG RE SUPP: 650.0000 mg | Freq: Four times a day (QID) | RECTAL | Status: DC | PRN

## 2021-04-21 MED ORDER — ACETAMINOPHEN 500 MG PO TABS
1000.0000 mg | ORAL_TABLET | Freq: Four times a day (QID) | ORAL | Status: DC
  Administered 2021-04-22 – 2021-05-03 (×38): 1000 mg
  Filled 2021-04-21 (×37): qty 2

## 2021-04-21 MED ORDER — TRAMADOL HCL 50 MG PO TABS
50.0000 mg | ORAL_TABLET | Freq: Four times a day (QID) | ORAL | Status: DC | PRN
  Administered 2021-04-30 – 2021-05-02 (×3): 50 mg
  Filled 2021-04-21 (×3): qty 1

## 2021-04-21 MED ORDER — ACETAMINOPHEN 325 MG PO TABS: 650.0000 mg | ORAL_TABLET | Freq: Four times a day (QID) | ORAL | Status: DC | PRN

## 2021-04-21 MED ORDER — ONDANSETRON HCL 4 MG/2ML IJ SOLN: 4.0000 mg | Freq: Four times a day (QID) | INTRAMUSCULAR | Status: DC | PRN

## 2021-04-21 NOTE — Progress Notes (Signed)
PROGRESS NOTE    [name]   TKZ:[id number]  DOB: [birth year]  PCP: [name], DO    DOA: 04/24/2021 LOS: 3    Brief Narrative / Hospital Course to Date:   85 y.o. male with medical history significant of HTN; HLD; remote prostate CA; CAD s/p remote CABG; and abdominal wall melanoma presenting to [facility] on 04/17/21 with sepsis, presenting with fever, encephalopathy, respiratory distress and A-fib RVR.  Transferred to [facility] on 04/19/2021 for CT surgery coverage due to a mediastinal fluid collection associated with transverse sternal fracture.  Patient found to have Staph bacteremia with findings of pulmonary septic emboli on CT scan.  Infectious disease, CT surgery, neurosurgery, palliative care consulted.   Assessment & Plan    MRSA bacteremia with severe sepsis, present on admission -Sepsis POA with leukocytosis, tachycardia, tachypnea, hypoxia.  Evidence of acute organ failure with elevated lactate >2 that is not easily explained by another condition. Blood cultures were positive for MRSA. Septic emboli appreciated on imaging. Suspected source is unknown - PNA, spinal osteo, recent skin surgery are considerations (see below) Infectious disease was consulted.  Patient remains on vancomycin. Transthoracic echocardiogram showed normal systolic function.  No evidence for vegetation was noted. WBC is normal.  Remains afebrile.   Anterior mediastinal abscess/phlegmon  Mediastinal fluid collection with gas/fluid with transverse sternal fracture.  --CT surgery consulted --Patient taken for I&D on 11/8 and wound VAC placed Plan is to do dressing change in the OR on 11/10.   Subdural Hematoma (SDH) Patient has had recent SDH x 2, acute on chronic, seen at [facility].  Due to multiple falls.  -Also with compression fractures, ?osteomyelitis -Anticoagulation and ASA are contraindicated at this time   Right midlung PNA vs Septic Emboli  Appears to have septic emboli on  imaging. Treated with Vanc/Zosyn on admission. Zosyn d/c'd per ID Continue Vancomycin Respiratory status is stable.  Saturating normal on room air.  Hypokalemia  Repleted.  Magnesium 1.8.   New onset A-fib - POA, Likely triggered by infection.  Started on Cardizem drip on admission, continued. Anticoagulation contraindicated in setting of SDH. Heart rate is stable.  Very high risk for aspiration noted by nursing staff.  Speech therapy consult has been requested.  We will leave him on IV diltiazem for now.  Oropharyngeal dysphagia Speech therapy to see.  Aspiration precaution  Thoracolumbar compression fractures with ? Osteomyelitis  No surgical intervention planned.  Seen by neurosurgery.  On antibiotics per ID.   Abdominal wall melanoma Has steri-strips in place, no obvious infection.  Path reports from excision reflect negative margins. Monitor.   Left AC superficial thrombophlebitis  --Heat packs PRN --No DVT on imaging --Not a candidate for [facility] or ASA due to SDH  Macrocytic anemia Drop in hemoglobin likely dilutional.  We will recheck tomorrow.  Transfuse if it drops below 7 Check TSH.  No clear deficiencies identified on anemia panel last year.  We will repeat.   Essential hypertension  Norvasc and Cozaar on hold due to soft BPs   Hyperlipidemia Hold pravastatin for now with AMS, resume when safe to take PO   Remote Prostate CA  Bladder wall thickening noted on imaging.  Monitor for now.  Outpatient follow-up with urology   Coronary artery disease s/p remote CABG   Delirium Most likely due to infection, unclear if recent traumatic SDH contributing.  Hold sedating meds: Primidone, benadryl (consider d/c), Neurontin  Goals of care Palliative care has been consulted since patient's  prognosis appears to be poor.   DVT prophylaxis: SCDs CODE STATUS: DNR Family communication: No family at bedside Disposition: To be determined  Status is: Inpatient  Remains  inpatient appropriate because: Need for IV antibiotics and further surgical debridement       Subjective   Patient lethargic but easily arousable.  Does not appear to be in any distress.  Unable to provide any information.  Not communicative for most part.    Consults, Procedures, Significant Events   Consultants:  Infectious disease Palliative care Cardiothoracic surgery Neurosurgery  Procedures:    Antimicrobials:  Anti-infectives (From admission, onward)    Start     Dose/Rate Route Frequency Ordered Stop   04/19/21 0830  vancomycin (VANCOREADY) IVPB 1750 mg/350 mL        1,750 mg 175 mL/hr over 120 Minutes Intravenous Every 24 hours 04/19/21 0742     05/09/2021 2000  vancomycin (VANCOREADY) IVPB 1750 mg/350 mL  Status:  Discontinued        1,750 mg 175 mL/hr over 120 Minutes Intravenous Every 24 hours 04/14/2021 1007 04/30/2021 1706   05/09/2021 1400  piperacillin-tazobactam (ZOSYN) IVPB 3.375 g  Status:  Discontinued        3.375 g 12.5 mL/hr over 240 Minutes Intravenous Every 8 hours 04/27/2021 1007 04/19/21 0742         Micro    Objective   Vitals:   04/17/2021 2320 04/21/21 0401 04/21/21 0402 04/21/21 0812  BP: 126/78 (!) 145/77 (!) 145/77 129/74  Pulse: 76 98 100 100  Resp: 18 (!) 21 20 19   Temp: 98.8 F (37.1 C) 99 F (37.2 C)  98.9 F (37.2 C)  TempSrc: Axillary Oral  Axillary  SpO2: 96% 95% 95% 94%  Weight: 89.4 kg     Height: 6' 0.01" (1.829 m)       Intake/Output Summary (Last 24 hours) at 04/21/2021 1053 Last data filed at 04/21/2021 0500 Gross per 24 hour  Intake 2862.78 ml  Output 1685 ml  Net 1177.78 ml    Filed Weights   04/16/2021 0349 05/01/2021 2320  Weight: 89.4 kg 89.4 kg    Physical Exam:  General appearance: Lethargic but easily arousable.  Not very communicative. Resp: Normal effort at rest.  Diminished air entry at the bases.  No wheezing or rhonchi. Cardio: S1-S2 is normal regular.  No S3-S4.  No rubs murmurs or bruit GI:  Abdomen is soft.  Nontender nondistended.  Bowel sounds are present normal.  No masses organomegaly Extremities: No edema.   Neurologic:   No focal neurological deficits.    Labs   Data Reviewed: I have personally reviewed following labs and imaging studies  CBC: Recent Labs  Lab 04/17/21 0856 04/21/2021 0539 04/19/21 0256 04/19/21 1901 05/07/2021 0249 04/21/21 0623  WBC 10.8* 11.6* 10.8* 14.4* 13.0* 9.9  NEUTROABS 8.9* 10.2*  --   --   --   --   HGB 9.9* 8.9* 8.0* 8.2* 8.0* 7.5*  HCT 29.3* 27.3* 23.0* 24.0* 22.8* 22.7*  MCV 101.4* 106.6* 97.9 99.2 98.7 101.8*  PLT 303 296 352 447* 420* 495*    Basic Metabolic Panel: Recent Labs  Lab 04/30/2021 0539 04/19/21 0256 04/19/21 1901 05/11/2021 0603 05/05/2021 1439 04/21/21 0210  NA 132* 135 138 139  --  140  K 3.8 2.6* 2.7* 2.8* 3.8 3.8  CL 100 104 106 109  --  111  CO2 19* 21* 20* 20*  --  18*  GLUCOSE 131* 129* 141* 128*  --  141*  BUN 23 20 19 19   --  22  CREATININE 0.92 0.86 0.94 0.88  --  0.90  CALCIUM 8.9 9.0 9.6 9.7  --  9.3  MG  --  1.8  --  1.8  --  1.8    GFR: Estimated Creatinine Clearance: 68.3 mL/min (by C-G formula based on SCr of 0.9 mg/dL). Liver Function Tests: Recent Labs  Lab 04/17/21 0856 04/27/2021 0539 04/19/21 1901  AST 30 43* 182*  ALT 20 24 94*  ALKPHOS 63 54 107  BILITOT 1.1 1.3* 2.0*  PROT 6.8 6.0* 5.9*  ALBUMIN 2.7* 2.0* 1.8*     Coagulation Profile: Recent Labs  Lab 04/17/21 0856 04/19/21 1901  INR 1.2 1.9*    CBG: Recent Labs  Lab 04/17/21 2129  GLUCAP 126*    Sepsis Labs: Recent Labs  Lab 04/17/21 0856 04/17/21 0947 04/17/21 1158 04/28/2021 0539 05/10/2021 0737 05/02/2021 0603 04/21/21 0210  PROCALCITON  --   --   --   --  3.69 1.36 0.77  LATICACIDVEN 2.6* 2.9* 1.6 1.6  --   --   --      Recent Results (from the past 240 hour(s))  Resp Panel by RT-PCR (Flu A&B, Covid) Nasopharyngeal Swab     Status: None   Collection Time: 04/17/21  8:56 AM   Specimen:  Nasopharyngeal Swab; Nasopharyngeal(NP) swabs in vial transport medium  Result Value Ref Range Status   SARS Coronavirus 2 by RT PCR NEGATIVE NEGATIVE Final    Comment: (NOTE) SARS-CoV-2 target nucleic acids are NOT DETECTED.  The SARS-CoV-2 RNA is generally detectable in upper respiratory specimens during the acute phase of infection. The lowest concentration of SARS-CoV-2 viral copies this assay can detect is 138 copies/mL. A negative result does not preclude SARS-Cov-2 infection and should not be used as the sole basis for treatment or other patient management decisions. A negative result may occur with  improper specimen collection/handling, submission of specimen other than nasopharyngeal swab, presence of viral mutation(s) within the areas targeted by this assay, and inadequate number of viral copies(<138 copies/mL). A negative result must be combined with clinical observations, patient history, and epidemiological information. The expected result is Negative.  Fact Sheet for Patients:  EntrepreneurPulse.com.au  Fact Sheet for Healthcare Providers:  IncredibleEmployment.be  This test is no t yet approved or cleared by the [place] FDA and  has been authorized for detection and/or diagnosis of SARS-CoV-2 by FDA under an Emergency Use Authorization (EUA). This EUA will remain  in effect (meaning this test can be used) for the duration of the COVID-19 declaration under Section 564(b)(1) of the Act, 21 U.S.C.section 360bbb-3(b)(1), unless the authorization is terminated  or revoked sooner.       Influenza A by PCR NEGATIVE NEGATIVE Final   Influenza B by PCR NEGATIVE NEGATIVE Final    Comment: (NOTE) The Xpert Xpress SARS-CoV-2/FLU/RSV plus assay is intended as an aid in the diagnosis of influenza from Nasopharyngeal swab specimens and should not be used as a sole basis for treatment. Nasal washings and aspirates are unacceptable for  Xpert Xpress SARS-CoV-2/FLU/RSV testing.  Fact Sheet for Patients: EntrepreneurPulse.com.au  Fact Sheet for Healthcare Providers: IncredibleEmployment.be  This test is not yet approved or cleared by the [place] FDA and has been authorized for detection and/or diagnosis of SARS-CoV-2 by FDA under an Emergency Use Authorization (EUA). This EUA will remain in effect (meaning this test can be used) for the duration of the COVID-19 declaration under Section  564(b)(1) of the Act, 21 U.S.C. section 360bbb-3(b)(1), unless the authorization is terminated or revoked.  Performed at [facility], [address] [place], [place] [address]   Culture, blood (Routine x 2)     Status: Abnormal   Collection Time: 04/17/21  9:20 AM   Specimen: BLOOD  Result Value Ref Range Status   Specimen Description   Final    BLOOD RIGHT FA Performed at [facility], [address] [place], [place] [address]    Special Requests   Final    BOTTLES DRAWN AEROBIC AND ANAEROBIC Blood Culture adequate volume Performed at [facility], [address] [place], [place] [address]    Culture  Setup Time   Final    GRAM POSITIVE COCCI IN BOTH AEROBIC AND ANAEROBIC BOTTLES Organism ID to follow CRITICAL RESULT CALLED TO, READ BACK BY AND VERIFIED WITH: SAM RAUER 2203 04/17/21 LFD Performed at [facility], [address] [place], [place] [address]    Culture (A)  Final    STAPHYLOCOCCUS AUREUS SUSCEPTIBILITIES PERFORMED ON PREVIOUS CULTURE WITHIN THE LAST 5 DAYS. Performed at [facility], [place] [address] [place], [place] [address]    Report Status 05/11/2021 FINAL  Final  Blood Culture ID Panel (Reflexed)     Status: Abnormal   Collection Time: 04/17/21  9:20 AM  Result Value Ref Range Status   Enterococcus faecalis NOT DETECTED NOT DETECTED Final   Enterococcus Faecium NOT DETECTED NOT DETECTED Final   Listeria  monocytogenes NOT DETECTED NOT DETECTED Final   Staphylococcus species DETECTED (A) NOT DETECTED Final    Comment: CRITICAL RESULT CALLED TO, READ BACK BY AND VERIFIED WITH: SAM RAUER 2203 04/17/21 LFD    Staphylococcus aureus (BCID) DETECTED (A) NOT DETECTED Final    Comment: Methicillin (oxacillin)-resistant Staphylococcus aureus (MRSA). MRSA is predictably resistant to beta-lactam antibiotics (except ceftaroline). Preferred therapy is vancomycin unless clinically contraindicated. Patient requires contact precautions if  hospitalized. CRITICAL RESULT CALLED TO, READ BACK BY AND VERIFIED WITH: SAM RAUER 2203 04/17/21 LFD    Staphylococcus epidermidis NOT DETECTED NOT DETECTED Final   Staphylococcus lugdunensis NOT DETECTED NOT DETECTED Final   Streptococcus species NOT DETECTED NOT DETECTED Final   Streptococcus agalactiae NOT DETECTED NOT DETECTED Final   Streptococcus pneumoniae NOT DETECTED NOT DETECTED Final   Streptococcus pyogenes NOT DETECTED NOT DETECTED Final   A.calcoaceticus-baumannii NOT DETECTED NOT DETECTED Final   Bacteroides fragilis NOT DETECTED NOT DETECTED Final   Enterobacterales NOT DETECTED NOT DETECTED Final   Enterobacter cloacae complex NOT DETECTED NOT DETECTED Final   Escherichia coli NOT DETECTED NOT DETECTED Final   Klebsiella aerogenes NOT DETECTED NOT DETECTED Final   Klebsiella oxytoca NOT DETECTED NOT DETECTED Final   Klebsiella pneumoniae NOT DETECTED NOT DETECTED Final   Proteus species NOT DETECTED NOT DETECTED Final   Salmonella species NOT DETECTED NOT DETECTED Final   Serratia marcescens NOT DETECTED NOT DETECTED Final   Haemophilus influenzae NOT DETECTED NOT DETECTED Final   Neisseria meningitidis NOT DETECTED NOT DETECTED Final   Pseudomonas aeruginosa NOT DETECTED NOT DETECTED Final   Stenotrophomonas maltophilia NOT DETECTED NOT DETECTED Final   Candida albicans NOT DETECTED NOT DETECTED Final   Candida auris NOT DETECTED NOT DETECTED  Final   Candida glabrata NOT DETECTED NOT DETECTED Final   Candida krusei NOT DETECTED NOT DETECTED Final   Candida parapsilosis NOT DETECTED NOT DETECTED Final   Candida tropicalis NOT DETECTED NOT DETECTED Final   Cryptococcus  neoformans/gattii NOT DETECTED NOT DETECTED Final   Meth resistant mecA/C and MREJ DETECTED (A) NOT DETECTED Final    Comment: CRITICAL RESULT CALLED TO, READ BACK BY AND VERIFIED WITH: SAM RAUER 2203 04/17/21 LFD Performed at [facility], [address] [place], [place] [address]   Culture, blood (Routine x 2)     Status: Abnormal   Collection Time: 04/17/21  9:47 AM   Specimen: BLOOD  Result Value Ref Range Status   Specimen Description   Final    BLOOD RIGHT ARM Performed at [facility], [address], [place], [place] [address]    Special Requests   Final    BOTTLES DRAWN AEROBIC AND ANAEROBIC Blood Culture results may not be optimal due to an excessive volume of blood received in culture bottles Performed at [facility], [address] [place], [place] [address]    Culture  Setup Time   Final    GRAM POSITIVE COCCI IN BOTH AEROBIC AND ANAEROBIC BOTTLES CRITICAL VALUE NOTED.  VALUE IS CONSISTENT WITH PREVIOUSLY REPORTED AND CALLED VALUE. Performed at [facility], [address] [place], [place] [address]    Culture METHICILLIN RESISTANT STAPHYLOCOCCUS AUREUS (A)  Final   Report Status 04/16/2021 FINAL  Final   Organism ID, Bacteria METHICILLIN RESISTANT STAPHYLOCOCCUS AUREUS  Final      Susceptibility   Methicillin resistant staphylococcus aureus - MIC*    CIPROFLOXACIN >=8 RESISTANT Resistant     ERYTHROMYCIN <=0.25 SENSITIVE Sensitive     GENTAMICIN <=0.5 SENSITIVE Sensitive     OXACILLIN >=4 RESISTANT Resistant     TETRACYCLINE <=1 SENSITIVE Sensitive     VANCOMYCIN <=0.5 SENSITIVE Sensitive     TRIMETH/SULFA <=10 SENSITIVE Sensitive     CLINDAMYCIN <=0.25 SENSITIVE Sensitive     RIFAMPIN <=0.5  SENSITIVE Sensitive     Inducible Clindamycin NEGATIVE Sensitive     * METHICILLIN RESISTANT STAPHYLOCOCCUS AUREUS  Urine Culture     Status: Abnormal   Collection Time: 04/17/21 11:58 AM   Specimen: Urine, Random  Result Value Ref Range Status   Specimen Description   Final    URINE, RANDOM Performed at [facility], [address], [place], [place] [address]    Special Requests   Final    NONE Performed at [facility], [address] [place], [place] [address]    Culture MULTIPLE SPECIES PRESENT, SUGGEST RECOLLECTION (A)  Final   Report Status 04/19/2021 FINAL  Final  Culture, blood (routine x 2)     Status: None (Preliminary result)   Collection Time: 04/19/21  7:55 AM   Specimen: BLOOD LEFT WRIST  Result Value Ref Range Status   Specimen Description BLOOD LEFT WRIST  Final   Special Requests   Final    BOTTLES DRAWN AEROBIC AND ANAEROBIC Blood Culture adequate volume   Culture   Final    NO GROWTH 2 DAYS Performed at [facility], [place] [address] [place],  [address]    Report Status PENDING  Incomplete  Culture, blood (routine x 2)     Status: None (Preliminary result)   Collection Time: 04/19/21  8:02 AM   Specimen: BLOOD LEFT HAND  Result Value Ref Range Status   Specimen Description BLOOD LEFT HAND  Final   Special Requests   Final    BOTTLES DRAWN AEROBIC AND ANAEROBIC Blood Culture adequate volume   Culture   Final    NO GROWTH 2 DAYS Performed at [facility], [place] [address] [place], [place] [address]    Report Status PENDING  Incomplete  MRSA Next Gen by PCR, Nasal     Status: Abnormal   Collection Time: 04/19/21 11:13 AM   Specimen: Nasal Mucosa; Nasal Swab  Result Value Ref Range Status   MRSA by PCR Next Gen DETECTED (A) NOT DETECTED Final    Comment: RESULT CALLED TO, READ BACK BY AND VERIFIED WITH: D WHITE RN 1503 04/19/21 A BROWNING (NOTE) The GeneXpert MRSA Assay (FDA approved for NASAL specimens only), is  one component of a comprehensive MRSA colonization surveillance program. It is not intended to diagnose MRSA infection nor to guide or monitor treatment for MRSA infections. Test performance is not FDA approved in patients less than 48 years old. Performed at [facility], [place] [address], [place], [place] [address]   Aerobic/Anaerobic Culture w Gram Stain (surgical/deep wound)     Status: None (Preliminary result)   Collection Time: 04/19/2021  8:23 AM   Specimen: Wound; Body Fluid  Result Value Ref Range Status   Specimen Description WOUND  Final   Special Requests  STERNAL WOUND  Final   Gram Stain   Final    FEW WBC PRESENT,BOTH PMN AND MONONUCLEAR FEW GRAM POSITIVE COCCI IN PAIRS Performed at [facility], [address], [place], [place] [address]    Culture PENDING  Incomplete   Report Status PENDING  Incomplete  Aerobic/Anaerobic Culture w Gram Stain (surgical/deep wound)     Status: None (Preliminary result)   Collection Time: 04/19/2021  8:27 AM   Specimen: PATH Bone biopsy; Tissue  Result Value Ref Range Status   Specimen Description TISSUE  Final   Special Requests  STERNUM  Final   Gram Stain   Final    FEW WBC PRESENT,BOTH PMN AND MONONUCLEAR FEW GRAM POSITIVE COCCI IN PAIRS Performed at [facility], [address], [place], [place] [address]    Culture PENDING  Incomplete   Report Status PENDING  Incomplete       Imaging Studies   DG Chest 2 View  Result Date: 04/19/2021 CLINICAL DATA:  Preop EXAM: CHEST - 2 VIEW COMPARISON:  04/17/2021, 08/17/2020 FINDINGS: Post sternotomy changes. Stable cardiomediastinal silhouette allowing for patient rotation. Aortic atherosclerosis. Cavitary foci of airspace disease within the upper lobes. Possible small pleural effusions. Sternal deformity/fracture on the CT is better seen on CT. IMPRESSION: 1. Cavitary foci of airspace disease within the upper lungs, corresponding to suspected septic emboli on CT 2.  Probable small pleural effusions. Hazy atelectasis or pneumonia at the medial left base. Electronically Signed   By: Donavan Foil M.D.   On: 04/19/2021 21:15   ECHOCARDIOGRAM COMPLETE  Result Date: 04/19/2021    ECHOCARDIOGRAM REPORT   Patient Name:   TAESEAN RETH Date of Exam: 04/19/2021 Medical Rec #:  [id number]        Height:       72.0 in Accession #:    [id number]       Weight:       191.1 lb Date of Birth:  03-08-1936        BSA:          2.090 m Patient Age:    60 years         BP:           145/72 mmHg Patient Gender: M  HR:           107 bpm. Exam Location:  Inpatient Procedure: 2D Echo, Cardiac Doppler and Color Doppler Indications:    Dyspnea  History:        Patient has prior history of Echocardiogram examinations, most                 recent 08/13/2020. Previous Myocardial Infarction.  Sonographer:    Merrie Roof RDCS Referring Phys: [place]  1. Left ventricular ejection fraction, by estimation, is 60 to 65%. The left ventricle has normal function. The left ventricle has no regional wall motion abnormalities. Left ventricular diastolic parameters are indeterminate.  2. Right ventricular systolic function is normal. The right ventricular size is normal.  3. Left atrial size was mildly dilated.  4. Right atrial size was mildly dilated.  5. The mitral valve is degenerative. Trivial mitral valve regurgitation. No evidence of mitral stenosis. Moderate mitral annular calcification.  6. The aortic valve is tricuspid. Aortic valve regurgitation is not visualized. Mild to moderate aortic valve sclerosis/calcification is present, without any evidence of aortic stenosis.  7. The inferior vena cava is normal in size with greater than 50% respiratory variability, suggesting right atrial pressure of 3 mmHg. FINDINGS  Left Ventricle: Left ventricular ejection fraction, by estimation, is 60 to 65%. The left ventricle has normal function. The left ventricle has no regional  wall motion abnormalities. The left ventricular internal cavity size was normal in size. There is  no left ventricular hypertrophy. Left ventricular diastolic parameters are indeterminate. Right Ventricle: The right ventricular size is normal. No increase in right ventricular wall thickness. Right ventricular systolic function is normal. Left Atrium: Left atrial size was mildly dilated. Right Atrium: Right atrial size was mildly dilated. Pericardium: There is no evidence of pericardial effusion. Mitral Valve: The mitral valve is degenerative in appearance. There is moderate thickening of the mitral valve leaflet(s). There is moderate calcification of the mitral valve leaflet(s). Moderate mitral annular calcification. Trivial mitral valve regurgitation. No evidence of mitral valve stenosis. Tricuspid Valve: The tricuspid valve is normal in structure. Tricuspid valve regurgitation is not demonstrated. No evidence of tricuspid stenosis. Aortic Valve: The aortic valve is tricuspid. Aortic valve regurgitation is not visualized. Mild to moderate aortic valve sclerosis/calcification is present, without any evidence of aortic stenosis. Pulmonic Valve: The pulmonic valve was normal in structure. Pulmonic valve regurgitation is not visualized. No evidence of pulmonic stenosis. Aorta: The aortic root is normal in size and structure. Venous: The inferior vena cava is normal in size with greater than 50% respiratory variability, suggesting right atrial pressure of 3 mmHg. IAS/Shunts: The interatrial septum was not well visualized.  LEFT VENTRICLE PLAX 2D LVIDd:         4.59 cm   Diastology LVIDs:         3.07 cm   LV e' medial:  12.20 cm/s LV PW:         0.88 cm   LV e' lateral: 15.90 cm/s LV IVS:        1.02 cm LVOT diam:     2.20 cm LV SV:         82 LV SV Index:   39 LVOT Area:     3.80 cm  LEFT ATRIUM             Index LA diam:        4.00 cm 1.91 cm/m LA Vol (A2C):   91.1 ml 43.59 ml/m  LA Vol (A4C):   86.5 ml 41.39 ml/m  LA Biplane Vol: 90.0 ml 43.06 ml/m  AORTIC VALVE LVOT Vmax:   137.00 cm/s LVOT Vmean:  103.000 cm/s LVOT VTI:    0.216 m  AORTA Ao Root diam: 3.50 cm  SHUNTS Systemic VTI:  0.22 m Systemic Diam: 2.20 cm Jenkins Rouge MD Electronically signed by Jenkins Rouge MD Signature Date/Time: 04/19/2021/3:54:24 PM    Final      Medications   Scheduled Meds:  acetaminophen  1,000 mg Oral Q6H   azelastine  1 spray Each Nare BID   chlorhexidine  15 mL Mouth Rinse BID   Chlorhexidine Gluconate Cloth  6 each Topical Daily   docusate sodium  100 mg Oral BID   mouth rinse  15 mL Mouth Rinse q12n4p   sodium chloride flush  3 mL Intravenous Q12H   Continuous Infusions:  diltiazem (CARDIZEM) infusion 15 mg/hr (04/21/21 0226)   lactated ringers 100 mL/hr at 04/21/21 0229   vancomycin 1,750 mg (04/21/21 0814)       LOS: 3 days    [place] Haff,  [facility]  04/21/2021, 10:53 AM

## 2021-04-21 NOTE — Progress Notes (Signed)
SLP Cancellation Note  Patient Details Name: [name] MRN: [id number] DOB: 1936-01-24   Cancelled treatment:       Reason Eval/Treat Not Completed: Other (comment) (Pt's case discussed with RN who reported that the pt is currently upset since recent Cortrak placement and requested that the eval be deferred today. SLP will follow up on subsequent date.)  Caia Lofaro I. Hardin Negus, [place], [place] Office number [phone] Pager [place] 04/21/2021, 3:58 PM

## 2021-04-21 NOTE — Progress Notes (Signed)
Initial Nutrition Assessment  DOCUMENTATION CODES:   Severe malnutrition in context of chronic illness  INTERVENTION:   Recommend Cortrak tube placement for enteral nutrition supplementation. Discussed with attending physician who agrees. Cortrak order placed.  When Cortrak placement confirmed, start TF: Jevity 1.5 at 25 ml/h, increase by 10 ml every 8 hours to goal rate of 65 ml/h  Prosource TF 45 ml BID  Provides 2420 kcal, 122 gm protein, 1186 ml free water daily Monitor magnesium, potassium, and phosphorus BID for at least 3 days, MD to replete as needed, as pt is at risk for refeeding syndrome given severe malnutrition with minimal intake since admission.  NUTRITION DIAGNOSIS:   Severe Malnutrition related to chronic illness (CAD) as evidenced by severe fat depletion, severe muscle depletion.  GOAL:   Patient will meet greater than or equal to 90% of their needs  MONITOR:   PO intake, Supplement acceptance, Labs, Skin  REASON FOR ASSESSMENT:   Malnutrition Screening Tool    ASSESSMENT:   85 yo male admitted with severe sepsis, MRSA bacteremia, sternal wound infection. PMH includes HTN, HLD, remote prostate CA, CAD s/p remote CABG, abd wall melanoma, DDD, lumbar compression fracture, L great toe amputation.  S/P I&D of sternal wound infection 11/8 with wound VAC placement.   Plans for VAC change and sternal debridement in OR tomorrow. Patient will be NPO after midnight.   Patient was non-communicative during RD visit and during NFPE.  Labs and medications reviewed.  Weight history reviewed.  4% weight loss within the past 3 months.  Currently on a dysphagia 1 diet with thin liquids.  Meal intakes not recorded, suspect intake has been inadequate.  Per RN note, patient lacks awareness and ability to drink through a straw or from a cup.  Patient would benefit from a Cortrak feeding tube to supplement poor oral intake.   Palliative Care team following.    NUTRITION - FOCUSED PHYSICAL EXAM:  Flowsheet Row Most Recent Value  Orbital Region Severe depletion  Upper Arm Region Moderate depletion  Thoracic and Lumbar Region Moderate depletion  Buccal Region Severe depletion  Temple Region Severe depletion  Clavicle Bone Region Severe depletion  Clavicle and Acromion Bone Region Moderate depletion  Scapular Bone Region Moderate depletion  Dorsal Hand Mild depletion  Patellar Region Mild depletion  Anterior Thigh Region Mild depletion  Posterior Calf Region Mild depletion  Edema (RD Assessment) Mild  Hair Reviewed  Eyes Unable to assess  Mouth Reviewed  Skin Reviewed  Nails Reviewed       Diet Order:   Diet Order             DIET - DYS 1 Room service appropriate? Yes; Fluid consistency: Thin  Diet effective now                   EDUCATION NEEDS:   No education needs have been identified at this time  Skin:  Skin Assessment: Reviewed RN Assessment  Last BM:  no BM documented  Height:   Ht Readings from Last 1 Encounters:  04/15/2021 6' 0.01" (1.829 m)    Weight:   Wt Readings from Last 1 Encounters:  05/01/2021 89.4 kg    BMI:  Body mass index is 26.72 kg/m.  Estimated Nutritional Needs:   Kcal:  2200-2400  Protein:  120-130 gm  Fluid:  2.2-2.4 L    Lucas Mallow, RD, LDN, CNSC Please refer to Amion for contact information.

## 2021-04-21 NOTE — Op Note (Signed)
NAME: [name], [name] RECORD NO: [id number] ACCOUNT NO: [id number] DATE OF BIRTH: 11/13/1935 FACILITY: [facility] LOCATION: [facility] PHYSICIAN: Revonda Standard. Roxan Hockey, MD  Operative Report   DATE OF PROCEDURE: 04/16/2021  PREOPERATIVE DIAGNOSIS:  Chest wall abscess.  POSTOPERATIVE DIAGNOSIS:  Infected sternal fracture with chest wall and anterior mediastinal abscess.  PROCEDURE:  Incision and drainage of chest wall and anterior mediastinal abscess and sternal debridement.  SURGEON: Revonda Standard. Roxan Hockey, MD  ANESTHESIA: general  CLINICAL NOTE:  [name] is an 85 year old gentleman who was admitted with altered mental status.  He was found to be septic. Workup revealed an sternal fracture, likely with abscess involving the anterior chest wall and extending into the anterior  mediastinum.  The patient was unable to give consent.  His wife was advised that he should undergo incision and drainage and VAC placement for treatment of the abscess.  The indications, risks, benefits, and alternatives were discussed in detail with the  patient.  She understood the high risk nature of the procedure and she gave consent to proceed.  DESCRIPTION OF PROCEDURE:  [name] was brought to the operating room on 04/19/2021.  He had induction of general anesthesia and was intubated.  Foley catheter was placed.  Sequential compression devices were placed on the calves for DVT prophylaxis.   The chest was prepped and draped in the usual sterile fashion.  Intravenous antibiotics were administered.  A timeout was performed.  An incision was made through the patient's previous sternotomy incision scar.  A large amount of pus was encountered in the subcutaneous tissue.  This was sent for culture.  The pus was evacuated and then there was a gap in the sternum  where pus could be seen coming from in the mediastinum.  The sternal wire at the site as well as the one above and below were removed.  A rongeur was used to debride  the sternum and open the anterior mediastinum and pus was evacuated from that area, but  minimal debridement was performed in the mediastinum.  Sternum was debrided back to viable bone.  The subcutaneous tissue, which was necrotic was debrided back to viable tissue.  The wound was copiously irrigated with saline.  The total length of the wound was  11 cm and the width was 5 cm.  The portion of the sternum that was debrided was approximately 3 cm in length.  A white sponge was placed into the anterior mediastinum and then the standard VAC sponge was used at the sternum and then the subcutaneous  tissue. Vacuum was applied.  There was a good seal.  The patient then was extubated in the operating room and taken to the postanesthetic care unit in fair condition.   NIK D: 04/16/2021 5:22:59 pm T: 04/21/2021 12:09:00 am  JOB: [id number]/ [id number]

## 2021-04-21 NOTE — Progress Notes (Addendum)
      [address]       [place] [address]             [phone]      1 Day Post-Op Procedure(s) (LRB): STERNAL WOUND IRRIGATION & DEBRIDEMENT (N/A) APPLICATION OF WOUND VAC (N/A) Subjective: Sleeping but awakened easily.  Denies pain.   Objective: Vital signs in last 24 hours: Temp:  [97.6 F (36.4 C)-99.2 F (37.3 C)] 99 F (37.2 C) (11/09 0401) Pulse Rate:  [76-101] 100 (11/09 0402) Cardiac Rhythm: Atrial fibrillation (11/08 1947) Resp:  [17-25] 20 (11/09 0402) BP: (125-149)/(65-86) 145/77 (11/09 0402) SpO2:  [95 %-99 %] 95 % (11/09 0402) Weight:  [89.4 kg] 89.4 kg (11/08 2320)     Intake/Output from previous day: 11/08 0701 - 11/09 0700 In: 3262.8 [I.V.:3262.8] Out: 2135 [Urine:1935; Drains:100; Blood:100] Intake/Output this shift: No intake/output data recorded.  General appearance: cooperative and no distress Heart: A-fib with VR 100 Wound: Wound vac in place to sternal wound and functioning appropriately. 115ml serosanguinous drainage since placement yesterday  Lab Results: Recent Labs    04/21/2021 0249 04/21/21 0623  WBC 13.0* 9.9  HGB 8.0* 7.5*  HCT 22.8* 22.7*  PLT 420* 495*   BMET:  Recent Labs    05/08/2021 0603 05/07/2021 1439 04/21/21 0210  NA 139  --  140  K 2.8* 3.8 3.8  CL 109  --  111  CO2 20*  --  18*  GLUCOSE 128*  --  141*  BUN 19  --  22  CREATININE 0.88  --  0.90  CALCIUM 9.7  --  9.3    PT/INR:  Recent Labs    04/19/21 1901  LABPROT 22.1*  INR 1.9*   ABG    Component Value Date/Time   PHART 7.443 04/19/2021 1854   HCO3 19.9 (L) 04/19/2021 1854   ACIDBASEDEF 3.4 (H) 04/19/2021 1854   O2SAT 95.6 04/19/2021 1854   CBG (last 3)  No results for input(s): GLUCAP in the last 72 hours.  Assessment/Plan: S/P Procedure(s) (LRB): STERNAL WOUND IRRIGATION & DEBRIDEMENT (N/A) APPLICATION OF WOUND VAC (N/A)  -POD1 incision and excisional debridement of chest wall abscess and placement of wound vac.  OR  Grams stain ->Gm+ cocci in pairs, culture pending. On Vanc for previously cultured MRSA (from blood 11/5). Continue negative pressure therapy, plan return to OR tomorrow or Friday for dressing change.   -Anemia-minimal ongoing losses from the surgical site. Approaching threshold for transfusion, will defer to primary team.     LOS: 3 days    Joline Maxcy [phone] 04/21/2021 Patient seen and examined, agree with above Likely needs feeding tube Will plan for VAC change, sternal debridement in OR tomorrow afternoon. Hopefully after that they VAC sponge changes can be made at bedside  Remo Lipps C. Roxan Hockey, MD [facility] [phone]

## 2021-04-21 NOTE — Procedures (Signed)
Cortrak  Tube Type:  Cortrak - 43 inches Tube Location:  Right nare Initial Placement:  Stomach Secured by: Bridle Technique Used to Measure Tube Placement:  Marking at nare/corner of mouth Cortrak Secured At:  65 cm  Cortrak Tube Team Note:  Consult received to place a Cortrak feeding tube.   X-ray is required, abdominal x-ray has been ordered by the Cortrak team. Please confirm tube placement before using the Cortrak tube.   If the tube becomes dislodged please keep the tube and contact the Cortrak team at [email or web address] ([email or web address]) for replacement.  If after hours and replacement cannot be delayed, place a NG tube and confirm placement with an abdominal x-ray.    Koleen Distance MS, RD, LDN Please refer to [facility] for RD and/or RD on-call/weekend/after hours pager

## 2021-04-21 NOTE — Progress Notes (Signed)
Pharmacy Antibiotic Note  [name] is a 85 y.o. male admitted on 04/27/2021 with bacteremia.  Pharmacy has been consulted for Vanco dosing.  ID: MRSA bacteremia with pulmonary septic emboli, WBC 9.9 down slightly., Tm 99.2, ID following - CT with pulm nodules concerning for septic emboli - Sternal fracture with phlegmon s/p I&D 11/8 - questionable T12 vertebral osteo - TTE negative for vegetations  Zosyn 11/5 > 11/7 Vanc 11/5 > accidentally d/c'd restarted 11/7 >> - only rec'd 750 mg of 2g load @ [place] 11/5 (lost access)  11/5 BCx: 4/4 staph aureus w/ MecA/C and MREJ resistance  11/5 UCx: mult sp  11/7 BCx: ngtd 11/8 Sternal tissue, mod SA 11/8: Sternal wound fluid: mod SA   Plan: Vancomycin 1750 mg every 24 hours re-started 11/7 AM  - Estimated AUC 506 Ordered VT for 11/10- Will order VP tomorrow after dose is given    Height: 6' 0.01" (182.9 cm) Weight: 89.4 kg (197 lb 1.5 oz) IBW/kg (Calculated) : 77.62  Temp (24hrs), Avg:98.9 F (37.2 C), Min:98.7 F (37.1 C), Max:99.2 F (37.3 C)  Recent Labs  Lab 04/17/21 0856 04/17/21 0947 04/17/21 1158 04/25/2021 0539 04/19/21 0256 04/19/21 1901 04/24/2021 0249 05/11/2021 0603 04/21/21 0210 04/21/21 0623  WBC 10.8*  --   --  11.6* 10.8* 14.4* 13.0*  --   --  9.9  CREATININE 1.07  --   --  0.92 0.86 0.94  --  0.88 0.90  --   LATICACIDVEN 2.6* 2.9* 1.6 1.6  --   --   --   --   --   --     Estimated Creatinine Clearance: 68.3 mL/min (by C-G formula based on SCr of 0.9 mg/dL).    Allergies  Allergen Reactions   Codeine Nausea And Vomiting   Indocin [Indomethacin] Other (See Comments)   Latex Other (See Comments)    Blisters   Mupirocin     Blisters   Plavix [Clopidogrel Bisulfate]     GI intolerance, n/v   Polysporin [Bacitracin-Polymyxin B]     Blisters   Shellfish Allergy Nausea And Vomiting    Mussels   Tape    Neosporin [Neomycin-Bacitracin Zn-Polymyx] Rash   Other Nausea And Vomiting    mussels     Raeven Pint S. Alford Highland, PharmD, BCPS Clinical Staff Pharmacist [email or web address]  Wayland Salinas 04/21/2021 12:44 PM

## 2021-04-21 NOTE — Progress Notes (Signed)
Patient lacks awareness and ability to drink through a straw or from a cup. RN held PO medications and provided mouth care. RN messaged MD regarding poor PO intake, diet order, and SLP recommendation.  Edwena Blow, RN

## 2021-04-22 ENCOUNTER — Inpatient Hospital Stay (HOSPITAL_COMMUNITY): Payer: Medicare Other | Admitting: Anesthesiology

## 2021-04-22 ENCOUNTER — Encounter (HOSPITAL_COMMUNITY): Payer: Self-pay | Admitting: Internal Medicine

## 2021-04-22 ENCOUNTER — Encounter (HOSPITAL_COMMUNITY): Admission: AD | Disposition: E | Payer: Self-pay | Attending: Internal Medicine

## 2021-04-22 DIAGNOSIS — L02213 Cutaneous abscess of chest wall: Secondary | ICD-10-CM

## 2021-04-22 DIAGNOSIS — L089 Local infection of the skin and subcutaneous tissue, unspecified: Secondary | ICD-10-CM | POA: Diagnosis not present

## 2021-04-22 DIAGNOSIS — I10 Essential (primary) hypertension: Secondary | ICD-10-CM | POA: Diagnosis not present

## 2021-04-22 DIAGNOSIS — I4891 Unspecified atrial fibrillation: Secondary | ICD-10-CM | POA: Diagnosis not present

## 2021-04-22 DIAGNOSIS — S065XAA Traumatic subdural hemorrhage with loss of consciousness status unknown, initial encounter: Secondary | ICD-10-CM | POA: Diagnosis not present

## 2021-04-22 DIAGNOSIS — M4624 Osteomyelitis of vertebra, thoracic region: Secondary | ICD-10-CM | POA: Diagnosis not present

## 2021-04-22 DIAGNOSIS — A4102 Sepsis due to Methicillin resistant Staphylococcus aureus: Secondary | ICD-10-CM | POA: Diagnosis not present

## 2021-04-22 DIAGNOSIS — R7881 Bacteremia: Secondary | ICD-10-CM | POA: Diagnosis not present

## 2021-04-22 DIAGNOSIS — S21101A Unspecified open wound of right front wall of thorax without penetration into thoracic cavity, initial encounter: Secondary | ICD-10-CM | POA: Diagnosis not present

## 2021-04-22 HISTORY — PX: APPLICATION OF WOUND VAC: SHX5189

## 2021-04-22 HISTORY — PX: STERNAL WOUND DEBRIDEMENT: SHX1058

## 2021-04-22 LAB — IRON AND TIBC
Iron: 49 ug/dL (ref 45–182)
Saturation Ratios: 24 % (ref 17.9–39.5)
TIBC: 206 ug/dL — ABNORMAL LOW (ref 250–450)
UIBC: 157 ug/dL

## 2021-04-22 LAB — BASIC METABOLIC PANEL
Anion gap: 9 (ref 5–15)
BUN: 23 mg/dL (ref 8–23)
CO2: 24 mmol/L (ref 22–32)
Calcium: 9.4 mg/dL (ref 8.9–10.3)
Chloride: 108 mmol/L (ref 98–111)
Creatinine, Ser: 0.7 mg/dL (ref 0.61–1.24)
GFR, Estimated: >60 mL/min (ref >60)
Glucose, Bld: 124 mg/dL — ABNORMAL HIGH (ref 70–99)
Potassium: 2.8 mmol/L — ABNORMAL LOW (ref 3.5–5.1)
Sodium: 141 mmol/L (ref 135–145)

## 2021-04-22 LAB — T4, FREE: Free T4: 1 ng/dL (ref 0.61–1.12)

## 2021-04-22 LAB — CBC
HCT: 26.1 % — ABNORMAL LOW (ref 39.0–52.0)
Hemoglobin: 8.8 g/dL — ABNORMAL LOW (ref 13.0–17.0)
MCH: 34.5 pg — ABNORMAL HIGH (ref 26.0–34.0)
MCHC: 33.7 g/dL (ref 30.0–36.0)
MCV: 102.4 fL — ABNORMAL HIGH (ref 80.0–100.0)
Platelets: 582 10*3/uL — ABNORMAL HIGH (ref 150–400)
RBC: 2.55 MIL/uL — ABNORMAL LOW (ref 4.22–5.81)
RDW: 14.7 % (ref 11.5–15.5)
WBC: 12.7 10*3/uL — ABNORMAL HIGH (ref 4.0–10.5)
nRBC: 0.6 % — ABNORMAL HIGH (ref 0.0–0.2)

## 2021-04-22 LAB — RETICULOCYTES
Immature Retic Fract: 43.5 % — ABNORMAL HIGH (ref 2.3–15.9)
RBC.: 2.56 MIL/uL — ABNORMAL LOW (ref 4.22–5.81)
Retic Count, Absolute: 71.9 10*3/uL (ref 19.0–186.0)
Retic Ct Pct: 2.8 % (ref 0.4–3.1)

## 2021-04-22 LAB — MAGNESIUM
Magnesium: 1.8 mg/dL (ref 1.7–2.4)
Magnesium: 1.6 mg/dL — ABNORMAL LOW (ref 1.7–2.4)

## 2021-04-22 LAB — FOLATE: Folate: 25.4 ng/mL (ref >5.9)

## 2021-04-22 LAB — VANCOMYCIN, PEAK: Vancomycin Pk: 39 ug/mL (ref 30–40)

## 2021-04-22 LAB — PHOSPHORUS
Phosphorus: 3.4 mg/dL (ref 2.5–4.6)
Phosphorus: 3.5 mg/dL (ref 2.5–4.6)

## 2021-04-22 LAB — GLUCOSE, CAPILLARY
Glucose-Capillary: 124 mg/dL — ABNORMAL HIGH (ref 70–99)
Glucose-Capillary: 115 mg/dL — ABNORMAL HIGH (ref 70–99)
Glucose-Capillary: 107 mg/dL — ABNORMAL HIGH (ref 70–99)
Glucose-Capillary: 122 mg/dL — ABNORMAL HIGH (ref 70–99)
Glucose-Capillary: 124 mg/dL — ABNORMAL HIGH (ref 70–99)

## 2021-04-22 LAB — VANCOMYCIN, TROUGH: Vancomycin Tr: 20 ug/mL (ref 15–20)

## 2021-04-22 LAB — FERRITIN: Ferritin: 741 ng/mL — ABNORMAL HIGH (ref 24–336)

## 2021-04-22 LAB — VITAMIN B12: Vitamin B-12: >7500 pg/mL — ABNORMAL HIGH (ref 180–914)

## 2021-04-22 LAB — TSH: TSH: 1.245 u[IU]/mL (ref 0.350–4.500)

## 2021-04-22 SURGERY — DEBRIDEMENT, WOUND, STERNUM
Anesthesia: General

## 2021-04-22 MED ORDER — LIDOCAINE HCL (CARDIAC) PF 100 MG/5ML IV SOSY
PREFILLED_SYRINGE | INTRAVENOUS | Status: DC | PRN
  Administered 2021-04-22: 80 mg via INTRATRACHEAL

## 2021-04-22 MED ORDER — ONDANSETRON HCL 4 MG/2ML IJ SOLN
INTRAMUSCULAR | Status: DC | PRN
  Administered 2021-04-22: 4 mg via INTRAVENOUS

## 2021-04-22 MED ORDER — PROPOFOL 10 MG/ML IV BOLUS
INTRAVENOUS | Status: DC | PRN
  Administered 2021-04-22: 100 mg via INTRAVENOUS

## 2021-04-22 MED ORDER — LACTATED RINGERS IV SOLN: INTRAVENOUS | Status: DC

## 2021-04-22 MED ORDER — PROPOFOL 10 MG/ML IV BOLUS
INTRAVENOUS | Status: AC
  Filled 2021-04-22: qty 20

## 2021-04-22 MED ORDER — PHENYLEPHRINE HCL-NACL 20-0.9 MG/250ML-% IV SOLN
INTRAVENOUS | Status: DC | PRN
  Administered 2021-04-22: 50 ug/min via INTRAVENOUS

## 2021-04-22 MED ORDER — SUCCINYLCHOLINE CHLORIDE 200 MG/10ML IV SOSY
PREFILLED_SYRINGE | INTRAVENOUS | Status: DC | PRN
  Administered 2021-04-22: 100 mg via INTRAVENOUS

## 2021-04-22 MED ORDER — CHLORHEXIDINE GLUCONATE 0.12 % MT SOLN
15.0000 mL | Freq: Once | OROMUCOSAL | Status: AC
  Administered 2021-04-22: 15 mL via OROMUCOSAL

## 2021-04-22 MED ORDER — POTASSIUM CHLORIDE IN NACL 20-0.45 MEQ/L-% IV SOLN
INTRAVENOUS | Status: DC
  Filled 2021-04-22 (×6): qty 1000

## 2021-04-22 MED ORDER — SUGAMMADEX SODIUM 200 MG/2ML IV SOLN
INTRAVENOUS | Status: DC | PRN
  Administered 2021-04-22: 200 mg via INTRAVENOUS

## 2021-04-22 MED ORDER — FENTANYL CITRATE (PF) 250 MCG/5ML IJ SOLN
INTRAMUSCULAR | Status: AC
  Filled 2021-04-22: qty 5

## 2021-04-22 MED ORDER — FENTANYL CITRATE (PF) 100 MCG/2ML IJ SOLN
INTRAMUSCULAR | Status: DC | PRN
  Administered 2021-04-22: 100 ug via INTRAVENOUS
  Administered 2021-04-22: 50 ug via INTRAVENOUS

## 2021-04-22 MED ORDER — ORAL CARE MOUTH RINSE: 15.0000 mL | Freq: Once | OROMUCOSAL | Status: AC

## 2021-04-22 MED ORDER — POTASSIUM CHLORIDE 20 MEQ PO PACK
40.0000 meq | PACK | Freq: Once | ORAL | Status: AC
  Administered 2021-04-22: 40 meq
  Filled 2021-04-22: qty 2

## 2021-04-22 MED ORDER — METOPROLOL TARTRATE 5 MG/5ML IV SOLN
5.0000 mg | Freq: Once | INTRAVENOUS | Status: AC
  Administered 2021-04-22: 5 mg via INTRAVENOUS
  Filled 2021-04-22: qty 5

## 2021-04-22 MED ORDER — ROCURONIUM BROMIDE 100 MG/10ML IV SOLN
INTRAVENOUS | Status: DC | PRN
  Administered 2021-04-22: 40 mg via INTRAVENOUS

## 2021-04-22 MED ORDER — MAGNESIUM SULFATE 4 GM/100ML IV SOLN
4.0000 g | Freq: Once | INTRAVENOUS | Status: AC
  Administered 2021-04-23: 4 g via INTRAVENOUS
  Filled 2021-04-22: qty 100

## 2021-04-22 MED ORDER — VANCOMYCIN HCL 1250 MG/250ML IV SOLN
1250.0000 mg | INTRAVENOUS | Status: DC
  Administered 2021-04-23 – 2021-04-27 (×5): 1250 mg via INTRAVENOUS
  Filled 2021-04-22 (×5): qty 250

## 2021-04-22 MED ORDER — POTASSIUM CHLORIDE 10 MEQ/100ML IV SOLN
10.0000 meq | INTRAVENOUS | Status: AC
  Administered 2021-04-22 (×4): 10 meq via INTRAVENOUS
  Filled 2021-04-22 (×3): qty 100

## 2021-04-22 MED ORDER — PHENYLEPHRINE HCL (PRESSORS) 10 MG/ML IV SOLN
INTRAVENOUS | Status: DC | PRN
  Administered 2021-04-22 (×2): 80 ug via INTRAVENOUS
  Administered 2021-04-22: 120 ug via INTRAVENOUS

## 2021-04-22 MED ORDER — PHENYLEPHRINE HCL (PRESSORS) 10 MG/ML IV SOLN
INTRAVENOUS | Status: AC
  Filled 2021-04-22: qty 2

## 2021-04-22 SURGICAL SUPPLY — 43 items
BLADE CLIPPER SURG (BLADE) ×2 IMPLANT
BLADE SURG 10 STRL SS (BLADE) ×2 IMPLANT
CANISTER PREVENA 45 (CANNISTER) IMPLANT
CANISTER SUCT 3000ML PPV (MISCELLANEOUS) ×2 IMPLANT
CANISTER WOUNDNEG PRESSURE 500 (CANNISTER) ×2 IMPLANT
CNTNR URN SCR LID CUP LEK RST (MISCELLANEOUS) IMPLANT
CONT SPEC 4OZ STRL OR WHT (MISCELLANEOUS)
DRAPE LAPAROSCOPIC ABDOMINAL (DRAPES) ×2 IMPLANT
DRAPE WARM FLUID 44X44 (DRAPES) IMPLANT
DRSG PAD ABDOMINAL 8X10 ST (GAUZE/BANDAGES/DRESSINGS) IMPLANT
DRSG VERAFLO VAC MED (GAUZE/BANDAGES/DRESSINGS) ×2 IMPLANT
DRSG VERSA FOAM LRG 10X15 (GAUZE/BANDAGES/DRESSINGS) ×2 IMPLANT
ELECT REM PT RETURN 9FT ADLT (ELECTROSURGICAL) ×2
ELECTRODE REM PT RTRN 9FT ADLT (ELECTROSURGICAL) ×1 IMPLANT
GAUZE SPONGE 4X4 12PLY STRL (GAUZE/BANDAGES/DRESSINGS) ×2 IMPLANT
GLOVE SURG SIGNA 7.5 PF LTX (GLOVE) ×4 IMPLANT
GOWN STRL REUS W/ TWL LRG LVL3 (GOWN DISPOSABLE) ×2 IMPLANT
GOWN STRL REUS W/ TWL XL LVL3 (GOWN DISPOSABLE) ×1 IMPLANT
GOWN STRL REUS W/TWL LRG LVL3 (GOWN DISPOSABLE) ×2
GOWN STRL REUS W/TWL XL LVL3 (GOWN DISPOSABLE) ×1
HANDPIECE INTERPULSE COAX TIP (DISPOSABLE)
HEMOSTAT POWDER SURGIFOAM 1G (HEMOSTASIS) IMPLANT
KIT BASIN OR (CUSTOM PROCEDURE TRAY) ×2 IMPLANT
KIT TURNOVER KIT B (KITS) ×2 IMPLANT
NS IRRIG 1000ML POUR BTL (IV SOLUTION) ×4 IMPLANT
PACK CHEST (CUSTOM PROCEDURE TRAY) ×2 IMPLANT
PAD ARMBOARD 7.5X6 YLW CONV (MISCELLANEOUS) ×4 IMPLANT
SET HNDPC FAN SPRY TIP SCT (DISPOSABLE) IMPLANT
SPONGE T-LAP 18X18 ~~LOC~~+RFID (SPONGE) ×2 IMPLANT
SUT STEEL 6MS V (SUTURE) IMPLANT
SUT STEEL STERNAL CCS#1 18IN (SUTURE) IMPLANT
SUT STEEL SZ 6 DBL 3X14 BALL (SUTURE) IMPLANT
SUT VIC AB 1 CTX 36 (SUTURE) ×2
SUT VIC AB 1 CTX36XBRD ANBCTR (SUTURE) ×2 IMPLANT
SUT VIC AB 2-0 CTX 27 (SUTURE) ×4 IMPLANT
SUT VIC AB 3-0 X1 27 (SUTURE) ×4 IMPLANT
SWAB COLLECTION DEVICE MRSA (MISCELLANEOUS) IMPLANT
SWAB CULTURE ESWAB REG 1ML (MISCELLANEOUS) IMPLANT
SYR 5ML LL (SYRINGE) IMPLANT
TOWEL GREEN STERILE (TOWEL DISPOSABLE) ×2 IMPLANT
TOWEL GREEN STERILE FF (TOWEL DISPOSABLE) ×2 IMPLANT
TRAY FOLEY MTR SLVR 14FR STAT (SET/KITS/TRAYS/PACK) IMPLANT
WATER STERILE IRR 1000ML POUR (IV SOLUTION) ×2 IMPLANT

## 2021-04-22 NOTE — Anesthesia Procedure Notes (Signed)
Procedure Name: Intubation Date/Time: 04/25/2021 7:19 PM Performed by: Valetta Fuller, CRNA Pre-anesthesia Checklist: Patient identified, Emergency Drugs available, Suction available and Patient being monitored Patient Re-evaluated:Patient Re-evaluated prior to induction Oxygen Delivery Method: Circle system utilized Preoxygenation: Pre-oxygenation with 100% oxygen Induction Type: IV induction and Rapid sequence Laryngoscope Size: Miller and 2 Grade View: Grade I Tube type: Oral Tube size: 7.5 mm Number of attempts: 1 Airway Equipment and Method: Stylet Placement Confirmation: ETT inserted through vocal cords under direct vision, positive ETCO2 and breath sounds checked- equal and bilateral Secured at: 23 cm Tube secured with: Tape Dental Injury: Teeth and Oropharynx as per pre-operative assessment

## 2021-04-22 NOTE — Op Note (Signed)
NAME: [name], [name] RECORD NO: [id number] ACCOUNT NO: [id number] DATE OF BIRTH: 22-Apr-1936 FACILITY: [facility] LOCATION: [facility] PHYSICIAN: Revonda Standard. Roxan Hockey, MD  Operative Report   DATE OF PROCEDURE: 04/25/2021   PREOPERATIVE DIAGNOSIS:  Chest wall abscess secondary to infected sternal fracture.  POSTOPERATIVE DIAGNOSIS:  Chest wall abscess secondary to infected sternal fracture.  PROCEDURE:  Wound VAC change under anesthesia with excisional debridement.  SURGEON:  Revonda Standard. Roxan Hockey, MD  ASSISTANT:  None.  ANESTHESIA:  General.  FINDINGS: Granulating over about 90% of the surface area, some necrotic debris debrided.  Excisional debridement of exposed costal cartilage.  CLINICAL NOTE:  [name] is an 17 year Marcus gentleman who had undergone incision and drainage of chest wall abscess involving a sternal fracture with extension into the anterior mediastinum.  He now needs his initial wound VAC sponge change and his  family was advised that he should do that under anesthesia and so any additional debridement could be performed.  The indications, risks, benefits, and alternatives were discussed with the patient's wife.  She gave her permission to proceed.  DESCRIPTION OF PROCEDURE:  [name] was brought to the operating room on 04/21/2021.  He was already receiving intravenous antibiotics, so no additional antibiotics were given.  He was anesthetized and intubated.  The dressing over the wound VAC was  removed and the skin was prepped and draped in the usual sterile fashion around the wound.  A timeout was performed.  The sponges that had been placed previously were removed.  The majority of the tissue was healthy and granulating.  There were some small areas of necrotic tissue along the sternum.  One of the costal cartilages was  exposed and this was debrided back with a rongeur.  There was a very small amount of necrotic subcutaneous tissue, which was excised back to healthy  bleeding tissue.  The wound was copiously irrigated with saline.  Wound VAC sponges were cut to fit and  the occlusive dressing was applied.  Suction was applied and there was a good seal.  The patient was transported from the operating room to the postanesthetic care unit in good condition.     SUJ D: 04/24/2021 8:01:13 pm T: 05/05/2021 10:11:00 pm  JOB: [id number]/ [id number]

## 2021-04-22 NOTE — Transfer of Care (Signed)
Immediate Anesthesia Transfer of Care Note  Patient: [name]  Procedure(s) Performed: STERNAL WOUND WASHOUT WOUND VAC CHANGE  Patient Location: PACU  Anesthesia Type:General  Level of Consciousness: awake, alert  and confused  Airway & Oxygen Therapy: Patient connected to nasal cannula oxygen  Post-op Assessment: Report given to RN and Post -op Vital signs reviewed and stable  Post vital signs: Reviewed and stable  Last Vitals:  Vitals Value Taken Time  BP 140/75   Temp    Pulse 39 04/17/2021 2009  Resp 19 04/16/2021 2009  SpO2 95 % 04/13/2021 2009  Vitals shown include unvalidated device data.  Last Pain:  Vitals:   04/28/2021 1811  TempSrc: Axillary  PainSc:          Complications: No notable events documented.

## 2021-04-22 NOTE — Progress Notes (Signed)
SLP Cancellation Note  Patient Details Name: [name] MRN: [id number] DOB: 12/05/1935   Cancelled treatment:       Reason Eval/Treat Not Completed: Medical issues which prohibited therapy (Per RN, pt is currently NPO for I&D which is scheduled for today at 30. SLP will therefore have to follow up on subsequent date for swallow evaluation.)  Cristan Scherzer I. Hardin Negus, [place], [place] Office number [phone] Pager [place] 04/17/2021, 9:42 AM

## 2021-04-22 NOTE — Progress Notes (Signed)
PROGRESS NOTE    [name]   GYF:[id number]  DOB: 1935-10-04  PCP: [name], DO    DOA: 04/23/2021 LOS: 4    Brief Narrative / Hospital Course to Date:   85 y.o. male with medical history significant of HTN; HLD; remote prostate CA; CAD s/p remote CABG; and abdominal wall melanoma presenting to [facility] on 04/17/21 with sepsis, presenting with fever, encephalopathy, respiratory distress and A-fib RVR.  Transferred to [facility] on 05/09/2021 for CT surgery coverage due to a mediastinal fluid collection associated with transverse sternal fracture.  Patient found to have Staph bacteremia with findings of pulmonary septic emboli on CT scan.  Infectious disease, CT surgery, neurosurgery, palliative care consulted.   Assessment & Plan    MRSA bacteremia with severe sepsis, present on admission Sepsis POA with leukocytosis, tachycardia, tachypnea, hypoxia.  Evidence of acute organ failure with elevated lactate >2 that is not easily explained by another condition. Blood cultures were positive for MRSA. Septic emboli appreciated on imaging. Suspected source is unknown - PNA, spinal osteo, recent skin surgery are considerations (see below) Infectious disease was consulted.  Patient remains on vancomycin. Transthoracic echocardiogram showed normal systolic function.  No evidence for vegetation was noted. Remains afebrile.  Continue current management for now.   Anterior mediastinal abscess/phlegmon  Mediastinal fluid collection with gas/fluid with transverse sternal fracture.  Cardiothoracic surgery was consulted. Patient taken for I&D on 11/8 and wound VAC placed. Plan is to do dressing change in the OR on 11/10.   Subdural Hematoma (SDH) Patient has had recent SDH x 2, acute on chronic, seen at [facility].  This was secondary to multiple falls. Also with compression fractures, ?osteomyelitis -Anticoagulation and ASA are contraindicated at this time.   Right midlung PNA vs Septic  Emboli  Appears to have septic emboli on imaging. Treated with Vanc/Zosyn on admission. Zosyn d/c'd per ID Continue Vancomycin Respiratory status remains stable.  Hypokalemia  Replace potassium.  Add potassium to IV fluids.  Magnesium 1.8.     New onset A-fib - POA, Likely triggered by infection.  Remains on Cardizem infusion with stable heart rate. Anticoagulation contraindicated in setting of SDH. Heart rate is stable.   Since he now has feeding to could transition him from IV Cardizem to enteral Cardizem.  Oropharyngeal dysphagia Speech therapy to see.  Aspiration precaution  Nutrition Due to extremely poor oral intake cortrak feeding tube was placed yesterday.  Thoracolumbar compression fractures with ? Osteomyelitis  No surgical intervention planned.  Seen by neurosurgery.  On antibiotics per ID.   Abdominal wall melanoma Has steri-strips in place, no obvious infection.  Path reports from excision reflect negative margins. Monitor.   Left AC superficial thrombophlebitis  Conservative management.  No DVT is noted on imaging studies.  Macrocytic anemia Drop in hemoglobin likely dilutional.  Hemoglobin noted to be stable today.  Anemia panel reviewed.  No clear-cut deficiencies identified.  TSH is noted to be 1.2 with a normal free T4.   Essential hypertension  Norvasc and Cozaar on hold due to soft BPs   Hyperlipidemia Hold pravastatin for now with AMS, resume when safe to take PO  Remote Prostate CA  Bladder wall thickening noted on imaging.  Monitor for now.  Outpatient follow-up with urology   Coronary artery disease s/p remote CABG   Delirium Most likely due to infection, unclear if recent traumatic SDH contributing.  Hold sedating meds: Primidone, benadryl (consider d/c), Neurontin  Goals of care Palliative care has  been consulted since patient's prognosis appears to be poor.  Discussed with patient's wife yesterday.  Patient has a son who lives out of  town.  Patient's wife was told about guarded to poor prognosis especially if he does not start eating and drinking on his own.  She understands.  Would like to see how he does in the next 2 to 3 days.   DVT prophylaxis: SCDs CODE STATUS: DNR Family communication: No family at bedside Disposition: To be determined  Status is: Inpatient  Remains inpatient appropriate because: Need for IV antibiotics and further surgical debridement       Subjective   Remains lethargic.  Opens his eyes but does not communicate.    Consults, Procedures, Significant Events   Consultants:  Infectious disease Palliative care Cardiothoracic surgery Neurosurgery  Procedures:  Irrigation and drainage of the mediastinal abscess.  Antimicrobials:  Anti-infectives (From admission, onward)    Start     Dose/Rate Route Frequency Ordered Stop   04/19/21 0830  vancomycin (VANCOREADY) IVPB 1750 mg/350 mL        1,750 mg 175 mL/hr over 120 Minutes Intravenous Every 24 hours 04/19/21 0742     05/09/2021 2000  vancomycin (VANCOREADY) IVPB 1750 mg/350 mL  Status:  Discontinued        1,750 mg 175 mL/hr over 120 Minutes Intravenous Every 24 hours 05/04/2021 1007 04/21/2021 1706   04/17/2021 1400  piperacillin-tazobactam (ZOSYN) IVPB 3.375 g  Status:  Discontinued        3.375 g 12.5 mL/hr over 240 Minutes Intravenous Every 8 hours 04/16/2021 1007 04/19/21 0742         Micro    Objective   Vitals:   04/21/21 1951 04/21/21 2337 05/11/2021 0408 04/17/2021 0748  BP: (!) 142/87 130/82 129/87 138/87  Pulse: 100 96 97 99  Resp: 18 14 17 18   Temp: 98 F (36.7 C) 98.3 F (36.8 C) 97.7 F (36.5 C) 97.7 F (36.5 C)  TempSrc: Axillary Axillary Axillary Axillary  SpO2: 93% 100% 97% 97%  Weight:   88.8 kg   Height:        Intake/Output Summary (Last 24 hours) at 04/24/2021 0904 Last data filed at 04/27/2021 4166 Gross per 24 hour  Intake 3887.81 ml  Output 2600 ml  Net 1287.81 ml    Filed Weights    04/19/2021 0349 04/14/2021 2320 05/08/2021 0408  Weight: 89.4 kg 89.4 kg 88.8 kg    Physical Exam:  General appearance: Remains lethargic for the most part but easily arousable.  In no distress NG tube is noted. Resp: Normal effort.  Coarse breath sounds bilaterally.  No wheezing or rhonchi. Cardio: S1-S2 is irregularly irregular GI: Abdomen is soft.  Nontender nondistended.  Bowel sounds are present normal.  No masses organomegaly Extremities: No edema.  Noted to be moving all of his extremities Neurologic:  No focal neurological deficits.     Labs   Data Reviewed: I have personally reviewed following labs and imaging studies  CBC: Recent Labs  Lab 04/17/21 0856 05/10/2021 0539 04/19/21 0256 04/19/21 1901 05/08/2021 0249 04/21/21 0623 04/25/2021 0540  WBC 10.8* 11.6* 10.8* 14.4* 13.0* 9.9 12.7*  NEUTROABS 8.9* 10.2*  --   --   --   --   --   HGB 9.9* 8.9* 8.0* 8.2* 8.0* 7.5* 8.8*  HCT 29.3* 27.3* 23.0* 24.0* 22.8* 22.7* 26.1*  MCV 101.4* 106.6* 97.9 99.2 98.7 101.8* 102.4*  PLT 303 296 352 447* 420* 495* 582*  Basic Metabolic Panel: Recent Labs  Lab 04/19/21 0256 04/19/21 1901 05/05/2021 0603 04/14/2021 1439 04/21/21 0210 04/21/21 1602 04/21/2021 0540  NA 135 138 139  --  140  --  141  K 2.6* 2.7* 2.8* 3.8 3.8  --  2.8*  CL 104 106 109  --  111  --  108  CO2 21* 20* 20*  --  18*  --  24  GLUCOSE 129* 141* 128*  --  141*  --  124*  BUN 20 19 19   --  22  --  23  CREATININE 0.86 0.94 0.88  --  0.90  --  0.70  CALCIUM 9.0 9.6 9.7  --  9.3  --  9.4  MG 1.8  --  1.8  --  1.8 1.8 1.8  PHOS  --   --   --   --   --  3.2 3.4    GFR: Estimated Creatinine Clearance: 76.8 mL/min (by C-G formula based on SCr of 0.7 mg/dL). Liver Function Tests: Recent Labs  Lab 04/17/21 0856 04/17/2021 0539 04/19/21 1901  AST 30 43* 182*  ALT 20 24 94*  ALKPHOS 63 54 107  BILITOT 1.1 1.3* 2.0*  PROT 6.8 6.0* 5.9*  ALBUMIN 2.7* 2.0* 1.8*     Coagulation Profile: Recent Labs  Lab  04/17/21 0856 04/19/21 1901  INR 1.2 1.9*    CBG: Recent Labs  Lab 04/17/21 2129 04/21/21 1954 04/21/21 2336 04/30/2021 0406 04/30/2021 0746  GLUCAP 126* 129* 150* 124* 115*    Sepsis Labs: Recent Labs  Lab 04/17/21 0856 04/17/21 0947 04/17/21 1158 04/16/2021 0539 04/25/2021 0737 04/13/2021 0603 04/21/21 0210  PROCALCITON  --   --   --   --  3.69 1.36 0.77  LATICACIDVEN 2.6* 2.9* 1.6 1.6  --   --   --      Recent Results (from the past 240 hour(s))  Resp Panel by RT-PCR (Flu A&B, Covid) Nasopharyngeal Swab     Status: None   Collection Time: 04/17/21  8:56 AM   Specimen: Nasopharyngeal Swab; Nasopharyngeal(NP) swabs in vial transport medium  Result Value Ref Range Status   SARS Coronavirus 2 by RT PCR NEGATIVE NEGATIVE Final    Comment: (NOTE) SARS-CoV-2 target nucleic acids are NOT DETECTED.  The SARS-CoV-2 RNA is generally detectable in upper respiratory specimens during the acute phase of infection. The lowest concentration of SARS-CoV-2 viral copies this assay can detect is 138 copies/mL. A negative result does not preclude SARS-Cov-2 infection and should not be used as the sole basis for treatment or other patient management decisions. A negative result may occur with  improper specimen collection/handling, submission of specimen other than nasopharyngeal swab, presence of viral mutation(s) within the areas targeted by this assay, and inadequate number of viral copies(<138 copies/mL). A negative result must be combined with clinical observations, patient history, and epidemiological information. The expected result is Negative.  Fact Sheet for Patients:  EntrepreneurPulse.com.au  Fact Sheet for Healthcare Providers:  IncredibleEmployment.be  This test is no t yet approved or cleared by the [place] FDA and  has been authorized for detection and/or diagnosis of SARS-CoV-2 by FDA under an Emergency Use Authorization (EUA).  This EUA will remain  in effect (meaning this test can be used) for the duration of the COVID-19 declaration under Section 564(b)(1) of the Act, 21 U.S.C.section 360bbb-3(b)(1), unless the authorization is terminated  or revoked sooner.       Influenza A by PCR NEGATIVE NEGATIVE Final  Influenza B by PCR NEGATIVE NEGATIVE Final    Comment: (NOTE) The Xpert Xpress SARS-CoV-2/FLU/RSV plus assay is intended as an aid in the diagnosis of influenza from Nasopharyngeal swab specimens and should not be used as a sole basis for treatment. Nasal washings and aspirates are unacceptable for Xpert Xpress SARS-CoV-2/FLU/RSV testing.  Fact Sheet for Patients: EntrepreneurPulse.com.au  Fact Sheet for Healthcare Providers: IncredibleEmployment.be  This test is not yet approved or cleared by the [place] FDA and has been authorized for detection and/or diagnosis of SARS-CoV-2 by FDA under an Emergency Use Authorization (EUA). This EUA will remain in effect (meaning this test can be used) for the duration of the COVID-19 declaration under Section 564(b)(1) of the Act, 21 U.S.C. section 360bbb-3(b)(1), unless the authorization is terminated or revoked.  Performed at [facility], [address] [place], [place] [address]   Culture, blood (Routine x 2)     Status: Abnormal   Collection Time: 04/17/21  9:20 AM   Specimen: BLOOD  Result Value Ref Range Status   Specimen Description   Final    BLOOD RIGHT FA Performed at [facility], [address] [place], [place] [address]    Special Requests   Final    BOTTLES DRAWN AEROBIC AND ANAEROBIC Blood Culture adequate volume Performed at [facility], [address] [place], [place] [address]    Culture  Setup Time   Final    GRAM POSITIVE COCCI IN BOTH AEROBIC AND ANAEROBIC BOTTLES Organism ID to follow CRITICAL RESULT CALLED TO, READ BACK BY AND VERIFIED WITH: SAM  RAUER 2203 04/17/21 LFD Performed at [facility], [address] [place], [place] [address]    Culture (A)  Final    STAPHYLOCOCCUS AUREUS SUSCEPTIBILITIES PERFORMED ON PREVIOUS CULTURE WITHIN THE LAST 5 DAYS. Performed at [facility], [place] [address] [place], [place] [address]    Report Status 05/10/2021 FINAL  Final  Blood Culture ID Panel (Reflexed)     Status: Abnormal   Collection Time: 04/17/21  9:20 AM  Result Value Ref Range Status   Enterococcus faecalis NOT DETECTED NOT DETECTED Final   Enterococcus Faecium NOT DETECTED NOT DETECTED Final   Listeria monocytogenes NOT DETECTED NOT DETECTED Final   Staphylococcus species DETECTED (A) NOT DETECTED Final    Comment: CRITICAL RESULT CALLED TO, READ BACK BY AND VERIFIED WITH: SAM RAUER 2203 04/17/21 LFD    Staphylococcus aureus (BCID) DETECTED (A) NOT DETECTED Final    Comment: Methicillin (oxacillin)-resistant Staphylococcus aureus (MRSA). MRSA is predictably resistant to beta-lactam antibiotics (except ceftaroline). Preferred therapy is vancomycin unless clinically contraindicated. Patient requires contact precautions if  hospitalized. CRITICAL RESULT CALLED TO, READ BACK BY AND VERIFIED WITH: SAM RAUER 2203 04/17/21 LFD    Staphylococcus epidermidis NOT DETECTED NOT DETECTED Final   Staphylococcus lugdunensis NOT DETECTED NOT DETECTED Final   Streptococcus species NOT DETECTED NOT DETECTED Final   Streptococcus agalactiae NOT DETECTED NOT DETECTED Final   Streptococcus pneumoniae NOT DETECTED NOT DETECTED Final   Streptococcus pyogenes NOT DETECTED NOT DETECTED Final   A.calcoaceticus-baumannii NOT DETECTED NOT DETECTED Final   Bacteroides fragilis NOT DETECTED NOT DETECTED Final   Enterobacterales NOT DETECTED NOT DETECTED Final   Enterobacter cloacae complex NOT DETECTED NOT DETECTED Final   Escherichia coli NOT DETECTED NOT DETECTED Final   Klebsiella aerogenes NOT DETECTED NOT DETECTED Final    Klebsiella oxytoca NOT DETECTED NOT DETECTED Final   Klebsiella pneumoniae NOT DETECTED NOT DETECTED Final  Proteus species NOT DETECTED NOT DETECTED Final   Salmonella species NOT DETECTED NOT DETECTED Final   Serratia marcescens NOT DETECTED NOT DETECTED Final   Haemophilus influenzae NOT DETECTED NOT DETECTED Final   Neisseria meningitidis NOT DETECTED NOT DETECTED Final   Pseudomonas aeruginosa NOT DETECTED NOT DETECTED Final   Stenotrophomonas maltophilia NOT DETECTED NOT DETECTED Final   Candida albicans NOT DETECTED NOT DETECTED Final   Candida auris NOT DETECTED NOT DETECTED Final   Candida glabrata NOT DETECTED NOT DETECTED Final   Candida krusei NOT DETECTED NOT DETECTED Final   Candida parapsilosis NOT DETECTED NOT DETECTED Final   Candida tropicalis NOT DETECTED NOT DETECTED Final   Cryptococcus neoformans/gattii NOT DETECTED NOT DETECTED Final   Meth resistant mecA/C and MREJ DETECTED (A) NOT DETECTED Final    Comment: CRITICAL RESULT CALLED TO, READ BACK BY AND VERIFIED WITH: SAM RAUER 2203 04/17/21 LFD Performed at [facility], [address] [place], [place] [address]   Culture, blood (Routine x 2)     Status: Abnormal   Collection Time: 04/17/21  9:47 AM   Specimen: BLOOD  Result Value Ref Range Status   Specimen Description   Final    BLOOD RIGHT ARM Performed at [facility], [address] [place], [place] [address]    Special Requests   Final    BOTTLES DRAWN AEROBIC AND ANAEROBIC Blood Culture results may not be optimal due to an excessive volume of blood received in culture bottles Performed at [facility], [address] [place], [place] [address]    Culture  Setup Time   Final    GRAM POSITIVE COCCI IN BOTH AEROBIC AND ANAEROBIC BOTTLES CRITICAL VALUE NOTED.  VALUE IS CONSISTENT WITH PREVIOUSLY REPORTED AND CALLED VALUE. Performed at [facility], [address] [place], [place] [address]    Culture  METHICILLIN RESISTANT STAPHYLOCOCCUS AUREUS (A)  Final   Report Status 04/16/2021 FINAL  Final   Organism ID, Bacteria METHICILLIN RESISTANT STAPHYLOCOCCUS AUREUS  Final      Susceptibility   Methicillin resistant staphylococcus aureus - MIC*    CIPROFLOXACIN >=8 RESISTANT Resistant     ERYTHROMYCIN <=0.25 SENSITIVE Sensitive     GENTAMICIN <=0.5 SENSITIVE Sensitive     OXACILLIN >=4 RESISTANT Resistant     TETRACYCLINE <=1 SENSITIVE Sensitive     VANCOMYCIN <=0.5 SENSITIVE Sensitive     TRIMETH/SULFA <=10 SENSITIVE Sensitive     CLINDAMYCIN <=0.25 SENSITIVE Sensitive     RIFAMPIN <=0.5 SENSITIVE Sensitive     Inducible Clindamycin NEGATIVE Sensitive     * METHICILLIN RESISTANT STAPHYLOCOCCUS AUREUS  Urine Culture     Status: Abnormal   Collection Time: 04/17/21 11:58 AM   Specimen: Urine, Random  Result Value Ref Range Status   Specimen Description   Final    URINE, RANDOM Performed at [facility], [address], [place], [place] [address]    Special Requests   Final    NONE Performed at [facility], [address], [place], [place] [address]    Culture MULTIPLE SPECIES PRESENT, SUGGEST RECOLLECTION (A)  Final   Report Status 04/19/2021 FINAL  Final  Culture, blood (routine x 2)     Status: None (Preliminary result)   Collection Time: 04/19/21  7:55 AM   Specimen: BLOOD LEFT WRIST  Result Value Ref Range Status   Specimen Description BLOOD LEFT WRIST  Final   Special Requests   Final    BOTTLES DRAWN AEROBIC  AND ANAEROBIC Blood Culture adequate volume   Culture   Final    NO GROWTH 3 DAYS Performed at [facility], [place] [address] [place], [place] [address]    Report Status PENDING  Incomplete  Culture, blood (routine x 2)     Status: None (Preliminary result)   Collection Time: 04/19/21  8:02 AM   Specimen: BLOOD LEFT HAND  Result Value Ref Range Status   Specimen Description BLOOD LEFT HAND  Final   Special Requests   Final    BOTTLES  DRAWN AEROBIC AND ANAEROBIC Blood Culture adequate volume   Culture   Final    NO GROWTH 3 DAYS Performed at [facility], [place] [address], [place], [place] [address]    Report Status PENDING  Incomplete  MRSA Next Gen by PCR, Nasal     Status: Abnormal   Collection Time: 04/19/21 11:13 AM   Specimen: Nasal Mucosa; Nasal Swab  Result Value Ref Range Status   MRSA by PCR Next Gen DETECTED (A) NOT DETECTED Final    Comment: RESULT CALLED TO, READ BACK BY AND VERIFIED WITH: D WHITE RN 1503 04/19/21 A BROWNING (NOTE) The GeneXpert MRSA Assay (FDA approved for NASAL specimens only), is one component of a comprehensive MRSA colonization surveillance program. It is not intended to diagnose MRSA infection nor to guide or monitor treatment for MRSA infections. Test performance is not FDA approved in patients less than 19 years old. Performed at [facility], [place] [address], [place], [place] [address]   Aerobic/Anaerobic Culture w Gram Stain (surgical/deep wound)     Status: None (Preliminary result)   Collection Time: 05/12/2021  8:23 AM   Specimen: Wound; Body Fluid  Result Value Ref Range Status   Specimen Description WOUND  Final   Special Requests  STERNAL WOUND  Final   Gram Stain   Final    FEW WBC PRESENT,BOTH PMN AND MONONUCLEAR FEW GRAM POSITIVE COCCI IN PAIRS    Culture   Final    MODERATE STAPHYLOCOCCUS AUREUS SUSCEPTIBILITIES TO FOLLOW Performed at [facility], [place] [address] [place], [place] [address]    Report Status PENDING  Incomplete  Aerobic/Anaerobic Culture w Gram Stain (surgical/deep wound)     Status: None (Preliminary result)   Collection Time: 04/21/2021  8:27 AM   Specimen: PATH Bone biopsy; Tissue  Result Value Ref Range Status   Specimen Description TISSUE  Final   Special Requests  STERNUM  Final   Gram Stain   Final    FEW WBC PRESENT,BOTH PMN AND MONONUCLEAR FEW GRAM POSITIVE COCCI IN PAIRS    Culture   Final    MODERATE  STAPHYLOCOCCUS AUREUS SUSCEPTIBILITIES TO FOLLOW Performed at [facility], [place] [address] [place], [place] [address]    Report Status PENDING  Incomplete       Imaging Studies   DG Abd Portable 1V  Result Date: 04/21/2021 CLINICAL DATA:  Feeding tube placement EXAM: PORTABLE ABDOMEN - 1 VIEW COMPARISON:  None. FINDINGS: Feeding tube with the tip projecting over the antrum of the stomach. Gaseous distension of the small bowel and colon. No evidence of pneumoperitoneum, portal venous gas or pneumatosis. No pathologic calcifications along the expected course of the ureters. No acute osseous abnormality. IMPRESSION: Feeding tube with the tip projecting over the antrum of the stomach. Electronically Signed   By: Kathreen Devoid M.D.   On: 04/21/2021 16:29     Medications  Scheduled Meds:  acetaminophen  1,000 mg Per Tube Q6H   azelastine  1 spray Each Nare BID   chlorhexidine  15 mL Mouth Rinse BID   Chlorhexidine Gluconate Cloth  6 each Topical Daily   docusate  100 mg Per Tube BID   feeding supplement (PROSource TF)  45 mL Per Tube BID   mouth rinse  15 mL Mouth Rinse q12n4p   sodium chloride flush  3 mL Intravenous Q12H   Continuous Infusions:  0.45 % NaCl with KCl 20 mEq / L 50 mL/hr at 04/30/2021 0800   diltiazem (CARDIZEM) infusion 15 mg/hr (04/25/2021 0618)   feeding supplement (JEVITY 1.5 CAL/FIBER) Stopped (04/25/2021 0000)   potassium chloride 10 mEq (04/17/2021 0800)   vancomycin 1,750 mg (04/16/2021 0806)       LOS: 4 days    Bonnielee Haff,  [facility]  04/19/2021, 9:04 AM

## 2021-04-22 NOTE — Brief Op Note (Signed)
04/25/2021 - 04/21/2021  7:47 PM  PATIENT:  [name]  85 y.o. male  PRE-OPERATIVE DIAGNOSIS:  STERNAL WOUND INFECTION  POST-OPERATIVE DIAGNOSIS:  STERNAL WOUND INFECTION  PROCEDURE:  Procedure(s): STERNAL WOUND WASHOUT (N/A) WOUND VAC CHANGE (N/A) Excisional debridement  SURGEON:  Surgeon(s) and Role:    * Melrose Nakayama, MD - Primary  PHYSICIAN ASSISTANT:   ASSISTANTS: none   ANESTHESIA:   general  EBL:  minimal   BLOOD ADMINISTERED:none  DRAINS:  VAC to chest wound    LOCAL MEDICATIONS USED:  NONE  SPECIMEN:  No Specimen  DISPOSITION OF SPECIMEN:  N/A  COUNTS:  YES  TOURNIQUET:  * No tourniquets in log *  DICTATION: .Other Dictation: Dictation Number -  PLAN OF CARE:  return to inpatient  PATIENT DISPOSITION:  PACU - hemodynamically stable.   Delay start of Pharmacological VTE agent (>24hrs) due to surgical blood loss or risk of bleeding: no

## 2021-04-22 NOTE — Anesthesia Preprocedure Evaluation (Addendum)
Anesthesia Evaluation  Patient identified by MRN, date of birth, ID band Patient confused    Reviewed: Allergy & Precautions, NPO status , Patient's Chart, lab work & pertinent test results  Airway Mallampati: II  TM Distance: >3 FB Neck ROM: Full    Dental  (+) Poor Dentition   Pulmonary neg pulmonary ROS,     + decreased breath sounds      Cardiovascular hypertension, Pt. on medications and Pt. on home beta blockers + CAD, + Past MI, + CABG and +CHF   Rhythm:Irregular Rate:Tachycardia     Neuro/Psych Dementia negative psych ROS   GI/Hepatic Neg liver ROS, GERD  Medicated,  Endo/Other  negative endocrine ROS  Renal/GU negative Renal ROS  negative genitourinary   Musculoskeletal  (+) Arthritis , Osteoarthritis,    Abdominal Normal abdominal exam  (+)   Peds  Hematology  (+) anemia ,   Anesthesia Other Findings   Reproductive/Obstetrics                            Anesthesia Physical  Anesthesia Plan  ASA: 3  Anesthesia Plan: General   Post-op Pain Management:    Induction: Intravenous  PONV Risk Score and Plan: 2 and Ondansetron, Dexamethasone and Treatment may vary due to age or medical condition  Airway Management Planned: Mask and Oral ETT  Additional Equipment: None  Intra-op Plan:   Post-operative Plan: Possible Post-op intubation/ventilation  Informed Consent: I have reviewed the patients History and Physical, chart, labs and discussed the procedure including the risks, benefits and alternatives for the proposed anesthesia with the patient or authorized representative who has indicated his/her understanding and acceptance.   Patient has DNR.   Dental advisory given  Plan Discussed with: CRNA  Anesthesia Plan Comments: (- S/P fall with sternal wound fx and infection. Baseline cognitive dysfunction poorly responsive to commands beyond name.   Lab Results       Component                Value               Date                      WBC                      12.7 (H)            04/13/2021                HGB                      8.8 (L)             05/03/2021                HCT                      26.1 (L)            04/19/2021                MCV                      102.4 (H)           04/23/2021                PLT  582 (H)             04/25/2021           Lab Results      Component                Value               Date                      NA                       141                 04/25/2021                K                        2.8 (L)             05/08/2021                CO2                      24                  05/11/2021                GLUCOSE                  124 (H)             04/13/2021                BUN                      23                  04/25/2021                CREATININE               0.70                05/12/2021                CALCIUM                  9.4                 05/05/2021                GFRNONAA                 >60                 04/19/2021           ECHO 11/22: 1. Left ventricular ejection fraction, by estimation, is 60 to 65%. The  left ventricle has normal function. The left ventricle has no regional  wall motion abnormalities. Left ventricular diastolic parameters are  indeterminate.  2. Right ventricular systolic function is normal. The right ventricular  size is normal.  3. Left atrial size was mildly dilated.  4. Right atrial size was mildly dilated.  5. The mitral valve is degenerative. Trivial mitral valve regurgitation.  No evidence of mitral stenosis. Moderate mitral annular calcification.  6. The aortic valve is tricuspid. Aortic valve regurgitation is not  visualized. Mild to moderate aortic valve sclerosis/calcification is  present, without any evidence of aortic stenosis.  7. The inferior vena cava is normal in size with greater than 50%  respiratory  variability, suggesting right atrial pressure of 3 mmHg. )        Anesthesia Quick Evaluation

## 2021-04-22 NOTE — Anesthesia Postprocedure Evaluation (Signed)
Anesthesia Post Note  Patient: Cortavious Nix  Procedure(s) Performed: [place] [facility] CHANGE     Patient location during evaluation: PACU Anesthesia Type: General Level of consciousness: awake and alert Pain management: pain level controlled Vital Signs Assessment: post-procedure vital signs reviewed and stable Respiratory status: spontaneous breathing, nonlabored ventilation, respiratory function stable and patient connected to nasal cannula oxygen Cardiovascular status: blood pressure returned to baseline and stable Postop Assessment: no apparent nausea or vomiting Anesthetic complications: no   No notable events documented.  Last Vitals:  Vitals:   04/13/2021 2020 04/21/2021 2035  BP: 121/81 (!) 116/93  Pulse: 98 (!) 116  Resp: 17 19  Temp:  37.2 C  SpO2: 95% 98%    Last Pain:  Vitals:   04/14/2021 2035  TempSrc:   PainSc: 0-No pain                 March Rummage Marzell Allemand

## 2021-04-22 NOTE — Progress Notes (Signed)
Pharmacy Antibiotic Note  [name] is a 85 y.o. male admitted on 04/24/2021 with MRSA bacteremia/sternal osteo and possible septic pulmonary emboli. He is s/p debridement of the sternum on 11/8 with plans for repeat OR today. Scr is stable at <1. Levels collected last night/today show a trough of 20 and a peak of 39 for an AUC of 695, which is supratherapeutic.   Plan: Decrease Vancomycin to 1250 mg every 24 hours  - Estimated AUC 496  Monitor renal function and levels  Will likely need a prolonged course    Height: 6' 0.01" (182.9 cm) Weight: 88.8 kg (195 lb 12.3 oz) IBW/kg (Calculated) : 77.62  Temp (24hrs), Avg:98 F (36.7 C), Min:97.7 F (36.5 C), Max:98.5 F (36.9 C)  Recent Labs  Lab 04/17/21 0856 04/17/21 0947 04/17/21 1158 04/27/2021 0539 04/19/21 0256 04/19/21 1901 04/28/2021 0249 05/12/2021 0603 04/21/21 0210 04/21/21 0623 04/23/2021 0540 04/17/2021 1240  WBC 10.8*  --   --  11.6* 10.8* 14.4* 13.0*  --   --  9.9 12.7*  --   CREATININE 1.07  --   --  0.92 0.86 0.94  --  0.88 0.90  --  0.70  --   LATICACIDVEN 2.6* 2.9* 1.6 1.6  --   --   --   --   --   --   --   --   VANCOTROUGH  --   --   --   --   --   --   --   --   --   --  20  --   VANCOPEAK  --   --   --   --   --   --   --   --   --   --   --  39     Estimated Creatinine Clearance: 76.8 mL/min (by C-G formula based on SCr of 0.7 mg/dL).    Allergies  Allergen Reactions   Codeine Nausea And Vomiting   Indocin [Indomethacin] Other (See Comments)   Latex Other (See Comments)    Blisters   Mupirocin     Blisters   Plavix [Clopidogrel Bisulfate]     GI intolerance, n/v   Polysporin [Bacitracin-Polymyxin B]     Blisters   Shellfish Allergy Nausea And Vomiting    Mussels   Tape    Neosporin [Neomycin-Bacitracin Zn-Polymyx] Rash   Other Nausea And Vomiting    mussels     Jimmy Footman, PharmD, [place], [place] Infectious Diseases Clinical Pharmacist Phone: [phone] 05/08/2021 2:29 PM

## 2021-04-22 NOTE — Progress Notes (Addendum)
Physical Therapy Treatment Patient Details Name: [name] MRN: [id number] DOB: [birth year] Today's Date: 04/24/2021   History of Present Illness Pt is an 85 yo male presenting 04/17/21 with AMS after being d/c home on 11/4 from [facility] where he was admitted for fall with LOC and SDH. Chest CT revealed large mediastinum fluid collection with gas, with a transverse fx of sternum. S/p sternal I&D and wound vac placement on 11/8; plan for repeat debridement on 11/10. S/p cortrak placement 11/9. PMH includes frequent falls, polypharmacy, neuropathy, dementia, chronic hyponatremia, HTN.   PT Comments    Pt's AMS worsened compared to prior session, including restlessness, minimal verbal responses and keeping eyes closed majority of session although pt awake. Today's session focused attempts on sitting EOB and transfer training, pt unable to progress to standing despite significant restlessness and not consistently following commands. Pt remains limited by generalized weakness, decreased activity tolerance, poor balance strategies/postural reactions and impaired cognition. Continue to recommend SNF-level therapies to maximize functional mobility and decrease caregiver burden.    Recommendations for follow up therapy are one component of a multi-disciplinary discharge planning process, led by the attending physician.  Recommendations may be updated based on patient status, additional functional criteria and insurance authorization.  Follow Up Recommendations  Skilled nursing-short term rehab (<3 hours/day)     Assistance Recommended at Discharge Frequent or constant Supervision/Assistance  Equipment Recommendations  Other (comment) (TBD)    Recommendations for Other Services       Precautions / Restrictions Precautions Precautions: Fall Precaution Comments: pt with recent SDH, frequent falls, delirium; sternal wound vac, cortrak Restrictions Weight Bearing Restrictions: No      Mobility  Bed Mobility Overal bed mobility: Needs Assistance Bed Mobility: Rolling;Supine to Sit;Sit to Supine Rolling: Total assist;+2 for physical assistance;+2 for safety/equipment   Supine to sit: Total assist;+2 for physical assistance;+2 for safety/equipment;HOB elevated Sit to supine: Total assist;+2 for physical assistance;+2 for safety/equipment   General bed mobility comments: Overall Total A x 2 to get to/from EOB. Once EOB, pt scooting L hip too close to edge, continued to do so despite repositioning efforts.  total A x 2 to roll and adjust padding. In rolling, pt reaching out for lines, therapists, etc.    Transfers                   General transfer comment: deferred due to safety, pt continued to scoot L hip to EOB despite readjustment attempts    Ambulation/Gait                   Stairs             Wheelchair Mobility    Modified Rankin (Stroke Patients Only)       Balance Overall balance assessment: Needs assistance;History of Falls Sitting-balance support: Bilateral upper extremity supported;Feet supported Sitting balance-Leahy Scale: Poor Sitting balance - Comments: reliant on UE support, Min A initially to maintain balance progressing to close min guard with static sitting Postural control: Posterior lean                                  Cognition Arousal/Alertness: Awake/alert;Lethargic Behavior During Therapy: Restless;Flat affect Overall Cognitive Status: No family/caregiver present to determine baseline cognitive functioning Area of Impairment: Orientation;Memory;Attention;Following commands;Safety/judgement;Awareness;Problem solving                 Orientation Level: Disoriented to;Place;Time;Situation Current Attention  Level: Focused Memory: Decreased recall of precautions;Decreased short-term memory Following Commands: Follows one step commands inconsistently Safety/Judgement: Decreased awareness  of safety;Decreased awareness of deficits Awareness: Intellectual Problem Solving: Slow processing;Decreased initiation;Difficulty sequencing;Requires verbal cues;Requires tactile cues General Comments: initially lethargic, kept eyes closed for majority of session, would intermittently open to command. Pt unable to consistently follow directions. Able to demo some initiation of meaningful initiation (scooting forward once assisted to EOB) but unable to demo safety awareness for OOB participation. On return to bed, pt attempting to pull lines (esp cortrak) and reaching out.        Exercises      General Comments General comments (skin integrity, edema, etc.): Noted BUE forearm edema (L>R)      Pertinent Vitals/Pain Pain Assessment: Faces Faces Pain Scale: Hurts a little bit Pain Location: generalized with movement Pain Descriptors / Indicators: Grimacing Pain Intervention(s): Monitored during session;Limited activity within patient's tolerance;Repositioned    Home Living                          Prior Function            PT Goals (current goals can now be found in the care plan section) Progress towards PT goals: Not progressing toward goals - comment (worsening AMS)    Frequency    Min 2X/week      PT Plan Current plan remains appropriate    Co-evaluation PT/OT/SLP Co-Evaluation/Treatment: Yes Reason for Co-Treatment: Necessary to address cognition/behavior during functional activity;For patient/therapist safety;To address functional/ADL transfers;Complexity of the patient's impairments (multi-system involvement) PT goals addressed during session: Mobility/safety with mobility;Balance OT goals addressed during session: ADL's and self-care      AM-PAC PT "6 Clicks" Mobility   Outcome Measure  Help needed turning from your back to your side while in a flat bed without using bedrails?: Total Help needed moving from lying on your back to sitting on the side  of a flat bed without using bedrails?: Total Help needed moving to and from a bed to a chair (including a wheelchair)?: Total Help needed standing up from a chair using your arms (e.g., wheelchair or bedside chair)?: Total Help needed to walk in hospital room?: Total Help needed climbing 3-5 steps with a railing? : Total 6 Click Score: 6    End of Session   Activity Tolerance: Patient limited by fatigue;Other (comment) (cognition) Patient left: in bed;with call bell/phone within reach;with bed alarm set;with SCD's reapplied Nurse Communication: Mobility status PT Visit Diagnosis: Other abnormalities of gait and mobility (R26.89);Repeated falls (R29.6);Muscle weakness (generalized) (M62.81);History of falling (Z91.81)     Time: 8299-3716 PT Time Calculation (min) (ACUTE ONLY): 27 min  Charges:  $Therapeutic Activity: 8-22 mins                     Mabeline Caras, PT, DPT Acute Rehabilitation Services  Pager [phone] Office [place] 04/19/2021, 11:49 AM

## 2021-04-22 NOTE — Progress Notes (Signed)
Occupational Therapy Treatment Patient Details Name: [name] MRN: [id number] DOB: 12/02/1935 Today's Date: 04/28/2021   History of present illness The pt is an 85 yo male presenting 11/5 with AMS after being d/c home on 11/4 from [facility] where he was admitted for fall with LOC and SDH. Chest CT revealed large mediastinum fluid collection with gas, with a transverse fracture of sternum. On 11/8, pt underwent I&D and wound vac placement. Cortrak placement on 11/9. PMH includes: frequent falls, polypharmacy, neuropathy, dementia, chronic hyponatremia, and HTN.   OT comments  Pt seen for first OT session since I&D and cortrak placement. Pt able to demo improved sitting balance EOB with Min A at most though required Total A x 2 to complete all bed mobility. While sitting EOB, pt with safety concerns scooting L hip forward despite repositioning efforts, so unable to safely attempt standing today. Pt remains limited by cognition with inability to follow one step commands consistently with extensive assist still required for ADLs. Pt also with restlessness, attempting to pull at lines once assisted back to bed. Soft mitts and restraints reapplied. Will continue to follow acutely to assess for progress in cognition and ADL abilities.    Recommendations for follow up therapy are one component of a multi-disciplinary discharge planning process, led by the attending physician.  Recommendations may be updated based on patient status, additional functional criteria and insurance authorization.    Follow Up Recommendations  Skilled nursing-short term rehab (<3 hours/day)    Assistance Recommended at Discharge Frequent or [facility] bed    Recommendations for Other Services      Precautions / Restrictions Precautions Precautions: Fall Precaution Comments: pt with recent SDH, frequent falls, delirium, sternal wound vac,  cortrak Restrictions Weight Bearing Restrictions: No       Mobility Bed Mobility Overal bed mobility: Needs Assistance Bed Mobility: Rolling;Supine to Sit;Sit to Supine Rolling: Total assist;+2 for physical assistance;+2 for safety/equipment   Supine to sit: Total assist;+2 for physical assistance;+2 for safety/equipment;HOB elevated Sit to supine: Total assist;+2 for physical assistance;+2 for safety/equipment   General bed mobility comments: Overall Total A x 2 to get to/from EOB. Once EOB, pt scooting L hip too close to edge, continued to do so despite repositioning efforts.  total A x 2 to roll and adjust padding. In rolling, pt reaching out for lines, therapists, etc.    Transfers                   General transfer comment: deferred due to safety, pt continued to scoot L hip to EOB despite readjustment attempts     Balance Overall balance assessment: Needs assistance;History of Falls Sitting-balance support: Bilateral upper extremity supported;Feet supported Sitting balance-Leahy Scale: Poor Sitting balance - Comments: reliant on UE support, Min A initially to maintain balance progressing to close min guard with static sitting                                   ADL either performed or assessed with clinical judgement   ADL Overall ADL's : Needs assistance/impaired     Grooming: Total assistance;Sitting;Wash/dry face Grooming Details (indicate cue type and reason): Total A to wash face sitting EOB, despite cues, assist to initiate, etc - pt unable to assist with task  General ADL Comments: Session focused on attempts to progress command following, sititng balance and OOB attempts. Improved sitting balance but cognition still a large barrier to progress    Extremity/Trunk Assessment Upper Extremity Assessment Upper Extremity Assessment: Generalized weakness;LUE deficits/detail LUE Deficits / Details: noted  edema surrounding elbow and redness. small open wound above elbow   Lower Extremity Assessment Lower Extremity Assessment: Defer to PT evaluation        Vision   Vision Assessment?: No apparent visual deficits   Perception     Praxis      Cognition Arousal/Alertness: Awake/alert;Lethargic Behavior During Therapy: Restless;Flat affect Overall Cognitive Status: No family/caregiver present to determine baseline cognitive functioning Area of Impairment: Orientation;Memory;Attention;Following commands;Safety/judgement;Awareness;Problem solving                 Orientation Level: Disoriented to;Place;Time;Situation Current Attention Level: Focused Memory: Decreased recall of precautions;Decreased short-term memory Following Commands: Follows one step commands inconsistently Safety/Judgement: Decreased awareness of safety;Decreased awareness of deficits Awareness: Intellectual Problem Solving: Slow processing;Decreased initiation;Difficulty sequencing;Requires verbal cues;Requires tactile cues General Comments: initially lethargic, kept eyes closed for majority of session. Pt unable to consistently follow directions. Able to demo some initiation of meaningful initiation (scooting forward once assisted to EOB) but unable to demo safety awareness for OOB participation. On return to bed, pt attempting to pull lines (esp cortrak) and reaching out.          Exercises     Shoulder Instructions       General Comments L forearm edema    Pertinent Vitals/ Pain       Pain Assessment: Faces Faces Pain Scale: Hurts a little bit Pain Location: generalized with movement Pain Descriptors / Indicators: Grimacing Pain Intervention(s): Limited activity within patient's tolerance;Monitored during session;Repositioned  Home Living                                          Prior Functioning/Environment              Frequency  Min 2X/week        Progress  Toward Goals  OT Goals(current goals can now be found in the care plan section)  Progress towards OT goals: OT to reassess next treatment  Acute Rehab OT Goals Patient Stated Goal: none stated, pt did call out for "[name]" - would like to see his wife OT Goal Formulation: Patient unable to participate in goal setting Time For Goal Achievement: 05/03/21 Potential to Achieve Goals: Fair ADL Goals Pt Will Perform Eating: with min assist;sitting Pt Will Perform Grooming: with min assist;sitting Pt Will Perform Upper Body Bathing: with min assist;sitting Additional ADL Goal #1: Pt to follow one step commands > 50% of the time to improve participation in functional tasks Additional ADL Goal #2: Pt to complete bed mobility at MIn A in prep for ADL transfers  Plan Discharge plan remains appropriate    Co-evaluation      Reason for Co-Treatment: Complexity of the patient's impairments (multi-system involvement);Necessary to address cognition/behavior during functional activity;For patient/therapist safety;To address functional/ADL transfers   OT goals addressed during session: ADL's and self-care      AM-PAC OT "6 Clicks" Daily Activity     Outcome Measure   Help from another person eating meals?: Total Help from another person taking care of personal grooming?: Total Help from another person toileting, which includes using toliet, bedpan, or urinal?:  Total Help from another person bathing (including washing, rinsing, drying)?: Total Help from another person to put on and taking off regular upper body clothing?: Total Help from another person to put on and taking off regular lower body clothing?: Total 6 Click Score: 6    End of Session    OT Visit Diagnosis: Unsteadiness on feet (R26.81);Other abnormalities of gait and mobility (R26.89);Muscle weakness (generalized) (M62.81);Other symptoms and signs involving cognitive function   Activity Tolerance Other (comment) (limited by  cognition)   Patient Left in bed;with call bell/phone within reach;with bed alarm set;with restraints reapplied;with SCD's reapplied;Other (comment) (soft mitts, floor mats)   Nurse Communication Mobility status;Other (comment) (RN present for much of session)        Time: [phone] OT Time Calculation (min): 28 min  Charges: OT General Charges $OT Visit: 1 Visit OT Treatments $Therapeutic Activity: 8-22 mins  Malachy Chamber, OTR/L Acute Rehab Services Office: [phone]   Layla Maw 04/25/2021, 9:17 AM

## 2021-04-22 NOTE — OR Nursing (Signed)
2 black  2 white sponge pieces in chest wound

## 2021-04-22 NOTE — Interval H&P Note (Signed)
History and Physical Interval Note: For wound VAC change under GA Lethargic but arousable  05/10/2021 6:55 PM  [name]  has presented today for surgery, with the diagnosis of STERNAL WOUND INFECTION.  The various methods of treatment have been discussed with the patient and family. After consideration of risks, benefits and other options for treatment, the patient has consented to  Procedure(s): STERNAL WOUND WASHOUT (N/A) WOUND VAC CHANGE (N/A) as a surgical intervention.  The patient's history has been reviewed, patient examined, no change in status, stable for surgery.  I have reviewed the patient's chart and labs.  Questions were answered to the patient's satisfaction.     [name]

## 2021-04-22 NOTE — Progress Notes (Addendum)
Patient HR noted sustained 140-150s- pt in Afib. Dr. Bridgett Larsson on call and alerted. Lopressor 5mg  administered with effect noted. HR 80-90s/SR.BP 138/90. No acute distress noted.   0655-HR 80's SR. Cardizem titrated to 10 mg/hr. No acute distress noted.

## 2021-04-22 NOTE — Progress Notes (Signed)
[facility] Infectious Diseases Follow Up Note  Patient Identification: Patient Name: [name] MRN: [id number] [place] Date: 05/03/2021  4:57 AM Age: 85 y.o.Today's Date: 05/09/2021   Reason for Visit: MRSA bacteremia  Principal Problem:   Sepsis due to methicillin resistant Staphylococcus aureus (MRSA) ([place]) Active Problems:   History of prostate cancer   Essential hypertension   CAD (coronary artery disease)   Hyperlipidemia, mixed   Lumbar compression fracture (HCC)   Atrial fibrillation with RVR (HCC)   Sternal wound infection   SDH (subdural hematoma)   Pneumonia involving right lung   Delirium   DNR (do not resuscitate)   Goals of care, counseling/discussion   Osteomyelitis of thoracic region [facility])   Protein-calorie malnutrition, severe   Antibiotics: Vancomycin 11/5-current                     Zosyn 11/5- 11/6  Lines/Hardwares:   Interval Events: afebrile, K 2.6, WBC 12.7. OR cultures 11/8 growing Staph aureus . Feeding tube placed for poor po intake   Assessment MRSA bacteremia: TTE with no vegetations. Repeat blood cx 11/7 no growth in 3 days  Sternal fracture with phlegmon - s/p sternal I and D on 11/8. OR cultures growing Staph aureus. Plan for repeat OR noted   Recent Falls with SDH (Left convexity subacute and Rt sided chronic) with concerns of acute infarct and hydrocephalus per NeuroSx- no surgical intervention per Neuro SX Encephalopathy/Delirium: responds to voice but mostly  non communicative   Possible Pulmonary septic emboli  Thoraco-Lumbar compression fractures vs Osteomyelitis - No surgical intervention per NeuroSX  Hypokalemia - replete per primary  Left Cephalic vein  superficial phlebitis - warm compression    Recommendations Continue Vancomycin, pharmacy to dose. Defer need of TEE to CT SX as he will need prolonged abtx ( at least 6 weeks) anyway Fu repeat blood cx 11/7 for  clearance  Fu CT surgery recs - plan for repeat OR noted  Continue GOC discussion with family  Monitor CBC, BMP and Vancomycin trough  Following  Rest of the management as per the primary team. Thank you for the consult. Please page with pertinent questions or concerns.  ______________________________________________________________________ Subjective patient seen and examined at the bedside. Sitting up in bed, responds to voice but non communicative for the most part  Vitals BP 129/87 (BP Location: Left Arm)   Pulse 97   Temp 97.7 F (36.5 C) (Axillary)   Resp 17   Ht 6' 0.01" (1.829 m)   Wt 88.8 kg   SpO2 97%   BMI 26.55 kg/m     Physical Exam Constitutional:  sitting up in bed and appears comfortable     Comments:   Cardiovascular:     Rate and Rhythm: Normal rate and regular rhythm.     Heart sounds:  Pulmonary:     Effort: Pulmonary effort is normal.     Comments:   Abdominal:     Palpations: Abdomen is soft.     Tenderness: Non tender and non distended   Musculoskeletal:        General: Left hip bruises  Skin:    Comments:   Neurological:     General: unable to assess  Psychiatric:        Mood and Affect:unable to assess  Pertinent Microbiology Results for orders placed or performed during the hospital encounter of 05/03/2021  Culture, blood (routine x 2)     Status: None (Preliminary result)   Collection  Time: 04/19/21  7:55 AM   Specimen: BLOOD LEFT WRIST  Result Value Ref Range Status   Specimen Description BLOOD LEFT WRIST  Final   Special Requests   Final    BOTTLES DRAWN AEROBIC AND ANAEROBIC Blood Culture adequate volume   Culture   Final    NO GROWTH 2 DAYS Performed at [facility], [address], [place], [place] [address]    Report Status PENDING  Incomplete  Culture, blood (routine x 2)     Status: None (Preliminary result)   Collection Time: 04/19/21  8:02 AM   Specimen: BLOOD LEFT HAND  Result Value Ref Range Status    Specimen Description BLOOD LEFT HAND  Final   Special Requests   Final    BOTTLES DRAWN AEROBIC AND ANAEROBIC Blood Culture adequate volume   Culture   Final    NO GROWTH 2 DAYS Performed at [facility], [place] [address] [place], [place] [address]    Report Status PENDING  Incomplete  MRSA Next Gen by PCR, Nasal     Status: Abnormal   Collection Time: 04/19/21 11:13 AM   Specimen: Nasal Mucosa; Nasal Swab  Result Value Ref Range Status   MRSA by PCR Next Gen DETECTED (A) NOT DETECTED Final    Comment: RESULT CALLED TO, READ BACK BY AND VERIFIED WITH: D WHITE RN [id number] 04/19/21 A BROWNING (NOTE) The GeneXpert MRSA Assay (FDA approved for NASAL specimens only), is one component of a comprehensive MRSA colonization surveillance program. It is not intended to diagnose MRSA infection nor to guide or monitor treatment for MRSA infections. Test performance is not FDA approved in patients less than 83 years old. Performed at [facility], [place] [address] [place], [place] [address]   Aerobic/Anaerobic Culture w Gram Stain (surgical/deep wound)     Status: None (Preliminary result)   Collection Time: 04/19/2021  8:23 AM   Specimen: Wound; Body Fluid  Result Value Ref Range Status   Specimen Description WOUND  Final   Special Requests  STERNAL WOUND  Final   Gram Stain   Final    FEW WBC PRESENT,BOTH PMN AND MONONUCLEAR FEW GRAM POSITIVE COCCI IN PAIRS    Culture   Final    MODERATE STAPHYLOCOCCUS AUREUS SUSCEPTIBILITIES TO FOLLOW Performed at [facility], [place] [address] [place], [place] [address]    Report Status PENDING  Incomplete  Aerobic/Anaerobic Culture w Gram Stain (surgical/deep wound)     Status: None (Preliminary result)   Collection Time: 04/14/2021  8:27 AM   Specimen: PATH Bone biopsy; Tissue  Result Value Ref Range Status   Specimen Description TISSUE  Final   Special Requests  STERNUM  Final   Gram Stain   Final    FEW WBC PRESENT,BOTH PMN AND  MONONUCLEAR FEW GRAM POSITIVE COCCI IN PAIRS    Culture   Final    MODERATE STAPHYLOCOCCUS AUREUS SUSCEPTIBILITIES TO FOLLOW Performed at [facility], [place] [address] [place], [place] [address]    Report Status PENDING  Incomplete    Pertinent Lab. CBC Latest Ref Rng & Units 04/28/2021 04/21/2021 05/12/2021  WBC 4.0 - 10.5 K/uL 12.7(H) 9.9 13.0(H)  Hemoglobin 13.0 - 17.0 g/dL 8.8(L) 7.5(L) 8.0(L)  Hematocrit 39.0 - 52.0 % 26.1(L) 22.7(L) 22.8(L)  Platelets 150 - 400 K/uL 582(H) 495(H) 420(H)   CMP Latest Ref Rng & Units 04/16/2021 04/21/2021 04/29/2021  Glucose 70 - 99 mg/dL 124(H) 141(H) -  BUN 8 - 23 mg/dL 23 22 -  Creatinine 0.61 - 1.24 mg/dL 0.70 0.90 -  Sodium 135 - 145 mmol/L 141 140 -  Potassium 3.5 - 5.1 mmol/L 2.8(L) 3.8 3.8  Chloride 98 - 111 mmol/L 108 111 -  CO2 22 - 32 mmol/L 24 18(L) -  Calcium 8.9 - 10.3 mg/dL 9.4 9.3 -  Total Protein 6.5 - 8.1 g/dL - - -  Total Bilirubin 0.3 - 1.2 mg/dL - - -  Alkaline Phos 38 - 126 U/L - - -  AST 15 - 41 U/L - - -  ALT 0 - 44 U/L - - -     Pertinent Imaging today Plain films and CT images have been personally visualized and interpreted; radiology reports have been reviewed. Decision making incorporated into the Impression / Recommendations.  DG Abd Portable 1V  Result Date: 04/21/2021 CLINICAL DATA:  Feeding tube placement EXAM: PORTABLE ABDOMEN - 1 VIEW COMPARISON:  None. FINDINGS: Feeding tube with the tip projecting over the antrum of the stomach. Gaseous distension of the small bowel and colon. No evidence of pneumoperitoneum, portal venous gas or pneumatosis. No pathologic calcifications along the expected course of the ureters. No acute osseous abnormality. IMPRESSION: Feeding tube with the tip projecting over the antrum of the stomach. Electronically Signed   By: Kathreen Devoid M.D.   On: 04/21/2021 16:29     I spent more than 40 minutes for this patient encounter including review of prior medical records,  coordination of care  with greater than 50% of time being face to face/counseling and discussing diagnostics/treatment plan with the patient/family.  Electronically signed by:   Rosiland Oz, MD Infectious Disease Physician [facility] for Infectious Disease Pager: [phone]

## 2021-04-22 NOTE — Plan of Care (Signed)

## 2021-04-23 ENCOUNTER — Encounter (HOSPITAL_COMMUNITY): Payer: Self-pay | Admitting: Thoracic Surgery (Cardiothoracic Vascular Surgery)

## 2021-04-23 DIAGNOSIS — S065XAA Traumatic subdural hemorrhage with loss of consciousness status unknown, initial encounter: Secondary | ICD-10-CM | POA: Diagnosis not present

## 2021-04-23 DIAGNOSIS — Z7189 Other specified counseling: Secondary | ICD-10-CM | POA: Diagnosis not present

## 2021-04-23 DIAGNOSIS — Z66 Do not resuscitate: Secondary | ICD-10-CM | POA: Diagnosis not present

## 2021-04-23 DIAGNOSIS — Z515 Encounter for palliative care: Secondary | ICD-10-CM | POA: Diagnosis not present

## 2021-04-23 DIAGNOSIS — S21101A Unspecified open wound of right front wall of thorax without penetration into thoracic cavity, initial encounter: Secondary | ICD-10-CM | POA: Diagnosis not present

## 2021-04-23 DIAGNOSIS — A4102 Sepsis due to Methicillin resistant Staphylococcus aureus: Secondary | ICD-10-CM | POA: Diagnosis not present

## 2021-04-23 DIAGNOSIS — R7881 Bacteremia: Secondary | ICD-10-CM | POA: Diagnosis not present

## 2021-04-23 DIAGNOSIS — M4624 Osteomyelitis of vertebra, thoracic region: Secondary | ICD-10-CM | POA: Diagnosis not present

## 2021-04-23 LAB — CBC
HCT: 25.4 % — ABNORMAL LOW (ref 39.0–52.0)
Hemoglobin: 8.5 g/dL — ABNORMAL LOW (ref 13.0–17.0)
MCH: 34.6 pg — ABNORMAL HIGH (ref 26.0–34.0)
MCHC: 33.5 g/dL (ref 30.0–36.0)
MCV: 103.3 fL — ABNORMAL HIGH (ref 80.0–100.0)
Platelets: 601 10*3/uL — ABNORMAL HIGH (ref 150–400)
RBC: 2.46 MIL/uL — ABNORMAL LOW (ref 4.22–5.81)
RDW: 15.2 % (ref 11.5–15.5)
WBC: 14.3 10*3/uL — ABNORMAL HIGH (ref 4.0–10.5)
nRBC: 0.6 % — ABNORMAL HIGH (ref 0.0–0.2)

## 2021-04-23 LAB — BASIC METABOLIC PANEL
Anion gap: 6 (ref 5–15)
BUN: 17 mg/dL (ref 8–23)
CO2: 24 mmol/L (ref 22–32)
Calcium: 8.9 mg/dL (ref 8.9–10.3)
Chloride: 108 mmol/L (ref 98–111)
Creatinine, Ser: 0.7 mg/dL (ref 0.61–1.24)
GFR, Estimated: >60 mL/min (ref >60)
Glucose, Bld: 146 mg/dL — ABNORMAL HIGH (ref 70–99)
Potassium: 3.3 mmol/L — ABNORMAL LOW (ref 3.5–5.1)
Sodium: 138 mmol/L (ref 135–145)

## 2021-04-23 LAB — PHOSPHORUS: Phosphorus: 3.7 mg/dL (ref 2.5–4.6)

## 2021-04-23 LAB — MAGNESIUM: Magnesium: 2.4 mg/dL (ref 1.7–2.4)

## 2021-04-23 LAB — GLUCOSE, CAPILLARY
Glucose-Capillary: 102 mg/dL — ABNORMAL HIGH (ref 70–99)
Glucose-Capillary: 122 mg/dL — ABNORMAL HIGH (ref 70–99)
Glucose-Capillary: 132 mg/dL — ABNORMAL HIGH (ref 70–99)
Glucose-Capillary: 133 mg/dL — ABNORMAL HIGH (ref 70–99)
Glucose-Capillary: 139 mg/dL — ABNORMAL HIGH (ref 70–99)
Glucose-Capillary: 142 mg/dL — ABNORMAL HIGH (ref 70–99)
Glucose-Capillary: 155 mg/dL — ABNORMAL HIGH (ref 70–99)

## 2021-04-23 MED ORDER — PROPOFOL 10 MG/ML IV BOLUS
INTRAVENOUS | Status: AC
  Filled 2021-04-23: qty 20

## 2021-04-23 MED ORDER — POTASSIUM CHLORIDE 10 MEQ/100ML IV SOLN
10.0000 meq | INTRAVENOUS | Status: AC
  Administered 2021-04-23 (×4): 10 meq via INTRAVENOUS
  Filled 2021-04-23 (×4): qty 100

## 2021-04-23 NOTE — Progress Notes (Addendum)
      [address]       [place] [address]             [phone]      Post-op day 3   STERNAL WOUND DEBRIDEMENT Post-op day 1 WOUND VAC CHANGE (N/A)  Subjective: Sleeping but awakened easily.  Denies pain.   Objective: Vital signs in last 24 hours: Temp:  [97.4 F (36.3 C)-99 F (37.2 C)] 98 F (36.7 C) (11/11 1200) Pulse Rate:  [49-116] 89 (11/11 1200) Cardiac Rhythm: Normal sinus rhythm (11/11 0702) Resp:  [15-22] 18 (11/11 1200) BP: (116-157)/(73-98) 131/73 (11/11 1200) SpO2:  [95 %-100 %] 100 % (11/11 1200) Weight:  [88.7 kg] 88.7 kg (11/11 0331)     Intake/Output from previous day: 11/10 0701 - 11/11 0700 In: 300 [I.V.:300] Out: 3000 [Urine:2950; Drains:50] Intake/Output this shift: No intake/output data recorded.  General appearance: cooperative and no distress Heart: irregular rhythm Wound: Wound vac in place to sternal wound and functioning appropriately. 67ml serosanguinous drainage since placement yesterday  Lab Results: Recent Labs    04/14/2021 0540 04/23/21 0614  WBC 12.7* 14.3*  HGB 8.8* 8.5*  HCT 26.1* 25.4*  PLT 582* 601*    BMET:  Recent Labs    04/29/2021 0540 04/23/21 0614  NA 141 138  K 2.8* 3.3*  CL 108 108  CO2 24 24  GLUCOSE 124* 146*  BUN 23 17  CREATININE 0.70 0.70  CALCIUM 9.4 8.9     PT/INR:  No results for input(s): LABPROT, INR in the last 72 hours.  ABG    Component Value Date/Time   PHART 7.443 04/19/2021 1854   HCO3 19.9 (L) 04/19/2021 1854   ACIDBASEDEF 3.4 (H) 04/19/2021 1854   O2SAT 95.6 04/19/2021 1854   CBG (last 3)  Recent Labs    04/23/21 0410 04/23/21 0828 04/23/21 1252  GLUCAP 133* 132* 139*    Assessment/Plan: S/P Procedure(s) (LRB): STERNAL WOUND WASHOUT (N/A) WOUND VAC CHANGE (N/A)  -POD3 incision and excisional debridement of chest wall abscess and placement of wound vac.  -POD1 debridement and vac change in the OR Wound Cx from OR on 11/8 --> MRSA On Vanc for  previously cultured MRSA (from blood 11/5). Continue negative pressure therapy. Will plan for nextvac change at the bedside early next week.      LOS: 5 days    Joline Maxcy [phone] 04/23/2021  Patient seen and examined. Lethargic, answers questions with brief statements, some confusion persists.  Note plan for TEE. OK for documentation/ prognosis, but won't change therapeutic plan as he will need long term antibiotics regardless and he is NOT a candidate for surgery.  Revonda Standard Roxan Hockey, MD [facility] [phone]

## 2021-04-23 NOTE — Progress Notes (Addendum)
[facility] Infectious Diseases Follow Up Note  Patient Identification: Patient Name: [name] MRN: [id number] [place] Date: 04/25/2021  4:57 AM Age: 85 y.o.Today's Date: 04/23/2021   Reason for Visit: MRSA bacteremia  Principal Problem:   Sepsis due to methicillin resistant Staphylococcus aureus (MRSA) ([place]) Active Problems:   History of prostate cancer   Essential hypertension   CAD (coronary artery disease)   Hyperlipidemia, mixed   Lumbar compression fracture (HCC)   Atrial fibrillation with RVR (HCC)   Sternal wound infection   SDH (subdural hematoma)   Pneumonia involving right lung   Delirium   DNR (do not resuscitate)   Goals of care, counseling/discussion   Osteomyelitis of thoracic region ([place])   Protein-calorie malnutrition, severe   Antibiotics: Vancomycin 11/5-current                     Zosyn 11/5- 11/6  Lines/Hardwares:   Interval Events: afebrile,    Assessment MRSA bacteremia: TTE with no vegetations. Repeat blood cx 11/7 no growth  Sternal fracture with phlegmon/Osteomyelitis  -     s/p sternal I and D on 11/8. OR cultures growing MRSA -     s/p repeat excisional debridement and wound vac change on 11/20  Recent Falls with SDH (Left convexity subacute and Rt sided chronic) with concerns of acute infarct and hydrocephalus per NeuroSx- no surgical intervention per Neuro SX Encephalopathy/Delirium: does not respond to voice or follows commands Possible Pulmonary septic emboli  Thoraco-Lumbar compression fractures vs Osteomyelitis - No surgical intervention per NeuroSX  Hypokalemia - replete per primary  Left Cephalic vein  superficial phlebitis - warm compression    Recommendations Continue Vancomycin, pharmacy to dose.  Although I deferred getting a TEE to CT Surgery initially,  I am leaning towards getting a TEE now given his mental status has not improved much and also concerns for acute  infarct by Neuro sx on note from 11/6 if aggressive measures are desired by family.  Consider repeat imaging of his head  Fu repeat blood cx 11/7 for clearance  Fu OR cx 11/8 to completion Fu GOC discussion with family  Monitor CBC, BMP and Vancomycin trough  Discussed wih Dr  [place] Pink  Dr Gale Journey covering this weekend. Otherwise, I will follow up on Monday  Rest of the management as per the primary team. Thank you for the consult. Please page with pertinent questions or concerns.  ______________________________________________________________________ Subjective patient seen and examined at the bedside. He has his eyes closed and does not responds to voice and follows commands  BP 127/84   Pulse 85   Temp 97.9 F (36.6 C) (Axillary)   Resp 15   Ht 6' 0.01" (1.829 m)   Wt 88.7 kg   SpO2 97%   BMI 26.52 kg/m     Physical Exam Constitutional:  sitting up in bed with closed eyes, does not respond to voice     Comments: Dub hoff in place  Cardiovascular:     Rate and Rhythm: Normal rate and regular rhythm.     Heart sounds:  Pulmonary:     Effort: Pulmonary effort is normal.     Comments:   Abdominal:     Palpations: Abdomen is soft.     Tenderness: Non tender and non distended   Musculoskeletal:        General: Left hip bruises  Skin:    Comments:   Neurological:     General: unable to assess  Psychiatric:  Mood and Affect:unable to assess  Pertinent Microbiology Results for orders placed or performed during the hospital encounter of 04/27/2021  Culture, blood (routine x 2)     Status: None (Preliminary result)   Collection Time: 04/19/21  7:55 AM   Specimen: BLOOD LEFT WRIST  Result Value Ref Range Status   Specimen Description BLOOD LEFT WRIST  Final   Special Requests   Final    BOTTLES DRAWN AEROBIC AND ANAEROBIC Blood Culture adequate volume   Culture   Final    NO GROWTH 3 DAYS Performed at [facility], [place] [address] [place], [place]  [address]    Report Status PENDING  Incomplete  Culture, blood (routine x 2)     Status: None (Preliminary result)   Collection Time: 04/19/21  8:02 AM   Specimen: BLOOD LEFT HAND  Result Value Ref Range Status   Specimen Description BLOOD LEFT HAND  Final   Special Requests   Final    BOTTLES DRAWN AEROBIC AND ANAEROBIC Blood Culture adequate volume   Culture   Final    NO GROWTH 3 DAYS Performed at [facility], [place] [address] [place], [place] [address]    Report Status PENDING  Incomplete  MRSA Next Gen by PCR, Nasal     Status: Abnormal   Collection Time: 04/19/21 11:13 AM   Specimen: Nasal Mucosa; Nasal Swab  Result Value Ref Range Status   MRSA by PCR Next Gen DETECTED (A) NOT DETECTED Final    Comment: RESULT CALLED TO, READ BACK BY AND VERIFIED WITH: D WHITE RN 1503 04/19/21 A BROWNING (NOTE) The GeneXpert MRSA Assay (FDA approved for NASAL specimens only), is one component of a comprehensive MRSA colonization surveillance program. It is not intended to diagnose MRSA infection nor to guide or monitor treatment for MRSA infections. Test performance is not FDA approved in patients less than 4 years old. Performed at [facility], [place] [address], [place], [place] [address]   Aerobic/Anaerobic Culture w Gram Stain (surgical/deep wound)     Status: None (Preliminary result)   Collection Time: 04/29/2021  8:23 AM   Specimen: Wound; Body Fluid  Result Value Ref Range Status   Specimen Description WOUND  Final   Special Requests  STERNAL WOUND  Final   Gram Stain   Final    FEW WBC PRESENT,BOTH PMN AND MONONUCLEAR FEW GRAM POSITIVE COCCI IN PAIRS Performed at [facility], [address], [place],  [address]    Culture   Final    MODERATE METHICILLIN RESISTANT STAPHYLOCOCCUS AUREUS NO ANAEROBES ISOLATED; CULTURE IN PROGRESS FOR 5 DAYS    Report Status PENDING  Incomplete   Organism ID, Bacteria METHICILLIN RESISTANT STAPHYLOCOCCUS AUREUS  Final       Susceptibility   Methicillin resistant staphylococcus aureus - MIC*    CIPROFLOXACIN >=8 RESISTANT Resistant     ERYTHROMYCIN <=0.25 SENSITIVE Sensitive     GENTAMICIN <=0.5 SENSITIVE Sensitive     OXACILLIN >=4 RESISTANT Resistant     TETRACYCLINE <=1 SENSITIVE Sensitive     VANCOMYCIN 1 SENSITIVE Sensitive     TRIMETH/SULFA <=10 SENSITIVE Sensitive     CLINDAMYCIN <=0.25 SENSITIVE Sensitive     RIFAMPIN <=0.5 SENSITIVE Sensitive     Inducible Clindamycin NEGATIVE Sensitive     * MODERATE METHICILLIN RESISTANT STAPHYLOCOCCUS AUREUS  Aerobic/Anaerobic Culture w Gram Stain (surgical/deep wound)     Status: None (Preliminary result)   Collection Time: 05/04/2021  8:27  AM   Specimen: PATH Bone biopsy; Tissue  Result Value Ref Range Status   Specimen Description TISSUE  Final   Special Requests  STERNUM  Final   Gram Stain   Final    FEW WBC PRESENT,BOTH PMN AND MONONUCLEAR FEW GRAM POSITIVE COCCI IN PAIRS Performed at [facility], [address] [place], [place] [address]    Culture   Final    MODERATE METHICILLIN RESISTANT STAPHYLOCOCCUS AUREUS NO ANAEROBES ISOLATED; CULTURE IN PROGRESS FOR 5 DAYS    Report Status PENDING  Incomplete   Organism ID, Bacteria METHICILLIN RESISTANT STAPHYLOCOCCUS AUREUS  Final      Susceptibility   Methicillin resistant staphylococcus aureus - MIC*    CIPROFLOXACIN >=8 RESISTANT Resistant     ERYTHROMYCIN <=0.25 SENSITIVE Sensitive     GENTAMICIN <=0.5 SENSITIVE Sensitive     OXACILLIN >=4 RESISTANT Resistant     TETRACYCLINE <=1 SENSITIVE Sensitive     VANCOMYCIN 1 SENSITIVE Sensitive     TRIMETH/SULFA <=10 SENSITIVE Sensitive     CLINDAMYCIN <=0.25 SENSITIVE Sensitive     RIFAMPIN <=0.5 SENSITIVE Sensitive     Inducible Clindamycin NEGATIVE Sensitive     * MODERATE METHICILLIN RESISTANT STAPHYLOCOCCUS AUREUS    Pertinent Lab. CBC Latest Ref Rng & Units 04/23/2021 05/11/2021 04/21/2021  WBC 4.0 - 10.5 K/uL 14.3(H) 12.7(H) 9.9   Hemoglobin 13.0 - 17.0 g/dL 8.5(L) 8.8(L) 7.5(L)  Hematocrit 39.0 - 52.0 % 25.4(L) 26.1(L) 22.7(L)  Platelets 150 - 400 K/uL 601(H) 582(H) 495(H)   CMP Latest Ref Rng & Units 04/23/2021 05/09/2021 04/21/2021  Glucose 70 - 99 mg/dL 146(H) 124(H) 141(H)  BUN 8 - 23 mg/dL 17 23 22   Creatinine 0.61 - 1.24 mg/dL 0.70 0.70 0.90  Sodium 135 - 145 mmol/L 138 141 140  Potassium 3.5 - 5.1 mmol/L 3.3(L) 2.8(L) 3.8  Chloride 98 - 111 mmol/L 108 108 111  CO2 22 - 32 mmol/L 24 24 18(L)  Calcium 8.9 - 10.3 mg/dL 8.9 9.4 9.3  Total Protein 6.5 - 8.1 g/dL - - -  Total Bilirubin 0.3 - 1.2 mg/dL - - -  Alkaline Phos 38 - 126 U/L - - -  AST 15 - 41 U/L - - -  ALT 0 - 44 U/L - - -     Pertinent Imaging today Plain films and CT images have been personally visualized and interpreted; radiology reports have been reviewed. Decision making incorporated into the Impression / Recommendations.  I spent more than 40 minutes for this patient encounter including review of prior medical records, coordination of care  with greater than 50% of time being face to face/counseling and discussing diagnostics/treatment plan with the patient/family.  Electronically signed by:   Rosiland Oz, MD Infectious Disease Physician [facility] for Infectious Disease Pager: [phone]

## 2021-04-23 NOTE — Evaluation (Addendum)
Clinical/Bedside Swallow Evaluation Patient Details  Name: [name] MRN: [id number] Date of Birth: 1935-12-23  Today's Date: 04/23/2021 Time: SLP Start Time (ACUTE ONLY): 1100 SLP Stop Time (ACUTE ONLY): 7654 SLP Time Calculation (min) (ACUTE ONLY): 9 min  Past Medical History:  Past Medical History:  Diagnosis Date   Arthritis    Benign essential tremor    Fracture, femur ([place]) 2012   GERD (gastroesophageal reflux disease)    H/O echocardiogram    05/2013   Hx of colonoscopy    04/22/2013   Hypercholesterolemia    Hypertension    Myocardial infarction [facility])    1987   Polyneuropathy    Prostate cancer ([place]) 2004   Superficial hematoma    Past Surgical History:  Past Surgical History:  Procedure Laterality Date   APPLICATION OF WOUND VAC Left 08/12/2020   Procedure: APPLICATION OF WOUND VAC;  Surgeon: Samara Deist, DPM;  Location: [facility];  Service: Podiatry;  Laterality: Left;   APPLICATION OF WOUND VAC N/A 04/17/2021   Procedure: APPLICATION OF WOUND VAC;  Surgeon: Melrose Nakayama, MD;  Location: [place];  Service: Thoracic;  Laterality: N/A;   APPLICATION OF WOUND VAC N/A 05/05/2021   Procedure: WOUND VAC CHANGE;  Surgeon: Melrose Nakayama, MD;  Location: [place];  Service: Thoracic;  Laterality: N/A;   BASAL CELL CARCINOMA EXCISION     COLONOSCOPY  05/01/2009   Positive for colonic polyps   COLONOSCOPY WITH PROPOFOL N/A 02/24/2016   Procedure: COLONOSCOPY WITH PROPOFOL;  Surgeon: Christene Lye, MD;  Location: [facility];  Service: Endoscopy;  Laterality: N/A;   CORONARY ARTERY BYPASS GRAFT  1987   X2   HERNIA REPAIR  1990   KYPHOPLASTY N/A 02/19/2021   Procedure: KYPHOPLASTY-L2&L3;  Surgeon: Hessie Knows, MD;  Location: [facility];  Service: Orthopedics;  Laterality: N/A;   PROSTATE SURGERY  2004   STERNAL WOUND DEBRIDEMENT N/A 04/16/2021   Procedure: STERNAL WOUND IRRIGATION & DEBRIDEMENT;  Surgeon: Melrose Nakayama, MD;  Location: [place];   Service: Thoracic;  Laterality: N/A;   STERNAL WOUND DEBRIDEMENT N/A 04/17/2021   Procedure: STERNAL WOUND WASHOUT;  Surgeon: Melrose Nakayama, MD;  Location: [place];  Service: Thoracic;  Laterality: N/A;   TEE WITHOUT CARDIOVERSION N/A 08/13/2020   Procedure: TRANSESOPHAGEAL ECHOCARDIOGRAM (TEE);  Surgeon: Corey Skains, MD;  Location: [facility];  Service: Cardiovascular;  Laterality: N/A;   WOUND DEBRIDEMENT Left 08/12/2020   Procedure: DEBRIDEMENT WOUND-Left Heel;  Surgeon: Samara Deist, DPM;  Location: [facility];  Service: Podiatry;  Laterality: Left;   HPI:  Pt is an 85 yo male who presented 11/5 with AMS after being d/c home on 11/4 from [facility] where he was admitted for fall with LOC and SDH. Chest CT revealed large mediastinum fluid collection with gas, with a transverse fx of sternum. Pt s/p sternal I&D and wound vac placement on 11/8 and repeat debridement on 11/10. Cortrak placed 11/9. PMH: frequent falls, polypharmacy, neuropathy, dementia, chronic hyponatremia, HTN.    Assessment / Plan / Recommendation  Clinical Impression  Pt was seen for bedside swallow evaluation. He was lethargic and vocalized in response to stimulation, but did not communicate verbally. A complete oral mechanism exam could not be conducted due to pt's difficulty following commands. Oral inspection revealed a dry oral mucosa and pt was resistant to oral care. Adequate dentition was noted. Pt demonstrated impaired bolus awareness; no bolus manipulation was noted and anterior spillage was therefore demonstrated or boluses  were removed via oral suction. No volitional swallow could be elicited. Pt does not present as a candidate for safe oral intake and this time; it is recommended that his NPO status be maintained and that enteral nutrition be continued. SLP will follow to assess improvement in swallow function. SLP Visit Diagnosis: Dysphagia, unspecified (R13.10)    Aspiration Risk  Severe aspiration  risk;Risk for inadequate nutrition/hydration    Diet Recommendation NPO   Medication Administration: Via alternative means    Other  Recommendations Oral Care Recommendations: Oral care QID    Recommendations for follow up therapy are one component of a multi-disciplinary discharge planning process, led by the attending physician.  Recommendations may be updated based on patient status, additional functional criteria and insurance authorization.  Follow up Recommendations Skilled nursing-short term rehab (<3 hours/day)      Assistance Recommended at Discharge Frequent or constant Supervision/Assistance  Functional Status Assessment    Frequency and Duration min 2x/week  2 weeks       Prognosis Prognosis for Safe Diet Advancement: Fair Barriers to Reach Goals: Cognitive deficits      Swallow Study   General Date of Onset: 04/19/2021 HPI: Pt is an 85 yo male who presented 11/5 with AMS after being d/c home on 11/4 from [place] where he was admitted for fall with LOC and SDH. Chest CT revealed large mediastinum fluid collection with gas, with a transverse fx of sternum. Pt s/p sternal I&D and wound vac placement on 11/8 and repeat debridement on 11/10. Cortrak placed 11/9. PMH: frequent falls, polypharmacy, neuropathy, dementia, chronic hyponatremia, HTN. Type of Study: Bedside Swallow Evaluation Previous Swallow Assessment: none Diet Prior to this Study: NPO;NG Tube Temperature Spikes Noted: No Respiratory Status: Nasal cannula History of Recent Intubation: No Behavior/Cognition: Alert;Cooperative;Pleasant mood Oral Cavity Assessment: Within Functional Limits Oral Care Completed by SLP: Recent completion by staff Oral Cavity - Dentition: Adequate natural dentition Self-Feeding Abilities: Total assist Patient Positioning: Upright in bed;Postural control adequate for testing Baseline Vocal Quality: Not observed (Pt vocalized, but no verbalization noted) Volitional Cough:  Cognitively unable to elicit Volitional Swallow: Unable to elicit    Oral/Motor/Sensory Function Overall Oral Motor/Sensory Function:  (UTA)   Ice Chips Ice chips: Impaired Presentation: Spoon Oral Phase Impairments: Poor awareness of bolus Oral Phase Functional Implications: Left anterior spillage;Right anterior spillage   Thin Liquid Thin Liquid: Impaired Presentation: Spoon Oral Phase Impairments: Poor awareness of bolus Oral Phase Functional Implications: Left anterior spillage    Nectar Thick Nectar Thick Liquid: Not tested   Honey Thick Honey Thick Liquid: Not tested   Puree Puree: Impaired Presentation: Spoon Oral Phase Impairments: Poor awareness of bolus   Solid     Solid: Not tested     [name] I. Hardin Negus, [place], [place] Office number [phone] Pager [place] 04/23/2021,11:26 AM

## 2021-04-23 NOTE — Progress Notes (Signed)
Pt is scheduled for TEE on Monday at 10:30. Case reviewed with Dr. Harrington Challenger, reader B on Monday. She will review on Monday and make a decision regarding TEE.   I have spoken with RN and she will make NPO/turn off feeds at midnight Sunday night.    Ledora Bottcher, PA-C 04/23/2021, 11:06 AM [place] [address] [place], [place] [address]

## 2021-04-23 NOTE — Progress Notes (Signed)
PROGRESS NOTE    [name]   PJK:[id number]  DOB: 09/29/1935  PCP: [name], DO    DOA: 04/28/2021 LOS: 5    Brief Narrative / Hospital Course to Date:   85 y.o. male with medical history significant of HTN; HLD; remote prostate CA; CAD s/p remote CABG; and abdominal wall melanoma presenting to [facility] on 04/17/21 with sepsis, presenting with fever, encephalopathy, respiratory distress and A-fib RVR.  Transferred to [facility] on 04/21/2021 for CT surgery coverage due to a mediastinal fluid collection associated with transverse sternal fracture.  Patient found to have Staph bacteremia with findings of pulmonary septic emboli on CT scan.  Infectious disease, CT surgery, neurosurgery, palliative care consulted.   Assessment & Plan    MRSA bacteremia with severe sepsis, present on admission Sepsis POA with leukocytosis, tachycardia, tachypnea, hypoxia.  Evidence of acute organ failure with elevated lactate >2 that is not easily explained by another condition. Blood cultures were positive for MRSA. Septic emboli appreciated on imaging. Suspected source is unknown - PNA, spinal osteo, recent skin surgery are considerations (see below) Infectious disease was consulted.  Patient remains on vancomycin. Transthoracic echocardiogram showed normal systolic function.  No evidence for vegetation was noted. Remains afebrile.  WBC noted to be elevated at 14.3.  Continue current management for now. Patient remains afebrile.  Continued on vancomycin.  Infectious disease is considering TEE but patient appears to be gradually declining.  Will discuss with his wife today.  Anterior mediastinal abscess/phlegmon  Mediastinal fluid collection with gas/fluid with transverse sternal fracture.  Cardiothoracic surgery was consulted. Patient taken for I&D on 11/8 and wound VAC placed. Underwent a wound VAC change in the OR on 11/10.   Subdural Hematoma (SDH) Patient has had recent SDH x 2,  acute on chronic, seen at [facility].  This was secondary to multiple falls. Also with compression fractures, ?osteomyelitis Anticoagulation and ASA are contraindicated at this time.   Right midlung PNA vs Septic Emboli  Appears to have septic emboli on imaging. Treated with Vanc/Zosyn on admission. Zosyn d/c'd per ID Continue Vancomycin Respiratory status stable for the most part.  Hypokalemia  Potassium will be repleted.  Magnesium is 2.4.   New onset A-fib - POA, Likely triggered by infection. Anticoagulation contraindicated in setting of SDH. Remains on Cardizem infusion.  Patient had episode of tachycardia overnight which got controlled after he was given metoprolol.  Leave him on intravenous Cardizem for now.    Oropharyngeal dysphagia Patient's mentation continues to worsen.  He remains at very high aspiration risk.  Subsequently started on tube feedings.  Nutrition Due to extremely poor oral intake cortrak feeding tube was placed.  Thoracolumbar compression fractures with ? Osteomyelitis  No surgical intervention planned.  Seen by neurosurgery.  On antibiotics per ID.   Abdominal wall melanoma Has steri-strips in place, no obvious infection.  Path reports from excision reflect negative margins. Monitor.   Left AC superficial thrombophlebitis  Conservative management.  No DVT is noted on imaging studies.  Macrocytic anemia Drop in hemoglobin likely dilutional.  Hemoglobin is stable for the most part.  No overt bleeding noted.   Anemia panel reviewed.  No clear-cut deficiencies identified.  TSH is noted to be 1.2 with a normal free T4.   Essential hypertension  Norvasc and Cozaar on hold. Currently on Cardizem infusion for A. fib as mentioned above.   Hyperlipidemia Hold pravastatin for now with AMS, resume when safe to take PO  Remote Prostate CA  Bladder wall thickening noted on imaging.  Monitor for now.  Outpatient follow-up with urology   Coronary artery disease s/p  remote CABG   Delirium Most likely due to infection, unclear if recent traumatic SDH contributing.  Hold sedating meds: Primidone, benadryl (consider d/c), Neurontin  Goals of care Palliative care has been consulted since patient's prognosis appears to be poor.  Discussed with patient's wife.  Patient has a son who lives out of town.  Patient's wife was told about guarded to poor prognosis especially if he does not start eating and drinking on his own.  She understands.  Patient has not shown much improvement in the last 2 days.  His mentation appears to have worsened.  He has not noted to be on any sedative medications.  Prognosis appears to be poor.  Will discuss with his wife again today.  May need to consider transition to comfort care if there is no improvement in the next 24 to 48 hours   DVT prophylaxis: SCDs CODE STATUS: DNR Family communication: No family at bedside Disposition: To be determined  Status is: Inpatient  Remains inpatient appropriate because: Need for IV antibiotics and further surgical debridement       Subjective   Noted to be lethargic today.  Does not even open his eyes today.    Consults, Procedures, Significant Events   Consultants:  Infectious disease Palliative care Cardiothoracic surgery Neurosurgery  Procedures:  Irrigation and drainage of the mediastinal abscess.  Antimicrobials:  Anti-infectives (From admission, onward)    Start     Dose/Rate Route Frequency Ordered Stop   04/23/21 0830  vancomycin (VANCOREADY) IVPB 1250 mg/250 mL        1,250 mg 166.7 mL/hr over 90 Minutes Intravenous Every 24 hours 05/04/2021 1433     04/19/21 0830  vancomycin (VANCOREADY) IVPB 1750 mg/350 mL  Status:  Discontinued        1,750 mg 175 mL/hr over 120 Minutes Intravenous Every 24 hours 04/19/21 0742 05/09/2021 1433   05/04/2021 2000  vancomycin (VANCOREADY) IVPB 1750 mg/350 mL  Status:  Discontinued        1,750 mg 175 mL/hr over 120 Minutes Intravenous  Every 24 hours 04/30/2021 1007 04/19/2021 1706   05/05/2021 1400  piperacillin-tazobactam (ZOSYN) IVPB 3.375 g  Status:  Discontinued        3.375 g 12.5 mL/hr over 240 Minutes Intravenous Every 8 hours 04/29/2021 1007 04/19/21 0742         Micro    Objective   Vitals:   04/23/21 0000 04/23/21 0331 04/23/21 0400 04/23/21 0800  BP: 138/90 124/73 127/84 133/76  Pulse: (!) 49 84 85 88  Resp:  15  16  Temp:  97.9 F (36.6 C)  (!) 97.4 F (36.3 C)  TempSrc:  Axillary  Oral  SpO2: 98% 98% 97% 100%  Weight:  88.7 kg    Height:        Intake/Output Summary (Last 24 hours) at 04/23/2021 1010 Last data filed at 04/23/2021 0600 Gross per 24 hour  Intake 300 ml  Output 3000 ml  Net -2700 ml    Filed Weights   04/15/2021 2320 04/21/2021 0408 04/23/21 0331  Weight: 89.4 kg 88.8 kg 88.7 kg    Physical Exam:  General appearance: Lethargic.  Not easily arousable.  In no distress NG tube is noted Resp: Coarse breath sound bilaterally.  Few crackles at the bases.  No wheezing or rhonchi. Cardio: S1-S2 is irregularly irregular GI: Abdomen is soft.  Nontender nondistended.  Bowel sounds are present normal.  No masses organomegaly Extremities: No edema.  Full range of motion of lower extremities. Neurologic: Not very responsive today.  No focal neurological deficits.      Labs   Data Reviewed: I have personally reviewed following labs and imaging studies  CBC: Recent Labs  Lab 04/17/21 0856 04/16/2021 0539 04/19/21 0256 04/19/21 1901 04/16/2021 0249 04/21/21 0623 05/09/2021 0540 04/23/21 0614  WBC 10.8* 11.6*   < > 14.4* 13.0* 9.9 12.7* 14.3*  NEUTROABS 8.9* 10.2*  --   --   --   --   --   --   HGB 9.9* 8.9*   < > 8.2* 8.0* 7.5* 8.8* 8.5*  HCT 29.3* 27.3*   < > 24.0* 22.8* 22.7* 26.1* 25.4*  MCV 101.4* 106.6*   < > 99.2 98.7 101.8* 102.4* 103.3*  PLT 303 296   < > 447* 420* 495* 582* 601*   < > = values in this interval not displayed.    Basic Metabolic Panel: Recent Labs  Lab  04/19/21 1901 04/19/2021 0603 04/28/2021 1439 04/21/21 0210 04/21/21 1602 04/24/2021 0540 05/11/2021 1613 04/23/21 0614  NA 138 139  --  140  --  141  --  138  K 2.7* 2.8* 3.8 3.8  --  2.8*  --  3.3*  CL 106 109  --  111  --  108  --  108  CO2 20* 20*  --  18*  --  24  --  24  GLUCOSE 141* 128*  --  141*  --  124*  --  146*  BUN 19 19  --  22  --  23  --  17  CREATININE 0.94 0.88  --  0.90  --  0.70  --  0.70  CALCIUM 9.6 9.7  --  9.3  --  9.4  --  8.9  MG  --  1.8  --  1.8 1.8 1.8 1.6* 2.4  PHOS  --   --   --   --  3.2 3.4 3.5 3.7    GFR: Estimated Creatinine Clearance: 76.8 mL/min (by C-G formula based on SCr of 0.7 mg/dL). Liver Function Tests: Recent Labs  Lab 04/17/21 0856 04/14/2021 0539 04/19/21 1901  AST 30 43* 182*  ALT 20 24 94*  ALKPHOS 63 54 107  BILITOT 1.1 1.3* 2.0*  PROT 6.8 6.0* 5.9*  ALBUMIN 2.7* 2.0* 1.8*     Coagulation Profile: Recent Labs  Lab 04/17/21 0856 04/19/21 1901  INR 1.2 1.9*    CBG: Recent Labs  Lab 04/25/2021 1624 04/21/2021 2054 04/23/21 0002 04/23/21 0410 04/23/21 0828  GLUCAP 122* 124* 142* 133* 132*    Sepsis Labs: Recent Labs  Lab 04/17/21 0856 04/17/21 0947 04/17/21 1158 04/30/2021 0539 04/24/2021 0737 04/13/2021 0603 04/21/21 0210  PROCALCITON  --   --   --   --  3.69 1.36 0.77  LATICACIDVEN 2.6* 2.9* 1.6 1.6  --   --   --      Recent Results (from the past 240 hour(s))  Resp Panel by RT-PCR (Flu A&B, Covid) Nasopharyngeal Swab     Status: None   Collection Time: 04/17/21  8:56 AM   Specimen: Nasopharyngeal Swab; Nasopharyngeal(NP) swabs in vial transport medium  Result Value Ref Range Status   SARS Coronavirus 2 by RT PCR NEGATIVE NEGATIVE Final    Comment: (NOTE) SARS-CoV-2 target nucleic acids are NOT DETECTED.  The SARS-CoV-2 RNA is generally detectable in upper  respiratory specimens during the acute phase of infection. The lowest concentration of SARS-CoV-2 viral copies this assay can detect is 138 copies/mL.  A negative result does not preclude SARS-Cov-2 infection and should not be used as the sole basis for treatment or other patient management decisions. A negative result may occur with  improper specimen collection/handling, submission of specimen other than nasopharyngeal swab, presence of viral mutation(s) within the areas targeted by this assay, and inadequate number of viral copies(<138 copies/mL). A negative result must be combined with clinical observations, patient history, and epidemiological information. The expected result is Negative.  Fact Sheet for Patients:  EntrepreneurPulse.com.au  Fact Sheet for Healthcare Providers:  IncredibleEmployment.be  This test is no t yet approved or cleared by the [place] FDA and  has been authorized for detection and/or diagnosis of SARS-CoV-2 by FDA under an Emergency Use Authorization (EUA). This EUA will remain  in effect (meaning this test can be used) for the duration of the COVID-19 declaration under Section 564(b)(1) of the Act, 21 U.S.C.section 360bbb-3(b)(1), unless the authorization is terminated  or revoked sooner.       Influenza A by PCR NEGATIVE NEGATIVE Final   Influenza B by PCR NEGATIVE NEGATIVE Final    Comment: (NOTE) The Xpert Xpress SARS-CoV-2/FLU/RSV plus assay is intended as an aid in the diagnosis of influenza from Nasopharyngeal swab specimens and should not be used as a sole basis for treatment. Nasal washings and aspirates are unacceptable for Xpert Xpress SARS-CoV-2/FLU/RSV testing.  Fact Sheet for Patients: EntrepreneurPulse.com.au  Fact Sheet for Healthcare Providers: IncredibleEmployment.be  This test is not yet approved or cleared by the [place] FDA and has been authorized for detection and/or diagnosis of SARS-CoV-2 by FDA under an Emergency Use Authorization (EUA). This EUA will remain in effect (meaning this test  can be used) for the duration of the COVID-19 declaration under Section 564(b)(1) of the Act, 21 U.S.C. section 360bbb-3(b)(1), unless the authorization is terminated or revoked.  Performed at [facility], [address] [place], [place] [address]   Culture, blood (Routine x 2)     Status: Abnormal   Collection Time: 04/17/21  9:20 AM   Specimen: BLOOD  Result Value Ref Range Status   Specimen Description   Final    BLOOD RIGHT FA Performed at [facility], [address] [place], [place] [address]    Special Requests   Final    BOTTLES DRAWN AEROBIC AND ANAEROBIC Blood Culture adequate volume Performed at [facility], [address] [place], [place] [address]    Culture  Setup Time   Final    GRAM POSITIVE COCCI IN BOTH AEROBIC AND ANAEROBIC BOTTLES Organism ID to follow CRITICAL RESULT CALLED TO, READ BACK BY AND VERIFIED WITH: SAM RAUER 2203 04/17/21 LFD Performed at [facility], [address] [place], [place] [address]    Culture (A)  Final    STAPHYLOCOCCUS AUREUS SUSCEPTIBILITIES PERFORMED ON PREVIOUS CULTURE WITHIN THE LAST 5 DAYS. Performed at [facility], [place] [address] [place], [place] [address]    Report Status 05/10/2021 FINAL  Final  Blood Culture ID Panel (Reflexed)     Status: Abnormal   Collection Time: 04/17/21  9:20 AM  Result Value Ref Range Status   Enterococcus faecalis NOT DETECTED NOT DETECTED Final   Enterococcus Faecium NOT DETECTED NOT DETECTED Final   Listeria monocytogenes NOT DETECTED NOT DETECTED Final   Staphylococcus species DETECTED (A) NOT DETECTED Final  Comment: CRITICAL RESULT CALLED TO, READ BACK BY AND VERIFIED WITH: SAM RAUER 2203 04/17/21 LFD    Staphylococcus aureus (BCID) DETECTED (A) NOT DETECTED Final    Comment: Methicillin (oxacillin)-resistant Staphylococcus aureus (MRSA). MRSA is predictably resistant to beta-lactam antibiotics (except ceftaroline). Preferred therapy  is vancomycin unless clinically contraindicated. Patient requires contact precautions if  hospitalized. CRITICAL RESULT CALLED TO, READ BACK BY AND VERIFIED WITH: SAM RAUER 2203 04/17/21 LFD    Staphylococcus epidermidis NOT DETECTED NOT DETECTED Final   Staphylococcus lugdunensis NOT DETECTED NOT DETECTED Final   Streptococcus species NOT DETECTED NOT DETECTED Final   Streptococcus agalactiae NOT DETECTED NOT DETECTED Final   Streptococcus pneumoniae NOT DETECTED NOT DETECTED Final   Streptococcus pyogenes NOT DETECTED NOT DETECTED Final   A.calcoaceticus-baumannii NOT DETECTED NOT DETECTED Final   Bacteroides fragilis NOT DETECTED NOT DETECTED Final   Enterobacterales NOT DETECTED NOT DETECTED Final   Enterobacter cloacae complex NOT DETECTED NOT DETECTED Final   Escherichia coli NOT DETECTED NOT DETECTED Final   Klebsiella aerogenes NOT DETECTED NOT DETECTED Final   Klebsiella oxytoca NOT DETECTED NOT DETECTED Final   Klebsiella pneumoniae NOT DETECTED NOT DETECTED Final   Proteus species NOT DETECTED NOT DETECTED Final   Salmonella species NOT DETECTED NOT DETECTED Final   Serratia marcescens NOT DETECTED NOT DETECTED Final   Haemophilus influenzae NOT DETECTED NOT DETECTED Final   Neisseria meningitidis NOT DETECTED NOT DETECTED Final   Pseudomonas aeruginosa NOT DETECTED NOT DETECTED Final   Stenotrophomonas maltophilia NOT DETECTED NOT DETECTED Final   Candida albicans NOT DETECTED NOT DETECTED Final   Candida auris NOT DETECTED NOT DETECTED Final   Candida glabrata NOT DETECTED NOT DETECTED Final   Candida krusei NOT DETECTED NOT DETECTED Final   Candida parapsilosis NOT DETECTED NOT DETECTED Final   Candida tropicalis NOT DETECTED NOT DETECTED Final   Cryptococcus neoformans/gattii NOT DETECTED NOT DETECTED Final   Meth resistant mecA/C and MREJ DETECTED (A) NOT DETECTED Final    Comment: CRITICAL RESULT CALLED TO, READ BACK BY AND VERIFIED WITH: SAM RAUER 2203 04/17/21  LFD Performed at [facility], [address] [place], [place] [address]   Culture, blood (Routine x 2)     Status: Abnormal   Collection Time: 04/17/21  9:47 AM   Specimen: BLOOD  Result Value Ref Range Status   Specimen Description   Final    BLOOD RIGHT ARM Performed at [facility], [address] [place], [place] [address]    Special Requests   Final    BOTTLES DRAWN AEROBIC AND ANAEROBIC Blood Culture results may not be optimal due to an excessive volume of blood received in culture bottles Performed at [facility], [address] [place], [place] [address]    Culture  Setup Time   Final    GRAM POSITIVE COCCI IN BOTH AEROBIC AND ANAEROBIC BOTTLES CRITICAL VALUE NOTED.  VALUE IS CONSISTENT WITH PREVIOUSLY REPORTED AND CALLED VALUE. Performed at [facility], [address] [place], [place] [address]    Culture METHICILLIN RESISTANT STAPHYLOCOCCUS AUREUS (A)  Final   Report Status 04/21/2021 FINAL  Final   Organism ID, Bacteria METHICILLIN RESISTANT STAPHYLOCOCCUS AUREUS  Final      Susceptibility   Methicillin resistant staphylococcus aureus - MIC*    CIPROFLOXACIN >=8 RESISTANT Resistant     ERYTHROMYCIN <=0.25 SENSITIVE Sensitive     GENTAMICIN <=0.5 SENSITIVE Sensitive     OXACILLIN >=4 RESISTANT Resistant     TETRACYCLINE <=  1 SENSITIVE Sensitive     VANCOMYCIN <=0.5 SENSITIVE Sensitive     TRIMETH/SULFA <=10 SENSITIVE Sensitive     CLINDAMYCIN <=0.25 SENSITIVE Sensitive     RIFAMPIN <=0.5 SENSITIVE Sensitive     Inducible Clindamycin NEGATIVE Sensitive     * METHICILLIN RESISTANT STAPHYLOCOCCUS AUREUS  Urine Culture     Status: Abnormal   Collection Time: 04/17/21 11:58 AM   Specimen: Urine, Random  Result Value Ref Range Status   Specimen Description   Final    URINE, RANDOM Performed at [facility], [address], [place], [place] [address]    Special Requests   Final    NONE Performed at [facility], [address] [place], [place] [address]    Culture MULTIPLE SPECIES PRESENT, SUGGEST RECOLLECTION (A)  Final   Report Status 04/19/2021 FINAL  Final  Culture, blood (routine x 2)     Status: None (Preliminary result)   Collection Time: 04/19/21  7:55 AM   Specimen: BLOOD LEFT WRIST  Result Value Ref Range Status   Specimen Description BLOOD LEFT WRIST  Final   Special Requests   Final    BOTTLES DRAWN AEROBIC AND ANAEROBIC Blood Culture adequate volume   Culture   Final    NO GROWTH 4 DAYS Performed at [facility], [place] [address] [place], [place] [address]    Report Status PENDING  Incomplete  Culture, blood (routine x 2)     Status: None (Preliminary result)   Collection Time: 04/19/21  8:02 AM   Specimen: BLOOD LEFT HAND  Result Value Ref Range Status   Specimen Description BLOOD LEFT HAND  Final   Special Requests   Final    BOTTLES DRAWN AEROBIC AND ANAEROBIC Blood Culture adequate volume   Culture   Final    NO GROWTH 4 DAYS Performed at [facility], [place] [address], [place], [place] [address]    Report Status PENDING  Incomplete  MRSA Next Gen by PCR, Nasal     Status: Abnormal   Collection Time: 04/19/21 11:13 AM   Specimen: Nasal Mucosa; Nasal Swab  Result Value Ref Range Status   MRSA by PCR Next Gen DETECTED (A) NOT DETECTED Final    Comment: RESULT CALLED TO, READ BACK BY AND VERIFIED WITH: D WHITE RN 1503 04/19/21 A BROWNING (NOTE) The GeneXpert MRSA Assay (FDA approved for NASAL specimens only), is one component of a comprehensive MRSA colonization surveillance program. It is not intended to diagnose MRSA infection nor to guide or monitor treatment for MRSA infections. Test performance is not FDA approved in patients less than 33 years old. Performed at [facility], [place] [address], [place], [place] [address]   Aerobic/Anaerobic Culture w Gram Stain (surgical/deep wound)     Status: None (Preliminary result)   Collection  Time: 04/17/2021  8:23 AM   Specimen: Wound; Body Fluid  Result Value Ref Range Status   Specimen Description WOUND  Final   Special Requests  STERNAL WOUND  Final   Gram Stain   Final    FEW WBC PRESENT,BOTH PMN AND MONONUCLEAR FEW GRAM POSITIVE COCCI IN PAIRS Performed at [facility], [address], [place], [place] [address]    Culture   Final    MODERATE METHICILLIN RESISTANT STAPHYLOCOCCUS AUREUS NO ANAEROBES ISOLATED; CULTURE IN PROGRESS FOR 5 DAYS    Report Status PENDING  Incomplete   Organism ID, Bacteria METHICILLIN RESISTANT STAPHYLOCOCCUS AUREUS  Final      Susceptibility   Methicillin resistant staphylococcus aureus - MIC*    CIPROFLOXACIN >=8 RESISTANT Resistant     ERYTHROMYCIN <=0.25 SENSITIVE Sensitive     GENTAMICIN <=0.5 SENSITIVE Sensitive     OXACILLIN >=4 RESISTANT Resistant     TETRACYCLINE <=1 SENSITIVE Sensitive     VANCOMYCIN 1 SENSITIVE Sensitive     TRIMETH/SULFA <=10 SENSITIVE Sensitive     CLINDAMYCIN <=0.25 SENSITIVE Sensitive     RIFAMPIN <=0.5 SENSITIVE Sensitive     Inducible Clindamycin NEGATIVE Sensitive     * MODERATE METHICILLIN RESISTANT STAPHYLOCOCCUS AUREUS  Aerobic/Anaerobic Culture w Gram Stain (surgical/deep wound)     Status: None (Preliminary result)   Collection Time: 05/02/2021  8:27 AM   Specimen: PATH Bone biopsy; Tissue  Result Value Ref Range Status   Specimen Description TISSUE  Final   Special Requests  STERNUM  Final   Gram Stain   Final    FEW WBC PRESENT,BOTH PMN AND MONONUCLEAR FEW GRAM POSITIVE COCCI IN PAIRS Performed at [facility], [address] [place], [place] [address]    Culture   Final    MODERATE METHICILLIN RESISTANT STAPHYLOCOCCUS AUREUS NO ANAEROBES ISOLATED; CULTURE IN PROGRESS FOR 5 DAYS    Report Status PENDING  Incomplete   Organism ID, Bacteria METHICILLIN RESISTANT STAPHYLOCOCCUS AUREUS  Final      Susceptibility   Methicillin resistant staphylococcus aureus - MIC*     CIPROFLOXACIN >=8 RESISTANT Resistant     ERYTHROMYCIN <=0.25 SENSITIVE Sensitive     GENTAMICIN <=0.5 SENSITIVE Sensitive     OXACILLIN >=4 RESISTANT Resistant     TETRACYCLINE <=1 SENSITIVE Sensitive     VANCOMYCIN 1 SENSITIVE Sensitive     TRIMETH/SULFA <=10 SENSITIVE Sensitive     CLINDAMYCIN <=0.25 SENSITIVE Sensitive     RIFAMPIN <=0.5 SENSITIVE Sensitive     Inducible Clindamycin NEGATIVE Sensitive     * MODERATE METHICILLIN RESISTANT STAPHYLOCOCCUS AUREUS       Imaging Studies   DG Abd Portable 1V  Result Date: 04/21/2021 CLINICAL DATA:  Feeding tube placement EXAM: PORTABLE ABDOMEN - 1 VIEW COMPARISON:  None. FINDINGS: Feeding tube with the tip projecting over the antrum of the stomach. Gaseous distension of the small bowel and colon. No evidence of pneumoperitoneum, portal venous gas or pneumatosis. No pathologic calcifications along the expected course of the ureters. No acute osseous abnormality. IMPRESSION: Feeding tube with the tip projecting over the antrum of the stomach. Electronically Signed   By: Kathreen Devoid M.D.   On: 04/21/2021 16:29     Medications   Scheduled Meds:  acetaminophen  1,000 mg Per Tube Q6H   azelastine  1 spray Each Nare BID   chlorhexidine  15 mL Mouth Rinse BID   Chlorhexidine Gluconate Cloth  6 each Topical Daily   docusate  100 mg Per Tube BID   feeding supplement (PROSource TF)  45 mL Per Tube BID   mouth rinse  15 mL Mouth Rinse q12n4p   sodium chloride flush  3 mL Intravenous Q12H   Continuous Infusions:  0.45 % NaCl with KCl 20 mEq / L 50 mL/hr at 04/25/2021 2212   diltiazem (CARDIZEM) infusion 10 mg/hr (04/23/21 0655)   feeding supplement (JEVITY 1.5 CAL/FIBER) Stopped (05/08/2021 0000)   vancomycin 1,250 mg (04/23/21 0825)       LOS: 5 days    Bonnielee Haff,  [facility]  04/23/2021, 10:10 AM

## 2021-04-23 NOTE — Progress Notes (Signed)
Palliative:  Chart reviewed and patient assessed - patient does not wake to voice or touch. Concerns about tachycardia, aspiration, and worsening mental status noted. No family at bedside. Called spouse and left voice mail. Hopeful to discuss care plan moving forward. Will continue attempts to speak to wife.  Juel Burrow, DNP, AGNP-C Palliative Medicine Team Team Phone # [phone]  Pager # [phone]  NO CHARGE

## 2021-04-23 NOTE — Progress Notes (Signed)
Daily Progress Note   Patient Name: [name]       Date: 04/23/2021 DOB: 05/09/1936  Age: 85 y.o. MRN#: [id number] Attending Physician: Bonnielee Haff, MD Primary Care Physician: [name], DO Admit Date: 04/25/2021  Reason for Consultation/Follow-up: Establishing goals of care  Subjective: Patient does not respond to voice or touch  Length of Stay: 5  Current Medications: Scheduled Meds:   acetaminophen  1,000 mg Per Tube Q6H   azelastine  1 spray Each Nare BID   chlorhexidine  15 mL Mouth Rinse BID   Chlorhexidine Gluconate Cloth  6 each Topical Daily   docusate  100 mg Per Tube BID   feeding supplement (PROSource TF)  45 mL Per Tube BID   mouth rinse  15 mL Mouth Rinse q12n4p   sodium chloride flush  3 mL Intravenous Q12H    Continuous Infusions:  0.45 % NaCl with KCl 20 mEq / L 50 mL/hr at 05/02/2021 2212   diltiazem (CARDIZEM) infusion 10 mg/hr (04/23/21 0655)   feeding supplement (JEVITY 1.5 CAL/FIBER) 55 mL/hr at 04/23/21 0830   potassium chloride 10 mEq (04/23/21 1258)   vancomycin 1,250 mg (04/23/21 0825)    PRN Meds: acetaminophen **OR** acetaminophen, bisacodyl, haloperidol lactate, morphine injection, ondansetron **OR** ondansetron (ZOFRAN) IV, polyethylene glycol, traMADol  Physical Exam Constitutional:      Comments: unresponsive  Pulmonary:     Effort: Pulmonary effort is normal.  Skin:    General: Skin is warm and dry.            Vital Signs: BP 131/73 (BP Location: Right Arm)   Pulse 89   Temp 98 F (36.7 C) (Axillary)   Resp 18   Ht 6' 0.01" (1.829 m)   Wt 88.7 kg   SpO2 100%   BMI 26.52 kg/m  SpO2: SpO2: 100 % O2 Device: O2 Device: Nasal Cannula O2 Flow Rate: O2 Flow Rate (L/min): 1 L/min  Intake/output summary:  Intake/Output  Summary (Last 24 hours) at 04/23/2021 1321 Last data filed at 04/23/2021 0600 Gross per 24 hour  Intake 300 ml  Output 3000 ml  Net -2700 ml   LBM:   Baseline Weight: Weight: 89.4 kg Most recent weight: Weight: 88.7 kg       Palliative Assessment/Data: PPS 30% on tube feeds - 10% without      Patient Active Problem List   Diagnosis Date Noted   Protein-calorie malnutrition, severe 04/21/2021   Osteomyelitis of thoracic region [facility])    Sternal wound infection 04/28/2021   SDH (subdural hematoma) 04/28/2021   Pneumonia involving right lung 05/09/2021   Delirium 04/19/2021   DNR (do not resuscitate) 04/21/2021   Goals of care, counseling/discussion    Atrial fibrillation with RVR ([place]) 04/17/2021   DDD (degenerative disc disease), lumbar 03/03/2021   Cellulitis 02/18/2021   Lumbar compression fracture ([place]) 02/18/2021   Compression fracture of first lumbar vertebra ([place]) 02/17/2021   Cellulitis and abscess of foot    MRSA bacteremia 08/10/2020   Sepsis due to methicillin resistant Staphylococcus aureus (MRSA) ([place]) 04/24/2020   Cellulitis of knee, left 04/22/2020   Hypokalemia 04/22/2020   Osteoarthritis of multiple joints 12/15/2017   Hyponatremia 03/28/2017  Abnormality of gait and mobility 01/11/2017   Cloudy vision 01/11/2017   Foot drop, bilateral 01/11/2017   Amputee, great toe, left (HCC) 01/11/2017   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   Macrocytic anemia 09/22/2015   Moderate tricuspid insufficiency 04/21/2015   History of prostate cancer 04/16/2015   Idiopathic peripheral neuropathy 04/16/2015   Essential hypertension 04/16/2015   GERD (gastroesophageal reflux disease) 04/16/2015   Arthritis 04/16/2015   Bilateral lower extremity edema 04/16/2015   Benign essential tremor 10/30/2014   Hyperlipidemia, mixed 42/35/3614   Chronic systolic CHF (congestive heart failure), NYHA class 2 ([place]) 04/23/2014   Mitral insufficiency 11/05/2013    CAD (coronary artery disease) 11/01/2013   Basal cell carcinoma of skin of other parts of face 03/14/2013   Ankle sprain 09/09/2011   History of fibula fracture 09/09/2011    Palliative Care Assessment & Plan   HPI: 85 y.o. male with past medical history of HTN; HLD; remote prostate CA; CAD s/p remote CABG; dementia, recurrent falls, and abdominal wall melanoma admitted on 04/27/2021 with sepsis from [facility] - new onset fever, respiratory distress after discharging from from [facility] yesterday.   Patient admitted to [facility] from 10/29-11/4 after presenting with a fall with LOC.  He was found to have an acute SDH. At [facility] ED, ED scan showed mediastinum fluid collection with gas, with a transverse fracture of sternum. PMT has been consulted to assist with goals of care conversation.   Assessment: Unfortunately it appears that Mr. Broxterman is declining with worsening mental status. Concerns about ongoing aspiration. Continuing to have tachycardia - remains on cardizem infusion.   Spoke with wife today - she is quite concerned about mental status. Tells me about interacting with patient 2 days ago and he was very interactive in her opinion. She feels his current status is because he responds better to family than he does to hospital staff. I share that while he certainly does respond to family better I am concerned that his condition is worsening r/t his multiple medical problems. She tells me she plans to come to the hospital tomorrow around 10:30 and wants to see how he interacts with her then. She remains very hopeful for improvement in mental status.   I shared SLP eval with her - shared that patient did not verbally communicate and could not follow commands. Discussed he was very unsafe to take in POs d/t lack of awareness.   I offered to meet with her in person tomorrow when she is at bedside - she is agreeable.   Recommendations/Plan: Patient with declining mental status - family updated, family very hopeful  for improvement Wife plans to be at bedside tomorrow ~10:30 - will plan to meet with her then and discuss goals of care moving forward  Code Status: DNR  Care plan was discussed with RN and wife  Thank you for allowing the Palliative Medicine Team to assist in the care of this patient.   Total Time 25 minutes Prolonged Time Billed  no       Greater than 50%  of this time was spent counseling and coordinating care related to the above assessment and plan.  Juel Burrow, DNP, [facility] Palliative Medicine Team Team Phone # [phone]  Pager [phone]

## 2021-04-23 NOTE — Care Management Important Message (Signed)
Important Message  Patient Details  Name: [name] MRN: [id number] Date of Birth: 03/31/1936   Medicare Important Message Given:  Yes     Shelda Altes 04/23/2021, 11:50 AM

## 2021-04-24 DIAGNOSIS — Z66 Do not resuscitate: Secondary | ICD-10-CM | POA: Diagnosis not present

## 2021-04-24 DIAGNOSIS — Z515 Encounter for palliative care: Secondary | ICD-10-CM | POA: Diagnosis not present

## 2021-04-24 DIAGNOSIS — I4891 Unspecified atrial fibrillation: Secondary | ICD-10-CM | POA: Diagnosis not present

## 2021-04-24 DIAGNOSIS — S065XAA Traumatic subdural hemorrhage with loss of consciousness status unknown, initial encounter: Secondary | ICD-10-CM | POA: Diagnosis not present

## 2021-04-24 DIAGNOSIS — Z7189 Other specified counseling: Secondary | ICD-10-CM | POA: Diagnosis not present

## 2021-04-24 DIAGNOSIS — R410 Disorientation, unspecified: Secondary | ICD-10-CM | POA: Diagnosis not present

## 2021-04-24 DIAGNOSIS — S21101A Unspecified open wound of right front wall of thorax without penetration into thoracic cavity, initial encounter: Secondary | ICD-10-CM | POA: Diagnosis not present

## 2021-04-24 DIAGNOSIS — A4102 Sepsis due to Methicillin resistant Staphylococcus aureus: Secondary | ICD-10-CM | POA: Diagnosis not present

## 2021-04-24 LAB — CBC
HCT: 28.7 % — ABNORMAL LOW (ref 39.0–52.0)
Hemoglobin: 9.5 g/dL — ABNORMAL LOW (ref 13.0–17.0)
MCH: 34.7 pg — ABNORMAL HIGH (ref 26.0–34.0)
MCHC: 33.1 g/dL (ref 30.0–36.0)
MCV: 104.7 fL — ABNORMAL HIGH (ref 80.0–100.0)
Platelets: 540 10*3/uL — ABNORMAL HIGH (ref 150–400)
RBC: 2.74 MIL/uL — ABNORMAL LOW (ref 4.22–5.81)
RDW: 15.4 % (ref 11.5–15.5)
WBC: 14.4 10*3/uL — ABNORMAL HIGH (ref 4.0–10.5)
nRBC: 0.3 % — ABNORMAL HIGH (ref 0.0–0.2)

## 2021-04-24 LAB — GLUCOSE, CAPILLARY
Glucose-Capillary: 135 mg/dL — ABNORMAL HIGH (ref 70–99)
Glucose-Capillary: 111 mg/dL — ABNORMAL HIGH (ref 70–99)
Glucose-Capillary: 120 mg/dL — ABNORMAL HIGH (ref 70–99)
Glucose-Capillary: 116 mg/dL — ABNORMAL HIGH (ref 70–99)

## 2021-04-24 LAB — CULTURE, BLOOD (ROUTINE X 2)
Culture: NO GROWTH
Culture: NO GROWTH
Special Requests: ADEQUATE
Special Requests: ADEQUATE

## 2021-04-24 LAB — BASIC METABOLIC PANEL
Anion gap: 7 (ref 5–15)
BUN: 18 mg/dL (ref 8–23)
CO2: 24 mmol/L (ref 22–32)
Calcium: 8.9 mg/dL (ref 8.9–10.3)
Chloride: 108 mmol/L (ref 98–111)
Creatinine, Ser: 0.74 mg/dL (ref 0.61–1.24)
GFR, Estimated: >60 mL/min (ref >60)
Glucose, Bld: 142 mg/dL — ABNORMAL HIGH (ref 70–99)
Potassium: 3.8 mmol/L (ref 3.5–5.1)
Sodium: 139 mmol/L (ref 135–145)

## 2021-04-24 MED ORDER — DILTIAZEM 12 MG/ML ORAL SUSPENSION
30.0000 mg | Freq: Four times a day (QID) | ORAL | Status: DC
  Administered 2021-04-24 – 2021-04-26 (×8): 30 mg
  Filled 2021-04-24 (×10): qty 3

## 2021-04-24 NOTE — Progress Notes (Signed)
PROGRESS NOTE    [name]   OYD:[id number]  DOB: 1935/11/28  PCP: [name], DO    DOA: 05/01/2021 LOS: 6    Brief Narrative / Hospital Course to Date:   85 y.o. male with medical history significant of HTN; HLD; remote prostate CA; CAD s/p remote CABG; and abdominal wall melanoma presenting to [facility] on 04/17/21 with sepsis, presenting with fever, encephalopathy, respiratory distress and A-fib RVR.  Transferred to [facility] on 04/17/2021 for CT surgery coverage due to a mediastinal fluid collection associated with transverse sternal fracture.  Patient found to have Staph bacteremia with findings of pulmonary septic emboli on CT scan.  Infectious disease, CT surgery, neurosurgery, palliative care consulted.   Assessment & Plan    MRSA bacteremia with severe sepsis, present on admission Sepsis POA with leukocytosis, tachycardia, tachypnea, hypoxia.  Evidence of acute organ failure with elevated lactate >2 that is not easily explained by another condition. Blood cultures were positive for MRSA. Septic emboli appreciated on imaging. Suspected source is unknown - PNA, spinal osteo, recent skin surgery are considerations (see below) Infectious disease was consulted.  Patient remains on vancomycin. Transthoracic echocardiogram showed normal systolic function.  No evidence for vegetation was noted. Remains afebrile.  WBC noted to be elevated at 14.3.  Continue current management for now. Patient remains afebrile.  Continued on vancomycin.   Infectious disease is considering TEE.    Anterior mediastinal abscess/phlegmon  Mediastinal fluid collection with gas/fluid with transverse sternal fracture.  Cardiothoracic surgery was consulted. Patient taken for I&D on 11/8 and wound VAC placed. Underwent a wound VAC change in the OR on 11/10.   Subdural Hematoma (SDH) Patient has had recent SDH x 2, acute on chronic, seen at [facility].  This was secondary to multiple falls. Also with  compression fractures, ?osteomyelitis Anticoagulation and ASA are contraindicated at this time.   Right midlung PNA vs Septic Emboli  Appears to have septic emboli on imaging. Treated with Vanc/Zosyn on admission. Zosyn d/c'd per ID Continue Vancomycin Respiratory status stable for the most part.  Hypokalemia  Potassium will be repleted.  Magnesium is 2.4.   New onset A-fib - POA, Likely triggered by infection. Anticoagulation contraindicated in setting of SDH. Remains on Cardizem infusion.  Patient had episode of tachycardia overnight which got controlled after he was given metoprolol.   Change Cardizem from IV to feeding tube.  Oropharyngeal dysphagia Mentation seems to be better today.  He appears to be more awake and alert.  May be reasonable to have speech therapy reevaluate him.  Nutrition Due to extremely poor oral intake cortrak feeding tube was placed.  Thoracolumbar compression fractures with ? Osteomyelitis  No surgical intervention planned.  Seen by neurosurgery.  On antibiotics per ID.   Abdominal wall melanoma Has steri-strips in place, no obvious infection.  Path reports from excision reflect negative margins. Monitor.   Left AC superficial thrombophlebitis  Conservative management.  No DVT is noted on imaging studies.  Macrocytic anemia Drop in hemoglobin likely dilutional.  Hemoglobin is stable for the most part.  No overt bleeding noted.   Anemia panel reviewed.  No clear-cut deficiencies identified.  TSH is noted to be 1.2 with a normal free T4.   Essential hypertension  Norvasc and Cozaar on hold. Currently on Cardizem infusion for A. fib as mentioned above.   Hyperlipidemia Hold pravastatin for now with AMS, resume when safe to take PO  Remote Prostate CA  Bladder wall thickening noted on  imaging.  Monitor for now.  Outpatient follow-up with urology   Coronary artery disease s/p remote CABG   Delirium/acute metabolic encephalopathy Most likely  due to infection, unclear if recent traumatic SDH contributing.  Hold sedating meds: Primidone, benadryl (consider d/c), Neurontin Mentation seems to be slightly better today.  Continue to monitor for now.  Continue to hold his sedating medications.  Goals of care Palliative care has been consulted since patient's prognosis appears to be poor.  Discussed with patient's wife.  Patient has a son who lives out of town.  Patient's wife was told about guarded to poor prognosis especially if he does not start eating and drinking on his own.   Yesterday patient was quite lethargic and unresponsive.  Noted to be more awake today.  Palliative care plans to meet with wife this morning.   DVT prophylaxis: SCDs CODE STATUS: DNR Family communication: Discussed with wife yesterday. Disposition: To be determined  Status is: Inpatient  Remains inpatient appropriate because: Need for IV antibiotics and further surgical debridement     Subjective   Patient noted to be awake today.  Still distracted and confused but more responsive compared to yesterday.    Consults, Procedures, Significant Events   Consultants:  Infectious disease Palliative care Cardiothoracic surgery Neurosurgery  Procedures:  Irrigation and drainage of the mediastinal abscess.  Antimicrobials:  Anti-infectives (From admission, onward)    Start     Dose/Rate Route Frequency Ordered Stop   04/23/21 0830  vancomycin (VANCOREADY) IVPB 1250 mg/250 mL        1,250 mg 166.7 mL/hr over 90 Minutes Intravenous Every 24 hours 05/07/2021 1433     04/19/21 0830  vancomycin (VANCOREADY) IVPB 1750 mg/350 mL  Status:  Discontinued        1,750 mg 175 mL/hr over 120 Minutes Intravenous Every 24 hours 04/19/21 0742 04/13/2021 1433   04/19/2021 2000  vancomycin (VANCOREADY) IVPB 1750 mg/350 mL  Status:  Discontinued        1,750 mg 175 mL/hr over 120 Minutes Intravenous Every 24 hours 04/16/2021 1007 04/19/2021 1706   05/08/2021 1400   piperacillin-tazobactam (ZOSYN) IVPB 3.375 g  Status:  Discontinued        3.375 g 12.5 mL/hr over 240 Minutes Intravenous Every 8 hours 05/03/2021 1007 04/19/21 0742         Micro    Objective   Vitals:   04/23/21 1949 04/23/21 2303 04/24/21 0409 04/24/21 0800  BP: (!) 158/86 (!) 151/89 (!) 157/88 (!) 145/86  Pulse: 99 98 99 95  Resp: 17 16 16 16   Temp: 98.4 F (36.9 C) 98.6 F (37 C) 98.2 F (36.8 C) 99.2 F (37.3 C)  TempSrc: Axillary Axillary Axillary Oral  SpO2: 100% 100% 99% 100%  Weight:   93.1 kg   Height:        Intake/Output Summary (Last 24 hours) at 04/24/2021 1008 Last data filed at 04/24/2021 0833 Gross per 24 hour  Intake 1564.86 ml  Output 1850 ml  Net -285.14 ml    Filed Weights   05/03/2021 0408 04/23/21 0331 04/24/21 0409  Weight: 88.8 kg 88.7 kg 93.1 kg    Physical Exam:  General appearance: Awake but distracted.  In no distress Resp: Clear to auscultation bilaterally.  Normal effort Cardio: S1-S2 is irregularly irregular GI: Abdomen is soft.  Nontender nondistended.  Bowel sounds are present normal.  No masses organomegaly Extremities: No edema.   Neurologic:   No focal neurological deficits.  Labs   Data Reviewed: I have personally reviewed following labs and imaging studies  CBC: Recent Labs  Lab 05/03/2021 0539 04/19/21 0256 04/14/2021 0249 04/21/21 0623 05/04/2021 0540 04/23/21 0614 04/24/21 0236  WBC 11.6*   < > 13.0* 9.9 12.7* 14.3* 14.4*  NEUTROABS 10.2*  --   --   --   --   --   --   HGB 8.9*   < > 8.0* 7.5* 8.8* 8.5* 9.5*  HCT 27.3*   < > 22.8* 22.7* 26.1* 25.4* 28.7*  MCV 106.6*   < > 98.7 101.8* 102.4* 103.3* 104.7*  PLT 296   < > 420* 495* 582* 601* 540*   < > = values in this interval not displayed.    Basic Metabolic Panel: Recent Labs  Lab 04/25/2021 0603 05/03/2021 1439 04/21/21 0210 04/21/21 1602 04/21/2021 0540 04/21/2021 1613 04/23/21 0614 04/24/21 0236  NA 139  --  140  --  141  --  138 139  K 2.8*  3.8 3.8  --  2.8*  --  3.3* 3.8  CL 109  --  111  --  108  --  108 108  CO2 20*  --  18*  --  24  --  24 24  GLUCOSE 128*  --  141*  --  124*  --  146* 142*  BUN 19  --  22  --  23  --  17 18  CREATININE 0.88  --  0.90  --  0.70  --  0.70 0.74  CALCIUM 9.7  --  9.3  --  9.4  --  8.9 8.9  MG 1.8  --  1.8 1.8 1.8 1.6* 2.4  --   PHOS  --   --   --  3.2 3.4 3.5 3.7  --     GFR: Estimated Creatinine Clearance: 76.8 mL/min (by C-G formula based on SCr of 0.74 mg/dL). Liver Function Tests: Recent Labs  Lab 04/27/2021 0539 04/19/21 1901  AST 43* 182*  ALT 24 94*  ALKPHOS 54 107  BILITOT 1.3* 2.0*  PROT 6.0* 5.9*  ALBUMIN 2.0* 1.8*     Coagulation Profile: Recent Labs  Lab 04/19/21 1901  INR 1.9*    CBG: Recent Labs  Lab 04/23/21 1252 04/23/21 1622 04/23/21 1948 04/23/21 2357 04/24/21 0413  GLUCAP 139* 155* 102* 122* 135*    Sepsis Labs: Recent Labs  Lab 04/17/21 1158 05/11/2021 0539 05/11/2021 0737 05/08/2021 0603 04/21/21 0210  PROCALCITON  --   --  3.69 1.36 0.77  LATICACIDVEN 1.6 1.6  --   --   --      Recent Results (from the past 240 hour(s))  Resp Panel by RT-PCR (Flu A&B, Covid) Nasopharyngeal Swab     Status: None   Collection Time: 04/17/21  8:56 AM   Specimen: Nasopharyngeal Swab; Nasopharyngeal(NP) swabs in vial transport medium  Result Value Ref Range Status   SARS Coronavirus 2 by RT PCR NEGATIVE NEGATIVE Final    Comment: (NOTE) SARS-CoV-2 target nucleic acids are NOT DETECTED.  The SARS-CoV-2 RNA is generally detectable in upper respiratory specimens during the acute phase of infection. The lowest concentration of SARS-CoV-2 viral copies this assay can detect is 138 copies/mL. A negative result does not preclude SARS-Cov-2 infection and should not be used as the sole basis for treatment or other patient management decisions. A negative result may occur with  improper specimen collection/handling, submission of specimen other than nasopharyngeal  swab, presence of viral mutation(s) within  the areas targeted by this assay, and inadequate number of viral copies(<138 copies/mL). A negative result must be combined with clinical observations, patient history, and epidemiological information. The expected result is Negative.  Fact Sheet for Patients:  EntrepreneurPulse.com.au  Fact Sheet for Healthcare Providers:  IncredibleEmployment.be  This test is no t yet approved or cleared by the [place] FDA and  has been authorized for detection and/or diagnosis of SARS-CoV-2 by FDA under an Emergency Use Authorization (EUA). This EUA will remain  in effect (meaning this test can be used) for the duration of the COVID-19 declaration under Section 564(b)(1) of the Act, 21 U.S.C.section 360bbb-3(b)(1), unless the authorization is terminated  or revoked sooner.       Influenza A by PCR NEGATIVE NEGATIVE Final   Influenza B by PCR NEGATIVE NEGATIVE Final    Comment: (NOTE) The Xpert Xpress SARS-CoV-2/FLU/RSV plus assay is intended as an aid in the diagnosis of influenza from Nasopharyngeal swab specimens and should not be used as a sole basis for treatment. Nasal washings and aspirates are unacceptable for Xpert Xpress SARS-CoV-2/FLU/RSV testing.  Fact Sheet for Patients: EntrepreneurPulse.com.au  Fact Sheet for Healthcare Providers: IncredibleEmployment.be  This test is not yet approved or cleared by the [place] FDA and has been authorized for detection and/or diagnosis of SARS-CoV-2 by FDA under an Emergency Use Authorization (EUA). This EUA will remain in effect (meaning this test can be used) for the duration of the COVID-19 declaration under Section 564(b)(1) of the Act, 21 U.S.C. section 360bbb-3(b)(1), unless the authorization is terminated or revoked.  Performed at [facility], [address] [place], [place] [address]   Culture,  blood (Routine x 2)     Status: Abnormal   Collection Time: 04/17/21  9:20 AM   Specimen: BLOOD  Result Value Ref Range Status   Specimen Description   Final    BLOOD RIGHT FA Performed at [facility], [address], [place], [place] [address]    Special Requests   Final    BOTTLES DRAWN AEROBIC AND ANAEROBIC Blood Culture adequate volume Performed at [facility], [address] [place], [place] [address]    Culture  Setup Time   Final    GRAM POSITIVE COCCI IN BOTH AEROBIC AND ANAEROBIC BOTTLES Organism ID to follow CRITICAL RESULT CALLED TO, READ BACK BY AND VERIFIED WITH: SAM RAUER 2203 04/17/21 LFD Performed at [facility], [address] [place], [place] [address]    Culture (A)  Final    STAPHYLOCOCCUS AUREUS SUSCEPTIBILITIES PERFORMED ON PREVIOUS CULTURE WITHIN THE LAST 5 DAYS. Performed at [facility], [address] [place],  [address]    Report Status 04/30/2021 FINAL  Final  Blood Culture ID Panel (Reflexed)     Status: Abnormal   Collection Time: 04/17/21  9:20 AM  Result Value Ref Range Status   Enterococcus faecalis NOT DETECTED NOT DETECTED Final   Enterococcus Faecium NOT DETECTED NOT DETECTED Final   Listeria monocytogenes NOT DETECTED NOT DETECTED Final   Staphylococcus species DETECTED (A) NOT DETECTED Final    Comment: CRITICAL RESULT CALLED TO, READ BACK BY AND VERIFIED WITH: SAM RAUER 2203 04/17/21 LFD    Staphylococcus aureus (BCID) DETECTED (A) NOT DETECTED Final    Comment: Methicillin (oxacillin)-resistant Staphylococcus aureus (MRSA). MRSA is predictably resistant to beta-lactam antibiotics (except ceftaroline). Preferred therapy is vancomycin unless clinically contraindicated. Patient requires contact precautions if  hospitalized. CRITICAL RESULT CALLED TO, READ BACK BY AND  VERIFIED WITH: SAM RAUER 2203 04/17/21 LFD    Staphylococcus epidermidis NOT DETECTED NOT DETECTED Final   Staphylococcus  lugdunensis NOT DETECTED NOT DETECTED Final   Streptococcus species NOT DETECTED NOT DETECTED Final   Streptococcus agalactiae NOT DETECTED NOT DETECTED Final   Streptococcus pneumoniae NOT DETECTED NOT DETECTED Final   Streptococcus pyogenes NOT DETECTED NOT DETECTED Final   A.calcoaceticus-baumannii NOT DETECTED NOT DETECTED Final   Bacteroides fragilis NOT DETECTED NOT DETECTED Final   Enterobacterales NOT DETECTED NOT DETECTED Final   Enterobacter cloacae complex NOT DETECTED NOT DETECTED Final   Escherichia coli NOT DETECTED NOT DETECTED Final   Klebsiella aerogenes NOT DETECTED NOT DETECTED Final   Klebsiella oxytoca NOT DETECTED NOT DETECTED Final   Klebsiella pneumoniae NOT DETECTED NOT DETECTED Final   Proteus species NOT DETECTED NOT DETECTED Final   Salmonella species NOT DETECTED NOT DETECTED Final   Serratia marcescens NOT DETECTED NOT DETECTED Final   Haemophilus influenzae NOT DETECTED NOT DETECTED Final   Neisseria meningitidis NOT DETECTED NOT DETECTED Final   Pseudomonas aeruginosa NOT DETECTED NOT DETECTED Final   Stenotrophomonas maltophilia NOT DETECTED NOT DETECTED Final   Candida albicans NOT DETECTED NOT DETECTED Final   Candida auris NOT DETECTED NOT DETECTED Final   Candida glabrata NOT DETECTED NOT DETECTED Final   Candida krusei NOT DETECTED NOT DETECTED Final   Candida parapsilosis NOT DETECTED NOT DETECTED Final   Candida tropicalis NOT DETECTED NOT DETECTED Final   Cryptococcus neoformans/gattii NOT DETECTED NOT DETECTED Final   Meth resistant mecA/C and MREJ DETECTED (A) NOT DETECTED Final    Comment: CRITICAL RESULT CALLED TO, READ BACK BY AND VERIFIED WITH: SAM RAUER 2203 04/17/21 LFD Performed at [facility], [address] [place], [place] [address]   Culture, blood (Routine x 2)     Status: Abnormal   Collection Time: 04/17/21  9:47 AM   Specimen: BLOOD  Result Value Ref Range Status   Specimen Description   Final    BLOOD RIGHT  ARM Performed at [facility], [address] [place], [place] [address]    Special Requests   Final    BOTTLES DRAWN AEROBIC AND ANAEROBIC Blood Culture results may not be optimal due to an excessive volume of blood received in culture bottles Performed at [facility], [address] [place], [place] [address]    Culture  Setup Time   Final    GRAM POSITIVE COCCI IN BOTH AEROBIC AND ANAEROBIC BOTTLES CRITICAL VALUE NOTED.  VALUE IS CONSISTENT WITH PREVIOUSLY REPORTED AND CALLED VALUE. Performed at [facility], [address] [place],  [address]    Culture METHICILLIN RESISTANT STAPHYLOCOCCUS AUREUS (A)  Final   Report Status 04/29/2021 FINAL  Final   Organism ID, Bacteria METHICILLIN RESISTANT STAPHYLOCOCCUS AUREUS  Final      Susceptibility   Methicillin resistant staphylococcus aureus - MIC*    CIPROFLOXACIN >=8 RESISTANT Resistant     ERYTHROMYCIN <=0.25 SENSITIVE Sensitive     GENTAMICIN <=0.5 SENSITIVE Sensitive     OXACILLIN >=4 RESISTANT Resistant     TETRACYCLINE <=1 SENSITIVE Sensitive     VANCOMYCIN <=0.5 SENSITIVE Sensitive     TRIMETH/SULFA <=10 SENSITIVE Sensitive     CLINDAMYCIN <=0.25 SENSITIVE Sensitive     RIFAMPIN <=0.5 SENSITIVE Sensitive     Inducible Clindamycin NEGATIVE Sensitive     * METHICILLIN RESISTANT STAPHYLOCOCCUS AUREUS  Urine Culture     Status: Abnormal   Collection Time: 04/17/21 11:58  AM   Specimen: Urine, Random  Result Value Ref Range Status   Specimen Description   Final    URINE, RANDOM Performed at [facility], [address] [place], [place] [address]    Special Requests   Final    NONE Performed at [facility], [address] [place], [place] [address]    Culture MULTIPLE SPECIES PRESENT, SUGGEST RECOLLECTION (A)  Final   Report Status 04/19/2021 FINAL  Final  Culture, blood (routine x 2)     Status: None   Collection Time: 04/19/21  7:55 AM   Specimen: BLOOD LEFT  WRIST  Result Value Ref Range Status   Specimen Description BLOOD LEFT WRIST  Final   Special Requests   Final    BOTTLES DRAWN AEROBIC AND ANAEROBIC Blood Culture adequate volume   Culture   Final    NO GROWTH 5 DAYS Performed at [facility], [place] [address] [place], [place] [address]    Report Status 04/24/2021 FINAL  Final  Culture, blood (routine x 2)     Status: None   Collection Time: 04/19/21  8:02 AM   Specimen: BLOOD LEFT HAND  Result Value Ref Range Status   Specimen Description BLOOD LEFT HAND  Final   Special Requests   Final    BOTTLES DRAWN AEROBIC AND ANAEROBIC Blood Culture adequate volume   Culture   Final    NO GROWTH 5 DAYS Performed at [facility], [place] [address] [place], [place] [address]    Report Status 04/24/2021 FINAL  Final  MRSA Next Gen by PCR, Nasal     Status: Abnormal   Collection Time: 04/19/21 11:13 AM   Specimen: Nasal Mucosa; Nasal Swab  Result Value Ref Range Status   MRSA by PCR Next Gen DETECTED (A) NOT DETECTED Final    Comment: RESULT CALLED TO, READ BACK BY AND VERIFIED WITH: D WHITE RN 1503 04/19/21 A BROWNING (NOTE) The GeneXpert MRSA Assay (FDA approved for NASAL specimens only), is one component of a comprehensive MRSA colonization surveillance program. It is not intended to diagnose MRSA infection nor to guide or monitor treatment for MRSA infections. Test performance is not FDA approved in patients less than 19 years old. Performed at [facility], [place] [address], [place], [place] [address]   Aerobic/Anaerobic Culture w Gram Stain (surgical/deep wound)     Status: None (Preliminary result)   Collection Time: 04/21/2021  8:23 AM   Specimen: Wound; Body Fluid  Result Value Ref Range Status   Specimen Description WOUND  Final   Special Requests  STERNAL WOUND  Final   Gram Stain   Final    FEW WBC PRESENT,BOTH PMN AND MONONUCLEAR FEW GRAM POSITIVE COCCI IN PAIRS Performed at [facility], [address], [place], [place] [address]    Culture   Final    MODERATE METHICILLIN RESISTANT STAPHYLOCOCCUS AUREUS NO ANAEROBES ISOLATED; CULTURE IN PROGRESS FOR 5 DAYS    Report Status PENDING  Incomplete   Organism ID, Bacteria METHICILLIN RESISTANT STAPHYLOCOCCUS AUREUS  Final      Susceptibility   Methicillin resistant staphylococcus aureus - MIC*    CIPROFLOXACIN >=8 RESISTANT Resistant     ERYTHROMYCIN <=0.25 SENSITIVE Sensitive     GENTAMICIN <=0.5 SENSITIVE Sensitive     OXACILLIN >=4 RESISTANT Resistant     TETRACYCLINE <=1 SENSITIVE Sensitive     VANCOMYCIN 1 SENSITIVE Sensitive     TRIMETH/SULFA <=10 SENSITIVE  Sensitive     CLINDAMYCIN <=0.25 SENSITIVE Sensitive     RIFAMPIN <=0.5 SENSITIVE Sensitive     Inducible Clindamycin NEGATIVE Sensitive     * MODERATE METHICILLIN RESISTANT STAPHYLOCOCCUS AUREUS  Aerobic/Anaerobic Culture w Gram Stain (surgical/deep wound)     Status: None (Preliminary result)   Collection Time: 04/13/2021  8:27 AM   Specimen: PATH Bone biopsy; Tissue  Result Value Ref Range Status   Specimen Description TISSUE  Final   Special Requests  STERNUM  Final   Gram Stain   Final    FEW WBC PRESENT,BOTH PMN AND MONONUCLEAR FEW GRAM POSITIVE COCCI IN PAIRS Performed at [facility], [address], [place], [place] [address]    Culture   Final    MODERATE METHICILLIN RESISTANT STAPHYLOCOCCUS AUREUS NO ANAEROBES ISOLATED; CULTURE IN PROGRESS FOR 5 DAYS    Report Status PENDING  Incomplete   Organism ID, Bacteria METHICILLIN RESISTANT STAPHYLOCOCCUS AUREUS  Final      Susceptibility   Methicillin resistant staphylococcus aureus - MIC*    CIPROFLOXACIN >=8 RESISTANT Resistant     ERYTHROMYCIN <=0.25 SENSITIVE Sensitive     GENTAMICIN <=0.5 SENSITIVE Sensitive     OXACILLIN >=4 RESISTANT Resistant     TETRACYCLINE <=1 SENSITIVE Sensitive     VANCOMYCIN 1 SENSITIVE Sensitive     TRIMETH/SULFA <=10 SENSITIVE Sensitive     CLINDAMYCIN <=0.25 SENSITIVE  Sensitive     RIFAMPIN <=0.5 SENSITIVE Sensitive     Inducible Clindamycin NEGATIVE Sensitive     * MODERATE METHICILLIN RESISTANT STAPHYLOCOCCUS AUREUS       Imaging Studies   No results found.   Medications   Scheduled Meds:  acetaminophen  1,000 mg Per Tube Q6H   azelastine  1 spray Each Nare BID   chlorhexidine  15 mL Mouth Rinse BID   Chlorhexidine Gluconate Cloth  6 each Topical Daily   docusate  100 mg Per Tube BID   feeding supplement (PROSource TF)  45 mL Per Tube BID   mouth rinse  15 mL Mouth Rinse q12n4p   sodium chloride flush  3 mL Intravenous Q12H   Continuous Infusions:  0.45 % NaCl with KCl 20 mEq / L 50 mL/hr at 04/24/21 0800   diltiazem (CARDIZEM) infusion 5 mg/hr (04/24/21 0800)   feeding supplement (JEVITY 1.5 CAL/FIBER) 1,000 mL (04/23/21 1707)   vancomycin 1,250 mg (04/24/21 0924)       LOS: 6 days    Bonnielee Haff,  [facility]  04/24/2021, 10:08 AM

## 2021-04-24 NOTE — TOC Initial Note (Signed)
Transition of Care [facility]) - Initial/Assessment Note    Patient Details  Name: [name] MRN: [id number] Date of Birth: 10-22-1935  Transition of Care [facility]) CM/SW Contact:    Bethena Roys, RN Phone Number: 04/24/2021, 3:19 PM  Clinical Narrative:  Case Manager received a secure chat from the palliative nurse practitioner- regarding the wife wanting the patient to return. Prior to arrival the patient was from home with support of spouse, aides with various schedules and he is active with [place], RN, PT,OT-If the provider can add a CSW to the orders as well. Case Manager reached out to the [facility] Liaison and they can continue to service the patient. The patient has durable medical equipment (DME) wheelchair and hospital bed in the home. Spouse asked for a riser for the hospital bed- bed obtained via [employer]. Case Manager called [employer] to reach out to the spouse regarding the riser and the specifics. Per palliative notes, spouse wants the patient to return home on Monday. Unsure if the patient will need the Wound VAC for home-if so, please place a consult for Case Management to order for home. Per palliative note, spouse asked about IV antibiotics-Case Manager did call Pam with [employer] just in case to follow along with the case. Case Manager will continue to follow for transition of care needs.                  Expected Discharge Plan: [place] Barriers to Discharge: Continued Medical Work up   Patient Goals and CMS Choice Patient states their goals for this hospitalization and ongoing recovery are:: to return home   Choice offered to / list presented to : NA  Expected Discharge Plan and Services Expected Discharge Plan: [place] In-house Referral: Clinical Social Work Discharge Planning Services: CM Consult Post Acute Care Choice: Home Health, Resumption of Svcs/PTA Provider (Currently active with [employer]) Living  arrangements for the past 2 months: Single Family Home                           HH Arranged: RN, Disease Management, OT, PT, Social Work [employer] Agency: [place] ([place]) Date [place]: 04/24/21 Time [place]: 1518 Representative spoke with at [place]: Corene Cornea  Prior Living Arrangements/Services Living arrangements for the past 2 months: [place] with:: Self, Spouse (Has several aides in the home with various schedules.) Patient language and need for interpreter reviewed:: Yes Do you feel safe going back to the place where you live?: Yes      Need for Family Participation in Patient Care: Yes (Comment) Care giver support system in place?: Yes (comment) Current home services: Home OT, Home PT, Home RN, Actuary, DME (Has DME: Hospital bed, Wheelchair.) Criminal Activity/Legal Involvement Pertinent to Current Situation/Hospitalization: No - Comment as needed  Activities of Daily Living Home Assistive Devices/Equipment: Other (Comment) (UTA due to altered of conciousness) ADL Screening (condition at time of admission) Patient's cognitive ability adequate to safely complete daily activities?: No Is the patient deaf or have difficulty hearing?: Yes Does the patient have difficulty seeing, even when wearing glasses/contacts?: Yes Does the patient have difficulty concentrating, remembering, or making decisions?: Yes Patient able to express need for assistance with ADLs?: No Does the patient have difficulty dressing or bathing?: Yes Independently performs ADLs?: No Communication: Dependent Is this a change from baseline?: Change from baseline, expected  to last >3 days Dressing (OT): Dependent Is this a change from baseline?: Change from baseline, expected to last >3 days Grooming: Dependent Is this a change from baseline?: Change from baseline, expected to last >3 days Feeding: Dependent Is this a change from baseline?: Change from  baseline, expected to last >3 days Bathing: Dependent Is this a change from baseline?: Pre-admission baseline Toileting: Dependent Is this a change from baseline?: Pre-admission baseline In/Out Bed: Dependent Is this a change from baseline?: Pre-admission baseline Walks in Home: Dependent Is this a change from baseline?: Pre-admission baseline Does the patient have difficulty walking or climbing stairs?: Yes Weakness of Legs: Both Weakness of Arms/Hands: Both  Permission Sought/Granted Permission sought to share information with : Family Supports, Chartered certified accountant granted to share information with : Yes, Verbal Permission Granted     Permission granted to share info w AGENCY: [place], [employer]-called regarding bed riser        Emotional Assessment Appearance:: Appears stated age       Alcohol / Substance Use: Not Applicable Psych Involvement: No (comment)  Admission diagnosis:  Sepsis ([place]) [A41.9] Patient Active Problem List   Diagnosis Date Noted   Protein-calorie malnutrition, severe 04/21/2021   Osteomyelitis of thoracic region [facility])    Sternal wound infection 05/10/2021   SDH (subdural hematoma) 04/27/2021   Pneumonia involving right lung 04/19/2021   Delirium 05/11/2021   DNR (do not resuscitate) 05/12/2021   Goals of care, counseling/discussion    Atrial fibrillation with RVR ([place]) 04/17/2021   DDD (degenerative disc disease), lumbar 03/03/2021   Cellulitis 02/18/2021   Lumbar compression fracture ([place]) 02/18/2021   Compression fracture of first lumbar vertebra ([place]) 02/17/2021   Cellulitis and abscess of foot    MRSA bacteremia 08/10/2020   Sepsis due to methicillin resistant Staphylococcus aureus (MRSA) ([place]) 04/24/2020   Cellulitis of knee, left 04/22/2020   Hypokalemia 04/22/2020   Osteoarthritis of multiple joints 12/15/2017   Hyponatremia 03/28/2017   Abnormality of gait and mobility 01/11/2017   Cloudy vision  01/11/2017   Foot drop, bilateral 01/11/2017   Amputee, great toe, left ([place]) 01/11/2017   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   Macrocytic anemia 09/22/2015   Moderate tricuspid insufficiency 04/21/2015   History of prostate cancer 04/16/2015   Idiopathic peripheral neuropathy 04/16/2015   Essential hypertension 04/16/2015   GERD (gastroesophageal reflux disease) 04/16/2015   Arthritis 04/16/2015   Bilateral lower extremity edema 04/16/2015   Benign essential tremor 10/30/2014   Hyperlipidemia, mixed 03/19/1218   Chronic systolic CHF (congestive heart failure), NYHA class 2 ([place]) 04/23/2014   Mitral insufficiency 11/05/2013   CAD (coronary artery disease) 11/01/2013   Basal cell carcinoma of skin of other parts of face 03/14/2013   Ankle sprain 09/09/2011   History of fibula fracture 09/09/2011   PCP:  [name], DO Pharmacy:   [id number] [place], [place] - [address] [place] [address] Phone: [phone] Fax: [phone]  [facility]) - [place], [place] [facility] [address] [place] [address] [place] [address] Phone: [phone] Fax: [phone]   Readmission Risk Interventions No flowsheet data found.

## 2021-04-24 NOTE — Progress Notes (Addendum)
[address]       [place], [address]             [phone]      2 Days Post-Op Procedure(s) (LRB): STERNAL WOUND WASHOUT (N/A) WOUND VAC CHANGE (N/A) Subjective: Pleasantly confused  Objective: Vital signs in last 24 hours: Temp:  [97.4 F (36.3 C)-98.6 F (37 C)] 98.2 F (36.8 C) (11/12 0409) Pulse Rate:  [88-99] 99 (11/12 0409) Cardiac Rhythm: Normal sinus rhythm (11/12 0211) Resp:  [16-18] 16 (11/12 0409) BP: (129-158)/(73-89) 157/88 (11/12 0409) SpO2:  [99 %-100 %] 99 % (11/12 0409) Weight:  [93.1 kg] 93.1 kg (11/12 0409)  Hemodynamic parameters for last 24 hours:    Intake/Output from previous day: 11/11 0701 - 11/12 0700 In: 10 [I.V.:10] Out: 1900 [Urine:1900] Intake/Output this shift: No intake/output data recorded.  Wound: Vac in place, no surrounding cellulitis  Lab Results: Recent Labs    04/23/21 0614 04/24/21 0236  WBC 14.3* 14.4*  HGB 8.5* 9.5*  HCT 25.4* 28.7*  PLT 601* 540*   BMET:  Recent Labs    04/23/21 0614 04/24/21 0236  NA 138 139  K 3.3* 3.8  CL 108 108  CO2 24 24  GLUCOSE 146* 142*  BUN 17 18  CREATININE 0.70 0.74  CALCIUM 8.9 8.9    PT/INR: No results for input(s): LABPROT, INR in the last 72 hours. ABG    Component Value Date/Time   PHART 7.443 04/19/2021 1854   HCO3 19.9 (L) 04/19/2021 1854   ACIDBASEDEF 3.4 (H) 04/19/2021 1854   O2SAT 95.6 04/19/2021 1854   CBG (last 3)  Recent Labs    04/23/21 1948 04/23/21 2357 04/24/21 0413  GLUCAP 102* 122* 135*    Meds Scheduled Meds:  acetaminophen  1,000 mg Per Tube Q6H   azelastine  1 spray Each Nare BID   chlorhexidine  15 mL Mouth Rinse BID   Chlorhexidine Gluconate Cloth  6 each Topical Daily   docusate  100 mg Per Tube BID   feeding supplement (PROSource TF)  45 mL Per Tube BID   mouth rinse  15 mL Mouth Rinse q12n4p   sodium chloride flush  3 mL Intravenous Q12H   Continuous Infusions:  0.45 % NaCl with KCl 20 mEq / L 50 mL/hr  at 04/24/21 0502   diltiazem (CARDIZEM) infusion 5 mg/hr (04/23/21 1941)   feeding supplement (JEVITY 1.5 CAL/FIBER) 1,000 mL (04/23/21 1707)   vancomycin 1,250 mg (04/23/21 0825)   PRN Meds:.acetaminophen **OR** acetaminophen, bisacodyl, haloperidol lactate, morphine injection, ondansetron **OR** ondansetron (ZOFRAN) IV, polyethylene glycol, traMADol  Xrays No results found.  Recent Results (from the past 240 hour(s))  Resp Panel by RT-PCR (Flu A&B, Covid) Nasopharyngeal Swab     Status: None   Collection Time: 04/17/21  8:56 AM   Specimen: Nasopharyngeal Swab; Nasopharyngeal(NP) swabs in vial transport medium  Result Value Ref Range Status   SARS Coronavirus 2 by RT PCR NEGATIVE NEGATIVE Final    Comment: (NOTE) SARS-CoV-2 target nucleic acids are NOT DETECTED.  The SARS-CoV-2 RNA is generally detectable in upper respiratory specimens during the acute phase of infection. The lowest concentration of SARS-CoV-2 viral copies this assay can detect is 138 copies/mL. A negative result does not preclude SARS-Cov-2 infection and should not be used as the sole basis for treatment or other patient management decisions. A negative result may occur with  improper specimen collection/handling, submission of specimen other than nasopharyngeal swab, presence of viral  mutation(s) within the areas targeted by this assay, and inadequate number of viral copies(<138 copies/mL). A negative result must be combined with clinical observations, patient history, and epidemiological information. The expected result is Negative.  Fact Sheet for Patients:  EntrepreneurPulse.com.au  Fact Sheet for Healthcare Providers:  IncredibleEmployment.be  This test is no t yet approved or cleared by the [place] FDA and  has been authorized for detection and/or diagnosis of SARS-CoV-2 by FDA under an Emergency Use Authorization (EUA). This EUA will remain  in effect (meaning  this test can be used) for the duration of the COVID-19 declaration under Section 564(b)(1) of the Act, 21 U.S.C.section 360bbb-3(b)(1), unless the authorization is terminated  or revoked sooner.       Influenza A by PCR NEGATIVE NEGATIVE Final   Influenza B by PCR NEGATIVE NEGATIVE Final    Comment: (NOTE) The Xpert Xpress SARS-CoV-2/FLU/RSV plus assay is intended as an aid in the diagnosis of influenza from Nasopharyngeal swab specimens and should not be used as a sole basis for treatment. Nasal washings and aspirates are unacceptable for Xpert Xpress SARS-CoV-2/FLU/RSV testing.  Fact Sheet for Patients: EntrepreneurPulse.com.au  Fact Sheet for Healthcare Providers: IncredibleEmployment.be  This test is not yet approved or cleared by the [place] FDA and has been authorized for detection and/or diagnosis of SARS-CoV-2 by FDA under an Emergency Use Authorization (EUA). This EUA will remain in effect (meaning this test can be used) for the duration of the COVID-19 declaration under Section 564(b)(1) of the Act, 21 U.S.C. section 360bbb-3(b)(1), unless the authorization is terminated or revoked.  Performed at [facility], [address] [place], [place] [address]   Culture, blood (Routine x 2)     Status: Abnormal   Collection Time: 04/17/21  9:20 AM   Specimen: BLOOD  Result Value Ref Range Status   Specimen Description   Final    BLOOD RIGHT FA Performed at [facility], [address], [place], [place] [address]    Special Requests   Final    BOTTLES DRAWN AEROBIC AND ANAEROBIC Blood Culture adequate volume Performed at [facility], [address] [place], [place] [address]    Culture  Setup Time   Final    GRAM POSITIVE COCCI IN BOTH AEROBIC AND ANAEROBIC BOTTLES Organism ID to follow CRITICAL RESULT CALLED TO, READ BACK BY AND VERIFIED WITH: SAM RAUER 2203 04/17/21 LFD Performed at [facility], [address] [place], [place] [address]    Culture (A)  Final    STAPHYLOCOCCUS AUREUS SUSCEPTIBILITIES PERFORMED ON PREVIOUS CULTURE WITHIN THE LAST 5 DAYS. Performed at [facility], [place] [address], [place], [place] [address]    Report Status 04/30/2021 FINAL  Final  Blood Culture ID Panel (Reflexed)     Status: Abnormal   Collection Time: 04/17/21  9:20 AM  Result Value Ref Range Status   Enterococcus faecalis NOT DETECTED NOT DETECTED Final   Enterococcus Faecium NOT DETECTED NOT DETECTED Final   Listeria monocytogenes NOT DETECTED NOT DETECTED Final   Staphylococcus species DETECTED (A) NOT DETECTED Final    Comment: CRITICAL RESULT CALLED TO, READ BACK BY AND VERIFIED WITH: SAM RAUER 2203 04/17/21 LFD    Staphylococcus aureus (BCID) DETECTED (A) NOT DETECTED Final    Comment: Methicillin (oxacillin)-resistant Staphylococcus aureus (MRSA). MRSA is predictably resistant to beta-lactam antibiotics (except ceftaroline). Preferred therapy is vancomycin unless clinically contraindicated. Patient requires contact precautions if  hospitalized. CRITICAL RESULT CALLED TO, READ  BACK BY AND VERIFIED WITH: SAM RAUER 2203 04/17/21 LFD    Staphylococcus epidermidis NOT DETECTED NOT DETECTED Final   Staphylococcus lugdunensis NOT DETECTED NOT DETECTED Final   Streptococcus species NOT DETECTED NOT DETECTED Final   Streptococcus agalactiae NOT DETECTED NOT DETECTED Final   Streptococcus pneumoniae NOT DETECTED NOT DETECTED Final   Streptococcus pyogenes NOT DETECTED NOT DETECTED Final   A.calcoaceticus-baumannii NOT DETECTED NOT DETECTED Final   Bacteroides fragilis NOT DETECTED NOT DETECTED Final   Enterobacterales NOT DETECTED NOT DETECTED Final   Enterobacter cloacae complex NOT DETECTED NOT DETECTED Final   Escherichia coli NOT DETECTED NOT DETECTED Final   Klebsiella aerogenes NOT DETECTED NOT DETECTED Final   Klebsiella oxytoca NOT DETECTED NOT DETECTED Final    Klebsiella pneumoniae NOT DETECTED NOT DETECTED Final   Proteus species NOT DETECTED NOT DETECTED Final   Salmonella species NOT DETECTED NOT DETECTED Final   Serratia marcescens NOT DETECTED NOT DETECTED Final   Haemophilus influenzae NOT DETECTED NOT DETECTED Final   Neisseria meningitidis NOT DETECTED NOT DETECTED Final   Pseudomonas aeruginosa NOT DETECTED NOT DETECTED Final   Stenotrophomonas maltophilia NOT DETECTED NOT DETECTED Final   Candida albicans NOT DETECTED NOT DETECTED Final   Candida auris NOT DETECTED NOT DETECTED Final   Candida glabrata NOT DETECTED NOT DETECTED Final   Candida krusei NOT DETECTED NOT DETECTED Final   Candida parapsilosis NOT DETECTED NOT DETECTED Final   Candida tropicalis NOT DETECTED NOT DETECTED Final   Cryptococcus neoformans/gattii NOT DETECTED NOT DETECTED Final   Meth resistant mecA/C and MREJ DETECTED (A) NOT DETECTED Final    Comment: CRITICAL RESULT CALLED TO, READ BACK BY AND VERIFIED WITH: SAM RAUER 2203 04/17/21 LFD Performed at [facility], [address] [place], [place] [address]   Culture, blood (Routine x 2)     Status: Abnormal   Collection Time: 04/17/21  9:47 AM   Specimen: BLOOD  Result Value Ref Range Status   Specimen Description   Final    BLOOD RIGHT ARM Performed at [facility], [address] [place], [place] [address]    Special Requests   Final    BOTTLES DRAWN AEROBIC AND ANAEROBIC Blood Culture results may not be optimal due to an excessive volume of blood received in culture bottles Performed at [facility], [address] [place], [place] [address]    Culture  Setup Time   Final    GRAM POSITIVE COCCI IN BOTH AEROBIC AND ANAEROBIC BOTTLES CRITICAL VALUE NOTED.  VALUE IS CONSISTENT WITH PREVIOUSLY REPORTED AND CALLED VALUE. Performed at [facility], [address] [place], [place] [address]    Culture METHICILLIN RESISTANT STAPHYLOCOCCUS AUREUS (A)  Final    Report Status 05/07/2021 FINAL  Final   Organism ID, Bacteria METHICILLIN RESISTANT STAPHYLOCOCCUS AUREUS  Final      Susceptibility   Methicillin resistant staphylococcus aureus - MIC*    CIPROFLOXACIN >=8 RESISTANT Resistant     ERYTHROMYCIN <=0.25 SENSITIVE Sensitive     GENTAMICIN <=0.5 SENSITIVE Sensitive     OXACILLIN >=4 RESISTANT Resistant     TETRACYCLINE <=1 SENSITIVE Sensitive     VANCOMYCIN <=0.5 SENSITIVE Sensitive     TRIMETH/SULFA <=10 SENSITIVE Sensitive     CLINDAMYCIN <=0.25 SENSITIVE Sensitive     RIFAMPIN <=0.5 SENSITIVE Sensitive     Inducible Clindamycin NEGATIVE Sensitive     * METHICILLIN RESISTANT STAPHYLOCOCCUS AUREUS  Urine Culture     Status: Abnormal   Collection  Time: 04/17/21 11:58 AM   Specimen: Urine, Random  Result Value Ref Range Status   Specimen Description   Final    URINE, RANDOM Performed at [facility], [address] [place], [place] [address]    Special Requests   Final    NONE Performed at [facility], [address] [place], [place] [address]    Culture MULTIPLE SPECIES PRESENT, SUGGEST RECOLLECTION (A)  Final   Report Status 04/19/2021 FINAL  Final  Culture, blood (routine x 2)     Status: None (Preliminary result)   Collection Time: 04/19/21  7:55 AM   Specimen: BLOOD LEFT WRIST  Result Value Ref Range Status   Specimen Description BLOOD LEFT WRIST  Final   Special Requests   Final    BOTTLES DRAWN AEROBIC AND ANAEROBIC Blood Culture adequate volume   Culture   Final    NO GROWTH 4 DAYS Performed at [facility], [place] [address], [place], [place] [address]    Report Status PENDING  Incomplete  Culture, blood (routine x 2)     Status: None (Preliminary result)   Collection Time: 04/19/21  8:02 AM   Specimen: BLOOD LEFT HAND  Result Value Ref Range Status   Specimen Description BLOOD LEFT HAND  Final   Special Requests   Final    BOTTLES DRAWN AEROBIC AND ANAEROBIC Blood Culture adequate volume    Culture   Final    NO GROWTH 4 DAYS Performed at [facility], [place] [address] [place], [place] [address]    Report Status PENDING  Incomplete  MRSA Next Gen by PCR, Nasal     Status: Abnormal   Collection Time: 04/19/21 11:13 AM   Specimen: Nasal Mucosa; Nasal Swab  Result Value Ref Range Status   MRSA by PCR Next Gen DETECTED (A) NOT DETECTED Final    Comment: RESULT CALLED TO, READ BACK BY AND VERIFIED WITH: D WHITE RN 1503 04/19/21 A BROWNING (NOTE) The GeneXpert MRSA Assay (FDA approved for NASAL specimens only), is one component of a comprehensive MRSA colonization surveillance program. It is not intended to diagnose MRSA infection nor to guide or monitor treatment for MRSA infections. Test performance is not FDA approved in patients less than 9 years old. Performed at [facility], [place] [address] [place], [place] [address]   Aerobic/Anaerobic Culture w Gram Stain (surgical/deep wound)     Status: None (Preliminary result)   Collection Time: 04/14/2021  8:23 AM   Specimen: Wound; Body Fluid  Result Value Ref Range Status   Specimen Description WOUND  Final   Special Requests  STERNAL WOUND  Final   Gram Stain   Final    FEW WBC PRESENT,BOTH PMN AND MONONUCLEAR FEW GRAM POSITIVE COCCI IN PAIRS Performed at [facility], [address] [place], [place] [address]    Culture   Final    MODERATE METHICILLIN RESISTANT STAPHYLOCOCCUS AUREUS NO ANAEROBES ISOLATED; CULTURE IN PROGRESS FOR 5 DAYS    Report Status PENDING  Incomplete   Organism ID, Bacteria METHICILLIN RESISTANT STAPHYLOCOCCUS AUREUS  Final      Susceptibility   Methicillin resistant staphylococcus aureus - MIC*    CIPROFLOXACIN >=8 RESISTANT Resistant     ERYTHROMYCIN <=0.25 SENSITIVE Sensitive     GENTAMICIN <=0.5 SENSITIVE Sensitive     OXACILLIN >=4 RESISTANT Resistant     TETRACYCLINE <=1 SENSITIVE Sensitive     VANCOMYCIN 1 SENSITIVE Sensitive  TRIMETH/SULFA <=10 SENSITIVE Sensitive      CLINDAMYCIN <=0.25 SENSITIVE Sensitive     RIFAMPIN <=0.5 SENSITIVE Sensitive     Inducible Clindamycin NEGATIVE Sensitive     * MODERATE METHICILLIN RESISTANT STAPHYLOCOCCUS AUREUS  Aerobic/Anaerobic Culture w Gram Stain (surgical/deep wound)     Status: None (Preliminary result)   Collection Time: 05/11/2021  8:27 AM   Specimen: PATH Bone biopsy; Tissue  Result Value Ref Range Status   Specimen Description TISSUE  Final   Special Requests  STERNUM  Final   Gram Stain   Final    FEW WBC PRESENT,BOTH PMN AND MONONUCLEAR FEW GRAM POSITIVE COCCI IN PAIRS Performed at [facility], [address], [place], [place] [address]    Culture   Final    MODERATE METHICILLIN RESISTANT STAPHYLOCOCCUS AUREUS NO ANAEROBES ISOLATED; CULTURE IN PROGRESS FOR 5 DAYS    Report Status PENDING  Incomplete   Organism ID, Bacteria METHICILLIN RESISTANT STAPHYLOCOCCUS AUREUS  Final      Susceptibility   Methicillin resistant staphylococcus aureus - MIC*    CIPROFLOXACIN >=8 RESISTANT Resistant     ERYTHROMYCIN <=0.25 SENSITIVE Sensitive     GENTAMICIN <=0.5 SENSITIVE Sensitive     OXACILLIN >=4 RESISTANT Resistant     TETRACYCLINE <=1 SENSITIVE Sensitive     VANCOMYCIN 1 SENSITIVE Sensitive     TRIMETH/SULFA <=10 SENSITIVE Sensitive     CLINDAMYCIN <=0.25 SENSITIVE Sensitive     RIFAMPIN <=0.5 SENSITIVE Sensitive     Inducible Clindamycin NEGATIVE Sensitive     * MODERATE METHICILLIN RESISTANT STAPHYLOCOCCUS AUREUS       Assessment/Plan: S/P Procedure(s) (LRB): STERNAL WOUND WASHOUT (N/A) WOUND VAC CHANGE (N/A)  1 afeb, VSS s BP 120's-150's 2 sats good on 1 liter 3 leukocytosis - stable, conts current abx, VAC change next week 4 normal renal fxn 5 BS control ok 6 medical management per primary/ID/cards/palliative    LOS: 6 days    John Giovanni PA-C Pager [phone]    04/24/2021     Chart reviewed, patient examined, agree with above. VAC in place and functioning.

## 2021-04-24 NOTE — Progress Notes (Signed)
Patient awake and alert this AM. Conversation nonsensical but follows commands appropriately. Oral care provided. Patient denies pain or shortness or breath. Remains in bilateral soft wrist restraints with no acute distress noted.

## 2021-04-24 NOTE — Progress Notes (Signed)
Daily Progress Note   Patient Name: [name]       Date: 04/24/2021 DOB: 27-May-1936  Age: 85 y.o. MRN#: [id number] Attending Physician: Bonnielee Haff, MD Primary Care Physician: [name], DO Admit Date: 04/14/2021  Reason for Consultation/Follow-up: Establishing goals of care  Subjective: Does not open eyes but does answer questions - tells me he wants to get home to take his vitamins  Length of Stay: 6  Current Medications: Scheduled Meds:   acetaminophen  1,000 mg Per Tube Q6H   azelastine  1 spray Each Nare BID   chlorhexidine  15 mL Mouth Rinse BID   Chlorhexidine Gluconate Cloth  6 each Topical Daily   diltiazem  30 mg Per Tube Q6H   docusate  100 mg Per Tube BID   feeding supplement (PROSource TF)  45 mL Per Tube BID   mouth rinse  15 mL Mouth Rinse q12n4p   sodium chloride flush  3 mL Intravenous Q12H    Continuous Infusions:  0.45 % NaCl with KCl 20 mEq / L 50 mL/hr at 04/24/21 1400   feeding supplement (JEVITY 1.5 CAL/FIBER) 1,000 mL (04/24/21 1349)   vancomycin Stopped (04/24/21 1054)    PRN Meds: acetaminophen **OR** acetaminophen, bisacodyl, haloperidol lactate, morphine injection, ondansetron **OR** ondansetron (ZOFRAN) IV, polyethylene glycol, traMADol  Physical Exam Constitutional:      General: He is not in acute distress.    Comments: Does not open eyes but responds verbally  Pulmonary:     Effort: Pulmonary effort is normal.  Abdominal:     Comments: Cortrak in place  Skin:    General: Skin is warm and dry.  Neurological:     Mental Status: He is disoriented.            Vital Signs: BP (!) 162/87 (BP Location: Left Arm)   Pulse 98   Temp 98.7 F (37.1 C) (Oral)   Resp 20   Ht 6' 0.01" (1.829 m)   Wt 93.1 kg   SpO2 95%   BMI  27.83 kg/m  SpO2: SpO2: 95 % O2 Device: O2 Device: Nasal Cannula O2 Flow Rate: O2 Flow Rate (L/min): 1 L/min  Intake/output summary:  Intake/Output Summary (Last 24 hours) at 04/24/2021 1436 Last data filed at 04/24/2021 1400 Gross per 24 hour  Intake 2142.36 ml  Output 1850 ml  Net 292.36 ml    LBM:   Baseline Weight: Weight: 89.4 kg Most recent weight: Weight: 93.1 kg       Palliative Assessment/Data: PPS 30% on tube feeds - 10% without    Flowsheet Rows    Flowsheet Row Most Recent Value  Intake Tab   Referral Department --  [CT surgery]  Unit at Time of Referral Intermediate Care Unit  Palliative Care Primary Diagnosis Sepsis/Infectious Disease  Date Notified 05/01/2021  Palliative Care Type New Palliative care  Reason for referral Clarify Goals of Care  Date of Admission 04/21/2021  Date first seen by Palliative Care 04/19/2021  # of days Palliative referral response time 0 Day(s)  # of days IP prior to Palliative referral 0  Clinical Assessment   Psychosocial & Spiritual Assessment   Palliative Care Outcomes  Patient Active Problem List   Diagnosis Date Noted   Protein-calorie malnutrition, severe 04/21/2021   Osteomyelitis of thoracic region [facility])    Sternal wound infection 04/27/2021   SDH (subdural hematoma) 04/15/2021   Pneumonia involving right lung 05/02/2021   Delirium 05/12/2021   DNR (do not resuscitate) 05/08/2021   Goals of care, counseling/discussion    Atrial fibrillation with RVR ([place]) 04/17/2021   DDD (degenerative disc disease), lumbar 03/03/2021   Cellulitis 02/18/2021   Lumbar compression fracture ([place]) 02/18/2021   Compression fracture of first lumbar vertebra ([place]) 02/17/2021   Cellulitis and abscess of foot    MRSA bacteremia 08/10/2020   Sepsis due to methicillin resistant Staphylococcus aureus (MRSA) ([place]) 04/24/2020   Cellulitis of knee, left 04/22/2020   Hypokalemia 04/22/2020   Osteoarthritis of multiple joints  12/15/2017   Hyponatremia 03/28/2017   Abnormality of gait and mobility 01/11/2017   Cloudy vision 01/11/2017   Foot drop, bilateral 01/11/2017   Amputee, great toe, left ([place]) 01/11/2017   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   Macrocytic anemia 09/22/2015   Moderate tricuspid insufficiency 04/21/2015   History of prostate cancer 04/16/2015   Idiopathic peripheral neuropathy 04/16/2015   Essential hypertension 04/16/2015   GERD (gastroesophageal reflux disease) 04/16/2015   Arthritis 04/16/2015   Bilateral lower extremity edema 04/16/2015   Benign essential tremor 10/30/2014   Hyperlipidemia, mixed 63/14/9702   Chronic systolic CHF (congestive heart failure), NYHA class 2 ([place]) 04/23/2014   Mitral insufficiency 11/05/2013   CAD (coronary artery disease) 11/01/2013   Basal cell carcinoma of skin of other parts of face 03/14/2013   Ankle sprain 09/09/2011   History of fibula fracture 09/09/2011    Palliative Care Assessment & Plan   HPI: 85 y.o. male with past medical history of HTN; HLD; remote prostate CA; CAD s/p remote CABG; dementia, recurrent falls, and abdominal wall melanoma admitted on 05/07/2021 with sepsis from [facility] - new onset fever, respiratory distress after discharging from from [facility] yesterday.   Patient admitted to [facility] from 10/29-11/4 after presenting with a fall with LOC.  He was found to have an acute SDH. At [facility] ED, ED scan showed mediastinum fluid collection with gas, with a transverse fracture of sternum. PMT has been consulted to assist with goals of care conversation.   Assessment: Some improvement in mental status today though still lethargic.  Wife and caregiver at bedside. Agreeable to speaking outside of patient's room. Wife has told other members of medical team she very much wants to take patient home.  During our meeting she shares the same with me - shares that she plans to take the patient home on Monday. We discuss difficulties  of this:  - he remains on tube feeds - she is hopeful he can be reevaluated now that he is slightly more alert and that feeding tube can be removed - he has a wound vac - she asks if it can be discontinued or cared for at home with home health nurse - he remains on IV antibiotics - again she asks if these can be continued at home with home health care - he has TEE scheduled Monday - she does not feel this is necessary - we discuss that the TEE would only give [place] more information but not change his care as he already requires long term antibiotics and is not a surgical candidate  She tells me she has a care team at home with privately paid caregivers to help care  for Mr. Burcher. She tells me they have a full staff next week and that is why she is insistent on bringing him home Monday.   She feels strongly that Mr. Edler will only improve if he comes home - she does not feel that he will improve in the unfamiliar setting of the hospital.   I share with her that it is true that the patient likely responds better to family/caregivers he is familiar with I do worry that he needs higher level of care than what can be provided at home.   I ask wife to consider how she would approach Mr. Stefanski care if he does not do well at home and she tells me her plan would be to call 911 and have him return to the hospital.  Patient's wife is not interested in a shift in care to lean away from aggressive medical interventions and focus more on comfort. She would like to continue aggressive medical care. She tells me her main goal for the patient is for him to live.   Recommendations/Plan: Wife wants to take patient home Monday - difficulties/limitations of this plan were discussed in detail as above however wife continues to request a discharge home Monday as she feels this is patient's "only chance" to improve - her requests were communicated with care team Wife NOT interested in TEE - please cancel SLP to reevaluate  11/13 - wife hopeful cortrak can be removed Wife wondering if wound vac can be dc'd or cared for at home by home health Wife requesting IV antibiotics at home Wife continues to be interested in medical care offered to prolong life - not interested in considering comfort focused care/hospice support at home Will request my coworker Josseline Burt Knack follow up with wife Monday  Code Status: DNR  Care plan was discussed with RN and wife, Dr. [place] Pink, Tammy SLP, [facility] team  Thank you for allowing the Palliative Medicine Team to assist in the care of this patient.   Total Time 45 minutes Prolonged Time Billed  no       Greater than 50%  of this time was spent counseling and coordinating care related to the above assessment and plan.  Juel Burrow, DNP, [facility] Palliative Medicine Team Team Phone # [phone]  Pager [phone]

## 2021-04-25 ENCOUNTER — Inpatient Hospital Stay: Payer: Self-pay

## 2021-04-25 DIAGNOSIS — S21101A Unspecified open wound of right front wall of thorax without penetration into thoracic cavity, initial encounter: Secondary | ICD-10-CM | POA: Diagnosis not present

## 2021-04-25 DIAGNOSIS — S065XAA Traumatic subdural hemorrhage with loss of consciousness status unknown, initial encounter: Secondary | ICD-10-CM | POA: Diagnosis not present

## 2021-04-25 DIAGNOSIS — I4891 Unspecified atrial fibrillation: Secondary | ICD-10-CM | POA: Diagnosis not present

## 2021-04-25 DIAGNOSIS — R410 Disorientation, unspecified: Secondary | ICD-10-CM | POA: Diagnosis not present

## 2021-04-25 LAB — CBC
HCT: 26.5 % — ABNORMAL LOW (ref 39.0–52.0)
Hemoglobin: 8.6 g/dL — ABNORMAL LOW (ref 13.0–17.0)
MCH: 34 pg (ref 26.0–34.0)
MCHC: 32.5 g/dL (ref 30.0–36.0)
MCV: 104.7 fL — ABNORMAL HIGH (ref 80.0–100.0)
Platelets: 537 10*3/uL — ABNORMAL HIGH (ref 150–400)
RBC: 2.53 MIL/uL — ABNORMAL LOW (ref 4.22–5.81)
RDW: 15.6 % — ABNORMAL HIGH (ref 11.5–15.5)
WBC: 14.2 10*3/uL — ABNORMAL HIGH (ref 4.0–10.5)
nRBC: 0 % (ref 0.0–0.2)

## 2021-04-25 LAB — AEROBIC/ANAEROBIC CULTURE W GRAM STAIN (SURGICAL/DEEP WOUND)

## 2021-04-25 LAB — BASIC METABOLIC PANEL
Anion gap: 7 (ref 5–15)
BUN: 16 mg/dL (ref 8–23)
CO2: 26 mmol/L (ref 22–32)
Calcium: 8.5 mg/dL — ABNORMAL LOW (ref 8.9–10.3)
Chloride: 105 mmol/L (ref 98–111)
Creatinine, Ser: 0.64 mg/dL (ref 0.61–1.24)
GFR, Estimated: >60 mL/min (ref >60)
Glucose, Bld: 120 mg/dL — ABNORMAL HIGH (ref 70–99)
Potassium: 3.4 mmol/L — ABNORMAL LOW (ref 3.5–5.1)
Sodium: 138 mmol/L (ref 135–145)

## 2021-04-25 LAB — GLUCOSE, CAPILLARY
Glucose-Capillary: 105 mg/dL — ABNORMAL HIGH (ref 70–99)
Glucose-Capillary: 117 mg/dL — ABNORMAL HIGH (ref 70–99)
Glucose-Capillary: 119 mg/dL — ABNORMAL HIGH (ref 70–99)
Glucose-Capillary: 121 mg/dL — ABNORMAL HIGH (ref 70–99)
Glucose-Capillary: 128 mg/dL — ABNORMAL HIGH (ref 70–99)
Glucose-Capillary: 132 mg/dL — ABNORMAL HIGH (ref 70–99)

## 2021-04-25 MED ORDER — POTASSIUM CHLORIDE 20 MEQ PO PACK
40.0000 meq | PACK | Freq: Once | ORAL | Status: AC
  Administered 2021-04-25: 40 meq
  Filled 2021-04-25: qty 2

## 2021-04-25 NOTE — Progress Notes (Signed)
Diagnosis: Mrsa bacteremia Sternal fx with phlegmon/OM s/p I&D 11/08 - cx mrsa Negative tte -- refused tee  Culture Result: 11/07 repeat bcx negative; 11/05 bcx mrsa   -would treat at least 6 weeks; on vancomtycin now, but potentially could benefit from daptomycin  -will leave on vanc now with opat setup for that -id f/u setup as well  -tomorrow new id team/pharmacy team will be available and see if he can have daptomycin  OPAT Orders Discharge antibiotics to be given via PICC line Discharge antibiotics: vanc Per pharmacy protocol; Aim for Vancomycin trough 10-15 or AUC 400-550 (unless otherwise indicated)  Duration: 6 weeks End Date: 12/20  [facility] Care Per Protocol:  Home health RN for IV administration and teaching; PICC line care and labs.    Labs weekly while on IV antibiotics: _x_ CBC with differential __ BMP _x_ CMP _x_ CRP __ ESR _x_ Vancomycin trough __ CK  __ Please pull PIC at completion of IV antibiotics __ Please leave PIC in place until doctor has seen patient or been notified  Fax weekly labs to [phone]  Clinic Follow Up Appt: Dr West Bali on 11/30 @ 145  @  [facility] [place], [place], [place] [address] Phone: [phone]

## 2021-04-25 NOTE — Progress Notes (Signed)
Speech Language Pathology Treatment: Dysphagia  Patient Details Name: [name] MRN: [id number] DOB: July 03, 1935 Today's Date: 04/25/2021 Time: 2563-8937 SLP Time Calculation (min) (ACUTE ONLY): 14 min  Assessment / Plan / Recommendation Clinical Impression  Pt was seen for dysphagia treatment. He was more alert during this session and he communicated verbally. Pt continues to demonstrate reduced bolus awareness, but this was improved compared to that noted during the initial evaluation. Mastication and bolus manipulation were prolonged with intermittent oral holding. Spitting behaviors and/or tight pursing of lips were inconsistently noted when boluses when presented, and encouragement was needed for pt to accept multiple boluses. No s/sx of aspiration were noted with solids or liquids. A dysphagia 1 diet with thin liquids will be initiated at this time. However, encouragement will likely be needed for p.o. intake. Pt's dysphagia appears cognitively based; SLP will follow pt to assess improvement in swallow function and to ensure tolerance.    HPI HPI: Pt is an 85 yo male who presented 11/5 with AMS after being d/c home on 11/4 from [facility] where he was admitted for fall with LOC and SDH. Chest CT revealed large mediastinum fluid collection with gas, with a transverse fx of sternum. Pt s/p sternal I&D and wound vac placement on 11/8 and repeat debridement on 11/10. Cortrak placed 11/9. PMT meeting 11/12: pt's wife would like to continue aggressive medical care and wants him d/c on 11/14. PMH: frequent falls, polypharmacy, neuropathy, dementia, chronic hyponatremia, HTN.      SLP Plan  Continue with current plan of care      Recommendations for follow up therapy are one component of a multi-disciplinary discharge planning process, led by the attending physician.  Recommendations may be updated based on patient status, additional functional criteria and insurance authorization.     Recommendations  Diet recommendations: Dysphagia 1 (puree);Thin liquid Liquids provided via: Cup;Straw Medication Administration: Via alternative means (or crushed in puree) Supervision: Trained caregiver to feed patient Compensations: Slow rate;Small sips/bites;Minimize environmental distractions Postural Changes and/or Swallow Maneuvers: Seated upright 90 degrees                Oral Care Recommendations: Oral care QID Follow Up Recommendations: Skilled nursing-short term rehab (<3 hours/day) Assistance recommended at discharge: Frequent or constant Supervision/Assistance SLP Visit Diagnosis: Dysphagia, unspecified (R13.10) Plan: Continue with current plan of care       Ezekial Arns I. Hardin Negus, [place], [place] Office number [phone] Pager [place]  04/25/2021, 10:08 AM

## 2021-04-25 NOTE — Progress Notes (Signed)
[address]       [place] [address]             [phone]      3 Days Post-Op Procedure(s) (LRB): STERNAL WOUND WASHOUT (N/A) WOUND VAC CHANGE (N/A) Subjective: Reports no significant pain  Objective: Vital signs in last 24 hours: Temp:  [98 F (36.7 C)-99.2 F (37.3 C)] 98.1 F (36.7 C) (11/13 0352) Pulse Rate:  [75-100] 100 (11/13 0352) Cardiac Rhythm: Sinus tachycardia (11/12 1935) Resp:  [16-20] 18 (11/13 0352) BP: (145-162)/(86-98) 161/91 (11/13 0352) SpO2:  [94 %-100 %] 100 % (11/13 0352)  Hemodynamic parameters for last 24 hours:    Intake/Output from previous day: 11/12 0701 - 11/13 0700 In: 2683.2 [I.V.:2183.2; IV Piggyback:500] Out: 8101 [Urine:1650] Intake/Output this shift: No intake/output data recorded.  Wound: VAC in place , appears stable  Lab Results: Recent Labs    04/24/21 0236 04/25/21 0329  WBC 14.4* 14.2*  HGB 9.5* 8.6*  HCT 28.7* 26.5*  PLT 540* 537*   BMET:  Recent Labs    04/24/21 0236 04/25/21 0329  NA 139 138  K 3.8 3.4*  CL 108 105  CO2 24 26  GLUCOSE 142* 120*  BUN 18 16  CREATININE 0.74 0.64  CALCIUM 8.9 8.5*    PT/INR: No results for input(s): LABPROT, INR in the last 72 hours. ABG    Component Value Date/Time   PHART 7.443 04/19/2021 1854   HCO3 19.9 (L) 04/19/2021 1854   ACIDBASEDEF 3.4 (H) 04/19/2021 1854   O2SAT 95.6 04/19/2021 1854   CBG (last 3)  Recent Labs    04/24/21 2011 04/25/21 0002 04/25/21 0403  GLUCAP 116* 128* 117*    Meds Scheduled Meds:  acetaminophen  1,000 mg Per Tube Q6H   azelastine  1 spray Each Nare BID   chlorhexidine  15 mL Mouth Rinse BID   Chlorhexidine Gluconate Cloth  6 each Topical Daily   diltiazem  30 mg Per Tube Q6H   docusate  100 mg Per Tube BID   feeding supplement (PROSource TF)  45 mL Per Tube BID   mouth rinse  15 mL Mouth Rinse q12n4p   potassium chloride  40 mEq Per Tube Once   sodium chloride flush  3 mL Intravenous Q12H    Continuous Infusions:  0.45 % NaCl with KCl 20 mEq / L 50 mL/hr at 04/25/21 0101   feeding supplement (JEVITY 1.5 CAL/FIBER) 1,000 mL (04/24/21 1349)   vancomycin Stopped (04/24/21 1054)   PRN Meds:.acetaminophen **OR** acetaminophen, bisacodyl, haloperidol lactate, morphine injection, ondansetron **OR** ondansetron (ZOFRAN) IV, polyethylene glycol, traMADol  Xrays No results found. Recent Results (from the past 240 hour(s))  Resp Panel by RT-PCR (Flu A&B, Covid) Nasopharyngeal Swab     Status: None   Collection Time: 04/17/21  8:56 AM   Specimen: Nasopharyngeal Swab; Nasopharyngeal(NP) swabs in vial transport medium  Result Value Ref Range Status   SARS Coronavirus 2 by RT PCR NEGATIVE NEGATIVE Final    Comment: (NOTE) SARS-CoV-2 target nucleic acids are NOT DETECTED.  The SARS-CoV-2 RNA is generally detectable in upper respiratory specimens during the acute phase of infection. The lowest concentration of SARS-CoV-2 viral copies this assay can detect is 138 copies/mL. A negative result does not preclude SARS-Cov-2 infection and should not be used as the sole basis for treatment or other patient management decisions. A negative result may occur with  improper specimen collection/handling, submission of specimen other than nasopharyngeal swab,  presence of viral mutation(s) within the areas targeted by this assay, and inadequate number of viral copies(<138 copies/mL). A negative result must be combined with clinical observations, patient history, and epidemiological information. The expected result is Negative.  Fact Sheet for Patients:  EntrepreneurPulse.com.au  Fact Sheet for Healthcare Providers:  IncredibleEmployment.be  This test is no t yet approved or cleared by the [place] FDA and  has been authorized for detection and/or diagnosis of SARS-CoV-2 by FDA under an Emergency Use Authorization (EUA). This EUA will remain  in effect  (meaning this test can be used) for the duration of the COVID-19 declaration under Section 564(b)(1) of the Act, 21 U.S.C.section 360bbb-3(b)(1), unless the authorization is terminated  or revoked sooner.       Influenza A by PCR NEGATIVE NEGATIVE Final   Influenza B by PCR NEGATIVE NEGATIVE Final    Comment: (NOTE) The Xpert Xpress SARS-CoV-2/FLU/RSV plus assay is intended as an aid in the diagnosis of influenza from Nasopharyngeal swab specimens and should not be used as a sole basis for treatment. Nasal washings and aspirates are unacceptable for Xpert Xpress SARS-CoV-2/FLU/RSV testing.  Fact Sheet for Patients: EntrepreneurPulse.com.au  Fact Sheet for Healthcare Providers: IncredibleEmployment.be  This test is not yet approved or cleared by the [place] FDA and has been authorized for detection and/or diagnosis of SARS-CoV-2 by FDA under an Emergency Use Authorization (EUA). This EUA will remain in effect (meaning this test can be used) for the duration of the COVID-19 declaration under Section 564(b)(1) of the Act, 21 U.S.C. section 360bbb-3(b)(1), unless the authorization is terminated or revoked.  Performed at [facility], [address] [place], [place] [address]   Culture, blood (Routine x 2)     Status: Abnormal   Collection Time: 04/17/21  9:20 AM   Specimen: BLOOD  Result Value Ref Range Status   Specimen Description   Final    BLOOD RIGHT FA Performed at [facility], [address] [place], [place] [address]    Special Requests   Final    BOTTLES DRAWN AEROBIC AND ANAEROBIC Blood Culture adequate volume Performed at [facility], [address] [place], [place] [address]    Culture  Setup Time   Final    GRAM POSITIVE COCCI IN BOTH AEROBIC AND ANAEROBIC BOTTLES Organism ID to follow CRITICAL RESULT CALLED TO, READ BACK BY AND VERIFIED WITH: SAM RAUER 2203 04/17/21 LFD Performed at  [facility], [address] [place], [place] [address]    Culture (A)  Final    STAPHYLOCOCCUS AUREUS SUSCEPTIBILITIES PERFORMED ON PREVIOUS CULTURE WITHIN THE LAST 5 DAYS. Performed at [facility], [place] [address] [place],  [address]    Report Status 04/21/2021 FINAL  Final  Blood Culture ID Panel (Reflexed)     Status: Abnormal   Collection Time: 04/17/21  9:20 AM  Result Value Ref Range Status   Enterococcus faecalis NOT DETECTED NOT DETECTED Final   Enterococcus Faecium NOT DETECTED NOT DETECTED Final   Listeria monocytogenes NOT DETECTED NOT DETECTED Final   Staphylococcus species DETECTED (A) NOT DETECTED Final    Comment: CRITICAL RESULT CALLED TO, READ BACK BY AND VERIFIED WITH: SAM RAUER 2203 04/17/21 LFD    Staphylococcus aureus (BCID) DETECTED (A) NOT DETECTED Final    Comment: Methicillin (oxacillin)-resistant Staphylococcus aureus (MRSA). MRSA is predictably resistant to beta-lactam antibiotics (except ceftaroline). Preferred therapy is vancomycin unless clinically contraindicated. Patient requires contact precautions if  hospitalized. CRITICAL RESULT  CALLED TO, READ BACK BY AND VERIFIED WITH: SAM RAUER 2203 04/17/21 LFD    Staphylococcus epidermidis NOT DETECTED NOT DETECTED Final   Staphylococcus lugdunensis NOT DETECTED NOT DETECTED Final   Streptococcus species NOT DETECTED NOT DETECTED Final   Streptococcus agalactiae NOT DETECTED NOT DETECTED Final   Streptococcus pneumoniae NOT DETECTED NOT DETECTED Final   Streptococcus pyogenes NOT DETECTED NOT DETECTED Final   A.calcoaceticus-baumannii NOT DETECTED NOT DETECTED Final   Bacteroides fragilis NOT DETECTED NOT DETECTED Final   Enterobacterales NOT DETECTED NOT DETECTED Final   Enterobacter cloacae complex NOT DETECTED NOT DETECTED Final   Escherichia coli NOT DETECTED NOT DETECTED Final   Klebsiella aerogenes NOT DETECTED NOT DETECTED Final   Klebsiella oxytoca NOT DETECTED NOT DETECTED  Final   Klebsiella pneumoniae NOT DETECTED NOT DETECTED Final   Proteus species NOT DETECTED NOT DETECTED Final   Salmonella species NOT DETECTED NOT DETECTED Final   Serratia marcescens NOT DETECTED NOT DETECTED Final   Haemophilus influenzae NOT DETECTED NOT DETECTED Final   Neisseria meningitidis NOT DETECTED NOT DETECTED Final   Pseudomonas aeruginosa NOT DETECTED NOT DETECTED Final   Stenotrophomonas maltophilia NOT DETECTED NOT DETECTED Final   Candida albicans NOT DETECTED NOT DETECTED Final   Candida auris NOT DETECTED NOT DETECTED Final   Candida glabrata NOT DETECTED NOT DETECTED Final   Candida krusei NOT DETECTED NOT DETECTED Final   Candida parapsilosis NOT DETECTED NOT DETECTED Final   Candida tropicalis NOT DETECTED NOT DETECTED Final   Cryptococcus neoformans/gattii NOT DETECTED NOT DETECTED Final   Meth resistant mecA/C and MREJ DETECTED (A) NOT DETECTED Final    Comment: CRITICAL RESULT CALLED TO, READ BACK BY AND VERIFIED WITH: SAM RAUER 2203 04/17/21 LFD Performed at [facility], [address] [place], [place] [address]   Culture, blood (Routine x 2)     Status: Abnormal   Collection Time: 04/17/21  9:47 AM   Specimen: BLOOD  Result Value Ref Range Status   Specimen Description   Final    BLOOD RIGHT ARM Performed at [facility], [address] [place], [place] [address]    Special Requests   Final    BOTTLES DRAWN AEROBIC AND ANAEROBIC Blood Culture results may not be optimal due to an excessive volume of blood received in culture bottles Performed at [facility], [address] [name], [place] [address]    Culture  Setup Time   Final    GRAM POSITIVE COCCI IN BOTH AEROBIC AND ANAEROBIC BOTTLES CRITICAL VALUE NOTED.  VALUE IS CONSISTENT WITH PREVIOUSLY REPORTED AND CALLED VALUE. Performed at [facility], [address] [place],  [address]    Culture METHICILLIN RESISTANT STAPHYLOCOCCUS AUREUS (A)   Final   Report Status 04/13/2021 FINAL  Final   Organism ID, Bacteria METHICILLIN RESISTANT STAPHYLOCOCCUS AUREUS  Final      Susceptibility   Methicillin resistant staphylococcus aureus - MIC*    CIPROFLOXACIN >=8 RESISTANT Resistant     ERYTHROMYCIN <=0.25 SENSITIVE Sensitive     GENTAMICIN <=0.5 SENSITIVE Sensitive     OXACILLIN >=4 RESISTANT Resistant     TETRACYCLINE <=1 SENSITIVE Sensitive     VANCOMYCIN <=0.5 SENSITIVE Sensitive     TRIMETH/SULFA <=10 SENSITIVE Sensitive     CLINDAMYCIN <=0.25 SENSITIVE Sensitive     RIFAMPIN <=0.5 SENSITIVE Sensitive     Inducible Clindamycin NEGATIVE Sensitive     * METHICILLIN RESISTANT STAPHYLOCOCCUS AUREUS  Urine Culture     Status: Abnormal  Collection Time: 04/17/21 11:58 AM   Specimen: Urine, Random  Result Value Ref Range Status   Specimen Description   Final    URINE, RANDOM Performed at [facility], [address] [place], [place] [address]    Special Requests   Final    NONE Performed at [facility], [address] [place], [place] [address]    Culture MULTIPLE SPECIES PRESENT, SUGGEST RECOLLECTION (A)  Final   Report Status 04/19/2021 FINAL  Final  Culture, blood (routine x 2)     Status: None   Collection Time: 04/19/21  7:55 AM   Specimen: BLOOD LEFT WRIST  Result Value Ref Range Status   Specimen Description BLOOD LEFT WRIST  Final   Special Requests   Final    BOTTLES DRAWN AEROBIC AND ANAEROBIC Blood Culture adequate volume   Culture   Final    NO GROWTH 5 DAYS Performed at [facility], [place] [address], [place], [place] [address]    Report Status 04/24/2021 FINAL  Final  Culture, blood (routine x 2)     Status: None   Collection Time: 04/19/21  8:02 AM   Specimen: BLOOD LEFT HAND  Result Value Ref Range Status   Specimen Description BLOOD LEFT HAND  Final   Special Requests   Final    BOTTLES DRAWN AEROBIC AND ANAEROBIC Blood Culture adequate volume   Culture   Final    NO  GROWTH 5 DAYS Performed at [facility], [place] [address], [place], [place] [address]    Report Status 04/24/2021 FINAL  Final  MRSA Next Gen by PCR, Nasal     Status: Abnormal   Collection Time: 04/19/21 11:13 AM   Specimen: Nasal Mucosa; Nasal Swab  Result Value Ref Range Status   MRSA by PCR Next Gen DETECTED (A) NOT DETECTED Final    Comment: RESULT CALLED TO, READ BACK BY AND VERIFIED WITH: D WHITE RN 1503 04/19/21 A BROWNING (NOTE) The GeneXpert MRSA Assay (FDA approved for NASAL specimens only), is one component of a comprehensive MRSA colonization surveillance program. It is not intended to diagnose MRSA infection nor to guide or monitor treatment for MRSA infections. Test performance is not FDA approved in patients less than 34 years old. Performed at [facility], [place] [address] [place], [place] [address]   Aerobic/Anaerobic Culture w Gram Stain (surgical/deep wound)     Status: None (Preliminary result)   Collection Time: 05/10/2021  8:23 AM   Specimen: Wound; Body Fluid  Result Value Ref Range Status   Specimen Description WOUND  Final   Special Requests  STERNAL WOUND  Final   Gram Stain   Final    FEW WBC PRESENT,BOTH PMN AND MONONUCLEAR FEW GRAM POSITIVE COCCI IN PAIRS Performed at [facility], [address] [place], [place] [address]    Culture   Final    MODERATE METHICILLIN RESISTANT STAPHYLOCOCCUS AUREUS NO ANAEROBES ISOLATED; CULTURE IN PROGRESS FOR 5 DAYS    Report Status PENDING  Incomplete   Organism ID, Bacteria METHICILLIN RESISTANT STAPHYLOCOCCUS AUREUS  Final      Susceptibility   Methicillin resistant staphylococcus aureus - MIC*    CIPROFLOXACIN >=8 RESISTANT Resistant     ERYTHROMYCIN <=0.25 SENSITIVE Sensitive     GENTAMICIN <=0.5 SENSITIVE Sensitive     OXACILLIN >=4 RESISTANT Resistant     TETRACYCLINE <=1 SENSITIVE Sensitive     VANCOMYCIN 1 SENSITIVE Sensitive  TRIMETH/SULFA <=10 SENSITIVE Sensitive     CLINDAMYCIN  <=0.25 SENSITIVE Sensitive     RIFAMPIN <=0.5 SENSITIVE Sensitive     Inducible Clindamycin NEGATIVE Sensitive     * MODERATE METHICILLIN RESISTANT STAPHYLOCOCCUS AUREUS  Aerobic/Anaerobic Culture w Gram Stain (surgical/deep wound)     Status: None (Preliminary result)   Collection Time: 04/29/2021  8:27 AM   Specimen: PATH Bone biopsy; Tissue  Result Value Ref Range Status   Specimen Description TISSUE  Final   Special Requests  STERNUM  Final   Gram Stain   Final    FEW WBC PRESENT,BOTH PMN AND MONONUCLEAR FEW GRAM POSITIVE COCCI IN PAIRS Performed at [facility], [address] [place], [place] [address]    Culture   Final    MODERATE METHICILLIN RESISTANT STAPHYLOCOCCUS AUREUS NO ANAEROBES ISOLATED; CULTURE IN PROGRESS FOR 5 DAYS    Report Status PENDING  Incomplete   Organism ID, Bacteria METHICILLIN RESISTANT STAPHYLOCOCCUS AUREUS  Final      Susceptibility   Methicillin resistant staphylococcus aureus - MIC*    CIPROFLOXACIN >=8 RESISTANT Resistant     ERYTHROMYCIN <=0.25 SENSITIVE Sensitive     GENTAMICIN <=0.5 SENSITIVE Sensitive     OXACILLIN >=4 RESISTANT Resistant     TETRACYCLINE <=1 SENSITIVE Sensitive     VANCOMYCIN 1 SENSITIVE Sensitive     TRIMETH/SULFA <=10 SENSITIVE Sensitive     CLINDAMYCIN <=0.25 SENSITIVE Sensitive     RIFAMPIN <=0.5 SENSITIVE Sensitive     Inducible Clindamycin NEGATIVE Sensitive     * MODERATE METHICILLIN RESISTANT STAPHYLOCOCCUS AUREUS    Assessment/Plan: S/P Procedure(s) (LRB): STERNAL WOUND WASHOUT (N/A) WOUND VAC CHANGE (N/A)  1 Tmax 99.2, VSS s BP 140's-160's 2 sats good on RA 3 leukocytosis is stable- conts same abc/VAC functioning well- , change next week 4 medical management per primary/ID/cards/palliative     LOS: 7 days    John Giovanni PA-C Pager [phone] 04/25/2021

## 2021-04-25 NOTE — Progress Notes (Signed)
PROGRESS NOTE    [name]   LGX:[id number]  DOB: December 24, 1935  PCP: [name], DO    DOA: 05/04/2021 LOS: 7    Brief Narrative / Hospital Course to Date:   85 y.o. male with medical history significant of HTN; HLD; remote prostate CA; CAD s/p remote CABG; and abdominal wall melanoma presenting to [facility] on 04/17/21 with sepsis, presenting with fever, encephalopathy, respiratory distress and A-fib RVR.  Transferred to [facility] on 05/07/2021 for CT surgery coverage due to a mediastinal fluid collection associated with transverse sternal fracture.  Patient found to have Staph bacteremia with findings of pulmonary septic emboli on CT scan.  Infectious disease, CT surgery, neurosurgery, palliative care consulted.   Assessment & Plan    MRSA bacteremia with severe sepsis, present on admission Sepsis POA with leukocytosis, tachycardia, tachypnea, hypoxia.  Evidence of acute organ failure with elevated lactate >2 that is not easily explained by another condition. Blood cultures were positive for MRSA. Septic emboli appreciated on imaging. Suspected source is unknown - PNA, spinal osteo, recent skin surgery are considerations (see below) Infectious disease was consulted.  Patient remains on vancomycin. Transthoracic echocardiogram showed normal systolic function.  No evidence for vegetation was noted. Patient remains afebrile.  WBC is elevated but stable.  TEE was being considered by infectious disease but cardiothoracic surgery does not think that it is necessary since it would not change management.  Patient needs long-term antibiotics anyway.  Discussed with his wife who says that she will not consent for this procedure.  We will request cardiology to cancel.   Will wait for ID input tomorrow regarding duration of antibiotics.  He will need a PICC line placed.  Anterior mediastinal abscess/phlegmon  Mediastinal fluid collection with gas/fluid with transverse sternal  fracture.  Cardiothoracic surgery was consulted. Patient taken for I&D on 11/8 and wound VAC placed. Underwent a wound VAC change in the OR on 11/10. Unclear of the patient will be going home with wound VAC or not.  Will need input from cardiothoracic surgery regarding this.  Plan is for wound VAC change sometime this week.  Wife is requesting early discharge.     Subdural Hematoma (SDH) Patient has had recent SDH x 2, acute on chronic, seen at [facility].  This was secondary to multiple falls. Also with compression fractures, ?osteomyelitis Anticoagulation and ASA are contraindicated at this time.   Right midlung PNA vs Septic Emboli  Appears to have septic emboli on imaging. Treated with Vanc/Zosyn on admission. Zosyn d/c'd per ID Continue Vancomycin Respiratory status stable for the most part.  Hypokalemia  Potassium will be repleted.  Check magnesium tomorrow.   New onset A-fib - POA, Likely triggered by infection. Anticoagulation contraindicated in setting of SDH. Remains on Cardizem infusion.  Patient had episode of tachycardia overnight which got controlled after he was given metoprolol.   Cardizem was changed over from IV to enteral.  Heart rate is stable.  Oropharyngeal dysphagia Mentation appears to be stable.  Seen by speech therapy today.  Changed to dysphagia 1 diet.    Nutrition Due to extremely poor oral intake cortrak feeding tube was placed.  Dysphagia 1 diet initiated today.  If he has reasonable oral intake we will discontinue his tube feedings tomorrow.  Wife is agreeable with this plan.  Does not want him to come home with any kind of tube feedings.  Thoracolumbar compression fractures with ? Osteomyelitis  No surgical intervention planned.  Seen by neurosurgery.  On antibiotics per ID.   Abdominal wall melanoma Has steri-strips in place, no obvious infection.  Path reports from excision reflect negative margins. Monitor.   Left AC superficial thrombophlebitis   Conservative management.  No DVT is noted on imaging studies.  Macrocytic anemia Drop in hemoglobin likely dilutional.  Hemoglobin is stable for the most part.  No overt bleeding noted.   Anemia panel reviewed.  No clear-cut deficiencies identified.  TSH is noted to be 1.2 with a normal free T4.   Essential hypertension  Currently on Cardizem.   Hyperlipidemia Hold pravastatin for now with AMS, resume when safe to take PO  Remote Prostate CA  Bladder wall thickening noted on imaging.  Monitor for now.  Outpatient follow-up with urology   Coronary artery disease s/p remote CABG   Delirium/acute metabolic encephalopathy Most likely due to infection, unclear if recent traumatic SDH contributing.  Hold sedating meds: Primidone, benadryl (consider d/c), Neurontin Patient remains confused but mentation has improved.  Continue to hold the setting medications.    Goals of care Palliative care has been consulted since patient's prognosis appears to be poor.  Discussed with patient's wife.  Patient has a son who lives out of town.  Patient's wife was told about guarded to poor prognosis especially if he does not start eating and drinking on his own.   Long discussion with patient and wife today.  Palliative care met with patient's wife yesterday.  She would like for him to come home on Monday which is tomorrow.  She was told about all the active medical issues that we are currently managing including his nutrition, IV antibiotics and chest wound issues.  From a practical standpoint it may not be possible for [place] to get him stabilized for discharge tomorrow.  She was quite upset to hear this.  At the same time she does want to continue all aggressive medical care.  I told her that we will try to expedite things for discharge possibly on Tuesday but this was also not certain.   DVT prophylaxis: SCDs CODE STATUS: DNR Family communication: Discussed with wife. Disposition: To be determined  Status  is: Inpatient  Remains inpatient appropriate because: Need for IV antibiotics and further surgical debridement     Subjective   Remains awake alert but distracted.  No distress.    Consults, Procedures, Significant Events   Consultants:  Infectious disease Palliative care Cardiothoracic surgery Neurosurgery  Procedures:  Irrigation and drainage of the mediastinal abscess.  Antimicrobials:  Anti-infectives (From admission, onward)    Start     Dose/Rate Route Frequency Ordered Stop   04/23/21 0830  vancomycin (VANCOREADY) IVPB 1250 mg/250 mL        1,250 mg 166.7 mL/hr over 90 Minutes Intravenous Every 24 hours 04/21/2021 1433     04/19/21 0830  vancomycin (VANCOREADY) IVPB 1750 mg/350 mL  Status:  Discontinued        1,750 mg 175 mL/hr over 120 Minutes Intravenous Every 24 hours 04/19/21 0742 04/25/2021 1433   04/17/2021 2000  vancomycin (VANCOREADY) IVPB 1750 mg/350 mL  Status:  Discontinued        1,750 mg 175 mL/hr over 120 Minutes Intravenous Every 24 hours 04/21/2021 1007 05/02/2021 1706   04/14/2021 1400  piperacillin-tazobactam (ZOSYN) IVPB 3.375 g  Status:  Discontinued        3.375 g 12.5 mL/hr over 240 Minutes Intravenous Every 8 hours 05/02/2021 1007 04/19/21 0742         Micro  Objective   Vitals:   04/24/21 1913 04/24/21 2328 04/25/21 0352 04/25/21 0741  BP: (!) 147/98 (!) 154/87 (!) 161/91 (!) 145/101  Pulse: 92 100 100 (!) 102  Resp: _0 Temp: 98 F (36.7 C) 98.1 F (36.7 C) 98.1 F (36.7 C) 98.3 F (36.8 C)  TempSrc: Axillary Axillary Axillary Axillary  SpO2: 98% 94% 100% 100%  Weight:      Height:        Intake/Output Summary (Last 24 hours) at 04/25/2021 1046 Last data filed at 04/25/2021 0400 Gross per 24 hour  Intake 1128.33 ml  Output 1200 ml  Net -71.67 ml    Filed Weights   04/27/2021 0408 04/23/21 0331 04/24/21 0409  Weight: 88.8 kg 88.7 kg 93.1 kg    Physical Exam:  General appearance: Lethargic but easily arousable.   In no distress. Resp: Clear to auscultation bilaterally.  Normal effort Cardio: S1-S2 is normal regular.  No S3-S4.  No rubs murmurs or bruit GI: Abdomen is soft.  Nontender nondistended.  Bowel sounds are present normal.  No masses organomegaly Extremities: No edema.   Neurologic: No focal neurological deficits.      Labs   Data Reviewed: I have personally reviewed following labs and imaging studies  CBC: Recent Labs  Lab 04/21/21 0623 05/10/2021 0540 04/23/21 0614 04/24/21 0236 04/25/21 0329  WBC 9.9 12.7* 14.3* 14.4* 14.2*  HGB 7.5* 8.8* 8.5* 9.5* 8.6*  HCT 22.7* 26.1* 25.4* 28.7* 26.5*  MCV 101.8* 102.4* 103.3* 104.7* 104.7*  PLT 495* 582* 601* 540* 537*    Basic Metabolic Panel: Recent Labs  Lab 04/21/21 0210 04/21/21 1602 05/10/2021 0540 04/13/2021 1613 04/23/21 0614 04/24/21 0236 04/25/21 0329  NA 140  --  141  --  138 139 138  K 3.8  --  2.8*  --  3.3* 3.8 3.4*  CL 111  --  108  --  108 108 105  CO2 18*  --  24  --  _1 GLUCOSE 141*  --  124*  --  146* 142* 120*  BUN 22  --  23  --  _2 CREATININE 0.90  --  0.70  --  0.70 0.74 0.64  CALCIUM 9.3  --  9.4  --  8.9 8.9 8.5*  MG 1.8 1.8 1.8 1.6* 2.4  --   --   PHOS  --  3.2 3.4 3.5 3.7  --   --     GFR: Estimated Creatinine Clearance: 76.8 mL/min (by C-G formula based on SCr of 0.64 mg/dL). Liver Function Tests: Recent Labs  Lab 04/19/21 1901  AST 182*  ALT 94*  ALKPHOS 107  BILITOT 2.0*  PROT 5.9*  ALBUMIN 1.8*     Coagulation Profile: Recent Labs  Lab 04/19/21 1901  INR 1.9*    CBG: Recent Labs  Lab 04/24/21 1618 04/24/21 2011 04/25/21 0002 04/25/21 0403 04/25/21 0744  GLUCAP 120* 116* 128* 117* 119*    Sepsis Labs: Recent Labs  Lab 04/15/2021 0603 04/21/21 0210  PROCALCITON 1.36 0.77     Recent Results (from the past 240 hour(s))  Resp Panel by RT-PCR (Flu A&B, Covid) Nasopharyngeal Swab     Status: None   Collection Time: 04/17/21  8:56 AM   Specimen:  Nasopharyngeal Swab; Nasopharyngeal(NP) swabs in vial transport medium  Result Value Ref Range Status   SARS Coronavirus 2 by RT PCR NEGATIVE NEGATIVE Final    Comment: (NOTE) SARS-CoV-2 target nucleic acids  are NOT DETECTED.  The SARS-CoV-2 RNA is generally detectable in upper respiratory specimens during the acute phase of infection. The lowest concentration of SARS-CoV-2 viral copies this assay can detect is 138 copies/mL. A negative result does not preclude SARS-Cov-2 infection and should not be used as the sole basis for treatment or other patient management decisions. A negative result may occur with  improper specimen collection/handling, submission of specimen other than nasopharyngeal swab, presence of viral mutation(s) within the areas targeted by this assay, and inadequate number of viral copies(<138 copies/mL). A negative result must be combined with clinical observations, patient history, and epidemiological information. The expected result is Negative.  Fact Sheet for Patients:  EntrepreneurPulse.com.au  Fact Sheet for Healthcare Providers:  IncredibleEmployment.be  This test is no t yet approved or cleared by the [place] FDA and  has been authorized for detection and/or diagnosis of SARS-CoV-2 by FDA under an Emergency Use Authorization (EUA). This EUA will remain  in effect (meaning this test can be used) for the duration of the COVID-19 declaration under Section 564(b)(1) of the Act, 21 U.S.C.section 360bbb-3(b)(1), unless the authorization is terminated  or revoked sooner.       Influenza A by PCR NEGATIVE NEGATIVE Final   Influenza B by PCR NEGATIVE NEGATIVE Final    Comment: (NOTE) The Xpert Xpress SARS-CoV-2/FLU/RSV plus assay is intended as an aid in the diagnosis of influenza from Nasopharyngeal swab specimens and should not be used as a sole basis for treatment. Nasal washings and aspirates are unacceptable for  Xpert Xpress SARS-CoV-2/FLU/RSV testing.  Fact Sheet for Patients: EntrepreneurPulse.com.au  Fact Sheet for Healthcare Providers: IncredibleEmployment.be  This test is not yet approved or cleared by the [place] FDA and has been authorized for detection and/or diagnosis of SARS-CoV-2 by FDA under an Emergency Use Authorization (EUA). This EUA will remain in effect (meaning this test can be used) for the duration of the COVID-19 declaration under Section 564(b)(1) of the Act, 21 U.S.C. section 360bbb-3(b)(1), unless the authorization is terminated or revoked.  Performed at [facility], [address] [place], [place] [address]   Culture, blood (Routine x 2)     Status: Abnormal   Collection Time: 04/17/21  9:20 AM   Specimen: BLOOD  Result Value Ref Range Status   Specimen Description   Final    BLOOD RIGHT FA Performed at [facility], [address] [place], [place] [address]    Special Requests   Final    BOTTLES DRAWN AEROBIC AND ANAEROBIC Blood Culture adequate volume Performed at [facility], [address] [place], [place] [address]    Culture  Setup Time   Final    GRAM POSITIVE COCCI IN BOTH AEROBIC AND ANAEROBIC BOTTLES Organism ID to follow CRITICAL RESULT CALLED TO, READ BACK BY AND VERIFIED WITH: SAM RAUER 2203 04/17/21 LFD Performed at [facility], [address] [place], [place] [address]    Culture (A)  Final    STAPHYLOCOCCUS AUREUS SUSCEPTIBILITIES PERFORMED ON PREVIOUS CULTURE WITHIN THE LAST 5 DAYS. Performed at [facility], [place] [address], [place], [place] [address]    Report Status 04/24/2021 FINAL  Final  Blood Culture ID Panel (Reflexed)     Status: Abnormal   Collection Time: 04/17/21  9:20 AM  Result Value Ref Range Status   Enterococcus faecalis NOT DETECTED NOT DETECTED Final   Enterococcus Faecium NOT DETECTED NOT DETECTED Final   Listeria  monocytogenes NOT DETECTED NOT  DETECTED Final   Staphylococcus species DETECTED (A) NOT DETECTED Final    Comment: CRITICAL RESULT CALLED TO, READ BACK BY AND VERIFIED WITH: SAM RAUER 2203 04/17/21 LFD    Staphylococcus aureus (BCID) DETECTED (A) NOT DETECTED Final    Comment: Methicillin (oxacillin)-resistant Staphylococcus aureus (MRSA). MRSA is predictably resistant to beta-lactam antibiotics (except ceftaroline). Preferred therapy is vancomycin unless clinically contraindicated. Patient requires contact precautions if  hospitalized. CRITICAL RESULT CALLED TO, READ BACK BY AND VERIFIED WITH: SAM RAUER 2203 04/17/21 LFD    Staphylococcus epidermidis NOT DETECTED NOT DETECTED Final   Staphylococcus lugdunensis NOT DETECTED NOT DETECTED Final   Streptococcus species NOT DETECTED NOT DETECTED Final   Streptococcus agalactiae NOT DETECTED NOT DETECTED Final   Streptococcus pneumoniae NOT DETECTED NOT DETECTED Final   Streptococcus pyogenes NOT DETECTED NOT DETECTED Final   A.calcoaceticus-baumannii NOT DETECTED NOT DETECTED Final   Bacteroides fragilis NOT DETECTED NOT DETECTED Final   Enterobacterales NOT DETECTED NOT DETECTED Final   Enterobacter cloacae complex NOT DETECTED NOT DETECTED Final   Escherichia coli NOT DETECTED NOT DETECTED Final   Klebsiella aerogenes NOT DETECTED NOT DETECTED Final   Klebsiella oxytoca NOT DETECTED NOT DETECTED Final   Klebsiella pneumoniae NOT DETECTED NOT DETECTED Final   Proteus species NOT DETECTED NOT DETECTED Final   Salmonella species NOT DETECTED NOT DETECTED Final   Serratia marcescens NOT DETECTED NOT DETECTED Final   Haemophilus influenzae NOT DETECTED NOT DETECTED Final   Neisseria meningitidis NOT DETECTED NOT DETECTED Final   Pseudomonas aeruginosa NOT DETECTED NOT DETECTED Final   Stenotrophomonas maltophilia NOT DETECTED NOT DETECTED Final   Candida albicans NOT DETECTED NOT DETECTED Final   Candida auris NOT DETECTED NOT DETECTED  Final   Candida glabrata NOT DETECTED NOT DETECTED Final   Candida krusei NOT DETECTED NOT DETECTED Final   Candida parapsilosis NOT DETECTED NOT DETECTED Final   Candida tropicalis NOT DETECTED NOT DETECTED Final   Cryptococcus neoformans/gattii NOT DETECTED NOT DETECTED Final   Meth resistant mecA/C and MREJ DETECTED (A) NOT DETECTED Final    Comment: CRITICAL RESULT CALLED TO, READ BACK BY AND VERIFIED WITH: SAM RAUER 2203 04/17/21 LFD Performed at [facility], [address] [place], [place] [address]   Culture, blood (Routine x 2)     Status: Abnormal   Collection Time: 04/17/21  9:47 AM   Specimen: BLOOD  Result Value Ref Range Status   Specimen Description   Final    BLOOD RIGHT ARM Performed at [facility], [address] [place], [place] [address]    Special Requests   Final    BOTTLES DRAWN AEROBIC AND ANAEROBIC Blood Culture results may not be optimal due to an excessive volume of blood received in culture bottles Performed at [facility], [address] [place], [place] [address]    Culture  Setup Time   Final    GRAM POSITIVE COCCI IN BOTH AEROBIC AND ANAEROBIC BOTTLES CRITICAL VALUE NOTED.  VALUE IS CONSISTENT WITH PREVIOUSLY REPORTED AND CALLED VALUE. Performed at [facility], [address] [place], [place] [address]    Culture METHICILLIN RESISTANT STAPHYLOCOCCUS AUREUS (A)  Final   Report Status 05/08/2021 FINAL  Final   Organism ID, Bacteria METHICILLIN RESISTANT STAPHYLOCOCCUS AUREUS  Final      Susceptibility   Methicillin resistant staphylococcus aureus - MIC*    CIPROFLOXACIN >=8 RESISTANT Resistant     ERYTHROMYCIN <=0.25 SENSITIVE Sensitive     GENTAMICIN <=0.5 SENSITIVE Sensitive  OXACILLIN >=4 RESISTANT Resistant     TETRACYCLINE <=1 SENSITIVE Sensitive     VANCOMYCIN <=0.5 SENSITIVE Sensitive     TRIMETH/SULFA <=10 SENSITIVE Sensitive     CLINDAMYCIN <=0.25 SENSITIVE Sensitive     RIFAMPIN <=0.5  SENSITIVE Sensitive     Inducible Clindamycin NEGATIVE Sensitive     * METHICILLIN RESISTANT STAPHYLOCOCCUS AUREUS  Urine Culture     Status: Abnormal   Collection Time: 04/17/21 11:58 AM   Specimen: Urine, Random  Result Value Ref Range Status   Specimen Description   Final    URINE, RANDOM Performed at [facility], [address] [place], [place] [address]    Special Requests   Final    NONE Performed at [facility], [address] [place], [place] [address]    Culture MULTIPLE SPECIES PRESENT, SUGGEST RECOLLECTION (A)  Final   Report Status 04/19/2021 FINAL  Final  Culture, blood (routine x 2)     Status: None   Collection Time: 04/19/21  7:55 AM   Specimen: BLOOD LEFT WRIST  Result Value Ref Range Status   Specimen Description BLOOD LEFT WRIST  Final   Special Requests   Final    BOTTLES DRAWN AEROBIC AND ANAEROBIC Blood Culture adequate volume   Culture   Final    NO GROWTH 5 DAYS Performed at [facility], [place] [address] [place], [place] [address]    Report Status 04/24/2021 FINAL  Final  Culture, blood (routine x 2)     Status: None   Collection Time: 04/19/21  8:02 AM   Specimen: BLOOD LEFT HAND  Result Value Ref Range Status   Specimen Description BLOOD LEFT HAND  Final   Special Requests   Final    BOTTLES DRAWN AEROBIC AND ANAEROBIC Blood Culture adequate volume   Culture   Final    NO GROWTH 5 DAYS Performed at [facility], [place] [address], [place], [place] [address]    Report Status 04/24/2021 FINAL  Final  MRSA Next Gen by PCR, Nasal     Status: Abnormal   Collection Time: 04/19/21 11:13 AM   Specimen: Nasal Mucosa; Nasal Swab  Result Value Ref Range Status   MRSA by PCR Next Gen DETECTED (A) NOT DETECTED Final    Comment: RESULT CALLED TO, READ BACK BY AND VERIFIED WITH: D WHITE RN 1503 04/19/21 A BROWNING (NOTE) The GeneXpert MRSA Assay (FDA approved for NASAL specimens only), is one component of a comprehensive  MRSA colonization surveillance program. It is not intended to diagnose MRSA infection nor to guide or monitor treatment for MRSA infections. Test performance is not FDA approved in patients less than 61 years old. Performed at [facility], [place] [address], [place],  [address]   Aerobic/Anaerobic Culture w Gram Stain (surgical/deep wound)     Status: None (Preliminary result)   Collection Time: 04/14/2021  8:23 AM   Specimen: Wound; Body Fluid  Result Value Ref Range Status   Specimen Description WOUND  Final   Special Requests  STERNAL WOUND  Final   Gram Stain   Final    FEW WBC PRESENT,BOTH PMN AND MONONUCLEAR FEW GRAM POSITIVE COCCI IN PAIRS Performed at [facility], [address], [place],  [address]    Culture   Final    MODERATE METHICILLIN RESISTANT STAPHYLOCOCCUS AUREUS NO ANAEROBES ISOLATED; CULTURE IN PROGRESS FOR 5 DAYS    Report Status PENDING  Incomplete   Organism  ID, Bacteria METHICILLIN RESISTANT STAPHYLOCOCCUS AUREUS  Final      Susceptibility   Methicillin resistant staphylococcus aureus - MIC*    CIPROFLOXACIN >=8 RESISTANT Resistant     ERYTHROMYCIN <=0.25 SENSITIVE Sensitive     GENTAMICIN <=0.5 SENSITIVE Sensitive     OXACILLIN >=4 RESISTANT Resistant     TETRACYCLINE <=1 SENSITIVE Sensitive     VANCOMYCIN 1 SENSITIVE Sensitive     TRIMETH/SULFA <=10 SENSITIVE Sensitive     CLINDAMYCIN <=0.25 SENSITIVE Sensitive     RIFAMPIN <=0.5 SENSITIVE Sensitive     Inducible Clindamycin NEGATIVE Sensitive     * MODERATE METHICILLIN RESISTANT STAPHYLOCOCCUS AUREUS  Aerobic/Anaerobic Culture w Gram Stain (surgical/deep wound)     Status: None (Preliminary result)   Collection Time: 05/12/2021  8:27 AM   Specimen: PATH Bone biopsy; Tissue  Result Value Ref Range Status   Specimen Description TISSUE  Final   Special Requests  STERNUM  Final   Gram Stain   Final    FEW WBC PRESENT,BOTH PMN AND MONONUCLEAR FEW GRAM POSITIVE COCCI IN  PAIRS Performed at [facility], [address] [place], [place] [address]    Culture   Final    MODERATE METHICILLIN RESISTANT STAPHYLOCOCCUS AUREUS NO ANAEROBES ISOLATED; CULTURE IN PROGRESS FOR 5 DAYS    Report Status PENDING  Incomplete   Organism ID, Bacteria METHICILLIN RESISTANT STAPHYLOCOCCUS AUREUS  Final      Susceptibility   Methicillin resistant staphylococcus aureus - MIC*    CIPROFLOXACIN >=8 RESISTANT Resistant     ERYTHROMYCIN <=0.25 SENSITIVE Sensitive     GENTAMICIN <=0.5 SENSITIVE Sensitive     OXACILLIN >=4 RESISTANT Resistant     TETRACYCLINE <=1 SENSITIVE Sensitive     VANCOMYCIN 1 SENSITIVE Sensitive     TRIMETH/SULFA <=10 SENSITIVE Sensitive     CLINDAMYCIN <=0.25 SENSITIVE Sensitive     RIFAMPIN <=0.5 SENSITIVE Sensitive     Inducible Clindamycin NEGATIVE Sensitive     * MODERATE METHICILLIN RESISTANT STAPHYLOCOCCUS AUREUS       Imaging Studies   No results found.   Medications   Scheduled Meds:  acetaminophen  1,000 mg Per Tube Q6H   azelastine  1 spray Each Nare BID   chlorhexidine  15 mL Mouth Rinse BID   Chlorhexidine Gluconate Cloth  6 each Topical Daily   diltiazem  30 mg Per Tube Q6H   docusate  100 mg Per Tube BID   feeding supplement (PROSource TF)  45 mL Per Tube BID   mouth rinse  15 mL Mouth Rinse q12n4p   sodium chloride flush  3 mL Intravenous Q12H   Continuous Infusions:  0.45 % NaCl with KCl 20 mEq / L 50 mL/hr at 04/25/21 0101   feeding supplement (JEVITY 1.5 CAL/FIBER) 1,000 mL (04/24/21 1349)   vancomycin 1,250 mg (04/25/21 0807)       LOS: 7 days    Bonnielee Haff,  [facility]  04/25/2021, 10:46 AM

## 2021-04-25 NOTE — Progress Notes (Signed)
PHARMACY CONSULT NOTE FOR:  OUTPATIENT  PARENTERAL ANTIBIOTIC THERAPY (OPAT)  Indication: osteomyelitis/BSI Regimen: vancomycin 1250 mg IV every 24 hours End date: 06/01/2021  IV antibiotic discharge orders are pended. To discharging provider:  please sign these orders via discharge navigator,  Select New Orders & click on the button choice - Manage This Unsigned Work.     Thank you for allowing pharmacy to be a part of this patient's care.  Zenaida Deed, PharmD PGY1 Acute Care Pharmacy Resident  Phone: [phone] 04/25/2021  1:42 PM  Please check [email or web address] for unit-specific pharmacy phone numbers.

## 2021-04-25 NOTE — Progress Notes (Addendum)
Pharmacy Antibiotic Note  [name] is a 85 y.o. male admitted on 04/25/2021 with MRSA bacteremia/sternal osteo and possible septic pulmonary emboli. He is s/p debridement of the sternum on 11/8 with repeat wound washout on 11/10. Scr is stable at <1.   Plan: Continue vancomycin 1250 mg IV every 24 hours Vancomycin levels will be drawn Monday 11/14 (peak) and Tuesday 11/15 (trough) F/u dose pending levels Monitor renal function  Height: 6' 0.01" (182.9 cm) Weight: 93.1 kg (205 lb 4 oz) IBW/kg (Calculated) : 77.62  Temp (24hrs), Avg:98.1 F (36.7 C), Min:98 F (36.7 C), Max:98.3 F (36.8 C)  Recent Labs  Lab 04/21/21 0210 04/21/21 0623 04/17/2021 0540 05/08/2021 1240 04/23/21 0614 04/24/21 0236 04/25/21 0329  WBC  --  9.9 12.7*  --  14.3* 14.4* 14.2*  CREATININE 0.90  --  0.70  --  0.70 0.74 0.64  VANCOTROUGH  --   --  20  --   --   --   --   VANCOPEAK  --   --   --  39  --   --   --     Estimated Creatinine Clearance: 76.8 mL/min (by C-G formula based on SCr of 0.64 mg/dL).    Allergies  Allergen Reactions   Codeine Nausea And Vomiting   Indocin [Indomethacin] Other (See Comments)   Latex Other (See Comments)    Blisters   Mupirocin     Blisters   Plavix [Clopidogrel Bisulfate]     GI intolerance, n/v   Polysporin [Bacitracin-Polymyxin B]     Blisters   Shellfish Allergy Nausea And Vomiting    Mussels   Tape    Neosporin [Neomycin-Bacitracin Zn-Polymyx] Rash   Other Nausea And Vomiting    mussels    Antimicrobials this admission: Zosyn 11/6 >> 11/7 Vancomcyin 11/6 >>   Dose adjustments this admission: Vancomycin 1750 mg every 24 hours >> vancomycin 1250 mg every 24 hours on 11/11  Microbiology results: 11/5 BCx: 4/4 staph aureus w/ MecA/C and MREJ resistance  11/5 UCx: mult sp  11/7 BCx: ngtd 11/8 Sternal tissue: MRSA 11/8: Sternal wound fluid: MRSA  Thank you for allowing pharmacy to be a part of this patient's care.  Zenaida Deed,  PharmD PGY1 Acute Care Pharmacy Resident  Phone: [phone] 04/25/2021  1:47 PM  Please check [email or web address] for unit-specific pharmacy phone numbers.

## 2021-04-25 NOTE — Progress Notes (Signed)
Spoke with Eugene Garnet RN re PICC order.  States pt has adequate PIV access for current meds.  PICC is ordered for home ABT with currently no known DC date, but possible dc Monday. Notified PICC will not be done 04/24/21.

## 2021-04-26 ENCOUNTER — Inpatient Hospital Stay (HOSPITAL_COMMUNITY): Payer: Medicare Other

## 2021-04-26 ENCOUNTER — Encounter (HOSPITAL_COMMUNITY): Admission: AD | Disposition: E | Payer: Self-pay | Attending: Internal Medicine

## 2021-04-26 DIAGNOSIS — M869 Osteomyelitis, unspecified: Secondary | ICD-10-CM | POA: Diagnosis not present

## 2021-04-26 DIAGNOSIS — I4891 Unspecified atrial fibrillation: Secondary | ICD-10-CM | POA: Diagnosis not present

## 2021-04-26 DIAGNOSIS — I2690 Septic pulmonary embolism without acute cor pulmonale: Secondary | ICD-10-CM

## 2021-04-26 DIAGNOSIS — R410 Disorientation, unspecified: Secondary | ICD-10-CM | POA: Diagnosis not present

## 2021-04-26 DIAGNOSIS — B9562 Methicillin resistant Staphylococcus aureus infection as the cause of diseases classified elsewhere: Secondary | ICD-10-CM | POA: Diagnosis not present

## 2021-04-26 DIAGNOSIS — S21101A Unspecified open wound of right front wall of thorax without penetration into thoracic cavity, initial encounter: Secondary | ICD-10-CM | POA: Diagnosis not present

## 2021-04-26 DIAGNOSIS — R7881 Bacteremia: Secondary | ICD-10-CM | POA: Diagnosis not present

## 2021-04-26 DIAGNOSIS — S065XAA Traumatic subdural hemorrhage with loss of consciousness status unknown, initial encounter: Secondary | ICD-10-CM | POA: Diagnosis not present

## 2021-04-26 LAB — GLUCOSE, CAPILLARY
Glucose-Capillary: 101 mg/dL — ABNORMAL HIGH (ref 70–99)
Glucose-Capillary: 115 mg/dL — ABNORMAL HIGH (ref 70–99)
Glucose-Capillary: 125 mg/dL — ABNORMAL HIGH (ref 70–99)
Glucose-Capillary: 128 mg/dL — ABNORMAL HIGH (ref 70–99)
Glucose-Capillary: 132 mg/dL — ABNORMAL HIGH (ref 70–99)
Glucose-Capillary: 137 mg/dL — ABNORMAL HIGH (ref 70–99)
Glucose-Capillary: 140 mg/dL — ABNORMAL HIGH (ref 70–99)

## 2021-04-26 LAB — CBC
HCT: 26.2 % — ABNORMAL LOW (ref 39.0–52.0)
Hemoglobin: 8.5 g/dL — ABNORMAL LOW (ref 13.0–17.0)
MCH: 34.3 pg — ABNORMAL HIGH (ref 26.0–34.0)
MCHC: 32.4 g/dL (ref 30.0–36.0)
MCV: 105.6 fL — ABNORMAL HIGH (ref 80.0–100.0)
Platelets: 523 10*3/uL — ABNORMAL HIGH (ref 150–400)
RBC: 2.48 MIL/uL — ABNORMAL LOW (ref 4.22–5.81)
RDW: 15.6 % — ABNORMAL HIGH (ref 11.5–15.5)
WBC: 15.8 10*3/uL — ABNORMAL HIGH (ref 4.0–10.5)
nRBC: 0 % (ref 0.0–0.2)

## 2021-04-26 LAB — BASIC METABOLIC PANEL
Anion gap: 6 (ref 5–15)
BUN: 18 mg/dL (ref 8–23)
CO2: 25 mmol/L (ref 22–32)
Calcium: 8.8 mg/dL — ABNORMAL LOW (ref 8.9–10.3)
Chloride: 107 mmol/L (ref 98–111)
Creatinine, Ser: 0.58 mg/dL — ABNORMAL LOW (ref 0.61–1.24)
GFR, Estimated: >60 mL/min (ref >60)
Glucose, Bld: 134 mg/dL — ABNORMAL HIGH (ref 70–99)
Potassium: 3.7 mmol/L (ref 3.5–5.1)
Sodium: 138 mmol/L (ref 135–145)

## 2021-04-26 LAB — VANCOMYCIN, PEAK: Vancomycin Pk: 40 ug/mL (ref 30–40)

## 2021-04-26 SURGERY — ECHOCARDIOGRAM, TRANSESOPHAGEAL
Anesthesia: Monitor Anesthesia Care

## 2021-04-26 MED ORDER — DILTIAZEM 12 MG/ML ORAL SUSPENSION
60.0000 mg | Freq: Four times a day (QID) | ORAL | Status: DC
Start: 2021-04-26 — End: 2021-05-03
  Administered 2021-04-26 – 2021-05-03 (×27): 60 mg
  Filled 2021-04-26 (×31): qty 6

## 2021-04-26 MED ORDER — SODIUM CHLORIDE 0.9% FLUSH: 10.0000 mL | INTRAVENOUS | Status: DC | PRN

## 2021-04-26 MED ORDER — DILTIAZEM 12 MG/ML ORAL SUSPENSION
30.0000 mg | Freq: Once | ORAL | Status: AC
  Administered 2021-04-26: 30 mg
  Filled 2021-04-26: qty 3

## 2021-04-26 MED ORDER — SODIUM CHLORIDE 0.9% FLUSH
10.0000 mL | Freq: Two times a day (BID) | INTRAVENOUS | Status: DC
  Administered 2021-04-26 – 2021-05-05 (×14): 10 mL

## 2021-04-26 NOTE — Care Management Important Message (Signed)
Important Message  Patient Details  Name: [name] MRN: [id number] Date of Birth: 10-10-1935   Medicare Important Message Given:  Yes     [name] 05/10/2021, 10:46 AM

## 2021-04-26 NOTE — Progress Notes (Signed)
Speech Language Pathology Treatment: Dysphagia  Patient Details Name: [name] MRN: [id number] DOB: [birth year] Today's Date: 04/25/2021 Time: 1130-1205 SLP Time Calculation (min) (ACUTE ONLY): 35 min  Assessment / Plan / Recommendation Clinical Impression  Pt seen at bedside to assess readiness to advance textures. Pt was resting in bed, no family present. Pt kept eyes closed throughout this session, but did respond verbally. Some verbalizations were completely unintelligible, while others were very clear. Pt tolerated oral care, but clearly did not like it. Following oral care, pt accepted trials of ice chips, puree, graham cracker, and water via straw. Poor awareness and extended oral prep noted on solid trials. Pt would not open mouth wide enough to assess oral clearing. Pt had difficulty drinking from a straw initially, but by the end of the session he was able to take several boluses. No overt s/s aspiration on any consistency given, however, pt's variable mentation and inability/unwillingness to consistently follow commands increases his risk of aspiration significantly. Recommend continuing with dys 1 (puree) diet and thin liquids with 1:1 assist for all PO intake. Safe swallow precautions posted at [facility]. Pt continues to have Cortrak in place. SLP will follow to assess readiness to advance solids.    HPI HPI: Pt is an 85 yo male who presented 11/5 with AMS after being d/c home on 11/4 from [facility] where he was admitted for fall with LOC and SDH. Chest CT revealed large mediastinum fluid collection with gas, with a transverse fx of sternum. Pt s/p sternal I&D and wound vac placement on 11/8 and repeat debridement on 11/10. Cortrak placed 11/9. PMT meeting 11/12: pt's wife would like to continue aggressive medical care and wants him d/c on 11/14. PMH: frequent falls, polypharmacy, neuropathy, dementia, chronic hyponatremia, HTN.      SLP Plan  Continue with current plan of care       Recommendations for follow up therapy are one component of a multi-disciplinary discharge planning process, led by the attending physician.  Recommendations may be updated based on patient status, additional functional criteria and insurance authorization.    Recommendations  Diet recommendations: Dysphagia 1 (puree);Thin liquid Liquids provided via: Straw;Cup Medication Administration: Crushed with puree (or via cortrak) Supervision: Trained caregiver to feed patient;Full supervision/cueing for compensatory strategies Compensations: Slow rate;Small sips/bites;Minimize environmental distractions Postural Changes and/or Swallow Maneuvers: Seated upright 90 degrees;Upright 30-60 min after meal                Oral Care Recommendations: Oral care QID Follow Up Recommendations: Skilled nursing-short term rehab (<3 hours/day) Assistance recommended at discharge: Frequent or constant Supervision/Assistance SLP Visit Diagnosis: Dysphagia, unspecified (R13.10) Plan: Continue with current plan of care       [place]. Quentin Ore, [facility], [place] Speech Language Pathologist Office: [phone]  Shonna Chock  04/24/2021, 12:05 PM

## 2021-04-26 NOTE — Progress Notes (Signed)
Mobility Specialist: Progress Note   04/19/2021 1612  Mobility  Activity Dangled on edge of bed  Level of Assistance +2 (takes two people)  Assistive Device None  Mobility Sit up in bed/chair position for meals  Mobility Response Tolerated poorly  Mobility performed by Mobility specialist;Other (comment) (PT Katie)  $Mobility charge 1 Mobility   Pt required +2 maxA physical assistance to sit EOB from supine. Pt kept saying "please don't" when when sitting up. Pt back to to bed after walk with call bell at his side. Bed alarm is on.   [facility] Dorie Ohms Mobility Specialist Mobility Specialist Phone: [phone]

## 2021-04-26 NOTE — Progress Notes (Signed)
Physical Therapy Treatment Patient Details Name: [name] MRN: [id number] DOB: November 26, 1935 Today's Date: 05/03/2021   History of Present Illness Pt is an 85 yo male presenting 04/17/21 with AMS after being d/c home on 11/4 from [facility] where he was admitted for fall with LOC and SDH. Chest CT revealed large mediastinum fluid collection with gas, with a transverse fx of sternum. S/p sternal I&D and wound vac placement on 11/8; plan for repeat debridement on 11/10. S/p cortrak placement 11/9. PMH includes frequent falls, polypharmacy, neuropathy, dementia, chronic hyponatremia, HTN.    PT Comments    The pt was received in bed, and opened his eyes to arrival of therapist. The pt did respond to statements with "yeah" or "okay" but is resistant to any attempts of movement of extremities or bed mobility at this time. He achieved sitting EOB with maxA posterior support with totalA of 2, but continued to endorse discomfort (pt could not verbalize or indicate where) and made statements such as "please stop" throughout the session with any movement. The pt's vitals remained stable with all mobility, he followed x2 simple commands during the session, and was able to maintain static sitting once for 5-10 seconds with only minA.  Otherwise, the pt continues to demo worsening AMS and increased need for assist for any movements at this time.    Recommendations for follow up therapy are one component of a multi-disciplinary discharge planning process, led by the attending physician.  Recommendations may be updated based on patient status, additional functional criteria and insurance authorization.  Follow Up Recommendations  Skilled nursing-short term rehab (<3 hours/day)     Assistance Recommended at Discharge Frequent or constant Supervision/Assistance  Equipment Recommendations  Other (comment) (access to a lift for OOB mobility)    Recommendations for Other Services       Precautions /  Restrictions Precautions Precautions: Fall Precaution Comments: pt with recent SDH, frequent falls, delirium; sternal wound vac, cortrak Restrictions Weight Bearing Restrictions: No     Mobility  Bed Mobility Overal bed mobility: Needs Assistance Bed Mobility: Rolling;Supine to Sit;Sit to Supine Rolling: Total assist;+2 for physical assistance;+2 for safety/equipment   Supine to sit: Total assist;+2 for physical assistance;+2 for safety/equipment;HOB elevated Sit to supine: Total assist;+2 for physical assistance;+2 for safety/equipment   General bed mobility comments: totalA of 2 as pt either resisting or not participating in any movements    Transfers                   General transfer comment: deferred due to safety, pt resistant of any efforts of trunk flexion, going into trunk extension and needing assist to maintain hips on bed          Balance Overall balance assessment: Needs assistance;History of Falls Sitting-balance support: Bilateral upper extremity supported;Feet supported Sitting balance-Leahy Scale: Poor Sitting balance - Comments: dependent on posterior support and maxA, pt with only single bout of static sitting with minA Postural control: Posterior lean                                  Cognition Arousal/Alertness: Awake/alert;Lethargic Behavior During Therapy: Flat affect Overall Cognitive Status: No family/caregiver present to determine baseline cognitive functioning Area of Impairment: Orientation;Memory;Attention;Following commands;Safety/judgement;Awareness;Problem solving                 Orientation Level: Disoriented to;Time;Situation Current Attention Level: Focused Memory: Decreased recall of precautions;Decreased short-term  memory Following Commands: Follows one step commands inconsistently Safety/Judgement: Decreased awareness of safety;Decreased awareness of deficits Awareness: Intellectual Problem Solving: Slow  processing;Decreased initiation;Difficulty sequencing;Requires verbal cues;Requires tactile cues General Comments: pt initially lethargic, making statements and answering questions with nonsensical speech. Pt did open eyes and follow simple commands "look at me" or "hold my hand" with increased processing time. pt yelling "please don't" for most of session despite initially stating "yes" or "okay" when told plan for mobility        Exercises General Exercises - Lower Extremity Long Arc Quad: PROM;Both;10 reps;Seated Heel Slides: PROM;Both;10 reps;Seated    General Comments General comments (skin integrity, edema, etc.): large hematoma L hip, HR to 155bpm with static sitting EOB      Pertinent Vitals/Pain Pain Assessment: Faces Faces Pain Scale: Hurts a little bit Pain Location: generalized grimacing, but unable to determine pain vs fear of movement as pt not answering any questions about pain Pain Descriptors / Indicators: Grimacing;Guarding Pain Intervention(s): Limited activity within patient's tolerance;Monitored during session;Repositioned     PT Goals (current goals can now be found in the care plan section) Acute Rehab PT Goals Patient Stated Goal: to be in the sunshine PT Goal Formulation: Patient unable to participate in goal setting Time For Goal Achievement: 05/03/21 Potential to Achieve Goals: Fair Progress towards PT goals: Not progressing toward goals - comment (worsening AMS and command following)    Frequency    Min 2X/week      PT Plan Current plan remains appropriate       AM-PAC PT "6 Clicks" Mobility   Outcome Measure  Help needed turning from your back to your side while in a flat bed without using bedrails?: Total Help needed moving from lying on your back to sitting on the side of a flat bed without using bedrails?: Total Help needed moving to and from a bed to a chair (including a wheelchair)?: Total Help needed standing up from a chair using  your arms (e.g., wheelchair or bedside chair)?: Total Help needed to walk in hospital room?: Total Help needed climbing 3-5 steps with a railing? : Total 6 Click Score: 6    End of Session Equipment Utilized During Treatment: Oxygen Activity Tolerance: Patient limited by fatigue;Other (comment) (cognition/alertness and ability to follow commands) Patient left: in bed;with call bell/phone within reach;with bed alarm set;with SCD's reapplied Nurse Communication: Mobility status PT Visit Diagnosis: Other abnormalities of gait and mobility (R26.89);Repeated falls (R29.6);Muscle weakness (generalized) (M62.81);History of falling (Z91.81)     Time: 9163-8466 PT Time Calculation (min) (ACUTE ONLY): 28 min  Charges:  $Therapeutic Activity: 23-37 mins                     West Carbo, PT, DPT   Acute Rehabilitation Department Pager #: [phone]   Sandra Cockayne 05/07/2021, 4:20 PM

## 2021-04-26 NOTE — Progress Notes (Signed)
PROGRESS NOTE    [name]   GMW:[id number]  DOB: 04/28/1936  PCP: [name], DO    DOA: 04/13/2021 LOS: 8    Brief Narrative / Hospital Course to Date:   85 y.o. male with medical history significant of HTN; HLD; remote prostate CA; CAD s/p remote CABG; and abdominal wall melanoma presenting to [facility] on 04/17/21 with sepsis, presenting with fever, encephalopathy, respiratory distress and A-fib RVR.  Transferred to [facility] on 04/24/2021 for CT surgery coverage due to a mediastinal fluid collection associated with transverse sternal fracture.  Patient found to have Staph bacteremia with findings of pulmonary septic emboli on CT scan.  Infectious disease, CT surgery, neurosurgery, palliative care consulted.   Assessment & Plan    MRSA bacteremia with severe sepsis, present on admission Sepsis POA with leukocytosis, tachycardia, tachypnea, hypoxia.  Evidence of acute organ failure with elevated lactate >2 that is not easily explained by another condition. Blood cultures were positive for MRSA. Septic emboli appreciated on imaging. Suspected source is unknown - PNA, spinal osteo, recent skin surgery are considerations (see below) Infectious disease was consulted.  Patient remains on vancomycin. Transthoracic echocardiogram showed normal systolic function.  No evidence for vegetation was noted. Patient remains afebrile.  WBC remains elevated.   TEE was considered but wife does not want him to have this procedure plus per cardiothoracic surgery this may not be necessary as it would not change management.  Patient needs long-term IV antibiotics anyway.   Order for PICC line has been placed.  Anterior mediastinal abscess/phlegmon  Mediastinal fluid collection with gas/fluid with transverse sternal fracture.  Cardiothoracic surgery was consulted. Patient taken for I&D on 11/8 and wound VAC placed. Underwent a wound VAC change in the OR on 11/10. Unclear of the patient  will be going home with wound VAC or not.  Will need input from cardiothoracic surgery regarding this.  Plan is for wound VAC change sometime this week.  Wife is requesting early discharge.     Subdural Hematoma (SDH) Patient has had recent SDH x 2, acute on chronic, seen at [facility].  This was secondary to multiple falls. Also with compression fractures, ?osteomyelitis Anticoagulation and ASA are contraindicated at this time.   Right midlung PNA vs Septic Emboli  Appears to have septic emboli on imaging. Treated with Vanc/Zosyn on admission. Zosyn d/c'd per ID Continue Vancomycin Respiratory status is stable.  He is noted to be on oxygen by nasal cannula 2 L/min.  Hypokalemia  Potassium repleted.   New onset A-fib - POA, Likely triggered by infection. Anticoagulation contraindicated in setting of SDH. Was on Cardizem infusion which was changed over to enteral Cardizem through his NG tube.  Heart rate noted to be poorly controlled this morning.  We will increase the dose of his Cardizem.  Blood pressure noted to be stable.  Oropharyngeal dysphagia Seen by speech therapy.  Now on dysphagia 1 diet.  Nutrition Due to extremely poor oral intake cortrak feeding tube was placed.  Wife does not want him to come home with a feeding tube.  Continue dysphagia 1 diet for now.  Will discontinue NG tube just prior to discharge.    Thoracolumbar compression fractures with ? Osteomyelitis  No surgical intervention planned.  Seen by neurosurgery.  On antibiotics per ID.   Abdominal wall melanoma Has steri-strips in place, no obvious infection.  Path reports from excision reflect negative margins.   Left AC superficial thrombophlebitis  Conservative management.  No DVT  is noted on imaging studies.  Macrocytic anemia Drop in hemoglobin likely dilutional.  Hemoglobin is stable for the most part.  No overt bleeding noted.   Anemia panel reviewed.  No clear-cut deficiencies identified.   TSH is noted to  be 1.2 with a normal free T4.   Essential hypertension  Currently on Cardizem.   Hyperlipidemia Resume statin at discharge.  Remote Prostate CA  Bladder wall thickening noted on imaging.  Monitor for now.  Outpatient follow-up with urology.  Has a Foley catheter which will remain in place at discharge.   Coronary artery disease s/p remote CABG   Delirium/acute metabolic encephalopathy Most likely due to infection, unclear if recent traumatic SDH contributing.  Hold sedating meds: Primidone, benadryl (consider d/c), Neurontin Patient remains confused but mentation has improved.  Continue to hold the setting medications.    Goals of care Palliative care has been consulted since patient's prognosis appears to be poor.  They have had conversations with patient's wife.  Patient has a son who lives out of town.     Long discussion with patient's wife yesterday.  Palliative care met with patient's wife day before yesterday.   Patient wife told me that she would like for him to come home on Monday, that is today.  She was told about all the active medical issues that we are currently managing including his nutrition, IV antibiotics and chest wound issues.   She wants to continue all aggressive medical care short of heroic measures such as resuscitation and ventilator support.  Does not want feeding tube currently.   In view of this she was told that the patient may not be ready for discharge today since there are multiple active issues that needed to be resolved.  Will need to know from cardiothoracic surgery as to the plan regarding the wound VAC on his chest. Patient wife was quite upset to hear this.  Despite multiple attempts to explain the situation to her she did not want to listen to me.  She subsequently hung up the phone.   Today his heart rate is poorly controlled.  He seems to be slightly more lethargic today which would make discharge today unsafe.  Prognosis is guarded to poor.  DVT  prophylaxis: SCDs CODE STATUS: DNR Family communication: Discussed with wife yesterday.  No family at bedside today.   Disposition: To be determined  Status is: Inpatient  Remains inpatient appropriate because: Need for IV antibiotics and further surgical debridement     Subjective   Noted to be awake but lethargic.  Does not communicate much.  No distress.    Consults, Procedures, Significant Events   Consultants:  Infectious disease Palliative care Cardiothoracic surgery Neurosurgery  Procedures:  Irrigation and drainage of the mediastinal abscess.  Antimicrobials:  Anti-infectives (From admission, onward)    Start     Dose/Rate Route Frequency Ordered Stop   04/23/21 0830  vancomycin (VANCOREADY) IVPB 1250 mg/250 mL        1,250 mg 166.7 mL/hr over 90 Minutes Intravenous Every 24 hours 04/23/2021 1433     04/19/21 0830  vancomycin (VANCOREADY) IVPB 1750 mg/350 mL  Status:  Discontinued        1,750 mg 175 mL/hr over 120 Minutes Intravenous Every 24 hours 04/19/21 0742 05/09/2021 1433   04/25/2021 2000  vancomycin (VANCOREADY) IVPB 1750 mg/350 mL  Status:  Discontinued        1,750 mg 175 mL/hr over 120 Minutes Intravenous Every 24 hours 05/10/2021 1007  04/21/2021 1706   04/23/2021 1400  piperacillin-tazobactam (ZOSYN) IVPB 3.375 g  Status:  Discontinued        3.375 g 12.5 mL/hr over 240 Minutes Intravenous Every 8 hours 04/29/2021 1007 04/19/21 0742         Micro    Objective   Vitals:   04/25/21 1900 04/25/21 2329 05/04/2021 0327 05/08/2021 0840  BP: 125/74 (!) 132/59 (!) 155/68 (!) 142/87  Pulse: (!) 105 64 (!) 114 (!) 114  Resp: (!) 22 16 (!) 21 (!) 21  Temp: 98.9 F (37.2 C) 98.5 F (36.9 C) 98.9 F (37.2 C) (!) 97.4 F (36.3 C)  TempSrc: Axillary Axillary Axillary Oral  SpO2: 100% 99% 98% 97%  Weight:      Height:        Intake/Output Summary (Last 24 hours) at 04/25/2021 1006 Last data filed at 05/12/2021 0710 Gross per 24 hour  Intake 763.5 ml   Output 3450 ml  Net -2686.5 ml    Filed Weights   04/17/2021 0408 04/23/21 0331 04/24/21 0409  Weight: 88.8 kg 88.7 kg 93.1 kg    Physical Exam:  General appearance: Lethargic but awake.  No distress NG tube is noted. Resp: Mildly tachypneic.  Diminished air entry at the bases.  No wheezing or rhonchi.  Few crackles. Cardio: S1-S2 is tachycardic, irregularly irregular GI: Abdomen is soft.  Nontender nondistended.  Bowel sounds are present normal.  No masses organomegaly Extremities: No edema.   Neurologic: No focal neurological deficits.     Labs   Data Reviewed: I have personally reviewed following labs and imaging studies  CBC: Recent Labs  Lab 04/23/2021 0540 04/23/21 0614 04/24/21 0236 04/25/21 0329 04/16/2021 0125  WBC 12.7* 14.3* 14.4* 14.2* 15.8*  HGB 8.8* 8.5* 9.5* 8.6* 8.5*  HCT 26.1* 25.4* 28.7* 26.5* 26.2*  MCV 102.4* 103.3* 104.7* 104.7* 105.6*  PLT 582* 601* 540* 537* 523*    Basic Metabolic Panel: Recent Labs  Lab 04/21/21 0210 04/21/21 1602 04/21/2021 0540 04/15/2021 1613 04/23/21 0614 04/24/21 0236 04/25/21 0329 04/29/2021 0125  NA 140  --  141  --  138 139 138 138  K 3.8  --  2.8*  --  3.3* 3.8 3.4* 3.7  CL 111  --  108  --  108 108 105 107  CO2 18*  --  24  --  '24 24 26 25  ' GLUCOSE 141*  --  124*  --  146* 142* 120* 134*  BUN 22  --  23  --  '17 18 16 18  ' CREATININE 0.90  --  0.70  --  0.70 0.74 0.64 0.58*  CALCIUM 9.3  --  9.4  --  8.9 8.9 8.5* 8.8*  MG 1.8 1.8 1.8 1.6* 2.4  --   --   --   PHOS  --  3.2 3.4 3.5 3.7  --   --   --     GFR: Estimated Creatinine Clearance: 76.8 mL/min (A) (by C-G formula based on SCr of 0.58 mg/dL (L)).  Liver Function Tests: Recent Labs  Lab 04/19/21 1901  AST 182*  ALT 94*  ALKPHOS 107  BILITOT 2.0*  PROT 5.9*  ALBUMIN 1.8*     Coagulation Profile: Recent Labs  Lab 04/19/21 1901  INR 1.9*    CBG: Recent Labs  Lab 04/25/21 1623 04/25/21 2002 04/30/2021 0001 04/15/2021 0357 05/12/2021 0836   GLUCAP 132* 121* 132* 101* 115*    Sepsis Labs: Recent Labs  Lab 05/10/2021 0603 04/21/21  0210  PROCALCITON 1.36 0.77     Recent Results (from the past 240 hour(s))  Resp Panel by RT-PCR (Flu A&B, Covid) Nasopharyngeal Swab     Status: None   Collection Time: 04/17/21  8:56 AM   Specimen: Nasopharyngeal Swab; Nasopharyngeal(NP) swabs in vial transport medium  Result Value Ref Range Status   SARS Coronavirus 2 by RT PCR NEGATIVE NEGATIVE Final    Comment: (NOTE) SARS-CoV-2 target nucleic acids are NOT DETECTED.  The SARS-CoV-2 RNA is generally detectable in upper respiratory specimens during the acute phase of infection. The lowest concentration of SARS-CoV-2 viral copies this assay can detect is 138 copies/mL. A negative result does not preclude SARS-Cov-2 infection and should not be used as the sole basis for treatment or other patient management decisions. A negative result may occur with  improper specimen collection/handling, submission of specimen other than nasopharyngeal swab, presence of viral mutation(s) within the areas targeted by this assay, and inadequate number of viral copies(<138 copies/mL). A negative result must be combined with clinical observations, patient history, and epidemiological information. The expected result is Negative.  Fact Sheet for Patients:  EntrepreneurPulse.com.au  Fact Sheet for Healthcare Providers:  IncredibleEmployment.be  This test is no t yet approved or cleared by the [place] FDA and  has been authorized for detection and/or diagnosis of SARS-CoV-2 by FDA under an Emergency Use Authorization (EUA). This EUA will remain  in effect (meaning this test can be used) for the duration of the COVID-19 declaration under Section 564(b)(1) of the Act, 21 U.S.C.section 360bbb-3(b)(1), unless the authorization is terminated  or revoked sooner.       Influenza A by PCR NEGATIVE NEGATIVE Final    Influenza B by PCR NEGATIVE NEGATIVE Final    Comment: (NOTE) The Xpert Xpress SARS-CoV-2/FLU/RSV plus assay is intended as an aid in the diagnosis of influenza from Nasopharyngeal swab specimens and should not be used as a sole basis for treatment. Nasal washings and aspirates are unacceptable for Xpert Xpress SARS-CoV-2/FLU/RSV testing.  Fact Sheet for Patients: EntrepreneurPulse.com.au  Fact Sheet for Healthcare Providers: IncredibleEmployment.be  This test is not yet approved or cleared by the [place] FDA and has been authorized for detection and/or diagnosis of SARS-CoV-2 by FDA under an Emergency Use Authorization (EUA). This EUA will remain in effect (meaning this test can be used) for the duration of the COVID-19 declaration under Section 564(b)(1) of the Act, 21 U.S.C. section 360bbb-3(b)(1), unless the authorization is terminated or revoked.  Performed at [facility], [address] [place], [place] [address]   Culture, blood (Routine x 2)     Status: Abnormal   Collection Time: 04/17/21  9:20 AM   Specimen: BLOOD  Result Value Ref Range Status   Specimen Description   Final    BLOOD RIGHT FA Performed at [facility], [address] [place], [place] [address]    Special Requests   Final    BOTTLES DRAWN AEROBIC AND ANAEROBIC Blood Culture adequate volume Performed at [facility], [address] [place], [place] [address]    Culture  Setup Time   Final    GRAM POSITIVE COCCI IN BOTH AEROBIC AND ANAEROBIC BOTTLES Organism ID to follow CRITICAL RESULT CALLED TO, READ BACK BY AND VERIFIED WITH: SAM RAUER 2203 04/17/21 LFD Performed at [facility], [address] [place], [place] [address]    Culture (A)  Final    STAPHYLOCOCCUS AUREUS SUSCEPTIBILITIES PERFORMED ON PREVIOUS CULTURE WITHIN THE  LAST 5 DAYS. Performed at [facility], [place] [address], [place], [place]  [address]    Report Status 04/16/2021 FINAL  Final  Blood Culture ID Panel (Reflexed)     Status: Abnormal   Collection Time: 04/17/21  9:20 AM  Result Value Ref Range Status   Enterococcus faecalis NOT DETECTED NOT DETECTED Final   Enterococcus Faecium NOT DETECTED NOT DETECTED Final   Listeria monocytogenes NOT DETECTED NOT DETECTED Final   Staphylococcus species DETECTED (A) NOT DETECTED Final    Comment: CRITICAL RESULT CALLED TO, READ BACK BY AND VERIFIED WITH: SAM RAUER 2203 04/17/21 LFD    Staphylococcus aureus (BCID) DETECTED (A) NOT DETECTED Final    Comment: Methicillin (oxacillin)-resistant Staphylococcus aureus (MRSA). MRSA is predictably resistant to beta-lactam antibiotics (except ceftaroline). Preferred therapy is vancomycin unless clinically contraindicated. Patient requires contact precautions if  hospitalized. CRITICAL RESULT CALLED TO, READ BACK BY AND VERIFIED WITH: SAM RAUER 2203 04/17/21 LFD    Staphylococcus epidermidis NOT DETECTED NOT DETECTED Final   Staphylococcus lugdunensis NOT DETECTED NOT DETECTED Final   Streptococcus species NOT DETECTED NOT DETECTED Final   Streptococcus agalactiae NOT DETECTED NOT DETECTED Final   Streptococcus pneumoniae NOT DETECTED NOT DETECTED Final   Streptococcus pyogenes NOT DETECTED NOT DETECTED Final   A.calcoaceticus-baumannii NOT DETECTED NOT DETECTED Final   Bacteroides fragilis NOT DETECTED NOT DETECTED Final   Enterobacterales NOT DETECTED NOT DETECTED Final   Enterobacter cloacae complex NOT DETECTED NOT DETECTED Final   Escherichia coli NOT DETECTED NOT DETECTED Final   Klebsiella aerogenes NOT DETECTED NOT DETECTED Final   Klebsiella oxytoca NOT DETECTED NOT DETECTED Final   Klebsiella pneumoniae NOT DETECTED NOT DETECTED Final   Proteus species NOT DETECTED NOT DETECTED Final   Salmonella species NOT DETECTED NOT DETECTED Final   Serratia marcescens NOT DETECTED NOT DETECTED Final   Haemophilus influenzae NOT  DETECTED NOT DETECTED Final   Neisseria meningitidis NOT DETECTED NOT DETECTED Final   Pseudomonas aeruginosa NOT DETECTED NOT DETECTED Final   Stenotrophomonas maltophilia NOT DETECTED NOT DETECTED Final   Candida albicans NOT DETECTED NOT DETECTED Final   Candida auris NOT DETECTED NOT DETECTED Final   Candida glabrata NOT DETECTED NOT DETECTED Final   Candida krusei NOT DETECTED NOT DETECTED Final   Candida parapsilosis NOT DETECTED NOT DETECTED Final   Candida tropicalis NOT DETECTED NOT DETECTED Final   Cryptococcus neoformans/gattii NOT DETECTED NOT DETECTED Final   Meth resistant mecA/C and MREJ DETECTED (A) NOT DETECTED Final    Comment: CRITICAL RESULT CALLED TO, READ BACK BY AND VERIFIED WITH: SAM RAUER 2203 04/17/21 LFD Performed at [facility], [address] [place], [place] [address]   Culture, blood (Routine x 2)     Status: Abnormal   Collection Time: 04/17/21  9:47 AM   Specimen: BLOOD  Result Value Ref Range Status   Specimen Description   Final    BLOOD RIGHT ARM Performed at [facility], [address] [place], [place] [address]    Special Requests   Final    BOTTLES DRAWN AEROBIC AND ANAEROBIC Blood Culture results may not be optimal due to an excessive volume of blood received in culture bottles Performed at [facility], [address] [place], [place] [address]    Culture  Setup Time   Final    GRAM POSITIVE COCCI IN BOTH AEROBIC AND ANAEROBIC BOTTLES CRITICAL VALUE NOTED.  VALUE IS CONSISTENT WITH PREVIOUSLY REPORTED AND CALLED VALUE. Performed at  [facility], [address], [place], [place] [address]    Culture METHICILLIN RESISTANT STAPHYLOCOCCUS AUREUS (A)  Final   Report Status 04/13/2021 FINAL  Final   Organism ID, Bacteria METHICILLIN RESISTANT STAPHYLOCOCCUS AUREUS  Final      Susceptibility   Methicillin resistant staphylococcus aureus - MIC*    CIPROFLOXACIN >=8 RESISTANT Resistant      ERYTHROMYCIN <=0.25 SENSITIVE Sensitive     GENTAMICIN <=0.5 SENSITIVE Sensitive     OXACILLIN >=4 RESISTANT Resistant     TETRACYCLINE <=1 SENSITIVE Sensitive     VANCOMYCIN <=0.5 SENSITIVE Sensitive     TRIMETH/SULFA <=10 SENSITIVE Sensitive     CLINDAMYCIN <=0.25 SENSITIVE Sensitive     RIFAMPIN <=0.5 SENSITIVE Sensitive     Inducible Clindamycin NEGATIVE Sensitive     * METHICILLIN RESISTANT STAPHYLOCOCCUS AUREUS  Urine Culture     Status: Abnormal   Collection Time: 04/17/21 11:58 AM   Specimen: Urine, Random  Result Value Ref Range Status   Specimen Description   Final    URINE, RANDOM Performed at [facility], [address], [place], [place] [address]    Special Requests   Final    NONE Performed at [facility], [address] [place], [place] [address]    Culture MULTIPLE SPECIES PRESENT, SUGGEST RECOLLECTION (A)  Final   Report Status 04/19/2021 FINAL  Final  Culture, blood (routine x 2)     Status: None   Collection Time: 04/19/21  7:55 AM   Specimen: BLOOD LEFT WRIST  Result Value Ref Range Status   Specimen Description BLOOD LEFT WRIST  Final   Special Requests   Final    BOTTLES DRAWN AEROBIC AND ANAEROBIC Blood Culture adequate volume   Culture   Final    NO GROWTH 5 DAYS Performed at [facility], [address], [place], [place] [address]    Report Status 04/24/2021 FINAL  Final  Culture, blood (routine x 2)     Status: None   Collection Time: 04/19/21  8:02 AM   Specimen: BLOOD LEFT HAND  Result Value Ref Range Status   Specimen Description BLOOD LEFT HAND  Final   Special Requests   Final    BOTTLES DRAWN AEROBIC AND ANAEROBIC Blood Culture adequate volume   Culture   Final    NO GROWTH 5 DAYS Performed at [facility], [place] [address], [place], [place] [address]    Report Status 04/24/2021 FINAL  Final  MRSA Next Gen by PCR, Nasal     Status: Abnormal   Collection Time: 04/19/21 11:13 AM   Specimen: Nasal Mucosa;  Nasal Swab  Result Value Ref Range Status   MRSA by PCR Next Gen DETECTED (A) NOT DETECTED Final    Comment: RESULT CALLED TO, READ BACK BY AND VERIFIED WITH: D WHITE RN 1503 04/19/21 A BROWNING (NOTE) The GeneXpert MRSA Assay (FDA approved for NASAL specimens only), is one component of a comprehensive MRSA colonization surveillance program. It is not intended to diagnose MRSA infection nor to guide or monitor treatment for MRSA infections. Test performance is not FDA approved in patients less than 66 years old. Performed at [facility], [place] [address], [place], [place] [address]   Aerobic/Anaerobic Culture w Gram Stain (surgical/deep wound)     Status: None   Collection Time: 04/21/2021  8:23 AM   Specimen: Wound; Body Fluid  Result Value Ref Range Status   Specimen Description WOUND  Final  Special Requests  STERNAL WOUND  Final   Gram Stain   Final    FEW WBC PRESENT,BOTH PMN AND MONONUCLEAR FEW GRAM POSITIVE COCCI IN PAIRS    Culture   Final    MODERATE METHICILLIN RESISTANT STAPHYLOCOCCUS AUREUS NO ANAEROBES ISOLATED Performed at [facility], [address], [place], [place] [address]    Report Status 04/25/2021 FINAL  Final   Organism ID, Bacteria METHICILLIN RESISTANT STAPHYLOCOCCUS AUREUS  Final      Susceptibility   Methicillin resistant staphylococcus aureus - MIC*    CIPROFLOXACIN >=8 RESISTANT Resistant     ERYTHROMYCIN <=0.25 SENSITIVE Sensitive     GENTAMICIN <=0.5 SENSITIVE Sensitive     OXACILLIN >=4 RESISTANT Resistant     TETRACYCLINE <=1 SENSITIVE Sensitive     VANCOMYCIN 1 SENSITIVE Sensitive     TRIMETH/SULFA <=10 SENSITIVE Sensitive     CLINDAMYCIN <=0.25 SENSITIVE Sensitive     RIFAMPIN <=0.5 SENSITIVE Sensitive     Inducible Clindamycin NEGATIVE Sensitive     * MODERATE METHICILLIN RESISTANT STAPHYLOCOCCUS AUREUS  Aerobic/Anaerobic Culture w Gram Stain (surgical/deep wound)     Status: None   Collection Time: 04/21/2021  8:27 AM    Specimen: PATH Bone biopsy; Tissue  Result Value Ref Range Status   Specimen Description TISSUE  Final   Special Requests  STERNUM  Final   Gram Stain   Final    FEW WBC PRESENT,BOTH PMN AND MONONUCLEAR FEW GRAM POSITIVE COCCI IN PAIRS    Culture   Final    MODERATE METHICILLIN RESISTANT STAPHYLOCOCCUS AUREUS NO ANAEROBES ISOLATED Performed at [facility], [place] [address] [place], [place] [address]    Report Status 04/25/2021 FINAL  Final   Organism ID, Bacteria METHICILLIN RESISTANT STAPHYLOCOCCUS AUREUS  Final      Susceptibility   Methicillin resistant staphylococcus aureus - MIC*    CIPROFLOXACIN >=8 RESISTANT Resistant     ERYTHROMYCIN <=0.25 SENSITIVE Sensitive     GENTAMICIN <=0.5 SENSITIVE Sensitive     OXACILLIN >=4 RESISTANT Resistant     TETRACYCLINE <=1 SENSITIVE Sensitive     VANCOMYCIN 1 SENSITIVE Sensitive     TRIMETH/SULFA <=10 SENSITIVE Sensitive     CLINDAMYCIN <=0.25 SENSITIVE Sensitive     RIFAMPIN <=0.5 SENSITIVE Sensitive     Inducible Clindamycin NEGATIVE Sensitive     * MODERATE METHICILLIN RESISTANT STAPHYLOCOCCUS AUREUS       Imaging Studies   [place] EKG SITE RITE  Result Date: 04/25/2021 If Site Rite image not attached, placement could not be confirmed due to current cardiac rhythm.    Medications   Scheduled Meds:  acetaminophen  1,000 mg Per Tube Q6H   azelastine  1 spray Each Nare BID   chlorhexidine  15 mL Mouth Rinse BID   Chlorhexidine Gluconate Cloth  6 each Topical Daily   diltiazem  30 mg Per Tube Once   diltiazem  60 mg Per Tube Q6H   docusate  100 mg Per Tube BID   feeding supplement (PROSource TF)  45 mL Per Tube BID   mouth rinse  15 mL Mouth Rinse q12n4p   sodium chloride flush  3 mL Intravenous Q12H   Continuous Infusions:  0.45 % NaCl with KCl 20 mEq / L 10 mL/hr at 05/02/2021 0646   feeding supplement (JEVITY 1.5 CAL/FIBER) Stopped (04/19/2021 0000)   vancomycin 1,250 mg (04/25/21 0807)       LOS: 8 days     Bonnielee Haff,  [facility]  05/04/2021, 10:06 AM

## 2021-04-26 NOTE — Progress Notes (Signed)
Peripherally Inserted Central Catheter Placement  The IV Nurse has discussed with the patient and/or persons authorized to consent for the patient, the purpose of this procedure and the potential benefits and risks involved with this procedure.  The benefits include less needle sticks, lab draws from the catheter, and the patient may be discharged home with the catheter. Risks include, but not limited to, infection, bleeding, blood clot (thrombus formation), and puncture of an artery; nerve damage and irregular heartbeat and possibility to perform a PICC exchange if needed/ordered by physician.  Alternatives to this procedure were also discussed.  Bard Power PICC patient education guide, fact sheet on infection prevention and patient information card has been provided to patient /or left at bedside.  Telephone consent obtained from wife due to altered mental status.  PICC Placement Documentation  PICC Single Lumen 47/42/59 Right Basilic 41 cm 0 cm (Active)  Indication for Insertion or Continuance of Line Home intravenous therapies (PICC only);Prolonged intravenous therapies 05/09/2021 2152  Exposed Catheter (cm) 0 cm 05/12/2021 2152  Site Assessment Clean;Dry;Intact 05/11/2021 2152  Line Status Flushed;Saline locked;Blood return noted 04/23/2021 2152  Dressing Type Transparent 04/21/2021 2152  Dressing Status Clean;Dry;Intact 05/02/2021 2152  Antimicrobial disc in place? Yes 04/13/2021 2152  Safety Lock Not Applicable 56/38/75 6433  Line Care Connections checked and tightened 04/17/2021 2152  Line Adjustment (NICU/IV Team Only) No 05/07/2021 2152  Dressing Intervention New dressing 04/25/2021 2152  Dressing Change Due 05/03/21 04/16/2021 2152       Andon Villard, Nicolette Bang 05/11/2021, 9:54 PM

## 2021-04-26 NOTE — Progress Notes (Signed)
PHARMACY CONSULT NOTE FOR:  OUTPATIENT  PARENTERAL ANTIBIOTIC THERAPY (OPAT)  Indication: osteomyelitis/BSI Regimen: vancomycin 1250 mg IV every 24 hours End date: 06/03/2021  IV antibiotic discharge orders are pended. To discharging provider:  please sign these orders via discharge navigator,  Select New Orders & click on the button choice - Manage This Unsigned Work.     Thank you for allowing pharmacy to be a part of this patient's care.  Jimmy Footman, PharmD, BCPS, BCIDP Infectious Diseases Clinical Pharmacist Phone: [phone] 05/03/2021  8:35 AM  Please check [email or web address] for unit-specific pharmacy phone numbers.

## 2021-04-26 NOTE — Progress Notes (Signed)
[facility] Infectious Diseases Follow Up Note  Patient Identification: Patient Name: [name] MRN: [id number] [place] Date: 04/23/2021  4:57 AM Age: 85 y.o.Today's Date: 04/17/2021   Reason for Visit: MRSA bacteremia  Principal Problem:   Sepsis due to methicillin resistant Staphylococcus aureus (MRSA) ([place]) Active Problems:   History of prostate cancer   Essential hypertension   CAD (coronary artery disease)   Hyperlipidemia, mixed   Lumbar compression fracture (HCC)   Atrial fibrillation with RVR (HCC)   Sternal wound infection   SDH (subdural hematoma)   Pneumonia involving right lung   Delirium   DNR (do not resuscitate)   Goals of care, counseling/discussion   Osteomyelitis of thoracic region [facility])   Protein-calorie malnutrition, severe   Antibiotics: Vancomycin 11/5-current                     Zosyn 11/5- 11/6  Lines/Hardwares:   Interval Events: afebrile,  repeat blood cx 11/7 no growth in 5 days. OR cultures 11/8 MRSA ( final)   Assessment MRSA bacteremia: TTE with no vegetations. Repeat blood cx 11/7 no growth ( final). Refused TEE ( poor TEE and surgical candidate)  Sternal fracture with phlegmon/Osteomyelitis  -     s/p sternal I and D on 11/8. OR cultures growing MRSA -     s/p repeat excisional debridement and wound vac change on 11/10  Recent Falls with SDH (Left convexity subacute and Rt sided chronic) with concerns of acute infarct and hydrocephalus per NeuroSx- no surgical intervention per Neuro SX Encephalopathy/Delirium: He was able to tell me " I am OK today" when asked how are you but did not answer other questions or followed any commands. Possible Pulmonary septic emboli  Thoraco-Lumbar compression fractures vs Osteomyelitis - No surgical intervention per NeuroSX Leukocytosis   Recommendations Continue Vancomycin, pharmacy  to dose. Will not change to daptomycin given pulmonary septic emboli/acute brain infarct and concerns for endocarditis  Plan for 6 weeks course from last OR visit 11/10.  Monitor CBC, BMP and Vancomycin trough ID pharmacy to place OPAT orders  OK to place PICC from ID standpoint  Wound Vac change per CT surgery  Fu discharge planning   Rest of the management as per the primary team. Thank you for the consult. Please page with pertinent questions or concerns.  ______________________________________________________________________ Subjective patient seen and examined at the bedside. He told me he is OK but did not interact anything thereafter  BP (!) 155/68 (BP Location: Right Wrist)   Pulse (!) 114   Temp 98.9 F (37.2 C) (Axillary)   Resp (!) 21   Ht 6' 0.01" (1.829 m)   Wt 93.1 kg   SpO2 98%   BMI 27.83 kg/m     Physical Exam Constitutional:  sitting up in bed with closed eyes    Comments: Dub hoff in place  Cardiovascular:     Rate and Rhythm: Normal rate and regular rhythm.     Heart sounds:  Pulmonary:     Effort: Pulmonary effort is normal.     Comments:   Abdominal:     Palpations: Abdomen is soft.  Tenderness: Non tender and non distended   Musculoskeletal:        General:   Skin:    Comments: wound vac in the anterior chest +  Neurological:     General: unable to assess  Psychiatric:        Mood and Affect:unable to assess  Pertinent Microbiology Results for orders placed or performed during the hospital encounter of 05/01/2021  Culture, blood (routine x 2)     Status: None   Collection Time: 04/19/21  7:55 AM   Specimen: BLOOD LEFT WRIST  Result Value Ref Range Status   Specimen Description BLOOD LEFT WRIST  Final   Special Requests   Final    BOTTLES DRAWN AEROBIC AND ANAEROBIC Blood Culture adequate volume   Culture   Final    NO GROWTH 5 DAYS Performed at [facility], [address], [place], [place] [address]    Report Status  04/24/2021 FINAL  Final  Culture, blood (routine x 2)     Status: None   Collection Time: 04/19/21  8:02 AM   Specimen: BLOOD LEFT HAND  Result Value Ref Range Status   Specimen Description BLOOD LEFT HAND  Final   Special Requests   Final    BOTTLES DRAWN AEROBIC AND ANAEROBIC Blood Culture adequate volume   Culture   Final    NO GROWTH 5 DAYS Performed at [facility], [place] [address] [place], [place] [address]    Report Status 04/24/2021 FINAL  Final  MRSA Next Gen by PCR, Nasal     Status: Abnormal   Collection Time: 04/19/21 11:13 AM   Specimen: Nasal Mucosa; Nasal Swab  Result Value Ref Range Status   MRSA by PCR Next Gen DETECTED (A) NOT DETECTED Final    Comment: RESULT CALLED TO, READ BACK BY AND VERIFIED WITH: D WHITE RN [id number] 04/19/21 A BROWNING (NOTE) The GeneXpert MRSA Assay (FDA approved for NASAL specimens only), is one component of a comprehensive MRSA colonization surveillance program. It is not intended to diagnose MRSA infection nor to guide or monitor treatment for MRSA infections. Test performance is not FDA approved in patients less than 46 years old. Performed at [facility], [place] [address] [place], [place] [address]   Aerobic/Anaerobic Culture w Gram Stain (surgical/deep wound)     Status: None   Collection Time: 04/17/2021  8:23 AM   Specimen: Wound; Body Fluid  Result Value Ref Range Status   Specimen Description WOUND  Final   Special Requests  STERNAL WOUND  Final   Gram Stain   Final    FEW WBC PRESENT,BOTH PMN AND MONONUCLEAR FEW GRAM POSITIVE COCCI IN PAIRS    Culture   Final    MODERATE METHICILLIN RESISTANT STAPHYLOCOCCUS AUREUS NO ANAEROBES ISOLATED Performed at [facility], [place] [address], [place], [place] [address]    Report Status 04/25/2021 FINAL  Final   Organism ID, Bacteria METHICILLIN RESISTANT STAPHYLOCOCCUS AUREUS  Final      Susceptibility   Methicillin resistant staphylococcus aureus - MIC*    CIPROFLOXACIN  >=8 RESISTANT Resistant     ERYTHROMYCIN <=0.25 SENSITIVE Sensitive     GENTAMICIN <=0.5 SENSITIVE Sensitive     OXACILLIN >=4 RESISTANT Resistant     TETRACYCLINE <=1 SENSITIVE Sensitive     VANCOMYCIN 1 SENSITIVE Sensitive     TRIMETH/SULFA <=10 SENSITIVE Sensitive     CLINDAMYCIN <=0.25 SENSITIVE Sensitive     RIFAMPIN <=0.5 SENSITIVE Sensitive  Inducible Clindamycin NEGATIVE Sensitive     * MODERATE METHICILLIN RESISTANT STAPHYLOCOCCUS AUREUS  Aerobic/Anaerobic Culture w Gram Stain (surgical/deep wound)     Status: None   Collection Time: 04/23/2021  8:27 AM   Specimen: PATH Bone biopsy; Tissue  Result Value Ref Range Status   Specimen Description TISSUE  Final   Special Requests  STERNUM  Final   Gram Stain   Final    FEW WBC PRESENT,BOTH PMN AND MONONUCLEAR FEW GRAM POSITIVE COCCI IN PAIRS    Culture   Final    MODERATE METHICILLIN RESISTANT STAPHYLOCOCCUS AUREUS NO ANAEROBES ISOLATED Performed at [facility], [place] [address] [place], [place] [address]    Report Status 04/25/2021 FINAL  Final   Organism ID, Bacteria METHICILLIN RESISTANT STAPHYLOCOCCUS AUREUS  Final      Susceptibility   Methicillin resistant staphylococcus aureus - MIC*    CIPROFLOXACIN >=8 RESISTANT Resistant     ERYTHROMYCIN <=0.25 SENSITIVE Sensitive     GENTAMICIN <=0.5 SENSITIVE Sensitive     OXACILLIN >=4 RESISTANT Resistant     TETRACYCLINE <=1 SENSITIVE Sensitive     VANCOMYCIN 1 SENSITIVE Sensitive     TRIMETH/SULFA <=10 SENSITIVE Sensitive     CLINDAMYCIN <=0.25 SENSITIVE Sensitive     RIFAMPIN <=0.5 SENSITIVE Sensitive     Inducible Clindamycin NEGATIVE Sensitive     * MODERATE METHICILLIN RESISTANT STAPHYLOCOCCUS AUREUS    Pertinent Lab. CBC Latest Ref Rng & Units 04/25/2021 04/25/2021 04/24/2021  WBC 4.0 - 10.5 K/uL 15.8(H) 14.2(H) 14.4(H)  Hemoglobin 13.0 - 17.0 g/dL 8.5(L) 8.6(L) 9.5(L)  Hematocrit 39.0 - 52.0 % 26.2(L) 26.5(L) 28.7(L)  Platelets 150 - 400 K/uL 523(H)  537(H) 540(H)   CMP Latest Ref Rng & Units 05/03/2021 04/25/2021 04/24/2021  Glucose 70 - 99 mg/dL 134(H) 120(H) 142(H)  BUN 8 - 23 mg/dL 18 16 18   Creatinine 0.61 - 1.24 mg/dL 0.58(L) 0.64 0.74  Sodium 135 - 145 mmol/L 138 138 139  Potassium 3.5 - 5.1 mmol/L 3.7 3.4(L) 3.8  Chloride 98 - 111 mmol/L 107 105 108  CO2 22 - 32 mmol/L 25 26 24   Calcium 8.9 - 10.3 mg/dL 8.8(L) 8.5(L) 8.9  Total Protein 6.5 - 8.1 g/dL - - -  Total Bilirubin 0.3 - 1.2 mg/dL - - -  Alkaline Phos 38 - 126 U/L - - -  AST 15 - 41 U/L - - -  ALT 0 - 44 U/L - - -     Pertinent Imaging today Plain films and CT images have been personally visualized and interpreted; radiology reports have been reviewed. Decision making incorporated into the Impression / Recommendations.  I spent more than 40 minutes for this patient encounter including review of prior medical records, coordination of care  with greater than 50% of time being face to face/counseling and discussing diagnostics/treatment plan with the patient/family.  Electronically signed by:   Rosiland Oz, MD Infectious Disease Physician [facility] Infectious [facility] Pager: [phone]

## 2021-04-26 NOTE — Progress Notes (Signed)
     Chart reviewed and updates received from my colleague Kathie Rhodes NP. Patient assessed and is unable to engage appropriately in discussions. Attempted to contact patient's wife [name]. Unable to reach. Voicemail left with contact information given.   PMT then received a return call and voicemail on team line from patient's wife. Attempted to contact again, but unsuccessful. Will re-attempt to contact family at a later time/date.   Dorthy Cooler, [facility] Palliative Medicine Team  Team Phone # [phone]   NO CHARGE

## 2021-04-27 DIAGNOSIS — E43 Unspecified severe protein-calorie malnutrition: Secondary | ICD-10-CM

## 2021-04-27 DIAGNOSIS — S065XAA Traumatic subdural hemorrhage with loss of consciousness status unknown, initial encounter: Secondary | ICD-10-CM | POA: Diagnosis not present

## 2021-04-27 DIAGNOSIS — M869 Osteomyelitis, unspecified: Secondary | ICD-10-CM | POA: Diagnosis not present

## 2021-04-27 DIAGNOSIS — A4102 Sepsis due to Methicillin resistant Staphylococcus aureus: Secondary | ICD-10-CM | POA: Diagnosis not present

## 2021-04-27 DIAGNOSIS — R410 Disorientation, unspecified: Secondary | ICD-10-CM | POA: Diagnosis not present

## 2021-04-27 DIAGNOSIS — I4891 Unspecified atrial fibrillation: Secondary | ICD-10-CM | POA: Diagnosis not present

## 2021-04-27 DIAGNOSIS — S21101A Unspecified open wound of right front wall of thorax without penetration into thoracic cavity, initial encounter: Secondary | ICD-10-CM | POA: Diagnosis not present

## 2021-04-27 LAB — GLUCOSE, CAPILLARY
Glucose-Capillary: 109 mg/dL — ABNORMAL HIGH (ref 70–99)
Glucose-Capillary: 112 mg/dL — ABNORMAL HIGH (ref 70–99)
Glucose-Capillary: 120 mg/dL — ABNORMAL HIGH (ref 70–99)
Glucose-Capillary: 120 mg/dL — ABNORMAL HIGH (ref 70–99)
Glucose-Capillary: 121 mg/dL — ABNORMAL HIGH (ref 70–99)
Glucose-Capillary: 121 mg/dL — ABNORMAL HIGH (ref 70–99)
Glucose-Capillary: 133 mg/dL — ABNORMAL HIGH (ref 70–99)

## 2021-04-27 LAB — VANCOMYCIN, TROUGH: Vancomycin Tr: 12 ug/mL — ABNORMAL LOW (ref 15–20)

## 2021-04-27 MED ORDER — VANCOMYCIN HCL 1000 MG/200ML IV SOLN
1000.0000 mg | INTRAVENOUS | Status: DC
Start: 2021-04-28 — End: 2021-05-03
  Administered 2021-04-28 – 2021-05-03 (×6): 1000 mg via INTRAVENOUS
  Filled 2021-04-27 (×6): qty 200

## 2021-04-27 NOTE — Progress Notes (Signed)
      [address]       [place], [address]             [phone]      Patient premedicated with Morphine prior to wound vac dressing change.  He remains unable to converse.  The patient developed a skin tear from the wound vac dressing removal.  This was treated with a small mepilex dressing.  Wound: the wound is growing granulation tissue.  The wound extends below the sternum with pericardium/heart exposure.  A white sponge was placed over this area then covered with a black sponge.  4 sponges total were used.  Unfortunately I do not think this patient is appropriate for home wound vac changes.  I also do not suspect a SNF would be willing to perform these vac changes with the patient heart/pericardium exposed.  He also most certainly is not in a medical state to be transported to our office for wound vac changes.  The patient is being worked up by Palliative care.  He may meet qualifications for hospice care, not sure if they would provide wound vac changes, or if patient would need to undergo wet to dry dressing changes.  Care per primary  Ellwood Handler, PA-C

## 2021-04-27 NOTE — Progress Notes (Signed)
Daily Progress Note   Patient Name: [name]       Date: 04/27/2021 DOB: Sep 30, 1935  Age: 85 y.o. MRN#: [id number] Attending Physician: Bonnielee Haff, MD Primary Care Physician: [name], DO Admit Date: 05/11/2021  Reason for Consultation/Follow-up: Establishing goals of care  Subjective: Medical records reviewed. Patient assessed. Called patient's wife [name] to provide updates, support, and follow up on goals of care. She tells me she visited yesterday and feels that he is looking better.   Reviewed challenges in managing patient's mediastinal wound and shared that unfortunately, he remains very lethargic and has not been making progress with PT/OT. [name] states "well that's good" and I clarified that patient is struggling to participate or make meaningful steps towards recovery. She tells me this is because he is at the hospital and then focuses on his ability to briefly answer her questions yesterday and his "color" improving. She remains very determined to continue aggressive care and eventually discharge patient back home with [facility]. Created space for her thoughts on a safe discharge plan, as she was initially persistent about her preference for him to discharge yesterday. Counseled on the importance of a safe plan and she agrees, she would not want him to go home and then come right back as he does poorly with changes to his environment. She feels it would be best if he stayed hospitalized until 11/30.  Questions and concerns addressed. PMT will continue to support holistically.  Length of Stay: 9  Current Medications: Scheduled Meds:   acetaminophen  1,000 mg Per Tube Q6H   azelastine  1 spray Each Nare BID   chlorhexidine  15 mL Mouth Rinse BID   Chlorhexidine Gluconate Cloth   6 each Topical Daily   diltiazem  60 mg Per Tube Q6H   docusate  100 mg Per Tube BID   feeding supplement (PROSource TF)  45 mL Per Tube BID   mouth rinse  15 mL Mouth Rinse q12n4p   sodium chloride flush  10-40 mL Intracatheter Q12H   sodium chloride flush  3 mL Intravenous Q12H    Continuous Infusions:  0.45 % NaCl with KCl 20 mEq / L 10 mL/hr at 04/27/21 0230   feeding supplement (JEVITY 1.5 CAL/FIBER) 1,000 mL (04/27/21 0802)   vancomycin 1,250 mg (04/27/21 1011)    PRN Meds: bisacodyl, haloperidol lactate, morphine injection, ondansetron **OR** ondansetron (ZOFRAN) IV, polyethylene glycol, sodium chloride flush, traMADol  Physical Exam Vitals and nursing note reviewed.  Constitutional:      General: He is not in acute distress. Pulmonary:     Effort: Pulmonary effort is normal.  Abdominal:     Comments: Cortrak in place  Skin:    General: Skin is warm and dry.  Neurological:     Mental Status: He is lethargic and disoriented.            Vital Signs: BP 131/80 (BP Location: Left Arm)   Pulse 92   Temp 97.7 F (36.5 C) (Axillary)   Resp 15   Ht 6' 0.01" (1.829 m)   Wt 89.2 kg   SpO2 98%   BMI 26.66 kg/m  SpO2: SpO2: 98 % O2 Device: O2 Device:  Room Air O2 Flow Rate: O2 Flow Rate (L/min): 2 L/min  Intake/output summary:  Intake/Output Summary (Last 24 hours) at 04/27/2021 1030 Last data filed at 04/27/2021 0530 Gross per 24 hour  Intake --  Output 1300 ml  Net -1300 ml    LBM: Last BM Date: 04/25/21 Baseline Weight: Weight: 89.4 kg Most recent weight: Weight: 89.2 kg       Palliative Assessment/Data: PPS 30% on tube feeds - 10% without    Flowsheet Rows    Flowsheet Row Most Recent Value  Intake Tab   Referral Department --  [CT surgery]  Unit at Time of Referral Intermediate Care Unit  Palliative Care Primary Diagnosis Sepsis/Infectious Disease  Date Notified 04/19/2021  Palliative Care Type New Palliative care  Reason for referral Clarify  Goals of Care  Date of Admission 04/14/2021  Date first seen by Palliative Care 04/28/2021  # of days Palliative referral response time 0 Day(s)  # of days IP prior to Palliative referral 0  Clinical Assessment   Psychosocial & Spiritual Assessment   Palliative Care Outcomes        Patient Active Problem List   Diagnosis Date Noted   Sternal osteomyelitis ([place])    Acute septic pulmonary embolism ([place])    Protein-calorie malnutrition, severe 04/21/2021   Osteomyelitis of thoracic region [facility])    Sternal wound infection 05/08/2021   SDH (subdural hematoma) 05/05/2021   Pneumonia involving right lung 05/03/2021   Delirium 04/25/2021   DNR (do not resuscitate) 04/15/2021   Goals of care, counseling/discussion    Atrial fibrillation with RVR ([place]) 04/17/2021   DDD (degenerative disc disease), lumbar 03/03/2021   Cellulitis 02/18/2021   Lumbar compression fracture ([place]) 02/18/2021   Compression fracture of first lumbar vertebra ([place]) 02/17/2021   Cellulitis and abscess of foot    MRSA bacteremia 08/10/2020   Sepsis due to methicillin resistant Staphylococcus aureus (MRSA) ([place]) 04/24/2020   Cellulitis of knee, left 04/22/2020   Hypokalemia 04/22/2020   Osteoarthritis of multiple joints 12/15/2017   Hyponatremia 03/28/2017   Abnormality of gait and mobility 01/11/2017   Cloudy vision 01/11/2017   Foot drop, bilateral 01/11/2017   Amputee, great toe, left ([place]) 01/11/2017   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   Macrocytic anemia 09/22/2015   Moderate tricuspid insufficiency 04/21/2015   History of prostate cancer 04/16/2015   Idiopathic peripheral neuropathy 04/16/2015   Essential hypertension 04/16/2015   GERD (gastroesophageal reflux disease) 04/16/2015   Arthritis 04/16/2015   Bilateral lower extremity edema 04/16/2015   Benign essential tremor 10/30/2014   Hyperlipidemia, mixed 20/25/4270   Chronic systolic CHF (congestive heart failure), NYHA class 2  ([place]) 04/23/2014   Mitral insufficiency 11/05/2013   CAD (coronary artery disease) 11/01/2013   Basal cell carcinoma of skin of other parts of face 03/14/2013   Ankle sprain 09/09/2011   History of fibula fracture 09/09/2011    Palliative Care Assessment & Plan   HPI: 85 y.o. male with past medical history of HTN; HLD; remote prostate CA; CAD s/p remote CABG; dementia, recurrent falls, and abdominal wall melanoma admitted on 04/14/2021 with sepsis from [facility] - new onset fever, respiratory distress after discharging from from [facility] yesterday.   Patient admitted to [facility] from 10/29-11/4 after presenting with a fall with LOC.  He was found to have an acute SDH. At [facility] ED, ED scan showed mediastinum fluid collection with gas, with a transverse fracture of sternum. PMT has been consulted to assist with goals  of care conversation.   Assessment: Anterior mediastinal abscess MRSA bacteremia Goals of care conversation  Recommendations/Plan: Continue current care; patient's wife remains very committed to aggressive care Patient's wife continues to desire discharge home when medically stable, understands he is not at this time PMT will continue to follow peripherally, available acutely on an as-needed basis. May not see daily as goals are clear. Please secure chat with urgent needs  Code Status: DNR  Care plan was discussed with wife, Dr. [place] Pink, [facility] team  Total time: 25 minutes Greater than 50% of this time was spent in counseling and coordinating care related to the above assessment and plan.  Dorthy Cooler, PA-C Palliative Medicine Team Team phone # [phone]  Thank you for allowing the Palliative Medicine Team to assist in the care of this patient. Please utilize secure chat with additional questions, if there is no response within 30 minutes please call the above phone number.  Palliative Medicine Team providers are available by phone from 7am to 7pm daily and can be reached  through the team cell phone.  Should this patient require assistance outside of these hours, please call the patient's attending physician.

## 2021-04-27 NOTE — TOC Progression Note (Addendum)
Transition of Care (TOC) - Progression Note  Marvetta Gibbons RN, BSN Transitions of Care Unit 4E- RN Case Manager See Treatment Team for direct phone #    Patient Details  Name: [place] Pettengill MRN: [id number] Date of Birth: [birth year]  Transition of Care [facility] CM/SW Contact  Dahlia Client Romeo Rabon, RN Phone Number: 04/27/2021, 3:05 PM  Clinical Narrative:    Msg received from [facility] with Palliative. "I just received a call from Nancy/wife about this patient, I know from the call that she told Josseline this morning that she did not want the patient to leave the hospital until 11/30 but she said that things have changed at their home and she now will have someone in the home to help her for the remainder of November. She does not want him going anywhere besides home." Wife requesting a return call from someone.    Call made to Wife- [name] to discuss transition plans and needs- acknowledged with wife her desire to bring patient home, however explained to wife that pt currently was not medically stable to come home with current wound care needs. Explained to wife that patient needed IV pain meds for bedside dressing change that was done by doctor this morning, and that currently the needed wound care could not be managed at home safely. Confirmed with wife that she had [place] prior to admit with [facility]- wife states she would like to resume [facility] services them on return home. Further explained to wife that we would need clearance from the doctors and the surgeon that patient could be safely managed at home for his wound care needs and that a wound VAC for home would need to be arranged as well as confirming with [facility] that they could manage the wound care in the home. Wife voiced understanding at this time. Wife again stated that she has the needed help arranged for the rest of Nov- explained to wife again that pt would likely need to stay in the hospital and would likely not be ready to return home before  11/30 and maybe even later than that - the main thing we needed to be sure was that he could be safely managed for all his needs when he returns home and that all arrangements for wound care, IV abx and home needs were in place.  This Probation officer assured wife that we would stay in touch regarding transition plans.   Wife also expressed that she still need hoyer lift at home, one had been arranged but she had told agency to hold on delivery since pt was in the hospital- wife requesting assistance in having lift delivered prior to return home- per wife she has all other needed DME-  (provider for lift was Home care specialist- per wife- will see if we can confirm lift arrangements)  Call made to [facility] [phone]- confirmed that they do have a hoyer lift on hold for delivery- requested that they reach back out to wife to schedule delivery to the home- Konica to f/u with wife.    Expected Discharge Plan: [place] Barriers to Discharge: Continued Medical Work up  Expected Discharge Plan and Services Expected Discharge Plan: [place] In-house Referral: Clinical Social Work Discharge Planning Services: CM Consult Post Acute Care Choice: Home Health, Resumption of Svcs/PTA Provider (Currently active with [place]) Living arrangements for the past 2 months: [place]  HH Arranged: RN, Disease Management, OT, PT, Social Work [place]: [place] ([place]) Date [place]: 04/24/21 Time [place]: 1518 Representative spoke with at [place]: Corene Cornea   Social Determinants of Health ([place]) Interventions    Readmission Risk Interventions No flowsheet data found.

## 2021-04-27 NOTE — Progress Notes (Signed)
Nutrition Follow-up  DOCUMENTATION CODES:   Severe malnutrition in context of chronic illness  INTERVENTION:    Continue TF via Cortrak: Jevity 1.5 at 65 ml/h  Prosource TF 45 ml BID  Provides 2420 kcal, 122 gm protein, 1186 ml free water daily  NUTRITION DIAGNOSIS:   Severe Malnutrition related to chronic illness (CAD) as evidenced by severe fat depletion, severe muscle depletion.  GOAL:   Patient will meet greater than or equal to 90% of their needs  MONITOR:   PO intake, Supplement acceptance, Labs, Skin  REASON FOR ASSESSMENT:   Malnutrition Screening Tool    ASSESSMENT:   85 yo male admitted with severe sepsis, MRSA bacteremia, sternal wound infection. PMH includes HTN, HLD, remote prostate CA, CAD s/p remote CABG, abd wall melanoma, DDD, lumbar compression fracture, L great toe amputation.  Patient lying in bed. Did not speak to RD during visit. No family at bedside at this time.   Patient remains on a dysphagia 1 diet with thin liquids.  He is eating 0-10% of meals.  Cortrak in place. Receiving Jevity 1.2 at 65 ml/h with Prosource TF 45 ml BID. Tolerating well.   S/P sternal wound debridement on 11/10. VAC in place to chest wound. VAC dressing was changed this morning. Wound extends below the sternum with pericardium/heart exposure.   Palliative Care team following. Prognosis is poor.  Wife wants patient to continue receiving aggressive medical therapy, but she does not want him to go home on tube feedings.   Labs and medications reviewed. CBG: [phone]  Admission weight 89.4 kg Current weight 89.2 kg  Diet Order:   Diet Order             DIET - DYS 1 Room service appropriate? Yes; Fluid consistency: Thin  Diet effective now                   EDUCATION NEEDS:   No education needs have been identified at this time  Skin:  Skin Assessment: Skin Integrity Issues: Skin Integrity Issues:: Wound VAC Wound Vac: sternal wound with  pericardium/heart exposure  Last BM:  11/13  Height:   Ht Readings from Last 1 Encounters:  05/07/2021 6' 0.01" (1.829 m)    Weight:   Wt Readings from Last 1 Encounters:  04/27/21 89.2 kg    BMI:  Body mass index is 26.66 kg/m.  Estimated Nutritional Needs:   Kcal:  2200-2400  Protein:  120-130 gm  Fluid:  2.2-2.4 L    Lucas Mallow, RD, LDN, CNSC Please refer to Amion for contact information.

## 2021-04-27 NOTE — Progress Notes (Signed)
Pharmacy Antibiotic Note  [name] is a 85 y.o. male admitted on 05/01/2021 with MRSA bacteremia/sternal osteo and possible septic pulmonary emboli. He is s/p debridement of the sternum on 11/8 with repeat wound washout on 11/10. Scr is stable at <1. Levels collected yesterday and today show a trough of 12 and a peak of 40 for an AUC of 555, which is at the higher end the goal, so it is reasonable to empirically decrease to avoid accumulation in this patient.  Plan: Decrease vancomycin to 1000 mg every 24 hours - Estimated AUC 445 Monitor renal function Levels as indicated  Height: 6' 0.01" (182.9 cm) Weight: 89.2 kg (196 lb 10.4 oz) IBW/kg (Calculated) : 77.62  Temp (24hrs), Avg:98 F (36.7 C), Min:97.7 F (36.5 C), Max:98.2 F (36.8 C)  Recent Labs  Lab 04/17/2021 0540 04/23/2021 1240 04/23/21 0614 04/24/21 0236 04/25/21 0329 04/19/2021 0125 04/17/2021 1238 04/27/21 0800  WBC 12.7*  --  14.3* 14.4* 14.2* 15.8*  --   --   CREATININE 0.70  --  0.70 0.74 0.64 0.58*  --   --   VANCOTROUGH 20  --   --   --   --   --   --  12*  VANCOPEAK  --  39  --   --   --   --  40  --      Estimated Creatinine Clearance: 76.8 mL/min (A) (by C-G formula based on SCr of 0.58 mg/dL (L)).    Allergies  Allergen Reactions   Codeine Nausea And Vomiting   Indocin [Indomethacin] Other (See Comments)   Latex Other (See Comments)    Blisters   Mupirocin     Blisters   Plavix [Clopidogrel Bisulfate]     GI intolerance, n/v   Polysporin [Bacitracin-Polymyxin B]     Blisters   Shellfish Allergy Nausea And Vomiting    Mussels   Tape    Neosporin [Neomycin-Bacitracin Zn-Polymyx] Rash   Other Nausea And Vomiting    mussels    Antimicrobials this admission: Zosyn 11/6 >> 11/7 Vancomcyin 11/6 >>   Dose adjustments this admission: Vancomycin 1250 mg every 24 hours >> vancomycin 1000 mg every 24 hours on 11/15  Microbiology results: 11/5 BCx: 4/4 staph aureus w/ MecA/C and MREJ resistance   11/5 UCx: mult sp  11/7 BCx: ngtd 11/8 Sternal tissue: MRSA 11/8: Sternal wound fluid: MRSA  Thank you for allowing pharmacy to be a part of this patient's care.  Zenaida Deed, PharmD PGY1 Acute Care Pharmacy Resident  Phone: [phone] 04/27/2021  12:44 PM  Please check [email or web address] for unit-specific pharmacy phone numbers.

## 2021-04-27 NOTE — Progress Notes (Signed)
Since pt is on max scheduled dose APAP, ok to dc PRN order to avoid overdosing per Dr. [place] Pink.  Onnie Boer, PharmD, BCIDP, AAHIVP, CPP Infectious Disease Pharmacist 04/27/2021 8:49 AM

## 2021-04-27 NOTE — Progress Notes (Signed)
PROGRESS NOTE    [name]   EZM:[id number]  DOB: 09-08-1935  PCP: [name], DO    DOA: 04/17/2021 LOS: 9    Brief Narrative / Hospital Course to Date:   85 y.o. male with medical history significant of HTN; HLD; remote prostate CA; CAD s/p remote CABG; and abdominal wall melanoma presenting to [facility] on 04/17/21 with sepsis, presenting with fever, encephalopathy, respiratory distress and A-fib RVR.  Transferred to [facility] on 04/21/2021 for CT surgery coverage due to a mediastinal fluid collection associated with transverse sternal fracture.  Patient found to have Staph bacteremia with findings of pulmonary septic emboli on CT scan.  Infectious disease, CT surgery, neurosurgery, palliative care consulted.  Patient's wife has not been willing to de-escalate care.  Cardiothoracic continues to follow for chest wound.  He remains on NG tube feedings through cortrak.  Disposition will be challenging.   Assessment & Plan    MRSA bacteremia with severe sepsis, present on admission Sepsis POA with leukocytosis, tachycardia, tachypnea, hypoxia.  Evidence of acute organ failure with elevated lactate >2 that is not easily explained by another condition. Blood cultures were positive for MRSA. Septic emboli appreciated on imaging. Suspected source is unknown - PNA, spinal osteo, recent skin surgery are considerations (see below) Infectious disease was consulted.  Patient remains on vancomycin. Transthoracic echocardiogram showed normal systolic function.  No evidence for vegetation was noted. TEE was considered but wife does not want him to have this procedure plus per cardiothoracic surgery this may not be necessary as it would not change management.   Patient needs long-term IV antibiotics anyway.  PICC line was placed yesterday.   Remains afebrile.  Recheck labs tomorrow.  Anterior mediastinal abscess/phlegmon  Mediastinal fluid collection with gas/fluid with transverse  sternal fracture.  Cardiothoracic surgery was consulted. Patient taken for I&D on 11/8 and wound VAC placed. Underwent a wound VAC change in the OR on 11/10. Seen by cardiothoracic surgery this morning.  Wound VAC was changed at bedside.  There is exposure of pericardium/heart.  They do not think the patient is a candidate for wound VAC changes and dressing changes at home by home health.   Subdural Hematoma (SDH) Patient has had recent SDH x 2, acute on chronic, seen at [facility].  This was secondary to multiple falls. Also with compression fractures, ?osteomyelitis.  Anticoagulation and ASA are contraindicated at this time.  New onset A-fib - POA, Likely triggered by infection. Anticoagulation contraindicated in setting of SDH. Was on Cardizem infusion which was changed over to enteral Cardizem through his NG tube.   Heart rate was noted to be poorly controlled yesterday.  Cardizem dose was increased.  Noted to be better this morning.  Continue to monitor.    Delirium/acute metabolic encephalopathy Most likely due to infection, unclear if recent traumatic SDH contributing.  Hold sedating meds: Primidone, benadryl (consider d/c), Neurontin Patient's mentation had improved but again noted to be lethargic today.  Continue to monitor closely.  No focal deficits noted.   Will check ammonia level tomorrow since he did have abnormal LFTs a few days ago.  Also check his LFTs tomorrow.  Right midlung PNA vs Septic Emboli  Appears to have septic emboli on imaging. Treated with Vanc/Zosyn on admission. Zosyn d/c'd per ID Continue Vancomycin Respiratory status is stable.  He is noted to be on oxygen by nasal cannula 2 L/min.  Hypokalemia  Potassium repleted.   Oropharyngeal dysphagia Seen by speech therapy.  Now  on dysphagia 1 diet.  Oral intake is poor.  Plan is to continue with tube feedings for now.  Nutrition Due to extremely poor oral intake cortrak feeding tube was placed.  Wife does not want  him to come home with a feeding tube.  Continue tube feedings for now.  Thoracolumbar compression fractures with ? Osteomyelitis  No surgical intervention planned.  Seen by neurosurgery.  On antibiotics per ID.   Abdominal wall melanoma Has steri-strips in place, no obvious infection.  Path reports from excision reflect negative margins.   Left AC superficial thrombophlebitis  Conservative management.  No DVT is noted on imaging studies.  Macrocytic anemia Drop in hemoglobin likely dilutional.  Hemoglobin is stable for the most part.  No overt bleeding noted.   Anemia panel reviewed.  No clear-cut deficiencies identified.   TSH is noted to be 1.2 with a normal free T4.   Essential hypertension  Currently on Cardizem.  Blood pressure is stable.   Hyperlipidemia Resume statin at discharge.  Remote Prostate CA  Bladder wall thickening noted on imaging.  Monitor for now.  Outpatient follow-up with urology.  Has a Foley catheter which will remain in place at discharge.   Coronary artery disease s/p remote CABG   Goals of care Palliative care has been consulted since patient's prognosis appears to be poor.  They have had conversations with patient's wife.  Patient has a son who lives out of town.    Multiple discussions held with patient's wife over the last few days.  She was very keen on him coming home on Monday.  This was not practically possible.  She got very upset when she heard this and hung up on me.  Now patient's wife mentioned to palliative care that she will be unable to take care of him at home but also is reluctant for him to go to skilled nursing facility.  It does not appear that she fully understands the seriousness of the situation.  Patient's prognosis remains poor.  He seems to be gradually declining. She wants him to continue getting aggressive medical therapy.  At this time patient is unsafe for discharge to either home or even to skilled nursing facility.  DVT  prophylaxis: SCDs CODE STATUS: DNR Family communication: No family at bedside today.   Disposition: To be determined  Status is: Inpatient  Remains inpatient appropriate because: Need for IV antibiotics and further surgical debridement     Subjective   Lethargic but arousable.  Confused.  No distress.   Consults, Procedures, Significant Events   Consultants:  Infectious disease Palliative care Cardiothoracic surgery Neurosurgery  Procedures:  Irrigation and drainage of the mediastinal abscess.  Antimicrobials:  Anti-infectives (From admission, onward)    Start     Dose/Rate Route Frequency Ordered Stop   04/23/21 0830  vancomycin (VANCOREADY) IVPB 1250 mg/250 mL        1,250 mg 166.7 mL/hr over 90 Minutes Intravenous Every 24 hours 04/14/2021 1433     04/19/21 0830  vancomycin (VANCOREADY) IVPB 1750 mg/350 mL  Status:  Discontinued        1,750 mg 175 mL/hr over 120 Minutes Intravenous Every 24 hours 04/19/21 0742 05/03/2021 1433   04/28/2021 2000  vancomycin (VANCOREADY) IVPB 1750 mg/350 mL  Status:  Discontinued        1,750 mg 175 mL/hr over 120 Minutes Intravenous Every 24 hours 04/29/2021 1007 04/25/2021 1706   04/16/2021 1400  piperacillin-tazobactam (ZOSYN) IVPB 3.375 g  Status:  Discontinued  3.375 g 12.5 mL/hr over 240 Minutes Intravenous Every 8 hours 04/27/2021 1007 04/19/21 0742         Micro    Objective   Vitals:   05/07/2021 2004 05/07/2021 2321 04/27/21 0331 04/27/21 0756  BP: (!) 122/108 (!) 133/95 (!) 127/109 131/80  Pulse: (!) 108 (!) 103 86 92  Resp: 20 20 20 15   Temp: 97.9 F (36.6 C) 98 F (36.7 C) 98.2 F (36.8 C) 97.7 F (36.5 C)  TempSrc: Axillary Axillary Axillary Axillary  SpO2: 100% 98% 98% 98%  Weight:   89.2 kg   Height:        Intake/Output Summary (Last 24 hours) at 04/27/2021 1113 Last data filed at 04/27/2021 0530 Gross per 24 hour  Intake --  Output 1300 ml  Net -1300 ml    Filed Weights   04/23/21 0331 04/24/21  0409 04/27/21 0331  Weight: 88.7 kg 93.1 kg 89.2 kg    Physical Exam:  General appearance: Lethargic.  No distress Resp: Mildly tachypneic.  Coarse breath sounds bilaterally.  No wheezing or rhonchi.  Few crackles at the bases. Cardio: S1-S2 is irregularly irregular GI: Abdomen is soft.  Nontender nondistended.  Bowel sounds are present normal.  No masses organomegaly Extremities: No edema.   Neurologic: No focal neurological deficits.     Labs   Data Reviewed: I have personally reviewed following labs and imaging studies  CBC: Recent Labs  Lab 04/23/2021 0540 04/23/21 0614 04/24/21 0236 04/25/21 0329 04/23/2021 0125  WBC 12.7* 14.3* 14.4* 14.2* 15.8*  HGB 8.8* 8.5* 9.5* 8.6* 8.5*  HCT 26.1* 25.4* 28.7* 26.5* 26.2*  MCV 102.4* 103.3* 104.7* 104.7* 105.6*  PLT 582* 601* 540* 537* 523*    Basic Metabolic Panel: Recent Labs  Lab 04/21/21 0210 04/21/21 1602 04/28/2021 0540 04/16/2021 1613 04/23/21 0614 04/24/21 0236 04/25/21 0329 04/19/2021 0125  NA 140  --  141  --  138 139 138 138  K 3.8  --  2.8*  --  3.3* 3.8 3.4* 3.7  CL 111  --  108  --  108 108 105 107  CO2 18*  --  24  --  24 24 26 25   GLUCOSE 141*  --  124*  --  146* 142* 120* 134*  BUN 22  --  23  --  17 18 16 18   CREATININE 0.90  --  0.70  --  0.70 0.74 0.64 0.58*  CALCIUM 9.3  --  9.4  --  8.9 8.9 8.5* 8.8*  MG 1.8 1.8 1.8 1.6* 2.4  --   --   --   PHOS  --  3.2 3.4 3.5 3.7  --   --   --     GFR: Estimated Creatinine Clearance: 76.8 mL/min (A) (by C-G formula based on SCr of 0.58 mg/dL (L)).   CBG: Recent Labs  Lab 05/08/2021 1625 05/02/2021 2006 04/27/21 0005 04/27/21 0353 04/27/21 0834  GLUCAP 125* 137* 133* 121* 112*    Sepsis Labs: Recent Labs  Lab 04/21/21 0210  PROCALCITON 0.77     Recent Results (from the past 240 hour(s))  Culture, blood (routine x 2)     Status: None   Collection Time: 04/19/21  7:55 AM   Specimen: BLOOD LEFT WRIST  Result Value Ref Range Status   Specimen  Description BLOOD LEFT WRIST  Final   Special Requests   Final    BOTTLES DRAWN AEROBIC AND ANAEROBIC Blood Culture adequate volume   Culture  Final    NO GROWTH 5 DAYS Performed at [facility], [place] [address], [place], [place] [address]    Report Status 04/24/2021 FINAL  Final  Culture, blood (routine x 2)     Status: None   Collection Time: 04/19/21  8:02 AM   Specimen: BLOOD LEFT HAND  Result Value Ref Range Status   Specimen Description BLOOD LEFT HAND  Final   Special Requests   Final    BOTTLES DRAWN AEROBIC AND ANAEROBIC Blood Culture adequate volume   Culture   Final    NO GROWTH 5 DAYS Performed at [facility], [place] [address], [place], [place] [address]    Report Status 04/24/2021 FINAL  Final  MRSA Next Gen by PCR, Nasal     Status: Abnormal   Collection Time: 04/19/21 11:13 AM   Specimen: Nasal Mucosa; Nasal Swab  Result Value Ref Range Status   MRSA by PCR Next Gen DETECTED (A) NOT DETECTED Final    Comment: RESULT CALLED TO, READ BACK BY AND VERIFIED WITH: D WHITE RN 1503 04/19/21 A BROWNING (NOTE) The GeneXpert MRSA Assay (FDA approved for NASAL specimens only), is one component of a comprehensive MRSA colonization surveillance program. It is not intended to diagnose MRSA infection nor to guide or monitor treatment for MRSA infections. Test performance is not FDA approved in patients less than 29 years old. Performed at [facility], [place] [address] [place], [place] [address]   Aerobic/Anaerobic Culture w Gram Stain (surgical/deep wound)     Status: None   Collection Time: 04/14/2021  8:23 AM   Specimen: Wound; Body Fluid  Result Value Ref Range Status   Specimen Description WOUND  Final   Special Requests  STERNAL WOUND  Final   Gram Stain   Final    FEW WBC PRESENT,BOTH PMN AND MONONUCLEAR FEW GRAM POSITIVE COCCI IN PAIRS    Culture   Final    MODERATE METHICILLIN RESISTANT STAPHYLOCOCCUS AUREUS NO ANAEROBES ISOLATED Performed at  [facility], [place] [address], [place], [place] [address]    Report Status 04/25/2021 FINAL  Final   Organism ID, Bacteria METHICILLIN RESISTANT STAPHYLOCOCCUS AUREUS  Final      Susceptibility   Methicillin resistant staphylococcus aureus - MIC*    CIPROFLOXACIN >=8 RESISTANT Resistant     ERYTHROMYCIN <=0.25 SENSITIVE Sensitive     GENTAMICIN <=0.5 SENSITIVE Sensitive     OXACILLIN >=4 RESISTANT Resistant     TETRACYCLINE <=1 SENSITIVE Sensitive     VANCOMYCIN 1 SENSITIVE Sensitive     TRIMETH/SULFA <=10 SENSITIVE Sensitive     CLINDAMYCIN <=0.25 SENSITIVE Sensitive     RIFAMPIN <=0.5 SENSITIVE Sensitive     Inducible Clindamycin NEGATIVE Sensitive     * MODERATE METHICILLIN RESISTANT STAPHYLOCOCCUS AUREUS  Aerobic/Anaerobic Culture w Gram Stain (surgical/deep wound)     Status: None   Collection Time: 04/17/2021  8:27 AM   Specimen: PATH Bone biopsy; Tissue  Result Value Ref Range Status   Specimen Description TISSUE  Final   Special Requests  STERNUM  Final   Gram Stain   Final    FEW WBC PRESENT,BOTH PMN AND MONONUCLEAR FEW GRAM POSITIVE COCCI IN PAIRS    Culture   Final    MODERATE METHICILLIN RESISTANT STAPHYLOCOCCUS AUREUS NO ANAEROBES ISOLATED Performed at [facility], [place] [address], [place], [place] [address]    Report Status 04/25/2021 FINAL  Final   Organism ID, Bacteria METHICILLIN  RESISTANT STAPHYLOCOCCUS AUREUS  Final      Susceptibility   Methicillin resistant staphylococcus aureus - MIC*    CIPROFLOXACIN >=8 RESISTANT Resistant     ERYTHROMYCIN <=0.25 SENSITIVE Sensitive     GENTAMICIN <=0.5 SENSITIVE Sensitive     OXACILLIN >=4 RESISTANT Resistant     TETRACYCLINE <=1 SENSITIVE Sensitive     VANCOMYCIN 1 SENSITIVE Sensitive     TRIMETH/SULFA <=10 SENSITIVE Sensitive     CLINDAMYCIN <=0.25 SENSITIVE Sensitive     RIFAMPIN <=0.5 SENSITIVE Sensitive     Inducible Clindamycin NEGATIVE Sensitive     * MODERATE METHICILLIN RESISTANT STAPHYLOCOCCUS  AUREUS       Imaging Studies   [place] EKG SITE RITE  Result Date: 04/25/2021 If Site Rite image not attached, placement could not be confirmed due to current cardiac rhythm.    Medications   Scheduled Meds:  acetaminophen  1,000 mg Per Tube Q6H   azelastine  1 spray Each Nare BID   chlorhexidine  15 mL Mouth Rinse BID   Chlorhexidine Gluconate Cloth  6 each Topical Daily   diltiazem  60 mg Per Tube Q6H   docusate  100 mg Per Tube BID   feeding supplement (PROSource TF)  45 mL Per Tube BID   mouth rinse  15 mL Mouth Rinse q12n4p   sodium chloride flush  10-40 mL Intracatheter Q12H   sodium chloride flush  3 mL Intravenous Q12H   Continuous Infusions:  0.45 % NaCl with KCl 20 mEq / L 10 mL/hr at 04/27/21 0230   feeding supplement (JEVITY 1.5 CAL/FIBER) 1,000 mL (04/27/21 0802)   vancomycin 1,250 mg (04/27/21 1011)       LOS: 9 days    Bonnielee Haff,  [facility]  04/27/2021, 11:13 AM

## 2021-04-27 NOTE — Progress Notes (Signed)
PHARMACY CONSULT NOTE FOR:  OUTPATIENT  PARENTERAL ANTIBIOTIC THERAPY (OPAT)  Indication: osteomyelitis/BSI Regimen: vancomycin 1000 mg IV every 24 hours End date: 06/03/2021  IV antibiotic discharge orders are pended. To discharging provider:  please sign these orders via discharge navigator,  Select New Orders & click on the button choice - Manage This Unsigned Work.     Thank you for allowing pharmacy to be a part of this patient's care.  Zenaida Deed, PharmD PGY1 Acute Care Pharmacy Resident  Phone: [phone] 04/27/2021  12:42 PM  Please check [email or web address] for unit-specific pharmacy phone numbers.

## 2021-04-27 NOTE — Progress Notes (Signed)
Occupational Therapy Treatment Patient Details Name: [name] MRN: [id number] DOB: [birth year] Today's Date: 04/27/2021   History of present illness Pt is an 85 yo male presenting 04/17/21 with AMS after being d/c home on 11/4 from [facility] where he was admitted for fall with LOC and SDH. Chest CT revealed large mediastinum fluid collection with gas, with a transverse fx of sternum. S/p sternal I&D and wound vac placement on 11/8; plan for repeat debridement on 11/10. S/p cortrak placement 11/9. PMH includes frequent falls, polypharmacy, neuropathy, dementia, chronic hyponatremia, HTN.   OT comments  Pt remains limited by cognition and unable to make meaningful progress towards OT goals today. Pt kept eyes closed for majority of session, followed less than 15% one step commands and noted with active resistance to movement today. Pt required Total A x 2 for all bed mobility and with attempts to sit EOB, pt with heavy posterior lean and unable to safely attempt OOB activities. Pt requires Total A for all ADLs at this time and will require extensive physical assist at DC.    Recommendations for follow up therapy are one component of a multi-disciplinary discharge planning process, led by the attending physician.  Recommendations may be updated based on patient status, additional functional criteria and insurance authorization.    Follow Up Recommendations  Skilled nursing-short term rehab (<3 hours/day)    Assistance Recommended at Discharge Frequent or [facility] bed;Other (comment) (hoyer lift)    Recommendations for Other Services      Precautions / Restrictions Precautions Precautions: Fall Precaution Comments: pt with recent SDH, frequent falls, delirium; sternal wound vac, cortrak Restrictions Weight Bearing Restrictions: No       Mobility Bed Mobility Overal bed mobility: Needs Assistance Bed Mobility: Supine to  Sit;Sit to Supine     Supine to sit: Total assist;+2 for physical assistance;+2 for safety/equipment;HOB elevated Sit to supine: Total assist;+2 for physical assistance;+2 for safety/equipment   General bed mobility comments: totalA of 2 as pt either resisting or not participating in any movements    Transfers                   General transfer comment: deferred due to safety, pt resistant of any efforts of trunk flexion, going into trunk extension and needing assist to maintain hips on bed     Balance Overall balance assessment: Needs assistance;History of Falls Sitting-balance support: Bilateral upper extremity supported;Feet supported Sitting balance-Leahy Scale: Poor Sitting balance - Comments: tendency to push back, overall Max A for balance EOB                                   ADL either performed or assessed with clinical judgement   ADL Overall ADL's : Needs assistance/impaired     Grooming: Total assistance;Wash/dry face;Bed level Grooming Details (indicate cue type and reason): attempting to get pt to open eyes, participate - would not demo initiation or involvement with task. Hoped to guide in oral care seated EOB with assist though pt pushing back too much to achieve EOB safely today             Lower Body Dressing: Total assistance;Bed level Lower Body Dressing Details (indicate cue type and reason): manage socks               General ADL Comments: Pt kept eyes closed and with heavy posterior  push back when attempting to sit EOB, demo unsafe scooting EOB and unable to progress activity today    Extremity/Trunk Assessment Upper Extremity Assessment Upper Extremity Assessment: Generalized weakness;Difficult to assess due to impaired cognition LUE Deficits / Details: noted edema surrounding elbow and redness. small open wound above elbow   Lower Extremity Assessment Lower Extremity Assessment: Defer to PT evaluation         Vision   Vision Assessment?: No apparent visual deficits   Perception     Praxis      Cognition Arousal/Alertness: Awake/alert;Lethargic Behavior During Therapy: Flat affect Overall Cognitive Status: No family/caregiver present to determine baseline cognitive functioning Area of Impairment: Orientation;Memory;Attention;Following commands;Safety/judgement;Awareness;Problem solving                 Orientation Level: Disoriented to;Time;Situation;Place Current Attention Level: Focused Memory: Decreased recall of precautions;Decreased short-term memory Following Commands: Follows one step commands inconsistently Safety/Judgement: Decreased awareness of safety;Decreased awareness of deficits Awareness: Intellectual Problem Solving: Slow processing;Decreased initiation;Difficulty sequencing;Requires verbal cues;Requires tactile cues General Comments: Pt humming gospel songs during session, kept eyes closed, followed < 15% one step commands, nonsensical speech          Exercises     Shoulder Instructions       General Comments      Pertinent Vitals/ Pain       Pain Assessment: Faces Faces Pain Scale: Hurts a little bit Pain Location: back Pain Descriptors / Indicators: Grimacing;Guarding Pain Intervention(s): Monitored during session;Limited activity within patient's tolerance;Repositioned  Home Living                                          Prior Functioning/Environment              Frequency  Min 2X/week        Progress Toward Goals  OT Goals(current goals can now be found in the care plan section)  Progress towards OT goals: Not progressing toward goals - comment  Acute Rehab OT Goals Patient Stated Goal: none stated OT Goal Formulation: Patient unable to participate in goal setting Time For Goal Achievement: 05/03/21 Potential to Achieve Goals: Fair ADL Goals Pt Will Perform Eating: with min assist;sitting Pt Will Perform  Grooming: with min assist;sitting Pt Will Perform Upper Body Bathing: with min assist;sitting Additional ADL Goal #1: Pt to follow one step commands > 50% of the time to improve participation in functional tasks Additional ADL Goal #2: Pt to complete bed mobility at MIn A in prep for ADL transfers  Plan Discharge plan remains appropriate    Co-evaluation                 AM-PAC OT "6 Clicks" Daily Activity     Outcome Measure   Help from another person eating meals?: Total Help from another person taking care of personal grooming?: Total Help from another person toileting, which includes using toliet, bedpan, or urinal?: Total Help from another person bathing (including washing, rinsing, drying)?: Total Help from another person to put on and taking off regular upper body clothing?: Total Help from another person to put on and taking off regular lower body clothing?: Total 6 Click Score: 6    End of Session    OT Visit Diagnosis: Unsteadiness on feet (R26.81);Other abnormalities of gait and mobility (R26.89);Muscle weakness (generalized) (M62.81);Other symptoms and signs involving cognitive function   Activity Tolerance  Other (comment) (limited by cognition)   Patient Left in bed;with call bell/phone within reach;with bed alarm set (fall mats and mitts applied)   Nurse Communication Mobility status        Time: 4656-8127 OT Time Calculation (min): 14 min  Charges: OT General Charges $OT Visit: 1 Visit OT Treatments $Therapeutic Activity: 8-22 mins  Malachy Chamber, OTR/L Acute Rehab Services Office: [phone]   [name] 04/27/2021, 9:55 AM

## 2021-04-28 DIAGNOSIS — Z66 Do not resuscitate: Secondary | ICD-10-CM | POA: Diagnosis not present

## 2021-04-28 DIAGNOSIS — M869 Osteomyelitis, unspecified: Secondary | ICD-10-CM | POA: Diagnosis not present

## 2021-04-28 DIAGNOSIS — M4624 Osteomyelitis of vertebra, thoracic region: Secondary | ICD-10-CM | POA: Diagnosis not present

## 2021-04-28 DIAGNOSIS — A4102 Sepsis due to Methicillin resistant Staphylococcus aureus: Secondary | ICD-10-CM | POA: Diagnosis not present

## 2021-04-28 DIAGNOSIS — R410 Disorientation, unspecified: Secondary | ICD-10-CM | POA: Diagnosis not present

## 2021-04-28 DIAGNOSIS — I4891 Unspecified atrial fibrillation: Secondary | ICD-10-CM | POA: Diagnosis not present

## 2021-04-28 DIAGNOSIS — I2690 Septic pulmonary embolism without acute cor pulmonale: Secondary | ICD-10-CM | POA: Diagnosis not present

## 2021-04-28 DIAGNOSIS — R7881 Bacteremia: Secondary | ICD-10-CM | POA: Diagnosis not present

## 2021-04-28 LAB — COMPREHENSIVE METABOLIC PANEL
ALT: 13 U/L (ref 0–44)
AST: 13 U/L — ABNORMAL LOW (ref 15–41)
Albumin: 1.9 g/dL — ABNORMAL LOW (ref 3.5–5.0)
Alkaline Phosphatase: 77 U/L (ref 38–126)
Anion gap: 7 (ref 5–15)
BUN: 20 mg/dL (ref 8–23)
CO2: 25 mmol/L (ref 22–32)
Calcium: 8.7 mg/dL — ABNORMAL LOW (ref 8.9–10.3)
Chloride: 106 mmol/L (ref 98–111)
Creatinine, Ser: 0.58 mg/dL — ABNORMAL LOW (ref 0.61–1.24)
GFR, Estimated: >60 mL/min (ref >60)
Glucose, Bld: 117 mg/dL — ABNORMAL HIGH (ref 70–99)
Potassium: 3.4 mmol/L — ABNORMAL LOW (ref 3.5–5.1)
Sodium: 138 mmol/L (ref 135–145)
Total Bilirubin: 0.3 mg/dL (ref 0.3–1.2)
Total Protein: 6 g/dL — ABNORMAL LOW (ref 6.5–8.1)

## 2021-04-28 LAB — GLUCOSE, CAPILLARY
Glucose-Capillary: 125 mg/dL — ABNORMAL HIGH (ref 70–99)
Glucose-Capillary: 108 mg/dL — ABNORMAL HIGH (ref 70–99)
Glucose-Capillary: 142 mg/dL — ABNORMAL HIGH (ref 70–99)
Glucose-Capillary: 128 mg/dL — ABNORMAL HIGH (ref 70–99)
Glucose-Capillary: 126 mg/dL — ABNORMAL HIGH (ref 70–99)

## 2021-04-28 LAB — CBC
HCT: 24.4 % — ABNORMAL LOW (ref 39.0–52.0)
Hemoglobin: 7.8 g/dL — ABNORMAL LOW (ref 13.0–17.0)
MCH: 33.9 pg (ref 26.0–34.0)
MCHC: 32 g/dL (ref 30.0–36.0)
MCV: 106.1 fL — ABNORMAL HIGH (ref 80.0–100.0)
Platelets: 510 10*3/uL — ABNORMAL HIGH (ref 150–400)
RBC: 2.3 MIL/uL — ABNORMAL LOW (ref 4.22–5.81)
RDW: 15.4 % (ref 11.5–15.5)
WBC: 11.2 10*3/uL — ABNORMAL HIGH (ref 4.0–10.5)
nRBC: 0 % (ref 0.0–0.2)

## 2021-04-28 LAB — AMMONIA: Ammonia: 25 umol/L (ref 9–35)

## 2021-04-28 MED ORDER — POTASSIUM CHLORIDE 20 MEQ PO PACK
40.0000 meq | PACK | Freq: Once | ORAL | Status: AC
  Administered 2021-04-28: 40 meq
  Filled 2021-04-28: qty 2

## 2021-04-28 NOTE — Progress Notes (Signed)
Ok to give one dose of KCL per tube for K+ 3.4 per Dr. Eliseo Squires.  Onnie Boer, PharmD, BCIDP, AAHIVP, CPP Infectious Disease Pharmacist 04/28/2021 8:40 AM

## 2021-04-28 NOTE — Progress Notes (Signed)
[facility] Infectious Diseases Follow Up Note  Patient Identification: Patient Name: [name] MRN: [id number] [place] Date: 05/07/2021  4:57 AM Age: 85 y.o.Today's Date: 04/28/2021   Reason for Visit: MRSA bacteremia  Principal Problem:   Sepsis due to methicillin resistant Staphylococcus aureus (MRSA) ([place]) Active Problems:   History of prostate cancer   Essential hypertension   CAD (coronary artery disease)   Hyperlipidemia, mixed   Lumbar compression fracture (HCC)   Atrial fibrillation with RVR (HCC)   Sternal wound infection   SDH (subdural hematoma)   Pneumonia involving right lung   Delirium   DNR (do not resuscitate)   Goals of care, counseling/discussion   Osteomyelitis of thoracic region ([place])   Protein-calorie malnutrition, severe   Sternal osteomyelitis ([place])   Acute septic pulmonary embolism ([place])   Antibiotics: Vancomycin 11/5-current                     Zosyn 11/5- 11/6  Lines/Hardwares: PICC in rt arm  Interval Events: afebrile,  no significant changes, discharge planning in progress  Assessment MRSA bacteremia: TTE with no vegetations. Repeat blood cx 11/7 no growth ( final). Refused TEE ( poor TEE and surgical candidate)  Sternal fracture with phlegmon/Osteomyelitis  -     s/p sternal I and D on 11/8. OR cultures growing MRSA -     s/p repeat excisional debridement and wound vac change on 11/10 -     Wound Vac change at bedside by CT Surgery yesterday "the wound is growing granulation tissue.  The wound extends below the sternum with pericardium/heart exposure". Per CT surgery, not a candidate for home wound vac change   Recent Falls with SDH with concerns of acute infarct and hydrocephalus per NeuroSx- no surgical intervention per Neuro SX Encephalopathy/Delirium: Possible Pulmonary septic emboli  Thoraco-Lumbar compression  fractures vs Osteomyelitis - No surgical intervention per NeuroSX   Recommendations Continue Vancomycin, pharmacy to dose Plan for 6 weeks course from last OR visit 11/10.  Monitor CBC, BMP and Vancomycin trough ID pharmacy to place OPAT orders  Already has a PICC line  Wound Vac change per CT surgery  Fu palliative care recs, wife wants aggressive medical care and wants to take home as soon as possible  Will arrange for ID fu  ID available as needed   OPAT orders  Diagnosis: Complicated MRSA bacteremia/Possible endocarditis   Culture Result: MRSA  Allergies  Allergen Reactions   Codeine Nausea And Vomiting   Indocin [Indomethacin] Other (See Comments)   Latex Other (See Comments)    Blisters   Mupirocin     Blisters   Plavix [Clopidogrel Bisulfate]     GI intolerance, n/v   Polysporin [Bacitracin-Polymyxin B]     Blisters   Shellfish Allergy Nausea And Vomiting    Mussels   Tape    Neosporin [Neomycin-Bacitracin Zn-Polymyx] Rash   Other Nausea And Vomiting    mussels    OPAT Orders Discharge antibiotics to be given via PICC line Discharge antibiotics: Per pharmacy protocol  Aim for Vancomycin trough 15-20 or AUC 400-550 (unless otherwise indicated) Duration: 6 weeks  End Date:  06/03/21  PIC Care Per Protocol:  Home health RN for IV administration and teaching; PICC line care and labs.    Labs weekly while on IV antibiotics: X__ CBC with differential X__ BMP __ CMP X__ CRP X__ ESR X__ Vancomycin trough __ CK  X__ Please pull PIC at completion of IV antibiotics __ Please leave PIC in place until doctor has seen patient or been notified  Fax weekly labs to [phone]  Clinic Follow Up Appt: 05/13/21  _0 :45 pm   Rest of the management as per the primary team. Thank you for the consult. Please page with pertinent questions or concerns.  ______________________________________________________________________ Subjective patient seen and examined  at the bedside. He tells me " he is ok" otherwise does not answers much and mumbles    BP (!) 149/88 (BP Location: Left Arm)   Pulse 100   Temp 98 F (36.7 C) (Oral)   Resp 14   Ht 6' 0.01" (1.829 m)   Wt 89.2 kg   SpO2 100%   BMI 26.66 kg/m     Physical Exam Constitutional:  sitting up in bed with closed eyes    Comments: Dub hoff in place  Cardiovascular:     Rate and Rhythm: Normal rate and regular rhythm.     Heart sounds: wound vac in place in the anterior chest  Pulmonary:     Effort: Pulmonary effort is normal.     Comments:   Abdominal:     Palpations: Abdomen is soft.     Tenderness: Non tender and non distended   Musculoskeletal:        General: No swelling and tenderness in peripheral joints   Skin:    Comments:   Neurological:     General: unable to assess  Psychiatric:        Mood and Affect:unable to assess  Pertinent Microbiology Results for orders placed or performed during the hospital encounter of 05/12/2021  Culture, blood (routine x 2)     Status: None   Collection Time: 04/19/21  7:55 AM   Specimen: BLOOD LEFT WRIST  Result Value Ref Range Status   Specimen Description BLOOD LEFT WRIST  Final   Special Requests   Final    BOTTLES DRAWN AEROBIC AND ANAEROBIC Blood Culture adequate volume   Culture   Final    NO GROWTH 5 DAYS Performed at [facility], [address] [place], [place] [address]    Report Status 04/24/2021 FINAL  Final  Culture, blood (routine x 2)     Status: None   Collection Time: 04/19/21  8:02 AM   Specimen: BLOOD LEFT HAND  Result Value Ref Range Status   Specimen Description BLOOD LEFT HAND  Final   Special Requests   Final    BOTTLES DRAWN AEROBIC AND ANAEROBIC Blood Culture adequate volume   Culture   Final    NO GROWTH 5 DAYS Performed at [facility], [place] [address], [place], [place] [address]    Report Status 04/24/2021 FINAL  Final  MRSA Next Gen by PCR, Nasal     Status: Abnormal    Collection Time: 04/19/21 11:13 AM   Specimen: Nasal Mucosa; Nasal Swab  Result Value Ref Range Status   MRSA by PCR Next Gen DETECTED (A) NOT DETECTED Final    Comment: RESULT CALLED TO, READ BACK BY AND VERIFIED WITH: D WHITE RN [id number] 04/19/21 A BROWNING (NOTE) The GeneXpert MRSA Assay (FDA approved for NASAL specimens  only), is one component of a comprehensive MRSA colonization surveillance program. It is not intended to diagnose MRSA infection nor to guide or monitor treatment for MRSA infections. Test performance is not FDA approved in patients less than 70 years old. Performed at [facility], [place] [address], [place], [place] [address]   Aerobic/Anaerobic Culture w Gram Stain (surgical/deep wound)     Status: None   Collection Time: 04/25/2021  8:23 AM   Specimen: Wound; Body Fluid  Result Value Ref Range Status   Specimen Description WOUND  Final   Special Requests  STERNAL WOUND  Final   Gram Stain   Final    FEW WBC PRESENT,BOTH PMN AND MONONUCLEAR FEW GRAM POSITIVE COCCI IN PAIRS    Culture   Final    MODERATE METHICILLIN RESISTANT STAPHYLOCOCCUS AUREUS NO ANAEROBES ISOLATED Performed at [facility], [place] [address], [place], [place] [address]    Report Status 04/25/2021 FINAL  Final   Organism ID, Bacteria METHICILLIN RESISTANT STAPHYLOCOCCUS AUREUS  Final      Susceptibility   Methicillin resistant staphylococcus aureus - MIC*    CIPROFLOXACIN >=8 RESISTANT Resistant     ERYTHROMYCIN <=0.25 SENSITIVE Sensitive     GENTAMICIN <=0.5 SENSITIVE Sensitive     OXACILLIN >=4 RESISTANT Resistant     TETRACYCLINE <=1 SENSITIVE Sensitive     VANCOMYCIN 1 SENSITIVE Sensitive     TRIMETH/SULFA <=10 SENSITIVE Sensitive     CLINDAMYCIN <=0.25 SENSITIVE Sensitive     RIFAMPIN <=0.5 SENSITIVE Sensitive     Inducible Clindamycin NEGATIVE Sensitive     * MODERATE METHICILLIN RESISTANT STAPHYLOCOCCUS AUREUS  Aerobic/Anaerobic Culture w Gram Stain (surgical/deep wound)      Status: None   Collection Time: 04/24/2021  8:27 AM   Specimen: PATH Bone biopsy; Tissue  Result Value Ref Range Status   Specimen Description TISSUE  Final   Special Requests  STERNUM  Final   Gram Stain   Final    FEW WBC PRESENT,BOTH PMN AND MONONUCLEAR FEW GRAM POSITIVE COCCI IN PAIRS    Culture   Final    MODERATE METHICILLIN RESISTANT STAPHYLOCOCCUS AUREUS NO ANAEROBES ISOLATED Performed at [facility], [place] [address] [place], [place] [address]    Report Status 04/25/2021 FINAL  Final   Organism ID, Bacteria METHICILLIN RESISTANT STAPHYLOCOCCUS AUREUS  Final      Susceptibility   Methicillin resistant staphylococcus aureus - MIC*    CIPROFLOXACIN >=8 RESISTANT Resistant     ERYTHROMYCIN <=0.25 SENSITIVE Sensitive     GENTAMICIN <=0.5 SENSITIVE Sensitive     OXACILLIN >=4 RESISTANT Resistant     TETRACYCLINE <=1 SENSITIVE Sensitive     VANCOMYCIN 1 SENSITIVE Sensitive     TRIMETH/SULFA <=10 SENSITIVE Sensitive     CLINDAMYCIN <=0.25 SENSITIVE Sensitive     RIFAMPIN <=0.5 SENSITIVE Sensitive     Inducible Clindamycin NEGATIVE Sensitive     * MODERATE METHICILLIN RESISTANT STAPHYLOCOCCUS AUREUS    Pertinent Lab. CBC Latest Ref Rng & Units 04/28/2021 05/04/2021 04/25/2021  WBC 4.0 - 10.5 K/uL 11.2(H) 15.8(H) 14.2(H)  Hemoglobin 13.0 - 17.0 g/dL 7.8(L) 8.5(L) 8.6(L)  Hematocrit 39.0 - 52.0 % 24.4(L) 26.2(L) 26.5(L)  Platelets 150 - 400 K/uL 510(H) 523(H) 537(H)   CMP Latest Ref Rng & Units 04/28/2021 05/01/2021 04/25/2021  Glucose 70 - 99 mg/dL 117(H) 134(H) 120(H)  BUN 8 - 23 mg/dL _0 Creatinine 0.61 - 1.24 mg/dL 0.58(L) 0.58(L) 0.64  Sodium 135 -  145 mmol/L 138 138 138  Potassium 3.5 - 5.1 mmol/L 3.4(L) 3.7 3.4(L)  Chloride 98 - 111 mmol/L 106 107 105  CO2 22 - 32 mmol/L _0 Calcium 8.9 - 10.3 mg/dL 8.7(L) 8.8(L) 8.5(L)  Total Protein 6.5 - 8.1 g/dL 6.0(L) - -  Total Bilirubin 0.3 - 1.2 mg/dL 0.3 - -  Alkaline Phos 38 - 126 U/L 77 - -  AST 15  - 41 U/L 13(L) - -  ALT 0 - 44 U/L 13 - -     Pertinent Imaging today Plain films and CT images have been personally visualized and interpreted; radiology reports have been reviewed. Decision making incorporated into the Impression / Recommendations.  I spent more than 40 minutes for this patient encounter including review of prior medical records, coordination of care  with greater than 50% of time being face to face/counseling and discussing diagnostics/treatment plan with the patient/family.  Electronically signed by:   Rosiland Oz, MD Infectious Disease Physician [facility] Pager: [phone]

## 2021-04-28 NOTE — Progress Notes (Signed)
      [address]       [place] [address]             [phone]      6 Days Post-Op Procedure(s) (LRB): STERNAL WOUND WASHOUT (N/A) WOUND VAC CHANGE (N/A) Subjective: Mr. Matusek is awake but incoherent. Has hand mittens in place. He is receiving TF via CorTrak feeding tube.   Objective: Vital signs in last 24 hours: Temp:  [97.8 F (36.6 C)-98.2 F (36.8 C)] 98 F (36.7 C) (11/16 0308) Pulse Rate:  [91-100] 100 (11/16 0308) Cardiac Rhythm: Normal sinus rhythm (11/15 1900) Resp:  [14-21] 14 (11/16 0308) BP: (105-149)/(59-88) 149/88 (11/16 0308) SpO2:  [97 %-100 %] 100 % (11/16 0308)    Intake/Output from previous day: 11/15 0701 - 11/16 0700 In: -  Out: 1750 [Urine:1750] Intake/Output this shift: No intake/output data recorded.  Heart: regular rate and rhythm Wound: negative pressure chest dressing is intact and functioning appropriately. Minimal serous drainage.   Lab Results: Recent Labs    04/14/2021 0125 04/28/21 0119  WBC 15.8* 11.2*  HGB 8.5* 7.8*  HCT 26.2* 24.4*  PLT 523* 510*   BMET:  Recent Labs    04/27/2021 0125 04/28/21 0119  NA 138 138  K 3.7 3.4*  CL 107 106  CO2 25 25  GLUCOSE 134* 117*  BUN 18 20  CREATININE 0.58* 0.58*  CALCIUM 8.8* 8.7*    PT/INR: No results for input(s): LABPROT, INR in the last 72 hours. ABG    Component Value Date/Time   PHART 7.443 04/19/2021 1854   HCO3 19.9 (L) 04/19/2021 1854   ACIDBASEDEF 3.4 (H) 04/19/2021 1854   O2SAT 95.6 04/19/2021 1854   CBG (last 3)  Recent Labs    04/27/21 2045 04/27/21 2318 04/28/21 0318  GLUCAP 121* 120* 125*    Assessment/Plan: S/P Procedure(s) (LRB): STERNAL WOUND WASHOUT (N/A) WOUND VAC CHANGE (N/A) Open mid-sternal wound s/p excisional debridement on 05/05/2021 after presenting with substernal abscess and MRSA bacteremia. The wound vac dressing was changed at the bedside yesterday and the wound was found to be clean and slowly granulating but still  has a 4x4cm area of exposed pericardium / heart. We do not feel this wound could be safely managed in the outpatient setting at this stage. Needs nutrition optimized as is already being addressed.  Plan for next vac change on Friday, 11/18.     LOS: 10 days    Antony Odea, [place] [phone] 04/28/2021

## 2021-04-28 NOTE — Progress Notes (Signed)
PROGRESS NOTE    [name]   YTK:[id number]  DOB: 1935-12-12  PCP: [name], DO    DOA: 04/16/2021 LOS: 30    Brief Narrative / Hospital Course to Date:   85 y.o. male with medical history significant of HTN; HLD; remote prostate CA; CAD s/p remote CABG; and abdominal wall melanoma presenting to [facility] on 04/17/21 with sepsis, presenting with fever, encephalopathy, respiratory distress and A-fib RVR.  Transferred to [facility] on 04/13/2021 for CT surgery coverage due to a mediastinal fluid collection associated with transverse sternal fracture.  Patient found to have Staph bacteremia with findings of pulmonary septic emboli on CT scan.  Infectious disease, CT surgery, neurosurgery, palliative care consulted.  Patient's wife has not been willing to de-escalate care.  Cardiothoracic continues to follow for chest wound.  He remains on NG tube feedings through cortrak.  Poor overall prognosis.  Disposition will be challenging.   Assessment & Plan    MRSA bacteremia with severe sepsis, present on admission Sepsis POA with leukocytosis, tachycardia, tachypnea, hypoxia.  Evidence of acute organ failure with elevated lactate >2 that is not easily explained by another condition. Blood cultures were positive for MRSA. Septic emboli appreciated on imaging. Suspected source is unknown - PNA, spinal osteo, recent skin surgery are considerations (see below) Infectious disease was consulted.  Patient remains on vancomycin. Transthoracic echocardiogram showed normal systolic function.  No evidence for vegetation was noted. TEE was considered but wife does not want him to have this procedure plus per cardiothoracic surgery this may not be necessary as it would not change management as patient needs long-term IV antibiotics anyway.  PICC line was placed  Remains afebrile  Anterior mediastinal abscess/phlegmon  Mediastinal fluid collection with gas/fluid with transverse sternal  fracture.  Cardiothoracic surgery was consulted. Patient taken for I&D on 11/8 and wound VAC placed. Underwent a wound VAC change in the OR on 11/10. Wound VAC changed at bedside.  There is exposure of pericardium/heart.  They do not think the patient is a candidate for wound VAC changes and dressing changes at home by home health.   Subdural Hematoma (SDH) Patient has had recent SDH x 2, acute on chronic, seen at [facility].  This was secondary to multiple falls. Also with compression fractures, ?osteomyelitis.  Anticoagulation and ASA are contraindicated at this time.  New onset A-fib - POA, Likely triggered by infection. Anticoagulation contraindicated in setting of SDH. - Cardizem  Delirium/acute metabolic encephalopathy Most likely due to infection, unclear if recent traumatic SDH contributing.  Hold sedating meds: Primidone, benadryl (consider d/c), Neurontin -labs unremarkable  Right midlung PNA vs Septic Emboli  Appears to have septic emboli on imaging. Treated with Vanc/Zosyn on admission. Zosyn d/c'd per ID Continue Vancomycin  Hypokalemia  Potassium repleted.   Oropharyngeal dysphagia Seen by speech therapy.  Now on dysphagia 1 diet.  Oral intake is poor.  Plan is to continue with tube feedings for now. -again, poor overall prognosis  Nutrition Due to extremely poor oral intake cortrak feeding tube was placed.  Wife does not want him to come home with a feeding tube.  Continue tube feedings for now.  Thoracolumbar compression fractures with ? Osteomyelitis  No surgical intervention planned.  Seen by neurosurgery.  On antibiotics per ID.   Abdominal wall melanoma -Path reports from excision reflect negative margins.   Left AC superficial thrombophlebitis  Conservative management.  No DVT is noted on imaging studies.  Macrocytic anemia Drop in hemoglobin likely  dilutional.  Hemoglobin is stable for the most part.  No overt bleeding noted.   Anemia panel reviewed.  No  clear-cut deficiencies identified.   TSH is noted to be 1.2 with a normal free T4.   Essential hypertension  Currently on Cardizem.  Blood pressure is stable.   Hyperlipidemia Resume statin at discharge.  Remote Prostate CA  Bladder wall thickening noted on imaging.  Monitor for now.  Outpatient follow-up with urology.  Has a Foley catheter which will remain in place at discharge.   Coronary artery disease s/p remote CABG   Goals of care Palliative care has been consulted since patient's prognosis appears to be poor.  They have had conversations with patient's wife.  Patient has a son who lives out of town.    Multiple discussions held with patient's wife.  patient's wife mentioned to palliative care that she will be unable to take care of him at home but also is reluctant for him to go to skilled nursing facility.  It does not appear that she fully understands the seriousness of the situation.  Patient's prognosis remains poor.  He seems to be gradually declining. She wants him to continue getting aggressive medical therapy.  At this time patient is unsafe for discharge to either home or even to skilled nursing facility.   DVT prophylaxis: SCDs CODE STATUS: DNR   Status is: Inpatient  Remains inpatient appropriate because: Need for IV antibiotics and safe d/c plan    Subjective   Will awaken but is confused   Consults, Procedures, Significant Events   Consultants:  Infectious disease Palliative care Cardiothoracic surgery Neurosurgery  Procedures:  Irrigation and drainage of the mediastinal abscess.  Antimicrobials:  Anti-infectives (From admission, onward)    Start     Dose/Rate Route Frequency Ordered Stop   04/28/21 0830  vancomycin (VANCOREADY) IVPB 1000 mg/200 mL        1,000 mg 200 mL/hr over 60 Minutes Intravenous Every 24 hours 04/27/21 1240     04/23/21 0830  vancomycin (VANCOREADY) IVPB 1250 mg/250 mL  Status:  Discontinued        1,250 mg 166.7  mL/hr over 90 Minutes Intravenous Every 24 hours 04/27/2021 1433 04/27/21 1240   04/19/21 0830  vancomycin (VANCOREADY) IVPB 1750 mg/350 mL  Status:  Discontinued        1,750 mg 175 mL/hr over 120 Minutes Intravenous Every 24 hours 04/19/21 0742 05/05/2021 1433   04/14/2021 2000  vancomycin (VANCOREADY) IVPB 1750 mg/350 mL  Status:  Discontinued        1,750 mg 175 mL/hr over 120 Minutes Intravenous Every 24 hours 04/19/2021 1007 04/21/2021 1706   04/28/2021 1400  piperacillin-tazobactam (ZOSYN) IVPB 3.375 g  Status:  Discontinued        3.375 g 12.5 mL/hr over 240 Minutes Intravenous Every 8 hours 05/07/2021 1007 04/19/21 0742           Objective   Vitals:   04/27/21 1725 04/27/21 1932 04/27/21 2320 04/28/21 0308  BP: 119/64 (!) 105/59 (!) 148/83 (!) 149/88  Pulse: 92 97 98 100  Resp: (!) 21 17 16 14   Temp: 98 F (36.7 C)  98.2 F (36.8 C) 98 F (36.7 C)  TempSrc: Oral  Oral Oral  SpO2: 97% 100% 100% 100%  Weight:      Height:        Intake/Output Summary (Last 24 hours) at 04/28/2021 0843 Last data filed at 04/28/2021 0309 Gross per 24 hour  Intake --  Output 1750 ml  Net -1750 ml   Filed Weights   04/23/21 0331 04/24/21 0409 04/27/21 0331  Weight: 88.7 kg 93.1 kg 89.2 kg    Physical Exam:   General: Appearance:     Overweight chronically ill appearing male in no acute distress     Lungs:      respirations unlabored  Heart:    Tachycardic.     Neurologic:   Will awaken to voice       Labs   Data Reviewed: I have personally reviewed following labs and imaging studies  CBC: Recent Labs  Lab 04/23/21 0614 04/24/21 0236 04/25/21 0329 04/27/2021 0125 04/28/21 0119  WBC 14.3* 14.4* 14.2* 15.8* 11.2*  HGB 8.5* 9.5* 8.6* 8.5* 7.8*  HCT 25.4* 28.7* 26.5* 26.2* 24.4*  MCV 103.3* 104.7* 104.7* 105.6* 106.1*  PLT 601* 540* 537* 523* 009*   Basic Metabolic Panel: Recent Labs  Lab 04/21/21 1602 04/19/2021 0540 05/01/2021 0540 04/21/2021 1613 04/23/21 0614  04/24/21 0236 04/25/21 0329 04/19/2021 0125 04/28/21 0119  NA  --  141   < >  --  138 139 138 138 138  K  --  2.8*   < >  --  3.3* 3.8 3.4* 3.7 3.4*  CL  --  108   < >  --  108 108 105 107 106  CO2  --  24   < >  --  24 24 26 25 25   GLUCOSE  --  124*   < >  --  146* 142* 120* 134* 117*  BUN  --  23   < >  --  17 18 16 18 20   CREATININE  --  0.70   < >  --  0.70 0.74 0.64 0.58* 0.58*  CALCIUM  --  9.4   < >  --  8.9 8.9 8.5* 8.8* 8.7*  MG 1.8 1.8  --  1.6* 2.4  --   --   --   --   PHOS 3.2 3.4  --  3.5 3.7  --   --   --   --    < > = values in this interval not displayed.   GFR: Estimated Creatinine Clearance: 76.8 mL/min (A) (by C-G formula based on SCr of 0.58 mg/dL (L)).   CBG: Recent Labs  Lab 04/27/21 1251 04/27/21 1657 04/27/21 2045 04/27/21 2318 04/28/21 0318  GLUCAP 120* 109* 121* 120* 125*   Sepsis Labs: No results for input(s): PROCALCITON, LATICACIDVEN in the last 168 hours.  Recent Results (from the past 240 hour(s))  Culture, blood (routine x 2)     Status: None   Collection Time: 04/19/21  7:55 AM   Specimen: BLOOD LEFT WRIST  Result Value Ref Range Status   Specimen Description BLOOD LEFT WRIST  Final   Special Requests   Final    BOTTLES DRAWN AEROBIC AND ANAEROBIC Blood Culture adequate volume   Culture   Final    NO GROWTH 5 DAYS Performed at [facility], [address] [place], [place] [address]    Report Status 04/24/2021 FINAL  Final  Culture, blood (routine x 2)     Status: None   Collection Time: 04/19/21  8:02 AM   Specimen: BLOOD LEFT HAND  Result Value Ref Range Status   Specimen Description BLOOD LEFT HAND  Final   Special Requests   Final    BOTTLES DRAWN AEROBIC AND ANAEROBIC Blood Culture adequate volume   Culture  Final    NO GROWTH 5 DAYS Performed at [facility], [place] [address] [place], [place] [address]    Report Status 04/24/2021 FINAL  Final  MRSA Next Gen by PCR, Nasal     Status: Abnormal   Collection  Time: 04/19/21 11:13 AM   Specimen: Nasal Mucosa; Nasal Swab  Result Value Ref Range Status   MRSA by PCR Next Gen DETECTED (A) NOT DETECTED Final    Comment: RESULT CALLED TO, READ BACK BY AND VERIFIED WITH: D WHITE RN 1503 04/19/21 A BROWNING (NOTE) The GeneXpert MRSA Assay (FDA approved for NASAL specimens only), is one component of a comprehensive MRSA colonization surveillance program. It is not intended to diagnose MRSA infection nor to guide or monitor treatment for MRSA infections. Test performance is not FDA approved in patients less than 56 years old. Performed at [facility], [place] [address], [place], [place] [address]   Aerobic/Anaerobic Culture w Gram Stain (surgical/deep wound)     Status: None   Collection Time: 04/25/2021  8:23 AM   Specimen: Wound; Body Fluid  Result Value Ref Range Status   Specimen Description WOUND  Final   Special Requests  STERNAL WOUND  Final   Gram Stain   Final    FEW WBC PRESENT,BOTH PMN AND MONONUCLEAR FEW GRAM POSITIVE COCCI IN PAIRS    Culture   Final    MODERATE METHICILLIN RESISTANT STAPHYLOCOCCUS AUREUS NO ANAEROBES ISOLATED Performed at [facility], [place] [address], [place], [place] [address]    Report Status 04/25/2021 FINAL  Final   Organism ID, Bacteria METHICILLIN RESISTANT STAPHYLOCOCCUS AUREUS  Final      Susceptibility   Methicillin resistant staphylococcus aureus - MIC*    CIPROFLOXACIN >=8 RESISTANT Resistant     ERYTHROMYCIN <=0.25 SENSITIVE Sensitive     GENTAMICIN <=0.5 SENSITIVE Sensitive     OXACILLIN >=4 RESISTANT Resistant     TETRACYCLINE <=1 SENSITIVE Sensitive     VANCOMYCIN 1 SENSITIVE Sensitive     TRIMETH/SULFA <=10 SENSITIVE Sensitive     CLINDAMYCIN <=0.25 SENSITIVE Sensitive     RIFAMPIN <=0.5 SENSITIVE Sensitive     Inducible Clindamycin NEGATIVE Sensitive     * MODERATE METHICILLIN RESISTANT STAPHYLOCOCCUS AUREUS  Aerobic/Anaerobic Culture w Gram Stain (surgical/deep wound)     Status:  None   Collection Time: 04/19/2021  8:27 AM   Specimen: PATH Bone biopsy; Tissue  Result Value Ref Range Status   Specimen Description TISSUE  Final   Special Requests  STERNUM  Final   Gram Stain   Final    FEW WBC PRESENT,BOTH PMN AND MONONUCLEAR FEW GRAM POSITIVE COCCI IN PAIRS    Culture   Final    MODERATE METHICILLIN RESISTANT STAPHYLOCOCCUS AUREUS NO ANAEROBES ISOLATED Performed at [facility], [place] [address], [place],  [address]    Report Status 04/25/2021 FINAL  Final   Organism ID, Bacteria METHICILLIN RESISTANT STAPHYLOCOCCUS AUREUS  Final      Susceptibility   Methicillin resistant staphylococcus aureus - MIC*    CIPROFLOXACIN >=8 RESISTANT Resistant     ERYTHROMYCIN <=0.25 SENSITIVE Sensitive     GENTAMICIN <=0.5 SENSITIVE Sensitive     OXACILLIN >=4 RESISTANT Resistant     TETRACYCLINE <=1 SENSITIVE Sensitive     VANCOMYCIN 1 SENSITIVE Sensitive     TRIMETH/SULFA <=10 SENSITIVE Sensitive     CLINDAMYCIN <=0.25 SENSITIVE Sensitive     RIFAMPIN <=0.5 SENSITIVE Sensitive  Inducible Clindamycin NEGATIVE Sensitive     * MODERATE METHICILLIN RESISTANT STAPHYLOCOCCUS AUREUS       Imaging Studies   No results found.   Medications   Scheduled Meds:  acetaminophen  1,000 mg Per Tube Q6H   azelastine  1 spray Each Nare BID   chlorhexidine  15 mL Mouth Rinse BID   Chlorhexidine Gluconate Cloth  6 each Topical Daily   diltiazem  60 mg Per Tube Q6H   docusate  100 mg Per Tube BID   feeding supplement (PROSource TF)  45 mL Per Tube BID   mouth rinse  15 mL Mouth Rinse q12n4p   potassium chloride  40 mEq Per Tube Once   sodium chloride flush  10-40 mL Intracatheter Q12H   sodium chloride flush  3 mL Intravenous Q12H   Continuous Infusions:  0.45 % NaCl with KCl 20 mEq / L 10 mL/hr at 04/27/21 0230   feeding supplement (JEVITY 1.5 CAL/FIBER) 1,000 mL (04/27/21 0802)   vancomycin         LOS: 10 days    Geradine Girt,  [facility]  04/28/2021, 8:43 AM

## 2021-04-28 NOTE — Progress Notes (Signed)
Speech Language Pathology Treatment: Dysphagia  Patient Details Name: [name] MRN: [id number] DOB: 1935-11-01 Today's Date: 04/28/2021 Time: 1130-1150 SLP Time Calculation (min) (ACUTE ONLY): 20 min  Assessment / Plan / Recommendation Clinical Impression  Pt was seen at bedside to assess diet tolerance and determine appropriateness for advanced textures. Pt was moaning in bed upon arrival of SLP, but when asked if he was in pain, pt stated "no". Oral care was completed with suction. Pt continues to have dried secretions/skin on his lips. These were not removed for fear of causing him to bleed. Following oral care, pt accepted trials of ice chips, which he tolerated well. Extended oral prep noted, but adequate laryngeal elevation observed. Pt spit out initial bolus of puree, but then accepted a bolus and exhibited extended oral prep once again. Pt was unable to demonstrate ability to use the straw to drink water, and attempted cup sips were unsuccessful. No overt s/s aspiration observed. It is anticipated that pt is not meeting his nutritional needs PO, but appears to be safe with what he will accept of current (Dys1/thin) diet.   HPI HPI: Pt is an 85 yo male who presented 11/5 with AMS after being d/c home on 11/4 from [facility] where he was admitted for fall with LOC and SDH. Chest CT revealed large mediastinum fluid collection with gas, with a transverse fx of sternum. Pt s/p sternal I&D and wound vac placement on 11/8 and repeat debridement on 11/10. Cortrak placed 11/9. PMT meeting 11/12: pt's wife would like to continue aggressive medical care and wants him d/c on 11/14. PMH: frequent falls, polypharmacy, neuropathy, dementia, chronic hyponatremia, HTN.      SLP Plan  Continue with current plan of care      Recommendations for follow up therapy are one component of a multi-disciplinary discharge planning process, led by the attending physician.  Recommendations may be updated  based on patient status, additional functional criteria and insurance authorization.    Recommendations  Diet recommendations: Dysphagia 1 (puree);Thin liquid Liquids provided via: Cup;Straw Medication Administration: Other (Comment) (or via cortrak) Supervision: Trained caregiver to feed patient;Full supervision/cueing for compensatory strategies Compensations: Slow rate;Small sips/bites;Minimize environmental distractions Postural Changes and/or Swallow Maneuvers: Seated upright 90 degrees;Upright 30-60 min after meal                Oral Care Recommendations: Oral care QID Follow Up Recommendations: Skilled nursing-short term rehab (<3 hours/day) Assistance recommended at discharge: Frequent or constant Supervision/Assistance SLP Visit Diagnosis: Dysphagia, unspecified (R13.10) Plan: Continue with current plan of care       [place]. Quentin Ore, [facility], [place] Speech Language Pathologist Office: [phone]  Shonna Chock  04/28/2021, 11:54 AM

## 2021-04-29 ENCOUNTER — Inpatient Hospital Stay (HOSPITAL_COMMUNITY): Payer: Medicare Other

## 2021-04-29 DIAGNOSIS — M4624 Osteomyelitis of vertebra, thoracic region: Secondary | ICD-10-CM | POA: Diagnosis not present

## 2021-04-29 DIAGNOSIS — E43 Unspecified severe protein-calorie malnutrition: Secondary | ICD-10-CM | POA: Diagnosis not present

## 2021-04-29 DIAGNOSIS — R140 Abdominal distension (gaseous): Secondary | ICD-10-CM

## 2021-04-29 DIAGNOSIS — J189 Pneumonia, unspecified organism: Secondary | ICD-10-CM

## 2021-04-29 LAB — CBC WITH DIFFERENTIAL/PLATELET
Abs Immature Granulocytes: 0.05 10*3/uL (ref 0.00–0.07)
Basophils Absolute: 0 10*3/uL (ref 0.0–0.1)
Basophils Relative: 0 %
Eosinophils Absolute: 0.1 10*3/uL (ref 0.0–0.5)
Eosinophils Relative: 1 %
HCT: 23.8 % — ABNORMAL LOW (ref 39.0–52.0)
Hemoglobin: 7.3 g/dL — ABNORMAL LOW (ref 13.0–17.0)
Immature Granulocytes: 1 %
Lymphocytes Relative: 8 %
Lymphs Abs: 0.8 10*3/uL (ref 0.7–4.0)
MCH: 32.9 pg (ref 26.0–34.0)
MCHC: 30.7 g/dL (ref 30.0–36.0)
MCV: 107.2 fL — ABNORMAL HIGH (ref 80.0–100.0)
Monocytes Absolute: 0.5 10*3/uL (ref 0.1–1.0)
Monocytes Relative: 5 %
Neutro Abs: 8.3 10*3/uL — ABNORMAL HIGH (ref 1.7–7.7)
Neutrophils Relative %: 85 %
Platelets: 481 10*3/uL — ABNORMAL HIGH (ref 150–400)
RBC: 2.22 MIL/uL — ABNORMAL LOW (ref 4.22–5.81)
RDW: 15.2 % (ref 11.5–15.5)
WBC: 9.8 10*3/uL (ref 4.0–10.5)
nRBC: 0 % (ref 0.0–0.2)

## 2021-04-29 LAB — COMPREHENSIVE METABOLIC PANEL
ALT: 14 U/L (ref 0–44)
AST: 15 U/L (ref 15–41)
Albumin: 1.9 g/dL — ABNORMAL LOW (ref 3.5–5.0)
Alkaline Phosphatase: 81 U/L (ref 38–126)
Anion gap: 8 (ref 5–15)
BUN: 24 mg/dL — ABNORMAL HIGH (ref 8–23)
CO2: 25 mmol/L (ref 22–32)
Calcium: 8.9 mg/dL (ref 8.9–10.3)
Chloride: 109 mmol/L (ref 98–111)
Creatinine, Ser: 0.73 mg/dL (ref 0.61–1.24)
GFR, Estimated: >60 mL/min (ref >60)
Glucose, Bld: 133 mg/dL — ABNORMAL HIGH (ref 70–99)
Potassium: 3.3 mmol/L — ABNORMAL LOW (ref 3.5–5.1)
Sodium: 142 mmol/L (ref 135–145)
Total Bilirubin: 0.7 mg/dL (ref 0.3–1.2)
Total Protein: 6.3 g/dL — ABNORMAL LOW (ref 6.5–8.1)

## 2021-04-29 LAB — GLUCOSE, CAPILLARY
Glucose-Capillary: 110 mg/dL — ABNORMAL HIGH (ref 70–99)
Glucose-Capillary: 117 mg/dL — ABNORMAL HIGH (ref 70–99)
Glucose-Capillary: 125 mg/dL — ABNORMAL HIGH (ref 70–99)
Glucose-Capillary: 125 mg/dL — ABNORMAL HIGH (ref 70–99)
Glucose-Capillary: 133 mg/dL — ABNORMAL HIGH (ref 70–99)
Glucose-Capillary: 134 mg/dL — ABNORMAL HIGH (ref 70–99)
Glucose-Capillary: 136 mg/dL — ABNORMAL HIGH (ref 70–99)

## 2021-04-29 LAB — LACTIC ACID, PLASMA: Lactic Acid, Venous: 1 mmol/L (ref 0.5–1.9)

## 2021-04-29 LAB — C-REACTIVE PROTEIN: CRP: 15.7 mg/dL — ABNORMAL HIGH (ref <1.0)

## 2021-04-29 MED ORDER — BISACODYL 10 MG RE SUPP
10.0000 mg | Freq: Once | RECTAL | Status: AC
  Administered 2021-04-29: 17:00:00 10 mg via RECTAL
  Filled 2021-04-29: qty 1

## 2021-04-29 MED ORDER — LACTULOSE 10 GM/15ML PO SOLN
20.0000 g | Freq: Three times a day (TID) | ORAL | Status: AC
  Administered 2021-04-29 – 2021-04-30 (×3): 20 g
  Filled 2021-04-29 (×3): qty 30

## 2021-04-29 NOTE — Progress Notes (Addendum)
[facility] PROGRESS NOTE  [name] WNI:[id number] DOB: Feb 04, 1936 DOA: 05/12/2021 PCP: [name], DO  Status: Remains inpatient appropriate because:  Not medically stable to discharge due to open chest wound with exposed pericardium requiring inpatient wound VAC management; unsafe discharge plan  Barriers to discharge: Social: Wife does not wish to bring patient home with feeding tube in place that he has not managed appropriate oral intake and is currently requiring enteral feeds to maintain appropriate nutrition for wound healing  Clinical: Continued requirement for inpatient wound VAC management as described above; poor oral intake requiring supplemental enteral feeds via core track tube  Level of care:  Progressive   Code Status: DNR Family Communication: Patient only-only at bedside on 11/17 DVT prophylaxis:  COVID vaccination status:  Foley catheter: Yes POA: Yes -placed at [facility] 04/17/2021  HPI: 85 y.o. male with medical history significant of HTN; HLD; remote prostate CA; CAD s/p remote CABG; and abdominal wall melanoma.24 hours prior to presentation had been discharged from [facility] with recommendation for SNF placement but spouse refused and requested patient be discharged home with 24/7 care.  He presented to [facility] on 04/17/21 with sepsis, presenting with fever, encephalopathy, respiratory distress and A-fib RVR.  Transferred to [facility] on 05/11/2021 for CT surgery coverage due to a mediastinal fluid collection associated with transverse sternal fracture.  Patient found to have Staph bacteremia with findings of pulmonary septic emboli on CT scan.   Infectious disease, CT surgery, neurosurgery, palliative care consulted.  Patient's wife has not been willing to de-escalate care.  Cardiothoracic continues to follow for chest wound.  He remains on NG tube feedings through cortrak.  Poor overall prognosis.   Subjective: Patient lethargic but after  stimulation he did awaken somewhat although did not open his eyes.  Stated "no" when I asked him if his abdomen hurt during palpation  Objective: Vitals:   04/29/21 0007 04/29/21 0300  BP: (!) 146/90 (!) 155/92  Pulse: 97 100  Resp: 20 20  Temp: 99.9 F (37.7 C) 99.7 F (37.6 C)  SpO2: 97% 95%   No intake or output data in the 24 hours ending 04/29/21 0740 Filed Weights   04/23/21 0331 04/24/21 0409 04/27/21 0331  Weight: 88.7 kg 93.1 kg 89.2 kg    Exam:  Constitutional: NAD, lethargic but will awaken, appears to be comfortable during exam Respiratory: clear to auscultation bilaterally but decreased in bases in context of significantly distended abdomen, no wheezing, no crackles. Normal respiratory effort. No accessory muscle use.  Room air, O2 sats between 93 and 98% Cardiovascular: Regular rate and rhythm, no murmurs / rubs / gallops. No extremity edema.  Extremities mottled.  Pedal pulses weak.  Normotensive otherwise Abdomen: Markedly distended and tympanitic with absent to extremely hypoactive bowel sounds no tenderness palpated. Bowel sounds positive.  LBM 11/16 Neurologic: CN 2-12 grossly intact. Sensation intact, DTR normal. Strength 5/5 x all 4 extremities.  Psychiatric: Lethargic, does resist manual opening of eyes.  Briefly did respond to questions asked but no meaningful engagement in conversation otherwise  Assessment/Plan: Acute problems: MRSA bacteremia with severe sepsis POA/septic emboli Sepsis POA with leukocytosis, tachycardia, tachypnea, hypoxia.  Evidence of acute organ failure with elevated lactate >2 that is not easily explained by another condition.  Sepsis physiology has resolved Suspected source is unknown - PNA, spinal osteo, recent skin surgery are considerations (see below) Infectious disease was consulted.  LD Vanco 6 weeks from last OR visit 11/10 TTE negative for endocarditis.  TEE was considered but wife does not want him to have this procedure-  per  CTS would not change management as patient needs long-term IV antibiotics.     Anterior mediastinal abscess/phlegmon w/ transverse sternal fx  Cardiothoracic surgery was consulted. Patient taken to OR for I&D on 11/8 and wound VAC placed. Repeat VAC change 11/10 Wound VAC changed at bedside continued exposure of pericardium/heart which excludes him from home health wound VAC changes  Abdominal distention Hypoactive to absent bowel sounds concerning for possible ileus Obtain two-view portable abdominal films: Imaging consistent with ileus and upon my review there appears to be some retained stool so as precaution will give lactulose 20 g per tube TID x 3 doses CRP elevated but electrolyte panel, CBC and lactic acid within normal ranges for this patient.   Subdural Hematoma (SDH) Patient has had recent SDH x 2, acute on chronic, seen at [facility].  This was secondary to multiple falls. Also with compression fractures Anticoagulation and ASA are contraindicated at this time.   New onset A-fib - POA, Likely triggered by infection.  Anticoagulation contraindicated in setting of SDH. Maintaining sinus rhythm/intermittent sinus tachycardia on Cardizem   Delirium/acute metabolic encephalopathy Most likely due to infection, unclear if recent traumatic SDH contributing.  Continue to hold sedating meds: Primidone, benadryl (consider d/c), Neurontin   Right midlung PNA vs Septic Emboli  Appears to have septic emboli on imaging. Treated with Vanc/Zosyn on admission. Zosyn d/c'd per ID Continue Vancomycin as recommended by ID   Hypokalemia  Potassium repleted.   Oropharyngeal dysphagia/severe protein calorie malnutritionContinue dysphagia 1 diet.  Oral intake is poor.   Plan is to continue with tube feedings for now via cortrack. Wife has stated that she does not want husband to come home with feeding tube in place Nutrition Problem: Severe Malnutrition Etiology: chronic illness  (CAD) Signs/Symptoms: severe fat depletion, severe muscle depletion Interventions: Ensure Enlive (each supplement provides 350kcal and 20 grams of protein), MVI     Thoracolumbar compression fractures with ? Osteomyelitis  No surgical intervention planned.  Seen by neurosurgery.  On antibiotics per ID.   Abdominal wall melanoma -Path reports from excision reflect negative margins.   Left AC superficial thrombophlebitis  Conservative management.  No DVT is noted on imaging studies.   Macrocytic anemia Drop in hemoglobin likely dilutional.  Hemoglobin is stable for the most part.  No overt bleeding noted.   Anemia panel reviewed.  No clear-cut deficiencies identified.   TSH is noted to be 1.2 with a normal free T4.   Essential hypertension  Currently on Cardizem.  Blood pressure is stable.   Hyperlipidemia Resume statin at discharge.   Remote Prostate CA  Bladder wall thickening noted on imaging.  Monitor for now.  Outpatient follow-up with urology.  Has a Foley catheter which will remain in place at discharge.   Coronary artery disease s/p remote CABG   Goals of care Palliative care has been consulted since patient's prognosis appears to be poor.  They have had conversations with patient's wife.  Patient has a son who lives out of town.    Multiple discussions held with patient's wife.  patient's wife mentioned to palliative care that she will be unable to take care of him at home but also is reluctant for him to go to skilled nursing facility.  It does not appear that she fully understands the seriousness of the situation.  Patient's prognosis remains poor.  He seems to be gradually declining. She wants  him to continue getting aggressive medical therapy.  At this time patient is unsafe for discharge to either home or even to skilled nursing facility.      Data Reviewed: Basic Metabolic Panel: Recent Labs  Lab 04/19/2021 1613 04/23/21 3976 04/24/21 0236 04/25/21 0329  05/07/2021 0125 04/28/21 0119  NA  --  138 139 138 138 138  K  --  3.3* 3.8 3.4* 3.7 3.4*  CL  --  108 108 105 107 106  CO2  --  24 24 26 25 25   GLUCOSE  --  146* 142* 120* 134* 117*  BUN  --  17 18 16 18 20   CREATININE  --  0.70 0.74 0.64 0.58* 0.58*  CALCIUM  --  8.9 8.9 8.5* 8.8* 8.7*  MG 1.6* 2.4  --   --   --   --   PHOS 3.5 3.7  --   --   --   --    Liver Function Tests: Recent Labs  Lab 04/28/21 0119  AST 13*  ALT 13  ALKPHOS 77  BILITOT 0.3  PROT 6.0*  ALBUMIN 1.9*   No results for input(s): LIPASE, AMYLASE in the last 168 hours. Recent Labs  Lab 04/28/21 0119  AMMONIA 25   CBC: Recent Labs  Lab 04/23/21 0614 04/24/21 0236 04/25/21 0329 05/05/2021 0125 04/28/21 0119  WBC 14.3* 14.4* 14.2* 15.8* 11.2*  HGB 8.5* 9.5* 8.6* 8.5* 7.8*  HCT 25.4* 28.7* 26.5* 26.2* 24.4*  MCV 103.3* 104.7* 104.7* 105.6* 106.1*  PLT 601* 540* 537* 523* 510*    CBG: Recent Labs  Lab 04/28/21 1226 04/28/21 1640 04/28/21 2040 04/29/21 0002 04/29/21 0413  GLUCAP 142* 128* 126* 133* 125*    Recent Results (from the past 240 hour(s))  Culture, blood (routine x 2)     Status: None   Collection Time: 04/19/21  7:55 AM   Specimen: BLOOD LEFT WRIST  Result Value Ref Range Status   Specimen Description BLOOD LEFT WRIST  Final   Special Requests   Final    BOTTLES DRAWN AEROBIC AND ANAEROBIC Blood Culture adequate volume   Culture   Final    NO GROWTH 5 DAYS Performed at [facility], [address] [place], [place] [address]    Report Status 04/24/2021 FINAL  Final  Culture, blood (routine x 2)     Status: None   Collection Time: 04/19/21  8:02 AM   Specimen: BLOOD LEFT HAND  Result Value Ref Range Status   Specimen Description BLOOD LEFT HAND  Final   Special Requests   Final    BOTTLES DRAWN AEROBIC AND ANAEROBIC Blood Culture adequate volume   Culture   Final    NO GROWTH 5 DAYS Performed at [facility], [place] [address], [place], [place] [address]     Report Status 04/24/2021 FINAL  Final  MRSA Next Gen by PCR, Nasal     Status: Abnormal   Collection Time: 04/19/21 11:13 AM   Specimen: Nasal Mucosa; Nasal Swab  Result Value Ref Range Status   MRSA by PCR Next Gen DETECTED (A) NOT DETECTED Final    Comment: RESULT CALLED TO, READ BACK BY AND VERIFIED WITH: D WHITE RN 1503 04/19/21 A BROWNING (NOTE) The GeneXpert MRSA Assay (FDA approved for NASAL specimens only), is one component of a comprehensive MRSA colonization surveillance program. It is not intended to diagnose MRSA infection nor to guide or monitor treatment for MRSA infections. Test performance is not  FDA approved in patients less than 63 years old. Performed at [facility], [place] [address], [place], [place] [address]   Aerobic/Anaerobic Culture w Gram Stain (surgical/deep wound)     Status: None   Collection Time: 04/21/2021  8:23 AM   Specimen: Wound; Body Fluid  Result Value Ref Range Status   Specimen Description WOUND  Final   Special Requests  STERNAL WOUND  Final   Gram Stain   Final    FEW WBC PRESENT,BOTH PMN AND MONONUCLEAR FEW GRAM POSITIVE COCCI IN PAIRS    Culture   Final    MODERATE METHICILLIN RESISTANT STAPHYLOCOCCUS AUREUS NO ANAEROBES ISOLATED Performed at [facility], [place] [address], [place], [place] [address]    Report Status 04/25/2021 FINAL  Final   Organism ID, Bacteria METHICILLIN RESISTANT STAPHYLOCOCCUS AUREUS  Final      Susceptibility   Methicillin resistant staphylococcus aureus - MIC*    CIPROFLOXACIN >=8 RESISTANT Resistant     ERYTHROMYCIN <=0.25 SENSITIVE Sensitive     GENTAMICIN <=0.5 SENSITIVE Sensitive     OXACILLIN >=4 RESISTANT Resistant     TETRACYCLINE <=1 SENSITIVE Sensitive     VANCOMYCIN 1 SENSITIVE Sensitive     TRIMETH/SULFA <=10 SENSITIVE Sensitive     CLINDAMYCIN <=0.25 SENSITIVE Sensitive     RIFAMPIN <=0.5 SENSITIVE Sensitive     Inducible Clindamycin NEGATIVE Sensitive     * MODERATE METHICILLIN  RESISTANT STAPHYLOCOCCUS AUREUS  Aerobic/Anaerobic Culture w Gram Stain (surgical/deep wound)     Status: None   Collection Time: 05/05/2021  8:27 AM   Specimen: PATH Bone biopsy; Tissue  Result Value Ref Range Status   Specimen Description TISSUE  Final   Special Requests  STERNUM  Final   Gram Stain   Final    FEW WBC PRESENT,BOTH PMN AND MONONUCLEAR FEW GRAM POSITIVE COCCI IN PAIRS    Culture   Final    MODERATE METHICILLIN RESISTANT STAPHYLOCOCCUS AUREUS NO ANAEROBES ISOLATED Performed at [facility], [place] [address] [place], [place] [address]    Report Status 04/25/2021 FINAL  Final   Organism ID, Bacteria METHICILLIN RESISTANT STAPHYLOCOCCUS AUREUS  Final      Susceptibility   Methicillin resistant staphylococcus aureus - MIC*    CIPROFLOXACIN >=8 RESISTANT Resistant     ERYTHROMYCIN <=0.25 SENSITIVE Sensitive     GENTAMICIN <=0.5 SENSITIVE Sensitive     OXACILLIN >=4 RESISTANT Resistant     TETRACYCLINE <=1 SENSITIVE Sensitive     VANCOMYCIN 1 SENSITIVE Sensitive     TRIMETH/SULFA <=10 SENSITIVE Sensitive     CLINDAMYCIN <=0.25 SENSITIVE Sensitive     RIFAMPIN <=0.5 SENSITIVE Sensitive     Inducible Clindamycin NEGATIVE Sensitive     * MODERATE METHICILLIN RESISTANT STAPHYLOCOCCUS AUREUS     Scheduled Meds:  acetaminophen  1,000 mg Per Tube Q6H   azelastine  1 spray Each Nare BID   chlorhexidine  15 mL Mouth Rinse BID   Chlorhexidine Gluconate Cloth  6 each Topical Daily   diltiazem  60 mg Per Tube Q6H   docusate  100 mg Per Tube BID   feeding supplement (PROSource TF)  45 mL Per Tube BID   mouth rinse  15 mL Mouth Rinse q12n4p   sodium chloride flush  10-40 mL Intracatheter Q12H   sodium chloride flush  3 mL Intravenous Q12H   Continuous Infusions:  0.45 % NaCl with KCl 20 mEq / L 10 mL/hr at 04/29/21 0604   feeding  supplement (JEVITY 1.5 CAL/FIBER) 1,000 mL (04/28/21 2121)   vancomycin 1,000 mg (04/28/21 0947)    Principal Problem:   Sepsis due to  methicillin resistant Staphylococcus aureus (MRSA) ([place]) Active Problems:   History of prostate cancer   Essential hypertension   CAD (coronary artery disease)   Hyperlipidemia, mixed   Lumbar compression fracture (HCC)   Atrial fibrillation with RVR (HCC)   Sternal wound infection   SDH (subdural hematoma)   Pneumonia involving right lung   Delirium   DNR (do not resuscitate)   Goals of care, counseling/discussion   Osteomyelitis of thoracic region ([place])   Protein-calorie malnutrition, severe   Sternal osteomyelitis ([place])   Acute septic pulmonary embolism [facility])   Consultants: Infectious disease Palliative medicine team Cardiothoracic surgery Neurosurgery  Procedures: Echocardiogram Core track tube placement Central line placement Sternal wound irrigation and debridement 16/1 with application of wound VAC Sternal washout with wound VAC change 11/10  Antibiotics: Vancomycin 11/5 >>> Zosyn 11/5 through 11/6   Time spent: 35 minutes    Erin Hearing ANP  [facility] 7 am - 330 pm/M-F for direct patient care and secure chat Please refer to Amion for contact info 11  days

## 2021-04-29 NOTE — Progress Notes (Signed)
Occupational Therapy Treatment Patient Details Name: [name] MRN: [id number] DOB: 1935/06/29 Today's Date: 04/29/2021   History of present illness Pt is an 85 y.o. male presenting 04/17/21 with AMS after being d/c home on 11/4 from [facility] where he was admitted for fall with LOC and SDH. Chest CT revealed large mediastinum fluid collection with gas, with a transverse fx of sternum. S/p sternal I&D and wound vac placement on 11/8; s/p wound vac change in OR on 11/10. S/p cortrak placement 11/9. PMH includes frequent falls, polypharmacy, neuropathy, dementia, chronic hyponatremia, HTN.   OT comments  +2 total to max assist for bed mobility. Sat EOB with max to min assist x 20 minutes. Pt primarily with eyes closed, but talking with stronger voice as session continued and after sips of water. Pt declined applesauce. Responded favorable to music. RR in mid 40s when anxious, VS otherwise stable. Continue to recommend SNF.   Recommendations for follow up therapy are one component of a multi-disciplinary discharge planning process, led by the attending physician.  Recommendations may be updated based on patient status, additional functional criteria and insurance authorization.    Follow Up Recommendations  Skilled nursing-short term rehab (<3 hours/day)    Assistance Recommended at Discharge Frequent or [facility] bed;Other (comment) (hoyer lift)    Recommendations for Other Services      Precautions / Restrictions Precautions Precautions: Fall;Other (comment) Precaution Comments: pt with recent SDH, frequent falls, delirium; sternal wound vac, cortrak; strong posterior lean Restrictions Weight Bearing Restrictions: No       Mobility Bed Mobility Overal bed mobility: Needs Assistance Bed Mobility: Supine to Sit;Sit to Supine     Supine to sit: Total assist;+2 for physical assistance;+2 for safety/equipment;HOB  elevated Sit to supine: Max assist;+2 for physical assistance;+2 for safety/equipment   General bed mobility comments: TotalA+2 to come to sit EOB, pt with initial strong posterior lean requiring maxA to keep bilateral knees flexed and keep hips from scooting off EOB; pt initiating return to supine, maxA+2 for trunk and BLE management; totalA for scooting up and repositioning in bed    Transfers                   General transfer comment: deferred due to safety, pt resistant of any efforts of trunk flexion, going into trunk extension and needing assist to maintain hips on bed     Balance Overall balance assessment: Needs assistance;History of Falls Sitting-balance support: Bilateral upper extremity supported;Feet supported Sitting balance-Leahy Scale: Poor Sitting balance - Comments: Initial posterior lean requiring maxA+2 to maintain static sitting EOB, progressing to minA+2 to maintain bilateral knee flexion and maintaining upright without trunk support; noted improving ability to translate weight anteriorly and lateral with time, also attempting to scoot self back on EOB with LUE support 1x Postural control: Posterior lean                                 ADL either performed or assessed with clinical judgement   ADL Overall ADL's : Needs assistance/impaired Eating/Feeding: Total assistance;Bed level (HOB up) Eating/Feeding Details (indicate cue type and reason): used straw to pipette water into mouth, pt closing lips around straw and asking for more     Upper Body Bathing: Total assistance;Sitting Upper Body Bathing Details (indicate cue type and reason): back  Extremity/Trunk Assessment              Vision       Perception     Praxis      Cognition Arousal/Alertness: Awake/alert;Lethargic Behavior During Therapy: Flat affect;Anxious;Restless Overall Cognitive Status: Difficult to assess                                  General Comments: Pt initially with eyes open and waved hello to therapist with R hand; later keeping eyes closed majority of session; restless and reports fearful of falling, becoming more relaxed while sitting EOB with support, encouragement and music. Able to state he wanted water and speech more clear after taking sips at end of session with max assist          Exercises     Shoulder Instructions       General Comments      Pertinent Vitals/ Pain       Pain Assessment: Faces Faces Pain Scale: Hurts a little bit Pain Location: grimacing with cortrak line repositioning Pain Descriptors / Indicators: Grimacing Pain Intervention(s): Monitored during session;Limited activity within patient's tolerance  Home Living                                          Prior Functioning/Environment              Frequency  Min 2X/week        Progress Toward Goals  OT Goals(current goals can now be found in the care plan section)  Progress towards OT goals: Not progressing toward goals - comment  Acute Rehab OT Goals OT Goal Formulation: Patient unable to participate in goal setting Time For Goal Achievement: 05/03/21 Potential to Achieve Goals: [place] Discharge plan remains appropriate    Co-evaluation    PT/OT/SLP Co-Evaluation/Treatment: Yes Reason for Co-Treatment: For patient/therapist safety   OT goals addressed during session: Strengthening/ROM      AM-PAC OT "6 Clicks" Daily Activity     Outcome Measure   Help from another person eating meals?: Total Help from another person taking care of personal grooming?: Total Help from another person toileting, which includes using toliet, bedpan, or urinal?: Total Help from another person bathing (including washing, rinsing, drying)?: Total Help from another person to put on and taking off regular upper body clothing?: Total Help from another person to put on and  taking off regular lower body clothing?: Total 6 Click Score: 6    End of Session    OT Visit Diagnosis: Unsteadiness on feet (R26.81);Other abnormalities of gait and mobility (R26.89);Muscle weakness (generalized) (M62.81);Other symptoms and signs involving cognitive function   Activity Tolerance Patient limited by lethargy   Patient Left in bed;with call bell/phone within reach;with bed alarm set;with restraints reapplied   Nurse Communication          Time: 6045-4098 OT Time Calculation (min): 35 min  Charges: OT General Charges $OT Visit: 1 Visit OT Treatments $Therapeutic Activity: 8-22 mins  Nestor Lewandowsky, OTR/L Acute Rehabilitation Services Pager: [phone] Office: [phone]   Malka So 04/29/2021, 4:41 PM

## 2021-04-29 NOTE — Progress Notes (Addendum)
Physical Therapy Treatment Patient Details Name: [name] MRN: [id number] DOB: 06/12/1936 Today's Date: 04/29/2021   History of Present Illness Pt is an 85 y.o. male presenting 04/17/21 with AMS after being d/c home on 11/4 from [facility] where he was admitted for fall with LOC and SDH. Chest CT revealed large mediastinum fluid collection with gas, with a transverse fx of sternum. S/p sternal I&D and wound vac placement on 11/8; s/p wound vac change in OR on 11/10. S/p cortrak placement 11/9. PMH includes frequent falls, polypharmacy, neuropathy, dementia, chronic hyponatremia, HTN.   PT Comments    Pt slowly progressing with mobility. Today's session focused on seated activity tolerance; pt continues with heavy posterior lean and fear of falling, requiring initial maxA+2 for sitting balance, progressing to bouts of minA+1. Recommend maximove lift for OOB, but unsure how pt will tolerate this. Continue to recommend SNF-level therapies to maximize functional mobility and decrease caregiver burden; pending activity progressing, may require LTC/ALF. If pt to return home, will require totalA for majority of care, lift equipment with +2 assist.    Recommendations for follow up therapy are one component of a multi-disciplinary discharge planning process, led by the attending physician.  Recommendations may be updated based on patient status, additional functional criteria and insurance authorization.  Follow Up Recommendations  Skilled nursing-short term rehab (<3 hours/day)     Assistance Recommended at Discharge Frequent or constant Supervision/Assistance  Equipment Recommendations  Wheelchair;Wheelchair cushion;Hospital bed;Lift equipment   Recommendations for Other Services       Precautions / Restrictions Precautions Precautions: Fall;Other (comment) Precaution Comments: pt with recent SDH, frequent falls, delirium; sternal wound vac, cortrak; strong posterior  lean Restrictions Weight Bearing Restrictions: No     Mobility  Bed Mobility Overal bed mobility: Needs Assistance Bed Mobility: Supine to Sit;Sit to Supine     Supine to sit: Total assist;+2 for physical assistance;+2 for safety/equipment;HOB elevated Sit to supine: Max assist;+2 for physical assistance;+2 for safety/equipment   General bed mobility comments: TotalA+2 to come to sit EOB, pt with initial strong posterior lean requiring maxA to keep bilateral knees flexed and keep hips from scooting off EOB; pt initiating return to supine, maxA+2 for trunk and BLE management; totalA for scooting up and repositioning in bed    Transfers                   General transfer comment:  (deferred secondary to fatigue sitting EOB ~20-min, posterior lean and pt becoming restless/anxious with repositioning)    Ambulation/Gait                   Stairs             Wheelchair Mobility    Modified Rankin (Stroke Patients Only)       Balance Overall balance assessment: Needs assistance;History of Falls Sitting-balance support: Bilateral upper extremity supported;Feet supported Sitting balance-Leahy Scale: Poor Sitting balance - Comments: Initial posterior lean requiring maxA+2 to maintain static sitting EOB, progressing to minA+2 to maintain bilateral knee flexion and maintaining upright without trunk support; noted improving ability to translate weight anteriorly and lateral with time, also attempting to scoot self back on EOB with LUE support 1x Postural control: Posterior lean                                  Cognition Arousal/Alertness: Awake/alert;Lethargic Behavior During Therapy: Flat affect;Anxious;Restless Overall Cognitive Status:  Difficult to assess                                 General Comments: Pt initially with eyes open and waved hello to therapist with R hand; later keeping eyes closed majority of session; restless  and reports fearful of falling, becoming more relaxed while sitting EOB with support, encouragement and music. Able to state he wanted water and speech more clear after taking sips at end of session with max assist        Exercises      General Comments        Pertinent Vitals/Pain Pain Assessment: Faces Faces Pain Scale: Hurts a little bit Pain Location: grimacing with cortrak line repositioning Pain Descriptors / Indicators: Grimacing Pain Intervention(s): Monitored during session;Limited activity within patient's tolerance    Home Living                          Prior Function            PT Goals (current goals can now be found in the care plan section) Acute Rehab PT Goals Patient Stated Goal: Per chart, wife hopeful to have pt return home PT Goal Formulation: Patient unable to participate in goal setting Progress towards PT goals: Goals downgraded-see care plan    Frequency    Min 2X/week      PT Plan Current plan remains appropriate    Co-evaluation PT/OT/SLP Co-Evaluation/Treatment: Yes Reason for Co-Treatment: Necessary to address cognition/behavior during functional activity;Complexity of the patient's impairments (multi-system involvement);For patient/therapist safety          AM-PAC PT "6 Clicks" Mobility   Outcome Measure  Help needed turning from your back to your side while in a flat bed without using bedrails?: Total Help needed moving from lying on your back to sitting on the side of a flat bed without using bedrails?: Total Help needed moving to and from a bed to a chair (including a wheelchair)?: Total Help needed standing up from a chair using your arms (e.g., wheelchair or bedside chair)?: Total Help needed to walk in hospital room?: Total Help needed climbing 3-5 steps with a railing? : Total 6 Click Score: 6    End of Session   Activity Tolerance: Patient limited by fatigue;Other (comment) (Patient limited by cognitive  status and fear of falling) Patient left: in bed;with call bell/phone within reach;with SCD's reapplied Nurse Communication: Mobility status;Need for lift equipment PT Visit Diagnosis: Other abnormalities of gait and mobility (R26.89);Repeated falls (R29.6);Muscle weakness (generalized) (M62.81);History of falling (Z91.81)     Time: 4287-6811 PT Time Calculation (min) (ACUTE ONLY): 35 min  Charges:  $Therapeutic Activity: 8-22 mins                     Mabeline Caras, PT, DPT Acute Rehabilitation Services  Pager [phone] Office [place] 04/29/2021, 4:27 PM

## 2021-04-29 NOTE — TOC Progression Note (Signed)
Transition of Care [facility]) - Progression Note    Patient Details  Name: [name] MRN: [id number] Date of Birth: February 12, 1936  Transition of Care [facility]) CM/SW [place], RN Phone Number: 04/29/2021, 10:42 AM  Clinical Narrative:    Case management spoke with Marvetta Gibbons, CM and report received and patient placed on DTP Team to follow for Transitions of Care needs.  The patient continues to require hospitalization and care of medical needs at this time.   Expected Discharge Plan: [place] Barriers to Discharge: Continued Medical Work up  Expected Discharge Plan and Services Expected Discharge Plan: [place] In-house Referral: Clinical Social Work Discharge Planning Services: CM Consult Post Acute Care Choice: Home Health, Resumption of Svcs/PTA Provider (Currently active with [place]) Living arrangements for the past 2 months: [place]: RN, Disease Management, OT, PT, Social Work [employer] Agency: [place] ([place]) Date [place]: 04/24/21 Time [place]: 1518 Representative spoke with at [place]: [place] ([place]) Interventions    Readmission Risk Interventions No flowsheet data found.

## 2021-04-30 ENCOUNTER — Inpatient Hospital Stay (HOSPITAL_COMMUNITY): Payer: Medicare Other

## 2021-04-30 DIAGNOSIS — S21101A Unspecified open wound of right front wall of thorax without penetration into thoracic cavity, initial encounter: Secondary | ICD-10-CM | POA: Diagnosis not present

## 2021-04-30 DIAGNOSIS — E43 Unspecified severe protein-calorie malnutrition: Secondary | ICD-10-CM | POA: Diagnosis not present

## 2021-04-30 DIAGNOSIS — M869 Osteomyelitis, unspecified: Secondary | ICD-10-CM | POA: Diagnosis not present

## 2021-04-30 DIAGNOSIS — M4624 Osteomyelitis of vertebra, thoracic region: Secondary | ICD-10-CM | POA: Diagnosis not present

## 2021-04-30 DIAGNOSIS — I2690 Septic pulmonary embolism without acute cor pulmonale: Secondary | ICD-10-CM | POA: Diagnosis not present

## 2021-04-30 DIAGNOSIS — R140 Abdominal distension (gaseous): Secondary | ICD-10-CM | POA: Diagnosis not present

## 2021-04-30 DIAGNOSIS — R7881 Bacteremia: Secondary | ICD-10-CM | POA: Diagnosis not present

## 2021-04-30 DIAGNOSIS — B9562 Methicillin resistant Staphylococcus aureus infection as the cause of diseases classified elsewhere: Secondary | ICD-10-CM | POA: Diagnosis not present

## 2021-04-30 LAB — BASIC METABOLIC PANEL
Anion gap: 6 (ref 5–15)
BUN: 26 mg/dL — ABNORMAL HIGH (ref 8–23)
CO2: 27 mmol/L (ref 22–32)
Calcium: 9 mg/dL (ref 8.9–10.3)
Chloride: 112 mmol/L — ABNORMAL HIGH (ref 98–111)
Creatinine, Ser: 0.72 mg/dL (ref 0.61–1.24)
GFR, Estimated: >60 mL/min (ref >60)
Glucose, Bld: 128 mg/dL — ABNORMAL HIGH (ref 70–99)
Potassium: 3.5 mmol/L (ref 3.5–5.1)
Sodium: 145 mmol/L (ref 135–145)

## 2021-04-30 LAB — GLUCOSE, CAPILLARY
Glucose-Capillary: 104 mg/dL — ABNORMAL HIGH (ref 70–99)
Glucose-Capillary: 128 mg/dL — ABNORMAL HIGH (ref 70–99)
Glucose-Capillary: 101 mg/dL — ABNORMAL HIGH (ref 70–99)
Glucose-Capillary: 96 mg/dL (ref 70–99)
Glucose-Capillary: 102 mg/dL — ABNORMAL HIGH (ref 70–99)

## 2021-04-30 LAB — MAGNESIUM: Magnesium: 1.9 mg/dL (ref 1.7–2.4)

## 2021-04-30 MED ORDER — KCL IN DEXTROSE-NACL 20-5-0.9 MEQ/L-%-% IV SOLN
INTRAVENOUS | Status: DC
  Filled 2021-04-30 (×4): qty 1000

## 2021-04-30 MED ORDER — FLEET ENEMA 7-19 GM/118ML RE ENEM
1.0000 | ENEMA | Freq: Once | RECTAL | Status: AC
  Administered 2021-04-30: 1 via RECTAL
  Filled 2021-04-30: qty 1

## 2021-04-30 MED ORDER — LORAZEPAM 2 MG/ML IJ SOLN
1.0000 mg | Freq: Once | INTRAMUSCULAR | Status: AC
  Administered 2021-04-30: 1 mg via INTRAVENOUS
  Filled 2021-04-30: qty 1

## 2021-04-30 MED ORDER — POLYETHYLENE GLYCOL 3350 17 G PO PACK
17.0000 g | PACK | Freq: Two times a day (BID) | ORAL | Status: DC
  Administered 2021-04-30 – 2021-05-01 (×2): 17 g
  Filled 2021-04-30 (×3): qty 1

## 2021-04-30 MED ORDER — IOHEXOL 350 MG/ML SOLN
100.0000 mL | Freq: Once | INTRAVENOUS | Status: AC | PRN
  Administered 2021-04-30: 100 mL via INTRAVENOUS

## 2021-04-30 MED ORDER — BISACODYL 10 MG RE SUPP
10.0000 mg | Freq: Two times a day (BID) | RECTAL | Status: DC
  Administered 2021-04-30 – 2021-05-01 (×2): 10 mg via RECTAL
  Filled 2021-04-30 (×3): qty 1

## 2021-04-30 NOTE — Progress Notes (Signed)
      [place] 411       [place] [address]             [phone]      8 Days Post-Op Procedure(s) (LRB): STERNAL WOUND WASHOUT (N/A) WOUND VAC CHANGE (N/A) Subjective: [name] is awake and responds to voice but is incoherent.  He is receiving TF via CorTrak feeding tube.   Objective: Vital signs in last 24 hours: Temp:  [97.9 F (36.6 C)-99.4 F (37.4 C)] 98.8 F (37.1 C) (11/18 1000) Pulse Rate:  [97-108] 101 (11/18 1000) Cardiac Rhythm: Sinus tachycardia (11/18 0743) Resp:  [17-23] 21 (11/18 1000) BP: (125-152)/(50-101) 143/88 (11/18 1000) SpO2:  [96 %-100 %] 97 % (11/18 1000) Weight:  [94.8 kg] 94.8 kg (11/18 0500)    Intake/Output from previous day: 11/17 0701 - 11/18 0700 In: 1152.2 [I.V.:750.1; IV Piggyback:402.1] Out: 425 [Urine:425] Intake/Output this shift: Total I/O In: -  Out: 1750 [Urine:1750]  Heart: regular rate and rhythm Wound: negative pressure chest dressing is intact and functioning appropriately. The Wound vac suction cannister is about 75% filled with thin serosanguinous drainage.   Lab Results: Recent Labs    04/28/21 0119 04/29/21 1105  WBC 11.2* 9.8  HGB 7.8* 7.3*  HCT 24.4* 23.8*  PLT 510* 481*    BMET:  Recent Labs    04/29/21 1105 04/30/21 0153  NA 142 145  K 3.3* 3.5  CL 109 112*  CO2 25 27  GLUCOSE 133* 128*  BUN 24* 26*  CREATININE 0.73 0.72  CALCIUM 8.9 9.0     PT/INR: No results for input(s): LABPROT, INR in the last 72 hours. ABG    Component Value Date/Time   PHART 7.443 04/19/2021 1854   HCO3 19.9 (L) 04/19/2021 1854   ACIDBASEDEF 3.4 (H) 04/19/2021 1854   O2SAT 95.6 04/19/2021 1854   CBG (last 3)  Recent Labs    04/30/21 0417 04/30/21 0830 04/30/21 1220  GLUCAP 104* 128* 101*     Assessment/Plan: S/P Procedure(s) (LRB): STERNAL WOUND WASHOUT (N/A) WOUND VAC CHANGE (N/A)  Open mid-sternal wound s/p excisional debridement on 04/16/2021 after presenting with substernal abscess  and MRSA bacteremia. The wound vac dressing was changed at the bedside today with the wound care RN assisting.  The wound was found to be clean and slowly granulating but still has a 4x4cm area of exposed pericardium / heart with no evidence of tissue breakdown. No further debridement indicated.  The vac dressing was changed using 4 pieces of white foam and one piece of black foam.  Would prefer to see the base of the wound (pericardium) covered with granulation before transitioning to outpatient wound care.  ABX per ID. Nutritional deficits are being addressed.  We will continue to follow. Plan dressing change on Monday.      LOS: 12 days    Antony Odea, [place] [phone] 04/30/2021

## 2021-04-30 NOTE — Care Management Important Message (Signed)
Important Message  Patient Details  Name: [name] MRN: [id number] Date of Birth: December 15, 1935   Medicare Important Message Given:  Yes     Shelda Altes 04/30/2021, 11:02 AM

## 2021-04-30 NOTE — Progress Notes (Signed)
Fleet enema given as ordered. Patient tolerated well.  Very small amount of bowel came out as enema was given. Some solution came out as well. Abdomen is still distended.   Patient cleaned up, barrier cream applied. Will continue to monitor

## 2021-04-30 NOTE — Progress Notes (Addendum)
[facility] Infectious Diseases Follow Up Note  Patient Identification: Patient Name: [name] MRN: [id number] [place] Date: 04/21/2021  4:57 AM Age: 85 y.o.Today's Date: 04/30/2021   Reason for Visit: MRSA bacteremia  Principal Problem:   Sepsis due to methicillin resistant Staphylococcus aureus (MRSA) ([place]) Active Problems:   History of prostate cancer   Essential hypertension   CAD (coronary artery disease)   Hyperlipidemia, mixed   Lumbar compression fracture (HCC)   Atrial fibrillation with RVR (HCC)   Sternal wound infection   SDH (subdural hematoma)   Pneumonia involving right lung   Delirium   DNR (do not resuscitate)   Goals of care, counseling/discussion   Osteomyelitis of thoracic region ([place])   Protein-calorie malnutrition, severe   Sternal osteomyelitis ([place])   Acute septic pulmonary embolism ([place])   Abdominal distention   Antibiotics: Vancomycin 11/5-current                     Zosyn 11/5- 11/6  Lines/Hardwares: PICC in rt arm  Interval Events: one episode of low garde fevers on 11/16 otherwsie afebrile,  no significant changes, Concern for ileus.   Assessment # MRSA bacteremia: TTE with no vegetations. Repeat blood cx 11/7 no growth ( final). Refused TEE ( poor TEE and surgical candidate)  # Sternal fracture with phlegmon/Osteomyelitis  -     s/p sternal I and D on 11/8. OR cultures growing MRSA -     s/p repeat excisional debridement and wound vac change on 11/10 -     Wound Vac change at bedside by CT Surgery 11/15  "the wound is growing granulation tissue.  The wound extends below the sternum with pericardium/heart exposure". Per CT surgery, not a candidate for home wound vac change  -      planned for repeat Wound vac change today   # Ileus - Management per primary   # Recent Falls with SDH with concerns of acute infarct and  hydrocephalus per NeuroSx- no surgical intervention per Neuro SX  # Encephalopathy/Delirium: No significant improvement in mental status, he is oriented to person only per RN  # Possible Pulmonary septic emboli  # Thoraco-Lumbar compression fractures vs Osteomyelitis - No surgical intervention per NeuroSX   Recommendations Continue Vancomycin, pharmacy to dose Plan for 6 weeks course from last OR visit 11/10.  Monitor CBC, BMP and Vancomycin trough Wound Vac change per CT surgery  Management of Ileus per primary  Fu palliative care recs, wife wants aggressive medical care and wants to take home as soon as possible but patient is not medically cleared for discharge home ID available as needed. Please call with questions   OPAT orders  Diagnosis: Complicated MRSA bacteremia/Possible endocarditis   Culture Result: MRSA  Allergies  Allergen Reactions   Codeine Nausea And Vomiting   Indocin [Indomethacin] Other (See Comments)   Latex Other (See Comments)    Blisters   Mupirocin     Blisters   Plavix [Clopidogrel Bisulfate]     GI intolerance, n/v   Polysporin [Bacitracin-Polymyxin B]     Blisters   Shellfish Allergy Nausea And Vomiting  Mussels   Tape    Neosporin [Neomycin-Bacitracin Zn-Polymyx] Rash   Other Nausea And Vomiting    mussels    OPAT Orders Discharge antibiotics to be given via PICC line Discharge antibiotics: Per pharmacy protocol  Aim for Vancomycin trough 15-20 or AUC 400-550 (unless otherwise indicated) Duration: 6 weeks  End Date: 06/03/21  [facility] Care Per Protocol:  Home health RN for IV administration and teaching; PICC line care and labs.    Labs weekly while on IV antibiotics: X__ CBC with differential X__ BMP __ CMP X__ CRP X__ ESR X__ Vancomycin trough __ CK  X__ Please pull PIC at completion of IV antibiotics __ Please leave PIC in place until doctor has seen patient or been notified  Fax weekly labs to [phone]  Clinic  Follow Up Appt: 05/13/21  $Remove'@3'WnGtQpP$ :45 pm   Rest of the management as per the primary team. Thank you for the consult. Please page with pertinent questions or concerns.  ______________________________________________________________________ Subjective patient seen and examined at the bedside. His eyes are closed and does not respond much   BP 134/74 (BP Location: Left Arm)   Pulse 100   Temp 99.4 F (37.4 C) (Axillary)   Resp 20   Ht 6' 0.01" (1.829 m)   Wt 94.8 kg   SpO2 100%   BMI 28.34 kg/m     Physical Exam Constitutional:  lying in bed with closed eyes    Comments: Dub hoff in place  Cardiovascular:     Rate and Rhythm: Normal rate and regular rhythm.     Heart sounds: wound vac in place in the anterior chest  Pulmonary:     Effort: Pulmonary effort is normal.     Comments:   Abdominal:     Palpations: Abdomen is soft.     Tenderness: Non tender and distended   Musculoskeletal:        General: No swelling and tenderness in peripheral joints   Skin:    Comments:   Neurological:     General: unable to assess  Psychiatric:        Mood and Affect:unable to assess  Pertinent Microbiology Results for orders placed or performed during the hospital encounter of 04/13/2021  Culture, blood (routine x 2)     Status: None   Collection Time: 04/19/21  7:55 AM   Specimen: BLOOD LEFT WRIST  Result Value Ref Range Status   Specimen Description BLOOD LEFT WRIST  Final   Special Requests   Final    BOTTLES DRAWN AEROBIC AND ANAEROBIC Blood Culture adequate volume   Culture   Final    NO GROWTH 5 DAYS Performed at [facility], [address], [place], [place] [address]    Report Status 04/24/2021 FINAL  Final  Culture, blood (routine x 2)     Status: None   Collection Time: 04/19/21  8:02 AM   Specimen: BLOOD LEFT HAND  Result Value Ref Range Status   Specimen Description BLOOD LEFT HAND  Final   Special Requests   Final    BOTTLES DRAWN AEROBIC AND ANAEROBIC  Blood Culture adequate volume   Culture   Final    NO GROWTH 5 DAYS Performed at [facility], [place] [address] [place], [place] [address]    Report Status 04/24/2021 FINAL  Final  MRSA Next Gen by PCR, Nasal     Status: Abnormal   Collection Time: 04/19/21 11:13 AM   Specimen: Nasal Mucosa; Nasal Swab  Result Value Ref Range Status   MRSA by PCR Next Gen DETECTED (A) NOT DETECTED Final    Comment: RESULT CALLED TO, READ BACK BY AND VERIFIED WITH: D WHITE RN 1503 04/19/21 A [place] (NOTE) The GeneXpert MRSA Assay (FDA approved for NASAL specimens only), is one component of a comprehensive MRSA colonization surveillance program. It is not intended to diagnose MRSA infection nor to guide or monitor treatment for MRSA infections. Test performance is not FDA approved in patients less than 31 years old. Performed at [facility], [place] [address], [place], [place] [address]   Aerobic/Anaerobic Culture w Gram Stain (surgical/deep wound)     Status: None   Collection Time: 04/14/2021  8:23 AM   Specimen: Wound; Body Fluid  Result Value Ref Range Status   Specimen Description WOUND  Final   Special Requests  STERNAL WOUND  Final   Gram Stain   Final    FEW WBC PRESENT,BOTH PMN AND MONONUCLEAR FEW GRAM POSITIVE COCCI IN PAIRS    Culture   Final    MODERATE METHICILLIN RESISTANT STAPHYLOCOCCUS AUREUS NO ANAEROBES ISOLATED Performed at [facility], [place] [address] [place], [place] [address]    Report Status 04/25/2021 FINAL  Final   Organism ID, Bacteria METHICILLIN RESISTANT STAPHYLOCOCCUS AUREUS  Final      Susceptibility   Methicillin resistant staphylococcus aureus - MIC*    CIPROFLOXACIN >=8 RESISTANT Resistant     ERYTHROMYCIN <=0.25 SENSITIVE Sensitive     GENTAMICIN <=0.5 SENSITIVE Sensitive     OXACILLIN >=4 RESISTANT Resistant     TETRACYCLINE <=1 SENSITIVE Sensitive     VANCOMYCIN 1 SENSITIVE Sensitive     TRIMETH/SULFA <=10 SENSITIVE Sensitive      CLINDAMYCIN <=0.25 SENSITIVE Sensitive     RIFAMPIN <=0.5 SENSITIVE Sensitive     Inducible Clindamycin NEGATIVE Sensitive     * MODERATE METHICILLIN RESISTANT STAPHYLOCOCCUS AUREUS  Aerobic/Anaerobic Culture w Gram Stain (surgical/deep wound)     Status: None   Collection Time: 04/17/2021  8:27 AM   Specimen: PATH Bone biopsy; Tissue  Result Value Ref Range Status   Specimen Description TISSUE  Final   Special Requests  STERNUM  Final   Gram Stain   Final    FEW WBC PRESENT,BOTH PMN AND MONONUCLEAR FEW GRAM POSITIVE COCCI IN PAIRS    Culture   Final    MODERATE METHICILLIN RESISTANT STAPHYLOCOCCUS AUREUS NO ANAEROBES ISOLATED Performed at [facility], [place] [address] [place], [place] [address]    Report Status 04/25/2021 FINAL  Final   Organism ID, Bacteria METHICILLIN RESISTANT STAPHYLOCOCCUS AUREUS  Final      Susceptibility   Methicillin resistant staphylococcus aureus - MIC*    CIPROFLOXACIN >=8 RESISTANT Resistant     ERYTHROMYCIN <=0.25 SENSITIVE Sensitive     GENTAMICIN <=0.5 SENSITIVE Sensitive     OXACILLIN >=4 RESISTANT Resistant     TETRACYCLINE <=1 SENSITIVE Sensitive     VANCOMYCIN 1 SENSITIVE Sensitive     TRIMETH/SULFA <=10 SENSITIVE Sensitive     CLINDAMYCIN <=0.25 SENSITIVE Sensitive     RIFAMPIN <=0.5 SENSITIVE Sensitive     Inducible Clindamycin NEGATIVE Sensitive     * MODERATE METHICILLIN RESISTANT STAPHYLOCOCCUS AUREUS    Pertinent Lab. CBC Latest Ref Rng & Units 04/29/2021 04/28/2021 05/04/2021  WBC 4.0 - 10.5 K/uL 9.8 11.2(H) 15.8(H)  Hemoglobin 13.0 - 17.0 g/dL 7.3(L) 7.8(L) 8.5(L)  Hematocrit 39.0 - 52.0 % 23.8(L) 24.4(L) 26.2(L)  Platelets 150 -  400 K/uL 481(H) 510(H) 523(H)   CMP Latest Ref Rng & Units 04/30/2021 04/29/2021 04/28/2021  Glucose 70 - 99 mg/dL 128(H) 133(H) 117(H)  BUN 8 - 23 mg/dL 26(H) 24(H) 20  Creatinine 0.61 - 1.24 mg/dL 0.72 0.73 0.58(L)  Sodium 135 - 145 mmol/L 145 142 138  Potassium 3.5 - 5.1 mmol/L 3.5 3.3(L)  3.4(L)  Chloride 98 - 111 mmol/L 112(H) 109 106  CO2 22 - 32 mmol/L $RemoveB'27 25 25  '[id number]$ Calcium 8.9 - 10.3 mg/dL 9.0 8.9 8.7(L)  Total Protein 6.5 - 8.1 g/dL - 6.3(L) 6.0(L)  Total Bilirubin 0.3 - 1.2 mg/dL - 0.7 0.3  Alkaline Phos 38 - 126 U/L - 81 77  AST 15 - 41 U/L - 15 13(L)  ALT 0 - 44 U/L - 14 13     Pertinent Imaging today Plain films and CT images have been personally visualized and interpreted; radiology reports have been reviewed. Decision making incorporated into the Impression / Recommendations.  I spent more than 40 minutes for this patient encounter including review of prior medical records, coordination of care  with greater than 50% of time being face to face/counseling and discussing diagnostics/treatment plan with the patient/family.  Electronically signed by:   Rosiland Oz, MD Infectious Disease Physician [facility] for Infectious Disease Pager: [phone]

## 2021-04-30 NOTE — Progress Notes (Signed)
Patient seen this AM.  Abdomen distended and bowel sounds still diminished.  S/p suppository/bowel regimen.  CT scan has been ordered.  Tube feeds on hold for now.  No vomiting.  Patient will respond to voice Marcus Bear DO

## 2021-04-30 NOTE — Progress Notes (Signed)
Palliative Care Progress Note  Mr. Blaylock continues to languish here in the hospital with minimal to no improvement since admission, despite multiple aggressive medical interventions. He was admitted to [facility] after a fall at home where he was found to have a SDH. 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 He also has Septic emboli on Chest CT. His baseline status was very debilitated and he already had known cognitive impairment, multiple falls at home and failure to thrive. He is currently able to respond to voice but does not communicate -I suspect he is in a considerable amount of pain given his innumerable chronic fractures and open chest wound. Today he is not tolerating tube feeding and having vomiting and a distended abdomen-another turn for the worst for him. CT shows that he has large stool burden.  I do not believe that his current medical problems are survivable in the long run and most of our current treatment interventions are prolonging his life in a very poor condition with a considerable amount of pain and suffering-there does not appear to be a good outcome possible here.   It is my opinion that he has illness and injury that will result in his death in a relative short period of time. We should schedule a meeting with members of the medical care team and his wife, caregiver and other family support if available to discuss goals of care in the setting of continued decline, especially now that he is unable to tolerate artifical feeding. He will likely not survive this hospitalization and we need to consider a comfort care transition at this point -comfort care is not only the right thing to do medically, but the most compassionate and humane plan of care for him.  Lane Hacker, DO Palliative Medicine  Time: 35  min Greater than 50%  of this time was spent counseling and coordinating care related to the above assessment and plan.

## 2021-04-30 NOTE — Progress Notes (Signed)
[facility] PROGRESS NOTE  [name] GYF:[id number] DOB: [birth year] DOA: 05/03/2021 PCP: [name], DO  Status: Remains inpatient appropriate because:  Not medically stable to discharge due to open chest wound with exposed pericardium requiring inpatient wound VAC management; unsafe discharge plan  Barriers to discharge: Social: Wife does not wish to bring patient home with feeding tube in place that he has not managed appropriate oral intake and is currently requiring enteral feeds to maintain appropriate nutrition for wound healing  Clinical: Continued requirement for inpatient wound VAC management as described above; poor oral intake requiring supplemental enteral feeds via core track tube  Level of care:  Progressive   Code Status: DNR Family Communication: Patient only-only at bedside on 11/17 DVT prophylaxis:  COVID vaccination status:  Foley catheter: Yes POA: Yes -placed at [facility] 04/17/2021  HPI: 85 y.o. male with medical history significant of HTN; HLD; remote prostate CA; CAD s/p remote CABG; and abdominal wall melanoma.24 hours prior to presentation had been discharged from [facility] with recommendation for SNF placement but spouse refused and requested patient be discharged home with 24/7 care.  He presented to [facility] on 04/17/21 with sepsis, presenting with fever, encephalopathy, respiratory distress and A-fib RVR.  Transferred to [facility] on 04/14/2021 for CT surgery coverage due to a mediastinal fluid collection associated with transverse sternal fracture.  Patient found to have Staph bacteremia with findings of pulmonary septic emboli on CT scan.   Infectious disease, CT surgery, neurosurgery, palliative care consulted.  Patient's wife has not been willing to de-escalate care.  Cardiothoracic continues to follow for chest wound.  He remains on NG tube feedings through cortrak.  Poor overall prognosis.   Subjective: Slightly more restless  today.  Was able to communicate that he was having abdominal pain today.  Objective: Vitals:   04/29/21 2342 04/30/21 0413  BP: (!) 152/101 134/74  Pulse: 97 100  Resp: 17 20  Temp: 97.9 F (36.6 C) 99.4 F (37.4 C)  SpO2: 98% 100%    Intake/Output Summary (Last 24 hours) at 04/30/2021 0748 Last data filed at 04/30/2021 9163 Gross per 24 hour  Intake 1152.23 ml  Output 1775 ml  Net -622.77 ml   Filed Weights   04/24/21 0409 04/27/21 0331 04/30/21 0500  Weight: 93.1 kg 89.2 kg 94.8 kg    Exam:  Constitutional: Appears mildly distressed likely due to apparent abdominal discomfort Respiratory: clear to auscultation bilaterally but decreased in bases in context of significantly distended abdomen, Normal respiratory effort. No accessory muscle use.  Room air, O2 sats between 93 and 98% Cardiovascular: Regular rate and rhythm, S1-S2, no extremity edema.  Extremities mottled.  Pedal pulses weak.  Normotensive otherwise Abdomen: Markedly distended and tympanitic with absent bowel sounds no tenderness palpated. Bowel sounds positive.  LBM 11/17 after multiple doses of Chronulac and Dulcolax suppository Neurologic: CN 2-12 grossly intact. Sensation intact, Strength 5/5 x all 4 extremities.  Psychiatric: Lethargic, Briefly did respond to questions asked but no meaningful engagement in conversation otherwise but does admit to abdominal pain during abdominal exam.  Require safety mittens  Assessment/Plan: Acute problems: MRSA bacteremia with severe sepsis POA/septic emboli Sepsis POA with leukocytosis, tachycardia, tachypnea, hypoxia.  Evidence of acute organ failure with elevated lactate >2 that is not easily explained by another condition.  Sepsis physiology has resolved Suspected source is unknown - PNA, spinal osteo, recent skin surgery are considerations (see below) Infectious disease was consulted.  LD Vanco 6 weeks from last OR  visit 11/10 TTE negative for endocarditis. TEE was  considered but wife does not want him to have this procedure-  per CTS would not change management as patient needs long-term IV antibiotics.     Anterior mediastinal abscess/phlegmon w/ transverse sternal fx  Cardiothoracic surgery was consulted. Patient taken to OR for I&D on 11/8 and wound VAC placed. Repeat VAC change 11/10 Wound VAC changed at bedside continued exposure of pericardium/heart which excludes him from home health wound VAC changes  Abdominal distention Abdominal distention persists and appears to be worse than on 11/17.  Now having abdominal discomfort and bowel sounds are not auscultated 2-view portable abdominal films: Imaging consistent with ileus with retained stool -did have at least 1 bowel movement after lactulose 20 g per tube TID x 3 doses and after a Dulcolax suppository CRP elevated but electrolyte panel, CBC and lactic acid within normal ranges for this patient. Given persistent abdominal distention we will stop tube feedings and only allow medications per tube.  We will begin maintenance IV fluids for hydration.  We will obtain CT abdomen and pelvis with and without contrast to better characterize underlying pathology and begin Dulcolax suppositories twice daily   Subdural Hematoma (SDH) Patient has had recent SDH x 2, acute on chronic, seen at [facility].  This was secondary to multiple falls. Also with compression fractures Anticoagulation and ASA are contraindicated at this time.   New onset A-fib - POA, Likely triggered by infection.  Anticoagulation contraindicated in setting of SDH. Maintaining sinus rhythm/intermittent sinus tachycardia on Cardizem   Delirium/acute metabolic encephalopathy Most likely due to infection, unclear if recent traumatic SDH contributing.  Continue to hold sedating meds: Primidone, benadryl (consider d/c), Neurontin   Right midlung PNA vs Septic Emboli  Appears to have septic emboli on imaging. Treated with Vanc/Zosyn on  admission. Zosyn d/c'd per ID Continue Vancomycin as recommended by ID   Hypokalemia  Potassium repleted. Maintenance IV fluid will contain potassium   Oropharyngeal dysphagia/severe protein calorie malnutrition Continue dysphagia 1 diet.  Oral intake is poor.   Plan is to continue with tube feedings for now via cortrack. Wife has stated that she does not want husband to come home with feeding tube in place Nutrition Problem: Severe Malnutrition Etiology: chronic illness (CAD) Signs/Symptoms: severe fat depletion, severe muscle depletion Interventions: Ensure Enlive (each supplement provides 350kcal and 20 grams of protein), MVI  @@11 /18 given abdominal distention with absent bowel sounds we will hold nocturnal tube feedings for now and utilize maintenance IV fluids for hydration@@   Thoracolumbar compression fractures with ? Osteomyelitis  No surgical intervention planned.  Seen by neurosurgery.  On antibiotics per ID.   Abdominal wall melanoma -Path reports from excision reflect negative margins.   Left AC superficial thrombophlebitis  Conservative management.  No DVT is noted on imaging studies.   Macrocytic anemia Drop in hemoglobin likely dilutional.  Hemoglobin is stable for the most part.  No overt bleeding noted.   Anemia panel reviewed.  No clear-cut deficiencies identified.   TSH is noted to be 1.2 with a normal free T4.   Essential hypertension  Currently on Cardizem.  Blood pressure is stable.   Hyperlipidemia Resume statin at discharge.   Remote Prostate CA  Bladder wall thickening noted on imaging.  Monitor for now.  Outpatient follow-up with urology.  Has a Foley catheter which will remain in place at discharge.   Coronary artery disease s/p remote CABG   Goals of care Palliative care has been  consulted since patient's prognosis appears to be poor.  They have had conversations with patient's wife.  Patient has a son who lives out of town.    Multiple  discussions held with patient's wife.  patient's wife mentioned to palliative care that she will be unable to take care of him at home but also is reluctant for him to go to skilled nursing facility.  It does not appear that she fully understands the seriousness of the situation.  Patient's prognosis remains poor.  He seems to be gradually declining. She wants him to continue getting aggressive medical therapy.  At this time patient is unsafe for discharge to either home or even to skilled nursing facility.      Data Reviewed: Basic Metabolic Panel: Recent Labs  Lab 04/25/21 0329 04/29/2021 0125 04/28/21 0119 04/29/21 1105 04/30/21 0153  NA 138 138 138 142 145  K 3.4* 3.7 3.4* 3.3* 3.5  CL 105 107 106 109 112*  CO2 26 25 25 25 27   GLUCOSE 120* 134* 117* 133* 128*  BUN 16 18 20  24* 26*  CREATININE 0.64 0.58* 0.58* 0.73 0.72  CALCIUM 8.5* 8.8* 8.7* 8.9 9.0   Liver Function Tests: Recent Labs  Lab 04/28/21 0119 04/29/21 1105  AST 13* 15  ALT 13 14  ALKPHOS 77 81  BILITOT 0.3 0.7  PROT 6.0* 6.3*  ALBUMIN 1.9* 1.9*   No results for input(s): LIPASE, AMYLASE in the last 168 hours. Recent Labs  Lab 04/28/21 0119  AMMONIA 25   CBC: Recent Labs  Lab 04/24/21 0236 04/25/21 0329 04/29/2021 0125 04/28/21 0119 04/29/21 1105  WBC 14.4* 14.2* 15.8* 11.2* 9.8  NEUTROABS  --   --   --   --  8.3*  HGB 9.5* 8.6* 8.5* 7.8* 7.3*  HCT 28.7* 26.5* 26.2* 24.4* 23.8*  MCV 104.7* 104.7* 105.6* 106.1* 107.2*  PLT 540* 537* 523* 510* 481*    CBG: Recent Labs  Lab 04/29/21 1104 04/29/21 1648 04/29/21 2005 04/29/21 2339 04/30/21 0417  GLUCAP 117* 110* 136* 125* 104*    Recent Results (from the past 240 hour(s))  Aerobic/Anaerobic Culture w Gram Stain (surgical/deep wound)     Status: None   Collection Time: 05/03/2021  8:23 AM   Specimen: Wound; Body Fluid  Result Value Ref Range Status   Specimen Description WOUND  Final   Special Requests  STERNAL WOUND  Final   Gram Stain    Final    FEW WBC PRESENT,BOTH PMN AND MONONUCLEAR FEW GRAM POSITIVE COCCI IN PAIRS    Culture   Final    MODERATE METHICILLIN RESISTANT STAPHYLOCOCCUS AUREUS NO ANAEROBES ISOLATED Performed at [facility], [place] [address] [place], [place] [address]    Report Status 04/25/2021 FINAL  Final   Organism ID, Bacteria METHICILLIN RESISTANT STAPHYLOCOCCUS AUREUS  Final      Susceptibility   Methicillin resistant staphylococcus aureus - MIC*    CIPROFLOXACIN >=8 RESISTANT Resistant     ERYTHROMYCIN <=0.25 SENSITIVE Sensitive     GENTAMICIN <=0.5 SENSITIVE Sensitive     OXACILLIN >=4 RESISTANT Resistant     TETRACYCLINE <=1 SENSITIVE Sensitive     VANCOMYCIN 1 SENSITIVE Sensitive     TRIMETH/SULFA <=10 SENSITIVE Sensitive     CLINDAMYCIN <=0.25 SENSITIVE Sensitive     RIFAMPIN <=0.5 SENSITIVE Sensitive     Inducible Clindamycin NEGATIVE Sensitive     * MODERATE METHICILLIN RESISTANT STAPHYLOCOCCUS AUREUS  Aerobic/Anaerobic Culture w Gram Stain (surgical/deep wound)  Status: None   Collection Time: 05/08/2021  8:27 AM   Specimen: PATH Bone biopsy; Tissue  Result Value Ref Range Status   Specimen Description TISSUE  Final   Special Requests  STERNUM  Final   Gram Stain   Final    FEW WBC PRESENT,BOTH PMN AND MONONUCLEAR FEW GRAM POSITIVE COCCI IN PAIRS    Culture   Final    MODERATE METHICILLIN RESISTANT STAPHYLOCOCCUS AUREUS NO ANAEROBES ISOLATED Performed at [facility], [place] [address] [place], [place] [address]    Report Status 04/25/2021 FINAL  Final   Organism ID, Bacteria METHICILLIN RESISTANT STAPHYLOCOCCUS AUREUS  Final      Susceptibility   Methicillin resistant staphylococcus aureus - MIC*    CIPROFLOXACIN >=8 RESISTANT Resistant     ERYTHROMYCIN <=0.25 SENSITIVE Sensitive     GENTAMICIN <=0.5 SENSITIVE Sensitive     OXACILLIN >=4 RESISTANT Resistant     TETRACYCLINE <=1 SENSITIVE Sensitive     VANCOMYCIN 1 SENSITIVE Sensitive     TRIMETH/SULFA <=10  SENSITIVE Sensitive     CLINDAMYCIN <=0.25 SENSITIVE Sensitive     RIFAMPIN <=0.5 SENSITIVE Sensitive     Inducible Clindamycin NEGATIVE Sensitive     * MODERATE METHICILLIN RESISTANT STAPHYLOCOCCUS AUREUS     Scheduled Meds:  acetaminophen  1,000 mg Per Tube Q6H   azelastine  1 spray Each Nare BID   chlorhexidine  15 mL Mouth Rinse BID   Chlorhexidine Gluconate Cloth  6 each Topical Daily   diltiazem  60 mg Per Tube Q6H   docusate  100 mg Per Tube BID   feeding supplement (PROSource TF)  45 mL Per Tube BID   lactulose  20 g Per Tube TID   mouth rinse  15 mL Mouth Rinse q12n4p   sodium chloride flush  10-40 mL Intracatheter Q12H   sodium chloride flush  3 mL Intravenous Q12H   Continuous Infusions:  0.45 % NaCl with KCl 20 mEq / L 10 mL/hr at 04/29/21 0604   feeding supplement (JEVITY 1.5 CAL/FIBER) 1,000 mL (04/28/21 2121)   vancomycin 1,000 mg (04/29/21 0851)    Principal Problem:   Sepsis due to methicillin resistant Staphylococcus aureus (MRSA) (HCC) Active Problems:   History of prostate cancer   Essential hypertension   CAD (coronary artery disease)   Hyperlipidemia, mixed   Lumbar compression fracture (HCC)   Atrial fibrillation with RVR (HCC)   Sternal wound infection   SDH (subdural hematoma)   Pneumonia involving right lung   Delirium   DNR (do not resuscitate)   Goals of care, counseling/discussion   Osteomyelitis of thoracic region ([place])   Protein-calorie malnutrition, severe   Sternal osteomyelitis ([place])   Acute septic pulmonary embolism ([place])   Abdominal distention   Consultants: Infectious disease Palliative medicine team Cardiothoracic surgery Neurosurgery  Procedures: Echocardiogram Core track tube placement Central line placement Sternal wound irrigation and debridement 94/0 with application of wound VAC Sternal washout with wound VAC change 11/10  Antibiotics: Vancomycin 11/5 >>> Zosyn 11/5 through 11/6   Time spent: 35  minutes    Erin Hearing ANP  [facility] 7 am - 330 pm/M-F for direct patient care and secure chat Please refer to Amion for contact info 12  days

## 2021-04-30 NOTE — Consult Note (Signed)
[facility] Nurse Consult Note: Reason for Consult: Sternal wound with exposed pericardium. CVTS PA-C Enid Cutter was instrumental in this dressing change procedure today. Wound type: Surgical Pressure Injury POA: N/A Measurement:7cm x 4cm with depth not measured. Exposed pericardium in the wound bed. Wound bed: pale pink, undermining from 7-11 and from 1-4 o'clock. Drainage (amount, consistency, odor) serosanguinous to serous. Brown in [place]. Periwound: intact in the immediate periwound, small area of medical adhesive related skin injury (MASRI) noted at edge of drape at 1 o'clock with small bead of blood.  Dressing procedure/placement/frequency: Mr. Michael Litter took down the old dressing gently and slowly, saturating the black and white foams and allowing to soak. Two pieces of white foam and two pieces of black foam removed. Wound gently irrigated with NS. Four (4) pieces of white foam are used to fill undermined areas at 7-11 o'clock and 1-4 o'clock, one piece is placed in the central wound over the pericardium and another placed on top of that to fill defect to overall wound surface level. A piece of black foam is then used to cover the wound defect. The periwound skin is protected with two layers of Cavillon liquid barrier film and this is allowed to dry. The drape is then applied and an opening cut into the center. This is covered with the T.R.A.C.C. pad and the dressing is attached to 138mHg continuous negative pressure.  AN immediate seal is achieved. The patient tolerated the procedure well.  The next dressing change is due on Monday, 05/03/21.  Supplies at the bedside are: white foam, a black foam medium dressing kit, Cavillon skin barrier wipes, saline flushes and a scissors and forceps. [place] may either change dressing independently or contact the CVTS PA-C for assistance. Mr. RMichael Litterindicates that he is available on Monday and that his cell number is [phone] WR[place] BottomNurse may  call or text to determine a time for this dressing change if his assistance is desired.  [place] will follow, and will remain available to this patient, the nursing and medical teams.   Thanks, LMaudie Flakes MSN, RN, [place] CArther Abbott Pager# [phone]

## 2021-04-30 NOTE — Plan of Care (Signed)
  Problem: Health Behavior/Discharge Planning: Goal: Ability to manage health-related needs will improve Outcome: Not Progressing   Problem: Clinical Measurements: Goal: Will remain free from infection Outcome: Not Progressing Goal: Cardiovascular complication will be avoided Outcome: Not Progressing   Problem: Activity: Goal: Risk for activity intolerance will decrease Outcome: Not Progressing   Problem: Nutrition: Goal: Adequate nutrition will be maintained Outcome: Not Progressing

## 2021-05-01 DIAGNOSIS — R410 Disorientation, unspecified: Secondary | ICD-10-CM | POA: Diagnosis not present

## 2021-05-01 DIAGNOSIS — I2690 Septic pulmonary embolism without acute cor pulmonale: Secondary | ICD-10-CM | POA: Diagnosis not present

## 2021-05-01 DIAGNOSIS — A4102 Sepsis due to Methicillin resistant Staphylococcus aureus: Secondary | ICD-10-CM | POA: Diagnosis not present

## 2021-05-01 DIAGNOSIS — R6520 Severe sepsis without septic shock: Secondary | ICD-10-CM | POA: Diagnosis not present

## 2021-05-01 LAB — BASIC METABOLIC PANEL
Anion gap: 8 (ref 5–15)
BUN: 18 mg/dL (ref 8–23)
CO2: 24 mmol/L (ref 22–32)
Calcium: 8.7 mg/dL — ABNORMAL LOW (ref 8.9–10.3)
Chloride: 108 mmol/L (ref 98–111)
Creatinine, Ser: 0.7 mg/dL (ref 0.61–1.24)
GFR, Estimated: >60 mL/min (ref >60)
Glucose, Bld: 110 mg/dL — ABNORMAL HIGH (ref 70–99)
Potassium: 3.1 mmol/L — ABNORMAL LOW (ref 3.5–5.1)
Sodium: 140 mmol/L (ref 135–145)

## 2021-05-01 LAB — CBC
HCT: 23.4 % — ABNORMAL LOW (ref 39.0–52.0)
Hemoglobin: 7.2 g/dL — ABNORMAL LOW (ref 13.0–17.0)
MCH: 32.4 pg (ref 26.0–34.0)
MCHC: 30.8 g/dL (ref 30.0–36.0)
MCV: 105.4 fL — ABNORMAL HIGH (ref 80.0–100.0)
Platelets: 440 10*3/uL — ABNORMAL HIGH (ref 150–400)
RBC: 2.22 MIL/uL — ABNORMAL LOW (ref 4.22–5.81)
RDW: 14.9 % (ref 11.5–15.5)
WBC: 6.7 10*3/uL (ref 4.0–10.5)
nRBC: 0 % (ref 0.0–0.2)

## 2021-05-01 LAB — GLUCOSE, CAPILLARY
Glucose-Capillary: 103 mg/dL — ABNORMAL HIGH (ref 70–99)
Glucose-Capillary: 104 mg/dL — ABNORMAL HIGH (ref 70–99)
Glucose-Capillary: 107 mg/dL — ABNORMAL HIGH (ref 70–99)
Glucose-Capillary: 109 mg/dL — ABNORMAL HIGH (ref 70–99)
Glucose-Capillary: 112 mg/dL — ABNORMAL HIGH (ref 70–99)
Glucose-Capillary: 93 mg/dL (ref 70–99)

## 2021-05-01 MED ORDER — POTASSIUM CHLORIDE 10 MEQ/100ML IV SOLN
10.0000 meq | INTRAVENOUS | Status: AC
  Administered 2021-05-01 (×4): 10 meq via INTRAVENOUS
  Filled 2021-05-01 (×4): qty 100

## 2021-05-01 MED ORDER — MAGNESIUM SULFATE 2 GM/50ML IV SOLN
2.0000 g | Freq: Once | INTRAVENOUS | Status: AC
  Administered 2021-05-01: 2 g via INTRAVENOUS
  Filled 2021-05-01: qty 50

## 2021-05-01 NOTE — Progress Notes (Signed)
[facility] PROGRESS NOTE  [name] VOZ:[id number] DOB: 1935/08/02 DOA: 04/15/2021 PCP: [name], DO  Status: Remains inpatient appropriate because:  Not medically stable to discharge due to open chest wound with exposed pericardium requiring inpatient wound VAC management; unsafe discharge plan  Barriers to discharge: Social: Wife does not wish to bring patient home with feeding tube in place that he has not managed appropriate oral intake and is currently requiring enteral feeds to maintain appropriate nutrition for wound healing  Clinical: Continued requirement for inpatient wound VAC management as described above; poor oral intake requiring supplemental enteral feeds via core track tube  Level of care:  Progressive   Code Status: DNR Family Communication: Patient only-only at bedside on 11/17 DVT prophylaxis:  COVID vaccination status:  Foley catheter: Yes POA: Yes -placed at [facility] 04/17/2021  HPI: 85 y.o. male with medical history significant of HTN; HLD; remote prostate CA; CAD s/p remote CABG; and abdominal wall melanoma.24 hours prior to presentation had been discharged from [facility] with recommendation for SNF placement but spouse refused and requested patient be discharged home with 24/7 care.  He presented to [facility] on 04/17/21 with sepsis, presenting with fever, encephalopathy, respiratory distress and A-fib RVR.  Transferred to [facility] on 04/28/2021 for CT surgery coverage due to a mediastinal fluid collection associated with transverse sternal fracture.  Patient found to have Staph bacteremia with findings of pulmonary septic emboli on CT scan.   Infectious disease, CT surgery, neurosurgery, palliative care consulted.  Patient's wife has not been willing to de-escalate care.  Cardiothoracic continues to follow for chest wound.  He remains on NG tube feedings through cortrak.  Poor overall prognosis.   Subjective: Slightly more restless  today.  Was able to communicate that he was having abdominal pain today.  Objective: Vitals:   05/01/21 0422 05/01/21 0817  BP: 106/86 133/86  Pulse: 99 93  Resp: 18 17  Temp: 98.2 F (36.8 C) 97.9 F (36.6 C)  SpO2: 98% 100%    Intake/Output Summary (Last 24 hours) at 05/01/2021 1142 Last data filed at 05/01/2021 4259 Gross per 24 hour  Intake 1081.12 ml  Output 1650 ml  Net -568.88 ml   Filed Weights   04/27/21 0331 04/30/21 0500 05/01/21 0422  Weight: 89.2 kg 94.8 kg 94.3 kg    Exam:   General: Appearance:     Chronically ill appearing male in no acute distress   +BS, abdomen distended  Lungs:     respirations unlabored  Heart:  normal     Neurologic:   will awaken to voice     Assessment/Plan: Acute problems: MRSA bacteremia with severe sepsis POA/septic emboli Sepsis POA with leukocytosis, tachycardia, tachypnea, hypoxia.  Evidence of acute organ failure with elevated lactate >2 that is not easily explained by another condition.  Sepsis physiology has resolved Suspected source is unknown - PNA, spinal osteo, recent skin surgery are considerations (see below) Infectious disease was consulted.  LD Vanco 6 weeks from last OR visit 11/10 TTE negative for endocarditis. TEE was considered but wife does not want him to have this procedure-  per CTS would not change management as patient needs long-term IV antibiotics.     Anterior mediastinal abscess/phlegmon w/ transverse sternal fx  Cardiothoracic surgery was consulted. Patient taken to OR for I&D on 11/8 and wound VAC placed. Repeat VAC change 11/10 Wound VAC changed at bedside continued exposure of pericardium/heart which excludes him from home health wound VAC changes  Abdominal distention Abdominal distention persists and appears to be worse than on 11/17.  Now having abdominal discomfort and bowel sounds are not auscultated 2-view portable abdominal films: Imaging consistent with ileus with retained stool   -large BM on 11/19   Subdural Hematoma (SDH) Patient has had recent SDH x 2, acute on chronic, seen at [facility].  This was secondary to multiple falls. Also with compression fractures Anticoagulation and ASA are contraindicated at this time.   New onset A-fib - POA, Likely triggered by infection.  Anticoagulation contraindicated in setting of SDH. Maintaining sinus rhythm/intermittent sinus tachycardia on Cardizem   Delirium/acute metabolic encephalopathy Most likely due to infection, unclear if recent traumatic SDH contributing.  Continue to hold sedating meds: Primidone, benadryl (consider d/c), Neurontin   Right midlung PNA vs Septic Emboli  Appears to have septic emboli on imaging. Treated with Vanc/Zosyn on admission. Zosyn d/c'd per ID Continue Vancomycin as recommended by ID   Hypokalemia  -replete aggressively  Maintenance IV fluid will contain potassium   Oropharyngeal dysphagia/severe protein calorie malnutrition Continue dysphagia 1 diet.  Oral intake is poor.   Plan is to continue with tube feedings for now via cortrack. Wife has stated that she does not want husband to come home with feeding tube in place Nutrition Problem: Severe Malnutrition Etiology: chronic illness (CAD) Signs/Symptoms: severe fat depletion, severe muscle depletion Interventions: Ensure Enlive (each supplement provides 350kcal and 20 grams of protein), MVI -BM today- large per documentation-- if bowels continue to move and bowel sounds stabilize, will resume tube feeds on Sunday   Thoracolumbar compression fractures with ? Osteomyelitis  No surgical intervention planned.  Seen by neurosurgery.  On antibiotics per ID.   Abdominal wall melanoma -Path reports from excision reflect negative margins.   Left AC superficial thrombophlebitis  Conservative management.  No DVT is noted on imaging studies.   Macrocytic anemia Drop in hemoglobin likely dilutional.  Hemoglobin is stable for the most part.   No overt bleeding noted.  -await today's labs  Anemia panel reviewed.  No clear-cut deficiencies identified.   TSH is noted to be 1.2 with a normal free T4.   Essential hypertension  Currently on Cardizem.  Blood pressure is stable.   Hyperlipidemia Resume statin at discharge.   Remote Prostate CA  Bladder wall thickening noted on imaging.  Monitor for now.  Outpatient follow-up with urology.  Has a Foley catheter which will remain in place at discharge.   Coronary artery disease s/p remote CABG   Goals of care Palliative care has been consulted since patient's prognosis appears to be poor.  Wife still wants aggressive medical care.   At this time patient is unsafe for discharge to either home or even to skilled nursing facility.      Data Reviewed: Basic Metabolic Panel: Recent Labs  Lab 05/07/2021 0125 04/28/21 0119 04/29/21 1105 04/30/21 0153 04/30/21 2104 05/01/21 0350  NA 138 138 142 145  --  140  K 3.7 3.4* 3.3* 3.5  --  3.1*  CL 107 106 109 112*  --  108  CO2 25 25 25 27   --  24  GLUCOSE 134* 117* 133* 128*  --  110*  BUN 18 20 24* 26*  --  18  CREATININE 0.58* 0.58* 0.73 0.72  --  0.70  CALCIUM 8.8* 8.7* 8.9 9.0  --  8.7*  MG  --   --   --   --  1.9  --    Liver Function  Tests: Recent Labs  Lab 04/28/21 0119 04/29/21 1105  AST 13* 15  ALT 13 14  ALKPHOS 77 81  BILITOT 0.3 0.7  PROT 6.0* 6.3*  ALBUMIN 1.9* 1.9*   No results for input(s): LIPASE, AMYLASE in the last 168 hours. Recent Labs  Lab 04/28/21 0119  AMMONIA 25   CBC: Recent Labs  Lab 04/25/21 0329 04/21/2021 0125 04/28/21 0119 04/29/21 1105  WBC 14.2* 15.8* 11.2* 9.8  NEUTROABS  --   --   --  8.3*  HGB 8.6* 8.5* 7.8* 7.3*  HCT 26.5* 26.2* 24.4* 23.8*  MCV 104.7* 105.6* 106.1* 107.2*  PLT 537* 523* 510* 481*    CBG: Recent Labs  Lab 04/30/21 1220 04/30/21 1548 04/30/21 2022 05/01/21 0420 05/01/21 0816  GLUCAP 101* 96 102* 93 112*    No results found for this or any  previous visit (from the past 240 hour(s)).    Scheduled Meds:  acetaminophen  1,000 mg Per Tube Q6H   azelastine  1 spray Each Nare BID   bisacodyl  10 mg Rectal BID   chlorhexidine  15 mL Mouth Rinse BID   Chlorhexidine Gluconate Cloth  6 each Topical Daily   diltiazem  60 mg Per Tube Q6H   docusate  100 mg Per Tube BID   feeding supplement (PROSource TF)  45 mL Per Tube BID   mouth rinse  15 mL Mouth Rinse q12n4p   polyethylene glycol  17 g Per Tube BID   sodium chloride flush  10-40 mL Intracatheter Q12H   sodium chloride flush  3 mL Intravenous Q12H   Continuous Infusions:  dextrose 5 % and 0.9 % NaCl with KCl 20 mEq/L 75 mL/hr at 05/01/21 0110   potassium chloride 10 mEq (05/01/21 1111)   vancomycin 1,000 mg (05/01/21 0957)    Principal Problem:   Sepsis due to methicillin resistant Staphylococcus aureus (MRSA) ([place]) Active Problems:   History of prostate cancer   Essential hypertension   CAD (coronary artery disease)   Hyperlipidemia, mixed   Lumbar compression fracture (HCC)   Atrial fibrillation with RVR (HCC)   Sternal wound infection   SDH (subdural hematoma)   Pneumonia involving right lung   Delirium   DNR (do not resuscitate)   Goals of care, counseling/discussion   Osteomyelitis of thoracic region ([place])   Protein-calorie malnutrition, severe   Sternal osteomyelitis ([place])   Acute septic pulmonary embolism ([place])   Abdominal distention   Consultants: Infectious disease Palliative medicine team Cardiothoracic surgery Neurosurgery  Procedures: Echocardiogram Core track tube placement Central line placement Sternal wound irrigation and debridement 50/2 with application of wound VAC Sternal washout with wound VAC change 11/10  Antibiotics: Vancomycin 11/5 >>> Zosyn 11/5 through 11/6   Time spent: 35 minutes    Geradine Girt DO  [facility]  13  days

## 2021-05-01 NOTE — Progress Notes (Addendum)
Pharmacy Antibiotic Note  [name] is a 85 y.o. male admitted on 05/02/2021 with MRSA bacteremia/sternal osteo and possible septic pulmonary emboli. He is s/p debridement of the sternum on 11/8 with repeat wound washout on 11/10. Scr is stable at <1. Previous levels collected showed patient at higher of goal and was reduced to prevent renal injury. OPAT orders have been entered.  Plan: Continue  vancomycin to 1000 mg every 24 hours - Estimated AUC 445 Monitor renal function  Height: 6' 0.01" (182.9 cm) Weight: 94.3 kg (208 lb) IBW/kg (Calculated) : 77.62  Temp (24hrs), Avg:98 F (36.7 C), Min:97.6 F (36.4 C), Max:98.5 F (36.9 C)  Recent Labs  Lab 04/25/21 0329 04/25/2021 0125 04/29/2021 1238 04/27/21 0800 04/28/21 0119 04/29/21 1105 04/30/21 0153 05/01/21 0350  WBC 14.2* 15.8*  --   --  11.2* 9.8  --   --   CREATININE 0.64 0.58*  --   --  0.58* 0.73 0.72 0.70  LATICACIDVEN  --   --   --   --   --  1.0  --   --   VANCOTROUGH  --   --   --  12*  --   --   --   --   VANCOPEAK  --   --  40  --   --   --   --   --      Estimated Creatinine Clearance: 83.4 mL/min (by C-G formula based on SCr of 0.7 mg/dL).    Allergies  Allergen Reactions   Codeine Nausea And Vomiting   Indocin [Indomethacin] Other (See Comments)   Latex Other (See Comments)    Blisters   Mupirocin     Blisters   Plavix [Clopidogrel Bisulfate]     GI intolerance, n/v   Polysporin [Bacitracin-Polymyxin B]     Blisters   Shellfish Allergy Nausea And Vomiting    Mussels   Tape    Neosporin [Neomycin-Bacitracin Zn-Polymyx] Rash   Other Nausea And Vomiting    mussels    Antimicrobials this admission: Zosyn 11/6 >> 11/7 Vancomcyin 11/6 >>   Dose adjustments this admission: Vancomycin 1250 mg every 24 hours >> vancomycin 1000 mg every 24 hours on 11/15  Microbiology results: 11/5 BCx: 4/4 staph aureus w/ MecA/C and MREJ resistance  11/5 UCx: mult sp  11/7 BCx: ngtd 11/8 Sternal tissue:  MRSA 11/8: Sternal wound fluid: MRSA  Thank you for allowing pharmacy to participate in this patient's care.  Reatha Harps, PharmD PGY1 Pharmacy Resident 05/01/2021 2:55 PM Check [email or web address] for unit specific pharmacy number

## 2021-05-01 NOTE — Progress Notes (Signed)
9 Days Post-Op Procedure(s) (LRB): STERNAL WOUND WASHOUT (N/A) WOUND VAC CHANGE (N/A) Subjective: When asked how he feels says "pretty good" but then no furtehr response  Objective: Vital signs in last 24 hours: Temp:  [97.6 F (36.4 C)-98.9 F (37.2 C)] 97.9 F (36.6 C) (11/19 0817) Pulse Rate:  [50-105] 93 (11/19 0817) Cardiac Rhythm: Normal sinus rhythm (11/19 0700) Resp:  [16-22] 17 (11/19 0817) BP: (106-161)/(61-102) 133/86 (11/19 0817) SpO2:  [98 %-100 %] 100 % (11/19 0817) Weight:  [94.3 kg] 94.3 kg (11/19 0422)  Hemodynamic parameters for last 24 hours:    Intake/Output from previous day: 11/18 0701 - 11/19 0700 In: 1081.1 [I.V.:1081.1] Out: 2600 [Urine:2600] Intake/Output this shift: Total I/O In: -  Out: 400 [Urine:400]  General appearance: distracted and no distress Wound VAC in place, skin around incision looks good  Lab Results: Recent Labs    04/29/21 1105  WBC 9.8  HGB 7.3*  HCT 23.8*  PLT 481*   BMET:  Recent Labs    04/30/21 0153 05/01/21 0350  NA 145 140  K 3.5 3.1*  CL 112* 108  CO2 27 24  GLUCOSE 128* 110*  BUN 26* 18  CREATININE 0.72 0.70  CALCIUM 9.0 8.7*    PT/INR: No results for input(s): LABPROT, INR in the last 72 hours. ABG    Component Value Date/Time   PHART 7.443 04/19/2021 1854   HCO3 19.9 (L) 04/19/2021 1854   ACIDBASEDEF 3.4 (H) 04/19/2021 1854   O2SAT 95.6 04/19/2021 1854   CBG (last 3)  Recent Labs    05/01/21 0420 05/01/21 0816 05/01/21 1216  GLUCAP 93 112* 109*    Assessment/Plan: S/P Procedure(s) (LRB): STERNAL WOUND WASHOUT (N/A) WOUND VAC CHANGE (N/A) Infected sternal fracture Not a candidate for flap closure Continue wound VAC dressing changes twice weekly   LOS: 13 days    [name] 05/01/2021

## 2021-05-01 NOTE — Plan of Care (Signed)
  Problem: Health Behavior/Discharge Planning: Goal: Ability to manage health-related needs will improve Outcome: Not Progressing   Problem: Clinical Measurements: Goal: Ability to maintain clinical measurements within normal limits will improve Outcome: Not Progressing Goal: Will remain free from infection Outcome: Not Progressing Goal: Diagnostic test results will improve Outcome: Not Progressing Goal: Respiratory complications will improve Outcome: Not Progressing Goal: Cardiovascular complication will be avoided Outcome: Not Progressing   

## 2021-05-02 DIAGNOSIS — A4102 Sepsis due to Methicillin resistant Staphylococcus aureus: Secondary | ICD-10-CM | POA: Diagnosis not present

## 2021-05-02 DIAGNOSIS — E43 Unspecified severe protein-calorie malnutrition: Secondary | ICD-10-CM | POA: Diagnosis not present

## 2021-05-02 DIAGNOSIS — Z66 Do not resuscitate: Secondary | ICD-10-CM | POA: Diagnosis not present

## 2021-05-02 DIAGNOSIS — I4891 Unspecified atrial fibrillation: Secondary | ICD-10-CM | POA: Diagnosis not present

## 2021-05-02 DIAGNOSIS — K5900 Constipation, unspecified: Secondary | ICD-10-CM

## 2021-05-02 LAB — CBC
HCT: 24 % — ABNORMAL LOW (ref 39.0–52.0)
Hemoglobin: 7.6 g/dL — ABNORMAL LOW (ref 13.0–17.0)
MCH: 33.5 pg (ref 26.0–34.0)
MCHC: 31.7 g/dL (ref 30.0–36.0)
MCV: 105.7 fL — ABNORMAL HIGH (ref 80.0–100.0)
Platelets: 440 10*3/uL — ABNORMAL HIGH (ref 150–400)
RBC: 2.27 MIL/uL — ABNORMAL LOW (ref 4.22–5.81)
RDW: 14.9 % (ref 11.5–15.5)
WBC: 10 10*3/uL (ref 4.0–10.5)
nRBC: 0 % (ref 0.0–0.2)

## 2021-05-02 LAB — GLUCOSE, CAPILLARY
Glucose-Capillary: 126 mg/dL — ABNORMAL HIGH (ref 70–99)
Glucose-Capillary: 128 mg/dL — ABNORMAL HIGH (ref 70–99)
Glucose-Capillary: 139 mg/dL — ABNORMAL HIGH (ref 70–99)
Glucose-Capillary: 141 mg/dL — ABNORMAL HIGH (ref 70–99)
Glucose-Capillary: 147 mg/dL — ABNORMAL HIGH (ref 70–99)
Glucose-Capillary: 153 mg/dL — ABNORMAL HIGH (ref 70–99)

## 2021-05-02 LAB — BASIC METABOLIC PANEL
Anion gap: 5 (ref 5–15)
BUN: 18 mg/dL (ref 8–23)
CO2: 23 mmol/L (ref 22–32)
Calcium: 8.6 mg/dL — ABNORMAL LOW (ref 8.9–10.3)
Chloride: 117 mmol/L — ABNORMAL HIGH (ref 98–111)
Creatinine, Ser: 0.82 mg/dL (ref 0.61–1.24)
GFR, Estimated: >60 mL/min (ref >60)
Glucose, Bld: 125 mg/dL — ABNORMAL HIGH (ref 70–99)
Potassium: 3.1 mmol/L — ABNORMAL LOW (ref 3.5–5.1)
Sodium: 145 mmol/L (ref 135–145)

## 2021-05-02 MED ORDER — METOPROLOL TARTRATE 5 MG/5ML IV SOLN
2.5000 mg | Freq: Four times a day (QID) | INTRAVENOUS | Status: DC | PRN
  Administered 2021-05-02: 2.5 mg via INTRAVENOUS
  Filled 2021-05-02: qty 5

## 2021-05-02 MED ORDER — PRIMIDONE 50 MG PO TABS
50.0000 mg | ORAL_TABLET | Freq: Three times a day (TID) | ORAL | Status: DC
  Administered 2021-05-02 – 2021-05-03 (×3): 50 mg
  Filled 2021-05-02 (×5): qty 1

## 2021-05-02 MED ORDER — POTASSIUM CHLORIDE 10 MEQ/100ML IV SOLN
10.0000 meq | INTRAVENOUS | Status: AC
  Administered 2021-05-02 (×6): 10 meq via INTRAVENOUS
  Filled 2021-05-02 (×6): qty 100

## 2021-05-02 MED ORDER — BISACODYL 10 MG RE SUPP: 10.0000 mg | Freq: Every day | RECTAL | Status: DC | PRN

## 2021-05-02 MED ORDER — MAGNESIUM SULFATE 2 GM/50ML IV SOLN
2.0000 g | Freq: Once | INTRAVENOUS | Status: AC
  Administered 2021-05-02: 2 g via INTRAVENOUS
  Filled 2021-05-02: qty 50

## 2021-05-02 MED ORDER — DOCUSATE SODIUM 50 MG/5ML PO LIQD
100.0000 mg | Freq: Two times a day (BID) | ORAL | Status: DC
Start: 2021-05-03 — End: 2021-05-03
  Filled 2021-05-02: qty 10

## 2021-05-02 MED ORDER — JEVITY 1.5 CAL/FIBER PO LIQD
1000.0000 mL | ORAL | Status: DC
  Administered 2021-05-02: 1000 mL
  Filled 2021-05-02 (×2): qty 1000

## 2021-05-02 MED ORDER — POLYETHYLENE GLYCOL 3350 17 G PO PACK: 17.0000 g | PACK | Freq: Every day | ORAL | Status: DC

## 2021-05-02 NOTE — Progress Notes (Signed)
      [address]       [place] [address]             [phone]      10 Days Post-Op Procedure(s) (LRB): STERNAL WOUND WASHOUT (N/A) WOUND VAC CHANGE (N/A)  Subjective:  Not conversive this morning.  Objective: Vital signs in last 24 hours: Temp:  [97.8 F (36.6 C)-98.9 F (37.2 C)] 98.1 F (36.7 C) (11/20 1000) Pulse Rate:  [71-111] 108 (11/20 1010) Cardiac Rhythm: Normal sinus rhythm (11/20 0802) Resp:  [14-24] 24 (11/20 1010) BP: (127-177)/(77-98) 177/84 (11/20 1010) SpO2:  [97 %-100 %] 97 % (11/20 1010) Weight:  [83 kg] 83 kg (11/20 0407)  Hemodynamic parameters for last 24 hours:    Intake/Output from previous day: 11/19 0701 - 11/20 0700 In: 1678.8 [I.V.:1152.7; IV Piggyback:526.1] Out: 1000 [Urine:1000] Intake/Output this shift: No intake/output data recorded.  General appearance: no distress Wound: wound vac in place  Lab Results: Recent Labs    05/01/21 0830 05/02/21 0557  WBC 6.7 10.0  HGB 7.2* 7.6*  HCT 23.4* 24.0*  PLT 440* 440*   BMET:  Recent Labs    05/01/21 0350 05/02/21 0557  NA 140 145  K 3.1* 3.1*  CL 108 117*  CO2 24 23  GLUCOSE 110* 125*  BUN 18 18  CREATININE 0.70 0.82  CALCIUM 8.7* 8.6*    PT/INR: No results for input(s): LABPROT, INR in the last 72 hours. ABG    Component Value Date/Time   PHART 7.443 04/19/2021 1854   HCO3 19.9 (L) 04/19/2021 1854   ACIDBASEDEF 3.4 (H) 04/19/2021 1854   O2SAT 95.6 04/19/2021 1854   CBG (last 3)  Recent Labs    05/01/21 2331 05/02/21 0412 05/02/21 0902  GLUCAP 103* 128* 139*    Assessment/Plan: S/P Procedure(s) (LRB): STERNAL WOUND WASHOUT (N/A) WOUND VAC CHANGE (N/A)  Infected sternal fracture, not a candidate for flap closure Wound vac change next week Palliative care to having family meeting Per primary   LOS: 14 days    Ellwood Handler, PA-C 05/02/2021

## 2021-05-02 NOTE — Progress Notes (Signed)
[facility] PROGRESS NOTE  [name] EHM:[id number] DOB: 1935/11/19 DOA: 05/08/2021 PCP: [name], DO  Status: Remains inpatient appropriate because:  Not medically stable to discharge due to open chest wound with exposed pericardium requiring inpatient wound VAC management; unsafe discharge plan  Barriers to discharge: Social: Wife does not wish to bring patient home with feeding tube in place that he has not managed appropriate oral intake and is currently requiring enteral feeds to maintain appropriate nutrition for wound healing  Clinical: Continued requirement for inpatient wound VAC management as described above; poor oral intake requiring supplemental enteral feeds via core track tube  Level of care:  Progressive   Code Status: DNR Family Communication: called wife- picked up phone but could not hear DVT prophylaxis: scd   Foley catheter: Yes POA: Yes -placed at [facility] 04/17/2021- could do voiding trial if he becomes mobile  HPI: 85 y.o. male with medical history significant of HTN; HLD; remote prostate CA; CAD s/p remote CABG; and abdominal wall melanoma.24 hours prior to presentation had been discharged from [facility] with recommendation for SNF placement but spouse refused and requested patient be discharged home with 24/7 care.  He presented to [facility] on 04/17/21 with sepsis, presenting with fever, encephalopathy, respiratory distress and A-fib RVR.  Transferred to [facility] on 04/24/2021 for CT surgery coverage due to a mediastinal fluid collection associated with transverse sternal fracture.  Patient found to have Staph bacteremia with findings of pulmonary septic emboli on CT scan.   Infectious disease, CT surgery, neurosurgery, palliative care consulted.  Patient's wife has not been willing to de-escalate care.  Cardiothoracic continues to follow for chest wound.  He remains on NG tube feedings through cortrak.  Poor overall prognosis.    Subjective: Continues to have BMs  Objective: Vitals:   05/02/21 0407 05/02/21 0800  BP: 137/86 (!) 159/90  Pulse: (!) 111 (!) 103  Resp: 20 20  Temp: 98.2 F (36.8 C)   SpO2: 99% 98%    Intake/Output Summary (Last 24 hours) at 05/02/2021 0805 Last data filed at 05/01/2021 2142 Gross per 24 hour  Intake 1678.75 ml  Output 1000 ml  Net 678.75 ml   Filed Weights   04/30/21 0500 05/01/21 0422 05/02/21 0407  Weight: 94.8 kg 94.3 kg 83 kg    Exam:    General: Appearance:    Ill appearing elderly, frail male resting in bed with mittens covering eyes   +BS, soft, less tender  Lungs:     respirations unlabored  Heart:   tachy     Neurologic:   Will awaken-- says he is trying to sleep, unable to have meaningful conversations    Assessment/Plan: Acute problems: MRSA bacteremia with severe sepsis POA/septic emboli Sepsis POA with leukocytosis, tachycardia, tachypnea, hypoxia.  Evidence of acute organ failure with elevated lactate >2 that is not easily explained by another condition.  Sepsis physiology has resolved Suspected source is unknown - PNA, spinal osteo, recent skin surgery are considerations (see below) Infectious disease was consulted.  LD Vanco 6 weeks from last OR visit 11/10 TTE negative for endocarditis. TEE was considered but wife does not want him to have this procedure-  per CTS would not change management as patient needs long-term IV antibiotics.     Anterior mediastinal abscess/phlegmon w/ transverse sternal fx  Cardiothoracic surgery was consulted. Patient taken to OR for I&D on 11/8 and wound VAC placed. Repeat VAC change 11/10 Wound VAC changed at bedside continued exposure of  pericardium/heart which excludes him from home health wound VAC changes  Abdominal distention Abdominal distention persists and appears to be worse than on 11/17.  Now having abdominal discomfort and bowel sounds are not auscultated 2-view portable abdominal films: Imaging  consistent with ileus with retained stool - CT scan w/o red flags -large BM on 11/19 and 11/20   Subdural Hematoma (SDH) Patient has had recent SDH x 2, acute on chronic, seen at [facility].  This was secondary to multiple falls. Also with compression fractures Anticoagulation and ASA are contraindicated at this time.   New onset A-fib - POA, Likely triggered by infection.  Anticoagulation contraindicated in setting of SDH. Maintaining sinus rhythm/intermittent sinus tachycardia on Cardizem   Delirium/acute metabolic encephalopathy Most likely due to infection, unclear if recent traumatic SDH contributing.  Continue to hold sedating meds: Primidone, benadryl (consider d/c), Neurontin   Right midlung PNA vs Septic Emboli  Appears to have septic emboli on imaging. Treated with Vanc/Zosyn on admission. Zosyn d/c'd per ID Continue Vancomycin as recommended by ID   Hypokalemia  -replete aggressively  -daily labs until K stable (needs K of 4 for bowel function)   Oropharyngeal dysphagia/severe protein calorie malnutrition Continue dysphagia 1 diet.  Oral intake is poor.   Plan is to continue with tube feedings for now via cortrack. Wife has stated that she does not want husband to come home with feeding tube in place Nutrition Problem: Severe Malnutrition Etiology: chronic illness (CAD) Signs/Symptoms: severe fat depletion, severe muscle depletion Interventions: Ensure Enlive (each supplement provides 350kcal and 20 grams of protein), MVI -BM today- large per documentation-- if bowels continue to move and bowel sounds stabilize, will resume tube feeds on Sunday   Thoracolumbar compression fractures with ? Osteomyelitis  No surgical intervention planned.  Seen by neurosurgery.  On antibiotics per ID.   Abdominal wall melanoma -Path reports from excision reflect negative margins.   Left AC superficial thrombophlebitis  Conservative management.  No DVT is noted on imaging studies.    Macrocytic anemia Drop in hemoglobin likely dilutional.  Hemoglobin is stable for the most part.  No overt bleeding noted.  -await today's labs  Anemia panel reviewed.  No clear-cut deficiencies identified.   TSH is noted to be 1.2 with a normal free T4.   Essential hypertension  Currently on Cardizem.  Blood pressure is stable.   Hyperlipidemia Resume statin at discharge.   Remote Prostate CA  Bladder wall thickening noted on imaging.  Monitor for now.  Outpatient follow-up with urology.  Has a Foley catheter which will remain in place at discharge.   Bruise on his left flank -unclear start/cause -Hgb stable- trend  Coronary artery disease s/p remote CABG   Goals of care Palliative care has been consulted since patient's prognosis appears to be poor.  Wife still wants aggressive medical care.   At this time patient is unsafe for discharge to either home or even to skilled nursing facility.      Data Reviewed: Basic Metabolic Panel: Recent Labs  Lab 04/28/21 0119 04/29/21 1105 04/30/21 0153 04/30/21 2104 05/01/21 0350 05/02/21 0557  NA 138 142 145  --  140 145  K 3.4* 3.3* 3.5  --  3.1* 3.1*  CL 106 109 112*  --  108 117*  CO2 25 25 27   --  24 23  GLUCOSE 117* 133* 128*  --  110* 125*  BUN 20 24* 26*  --  18 18  CREATININE 0.58* 0.73 0.72  --  0.70 0.82  CALCIUM 8.7* 8.9 9.0  --  8.7* 8.6*  MG  --   --   --  1.9  --   --    Liver Function Tests: Recent Labs  Lab 04/28/21 0119 04/29/21 1105  AST 13* 15  ALT 13 14  ALKPHOS 77 81  BILITOT 0.3 0.7  PROT 6.0* 6.3*  ALBUMIN 1.9* 1.9*   No results for input(s): LIPASE, AMYLASE in the last 168 hours. Recent Labs  Lab 04/28/21 0119  AMMONIA 25   CBC: Recent Labs  Lab 04/17/2021 0125 04/28/21 0119 04/29/21 1105 05/01/21 0830 05/02/21 0557  WBC 15.8* 11.2* 9.8 6.7 10.0  NEUTROABS  --   --  8.3*  --   --   HGB 8.5* 7.8* 7.3* 7.2* 7.6*  HCT 26.2* 24.4* 23.8* 23.4* 24.0*  MCV 105.6* 106.1* 107.2*  105.4* 105.7*  PLT 523* 510* 481* 440* 440*    CBG: Recent Labs  Lab 05/01/21 1216 05/01/21 1624 05/01/21 2138 05/01/21 2331 05/02/21 0412  GLUCAP 109* 104* 107* 103* 128*    No results found for this or any previous visit (from the past 240 hour(s)).    Scheduled Meds:  acetaminophen  1,000 mg Per Tube Q6H   azelastine  1 spray Each Nare BID   chlorhexidine  15 mL Mouth Rinse BID   Chlorhexidine Gluconate Cloth  6 each Topical Daily   diltiazem  60 mg Per Tube Q6H   [START ON 05/03/2021] docusate  100 mg Per Tube BID   feeding supplement (PROSource TF)  45 mL Per Tube BID   mouth rinse  15 mL Mouth Rinse q12n4p   [START ON 05/03/2021] polyethylene glycol  17 g Per Tube Daily   sodium chloride flush  10-40 mL Intracatheter Q12H   sodium chloride flush  3 mL Intravenous Q12H   Continuous Infusions:  feeding supplement (JEVITY 1.5 CAL/FIBER)     magnesium sulfate bolus IVPB     potassium chloride     vancomycin 1,000 mg (05/01/21 0957)    Principal Problem:   Sepsis due to methicillin resistant Staphylococcus aureus (MRSA) ([place]) Active Problems:   History of prostate cancer   Essential hypertension   CAD (coronary artery disease)   Hyperlipidemia, mixed   Lumbar compression fracture (HCC)   Atrial fibrillation with RVR (HCC)   Sternal wound infection   SDH (subdural hematoma)   Pneumonia involving right lung   Delirium   DNR (do not resuscitate)   Goals of care, counseling/discussion   Osteomyelitis of thoracic region ([place])   Protein-calorie malnutrition, severe   Sternal osteomyelitis ([place])   Acute septic pulmonary embolism ([place])   Abdominal distention   Consultants: Infectious disease Palliative medicine team Cardiothoracic surgery Neurosurgery  Procedures: Echocardiogram Core track tube placement Central line placement Sternal wound irrigation and debridement 21/1 with application of wound VAC Sternal washout with wound VAC change  11/10  Antibiotics: Vancomycin 11/5 >>> Zosyn 11/5 through 11/6   Time spent: 35 minutes    Geradine Girt DO  [facility]  14  days

## 2021-05-03 ENCOUNTER — Inpatient Hospital Stay (HOSPITAL_COMMUNITY): Payer: Medicare Other

## 2021-05-03 DIAGNOSIS — Z7189 Other specified counseling: Secondary | ICD-10-CM | POA: Diagnosis not present

## 2021-05-03 LAB — GLUCOSE, CAPILLARY
Glucose-Capillary: 147 mg/dL — ABNORMAL HIGH (ref 70–99)
Glucose-Capillary: 138 mg/dL — ABNORMAL HIGH (ref 70–99)

## 2021-05-03 LAB — BASIC METABOLIC PANEL
Anion gap: 7 (ref 5–15)
BUN: 33 mg/dL — ABNORMAL HIGH (ref 8–23)
CO2: 19 mmol/L — ABNORMAL LOW (ref 22–32)
Calcium: 8.5 mg/dL — ABNORMAL LOW (ref 8.9–10.3)
Chloride: 118 mmol/L — ABNORMAL HIGH (ref 98–111)
Creatinine, Ser: 1.34 mg/dL — ABNORMAL HIGH (ref 0.61–1.24)
GFR, Estimated: 53 mL/min — ABNORMAL LOW (ref >60)
Glucose, Bld: 143 mg/dL — ABNORMAL HIGH (ref 70–99)
Potassium: 3.9 mmol/L (ref 3.5–5.1)
Sodium: 144 mmol/L (ref 135–145)

## 2021-05-03 LAB — CBC
HCT: 23.2 % — ABNORMAL LOW (ref 39.0–52.0)
Hemoglobin: 7.2 g/dL — ABNORMAL LOW (ref 13.0–17.0)
MCH: 33 pg (ref 26.0–34.0)
MCHC: 31 g/dL (ref 30.0–36.0)
MCV: 106.4 fL — ABNORMAL HIGH (ref 80.0–100.0)
Platelets: 445 10*3/uL — ABNORMAL HIGH (ref 150–400)
RBC: 2.18 MIL/uL — ABNORMAL LOW (ref 4.22–5.81)
RDW: 15.2 % (ref 11.5–15.5)
WBC: 10.3 10*3/uL (ref 4.0–10.5)
nRBC: 0 % (ref 0.0–0.2)

## 2021-05-03 LAB — MAGNESIUM: Magnesium: 2.5 mg/dL — ABNORMAL HIGH (ref 1.7–2.4)

## 2021-05-03 MED ORDER — ACETAMINOPHEN 325 MG PO TABS: 650.0000 mg | ORAL_TABLET | Freq: Four times a day (QID) | ORAL | Status: DC | PRN

## 2021-05-03 MED ORDER — SODIUM CHLORIDE 0.9 % IV SOLN
1.0000 mg/h | INTRAVENOUS | Status: DC
  Administered 2021-05-03: 0.5 mg/h via INTRAVENOUS
  Filled 2021-05-03 (×3): qty 5

## 2021-05-03 MED ORDER — DIPHENHYDRAMINE HCL 50 MG/ML IJ SOLN: 12.5000 mg | INTRAMUSCULAR | Status: DC | PRN

## 2021-05-03 MED ORDER — HALOPERIDOL 0.5 MG PO TABS
0.5000 mg | ORAL_TABLET | ORAL | Status: DC | PRN
  Filled 2021-05-03: qty 1

## 2021-05-03 MED ORDER — POLYETHYLENE GLYCOL 3350 17 GM/SCOOP PO POWD
1.0000 | Freq: Once | ORAL | Status: DC
  Filled 2021-05-03: qty 255

## 2021-05-03 MED ORDER — KCL IN DEXTROSE-NACL 40-5-0.9 MEQ/L-%-% IV SOLN
INTRAVENOUS | Status: DC
  Filled 2021-05-03 (×4): qty 1000

## 2021-05-03 MED ORDER — HALOPERIDOL LACTATE 2 MG/ML PO CONC
0.5000 mg | ORAL | Status: DC | PRN
  Filled 2021-05-03: qty 0.3

## 2021-05-03 MED ORDER — GLYCOPYRROLATE 1 MG PO TABS
1.0000 mg | ORAL_TABLET | ORAL | Status: DC | PRN
  Filled 2021-05-03: qty 1

## 2021-05-03 MED ORDER — GLYCOPYRROLATE 0.2 MG/ML IJ SOLN
0.2000 mg | INTRAMUSCULAR | Status: DC | PRN
  Filled 2021-05-03: qty 1

## 2021-05-03 MED ORDER — FREE WATER: 200.0000 mL | Freq: Four times a day (QID) | Status: DC

## 2021-05-03 MED ORDER — ONDANSETRON HCL 4 MG/2ML IJ SOLN: 4.0000 mg | Freq: Four times a day (QID) | INTRAMUSCULAR | Status: DC | PRN

## 2021-05-03 MED ORDER — BIOTENE DRY MOUTH MT LIQD: 15.0000 mL | OROMUCOSAL | Status: DC | PRN

## 2021-05-03 MED ORDER — HYDROMORPHONE HCL 1 MG/ML IJ SOLN: 0.5000 mg | INTRAMUSCULAR | Status: DC | PRN

## 2021-05-03 MED ORDER — ONDANSETRON 4 MG PO TBDP: 4.0000 mg | ORAL_TABLET | Freq: Four times a day (QID) | ORAL | Status: DC | PRN

## 2021-05-03 MED ORDER — GLYCOPYRROLATE 0.2 MG/ML IJ SOLN
0.2000 mg | INTRAMUSCULAR | Status: DC | PRN
  Administered 2021-05-05 (×2): 0.2 mg via INTRAVENOUS
  Filled 2021-05-03: qty 1

## 2021-05-03 MED ORDER — ACETAMINOPHEN 650 MG RE SUPP: 650.0000 mg | Freq: Four times a day (QID) | RECTAL | Status: DC | PRN

## 2021-05-03 MED ORDER — POLYVINYL ALCOHOL 1.4 % OP SOLN
1.0000 [drp] | Freq: Four times a day (QID) | OPHTHALMIC | Status: DC | PRN
  Filled 2021-05-03: qty 15

## 2021-05-03 MED ORDER — HALOPERIDOL LACTATE 5 MG/ML IJ SOLN: 0.5000 mg | INTRAMUSCULAR | Status: DC | PRN

## 2021-05-03 MED ORDER — JEVITY 1.5 CAL/FIBER PO LIQD
1000.0000 mL | ORAL | Status: DC
Start: 2021-05-03 — End: 2021-05-03
  Administered 2021-05-03: 1000 mL
  Filled 2021-05-03: qty 1000

## 2021-05-03 NOTE — Progress Notes (Signed)
SLP Cancellation Note  Patient Details Name: [name] MRN: [id number] DOB: Jul 10, 1935   Cancelled treatment:       Reason Eval/Treat Not Completed: Other (comment) Touched base with RN this morning who says that pt is not alert enough to attempt PO intake today. She also shared that family appears to be transitioning goals to focus on comfort only. Orders are now discontinued, suspect to focus on comfort care. Please reorder if any further SLP services are needed     Osie Bond., M.A. [place] [facility] Pager [phone] Office [phone]  05/03/2021, 11:50 AM

## 2021-05-03 NOTE — Progress Notes (Addendum)
[facility] PROGRESS NOTE  [name] TIW:[id number] DOB: [birth year] DOA: 04/19/2021 PCP: [name], DO  Comfort measures  Status: Remains inpatient appropriate because:  Complete end-of-life care  Barriers to discharge: Social: Wife request patient remain in the hospital for end-of-life care  Clinical: Transitioned to comfort measures as of 11/21  Level of care:  Progressive   Code Status: DNR Family Communication: Extensive discussion held at bedside with patient's wife.  Updated on significant lack of improvement and expectation that patient will not recover from current illness.  With this information wife requested that we keep patient comfortable and she wanted to continue end-of-life care here at [facility] as opposed to residential hospice.  Unable to manage his care at home.  She also requested that medical staff contact her once daily at 10 AM with updates. DVT prophylaxis:  COVID vaccination status:  Foley catheter: Yes POA: Yes -[facility]  HPI: 85 y.o. male with medical history significant of HTN; HLD; remote prostate CA; CAD s/p remote CABG; and abdominal wall melanoma.24 hours prior to presentation had been discharged from [facility] with recommendation for SNF placement but spouse refused and requested patient be discharged home with 24/7 care.  He presented to [facility] on 04/17/21 with sepsis, presenting with fever, encephalopathy, respiratory distress and A-fib RVR.  Transferred to [facility] on 04/23/2021 for CT surgery coverage due to a mediastinal fluid collection associated with transverse sternal fracture.  Patient found to have Staph bacteremia with findings of pulmonary septic emboli on CT scan.   Infectious disease, CT surgery, neurosurgery, palliative care consulted.  Patient's wife has not been willing to de-escalate care.  Cardiothoracic continues to follow for chest wound.  He remains on NG tube feedings  through cortrak.  Poor overall prognosis.   Subjective: Does not open eyes to voice or tactile stimulation but does moan and grimace with palpation of abdomen  Objective: Vitals:   05/02/21 2325 05/03/21 0445  BP: (!) 141/107 (!) 127/110  Pulse: (!) 103 96  Resp: 20 20  Temp: 99.5 F (37.5 C) 99.2 F (37.3 C)  SpO2: 100% 98%    Intake/Output Summary (Last 24 hours) at 05/03/2021 0746 Last data filed at 05/02/2021 2118 Gross per 24 hour  Intake 918.62 ml  Output 0 ml  Net 918.62 ml   Filed Weights   04/30/21 0500 05/01/21 0422 05/02/21 0407  Weight: 94.8 kg 94.3 kg 83 kg    Exam:  Constitutional: Lethargic and minimally responsive Respiratory: clear to auscultation bilaterally but decreased in bases in context of significantly distended abdomen, No accessory muscle use.  Room air, O2 sats between 93 and 98% Cardiovascular: Regular rate and rhythm, S1-S2, no extremity edema.  Extremities mottled.  Pedal pulses weak.   Abdomen: Markedly distended and tympanitic with hypoactive bowel sounds -appears to be tender upon palpation noting patient grimacing after palpation Neurologic: CN 2-12 grossly intact. Sensation intact, patient unable to follow simple commands so unable to accurately assess strength Psychiatric: Lethargic, require safety mittens while core track tube in place  Assessment/Plan: Acute problems: MRSA bacteremia with severe sepsis POA/septic emboli Sepsis physiology has resolved-source never determined-plan was to continue IV vancomycin for 6 weeks post last OR date on 11/10 but as of 11/21 patient has transition to comfort measures and IV antibiotics discontinued  Goals of care Wife at bedside today on 11/21.  Discussed that patient has not improved and has only worsened and the general consistence from all providers  is that this patient will not recover from current illness.  This information was presented to the wife who agreed and wanted her husband to be kept  comfortable but also wanted him to remain at [facility] to complete end-of-life care All unnecessary medications and treatments have been discontinued End-of-life order set initiated Low-dose Dilaudid infusion initiated with plans to uptitrate as needed for comfort   Anterior mediastinal abscess/phlegmon w/ transverse sternal fx  Comfort measures so VAC discontinued in favor of saline packing prn  Abdominal distention Work-up unremarkable except for retained stool Now comfort measures so core track tube discontinued as well as tube feedings and on nutritional supplements and unnecessary medication   Subdural Hematoma (SDH) Patient has had recent SDH x 2, acute on chronic, seen at [facility].  This was secondary to multiple falls.    New onset A-fib - POA, Maintaining sinus rhythm/intermittent sinus tachycardia   Delirium/acute metabolic encephalopathy Comfort measures in place with appropriate prn sedative medication   Right midlung PNA vs Septic Emboli  Imaging at admission appeared consistent with septic emboli Focus now is on comfort   Oropharyngeal dysphagia/severe protein calorie malnutrition Has been unable to maintain appropriate oral intake of nutrition.  Status overall worsened and now has transition to end-of-life care therefore feeding tube discontinued as have been tube feedings and nutritional supplements   Thoracolumbar compression fractures with ? Osteomyelitis  No surgical intervention planned.  Seen by neurosurgery.    Abdominal wall melanoma -Path reports from excision reflect negative margins.   Left AC superficial thrombophlebitis  Conservative management.  No DVT is noted on imaging studies.   Macrocytic anemia No further labs will be obtained   Essential hypertension  Currently on Cardizem.     Hyperlipidemia   Remote Prostate CA  Bladder wall thickening noted on imaging.  Monitor for now.  Outpatient follow-up with urology.  Has a Foley catheter which  will remain in place for comfort  Coronary artery disease s/p remote CABG     Data Reviewed: Basic Metabolic Panel: Recent Labs  Lab 04/29/21 1105 04/30/21 0153 04/30/21 2104 05/01/21 0350 05/02/21 0557 05/03/21 0450  NA 142 145  --  140 145 144  K 3.3* 3.5  --  3.1* 3.1* 3.9  CL 109 112*  --  108 117* 118*  CO2 25 27  --  24 23 19*  GLUCOSE 133* 128*  --  110* 125* 143*  BUN 24* 26*  --  18 18 33*  CREATININE 0.73 0.72  --  0.70 0.82 1.34*  CALCIUM 8.9 9.0  --  8.7* 8.6* 8.5*  MG  --   --  1.9  --   --  2.5*   Liver Function Tests: Recent Labs  Lab 04/28/21 0119 04/29/21 1105  AST 13* 15  ALT 13 14  ALKPHOS 77 81  BILITOT 0.3 0.7  PROT 6.0* 6.3*  ALBUMIN 1.9* 1.9*   No results for input(s): LIPASE, AMYLASE in the last 168 hours. Recent Labs  Lab 04/28/21 0119  AMMONIA 25   CBC: Recent Labs  Lab 04/28/21 0119 04/29/21 1105 05/01/21 0830 05/02/21 0557 05/03/21 0450  WBC 11.2* 9.8 6.7 10.0 10.3  NEUTROABS  --  8.3*  --   --   --   HGB 7.8* 7.3* 7.2* 7.6* 7.2*  HCT 24.4* 23.8* 23.4* 24.0* 23.2*  MCV 106.1* 107.2* 105.4* 105.7* 106.4*  PLT 510* 481* 440* 440* 445*    CBG: Recent Labs  Lab 05/02/21 1256 05/02/21 1715  05/02/21 2018 05/02/21 2330 05/03/21 0449  GLUCAP 153* 147* 141* 126* 147*    No results found for this or any previous visit (from the past 240 hour(s)).    Scheduled Meds:  acetaminophen  1,000 mg Per Tube Q6H   azelastine  1 spray Each Nare BID   chlorhexidine  15 mL Mouth Rinse BID   Chlorhexidine Gluconate Cloth  6 each Topical Daily   diltiazem  60 mg Per Tube Q6H   docusate  100 mg Per Tube BID   feeding supplement (PROSource TF)  45 mL Per Tube BID   free water  200 mL Per Tube Q6H   mouth rinse  15 mL Mouth Rinse q12n4p   polyethylene glycol  17 g Per Tube Daily   primidone  50 mg Per Tube Q8H   sodium chloride flush  10-40 mL Intracatheter Q12H   Continuous Infusions:  feeding supplement (JEVITY 1.5  CAL/FIBER) 1,000 mL (05/02/21 1012)   vancomycin 1,000 mg (05/02/21 0949)    Principal Problem:   Sepsis due to methicillin resistant Staphylococcus aureus (MRSA) ([place]) Active Problems:   History of prostate cancer   Essential hypertension   CAD (coronary artery disease)   Hyperlipidemia, mixed   Lumbar compression fracture (HCC)   Atrial fibrillation with RVR (HCC)   Sternal wound infection   SDH (subdural hematoma)   Pneumonia involving right lung   Delirium   DNR (do not resuscitate)   Goals of care, counseling/discussion   Osteomyelitis of thoracic region ([place])   Protein-calorie malnutrition, severe   Sternal osteomyelitis ([place])   Acute septic pulmonary embolism ([place])   Abdominal distention   Consultants: Infectious disease Palliative medicine team Cardiothoracic surgery Neurosurgery  Procedures: Echocardiogram Core track tube placement Central line placement Sternal wound irrigation and debridement 59/7 with application of wound VAC Sternal washout with wound VAC change 11/10  Antibiotics: Vancomycin 11/5 >>> 11/21 Zosyn 11/5 through 11/6   Time spent: 35 minutes    Erin Hearing ANP  [facility] 7 am - 330 pm/M-F for direct patient care and secure chat Please refer to Amion for contact info 15  days

## 2021-05-03 NOTE — Consult Note (Signed)
WOC contacted by the NP; patient transitioned to comfort care. She DC NPWT dressing this am and converted to saline moist packing changed PRN.    Re consult if needed, will not follow at this time. Thanks  Clorinda Wyble [employer], RN,CWOCN, CNS, [place] [phone])

## 2021-05-03 NOTE — Progress Notes (Addendum)
Patient's abdomen is more distended and tender today.  Will need to continue aggressive bowel regimen which most likely will lead to loose stools so patient may need rectal pouch.  K has been replaced.  Unfortunately, patient continues to not improve/progress- continue palliative care conversations with family.  Please see note from Erin Hearing for full evaluation. Eulogio Bear DO  Addendum: patient has been appropriately changed to comfort care.  Anticipate in hospital death

## 2021-05-03 NOTE — Progress Notes (Signed)
Nutrition Brief Note  Chart reviewed. Patient now transitioning to comfort care.  Cortrak has been removed and TF discontinued.  No further nutrition interventions planned at this time.  Please re-consult as needed.   Lucas Mallow, RD, LDN, CNSC Please refer to [facility]) for contact information.

## 2021-05-03 NOTE — TOC Progression Note (Signed)
Transition of Care [facility]) - Progression Note    Patient Details  Name: [name] MRN: [id number] Date of Birth: July 11, 1935  Transition of Care [facility]) CM/SW [place], RN Phone Number: 05/03/2021, 11:40 AM  Clinical Narrative:    Case management met with the patient and wife at the bedside along with Ander Purpura, RN, Dr. Hilma Favors, and Erin Hearing, NP to discuss goals of care with the patient's wife at the bedside.  The patient's wife states that she realizes that "my husband is not doing well and is not waking up for me any longer".  Erin Hearing, NP spoke with the patient about his current medical condition and the wife states that she is in agreement for full comfort care in the hospital and declines hospice care support including grief counseling.  The patient's wife states that "I realize that he is not able to come home" and asks that the patient receive comfort care orders in the hospital and declines other services.  The patient's wife states that she does not need grief counseling through Hospice care and states that she has a brother and plenty of friends to "help her through this time".  The patient's wife states that she does not plan to come back to the hospital to visit at anytime past today.   She states that she has a cell phone and can be reached in the morning for medical updates after 10 am every morning.  The patient's wife's caregiver remained with her during this discussion and transported her home by car.  CM and MSW with DTP Team will continue to follow the patient for [facility] needs - the patient's wife requests comfort care orders and requests patient remain in the hospital for inpatient as hospice patient and declined transfer to home or inpatient hospice facility.   Expected Discharge Plan: [place] Barriers to Discharge: Continued Medical Work up  Expected Discharge Plan and Services Expected Discharge Plan: [place] In-house Referral: Clinical Social Work Discharge Planning Services: CM Consult Post Acute Care Choice: Home Health, Resumption of Svcs/PTA Provider (Currently active with [facility]) Living arrangements for the past 2 months: [place]: RN, Disease Management, OT, PT, Social Work [employer] Agency: [place] ([place]) Date [place]: 04/24/21 Time [place]: 1518 Representative spoke with at [place]: [place] ([place]) Interventions    Readmission Risk Interventions No flowsheet data found.

## 2021-05-03 NOTE — Progress Notes (Signed)
PT Cancellation Note  Patient Details Name: Nefi Musich MRN: [id number] DOB: 10-04-1935   Cancelled Treatment:    Reason Eval/Treat Not Completed: Patient at procedure or test/unavailable, will follow-up for PT treatment as schedule permits. Of note, discussion with Dr. Eliseo Squires regarding need to keep patient on caseload due to lack of mobility progression and pending discussions regarding comfort care; MD reports need to stay on caseload due to low likelihood of comfort measures, but okay with updating frequency to 1x/wk. Will continue to follow acutely.  Mabeline Caras, PT, DPT Acute Rehabilitation Services  Pager [phone] Office [employer] 05/03/2021, 9:28 AM

## 2021-05-03 NOTE — Consult Note (Signed)
[place] Nurse wound follow up Wound type:surgical Measurement:9cm x 5.5cm x 3.0cm  Wound NOM:VEHM, palpable bone present, tissue in base that expands with ventilation Drainage (amount, consistency, odor) serous, minimal  Periwound:intact, skin prep applied  Dressing procedure/placement/frequency: Injected saline into the dressing approximately 10 mins prior to dressing change with suction off to loosen dressings  Removed old NPWT dressing; 4 pieces of white foam and 1 piece of black foam removed  Filled wound with  _1__ piece of black foam, __4__ piece of white foam (2 pc used under the undermined areas at 10-11 o'clock  Sealed NPWT dressing at 172mHG  Patient received IV pain medication per bedside nurse prior to dressing change Patient tolerated procedure well; he is unresponsive at baseline. Mittens in place for safety, he is not moving or grabbing at anything today  WPaisleynurse will continue to provide NPWT dressing changed due to the complexity of the dressing change.  2 dressing kits ordered and requested to the patient's room. New sterile suture removal kit placed in the patient's room  [place]

## 2021-05-04 DIAGNOSIS — Z66 Do not resuscitate: Secondary | ICD-10-CM | POA: Diagnosis not present

## 2021-05-04 DIAGNOSIS — Z515 Encounter for palliative care: Secondary | ICD-10-CM | POA: Diagnosis not present

## 2021-05-04 LAB — URINALYSIS, COMPLETE (UACMP) WITH MICROSCOPIC
Bilirubin Urine: NEGATIVE
Glucose, UA: NEGATIVE mg/dL
Ketones, ur: NEGATIVE mg/dL
Nitrite: NEGATIVE
Protein, ur: 100 mg/dL — AB
RBC / HPF: >50 RBC/hpf — ABNORMAL HIGH (ref 0–5)
Specific Gravity, Urine: 1.018 (ref 1.005–1.030)
WBC, UA: >50 WBC/hpf — ABNORMAL HIGH (ref 0–5)
pH: 8 (ref 5.0–8.0)

## 2021-05-04 NOTE — Progress Notes (Signed)
[facility] PROGRESS NOTE  [name] [name]:[id number] DOB: [birth year] DOA: 04/25/2021 PCP: [name], DO  Comfort measures  Status: Remains inpatient appropriate because:  Complete end-of-life care  Barriers to discharge: Social: Wife request patient remain in the hospital for end-of-life care  Clinical: Transitioned to comfort measures as of 11/21  Level of care:  Palliative Care   Code Status: DNR Family Communication: Extensive discussion held at bedside with patient's wife.  Updated on significant lack of improvement and expectation that patient will not recover from current illness.  With this information wife requested that we keep patient comfortable and she wanted to continue end-of-life care here at [facility] as opposed to residential hospice.  Unable to manage his care at home.  She also requested that medical staff contact her once daily at 10 AM with updates. DVT prophylaxis:  COVID vaccination status:  Foley catheter: Yes POA: Yes -placed at [facility] 04/17/2021  HPI: 85 y.o. male with medical history significant of HTN; HLD; remote prostate CA; CAD s/p remote CABG; and abdominal wall melanoma.24 hours prior to presentation had been discharged from [facility] with recommendation for SNF placement but spouse refused and requested patient be discharged home with 24/7 care.  He presented to [facility] on 04/17/21 with sepsis, presenting with fever, encephalopathy, respiratory distress and A-fib RVR.  Transferred to [facility] on 04/17/2021 for CT surgery coverage due to a mediastinal fluid collection associated with transverse sternal fracture.  Patient found to have Staph bacteremia with findings of pulmonary septic emboli on CT scan.   Infectious disease, CT surgery, neurosurgery, palliative care consulted.  Patient's wife has not been willing to de-escalate care.  Cardiothoracic continues to follow for chest wound.  He remains on NG tube feedings  through cortrak.  Poor overall prognosis.   Subjective: Unresponsive and appears comfortable on Dilaudid infusion.  Was restless overnight secondary to obstructed indwelling urinary catheter which was subsequently changed out.  Now with adequate urinary output.  Objective: Vitals:   05/03/21 1142 05/03/21 1938  BP: (!) 135/96 (!) 124/105  Pulse: 96 (!) 109  Resp: (!) 24 (!) 22  Temp: 98 F (36.7 C) (!) 97.3 F (36.3 C)  SpO2: 99% 100%    Intake/Output Summary (Last 24 hours) at 05/04/2021 0744 Last data filed at 05/04/2021 0418 Gross per 24 hour  Intake 1536.31 ml  Output 1650 ml  Net -113.69 ml   Filed Weights   04/30/21 0500 05/01/21 0422 05/02/21 0407  Weight: 94.8 kg 94.3 kg 83 kg    Exam:  Constitutional: Sedated and appears comfortable Respiratory: Anterior lung sounds clear to auscultation, primarily breathing orally, [place] Cardiovascular: Regular tachycardic rate and rhythm, S1-S2, no extremity edema.  Extremities mottled.    Abdomen: Less distended, soft, no bowel sounds auscultated.  Tube feeding now off Neurologic: Sedated Psychiatric: Sedated  Assessment/Plan: Acute problems: MRSA bacteremia with severe sepsis POA/septic emboli Sepsis physiology resolved-source never determined-plan was to continue IV vancomycin for 6 weeks post last OR date on 11/10 but as of 11/21 patient has transitioned to comfort measures and IV antibiotics discontinued  Goals of care Wife at bedside on 11/21.  Discussed that patient has not improved and has only worsened and the general consistence from all providers is that this patient will not recover from current illness.  This information was presented to the wife who agreed and wanted her husband to be kept comfortable but also wanted him to remain at [facility] to complete end-of-life  care All unnecessary medications and treatments have been discontinued End-of-life order set initiated Low-dose Dilaudid infusion initiated with  plans to uptitrate as needed for comfort   Anterior mediastinal abscess/phlegmon w/ transverse sternal fx  Continue normal saline packing prn   Subdural Hematoma (SDH) Patient has had recent SDH x 2, acute on chronic, seen at [facility].  This was secondary to multiple falls.    New onset A-fib - POA, Maintaining sinus rhythm/intermittent sinus tachycardia   Delirium/acute metabolic encephalopathy Comfort measures in place with appropriate prn sedative medication   Right midlung PNA vs Septic Emboli  Imaging at admission appeared consistent with septic emboli Focus now is on comfort   Oropharyngeal dysphagia/severe protein calorie malnutrition Has been unable to maintain appropriate oral intake of nutrition.  Status overall worsened and now has transitioned to end-of-life care therefore feeding tube discontinued as have been tube feedings and nutritional supplements   Thoracolumbar compression fractures with ? Osteomyelitis  Continue Dilaudid infusion as above   Abdominal wall melanoma  Left AC superficial thrombophlebitis  Conservative management.  No DVT is noted on imaging studies.   Macrocytic anemia No further labs will be obtained   Essential hypertension  Currently on Cardizem.     Hyperlipidemia   Remote Prostate CA  Has a Foley catheter which will remain in place for comfort  Coronary artery disease s/p remote CABG     Data Reviewed: Basic Metabolic Panel: Recent Labs  Lab 04/29/21 1105 04/30/21 0153 04/30/21 2104 05/01/21 0350 05/02/21 0557 05/03/21 0450  NA 142 145  --  140 145 144  K 3.3* 3.5  --  3.1* 3.1* 3.9  CL 109 112*  --  108 117* 118*  CO2 25 27  --  24 23 19*  GLUCOSE 133* 128*  --  110* 125* 143*  BUN 24* 26*  --  18 18 33*  CREATININE 0.73 0.72  --  0.70 0.82 1.34*  CALCIUM 8.9 9.0  --  8.7* 8.6* 8.5*  MG  --   --  1.9  --   --  2.5*   Liver Function Tests: Recent Labs  Lab 04/28/21 0119 04/29/21 1105  AST 13* 15  ALT 13 14   ALKPHOS 77 81  BILITOT 0.3 0.7  PROT 6.0* 6.3*  ALBUMIN 1.9* 1.9*    Recent Labs  Lab 04/28/21 0119  AMMONIA 25   CBC: Recent Labs  Lab 04/28/21 0119 04/29/21 1105 05/01/21 0830 05/02/21 0557 05/03/21 0450  WBC 11.2* 9.8 6.7 10.0 10.3  NEUTROABS  --  8.3*  --   --   --   HGB 7.8* 7.3* 7.2* 7.6* 7.2*  HCT 24.4* 23.8* 23.4* 24.0* 23.2*  MCV 106.1* 107.2* 105.4* 105.7* 106.4*  PLT 510* 481* 440* 440* 445*    CBG: Recent Labs  Lab 05/02/21 1715 05/02/21 2018 05/02/21 2330 05/03/21 0449 05/03/21 0841  GLUCAP 147* 141* 126* 147* 138*     Scheduled Meds:  Chlorhexidine Gluconate Cloth  6 each Topical Daily   mouth rinse  15 mL Mouth Rinse q12n4p   sodium chloride flush  10-40 mL Intracatheter Q12H   Continuous Infusions:  dextrose 5 % and 0.9 % NaCl with KCl 40 mEq/L 75 mL/hr at 05/04/21 0418   HYDROmorphone 0.5 mg/hr (05/04/21 0418)    Principal Problem:   Sepsis due to methicillin resistant Staphylococcus aureus (MRSA) (HCC) Active Problems:   History of prostate cancer   Essential hypertension   CAD (coronary artery disease)   Hyperlipidemia,  mixed   Lumbar compression fracture (HCC)   Atrial fibrillation with RVR (HCC)   Sternal wound infection   SDH (subdural hematoma)   Pneumonia involving right lung   Delirium   DNR (do not resuscitate)   Goals of care, counseling/discussion   Osteomyelitis of thoracic region ([place])   Protein-calorie malnutrition, severe   Sternal osteomyelitis ([place])   Acute septic pulmonary embolism ([place])   Abdominal distention   Consultants: Infectious disease Palliative medicine team Cardiothoracic surgery Neurosurgery  Procedures: Echocardiogram Core track tube placement Central line placement Sternal wound irrigation and debridement 67/0 with application of wound VAC Sternal washout with wound VAC change 11/10  Antibiotics: Vancomycin 11/5 >>> 11/21 Zosyn 11/5 through 11/6   Time spent: 15  minutes    Erin Hearing ANP  [facility] 7 am - 330 pm/M-F for direct patient care and secure chat Please refer to Amion for contact info 16  days

## 2021-05-04 NOTE — Progress Notes (Signed)
The nurse noticed after a few hours after the start of shift that the patient was not putting out any urine while they had a foley catheter inserted and receiving continuous IV fluids.  A bladder scan was done and showed greater than 999 mL in the bladder. The patient is unresponsive but would start having full body tremors on and off.  The nurse attempted to flush/irrigated the foley catheter but the fluid would not go through.  The nurse informed the hospitalist of what was occurring and he called and asked the nurses on the foley cath team to assess the patient.  The foley nurses also attempted to irrigated the patient's foley cath, with no success.  They said the patient needed a new foley.  After the physician put in an order for a new foley insertion, two staff nurses removed the current foley and inserted a new straight 16 Fr foley catheter.  Immediately after insertion, urine started coming out but it was very dark and clots were seen.  As it started to slow down, the output became thick and resembled mud.  A urinalysis and urine culture have been collected and sent to lab.  Total output was at 1,650 mL.  The patient appears more relaxed in his sleep now.  Will continue to monitor.  Lupita Dawn, RN

## 2021-05-04 NOTE — Progress Notes (Incomplete)
Notified by Dr Darnell Level.Shalhoub to assess patient foley catheter due no urinary out,staff having difficulty flushing catheter and bladder scan results > 1000.Upon entering patient room with assigned nurse (Cary)and staff member from [facility],this patient appeared asleep, eyes closed, respiration even and unlabored on room air, coloration adequate and resting comfortable,respiation  even and unlaboredAttempts to flush cath. X3 was unsuccessful, the assigned nurse was informed and she will notify the on call/ MD for additional orders

## 2021-05-04 NOTE — Progress Notes (Addendum)
HOSPITAL MEDICINE OVERNIGHT EVENT NOTE    Notified by nursing earlier this morning the patient has been suffering from abdominal distention and discomfort for the past several days.  Nursing noticed that the patient has exhibited no urine output since the beginning of the shift.  Bladder scan was obtained revealing greater than 1000 cc in the bladder.  This severe urinary retention was felt to be secondary to a malfunctioning urinary catheter.  Nursing unfortunate was unable to flush the catheter despite multiple attempts.    Due to the patient having an indwelling Foley catheter due to history of prostate cancer, case was originally discussed with Foley catheter team and their assistance was requested.  Patient was evaluated at the bedside and due to patient's history of prostate cancer with indwelling Foley catheter they opted to not replace the catheter and asked that urology be paged.  After my discussion with Dr. Claudia Desanctis with urology I received clearance for the bedside nurse to proceed with an attempt to exchange the Foley catheter.  Nursing attempted and was able to replace the Foley catheter with a similarly sized 16 French standard Foley and was able to insert on first attempt.  This resulted in copious amounts of purulent slightly blood-tinged urine in excess of 1600 cc in total volume.  While patient is comfort measures, surely this new Foley catheter should provide some relief for the patient.  Furthermore, if urinalysis and urine culture reveal urinary tract infection patient could be initiated on antibiotic therapy at the day team's discretion.  Vernelle Emerald  MD [facility]

## 2021-05-05 DIAGNOSIS — K567 Ileus, unspecified: Secondary | ICD-10-CM

## 2021-05-05 DIAGNOSIS — I2690 Septic pulmonary embolism without acute cor pulmonale: Secondary | ICD-10-CM | POA: Diagnosis not present

## 2021-05-05 DIAGNOSIS — Z515 Encounter for palliative care: Secondary | ICD-10-CM | POA: Diagnosis not present

## 2021-05-05 DIAGNOSIS — R410 Disorientation, unspecified: Secondary | ICD-10-CM | POA: Diagnosis not present

## 2021-05-05 DIAGNOSIS — I4891 Unspecified atrial fibrillation: Secondary | ICD-10-CM | POA: Diagnosis not present

## 2021-05-05 DIAGNOSIS — E782 Mixed hyperlipidemia: Secondary | ICD-10-CM

## 2021-05-05 LAB — URINE CULTURE: Culture: >=100000 — AB

## 2021-05-05 MED ORDER — HYDROMORPHONE BOLUS VIA INFUSION
1.0000 mg | INTRAVENOUS | Status: DC | PRN
  Administered 2021-05-05 – 2021-05-06 (×2): 1 mg via INTRAVENOUS
  Filled 2021-05-05: qty 1

## 2021-05-05 MED ORDER — SCOPOLAMINE 1 MG/3DAYS TD PT72
1.0000 | MEDICATED_PATCH | TRANSDERMAL | Status: DC
  Administered 2021-05-05: 1.5 mg via TRANSDERMAL
  Filled 2021-05-05: qty 1

## 2021-05-05 MED ORDER — GLYCOPYRROLATE 0.2 MG/ML IJ SOLN
0.6000 mg | INTRAMUSCULAR | Status: DC
  Administered 2021-05-05 – 2021-05-06 (×10): 0.6 mg via INTRAVENOUS
  Filled 2021-05-05 (×8): qty 3

## 2021-05-05 NOTE — Progress Notes (Signed)
Patient is actively dying. Discussed with RN symptom management including management of secretions at EOL and infusion titration.  Lane Hacker, DO Palliative Medicine

## 2021-05-05 NOTE — Progress Notes (Signed)
PROGRESS NOTE    [name]  HYW:[id number] DOB: 13-May-1936 DOA: 04/19/2021 PCP: [name], DO     Brief Narrative:  85 y.o. male with medical history significant of HTN; HLD; remote prostate CA; CAD s/p remote CABG; and abdominal wall melanoma.24 hours prior to presentation had been discharged from [facility] with recommendation for SNF placement but spouse refused and requested patient be discharged home with 24/7 care.  He presented to [facility] on 04/17/21 with sepsis, presenting with fever, encephalopathy, respiratory distress and A-fib RVR.  Transferred to [name] on 04/30/2021 for CT surgery coverage due to a mediastinal fluid collection associated with transverse sternal fracture.  Patient found to have Staph bacteremia with findings of pulmonary septic emboli on CT scan.   Infectious disease, CT surgery, neurosurgery, palliative care consulted.  Patient's wife has not been willing to de-escalate care.  Cardiothoracic continues to follow for chest wound.  He remains on NG tube feedings through cortrak.  Poor overall prognosis.    Subjective: Patient appears comfortable.  Significant oral secretions, however patient SPO2 in the mid 90s.  Unresponsive to painful stimuli.   Assessment & Plan: Covid vaccination;   Principal Problem:   Sepsis due to methicillin resistant Staphylococcus aureus (MRSA) ([place]) Active Problems:   History of prostate cancer   Essential hypertension   CAD (coronary artery disease)   Hyperlipidemia, mixed   Lumbar compression fracture (HCC)   Atrial fibrillation with RVR (HCC)   Sternal wound infection   SDH (subdural hematoma)   Pneumonia involving right lung   Delirium   DNR (do not resuscitate)   Goals of care, counseling/discussion   Osteomyelitis of thoracic region ([name])   Protein-calorie malnutrition, severe   Sternal osteomyelitis ([place])   Acute septic pulmonary embolism (HCC)   Abdominal distention   End of life  care   MRSA bacteremia with severe sepsis POA/septic emboli Sepsis physiology resolved-source never determined-plan was to continue IV vancomycin for 6 weeks post last OR date on 11/10 but as of 11/21 patient has transitioned to comfort measures and IV antibiotics discontinued -11/23 comfort care   Goals of care Wife at bedside on 11/21.  Discussed that patient has not improved and has only worsened and the general consistence from all providers is that this patient will not recover from current illness.  This information was presented to the wife who agreed and wanted her husband to be kept comfortable but also wanted him to remain at [facility] to complete end-of-life care All unnecessary medications and treatments have been discontinued End-of-life order set initiated Low-dose Dilaudid infusion initiated with plans to uptitrate as needed for comfort -11/23 palliative care managing medication changes. -11/23 comfort care   Anterior mediastinal abscess/phlegmon w/ transverse sternal fx  Continue normal saline packing prn --11/23 comfort care   Subdural Hematoma (SDH) Patient has had recent SDH x 2, acute on chronic, seen at [facility].  This was secondary to multiple falls.  -11/23 comfort care   New onset A-fib - POA, -11/23 currently sinus tachycardia    Delirium/acute metabolic encephalopathy -48/54 comfort care   Right midlung PNA vs Septic Emboli  Imaging at admission appeared consistent with septic emboli -11/23 comfort care   Oropharyngeal dysphagia/severe protein calorie malnutrition Has been unable to maintain appropriate oral intake of nutrition.  Status overall worsened and now has transitioned to end-of-life care therefore feeding tube discontinued as have been tube feedings and nutritional supplements -11/23 comfort care all p.o. medication held  Thoracolumbar compression fractures with ? Osteomyelitis  -Titrate pain medication for comfort -11/23 comfort care    Abdominal wall melanoma   Left AC superficial thrombophlebitis  Conservative management.  No DVT is noted on imaging studies.   Macrocytic anemia No further labs will be obtained   Essential hypertension  -11/23 comfort care   Hyperlipidemia   Remote Prostate CA  Has a Foley catheter which will remain in place for comfort   Coronary artery disease s/p remote CABG              Body mass index is 24.81 kg/m.     DVT prophylaxis:  Code Status: -11/23 comfort care Family Communication:  Status is: Inpatient    Dispo: The patient is from: Home              Anticipated d/c is to:  Hospital death expected              Anticipated d/c date is: 2 days              Patient currently is not medically stable to d/c.      Consultants:  Palliative care Dr. Hilma Favors   Procedures/Significant Events:    I have personally reviewed and interpreted all radiology studies and my findings are as above.  VENTILATOR SETTINGS:    Cultures   Antimicrobials:    Devices    LINES / TUBES:      Continuous Infusions:  dextrose 5 % and 0.9 % NaCl with KCl 40 mEq/L 10 mL/hr at 05/05/21 0417   HYDROmorphone 0.5 mg/hr (05/05/21 0417)     Objective: Vitals:   05/03/21 1142 05/03/21 1938 05/04/21 1020 05/04/21 2035  BP: (!) 135/96 (!) 124/105 103/64 121/63  Pulse: 96 (!) 109 (!) 105 (!) 113  Resp: (!) 24 (!) 22 20 (!) 25  Temp: 98 F (36.7 C) (!) 97.3 F (36.3 C) 99 F (37.2 C) 99.1 F (37.3 C)  TempSrc: Axillary Axillary Axillary Axillary  SpO2: 99% 100% 100% 99%  Weight:      Height:        Intake/Output Summary (Last 24 hours) at 05/05/2021 0756 Last data filed at 05/05/2021 1607 Gross per 24 hour  Intake 703.73 ml  Output 2125 ml  Net -1421.27 ml   Filed Weights   04/30/21 0500 05/01/21 0422 05/02/21 0407  Weight: 94.8 kg 94.3 kg 83 kg    Examination:  General: Also appears comfortable, No acute respiratory distress, cachectic Eyes:  negative scleral hemorrhage, negative anisocoria, negative icterus ENT: Negative Runny nose, negative gingival bleeding, Neck:  Negative scars, masses, torticollis, lymphadenopathy, JVD Lungs: Clear to auscultation bilaterally without wheezes or crackles Cardiovascular: Sinus tachycardia without murmur gallop or rub normal S1 and S2 Abdomen: negative abdominal pain, nondistended, positive soft, bowel sounds, no rebound, no ascites, no appreciable mass Extremities: No significant cyanosis, clubbing, or edema bilateral lower extremities Skin: Negative rashes, lesions, ulcers Psychiatric: Unable to evaluate secondary to patient's altered mental status. Central nervous system: Unable to evaluate secondary to patient's altered mental status.  Unresponsive to painful stimuli. .     Data Reviewed: Care during the described time interval was provided by me .  I have reviewed this patient's available data, including medical history, events of note, physical examination, and all test results as part of my evaluation.  CBC: Recent Labs  Lab 04/29/21 1105 05/01/21 0830 05/02/21 0557 05/03/21 0450  WBC 9.8 6.7 10.0 10.3  NEUTROABS 8.3*  --   --   --  HGB 7.3* 7.2* 7.6* 7.2*  HCT 23.8* 23.4* 24.0* 23.2*  MCV 107.2* 105.4* 105.7* 106.4*  PLT 481* 440* 440* 604*   Basic Metabolic Panel: Recent Labs  Lab 04/29/21 1105 04/30/21 0153 04/30/21 2104 05/01/21 0350 05/02/21 0557 05/03/21 0450  NA 142 145  --  140 145 144  K 3.3* 3.5  --  3.1* 3.1* 3.9  CL 109 112*  --  108 117* 118*  CO2 25 27  --  24 23 19*  GLUCOSE 133* 128*  --  110* 125* 143*  BUN 24* 26*  --  18 18 33*  CREATININE 0.73 0.72  --  0.70 0.82 1.34*  CALCIUM 8.9 9.0  --  8.7* 8.6* 8.5*  MG  --   --  1.9  --   --  2.5*   GFR: Estimated Creatinine Clearance: 45.8 mL/min (A) (by C-G formula based on SCr of 1.34 mg/dL (H)). Liver Function Tests: Recent Labs  Lab 04/29/21 1105  AST 15  ALT 14  ALKPHOS 81  BILITOT 0.7   PROT 6.3*  ALBUMIN 1.9*   No results for input(s): LIPASE, AMYLASE in the last 168 hours. No results for input(s): AMMONIA in the last 168 hours. Coagulation Profile: No results for input(s): INR, PROTIME in the last 168 hours. Cardiac Enzymes: No results for input(s): CKTOTAL, CKMB, CKMBINDEX, TROPONINI in the last 168 hours. BNP (last 3 results) No results for input(s): PROBNP in the last 8760 hours. HbA1C: No results for input(s): HGBA1C in the last 72 hours. CBG: Recent Labs  Lab 05/02/21 1715 05/02/21 2018 05/02/21 2330 05/03/21 0449 05/03/21 0841  GLUCAP 147* 141* 126* 147* 138*   Lipid Profile: No results for input(s): CHOL, HDL, LDLCALC, TRIG, CHOLHDL, LDLDIRECT in the last 72 hours. Thyroid Function Tests: No results for input(s): TSH, T4TOTAL, FREET4, T3FREE, THYROIDAB in the last 72 hours. Anemia Panel: No results for input(s): VITAMINB12, FOLATE, FERRITIN, TIBC, IRON, RETICCTPCT in the last 72 hours. Sepsis Labs: Recent Labs  Lab 04/29/21 1105  LATICACIDVEN 1.0    No results found for this or any previous visit (from the past 240 hour(s)).       Radiology Studies: DG Abd 2 Views  Result Date: 05/03/2021 CLINICAL DATA:  Abdominal pain and distention EXAM: ABDOMEN - 2 VIEW COMPARISON:  Previous studies including the examination of 04/29/2021 FINDINGS: There is interval decrease in degree of colonic distention. There is no small bowel dilation. Moderate amount of stool is seen in the right colon. There is no evidence of fecal impaction in the rectosigmoid. Tip of enteric tube is seen in the distal antrum of the stomach. IMPRESSION: Nonspecific bowel gas pattern. There is significant interval decrease in gaseous distention of colon. Electronically Signed   By: Elmer Picker M.D.   On: 05/03/2021 10:04        Scheduled Meds:  Chlorhexidine Gluconate Cloth  6 each Topical Daily   mouth rinse  15 mL Mouth Rinse q12n4p   sodium chloride flush  10-40  mL Intracatheter Q12H   Continuous Infusions:  dextrose 5 % and 0.9 % NaCl with KCl 40 mEq/L 10 mL/hr at 05/05/21 0417   HYDROmorphone 0.5 mg/hr (05/05/21 0417)     LOS: 17 days    Time spent:40 min    Keanu Lesniak, Geraldo Docker, MD [facility]   If 7PM-7AM, please contact night-coverage 05/05/2021, 7:56 AM

## 2021-05-05 NOTE — Progress Notes (Signed)
Palliative Care Progress Note  Patient continues to decline and show no signs of meaningful recovery. I met patient's wife at bedside along with Erin Hearing, NP. Patient's wife understands his prognosis is poor and would like to transition him to comfort care only. We discussed the EOL process and provided reassurance that he would be cared for at this difficult time. At this point we will discontinue all medical interventions except for those directly related to his comfort. Anticipate a hospital death hours-days. His wife said goodbye to him and says she will not be able to return to the hospital because her health is also very poor-she is overwhelmed but has the support of a paid caregiver. No other family involved.Our team will continue to support.   Lane Hacker, DO Palliative Medicine   Time: 35 minutes Greater than 50%  of this time was spent counseling and coordinating care related to the above assessment and plan.

## 2021-05-06 DIAGNOSIS — Z515 Encounter for palliative care: Secondary | ICD-10-CM | POA: Diagnosis not present

## 2021-05-06 DIAGNOSIS — A412 Sepsis due to unspecified staphylococcus: Secondary | ICD-10-CM

## 2021-05-06 DIAGNOSIS — A4102 Sepsis due to Methicillin resistant Staphylococcus aureus: Secondary | ICD-10-CM | POA: Diagnosis not present

## 2021-05-06 DIAGNOSIS — A419 Sepsis, unspecified organism: Secondary | ICD-10-CM

## 2021-05-06 MED ORDER — HYDROMORPHONE BOLUS VIA INFUSION
2.0000 mg | INTRAVENOUS | Status: DC | PRN
  Administered 2021-05-06: 2 mg via INTRAVENOUS
  Filled 2021-05-06: qty 2

## 2021-05-06 MED ORDER — LORAZEPAM 2 MG/ML IJ SOLN: 1.0000 mg | INTRAMUSCULAR | Status: DC | PRN

## 2021-05-06 MED ORDER — ATROPINE SULFATE 1 % OP SOLN
2.0000 [drp] | OPHTHALMIC | Status: DC
  Administered 2021-05-06 (×3): 2 [drp] via SUBLINGUAL
  Filled 2021-05-06: qty 2

## 2021-05-06 MED ORDER — ACETAMINOPHEN 650 MG RE SUPP
650.0000 mg | Freq: Four times a day (QID) | RECTAL | Status: DC | PRN
  Administered 2021-05-06 (×2): 650 mg via RECTAL
  Filled 2021-05-06 (×2): qty 1

## 2021-05-06 MED ORDER — LORAZEPAM 2 MG/ML IJ SOLN
1.0000 mg | INTRAMUSCULAR | Status: DC
Start: 2021-05-06 — End: 2021-05-07
  Administered 2021-05-06: 1 mg via INTRAVENOUS
  Administered 2021-05-06: 2 mg via INTRAVENOUS
  Filled 2021-05-06 (×3): qty 1

## 2021-05-12 ENCOUNTER — Inpatient Hospital Stay: Payer: Medicare Other | Admitting: Infectious Diseases

## 2021-05-13 ENCOUNTER — Inpatient Hospital Stay: Payer: Medicare Other | Admitting: Infectious Diseases

## 2021-05-13 NOTE — Progress Notes (Signed)
Patient pronounced dead at 2217 by two RNs. I attempted to call wife Burman Bruington) with no response. I then called to inform second contact Westley Chandler) and he was informed of patient's passing. He will pass this along to pt's wife. As of this time, no funeral arrangements have been made. Primary RN to inform [facility] night coverage. Organ donation referral and post-mortem checklist underway.

## 2021-05-13 NOTE — Death Summary Note (Signed)
Death Summary  Caidyn Henricksen MOQ:[id number] DOB: Mar 04, 1936 DOA: 2021-05-15  PCP: [name], DO PCP/Office notified:   Admit date: 05/15/21 Date of Death: 2021-06-07  Final Diagnoses:  Principal Problem:   Sepsis due to methicillin resistant Staphylococcus aureus (MRSA) ([place]) Active Problems:   History of prostate cancer   Essential hypertension   CAD (coronary artery disease)   Hyperlipidemia, mixed   Lumbar compression fracture (HCC)   Atrial fibrillation with RVR ([place])   Sternal wound infection   SDH (subdural hematoma)   Pneumonia involving right lung   Delirium   DNR (do not resuscitate)   Goals of care, counseling/discussion   Osteomyelitis of thoracic region ([place])   Protein-calorie malnutrition, severe   Sternal osteomyelitis ([place])   Acute septic pulmonary embolism ([place])   Abdominal distention   End of life care  MRSA bacteremia with severe sepsis POA/septic emboli Sepsis physiology resolved-source never determined-plan was to continue IV vancomycin for 6 weeks post last OR date on 11/10 but as of 11/21 patient has transitioned to comfort measures and IV antibiotics discontinued -11/23 comfort care -11/24 patient has peacefully overnight 2217   Goals of care Wife at bedside on 11/21.  Discussed that patient has not improved and has only worsened and the general consistence from all providers is that this patient will not recover from current illness.  This information was presented to the wife who agreed and wanted her husband to be kept comfortable but also wanted him to remain at [facility] to complete end-of-life care All unnecessary medications and treatments have been discontinued End-of-life order set initiated Low-dose Dilaudid infusion initiated with plans to uptitrate as needed for comfort -11/23 palliative care managing medication changes. -11/23 comfort care   Anterior mediastinal abscess/phlegmon w/ transverse sternal fx  Continue  normal saline packing prn --11/23 comfort care   Subdural Hematoma (SDH) Patient has had recent SDH x 2, acute on chronic, seen at [facility].  This was secondary to multiple falls.  -11/23 comfort care   New onset A-fib - POA, -11/23 currently sinus tachycardia    Delirium/acute metabolic encephalopathy -35/46 comfort care   Right midlung PNA vs Septic Emboli  Imaging at admission appeared consistent with septic emboli -11/23 comfort care   Oropharyngeal dysphagia/severe protein calorie malnutrition Has been unable to maintain appropriate oral intake of nutrition.  Status overall worsened and now has transitioned to end-of-life care therefore feeding tube discontinued as have been tube feedings and nutritional supplements -11/23 comfort care all p.o. medication held   Thoracolumbar compression fractures with ? Osteomyelitis  -Titrate pain medication for comfort -11/23 comfort care   Abdominal wall melanoma   Left AC superficial thrombophlebitis  Conservative management.  No DVT is noted on imaging studies.   Macrocytic anemia No further labs will be obtained   Essential hypertension  -11/23 comfort care   Hyperlipidemia   Remote Prostate CA  Has a Foley catheter which will remain in place for comfort   Coronary artery disease s/p remote CABG  History of present illness:  85 y.o. male with medical history significant of HTN; HLD; remote prostate CA; CAD s/p remote CABG; and abdominal wall melanoma.24 hours prior to presentation had been discharged from [facility] with recommendation for SNF placement but spouse refused and requested patient be discharged home with 24/7 care.  He presented to [facility] on 04/17/21 with sepsis, presenting with fever, encephalopathy, respiratory distress and A-fib RVR.  Transferred to [facility] on 2021-05-15 for CT surgery coverage  due to a mediastinal fluid collection associated with transverse sternal fracture.  Patient found to have Staph bacteremia  with findings of pulmonary septic emboli on CT scan.   Infectious disease, CT surgery, neurosurgery, palliative care consulted.  Patient's wife has not been willing to de-escalate care.  Cardiothoracic continues to follow for chest wound.  He remains on NG tube feedings through cortrak.  Poor overall prognosis.   Hospital Course:  See above   Time: 2217  Signed:  Dia Crawford, MD [facility]

## 2021-05-13 NOTE — Progress Notes (Signed)
Palliative:  85 y.o. male with past medical history of HTN; HLD; remote prostate CA; CAD s/p remote CABG; dementia, recurrent falls, and abdominal wall melanoma admitted on 04/19/2021 with sepsis from [facility] - new onset fever, respiratory distress after discharging from from [facility] yesterday. Patient admitted to [facility] from 10/29-11/4 after presenting with a fall with LOC.  He was found to have an acute SDH. At [facility] ED, ED scan showed mediastinum fluid collection with gas, with a transverse fracture of sternum. PMT has been consulted to assist with goals of care conversation. Complicated and prolonged hospitalization and ultimately family have decided to transition to full comfort care.   I was called by Jeneen Rinks, RN for discomfort and assistance with symptom management. Mr. Welch is having severe respiratory distress with copious secretions. Provided suction, dilaudid bolus, increase dilaudid basal rate with some improvement. Will add Lorazepam and atropine gtts to better manage secretions. Discussed reposition and frequent bolus as needed with Jeneen Rinks, Therapist, sports. No family at bedside. Jeneen Rinks has notified family of changes and decline. Discussed with Dr. Sherral Hammers. Not stable for transfer and anticipate hospital death.   Exam: Unresponsive. Distress - tachypnea, RR 40s, Copious secretions and dyspnea with severe use of accessory muscles.   Plan: - Increase basal rate dilaudid infusion. Increase bolus frequency and dosage - PLEASE UTILIZE AS NEEDED TO ACHIEVE COMFORT.  - Lorazepam 1-2 mg IV every 4 hours scheduled and every 2 hours prn.  - Atropine gtts SL (in addition to scopolamine patch and scheduled glycopyrrolate).   25 min  Vinie Sill, NP Palliative Medicine Team Pager [phone] (Please see [email or web address] for schedule) Team Phone [phone]    Greater than 50%  of this time was spent counseling and coordinating care related to the above assessment and plan

## 2021-05-13 NOTE — Progress Notes (Signed)
Notified by RN that [name] passed away.  He has had a prolonged hospital stay and was DNR. Dr. Sherral Hammers to complete D/C summary and death certificate in am

## 2021-05-13 NOTE — Progress Notes (Signed)
PROGRESS NOTE    [name]  SFK:[id number] DOB: 12-07-1935 DOA: 04/19/2021 PCP: [name], DO     Brief Narrative:  85 y.o. male with medical history significant of HTN; HLD; remote prostate CA; CAD s/p remote CABG; and abdominal wall melanoma.24 hours prior to presentation had been discharged from [facility] with recommendation for SNF placement but spouse refused and requested patient be discharged home with 24/7 care.  He presented to [facility] on 04/17/21 with sepsis, presenting with fever, encephalopathy, respiratory distress and A-fib RVR.  Transferred to [facility] on 05/07/2021 for CT surgery coverage due to a mediastinal fluid collection associated with transverse sternal fracture.  Patient found to have Staph bacteremia with findings of pulmonary septic emboli on CT scan.   Infectious disease, CT surgery, neurosurgery, palliative care consulted.  Patient's wife has not been willing to de-escalate care.  Cardiothoracic continues to follow for chest wound.  He remains on NG tube feedings through [facility]rak.  Poor overall prognosis.    Subjective: 11/24 patient appears comfortable.  Decreased secretions.   Assessment & Plan: Covid vaccination;   Principal Problem:   Sepsis due to methicillin resistant Staphylococcus aureus (MRSA) ([place]) Active Problems:   History of prostate cancer   Essential hypertension   CAD (coronary artery disease)   Hyperlipidemia, mixed   Lumbar compression fracture (HCC)   Atrial fibrillation with RVR (HCC)   Sternal wound infection   SDH (subdural hematoma)   Pneumonia involving right lung   Delirium   DNR (do not resuscitate)   Goals of care, counseling/discussion   Osteomyelitis of thoracic region ([place])   Protein-calorie malnutrition, severe   Sternal osteomyelitis ([place])   Acute septic pulmonary embolism (HCC)   Abdominal distention   End of life care   MRSA bacteremia with severe sepsis POA/septic emboli Sepsis physiology  resolved-source never determined-plan was to continue IV vancomycin for 6 weeks post last OR date on 11/10 but as of 11/21 patient has transitioned to comfort measures and IV antibiotics discontinued -11/23 comfort care   Goals of care Wife at bedside on 11/21.  Discussed that patient has not improved and has only worsened and the general consistence from all providers is that this patient will not recover from current illness.  This information was presented to the wife who agreed and wanted her husband to be kept comfortable but also wanted him to remain at [facility] to complete end-of-life care All unnecessary medications and treatments have been discontinued End-of-life order set initiated Low-dose Dilaudid infusion initiated with plans to uptitrate as needed for comfort -11/23 palliative care managing medication changes. -11/23 comfort care   Anterior mediastinal abscess/phlegmon w/ transverse sternal fx  Continue normal saline packing prn --11/23 comfort care   Subdural Hematoma (SDH) Patient has had recent SDH x 2, acute on chronic, seen at [facility].  This was secondary to multiple falls.  -11/23 comfort care   New onset A-fib - POA, -11/23 currently sinus tachycardia    Delirium/acute metabolic encephalopathy -17/49 comfort care   Right midlung PNA vs Septic Emboli  Imaging at admission appeared consistent with septic emboli -11/23 comfort care   Oropharyngeal dysphagia/severe protein calorie malnutrition Has been unable to maintain appropriate oral intake of nutrition.  Status overall worsened and now has transitioned to end-of-life care therefore feeding tube discontinued as have been tube feedings and nutritional supplements -11/23 comfort care all p.o. medication held   Thoracolumbar compression fractures with ? Osteomyelitis  -Titrate pain medication for comfort -  11/23 comfort care   Abdominal wall melanoma   Left AC superficial thrombophlebitis  Conservative  management.  No DVT is noted on imaging studies.   Macrocytic anemia No further labs will be obtained   Essential hypertension  -11/23 comfort care   Hyperlipidemia   Remote Prostate CA  Has a Foley catheter which will remain in place for comfort   Coronary artery disease s/p remote CABG              Body mass index is 24.81 kg/m.     DVT prophylaxis:  Code Status: -11/23 comfort care Family Communication:  Status is: Inpatient    Dispo: The patient is from: Home              Anticipated d/c is to:  Hospital death expected              Anticipated d/c date is: 2 days              Patient currently is not medically stable to d/c.      Consultants:  Palliative care Dr. Hilma Favors   Procedures/Significant Events:    I have personally reviewed and interpreted all radiology studies and my findings are as above.  VENTILATOR SETTINGS:    Cultures   Antimicrobials:    Devices    LINES / TUBES:      Continuous Infusions:  dextrose 5 % and 0.9 % NaCl with KCl 40 mEq/L 10 mL/hr at 2021-05-12 0222   HYDROmorphone 0.5 mg/hr (05/05/21 0417)     Objective: Vitals:   05/05/21 1300 05-12-2021 0407 2021/05/12 0507 May 12, 2021 0814  BP:  106/67  106/60  Pulse:  (!) 123  (!) 131  Resp:  (!) 21  (!) 23  Temp:  (!) 103.6 F (39.8 C) 99.5 F (37.5 C) 99.6 F (37.6 C)  TempSrc:  Oral  Axillary  SpO2: 93% 91%  (!) 79%  Weight:      Height:        Intake/Output Summary (Last 24 hours) at May 12, 2021 7342 Last data filed at 05/12/21 0411 Gross per 24 hour  Intake 122.42 ml  Output 900 ml  Net -777.58 ml    Filed Weights   04/30/21 0500 05/01/21 0422 05/02/21 0407  Weight: 94.8 kg 94.3 kg 83 kg    Examination:  General: Also appears comfortable, No acute respiratory distress, cachectic Eyes: negative scleral hemorrhage, negative anisocoria, negative icterus ENT: Negative Runny nose, negative gingival bleeding, Neck:  Negative scars, masses,  torticollis, lymphadenopathy, JVD Lungs: Clear to auscultation bilaterally without wheezes or crackles Cardiovascular: Sinus tachycardia without murmur gallop or rub normal S1 and S2 Abdomen: negative abdominal pain, nondistended, positive soft, bowel sounds, no rebound, no ascites, no appreciable mass Extremities: No significant cyanosis, clubbing, or edema bilateral lower extremities Skin: Negative rashes, lesions, ulcers Psychiatric: Unable to evaluate secondary to patient's altered mental status. Central nervous system: Unable to evaluate secondary to patient's altered mental status.  Unresponsive to painful stimuli. .     Data Reviewed: Care during the described time interval was provided by me .  I have reviewed this patient's available data, including medical history, events of note, physical examination, and all test results as part of my evaluation.  CBC: Recent Labs  Lab 04/29/21 1105 05/01/21 0830 05/02/21 0557 05/03/21 0450  WBC 9.8 6.7 10.0 10.3  NEUTROABS 8.3*  --   --   --   HGB 7.3* 7.2* 7.6* 7.2*  HCT 23.8* 23.4* 24.0* 23.2*  MCV 107.2* 105.4* 105.7* 106.4*  PLT 481* 440* 440* 445*    Basic Metabolic Panel: Recent Labs  Lab 04/29/21 1105 04/30/21 0153 04/30/21 2104 05/01/21 0350 05/02/21 0557 05/03/21 0450  NA 142 145  --  140 145 144  K 3.3* 3.5  --  3.1* 3.1* 3.9  CL 109 112*  --  108 117* 118*  CO2 25 27  --  24 23 19*  GLUCOSE 133* 128*  --  110* 125* 143*  BUN 24* 26*  --  18 18 33*  CREATININE 0.73 0.72  --  0.70 0.82 1.34*  CALCIUM 8.9 9.0  --  8.7* 8.6* 8.5*  MG  --   --  1.9  --   --  2.5*    GFR: Estimated Creatinine Clearance: 45.8 mL/min (A) (by C-G formula based on SCr of 1.34 mg/dL (H)). Liver Function Tests: Recent Labs  Lab 04/29/21 1105  AST 15  ALT 14  ALKPHOS 81  BILITOT 0.7  PROT 6.3*  ALBUMIN 1.9*    No results for input(s): LIPASE, AMYLASE in the last 168 hours. No results for input(s): AMMONIA in the last 168  hours. Coagulation Profile: No results for input(s): INR, PROTIME in the last 168 hours. Cardiac Enzymes: No results for input(s): CKTOTAL, CKMB, CKMBINDEX, TROPONINI in the last 168 hours. BNP (last 3 results) No results for input(s): PROBNP in the last 8760 hours. HbA1C: No results for input(s): HGBA1C in the last 72 hours. CBG: Recent Labs  Lab 05/02/21 1715 05/02/21 2018 05/02/21 2330 05/03/21 0449 05/03/21 0841  GLUCAP 147* 141* 126* 147* 138*    Lipid Profile: No results for input(s): CHOL, HDL, LDLCALC, TRIG, CHOLHDL, LDLDIRECT in the last 72 hours. Thyroid Function Tests: No results for input(s): TSH, T4TOTAL, FREET4, T3FREE, THYROIDAB in the last 72 hours. Anemia Panel: No results for input(s): VITAMINB12, FOLATE, FERRITIN, TIBC, IRON, RETICCTPCT in the last 72 hours. Sepsis Labs: Recent Labs  Lab 04/29/21 1105  LATICACIDVEN 1.0     Recent Results (from the past 240 hour(s))  Urine Culture     Status: Abnormal   Collection Time: 05/04/21  3:32 AM   Specimen: Urine, Catheterized  Result Value Ref Range Status   Specimen Description URINE, CATHETERIZED  Final   Special Requests   Final    NONE Performed at [facility], [address] [place],  [address]    Culture >=100,000 COLONIES/mL PROTEUS MIRABILIS (A)  Final   Report Status 05/05/2021 FINAL  Final   Organism ID, Bacteria PROTEUS MIRABILIS (A)  Final      Susceptibility   Proteus mirabilis - MIC*    AMPICILLIN <=2 SENSITIVE Sensitive     CEFAZOLIN <=4 SENSITIVE Sensitive     CEFEPIME <=0.12 SENSITIVE Sensitive     CEFTRIAXONE <=0.25 SENSITIVE Sensitive     CIPROFLOXACIN <=0.25 SENSITIVE Sensitive     GENTAMICIN <=1 SENSITIVE Sensitive     IMIPENEM 2 SENSITIVE Sensitive     NITROFURANTOIN 128 RESISTANT Resistant     TRIMETH/SULFA <=20 SENSITIVE Sensitive     AMPICILLIN/SULBACTAM <=2 SENSITIVE Sensitive     PIP/TAZO <=4 SENSITIVE Sensitive     * >=100,000 COLONIES/mL PROTEUS  MIRABILIS         Radiology Studies: No results found.      Scheduled Meds:  atropine  2 drop Sublingual Q4H   glycopyrrolate  0.6 mg Intravenous Q4H   LORazepam  1-2 mg Intravenous Q4H   mouth rinse  15 mL  Mouth Rinse q12n4p   scopolamine  1 patch Transdermal Q72H   sodium chloride flush  10-40 mL Intracatheter Q12H   Continuous Infusions:  dextrose 5 % and 0.9 % NaCl with KCl 40 mEq/L 10 mL/hr at Jun 05, 2021 0222   HYDROmorphone 0.5 mg/hr (05/05/21 0417)     LOS: 18 days    Time spent:40 min    Rodrigo Mcgranahan, Geraldo Docker, MD [facility]   If 7PM-7AM, please contact night-coverage 06/05/2021, 9:38 AM

## 2021-05-13 DEATH — deceased

## 2021-05-14 ENCOUNTER — Inpatient Hospital Stay: Payer: Medicare Other | Admitting: Infectious Diseases

## 2022-01-18 ENCOUNTER — Ambulatory Visit: Payer: Medicare Other

## 2022-08-05 IMAGING — DX DG CHEST 1V PORT
1 series · 1 of 1 positions shown · non-contrast
Comparison: 09/15/2019.

CLINICAL DATA: Left-sided rib pain. Lower leg swelling and
cellulitis.

EXAM:
PORTABLE CHEST 1 VIEW

[chest ap]
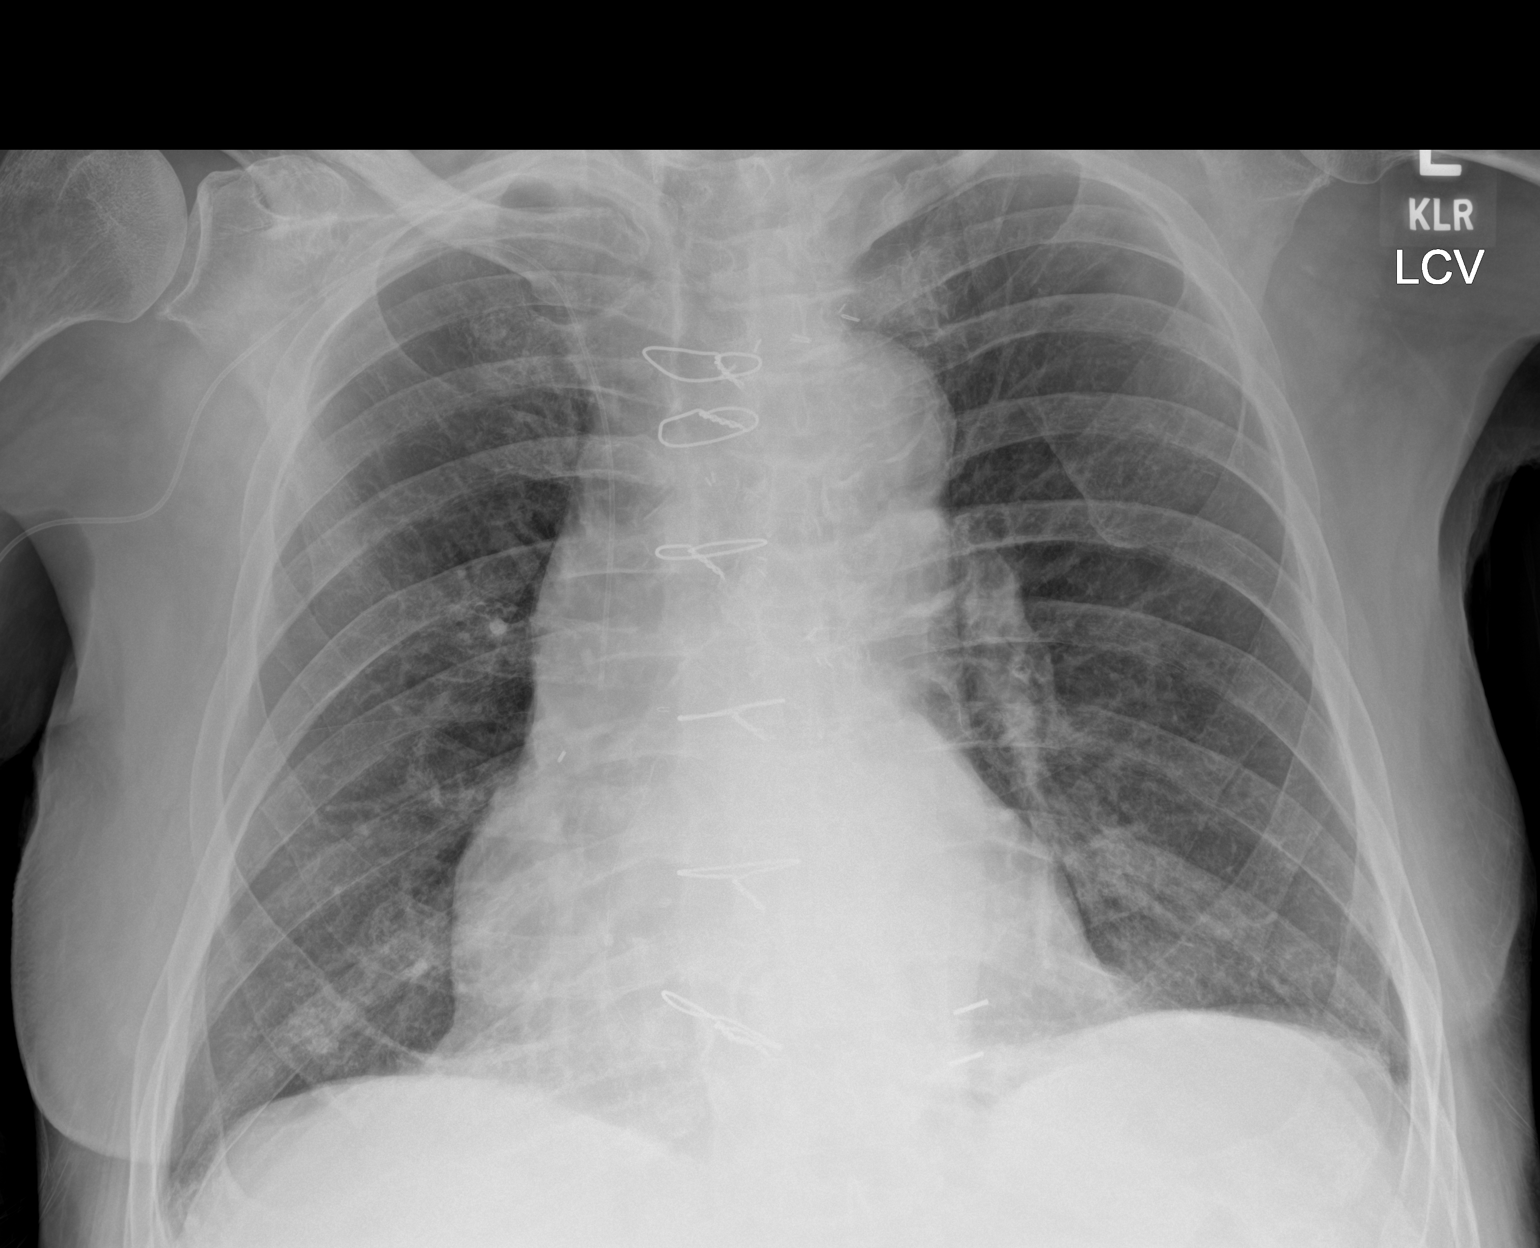

[1 of 1 positions shown; findings below may reference images not displayed]

FINDINGS: Trachea is midline. Right PICC tip is in the SVC. Heart is enlarged,
unchanged. Thoracic aorta is calcified. Lungs are clear. There may
be tiny bilateral pleural effusions.
IMPRESSION: 1. Question tiny bilateral pleural effusions.
2.  Aortic atherosclerosis ([id number]).

## 2023-01-22 IMAGING — DX DG HIP (WITH OR WITHOUT PELVIS) 2-3V*R*
4 series · 4 of 4 positions shown · non-contrast
Comparison: None.

CLINICAL DATA: Status post fall with right hip pain. Fall in [REDACTED]
with persistent right hip pain since that time.

EXAM:
DG HIP (WITH OR WITHOUT PELVIS) 2-3V RIGHT

[pelvis ap (1 of 2)]
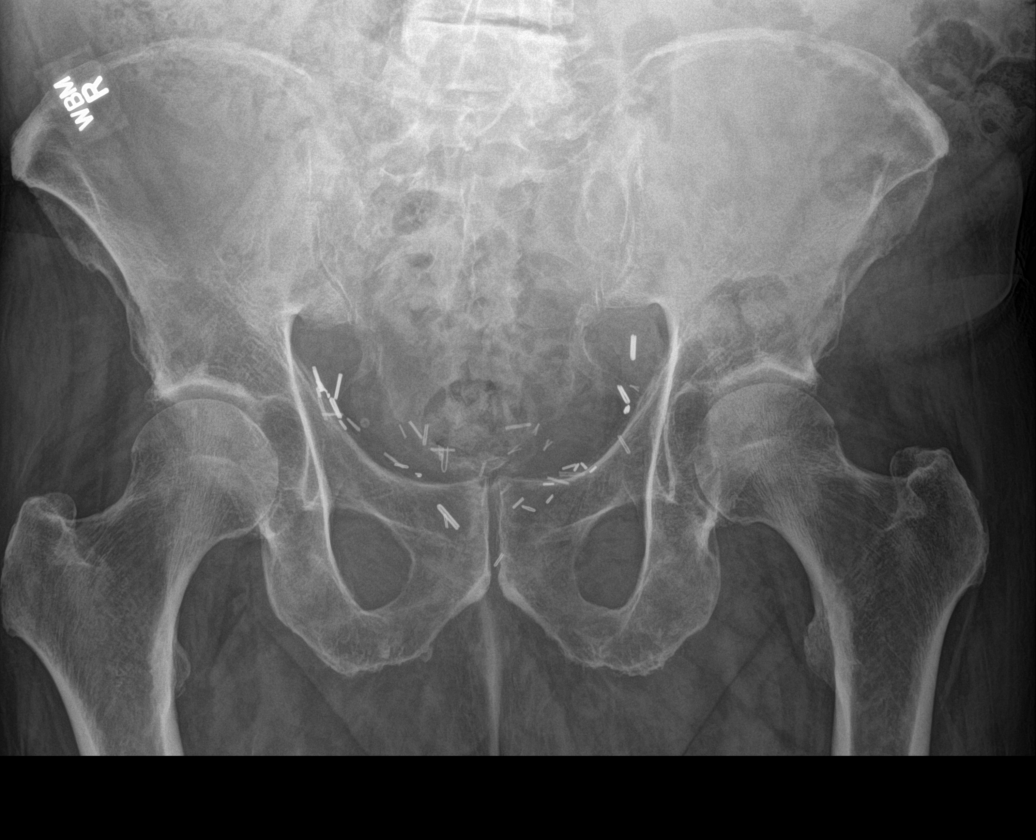

[hip ap]
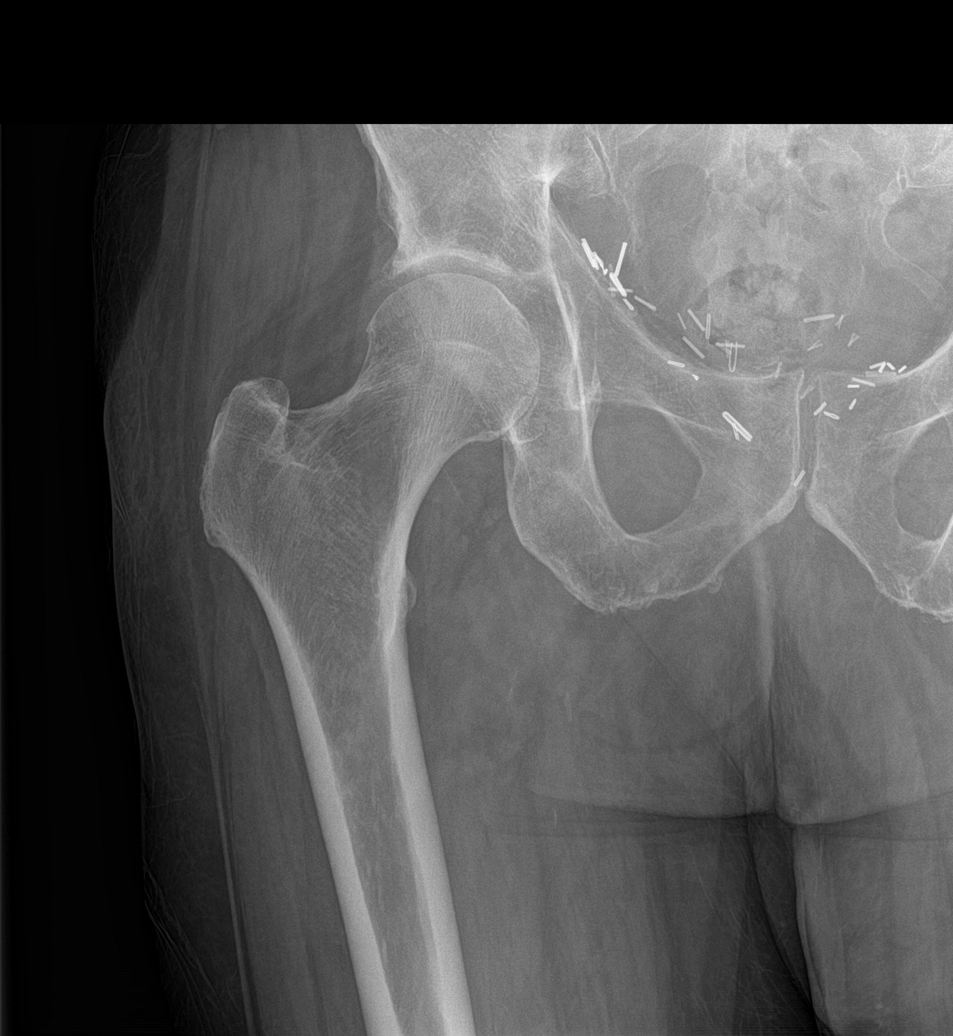

[hip lat]
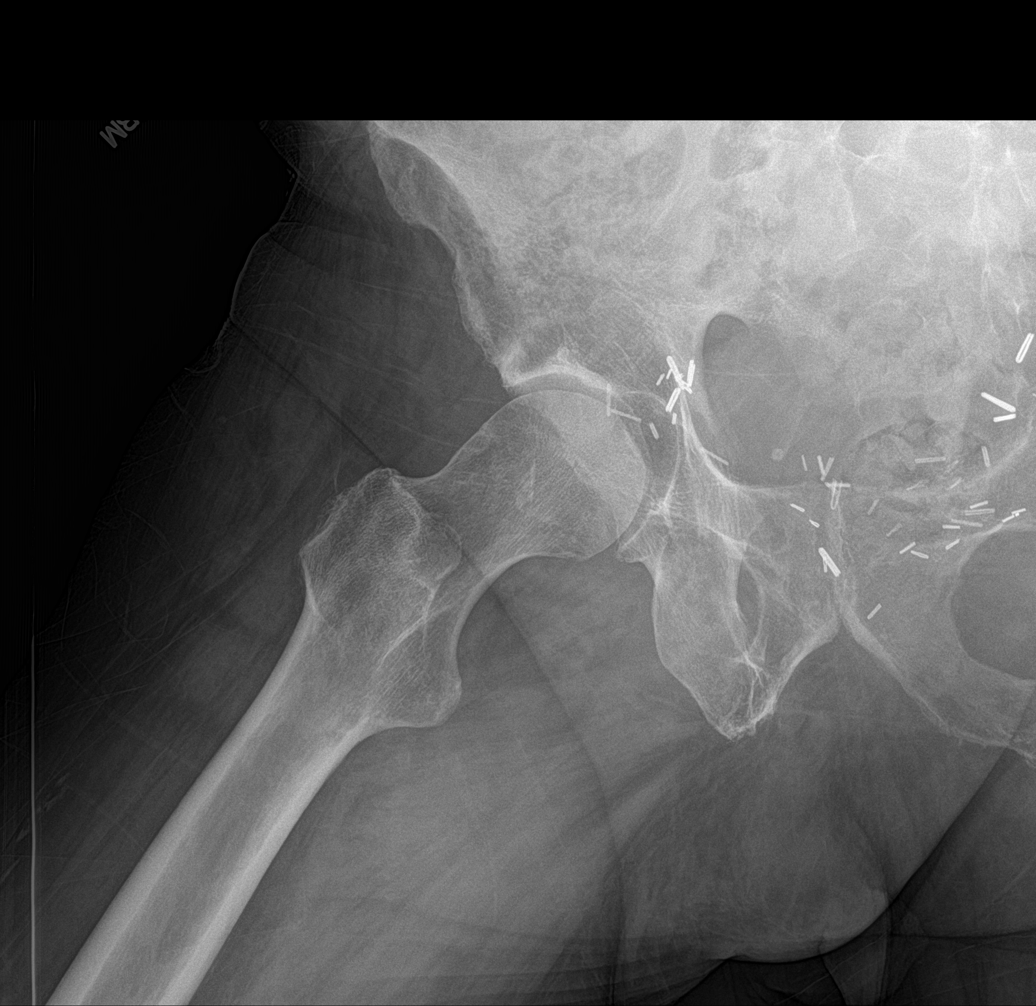

[pelvis ap (2 of 2)]
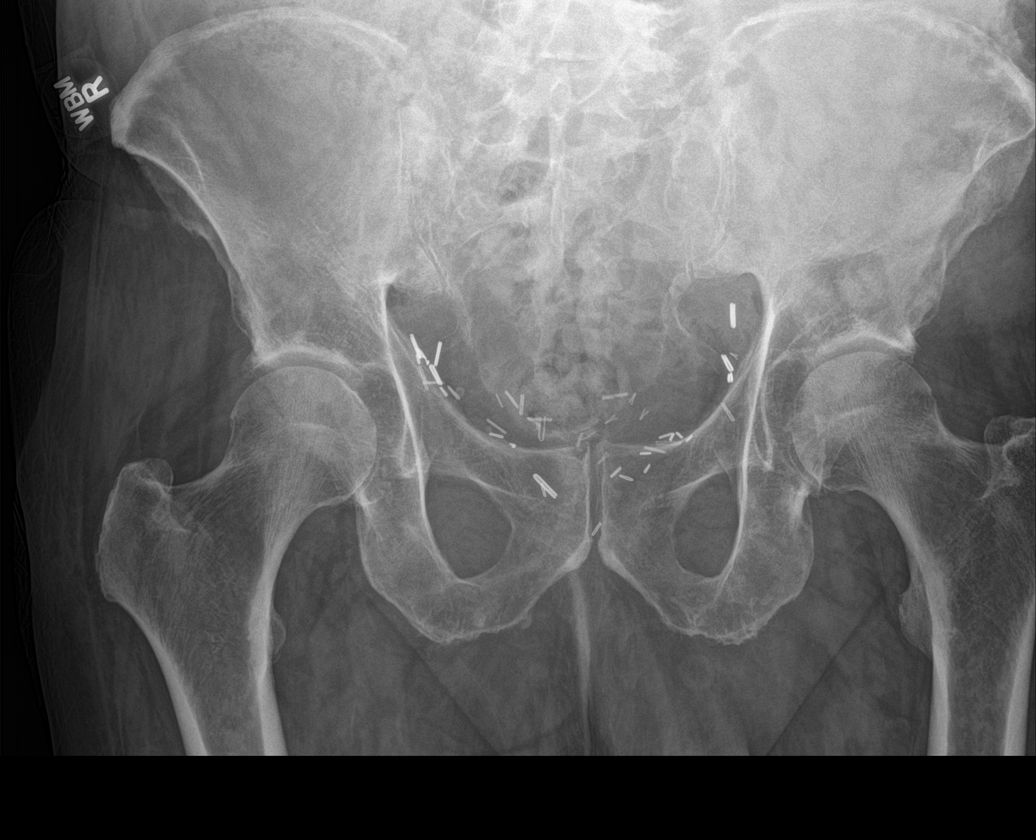

[4 of 4 positions shown; findings below may reference images not displayed]

FINDINGS: No acute or healing fracture. The femoral head is well seated in the
acetabulum. There is mild lateral acetabular spurring. No avascular
necrosis. Intact pubic rami. Pubic symphysis and sacroiliac joints
are congruent. No focal bone lesion. Surgical clips in the pelvis
from prostatectomy.
IMPRESSION: Mild osteoarthritis of the right hip. No acute or healing fracture.
